# Patient Record
Sex: Female | Born: 1980 | Race: Black or African American | Hispanic: No | Smoking: Never smoker
Health system: Southern US, Community
[De-identification: ages and names within clinical notes are randomized; demographics above are authoritative.]

## PROBLEM LIST (undated history)

## (undated) DIAGNOSIS — I890 Lymphedema, not elsewhere classified: Secondary | ICD-10-CM

## (undated) DIAGNOSIS — I10 Essential (primary) hypertension: Secondary | ICD-10-CM

## (undated) HISTORY — PX: NO PAST SURGERIES: SHX2092

---

## 1998-07-01 ENCOUNTER — Emergency Department (HOSPITAL_COMMUNITY): Admission: EM | Admit: 1998-07-01 | Discharge: 1998-07-01 | Payer: Self-pay | Admitting: Emergency Medicine

## 1998-07-10 ENCOUNTER — Emergency Department (HOSPITAL_COMMUNITY): Admission: EM | Admit: 1998-07-10 | Discharge: 1998-07-10 | Payer: Self-pay | Admitting: Emergency Medicine

## 2001-02-06 ENCOUNTER — Emergency Department (HOSPITAL_COMMUNITY): Admission: EM | Admit: 2001-02-06 | Discharge: 2001-02-06 | Payer: Self-pay | Admitting: Emergency Medicine

## 2001-02-06 ENCOUNTER — Encounter: Payer: Self-pay | Admitting: Emergency Medicine

## 2003-02-24 ENCOUNTER — Encounter: Admission: RE | Admit: 2003-02-24 | Discharge: 2003-02-24 | Payer: Self-pay | Admitting: Internal Medicine

## 2003-02-24 ENCOUNTER — Ambulatory Visit (HOSPITAL_COMMUNITY): Admission: RE | Admit: 2003-02-24 | Discharge: 2003-02-24 | Payer: Self-pay | Admitting: Internal Medicine

## 2003-02-24 ENCOUNTER — Encounter: Payer: Self-pay | Admitting: Internal Medicine

## 2003-07-16 ENCOUNTER — Other Ambulatory Visit: Admission: RE | Admit: 2003-07-16 | Discharge: 2003-07-16 | Payer: Self-pay | Admitting: Obstetrics and Gynecology

## 2003-12-24 ENCOUNTER — Emergency Department (HOSPITAL_COMMUNITY): Admission: EM | Admit: 2003-12-24 | Discharge: 2003-12-24 | Payer: Self-pay | Admitting: Emergency Medicine

## 2004-10-12 ENCOUNTER — Emergency Department (HOSPITAL_COMMUNITY): Admission: EM | Admit: 2004-10-12 | Discharge: 2004-10-12 | Payer: Self-pay | Admitting: Emergency Medicine

## 2004-10-15 ENCOUNTER — Emergency Department (HOSPITAL_COMMUNITY): Admission: EM | Admit: 2004-10-15 | Discharge: 2004-10-15 | Payer: Self-pay | Admitting: Emergency Medicine

## 2004-10-28 ENCOUNTER — Ambulatory Visit: Payer: Self-pay | Admitting: Family Medicine

## 2005-04-21 ENCOUNTER — Emergency Department (HOSPITAL_COMMUNITY): Admission: EM | Admit: 2005-04-21 | Discharge: 2005-04-21 | Payer: Self-pay | Admitting: Emergency Medicine

## 2005-12-30 ENCOUNTER — Emergency Department (HOSPITAL_COMMUNITY): Admission: EM | Admit: 2005-12-30 | Discharge: 2005-12-30 | Payer: Self-pay | Admitting: *Deleted

## 2006-04-13 ENCOUNTER — Emergency Department (HOSPITAL_COMMUNITY): Admission: EM | Admit: 2006-04-13 | Discharge: 2006-04-13 | Payer: Self-pay | Admitting: Emergency Medicine

## 2006-05-31 ENCOUNTER — Inpatient Hospital Stay (HOSPITAL_COMMUNITY): Admission: AD | Admit: 2006-05-31 | Discharge: 2006-05-31 | Payer: Self-pay | Admitting: Obstetrics & Gynecology

## 2008-01-14 ENCOUNTER — Emergency Department (HOSPITAL_COMMUNITY): Admission: EM | Admit: 2008-01-14 | Discharge: 2008-01-14 | Payer: Self-pay | Admitting: Emergency Medicine

## 2008-01-31 ENCOUNTER — Emergency Department (HOSPITAL_COMMUNITY): Admission: EM | Admit: 2008-01-31 | Discharge: 2008-02-01 | Payer: Self-pay | Admitting: Emergency Medicine

## 2008-04-23 ENCOUNTER — Emergency Department (HOSPITAL_COMMUNITY): Admission: EM | Admit: 2008-04-23 | Discharge: 2008-04-23 | Payer: Self-pay | Admitting: Family Medicine

## 2009-02-03 ENCOUNTER — Encounter: Admission: RE | Admit: 2009-02-03 | Discharge: 2009-02-03 | Payer: Self-pay | Admitting: Pulmonary Disease

## 2010-09-01 ENCOUNTER — Emergency Department (HOSPITAL_COMMUNITY)
Admission: EM | Admit: 2010-09-01 | Discharge: 2010-09-01 | Payer: Self-pay | Source: Home / Self Care | Admitting: Emergency Medicine

## 2011-02-01 LAB — URINALYSIS, ROUTINE W REFLEX MICROSCOPIC
Bilirubin Urine: NEGATIVE
Glucose, UA: NEGATIVE mg/dL
Ketones, ur: NEGATIVE mg/dL
Nitrite: NEGATIVE
Protein, ur: NEGATIVE mg/dL
Specific Gravity, Urine: 1.025 (ref 1.005–1.030)
Urobilinogen, UA: 1 mg/dL (ref 0.0–1.0)
pH: 6 (ref 5.0–8.0)

## 2011-02-01 LAB — URINE MICROSCOPIC-ADD ON

## 2011-05-02 ENCOUNTER — Emergency Department (HOSPITAL_COMMUNITY): Payer: Self-pay

## 2011-05-02 ENCOUNTER — Emergency Department (HOSPITAL_COMMUNITY)
Admission: EM | Admit: 2011-05-02 | Discharge: 2011-05-02 | Disposition: A | Discharge status: Home / Self Care | Payer: Self-pay | Attending: Emergency Medicine | Admitting: Emergency Medicine

## 2011-05-02 DIAGNOSIS — M25473 Effusion, unspecified ankle: Secondary | ICD-10-CM | POA: Insufficient documentation

## 2011-05-02 DIAGNOSIS — M25476 Effusion, unspecified foot: Secondary | ICD-10-CM | POA: Insufficient documentation

## 2011-05-02 DIAGNOSIS — M25579 Pain in unspecified ankle and joints of unspecified foot: Secondary | ICD-10-CM | POA: Insufficient documentation

## 2011-05-02 DIAGNOSIS — M79609 Pain in unspecified limb: Secondary | ICD-10-CM | POA: Insufficient documentation

## 2011-05-02 DIAGNOSIS — L988 Other specified disorders of the skin and subcutaneous tissue: Secondary | ICD-10-CM | POA: Insufficient documentation

## 2011-08-14 LAB — RAPID STREP SCREEN (MED CTR MEBANE ONLY): Streptococcus, Group A Screen (Direct): NEGATIVE

## 2012-02-28 ENCOUNTER — Emergency Department (HOSPITAL_COMMUNITY)
Admission: EM | Admit: 2012-02-28 | Discharge: 2012-02-28 | Disposition: A | Discharge status: Home / Self Care | Payer: Self-pay | Attending: Emergency Medicine | Admitting: Emergency Medicine

## 2012-02-28 ENCOUNTER — Encounter (HOSPITAL_COMMUNITY): Payer: Self-pay | Admitting: *Deleted

## 2012-02-28 DIAGNOSIS — I10 Essential (primary) hypertension: Secondary | ICD-10-CM | POA: Insufficient documentation

## 2012-02-28 DIAGNOSIS — R51 Headache: Secondary | ICD-10-CM | POA: Insufficient documentation

## 2012-02-28 MED ORDER — HYDROCHLOROTHIAZIDE 25 MG PO TABS: 25.0000 mg | ORAL_TABLET | Freq: Every day | ORAL | Status: DC

## 2012-02-28 NOTE — ED Notes (Signed)
Unable to get labs. RN to attempt.

## 2012-02-28 NOTE — ED Notes (Signed)
Pt states "high blood pressure runs in my family, I was having like pressure in my forehead and dizziness"; pt denies dizziness @ present.

## 2012-02-28 NOTE — ED Notes (Addendum)
Pt labs shown to F.C. Sanford PA.

## 2012-02-28 NOTE — Discharge Instructions (Signed)
Hypertension Information As your heart beats, it forces blood through your arteries. This force is your blood pressure. If the pressure is too high, it is called hypertension (HTN) or high blood pressure. HTN is dangerous because you may have it and not know it. High blood pressure may mean that your heart has to work harder to pump blood. Your arteries may be narrow or stiff. The extra work puts you at risk for heart disease, stroke, and other problems.  Blood pressure consists of two numbers, a higher number over a lower, 110/72, for example. It is stated as "110 over 72." The ideal is below 120 for the top number (systolic) and under 80 for the bottom (diastolic).  You should pay close attention to your blood pressure if you have certain conditions such as:  Heart failure.   Prior heart attack.   Diabetes   Chronic kidney disease.   Prior stroke.   Multiple risk factors for heart disease.  To see if you have HTN, your blood pressure should be measured while you are seated with your arm held at the level of the heart. It should be measured at least twice. A one-time elevated blood pressure reading (especially in the Emergency Department) does not mean that you need treatment. There may be conditions in which the blood pressure is different between your right and left arms. It is important to see your caregiver soon for a recheck. Most people have essential hypertension which means that there is not a specific cause. This type of high blood pressure may be lowered by changing lifestyle factors such as:  Stress.   Smoking.   Lack of exercise.   Excessive weight.   Drug/tobacco/alcohol use.   Eating less salt.  Most people do not have symptoms from high blood pressure until it has caused damage to the body. Effective treatment can often prevent, delay or reduce that damage. TREATMENT  Treatment for high blood pressure, when a cause has been identified, is directed at the cause. There  are a large number of medications to treat HTN. These fall into several categories, and your caregiver will help you select the medicines that are best for you. Medications may have side effects. You should review side effects with your caregiver. If your blood pressure stays high after you have made lifestyle changes or started on medicines,   Your medication(s) may need to be changed.   Other problems may need to be addressed.   Be certain you understand your prescriptions, and know how and when to take your medicine.   Be sure to follow up with your caregiver within the time frame advised (usually within two weeks) to have your blood pressure rechecked and to review your medications.   If you are taking more than one medicine to lower your blood pressure, make sure you know how and at what times they should be taken. Taking two medicines at the same time can result in blood pressure that is too low.  Document Released: 01/09/2006 Document Revised: 07/19/2011 Document Reviewed: 01/16/2008 ExitCare Patient Information 2012 [employer]. 

## 2012-02-29 LAB — POCT I-STAT, CHEM 8
BUN: 13 mg/dL (ref 6–23)
Calcium, Ion: 1.01 mmol/L — ABNORMAL LOW (ref 1.12–1.32)
Chloride: 106 mEq/L (ref 96–112)
Creatinine, Ser: 0.8 mg/dL (ref 0.50–1.10)
Glucose, Bld: 81 mg/dL (ref 70–99)
HCT: 43 % (ref 36.0–46.0)
Hemoglobin: 14.6 g/dL (ref 12.0–15.0)
Potassium: 7.8 mEq/L (ref 3.5–5.1)
Sodium: 137 mEq/L (ref 135–145)
TCO2: 28 mmol/L (ref 0–100)

## 2012-02-29 NOTE — ED Provider Notes (Signed)
History     CSN: [id number]  Arrival date & time 02/28/12  1654   First MD Initiated Contact with Patient 02/28/12 1956      Chief Complaint  Patient presents with  . Headache    (Consider location/radiation/quality/duration/timing/severity/associated sxs/prior treatment) HPI Comments: Patient with a family history of HTN presents with concerns for elevated blood pressure - states that for the past week or so she has been having occasional episodes of dizziness and headache - none of these are present now - she states that she has an appointment with [facility] at [facility] in the next two weeks but became concerned today because of this - she denies chest pain, shortness of breath, - states no other history besides morbid obesity.  Patient is a 31 y.o. female presenting with headaches. The history is provided by the patient. No language interpreter was used.  Headache  This is a new problem. The current episode started 2 days ago. The problem occurs every few hours. The problem has been resolved. The headache is associated with nothing. The pain is located in the frontal region. The pain is at a severity of 0/10. The patient is experiencing no pain. The pain does not radiate. Pertinent negatives include no anorexia, no fever, no malaise/fatigue, no chest pressure, no near-syncope, no orthopnea, no palpitations, no syncope, no shortness of breath, no nausea and no vomiting. She has tried nothing for the symptoms. The treatment provided no relief.    History reviewed. No pertinent past medical history.  History reviewed. No pertinent past surgical history.  No family history on file.  History  Substance Use Topics  . Smoking status: Never Smoker   . Smokeless tobacco: Not on file  . Alcohol Use: No    OB History    Grav Para Term Preterm Abortions TAB SAB Ect Mult Living                  Review of Systems  Constitutional: Negative for fever and malaise/fatigue.  Respiratory: Negative  for shortness of breath.   Cardiovascular: Negative for palpitations, orthopnea, syncope and near-syncope.  Gastrointestinal: Negative for nausea, vomiting and anorexia.  Neurological: Positive for headaches.  All other systems reviewed and are negative.    Allergies  Review of patient's allergies indicates no known allergies.  Home Medications   Current Outpatient Rx  Name Route Sig Dispense Refill  . IBUPROFEN 200 MG PO TABS Oral Take 600 mg by mouth every 8 (eight) hours as needed. For pain.    Marland Kitchen HYDROCHLOROTHIAZIDE 25 MG PO TABS Oral Take 1 tablet (25 mg total) by mouth daily. 30 tablet 0    BP 147/99  Pulse 85  Temp(Src) 98.3 F (36.8 C) (Oral)  Resp 16  Wt 486 lb (220.448 kg)  SpO2 96%  LMP 02/02/2012  Physical Exam  Nursing note and vitals reviewed. Constitutional: She is oriented to person, place, and time. She appears well-developed and well-nourished. No distress.  HENT:  Head: Normocephalic and atraumatic.  Right Ear: External ear normal.  Left Ear: External ear normal.  Nose: Nose normal.  Mouth/Throat: Oropharynx is clear and moist. No oropharyngeal exudate.  Eyes: Conjunctivae are normal. Pupils are equal, round, and reactive to light. No scleral icterus.  Neck: Normal range of motion. Neck supple.  Cardiovascular: Normal rate, regular rhythm and normal heart sounds.  Exam reveals no gallop and no friction rub.   No murmur heard. Pulmonary/Chest: Effort normal and breath sounds normal. No respiratory distress. She  has no wheezes. She has no rales. She exhibits no tenderness.  Abdominal: Soft. Bowel sounds are normal. She exhibits no distension. There is no tenderness.  Musculoskeletal: Normal range of motion. She exhibits no edema and no tenderness.  Lymphadenopathy:    She has no cervical adenopathy.  Neurological: She is alert and oriented to person, place, and time. No cranial nerve deficit.  Skin: Skin is warm and dry. No rash noted. No erythema. No  pallor.  Psychiatric: She has a normal mood and affect. Her behavior is normal. Judgment and thought content normal.    ED Course  Procedures (including critical care time)  Labs Reviewed  POCT I-STAT, CHEM 8 - Abnormal; Notable for the following:    Potassium 7.8 (*)    Calcium, Ion 1.01 (*)    All other components within normal limits  POTASSIUM   No results found. Results for orders placed during the hospital encounter of 02/28/12  POCT I-STAT, CHEM 8      Component Value Range   Sodium 137  135 - 145 (mEq/L)   Potassium 7.8 (*) 3.5 - 5.1 (mEq/L)   Chloride 106  96 - 112 (mEq/L)   BUN 13  6 - 23 (mg/dL)   Creatinine, Ser 5.78  0.50 - 1.10 (mg/dL)   Glucose, Bld 81  70 - 99 (mg/dL)   Calcium, Ion 4.69 (*) 1.12 - 1.32 (mmol/L)   TCO2 28  0 - 100 (mmol/L)   Hemoglobin 14.6  12.0 - 15.0 (g/dL)   HCT 62.9  52.8 - 41.3 (%)   Comment NOTIFIED PHYSICIAN    POTASSIUM      Component Value Range   Potassium 4.0  3.5 - 5.1 (mEq/L)   No results found.    1. Hypertension       MDM  Patient with hypertension here after having headaches and dizziness.  I have drawn a baseline BMP - the first one drawn showed a K of 7.8 but this was likely hemolyzed so we repeated this with a normal number.  As she has follow up with the [facility] in several weeks, I have started her on HCTZ and they can adjust this if needed.  Blood glucose here was normal though the patient is morbidly obese.  She is strongly encouraged to keep her appointment with them for regular health mainenance.        Izola Price [place], [place] 02/29/12 [phone]

## 2012-02-29 NOTE — ED Provider Notes (Signed)
Medical screening examination/treatment/procedure(s) were performed by non-physician practitioner and as supervising physician I was immediately available for consultation/collaboration.   Reilley Latorre, MD 02/29/12 1654 

## 2012-09-20 ENCOUNTER — Emergency Department (HOSPITAL_COMMUNITY)
Admission: EM | Admit: 2012-09-20 | Discharge: 2012-09-20 | Discharge status: Against Medical Advice | Payer: Self-pay | Attending: Emergency Medicine | Admitting: Emergency Medicine

## 2012-09-20 DIAGNOSIS — R109 Unspecified abdominal pain: Secondary | ICD-10-CM | POA: Insufficient documentation

## 2012-09-20 DIAGNOSIS — R1110 Vomiting, unspecified: Secondary | ICD-10-CM | POA: Insufficient documentation

## 2013-01-15 ENCOUNTER — Inpatient Hospital Stay (HOSPITAL_COMMUNITY)
Admission: AD | Admit: 2013-01-15 | Discharge: 2013-01-15 | Disposition: A | Discharge status: Home / Self Care | Payer: Self-pay | Source: Ambulatory Visit | Attending: Obstetrics & Gynecology | Admitting: Obstetrics & Gynecology

## 2013-01-15 ENCOUNTER — Encounter (HOSPITAL_COMMUNITY): Payer: Self-pay

## 2013-01-15 DIAGNOSIS — N390 Urinary tract infection, site not specified: Secondary | ICD-10-CM | POA: Insufficient documentation

## 2013-01-15 DIAGNOSIS — N949 Unspecified condition associated with female genital organs and menstrual cycle: Secondary | ICD-10-CM | POA: Insufficient documentation

## 2013-01-15 DIAGNOSIS — N760 Acute vaginitis: Secondary | ICD-10-CM

## 2013-01-15 DIAGNOSIS — B9689 Other specified bacterial agents as the cause of diseases classified elsewhere: Secondary | ICD-10-CM

## 2013-01-15 DIAGNOSIS — A499 Bacterial infection, unspecified: Secondary | ICD-10-CM | POA: Insufficient documentation

## 2013-01-15 DIAGNOSIS — M545 Low back pain, unspecified: Secondary | ICD-10-CM | POA: Insufficient documentation

## 2013-01-15 HISTORY — DX: Essential (primary) hypertension: I10

## 2013-01-15 LAB — URINALYSIS, ROUTINE W REFLEX MICROSCOPIC
Bilirubin Urine: NEGATIVE
Glucose, UA: NEGATIVE mg/dL
Ketones, ur: NEGATIVE mg/dL
Leukocytes, UA: NEGATIVE
Nitrite: NEGATIVE
Protein, ur: NEGATIVE mg/dL
Specific Gravity, Urine: 1.01 (ref 1.005–1.030)
Urobilinogen, UA: 0.2 mg/dL (ref 0.0–1.0)
pH: 6 (ref 5.0–8.0)

## 2013-01-15 LAB — URINE MICROSCOPIC-ADD ON

## 2013-01-15 LAB — WET PREP, GENITAL
Trich, Wet Prep: NONE SEEN
Yeast Wet Prep HPF POC: NONE SEEN

## 2013-01-15 LAB — POCT PREGNANCY, URINE: Preg Test, Ur: NEGATIVE

## 2013-01-15 MED ORDER — METRONIDAZOLE 500 MG PO TABS: 500.0000 mg | ORAL_TABLET | Freq: Two times a day (BID) | ORAL | Status: DC

## 2013-01-15 MED ORDER — CIPROFLOXACIN HCL 500 MG PO TABS: 500.0000 mg | ORAL_TABLET | Freq: Two times a day (BID) | ORAL | Status: DC

## 2013-01-15 NOTE — MAU Note (Signed)
Pt reports that since 5pm today she has had pressure in her vaginal area. States her urine "smells bad", now also reports lower back pain.

## 2013-01-15 NOTE — MAU Provider Note (Signed)
Chief Complaint: Back Pain and Vaginal Pain   First Provider Initiated Contact with Patient 01/15/13 2228      SUBJECTIVE HPI: [name] is a 32 y.o. G0P0 non-pregnant female who presents with pulling sensation in her clitoris since 1700 today, foul smelling urine and right low back soreness and tightness. Last IC December 2014. Denies any injury or contact w/ clitoris.   Past Medical History  Diagnosis Date  . Hypertension        OB History   Grav Para Term Preterm Abortions TAB SAB Ect Mult Living   0              History reviewed. No pertinent past surgical history. History   Social History  . Marital Status: Single    Spouse Name: N/A    Number of Children: N/A  . Years of Education: N/A   Occupational History  . Not on file.   Social History Main Topics  . Smoking status: Never Smoker   . Smokeless tobacco: Not on file  . Alcohol Use: No  . Drug Use: No  . Sexually Active: Yes    Birth Control/ Protection: None   Other Topics Concern  . Not on file   Social History Narrative  . No narrative on file   No current facility-administered medications on file prior to encounter.   Current Outpatient Prescriptions on File Prior to Encounter  Medication Sig Dispense Refill  . ibuprofen (ADVIL,MOTRIN) 200 MG tablet Take 600 mg by mouth every 8 (eight) hours as needed. For pain.      . hydrochlorothiazide (HYDRODIURIL) 25 MG tablet Take 1 tablet (25 mg total) by mouth daily.  30 tablet  0   No Known Allergies  ROS: Pos for frequency of urination, neg for urgency, flank pain, fever, chills, vaginal discharge, abd pain.   OBJECTIVE Blood pressure 124/86, pulse 95, temperature 98.4 F (36.9 C), temperature source Oral, resp. rate 20, height 6\' 1"  (1.854 m), weight 231.334 kg (510 lb), last menstrual period 12/04/2012, SpO2 97.00%. GENERAL: Well-developed, morbidly obese female in no acute distress.  HEENT: Normocephalic HEART: normal rate RESP: normal  effort ABDOMEN: Soft, non-tender BACK: Mild right low back tenderness. No CVAT.  EXTREMITIES: Nontender, 2+ LE edema and thickened skin around lower calves, ankles.  NEURO: Alert and oriented SPECULUM EXAM: Exam limited by large panis and mons pubis. Clitoris NT, normal color, no swelling, erythema, drainage. Scant amount of blood noted at vaginal opening. No lesions on external genitalia. Scant amount of pink vaginal bleeding coming from os. Small amount of malodorous discharge, cervix clean, but incompletely visualised. BIMANUAL: Unable to reach cervix or palpate uterus due to body habitus. No adnexal tenderness or masses.  LAB RESULTS Results for orders placed during the hospital encounter of 01/15/13 (from the past 24 hour(s))  URINALYSIS, ROUTINE W REFLEX MICROSCOPIC     Status: Abnormal   Collection Time    01/15/13  8:09 PM      Result Value Range   Color, Urine YELLOW  YELLOW   APPearance CLEAR  CLEAR   Specific Gravity, Urine 1.010  1.005 - 1.030   pH 6.0  5.0 - 8.0   Glucose, UA NEGATIVE  NEGATIVE mg/dL   Hgb urine dipstick LARGE (*) NEGATIVE   Bilirubin Urine NEGATIVE  NEGATIVE   Ketones, ur NEGATIVE  NEGATIVE mg/dL   Protein, ur NEGATIVE  NEGATIVE mg/dL   Urobilinogen, UA 0.2  0.0 - 1.0 mg/dL   Nitrite NEGATIVE  NEGATIVE   Leukocytes, UA NEGATIVE  NEGATIVE  URINE MICROSCOPIC-ADD ON     Status: Abnormal   Collection Time    01/15/13  8:09 PM      Result Value Range   Squamous Epithelial / LPF FEW (*) RARE   WBC, UA 0-2  <3 WBC/hpf   RBC / HPF 3-6  <3 RBC/hpf   Bacteria, UA FEW (*) RARE  POCT PREGNANCY, URINE     Status: None   Collection Time    01/15/13  8:24 PM      Result Value Range   Preg Test, Ur NEGATIVE  NEGATIVE  WET PREP, GENITAL     Status: Abnormal   Collection Time    01/15/13 10:46 PM      Result Value Range   Yeast Wet Prep HPF POC NONE SEEN  NONE SEEN   Trich, Wet Prep NONE SEEN  NONE SEEN   Clue Cells Wet Prep HPF POC FEW (*) NONE SEEN   WBC,  Wet Prep HPF POC FEW (*) NONE SEEN    IMAGING No results found.  MAU COURSE  ASSESSMENT 1. BV (bacterial vaginosis)   2. UTI (urinary tract infection)    PLAN Discharge home Increase water intake.  Take probiotics while on ABX.  Does not have PCP or Gyn. Lists of providers given.  Follow-up Information   Follow up with [facility] [place] [facility]. (As needed if symptoms worsen)    Contact information:   [address] [id number] [place] [address] [phone]      Follow up with Gynecologist. (for routine care)         Medication List    TAKE these medications       ciprofloxacin 500 MG tablet  Commonly known as:  CIPRO  Take 1 tablet (500 mg total) by mouth 2 (two) times daily.     hydrochlorothiazide 25 MG tablet  Commonly known as:  HYDRODIURIL  Take 1 tablet (25 mg total) by mouth daily.     ibuprofen 200 MG tablet  Commonly known as:  ADVIL,MOTRIN  Take 600 mg by mouth every 8 (eight) hours as needed. For pain.     metroNIDAZOLE 500 MG tablet  Commonly known as:  FLAGYL  Take 1 tablet (500 mg total) by mouth 2 (two) times daily.       [place], [place] 01/15/2013  11:23 PM

## 2013-01-16 LAB — GC/CHLAMYDIA PROBE AMP
CT Probe RNA: NEGATIVE
GC Probe RNA: NEGATIVE

## 2013-01-17 LAB — URINE CULTURE
Colony Count: 80000
Special Requests: NORMAL

## 2013-03-26 ENCOUNTER — Encounter (HOSPITAL_COMMUNITY): Payer: Self-pay | Admitting: Emergency Medicine

## 2013-03-26 ENCOUNTER — Emergency Department (HOSPITAL_COMMUNITY)
Admission: EM | Admit: 2013-03-26 | Discharge: 2013-03-26 | Disposition: A | Discharge status: Home / Self Care | Payer: Self-pay | Attending: Emergency Medicine | Admitting: Emergency Medicine

## 2013-03-26 DIAGNOSIS — M5380 Other specified dorsopathies, site unspecified: Secondary | ICD-10-CM | POA: Insufficient documentation

## 2013-03-26 DIAGNOSIS — Z79899 Other long term (current) drug therapy: Secondary | ICD-10-CM | POA: Insufficient documentation

## 2013-03-26 DIAGNOSIS — I10 Essential (primary) hypertension: Secondary | ICD-10-CM | POA: Insufficient documentation

## 2013-03-26 DIAGNOSIS — M62830 Muscle spasm of back: Secondary | ICD-10-CM

## 2013-03-26 HISTORY — DX: Morbid (severe) obesity due to excess calories: E66.01

## 2013-03-26 MED ORDER — METHOCARBAMOL 500 MG PO TABS: 500.0000 mg | ORAL_TABLET | Freq: Two times a day (BID) | ORAL | Status: DC | PRN

## 2013-03-26 NOTE — ED Provider Notes (Signed)
History     CSN: [id number]  Arrival date & time 03/26/13  1152   First MD Initiated Contact with Patient 03/26/13 1244      Chief Complaint  Patient presents with  . Back Pain    6 hr hx of low back pain    (Consider location/radiation/quality/duration/timing/severity/associated sxs/prior treatment) HPI Comments: Patient presents to the ED for muscle spasms of low back.  States there is pain and tightness in her low back that have been present since approximately 5 AM this morning.  Denies any recent injury or trauma.  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  Denies any numbness or paresthesias of lower extremities. Denies any loss of bowel or bladder function. States she gets muscle spasms occasionally, last incident approximately 2 years ago. Patient does not have a primary care physician at this time.  Has not tried taking any medications for her sx.  The history is provided by the patient.    Past Medical History  Diagnosis Date  . Hypertension   . Morbid obesity     History reviewed. No pertinent past surgical history.  Family History  Problem Relation Age of Onset  . Renal Disease Mother     History  Substance Use Topics  . Smoking status: Never Smoker   . Smokeless tobacco: Not on file  . Alcohol Use: No    OB History   Grav Para Term Preterm Abortions TAB SAB Ect Mult Living   0               Review of Systems  Musculoskeletal: Positive for back pain.  All other systems reviewed and are negative.    Allergies  Review of patient's allergies indicates no known allergies.  Home Medications   Current Outpatient Rx  Name  Route  Sig  Dispense  Refill  . ciprofloxacin (CIPRO) 500 MG tablet   Oral   Take 1 tablet (500 mg total) by mouth 2 (two) times daily.   6 tablet   0   . EXPIRED: hydrochlorothiazide (HYDRODIURIL) 25 MG tablet   Oral   Take 1 tablet (25 mg total) by mouth daily.   30 tablet   0   . ibuprofen  (ADVIL,MOTRIN) 200 MG tablet   Oral   Take 600 mg by mouth every 8 (eight) hours as needed. For pain.         . metroNIDAZOLE (FLAGYL) 500 MG tablet   Oral   Take 1 tablet (500 mg total) by mouth 2 (two) times daily.   14 tablet   0     BP 128/79  Pulse 101  Temp(Src) 98.1 F (36.7 C) (Oral)  Resp 20  Wt 520 lb (235.87 kg)  BMI 68.62 kg/m2  SpO2 97%  LMP 11/20/2012  Physical Exam  Nursing note and vitals reviewed. Constitutional: She is oriented to person, place, and time.  Morbidly obese  HENT:  Head: Normocephalic and atraumatic.  Mouth/Throat: Oropharynx is clear and moist.  Eyes: Conjunctivae and EOM are normal.  Neck: Normal range of motion. Neck supple.  Cardiovascular: Normal rate, regular rhythm and normal heart sounds.   Pulmonary/Chest: Effort normal and breath sounds normal. No respiratory distress.  Musculoskeletal: Normal range of motion.       Lumbar back: She exhibits tenderness, pain and spasm. She exhibits normal range of motion, no bony tenderness, no swelling, no edema, no deformity, no laceration and normal pulse.  Lumbar spine with spasms  bilaterally, distal sensation intact, normal gait  Neurological: She is alert and oriented to person, place, and time.  Skin: Skin is warm and dry.  Psychiatric: She has a normal mood and affect.    ED Course  Procedures (including critical care time)  Labs Reviewed - No data to display No results found.   1. Muscle spasm of back       MDM   Muscle spasms of low back present bilaterally. No recent injury or trauma-imaging deferred at this time. No radiation into lower extremities, numbness, paresthesias or loss of bowel/bladder function. Patient does not have a primary care physician at this time, resource guide given to establish care and schedule FU. Rx Robaxin.  Discussed plan with patient, she agreed. Return precautions advised.        Garlon Hatchet, PA-C 03/26/13 1610  Medical screening  examination/treatment/procedure(s) were performed by non-physician practitioner and as supervising physician I was immediately available for consultation/collaboration.  Derwood Kaplan, MD 03/26/13 [phone]

## 2013-03-26 NOTE — ED Notes (Signed)
Pt reports low back spasms. Denies numbness or tingling in legs. Denies trauma

## 2014-11-25 ENCOUNTER — Encounter (HOSPITAL_COMMUNITY): Payer: Self-pay | Admitting: Emergency Medicine

## 2014-11-25 ENCOUNTER — Emergency Department (HOSPITAL_COMMUNITY)
Admission: EM | Admit: 2014-11-25 | Discharge: 2014-11-25 | Disposition: A | Discharge status: Home / Self Care | Payer: Self-pay | Attending: Emergency Medicine | Admitting: Emergency Medicine

## 2014-11-25 DIAGNOSIS — I10 Essential (primary) hypertension: Secondary | ICD-10-CM | POA: Insufficient documentation

## 2014-11-25 DIAGNOSIS — R609 Edema, unspecified: Secondary | ICD-10-CM

## 2014-11-25 DIAGNOSIS — R000 Tachycardia, unspecified: Secondary | ICD-10-CM | POA: Insufficient documentation

## 2014-11-25 DIAGNOSIS — Z792 Long term (current) use of antibiotics: Secondary | ICD-10-CM | POA: Insufficient documentation

## 2014-11-25 DIAGNOSIS — I739 Peripheral vascular disease, unspecified: Secondary | ICD-10-CM | POA: Insufficient documentation

## 2014-11-25 DIAGNOSIS — L03116 Cellulitis of left lower limb: Secondary | ICD-10-CM | POA: Insufficient documentation

## 2014-11-25 DIAGNOSIS — L03119 Cellulitis of unspecified part of limb: Secondary | ICD-10-CM

## 2014-11-25 DIAGNOSIS — Z79899 Other long term (current) drug therapy: Secondary | ICD-10-CM | POA: Insufficient documentation

## 2014-11-25 LAB — CBC WITH DIFFERENTIAL/PLATELET
Basophils Absolute: 0 10*3/uL (ref 0.0–0.1)
Basophils Relative: 0 % (ref 0–1)
Eosinophils Absolute: 0.1 10*3/uL (ref 0.0–0.7)
Eosinophils Relative: 1 % (ref 0–5)
HCT: 44.6 % (ref 36.0–46.0)
Hemoglobin: 14.1 g/dL (ref 12.0–15.0)
Lymphocytes Relative: 24 % (ref 12–46)
Lymphs Abs: 2.3 10*3/uL (ref 0.7–4.0)
MCH: 31.7 pg (ref 26.0–34.0)
MCHC: 31.6 g/dL (ref 30.0–36.0)
MCV: 100.2 fL — ABNORMAL HIGH (ref 78.0–100.0)
Monocytes Absolute: 0.4 10*3/uL (ref 0.1–1.0)
Monocytes Relative: 5 % (ref 3–12)
Neutro Abs: 6.8 10*3/uL (ref 1.7–7.7)
Neutrophils Relative %: 70 % (ref 43–77)
Platelets: 272 10*3/uL (ref 150–400)
RBC: 4.45 MIL/uL (ref 3.87–5.11)
RDW: 13 % (ref 11.5–15.5)
WBC: 9.7 10*3/uL (ref 4.0–10.5)

## 2014-11-25 LAB — BASIC METABOLIC PANEL
Anion gap: 6 (ref 5–15)
BUN: 12 mg/dL (ref 6–23)
CO2: 30 mmol/L (ref 19–32)
Calcium: 9.3 mg/dL (ref 8.4–10.5)
Chloride: 102 mEq/L (ref 96–112)
Creatinine, Ser: 0.66 mg/dL (ref 0.50–1.10)
GFR calc Af Amer: >90 mL/min (ref >90)
GFR calc non Af Amer: >90 mL/min (ref >90)
Glucose, Bld: 120 mg/dL — ABNORMAL HIGH (ref 70–99)
Potassium: 4.1 mmol/L (ref 3.5–5.1)
Sodium: 138 mmol/L (ref 135–145)

## 2014-11-25 MED ORDER — CEPHALEXIN 500 MG PO CAPS: 500.0000 mg | ORAL_CAPSULE | Freq: Four times a day (QID) | ORAL | Status: DC

## 2014-11-25 NOTE — ED Notes (Addendum)
Pt reports "burning" in RLE x 2 weeks and increased swelling and dryness x "a while."  Sts she has never been seen for either complaint.  Follicular bumps noted to RLE.

## 2014-11-25 NOTE — ED Notes (Signed)
Pt escorted to discharge window. Verbalized understanding discharge instructions. In no acute distress.   

## 2014-11-25 NOTE — ED Notes (Signed)
US at bedside

## 2014-11-25 NOTE — Progress Notes (Signed)
  CARE MANAGEMENT ED NOTE 11/25/2014  Patient:  Marlaine HindLEATH,Baya R   Account Number:  [id number]  Date Initiated:  11/25/2014  Documentation initiated by:  Edd ArbourGIBBS,Godson Pollan  Subjective/Objective Assessment:   34 yr [place] (previously with [employer] in 2015 but has not received an updated 2016 [employer] card at this time)     Subjective/Objective Assessment Detail:   c/o right leg burning at times and states last night a bump burst. Pus from bump was yellow in color per pt.         no pcp per pt           Action/Plan:   see notes below   Action/Plan Detail:   Anticipated DC Date:       Status Recommendation to Physician:   Result of Recommendation:    Other ED Services  Consult Working Plan    DC [employer]  Other  Outpatient Services - Pt will follow up  PCP issues  [facility] / [facility] (established/new)    Choice offered to / List presented to:            Status of service:  Completed, signed off  ED Comments:   ED Comments Detail:  CM spoke with pt who [place]. CM discussed and provided written information for self pay pcps, importance of pcp for f/u care, www.needymeds.org, www.goodrx.com, discounted pharmacies and other [place] resources such as [facility], [facility], affordable care act,  financial assistance, DSS and  health department  Reviewed resources for [place] self pay pcps like [facility], family medicine at [address], [facility], general medical clinics, [facility] urgent care plus others, medication resources, [facility] out patient pharmacies and housing Pt voiced understanding and appreciation of resources provided  Provided [facility] contact information Pt agreed to a referral and [facility] was [facility]  [facility] ED CM spoke with pt on how to obtain an in network pcp with insurance coverage via the customer service number or web site Cm reviewed ED level of care for crisis/emergent services and  community pcp level of care to manage continuous or chronic medical concerns.  The pt voiced understanding CM encouraged pt and discussed pt's responsibility to verify with pt's insurance carrier that any recommended medical provider offered by any emergency room or a hospital provider is within the carrier's network. The pt voiced understanding

## 2014-11-25 NOTE — Progress Notes (Signed)
VASCULAR LAB PRELIMINARY  PRELIMINARY  PRELIMINARY  PRELIMINARY  Right lower extremity venous duplex completed.    Preliminary report:  Right:  No obvious evidence of DVT, superficial thrombosis, or Baker's cyst.   Andris Brothers, RVT 11/25/2014, 11:35 AM

## 2014-11-25 NOTE — Discharge Instructions (Signed)

## 2014-11-25 NOTE — ED Notes (Signed)
Pt c/o right leg burning at times and states last night a bump burst. Pus from bump was yellow in color per pt.

## 2014-11-25 NOTE — ED Provider Notes (Signed)
CSN: [id number]     Arrival date & time 11/25/14  1035 History   First MD Initiated Contact with Patient 11/25/14 1043     Chief Complaint  Patient presents with  . Leg Problem    right     (Consider location/radiation/quality/duration/timing/severity/associated sxs/prior Treatment) HPI Comments: Patient with past medical history of hypertension and morbid obesity presents to the emergency department with chief complaint of right lower extremity swelling, pain, and a mild amount of discharge. Patient states that she noticed increased swelling several weeks ago, but is mostly concerned about a "bump that popped last night" and drained yellow discharge. Patient denies any fevers, chills, chest pain, shortness of breath, nausea, or vomiting. She states that she did not know where to go to have her legs looked at, so she came to the emergency department.  The history is provided by the patient. No language interpreter was used.    Past Medical History  Diagnosis Date  . Hypertension   . Morbid obesity    History reviewed. No pertinent past surgical history. Family History  Problem Relation Age of Onset  . Renal Disease Mother    History  Substance Use Topics  . Smoking status: Never Smoker   . Smokeless tobacco: Not on file  . Alcohol Use: No   OB History    Gravida Para Term Preterm AB TAB SAB Ectopic Multiple Living   0              Review of Systems  Constitutional: Negative for fever and chills.  Respiratory: Negative for shortness of breath.   Cardiovascular: Negative for chest pain.  Gastrointestinal: Negative for nausea, vomiting, diarrhea and constipation.  Genitourinary: Negative for dysuria.  Skin: Positive for color change.  All other systems reviewed and are negative.     Allergies  Review of patient's allergies indicates no known allergies.  Home Medications   Prior to Admission medications   Medication Sig Start Date End Date Taking? Authorizing  Provider  ciprofloxacin (CIPRO) 500 MG tablet Take 1 tablet (500 mg total) by mouth 2 (two) times daily. 01/15/13   Dorathy KinsmanVirginia Smith, CNM  hydrochlorothiazide (HYDRODIURIL) 25 MG tablet Take 1 tablet (25 mg total) by mouth daily. 02/28/12 02/27/13  Izola PriceFrances C. Sanford, PA-C  ibuprofen (ADVIL,MOTRIN) 200 MG tablet Take 600 mg by mouth every 8 (eight) hours as needed. For pain.    Historical Provider, MD  methocarbamol (ROBAXIN) 500 MG tablet Take 1 tablet (500 mg total) by mouth 2 (two) times daily as needed. 03/26/13   Garlon HatchetLisa M Sanders, PA-C  metroNIDAZOLE (FLAGYL) 500 MG tablet Take 1 tablet (500 mg total) by mouth 2 (two) times daily. 01/15/13   Koleen NimrodVirginia Smith, CNM   BP 147/97 mmHg  Pulse 118  Temp(Src) 98.3 F (36.8 C) (Oral)  SpO2 95%  LMP 11/23/2014 (Exact Date) Physical Exam  Constitutional: She is oriented to person, place, and time. She appears well-developed and well-nourished.  HENT:  Head: Normocephalic and atraumatic.  Eyes: Conjunctivae and EOM are normal. Pupils are equal, round, and reactive to light.  Neck: Normal range of motion. Neck supple.  Cardiovascular: Regular rhythm.  Exam reveals no gallop and no friction rub.   No murmur heard. Tachycardic  Pulmonary/Chest: Effort normal and breath sounds normal. No respiratory distress. She has no wheezes. She has no rales. She exhibits no tenderness.  Abdominal: Soft. Bowel sounds are normal. She exhibits no distension and no mass. There is no tenderness. There is no rebound and  no guarding.  Musculoskeletal: Normal range of motion. She exhibits no edema or tenderness.  Very large lower extremities, the right is significantly larger than the left in circumference, moderate tenderness to palpation over the posterior ankle  Neurological: She is alert and oriented to person, place, and time.  Skin: Skin is warm and dry.  Mild erythema and skin changes to the right lower extremity, 1 x 1 cm open wound with some purulent discharge    Psychiatric: She has a normal mood and affect. Her behavior is normal. Judgment and thought content normal.  Nursing note and vitals reviewed.   ED Course  Procedures (including critical care time)      Results for orders placed or performed during the hospital encounter of 11/25/14  CBC with Differential  Result Value Ref Range   WBC 9.7 4.0 - 10.5 K/uL   RBC 4.45 3.87 - 5.11 MIL/uL   Hemoglobin 14.1 12.0 - 15.0 g/dL   HCT 40.9 81.1 - 91.4 %   MCV 100.2 (H) 78.0 - 100.0 fL   MCH 31.7 26.0 - 34.0 pg   MCHC 31.6 30.0 - 36.0 g/dL   RDW 78.2 95.6 - 21.3 %   Platelets 272 150 - 400 K/uL   Neutrophils Relative % 70 43 - 77 %   Neutro Abs 6.8 1.7 - 7.7 K/uL   Lymphocytes Relative 24 12 - 46 %   Lymphs Abs 2.3 0.7 - 4.0 K/uL   Monocytes Relative 5 3 - 12 %   Monocytes Absolute 0.4 0.1 - 1.0 K/uL   Eosinophils Relative 1 0 - 5 %   Eosinophils Absolute 0.1 0.0 - 0.7 K/uL   Basophils Relative 0 0 - 1 %   Basophils Absolute 0.0 0.0 - 0.1 K/uL  Basic metabolic panel  Result Value Ref Range   Sodium 138 135 - 145 mmol/L   Potassium 4.1 3.5 - 5.1 mmol/L   Chloride 102 96 - 112 mEq/L   CO2 30 19 - 32 mmol/L   Glucose, Bld 120 (H) 70 - 99 mg/dL   BUN 12 6 - 23 mg/dL   Creatinine, Ser 0.86 0.50 - 1.10 mg/dL   Calcium 9.3 8.4 - 57.8 mg/dL   GFR calc non Af Amer >90 >90 mL/min   GFR calc Af Amer >90 >90 mL/min   Anion gap 6 5 - 15   No results found.   Imaging Review No results found.   EKG Interpretation None      MDM   Final diagnoses:  PVD (peripheral vascular disease)  Cellulitis of leg without foot    Patient with chronic leg swelling, likely related to peripheral vascular disease. Patient also has a small erythematous area on the back of her right calf, approximately the size of a quarter where there was a prior abscess which drained spontaneously, there is no discharge that is able to be expressed. I will start her on some Keflex for this. Recommend follow-up  with [facility] community health and wellness. Return precautions given. Patient understands and agrees with the plan. She is not in any apparent distress. She is nontoxic in appearance. She is stable and ready for discharge.     Roxy Horseman, PA-C 11/25/14 1253  Raeford Razor, MD 11/25/14 [phone]

## 2014-12-29 ENCOUNTER — Encounter (HOSPITAL_BASED_OUTPATIENT_CLINIC_OR_DEPARTMENT_OTHER): Payer: Self-pay | Attending: General Surgery

## 2014-12-29 DIAGNOSIS — I87331 Chronic venous hypertension (idiopathic) with ulcer and inflammation of right lower extremity: Secondary | ICD-10-CM | POA: Insufficient documentation

## 2014-12-29 DIAGNOSIS — I890 Lymphedema, not elsewhere classified: Secondary | ICD-10-CM | POA: Insufficient documentation

## 2014-12-29 DIAGNOSIS — L97919 Non-pressure chronic ulcer of unspecified part of right lower leg with unspecified severity: Secondary | ICD-10-CM | POA: Insufficient documentation

## 2014-12-30 NOTE — H&P (Signed)
NAMDimple Casey:  Trew, Yovanna                 ACCOUNT NO.:  [id number]  MEDICAL RECORD NO.:  [id number]  LOCATION:  FOOT                         FACILITY:  [facility]  PHYSICIAN:  Joanne Gaveloy Arna Luis, M.D.        DATE OF BIRTH:  Mar 30, 1981  DATE OF ADMISSION:  12/29/2014 DATE OF DISCHARGE:                             HISTORY & PHYSICAL   CHIEF COMPLAINT:  Wound, right leg.  HISTORY OF PRESENT ILLNESS:  This patient developed a draining area in the right posterior leg approximately 1 month ago.  Several months ago, she developed swelling of both legs and changes in the skin which resemble lymphedema.  PAST MEDICAL HISTORY:  Essentially negative.  PAST SURGICAL HISTORY:  Negative.  Cigarettes none.  Alcohol none.  MEDICATIONS:  None.  REVIEW OF SYSTEMS:  She has morbid obesity, and hypertension.  PHYSICAL EXAMINATION:  VITAL SIGNS:  Temperature 97.9, pulse 94, respirations 20, blood pressure 162/16. GENERAL APPEARANCE:  Very obese in no distress. CHEST:  Clear. HEART:  Regular rhythm. EXTREMITIES:  Examination of the extremities reveals lymphedema bilaterally.  Feet are swollen and peripheral pulses are not palpable. ABI is not obtainable.  On the right posterior leg, there is a 1.0 x 1.5 superficial wound which is somewhat moist.  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  PLAN OF TREATMENT:  Silver alginate and Unna boot, and we will order support stockings later.     Joanne Gaveloy Arlyn Bumpus, M.D.    RA/MEDQ  D:  12/29/2014  T:  12/30/2014  Job:  [id number]

## 2015-01-20 ENCOUNTER — Encounter (HOSPITAL_BASED_OUTPATIENT_CLINIC_OR_DEPARTMENT_OTHER): Payer: Self-pay | Attending: Surgery

## 2015-01-20 DIAGNOSIS — I890 Lymphedema, not elsewhere classified: Secondary | ICD-10-CM | POA: Insufficient documentation

## 2015-01-20 DIAGNOSIS — Z6841 Body Mass Index (BMI) 40.0 and over, adult: Secondary | ICD-10-CM | POA: Insufficient documentation

## 2015-01-20 DIAGNOSIS — L97911 Non-pressure chronic ulcer of unspecified part of right lower leg limited to breakdown of skin: Secondary | ICD-10-CM | POA: Insufficient documentation

## 2015-01-20 DIAGNOSIS — I87331 Chronic venous hypertension (idiopathic) with ulcer and inflammation of right lower extremity: Secondary | ICD-10-CM | POA: Insufficient documentation

## 2015-02-24 ENCOUNTER — Encounter (HOSPITAL_BASED_OUTPATIENT_CLINIC_OR_DEPARTMENT_OTHER): Payer: Self-pay | Attending: Surgery

## 2015-02-24 DIAGNOSIS — L97811 Non-pressure chronic ulcer of other part of right lower leg limited to breakdown of skin: Secondary | ICD-10-CM | POA: Insufficient documentation

## 2015-03-24 ENCOUNTER — Encounter (HOSPITAL_BASED_OUTPATIENT_CLINIC_OR_DEPARTMENT_OTHER): Payer: Self-pay | Attending: Surgery

## 2015-03-24 DIAGNOSIS — I872 Venous insufficiency (chronic) (peripheral): Secondary | ICD-10-CM | POA: Insufficient documentation

## 2015-03-24 DIAGNOSIS — L97411 Non-pressure chronic ulcer of right heel and midfoot limited to breakdown of skin: Secondary | ICD-10-CM | POA: Insufficient documentation

## 2015-04-25 ENCOUNTER — Emergency Department (HOSPITAL_COMMUNITY)
Admission: EM | Admit: 2015-04-25 | Discharge: 2015-04-25 | Disposition: A | Discharge status: Home / Self Care | Payer: Self-pay | Attending: Emergency Medicine | Admitting: Emergency Medicine

## 2015-04-25 ENCOUNTER — Encounter (HOSPITAL_COMMUNITY): Payer: Self-pay | Admitting: *Deleted

## 2015-04-25 DIAGNOSIS — Z792 Long term (current) use of antibiotics: Secondary | ICD-10-CM | POA: Insufficient documentation

## 2015-04-25 DIAGNOSIS — Z79899 Other long term (current) drug therapy: Secondary | ICD-10-CM | POA: Insufficient documentation

## 2015-04-25 DIAGNOSIS — L03115 Cellulitis of right lower limb: Secondary | ICD-10-CM | POA: Insufficient documentation

## 2015-04-25 DIAGNOSIS — I10 Essential (primary) hypertension: Secondary | ICD-10-CM | POA: Insufficient documentation

## 2015-04-25 HISTORY — DX: Lymphedema, not elsewhere classified: I89.0

## 2015-04-25 LAB — BASIC METABOLIC PANEL
Anion gap: 9 (ref 5–15)
BUN: 14 mg/dL (ref 6–20)
CO2: 28 mmol/L (ref 22–32)
Calcium: 9.3 mg/dL (ref 8.9–10.3)
Chloride: 101 mmol/L (ref 101–111)
Creatinine, Ser: 0.59 mg/dL (ref 0.44–1.00)
GFR calc Af Amer: >60 mL/min (ref >60)
GFR calc non Af Amer: >60 mL/min (ref >60)
Glucose, Bld: 101 mg/dL — ABNORMAL HIGH (ref 65–99)
Potassium: 5 mmol/L (ref 3.5–5.1)
Sodium: 138 mmol/L (ref 135–145)

## 2015-04-25 LAB — CBC WITH DIFFERENTIAL/PLATELET
Basophils Absolute: 0 10*3/uL (ref 0.0–0.1)
Basophils Relative: 0 % (ref 0–1)
Eosinophils Absolute: 0.1 10*3/uL (ref 0.0–0.7)
Eosinophils Relative: 1 % (ref 0–5)
HCT: 44 % (ref 36.0–46.0)
Hemoglobin: 14.1 g/dL (ref 12.0–15.0)
Lymphocytes Relative: 21 % (ref 12–46)
Lymphs Abs: 1.9 10*3/uL (ref 0.7–4.0)
MCH: 32 pg (ref 26.0–34.0)
MCHC: 32 g/dL (ref 30.0–36.0)
MCV: 99.8 fL (ref 78.0–100.0)
Monocytes Absolute: 0.4 10*3/uL (ref 0.1–1.0)
Monocytes Relative: 4 % (ref 3–12)
Neutro Abs: 6.6 10*3/uL (ref 1.7–7.7)
Neutrophils Relative %: 74 % (ref 43–77)
Platelets: 243 10*3/uL (ref 150–400)
RBC: 4.41 MIL/uL (ref 3.87–5.11)
RDW: 13.3 % (ref 11.5–15.5)
WBC: 9 10*3/uL (ref 4.0–10.5)

## 2015-04-25 MED ORDER — DOXYCYCLINE HYCLATE 100 MG PO TABS
100.0000 mg | ORAL_TABLET | Freq: Once | ORAL | Status: AC
  Administered 2015-04-25: 100 mg via ORAL
  Filled 2015-04-25: qty 1

## 2015-04-25 MED ORDER — DOXYCYCLINE HYCLATE 100 MG PO TABS: 100.0000 mg | ORAL_TABLET | Freq: Two times a day (BID) | ORAL | Status: DC

## 2015-04-25 NOTE — ED Notes (Addendum)
Pt reported having redness, pain and small nodules to rt lower leg. Pt reported noting clear drainage from areas. Rt leg swollen with hx of lymphoedema. No drainage at this time.  Continues to have small scattered nodules. BLE with discoloration.

## 2015-04-25 NOTE — ED Notes (Signed)
Awake. Verbally responsive. A/O x4. Resp even and unlabored. No audible adventitious breath sounds noted. ABC's intact.  

## 2015-04-25 NOTE — Discharge Instructions (Signed)
°Emergency Department Resource Guide °1) Find a Doctor and Pay Out of Pocket °Although you won't have to find out who is covered by your insurance plan, it is a good idea to ask around and get recommendations. You will then need to call the office and see if the doctor you have chosen will accept you as a new patient and what types of options they offer for patients who are self-pay. Some doctors offer discounts or will set up payment plans for their patients who do not have insurance, but you will need to ask so you aren't surprised when you get to your appointment. ° °2) Contact Your Local Health Department °Not all health departments have doctors that can see patients for sick visits, but many do, so it is worth a call to see if yours does. If you don't know where your local health department is, you can check in your phone book. The CDC also has a tool to help you locate your state's health department, and many state websites also have listings of all of their local health departments. ° °3) Find a Walk-in Clinic °If your illness is not likely to be very severe or complicated, you may want to try a walk in clinic. These are popping up all over the country in pharmacies, drugstores, and shopping centers. They're usually staffed by nurse practitioners or physician assistants that have been trained to treat common illnesses and complaints. They're usually fairly quick and inexpensive. However, if you have serious medical issues or chronic medical problems, these are probably not your best option. ° °No Primary Care Doctor: °- Call [facility] Connect at  832-8000 - they can help you locate a primary care doctor that  accepts your insurance, provides certain services, etc. °- Physician Referral Service- 1-800-533-3463 ° °Chronic Pain Problems: °Organization         Address  Phone   Notes  °[facility]  ([phone]) [phone] Patients need to be referred by their primary care doctor.  ° °Medication  Assistance: °Organization         Address  Phone   Notes  °[place] [facility] [address] °[place], [place] [address] (336) 641-8030 --Must be a resident of [place] °-- Must have NO insurance coverage whatsoever (no Medicaid/ Medicare, etc.) °-- The pt. MUST have a primary care doctor that directs their care regularly and follows them in the community °  °[facility]  (866) 331-1348   °[employer]  (888) 892-1162   ° °Agencies that provide inexpensive medical care: °Organization         Address  Phone   Notes  °[facility]  (336) 832-8035   °[facility]    (336) 832-7272   °[facility] [address] °[place], [place] [address] (336) 832-4777   °[facility] [address], °[place] (336) 271-4999   °[facility]    (336) 373-0678   °[facility]    (336) 272-1050   °[facility] ° [address], [place] Phone:  (336) 832-4444, Fax:  (336) 832-4440 Hours of Operation:  9 am - 6 pm, M-F.  Also accepts Medicaid/Medicare and self-pay.  °[facility] ° [address], [place] Phone: (336) [phone], Fax: (336) [phone]. Hours of Operation:  8:30 am - 5:30 pm, M-F.  Also accepts Medicaid and self-pay.  °[facility] [place] [address], [place] Phone: (336) 878-6027   °[facility] [address], [place], [place] (336)723-1848, Ext. 123 Mondays & Thursdays: 7-9 AM.  First 15 patients are seen on a first come, first serve basis. °  ° °Medicaid-accepting [place] Providers: ° °Organization         Address  Phone   Notes  °[facility] [address], [place] (336) [phone] Also accepts self-pay patients.  °[facility] [address], [place] ° (336) 856-9996   °[facility] [address], [place]  (336) 288-8857   °[facility] [address] [place] [address], [place] (336) 299-7000   °Veita Bland [address], [place]  ° (336) 373-1557 Only accepts [place] Access Medicaid patients after they have their name applied to their card.  ° °Self-Pay (no insurance) in [place]: ° °Organization         Address  Phone   Notes  °Sickle Cell Patients, [facility] [address], [place] (336) 832-1970   °[facility] [address], [place] (336) 832-4400   °[facility] ° [address], [place] (336) 992-4800   °[facility]/Dr. Osei-Bonsu ° [address] [place] [address], [place] or [address], [place] (336) [phone] Phone number for both [place] and [place] locations is the same.  °[facility] [address], [place] (336) 299-0000   °[facility] [address] [place] [address], [place] or [address] (336) 852-7530 °(336) 878-2260   °[facility] [address], [place] (336) 350-1642, phone; (336) 294-5005, fax Sees patients 1st and 3rd Saturday of every month.  Must not qualify for public or private insurance (i.e. Medicaid, Medicare, [place] Health Choice, Veterans' Benefits) • Household income should be no more than 200% of the poverty level •The clinic cannot treat you if you are pregnant or think you are pregnant • Sexually transmitted diseases are not treated at the clinic.  ° ° °Dental Care: °Organization         Address  Phone  Notes  °[place] [facility] [address], [place] (336) 641-6152 Accepts children up to age 21 who are enrolled in Medicaid or [place] Health Choice; pregnant women with a Medicaid card; and children who have applied for Medicaid or [place] Health Choice, but were declined, whose parents can pay a reduced fee at time of service.  °[place]  [facility] [place]  [address], [place] (336) 641-7733 Accepts children up to age 21 who are enrolled in Medicaid or [place] Health Choice; pregnant women with a Medicaid card; and children who have applied for Medicaid or [place] Health Choice, but were declined, whose parents can pay a reduced fee at time of service.  °[facility] ° [address], [place] (336) 641-4533 Patients are seen by appointment only. Walk-ins are not accepted. [facility] will see patients 18 years of age and older. °Monday - Tuesday (8am-5pm) °Most Wednesdays (8:30-5pm) °$30 per visit, cash only  °[facility] ° [address], [place] (336) 641-4533 Patients are seen by appointment only. Walk-ins are not accepted. [facility] will see patients 18 years of age and older. °One   Wednesday Evening (Monthly: Volunteer Based).  $30 per visit, cash only  °[facility]  (919) [phone] for adults; Children under age 4, call Graduate Pediatric Dentistry at ([phone]) [phone]. Children aged 4-14, please call ([phone]) [phone] to request a pediatric application. ° Dental services are provided in all areas of dental care including fillings, crowns and bridges, complete and partial dentures, implants, gum treatment, root canals, and extractions. Preventive care is also provided. Treatment is provided to both adults and children. °Patients are selected via a lottery and there is often a waiting list. °  °[facility] [address], °[place] ° (336) 763-8833 [email or web address] °  °[facility] [address], [place], [place] (336)723-1848, Ext. 123 Second and Fourth Thursday of each month, opens at 6:30 AM; Clinic ends at 9 AM.  Patients are seen on a first-come first-served basis, and a limited number are seen during each clinic.  ° °[facility] ° [address], [place], [place] (336) 723-7904    Eligibility Requirements °You must have lived in [place], [place], or [place] counties for at least the last three months. °  You cannot be eligible for state or federal sponsored healthcare insurance, including Veterans Administration, Medicaid, or Medicare. °  You generally cannot be eligible for healthcare insurance through your employer.  °  How to apply: °Eligibility screenings are held every Tuesday and Wednesday afternoon from 1:00 pm until 4:00 pm. You do not need an appointment for the interview!  °[facility] [address], [place], [place] 336-631-2330   °[place] [facility]  336-342-8273   °[place]  336-703-3100   °[facility]  336-570-6415   ° °Behavioral Health Resources in the Community: °Intensive Outpatient Programs °Organization         Address  Phone  Notes  °[facility] [address], [place], [place] 336-878-6098   °[facility] [address], [place], [place] 336-832-9800   °[facility] [address], [place], [place] ° 336-882-2125   °[facility] [address],  °[place], [place] 1-800-853-5163 or 336-641-4981   °Substance Abuse Resources °Organization         Address  Phone  Notes  °[facility]  336-882-2125   °[facility]  336-784-9470   °[facility]  336-285-9073   °[facility]  336-845-3988   °Residential & Outpatient Substance Abuse Program  1-800-659-3381   °Psychological Services °Organization         Address  Phone  Notes  °[facility]  336- 832-9600   °[facility]  336- 378-7881   °[facility] [address], [place] 1-800-853-5163 or 336-641-4981   ° °Mobile Crisis Teams °Organization         Address  Phone  Notes  °[facility], Mobile Crisis Care Unit  1-877-626-1772   °Assertive °[facility] ° [address]  [place], [place] 336-834-9664   °Sharon DeEsch [address] °[place] 336-554-5454   ° °Self-Help/Support Groups °Organization         Address  Phone             Notes  °[facility]  - variety of support groups  336- 373-1402 Call for more information  °Narcotics Anonymous (NA), [facility] [address], °[place]  2 meetings at this location  ° °  Residential Treatment Programs Organization         Address  Phone  Notes  [facility] [address],    [place]  [phone]   [facility]  [address], [place] [phone], [place], [place] [phone]   [facility] [address], [place] [phone]) [phone] Admissions: 8am-3pm M-F  [facility] [address]    [place], [place] [phone]   [facility] [address], [place], [place] [phone]   [facility] [address]  [place], [place] [phone]   [facility] Intensive Outpatient [address], [place], [place] [phone]   [facility] [address]  [place], [place] [phone] or [phone]   [facility] [address] [place], [place] [phone] Accepts Medicaid  [facility] [address]  [place] [phone] Substance Abuse/Addiction Treatment   [facility]         Address  Phone  Notes  [facility]  325-497-7332(888) [phone]   Angie FavaJulie Brannon, PhD [address], [address] [place], [place]   [phone](336) ([phone] or [phone](336) [phone]   [facility]   [address] [place], [place] [phone](336) [phone]   [facility] [address], [place], [place] [phone](336) [phone] Insurance/Medicaid/sponsorship through [facility] [address]                                    [place], [place] [phone](336) [phone] Therapy/tele-psych/case    [facility] [address]   [place], [place] [phone](336) [phone]    Dr. Lolly MustacheArfeen  [phone](336) [phone]   [facility]  [employer] [facility] 1) [address], [place] 2) [address], [place] 3)  [address], [place] [phone](336) [phone](336) [phone]  [phone](336) [phone]   [facility] Child Abuse Hotline [phone](336) ([phone])[phone] or [phone](336) 6805768967 (After Hours)      Take the prescription as directed.  Call the [facility]ound Care Center tomorrow to confirm your previously scheduled appointment in 3 days (Wednesday).  Return to the Emergency Department immediately sooner if worsening.

## 2015-04-25 NOTE — ED Provider Notes (Signed)
CSN: [id number]     Arrival date & time 04/25/15  4098 History   First MD Initiated Contact with Patient 04/25/15 0830     Chief Complaint  Patient presents with  . Leg Pain     HPI  Pt was seen at 0830.  Per pt, c/o gradual onset and persistence of constant right leg "rash" for the past several days. Has been associated with several scattered areas of "oozing clear fluid."  Pt states she has hx of chronic bilat LE's lymphedema and [facility] (has appt there in 3 days). Pt states she has hx of recurrent cellulitis and is concerned regarding same today. Denies fevers, no other areas of rash, no leg pain, no abd pain, no back pain, no focal motor weakness, no tingling/numbness in extremities.   Past Medical History  Diagnosis Date  . Hypertension   . Morbid obesity   . Lymphedema of both lower extremities    History reviewed. No pertinent past surgical history.   Family History  Problem Relation Age of Onset  . Renal Disease Mother    History  Substance Use Topics  . Smoking status: Never Smoker   . Smokeless tobacco: Not on file  . Alcohol Use: No    Review of Systems ROS: Statement: All systems negative except as marked or noted in the HPI; Constitutional: Negative for fever and chills. ; ; Eyes: Negative for eye pain, redness and discharge. ; ; ENMT: Negative for ear pain, hoarseness, nasal congestion, sinus pressure and sore throat. ; ; Cardiovascular: Negative for chest pain, palpitations, diaphoresis, dyspnea and peripheral edema. ; ; Respiratory: Negative for cough, wheezing and stridor. ; ; Gastrointestinal: Negative for nausea, vomiting, diarrhea, abdominal pain, blood in stool, hematemesis, jaundice and rectal bleeding. . ; ; Genitourinary: Negative for dysuria, flank pain and hematuria. ; ; Musculoskeletal: Negative for back pain and neck pain. Negative for swelling and trauma.; ; Skin: +rash. Negative for pruritus, abrasions,  blisters, bruising and skin lesion.; ; Neuro: Negative for headache, lightheadedness and neck stiffness. Negative for weakness, altered level of consciousness , altered mental status, extremity weakness, paresthesias, involuntary movement, seizure and syncope.      Allergies  Review of patient's allergies indicates no known allergies.  Home Medications   Prior to Admission medications   Medication Sig Start Date End Date Taking? Authorizing Provider  bag balm OINT ointment Apply 1 application topically as needed for dry skin or irritation.   Yes Historical Provider, MD  cephALEXin (KEFLEX) 500 MG capsule Take 1 capsule (500 mg total) by mouth 4 (four) times daily. Patient not taking: Reported on 04/25/2015 11/25/14   Roxy Horseman, PA-C  ciprofloxacin (CIPRO) 500 MG tablet Take 1 tablet (500 mg total) by mouth 2 (two) times daily. Patient not taking: Reported on 11/25/2014 01/15/13   Dorathy Kinsman, CNM  hydrochlorothiazide (HYDRODIURIL) 25 MG tablet Take 1 tablet (25 mg total) by mouth daily. Patient not taking: Reported on 04/25/2015 02/28/12 02/27/13  Cherrie Distance, PA-C  methocarbamol (ROBAXIN) 500 MG tablet Take 1 tablet (500 mg total) by mouth 2 (two) times daily as needed. Patient not taking: Reported on 11/25/2014 03/26/13   Garlon Hatchet, PA-C  metroNIDAZOLE (FLAGYL) 500 MG tablet Take 1 tablet (500 mg total) by mouth 2 (two) times daily. Patient not taking: Reported on 11/25/2014 01/15/13   Dorathy Kinsman, CNM   BP 145/79 mmHg  Pulse 128  Temp(Src) 97.5 F (36.4 C) (  Oral)  Resp 20  SpO2 93%  LMP 04/25/2015 Physical Exam  0835: Physical examination:  Nursing notes reviewed; Vital signs and O2 SAT reviewed;  Constitutional: Well developed, Well nourished, Well hydrated, In no acute distress; Head:  Normocephalic, atraumatic; Eyes: EOMI, PERRL, No scleral icterus; ENMT: Mouth and pharynx normal, Mucous membranes moist; Neck: Supple, Full range of motion, No lymphadenopathy; Cardiovascular:  Regular rate and rhythm, No gallop; Respiratory: Breath sounds clear & equal bilaterally, No wheezes.  Speaking full sentences with ease, Normal respiratory effort/excursion; Chest: Nontender, Movement normal; Abdomen: Soft, Nontender, Nondistended, Normal bowel sounds; Genitourinary: No CVA tenderness; Extremities: Pulses normal, No tenderness, +bilat LE's chronic lymphedema with chronic stasis skin changes. No calf asymmetry. +anterior proximal right lower leg with small patchy area of erythema and warmth. No fluctuance, no purulent drainage, no open wounds.; Neuro: AA&Ox3, Major CN grossly intact.  Speech clear. No gross focal motor or sensory deficits in extremities. Climbs on and off stretcher easily by herself. Gait steady.; Skin: Color normal, Warm, Dry.   ED Course  Procedures     EKG Interpretation None      MDM  MDM Reviewed: previous chart, nursing note and vitals Reviewed previous: labs Interpretation: labs      Results for orders placed or performed during the hospital encounter of 04/25/15  Basic metabolic panel  Result Value Ref Range   Sodium 138 135 - 145 mmol/L   Potassium 5.0 3.5 - 5.1 mmol/L   Chloride 101 101 - 111 mmol/L   CO2 28 22 - 32 mmol/L   Glucose, Bld 101 (H) 65 - 99 mg/dL   BUN 14 6 - 20 mg/dL   Creatinine, Ser 1.610.59 0.44 - 1.00 mg/dL   Calcium 9.3 8.9 - 09.610.3 mg/dL   GFR calc non Af Amer >60 >60 mL/min   GFR calc Af Amer >60 >60 mL/min   Anion gap 9 5 - 15  CBC with Differential/Platelet  Result Value Ref Range   WBC 9.0 4.0 - 10.5 K/uL   RBC 4.41 3.87 - 5.11 MIL/uL   Hemoglobin 14.1 12.0 - 15.0 g/dL   HCT 04.544.0 40.936.0 - 81.146.0 %   MCV 99.8 78.0 - 100.0 fL   MCH 32.0 26.0 - 34.0 pg   MCHC 32.0 30.0 - 36.0 g/dL   RDW 91.413.3 78.211.5 - 95.615.5 %   Platelets 243 150 - 400 K/uL   Neutrophils Relative % 74 43 - 77 %   Neutro Abs 6.6 1.7 - 7.7 K/uL   Lymphocytes Relative 21 12 - 46 %   Lymphs Abs 1.9 0.7 - 4.0 K/uL   Monocytes Relative 4 3 - 12 %    Monocytes Absolute 0.4 0.1 - 1.0 K/uL   Eosinophils Relative 1 0 - 5 %   Eosinophils Absolute 0.1 0.0 - 0.7 K/uL   Basophils Relative 0 0 - 1 %   Basophils Absolute 0.0 0.0 - 0.1 K/uL    1110:  Pt remains afebrile, and WBC count normal. Will tx for cellulitis. Pt states she already has an appt [facility] scheduled in 3 days. Pt encouraged to keep same. Pt wants to go home now. Dx and testing d/w pt.  Questions answered.  Verb understanding, agreeable to d/c home with outpt f/u.     Samuel JesterKathleen Evangelos Paulino, DO 04/29/15 1421

## 2015-04-25 NOTE — ED Notes (Addendum)
Pt c/o right lower leg pain, pt goes to wound center for lymphedema in her R leg, reports since last night she developed burning pain and oozing from her leg.

## 2015-04-25 NOTE — ED Notes (Signed)
Awake. Verbally responsive. A/O x4. Resp even and unlabored. No audible adventitious breath sounds noted. ABC's intact. Noted pustulent drainage from nodules on rt lower leg. Pt ambulated to BR with steady gait.

## 2015-07-16 ENCOUNTER — Encounter (HOSPITAL_COMMUNITY): Payer: Self-pay | Admitting: Emergency Medicine

## 2015-07-16 ENCOUNTER — Emergency Department (HOSPITAL_COMMUNITY)
Admission: EM | Admit: 2015-07-16 | Discharge: 2015-07-17 | Disposition: A | Discharge status: Home / Self Care | Payer: Self-pay | Attending: Emergency Medicine | Admitting: Emergency Medicine

## 2015-07-16 DIAGNOSIS — T798XXA Other early complications of trauma, initial encounter: Secondary | ICD-10-CM

## 2015-07-16 DIAGNOSIS — I10 Essential (primary) hypertension: Secondary | ICD-10-CM | POA: Insufficient documentation

## 2015-07-16 DIAGNOSIS — L089 Local infection of the skin and subcutaneous tissue, unspecified: Secondary | ICD-10-CM | POA: Insufficient documentation

## 2015-07-16 LAB — CBC
HCT: 41.1 % (ref 36.0–46.0)
Hemoglobin: 13.4 g/dL (ref 12.0–15.0)
MCH: 32.1 pg (ref 26.0–34.0)
MCHC: 32.6 g/dL (ref 30.0–36.0)
MCV: 98.6 fL (ref 78.0–100.0)
Platelets: 273 10*3/uL (ref 150–400)
RBC: 4.17 MIL/uL (ref 3.87–5.11)
RDW: 13.2 % (ref 11.5–15.5)
WBC: 10.6 10*3/uL — ABNORMAL HIGH (ref 4.0–10.5)

## 2015-07-16 LAB — I-STAT CHEM 8, ED
BUN: 10 mg/dL (ref 6–20)
Calcium, Ion: 1.18 mmol/L (ref 1.12–1.23)
Chloride: 101 mmol/L (ref 101–111)
Creatinine, Ser: 0.7 mg/dL (ref 0.44–1.00)
Glucose, Bld: 93 mg/dL (ref 65–99)
HCT: 44 % (ref 36.0–46.0)
Hemoglobin: 15 g/dL (ref 12.0–15.0)
Potassium: 3.6 mmol/L (ref 3.5–5.1)
Sodium: 143 mmol/L (ref 135–145)
TCO2: 29 mmol/L (ref 0–100)

## 2015-07-16 LAB — COMPREHENSIVE METABOLIC PANEL
ALT: 20 U/L (ref 14–54)
AST: 26 U/L (ref 15–41)
Albumin: 4.2 g/dL (ref 3.5–5.0)
Alkaline Phosphatase: 57 U/L (ref 38–126)
Anion gap: 6 (ref 5–15)
BUN: 10 mg/dL (ref 6–20)
CO2: 31 mmol/L (ref 22–32)
Calcium: 9.4 mg/dL (ref 8.9–10.3)
Chloride: 103 mmol/L (ref 101–111)
Creatinine, Ser: 0.67 mg/dL (ref 0.44–1.00)
GFR calc Af Amer: >60 mL/min (ref >60)
GFR calc non Af Amer: >60 mL/min (ref >60)
Glucose, Bld: 94 mg/dL (ref 65–99)
Potassium: 3.6 mmol/L (ref 3.5–5.1)
Sodium: 140 mmol/L (ref 135–145)
Total Bilirubin: 0.9 mg/dL (ref 0.3–1.2)
Total Protein: 8.1 g/dL (ref 6.5–8.1)

## 2015-07-16 MED ORDER — CEPHALEXIN 500 MG PO CAPS
500.0000 mg | ORAL_CAPSULE | Freq: Once | ORAL | Status: AC
  Administered 2015-07-16: 500 mg via ORAL
  Filled 2015-07-16: qty 1

## 2015-07-16 MED ORDER — SULFAMETHOXAZOLE-TRIMETHOPRIM 800-160 MG PO TABS: 1.0000 | ORAL_TABLET | Freq: Two times a day (BID) | ORAL | Status: AC

## 2015-07-16 MED ORDER — SULFAMETHOXAZOLE-TRIMETHOPRIM 800-160 MG PO TABS
1.0000 | ORAL_TABLET | Freq: Once | ORAL | Status: AC
  Administered 2015-07-16: 1 via ORAL
  Filled 2015-07-16: qty 1

## 2015-07-16 MED ORDER — CEPHALEXIN 500 MG PO CAPS: 500.0000 mg | ORAL_CAPSULE | Freq: Four times a day (QID) | ORAL | Status: DC

## 2015-07-16 NOTE — ED Notes (Signed)
Bed: WA17 Expected date:  Expected time:  Means of arrival:  Comments: t2

## 2015-07-16 NOTE — ED Notes (Signed)
Patient has a wound on the back of right leg with drainage (yellowish green).

## 2015-07-16 NOTE — Discharge Instructions (Signed)

## 2015-07-16 NOTE — ED Provider Notes (Signed)
CSN: [id number]     Arrival date & time 07/16/15  2154 History   First MD Initiated Contact with Patient 07/16/15 2214     Chief Complaint  Patient presents with  . Wound Infection     (Consider location/radiation/quality/duration/timing/severity/associated sxs/prior Treatment) HPI Comments: The patient is a 34 year old female, she has a history of lymphedema of the lower extremities. She reports that over the last week she has developed some drainage from her RLE - this has been seen by PMD 2 months ago and she was told to do dressings to the leg - this has not helped.    She denies fevers chills nausea or vomiting and denies any increased swelling of her legs.  The history is provided by the patient.    Past Medical History  Diagnosis Date  . Hypertension   . Morbid obesity   . Lymphedema of both lower extremities    History reviewed. No pertinent past surgical history. Family History  Problem Relation Age of Onset  . Renal Disease Mother    Social History  Substance Use Topics  . Smoking status: Never Smoker   . Smokeless tobacco: None  . Alcohol Use: No   OB History    Gravida Para Term Preterm AB TAB SAB Ectopic Multiple Living   0              Review of Systems  All other systems reviewed and are negative.     Allergies  Review of patient's allergies indicates no known allergies.  Home Medications   Prior to Admission medications   Medication Sig Start Date End Date Taking? Authorizing Provider  ibuprofen (ADVIL,MOTRIN) 200 MG tablet Take 600 mg by mouth every 6 (six) hours as needed for moderate pain.   Yes Historical Provider, MD  bag balm OINT ointment Apply 1 application topically as needed for dry skin or irritation.    Historical Provider, MD  cephALEXin (KEFLEX) 500 MG capsule Take 1 capsule (500 mg total) by mouth 4 (four) times daily. 07/16/15   Eber Hong, MD  hydrochlorothiazide (HYDRODIURIL) 25 MG tablet Take 1 tablet (25 mg total) by mouth  daily. Patient not taking: Reported on 04/25/2015 02/28/12 02/27/13  Cherrie Distance, PA-C  sulfamethoxazole-trimethoprim (BACTRIM DS,SEPTRA DS) 800-160 MG per tablet Take 1 tablet by mouth 2 (two) times daily. 07/16/15 07/23/15  Eber Hong, MD   BP 158/91 mmHg  Pulse 102  Resp 18  Ht  (1.88 m)  Wt 527 lb (239.046 kg)  BMI 67.63 kg/m2  SpO2 98% Physical Exam  Constitutional: She appears well-developed and well-nourished.  HENT:  Head: Normocephalic and atraumatic.  Eyes: Conjunctivae are normal. Right eye exhibits no discharge. Left eye exhibits no discharge.  Pulmonary/Chest: Effort normal. No respiratory distress.  Musculoskeletal: She exhibits edema and tenderness.  Right lower extremity with severe lymphedema, symmetrical with the left lower extremity however there is mild redness, multiple pedunculated raised lesions laterally distal with a small amount of honey crusted discharge  Neurological: She is alert. Coordination normal.  Skin: Skin is warm and dry. No rash noted. She is not diaphoretic. No erythema.  Psychiatric: She has a normal mood and affect.  Nursing note and vitals reviewed.   ED Course  Procedures (including critical care time) Labs Review Labs Reviewed  CBC - Abnormal; Notable for the following:    WBC 10.6 (*)    All other components within normal limits  CULTURE, BLOOD (ROUTINE X 2)  WOUND CULTURE  COMPREHENSIVE METABOLIC  PANEL  I-STAT CHEM 8, ED    Imaging Review No results found. I have personally reviewed and evaluated these images and lab results as part of my medical decision-making.   EKG Interpretation None      MDM   Final diagnoses:  Wound infection, initial encounter    Findings on physical exam concerning for staphylococcal superinfection of the skin. Given her lymphedema labs were drawn, there is no fever, no tachycardia, no significant leukocytosis, she appears to have infection which can be treated with oral antibiotics and  close outpatient follow-up. Recommended 48-hour follow-up, the patient agrees.  Meds given in ED:  Medications  sulfamethoxazole-trimethoprim (BACTRIM DS,SEPTRA DS) 800-160 MG per tablet 1 tablet (1 tablet Oral Given 07/16/15 2325)  cephALEXin (KEFLEX) capsule 500 mg (500 mg Oral Given 07/16/15 2325)    New Prescriptions   CEPHALEXIN (KEFLEX) 500 MG CAPSULE    Take 1 capsule (500 mg total) by mouth 4 (four) times daily.   SULFAMETHOXAZOLE-TRIMETHOPRIM (BACTRIM DS,SEPTRA DS) 800-160 MG PER TABLET    Take 1 tablet by mouth 2 (two) times daily.        Eber Hong, MD 07/16/15 [phone]

## 2015-07-17 MED ORDER — IBUPROFEN 800 MG PO TABS: 800.0000 mg | ORAL_TABLET | Freq: Once | ORAL | Status: DC

## 2015-07-21 LAB — CULTURE, BLOOD (ROUTINE X 2): Culture: NO GROWTH

## 2015-08-19 ENCOUNTER — Emergency Department (HOSPITAL_COMMUNITY)
Admission: EM | Admit: 2015-08-19 | Discharge: 2015-08-19 | Disposition: A | Discharge status: Home / Self Care | Payer: Self-pay | Attending: Emergency Medicine | Admitting: Emergency Medicine

## 2015-08-19 ENCOUNTER — Encounter (HOSPITAL_COMMUNITY): Payer: Self-pay | Admitting: *Deleted

## 2015-08-19 DIAGNOSIS — L03115 Cellulitis of right lower limb: Secondary | ICD-10-CM | POA: Insufficient documentation

## 2015-08-19 DIAGNOSIS — I10 Essential (primary) hypertension: Secondary | ICD-10-CM | POA: Insufficient documentation

## 2015-08-19 DIAGNOSIS — Z792 Long term (current) use of antibiotics: Secondary | ICD-10-CM | POA: Insufficient documentation

## 2015-08-19 LAB — CBC WITH DIFFERENTIAL/PLATELET
Basophils Absolute: 0 10*3/uL (ref 0.0–0.1)
Basophils Relative: 0 %
Eosinophils Absolute: 0.1 10*3/uL (ref 0.0–0.7)
Eosinophils Relative: 1 %
HCT: 41 % (ref 36.0–46.0)
Hemoglobin: 13.5 g/dL (ref 12.0–15.0)
Lymphocytes Relative: 26 %
Lymphs Abs: 2.5 10*3/uL (ref 0.7–4.0)
MCH: 31.8 pg (ref 26.0–34.0)
MCHC: 32.9 g/dL (ref 30.0–36.0)
MCV: 96.5 fL (ref 78.0–100.0)
Monocytes Absolute: 0.6 10*3/uL (ref 0.1–1.0)
Monocytes Relative: 6 %
Neutro Abs: 6.3 10*3/uL (ref 1.7–7.7)
Neutrophils Relative %: 67 %
Platelets: 266 10*3/uL (ref 150–400)
RBC: 4.25 MIL/uL (ref 3.87–5.11)
RDW: 13 % (ref 11.5–15.5)
WBC: 9.5 10*3/uL (ref 4.0–10.5)

## 2015-08-19 LAB — BASIC METABOLIC PANEL
Anion gap: 10 (ref 5–15)
BUN: 8 mg/dL (ref 6–20)
CO2: 24 mmol/L (ref 22–32)
Calcium: 9.1 mg/dL (ref 8.9–10.3)
Chloride: 105 mmol/L (ref 101–111)
Creatinine, Ser: 0.66 mg/dL (ref 0.44–1.00)
GFR calc Af Amer: >60 mL/min (ref >60)
GFR calc non Af Amer: >60 mL/min (ref >60)
Glucose, Bld: 109 mg/dL — ABNORMAL HIGH (ref 65–99)
Potassium: 4.1 mmol/L (ref 3.5–5.1)
Sodium: 139 mmol/L (ref 135–145)

## 2015-08-19 MED ORDER — DOXYCYCLINE HYCLATE 100 MG PO TABS: 100.0000 mg | ORAL_TABLET | Freq: Two times a day (BID) | ORAL | Status: DC

## 2015-08-19 MED ORDER — OXYCODONE-ACETAMINOPHEN 5-325 MG PO TABS: 1.0000 | ORAL_TABLET | Freq: Four times a day (QID) | ORAL | Status: DC | PRN

## 2015-08-19 MED ORDER — OXYCODONE-ACETAMINOPHEN 5-325 MG PO TABS
1.0000 | ORAL_TABLET | Freq: Once | ORAL | Status: AC
  Administered 2015-08-19: 1 via ORAL
  Filled 2015-08-19: qty 1

## 2015-08-19 MED ORDER — DOXYCYCLINE HYCLATE 100 MG PO TABS
100.0000 mg | ORAL_TABLET | Freq: Once | ORAL | Status: AC
  Administered 2015-08-19: 100 mg via ORAL
  Filled 2015-08-19: qty 1

## 2015-08-19 NOTE — Discharge Instructions (Signed)
You were seen today and appear to have a early infection of your right leg. Lab work is unremarkable. To be given antibiotics and pain medication. Follow-up with dermatology as already scheduled. You also need to see wound care. If you develop fevers, worsening redness of the leg, or any new or worsening symptoms she should be reevaluated immediately.  Cellulitis Cellulitis is an infection of the skin and the tissue beneath it. The infected area is usually red and tender. Cellulitis occurs most often in the arms and lower legs.  CAUSES  Cellulitis is caused by bacteria that enter the skin through cracks or cuts in the skin. The most common types of bacteria that cause cellulitis are staphylococci and streptococci. SIGNS AND SYMPTOMS   Redness and warmth.  Swelling.  Tenderness or pain.  Fever. DIAGNOSIS  Your health care provider can usually determine what is wrong based on a physical exam. Blood tests may also be done. TREATMENT  Treatment usually involves taking an antibiotic medicine. HOME CARE INSTRUCTIONS   Take your antibiotic medicine as directed by your health care provider. Finish the antibiotic even if you start to feel better.  Keep the infected arm or leg elevated to reduce swelling.  Apply a warm cloth to the affected area up to 4 times per day to relieve pain.  Take medicines only as directed by your health care provider.  Keep all follow-up visits as directed by your health care provider. SEEK MEDICAL CARE IF:   You notice red streaks coming from the infected area.  Your red area gets larger or turns dark in color.  Your bone or joint underneath the infected area becomes painful after the skin has healed.  Your infection returns in the same area or another area.  You notice a swollen bump in the infected area.  You develop new symptoms.  You have a fever. SEEK IMMEDIATE MEDICAL CARE IF:   You feel very sleepy.  You develop vomiting or diarrhea.  You  have a general ill feeling (malaise) with muscle aches and pains. MAKE SURE YOU:   Understand these instructions.  Will watch your condition.  Will get help right away if you are not doing well or get worse. Document Released: 08/16/2005 Document Revised: 03/23/2014 Document Reviewed: 01/22/2012 [facility] Patient Information 2015 [place], [place]. This information is not intended to replace advice given to you by your health care provider. Make sure you discuss any questions you have with your health care provider.

## 2015-08-19 NOTE — ED Notes (Signed)
Painful rt leg since January worse for the past few days.  Blisters all over the lower extremity from the knee down and the lesions are weeping.  lmp  now

## 2015-08-19 NOTE — ED Provider Notes (Signed)
CSN: [id number]     Arrival date & time 08/19/15  0249 History  By signing my name below, I, [name], attest that this documentation has been prepared under the direction and in the presence of Jessica Baton, MD. Electronically Signed: Doreatha [name], ED Scribe. 08/19/2015. 3:30 AM.     Chief Complaint  Patient presents with  . Leg Pain   The history is provided by the patient. No language interpreter was used.   HPI Comments: [name] is a 34 y.o. female with hx of HTN, morbid obesity, BLE lymphedema who presents to the Emergency Department complaining of multiple, recurrent sores to the posterior aspect of the right calf onset 3 days ago with associated yellow/green malodorous discharge, moderate 10/10 burning pain onset tonight, increased erythema tonight. She states that the odor and increased weeping from baseline began today. Pt notes that the blistering look is normal for her baseline. Pt notes that pain is worsened with palpation and with application of water. She notes that prior to today, she was able to wash the wounds without pain. She has taken 5 ibuprofen with little to no relief. Pt was evaluated in the ED for the initial onset of the ulcerations 8 months ago, was seen by wound care and found relief with antibiotics. Pt has an appointment with dermatology on 08/26/15 but is not currently followed by wound care. No hx of PE/DVT. Pt is not currently followed by a PCP. She denies fevers, chills, night sweats, streaking.     Past Medical History  Diagnosis Date  . Hypertension   . Morbid obesity   . Lymphedema of both lower extremities    History reviewed. No pertinent past surgical history. Family History  Problem Relation Age of Onset  . Renal Disease Mother    Social History  Substance Use Topics  . Smoking status: Never Smoker   . Smokeless tobacco: None  . Alcohol Use: No   OB History    Gravida Para Term Preterm AB TAB SAB Ectopic Multiple Living   0               Review of Systems  Constitutional: Negative for fever and chills.  Skin: Positive for color change and wound.       +green/yellow malodorous discharge      -streaking  All other systems reviewed and are negative.  Allergies  Review of patient's allergies indicates no known allergies.  Home Medications   Prior to Admission medications   Medication Sig Start Date End Date Taking? Authorizing Provider  bag balm OINT ointment Apply 1 application topically as needed for dry skin or irritation.    Historical Provider, MD  cephALEXin (KEFLEX) 500 MG capsule Take 1 capsule (500 mg total) by mouth 4 (four) times daily. 07/16/15   Eber Hong, MD  doxycycline (VIBRA-TABS) 100 MG tablet Take 1 tablet (100 mg total) by mouth 2 (two) times daily. 08/19/15   Jessica Baton, MD  hydrochlorothiazide (HYDRODIURIL) 25 MG tablet Take 1 tablet (25 mg total) by mouth daily. Patient not taking: Reported on 04/25/2015 02/28/12 02/27/13  Cherrie Distance, PA-C  ibuprofen (ADVIL,MOTRIN) 200 MG tablet Take 600 mg by mouth every 6 (six) hours as needed for moderate pain.    Historical Provider, MD  oxyCODONE-acetaminophen (PERCOCET/ROXICET) 5-325 MG tablet Take 1 tablet by mouth every 6 (six) hours as needed for severe pain. 08/19/15   Jessica Baton, MD   BP 122/69 mmHg  Pulse 74  Temp(Src)  97.6 F (36.4 C)  Resp 22  Ht  (1.88 m)  Wt 514 lb 2 oz (233.206 kg)  BMI 65.98 kg/m2  SpO2 93%  LMP 08/19/2015 Physical Exam  Constitutional: She is oriented to person, place, and time. No distress.  Morbidly obese  HENT:  Head: Normocephalic and atraumatic.  Cardiovascular: Normal rate and regular rhythm.   Pulmonary/Chest: Effort normal. No respiratory distress.  Musculoskeletal: She exhibits edema.  Extensive bilateral lower extremity edema, lymphedema  Neurological: She is alert and oriented to person, place, and time.  Skin: Skin is warm and dry.  Darkening and discoloration of the bilateral  lower extremities, venous stasis change, circular thickening of the dermis posterior right calf, mild erythema, thin/scant yellow drainage noted  Psychiatric: She has a normal mood and affect.  Nursing note and vitals reviewed.   ED Course  Procedures (including critical care time) DIAGNOSTIC STUDIES: Oxygen Saturation is 95% on RA, adequate by my interpretation.    COORDINATION OF CARE: 3:24 AM Discussed treatment plan with pt at bedside and pt agreed to plan.   Labs Review Labs Reviewed  BASIC METABOLIC PANEL - Abnormal; Notable for the following:    Glucose, Bld 109 (*)    All other components within normal limits  CBC WITH DIFFERENTIAL/PLATELET    Imaging Review No results found. I have personally reviewed and evaluated these images and lab results as part of my medical decision-making.   EKG Interpretation None      MDM   Final diagnoses:  Cellulitis of right lower extremity   Patient presents with worsening pain concerns for infection over the right lower extremity. History of lymphedema. Previous similar symptoms have responded to antibiotics. Patient is no longer seeing wound care but does have an appointment with dermatology. She has mild erythema and some drainage noted. Denies systemic symptoms. She is afebrile. Lab work is reassuring. No leukocytosis. She is previously had negative DVT studies. Will discharge home with doxycycline and follow-up with dermatology. I do feel patient would benefit from continued wound care. Should follow-up with her wound care clinic. She was given strict return precautions.  After history, exam, and medical workup I feel the patient has been appropriately medically screened and is safe for discharge home. Pertinent diagnoses were discussed with the patient. Patient was given return precautions.  I personally performed the services described in this documentation, which was scribed in my presence. The recorded information has been  reviewed and is accurate.   Jessica Baton, MD 08/19/15 [phone]

## 2015-09-01 ENCOUNTER — Encounter (HOSPITAL_COMMUNITY): Payer: Self-pay

## 2015-09-01 ENCOUNTER — Inpatient Hospital Stay (HOSPITAL_COMMUNITY)
Admission: EM | Admit: 2015-09-01 | Discharge: 2015-09-07 | DRG: 872 | Disposition: A | Discharge status: Home / Self Care | Payer: Self-pay | Attending: Internal Medicine | Admitting: Internal Medicine

## 2015-09-01 ENCOUNTER — Emergency Department (HOSPITAL_COMMUNITY): Payer: Self-pay

## 2015-09-01 ENCOUNTER — Inpatient Hospital Stay (HOSPITAL_COMMUNITY): Payer: Self-pay

## 2015-09-01 DIAGNOSIS — R000 Tachycardia, unspecified: Secondary | ICD-10-CM | POA: Diagnosis present

## 2015-09-01 DIAGNOSIS — R609 Edema, unspecified: Secondary | ICD-10-CM

## 2015-09-01 DIAGNOSIS — A419 Sepsis, unspecified organism: Principal | ICD-10-CM | POA: Diagnosis present

## 2015-09-01 DIAGNOSIS — R0789 Other chest pain: Secondary | ICD-10-CM | POA: Diagnosis present

## 2015-09-01 DIAGNOSIS — I890 Lymphedema, not elsewhere classified: Secondary | ICD-10-CM

## 2015-09-01 DIAGNOSIS — R079 Chest pain, unspecified: Secondary | ICD-10-CM

## 2015-09-01 DIAGNOSIS — L080 Pyoderma: Secondary | ICD-10-CM

## 2015-09-01 DIAGNOSIS — L03115 Cellulitis of right lower limb: Secondary | ICD-10-CM | POA: Diagnosis present

## 2015-09-01 DIAGNOSIS — Z79899 Other long term (current) drug therapy: Secondary | ICD-10-CM

## 2015-09-01 DIAGNOSIS — Z6841 Body Mass Index (BMI) 40.0 and over, adult: Secondary | ICD-10-CM

## 2015-09-01 DIAGNOSIS — I10 Essential (primary) hypertension: Secondary | ICD-10-CM | POA: Diagnosis present

## 2015-09-01 LAB — CBC WITH DIFFERENTIAL/PLATELET
Basophils Absolute: 0 10*3/uL (ref 0.0–0.1)
Basophils Relative: 0 %
Eosinophils Absolute: 0 10*3/uL (ref 0.0–0.7)
Eosinophils Relative: 0 %
HCT: 40.8 % (ref 36.0–46.0)
Hemoglobin: 13.3 g/dL (ref 12.0–15.0)
Lymphocytes Relative: 4 %
Lymphs Abs: 0.8 10*3/uL (ref 0.7–4.0)
MCH: 32 pg (ref 26.0–34.0)
MCHC: 32.6 g/dL (ref 30.0–36.0)
MCV: 98.3 fL (ref 78.0–100.0)
Monocytes Absolute: 0.8 10*3/uL (ref 0.1–1.0)
Monocytes Relative: 4 %
Neutro Abs: 20.4 10*3/uL — ABNORMAL HIGH (ref 1.7–7.7)
Neutrophils Relative %: 92 %
Platelets: 268 10*3/uL (ref 150–400)
RBC: 4.15 MIL/uL (ref 3.87–5.11)
RDW: 12.9 % (ref 11.5–15.5)
WBC: 22 10*3/uL — ABNORMAL HIGH (ref 4.0–10.5)

## 2015-09-01 LAB — COMPREHENSIVE METABOLIC PANEL
ALT: 15 U/L (ref 14–54)
AST: 21 U/L (ref 15–41)
Albumin: 4 g/dL (ref 3.5–5.0)
Alkaline Phosphatase: 59 U/L (ref 38–126)
Anion gap: 6 (ref 5–15)
BUN: 9 mg/dL (ref 6–20)
CO2: 27 mmol/L (ref 22–32)
Calcium: 9 mg/dL (ref 8.9–10.3)
Chloride: 105 mmol/L (ref 101–111)
Creatinine, Ser: 0.64 mg/dL (ref 0.44–1.00)
GFR calc Af Amer: >60 mL/min (ref >60)
GFR calc non Af Amer: >60 mL/min (ref >60)
Glucose, Bld: 108 mg/dL — ABNORMAL HIGH (ref 65–99)
Potassium: 4 mmol/L (ref 3.5–5.1)
Sodium: 138 mmol/L (ref 135–145)
Total Bilirubin: 1.1 mg/dL (ref 0.3–1.2)
Total Protein: 8.2 g/dL — ABNORMAL HIGH (ref 6.5–8.1)

## 2015-09-01 LAB — URINE MICROSCOPIC-ADD ON

## 2015-09-01 LAB — URINALYSIS, ROUTINE W REFLEX MICROSCOPIC
Bilirubin Urine: NEGATIVE
Glucose, UA: NEGATIVE mg/dL
Ketones, ur: NEGATIVE mg/dL
Leukocytes, UA: NEGATIVE
Nitrite: NEGATIVE
Protein, ur: NEGATIVE mg/dL
Specific Gravity, Urine: 1.005 (ref 1.005–1.030)
Urobilinogen, UA: 0.2 mg/dL (ref 0.0–1.0)
pH: 6 (ref 5.0–8.0)

## 2015-09-01 LAB — I-STAT CG4 LACTIC ACID, ED
Lactic Acid, Venous: 2.94 mmol/L (ref 0.5–2.0)
Lactic Acid, Venous: 2.14 mmol/L (ref 0.5–2.0)

## 2015-09-01 LAB — D-DIMER, QUANTITATIVE (NOT AT ARMC): D-Dimer, Quant: 0.49 ug/mL-FEU — ABNORMAL HIGH (ref 0.00–0.48)

## 2015-09-01 LAB — APTT: aPTT: 30 seconds (ref 24–37)

## 2015-09-01 LAB — MRSA PCR SCREENING: MRSA by PCR: NEGATIVE

## 2015-09-01 LAB — I-STAT BETA HCG BLOOD, ED (MC, WL, AP ONLY): I-stat hCG, quantitative: <5 m[IU]/mL (ref <5)

## 2015-09-01 LAB — PROCALCITONIN: Procalcitonin: <0.1 ng/mL

## 2015-09-01 LAB — PROTIME-INR
INR: 1.08 (ref 0.00–1.49)
Prothrombin Time: 14.2 seconds (ref 11.6–15.2)

## 2015-09-01 LAB — TROPONIN I: Troponin I: <0.03 ng/mL (ref <0.031)

## 2015-09-01 MED ORDER — ONDANSETRON HCL 4 MG/2ML IJ SOLN: 4.0000 mg | Freq: Four times a day (QID) | INTRAMUSCULAR | Status: DC | PRN

## 2015-09-01 MED ORDER — SODIUM CHLORIDE 0.9 % IV SOLN
1000.0000 mL | Freq: Once | INTRAVENOUS | Status: AC
  Administered 2015-09-01: 1000 mL via INTRAVENOUS

## 2015-09-01 MED ORDER — VANCOMYCIN HCL IN DEXTROSE 1-5 GM/200ML-% IV SOLN
1000.0000 mg | Freq: Once | INTRAVENOUS | Status: AC
  Administered 2015-09-01: 1000 mg via INTRAVENOUS
  Filled 2015-09-01: qty 200

## 2015-09-01 MED ORDER — ACETAMINOPHEN 325 MG PO TABS: 650.0000 mg | ORAL_TABLET | Freq: Four times a day (QID) | ORAL | Status: DC | PRN

## 2015-09-01 MED ORDER — PIPERACILLIN-TAZOBACTAM 3.375 G IVPB 30 MIN
3.3750 g | Freq: Once | INTRAVENOUS | Status: AC
  Administered 2015-09-01: 3.375 g via INTRAVENOUS
  Filled 2015-09-01: qty 50

## 2015-09-01 MED ORDER — WHITE PETROLATUM GEL
Status: DC | PRN
  Administered 2015-09-01: 0.2 via TOPICAL
  Filled 2015-09-01 (×6): qty 28.35

## 2015-09-01 MED ORDER — HYDROCODONE-ACETAMINOPHEN 5-325 MG PO TABS
1.0000 | ORAL_TABLET | ORAL | Status: DC | PRN
  Administered 2015-09-01 – 2015-09-04 (×8): 2 via ORAL
  Administered 2015-09-05 – 2015-09-06 (×5): 1 via ORAL
  Administered 2015-09-07 (×2): 2 via ORAL
  Filled 2015-09-01: qty 2
  Filled 2015-09-01 (×2): qty 1
  Filled 2015-09-01 (×3): qty 2
  Filled 2015-09-01: qty 1
  Filled 2015-09-01 (×5): qty 2
  Filled 2015-09-01: qty 1
  Filled 2015-09-01 (×2): qty 2

## 2015-09-01 MED ORDER — PIPERACILLIN-TAZOBACTAM 3.375 G IVPB 30 MIN: 3.3750 g | Freq: Once | INTRAVENOUS | Status: DC

## 2015-09-01 MED ORDER — ENOXAPARIN SODIUM 100 MG/ML ~~LOC~~ SOLN
100.0000 mg | SUBCUTANEOUS | Status: DC
  Administered 2015-09-01 – 2015-09-06 (×6): 100 mg via SUBCUTANEOUS
  Filled 2015-09-01 (×7): qty 1

## 2015-09-01 MED ORDER — ACETAMINOPHEN 650 MG RE SUPP: 650.0000 mg | Freq: Four times a day (QID) | RECTAL | Status: DC | PRN

## 2015-09-01 MED ORDER — MORPHINE SULFATE (PF) 2 MG/ML IV SOLN: 1.0000 mg | INTRAVENOUS | Status: DC | PRN

## 2015-09-01 MED ORDER — ACETAMINOPHEN 325 MG PO TABS
650.0000 mg | ORAL_TABLET | Freq: Once | ORAL | Status: AC
  Administered 2015-09-01: 650 mg via ORAL
  Filled 2015-09-01: qty 2

## 2015-09-01 MED ORDER — SODIUM CHLORIDE 0.9 % IV SOLN
1000.0000 mL | INTRAVENOUS | Status: DC
  Administered 2015-09-01 – 2015-09-02 (×3): 1000 mL via INTRAVENOUS

## 2015-09-01 MED ORDER — VANCOMYCIN HCL 10 G IV SOLR
1500.0000 mg | Freq: Three times a day (TID) | INTRAVENOUS | Status: DC
  Administered 2015-09-01 – 2015-09-04 (×9): 1500 mg via INTRAVENOUS
  Filled 2015-09-01 (×10): qty 1500

## 2015-09-01 MED ORDER — WHITE PETROLATUM GEL
1.0000 "application " | Status: DC | PRN
  Filled 2015-09-01: qty 28.35

## 2015-09-01 MED ORDER — ONDANSETRON HCL 4 MG PO TABS: 4.0000 mg | ORAL_TABLET | Freq: Four times a day (QID) | ORAL | Status: DC | PRN

## 2015-09-01 MED ORDER — PIPERACILLIN-TAZOBACTAM 3.375 G IVPB
3.3750 g | Freq: Three times a day (TID) | INTRAVENOUS | Status: DC
  Administered 2015-09-01 – 2015-09-04 (×9): 3.375 g via INTRAVENOUS
  Filled 2015-09-01 (×10): qty 50

## 2015-09-01 MED ORDER — SODIUM CHLORIDE 0.9 % IV SOLN
INTRAVENOUS | Status: AC
  Administered 2015-09-01: 14:00:00 via INTRAVENOUS

## 2015-09-01 NOTE — Progress Notes (Signed)
ANTIBIOTIC CONSULT NOTE - INITIAL  Pharmacy Consult for Vancomycin and Zosyn Indication: rule out sepsis  No Known Allergies  Patient Measurements: Weight: (!) 517 lb (234.51 kg) Adjusted Body Weight:  Vital Signs: Temp: 101.1 F (38.4 C) (10/12 1114) Temp Source: Oral (10/12 1114) BP: 129/81 mmHg (10/12 1100) Pulse Rate: 124 (10/12 1100) Intake/Output from previous day:   Intake/Output from this shift: Total I/O In: 2000 [I.V.:2000] Out: -   Labs:  Recent Labs  09/01/15 0925  WBC 22.0*  HGB 13.3  PLT 268  CREATININE 0.64   Estimated Creatinine Clearance: 215.5 mL/min (by C-G formula based on Cr of 0.64). No results for input(s): VANCOTROUGH, VANCOPEAK, VANCORANDOM, GENTTROUGH, GENTPEAK, GENTRANDOM, TOBRATROUGH, TOBRAPEAK, TOBRARND, AMIKACINPEAK, AMIKACINTROU, AMIKACIN in the last 72 hours.   Microbiology: No results found for this or any previous visit (from the past 720 hour(s)).  Medical History: Past Medical History  Diagnosis Date  . Hypertension   . Morbid obesity (HCC)   . Lymphedema of both lower extremities     Medications:  Anti-infectives    Start     Dose/Rate Route Frequency Ordered Stop   09/01/15 1130  piperacillin-tazobactam (ZOSYN) IVPB 3.375 g     3.375 g 100 mL/hr over 30 Minutes Intravenous  Once 09/01/15 1127     09/01/15 1130  vancomycin (VANCOCIN) IVPB 1000 mg/200 mL premix     1,000 mg 200 mL/hr over 60 Minutes Intravenous  Once 09/01/15 1127     09/01/15 0930  piperacillin-tazobactam (ZOSYN) IVPB 3.375 g     3.375 g 100 mL/hr over 30 Minutes Intravenous  Once 09/01/15 0924 09/01/15 1107   09/01/15 0930  vancomycin (VANCOCIN) IVPB 1000 mg/200 mL premix     1,000 mg 200 mL/hr over 60 Minutes Intravenous  Once 09/01/15 0924 09/01/15 1130     Assessment: 34yo morbidly obese female w/ increased pain to LLE draining wound, fever, and chest pain improved after ASA 324mg . Lactic acid is elevated. Pharmacy is asked to dose Vanc and  Zosyn for sepsis. SCr is wnl, CrCl >100.   Goal of Therapy:  Vancomycin trough level 15-20 mcg/ml  Appropriate antibiotic dosing for renal function; eradication of infection  Plan:  Vancomycin 2g total loading dose given in the ED then start 1.5g IV q8h. Zosyn 3.375g IV Q8H infused over 4hrs. Measure Vanc trough at steady state. Follow up renal fxn, culture results, and clinical course.  Charolotte Ekeom Lorcan Shelp, PharmD, pager [phone]. 09/01/2015,11:45 AM.

## 2015-09-01 NOTE — ED Notes (Signed)
Patient transported to X-ray 

## 2015-09-01 NOTE — ED Provider Notes (Signed)
CSN: [id number]     Arrival date & time 09/01/15  [id number] History   First MD Initiated Contact with Patient 09/01/15 [phone]     Chief Complaint  Patient presents with  . Fever  . Wound Infection     (Consider location/radiation/quality/duration/timing/severity/associated sxs/prior Treatment) HPI Patient has had chronic lower extremity lymphedema. She has had problems with chronic ulcerative wounds. The patient was seen in the emergency department September 29 and was treated with doxycycline. She had a scheduled follow-up with dermatology on October 6, at that time they continued her antibiotics with cephalexin. She reports the tested the wound to make sure that it wasn't cancer. Last night the patient developed chills and fever. She reports that she had some central chest pain this morning without associated shortness of breath. She describes it as sharp in nature without associated cough. She reports that has resolved now. She reports her with some radiation into her back. She denies she's had any abdominal pain. Past Medical History  Diagnosis Date  . Hypertension   . Morbid obesity (HCC)   . Lymphedema of both lower extremities    History reviewed. No pertinent past surgical history. Family History  Problem Relation Age of Onset  . Renal Disease Mother    Social History  Substance Use Topics  . Smoking status: Never Smoker   . Smokeless tobacco: None  . Alcohol Use: No   OB History    Gravida Para Term Preterm AB TAB SAB Ectopic Multiple Living   0              Review of Systems 10 Systems reviewed and are negative for acute change except as noted in the HPI.    Allergies  Review of patient's allergies indicates no known allergies.  Home Medications   Prior to Admission medications   Medication Sig Start Date End Date Taking? Authorizing Provider  bacitracin-polymyxin b (POLYSPORIN) ointment Apply 1 application topically 2 (two) times daily as needed (for skin rash).    Yes Historical Provider, MD  cephALEXin (KEFLEX) 500 MG capsule Take 500 mg by mouth 2 (two) times daily. 08/26/15  Yes Historical Provider, MD  ibuprofen (ADVIL,MOTRIN) 200 MG tablet Take 800 mg by mouth every 6 (six) hours as needed for fever, headache, mild pain, moderate pain or cramping.   Yes Historical Provider, MD  white petrolatum (VASELINE) GEL Apply 1 application topically as needed for dry skin.   Yes Historical Provider, MD  cephALEXin (KEFLEX) 500 MG capsule Take 1 capsule (500 mg total) by mouth 4 (four) times daily. Patient not taking: Reported on 09/01/2015 07/16/15   Eber HongBrian Miller, MD  doxycycline (VIBRA-TABS) 100 MG tablet Take 1 tablet (100 mg total) by mouth 2 (two) times daily. Patient not taking: Reported on 09/01/2015 08/19/15   Shon Batonourtney F Horton, MD  hydrochlorothiazide (HYDRODIURIL) 25 MG tablet Take 1 tablet (25 mg total) by mouth daily. Patient not taking: Reported on 04/25/2015 02/28/12 02/27/13  Cherrie DistanceFrances Sanford, PA-C  oxyCODONE-acetaminophen (PERCOCET/ROXICET) 5-325 MG tablet Take 1 tablet by mouth every 6 (six) hours as needed for severe pain. Patient not taking: Reported on 09/01/2015 08/19/15   Shon Batonourtney F Horton, MD   BP 124/65 mmHg  Pulse 122  Temp(Src) 102.6 F (39.2 C) (Rectal)  Resp 18  Wt 517 lb (234.51 kg)  SpO2 99%  LMP 08/19/2015 Physical Exam  Constitutional: She is oriented to person, place, and time.  Patient is morbidly obese. She is alert and nontoxic. She does not have respiratory  distress. Speech is clear.  HENT:  Head: Normocephalic and atraumatic.  Mouth/Throat: Oropharynx is clear and moist.  Eyes: EOM are normal. Pupils are equal, round, and reactive to light. No scleral icterus.  Neck: Neck supple.  Cardiovascular: Regular rhythm, normal heart sounds and intact distal pulses.   Tachycardia. Monitor sinus rhythm 134  Pulmonary/Chest: Effort normal and breath sounds normal. No respiratory distress. She has no wheezes. She has no rales.    Abdominal: Soft. Bowel sounds are normal. She exhibits no distension. There is no tenderness.  Abdomen is morbidly obese. Patient does not endorse tenderness to palpation. There is no rash or maceration within her skin folds.  Musculoskeletal: Normal range of motion. She exhibits edema.  Severe lymphedema and ulcerative wounds. See attached images.  Neurological: She is alert and oriented to person, place, and time. She has normal strength. No cranial nerve deficit. She exhibits normal muscle tone. Coordination normal. GCS eye subscore is 4. GCS verbal subscore is 5. GCS motor subscore is 6.  Skin: Skin is warm, dry and intact.  Psychiatric: She has a normal mood and affect.        ED Course  Procedures (including critical care time) Labs Review Labs Reviewed  COMPREHENSIVE METABOLIC PANEL - Abnormal; Notable for the following:    Glucose, Bld 108 (*)    Total Protein 8.2 (*)    All other components within normal limits  CBC WITH DIFFERENTIAL/PLATELET - Abnormal; Notable for the following:    WBC 22.0 (*)    Neutro Abs 20.4 (*)    All other components within normal limits  URINALYSIS, ROUTINE W REFLEX MICROSCOPIC (NOT AT [facility]) - Abnormal; Notable for the following:    Hgb urine dipstick SMALL (*)    All other components within normal limits  I-STAT CG4 LACTIC ACID, ED - Abnormal; Notable for the following:    Lactic Acid, Venous 2.94 (*)    All other components within normal limits  CULTURE, BLOOD (ROUTINE X 2)  CULTURE, BLOOD (ROUTINE X 2)  URINE CULTURE  URINE MICROSCOPIC-ADD ON  I-STAT BETA HCG BLOOD, ED ([facility], [facility], [facility] ONLY)    Imaging Review No results found. I have personally reviewed and evaluated these images and lab results as part of my medical decision-making.   EKG Interpretation   Date/Time:  Wednesday September 01 2015 09:08:31 EDT Ventricular Rate:  134 PR Interval:  124 QRS Duration: 83 QT Interval:  294 QTC Calculation: 439 R Axis:   -16 Text  Interpretation:  Sinus tachycardia Borderline left axis deviation Low  voltage, precordial leads Abnormal R-wave progression, early transition  Abnormal T, consider ischemia, lateral leads agree. rate related changes.  no sig change from old Confirmed by Donnald Garre, MD, Lebron Conners [phone]) on  09/01/2015 9:12:16 AM     CRITICAL CARE Performed by: Arby Barrette   Total critical care time: 45  Critical care time was exclusive of separately billable procedures and treating other patients.  Critical care was necessary to treat or prevent imminent or life-threatening deterioration.  Critical care was time spent personally by me on the following activities: development of treatment plan with patient and/or surrogate as well as nursing, discussions with consultants, evaluation of patient's response to treatment, examination of patient, obtaining history from patient or surrogate, ordering and performing treatments and interventions, ordering and review of laboratory studies, ordering and review of radiographic studies, pulse oximetry and re-evaluation of patient's condition. MDM   Final diagnoses:  Cellulitis of right lower extremity  Sepsis,  due to unspecified organism (HCC)  Ecthyma  Lymphedema   Patient has chronic lymphedema with ulcerations. At this point there appears to be acute on chronic infection. There is erythema surrounding the areas of ulceration. She has had fever as well as leukocytosis. Patient's mental status is clear and respiratory status is stable. Sepsis protocol was initiated for cellulitis\skin origin. She has been treated as an outpatient both with doxycycline and Keflex with failure of outpatient antibiotics.    Arby Barrette, MD 09/01/15 1027

## 2015-09-01 NOTE — ED Notes (Signed)
Bed: WA17 Expected date:  Expected time:  Means of arrival:  Comments: EMS- 34yo F, chest pain/Tachy

## 2015-09-01 NOTE — ED Notes (Signed)
Per [facility]- Pt seen at wound center for draining wound to LLE since January. Currently taking antibiotics. C/o of increased pain tjo LLE starting last evening. Chills and fever 0500. Chest pain without shortness of breath this AM however EMS arrival decreased CP with 324mg  PO of ASA. EKG ST HR 133. Per [facility]- LLE with foul odor, redness, warm to touch. Pt is ambulatory.

## 2015-09-01 NOTE — Progress Notes (Signed)
*  PRELIMINARY RESULTS* Vascular Ultrasound Lower extremity venous duplexd has been completed.  Preliminary findings: Technically limited study due to patient body habitus. No obvious DVT noted in visualized veins.   Farrel DemarkJill Eunice, RDMS, RVT  09/01/2015, 3:20 PM

## 2015-09-01 NOTE — ED Notes (Signed)
MD at bedside. ADMITTING MD PRESENT 

## 2015-09-01 NOTE — H&P (Addendum)
[facility] History and Physical  [name] WUJ:[id number] DOB: 28-May-1981 DOA: 09/01/2015  Referring physician: ED PCP: No primary care provider on file.   Chief Complaint: worsening LE   HPI: [name] is a 34 y.o. female with PMH significant for chronic lymphedema, chronic wound, The patient was seen in the emergency department September 29 and was treated with doxycycline.  She had a scheduled follow-up with dermatology on October 6, at that time they continued her antibiotics with cephalexin. She presents today with chills, and fevers. She report central chest pain today with associated cough, which has resolved.    Review of Systems:  Negative, except as per HPI  Past Medical History  Diagnosis Date  . Hypertension   . Morbid obesity (HCC)   . Lymphedema of both lower extremities    History reviewed. No pertinent past surgical history. Social History:  reports that she has never smoked. She does not have any smokeless tobacco history on file. She reports that she does not drink alcohol or use illicit drugs.  No Known Allergies  Family History  Problem Relation Age of Onset  . Renal Disease Mother     Prior to Admission medications   Medication Sig Start Date End Date Taking? Authorizing Provider  bacitracin-polymyxin b (POLYSPORIN) ointment Apply 1 application topically 2 (two) times daily as needed (for skin rash).   Yes Historical Provider, MD  cephALEXin (KEFLEX) 500 MG capsule Take 500 mg by mouth 2 (two) times daily. 08/26/15  Yes Historical Provider, MD  ibuprofen (ADVIL,MOTRIN) 200 MG tablet Take 800 mg by mouth every 6 (six) hours as needed for fever, headache, mild pain, moderate pain or cramping.   Yes Historical Provider, MD  white petrolatum (VASELINE) GEL Apply 1 application topically as needed for dry skin.   Yes Historical Provider, MD  cephALEXin (KEFLEX) 500 MG capsule Take 1 capsule (500 mg total) by mouth 4 (four) times daily. Patient not  taking: Reported on 09/01/2015 07/16/15   Eber Hong, MD  doxycycline (VIBRA-TABS) 100 MG tablet Take 1 tablet (100 mg total) by mouth 2 (two) times daily. Patient not taking: Reported on 09/01/2015 08/19/15   Shon Baton, MD  hydrochlorothiazide (HYDRODIURIL) 25 MG tablet Take 1 tablet (25 mg total) by mouth daily. Patient not taking: Reported on 04/25/2015 02/28/12 02/27/13  Cherrie Distance, PA-C  oxyCODONE-acetaminophen (PERCOCET/ROXICET) 5-325 MG tablet Take 1 tablet by mouth every 6 (six) hours as needed for severe pain. Patient not taking: Reported on 09/01/2015 08/19/15   Shon Baton, MD   Physical Exam: Filed Vitals:   09/01/15 1500 09/01/15 1600 09/01/15 1700 09/01/15 1800  BP: 118/69 112/54 136/51 106/48  Pulse: 124 123 123 123  Temp:  101 F (38.3 C)    TempSrc:  Oral    Resp: Height:      Weight:      SpO2: 95% 96% 99% 98%    Wt Readings from Last 3 Encounters:  09/01/15 234.51 kg (517 lb)  08/19/15 233.206 kg (514 lb 2 oz)  07/16/15 239.046 kg (527 lb)    General:  Appears calm and comfortable, morbid obese Eyes: PERRL, normal lids, irises & conjunctiva ENT: grossly normal hearing, lips & tongue Neck: no LAD, masses or thyromegaly Cardiovascular: RRR, no m/r/g. No LE edema. Telemetry: SR, no arrhythmias  Respiratory: CTA bilaterally, no w/r/r. Normal respiratory effort. Abdomen: soft, ntnd Skin: bilateral LE lymphedema with edema, redness more pronounced on right LE, ulcerations.  Musculoskeletal: grossly normal tone BUE/BLE Psychiatric: grossly normal mood and affect, speech fluent and appropriate Neurologic: grossly non-focal.          Labs on Admission:  Basic Metabolic Panel:  Recent Labs Lab 09/01/15 0925  NA 138  K 4.0  CL 105  CO2 27  GLUCOSE 108*  BUN 9  CREATININE 0.64  CALCIUM 9.0   Liver Function Tests:  Recent Labs Lab 09/01/15 0925  AST 21  ALT 15  ALKPHOS 59  BILITOT 1.1  PROT 8.2*  ALBUMIN 4.0   No  results for input(s): LIPASE, AMYLASE in the last 168 hours. No results for input(s): AMMONIA in the last 168 hours. CBC:  Recent Labs Lab 09/01/15 0925  WBC 22.0*  NEUTROABS 20.4*  HGB 13.3  HCT 40.8  MCV 98.3  PLT 268   Cardiac Enzymes: No results for input(s): CKTOTAL, CKMB, CKMBINDEX, TROPONINI in the last 168 hours.  BNP (last 3 results) No results for input(s): BNP in the last 8760 hours.  ProBNP (last 3 results) No results for input(s): PROBNP in the last 8760 hours.  CBG: No results for input(s): GLUCAP in the last 168 hours.  Radiological Exams on Admission: Dg Chest 2 View  09/01/2015  CLINICAL DATA:  Chills and fever.  Chest pain.  Shortness of breath. EXAM: CHEST  2 VIEW COMPARISON:  02/03/2009 FINDINGS: Body habitus reduces diagnostic sensitivity and specificity. Upper normal heart size for technique. The patient is rotated to the left on today's radiograph, reducing diagnostic sensitivity and specificity. The lungs appear clear.  No pleural effusion. IMPRESSION: 1.  No significant abnormality identified. Electronically Signed   By: Gaylyn RongWalter  Liebkemann M.D.   On: 09/01/2015 10:25    EKG: Independently reviewed. Sinus tachycardia  Assessment/Plan Active Problems:   Sepsis (HCC)   Chest pain   Lymphedema   Cellulitis of right lower extremity   1-Sepsis;  Presents with fever, leukocytosis, source LE cellulitis.  IV fluids, IV antibiotics.  Trend lactic acid.   2-Chest pain:  This has resolved. D dimer only mildly elevated at 0.49.  Will check ECHO and cycle cardiac enzymes.  Unable to do CT chest here do to weight.  Doppler LE ordered.   3-Cellulitis of LE on super impose lymphedema:  IV antibiotics. Wound care consulted.   Code Status: presume full code.  DVT Prophylaxis:lovenox Family Communication:care discussed with patient.  Disposition Plan:expect 3 to 4 days inpatient.   Time spent: 75 minutes.   Hartley Barefootegalado, Elwyn Klosinski A [facility] Pager [phone]

## 2015-09-01 NOTE — ED Notes (Signed)
I gave I stat CG4  Critical result to MD Phieffer

## 2015-09-01 NOTE — ED Notes (Signed)
Family at bedside. 

## 2015-09-02 ENCOUNTER — Inpatient Hospital Stay (HOSPITAL_COMMUNITY): Payer: Self-pay

## 2015-09-02 DIAGNOSIS — R079 Chest pain, unspecified: Secondary | ICD-10-CM

## 2015-09-02 DIAGNOSIS — R072 Precordial pain: Secondary | ICD-10-CM

## 2015-09-02 DIAGNOSIS — A419 Sepsis, unspecified organism: Principal | ICD-10-CM

## 2015-09-02 LAB — BASIC METABOLIC PANEL
Anion gap: 5 (ref 5–15)
BUN: 10 mg/dL (ref 6–20)
CO2: 27 mmol/L (ref 22–32)
Calcium: 8 mg/dL — ABNORMAL LOW (ref 8.9–10.3)
Chloride: 102 mmol/L (ref 101–111)
Creatinine, Ser: 0.64 mg/dL (ref 0.44–1.00)
GFR calc Af Amer: >60 mL/min (ref >60)
GFR calc non Af Amer: >60 mL/min (ref >60)
Glucose, Bld: 116 mg/dL — ABNORMAL HIGH (ref 65–99)
Potassium: 3.7 mmol/L (ref 3.5–5.1)
Sodium: 134 mmol/L — ABNORMAL LOW (ref 135–145)

## 2015-09-02 LAB — CBC
HCT: 34.9 % — ABNORMAL LOW (ref 36.0–46.0)
Hemoglobin: 11.1 g/dL — ABNORMAL LOW (ref 12.0–15.0)
MCH: 31.5 pg (ref 26.0–34.0)
MCHC: 31.8 g/dL (ref 30.0–36.0)
MCV: 99.1 fL (ref 78.0–100.0)
Platelets: 211 10*3/uL (ref 150–400)
RBC: 3.52 MIL/uL — ABNORMAL LOW (ref 3.87–5.11)
RDW: 13.4 % (ref 11.5–15.5)
WBC: 13 10*3/uL — ABNORMAL HIGH (ref 4.0–10.5)

## 2015-09-02 LAB — URINE CULTURE: Culture: 3000

## 2015-09-02 LAB — TROPONIN I
Troponin I: <0.03 ng/mL (ref <0.031)
Troponin I: <0.03 ng/mL (ref <0.031)

## 2015-09-02 MED ORDER — PERFLUTREN LIPID MICROSPHERE
1.0000 mL | INTRAVENOUS | Status: AC | PRN
  Administered 2015-09-02: 3 mL via INTRAVENOUS
  Filled 2015-09-02: qty 10

## 2015-09-02 MED ORDER — PERFLUTREN LIPID MICROSPHERE
INTRAVENOUS | Status: AC
  Filled 2015-09-02: qty 10

## 2015-09-02 MED ORDER — SODIUM CHLORIDE 0.9 % IV SOLN: INTRAVENOUS | Status: DC

## 2015-09-02 NOTE — Consult Note (Signed)
WOC wound consult note Reason for Consult: Bilateral lymphedema with ulcerations on the right lateral LE.  Patient [facility] and has an appointment to return there for consultation on the wounds on the RLE on October 19th. Wound type: Lumphedema with full thickness tissue loss on the right lateral LE Pressure Ulcer POA: No Measurement: 13cm x 16cm area with 14 lesions (full thickness) embedded within. Intact skin bridges between lesions. Wound bed: Red, moist. No necrotic tissue Drainage (amount, consistency, odor) large amounts of light yellow exudate, no odor Periwound:macerated, edematous.  No warmth or induration. Dressing procedure/placement/frequency: I will employ conservative therapy in light of the fact that patient is to see outpatient wound care Md who has followed her in the past on 10/19. We will manage the exudate with xeroform both for the antimicrobial properties and for the astringent properties and top with ABDs for absorbency. We will ask Nursing to perform twice daily to prevent maceration to the surrounding skin. Pressure redistribution boots are provided bilaterally for pressure ulcer prevention while patient is in bed. WOC nursing team will not follow, but will remain available to this patient, the nursing and medical team.  Please re-consult if needed. Thanks, Ladona MowLaurie Dorothye Berni, MSN, RN, GNP, [place]OCN, CWON-AP [phone])

## 2015-09-02 NOTE — Progress Notes (Signed)
[facility] PROGRESS NOTE  Marlaine Hindrica R Neels UYQ:[id number] DOB: 1981/04/20 DOA: 09/01/2015 PCP: No primary care provider on file.  Assessment/Plan:  1-Lower extremity cellulitis.  Sepsis; Bacteremia  Presents with fever, leukocytosis, Tachycardia,source LE cellulitis.  IV fluids, IV antibiotics.  lactic acid trending down 2.1  1 blood culture gram positive cocci in cluster.   2-Chest pain: Resolved, suspect GI vs MSK  This has resolved. D dimer only mildly elevated at 0.49.  ECHO pending  and cycle cardiac enzymes negative.  Doppler LE limited, not LE DVT  3-Cellulitis of LE on super impose lymphedema:  IV antibiotics. Wound care consulted  Code Status: Full Code.  Family Communication: Care discussed with patient  Disposition Plan: Transfer to telemetry    Consultants:  none  Procedures: Doppler; Technically difficult and limited study. - No evidence of deep vein thrombosis involving the visualized   veins of the right lower extremity and left lower extremity.  Antibiotics:  Vancomycin 10-12  Zosyn 10-12  HPI/Subjective: Feeling better, denies chest pain, no dyspnea.   Objective: Filed Vitals:   09/02/15 0800  BP: 106/65  Pulse: 82  Temp: 97.7 F (36.5 C)  Resp: 17    Intake/Output Summary (Last 24 hours) at 09/02/15 0842 Last data filed at 09/02/15 0600  Gross per 24 hour  Intake 7155.83 ml  Output   1350 ml  Net 5805.83 ml   Filed Weights   09/01/15 0909 09/01/15 1300  Weight: 234.51 kg (517 lb) 234.51 kg (517 lb)    Exam:   General:  NAD  Cardiovascular: S 1, S 2 RRR  Respiratory: CTA  Abdomen: BS present, soft,  Musculoskeletal: Bilateral LE with lymphedema, edema redness, and ulceration  Data Reviewed: Basic Metabolic Panel:  Recent Labs Lab 09/01/15 0925 09/02/15 0557  NA 138 134*  K 4.0 3.7  CL 105 102  CO2 27 27  GLUCOSE 108* 116*  BUN 9 10  CREATININE 0.64 0.64  CALCIUM 9.0 8.0*   Liver Function  Tests:  Recent Labs Lab 09/01/15 0925  AST 21  ALT 15  ALKPHOS 59  BILITOT 1.1  PROT 8.2*  ALBUMIN 4.0   No results for input(s): LIPASE, AMYLASE in the last 168 hours. No results for input(s): AMMONIA in the last 168 hours. CBC:  Recent Labs Lab 09/01/15 0925 09/02/15 0557  WBC 22.0* 13.0*  NEUTROABS 20.4*  --   HGB 13.3 11.1*  HCT 40.8 34.9*  MCV 98.3 99.1  PLT 268 211   Cardiac Enzymes:  Recent Labs Lab 09/01/15 1810 09/01/15 2345 09/02/15 0557  TROPONINI <0.03 <0.03 <0.03   BNP (last 3 results) No results for input(s): BNP in the last 8760 hours.  ProBNP (last 3 results) No results for input(s): PROBNP in the last 8760 hours.  CBG: No results for input(s): GLUCAP in the last 168 hours.  Recent Results (from the past 240 hour(s))  Culture, blood (routine x 2)     Status: None (Preliminary result)   Collection Time: 09/01/15  9:25 AM  Result Value Ref Range Status   Specimen Description BLOOD LEFT ANTECUBITAL  Final   Special Requests BOTTLES DRAWN AEROBIC AND ANAEROBIC 5ML  Final   Culture  Setup Time   Final    GRAM POSITIVE COCCI IN CLUSTERS IN BOTH AEROBIC AND ANAEROBIC BOTTLES CRITICAL RESULT CALLED TO, READ BACK BY AND VERIFIED WITH: C. CAGLE,RN AT [id number] ON [id number] BY Lucienne CapersS. YARBROUGH Performed at [facility]    Culture PENDING  Incomplete  Report Status PENDING  Incomplete  MRSA PCR Screening     Status: None   Collection Time: 09/01/15  1:03 PM  Result Value Ref Range Status   MRSA by PCR NEGATIVE NEGATIVE Final    Comment:        The GeneXpert MRSA Assay (FDA approved for NASAL specimens only), is one component of a comprehensive MRSA colonization surveillance program. It is not intended to diagnose MRSA infection nor to guide or monitor treatment for MRSA infections.      Studies: Dg Chest 2 View  09/01/2015  CLINICAL DATA:  Chills and fever.  Chest pain.  Shortness of breath. EXAM: CHEST  2 VIEW COMPARISON:  02/03/2009  FINDINGS: Body habitus reduces diagnostic sensitivity and specificity. Upper normal heart size for technique. The patient is rotated to the left on today's radiograph, reducing diagnostic sensitivity and specificity. The lungs appear clear.  No pleural effusion. IMPRESSION: 1.  No significant abnormality identified. Electronically Signed   By: Gaylyn Rong M.D.   On: 09/01/2015 10:25    Scheduled Meds: . enoxaparin (LOVENOX) injection  100 mg Subcutaneous Q24H  . piperacillin-tazobactam (ZOSYN)  IV  3.375 g Intravenous 3 times per day  . vancomycin  1,500 mg Intravenous Q8H   Continuous Infusions: . sodium chloride 1,000 mL (09/02/15 0502)    Active Problems:   Sepsis (HCC)   Chest pain   Lymphedema   Cellulitis of right lower extremity    Time spent: 35 minutes.     Hartley Barefoot A  [facility] Pager [phone]. If 7PM-7AM, please contact night-coverage at [email or web address], password [facility] 09/02/2015, 8:42 AM  LOS: 1 day

## 2015-09-02 NOTE — Progress Notes (Signed)
Delay in transfer due to 2 D echo being performed, WOC RN consulting, and IV site infiltration.

## 2015-09-02 NOTE — Progress Notes (Signed)
Echocardiogram 2D Echocardiogram has been performed.  Dorothey BasemanReel, Marlin Jarrard M 09/02/2015, 3:42 PM

## 2015-09-02 NOTE — Care Management Note (Signed)
Case Management Note  Patient Details  Name: Marlaine Hindrica R Baquera MRN: [id number] Date of Birth: 10/15/1981  Subjective/Objective:          Cellulitis with sepsis failed oupt treatment           Action/Plan:Date:  Oct. 13, 2016 U.R. performed for needs and level of care. Will continue to follow for Case Management needs.  Marcelle Smilinghonda Davis, RN, BSN, [place]   [phone]   Expected Discharge Date:   (unknown)               Expected Discharge Plan:  Home/Self Care  In-House Referral:  NA  Discharge planning Services  CM Consult  Post Acute Care Choice:  NA Choice offered to:  NA  DME Arranged:  N/A DME Agency:  NA  HH Arranged:  NA HH Agency:  NA  Status of Service:  In process, will continue to follow  Medicare Important Message Given:    Date Medicare IM Given:    Medicare IM give by:    Date Additional Medicare IM Given:    Additional Medicare Important Message give by:     If discussed at Long Length of Stay Meetings, dates discussed:    Additional Comments:  Golda AcreDavis, Rhonda Lynn, RN 09/02/2015, 10:21 AM

## 2015-09-02 NOTE — Progress Notes (Signed)
Patient transferred to 5 East room 5E16.  Patient A&Ox 4 .  No pain or concerns at this time.  Family at bedside.  Oriented patient and family to unit and staff.  IV team consulted for loss of IV access.  Patient assessed and documented in Flowsheets.

## 2015-09-03 LAB — BASIC METABOLIC PANEL
Anion gap: 5 (ref 5–15)
BUN: 8 mg/dL (ref 6–20)
CO2: 28 mmol/L (ref 22–32)
Calcium: 8.7 mg/dL — ABNORMAL LOW (ref 8.9–10.3)
Chloride: 105 mmol/L (ref 101–111)
Creatinine, Ser: 0.52 mg/dL (ref 0.44–1.00)
GFR calc Af Amer: >60 mL/min (ref >60)
GFR calc non Af Amer: >60 mL/min (ref >60)
Glucose, Bld: 92 mg/dL (ref 65–99)
Potassium: 4 mmol/L (ref 3.5–5.1)
Sodium: 138 mmol/L (ref 135–145)

## 2015-09-03 LAB — CBC
HCT: 36.7 % (ref 36.0–46.0)
Hemoglobin: 12 g/dL (ref 12.0–15.0)
MCH: 32.1 pg (ref 26.0–34.0)
MCHC: 32.7 g/dL (ref 30.0–36.0)
MCV: 98.1 fL (ref 78.0–100.0)
Platelets: 223 10*3/uL (ref 150–400)
RBC: 3.74 MIL/uL — ABNORMAL LOW (ref 3.87–5.11)
RDW: 13 % (ref 11.5–15.5)
WBC: 8.2 10*3/uL (ref 4.0–10.5)

## 2015-09-03 LAB — VANCOMYCIN, TROUGH: Vancomycin Tr: 15 ug/mL (ref 10.0–20.0)

## 2015-09-03 NOTE — Progress Notes (Signed)
Date: September 03, 2015 No discharge needs present at time of release.  Rhonda Davis, RN, BSN, CCM   [phone] 

## 2015-09-03 NOTE — Progress Notes (Signed)
[facility] PROGRESS NOTE  [name]:[id number] DOB: 1981/01/30 DOA: 09/01/2015 PCP: No primary care provider on file.  Assessment/Plan:  1-Lower extremity cellulitis.  Sepsis; Bacteremia  Presents with fever, leukocytosis, Tachycardia,source LE cellulitis.  IV fluids, IV antibiotics. IV vancomycin and Zosyn day 2. lactic acid trending down 2.1  1 blood culture gram positive cocci in cluster.  Repeat Blood cultures.   2-Chest pain: Resolved, suspect GI vs MSK  This has resolved. D dimer only mildly elevated at 0.49.  ECHO normal Ef. and cycle cardiac enzymes negative.  Doppler LE limited, not LE DVT  3-Cellulitis of LE on super impose lymphedema:  IV antibiotics. Wound care consulted, local care.   Code Status: Full Code.  Family Communication: Care discussed with patient  Disposition Plan: Awaiting blood culture results.    Consultants:  none  Procedures: Doppler; Technically difficult and limited study. - No evidence of deep vein thrombosis involving the visualized   veins of the right lower extremity and left lower extremity.                                          ECHO; normal Ef. Diastolic dysfunction.  Antibiotics:  Vancomycin 10-12  Zosyn 10-12  HPI/Subjective: Feeling better, denies chest pain, no dyspnea.  edness of right lower extremity has decreased.   Objective: Filed Vitals:   09/03/15 0542  BP: 132/98  Pulse: 94  Temp: 97.6 F (36.4 C)  Resp: 20    Intake/Output Summary (Last 24 hours) at 09/03/15 1011 Last data filed at 09/02/15 1400  Gross per 24 hour  Intake    625 ml  Output    400 ml  Net    225 ml   Filed Weights   09/01/15 0909 09/01/15 1300  Weight: 234.51 kg (517 lb) 234.51 kg (517 lb)    Exam:   General:  NAD  Cardiovascular: S 1, S 2 RRR  Respiratory: CTA  Abdomen: BS present, soft,  Musculoskeletal: Bilateral LE with lymphedema, edema redness, and ulceration more pronounced right  Data  Reviewed: Basic Metabolic Panel:  Recent Labs Lab 09/01/15 0925 09/02/15 0557 09/03/15 0553  NA 138 134* 138  K 4.0 3.7 4.0  CL 105 102 105  CO2 GLUCOSE 108* 116* 92  BUN CREATININE 0.64 0.64 0.52  CALCIUM 9.0 8.0* 8.7*   Liver Function Tests:  Recent Labs Lab 09/01/15 0925  AST 21  ALT 15  ALKPHOS 59  BILITOT 1.1  PROT 8.2*  ALBUMIN 4.0   No results for input(s): LIPASE, AMYLASE in the last 168 hours. No results for input(s): AMMONIA in the last 168 hours. CBC:  Recent Labs Lab 09/01/15 0925 09/02/15 0557 09/03/15 0553  WBC 22.0* 13.0* 8.2  NEUTROABS 20.4*  --   --   HGB 13.3 11.1* 12.0  HCT 40.8 34.9* 36.7  MCV 98.3 99.1 98.1  PLT 268 211 223   Cardiac Enzymes:  Recent Labs Lab 09/01/15 1810 09/01/15 2345 09/02/15 0557  TROPONINI <0.03 <0.03 <0.03   BNP (last 3 results) No results for input(s): BNP in the last 8760 hours.  ProBNP (last 3 results) No results for input(s): PROBNP in the last 8760 hours.  CBG: No results for input(s): GLUCAP in the last 168 hours.  Recent Results (from the past 240 hour(s))  Urine culture  Status: None   Collection Time: 09/01/15  9:13 AM  Result Value Ref Range Status   Specimen Description URINE, CLEAN CATCH  Final   Special Requests NONE  Final   Culture   Final    3,000 COLONIES/mL INSIGNIFICANT GROWTH Performed at [facility]    Report Status 09/02/2015 FINAL  Final  Culture, blood (routine x 2)     Status: None (Preliminary result)   Collection Time: 09/01/15  9:15 AM  Result Value Ref Range Status   Specimen Description BLOOD LEFT FOREARM  Final   Special Requests IN PEDIATRIC BOTTLE 2ML  Final   Culture   Final    NO GROWTH 1 DAY Performed at [facility]    Report Status PENDING  Incomplete  Culture, blood (routine x 2)     Status: None (Preliminary result)   Collection Time: 09/01/15  9:25 AM  Result Value Ref Range Status   Specimen Description BLOOD  LEFT ANTECUBITAL  Final   Special Requests BOTTLES DRAWN AEROBIC AND ANAEROBIC 5ML  Final   Culture  Setup Time   Final    GRAM POSITIVE COCCI IN CLUSTERS IN BOTH AEROBIC AND ANAEROBIC BOTTLES CRITICAL RESULT CALLED TO, READ BACK BY AND VERIFIED WITH: C. CAGLE,RN AT [id number] ON [id number] BY Lucienne CapersS. YARBROUGH    Culture   Final    NO GROWTH 1 DAY Performed at [facility]     Report Status PENDING  Incomplete  MRSA PCR Screening     Status: None   Collection Time: 09/01/15  1:03 PM  Result Value Ref Range Status   MRSA by PCR NEGATIVE NEGATIVE Final    Comment:        The GeneXpert MRSA Assay (FDA approved for NASAL specimens only), is one component of a comprehensive MRSA colonization surveillance program. It is not intended to diagnose MRSA infection nor to guide or monitor treatment for MRSA infections.      Studies: No results found.  Scheduled Meds: . enoxaparin (LOVENOX) injection  100 mg Subcutaneous Q24H  . piperacillin-tazobactam (ZOSYN)  IV  3.375 g Intravenous 3 times per day  . vancomycin  1,500 mg Intravenous Q8H   Continuous Infusions: . sodium chloride 75 mL/hr at 09/02/15 1400    Active Problems:   Sepsis (HCC)   Chest pain   Lymphedema   Cellulitis of right lower extremity    Time spent: 35 minutes.     Hartley Barefootegalado, Belkys A  [facility] Pager [phone]. If 7PM-7AM, please contact night-coverage at [email or web address], password [email or web address] 09/03/2015, 10:11 AM  LOS: 2 days

## 2015-09-03 NOTE — Progress Notes (Addendum)
ANTIBIOTIC CONSULT NOTE - Follow-Up  Pharmacy Consult for Vancomycin and Zosyn Indication: Sepsis 2/2 LE cellulitis  No Known Allergies  Patient Measurements: Height:  (188 cm) Weight: (!) 517 lb (234.51 kg) IBW/kg (Calculated) : 77.7  Vital Signs: Temp: 97.6 F (36.4 C) (10/14 0542) Temp Source: Oral (10/14 0542) BP: 132/98 mmHg (10/14 0542) Pulse Rate: 94 (10/14 0542) Intake/Output from previous day: 10/13 0701 - 10/14 0700 In: 875 [I.V.:325; IV Piggyback:550] Out: 900 [Urine:900] Intake/Output from this shift:    Labs:  Recent Labs  09/01/15 0925 09/02/15 0557 09/03/15 0553  WBC 22.0* 13.0* 8.2  HGB 13.3 11.1* 12.0  PLT 268 211 223  CREATININE 0.64 0.64 0.52   Estimated Creatinine Clearance: 215.5 mL/min (by C-G formula based on Cr of 0.52). No results for input(s): VANCOTROUGH, VANCOPEAK, VANCORANDOM, GENTTROUGH, GENTPEAK, GENTRANDOM, TOBRATROUGH, TOBRAPEAK, TOBRARND, AMIKACINPEAK, AMIKACINTROU, AMIKACIN in the last 72 hours.   Microbiology: Recent Results (from the past 720 hour(s))  Urine culture     Status: None   Collection Time: 09/01/15  9:13 AM  Result Value Ref Range Status   Specimen Description URINE, CLEAN CATCH  Final   Special Requests NONE  Final   Culture   Final    3,000 COLONIES/mL INSIGNIFICANT GROWTH Performed at [facility]    Report Status 09/02/2015 FINAL  Final  Culture, blood (routine x 2)     Status: None (Preliminary result)   Collection Time: 09/01/15  9:15 AM  Result Value Ref Range Status   Specimen Description BLOOD LEFT FOREARM  Final   Special Requests IN PEDIATRIC BOTTLE  Final   Culture   Final    NO GROWTH 1 DAY Performed at [facility]    Report Status PENDING  Incomplete  Culture, blood (routine x 2)     Status: None (Preliminary result)   Collection Time: 09/01/15  9:25 AM  Result Value Ref Range Status   Specimen Description BLOOD LEFT ANTECUBITAL  Final   Special Requests BOTTLES  DRAWN AEROBIC AND ANAEROBIC  Final   Culture  Setup Time   Final    GRAM POSITIVE COCCI IN CLUSTERS IN BOTH AEROBIC AND ANAEROBIC BOTTLES CRITICAL RESULT CALLED TO, READ BACK BY AND VERIFIED WITH: C. CAGLE,RN AT 1610 ON [id number] BY Lucienne Capers    Culture   Final    NO GROWTH 1 DAY Performed at [facility]    Report Status PENDING  Incomplete  MRSA PCR Screening     Status: None   Collection Time: 09/01/15  1:03 PM  Result Value Ref Range Status   MRSA by PCR NEGATIVE NEGATIVE Final    Comment:        The GeneXpert MRSA Assay (FDA approved for NASAL specimens only), is one component of a comprehensive MRSA colonization surveillance program. It is not intended to diagnose MRSA infection nor to guide or monitor treatment for MRSA infections.     Medical History: Past Medical History  Diagnosis Date  . Hypertension   . Morbid obesity (HCC)   . Lymphedema of both lower extremities     Assessment: 34 y/o morbidly obese female with chronic lymphedema who presented to [facility] ED on 10/12 with increased pain to LLE draining wound, fever, and chest pain. Pharmacy consulted to dose Vancomycin and Zosyn for sepsis 2/2 lower extremity cellulitis.   PTA Keflex >> 10/11 10/12 >> Vanc >> 10/12 >> Zosyn >>   10/12 blood (L antecubital): CoNS (in both aerobic  and anaerobic bottles) 10/12 blood (L forearm): NGTD 10/12 urine: 3k colonies, insig growth 10/14 blood x 2: sent  Goal of Therapy:  Vancomycin trough level 15-20 mcg/ml  Appropriate antibiotic dosing for renal function; eradication of infection  Plan:   Continue Vancomycin 1.5g IV q8h.  Check Vancomycin trough level prior to next dose.  Continue Zosyn 3.375g IV q8h infused over 4hrs.  Follow up renal fxn, culture results, de-escalation of therapy, clinical course.   Greer PickerelJigna Ahmari Garton, PharmD, BCPS Pager: [phone] 09/03/2015 11:56 AM   Addendum:  Assessment:  Vancomycin trough level returned at 15 mcg/mL-at  goal.  Plan: Continue present dosing of Vancomycin.   Greer PickerelJigna Mahamadou Weltz, PharmD, BCPS Pager: [phone] 09/03/2015 2:50 PM

## 2015-09-04 DIAGNOSIS — E669 Obesity, unspecified: Secondary | ICD-10-CM

## 2015-09-04 DIAGNOSIS — B9689 Other specified bacterial agents as the cause of diseases classified elsewhere: Secondary | ICD-10-CM

## 2015-09-04 DIAGNOSIS — I890 Lymphedema, not elsewhere classified: Secondary | ICD-10-CM

## 2015-09-04 DIAGNOSIS — L03115 Cellulitis of right lower limb: Secondary | ICD-10-CM

## 2015-09-04 LAB — BASIC METABOLIC PANEL
Anion gap: 7 (ref 5–15)
BUN: 10 mg/dL (ref 6–20)
CO2: 30 mmol/L (ref 22–32)
Calcium: 8.8 mg/dL — ABNORMAL LOW (ref 8.9–10.3)
Chloride: 104 mmol/L (ref 101–111)
Creatinine, Ser: 0.78 mg/dL (ref 0.44–1.00)
GFR calc Af Amer: >60 mL/min (ref >60)
GFR calc non Af Amer: >60 mL/min (ref >60)
Glucose, Bld: 101 mg/dL — ABNORMAL HIGH (ref 65–99)
Potassium: 3.9 mmol/L (ref 3.5–5.1)
Sodium: 141 mmol/L (ref 135–145)

## 2015-09-04 LAB — CULTURE, BLOOD (ROUTINE X 2)

## 2015-09-04 LAB — CBC
HCT: 36 % (ref 36.0–46.0)
Hemoglobin: 11.8 g/dL — ABNORMAL LOW (ref 12.0–15.0)
MCH: 31.8 pg (ref 26.0–34.0)
MCHC: 32.8 g/dL (ref 30.0–36.0)
MCV: 97 fL (ref 78.0–100.0)
Platelets: 229 10*3/uL (ref 150–400)
RBC: 3.71 MIL/uL — ABNORMAL LOW (ref 3.87–5.11)
RDW: 12.9 % (ref 11.5–15.5)
WBC: 8 10*3/uL (ref 4.0–10.5)

## 2015-09-04 MED ORDER — CEFAZOLIN SODIUM-DEXTROSE 2-3 GM-% IV SOLR
2.0000 g | Freq: Three times a day (TID) | INTRAVENOUS | Status: DC
  Administered 2015-09-04 – 2015-09-07 (×8): 2 g via INTRAVENOUS
  Filled 2015-09-04 (×9): qty 50

## 2015-09-04 NOTE — Consult Note (Signed)
[place] for Infectious Disease  Total days of antibiotics 4        Day 4 vanco/piptazo               Reason for Consult: cellulitis    Referring Physician: regalado  Active Problems:   Sepsis ([place])   Chest pain   Lymphedema   Cellulitis of right lower extremity    HPI: [name] is a 34 y.o. female chronic lymphedema was recently treated for cellulitis that did not respond to oral abtx. Initially placed on a course of doxy on 9/29, seen by dermatology on 10/6 by ED notes, and changed abtx to cephalexin 539m BID on 10/6 but continued to worsen. She reports feeling feverish/chills worsening erythema to right lower extremity. Labs on admit show leukocytosis of 22K with 92N. She was started empirically on abtx with vanco and piptazo for which she has improved. Now wbc is normalized. Infectious work up showed blood cx in 1 of 2 sets from admit showing CoNS. Patient has remained afebrile during this admission. She denies having fevers prior to admit but did feel like she has chills at work prior on day of admission. She also follows up at wound care  ID consulted regarding possible bacteremia and management of cellulitis  Past Medical History  Diagnosis Date  . Hypertension   . [place]   . Lymphedema of both lower extremities     Allergies: No Known Allergies  MEDICATIONS: . enoxaparin (LOVENOX) injection  100 mg Subcutaneous Q24H  . piperacillin-tazobactam (ZOSYN)  IV  3.375 g Intravenous 3 times per day  . vancomycin  1,500 mg Intravenous Q8H    Social History  Substance Use Topics  . Smoking status: Never Smoker   . Smokeless tobacco: None  . Alcohol Use: No    Family History  Problem Relation Age of Onset  . Renal Disease Mother      Review of Systems  Constitutional: Negative for fever, chills, diaphoresis, activity change, appetite change, fatigue and unexpected weight change.  HENT: Negative for congestion, sore throat, rhinorrhea,  sneezing, trouble swallowing and sinus pressure.  Eyes: Negative for photophobia and visual disturbance.  Respiratory: Negative for cough, chest tightness, shortness of breath, wheezing and stridor.  Cardiovascular: Negative for chest pain, palpitations and leg swelling.  Gastrointestinal: Negative for nausea, vomiting, abdominal pain, diarrhea, constipation, blood in stool, abdominal distention and anal bleeding.  Genitourinary: Negative for dysuria, hematuria, flank pain and difficulty urinating.  Musculoskeletal: + leg pain from cellulitis Negative for myalgias, back pain, joint swelling, arthralgias and gait problem.  Skin: + right leg wound.Negative for color change, pallor, rash and wound.  Neurological: Negative for dizziness, tremors, weakness and light-headedness.  Hematological: Negative for adenopathy. Does not bruise/bleed easily.  Psychiatric/Behavioral: Negative for behavioral problems, confusion, sleep disturbance, dysphoric mood, decreased concentration and agitation.     OBJECTIVE: Temp:  [97.6 F (36.4 C)-98.1 F (36.7 C)] 97.6 F (36.4 C) (10/15 0628) Pulse Rate:  [94-95] 95 (10/15 0628) Resp:  [18] 18 (10/15 0628) BP: (142-145)/(92-93) 145/93 mmHg (10/15 0628) SpO2:  [95 %-98 %] 95 % (10/15 02035 Physical Exam  Constitutional:  oriented to person, place, and time. appears well-developed and well-nourished. No distress. Obese female laying in bed HENT: [place]/AT, PERRLA, no scleral icterus Mouth/Throat: Oropharynx is clear and moist. No oropharyngeal exudate.  Cardiovascular: Normal rate, regular rhythm and normal heart sounds. Exam reveals no gallop and no friction rub.  No murmur heard.  Pulmonary/Chest: Effort normal and breath sounds normal. No respiratory distress.  has no wheezes.  Neck = supple, no nuchal rigidity Abdominal: Soft. Bowel sounds are normal.  exhibits no distension. There is no tenderness.  Lymphadenopathy: no cervical adenopathy. No axillary  adenopathy. Marked bilateral lower extremity lymphedema Neurological: alert and oriented to person, place, and time.  Skin: Skin c/w lymphedema, has a dozen shallow open lesions to lateral right calf and one on inner calf. Good granulation bed. Still has blanching erythema up to proximal calf, not above knee. Psychiatric: a normal mood and affect.  behavior is normal.    LABS: Results for orders placed or performed during the hospital encounter of 09/01/15 (from the past 48 hour(s))  CBC     Status: Abnormal   Collection Time: 09/03/15  5:53 AM  Result Value Ref Range   WBC 8.2 4.0 - 10.5 K/uL   RBC 3.74 (L) 3.87 - 5.11 MIL/uL   Hemoglobin 12.0 12.0 - 15.0 g/dL   HCT 36.7 36.0 - 46.0 %   MCV 98.1 78.0 - 100.0 fL   MCH 32.1 26.0 - 34.0 pg   MCHC 32.7 30.0 - 36.0 g/dL   RDW 13.0 11.5 - 15.5 %   Platelets 223 150 - 400 K/uL  Basic metabolic panel     Status: Abnormal   Collection Time: 09/03/15  5:53 AM  Result Value Ref Range   Sodium 138 135 - 145 mmol/L   Potassium 4.0 3.5 - 5.1 mmol/L   Chloride 105 101 - 111 mmol/L   CO2 28 22 - 32 mmol/L   Glucose, Bld 92 65 - 99 mg/dL   BUN 8 6 - 20 mg/dL   Creatinine, Ser 0.52 0.44 - 1.00 mg/dL   Calcium 8.7 (L) 8.9 - 10.3 mg/dL   GFR calc non Af Amer >60 >60 mL/min   GFR calc Af Amer >60 >60 mL/min    Comment: (NOTE) The eGFR has been calculated using the CKD EPI equation. This calculation has not been validated in all clinical situations. eGFR's persistently <60 mL/min signify possible Chronic Kidney Disease.    Anion gap 5 5 - 15  Culture, blood (routine x 2)     Status: None (Preliminary result)   Collection Time: 09/03/15 10:50 AM  Result Value Ref Range   Specimen Description BLOOD LEFT ARM    Special Requests BOTTLES DRAWN AEROBIC AND ANAEROBIC 10CC    Culture      NO GROWTH 1 DAY Performed at [facility]    Report Status PENDING   Culture, blood (routine x 2)     Status: None (Preliminary result)   Collection  Time: 09/03/15 10:56 AM  Result Value Ref Range   Specimen Description BLOOD LEFT HAND    Special Requests BOTTLES DRAWN AEROBIC ONLY 5CC    Culture      NO GROWTH 1 DAY Performed at [facility]    Report Status PENDING   Vancomycin, trough     Status: None   Collection Time: 09/03/15  1:32 PM  Result Value Ref Range   Vancomycin Tr 15 10.0 - 20.0 ug/mL  CBC     Status: Abnormal   Collection Time: 09/04/15  6:05 AM  Result Value Ref Range   WBC 8.0 4.0 - 10.5 K/uL   RBC 3.71 (L) 3.87 - 5.11 MIL/uL   Hemoglobin 11.8 (L) 12.0 - 15.0 g/dL   HCT 36.0 36.0 - 46.0 %   MCV 97.0 78.0 - 100.0  fL   MCH 31.8 26.0 - 34.0 pg   MCHC 32.8 30.0 - 36.0 g/dL   RDW 12.9 11.5 - 15.5 %   Platelets 229 150 - 400 K/uL  Basic metabolic panel     Status: Abnormal   Collection Time: 09/04/15  6:05 AM  Result Value Ref Range   Sodium 141 135 - 145 mmol/L   Potassium 3.9 3.5 - 5.1 mmol/L   Chloride 104 101 - 111 mmol/L   CO2 30 22 - 32 mmol/L   Glucose, Bld 101 (H) 65 - 99 mg/dL   BUN 10 6 - 20 mg/dL   Creatinine, Ser 0.78 0.44 - 1.00 mg/dL   Calcium 8.8 (L) 8.9 - 10.3 mg/dL   GFR calc non Af Amer >60 >60 mL/min   GFR calc Af Amer >60 >60 mL/min    Comment: (NOTE) The eGFR has been calculated using the CKD EPI equation. This calculation has not been validated in all clinical situations. eGFR's persistently <60 mL/min signify possible Chronic Kidney Disease.    Anion gap 7 5 - 15    MICRO: 10/12 blood cx 1 of 2 CoNS 10/15 blood cx PENDING  IMAGING: U/S ruled out dvt  Assessment/Plan:  34yo F with chronic lymphedema who failed oral therapy, but possibly placed on subtherapeutic dose of cephalexin. Admitted for SIRS/sepsis due to cellulitis  - change iv antibiotics to cefazolin 2gm iv q 8hr . Will d/c vanco and d/c piptazo - if she is still slow to respond to IV, may consider giving oritavancin (long acting infusion for cellulitis, superficial abscess)   Bacteremia = likely  contaminant, await follow up on repeat blood culture  Health maintenance = will check for hiv ab  Erlinda Solinger B. [place] for Infectious Diseases [phone]

## 2015-09-04 NOTE — Progress Notes (Signed)
[facility] PROGRESS NOTE  Marlaine Hindrica R Broy HYQ:[id number]:[id number] DOB: Dec 22, 1980 DOA: 09/01/2015 PCP: No primary care provider on file.  Assessment/Plan:  1-Lower extremity cellulitis.  Sepsis; Bacteremia  Presents with fever, leukocytosis, Tachycardia,source LE cellulitis.   IV antibiotics. IV vancomycin and Zosyn day 3. lactic acid trending down 2.1  1 blood culture of 2 staph coagulase negative Repeat Blood cultures pending ID consulted.    2-Chest pain: Resolved, suspect GI vs MSK  This has resolved. D dimer only mildly elevated at 0.49.  ECHO normal Ef. and cycle cardiac enzymes negative.  Doppler LE limited, not LE DVT  3-Cellulitis of LE on super impose lymphedema:  IV antibiotics. Wound care consulted, local care.   Code Status: Full Code.  Family Communication: Care discussed with patient  Disposition Plan: Awaiting blood culture results.    Consultants:  none  Procedures: Doppler; Technically difficult and limited study. - No evidence of deep vein thrombosis involving the visualized   veins of the right lower extremity and left lower extremity.                                          ECHO; normal Ef. Diastolic dysfunction.  Antibiotics:  Vancomycin 10-12  Zosyn 10-12  HPI/Subjective: Feeling better, denies chest pain, no dyspnea.    Objective: Filed Vitals:   09/04/15 0628  BP: 145/93  Pulse: 95  Temp: 97.6 F (36.4 C)  Resp: 18    Intake/Output Summary (Last 24 hours) at 09/04/15 1222 Last data filed at 09/03/15 1428  Gross per 24 hour  Intake    240 ml  Output      0 ml  Net    240 ml   Filed Weights   09/01/15 0909 09/01/15 1300  Weight: 234.51 kg (517 lb) 234.51 kg (517 lb)    Exam:   General:  NAD  Cardiovascular: S 1, S 2 RRR  Respiratory: CTA  Abdomen: BS present, soft,  Musculoskeletal: Bilateral LE with lymphedema, edema redness, and ulceration more pronounced right  Data Reviewed: Basic Metabolic  Panel:  Recent Labs Lab 09/01/15 0925 09/02/15 0557 09/03/15 0553 09/04/15 0605  NA 138 134* 138 141  K 4.0 3.7 4.0 3.9  CL 105 102 105 104  CO2 27 27 28 30   GLUCOSE 108* 116* 92 101*  BUN 9 10 8 10   CREATININE 0.64 0.64 0.52 0.78  CALCIUM 9.0 8.0* 8.7* 8.8*   Liver Function Tests:  Recent Labs Lab 09/01/15 0925  AST 21  ALT 15  ALKPHOS 59  BILITOT 1.1  PROT 8.2*  ALBUMIN 4.0   No results for input(s): LIPASE, AMYLASE in the last 168 hours. No results for input(s): AMMONIA in the last 168 hours. CBC:  Recent Labs Lab 09/01/15 0925 09/02/15 0557 09/03/15 0553 09/04/15 0605  WBC 22.0* 13.0* 8.2 8.0  NEUTROABS 20.4*  --   --   --   HGB 13.3 11.1* 12.0 11.8*  HCT 40.8 34.9* 36.7 36.0  MCV 98.3 99.1 98.1 97.0  PLT 268 211 223 229   Cardiac Enzymes:  Recent Labs Lab 09/01/15 1810 09/01/15 2345 09/02/15 0557  TROPONINI <0.03 <0.03 <0.03   BNP (last 3 results) No results for input(s): BNP in the last 8760 hours.  ProBNP (last 3 results) No results for input(s): PROBNP in the last 8760 hours.  CBG: No results for input(s): GLUCAP in the last  168 hours.  Recent Results (from the past 240 hour(s))  Urine culture     Status: None   Collection Time: 09/01/15  9:13 AM  Result Value Ref Range Status   Specimen Description URINE, CLEAN CATCH  Final   Special Requests NONE  Final   Culture   Final    3,000 COLONIES/mL INSIGNIFICANT GROWTH Performed at [facility]    Report Status 09/02/2015 FINAL  Final  Culture, blood (routine x 2)     Status: None (Preliminary result)   Collection Time: 09/01/15  9:15 AM  Result Value Ref Range Status   Specimen Description BLOOD LEFT FOREARM  Final   Special Requests IN PEDIATRIC BOTTLE  Final   Culture   Final    NO GROWTH 2 DAYS Performed at [facility]    Report Status PENDING  Incomplete  Culture, blood (routine x 2)     Status: None   Collection Time: 09/01/15  9:25 AM  Result Value Ref  Range Status   Specimen Description BLOOD LEFT ANTECUBITAL  Final   Special Requests BOTTLES DRAWN AEROBIC AND ANAEROBIC  Final   Culture  Setup Time   Final    GRAM POSITIVE COCCI IN CLUSTERS IN BOTH AEROBIC AND ANAEROBIC BOTTLES CRITICAL RESULT CALLED TO, READ BACK BY AND VERIFIED WITH: C. CAGLE,RN AT 0865 ON [id number] BY Lucienne Capers    Culture   Final    STAPHYLOCOCCUS SPECIES (COAGULASE NEGATIVE) THE SIGNIFICANCE OF ISOLATING THIS ORGANISM FROM A SINGLE SET OF BLOOD CULTURES WHEN MULTIPLE SETS ARE DRAWN IS UNCERTAIN. PLEASE NOTIFY THE MICROBIOLOGY DEPARTMENT WITHIN ONE WEEK IF SPECIATION AND SENSITIVITIES ARE REQUIRED. Performed at [facility]    Report Status 09/04/2015 FINAL  Final  MRSA PCR Screening     Status: None   Collection Time: 09/01/15  1:03 PM  Result Value Ref Range Status   MRSA by PCR NEGATIVE NEGATIVE Final    Comment:        The GeneXpert MRSA Assay (FDA approved for NASAL specimens only), is one component of a comprehensive MRSA colonization surveillance program. It is not intended to diagnose MRSA infection nor to guide or monitor treatment for MRSA infections.      Studies: No results found.  Scheduled Meds: . enoxaparin (LOVENOX) injection  100 mg Subcutaneous Q24H  . piperacillin-tazobactam (ZOSYN)  IV  3.375 g Intravenous 3 times per day  . vancomycin  1,500 mg Intravenous Q8H   Continuous Infusions:    Active Problems:   Sepsis (HCC)   Chest pain   Lymphedema   Cellulitis of right lower extremity    Time spent: 35 minutes.     Hartley Barefoot A  [facility] Pager [phone]. If 7PM-7AM, please contact night-coverage at [email or web address], password [facility] 09/04/2015, 12:22 PM  LOS: 3 days

## 2015-09-04 NOTE — Progress Notes (Signed)
PT Cancellation Note  Patient Details Name: [name] MRN: [id number] DOB: [birth year]   Cancelled Treatment:    Reason Eval/Treat Not Completed: PT screened, no needs identified, will sign off (pt mobilizing I'ly, RN confirmed)   Drucilla ChaletWILLIAMS,Erandy Mceachern 09/04/2015, 11:59 AM

## 2015-09-05 LAB — CBC
HCT: 37.3 % (ref 36.0–46.0)
Hemoglobin: 12 g/dL (ref 12.0–15.0)
MCH: 31.8 pg (ref 26.0–34.0)
MCHC: 32.2 g/dL (ref 30.0–36.0)
MCV: 98.9 fL (ref 78.0–100.0)
Platelets: 280 10*3/uL (ref 150–400)
RBC: 3.77 MIL/uL — ABNORMAL LOW (ref 3.87–5.11)
RDW: 13.1 % (ref 11.5–15.5)
WBC: 8.5 10*3/uL (ref 4.0–10.5)

## 2015-09-05 LAB — HIV ANTIBODY (ROUTINE TESTING W REFLEX): HIV Screen 4th Generation wRfx: NONREACTIVE

## 2015-09-05 NOTE — Progress Notes (Signed)
[facility] PROGRESS NOTE  [name]:[id number] DOB: 1981/02/14 DOA: 09/01/2015 PCP: No primary care provider on file.  Assessment/Plan:  1-Lower extremity cellulitis.  Sepsis; Bacteremia  Presents with fever, leukocytosis, Tachycardia,source LE cellulitis.  received IV vancomycin and Zosyn day 3. Started on Ancef 10-15, day 2.  lactic acid trending down 2.1  1 blood culture of 2 staph coagulase negative, probably contaminant.  Repeat Blood cultures pending ID consulted.  Appreciate Dr Drue Second help/   2-Chest pain: Resolved, suspect GI vs MSK  This has resolved. D dimer only mildly elevated at 0.49.  ECHO normal Ef. and cycle cardiac enzymes negative.  Doppler LE limited, not LE DVT  3-Cellulitis of LE on super impose lymphedema:  IV antibiotics. Wound care consulted, local care.   Code Status: Full Code.  Family Communication: Care discussed with patient  Disposition Plan: Awaiting blood culture results.    Consultants:  none  Procedures: Doppler; Technically difficult and limited study. - No evidence of deep vein thrombosis involving the visualized   veins of the right lower extremity and left lower extremity.                                          ECHO; normal Ef. Diastolic dysfunction.  Antibiotics:  Vancomycin 10-12  Zosyn 10-12  HPI/Subjective: Feeling better, denies chest pain, no dyspnea.    Objective: Filed Vitals:   09/05/15 0544  BP: 135/85  Pulse: 92  Temp: 98.2 F (36.8 C)  Resp: 19    Intake/Output Summary (Last 24 hours) at 09/05/15 1456 Last data filed at 09/05/15 0412  Gross per 24 hour  Intake    480 ml  Output      0 ml  Net    480 ml   Filed Weights   09/01/15 0909 09/01/15 1300  Weight: 234.51 kg (517 lb) 234.51 kg (517 lb)    Exam:   General:  NAD  Cardiovascular: S 1, S 2 RRR  Respiratory: CTA  Abdomen: BS present, soft,  Musculoskeletal: Bilateral LE with lymphedema, edema redness, and  ulceration more pronounced right  Data Reviewed: Basic Metabolic Panel:  Recent Labs Lab 09/01/15 0925 09/02/15 0557 09/03/15 0553 09/04/15 0605  NA 138 134* 138 141  K 4.0 3.7 4.0 3.9  CL 105 102 105 104  CO2 GLUCOSE 108* 116* 92 101*  BUN CREATININE 0.64 0.64 0.52 0.78  CALCIUM 9.0 8.0* 8.7* 8.8*   Liver Function Tests:  Recent Labs Lab 09/01/15 0925  AST 21  ALT 15  ALKPHOS 59  BILITOT 1.1  PROT 8.2*  ALBUMIN 4.0   No results for input(s): LIPASE, AMYLASE in the last 168 hours. No results for input(s): AMMONIA in the last 168 hours. CBC:  Recent Labs Lab 09/01/15 0925 09/02/15 0557 09/03/15 0553 09/04/15 0605 09/05/15 0538  WBC 22.0* 13.0* 8.2 8.0 8.5  NEUTROABS 20.4*  --   --   --   --   HGB 13.3 11.1* 12.0 11.8* 12.0  HCT 40.8 34.9* 36.7 36.0 37.3  MCV 98.3 99.1 98.1 97.0 98.9  PLT 268 211 223 229 280   Cardiac Enzymes:  Recent Labs Lab 09/01/15 1810 09/01/15 2345 09/02/15 0557  TROPONINI <0.03 <0.03 <0.03   BNP (last 3 results) No results for input(s): BNP in the last 8760 hours.  ProBNP (last  3 results) No results for input(s): PROBNP in the last 8760 hours.  CBG: No results for input(s): GLUCAP in the last 168 hours.  Recent Results (from the past 240 hour(s))  Urine culture     Status: None   Collection Time: 09/01/15  9:13 AM  Result Value Ref Range Status   Specimen Description URINE, CLEAN CATCH  Final   Special Requests NONE  Final   Culture   Final    3,000 COLONIES/mL INSIGNIFICANT GROWTH Performed at [facility]    Report Status 09/02/2015 FINAL  Final  Culture, blood (routine x 2)     Status: None (Preliminary result)   Collection Time: 09/01/15  9:15 AM  Result Value Ref Range Status   Specimen Description BLOOD LEFT FOREARM  Final   Special Requests IN PEDIATRIC BOTTLE  Final   Culture   Final    NO GROWTH 4 DAYS Performed at [facility]    Report Status PENDING   Incomplete  Culture, blood (routine x 2)     Status: None   Collection Time: 09/01/15  9:25 AM  Result Value Ref Range Status   Specimen Description BLOOD LEFT ANTECUBITAL  Final   Special Requests BOTTLES DRAWN AEROBIC AND ANAEROBIC  Final   Culture  Setup Time   Final    GRAM POSITIVE COCCI IN CLUSTERS IN BOTH AEROBIC AND ANAEROBIC BOTTLES CRITICAL RESULT CALLED TO, READ BACK BY AND VERIFIED WITH: C. CAGLE,RN AT 1610 ON [id number] BY Lucienne Capers    Culture   Final    STAPHYLOCOCCUS SPECIES (COAGULASE NEGATIVE) THE SIGNIFICANCE OF ISOLATING THIS ORGANISM FROM A SINGLE SET OF BLOOD CULTURES WHEN MULTIPLE SETS ARE DRAWN IS UNCERTAIN. PLEASE NOTIFY THE MICROBIOLOGY DEPARTMENT WITHIN ONE WEEK IF SPECIATION AND SENSITIVITIES ARE REQUIRED. Performed at [facility]    Report Status 09/04/2015 FINAL  Final  MRSA PCR Screening     Status: None   Collection Time: 09/01/15  1:03 PM  Result Value Ref Range Status   MRSA by PCR NEGATIVE NEGATIVE Final    Comment:        The GeneXpert MRSA Assay (FDA approved for NASAL specimens only), is one component of a comprehensive MRSA colonization surveillance program. It is not intended to diagnose MRSA infection nor to guide or monitor treatment for MRSA infections.   Culture, blood (routine x 2)     Status: None (Preliminary result)   Collection Time: 09/03/15 10:50 AM  Result Value Ref Range Status   Specimen Description BLOOD LEFT ARM  Final   Special Requests BOTTLES DRAWN AEROBIC AND ANAEROBIC 10CC  Final   Culture   Final    NO GROWTH 2 DAYS Performed at [facility]    Report Status PENDING  Incomplete  Culture, blood (routine x 2)     Status: None (Preliminary result)   Collection Time: 09/03/15 10:56 AM  Result Value Ref Range Status   Specimen Description BLOOD LEFT HAND  Final   Special Requests BOTTLES DRAWN AEROBIC ONLY 5CC  Final   Culture   Final    NO GROWTH 2 DAYS Performed at [facility]     Report Status PENDING  Incomplete     Studies: No results found.  Scheduled Meds: .  ceFAZolin (ANCEF) IV  2 g Intravenous 3 times per day  . enoxaparin (LOVENOX) injection  100 mg Subcutaneous Q24H   Continuous Infusions:    Active Problems:   Sepsis (HCC)  Chest pain   Lymphedema   Cellulitis of right lower extremity    Time spent: 35 minutes.     Hartley [name], Jessica Beasley A  [facility] Pager [phone]. If 7PM-7AM, please contact night-coverage at [email or web address], password [email or web address] 09/05/2015, 2:56 PM  LOS: 4 days

## 2015-09-06 DIAGNOSIS — B957 Other staphylococcus as the cause of diseases classified elsewhere: Secondary | ICD-10-CM

## 2015-09-06 LAB — BASIC METABOLIC PANEL
Anion gap: 8 (ref 5–15)
BUN: 7 mg/dL (ref 6–20)
CO2: 30 mmol/L (ref 22–32)
Calcium: 9 mg/dL (ref 8.9–10.3)
Chloride: 103 mmol/L (ref 101–111)
Creatinine, Ser: 0.68 mg/dL (ref 0.44–1.00)
GFR calc Af Amer: >60 mL/min (ref >60)
GFR calc non Af Amer: >60 mL/min (ref >60)
Glucose, Bld: 98 mg/dL (ref 65–99)
Potassium: 3.9 mmol/L (ref 3.5–5.1)
Sodium: 141 mmol/L (ref 135–145)

## 2015-09-06 LAB — CBC
HCT: 36.6 % (ref 36.0–46.0)
Hemoglobin: 11.7 g/dL — ABNORMAL LOW (ref 12.0–15.0)
MCH: 31.1 pg (ref 26.0–34.0)
MCHC: 32 g/dL (ref 30.0–36.0)
MCV: 97.3 fL (ref 78.0–100.0)
Platelets: 253 10*3/uL (ref 150–400)
RBC: 3.76 MIL/uL — ABNORMAL LOW (ref 3.87–5.11)
RDW: 12.8 % (ref 11.5–15.5)
WBC: 9.6 10*3/uL (ref 4.0–10.5)

## 2015-09-06 LAB — CULTURE, BLOOD (ROUTINE X 2): Culture: NO GROWTH

## 2015-09-06 NOTE — Progress Notes (Signed)
[facility]   Date of Admission:  09/01/2015  Antibiotics: cefazolin  Subjective: Feels better  Objective: Temp:  [97.2 F (36.2 C)-98.3 F (36.8 C)] 97.7 F (36.5 C) (10/17 0544) Pulse Rate:  [90-94] 90 (10/17 0544) Resp:  [19-20] 19 (10/17 0544) BP: (141-145)/(78-97) 144/94 mmHg (10/17 0544) SpO2:  [93 %-96 %] 93 % (10/17 0544)  General: awake, nad Skin: no rashes Lungs: CTA  Right lower leg with less erythema, no warmth  A comprehensive review of systems was negative except for: Musculoskeletal: positive for leg swelling, decreased  Lab Results Lab Results  Component Value Date   WBC 9.6 09/06/2015   HGB 11.7* 09/06/2015   HCT 36.6 09/06/2015   MCV 97.3 09/06/2015   PLT 253 09/06/2015    Lab Results  Component Value Date   CREATININE 0.68 09/06/2015   BUN 7 09/06/2015   NA 141 09/06/2015   K 3.9 09/06/2015   CL 103 09/06/2015   CO2 30 09/06/2015    Lab Results  Component Value Date   ALT 15 09/01/2015   AST 21 09/01/2015   ALKPHOS 59 09/01/2015   BILITOT 1.1 09/01/2015      Microbiology: Recent Results (from the past 240 hour(s))  Urine culture     Status: None   Collection Time: 09/01/15  9:13 AM  Result Value Ref Range Status   Specimen Description URINE, CLEAN CATCH  Final   Special Requests NONE  Final   Culture   Final    3,000 COLONIES/mL INSIGNIFICANT GROWTH Performed at [facility]    Report Status 09/02/2015 FINAL  Final  Culture, blood (routine x 2)     Status: None (Preliminary result)   Collection Time: 09/01/15  9:15 AM  Result Value Ref Range Status   Specimen Description BLOOD LEFT FOREARM  Final   Special Requests IN PEDIATRIC BOTTLE 2ML  Final   Culture   Final    NO GROWTH 4 DAYS [facility]    Report Status PENDING  Incomplete  Culture, blood (routine x 2)     Status: None   Collection Time: 09/01/15  9:25 AM  Result Value Ref Range Status   Specimen  Description BLOOD LEFT ANTECUBITAL  Final   Special Requests BOTTLES DRAWN AEROBIC AND ANAEROBIC 5ML  Final   Culture  Setup Time   Final    GRAM POSITIVE COCCI IN CLUSTERS IN BOTH AEROBIC AND ANAEROBIC BOTTLES CRITICAL RESULT CALLED TO, READ BACK BY AND VERIFIED WITH: C. CAGLE,RN AT [id number] ON [id number] BY S. YARBROUGH    Culture   Final    STAPHYLOCOCCUS SPECIES (COAGULASE NEGATIVE) THE SIGNIFICANCE OF ISOLATING THIS ORGANISM FROM A SINGLE SET OF BLOOD CULTURES WHEN MULTIPLE SETS ARE DRAWN IS UNCERTAIN. PLEASE NOTIFY THE MICROBIOLOGY DEPARTMENT WITHIN ONE WEEK IF SPECIATION AND SENSITIVITIES ARE REQUIRED. [facility]formed [facility]    Report Status 09/04/2015 FINAL  Final  MRSA PCR Screening     Status: None   Collection Time: 09/01/15  1:03 PM  Result Value Ref Range Status   MRSA by PCR NEGATIVE NEGATIVE Final    Comment:        The GeneXpert MRSA Assay (FDA approved for NASAL specimens only), is one component of a comprehensive MRSA colonization surveillance program. It is not intended to diagnose MRSA infection nor to guide or monitor treatment for MRSA infections.   Culture, blood (routine x 2)     Status: None (Preliminary  result)   Collection Time: 09/03/15 10:50 AM  Result Value Ref Range Status   Specimen Description BLOOD LEFT ARM  Final   Special Requests BOTTLES DRAWN AEROBIC AND ANAEROBIC 10CC  Final   Culture   Final    NO GROWTH 2 DAYS Performed at [facility]    Report Status PENDING  Incomplete  Culture, blood (routine x 2)     Status: None (Preliminary result)   Collection Time: 09/03/15 10:56 AM  Result Value Ref Range Status   Specimen Description BLOOD LEFT HAND  Final   Special Requests BOTTLES DRAWN AEROBIC ONLY 5CC  Final   Culture   Final    NO GROWTH 2 DAYS Performed at [facility]    Report Status PENDING  Incomplete    Studies/Results: No results found.  Assessment/Plan:  1) cellulitis - improving on cefazolin.  Can  continue with Keflex 500 mg qid for another 10 days when ready for discharge.  I will sign off, thanks for consult  Staci Righter, MD [facility] [email or web address] [phone] pager   [phone] cell 09/06/2015, 12:34 PM

## 2015-09-06 NOTE — Progress Notes (Signed)
[facility] PROGRESS NOTE  Marlaine Hindrica R Magar WUJ:[id number]:[id number] DOB: [birth year] DOA: 09/01/2015 PCP: No primary care provider on file.  Assessment/Plan:  1-Lower extremity cellulitis.  Sepsis; Bacteremia  Presents with fever, leukocytosis, Tachycardia,source LE cellulitis.  received IV vancomycin and Zosyn day 3. Started on Ancef 10-15, day 3.  lactic acid trending down 2.1  1 blood culture of 2 staph coagulase negative, probably contaminant.  Repeat Blood cultures pending ID consulted.  Appreciate Dr Drue SecondSnider help/  Plan to discharge in 24 hours on keflex.   2-Chest pain: Resolved, suspect GI vs MSK  This has resolved. D dimer only mildly elevated at 0.49.  ECHO normal Ef. and cycle cardiac enzymes negative.  Doppler LE limited, not LE DVT  3-Cellulitis of LE on super impose lymphedema:  IV antibiotics. Wound care consulted, local care.   Code Status: Full Code.  Family Communication: Care discussed with patient  Disposition Plan: home in 24 hours.    Consultants:  none  Procedures: Doppler; Technically difficult and limited study. - No evidence of deep vein thrombosis involving the visualized   veins of the right lower extremity and left lower extremity.                                          ECHO; normal Ef. Diastolic dysfunction.  Antibiotics:  Vancomycin 10-12  Zosyn 10-12  HPI/Subjective: Feeling better, denies chest pain, no dyspnea.  Swelling decreased, redness decreased.   Objective: Filed Vitals:   09/06/15 1439  BP: 147/83  Pulse: 86  Temp: 98 F (36.7 C)  Resp: 20    Intake/Output Summary (Last 24 hours) at 09/06/15 1510 Last data filed at 09/05/15 2114  Gross per 24 hour  Intake    480 ml  Output      0 ml  Net    480 ml   Filed Weights   09/01/15 0909 09/01/15 1300  Weight: 234.51 kg (517 lb) 234.51 kg (517 lb)    Exam:   General:  NAD  Cardiovascular: S 1, S 2 RRR  Respiratory: CTA  Abdomen: BS present,  soft,  Musculoskeletal: Bilateral LE with lymphedema, edema redness, and ulceration more pronounced right  Data Reviewed: Basic Metabolic Panel:  Recent Labs Lab 09/01/15 0925 09/02/15 0557 09/03/15 0553 09/04/15 0605 09/06/15 0553  NA 138 134* 138 141 141  K 4.0 3.7 4.0 3.9 3.9  CL 105 102 105 104 103  CO2 27 27 28 30 30   GLUCOSE 108* 116* 92 101* 98  BUN 9 10 8 10 7   CREATININE 0.64 0.64 0.52 0.78 0.68  CALCIUM 9.0 8.0* 8.7* 8.8* 9.0   Liver Function Tests:  Recent Labs Lab 09/01/15 0925  AST 21  ALT 15  ALKPHOS 59  BILITOT 1.1  PROT 8.2*  ALBUMIN 4.0   No results for input(s): LIPASE, AMYLASE in the last 168 hours. No results for input(s): AMMONIA in the last 168 hours. CBC:  Recent Labs Lab 09/01/15 0925 09/02/15 0557 09/03/15 0553 09/04/15 0605 09/05/15 0538 09/06/15 0553  WBC 22.0* 13.0* 8.2 8.0 8.5 9.6  NEUTROABS 20.4*  --   --   --   --   --   HGB 13.3 11.1* 12.0 11.8* 12.0 11.7*  HCT 40.8 34.9* 36.7 36.0 37.3 36.6  MCV 98.3 99.1 98.1 97.0 98.9 97.3  PLT 268 211 223 229 280 253   Cardiac Enzymes:  Recent Labs Lab 09/01/15 1810 09/01/15 2345 09/02/15 0557  TROPONINI <0.03 <0.03 <0.03   BNP (last 3 results) No results for input(s): BNP in the last 8760 hours.  ProBNP (last 3 results) No results for input(s): PROBNP in the last 8760 hours.  CBG: No results for input(s): GLUCAP in the last 168 hours.  Recent Results (from the past 240 hour(s))  Urine culture     Status: None   Collection Time: 09/01/15  9:13 AM  Result Value Ref Range Status   Specimen Description URINE, CLEAN CATCH  Final   Special Requests NONE  Final   Culture   Final    3,000 COLONIES/mL INSIGNIFICANT GROWTH Performed at [facility]    Report Status 09/02/2015 FINAL  Final  Culture, blood (routine x 2)     Status: None   Collection Time: 09/01/15  9:15 AM  Result Value Ref Range Status   Specimen Description BLOOD LEFT FOREARM  Final   Special  Requests IN PEDIATRIC BOTTLE  Final   Culture   Final    NO GROWTH 5 DAYS Performed at [facility]    Report Status 09/06/2015 FINAL  Final  Culture, blood (routine x 2)     Status: None   Collection Time: 09/01/15  9:25 AM  Result Value Ref Range Status   Specimen Description BLOOD LEFT ANTECUBITAL  Final   Special Requests BOTTLES DRAWN AEROBIC AND ANAEROBIC  Final   Culture  Setup Time   Final    GRAM POSITIVE COCCI IN CLUSTERS IN BOTH AEROBIC AND ANAEROBIC BOTTLES CRITICAL RESULT CALLED TO, READ BACK BY AND VERIFIED WITH: C. CAGLE,RN AT 1610 ON [id number] BY Lucienne Capers    Culture   Final    STAPHYLOCOCCUS SPECIES (COAGULASE NEGATIVE) THE SIGNIFICANCE OF ISOLATING THIS ORGANISM FROM A SINGLE SET OF BLOOD CULTURES WHEN MULTIPLE SETS ARE DRAWN IS UNCERTAIN. PLEASE NOTIFY THE MICROBIOLOGY DEPARTMENT WITHIN ONE WEEK IF SPECIATION AND SENSITIVITIES ARE REQUIRED. Performed at [facility]    Report Status 09/04/2015 FINAL  Final  MRSA PCR Screening     Status: None   Collection Time: 09/01/15  1:03 PM  Result Value Ref Range Status   MRSA by PCR NEGATIVE NEGATIVE Final    Comment:        The GeneXpert MRSA Assay (FDA approved for NASAL specimens only), is one component of a comprehensive MRSA colonization surveillance program. It is not intended to diagnose MRSA infection nor to guide or monitor treatment for MRSA infections.   Culture, blood (routine x 2)     Status: None (Preliminary result)   Collection Time: 09/03/15 10:50 AM  Result Value Ref Range Status   Specimen Description BLOOD LEFT ARM  Final   Special Requests BOTTLES DRAWN AEROBIC AND ANAEROBIC 10CC  Final   Culture   Final    NO GROWTH 3 DAYS Performed at [facility]    Report Status PENDING  Incomplete  Culture, blood (routine x 2)     Status: None (Preliminary result)   Collection Time: 09/03/15 10:56 AM  Result Value Ref Range Status   Specimen Description BLOOD LEFT HAND   Final   Special Requests BOTTLES DRAWN AEROBIC ONLY 5CC  Final   Culture   Final    NO GROWTH 3 DAYS Performed at [facility]    Report Status PENDING  Incomplete     Studies: No results found.  Scheduled Meds: .  ceFAZolin (ANCEF) IV  2 g Intravenous 3 times per day  . enoxaparin (LOVENOX) injection  100 mg Subcutaneous Q24H   Continuous Infusions:    Active Problems:   Sepsis (HCC)   Chest pain   Lymphedema   Cellulitis of right lower extremity    Time spent: 35 minutes.     Hartley Barefoot A  [facility] Pager [phone]. If 7PM-7AM, please contact night-coverage at [email or web address], password [facility] 09/06/2015, 3:10 PM  LOS: 5 days

## 2015-09-07 MED ORDER — CEPHALEXIN 500 MG PO CAPS
500.0000 mg | ORAL_CAPSULE | Freq: Four times a day (QID) | ORAL | Status: DC
  Administered 2015-09-07: 500 mg via ORAL
  Filled 2015-09-07 (×4): qty 1

## 2015-09-07 MED ORDER — CEPHALEXIN 500 MG PO CAPS: 500.0000 mg | ORAL_CAPSULE | Freq: Four times a day (QID) | ORAL | Status: DC

## 2015-09-07 MED ORDER — HYDROCODONE-ACETAMINOPHEN 5-325 MG PO TABS: 1.0000 | ORAL_TABLET | ORAL | Status: DC | PRN

## 2015-09-07 NOTE — Progress Notes (Signed)
Pt is being discharged home. Discharge instructions were given to patient and family 

## 2015-09-07 NOTE — Discharge Summary (Signed)
Physician Discharge Summary  [name] ZOX:[id number] DOB: 02-Mar-1981 DOA: 09/01/2015  PCP: No primary care provider on file.  Admit date: 09/01/2015 Discharge date: 09/07/2015  Time spent: 35 minutes  Recommendations for Outpatient Follow-up:  Needs to follow up with wound care  Discharge Diagnoses:    Sepsis [facility])   Chest pain   Lymphedema   Cellulitis of right lower extremity   Discharge Condition: Stable  Diet recommendation: Heart Healthy  Filed Weights   09/01/15 0909 09/01/15 1300  Weight: 234.51 kg (517 lb) 234.51 kg (517 lb)    History of present illness:  HPI: [name] is a 34 y.o. female with PMH significant for chronic lymphedema, chronic wound, The patient was seen in the emergency department September 29 and was treated with doxycycline. She had a scheduled follow-up with dermatology on October 6, at that time they continued her antibiotics with cephalexin. She presents today with chills, and fevers. She report central chest pain today with associated cough, which has resolved.    Hospital Course:  1-Lower extremity cellulitis.  Sepsis; Bacteremia  Presents with fever, leukocytosis, Tachycardia,source LE cellulitis.  received IV vancomycin and Zosyn day 3. Started on Ancef 10-15, day 3.  lactic acid trending down 2.1  1 blood culture of 2 staph coagulase negative, probably contaminant.  Repeat Blood cultures pending ID consulted. Appreciate Dr Drue Second help/  Discharge on Keflex for 10 more days  2-Chest pain: Resolved, suspect GI vs MSK  This has resolved. D dimer only mildly elevated at 0.49.  ECHO normal Ef. and cycle cardiac enzymes negative.  Doppler LE limited, not LE DVT  3-Cellulitis of LE on super impose lymphedema:  IV antibiotics. Wound care consulted, local care.   Procedures:  none  Consultations:  ID  Discharge Exam: Filed Vitals:   09/07/15 0515  BP: 162/88  Pulse: 96  Temp: 98.2 F (36.8 C)  Resp: 20     General: NAD Cardiovascular: S 1, S 2 RRR Respiratory: CTA  Discharge Instructions   Discharge Instructions    Diet - low sodium heart healthy    Complete by:  As directed      Increase activity slowly    Complete by:  As directed           Current Discharge Medication List    START taking these medications   Details  HYDROcodone-acetaminophen (NORCO/VICODIN) 5-325 MG tablet Take 1-2 tablets by mouth every 4 (four) hours as needed for moderate pain. Qty: 30 tablet, Refills: 0      CONTINUE these medications which have CHANGED   Details  cephALEXin (KEFLEX) 500 MG capsule Take 1 capsule (500 mg total) by mouth every 6 (six) hours. Qty: 40 capsule, Refills: 0      CONTINUE these medications which have NOT CHANGED   Details  bacitracin-polymyxin b (POLYSPORIN) ointment Apply 1 application topically 2 (two) times daily as needed (for skin rash).    white petrolatum (VASELINE) GEL Apply 1 application topically as needed for dry skin.    hydrochlorothiazide (HYDRODIURIL) 25 MG tablet Take 1 tablet (25 mg total) by mouth daily. Qty: 30 tablet, Refills: 0      STOP taking these medications     ibuprofen (ADVIL,MOTRIN) 200 MG tablet      doxycycline (VIBRA-TABS) 100 MG tablet      oxyCODONE-acetaminophen (PERCOCET/ROXICET) 5-325 MG tablet        No Known Allergies    The results of significant diagnostics from this hospitalization (including imaging,  microbiology, ancillary and laboratory) are listed below for reference.    Significant Diagnostic Studies: Dg Chest 2 View  09/01/2015  CLINICAL DATA:  Chills and fever.  Chest pain.  Shortness of breath. EXAM: CHEST  2 VIEW COMPARISON:  02/03/2009 FINDINGS: Body habitus reduces diagnostic sensitivity and specificity. Upper normal heart size for technique. The patient is rotated to the left on today's radiograph, reducing diagnostic sensitivity and specificity. The lungs appear clear.  No pleural effusion.  IMPRESSION: 1.  No significant abnormality identified. Electronically Signed   By: Gaylyn RongWalter  Liebkemann M.D.   On: 09/01/2015 10:25    Microbiology: Recent Results (from the past 240 hour(s))  Urine culture     Status: None   Collection Time: 09/01/15  9:13 AM  Result Value Ref Range Status   Specimen Description URINE, CLEAN CATCH  Final   Special Requests NONE  Final   Culture   Final    3,000 COLONIES/mL INSIGNIFICANT GROWTH Performed at [facility]    Report Status 09/02/2015 FINAL  Final  Culture, blood (routine x 2)     Status: None   Collection Time: 09/01/15  9:15 AM  Result Value Ref Range Status   Specimen Description BLOOD LEFT FOREARM  Final   Special Requests IN PEDIATRIC BOTTLE 2ML  Final   Culture   Final    NO GROWTH 5 DAYS Performed at [facility]    Report Status 09/06/2015 FINAL  Final  Culture, blood (routine x 2)     Status: None   Collection Time: 09/01/15  9:25 AM  Result Value Ref Range Status   Specimen Description BLOOD LEFT ANTECUBITAL  Final   Special Requests BOTTLES DRAWN AEROBIC AND ANAEROBIC 5ML  Final   Culture  Setup Time   Final    GRAM POSITIVE COCCI IN CLUSTERS IN BOTH AEROBIC AND ANAEROBIC BOTTLES CRITICAL RESULT CALLED TO, READ BACK BY AND VERIFIED WITH: C. CAGLE,RN AT [id number] ON [id number] BY Lucienne CapersS. YARBROUGH    Culture   Final    STAPHYLOCOCCUS SPECIES (COAGULASE NEGATIVE) THE SIGNIFICANCE OF ISOLATING THIS ORGANISM FROM A SINGLE SET OF BLOOD CULTURES WHEN MULTIPLE SETS ARE DRAWN IS UNCERTAIN. PLEASE NOTIFY THE MICROBIOLOGY DEPARTMENT WITHIN ONE WEEK IF SPECIATION AND SENSITIVITIES ARE REQUIRED. Performed at [facility]    Report Status 09/04/2015 FINAL  Final  MRSA PCR Screening     Status: None   Collection Time: 09/01/15  1:03 PM  Result Value Ref Range Status   MRSA by PCR NEGATIVE NEGATIVE Final    Comment:        The GeneXpert MRSA Assay (FDA approved for NASAL specimens only), is one component of  a comprehensive MRSA colonization surveillance program. It is not intended to diagnose MRSA infection nor to guide or monitor treatment for MRSA infections.   Culture, blood (routine x 2)     Status: None (Preliminary result)   Collection Time: 09/03/15 10:50 AM  Result Value Ref Range Status   Specimen Description BLOOD LEFT ARM  Final   Special Requests BOTTLES DRAWN AEROBIC AND ANAEROBIC 10CC  Final   Culture   Final    NO GROWTH 3 DAYS Performed at [facility]    Report Status PENDING  Incomplete  Culture, blood (routine x 2)     Status: None (Preliminary result)   Collection Time: 09/03/15 10:56 AM  Result Value Ref Range Status   Specimen Description BLOOD LEFT HAND  Final   Special Requests BOTTLES DRAWN  AEROBIC ONLY 5CC  Final   Culture   Final    NO GROWTH 3 DAYS Performed at [facility]    Report Status PENDING  Incomplete     Labs: Basic Metabolic Panel:  Recent Labs Lab 09/01/15 0925 09/02/15 0557 09/03/15 0553 09/04/15 0605 09/06/15 0553  NA 138 134* 138 141 141  K 4.0 3.7 4.0 3.9 3.9  CL 105 102 105 104 103  CO2 GLUCOSE 108* 116* 92 101* 98  BUN CREATININE 0.64 0.64 0.52 0.78 0.68  CALCIUM 9.0 8.0* 8.7* 8.8* 9.0   Liver Function Tests:  Recent Labs Lab 09/01/15 0925  AST 21  ALT 15  ALKPHOS 59  BILITOT 1.1  PROT 8.2*  ALBUMIN 4.0   No results for input(s): LIPASE, AMYLASE in the last 168 hours. No results for input(s): AMMONIA in the last 168 hours. CBC:  Recent Labs Lab 09/01/15 0925 09/02/15 0557 09/03/15 0553 09/04/15 0605 09/05/15 0538 09/06/15 0553  WBC 22.0* 13.0* 8.2 8.0 8.5 9.6  NEUTROABS 20.4*  --   --   --   --   --   HGB 13.3 11.1* 12.0 11.8* 12.0 11.7*  HCT 40.8 34.9* 36.7 36.0 37.3 36.6  MCV 98.3 99.1 98.1 97.0 98.9 97.3  PLT 268 211 223 229 280 253   Cardiac Enzymes:  Recent Labs Lab 09/01/15 1810 09/01/15 2345 09/02/15 0557  TROPONINI <0.03 <0.03 <0.03    BNP: BNP (last 3 results) No results for input(s): BNP in the last 8760 hours.  ProBNP (last 3 results) No results for input(s): PROBNP in the last 8760 hours.  CBG: No results for input(s): GLUCAP in the last 168 hours.     Signed:  Hartley Barefoot A  [facility] 09/07/2015, 10:57 AM

## 2015-09-08 ENCOUNTER — Encounter (HOSPITAL_BASED_OUTPATIENT_CLINIC_OR_DEPARTMENT_OTHER): Payer: Self-pay | Attending: Surgery

## 2015-09-08 DIAGNOSIS — I890 Lymphedema, not elsewhere classified: Secondary | ICD-10-CM | POA: Insufficient documentation

## 2015-09-08 DIAGNOSIS — L97211 Non-pressure chronic ulcer of right calf limited to breakdown of skin: Secondary | ICD-10-CM | POA: Insufficient documentation

## 2015-09-08 DIAGNOSIS — Z6841 Body Mass Index (BMI) 40.0 and over, adult: Secondary | ICD-10-CM | POA: Insufficient documentation

## 2015-09-08 LAB — CULTURE, BLOOD (ROUTINE X 2)
Culture: NO GROWTH
Culture: NO GROWTH

## 2015-09-22 ENCOUNTER — Encounter (HOSPITAL_BASED_OUTPATIENT_CLINIC_OR_DEPARTMENT_OTHER): Payer: Self-pay | Attending: Surgery

## 2015-09-22 DIAGNOSIS — L97211 Non-pressure chronic ulcer of right calf limited to breakdown of skin: Secondary | ICD-10-CM | POA: Insufficient documentation

## 2015-09-22 DIAGNOSIS — Z6841 Body Mass Index (BMI) 40.0 and over, adult: Secondary | ICD-10-CM | POA: Insufficient documentation

## 2015-09-22 DIAGNOSIS — I87331 Chronic venous hypertension (idiopathic) with ulcer and inflammation of right lower extremity: Secondary | ICD-10-CM | POA: Insufficient documentation

## 2015-09-22 DIAGNOSIS — I890 Lymphedema, not elsewhere classified: Secondary | ICD-10-CM | POA: Insufficient documentation

## 2015-09-22 DIAGNOSIS — L97811 Non-pressure chronic ulcer of other part of right lower leg limited to breakdown of skin: Secondary | ICD-10-CM | POA: Insufficient documentation

## 2015-10-27 ENCOUNTER — Encounter (HOSPITAL_BASED_OUTPATIENT_CLINIC_OR_DEPARTMENT_OTHER): Payer: Self-pay | Attending: Surgery

## 2015-10-27 DIAGNOSIS — I87331 Chronic venous hypertension (idiopathic) with ulcer and inflammation of right lower extremity: Secondary | ICD-10-CM | POA: Insufficient documentation

## 2015-10-27 DIAGNOSIS — Z6841 Body Mass Index (BMI) 40.0 and over, adult: Secondary | ICD-10-CM | POA: Insufficient documentation

## 2015-10-27 DIAGNOSIS — L97811 Non-pressure chronic ulcer of other part of right lower leg limited to breakdown of skin: Secondary | ICD-10-CM | POA: Insufficient documentation

## 2015-10-27 DIAGNOSIS — I890 Lymphedema, not elsewhere classified: Secondary | ICD-10-CM | POA: Insufficient documentation

## 2015-11-24 ENCOUNTER — Encounter (HOSPITAL_BASED_OUTPATIENT_CLINIC_OR_DEPARTMENT_OTHER): Payer: Self-pay | Attending: Surgery

## 2015-11-24 DIAGNOSIS — L97811 Non-pressure chronic ulcer of other part of right lower leg limited to breakdown of skin: Secondary | ICD-10-CM | POA: Insufficient documentation

## 2015-11-24 DIAGNOSIS — Z6841 Body Mass Index (BMI) 40.0 and over, adult: Secondary | ICD-10-CM | POA: Insufficient documentation

## 2015-11-24 DIAGNOSIS — I890 Lymphedema, not elsewhere classified: Secondary | ICD-10-CM | POA: Insufficient documentation

## 2015-11-24 DIAGNOSIS — I87331 Chronic venous hypertension (idiopathic) with ulcer and inflammation of right lower extremity: Secondary | ICD-10-CM | POA: Insufficient documentation

## 2015-12-13 ENCOUNTER — Emergency Department (HOSPITAL_COMMUNITY)
Admission: EM | Admit: 2015-12-13 | Discharge: 2015-12-13 | Disposition: A | Discharge status: Home / Self Care | Payer: Self-pay | Attending: Emergency Medicine | Admitting: Emergency Medicine

## 2015-12-13 ENCOUNTER — Ambulatory Visit: Payer: BC Managed Care – PPO | Admitting: Podiatry

## 2015-12-13 ENCOUNTER — Encounter (HOSPITAL_COMMUNITY): Payer: Self-pay | Admitting: Oncology

## 2015-12-13 DIAGNOSIS — K002 Abnormalities of size and form of teeth: Secondary | ICD-10-CM | POA: Insufficient documentation

## 2015-12-13 DIAGNOSIS — I10 Essential (primary) hypertension: Secondary | ICD-10-CM | POA: Insufficient documentation

## 2015-12-13 DIAGNOSIS — K047 Periapical abscess without sinus: Secondary | ICD-10-CM | POA: Insufficient documentation

## 2015-12-13 DIAGNOSIS — K029 Dental caries, unspecified: Secondary | ICD-10-CM | POA: Insufficient documentation

## 2015-12-13 DIAGNOSIS — Z792 Long term (current) use of antibiotics: Secondary | ICD-10-CM | POA: Insufficient documentation

## 2015-12-13 MED ORDER — BUPIVACAINE-EPINEPHRINE (PF) 0.5% -1:200000 IJ SOLN
1.8000 mL | Freq: Once | INTRAMUSCULAR | Status: AC
  Administered 2015-12-13: 1.8 mL
  Filled 2015-12-13 (×2): qty 1.8

## 2015-12-13 MED ORDER — CLINDAMYCIN HCL 150 MG PO CAPS: 450.0000 mg | ORAL_CAPSULE | Freq: Three times a day (TID) | ORAL | Status: AC

## 2015-12-13 MED ORDER — CLINDAMYCIN HCL 300 MG PO CAPS
450.0000 mg | ORAL_CAPSULE | Freq: Once | ORAL | Status: AC
  Administered 2015-12-13: 450 mg via ORAL
  Filled 2015-12-13: qty 1

## 2015-12-13 MED ORDER — HYDROCODONE-ACETAMINOPHEN 5-325 MG PO TABS: 2.0000 | ORAL_TABLET | ORAL | Status: DC | PRN

## 2015-12-13 MED ORDER — HYDROCODONE-ACETAMINOPHEN 5-325 MG PO TABS
2.0000 | ORAL_TABLET | Freq: Once | ORAL | Status: AC
  Administered 2015-12-13: 2 via ORAL
  Filled 2015-12-13: qty 2

## 2015-12-13 NOTE — ED Notes (Signed)
Pt arrives to ED with c/o of dental pain and facial swelling. Pt states that she woke up Sunday morning with upper jaw pain and swelling. Pt's states she has been taking ibuprofen  and an antibiotic, for an unrelated issue, with no relief. Pt states she has a had a fever, however, no fever evident in triage. Pt does not have a regular dentist.  Swelling noted to left side of pt's face. Pt in NAD

## 2015-12-13 NOTE — ED Provider Notes (Signed)
CSN: [id number]     Arrival date & time 12/13/15  0355 History   First MD Initiated Contact with Patient 12/13/15 0422     Chief Complaint  Patient presents with  . Dental Pain     (Consider location/radiation/quality/duration/timing/severity/associated sxs/prior Treatment) HPI   Patient is a 35 year old female with history of hypertension, morbid obesity, lymphedema and currently being treated for right lower extremity wound with cellulitis, she presents to the emergency room with one day of dental pain with associated swelling and fever.  She has multiple broken teeth and has been unable to see a dentist. She woke up yesterday morning with dental pain and swelling of her upper lip and cheek. She states she took ibuprofen and had partial relief throughout the day and the swelling improved during the daytime. She woke tonight with worsening swelling and increased pain, with fever of 101.0. She again took ibuprofen and then came to the ER for evaluation.  She rates her pain 10 out of 10, described as throbbing, aching and shooting.  Patient denies sweats, chills, nausea, vomiting, shortness of breath, chest pain, abdominal pain, difficulty breathing, headaches. She is able to tolerate her secretions.    Past Medical History  Diagnosis Date  . Hypertension   . Morbid obesity (HCC)   . Lymphedema of both lower extremities    No past surgical history on file. Family History  Problem Relation Age of Onset  . Renal Disease Mother    Social History  Substance Use Topics  . Smoking status: Never Smoker   . Smokeless tobacco: None  . Alcohol Use: No   OB History    Gravida Para Term Preterm AB TAB SAB Ectopic Multiple Living   0              Review of Systems  All other systems reviewed and are negative.     Allergies  Review of patient's allergies indicates no known allergies.  Home Medications   Prior to Admission medications   Medication Sig Start Date End Date Taking?  Authorizing Provider  ibuprofen (ADVIL,MOTRIN) 200 MG tablet Take 800 mg by mouth every 6 (six) hours as needed for moderate pain.   Yes Historical Provider, MD  cephALEXin (KEFLEX) 500 MG capsule Take 1 capsule (500 mg total) by mouth every 6 (six) hours. 09/07/15   Belkys A Regalado, MD  clindamycin (CLEOCIN) 150 MG capsule Take 3 capsules (450 mg total) by mouth 3 (three) times daily. 12/13/15 12/20/15  Danelle Berry, PA-C  hydrochlorothiazide (HYDRODIURIL) 25 MG tablet Take 1 tablet (25 mg total) by mouth daily. Patient not taking: Reported on 04/25/2015 02/28/12 02/27/13  Cherrie Distance, PA-C  HYDROcodone-acetaminophen (NORCO/VICODIN) 5-325 MG tablet Take 2 tablets by mouth every 4 (four) hours as needed. 12/13/15   Danelle Berry, PA-C   BP 148/89 mmHg  Pulse 99  Temp(Src) 98.7 F (37.1 C) (Oral)  Resp 17  SpO2 100%  LMP 12/07/2015 Physical Exam  Constitutional: She is oriented to person, place, and time. She appears well-developed and well-nourished. No distress.  Morbidly obese female, does not appear distressed  HENT:  Head: Normocephalic and atraumatic.  Right Ear: External ear normal.  Left Ear: External ear normal.  Nose: Sinus tenderness present. No mucosal edema, rhinorrhea or nasal deformity.    Mouth/Throat: Uvula is midline, oropharynx is clear and moist and mucous membranes are normal. Mucous membranes are not pale, not dry and not cyanotic. No trismus in the jaw. Abnormal dentition. Dental abscesses and dental  caries present. No uvula swelling. No oropharyngeal exudate, posterior oropharyngeal edema, posterior oropharyngeal erythema or tonsillar abscesses.    Bilateral nares patent  Eyes: Conjunctivae, EOM and lids are normal. Pupils are equal, round, and reactive to light. Right eye exhibits no discharge. Left eye exhibits no discharge. No scleral icterus.  Neck: Trachea normal, normal range of motion, full passive range of motion without pain and phonation normal. Neck supple.  No JVD present. No spinous process tenderness and no muscular tenderness present. No rigidity. No tracheal deviation, no edema, no erythema and normal range of motion present.  Cardiovascular: Normal rate and regular rhythm.   Pulmonary/Chest: Effort normal and breath sounds normal. No stridor. No respiratory distress.  Musculoskeletal: Normal range of motion. She exhibits no edema.  Lymphadenopathy:    She has no cervical adenopathy.  Neurological: She is alert and oriented to person, place, and time. She exhibits normal muscle tone. Coordination normal.  Skin: Skin is warm and dry. No rash noted. She is not diaphoretic. No erythema. No pallor.  Psychiatric: She has a normal mood and affect. Her behavior is normal. Judgment and thought content normal.  Nursing note and vitals reviewed.   ED Course  Procedures (including critical care time) Labs Review Labs Reviewed - No data to display  INCISION AND DRAINAGE Performed by: Danelle Berry Consent: Verbal consent obtained. Risks and benefits: risks, benefits and alternatives were discussed Time out performed prior to procedure Type: dental abscess (periapical) Body area: left upper jaw superior to tooth #10 Anesthesia: local infiltration Incision was made with a scalpel. Local anesthetic:bupivacaine-epinephrine (MARCAINE W/ EPI) 0.5% -1:200000 injection 1.8 mL Anesthetic total: 1.8 mL Complexity: complex Blunt dissection to break up loculations Drainage: purulent, bloody, malodorous  Drainage amount: copious Packing material: n/a Patient tolerance: Patient tolerated the procedure well with no immediate complications.   Imaging Review No results found. I have personally reviewed and evaluated these images and lab results as part of my medical decision-making.   EKG Interpretation None      MDM   Pt presents with dental pain with swelling, was found to have a periapical abscess over tooth #10 Abscess was drained without  complications Pt reports mild fever prior to presentation and ER, afebrile at triage, though mild tachycardia and hypertension, likely secondary to pain. Patient was nontoxic in appearance, able to eat and drink without difficulty.   She took her first dose of clindamycin in the ER. She was given prescription for clindamycin and a GoodRx coupon as well.   There is no on-call dental provider or oral surgeon. She was provided with resource guide and strongly encouraged to find follow-up this week.  Return precautions reviewed. If patient has worsening pain, swelling, new erythema, or worsening constitutional symptoms such as continued fever with additional nausea, vomiting ( after 48 hours of abx tx), she is encouraged to return to the ER for reevaluation.  Discharged with pain improved, safe to D/C home, VSS Filed Vitals:   12/13/15 0417 12/13/15 0558  BP: 151/99 148/89  Pulse: 109 99  Temp: 98.7 F (37.1 C)   TempSrc: Oral   Resp: 18 17  SpO2: 91% 100%    Final diagnoses:  Periapical abscess     Danelle Berry, PA-C 12/13/15 9604  Azalia Bilis, MD 12/13/15 [phone]

## 2015-12-13 NOTE — Discharge Instructions (Signed)
Dental Abscess °A dental abscess is a collection of pus in or around a tooth. °CAUSES °This condition is caused by a bacterial infection around the root of the tooth that involves the inner part of the tooth (pulp). It may result from: °· Severe tooth decay. °· Trauma to the tooth that allows bacteria to enter into the pulp, such as a broken or chipped tooth. °· Severe gum disease around a tooth. °SYMPTOMS °Symptoms of this condition include: °· Severe pain in and around the infected tooth. °· Swelling and redness around the infected tooth, in the mouth, or in the face. °· Tenderness. °· Pus drainage. °· Bad breath. °· Bitter taste in the mouth. °· Difficulty swallowing. °· Difficulty opening the mouth. °· Nausea. °· Vomiting. °· Chills. °· Swollen neck glands. °· Fever. °DIAGNOSIS °This condition is diagnosed with examination of the infected tooth. During the exam, your dentist may tap on the infected tooth. Your dentist will also ask about your medical and dental history and may order X-rays. °TREATMENT °This condition is treated by eliminating the infection. This may be done with: °· Antibiotic medicine. °· A root canal. This may be performed to save the tooth. °· Pulling (extracting) the tooth. This may also involve draining the abscess. This is done if the tooth cannot be saved. °HOME CARE INSTRUCTIONS °· Take medicines only as directed by your dentist. °· If you were prescribed antibiotic medicine, finish all of it even if you start to feel better. °· Rinse your mouth (gargle) often with salt water to relieve pain or swelling. °· Do not drive or operate heavy machinery while taking pain medicine. °· Do not apply heat to the outside of your mouth. °· Keep all follow-up visits as directed by your dentist. This is important. °SEEK MEDICAL CARE IF: °· Your pain is worse and is not helped by medicine. °SEEK IMMEDIATE MEDICAL CARE IF: °· You have a fever or chills. °· Your symptoms suddenly get worse. °· You have a  very bad headache. °· You have problems breathing or swallowing. °· You have trouble opening your mouth. °· You have swelling in your neck or around your eye. °  °This information is not intended to replace advice given to you by your health care provider. Make sure you discuss any questions you have with your health care provider. °  °Document Released: 11/06/2005 Document Revised: 03/23/2015 Document Reviewed: 11/03/2014 °Elsevier Interactive Patient Education ©2016 Elsevier Inc. ° °Emergency Department Resource Guide °1) Find a Doctor and Pay Out of Pocket °Although you won't have to find out who is covered by your insurance plan, it is a good idea to ask around and get recommendations. You will then need to call the office and see if the doctor you have chosen will accept you as a new patient and what types of options they offer for patients who are self-pay. Some doctors offer discounts or will set up payment plans for their patients who do not have insurance, but you will need to ask so you aren't surprised when you get to your appointment. ° °2) Contact Your Local Health Department °Not all health departments have doctors that can see patients for sick visits, but many do, so it is worth a call to see if yours does. If you don't know where your local health department is, you can check in your phone book. The CDC also has a tool to help you locate your state's health department, and many state websites also have listings   of all of their local health departments. ° °3) Find a Walk-in Clinic °If your illness is not likely to be very severe or complicated, you may want to try a walk in clinic. These are popping up all over the country in pharmacies, drugstores, and shopping centers. They're usually staffed by nurse practitioners or physician assistants that have been trained to treat common illnesses and complaints. They're usually fairly quick and inexpensive. However, if you have serious medical issues or  chronic medical problems, these are probably not your best option. ° °No Primary Care Doctor: °- Call [facility] at  832-8000 - they can help you locate a primary care doctor that  accepts your insurance, provides certain services, etc. °- Physician Referral Service- 1-800-533-3463 ° °Chronic Pain Problems: °Organization         Address  Phone   Notes  °[facility]  (336) 297-2271 Patients need to be referred by their primary care doctor.  ° °Medication Assistance: °Organization         Address  Phone   Notes  °[place] [facility] [address] °[place], [place] [address] (336) 641-8030 --Must be a resident of [place] °-- Must have NO insurance coverage whatsoever (no Medicaid/ Medicare, etc.) °-- The pt. MUST have a primary care doctor that directs their care regularly and follows them in the community °  °[facility]  (866) 331-1348   °[employer]  (888) 892-1162   ° °Agencies that provide inexpensive medical care: °Organization         Address  Phone   Notes  °[facility]  (336) 832-8035   °[facility]    (336) 832-7272   °[facility] [address] °[place], [place] [address] (336) 832-4777   °[facility] [address], °[place] (336) 271-4999   °[facility]    (336) 373-0678   °[facility]    (336) 272-1050   °[facility] ° [address], [place] Phone:  (336) 832-4444, Fax:  (336) 832-4440 Hours of Operation:  9 am - 6 pm, M-F.  Also accepts Medicaid/Medicare and self-pay.  °[facility] ° [address], [place] Phone: (336) 832-3150, Fax: (336) 832-3151. Hours of Operation:  8:30 am - 5:30 pm, M-F.  Also accepts Medicaid and self-pay.  °[facility] [place] [address], [place] Phone: (336) 878-6027   °[facility] [address], [place], [place]  (336)723-1848, Ext. 123 Mondays & Thursdays: 7-9 AM.  First 15 patients are seen on a first come, first serve basis. °  ° °Medicaid-accepting [place] Providers: ° °Organization         Address  Phone   Notes  °[facility] [address], [place] (336) 641-2100 Also accepts self-pay patients.  °[facility] [address], [place] ° (336) 856-9996   °[facility] [address], [place] (336) 288-8857   °[facility] [address] [place] [address], [place] (336) 299-7000   °Veita Bland [address], [place]  ° (336) 373-1557 Only accepts [place] Medicaid patients after they have their name applied to their card.  ° °Self-Pay (no insurance) in [place]: ° °Organization         Address  Phone   Notes  °  Sickle Cell Patients, [facility] [address], [place] (336) 832-1970   °[facility] [address], [place] (336) 832-4400   °[facility] ° [address], [place] (336) 992-4800   °[facility]/Dr. Osei-Bonsu ° [address] [place] [address], [place] or [address], [place] (336) 841-8500 Phone number for both [place] and [place] locations is the same.  °[facility] [address], [place] (336) 299-0000   °[facility] [address] [place] [address], [place] or [address] (336) 852-7530 °(336) 878-2260   °[facility] [address], [place] (336) 350-1642, phone; (336) 294-5005, fax Sees patients 1st and 3rd Saturday of every month.  Must not qualify for public or private insurance (i.e. Medicaid, Medicare, [place] Health Choice, Veterans' Benefits) • Household income should be no more than 200% of the poverty level •The clinic cannot treat you if you are pregnant or think you are pregnant • Sexually transmitted  diseases are not treated at the clinic.  ° ° °Dental Care: °Organization         Address  Phone  Notes  °[facility] [address], [place] (336) 641-6152 Accepts children up to age 21 who are enrolled in Medicaid or [place] Health Choice; pregnant women with a Medicaid card; and children who have applied for Medicaid or [place] Health Choice, but were declined, whose parents can pay a reduced fee at time of service.  °[facility] [place]  [address], [place] (336) 641-7733 Accepts children up to age 21 who are enrolled in Medicaid or [place] Health Choice; pregnant women with a Medicaid card; and children who have applied for Medicaid or [place] Health Choice, but were declined, whose parents can pay a reduced fee at time of service.  °[facility] ° [address], [place] (336) 641-4533 Patients are seen by appointment only. Walk-ins are not accepted. [facility] will see patients 18 years of age and older. °Monday - Tuesday (8am-5pm) °Most Wednesdays (8:30-5pm) °$30 per visit, cash only  °[facility] ° [address], [place] (336) 641-4533 Patients are seen by appointment only. Walk-ins are not accepted. [facility] will see patients 18 years of age and older. °One Wednesday Evening (Monthly: Volunteer Based).  $30 per visit, cash only  °[facility]  (919) 537-3737 for adults; Children under age 4, call Graduate Pediatric Dentistry at (919) [phone]. Children aged 4-14, please call (919) [phone] to request a pediatric application. ° Dental services are provided in all areas of dental care including fillings, crowns and bridges, complete and partial dentures, implants, gum treatment, root canals, and extractions. Preventive care is also provided. Treatment is provided to both adults and children. °Patients are selected via a  lottery and there is often a waiting list. °  °[facility] [address], °[place] ° (336) 763-8833 [email or web address] °  °[facility] [address], [place], [place] (336)723-1848, Ext. 123 Second and Fourth Thursday of each month, opens at 6:30 AM; Clinic ends at 9 AM.  Patients are seen on a first-come first-served basis, and a limited number are seen during each clinic.  ° °[facility] ° [address], [place],  (336) 723-7904   Eligibility   Requirements °You must have lived in [place], [place], or [place] counties for at least the last three months. °  You cannot be eligible for state or federal sponsored healthcare insurance, including Veterans Administration, Medicaid, or Medicare. °  You generally cannot be eligible for healthcare insurance through your employer.  °  How to apply: °Eligibility screenings are held every Tuesday and Wednesday afternoon from 1:00 pm until 4:00 pm. You do not need an appointment for the interview!  °[facility] [address], [place], [place] 336-631-2330   °[facility]  336-342-8273   °[facility]  336-703-3100   °[facility]  336-570-6415   ° °Behavioral Health Resources in the Community: °Intensive Outpatient Programs °Organization         Address  Phone  Notes  °[facility] [address], [place], [place] 336-878-6098   °[facility] [address], [place], [place] 336-832-9800   °[facility] [address], [place], [place] ° 336-882-2125   °[facility] [address],  °[place], [place] 1-800-853-5163 or 336-641-4981   °Substance Abuse Resources °Organization         Address  Phone  Notes  °[facility]  336-882-2125   °[facility]  336-784-9470   °[facility]  336-285-9073   °[facility]  336-845-3988   °Residential &  Outpatient Substance Abuse Program  1-800-659-3381   °Psychological Services °Organization         Address  Phone  Notes  °[facility]  336- 832-9600   °[facility]  336- 378-7881   °[facility] [address], [place] 1-800-853-5163 or 336-641-4981   ° °Mobile Crisis Teams °Organization         Address  Phone  Notes  °[facility], Mobile Crisis Care Unit  1-877-626-1772   °Assertive °[facility] ° [address] [place], [place] 336-834-9664   °Sharon DeEsch [address] °[place] 336-554-5454   ° °Self-Help/Support Groups °Organization         Address  Phone             Notes  °[facility] - variety of support groups  336- 373-1402 Call for more information  °Narcotics Anonymous (NA), [facility] [address], °[place]  2 meetings at this location  ° °Residential Treatment Programs °Organization         Address  Phone  Notes  °[facility] [address],    °[place]  1-866-801-8205   °[facility] ° [address], [place], [place] 704-293-8524   °[facility] [address], [place] 336-845-3988 Admissions: 8am-3pm M-F  °[facility] [address]    °[place], [place] 336-841-1104   °[facility] [address], [place], [place] 336-379-7146   °[facility] [address]  °[place], [place] 336-285-9073   °[facility] - Intensive Outpatient [address], , [place] 336-852-3033   °[facility] [address]  °[place], [place] 1-877-615-2722 or 336-784-9470   °[facility] [address] [place], [place] 336-227-7417 Accepts Medicaid  °[facility] [address]  ° [place] 1-800-659-3381 Substance Abuse/Addiction Treatment  ° °[place] Behavioral Health Resources °Organization            Address  Phone  Notes  °[facility]  (888) 581-9988   °Julie Brannon, PhD [address] [place], [place]   (336) 349-5553 or (336) 951-0000   °[facility]   [address] °[place], [place] (336) 349-4454   °[facility] [address], [place], [place] (336) 342-8316 Insurance/Medicaid/sponsorship through [facility]  °[facility] [address]                                    [place], [place] (336) 342-8316 Therapy/tele-psych/case  °[facility] [address]  ° [place], [place] (336) 349-2233    °Dr. Arfeen  (336) 349-4544   °[facility] [place]  [employer] [place] [facility] 1) [address], [place] °2) [address], [place] °3)  [address], [place] (336) 349-3220 °(336) 342-7768 ° °(336) 342-8140   °[place] [facility] (336) 342-1394 or (336) 342-3537 (After Hours)    ° ° °

## 2015-12-28 ENCOUNTER — Encounter (HOSPITAL_BASED_OUTPATIENT_CLINIC_OR_DEPARTMENT_OTHER): Payer: Self-pay | Attending: Surgery

## 2016-02-04 ENCOUNTER — Encounter (HOSPITAL_COMMUNITY): Payer: Self-pay | Admitting: *Deleted

## 2016-02-04 ENCOUNTER — Emergency Department (HOSPITAL_COMMUNITY)
Admission: EM | Admit: 2016-02-04 | Discharge: 2016-02-04 | Disposition: A | Discharge status: Home / Self Care | Payer: BLUE CROSS/BLUE SHIELD | Attending: Emergency Medicine | Admitting: Emergency Medicine

## 2016-02-04 DIAGNOSIS — I10 Essential (primary) hypertension: Secondary | ICD-10-CM | POA: Diagnosis not present

## 2016-02-04 DIAGNOSIS — Z792 Long term (current) use of antibiotics: Secondary | ICD-10-CM | POA: Insufficient documentation

## 2016-02-04 DIAGNOSIS — L739 Follicular disorder, unspecified: Secondary | ICD-10-CM

## 2016-02-04 DIAGNOSIS — L03115 Cellulitis of right lower limb: Secondary | ICD-10-CM | POA: Diagnosis not present

## 2016-02-04 DIAGNOSIS — R2241 Localized swelling, mass and lump, right lower limb: Secondary | ICD-10-CM | POA: Diagnosis present

## 2016-02-04 MED ORDER — SULFAMETHOXAZOLE-TRIMETHOPRIM 800-160 MG PO TABS
1.0000 | ORAL_TABLET | Freq: Once | ORAL | Status: AC
  Administered 2016-02-04: 1 via ORAL
  Filled 2016-02-04: qty 1

## 2016-02-04 MED ORDER — SULFAMETHOXAZOLE-TRIMETHOPRIM 800-160 MG PO TABS: 1.0000 | ORAL_TABLET | Freq: Two times a day (BID) | ORAL | Status: DC

## 2016-02-04 NOTE — Consult Note (Signed)
WOC consulted for LE wounds secondary to lymphedema, patient pending DC to home.  WOC nurse will not be available on this campus until much later in the day. Contacted bedside nurse to discuss current wounds.  Inpatient WOC  Team does not treat lymphadema.  We discussed topical care for the open areas, the bedside nurse reports they are draining but not significantly.  Started on PO antibiotic per MD.  Recommended xeroform, nonadherent with antibacterial properties, cover with dry dressing kerlix and ACE to hold in place, or Coban, but not too tight as to not compress centrally on the leg.   Patient to follow up with wound care center, she has been followed in wound care center in the past.  If patient's follow up appointment not within next week or so would recommend HHRN to be set up to assist with continued assessment of LE wounds and assistance with dressing changes as needed.  Patient reports family changes dressings for her in the home.    Orders updated in the computer. Discussed POC  bedside nurse.  Re consult if needed, will not follow at this time. Thanks  Lynsay Fesperman [employer] RN, CWOCN [phone])

## 2016-02-04 NOTE — ED Notes (Signed)
Verbalized understanding discharge instructions and follow-up. In no acute distress.  Pt given education and additional wound care supplies.

## 2016-02-04 NOTE — ED Notes (Signed)
Pt with extreme amt of swelling in bil lags, weeping on post Rt leg in multiple places.

## 2016-02-04 NOTE — ED Provider Notes (Signed)
CSN: [id number]     Arrival date & time 02/04/16  0732 History   First MD Initiated Contact with Patient 02/04/16 0745     Chief Complaint  Patient presents with  . Leg Swelling    HPI  Patient presents with concern of new lesions in the right leg. Patient acknowledges a history of chronic bilateral lower extremity lymphedema, with frequent care provided by the wound care center. However, she has not seen those practitioners in about one month. Over the past day she has noticed eruption of multiple erythematous, purulent lesions in the right lateral leg. There is associated generalized pain in the area, but no new systemic pain, no dyspnea, no fever, nausea, vomiting, chills. Minimal relief with Tylenol.   Past Medical History  Diagnosis Date  . Hypertension   . Morbid obesity (HCC)   . Lymphedema of both lower extremities    History reviewed. No pertinent past surgical history. Family History  Problem Relation Age of Onset  . Renal Disease Mother    Social History  Substance Use Topics  . Smoking status: Never Smoker   . Smokeless tobacco: None  . Alcohol Use: No   OB History    Gravida Para Term Preterm AB TAB SAB Ectopic Multiple Living   0              Review of Systems  Constitutional:       Per HPI, otherwise negative  HENT:       Per HPI, otherwise negative  Respiratory:       Per HPI, otherwise negative  Cardiovascular:       Per HPI, otherwise negative  Gastrointestinal: Negative for vomiting.  Endocrine:       Negative aside from HPI  Genitourinary:       Neg aside from HPI   Musculoskeletal:       Per HPI, otherwise negative  Skin: Positive for color change and wound.  Allergic/Immunologic: Negative for immunocompromised state.       Lymphedema  Neurological: Negative for syncope and weakness.      Allergies  Review of patient's allergies indicates no known allergies.  Home Medications   Prior to Admission medications   Medication Sig  Start Date End Date Taking? Authorizing Provider  cephALEXin (KEFLEX) 500 MG capsule Take 1 capsule (500 mg total) by mouth every 6 (six) hours. 09/07/15   Belkys A Regalado, MD  hydrochlorothiazide (HYDRODIURIL) 25 MG tablet Take 1 tablet (25 mg total) by mouth daily. Patient not taking: Reported on 04/25/2015 02/28/12 02/27/13  Cherrie Distance, PA-C  HYDROcodone-acetaminophen (NORCO/VICODIN) 5-325 MG tablet Take 2 tablets by mouth every 4 (four) hours as needed. 12/13/15   Danelle Berry, PA-C  ibuprofen (ADVIL,MOTRIN) 200 MG tablet Take 800 mg by mouth every 6 (six) hours as needed for moderate pain.    Historical Provider, MD   BP 137/98 mmHg  Pulse 87  Temp(Src) 97.6 F (36.4 C) (Oral)  Resp 18  SpO2 91% Physical Exam  Constitutional: She is oriented to person, place, and time. She appears well-developed and well-nourished. No distress.  Morbidly obese young F, awake, alert, appropriately interactive.  HENT:  Head: Normocephalic and atraumatic.  Eyes: Conjunctivae and EOM are normal.  Cardiovascular: Normal rate and regular rhythm.   Pulmonary/Chest: Effort normal. No stridor. No respiratory distress.  Abdominal: She exhibits no distension.  Musculoskeletal: She exhibits no edema.  Profound lower extremity lymphedema. Left leg unchanged according to patient. Right lower leg slightly edematous, with  multiple excoriated lesions, in a pattern consistent with folliculitis, mild confluent erythema on the lateral aspect.  Neurological: She is alert and oriented to person, place, and time. No cranial nerve deficit.  Patient has intact distal NV status in both LE  Skin: Skin is warm.  Wounds as above - w sclerosis throughout both LE.  Psychiatric: She has a normal mood and affect.  Nursing note and vitals reviewed.   ED Course  Procedures (including critical care time)  After my initial evaluation we discussed her condition with nursing and wound care for appropriate cleaning and  dressing.   MDM  Young female with morbid obesity, lymphedema presents with new lesions on her right lower leg. Patient is awake, alert, with no evidence for bacteremia, sepsis, systemic illness. Patient does have evidence for folliculitis, with mild surrounding cellulitis, no circumferential findings. Patient was started on antibiotics, had wound care provided here, was discharged with wound check instructions, return precautions.  Gerhard Munchobert Batsheva Stevick, MD 02/04/16 [phone]

## 2016-02-04 NOTE — Discharge Instructions (Signed)
As discussed, it is important that you monitor your condition carefully, take all medication as directed, and be sure to have your wound evaluated in 3 days.  If you develop new, or concerning changes in your condition, please be sure to return here immediately for additional evaluation.   Folliculitis Folliculitis is redness, soreness, and swelling (inflammation) of the hair follicles. This condition can occur anywhere on the body. People with weakened immune systems, diabetes, or obesity have a greater risk of getting folliculitis. CAUSES  Bacterial infection. This is the most common cause.  Fungal infection.  Viral infection.  Contact with certain chemicals, especially oils and tars. Long-term folliculitis can result from bacteria that live in the nostrils. The bacteria may trigger multiple outbreaks of folliculitis over time. SYMPTOMS Folliculitis most commonly occurs on the scalp, thighs, legs, back, buttocks, and areas where hair is shaved frequently. An early sign of folliculitis is a small, white or yellow, pus-filled, itchy lesion (pustule). These lesions appear on a red, inflamed follicle. They are usually less than 0.2 inches (5 mm) wide. When there is an infection of the follicle that goes deeper, it becomes a boil or furuncle. A group of closely packed boils creates a larger lesion (carbuncle). Carbuncles tend to occur in hairy, sweaty areas of the body. DIAGNOSIS  Your caregiver can usually tell what is wrong by doing a physical exam. A sample may be taken from one of the lesions and tested in a lab. This can help determine what is causing your folliculitis. TREATMENT  Treatment may include:  Applying warm compresses to the affected areas.  Taking antibiotic medicines orally or applying them to the skin.  Draining the lesions if they contain a large amount of pus or fluid.  Laser hair removal for cases of long-lasting folliculitis. This helps to prevent regrowth of the  hair. HOME CARE INSTRUCTIONS  Apply warm compresses to the affected areas as directed by your caregiver.  If antibiotics are prescribed, take them as directed. Finish them even if you start to feel better.  You may take over-the-counter medicines to relieve itching.  Do not shave irritated skin.  Follow up with your caregiver as directed. SEEK IMMEDIATE MEDICAL CARE IF:   You have increasing redness, swelling, or pain in the affected area.  You have a fever. MAKE SURE YOU:  Understand these instructions.  Will watch your condition.  Will get help right away if you are not doing well or get worse.   This information is not intended to replace advice given to you by your health care provider. Make sure you discuss any questions you have with your health care provider.   Document Released: 01/15/2002 Document Revised: 11/27/2014 Document Reviewed: 02/06/2012 Elsevier Interactive Patient Education [employer]2016 Elsevier Inc.

## 2016-02-07 ENCOUNTER — Encounter (HOSPITAL_COMMUNITY): Payer: Self-pay | Admitting: Emergency Medicine

## 2016-02-07 ENCOUNTER — Emergency Department (HOSPITAL_COMMUNITY)
Admission: EM | Admit: 2016-02-07 | Discharge: 2016-02-07 | Disposition: A | Discharge status: Home / Self Care | Payer: BLUE CROSS/BLUE SHIELD | Attending: Emergency Medicine | Admitting: Emergency Medicine

## 2016-02-07 DIAGNOSIS — Z792 Long term (current) use of antibiotics: Secondary | ICD-10-CM | POA: Diagnosis not present

## 2016-02-07 DIAGNOSIS — R000 Tachycardia, unspecified: Secondary | ICD-10-CM | POA: Diagnosis not present

## 2016-02-07 DIAGNOSIS — R600 Localized edema: Secondary | ICD-10-CM | POA: Insufficient documentation

## 2016-02-07 DIAGNOSIS — Z4800 Encounter for change or removal of nonsurgical wound dressing: Secondary | ICD-10-CM | POA: Diagnosis not present

## 2016-02-07 DIAGNOSIS — I10 Essential (primary) hypertension: Secondary | ICD-10-CM | POA: Diagnosis not present

## 2016-02-07 DIAGNOSIS — I891 Lymphangitis: Secondary | ICD-10-CM | POA: Insufficient documentation

## 2016-02-07 DIAGNOSIS — I890 Lymphedema, not elsewhere classified: Secondary | ICD-10-CM | POA: Insufficient documentation

## 2016-02-07 DIAGNOSIS — Z5189 Encounter for other specified aftercare: Secondary | ICD-10-CM

## 2016-02-07 MED ORDER — HYDROCODONE-ACETAMINOPHEN 5-325 MG PO TABS: ORAL_TABLET | ORAL | Status: DC

## 2016-02-07 NOTE — ED Provider Notes (Signed)
CSN: [id number]     Arrival date & time 02/07/16  0908 History   First MD Initiated Contact with Patient 02/07/16 [phone]     Chief Complaint  Patient presents with  . Wound Check     (Consider location/radiation/quality/duration/timing/severity/associated sxs/prior Treatment) HPI   Blood pressure 133/102, pulse 103, temperature 98.1 F (36.7 C), temperature source Oral, resp. rate 16, SpO2 93 %.  Marlaine Hindrica R Bielinski is a 35 y.o. female past medical history significant for morbid obesity, bilateral lower extremity lymphedema and hypertension presenting for wound check. Patient states that she had wound is to right leg onset approximately 1 week ago. She was seen 3 days ago started on Bactrim. She has an appointment at the wound care center on April 2. Patient has not changed the dressing. She denies fevers, chills, nausea, vomiting, increasing pain from site. She rates her pain at 6 out of 10, requesting pain medication to go home with.  Past Medical History  Diagnosis Date  . Hypertension   . Morbid obesity (HCC)   . Lymphedema of both lower extremities    History reviewed. No pertinent past surgical history. Family History  Problem Relation Age of Onset  . Renal Disease Mother    Social History  Substance Use Topics  . Smoking status: Never Smoker   . Smokeless tobacco: None  . Alcohol Use: No   OB History    Gravida Para Term Preterm AB TAB SAB Ectopic Multiple Living   0              Review of Systems  10 systems reviewed and found to be negative, except as noted in the HPI.   Allergies  Review of patient's allergies indicates no known allergies.  Home Medications   Prior to Admission medications   Medication Sig Start Date End Date Taking? Authorizing Provider  cephALEXin (KEFLEX) 500 MG capsule Take 1 capsule (500 mg total) by mouth every 6 (six) hours. 09/07/15   Belkys A Regalado, MD  hydrochlorothiazide (HYDRODIURIL) 25 MG tablet Take 1 tablet (25 mg total) by  mouth daily. Patient not taking: Reported on 04/25/2015 02/28/12 02/27/13  Cherrie DistanceFrances Sanford, PA-C  HYDROcodone-acetaminophen (NORCO/VICODIN) 5-325 MG tablet Take 2 tablets by mouth every 4 (four) hours as needed. 12/13/15   Danelle BerryLeisa Tapia, PA-C  ibuprofen (ADVIL,MOTRIN) 200 MG tablet Take 800 mg by mouth every 6 (six) hours as needed for moderate pain.    Historical Provider, MD  sulfamethoxazole-trimethoprim (BACTRIM DS,SEPTRA DS) 800-160 MG tablet Take 1 tablet by mouth 2 (two) times daily. 02/04/16   Gerhard Munchobert Lockwood, MD   BP 133/102 mmHg  Pulse 103  Temp(Src) 98.1 F (36.7 C) (Oral)  Resp 16  SpO2 93% Physical Exam  Constitutional: She is oriented to person, place, and time. She appears well-developed and well-nourished. No distress.  Obese  HENT:  Head: Normocephalic.  Eyes: Conjunctivae and EOM are normal.  Cardiovascular:  Mild tachycardia  Pulmonary/Chest: Effort normal. No stridor.  Musculoskeletal: Normal range of motion. She exhibits edema and tenderness.  Patient with multiple ulcerations to lateral right lower extremity as pictured, no significant surrounding cellulitis.  Bilateral lower extremity lymphedema.  Neurological: She is alert and oriented to person, place, and time.  Psychiatric: She has a normal mood and affect.  Nursing note and vitals reviewed.     ED Course  Procedures (including critical care time) Labs Review Labs Reviewed - No data to display  Imaging Review No results found. I have personally reviewed and evaluated  these images and lab results as part of my medical decision-making.   EKG Interpretation None      MDM   Final diagnoses:  Visit for wound check    Filed Vitals:   02/07/16 0917  BP: 133/102  Pulse: 103  Temp: 98.1 F (36.7 C)  TempSrc: Oral  Resp: 16  SpO2: 93%    LASHAUN POCH is 35 y.o. female presenting For wound check to right lower extremity ulcerations. Patient was treated for folliculitis with Bactrim. Wound is  dressed in Xeroform and gauze. As per patient wound is improving. Wound will be cleaned and redressed. Patient is given Xeroform, gauze and [place] answered she can change the dressing at home. Patient with no signs of systemic infection, will follow with wound care in early April, extensive discussion of return precautions and patient verbalized her understanding.  Evaluation does not show pathology that would require ongoing emergent intervention or inpatient treatment. Pt is hemodynamically stable and mentating appropriately. Discussed findings and plan with patient/guardian, who agrees with care plan. All questions answered. Return precautions discussed and outpatient follow up given.   New Prescriptions   HYDROCODONE-ACETAMINOPHEN (NORCO/VICODIN) 5-325 MG TABLET    Take 1-2 tablets by mouth every 6 hours as needed for pain and/or cough.         Wynetta Emery, PA-C 02/07/16 1914  Benjiman Core, MD 02/07/16 [phone]

## 2016-02-07 NOTE — Discharge Instructions (Signed)
Wash the affected area with soap and water and apply a thin layer of topical antibiotic ointment   Take your antibiotics as directed and to completion. You should never have any leftover antibiotics! Push fluids and stay well hydrated.   Any antibiotic use can reduce the efficacy of hormonal birth control. Please use back up method of contraception.   If you see signs of worsening infection (warmth, redness, tenderness, pus, sharp increase in pain, fever, red streaking) immediately return to the emergency department.

## 2016-02-07 NOTE — ED Notes (Signed)
Pt was seen on Friday for a wound on her leg. Pt was told to come back in 3 days if she couldn't get to the wound care center in order to redress and assess her wound. Pt has appointment for wound care center on April 2nd. A&Ox4 and ambulatory. Pt denies any complications with wound except continued pain. Pt also c/o N/V with antibiotic.

## 2016-02-21 ENCOUNTER — Encounter (HOSPITAL_BASED_OUTPATIENT_CLINIC_OR_DEPARTMENT_OTHER): Payer: BLUE CROSS/BLUE SHIELD | Attending: Internal Medicine

## 2016-02-21 DIAGNOSIS — Z6841 Body Mass Index (BMI) 40.0 and over, adult: Secondary | ICD-10-CM | POA: Insufficient documentation

## 2016-02-21 DIAGNOSIS — L97811 Non-pressure chronic ulcer of other part of right lower leg limited to breakdown of skin: Secondary | ICD-10-CM | POA: Diagnosis not present

## 2016-02-21 DIAGNOSIS — L97211 Non-pressure chronic ulcer of right calf limited to breakdown of skin: Secondary | ICD-10-CM | POA: Insufficient documentation

## 2016-02-21 DIAGNOSIS — I890 Lymphedema, not elsewhere classified: Secondary | ICD-10-CM | POA: Insufficient documentation

## 2016-02-21 DIAGNOSIS — I87331 Chronic venous hypertension (idiopathic) with ulcer and inflammation of right lower extremity: Secondary | ICD-10-CM | POA: Diagnosis present

## 2016-02-28 DIAGNOSIS — I87331 Chronic venous hypertension (idiopathic) with ulcer and inflammation of right lower extremity: Secondary | ICD-10-CM | POA: Diagnosis not present

## 2016-03-06 DIAGNOSIS — I87331 Chronic venous hypertension (idiopathic) with ulcer and inflammation of right lower extremity: Secondary | ICD-10-CM | POA: Diagnosis not present

## 2016-03-13 DIAGNOSIS — I87331 Chronic venous hypertension (idiopathic) with ulcer and inflammation of right lower extremity: Secondary | ICD-10-CM | POA: Diagnosis not present

## 2016-03-20 ENCOUNTER — Encounter (HOSPITAL_BASED_OUTPATIENT_CLINIC_OR_DEPARTMENT_OTHER): Payer: BLUE CROSS/BLUE SHIELD | Attending: Internal Medicine

## 2016-03-20 DIAGNOSIS — L97811 Non-pressure chronic ulcer of other part of right lower leg limited to breakdown of skin: Secondary | ICD-10-CM | POA: Insufficient documentation

## 2016-03-20 DIAGNOSIS — I890 Lymphedema, not elsewhere classified: Secondary | ICD-10-CM | POA: Insufficient documentation

## 2016-03-20 DIAGNOSIS — I87331 Chronic venous hypertension (idiopathic) with ulcer and inflammation of right lower extremity: Secondary | ICD-10-CM | POA: Insufficient documentation

## 2016-03-20 DIAGNOSIS — L97211 Non-pressure chronic ulcer of right calf limited to breakdown of skin: Secondary | ICD-10-CM | POA: Insufficient documentation

## 2016-03-27 DIAGNOSIS — I87331 Chronic venous hypertension (idiopathic) with ulcer and inflammation of right lower extremity: Secondary | ICD-10-CM | POA: Diagnosis not present

## 2016-04-03 DIAGNOSIS — I87331 Chronic venous hypertension (idiopathic) with ulcer and inflammation of right lower extremity: Secondary | ICD-10-CM | POA: Diagnosis not present

## 2016-04-10 DIAGNOSIS — I87331 Chronic venous hypertension (idiopathic) with ulcer and inflammation of right lower extremity: Secondary | ICD-10-CM | POA: Diagnosis not present

## 2016-04-20 ENCOUNTER — Encounter (HOSPITAL_BASED_OUTPATIENT_CLINIC_OR_DEPARTMENT_OTHER): Payer: BLUE CROSS/BLUE SHIELD | Attending: Internal Medicine

## 2016-04-20 DIAGNOSIS — L97821 Non-pressure chronic ulcer of other part of left lower leg limited to breakdown of skin: Secondary | ICD-10-CM | POA: Diagnosis not present

## 2016-04-20 DIAGNOSIS — I890 Lymphedema, not elsewhere classified: Secondary | ICD-10-CM | POA: Insufficient documentation

## 2016-04-20 DIAGNOSIS — Z6841 Body Mass Index (BMI) 40.0 and over, adult: Secondary | ICD-10-CM | POA: Insufficient documentation

## 2016-04-20 DIAGNOSIS — I87331 Chronic venous hypertension (idiopathic) with ulcer and inflammation of right lower extremity: Secondary | ICD-10-CM | POA: Insufficient documentation

## 2016-04-20 DIAGNOSIS — L97811 Non-pressure chronic ulcer of other part of right lower leg limited to breakdown of skin: Secondary | ICD-10-CM | POA: Diagnosis not present

## 2016-04-27 DIAGNOSIS — I87331 Chronic venous hypertension (idiopathic) with ulcer and inflammation of right lower extremity: Secondary | ICD-10-CM | POA: Diagnosis not present

## 2016-05-04 DIAGNOSIS — I87331 Chronic venous hypertension (idiopathic) with ulcer and inflammation of right lower extremity: Secondary | ICD-10-CM | POA: Diagnosis not present

## 2016-05-11 DIAGNOSIS — I87331 Chronic venous hypertension (idiopathic) with ulcer and inflammation of right lower extremity: Secondary | ICD-10-CM | POA: Diagnosis not present

## 2016-05-18 DIAGNOSIS — I87331 Chronic venous hypertension (idiopathic) with ulcer and inflammation of right lower extremity: Secondary | ICD-10-CM | POA: Diagnosis not present

## 2016-05-25 ENCOUNTER — Encounter (HOSPITAL_BASED_OUTPATIENT_CLINIC_OR_DEPARTMENT_OTHER): Payer: BLUE CROSS/BLUE SHIELD | Attending: Internal Medicine

## 2016-05-25 DIAGNOSIS — L97811 Non-pressure chronic ulcer of other part of right lower leg limited to breakdown of skin: Secondary | ICD-10-CM | POA: Diagnosis not present

## 2016-05-25 DIAGNOSIS — Z6841 Body Mass Index (BMI) 40.0 and over, adult: Secondary | ICD-10-CM | POA: Insufficient documentation

## 2016-05-25 DIAGNOSIS — I87331 Chronic venous hypertension (idiopathic) with ulcer and inflammation of right lower extremity: Secondary | ICD-10-CM | POA: Insufficient documentation

## 2016-07-17 ENCOUNTER — Encounter (HOSPITAL_COMMUNITY): Payer: Self-pay | Admitting: Emergency Medicine

## 2016-07-17 ENCOUNTER — Emergency Department (HOSPITAL_COMMUNITY)
Admission: EM | Admit: 2016-07-17 | Discharge: 2016-07-17 | Disposition: A | Discharge status: Home / Self Care | Payer: BLUE CROSS/BLUE SHIELD | Attending: Emergency Medicine | Admitting: Emergency Medicine

## 2016-07-17 DIAGNOSIS — Z792 Long term (current) use of antibiotics: Secondary | ICD-10-CM | POA: Diagnosis not present

## 2016-07-17 DIAGNOSIS — L97519 Non-pressure chronic ulcer of other part of right foot with unspecified severity: Secondary | ICD-10-CM | POA: Insufficient documentation

## 2016-07-17 DIAGNOSIS — L97529 Non-pressure chronic ulcer of other part of left foot with unspecified severity: Secondary | ICD-10-CM | POA: Insufficient documentation

## 2016-07-17 DIAGNOSIS — M79604 Pain in right leg: Secondary | ICD-10-CM | POA: Diagnosis present

## 2016-07-17 DIAGNOSIS — I10 Essential (primary) hypertension: Secondary | ICD-10-CM | POA: Diagnosis not present

## 2016-07-17 DIAGNOSIS — Z79899 Other long term (current) drug therapy: Secondary | ICD-10-CM | POA: Diagnosis not present

## 2016-07-17 DIAGNOSIS — I890 Lymphedema, not elsewhere classified: Secondary | ICD-10-CM

## 2016-07-17 NOTE — ED Triage Notes (Signed)
Pt reports a hx of lymphedema. Pt reports that two days ago her legs started leaking fluids and she is concerned about infection. Pt states that both legs are painful, but the right leg is more painful and worse in leakage. Pt reports that the left leg has only two sores and that the right leg has multiple sores all over. Pt stated that this has happened before but has never been this bad. Pt stated that she is due to go to the wound clinic on the 8th of September.

## 2016-07-17 NOTE — ED Provider Notes (Signed)
[facility]-EMERGENCY DEPT Provider Note   CSN: [id number] Arrival date & time: 07/17/16  1357  By signing my name below, I, [name], attest that this documentation has been prepared under the direction and in the presence of Jessica RazorStephen Edison Nicholson, MD. Electronically Signed: Placido SouLogan [name], ED Scribe. 07/17/16. 5:58 PM.   History   Chief Complaint Chief Complaint  Patient presents with  . Open Wound    from lymphedema, drainage on both lower legs, pt concerned about infection  . Leg Pain    HPI HPI Comments: [name] is a 35 y.o. female with a history of lymphedema, morbid obesity and cellulitis who presents to the Emergency Department requesting evaluation of multiple wounds to her bilateral lower legs which began worsening 2 days ago. She states she is followed at the wound center and her next appointment is on 07/28/2016. She reports associated, worsening, moderate, burning pain across the wounds that is worse at night after cleaning the sites as well as mild discharge. Pt states she is cleaning the affected regions and changing the dressings regularly. She has been applying Vaseline to the region and took 800 mg of ibuprofen this morning which provides short term relief. She denies fever, chills or other associated symptoms at this time.   The history is provided by the patient. No language interpreter was used.    Past Medical History:  Diagnosis Date  . Hypertension   . Lymphedema of both lower extremities   . Morbid obesity [facility](HCC)    Patient Active Problem List   Diagnosis Date Noted  . Sepsis (HCC) 09/01/2015  . Chest pain 09/01/2015  . Lymphedema 09/01/2015  . Cellulitis of right lower extremity 09/01/2015    History reviewed. No pertinent surgical history.  OB History    Gravida Para Term Preterm AB Living   0             SAB TAB Ectopic Multiple Live Births                   Home Medications    Prior to Admission medications   Medication Sig Start Date End  Date Taking? Authorizing Provider  cephALEXin (KEFLEX) 500 MG capsule Take 1 capsule (500 mg total) by mouth every 6 (six) hours. 09/07/15   Belkys A Regalado, MD  hydrochlorothiazide (HYDRODIURIL) 25 MG tablet Take 1 tablet (25 mg total) by mouth daily. Patient not taking: Reported on 04/25/2015 02/28/12 02/27/13  Cherrie DistanceFrances Sanford, PA-C  HYDROcodone-acetaminophen (NORCO/VICODIN) 5-325 MG tablet Take 1-2 tablets by mouth every 6 hours as needed for pain and/or cough. 02/07/16   Nicole Pisciotta, PA-C  ibuprofen (ADVIL,MOTRIN) 200 MG tablet Take 800 mg by mouth every 6 (six) hours as needed for moderate pain.    Historical Provider, MD  sulfamethoxazole-trimethoprim (BACTRIM DS,SEPTRA DS) 800-160 MG tablet Take 1 tablet by mouth 2 (two) times daily. 02/04/16   Gerhard Munchobert Lockwood, MD    Family History Family History  Problem Relation Age of Onset  . Renal Disease Mother     Social History Social History  Substance Use Topics  . Smoking status: Never Smoker  . Smokeless tobacco: Never Used  . Alcohol use No     Allergies   Review of patient's allergies indicates no known allergies.   Review of Systems Review of Systems  Constitutional: Negative for chills and fever.  Cardiovascular: Positive for leg swelling.  Musculoskeletal: Positive for myalgias.  Skin: Positive for color change and wound.  All other systems reviewed and  are negative.  Physical Exam Updated Vital Signs BP 139/88 (BP Location: Left Arm)   Pulse 93   Temp 98.4 F (36.9 C) (Oral)   Resp 16   Ht 6\' 1"  (1.854 m)   Wt (!) 518 lb (235 kg)   LMP 07/17/2016 (Exact Date)   SpO2 95%   BMI 68.34 kg/m   Physical Exam  Constitutional: She is oriented to person, place, and time. She appears well-developed.  HENT:  Head: Normocephalic.  Eyes: EOM are normal.  Neck: Normal range of motion.  Pulmonary/Chest: Effort normal.  Abdominal: She exhibits no distension.  Musculoskeletal: Normal range of motion. She exhibits  edema.  Severe lymphedema. Chronic stasis changes. Some superficial ulcerations/excoriations to the lateral and posterior aspect of the lower legs with right being worse than left.   Neurological: She is alert and oriented to person, place, and time.  Psychiatric: She has a normal mood and affect.  Nursing note and vitals reviewed.  ED Treatments / Results  Labs (all labs ordered are listed, but only abnormal results are displayed) Labs Reviewed - No data to display  EKG  EKG Interpretation None       Radiology No results found.  Procedures Procedures  DIAGNOSTIC STUDIES: Oxygen Saturation is 95% on RA, normal by my interpretation.    COORDINATION OF CARE: 5:53 PM Discussed next steps with pt. Pt verbalized understanding and is agreeable with the plan.   Medications Ordered in ED Medications - No data to display   Initial Impression / Assessment and Plan / ED Course  I have reviewed the triage vital signs and the nursing notes.  Pertinent labs & imaging results that were available during my care of the patient were reviewed by me and considered in my medical decision making (see chart for details).  Clinical Course    35yF with severe lymphedema of LE. Some superficial ulcerations but they do not appear to be acutely infected. Additional bandage supplies provided and return precautions discussed.   I personally preformed the services scribed in my presence. The recorded information has been reviewed is accurate. Jessica Razor, MD.  Final Clinical Impressions(s) / ED Diagnoses   Final diagnoses:  Lymphedema of both lower extremities    New Prescriptions New Prescriptions   No medications on file     Jessica Razor, MD 07/29/16 [phone]

## 2016-07-28 ENCOUNTER — Encounter (HOSPITAL_BASED_OUTPATIENT_CLINIC_OR_DEPARTMENT_OTHER): Payer: BLUE CROSS/BLUE SHIELD | Attending: Internal Medicine

## 2016-07-28 DIAGNOSIS — L97221 Non-pressure chronic ulcer of left calf limited to breakdown of skin: Secondary | ICD-10-CM | POA: Insufficient documentation

## 2016-07-28 DIAGNOSIS — L97821 Non-pressure chronic ulcer of other part of left lower leg limited to breakdown of skin: Secondary | ICD-10-CM | POA: Diagnosis not present

## 2016-07-28 DIAGNOSIS — Z6841 Body Mass Index (BMI) 40.0 and over, adult: Secondary | ICD-10-CM | POA: Diagnosis not present

## 2016-07-28 DIAGNOSIS — I890 Lymphedema, not elsewhere classified: Secondary | ICD-10-CM | POA: Insufficient documentation

## 2016-07-28 DIAGNOSIS — I87333 Chronic venous hypertension (idiopathic) with ulcer and inflammation of bilateral lower extremity: Secondary | ICD-10-CM | POA: Insufficient documentation

## 2016-07-28 DIAGNOSIS — L97211 Non-pressure chronic ulcer of right calf limited to breakdown of skin: Secondary | ICD-10-CM | POA: Insufficient documentation

## 2016-07-28 DIAGNOSIS — L97811 Non-pressure chronic ulcer of other part of right lower leg limited to breakdown of skin: Secondary | ICD-10-CM | POA: Diagnosis not present

## 2016-08-04 DIAGNOSIS — I87333 Chronic venous hypertension (idiopathic) with ulcer and inflammation of bilateral lower extremity: Secondary | ICD-10-CM | POA: Diagnosis not present

## 2016-08-11 DIAGNOSIS — I87333 Chronic venous hypertension (idiopathic) with ulcer and inflammation of bilateral lower extremity: Secondary | ICD-10-CM | POA: Diagnosis not present

## 2016-08-18 DIAGNOSIS — I87333 Chronic venous hypertension (idiopathic) with ulcer and inflammation of bilateral lower extremity: Secondary | ICD-10-CM | POA: Diagnosis not present

## 2016-08-25 ENCOUNTER — Encounter (HOSPITAL_BASED_OUTPATIENT_CLINIC_OR_DEPARTMENT_OTHER): Payer: BLUE CROSS/BLUE SHIELD | Attending: Internal Medicine

## 2016-08-25 DIAGNOSIS — I890 Lymphedema, not elsewhere classified: Secondary | ICD-10-CM | POA: Insufficient documentation

## 2016-08-25 DIAGNOSIS — L97211 Non-pressure chronic ulcer of right calf limited to breakdown of skin: Secondary | ICD-10-CM | POA: Insufficient documentation

## 2016-08-25 DIAGNOSIS — Z6841 Body Mass Index (BMI) 40.0 and over, adult: Secondary | ICD-10-CM | POA: Insufficient documentation

## 2016-08-25 DIAGNOSIS — I87323 Chronic venous hypertension (idiopathic) with inflammation of bilateral lower extremity: Secondary | ICD-10-CM | POA: Insufficient documentation

## 2016-08-25 DIAGNOSIS — L97221 Non-pressure chronic ulcer of left calf limited to breakdown of skin: Secondary | ICD-10-CM | POA: Insufficient documentation

## 2016-08-25 DIAGNOSIS — L97511 Non-pressure chronic ulcer of other part of right foot limited to breakdown of skin: Secondary | ICD-10-CM | POA: Insufficient documentation

## 2016-08-25 DIAGNOSIS — L97811 Non-pressure chronic ulcer of other part of right lower leg limited to breakdown of skin: Secondary | ICD-10-CM | POA: Insufficient documentation

## 2016-09-21 ENCOUNTER — Encounter (HOSPITAL_BASED_OUTPATIENT_CLINIC_OR_DEPARTMENT_OTHER): Payer: BLUE CROSS/BLUE SHIELD | Attending: Internal Medicine

## 2016-09-21 DIAGNOSIS — S91311A Laceration without foreign body, right foot, initial encounter: Secondary | ICD-10-CM | POA: Insufficient documentation

## 2016-09-21 DIAGNOSIS — L97811 Non-pressure chronic ulcer of other part of right lower leg limited to breakdown of skin: Secondary | ICD-10-CM | POA: Insufficient documentation

## 2016-09-21 DIAGNOSIS — L97221 Non-pressure chronic ulcer of left calf limited to breakdown of skin: Secondary | ICD-10-CM | POA: Insufficient documentation

## 2016-09-21 DIAGNOSIS — I890 Lymphedema, not elsewhere classified: Secondary | ICD-10-CM | POA: Insufficient documentation

## 2016-09-21 DIAGNOSIS — X58XXXA Exposure to other specified factors, initial encounter: Secondary | ICD-10-CM | POA: Insufficient documentation

## 2016-09-21 DIAGNOSIS — I87323 Chronic venous hypertension (idiopathic) with inflammation of bilateral lower extremity: Secondary | ICD-10-CM | POA: Insufficient documentation

## 2016-09-21 DIAGNOSIS — L97211 Non-pressure chronic ulcer of right calf limited to breakdown of skin: Secondary | ICD-10-CM | POA: Insufficient documentation

## 2016-09-21 DIAGNOSIS — Z6841 Body Mass Index (BMI) 40.0 and over, adult: Secondary | ICD-10-CM | POA: Insufficient documentation

## 2016-09-28 ENCOUNTER — Other Ambulatory Visit: Payer: Self-pay | Admitting: Internal Medicine

## 2016-09-28 DIAGNOSIS — S81801A Unspecified open wound, right lower leg, initial encounter: Secondary | ICD-10-CM

## 2016-10-06 ENCOUNTER — Ambulatory Visit (HOSPITAL_COMMUNITY)
Admission: RE | Admit: 2016-10-06 | Discharge: 2016-10-06 | Disposition: A | Discharge status: Home / Self Care | Payer: BLUE CROSS/BLUE SHIELD | Source: Ambulatory Visit | Attending: Cardiovascular Disease | Admitting: Cardiovascular Disease

## 2016-10-06 DIAGNOSIS — S81801A Unspecified open wound, right lower leg, initial encounter: Secondary | ICD-10-CM | POA: Diagnosis not present

## 2016-10-06 DIAGNOSIS — X58XXXA Exposure to other specified factors, initial encounter: Secondary | ICD-10-CM | POA: Insufficient documentation

## 2016-10-20 ENCOUNTER — Encounter (HOSPITAL_BASED_OUTPATIENT_CLINIC_OR_DEPARTMENT_OTHER): Payer: BLUE CROSS/BLUE SHIELD | Attending: Internal Medicine

## 2016-10-20 DIAGNOSIS — I890 Lymphedema, not elsewhere classified: Secondary | ICD-10-CM | POA: Insufficient documentation

## 2016-10-20 DIAGNOSIS — L97811 Non-pressure chronic ulcer of other part of right lower leg limited to breakdown of skin: Secondary | ICD-10-CM | POA: Insufficient documentation

## 2016-10-20 DIAGNOSIS — Z6841 Body Mass Index (BMI) 40.0 and over, adult: Secondary | ICD-10-CM | POA: Insufficient documentation

## 2016-10-20 DIAGNOSIS — L97812 Non-pressure chronic ulcer of other part of right lower leg with fat layer exposed: Secondary | ICD-10-CM | POA: Insufficient documentation

## 2016-10-20 DIAGNOSIS — I87323 Chronic venous hypertension (idiopathic) with inflammation of bilateral lower extremity: Secondary | ICD-10-CM | POA: Insufficient documentation

## 2016-11-22 ENCOUNTER — Encounter (HOSPITAL_BASED_OUTPATIENT_CLINIC_OR_DEPARTMENT_OTHER): Payer: Self-pay | Attending: Internal Medicine

## 2016-11-22 DIAGNOSIS — I890 Lymphedema, not elsewhere classified: Secondary | ICD-10-CM | POA: Insufficient documentation

## 2016-11-22 DIAGNOSIS — Z6841 Body Mass Index (BMI) 40.0 and over, adult: Secondary | ICD-10-CM | POA: Insufficient documentation

## 2016-11-22 DIAGNOSIS — L97812 Non-pressure chronic ulcer of other part of right lower leg with fat layer exposed: Secondary | ICD-10-CM | POA: Insufficient documentation

## 2016-11-22 DIAGNOSIS — L97221 Non-pressure chronic ulcer of left calf limited to breakdown of skin: Secondary | ICD-10-CM | POA: Insufficient documentation

## 2016-11-22 DIAGNOSIS — L97211 Non-pressure chronic ulcer of right calf limited to breakdown of skin: Secondary | ICD-10-CM | POA: Insufficient documentation

## 2016-11-22 DIAGNOSIS — I87323 Chronic venous hypertension (idiopathic) with inflammation of bilateral lower extremity: Secondary | ICD-10-CM | POA: Insufficient documentation

## 2016-12-21 ENCOUNTER — Encounter (HOSPITAL_BASED_OUTPATIENT_CLINIC_OR_DEPARTMENT_OTHER): Payer: Self-pay | Attending: Internal Medicine

## 2016-12-21 DIAGNOSIS — L97812 Non-pressure chronic ulcer of other part of right lower leg with fat layer exposed: Secondary | ICD-10-CM | POA: Insufficient documentation

## 2016-12-21 DIAGNOSIS — Z6841 Body Mass Index (BMI) 40.0 and over, adult: Secondary | ICD-10-CM | POA: Insufficient documentation

## 2016-12-21 DIAGNOSIS — I87321 Chronic venous hypertension (idiopathic) with inflammation of right lower extremity: Secondary | ICD-10-CM | POA: Insufficient documentation

## 2016-12-21 DIAGNOSIS — I890 Lymphedema, not elsewhere classified: Secondary | ICD-10-CM | POA: Insufficient documentation

## 2017-06-28 ENCOUNTER — Emergency Department (HOSPITAL_COMMUNITY): Payer: No Typology Code available for payment source

## 2017-06-28 ENCOUNTER — Emergency Department (HOSPITAL_COMMUNITY)
Admission: EM | Admit: 2017-06-28 | Discharge: 2017-06-28 | Disposition: A | Discharge status: Home / Self Care | Payer: No Typology Code available for payment source | Attending: Emergency Medicine | Admitting: Emergency Medicine

## 2017-06-28 ENCOUNTER — Encounter (HOSPITAL_COMMUNITY): Payer: Self-pay | Admitting: Emergency Medicine

## 2017-06-28 DIAGNOSIS — S0990XA Unspecified injury of head, initial encounter: Secondary | ICD-10-CM | POA: Diagnosis not present

## 2017-06-28 DIAGNOSIS — Y929 Unspecified place or not applicable: Secondary | ICD-10-CM | POA: Diagnosis not present

## 2017-06-28 DIAGNOSIS — Y999 Unspecified external cause status: Secondary | ICD-10-CM | POA: Insufficient documentation

## 2017-06-28 DIAGNOSIS — E669 Obesity, unspecified: Secondary | ICD-10-CM | POA: Diagnosis not present

## 2017-06-28 DIAGNOSIS — S39012A Strain of muscle, fascia and tendon of lower back, initial encounter: Secondary | ICD-10-CM | POA: Diagnosis not present

## 2017-06-28 DIAGNOSIS — R51 Headache: Secondary | ICD-10-CM | POA: Diagnosis not present

## 2017-06-28 DIAGNOSIS — Z6841 Body Mass Index (BMI) 40.0 and over, adult: Secondary | ICD-10-CM | POA: Diagnosis not present

## 2017-06-28 DIAGNOSIS — Y939 Activity, unspecified: Secondary | ICD-10-CM | POA: Diagnosis not present

## 2017-06-28 LAB — POC URINE PREG, ED: Preg Test, Ur: NEGATIVE

## 2017-06-28 MED ORDER — CYCLOBENZAPRINE HCL 10 MG PO TABS: 10.0000 mg | ORAL_TABLET | Freq: Three times a day (TID) | ORAL | 0 refills | Status: DC | PRN
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
## 2017-06-28 NOTE — Discharge Instructions (Signed)
Ibuprofen 600 mg every 6 hours as needed for pain. Flexeril as needed for pain not relieved with ibuprofen. ° °Follow-up with your primary Dr. if not improving in the next week. °

## 2017-08-30 ENCOUNTER — Encounter (HOSPITAL_COMMUNITY): Payer: Self-pay | Admitting: *Deleted

## 2017-08-30 ENCOUNTER — Inpatient Hospital Stay (HOSPITAL_COMMUNITY)
Admission: EM | Admit: 2017-08-30 | Discharge: 2017-09-02 | DRG: 872 | Disposition: A | Discharge status: Home / Self Care | Payer: Self-pay | Attending: Oncology | Admitting: Oncology

## 2017-08-30 ENCOUNTER — Emergency Department (HOSPITAL_COMMUNITY): Payer: Self-pay

## 2017-08-30 DIAGNOSIS — A419 Sepsis, unspecified organism: Principal | ICD-10-CM | POA: Diagnosis present

## 2017-08-30 DIAGNOSIS — S81802A Unspecified open wound, left lower leg, initial encounter: Secondary | ICD-10-CM

## 2017-08-30 DIAGNOSIS — R0902 Hypoxemia: Secondary | ICD-10-CM | POA: Diagnosis present

## 2017-08-30 DIAGNOSIS — I10 Essential (primary) hypertension: Secondary | ICD-10-CM | POA: Diagnosis present

## 2017-08-30 DIAGNOSIS — I890 Lymphedema, not elsewhere classified: Secondary | ICD-10-CM

## 2017-08-30 DIAGNOSIS — I248 Other forms of acute ischemic heart disease: Secondary | ICD-10-CM | POA: Diagnosis present

## 2017-08-30 DIAGNOSIS — I872 Venous insufficiency (chronic) (peripheral): Secondary | ICD-10-CM | POA: Diagnosis present

## 2017-08-30 DIAGNOSIS — L259 Unspecified contact dermatitis, unspecified cause: Secondary | ICD-10-CM | POA: Diagnosis present

## 2017-08-30 DIAGNOSIS — B029 Zoster without complications: Secondary | ICD-10-CM | POA: Diagnosis present

## 2017-08-30 DIAGNOSIS — R21 Rash and other nonspecific skin eruption: Secondary | ICD-10-CM

## 2017-08-30 DIAGNOSIS — B351 Tinea unguium: Secondary | ICD-10-CM | POA: Diagnosis present

## 2017-08-30 DIAGNOSIS — R0789 Other chest pain: Secondary | ICD-10-CM

## 2017-08-30 DIAGNOSIS — R6520 Severe sepsis without septic shock: Secondary | ICD-10-CM | POA: Diagnosis present

## 2017-08-30 DIAGNOSIS — L97929 Non-pressure chronic ulcer of unspecified part of left lower leg with unspecified severity: Secondary | ICD-10-CM | POA: Diagnosis present

## 2017-08-30 DIAGNOSIS — L03116 Cellulitis of left lower limb: Secondary | ICD-10-CM | POA: Diagnosis present

## 2017-08-30 DIAGNOSIS — E872 Acidosis: Secondary | ICD-10-CM | POA: Diagnosis present

## 2017-08-30 DIAGNOSIS — Z6841 Body Mass Index (BMI) 40.0 and over, adult: Secondary | ICD-10-CM

## 2017-08-30 LAB — LACTIC ACID, PLASMA
Lactic Acid, Venous: 1.7 mmol/L (ref 0.5–1.9)
Lactic Acid, Venous: 1.8 mmol/L (ref 0.5–1.9)

## 2017-08-30 LAB — I-STAT CG4 LACTIC ACID, ED
Lactic Acid, Venous: 4.01 mmol/L (ref 0.5–1.9)
Lactic Acid, Venous: 2.17 mmol/L (ref 0.5–1.9)

## 2017-08-30 LAB — CBC WITH DIFFERENTIAL/PLATELET
Basophils Absolute: 0 10*3/uL (ref 0.0–0.1)
Basophils Relative: 0 %
Eosinophils Absolute: 0 10*3/uL (ref 0.0–0.7)
Eosinophils Relative: 0 %
HCT: 45 % (ref 36.0–46.0)
Hemoglobin: 14.5 g/dL (ref 12.0–15.0)
Lymphocytes Relative: 3 %
Lymphs Abs: 0.8 10*3/uL (ref 0.7–4.0)
MCH: 31.7 pg (ref 26.0–34.0)
MCHC: 32.2 g/dL (ref 30.0–36.0)
MCV: 98.3 fL (ref 78.0–100.0)
Monocytes Absolute: 0.6 10*3/uL (ref 0.1–1.0)
Monocytes Relative: 3 %
Neutro Abs: 21 10*3/uL — ABNORMAL HIGH (ref 1.7–7.7)
Neutrophils Relative %: 94 %
Platelets: 267 10*3/uL (ref 150–400)
RBC: 4.58 MIL/uL (ref 3.87–5.11)
RDW: 13.3 % (ref 11.5–15.5)
WBC: 22.5 10*3/uL — ABNORMAL HIGH (ref 4.0–10.5)

## 2017-08-30 LAB — COMPREHENSIVE METABOLIC PANEL
ALT: 17 U/L (ref 14–54)
AST: 25 U/L (ref 15–41)
Albumin: 4.1 g/dL (ref 3.5–5.0)
Alkaline Phosphatase: 63 U/L (ref 38–126)
Anion gap: 10 (ref 5–15)
BUN: 8 mg/dL (ref 6–20)
CO2: 27 mmol/L (ref 22–32)
Calcium: 9.5 mg/dL (ref 8.9–10.3)
Chloride: 103 mmol/L (ref 101–111)
Creatinine, Ser: 0.71 mg/dL (ref 0.44–1.00)
GFR calc Af Amer: >60 mL/min (ref >60)
GFR calc non Af Amer: >60 mL/min (ref >60)
Glucose, Bld: 87 mg/dL (ref 65–99)
Potassium: 4.1 mmol/L (ref 3.5–5.1)
Sodium: 140 mmol/L (ref 135–145)
Total Bilirubin: 1.2 mg/dL (ref 0.3–1.2)
Total Protein: 8.5 g/dL — ABNORMAL HIGH (ref 6.5–8.1)

## 2017-08-30 LAB — I-STAT TROPONIN, ED: Troponin i, poc: 0 ng/mL (ref 0.00–0.08)

## 2017-08-30 LAB — TROPONIN I
Troponin I: <0.03 ng/mL (ref <0.03)
Troponin I: 0.07 ng/mL (ref <0.03)

## 2017-08-30 LAB — I-STAT BETA HCG BLOOD, ED (MC, WL, AP ONLY): I-stat hCG, quantitative: <5 m[IU]/mL (ref <5)

## 2017-08-30 LAB — MRSA PCR SCREENING: MRSA by PCR: NEGATIVE

## 2017-08-30 MED ORDER — VANCOMYCIN HCL 10 G IV SOLR
1500.0000 mg | Freq: Three times a day (TID) | INTRAVENOUS | Status: DC
  Administered 2017-08-30 – 2017-09-01 (×6): 1500 mg via INTRAVENOUS
  Filled 2017-08-30 (×7): qty 1500

## 2017-08-30 MED ORDER — ACETAMINOPHEN 325 MG PO TABS
650.0000 mg | ORAL_TABLET | Freq: Four times a day (QID) | ORAL | Status: DC | PRN
  Administered 2017-08-31 – 2017-09-01 (×2): 650 mg via ORAL
  Filled 2017-08-30 (×2): qty 2

## 2017-08-30 MED ORDER — PIPERACILLIN-TAZOBACTAM 3.375 G IVPB 30 MIN
3.3750 g | Freq: Once | INTRAVENOUS | Status: AC
  Administered 2017-08-30: 3.375 g via INTRAVENOUS
  Filled 2017-08-30: qty 50

## 2017-08-30 MED ORDER — LACTATED RINGERS IV BOLUS (SEPSIS)
1000.0000 mL | Freq: Once | INTRAVENOUS | Status: AC
  Administered 2017-08-30: 1000 mL via INTRAVENOUS

## 2017-08-30 MED ORDER — ENSURE ENLIVE PO LIQD
237.0000 mL | Freq: Two times a day (BID) | ORAL | Status: DC
  Administered 2017-08-31: 237 mL via ORAL

## 2017-08-30 MED ORDER — IBUPROFEN 400 MG PO TABS
600.0000 mg | ORAL_TABLET | Freq: Once | ORAL | Status: AC
  Administered 2017-08-30: 600 mg via ORAL
  Filled 2017-08-30: qty 1

## 2017-08-30 MED ORDER — ACETAMINOPHEN 650 MG RE SUPP: 650.0000 mg | Freq: Four times a day (QID) | RECTAL | Status: DC | PRN

## 2017-08-30 MED ORDER — SODIUM CHLORIDE 0.9 % IV BOLUS (SEPSIS)
1000.0000 mL | Freq: Once | INTRAVENOUS | Status: AC
  Administered 2017-08-30: 1000 mL via INTRAVENOUS

## 2017-08-30 MED ORDER — ONDANSETRON HCL 4 MG/2ML IJ SOLN
4.0000 mg | Freq: Once | INTRAMUSCULAR | Status: AC
  Administered 2017-08-30: 4 mg via INTRAVENOUS
  Filled 2017-08-30: qty 2

## 2017-08-30 MED ORDER — ENOXAPARIN SODIUM 40 MG/0.4ML ~~LOC~~ SOLN
40.0000 mg | SUBCUTANEOUS | Status: DC
  Administered 2017-08-30: 40 mg via SUBCUTANEOUS
  Filled 2017-08-30: qty 0.4

## 2017-08-30 MED ORDER — VANCOMYCIN HCL 10 G IV SOLR
2500.0000 mg | Freq: Once | INTRAVENOUS | Status: AC
  Administered 2017-08-30: 2500 mg via INTRAVENOUS
  Filled 2017-08-30: qty 2500

## 2017-08-30 MED ORDER — DIPHENHYDRAMINE HCL 25 MG PO CAPS
25.0000 mg | ORAL_CAPSULE | Freq: Four times a day (QID) | ORAL | Status: DC | PRN
  Administered 2017-08-30 – 2017-08-31 (×2): 25 mg via ORAL
  Filled 2017-08-30 (×2): qty 1

## 2017-08-30 MED ORDER — SODIUM CHLORIDE 0.9 % IV BOLUS (SEPSIS)
1000.0000 mL | Freq: Once | INTRAVENOUS | Status: AC
Start: 2017-08-30 — End: 2017-08-30
  Administered 2017-08-30: 1000 mL via INTRAVENOUS

## 2017-08-30 MED ORDER — PIPERACILLIN-TAZOBACTAM 3.375 G IVPB
3.3750 g | Freq: Three times a day (TID) | INTRAVENOUS | Status: DC
  Administered 2017-08-31 – 2017-09-01 (×5): 3.375 g via INTRAVENOUS
  Filled 2017-08-30 (×6): qty 50

## 2017-08-30 MED ORDER — ACETAMINOPHEN 325 MG PO TABS
650.0000 mg | ORAL_TABLET | Freq: Once | ORAL | Status: AC
  Administered 2017-08-30: 650 mg via ORAL
  Filled 2017-08-30: qty 2

## 2017-08-30 MED ORDER — VANCOMYCIN HCL IN DEXTROSE 1-5 GM/200ML-% IV SOLN: 1000.0000 mg | Freq: Once | INTRAVENOUS | Status: DC

## 2017-08-30 MED ORDER — SODIUM CHLORIDE 0.9 % IV SOLN
INTRAVENOUS | Status: AC
  Administered 2017-08-30: 19:00:00 via INTRAVENOUS

## 2017-08-30 NOTE — ED Triage Notes (Signed)
Pt states woke up at 0630 with chest hurt and chills and left 2 fingers numb.  Pt states the last time she had these symptoms she had a leg infection.  She was being treated for leg wounds.  Pt also works in a daycare. HR 140s at triage.  States intermittent sob

## 2017-08-30 NOTE — ED Notes (Signed)
Pt transported to xray 

## 2017-08-30 NOTE — ED Notes (Signed)
EKG WAS DONE AT 750 INSTEAD OF 850 IN TRIAGE.

## 2017-08-30 NOTE — ED Notes (Signed)
Gave pt water, per Dr. Erma Heritage.

## 2017-08-30 NOTE — ED Provider Notes (Signed)
[facility]-EMERGENCY DEPT Provider Note   CSN: [id number] Arrival date & time: 08/30/17  0741     History   Chief Complaint Chief Complaint  Patient presents with  . Chest Pain  . Shortness of Breath  . Chills    HPI [name] is a 36 y.o. female.  HPI   36 year old female with past medical history of morbid obesity and lymphedema here with generalized body aches and chills. The patient states she woke up today with transient numbness in her right arm. She then began to experience severe chills, fatigue, and shortness of breath. She notes that her right leg became more painful yesterday. She has known chronic wounds but is no longer seeing the wound care center. She is stressing at home. She does note that it is been increasingly painful. Over the last several hours, she's had persistent body aches, chills, and diffuse pain. Her numbness comes and goes then resolves. Denies any associated headache. She's had no known fevers but has not checked her temperature. 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 Denies any cough or sputum production. Denies any abdominal pain, nausea, or vomiting. Denies any recent trauma to her lower extremities.  Past Medical History:  Diagnosis Date  . Hypertension   . Lymphedema of both lower extremities   . Morbid obesity [facility])     Patient Active Problem List   Diagnosis Date Noted  . Sepsis (HCC) 09/01/2015  . Chest pain 09/01/2015  . Lymphedema 09/01/2015  . Cellulitis of right lower extremity 09/01/2015    History reviewed. No pertinent surgical history.  OB History    Gravida Para Term Preterm AB Living   0             SAB TAB Ectopic Multiple Live Births                   Home Medications    Prior to Admission medications   Medication Sig Start Date End Date Taking? Authorizing Provider  aspirin EC 81 MG tablet Take 325 mg by mouth once.   Yes [provider]  ibuprofen (ADVIL,MOTRIN) 200 MG tablet Take 800  mg by mouth every 6 (six) hours as needed for moderate pain.   Yes [provider]  cephALEXin (KEFLEX) 500 MG capsule Take 1 capsule (500 mg total) by mouth every 6 (six) hours. 09/07/15   Regalado, Belkys A, MD  cyclobenzaprine (FLEXERIL) 10 MG tablet Take 1 tablet (10 mg total) by mouth 3 (three) times daily as needed for muscle spasms. 06/28/17   Geoffery Lyons, MD  hydrochlorothiazide (HYDRODIURIL) 25 MG tablet Take 1 tablet (25 mg total) by mouth daily. Patient not taking: Reported on 04/25/2015 02/28/12 02/27/13  Cherrie Distance, PA-C  HYDROcodone-acetaminophen (NORCO/VICODIN) 5-325 MG tablet Take 1-2 tablets by mouth every 6 hours as needed for pain and/or cough. 02/07/16   Pisciotta, Joni Reining, PA-C  sulfamethoxazole-trimethoprim (BACTRIM DS,SEPTRA DS) 800-160 MG tablet Take 1 tablet by mouth 2 (two) times daily. 02/04/16   Gerhard Munch, MD    Family History Family History  Problem Relation Age of Onset  . Renal Disease Mother     Social History Social History  Substance Use Topics  . Smoking status: Never Smoker  . Smokeless tobacco: Never Used  . Alcohol use No     Allergies   Patient has no known allergies.   Review of Systems Review of Systems  Constitutional: Positive for fatigue.  Respiratory: Positive for shortness of  breath.   Cardiovascular: Positive for leg swelling.  Skin: Positive for wound.  All other systems reviewed and are negative.    Physical Exam Updated Vital Signs BP 126/60   Pulse (!) 121   Temp (!) 101.3 F (38.5 C) (Rectal)   Resp (!) 23   SpO2 100%   Physical Exam  Constitutional: She is oriented to person, place, and time. She appears well-developed and well-nourished. No distress.  HENT:  Head: Normocephalic and atraumatic.  Eyes: Conjunctivae are normal.  Neck: Neck supple.  Cardiovascular: Regular rhythm and normal heart sounds.  Tachycardia present.  Exam reveals no friction rub.   No murmur heard. Pulmonary/Chest: Effort  normal and breath sounds normal. Tachypnea noted. No respiratory distress. She has no wheezes. She has no rales.  Abdominal: She exhibits no distension.  Musculoskeletal: She exhibits no edema.  Neurological: She is alert and oriented to person, place, and time. She exhibits normal muscle tone.  Skin: Skin is warm. Capillary refill takes less than 2 seconds.  Psychiatric: She has a normal mood and affect.  Nursing note and vitals reviewed.   LOWER EXTREMITY EXAM: Bilateral  INSPECTION & PALPATION: Bilateral, markedly lymphedema with chronic skin changes. There is significant warmth, induration, and tenderness throughout the right lower extremity distal to the knee. There are superficial, large crater ulcerations on the lateral aspect of the shin with foul-smelling drainage. No crepitance.  SENSORY: sensation is intact to light touch in:  Superficial peroneal nerve distribution (over dorsum of foot) Deep peroneal nerve distribution (over first dorsal web space) Sural nerve distribution (over lateral aspect 5th metatarsal) Saphenous nerve distribution (over medial instep)  MOTOR:  + Motor EHL (great toe dorsiflexion) + FHL (great toe plantar flexion)  + TA (ankle dorsiflexion)  + GSC (ankle plantar flexion)  VASCULAR: 2+ dorsalis pedis and posterior tibialis pulses Capillary refill < 2 sec, toes warm and well-perfused  COMPARTMENTS: Soft, warm, well-perfused No pain with passive extension No parethesias    ED Treatments / Results  Labs (all labs ordered are listed, but only abnormal results are displayed) Labs Reviewed  COMPREHENSIVE METABOLIC PANEL - Abnormal; Notable for the following:       Result Value   Total Protein 8.5 (*)    All other components within normal limits  CBC WITH DIFFERENTIAL/PLATELET - Abnormal; Notable for the following:    WBC 22.5 (*)    Neutro Abs 21.0 (*)    All other components within normal limits  I-STAT CG4 LACTIC ACID, ED - Abnormal;  Notable for the following:    Lactic Acid, Venous 4.01 (*)    All other components within normal limits  I-STAT CG4 LACTIC ACID, ED - Abnormal; Notable for the following:    Lactic Acid, Venous 2.17 (*)    All other components within normal limits  CULTURE, BLOOD (ROUTINE X 2)  CULTURE, BLOOD (ROUTINE X 2)  URINALYSIS, ROUTINE W REFLEX MICROSCOPIC  I-STAT TROPONIN, ED  I-STAT BETA HCG BLOOD, ED ([facility], [facility], [facility] ONLY)    EKG  EKG Interpretation  Date/Time:  Thursday August 30 2017 07:50:50 EDT Ventricular Rate:  137 PR Interval:  122 QRS Duration: 76 QT Interval:  296 QTC Calculation: 446 R Axis:   -11 Text Interpretation:  Sinus tachycardia Inferior infarct , age undetermined Cannot rule out Anterior infarct , age undetermined T wave abnormality, consider lateral ischemia Abnormal ECG No significant change since last tracing Confirmed by Shaune Pollack [phone]) on 08/30/2017 3:23:41 PM  Radiology Dg Chest 2 View  Result Date: 08/30/2017 CLINICAL DATA:  Onset of shortness of breath earlier today. There is also peripheral edema. EXAM: CHEST  2 VIEW COMPARISON:  Chest x-ray of September 01, 2015 FINDINGS: The lungs are well-expanded. There is no focal infiltrate. The cardiac silhouette is top-normal in size. The central pulmonary vascularity is prominent. There is no pleural effusion. IMPRESSION: Top-normal cardiac size. No significant pulmonary vascular congestion, pulmonary edema, nor other acute cardiopulmonary abnormality. Electronically Signed   By: David  [place] M.D.   On: 08/30/2017 08:56   Dg Tibia/fibula Right  Result Date: 08/30/2017 CLINICAL DATA:  Swelling distal lat rt tibfib has an open wound For 3 weeks, r/o osteo EXAM: RIGHT TIBIA AND FIBULA - 2 VIEW COMPARISON:  None. FINDINGS: Soft tissue defect overlying the distal right fibula, compatible with the given history of open wound. No soft tissue gas within the deeper aspects or more superior aspects of the right lower  extremity. Osseous structures appear intact and normal in mineralization. No destructive change or focal demineralization to suggest osteomyelitis. IMPRESSION: 1. No evidence of osteomyelitis. 2. Soft tissue defect within the superficial soft tissues overlying the fibula, compatible with the given history of open wound. No evidence of extension to the deeper soft tissues. No soft tissue gas within the deeper or more proximal soft tissues. Electronically Signed   By: Bary Richard M.D.   On: 08/30/2017 09:59    Procedures .Critical Care Performed by: Shaune Pollack Authorized by: Shaune Pollack   Critical care provider statement:    Critical care time (minutes):  35   Critical care time was exclusive of:  Separately billable procedures and treating other patients and teaching time   Critical care was necessary to treat or prevent imminent or life-threatening deterioration of the following conditions:  Sepsis, circulatory failure and dehydration   Critical care was time spent personally by me on the following activities:  Development of treatment plan with patient or surrogate, discussions with consultants, evaluation of patient's response to treatment, examination of patient, obtaining history from patient or surrogate, ordering and performing treatments and interventions, ordering and review of laboratory studies, ordering and review of radiographic studies, pulse oximetry, re-evaluation of patient's condition and review of old charts   I assumed direction of critical care for this patient from another provider in my specialty: no     (including critical care time)  Medications Ordered in ED Medications  ibuprofen (ADVIL,MOTRIN) tablet 600 mg (not administered)  vancomycin (VANCOCIN) 1,500 mg in sodium chloride 0.9 % 500 mL IVPB (not administered)  piperacillin-tazobactam (ZOSYN) IVPB 3.375 g (not administered)  sodium chloride 0.9 % bolus 1,000 mL (0 mLs Intravenous Stopped 08/30/17 1154)    sodium chloride 0.9 % bolus 1,000 mL (0 mLs Intravenous Stopped 08/30/17 1309)  piperacillin-tazobactam (ZOSYN) IVPB 3.375 g (0 g Intravenous Stopped 08/30/17 1105)  acetaminophen (TYLENOL) tablet 650 mg (650 mg Oral Given 08/30/17 0953)  vancomycin (VANCOCIN) 2,500 mg in sodium chloride 0.9 % 500 mL IVPB (0 mg Intravenous Stopped 08/30/17 1400)  ondansetron (ZOFRAN) injection 4 mg (4 mg Intravenous Given 08/30/17 1014)  lactated ringers bolus 1,000 mL (0 mLs Intravenous Stopped 08/30/17 1401)     Initial Impression / Assessment and Plan / ED Course  I have reviewed the triage vital signs and the nursing notes.  Pertinent labs & imaging results that were available during my care of the patient were reviewed by me and considered in my medical decision making (see chart  for details).     36 year old female with history of chronic lymphedema and morbid obesity here with fever, tachycardia, and hypotension setting of likely severe sepsis secondary to right leg infection. No free air, no hyponatremia to suggest necrotizing infection at this time, but will need to be monitored closely. Patient is otherwise hemodynamically improved with fluids and pain control. She's been given broad-spectrum antibiotics. Will likely need admission to stepdown unit. Lactic acid 4, will repeat after fluids.  LA improving. She remains tachycardic, however. Mental status remains stable, peripheral perfusion improving and she is making urine. Continue fluids, admission. Of note, 30 cc/kg not given at once due to weight of 233 kg. Will continue appropriate fluids and assess via UOP/lab markers/signs of perfusion.  Final Clinical Impressions(s) / ED Diagnoses   Final diagnoses:  Severe sepsis (HCC)  Left leg cellulitis    New Prescriptions New Prescriptions   No medications on file     Shaune Pollack, MD 08/30/17 1524

## 2017-08-30 NOTE — ED Notes (Signed)
Purewick (external female catheter) placed on pt.

## 2017-08-30 NOTE — ED Notes (Signed)
Got patient undress on the monitor patient is restin with call bell in reach

## 2017-08-30 NOTE — ED Notes (Signed)
[place] RN notified @ [phone] that Code Sepsis was paged out.

## 2017-08-30 NOTE — ED Notes (Signed)
Patient transported to X-ray 

## 2017-08-30 NOTE — H&P (Signed)
Date: 08/30/2017               Patient Name:  [name] MRN: [id number]  DOB: January 23, 1981 Age / Sex: 36 y.o., female   PCP: Patient, No Pcp Per         Medical Service: Internal Medicine Teaching Service         Attending Physician: Dr. Levert Feinstein, MD    First Contact: Dr. Minda Meo Pager: [phone]  Second Contact: Dr. Mikey Bussing Pager: [phone]       After Hours (After 5p/  First Contact Pager: [phone]  weekends / holidays): Second Contact Pager: [phone]   Chief Complaint: chest pain and chills   History of Present Illness: Patient is a 36 year old female with a past medical history of chronic lymphedema of bilateral lower extremities, hypertension, and morbid obesity presenting to the hospital with a chief complaint of chest pain and chills. Patient states she experienced chest pain and chills while getting ready for work around 6:30 AM this morning. She describes the chest pain as left-sided, 9 out of 10 in intensity, and pressure like. Chest pain radiated to the back. It was associated with shortness of breath. Symptoms resolved in 3.5 hrs. Denies having any associated nausea or diaphoresis. Denies any prior episodes of chest pain. States she was feeling well until this morning. Also reports waking up with numbness in her fourth and fifth digits which she attributes to sleeping the wrong way. The numbness resolved in 2 hours. Denies having any weakness in her extremities, dysarthria, or facial droop. Patient reports having bilateral lower extremity lymphedema with intermittent open wounds for the past 3 years. She does not have a primary care physician and stopped going to wound care back in January. She is currently managing the wounds on her own by cleaning and doing dressing changes 3 times a day at home. States the wounds on her right leg were burning more than usual yesterday and she noticed some yellow drainage from them. Patient has no other complaints. She denies having  any dysuria, frequency, or urgency. No other respiratory complaints.  Patient presented febrile (temperature 101.3), tachycardic (heart rate in 120s), and tachypnic (respiratory rate in 20s). Lowest blood pressure in the ED 90/58 per ED physician. O2 sats from 95-98% on room air. Lactate 4.0. White count 22.5. Chest x-ray negative. X-ray of right tibia/fibula without evidence of osteomyelitis or deep soft tissue infection. X-ray showing defect in the superficial soft tissues overlying the fibula. Initial troponin negative. EKG showing sinus tachycardia but not suggestive of any acute ischemic changes.  Meds:  Current Meds  Medication Sig  . aspirin EC 81 MG tablet Take 325 mg by mouth once.  Marland Kitchen ibuprofen (ADVIL,MOTRIN) 200 MG tablet Take 800 mg by mouth every 6 (six) hours as needed for moderate pain.     Allergies: Allergies as of 08/30/2017  . (No Known Allergies)   Past Medical History:  Diagnosis Date  . Hypertension   . Lymphedema of both lower extremities   . Morbid obesity (HCC)     Family History: Mother has ESRD on HD and diabetes.    Social History: No smoking, ethanol, or illicit drug use. Works at a daycare.   Review of Systems: A complete ROS was negative except as per HPI.  Physical Exam: Blood pressure (!) 119/44, pulse (!) 120, temperature (!) 101.3 F (38.5 C), temperature source Rectal, resp. rate (!) 22, SpO2 95 %. Physical Exam  Constitutional: She is  oriented to person, place, and time. She appears well-developed and well-nourished. No distress.  Morbidly obese  Lying comfortably in hospital stretcher  HENT:  Head: Normocephalic and atraumatic.  Mouth/Throat: Oropharynx is clear and moist.  Eyes: Pupils are equal, round, and reactive to light. EOM are normal. Right eye exhibits no discharge. Left eye exhibits no discharge.  Neck: Neck supple. No tracheal deviation present.  Cardiovascular: Normal rate, regular rhythm and intact distal pulses.  Exam  reveals no gallop and no friction rub.   No murmur heard. Pulmonary/Chest: Effort normal and breath sounds normal. She has no wheezes. She has no rales.  Anterior lung fields clear to auscultation  Abdominal: Soft. Bowel sounds are normal. She exhibits no distension. There is no tenderness.  Musculoskeletal: She exhibits edema.  Bilateral lower extremity lymphedema with lichenification and hyperpigmentation of skin Dorsalis pedis pulse +1 bilaterally Lower extremities are warm to touch. Onychomycosis of toenails Open wounds noted on bilateral lower extremities. Purulent drainage noted from the wounds on the right lower extremity. Please see images below.  Neurological: She is alert and oriented to person, place, and time.  Sensation to light touch and strength grossly intact throughout.  Skin: Skin is warm and dry.  Excess facial hair       Left lower extremity image below      EKG: personally reviewed my interpretation is sinus tachycardia (heart rate 137) and T-wave inversions in lead 1 and aVL. T-wave inversions are similar to prior EKG from October 2016.  CXR:  IMPRESSION: Top-normal cardiac size. No significant pulmonary vascular congestion, pulmonary edema, nor other acute cardiopulmonary abnormality. Electronically Signed   By: David  [place] M.D.   On: 08/30/2017 08:56  Assessment & Plan by Problem: Active Problems:   Sepsis [facility])  36 year old female with a past medical history of chronic lymphedema of bilateral lower extremities, hypertension, and morbid obesity presenting to the hospital with a chief complaint of chest pain and chills.  Sepsis secondary to infected wounds of right lower extremity: Patient has a history of chronic lymphedema and has open wounds on her bilateral lower extremities. Purulent drainage noted on the wounds on the right lower extremity. She presented febrile, tachycardic, tachypnic, and hypotensive. She had lactic acidosis (lacate 4.0) and  leukocytosis (WBC 22.5). X-ray of RLE showing superficial soft tissue infection. CXR negative. She received 3 L IV fluid boluses in the ED - blood pressure now improved to 110s/60s, HR in 110s. Started on vancomycin and Zosyn. Lactate mproved to 2.1 post fluid resuscitation. Initially her O2 sats were from 95-98% on room air. Later O2 sats noted to be 88-92% on room air. Now on 3 L oxygen via nasal cannula and satting 100% on room air. Her hypoxemia is likely related to sepsis. PE less likely - Wells score 1.5 (based on tachycardia). Will hold off checking a d-dimer at this time.  -Admit to stepdown -Supplemental O2 -Continue broad-spectrum antibiotics for now -NS@200  cc/hr  -Blood culture pending -UA pending -Continue trend lactate -Consult wound care -Strict intake and output -Repeat CBC in a.m. -Tylenol prn   Chest pain: Initial troponin negative. EKG not suggestive of any acute ischemic changes. Chest pain likely in the setting of demand ischemia from sepsis. Now chest pain free.  -Continue to trend troponin  Hx of hypertension: Patient does not take any medications at home. -Hold giving antihypertensives in the setting of sepsis  Morbid obesity: Her random blood glucose is within the normal range. She does not endorse any  symptoms of hyperglycemia such as polyuria, polydipsia, or polyphagia. -No indication to check A1c at this time   DVT ppx: Lovenox Diet: HH Code: FULL (confirmed per patient)   Dispo: Admit patient to Observation with expected length of stay less than 2 midnights.  Signed: John Giovanni, MD 08/30/2017, 12:53 PM  Pager: [phone]

## 2017-08-30 NOTE — ED Notes (Signed)
Report called  

## 2017-08-30 NOTE — Progress Notes (Signed)
Pharmacy Antibiotic Note  [name] is a 36 y.o. female admitted on 08/30/2017 with sepsis.  Pharmacy has been consulted for Zosyn and vancomycin dosing. Patient is morbidly obese. SCr 0.71. WBC 22.5.   Plan: -Zosyn 3.375 gm IV Q 8 hours (EI infusion) -Vancomycin 2.5 gm IV once, then 1.5 gm IV Q 8 hours.  -Monitor CBC, renal fx, cultures and clinical progress -VT at SS     Temp (24hrs), Avg:99 F (37.2 C), Min:99 F (37.2 C), Max:99 F (37.2 C)  No results for input(s): WBC, CREATININE, LATICACIDVEN, VANCOTROUGH, VANCOPEAK, VANCORANDOM, GENTTROUGH, GENTPEAK, GENTRANDOM, TOBRATROUGH, TOBRAPEAK, TOBRARND, AMIKACINPEAK, AMIKACINTROU, AMIKACIN in the last 168 hours.  CrCl cannot be calculated (Patient's most recent lab result is older than the maximum 21 days allowed.).    No Known Allergies  Antimicrobials this admission: Vanc 10/11 >>  Zosyn 10/11 >>   Dose adjustments this admission: None  Microbiology results: 10/11 BCx:   Thank you for allowing pharmacy to be a part of this patient's care.  Vinnie Level, PharmD., BCPS Clinical Pharmacist Pager [phone]

## 2017-08-30 NOTE — ED Notes (Signed)
Pt placed on 4liters N/c due to sats in the 70's sats came up to 100%

## 2017-08-31 LAB — BASIC METABOLIC PANEL
Anion gap: 11 (ref 5–15)
BUN: 14 mg/dL (ref 6–20)
CO2: 20 mmol/L — ABNORMAL LOW (ref 22–32)
Calcium: 8.6 mg/dL — ABNORMAL LOW (ref 8.9–10.3)
Chloride: 106 mmol/L (ref 101–111)
Creatinine, Ser: 0.88 mg/dL (ref 0.44–1.00)
GFR calc Af Amer: >60 mL/min (ref >60)
GFR calc non Af Amer: >60 mL/min (ref >60)
Glucose, Bld: 93 mg/dL (ref 65–99)
Potassium: 4.3 mmol/L (ref 3.5–5.1)
Sodium: 137 mmol/L (ref 135–145)

## 2017-08-31 LAB — URINALYSIS, ROUTINE W REFLEX MICROSCOPIC
Bilirubin Urine: NEGATIVE
Glucose, UA: NEGATIVE mg/dL
Ketones, ur: NEGATIVE mg/dL
Nitrite: NEGATIVE
Protein, ur: 100 mg/dL — AB
Specific Gravity, Urine: 1.041 — ABNORMAL HIGH (ref 1.005–1.030)
pH: 5 (ref 5.0–8.0)

## 2017-08-31 LAB — CBC
HCT: 35.4 % — ABNORMAL LOW (ref 36.0–46.0)
Hemoglobin: 11.6 g/dL — ABNORMAL LOW (ref 12.0–15.0)
MCH: 32 pg (ref 26.0–34.0)
MCHC: 32.8 g/dL (ref 30.0–36.0)
MCV: 97.8 fL (ref 78.0–100.0)
Platelets: 188 10*3/uL (ref 150–400)
RBC: 3.62 MIL/uL — ABNORMAL LOW (ref 3.87–5.11)
RDW: 13.6 % (ref 11.5–15.5)
WBC: 14.1 10*3/uL — ABNORMAL HIGH (ref 4.0–10.5)

## 2017-08-31 LAB — TROPONIN I: Troponin I: <0.03 ng/mL (ref <0.03)

## 2017-08-31 LAB — HIV ANTIBODY (ROUTINE TESTING W REFLEX): HIV Screen 4th Generation wRfx: NONREACTIVE

## 2017-08-31 MED ORDER — KETOROLAC TROMETHAMINE 60 MG/2ML IM SOLN
30.0000 mg | Freq: Once | INTRAMUSCULAR | Status: DC
  Filled 2017-08-31: qty 2

## 2017-08-31 MED ORDER — CAPSAICIN 0.025 % EX CREA
TOPICAL_CREAM | Freq: Two times a day (BID) | CUTANEOUS | Status: DC
  Administered 2017-08-31: 18:00:00 via TOPICAL
  Filled 2017-08-31: qty 60

## 2017-08-31 MED ORDER — VALACYCLOVIR HCL 500 MG PO TABS
1000.0000 mg | ORAL_TABLET | Freq: Three times a day (TID) | ORAL | Status: DC
  Administered 2017-08-31 – 2017-09-01 (×4): 1000 mg via ORAL
  Filled 2017-08-31 (×5): qty 2

## 2017-08-31 MED ORDER — ENOXAPARIN SODIUM 100 MG/ML ~~LOC~~ SOLN
100.0000 mg | SUBCUTANEOUS | Status: DC
  Administered 2017-08-31 – 2017-09-01 (×2): 100 mg via SUBCUTANEOUS
  Filled 2017-08-31 (×2): qty 1

## 2017-08-31 MED ORDER — KETOROLAC TROMETHAMINE 30 MG/ML IJ SOLN
30.0000 mg | Freq: Four times a day (QID) | INTRAMUSCULAR | Status: DC | PRN
  Administered 2017-08-31 – 2017-09-02 (×4): 30 mg via INTRAVENOUS
  Filled 2017-08-31 (×4): qty 1

## 2017-08-31 NOTE — Progress Notes (Signed)
Patient did not tolerate capsaicin cream to rash on back. Pt c/o intense burning once applied and asked for the cream to be wiped off.

## 2017-08-31 NOTE — Progress Notes (Signed)
Nutrition Brief Note  Patient identified on the Malnutrition Screening Tool (MST) Report for weight loss. Patient with 3% weight loss within the past 2 months which is not significant for the time frame. PO intake and appetite has been good.  Wt Readings from Last 15 Encounters:  08/31/17 (!) 498 lb 14.4 oz (226.3 kg)  06/28/17 (!) 514 lb (233.1 kg)  07/17/16 (!) 518 lb (235 kg)  09/01/15 (!) 517 lb (234.5 kg)  08/19/15 (!) 514 lb 2 oz (233.2 kg)  07/16/15 (!) 527 lb (239 kg)  03/26/13 (!) 520 lb (235.9 kg)  01/15/13 (!) 510 lb (231.3 kg)  02/28/12 (!) 486 lb (220.4 kg)    Body mass index is 64.05 kg/m. Patient meets criteria for class 3, extreme/morbid obesity based on current BMI.   Current diet order is heart healthy, patient is consuming approximately 100% of meals at this time. Labs and medications reviewed.   Nutrition focused physical exam completed.  No subcutaneous fat depletion noticed. Difficult to assess muscle mass due to a large amount of adipose tissue, but no muscle depletion noticed.  Patient with venous stasis ulcers to legs. Current intake is adequate to meet nutrition needs.  No nutrition interventions warranted at this time. If nutrition issues arise, please consult RD.   Joaquin Courts, RD, LDN, CNSC Pager [phone] After Hours Pager [phone]

## 2017-08-31 NOTE — Consult Note (Signed)
WOC Nurse wound consult note Reason for Consult: Consult requested for right leg wounds.  Pt has chronic lymphemia to BLE and has been referred to a lymphadenia clinic in the past.  She states she does not go there any more but is unable to provide a reason.  She is very groggy and does not answer questions. Wound type: Several full thickness wounds to right posterior leg. Measurement:2X2X.2cm, 80% red, 20& yellow, mod amt yellow drainage, no odor 5X5X.3cm, 85% red, 15% yellow, mod amt yellow drainage, no odor 5.5X5.5X.3cm, 50% yellow, 50% red, mod amt yellow drainage, no odor Periwound: Dark scaly skin to BLE, appearance consistent with chronic lymphedema. Dressing procedure/placement/frequency: Aquacel to absorb drainage and provide antimicrobial benefits.  ABD to absorb drainage, ace wrap for light compression.  Pt could benefit from followup at an outpatient wound clinic, if she is willing to attend the appointments. Please order if desired. Please re-consult if further assistance is needed.  Thank-you,  Cammie Mcgee MSN, RN, CWOCN, [place], CNS [phone]

## 2017-08-31 NOTE — Progress Notes (Signed)
Unable to obtain UA at this time. Patient on menstrual cycle.

## 2017-08-31 NOTE — Progress Notes (Signed)
   Subjective: Patient seen and examined. The patient was crying when entering the room. She was complaining of severe upper back pain that started early this morning. She said that her skin was irritated last night in that area after the hospital bed was cleaned, but the pain has now gotten significantly worse. She denies chest pain or shortness of breath. Her leg wounds are not causing her any discomfort at this time. Denies N/V/D, fevers or chills.   Objective:  Vital signs in last 24 hours: Vitals:   08/31/17 0300 08/31/17 0651 08/31/17 0746 08/31/17 1150  BP: 119/81  115/67 110/87  Pulse: (!) 121  (!) 102 (!) 101  Resp: (!) Temp:  97.8 F (36.6 C) 98.6 F (37 C) 98.1 F (36.7 C)  TempSrc:  Axillary Oral Oral  SpO2: 91%  97% 99%  Weight: (!) 498 lb 14.4 oz (226.3 kg)     Height:       Physical Exam  Constitutional: She appears well-developed and well-nourished. She appears distressed.  HENT:  Head: Normocephalic and atraumatic.  +facial hair in area of chin  Eyes: Pupils are equal, round, and reactive to light. EOM are normal.  Neck: Normal range of motion. Neck supple.  Cardiovascular: Regular rhythm, normal heart sounds and intact distal pulses.  Tachycardia present.   Pulmonary/Chest: Effort normal and breath sounds normal.  Skin: Rash noted. Rash is vesicular.     Bilateral lower extremity lymphedema with lichenification and hyperpigmentation of skin Dorsalis pedis pulse +1 bilaterally Lower extremities are warm to touch. Open wounds noted on bilateral lower extremities: 3 Deep, purulent wounds on right LE; 1 single ulceration on left lower extremity, non-draining and superficial. No TTP.  Purulent drainage noted from the wounds on the right lower extremity. Onychomycosis of toenails   Assessment/Plan:  Active Problems:   Sepsis (HCC)  Sepsis secondary to infected wounds of right lower extremity Patient has a history of chronic lymphedema and has open  wounds on her bilateral lower extremities initially presented  presented febrile, tachycardic, tachypnic, and hypotensive. Patient remains tachycardic but is now afebrile, leukocytosis trending down 22.5>>14.1 today. Lactic acid also trended down to 1.8 this AM. BP stable at 110s/80s. Wound care evaluated and treated the patient's LE wounds.  -Continue Zosyn and Vanc -Getting wound cultures -NS@200  cc/hr  -Blood culture pending -UA pending  Rash New onset vesicular/bullous like rash. May be a severe contact dermatitis as the patient said her back was bothering her after the hospital bed was cleaned. Bullous/vesicular appearance is concerning for HZV but it is not in a typical dermatomal distribution.  -Empirically treating for HZV  -Benadryl q6 PRN -Toradol PRN for pain  -Contact precautions -Will continue to reassess   Chest pain Resolved. Troponin trend: 0.03, 0.07, 0.03. EKG not suggestive of any acute ischemic changes. Chest pain likely in the setting of demand ischemia from sepsis. Now chest pain free.   Morbid obesity Her random blood glucose is within the normal range. She does not endorse any symptoms of hyperglycemia such as polyuria, polydipsia, or polyphagia. No hx of diabetes.  -No indication to check A1c at this time   DVT ppx: Lovenox Diet: HH Code: FULL (confirmed per patient)    Dispo: Anticipated discharge in approximately 2-3 day(s).   Toney Rakes, MD 08/31/2017, 12:48 PM Pager: [phone]

## 2017-09-01 DIAGNOSIS — A419 Sepsis, unspecified organism: Principal | ICD-10-CM

## 2017-09-01 DIAGNOSIS — R21 Rash and other nonspecific skin eruption: Secondary | ICD-10-CM

## 2017-09-01 DIAGNOSIS — S81802A Unspecified open wound, left lower leg, initial encounter: Secondary | ICD-10-CM

## 2017-09-01 DIAGNOSIS — L03116 Cellulitis of left lower limb: Secondary | ICD-10-CM

## 2017-09-01 LAB — BASIC METABOLIC PANEL
Anion gap: 5 (ref 5–15)
BUN: 14 mg/dL (ref 6–20)
CO2: 28 mmol/L (ref 22–32)
Calcium: 8.6 mg/dL — ABNORMAL LOW (ref 8.9–10.3)
Chloride: 104 mmol/L (ref 101–111)
Creatinine, Ser: 0.89 mg/dL (ref 0.44–1.00)
GFR calc Af Amer: >60 mL/min (ref >60)
GFR calc non Af Amer: >60 mL/min (ref >60)
Glucose, Bld: 96 mg/dL (ref 65–99)
Potassium: 4 mmol/L (ref 3.5–5.1)
Sodium: 137 mmol/L (ref 135–145)

## 2017-09-01 LAB — CBC
HCT: 35 % — ABNORMAL LOW (ref 36.0–46.0)
Hemoglobin: 11.2 g/dL — ABNORMAL LOW (ref 12.0–15.0)
MCH: 31.6 pg (ref 26.0–34.0)
MCHC: 32 g/dL (ref 30.0–36.0)
MCV: 98.9 fL (ref 78.0–100.0)
Platelets: 219 10*3/uL (ref 150–400)
RBC: 3.54 MIL/uL — ABNORMAL LOW (ref 3.87–5.11)
RDW: 13.6 % (ref 11.5–15.5)
WBC: 11 10*3/uL — ABNORMAL HIGH (ref 4.0–10.5)

## 2017-09-01 MED ORDER — SODIUM CHLORIDE 0.9 % IV SOLN
3.0000 g | Freq: Four times a day (QID) | INTRAVENOUS | Status: DC
  Administered 2017-09-01 – 2017-09-02 (×4): 3 g via INTRAVENOUS
  Filled 2017-09-01 (×7): qty 3

## 2017-09-01 NOTE — Progress Notes (Signed)
   Subjective:  Patient states she feels better today. She denies further pain or discomfort from her back rash. She states the pain in her legs is improved and is now mainly in her left foot due to swelling. She denies fevers or chills, chest pain, shortness of breath, nausea, vomiting.    Objective:  Vital signs in last 24 hours: Vitals:   09/01/17 0407 09/01/17 0623 09/01/17 0910 09/01/17 1153  BP: 134/71  (!) 143/71 116/81  Pulse: 100   82  Resp: 19  19 (!) 21  Temp: 98.6 F (37 C)  97.9 F (36.6 C) 97.8 F (36.6 C)  TempSrc: Oral  Oral Oral  SpO2: 96%  98% 98%  Weight:  (!) 505 lb 1.2 oz (229.1 kg)    Height:       Physical Exam  Constitutional: She is oriented to person, place, and time. She appears well-developed and well-nourished.  Sitting up in chair comfortably  HENT:  +facial hair in area of chin  Eyes: EOM are normal.  Cardiovascular: Regular rhythm, normal heart sounds and intact distal pulses.  Tachycardia present.  Exam reveals no gallop and no friction rub.   No murmur heard. Pulmonary/Chest: Effort normal and breath sounds normal. No respiratory distress. She has no rales.  Abdominal: Soft. There is no tenderness.  Neurological: She is alert and oriented to person, place, and time.  Skin: Rash noted. Rash is vesicular.     Bilateral lower extremity lymphedema with lichenification and hyperpigmentation of skin. Excoriations present bilaterally that do not appear infected; wounds in question bandaged by wound care. Onychomycosis of toenails Rash on her back is vesicular with surrounding discoloration of skin on both L&R sides of upper back in distribution of a contact dermatitis; no drainage or tenderness today   Assessment/Plan:  Active Problems:   Sepsis (HCC)   Lymphedema   Multiple open wounds of left lower extremity   Papular rash, localized  Sepsis secondary to infected wounds of right lower extremity Patient has a history of chronic lymphedema  and has open wounds on her bilateral lower extremities initially presented  presented febrile, tachycardic, tachypnic, and hypotensive. No other source of infection has been found. Patient is clinically improved. WBC continues to trend down and she has remained afebrile. -Stop vanc/zosyn and narrow to Unasyn; if spikes fever or WBC uptrending, would broaden coverage for MRSA -f/u wound cultures - gram stain show G-rods and G+ cocci in pairs and clusters -f/u blood cultures - NGTD x1 day  Rash New onset vesicular/papular rash on upper back. May be a severe contact dermatitis as the patient said her back was bothering her after the hospital bed was cleaned. Does not appear like HZV.   -stop valtrex  -Benadryl q6 PRN -Toradol PRN for pain  -Contact precautions -Will continue to reassess   Dispo: Anticipated discharge in approximately 2-3 day(s).   Nyra Market, MD 09/01/2017, 4:09 PM

## 2017-09-02 LAB — CBC
HCT: 35.6 % — ABNORMAL LOW (ref 36.0–46.0)
Hemoglobin: 11.5 g/dL — ABNORMAL LOW (ref 12.0–15.0)
MCH: 31.4 pg (ref 26.0–34.0)
MCHC: 32.3 g/dL (ref 30.0–36.0)
MCV: 97.3 fL (ref 78.0–100.0)
Platelets: 264 10*3/uL (ref 150–400)
RBC: 3.66 MIL/uL — ABNORMAL LOW (ref 3.87–5.11)
RDW: 13.3 % (ref 11.5–15.5)
WBC: 10.4 10*3/uL (ref 4.0–10.5)

## 2017-09-02 MED ORDER — AMOXICILLIN-POT CLAVULANATE 875-125 MG PO TABS: 1.0000 | ORAL_TABLET | Freq: Two times a day (BID) | ORAL | 0 refills | Status: AC

## 2017-09-02 MED ORDER — POLYETHYLENE GLYCOL 3350 17 G PO PACK
17.0000 g | PACK | Freq: Once | ORAL | Status: AC
  Administered 2017-09-02: 17 g via ORAL
  Filled 2017-09-02: qty 1

## 2017-09-02 NOTE — Discharge Summary (Signed)
Name: [name] MRN: [id number] DOB: December 22, 1980 36 y.o. PCP: Patient, No Pcp Per  Date of Admission: 08/30/2017  8:20 AM Date of Discharge: 09/02/2017 Attending Physician: Levert Feinstein, MD  Discharge Diagnosis: 1. Sepsis 2. LE wounds Active Problems:   Sepsis (HCC)   Lymphedema   Multiple open wounds of left lower extremity   Papular rash, localized   Discharge Medications: Allergies as of 09/02/2017   No Known Allergies     Medication List    STOP taking these medications   aspirin EC 81 MG tablet   cephALEXin 500 MG capsule Commonly known as:  KEFLEX   cyclobenzaprine 10 MG tablet Commonly known as:  FLEXERIL   hydrochlorothiazide 25 MG tablet Commonly known as:  HYDRODIURIL   HYDROcodone-acetaminophen 5-325 MG tablet Commonly known as:  NORCO/VICODIN   sulfamethoxazole-trimethoprim 800-160 MG tablet Commonly known as:  BACTRIM DS,SEPTRA DS     TAKE these medications   amoxicillin-clavulanate 875-125 MG tablet Commonly known as:  AUGMENTIN Take 1 tablet by mouth 2 (two) times daily.   ibuprofen 200 MG tablet Commonly known as:  ADVIL,MOTRIN Take 800 mg by mouth every 6 (six) hours as needed for moderate pain.       Disposition and follow-up:   JessicaTika R Holohan was discharged from [facility] in Stable condition.  At the hospital follow up visit please address:  1.   LE wounds: --patient to follow up with wound care [facility]) --she was provided with appropriate supplies until she is able to follow up with them --Augmentin 875-125mg  BID w/ end date of 10/25 to complete 14 day course --please evaluate wounds for appropriate healing and any signs of further infection  2.  Labs / imaging needed at time of follow-up: none  3.  Pending labs/ test needing follow-up:  Blood cultures 08/30/17 - NGTD x 3 days Wound culture 08/31/17 - multiple species, none predominant, pending 5 day growth  Follow-up  Appointments: Follow-up Information    [facility]. Schedule an appointment as soon as possible for a visit.   Why:  To be seen in 1 to 2 weeks Contact information: [address] [place] [address] [phone]       [facility]. Schedule an appointment as soon as possible for a visit.   Why:  To be seen in 1 to 2 weeks Contact information: [address] [place] [address] [phone]         Hospital Course by problem list: Active Problems:   Sepsis (HCC)   Lymphedema   Multiple open wounds of left lower extremity   Papular rash, localized   Sepsis LE wounds Lymphedema: [name] is a 36yo female who presented to [facility] with shortness of breath and chest pain, initially and was found to be borderline hypotensive, febrile, with lactic acidosis and infected LE wounds consistent with sepsis (pictures in media tab). Her BP, LA and chest pain resolved with fluid resuscitation. EKG was unremarkable and troponins trended negative (neg, 0.07, neg); chest pain likely from demand in setting of sepsis. Patient stated that she has actually had worsening LE pain, chills, and had worsening of her chronic wounds over the last couple of weeks. She has a history of previous wounds which were followed by wound care but apparently had healed well. When they recurred, patient attempted self management with left-over supplies. She has significant LE lymphedema confirmed by  biopsy. She reports significant pruritus from the lymphedema and has excoriations on bil LE which is likely how the wounds appeared. Patient was empirically started on Vanc and Zosyn for her LE wounds and responded well. She was changed to Unasyn for one day prior to discharge with continued resolution of leukocytosis and fever, and her BCx had been negative x3 days. It was decided to discharge the patient on Augmentin 875-125mg  BID to complete  up to a 14 day course with close follow up. Patient was given info on [facility] and [facility] as options for follow up and establishment of care. At follow up, please assess status of healing LE wounds, need for further antibiotic treatment, and that she has been able to be set up with a wound care center.  Papular rash: On hospital day 1 of admission, patient developed sudden onset rash on her upper back soon after affected and exposed area after contact with hospital bed which had just been wiped down by nursing. Initially there was concern for HZV and patient empirically started on valtrex, however, this was discontinued the following day due to low likelihood of multidermatomal though localized HZV infection. It is more likely that it is severe contact dermatitis.   Discharge Vitals:   BP 113/77 (BP Location: Right Arm)   Pulse 97   Temp 97.8 F (36.6 C) (Oral)   Resp 19   Ht  (1.88 m)   Wt (!) 505 lb 9.6 oz (229.3 kg)   SpO2 97%   BMI 64.92 kg/m   Pertinent Labs, Studies, and Procedures:  CBC Latest Ref Rng & Units 09/02/2017 09/01/2017 08/31/2017  WBC 4.0 - 10.5 K/uL 10.4 11.0(H) 14.1(H)  Hemoglobin 12.0 - 15.0 g/dL 11.5(L) 11.2(L) 11.6(L)  Hematocrit 36.0 - 46.0 % 35.6(L) 35.0(L) 35.4(L)  Platelets 150 - 400 K/uL 264 219 188   BMP Latest Ref Rng & Units 09/01/2017 08/31/2017 08/30/2017  Glucose 65 - 99 mg/dL 96 93 87  BUN 6 - 20 mg/dL Creatinine 0.44 - 1.00 mg/dL 2.13 0.86 5.78  Sodium 135 - 145 mmol/L 137 137 140  Potassium 3.5 - 5.1 mmol/L 4.0 4.3 4.1  Chloride 101 - 111 mmol/L 104 106 103  CO2 22 - 32 mmol/L 28 20(L) 27  Calcium 8.9 - 10.3 mg/dL 4.6(N) 6.2(X) 9.5   HIV screen 08/31/17 non-reactive Blood culture 08/30/17: NGTD x4days Wound culture 08/31/17: multiple organisms present, none predominant; no anaerobes isolated; culture in progress for 5 days  Discharge Instructions: Discharge Instructions    Call MD for:  difficulty breathing, headache or visual  disturbances    Complete by:  As directed    Call MD for:  extreme fatigue    Complete by:  As directed    Call MD for:  hives    Complete by:  As directed    Call MD for:  persistant dizziness or light-headedness    Complete by:  As directed    Call MD for:  persistant nausea and vomiting    Complete by:  As directed    Call MD for:  redness, tenderness, or signs of infection (pain, swelling, redness, odor or green/yellow discharge around incision site)    Complete by:  As directed    Call MD for:  severe uncontrolled pain    Complete by:  As directed    Call MD for:  temperature >100.4    Complete by:  As directed    Diet - low sodium heart healthy  Complete by:  As directed    Discharge instructions    Complete by:  As directed    Please take Augmentin one pill twice daily until until you finish the bottle of antibiotics.  Please call the the wound center tomorrow to set up appointment to be seen as soon as possible. Please call either the [facility] or the [facility] tomorrow to schedule a hospital follow up appointment to be seen in 1 to 2 weeks.   Increase activity slowly    Complete by:  As directed      Signed: Nyra Market, MD 09/02/2017, 5:59 PM

## 2017-09-02 NOTE — Care Management Note (Signed)
Case Management Note  Patient Details  Name: [name] MRN: [id number] Date of Birth: 1981/03/07  Subjective/Objective:  CM received consult from MD for Wound Care supplies. Pt does not need HHRN and will be referred to Wound Care Clinic. Pt will be able to ask nurses for supplies prior tp disch                  Action/Plan: CM will sign off for now but will be available should additional discharge needs arise or disposition change.   Expected Discharge Date:                  Expected Discharge Plan:     In-House Referral:     Discharge planning Services  CM Consult, Other - See comment (MD requesting wound care supplies for home)  Post Acute Care Choice:    Choice offered to:     DME Arranged:    DME Agency:     HH Arranged:    HH Agency:     Status of Service:  In process, will continue to follow  If discussed at Long Length of Stay Meetings, dates discussed:    Additional Comments:  Yvone Neu, RN 09/02/2017, 4:14 PM

## 2017-09-02 NOTE — Progress Notes (Signed)
   Subjective:  Patient states she feels well today. She denies fevers, chills, nausea, vomiting, shortness of breath or chest pain. She states that she had some mild leg and upper back pain earlier that was relieved with toradol.   Objective:  Vital signs in last 24 hours: Vitals:   09/02/17 0500 09/02/17 0800 09/02/17 0817 09/02/17 1235  BP: 122/73  (!) 137/96 113/77  Pulse: (!) 104  85 97  Resp: (!) Temp: 98.5 F (36.9 C)  97.8 F (36.6 C) 97.8 F (36.6 C)  TempSrc: Oral  Oral Oral  SpO2: 97%     Weight: (!) 505 lb 9.6 oz (229.3 kg)     Height:       Physical Exam  Constitutional: She is oriented to person, place, and time. She appears well-developed and well-nourished.  Sitting up in chair comfortably  HENT:  +facial hair in area of chin  Eyes: EOM are normal.  Cardiovascular: Regular rhythm, normal heart sounds and intact distal pulses.  Tachycardia present.  Exam reveals no gallop and no friction rub.   No murmur heard. Pulmonary/Chest: Effort normal and breath sounds normal. No respiratory distress. She has no rales.  Abdominal: Soft. There is no tenderness.  Neurological: She is alert and oriented to person, place, and time.  Skin: Rash noted. Rash is vesicular.     Bilateral lower extremity lymphedema with lichenification and hyperpigmentation of skin. Excoriations present bilaterally that do not appear infected; wounds clean with good granulation tissue and no drainage. Onychomycosis of toenails Rash on her back is vesicular with surrounding discoloration of skin on both L&R sides of upper back with surrounding erythema and induration; no drainage or tenderness today   Assessment/Plan:  Active Problems:   Sepsis (HCC)   Lymphedema   Multiple open wounds of left lower extremity   Papular rash, localized  Sepsis secondary to infected wounds of right lower extremity Sepsis has resolved. Patient has a history of chronic lymphedema and has open wounds  on her bilateral lower extremities. Patient is clinically improved. WBC is wnl. She has continued to do well with the switch to Unasyn yesterday. Wound cultures show no predominant bacterium though will still grow out to 5 days; BCx have been negative x2d. -will reassess in PM and possibly discharge home with PO antibiotics to complete 10 day course. -wound care outpatient -will provide patient with contact info with our clinic and [facility] for follow up and establishing care.  Rash New onset vesicular/papular rash on upper back. May be a severe contact dermatitis as the patient said her back was bothering her after the hospital bed was cleaned. Does not appear like HZV.   -Benadryl q6 PRN -Toradol PRN for pain  -Contact precautions  Dispo: Anticipated discharge in approximately 0-1 day(s).   Nyra Market, MD 09/02/2017, 1:50 PM

## 2017-09-04 LAB — CULTURE, BLOOD (ROUTINE X 2)
Culture: NO GROWTH
Culture: NO GROWTH
Special Requests: ADEQUATE
Special Requests: ADEQUATE

## 2017-09-05 LAB — AEROBIC/ANAEROBIC CULTURE W GRAM STAIN (SURGICAL/DEEP WOUND)

## 2017-09-05 LAB — AEROBIC/ANAEROBIC CULTURE (SURGICAL/DEEP WOUND)

## 2017-09-10 ENCOUNTER — Encounter: Payer: Self-pay | Attending: Surgery | Admitting: Surgery

## 2017-09-10 DIAGNOSIS — I87311 Chronic venous hypertension (idiopathic) with ulcer of right lower extremity: Secondary | ICD-10-CM | POA: Insufficient documentation

## 2017-09-10 DIAGNOSIS — L97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d: Secondary | ICD-10-CM | POA: Insufficient documentation

## 2017-09-10 DIAGNOSIS — I890 Lymphedema, not elsewhere classified: Secondary | ICD-10-CM | POA: Insufficient documentation

## 2017-09-10 DIAGNOSIS — I10 Essential (primary) hypertension: Secondary | ICD-10-CM | POA: Insufficient documentation

## 2017-09-10 DIAGNOSIS — Z6841 Body Mass Index (BMI) 40.0 and over, adult: Secondary | ICD-10-CM | POA: Insufficient documentation

## 2017-09-11 NOTE — Progress Notes (Signed)
[name], [name] ([id number]) Visit Report for 09/10/2017 Abuse/Suicide Risk Screen Details Patient Name: [name], [name] Date of Service: 09/10/2017 8:00 AM Medical Record Number: [id number] Patient Account Number: [id number] Date of Birth/Sex: Jan 07, 1981 (36 y.o. Female) Treating RN: Curtis Sitesorthy, Joanna Primary Care Pharrell Ledford: PATIENT, NO Other Clinician: Referring Braxen Dobek: Referral, Self Treating Selma Mink/Extender: Rudene ReBritto, Errol Weeks in Treatment: 0 Abuse/Suicide Risk Screen Items Answer ABUSE/SUICIDE RISK SCREEN: Has anyone close to you tried to hurt or harm you recentlyo No Do you feel uncomfortable with anyone in your familyo No Has anyone forced you do things that you didnot want to doo No Do you have any thoughts of harming yourselfo No Patient displays signs or symptoms of abuse and/or neglect. No Electronic Signature(s) Signed: 09/10/2017 4:16:02 PM By: Curtis Sitesorthy, Joanna Entered By: Curtis Sitesorthy, Joanna on 09/10/2017 08:21:27 [name], [name] ([id number]) -------------------------------------------------------------------------------- Activities of Daily Living Details Patient Name: [name], [name] Date of Service: 09/10/2017 8:00 AM Medical Record Number: [id number] Patient Account Number: [id number] Date of Birth/Sex: Jan 07, 1981 (36 y.o. Female) Treating RN: Curtis Sitesorthy, Joanna Primary Care Haven Foss: PATIENT, NO Other Clinician: Referring Keren Alverio: Referral, Self Treating Webster Patrone/Extender: Rudene ReBritto, Errol Weeks in Treatment: 0 Activities of Daily Living Items Answer Activities of Daily Living (Please select one for each item) Drive Automobile Completely Able Take Medications Completely Able Use Telephone Completely Able Care for Appearance Completely Able Use Toilet Completely Able Bath / Shower Completely Able Dress Self Completely Able Feed Self Completely Able Walk Completely Able Get In / Out Bed Completely Able Housework Completely Able Prepare Meals Completely Able Handle  Money Completely Able Shop for Self Completely Able Electronic Signature(s) Signed: 09/10/2017 4:16:02 PM By: Curtis Sitesorthy, Joanna Entered By: Curtis Sitesorthy, Joanna on 09/10/2017 08:21:50 Killian, Cristal DeerERICA R. ([id number]) -------------------------------------------------------------------------------- Education Assessment Details Patient Name: [name], [name] Date of Service: 09/10/2017 8:00 AM Medical Record Number: [id number] Patient Account Number: [id number] Date of Birth/Sex: Jan 07, 1981 (36 y.o. Female) Treating RN: Curtis Sitesorthy, Joanna Primary Care Noriko Macari: PATIENT, NO Other Clinician: Referring Mosie Angus: Referral, Self Treating Wayman Hoard/Extender: Rudene ReBritto, Errol Weeks in Treatment: 0 Primary Learner Assessed: Patient Learning Preferences/Education Level/Primary Language Learning Preference: Explanation, Demonstration Highest Education Level: High School Preferred Language: English Cognitive Barrier Assessment/Beliefs Language Barrier: No Translator Needed: No Memory Deficit: No Emotional Barrier: No Cultural/Religious Beliefs Affecting Medical Care: No Physical Barrier Assessment Impaired Vision: No Impaired Hearing: No Decreased Hand dexterity: No Knowledge/Comprehension Assessment Knowledge Level: Medium Comprehension Level: Medium Ability to understand written Medium instructions: Ability to understand verbal Medium instructions: Motivation Assessment Anxiety Level: Calm Cooperation: Cooperative Education Importance: Acknowledges Need Interest in Health Problems: Asks Questions Perception: Coherent Willingness to Engage in Self- Medium Management Activities: Readiness to Engage in Self- Medium Management Activities: Electronic Signature(s) Signed: 09/10/2017 4:16:02 PM By: Curtis Sitesorthy, Joanna Entered By: Curtis Sitesorthy, Joanna on 09/10/2017 08:22:11 Mullett, Cristal DeerERICA R. ([id number]) -------------------------------------------------------------------------------- Fall Risk Assessment  Details Patient Name: [name], [name] Date of Service: 09/10/2017 8:00 AM Medical Record Number: [id number] Patient Account Number: [id number] Date of Birth/Sex: Jan 07, 1981 (36 y.o. Female) Treating RN: Curtis Sitesorthy, Joanna Primary Care Katalea Ucci: PATIENT, NO Other Clinician: Referring Louine Tenpenny: Referral, Self Treating Bayden Gil/Extender: Rudene ReBritto, Errol Weeks in Treatment: 0 Fall Risk Assessment Items Have you had 2 or more falls in the last 12 monthso 0 No Have you had any fall that resulted in injury in the last 12 monthso 0 No FALL RISK ASSESSMENT: History of falling - immediate or within 3 months 0 No Secondary diagnosis 0 No Ambulatory aid None/bed rest/wheelchair/nurse 0 Yes Crutches/cane/walker 0 No Furniture 0 No IV Access/Saline Lock  0 No Gait/Training Normal/bed rest/immobile 0 Yes Weak 0 No Impaired 0 No Mental Status Oriented to own ability 0 Yes Electronic Signature(s) Signed: 09/10/2017 4:16:02 PM By: Curtis Sites Entered By: Curtis Sites on 09/10/2017 08:22:28 Rossel, Cristal Deer ([id number]) -------------------------------------------------------------------------------- Nutrition Risk Assessment Details Patient Name: Jessica Casey R. Date of Service: 09/10/2017 8:00 AM Medical Record Number: [id number] Patient Account Number: [id number] Date of Birth/Sex: 11/27/1980 (36 y.o. Female) Treating RN: Curtis Sites Primary Care Kandy Towery: PATIENT, NO Other Clinician: Referring Vitaliy Eisenhour: Referral, Self Treating Caniyah Murley/Extender: Rudene Re in Treatment: 0 Height (in): Weight (lbs): Body Mass Index (BMI): Nutrition Risk Assessment Items NUTRITION RISK SCREEN: I have an illness or condition that made me change the kind and/or amount of 0 No food I eat I eat fewer than two meals per day 0 No I eat few fruits and vegetables, or milk products 0 No I have three or more drinks of beer, liquor or wine almost every day 0 No I have tooth or mouth problems that make it  hard for me to eat 0 No I don't always have enough money to buy the food I need 0 No I eat alone most of the time 0 No I take three or more different prescribed or over-the-counter drugs a day 0 No Without wanting to, I have lost or gained 10 pounds in the last six months 0 No I am not always physically able to shop, cook and/or feed myself 0 No Nutrition Protocols Good Risk Protocol 0 No interventions needed Moderate Risk Protocol Electronic Signature(s) Signed: 09/10/2017 4:16:02 PM By: Curtis Sites Entered By: Curtis Sites on 09/10/2017 08:22:37

## 2017-09-11 NOTE — Progress Notes (Signed)
KIM, OKI ([id number]) Visit Report for 09/10/2017 Chief Complaint Document Details Patient Name: [name], [name] Date of Service: 09/10/2017 8:00 AM Medical Record Number: [id number] Patient Account Number: [id number] Date of Birth/Sex: 11-25-1980 (36 y.o. Female) Treating RN: Curtis Sites Primary Care Provider: PATIENT, NO Other Clinician: Referring Provider: Referral, Self Treating Provider/Extender: Rudene Re in Treatment: 0 Information Obtained from: Patient Chief Complaint Patient presents for treatment of an open ulcer due to venous insufficiency to the right lower extremity which she's had for about a month Electronic Signature(s) Signed: 09/10/2017 9:18:26 AM By: Evlyn Kanner MD, FACS Entered By: Evlyn Kanner on 09/10/2017 09:18:26 Bralley, Sutton R. ([id number]) -------------------------------------------------------------------------------- HPI Details Patient Name: Jessica Casey R. Date of Service: 09/10/2017 8:00 AM Medical Record Number: [id number] Patient Account Number: [id number] Date of Birth/Sex: Jul 18, 1981 (36 y.o. Female) Treating RN: Curtis Sites Primary Care Provider: PATIENT, NO Other Clinician: Referring Provider: Referral, Self Treating Provider/Extender: Rudene Re in Treatment: 0 History of Present Illness Location: right lower extremity lateral calf several large areas Quality: Patient reports experiencing a sharp pain to affected area(s). Severity: Patient states wound are getting worse. Duration: Patient has had the wound for < 5 weeks prior to presenting for treatment Timing: Pain in wound is Intermittent (comes and goes Context: The wound would happen gradually Modifying Factors: Other treatment(s) tried include:admission to the hospital for sepsis and a full workup with treatment with IV antibiotics and discharged on oral antibiotics Associated Signs and Symptoms: Patient reports having increase discharge. HPI  Description: 36 year old patient well known to our [facility] wound care clinic where she has been seen since 2016 for bilateral lower extremity venous insufficiency disease with lymphedema and multiple ulcerations associated with morbid obesity. she had custom-made compression stockings and lymphedema pumps which were used in the past. most recently she was admitted to the hospital between October 11 and 09/02/2017 with sepsis, lower extremity wounds and lymphedema.she was initially treated in the outpatient with Keflex and Bactrim. she was initially treated in the hospital with vancomycin and Zosyn and changed over to Unasyn until her white count improved and her blood cultures were negative for 3 days. After her inpatient management she was discharged home on Augmentin to end on 09/13/2017 with a 14 day course. she has had outpatient vascular duplex scans completed in November 2017 and her right ABI was 1.1 and the left ABI is 1.3. she had normal toe brachial indices bilaterally.she had three-vessel runoff in the right lower extremity and two-vessel runoff in the left lower extremity. on questioning the patient she does have custom made compression stockings and also has a lymphedema pump but has not been using it appropriately and has not been taking good care of herself Electronic Signature(s) Signed: 09/10/2017 9:19:39 AM By: Evlyn Kanner MD, FACS Previous Signature: 09/10/2017 8:57:57 AM Version By: Evlyn Kanner MD, FACS Previous Signature: 09/10/2017 8:52:26 AM Version By: Evlyn Kanner MD, FACS Previous Signature: 09/10/2017 8:49:00 AM Version By: Evlyn Kanner MD, FACS Previous Signature: 09/10/2017 8:47:38 AM Version By: Evlyn Kanner MD, FACS Previous Signature: 09/10/2017 8:44:01 AM Version By: Evlyn Kanner MD, FACS Entered By: Evlyn Kanner on 09/10/2017 09:19:39 Mccuistion, Cristal Deer  ([id number]) -------------------------------------------------------------------------------- Physical Exam Details Patient Name: Jessica Casey R. Date of Service: 09/10/2017 8:00 AM Medical Record Number: [id number] Patient Account Number: [id number] Date of Birth/Sex: [birth year] (36 y.o. Female) Treating RN: Curtis Sites Primary Care Provider: PATIENT, NO Other Clinician: Referring Provider: Referral, Self Treating Provider/Extender: Rudene Re in Treatment: 0 Constitutional .  Pulse regular. Respirations normal and unlabored. Afebrile. . Eyes Nonicteric. Reactive to light. Ears, Nose, Mouth, and Throat Lips, teeth, and gums WNL.Marland Kitchen. Moist mucosa without lesions. Neck supple and nontender. No palpable supraclavicular or cervical adenopathy. Normal sized without goiter. Respiratory WNL. No retractions.. Cardiovascular Pedal Pulses WNL. ABI could not be measured. No clubbing, cyanosis or edema. Chest Breasts symmetical and no nipple discharge.. Breast tissue WNL, no masses, lumps, or tenderness.. Gastrointestinal (GI) Abdomen without masses or tenderness.. No liver or spleen enlargement or tenderness.. Lymphatic No adneopathy. No adenopathy. No adenopathy. Musculoskeletal Adexa without tenderness or enlargement.. Digits and nails w/o clubbing, cyanosis, infection, petechiae, ischemia, or inflammatory conditions.. Integumentary (Hair, Skin) No suspicious lesions. No crepitus or fluctuance. No peri-wound warmth or erythema. No masses.Marland Kitchen. Psychiatric Judgement and insight Intact.. No evidence of depression, anxiety, or agitation.. Notes she has massive stage III lymphedema bilaterally with large ulcerations on the right lateral calf with some slough and no sharp debridement was done today because of significant tenderness. Electronic Signature(s) Signed: 09/10/2017 9:20:23 AM By: Evlyn KannerBritto, Zyan Coby MD, FACS Entered By: Evlyn KannerBritto, Amorina Doerr on 09/10/2017 09:20:22 Jessica HindLEATH, Cale R.  ([id number]) -------------------------------------------------------------------------------- Physician Orders Details Patient Name: Jessica CaseyLEATH, Camay R. Date of Service: 09/10/2017 8:00 AM Medical Record Number: [id number] Patient Account Number: [id number] Date of Birth/Sex: 1981/06/13 (36 y.o. Female) Treating RN: Curtis Sitesorthy, Joanna Primary Care Provider: PATIENT, NO Other Clinician: Referring Provider: Referral, Self Treating Provider/Extender: Rudene ReBritto, Delorus Langwell Weeks in Treatment: 0 Verbal / Phone Orders: No Diagnosis Coding Wound Cleansing Wound #1 Right,Proximal,Lateral Lower Leg o Clean wound with Normal Saline. o May shower with protection. Wound #2 Right,Distal,Lateral Lower Leg o Clean wound with Normal Saline. o May shower with protection. Anesthetic Wound #1 Right,Proximal,Lateral Lower Leg o Topical Lidocaine 4% cream applied to wound bed prior to debridement Wound #2 Right,Distal,Lateral Lower Leg o Topical Lidocaine 4% cream applied to wound bed prior to debridement Primary Wound Dressing Wound #1 Right,Proximal,Lateral Lower Leg o Silvercel Non-Adherent Wound #2 Right,Distal,Lateral Lower Leg o Silvercel Non-Adherent Secondary Dressing Wound #1 Right,Proximal,Lateral Lower Leg o ABD pad o XtraSorb Wound #2 Right,Distal,Lateral Lower Leg o ABD pad o XtraSorb Dressing Change Frequency Wound #1 Right,Proximal,Lateral Lower Leg o Change dressing every week Wound #2 Right,Distal,Lateral Lower Leg o Change dressing every week Follow-up Appointments Wound #1 Right,Proximal,Lateral Lower Leg o Return Appointment in 1 week. [name], [name] ([id number]) Wound #2 Right,Distal,Lateral Lower Leg o Return Appointment in 1 week. Edema Control Wound #1 Right,Proximal,Lateral Lower Leg o 3 Layer Compression System - Right Lower Extremity Wound #2 Right,Distal,Lateral Lower Leg o 3 Layer Compression System - Right Lower Extremity Electronic  Signature(s) Signed: 09/10/2017 12:49:35 PM By: Evlyn KannerBritto, Roselin Wiemann MD, FACS Signed: 09/10/2017 4:16:02 PM By: Curtis Sitesorthy, Joanna Entered By: Curtis Sitesorthy, Joanna on 09/10/2017 09:16:13 [name], [name] ([id number]) -------------------------------------------------------------------------------- Problem List Details Patient Name: Jessica CaseyLEATH, Zissel R. Date of Service: 09/10/2017 8:00 AM Medical Record Number: [id number] Patient Account Number: [id number] Date of Birth/Sex: 1981/06/13 (36 y.o. Female) Treating RN: Curtis Sitesorthy, Joanna Primary Care Provider: PATIENT, NO Other Clinician: Referring Provider: Referral, Self Treating Provider/Extender: Rudene ReBritto, Ashland Wiseman Weeks in Treatment: 0 Active Problems ICD-10 Encounter Code Description Active Date Diagnosis L97.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d 09/10/2017 Yes I89.0 Lymphedema, not elsewhere classified 09/10/2017 Yes I87.311 Chronic venous hypertension (idiopathic) with ulcer of right lower 09/10/2017 Yes extremity E66.01 Morbid (severe) obesity due to excess calories 09/10/2017 Yes Inactive Problems Resolved Problems Electronic Signature(s) Signed: 09/10/2017 9:17:58 AM By: Evlyn KannerBritto, Serayah Yazdani MD, FACS Entered By: Evlyn KannerBritto, Rhiley Solem  on 09/10/2017 09:17:58 [name], [name] ([id number]) -------------------------------------------------------------------------------- Progress Note Details Patient Name: TONNA, PALAZZI. Date of Service: 09/10/2017 8:00 AM Medical Record Number: [id number] Patient Account Number: [id number] Date of Birth/Sex: 10-24-1981 (36 y.o. Female) Treating RN: Curtis Sites Primary Care Provider: PATIENT, NO Other Clinician: Referring Provider: Referral, Self Treating Provider/Extender: Rudene Re in Treatment: 0 Subjective Chief Complaint Information obtained from Patient Patient presents for treatment of an open ulcer due to venous insufficiency to the right lower extremity which she's had for about a  month History of Present Illness (HPI) The following HPI elements were documented for the patient's wound: Location: right lower extremity lateral calf several large areas Quality: Patient reports experiencing a sharp pain to affected area(s). Severity: Patient states wound are getting worse. Duration: Patient has had the wound for < 5 weeks prior to presenting for treatment Timing: Pain in wound is Intermittent (comes and goes Context: The wound would happen gradually Modifying Factors: Other treatment(s) tried include:admission to the hospital for sepsis and a full workup with treatment with IV antibiotics and discharged on oral antibiotics Associated Signs and Symptoms: Patient reports having increase discharge. 36 year old patient well known to our [facility] wound care clinic where she has been seen since 2016 for bilateral lower extremity venous insufficiency disease with lymphedema and multiple ulcerations associated with morbid obesity. she had custom-made compression stockings and lymphedema pumps which were used in the past. most recently she was admitted to the hospital between October 11 and 09/02/2017 with sepsis, lower extremity wounds and lymphedema.she was initially treated in the outpatient with Keflex and Bactrim. she was initially treated in the hospital with vancomycin and Zosyn and changed over to Unasyn until her white count improved and her blood cultures were negative for 3 days. After her inpatient management she was discharged home on Augmentin to end on 09/13/2017 with a 14 day course. she has had outpatient vascular duplex scans completed in November 2017 and her right ABI was 1.1 and the left ABI is 1.3. she had normal toe brachial indices bilaterally.she had three-vessel runoff in the right lower extremity and two-vessel runoff in the left lower extremity. on questioning the patient she does have custom made compression stockings and also has a lymphedema pump but  has not been using it appropriately and has not been taking good care of herself Wound History Patient presents with 1 open wound that has been present for approximately 1 month. Patient has been treating wound in the following manner: aquacel ag. Laboratory tests have not been performed in the last month. Patient reportedly has not tested positive for an antibiotic resistant organism. Patient reportedly has not tested positive for osteomyelitis. Patient reportedly has had testing performed to evaluate circulation in the legs. Patient History Information obtained from Patient. Allergies No Known Allergies Likes, Reata R. ([id number]) Family History Kidney Disease - Mother, No family history of Cancer, Diabetes, Heart Disease, Hereditary Spherocytosis, Hypertension, Lung Disease, Seizures, Stroke, Thyroid Problems, Tuberculosis. Social History Never smoker, Marital Status - Married, Alcohol Use - Never, Drug Use - No History, Caffeine Use - Daily. Medical History Hematologic/Lymphatic Patient has history of Lymphedema Respiratory Denies history of Aspiration, Asthma, Chronic Obstructive Pulmonary Disease (COPD), Pneumothorax, Sleep Apnea, Tuberculosis Cardiovascular Patient has history of Hypertension Denies history of Angina, Arrhythmia, Congestive Heart Failure, Coronary Artery Disease, Deep Vein Thrombosis, Hypotension, Myocardial Infarction, Peripheral Arterial Disease, Peripheral Venous Disease, Phlebitis, Vasculitis Review of Systems (ROS) Constitutional Symptoms (General Health) The patient has no complaints or symptoms. Eyes  The patient has no complaints or symptoms. Ear/Nose/Mouth/Throat The patient has no complaints or symptoms. Hematologic/Lymphatic The patient has no complaints or symptoms. Respiratory The patient has no complaints or symptoms. Cardiovascular The patient has no complaints or symptoms. Gastrointestinal The patient has no complaints or  symptoms. Endocrine The patient has no complaints or symptoms. Genitourinary The patient has no complaints or symptoms. Immunological The patient has no complaints or symptoms. Integumentary (Skin) The patient has no complaints or symptoms. Musculoskeletal The patient has no complaints or symptoms. Neurologic The patient has no complaints or symptoms. Oncologic The patient has no complaints or symptoms. Psychiatric The patient has no complaints or symptoms. Medications Augmentin oral 1 1 tablet oral two times daily until all are taken [name], [name] ([id number]) Objective Constitutional Pulse regular. Respirations normal and unlabored. Afebrile. Vitals Time Taken: 8:24 AM, Height: 74 in, Source: Measured, Weight: 505 lbs, Source: Measured, BMI: 64.8, Temperature: 98.3 F, Pulse: 89 bpm, Respiratory Rate: 18 breaths/min, Blood Pressure: 138/98 mmHg. Eyes Nonicteric. Reactive to light. Ears, Nose, Mouth, and Throat Lips, teeth, and gums WNL.Marland Kitchen Moist mucosa without lesions. Neck supple and nontender. No palpable supraclavicular or cervical adenopathy. Normal sized without goiter. Respiratory WNL. No retractions.. Cardiovascular Pedal Pulses WNL. ABI could not be measured. No clubbing, cyanosis or edema. Chest Breasts symmetical and no nipple discharge.. Breast tissue WNL, no masses, lumps, or tenderness.. Gastrointestinal (GI) Abdomen without masses or tenderness.. No liver or spleen enlargement or tenderness.. Lymphatic No adneopathy. No adenopathy. No adenopathy. Musculoskeletal Adexa without tenderness or enlargement.. Digits and nails w/o clubbing, cyanosis, infection, petechiae, ischemia, or inflammatory conditions.Marland Kitchen Psychiatric Judgement and insight Intact.. No evidence of depression, anxiety, or agitation.. General Notes: she has massive stage III lymphedema bilaterally with large ulcerations on the right lateral calf with some slough and no sharp debridement was  done today because of significant tenderness. Integumentary (Hair, Skin) No suspicious lesions. No crepitus or fluctuance. No peri-wound warmth or erythema. No masses.. Wound #1 status is Open. Original cause of wound was Gradually Appeared. The wound is located on the Right,Proximal,Lateral Lower Leg. The wound measures 1.2cm length x 1.6cm width x 0.2cm depth; 1.508cm^2 area and 0.302cm^3 volume. There is Fat Layer (Subcutaneous Tissue) Exposed exposed. There is no tunneling or undermining noted. There is a large amount of serous drainage noted. The wound margin is flat and intact. There is large (67-100%) red granulation within the wound bed. There is a small (1-33%) amount of necrotic tissue within the wound bed including Adherent Slough. The periwound skin appearance did not exhibit: Callus, Crepitus, Excoriation, Induration, Rash, Scarring, Dry/Scaly, Maceration, Atrophie Blanche, Cyanosis, Ecchymosis, Hemosiderin Staining, Mottled, Pallor, Rubor, Erythema. Periwound temperature was noted as No Abnormality. [name], [name] ([id number]) Wound #2 status is Open. Original cause of wound was Gradually Appeared. The wound is located on the Right,Distal,Lateral Lower Leg. The wound measures 3.9cm length x 7cm width x 0.3cm depth; 21.441cm^2 area and 6.432cm^3 volume. There is Fat Layer (Subcutaneous Tissue) Exposed exposed. There is no tunneling or undermining noted. There is a large amount of serous drainage noted. The wound margin is flat and intact. There is large (67-100%) red granulation within the wound bed. There is a small (1-33%) amount of necrotic tissue within the wound bed including Adherent Slough. The periwound skin appearance did not exhibit: Callus, Crepitus, Excoriation, Induration, Rash, Scarring, Dry/Scaly, Maceration, Atrophie Blanche, Cyanosis, Ecchymosis, Hemosiderin Staining, Mottled, Pallor, Rubor, Erythema. Periwound temperature was noted as No  Abnormality. Assessment Active Problems ICD-10 L97.212 -  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d I89.0 - Lymphedema, not elsewhere classified I87.311 - Chronic venous hypertension (idiopathic) with ulcer of right lower extremity E66.01 - Morbid (severe) obesity due to excess calories morbidly obese 36 year old female who is known to have chronic lymphedema with appropriate management in the past at our [facility] wound center. She has had significant sepsis with inflammation and was recently admitted to the hospital and treated appropriately. The patient has not been following her compression routine and lymphedema pumps as advised. After review today I have recommended: 1. Silver alginate with a 3 layer compression wrap to the right lower extremity 2. Use her custom built compression stockings of the left lower extremity 3. Lymphedema pumps to be utilized twice a day for an hour each 4. Elevation and exercises been discussed with her in great detail 5. Adequate protein, vitamin A, vitamin C and zinc 6. Regular visits to the wound center Procedures Wound #1 Pre-procedure diagnosis of Wound #1 is a Lymphedema located on the Right,Proximal,Lateral Lower Leg . There was a Three Layer Compression Therapy Procedure by Curtis Sites, RN. Post procedure Diagnosis Wound #1: Same as Pre-Procedure Notes: Patient has been seen by [facility] and the [facility] and has tolerated wraps in the past per Dr Meyer Russel it is alright to wrap her for her lymphedema. Wound #2 Pre-procedure diagnosis of Wound #2 is a Lymphedema located on the Right,Distal,Lateral Lower Leg . There was a Three Layer Compression Therapy Procedure by Curtis Sites, RN. Post procedure Diagnosis Wound #2: Same as Pre-Procedure Notes: Patient has been seen by [facility] and the [facility] and has tolerated wraps in the past per Dr Meyer Russel it is alright to wrap her for her lymphedema. [name], [name]  ([id number]) Plan Wound Cleansing: Wound #1 Right,Proximal,Lateral Lower Leg: Clean wound with Normal Saline. May shower with protection. Wound #2 Right,Distal,Lateral Lower Leg: Clean wound with Normal Saline. May shower with protection. Anesthetic: Wound #1 Right,Proximal,Lateral Lower Leg: Topical Lidocaine 4% cream applied to wound bed prior to debridement Wound #2 Right,Distal,Lateral Lower Leg: Topical Lidocaine 4% cream applied to wound bed prior to debridement Primary Wound Dressing: Wound #1 Right,Proximal,Lateral Lower Leg: Silvercel Non-Adherent Wound #2 Right,Distal,Lateral Lower Leg: Silvercel Non-Adherent Secondary Dressing: Wound #1 Right,Proximal,Lateral Lower Leg: ABD pad XtraSorb Wound #2 Right,Distal,Lateral Lower Leg: ABD pad XtraSorb Dressing Change Frequency: Wound #1 Right,Proximal,Lateral Lower Leg: Change dressing every week Wound #2 Right,Distal,Lateral Lower Leg: Change dressing every week Follow-up Appointments: Wound #1 Right,Proximal,Lateral Lower Leg: Return Appointment in 1 week. Wound #2 Right,Distal,Lateral Lower Leg: Return Appointment in 1 week. Edema Control: Wound #1 Right,Proximal,Lateral Lower Leg: 3 Layer Compression System - Right Lower Extremity Wound #2 Right,Distal,Lateral Lower Leg: 3 Layer Compression System - Right Lower Extremity morbidly obese 36 year old female who is known to have chronic lymphedema with appropriate management in the past at our [facility] wound center. She has had significant sepsis with inflammation and was recently admitted to the hospital and treated appropriately. The patient has not been following her compression routine and lymphedema pumps as advised. After review today I have recommended: 1. Silver alginate with a 3 layer compression wrap to the right lower extremity 2. Use her custom built compression stockings of the left lower extremity [name], [name] ([id number]) 3. Lymphedema pumps to be  utilized twice a day for an hour each 4. Elevation and exercises been discussed with her in great detail 5. Adequate protein, vitamin A, vitamin C and zinc 6. Regular visits to the  wound center Electronic Signature(s) Signed: 09/10/2017 12:48:18 PM By: Evlyn Kanner MD, FACS Previous Signature: 09/10/2017 9:22:38 AM Version By: Evlyn Kanner MD, FACS Entered By: Evlyn Kanner on 09/10/2017 12:48:18 [name] ([id number]) -------------------------------------------------------------------------------- ROS/PFSH Details Patient Name: Jessica Casey R. Date of Service: 09/10/2017 8:00 AM Medical Record Number: [id number] Patient Account Number: [id number] Date of Birth/Sex: 12-Nov-1981 (36 y.o. Female) Treating RN: Curtis Sites Primary Care Provider: PATIENT, NO Other Clinician: Referring Provider: Referral, Self Treating Provider/Extender: Rudene Re in Treatment: 0 Information Obtained From Patient Wound History Do you currently have one or more open woundso Yes How many open wounds do you currently haveo 1 Approximately how long have you had your woundso 1 month How have you been treating your wound(s) until nowo aquacel ag Has your wound(s) ever healed and then re-openedo No Have you had any lab work done in the past montho No Have you tested positive for an antibiotic resistant organism (MRSA, VRE)o No Have you tested positive for osteomyelitis (bone infection)o No Have you had any tests for circulation on your legso Yes Who ordered the testo [facility] Where was the test doneo [facility] Constitutional Symptoms (General Health) Complaints and Symptoms: No Complaints or Symptoms Eyes Complaints and Symptoms: No Complaints or Symptoms Ear/Nose/Mouth/Throat Complaints and Symptoms: No Complaints or Symptoms Hematologic/Lymphatic Complaints and Symptoms: No Complaints or Symptoms Medical History: Positive for: Lymphedema Respiratory Complaints and Symptoms: No  Complaints or Symptoms Medical History: Negative for: Aspiration; Asthma; Chronic Obstructive Pulmonary Disease (COPD); Pneumothorax; Sleep Apnea; Tuberculosis Cardiovascular [name], [name] ([id number]) Complaints and Symptoms: No Complaints or Symptoms Medical History: Positive for: Hypertension Negative for: Angina; Arrhythmia; Congestive Heart Failure; Coronary Artery Disease; Deep Vein Thrombosis; Hypotension; Myocardial Infarction; Peripheral Arterial Disease; Peripheral Venous Disease; Phlebitis; Vasculitis Gastrointestinal Complaints and Symptoms: No Complaints or Symptoms Endocrine Complaints and Symptoms: No Complaints or Symptoms Genitourinary Complaints and Symptoms: No Complaints or Symptoms Immunological Complaints and Symptoms: No Complaints or Symptoms Integumentary (Skin) Complaints and Symptoms: No Complaints or Symptoms Musculoskeletal Complaints and Symptoms: No Complaints or Symptoms Neurologic Complaints and Symptoms: No Complaints or Symptoms Oncologic Complaints and Symptoms: No Complaints or Symptoms Psychiatric Complaints and Symptoms: No Complaints or Symptoms Immunizations Pneumococcal Vaccine: Received Pneumococcal Vaccination: No Immunization Notes: up to date [name], [name] ([id number]) Implantable Devices Family and Social History Cancer: No; Diabetes: No; Heart Disease: No; Hereditary Spherocytosis: No; Hypertension: No; Kidney Disease: Yes - Mother; Lung Disease: No; Seizures: No; Stroke: No; Thyroid Problems: No; Tuberculosis: No; Never smoker; Marital Status - Married; Alcohol Use: Never; Drug Use: No History; Caffeine Use: Daily; Financial Concerns: No; Food, Clothing or Shelter Needs: No; Support System Lacking: No; Transportation Concerns: No; Advanced Directives: No; Patient does not want information on Advanced Directives Physician Affirmation I have reviewed and agree with the above information. Electronic  Signature(s) Signed: 09/10/2017 12:49:35 PM By: Evlyn Kanner MD, FACS Signed: 09/10/2017 4:16:02 PM By: Curtis Sites Entered By: Evlyn Kanner on 09/10/2017 09:00:32 [name] ([id number]) -------------------------------------------------------------------------------- SuperBill Details Patient Name: Jessica Casey R. Date of Service: 09/10/2017 Medical Record Number: [id number] Patient Account Number: [id number] Date of Birth/Sex: [birth year] (36 y.o. Female) Treating RN: Curtis Sites Primary Care Provider: PATIENT, NO Other Clinician: Referring Provider: Referral, Self Treating Provider/Extender: Rudene Re in Treatment: 0 Diagnosis Coding ICD-10 Codes Code Description [phone] 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d I89.0 Lymphedema, not elsewhere classified I87.311 Chronic venous hypertension (idiopathic) with ulcer of right lower extremity E66.01 Morbid (severe) obesity due to  excess calories Facility Procedures CPT4 Code Description: [id number] 99213 - WOUND CARE VISIT-LEV 3 EST PT Modifier: Quantity: 1 CPT4 Code Description: [id number] (Facility Use Only) [phone] - APPLY MULTLAY COMPRS LWR RT LEG Modifier: Quantity: 1 Physician Procedures CPT4 Code: [id number] Description: 99214 - WC PHYS LEVEL 4 - EST PT ICD-10 Diagnosis Description L97.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 I89.0 Lymphedema, not elsewhere classified I87.311 Chronic venous hypertension (idiopathic) with ulcer of  right l E66.01 Morbid (severe) obesity due to excess calories Modifier: d ower extremity Quantity: 1 Electronic Signature(s) Signed: 09/10/2017 4:15:14 PM By: Curtis Sites Previous Signature: 09/10/2017 4:14:43 PM Version By: Curtis Sites Previous Signature: 09/10/2017 11:42:17 AM Version By: Curtis Sites Previous Signature: 09/10/2017 12:49:35 PM Version By: Evlyn Kanner MD, FACS Previous Signature: 09/10/2017 9:22:56 AM  Version By: Evlyn Kanner MD, FACS Entered By: Curtis Sites on 09/10/2017 16:15:14

## 2017-09-12 ENCOUNTER — Inpatient Hospital Stay: Payer: Self-pay

## 2017-09-13 NOTE — Progress Notes (Signed)
MARGA, GRAMAJO ([id number]) Visit Report for 09/10/2017 Allergy List Details Patient Name: [name], [name] Date of Service: 09/10/2017 8:00 AM Medical Record Number: [id number] Patient Account Number: [id number] Date of Birth/Sex: 09/18/1981 (36 y.o. Female) Treating RN: Curtis Sites Primary Care Katye Valek: PATIENT, NO Other Clinician: Referring Krystopher Kuenzel: Referral, Self Treating Damico Partin/Extender: Rudene Re in Treatment: 0 Allergies Active Allergies No Known Allergies Allergy Notes Electronic Signature(s) Signed: 09/10/2017 4:16:02 PM By: Curtis Sites Entered By: Curtis Sites on 09/10/2017 08:21:19 Leverich, Cristal Deer ([id number]) -------------------------------------------------------------------------------- Arrival Information Details Patient Name: Jessica Casey R. Date of Service: 09/10/2017 8:00 AM Medical Record Number: [id number] Patient Account Number: [id number] Date of Birth/Sex: 1981-10-13 (36 y.o. Female) Treating RN: Curtis Sites Primary Care Anuj Summons: PATIENT, NO Other Clinician: Referring Lashawnna Lambrecht: Referral, Self Treating Latice Waitman/Extender: Rudene Re in Treatment: 0 Visit Information Patient Arrived: Ambulatory Arrival Time: 08:18 Accompanied By: friend Transfer Assistance: None Patient Identification Verified: Yes Secondary Verification Process Completed: Yes Electronic Signature(s) Signed: 09/10/2017 4:16:02 PM By: Curtis Sites Entered By: Curtis Sites on 09/10/2017 08:20:21 Heitz, Cristal Deer ([id number]) -------------------------------------------------------------------------------- Clinic Level of Care Assessment Details Patient Name: Jessica Hind. Date of Service: 09/10/2017 8:00 AM Medical Record Number: [id number] Patient Account Number: [id number] Date of Birth/Sex: 09/27/1981 (36 y.o. Female) Treating RN: Curtis Sites Primary Care Lorayne Getchell: PATIENT, NO Other Clinician: Referring Takiesha Mcdevitt: Referral, Self Treating  Laiden Milles/Extender: Rudene Re in Treatment: 0 Clinic Level of Care Assessment Items TOOL 1 Quantity Score []  - Use when EandM and Procedure is performed on INITIAL visit 0 ASSESSMENTS - Nursing Assessment / Reassessment X - General Physical Exam (combine w/ comprehensive assessment (listed just below) when 1 20 performed on new pt. evals) X- 1 25 Comprehensive Assessment (HX, ROS, Risk Assessments, Wounds Hx, etc.) ASSESSMENTS - Wound and Skin Assessment / Reassessment []  - Dermatologic / Skin Assessment (not related to wound area) 0 ASSESSMENTS - Ostomy and/or Continence Assessment and Care []  - Incontinence Assessment and Management 0 []  - 0 Ostomy Care Assessment and Management (repouching, etc.) PROCESS - Coordination of Care X - Simple Patient / Family Education for ongoing care 1 15 []  - 0 Complex (extensive) Patient / Family Education for ongoing care X- 1 10 Staff obtains Chiropractor, Records, Test Results / Process Orders []  - 0 Staff telephones HHA, Nursing Homes / Clarify orders / etc []  - 0 Routine Transfer to another Facility (non-emergent condition) []  - 0 Routine Hospital Admission (non-emergent condition) X- 1 15 New Admissions / Manufacturing engineer / Ordering NPWT, Apligraf, etc. []  - 0 Emergency Hospital Admission (emergent condition) PROCESS - Special Needs []  - Pediatric / Minor Patient Management 0 []  - 0 Isolation Patient Management []  - 0 Hearing / Language / Visual special needs []  - 0 Assessment of Community assistance (transportation, D/C planning, etc.) []  - 0 Additional assistance / Altered mentation []  - 0 Support Surface(s) Assessment (bed, cushion, seat, etc.) [name], [name] ([id number]) INTERVENTIONS - Miscellaneous []  - External ear exam 0 []  - 0 Patient Transfer (multiple staff / Nurse, adult / Similar devices) []  - 0 Simple Staple / Suture removal (25 or less) []  - 0 Complex Staple / Suture removal (26 or more) []  -  0 Hypo/Hyperglycemic Management (do not check if billed separately) []  - 0 Ankle / Brachial Index (ABI) - do not check if billed separately Has the patient been seen at the hospital within the last three years: Yes Total Score: 85 Level Of Care: New/Established - Level 3 Electronic Signature(s) Signed: 09/10/2017  4:16:02 PM By: Curtis Sites Entered By: Curtis Sites on 09/10/2017 11:42:07 Goettel, Cristal Deer ([id number]) -------------------------------------------------------------------------------- Compression Therapy Details Patient Name: Jessica Casey R. Date of Service: 09/10/2017 8:00 AM Medical Record Number: [id number] Patient Account Number: [id number] Date of Birth/Sex: 1981/03/06 (36 y.o. Female) Treating RN: Curtis Sites Primary Care Marrian Bells: PATIENT, NO Other Clinician: Referring Lorn Butcher: Referral, Self Treating Anjannette Gauger/Extender: Rudene Re in Treatment: 0 Compression Therapy Performed for Wound Assessment: Wound #2 Right,Distal,Lateral Lower Leg Performed By: Clinician Curtis Sites, RN Compression Type: Three Layer Post Procedure Diagnosis Same as Pre-procedure Notes Patient has been seen by [facility] and the [facility] and has tolerated wraps in the past per Dr Meyer Russel it is alright to wrap her for her lymphedema Electronic Signature(s) Signed: 09/10/2017 11:41:42 AM By: Curtis Sites Entered By: Curtis Sites on 09/10/2017 11:41:42 Rinkenberger, Khrista R. ([id number]) -------------------------------------------------------------------------------- Compression Therapy Details Patient Name: Jessica Casey R. Date of Service: 09/10/2017 8:00 AM Medical Record Number: [id number] Patient Account Number: [id number] Date of Birth/Sex: [birth year] (36 y.o. Female) Treating RN: Curtis Sites Primary Care Dana Debo: PATIENT, NO Other Clinician: Referring Huber Mathers: Referral, Self Treating Reva Pinkley/Extender: Rudene Re in Treatment: 0 Compression Therapy  Performed for Wound Assessment: Wound #1 Right,Proximal,Lateral Lower Leg Performed By: Clinician Curtis Sites, RN Compression Type: Three Layer Post Procedure Diagnosis Same as Pre-procedure Notes Patient has been seen by [facility] and the [facility] and has tolerated wraps in the past per Dr Meyer Russel it is alright to wrap her for her lymphedema Electronic Signature(s) Signed: 09/10/2017 11:41:42 AM By: Curtis Sites Entered By: Curtis Sites on 09/10/2017 11:41:42 Crossley, Alvera R. ([id number]) -------------------------------------------------------------------------------- Encounter Discharge Information Details Patient Name: Jessica Casey R. Date of Service: 09/10/2017 8:00 AM Medical Record Number: [id number] Patient Account Number: [id number] Date of Birth/Sex: [birth year] (36 y.o. Female) Treating RN: Curtis Sites Primary Care Deryck Hippler: PATIENT, NO Other Clinician: Referring Artavius Stearns: Referral, Self Treating Doyne Ellinger/Extender: Rudene Re in Treatment: 0 Encounter Discharge Information Items Discharge Pain Level: 0 Discharge Condition: Stable Ambulatory Status: Ambulatory Discharge Destination: Home Transportation: Private Auto Accompanied By: friend Schedule Follow-up Appointment: Yes Medication Reconciliation completed and No provided to Patient/Care Ranier Coach: Patient Clinical Summary of Care: Declined Electronic Signature(s) Signed: 09/11/2017 12:59:36 PM By: Gwenlyn Perking Entered By: Gwenlyn Perking on 09/10/2017 09:16:28 Jessica Hind ([id number]) -------------------------------------------------------------------------------- Lower Extremity Assessment Details Patient Name: Jessica Casey R. Date of Service: 09/10/2017 8:00 AM Medical Record Number: [id number] Patient Account Number: [id number] Date of Birth/Sex: [birth year] (36 y.o. Female) Treating RN: Curtis Sites Primary Care Ceilidh Torregrossa: PATIENT, NO Other Clinician: Referring Alicya Bena: Referral,  Self Treating Kasha Howeth/Extender: Rudene Re in Treatment: 0 Edema Assessment Assessed: [Left: No] [Right: No] Edema: [Left: Yes] [Right: Yes] Calf Left: Right: Point of Measurement: 35 cm From Medial Instep cm 68.3 cm Ankle Left: Right: Point of Measurement: 9 cm From Medial Instep cm 40.3 cm Vascular Assessment Pulses: Dorsalis Pedis Palpable: [Right:Yes] Doppler Audible: [Right:Yes] Posterior Tibial Palpable: [Right:No] Doppler Audible: [Right:Yes] Extremity colors, hair growth, and conditions: Extremity Color: [Right:Hyperpigmented] Hair Growth on Extremity: [Right:No] Temperature of Extremity: [Right:Warm] Capillary Refill: [Right:< 3 seconds] Toe Nail Assessment Left: Right: Thick: Yes Discolored: Yes Deformed: Yes Improper Length and Hygiene: Yes Electronic Signature(s) Signed: 09/10/2017 4:16:02 PM By: Curtis Sites Entered By: Curtis Sites on 09/10/2017 08:44:45 Towers, Lezette R. ([id number]) -------------------------------------------------------------------------------- Multi Wound Chart Details Patient Name: Jessica Casey R. Date of Service: 09/10/2017 8:00 AM Medical Record Number: [id number] Patient Account Number: [id number] Date of Birth/Sex: February 07, 1981 (36 y.o. Female) Treating RN: Curtis Sites Primary  Care Josephine Rudnick: PATIENT, NO Other Clinician: Referring Srihari Shellhammer: Referral, Self Treating Jacion Dismore/Extender: Rudene Re in Treatment: 0 Vital Signs Height(in): 74 Pulse(bpm): 89 Weight(lbs): 505 Blood Pressure(mmHg): 138/98 Body Mass Index(BMI): 65 Temperature(F): 98.3 Respiratory Rate 18 (breaths/min): Photos: [1:No Photos] [2:No Photos] [N/A:N/A] Wound Location: [1:Right Lower Leg - Lateral, Proximal] [2:Right Lower Leg - Lateral, Distal] [N/A:N/A] Wounding Event: [1:Gradually Appeared] [2:Gradually Appeared] [N/A:N/A] Primary Etiology: [1:Lymphedema] [2:Lymphedema] [N/A:N/A] Comorbid History: [1:Lymphedema, Hypertension]  [2:Lymphedema, Hypertension] [N/A:N/A] Date Acquired: [1:08/08/2017] [2:08/08/2017] [N/A:N/A] Weeks of Treatment: [1:0] [2:0] [N/A:N/A] Wound Status: [1:Open] [2:Open] [N/A:N/A] Measurements L x W x D [1:1.2x1.6x0.2] [2:3.9x7x0.3] [N/A:N/A] (cm) Area (cm) : [1:1.508] [2:21.441] [N/A:N/A] Volume (cm) : [1:0.302] [2:6.432] [N/A:N/A] Classification: [1:Full Thickness With Exposed Support Structures] [2:Full Thickness With Exposed Support Structures] [N/A:N/A] Exudate Amount: [1:Large] [2:Large] [N/A:N/A] Exudate Type: [1:Serous] [2:Serous] [N/A:N/A] Exudate Color: [1:amber] [2:amber] [N/A:N/A] Wound Margin: [1:Flat and Intact] [2:Flat and Intact] [N/A:N/A] Granulation Amount: [1:Large (67-100%)] [2:Large (67-100%)] [N/A:N/A] Granulation Quality: [1:Red] [2:Red] [N/A:N/A] Necrotic Amount: [1:Small (1-33%)] [2:Small (1-33%)] [N/A:N/A] Exposed Structures: [1:Fat Layer (Subcutaneous Tissue) Exposed: Yes Fascia: No Tendon: No Muscle: No Joint: No Bone: No] [2:Fat Layer (Subcutaneous Tissue) Exposed: Yes Fascia: No Tendon: No Muscle: No Joint: No Bone: No] [N/A:N/A] Epithelialization: [1:None] [2:None] [N/A:N/A] Periwound Skin Texture: [1:Excoriation: No Induration: No Callus: No Crepitus: No Rash: No Scarring: No] [2:Excoriation: No Induration: No Callus: No Crepitus: No Rash: No Scarring: No] [N/A:N/A] Periwound Skin Moisture: [N/A:N/A] Maceration: No Maceration: No Dry/Scaly: No Dry/Scaly: No Periwound Skin Color: Atrophie Blanche: No Atrophie Blanche: No N/A Cyanosis: No Cyanosis: No Ecchymosis: No Ecchymosis: No Erythema: No Erythema: No Hemosiderin Staining: No Hemosiderin Staining: No Mottled: No Mottled: No Pallor: No Pallor: No Rubor: No Rubor: No Temperature: No Abnormality No Abnormality N/A Tenderness on Palpation: No No N/A Wound Preparation: Ulcer Cleansing: Ulcer Cleansing: N/A Rinsed/Irrigated with Saline Rinsed/Irrigated with Saline Topical Anesthetic  Applied: Topical Anesthetic Applied: Other: lidocaine 4% Other: lidocaine 4% Treatment Notes Electronic Signature(s) Signed: 09/10/2017 9:18:04 AM By: Evlyn Kanner MD, FACS Entered By: Evlyn Kanner on 09/10/2017 09:18:03 Jessica Hind ([id number]) -------------------------------------------------------------------------------- Multi-Disciplinary Care Plan Details Patient Name: Jessica Casey R. Date of Service: 09/10/2017 8:00 AM Medical Record Number: [id number] Patient Account Number: [id number] Date of Birth/Sex: 1980-12-18 (36 y.o. Female) Treating RN: Curtis Sites Primary Care Armoni Kludt: PATIENT, NO Other Clinician: Referring Shamar Kracke: Referral, Self Treating Miryah Ralls/Extender: Rudene Re in Treatment: 0 Active Inactive ` Orientation to the Wound Care Program Nursing Diagnoses: Knowledge deficit related to the wound healing center program Goals: Patient/caregiver will verbalize understanding of the Wound Healing Center Program Date Initiated: 09/10/2017 Target Resolution Date: 11/23/2017 Goal Status: Active Interventions: Provide education on orientation to the wound center Notes: ` Venous Leg Ulcer Nursing Diagnoses: Knowledge deficit related to disease process and management Goals: Patient/caregiver will verbalize understanding of disease process and disease management Date Initiated: 09/10/2017 Target Resolution Date: 11/23/2017 Goal Status: Active Interventions: Assess peripheral edema status every visit. Notes: ` Wound/Skin Impairment Nursing Diagnoses: Impaired tissue integrity Goals: Ulcer/skin breakdown will heal within 14 weeks Date Initiated: 09/10/2017 Target Resolution Date: 11/24/2017 Goal Status: Active Interventions: EMMERY, SEILER ([id number]) Assess patient/caregiver ability to obtain necessary supplies Assess patient/caregiver ability to perform ulcer/skin care regimen upon admission and as needed Assess ulceration(s) every  visit Notes: Electronic Signature(s) Signed: 09/10/2017 4:16:02 PM By: Curtis Sites Entered By: Curtis Sites on 09/10/2017 08:54:51 Horen, Catrice R. ([id number]) -------------------------------------------------------------------------------- Pain Assessment Details Patient Name: Jessica Casey R. Date of Service: 09/10/2017 8:00 AM Medical Record Number: [id number]  Patient Account Number: [id number] Date of Birth/Sex: 1981-02-19 (36 y.o. Female) Treating RN: Curtis Sites Primary Care Jemiah Ellenburg: PATIENT, NO Other Clinician: Referring Sadarius Norman: Referral, Self Treating Lulia Schriner/Extender: Rudene Re in Treatment: 0 Active Problems Location of Pain Severity and Description of Pain Patient Has Paino No Site Locations Pain Management and Medication Current Pain Management: Notes Topical or injectable lidocaine is offered to patient for acute pain when surgical debridement is performed. If needed, Patient is instructed to use over the counter pain medication for the following 24-48 hours after debridement. Wound care MDs do not prescribed pain medications. Patient has chronic pain or uncontrolled pain. Patient has been instructed to make an appointment with their Primary Care Physician for pain management. Electronic Signature(s) Signed: 09/10/2017 4:16:02 PM By: Curtis Sites Entered By: Curtis Sites on 09/10/2017 08:20:43 Jessica Hind ([id number]) -------------------------------------------------------------------------------- Patient/Caregiver Education Details Patient Name: Jessica Casey R. Date of Service: 09/10/2017 8:00 AM Medical Record Number: [id number] Patient Account Number: [id number] Date of Birth/Gender: 10-Aug-1981 (36 y.o. Female) Treating RN: Curtis Sites Primary Care Physician: PATIENT, NO Other Clinician: Referring Physician: Referral, Self Treating Physician/Extender: Rudene Re in Treatment: 0 Education Assessment Education Provided  To: Patient Education Topics Provided Venous: Handouts: Other: must wear compression daily and use lymph pumps Methods: Explain/Verbal Responses: State content correctly Electronic Signature(s) Signed: 09/10/2017 4:16:02 PM By: Curtis Sites Entered By: Curtis Sites on 09/10/2017 08:56:07 Cala, Desteny R. ([id number]) -------------------------------------------------------------------------------- Wound Assessment Details Patient Name: Jessica Casey R. Date of Service: 09/10/2017 8:00 AM Medical Record Number: [id number] Patient Account Number: [id number] Date of Birth/Sex: [birth year] (36 y.o. Female) Treating RN: Curtis Sites Primary Care Marcile Fuquay: PATIENT, NO Other Clinician: Referring Annabella Elford: Referral, Self Treating Wayland Baik/Extender: Rudene Re in Treatment: 0 Wound Status Wound Number: 1 Primary Etiology: Lymphedema Wound Location: Right Lower Leg - Lateral, Proximal Wound Status: Open Wounding Event: Gradually Appeared Comorbid History: Lymphedema, Hypertension Date Acquired: 08/08/2017 Weeks Of Treatment: 0 Clustered Wound: No Photos Photo Uploaded By: Curtis Sites on 09/10/2017 14:37:35 Wound Measurements Length: (cm) 1.2 Width: (cm) 1.6 Depth: (cm) 0.2 Area: (cm) 1.508 Volume: (cm) 0.302 % Reduction in Area: % Reduction in Volume: Epithelialization: None Tunneling: No Undermining: No Wound Description Full Thickness With Exposed Support Classification: Structures Wound Margin: Flat and Intact Exudate Large Amount: Exudate Type: Serous Exudate Color: amber Foul Odor After Cleansing: No Slough/Fibrino Yes Wound Bed Granulation Amount: Large (67-100%) Exposed Structure Granulation Quality: Red Fascia Exposed: No Necrotic Amount: Small (1-33%) Fat Layer (Subcutaneous Tissue) Exposed: Yes Necrotic Quality: Adherent Slough Tendon Exposed: No Muscle Exposed: No Joint Exposed: No Bone Exposed: No Cuellar, Cahterine R.  ([id number]) Periwound Skin Texture Texture Color No Abnormalities Noted: No No Abnormalities Noted: No Callus: No Atrophie Blanche: No Crepitus: No Cyanosis: No Excoriation: No Ecchymosis: No Induration: No Erythema: No Rash: No Hemosiderin Staining: No Scarring: No Mottled: No Pallor: No Moisture Rubor: No No Abnormalities Noted: No Dry / Scaly: No Temperature / Pain Maceration: No Temperature: No Abnormality Wound Preparation Ulcer Cleansing: Rinsed/Irrigated with Saline Topical Anesthetic Applied: Other: lidocaine 4%, Treatment Notes Wound #1 (Right, Proximal, Lateral Lower Leg) 1. Cleansed with: Clean wound with Normal Saline 2. Anesthetic Topical Lidocaine 4% cream to wound bed prior to debridement 3. Peri-wound Care: Moisturizing lotion 4. Dressing Applied: Other dressing (specify in notes) 5. Secondary Dressing Applied ABD Pad 7. Secured with 3 Layer Compression System - Right Lower Extremity Notes silvercell, xtrasorb Electronic Signature(s) Signed: 09/10/2017 4:16:02 PM By: Curtis Sites Entered By: Curtis Sites on 09/10/2017 08:41:43  Jessica CaseyLEATH, Blaize Marland Kitchen. ([id number]) -------------------------------------------------------------------------------- Wound Assessment Details Patient Name: Jessica HindLEATH, Karna R. Date of Service: 09/10/2017 8:00 AM Medical Record Number: [id number] Patient Account Number: [id number] Date of Birth/Sex: Feb 19, 1981 (36 y.o. Female) Treating RN: Curtis Sitesorthy, Joanna Primary Care Vern Guerette: PATIENT, NO Other Clinician: Referring Rayah Fines: Referral, Self Treating Tammie Yanda/Extender: Rudene ReBritto, Errol Weeks in Treatment: 0 Wound Status Wound Number: 2 Primary Etiology: Lymphedema Wound Location: Right Lower Leg - Lateral, Distal Wound Status: Open Wounding Event: Gradually Appeared Comorbid History: Lymphedema, Hypertension Date Acquired: 08/08/2017 Weeks Of Treatment: 0 Clustered Wound: No Photos Photo Uploaded By: Curtis Sitesorthy, Joanna on  09/10/2017 14:37:54 Wound Measurements Length: (cm) 3.9 Width: (cm) 7 Depth: (cm) 0.3 Area: (cm) 21.441 Volume: (cm) 6.432 % Reduction in Area: % Reduction in Volume: Epithelialization: None Tunneling: No Undermining: No Wound Description Full Thickness With Exposed Support Classification: Structures Wound Margin: Flat and Intact Exudate Large Amount: Exudate Type: Serous Exudate Color: amber Foul Odor After Cleansing: No Slough/Fibrino Yes Wound Bed Granulation Amount: Large (67-100%) Exposed Structure Granulation Quality: Red Fascia Exposed: No Necrotic Amount: Small (1-33%) Fat Layer (Subcutaneous Tissue) Exposed: Yes Necrotic Quality: Adherent Slough Tendon Exposed: No Muscle Exposed: No Joint Exposed: No Bone Exposed: No Dockstader, Lunna R. ([id number]) Periwound Skin Texture Texture Color No Abnormalities Noted: No No Abnormalities Noted: No Callus: No Atrophie Blanche: No Crepitus: No Cyanosis: No Excoriation: No Ecchymosis: No Induration: No Erythema: No Rash: No Hemosiderin Staining: No Scarring: No Mottled: No Pallor: No Moisture Rubor: No No Abnormalities Noted: No Dry / Scaly: No Temperature / Pain Maceration: No Temperature: No Abnormality Wound Preparation Ulcer Cleansing: Rinsed/Irrigated with Saline Topical Anesthetic Applied: Other: lidocaine 4%, Treatment Notes Wound #2 (Right, Distal, Lateral Lower Leg) 1. Cleansed with: Clean wound with Normal Saline 2. Anesthetic Topical Lidocaine 4% cream to wound bed prior to debridement 3. Peri-wound Care: Moisturizing lotion 4. Dressing Applied: Other dressing (specify in notes) 5. Secondary Dressing Applied ABD Pad 7. Secured with 3 Layer Compression System - Right Lower Extremity Notes silvercell, xtrasorb Electronic Signature(s) Signed: 09/10/2017 4:16:02 PM By: Curtis Sitesorthy, Joanna Entered By: Curtis Sitesorthy, Joanna on 09/10/2017 08:42:50 Creswell, Kayslee R.  ([id number]) -------------------------------------------------------------------------------- Vitals Details Patient Name: Jessica CaseyLEATH, Ethyle R. Date of Service: 09/10/2017 8:00 AM Medical Record Number: [id number] Patient Account Number: [id number] Date of Birth/Sex: Feb 19, 1981 (36 y.o. Female) Treating RN: Curtis Sitesorthy, Joanna Primary Care Cheron Coryell: PATIENT, NO Other Clinician: Referring Dashana Guizar: Referral, Self Treating Benelli Winther/Extender: Rudene ReBritto, Errol Weeks in Treatment: 0 Vital Signs Time Taken: 08:24 Temperature (F): 98.3 Height (in): 74 Pulse (bpm): 89 Source: Measured Respiratory Rate (breaths/min): 18 Weight (lbs): 505 Blood Pressure (mmHg): 138/98 Source: Measured Reference Range: 80 - 120 mg / dl Body Mass Index (BMI): 64.8 Electronic Signature(s) Signed: 09/10/2017 4:16:02 PM By: Curtis Sitesorthy, Joanna Entered By: Curtis Sitesorthy, Joanna on 09/10/2017 08:28:00

## 2017-09-17 ENCOUNTER — Encounter: Payer: Self-pay | Admitting: Surgery

## 2017-09-18 NOTE — Progress Notes (Signed)
[name], [name] ([id number]) Visit Report for 09/17/2017 Arrival Information Details Patient Name: [name], [name] Date of Service: 09/17/2017 10:15 AM Medical Record Number: [id number] Patient Account Number: [id number] Date of Birth/Sex: [birth year] (36 y.o. Female) Treating RN: Curtis Sitesorthy, Joanna Primary Care Kamesha Herne: PATIENT, NO Other Clinician: Referring Annah Jasko: Referral, Self Treating Lonzie Simmer/Extender: Rudene ReBritto, Errol Weeks in Treatment: 1 Visit Information History Since Last Visit Added or deleted any medications: No Patient Arrived: Ambulatory Any new allergies or adverse reactions: No Arrival Time: 10:29 Had a fall or experienced change in No Accompanied By: self activities of daily living that may affect Transfer Assistance: None risk of falls: Patient Identification Verified: Yes Signs or symptoms of abuse/neglect since last visito No Secondary Verification Process Completed: Yes Hospitalized since last visit: No Has Dressing in Place as Prescribed: Yes Has Compression in Place as Prescribed: Yes Pain Present Now: No Electronic Signature(s) Signed: 09/17/2017 6:04:08 PM By: Curtis Sitesorthy, Joanna Entered By: Curtis Sitesorthy, Joanna on 09/17/2017 10:29:52 Gilder, Marci R. ([id number]) -------------------------------------------------------------------------------- Compression Therapy Details Patient Name: [name], [name] Date of Service: 09/17/2017 10:15 AM Medical Record Number: [id number] Patient Account Number: [id number] Date of Birth/Sex: [birth year] (36 y.o. Female) Treating RN: Curtis Sitesorthy, Joanna Primary Care Noe Pittsley: PATIENT, NO Other Clinician: Referring Galilee Pierron: Referral, Self Treating Korra Christine/Extender: Rudene ReBritto, Errol Weeks in Treatment: 1 Compression Therapy Performed for Wound Assessment: Wound #1 Right,Proximal,Lateral Lower Leg Performed By: Clinician Curtis Sitesorthy, Joanna, RN Compression Type: Three Layer Post Procedure Diagnosis Same as Pre-procedure Electronic  Signature(s) Signed: 09/17/2017 12:39:02 PM By: Curtis Sitesorthy, Joanna Entered By: Curtis Sitesorthy, Joanna on 09/17/2017 12:39:02 [name], Lilinoe R. ([id number]) -------------------------------------------------------------------------------- Compression Therapy Details Patient Name: [name], [name] Date of Service: 09/17/2017 10:15 AM Medical Record Number: [id number] Patient Account Number: [id number] Date of Birth/Sex: [birth year] (36 y.o. Female) Treating RN: Curtis Sitesorthy, Joanna Primary Care Quintyn Dombek: PATIENT, NO Other Clinician: Referring Bintou Lafata: Referral, Self Treating Mckenlee Mangham/Extender: Rudene ReBritto, Errol Weeks in Treatment: 1 Compression Therapy Performed for Wound Assessment: Wound #2 Right,Distal,Lateral Lower Leg Performed By: Clinician Curtis Sitesorthy, Joanna, RN Compression Type: Three Layer Post Procedure Diagnosis Same as Pre-procedure Electronic Signature(s) Signed: 09/17/2017 12:39:02 PM By: Curtis Sitesorthy, Joanna Entered By: Curtis Sitesorthy, Joanna on 09/17/2017 12:39:02 [name], [name] ([id number]) -------------------------------------------------------------------------------- Encounter Discharge Information Details Patient Name: [name], [name] Date of Service: 09/17/2017 10:15 AM Medical Record Number: [id number] Patient Account Number: [id number] Date of Birth/Sex: [birth year] (36 y.o. Female) Treating RN: Curtis Sitesorthy, Joanna Primary Care Anjolaoluwa Siguenza: PATIENT, NO Other Clinician: Referring Rowyn Mustapha: Referral, Self Treating Lindell Renfrew/Extender: Rudene ReBritto, Errol Weeks in Treatment: 1 Encounter Discharge Information Items Discharge Pain Level: 0 Discharge Condition: Stable Ambulatory Status: Ambulatory Discharge Destination: Home Transportation: Private Auto Accompanied By: self Schedule Follow-up Appointment: Yes Medication Reconciliation completed and No provided to Patient/Care Madinah Quarry: Provided on Clinical Summary of Care: 09/17/2017 Form Type Recipient Paper Patient EL Electronic Signature(s) Signed: 09/17/2017  12:40:08 PM By: Curtis Sitesorthy, Joanna Entered By: Curtis Sitesorthy, Joanna on 09/17/2017 12:40:08 [name], [name] ([id number]) -------------------------------------------------------------------------------- Lower Extremity Assessment Details Patient Name: [name], [name] Date of Service: 09/17/2017 10:15 AM Medical Record Number: [id number] Patient Account Number: [id number] Date of Birth/Sex: [birth year] (36 y.o. Female) Treating RN: Curtis Sitesorthy, Joanna Primary Care Kylea Berrong: PATIENT, NO Other Clinician: Referring Maricella Filyaw: Referral, Self Treating Audrea Bolte/Extender: Rudene ReBritto, Errol Weeks in Treatment: 1 Edema Assessment Assessed: [Left: No] [Right: No] Edema: [Left: Ye] [Right: s] Calf Left: Right: Point of Measurement: 35 cm From Medial Instep cm cm Ankle Left: Right: Point of Measurement: 9 cm From Medial Instep cm cm Vascular Assessment Pulses: Dorsalis Pedis Palpable: [Right:Yes] Posterior Tibial Extremity colors, hair growth,  and conditions: Extremity Color: [Right:Hyperpigmented] Hair Growth on Extremity: [Right:Yes] Temperature of Extremity: [Right:Warm] Capillary Refill: [Right:< 3 seconds] Toe Nail Assessment Left: Right: Thick: Yes Discolored: Yes Deformed: Yes Improper Length and Hygiene: Yes Electronic Signature(s) Signed: 09/17/2017 6:04:08 PM By: Curtis Sites Entered By: Curtis Sites on 09/17/2017 10:43:42 Blando, [name] ([id number]) -------------------------------------------------------------------------------- Multi Wound Chart Details Patient Name: [name] R. Date of Service: 09/17/2017 10:15 AM Medical Record Number: [id number] Patient Account Number: [id number] Date of Birth/Sex: 04/24/1981 (36 y.o. Female) Treating RN: Curtis Sites Primary Care Ave Scharnhorst: PATIENT, NO Other Clinician: Referring Carvell Hoeffner: Referral, Self Treating Guenevere Roorda/Extender: Rudene Re in Treatment: 1 Vital Signs Height(in): 74 Pulse(bpm): 116 Weight(lbs): 505 Blood  Pressure(mmHg): 143/98 Body Mass Index(BMI): 65 Temperature(F): 97.8 Respiratory Rate 18 (breaths/min): Photos: [1:No Photos] [2:No Photos] [N/A:N/A] Wound Location: [1:Right Lower Leg - Lateral, Proximal] [2:Right Lower Leg - Lateral, Distal] [N/A:N/A] Wounding Event: [1:Gradually Appeared] [2:Gradually Appeared] [N/A:N/A] Primary Etiology: [1:Lymphedema] [2:Lymphedema] [N/A:N/A] Comorbid History: [1:Lymphedema, Hypertension] [2:Lymphedema, Hypertension] [N/A:N/A] Date Acquired: [1:08/08/2017] [2:08/08/2017] [N/A:N/A] Weeks of Treatment: [1:1] [2:1] [N/A:N/A] Wound Status: [1:Open] [2:Open] [N/A:N/A] Measurements L x W x D [1:1.2x1.6x0.1] [2:4.3x8x0.2] [N/A:N/A] (cm) Area (cm) : [1:1.508] [2:27.018] [N/A:N/A] Volume (cm) : [1:0.151] [2:5.404] [N/A:N/A] % Reduction in Area: [1:0.00%] [2:-26.00%] [N/A:N/A] % Reduction in Volume: [1:50.00%] [2:16.00%] [N/A:N/A] Classification: [1:Full Thickness With Exposed Support Structures] [2:Full Thickness With Exposed Support Structures] [N/A:N/A] Exudate Amount: [1:Large] [2:Large] [N/A:N/A] Exudate Type: [1:Serous] [2:Serous] [N/A:N/A] Exudate Color: [1:amber] [2:amber] [N/A:N/A] Foul Odor After Cleansing: [1:Yes] [2:Yes] [N/A:N/A] Odor Anticipated Due to [1:No] [2:No] [N/A:N/A] Product Use: Wound Margin: [1:Flat and Intact] [2:Flat and Intact] [N/A:N/A] Granulation Amount: [1:Large (67-100%)] [2:Large (67-100%)] [N/A:N/A] Granulation Quality: [1:Red] [2:Red] [N/A:N/A] Necrotic Amount: [1:Small (1-33%)] [2:Small (1-33%)] [N/A:N/A] Exposed Structures: [1:Fat Layer (Subcutaneous Tissue) Exposed: Yes Fascia: No Tendon: No Muscle: No Joint: No Bone: No] [2:Fat Layer (Subcutaneous Tissue) Exposed: Yes Fascia: No Tendon: No Muscle: No Joint: No Bone: No] [N/A:N/A] Epithelialization: [1:None] [2:None] [N/A:N/A] Periwound Skin Texture: [1:Excoriation: No Induration: No] [2:Excoriation: No Induration: No] [N/A:N/A] Callus: No Callus:  No Crepitus: No Crepitus: No Rash: No Rash: No Scarring: No Scarring: No Periwound Skin Moisture: Maceration: No Maceration: No N/A Dry/Scaly: No Dry/Scaly: No Periwound Skin Color: Atrophie Blanche: No Atrophie Blanche: No N/A Cyanosis: No Cyanosis: No Ecchymosis: No Ecchymosis: No Erythema: No Erythema: No Hemosiderin Staining: No Hemosiderin Staining: No Mottled: No Mottled: No Pallor: No Pallor: No Rubor: No Rubor: No Temperature: No Abnormality No Abnormality N/A Tenderness on Palpation: No No N/A Wound Preparation: Ulcer Cleansing: Ulcer Cleansing: N/A Rinsed/Irrigated with Saline, Rinsed/Irrigated with Saline, Other: soap and water Other: soap and water Topical Anesthetic Applied: Topical Anesthetic Applied: Other: lidocaine 4% Other: lidocaine 4% Treatment Notes Electronic Signature(s) Signed: 09/17/2017 10:54:07 AM By: Evlyn Kanner MD, FACS Entered By: Evlyn Kanner on 09/17/2017 10:54:07 [name] ([id number]) -------------------------------------------------------------------------------- Multi-Disciplinary Care Plan Details Patient Name: [name] R. Date of Service: 09/17/2017 10:15 AM Medical Record Number: [id number] Patient Account Number: [id number] Date of Birth/Sex: April 21, 1981 (36 y.o. Female) Treating RN: Curtis Sites Primary Care Zoanne Newill: PATIENT, NO Other Clinician: Referring Pericles Carmicheal: Referral, Self Treating Hardie Veltre/Extender: Rudene Re in Treatment: 1 Active Inactive ` Orientation to the Wound Care Program Nursing Diagnoses: Knowledge deficit related to the wound healing center program Goals: Patient/caregiver will verbalize understanding of the Wound Healing Center Program Date Initiated: 09/10/2017 Target Resolution Date: 11/23/2017 Goal Status: Active Interventions: Provide education on orientation to the wound center Notes: ` Venous Leg Ulcer Nursing Diagnoses: Knowledge deficit related to  disease  process and management Goals: Patient/caregiver will verbalize understanding of disease process and disease management Date Initiated: 09/10/2017 Target Resolution Date: 11/23/2017 Goal Status: Active Interventions: Assess peripheral edema status every visit. Notes: ` Wound/Skin Impairment Nursing Diagnoses: Impaired tissue integrity Goals: Ulcer/skin breakdown will heal within 14 weeks Date Initiated: 09/10/2017 Target Resolution Date: 11/24/2017 Goal Status: Active Interventions: ABBEE, CREMEENS ([id number]) Assess patient/caregiver ability to obtain necessary supplies Assess patient/caregiver ability to perform ulcer/skin care regimen upon admission and as needed Assess ulceration(s) every visit Notes: Electronic Signature(s) Signed: 09/17/2017 6:04:08 PM By: Curtis Sites Entered By: Curtis Sites on 09/17/2017 10:47:03 [name], [name] ([id number]) -------------------------------------------------------------------------------- Pain Assessment Details Patient Name: [name] R. Date of Service: 09/17/2017 10:15 AM Medical Record Number: [id number] Patient Account Number: [id number] Date of Birth/Sex: Mar 22, 1981 (36 y.o. Female) Treating RN: Curtis Sites Primary Care Brigitta Pricer: PATIENT, NO Other Clinician: Referring Ahsan Esterline: Referral, Self Treating Shylo Dillenbeck/Extender: Rudene Re in Treatment: 1 Active Problems Location of Pain Severity and Description of Pain Patient Has Paino Yes Site Locations Pain Location: Pain in Ulcers With Dressing Change: Yes Duration of the Pain. Constant / Intermittento Intermittent Pain Management and Medication Current Pain Management: Notes Topical or injectable lidocaine is offered to patient for acute pain when surgical debridement is performed. If needed, Patient is instructed to use over the counter pain medication for the following 24-48 hours after debridement. Wound care MDs do not prescribed pain  medications. Patient has chronic pain or uncontrolled pain. Patient has been instructed to make an appointment with their Primary Care Physician for pain management. Electronic Signature(s) Signed: 09/17/2017 6:04:08 PM By: Curtis Sites Entered By: Curtis Sites on 09/17/2017 10:30:07 [name] ([id number]) -------------------------------------------------------------------------------- Patient/Caregiver Education Details Patient Name: [name] R. Date of Service: 09/17/2017 10:15 AM Medical Record Number: [id number] Patient Account Number: [id number] Date of Birth/Gender: [birth year] (36 y.o. Female) Treating RN: Curtis Sites Primary Care Physician: PATIENT, NO Other Clinician: Referring Physician: Referral, Self Treating Physician/Extender: Rudene Re in Treatment: 1 Education Assessment Education Provided To: Patient Education Topics Provided Venous: Handouts: Other: leg elevation Methods: Explain/Verbal Responses: State content correctly Electronic Signature(s) Signed: 09/17/2017 6:04:08 PM By: Curtis Sites Entered By: Curtis Sites on 09/17/2017 12:40:33 Galan, Yancy R. ([id number]) -------------------------------------------------------------------------------- Wound Assessment Details Patient Name: [name] R. Date of Service: 09/17/2017 10:15 AM Medical Record Number: [id number] Patient Account Number: [id number] Date of Birth/Sex: Nov 25, 1980 (36 y.o. Female) Treating RN: Curtis Sites Primary Care Knolan Simien: PATIENT, NO Other Clinician: Referring Marrah Vanevery: Referral, Self Treating Traci Gafford/Extender: Rudene Re in Treatment: 1 Wound Status Wound Number: 1 Primary Etiology: Lymphedema Wound Location: Right Lower Leg - Lateral, Proximal Wound Status: Open Wounding Event: Gradually Appeared Comorbid History: Lymphedema, Hypertension Date Acquired: 08/08/2017 Weeks Of Treatment: 1 Clustered Wound: No Photos Photo Uploaded By:  Curtis Sites on 09/17/2017 12:32:21 Wound Measurements Length: (cm) 1.2 Width: (cm) 1.6 Depth: (cm) 0.1 Area: (cm) 1.508 Volume: (cm) 0.151 % Reduction in Area: 0% % Reduction in Volume: 50% Epithelialization: None Tunneling: No Undermining: No Wound Description Full Thickness With Exposed Support Classification: Structures Wound Margin: Flat and Intact Exudate Large Amount: Exudate Type: Serous Exudate Color: amber Foul Odor After Cleansing: Yes Due to Product Use: No Slough/Fibrino Yes Wound Bed Granulation Amount: Large (67-100%) Exposed Structure Granulation Quality: Red Fascia Exposed: No Necrotic Amount: Small (1-33%) Fat Layer (Subcutaneous Tissue) Exposed: Yes Necrotic Quality: Adherent Slough Tendon Exposed: No Muscle Exposed: No Joint Exposed: No Bone Exposed: No Mis, Madyson R. ([id number]) Periwound Skin Texture Texture  Color No Abnormalities Noted: No No Abnormalities Noted: No Callus: No Atrophie Blanche: No Crepitus: No Cyanosis: No Excoriation: No Ecchymosis: No Induration: No Erythema: No Rash: No Hemosiderin Staining: No Scarring: No Mottled: No Pallor: No Moisture Rubor: No No Abnormalities Noted: No Dry / Scaly: No Temperature / Pain Maceration: No Temperature: No Abnormality Wound Preparation Ulcer Cleansing: Rinsed/Irrigated with Saline, Other: soap and water, Topical Anesthetic Applied: Other: lidocaine 4%, Treatment Notes Wound #1 (Right, Proximal, Lateral Lower Leg) 1. Cleansed with: Clean wound with Normal Saline Cleanse wound with antibacterial soap and water 2. Anesthetic Topical Lidocaine 4% cream to wound bed prior to debridement 3. Peri-wound Care: Moisturizing lotion 4. Dressing Applied: Other dressing (specify in notes) 5. Secondary Dressing Applied ABD Pad 7. Secured with 3 Layer Compression System - Right Lower Extremity Notes silvercell, xtrasorb, carboflex Electronic Signature(s) Signed:  09/17/2017 6:04:08 PM By: Curtis Sites Entered By: Curtis Sites on 09/17/2017 10:42:58 Paolella, Loyola R. ([id number]) -------------------------------------------------------------------------------- Wound Assessment Details Patient Name: [name] R. Date of Service: 09/17/2017 10:15 AM Medical Record Number: [id number] Patient Account Number: [id number] Date of Birth/Sex: Sep 13, 1981 (36 y.o. Female) Treating RN: Curtis Sites Primary Care Molleigh Huot: PATIENT, NO Other Clinician: Referring Adele Milson: Referral, Self Treating Alivia Cimino/Extender: Rudene Re in Treatment: 1 Wound Status Wound Number: 2 Primary Etiology: Lymphedema Wound Location: Right Lower Leg - Lateral, Distal Wound Status: Open Wounding Event: Gradually Appeared Comorbid History: Lymphedema, Hypertension Date Acquired: 08/08/2017 Weeks Of Treatment: 1 Clustered Wound: No Photos Photo Uploaded By: Curtis Sites on 09/17/2017 12:32:42 Wound Measurements Length: (cm) 4.3 Width: (cm) 8 Depth: (cm) 0.2 Area: (cm) 27.018 Volume: (cm) 5.404 % Reduction in Area: -26% % Reduction in Volume: 16% Epithelialization: None Tunneling: No Undermining: No Wound Description Full Thickness With Exposed Support Classification: Structures Wound Margin: Flat and Intact Exudate Large Amount: Exudate Type: Serous Exudate Color: amber Foul Odor After Cleansing: Yes Due to Product Use: No Slough/Fibrino Yes Wound Bed Granulation Amount: Large (67-100%) Exposed Structure Granulation Quality: Red Fascia Exposed: No Necrotic Amount: Small (1-33%) Fat Layer (Subcutaneous Tissue) Exposed: Yes Necrotic Quality: Adherent Slough Tendon Exposed: No Muscle Exposed: No Joint Exposed: No Bone Exposed: No Philipson, Casidy R. ([id number]) Periwound Skin Texture Texture Color No Abnormalities Noted: No No Abnormalities Noted: No Callus: No Atrophie Blanche: No Crepitus: No Cyanosis: No Excoriation: No Ecchymosis:  No Induration: No Erythema: No Rash: No Hemosiderin Staining: No Scarring: No Mottled: No Pallor: No Moisture Rubor: No No Abnormalities Noted: No Dry / Scaly: No Temperature / Pain Maceration: No Temperature: No Abnormality Wound Preparation Ulcer Cleansing: Rinsed/Irrigated with Saline, Other: soap and water, Topical Anesthetic Applied: Other: lidocaine 4%, Treatment Notes Wound #2 (Right, Distal, Lateral Lower Leg) 1. Cleansed with: Clean wound with Normal Saline Cleanse wound with antibacterial soap and water 2. Anesthetic Topical Lidocaine 4% cream to wound bed prior to debridement 3. Peri-wound Care: Moisturizing lotion 4. Dressing Applied: Other dressing (specify in notes) 5. Secondary Dressing Applied ABD Pad 7. Secured with 3 Layer Compression System - Right Lower Extremity Notes silvercell, xtrasorb, carboflex Electronic Signature(s) Signed: 09/17/2017 6:04:08 PM By: Curtis Sites Entered By: Curtis Sites on 09/17/2017 10:43:14 Shostak, Patrizia R. ([id number]) -------------------------------------------------------------------------------- Vitals Details Patient Name: [name] R. Date of Service: 09/17/2017 10:15 AM Medical Record Number: [id number] Patient Account Number: [id number] Date of Birth/Sex: June 08, 1981 (36 y.o. Female) Treating RN: Curtis Sites Primary Care Jewell Haught: PATIENT, NO Other Clinician: Referring Silvino Selman: Referral, Self Treating Marayah Higdon/Extender: Rudene Re in Treatment: 1 Vital Signs Time Taken: 10:30  Temperature (F): 97.8 Height (in): 74 Pulse (bpm): 116 Source: Measured Respiratory Rate (breaths/min): 18 Weight (lbs): 505 Blood Pressure (mmHg): 143/98 Source: Measured Reference Range: 80 - 120 mg / dl Body Mass Index (BMI): 64.8 Electronic Signature(s) Signed: 09/17/2017 6:04:08 PM By: Curtis Sites Entered By: Curtis Sites on 09/17/2017 10:32:13

## 2017-09-18 NOTE — Progress Notes (Signed)
MAGDALEN, CABANA ([id number]) Visit Report for 09/17/2017 Chief Complaint Document Details Patient Name: [name], [name] Date of Service: 09/17/2017 10:15 AM Medical Record Number: [id number] Patient Account Number: [id number] Date of Birth/Sex: [birth year] (36 y.o. Female) Treating RN: Curtis Sites Primary Care Provider: PATIENT, NO Other Clinician: Referring Provider: Referral, Self Treating Provider/Extender: Rudene Re in Treatment: 1 Information Obtained from: Patient Chief Complaint Patient presents for treatment of an open ulcer due to venous insufficiency to the right lower extremity which she's had for about a month Electronic Signature(s) Signed: 09/17/2017 10:54:14 AM By: Evlyn Kanner MD, FACS Entered By: Evlyn Kanner on 09/17/2017 10:54:14 [name], [name] ([id number]) -------------------------------------------------------------------------------- HPI Details Patient Name: Jessica Casey R. Date of Service: 09/17/2017 10:15 AM Medical Record Number: [id number] Patient Account Number: [id number] Date of Birth/Sex: 10-10-1981 (36 y.o. Female) Treating RN: Curtis Sites Primary Care Provider: PATIENT, NO Other Clinician: Referring Provider: Referral, Self Treating Provider/Extender: Rudene Re in Treatment: 1 History of Present Illness Location: right lower extremity lateral calf several large areas Quality: Patient reports experiencing a sharp pain to affected area(s). Severity: Patient states wound are getting worse. Duration: Patient has had the wound for < 5 weeks prior to presenting for treatment Timing: Pain in wound is Intermittent (comes and goes Context: The wound would happen gradually Modifying Factors: Other treatment(s) tried include:admission to the hospital for sepsis and a full workup with treatment with IV antibiotics and discharged on oral antibiotics Associated Signs and Symptoms: Patient reports having increase discharge. HPI  Description: 36 year old patient well known to our [facility] wound care clinic where she has been seen since 2016 for bilateral lower extremity venous insufficiency disease with lymphedema and multiple ulcerations associated with morbid obesity. she had custom-made compression stockings and lymphedema pumps which were used in the past. most recently she was admitted to the hospital between October 11 and 09/02/2017 with sepsis, lower extremity wounds and lymphedema.she was initially treated in the outpatient with Keflex and Bactrim. she was initially treated in the hospital with vancomycin and Zosyn and changed over to Unasyn until her white count improved and her blood cultures were negative for 3 days. After her inpatient management she was discharged home on Augmentin to end on 09/13/2017 with a 14 day course. she has had outpatient vascular duplex scans completed in November 2017 and her right ABI was 1.1 and the left ABI is 1.3. she had normal toe brachial indices bilaterally.she had three-vessel runoff in the right lower extremity and two-vessel runoff in the left lower extremity. On questioning the patient she does have custom made compression stockings and also has a lymphedema pump but has not been using it appropriately and has not been taking good care of herself. 09/17/2017 -- she returns today with compression stockings on the left side and the right side has had significant amount of drainage and has a very strong odor Electronic Signature(s) Signed: 09/17/2017 10:54:46 AM By: Evlyn Kanner MD, FACS Entered By: Evlyn Kanner on 09/17/2017 10:54:45 [name] ([id number]) -------------------------------------------------------------------------------- Physical Exam Details Patient Name: Jessica Casey R. Date of Service: 09/17/2017 10:15 AM Medical Record Number: [id number] Patient Account Number: [id number] Date of Birth/Sex: Mar 22, 1981 (36 y.o. Female) Treating RN: Curtis Sites Primary Care Provider: PATIENT, NO Other Clinician: Referring Provider: Referral, Self Treating Provider/Extender: Rudene Re in Treatment: 1 Constitutional . Pulse regular. Respirations normal and unlabored. Afebrile. . Eyes Nonicteric. Reactive to light. Ears, Nose, Mouth, and Throat Lips, teeth, and gums WNL.Marland Kitchen Moist mucosa without lesions.  Neck supple and nontender. No palpable supraclavicular or cervical adenopathy. Normal sized without goiter. Respiratory WNL. No retractions.. Cardiovascular Pedal Pulses WNL. No clubbing, cyanosis or edema. Lymphatic No adneopathy. No adenopathy. No adenopathy. Musculoskeletal Adexa without tenderness or enlargement.. Digits and nails w/o clubbing, cyanosis, infection, petechiae, ischemia, or inflammatory conditions.. Integumentary (Hair, Skin) No suspicious lesions. No crepitus or fluctuance. No peri-wound warmth or erythema. No masses.Marland Kitchen Psychiatric Judgement and insight Intact.. No evidence of depression, anxiety, or agitation.. Notes the patient continues to improve as far as the lymphedema is concerned but the ulceration is still the same and the slough was washed out with moist saline gauze and no sharp debridement was done today. Electronic Signature(s) Signed: 09/17/2017 10:55:37 AM By: Evlyn Kanner MD, FACS Entered By: Evlyn Kanner on 09/17/2017 10:55:36 [name] ([id number]) -------------------------------------------------------------------------------- Physician Orders Details Patient Name: Jessica Casey R. Date of Service: 09/17/2017 10:15 AM Medical Record Number: [id number] Patient Account Number: [id number] Date of Birth/Sex: Mar 31, 1981 (36 y.o. Female) Treating RN: Curtis Sites Primary Care Provider: PATIENT, NO Other Clinician: Referring Provider: Referral, Self Treating Provider/Extender: Rudene Re in Treatment: 1 Verbal / Phone Orders: No Diagnosis Coding Wound Cleansing Wound #1  Right,Proximal,Lateral Lower Leg o Clean wound with Normal Saline. o May shower with protection. Wound #2 Right,Distal,Lateral Lower Leg o Clean wound with Normal Saline. o May shower with protection. Anesthetic Wound #1 Right,Proximal,Lateral Lower Leg o Topical Lidocaine 4% cream applied to wound bed prior to debridement Wound #2 Right,Distal,Lateral Lower Leg o Topical Lidocaine 4% cream applied to wound bed prior to debridement Primary Wound Dressing Wound #1 Right,Proximal,Lateral Lower Leg o Silvercel Non-Adherent Wound #2 Right,Distal,Lateral Lower Leg o Silvercel Non-Adherent Secondary Dressing Wound #1 Right,Proximal,Lateral Lower Leg o ABD pad o XtraSorb o Other - carboflex Wound #2 Right,Distal,Lateral Lower Leg o ABD pad o XtraSorb o Other - carboflex Dressing Change Frequency Wound #1 Right,Proximal,Lateral Lower Leg o Change dressing every week Wound #2 Right,Distal,Lateral Lower Leg o Change dressing every week Follow-up Appointments Wound #1 Right,Proximal,Lateral Lower Leg [name], [name] ([id number]) o Return Appointment in 1 week. o Nurse Visit as needed Wound #2 Right,Distal,Lateral Lower Leg o Return Appointment in 1 week. o Nurse Visit as needed Edema Control Wound #1 Right,Proximal,Lateral Lower Leg o 3 Layer Compression System - Right Lower Extremity Wound #2 Right,Distal,Lateral Lower Leg o 3 Layer Compression System - Right Lower Extremity Electronic Signature(s) Signed: 09/17/2017 12:32:19 PM By: Evlyn Kanner MD, FACS Signed: 09/17/2017 6:04:08 PM By: Curtis Sites Entered By: Curtis Sites on 09/17/2017 10:48:42 Hylton, Oaklynn R. ([id number]) -------------------------------------------------------------------------------- Problem List Details Patient Name: Jessica Casey R. Date of Service: 09/17/2017 10:15 AM Medical Record Number: [id number] Patient Account Number: [id number] Date of Birth/Sex:  02/21/1981 (37 y.o. Female) Treating RN: Curtis Sites Primary Care Provider: PATIENT, NO Other Clinician: Referring Provider: Referral, Self Treating Provider/Extender: Rudene Re in Treatment: 1 Active Problems ICD-10 Encounter Code Description Active Date Diagnosis L97.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d 09/10/2017 Yes I89.0 Lymphedema, not elsewhere classified 09/10/2017 Yes I87.311 Chronic venous hypertension (idiopathic) with ulcer of right lower 09/10/2017 Yes extremity E66.01 Morbid (severe) obesity due to excess calories 09/10/2017 Yes Inactive Problems Resolved Problems Electronic Signature(s) Signed: 09/17/2017 10:53:59 AM By: Evlyn Kanner MD, FACS Entered By: Evlyn Kanner on 09/17/2017 10:53:59 Becker, Azalynn R. ([id number]) -------------------------------------------------------------------------------- Progress Note Details Patient Name: Jessica Casey R. Date of Service: 09/17/2017 10:15 AM Medical Record Number: [id number] Patient Account Number: [id number] Date of Birth/Sex: 09-03-1981 (36 y.o.  Female) Treating RN: Curtis Sitesorthy, Joanna Primary Care Provider: PATIENT, NO Other Clinician: Referring Provider: Referral, Self Treating Provider/Extender: Rudene ReBritto, Katelynn Heidler Weeks in Treatment: 1 Subjective Chief Complaint Information obtained from Patient Patient presents for treatment of an open ulcer due to venous insufficiency to the right lower extremity which she's had for about a month History of Present Illness (HPI) The following HPI elements were documented for the patient's wound: Location: right lower extremity lateral calf several large areas Quality: Patient reports experiencing a sharp pain to affected area(s). Severity: Patient states wound are getting worse. Duration: Patient has had the wound for < 5 weeks prior to presenting for treatment Timing: Pain in wound is Intermittent (comes and goes Context: The wound would  happen gradually Modifying Factors: Other treatment(s) tried include:admission to the hospital for sepsis and a full workup with treatment with IV antibiotics and discharged on oral antibiotics Associated Signs and Symptoms: Patient reports having increase discharge. 36 year old patient [facility] where she has been seen since 2016 for bilateral lower extremity venous insufficiency disease with lymphedema and multiple ulcerations associated with morbid obesity. she had custom-made compression stockings and lymphedema pumps which were used in the past. most recently she was admitted to the hospital between October 11 and 09/02/2017 with sepsis, lower extremity wounds and lymphedema.she was initially treated in the outpatient with Keflex and Bactrim. she was initially treated in the hospital with vancomycin and Zosyn and changed over to Unasyn until her white count improved and her blood cultures were negative for 3 days. After her inpatient management she was discharged home on Augmentin to end on 09/13/2017 with a 14 day course. she has had outpatient vascular duplex scans completed in November 2017 and her right ABI was 1.1 and the left ABI is 1.3. she had normal toe brachial indices bilaterally.she had three-vessel runoff in the right lower extremity and two-vessel runoff in the left lower extremity. On questioning the patient she does have custom made compression stockings and also has a lymphedema pump but has not been using it appropriately and has not been taking good care of herself. 09/17/2017 -- she returns today with compression stockings on the left side and the right side has had significant amount of drainage and has a very strong odor Patient History Information obtained from Patient. Family History Kidney Disease - Mother, No family history of Cancer, Diabetes, Heart Disease, Hereditary Spherocytosis, Hypertension, Lung Disease,  Seizures, Stroke, Thyroid Problems, Tuberculosis. Social History Never smoker, Marital Status - Married, Alcohol Use - Never, Drug Use - No History, Caffeine Use - Daily. [name], [name] ([id number]) Objective Constitutional Pulse regular. Respirations normal and unlabored. Afebrile. Vitals Time Taken: 10:30 AM, Height: 74 in, Source: Measured, Weight: 505 lbs, Source: Measured, BMI: 64.8, Temperature: 97.8 F, Pulse: 116 bpm, Respiratory Rate: 18 breaths/min, Blood Pressure: 143/98 mmHg. Eyes Nonicteric. Reactive to light. Ears, Nose, Mouth, and Throat Lips, teeth, and gums WNL.Marland Kitchen. Moist mucosa without lesions. Neck supple and nontender. No palpable supraclavicular or cervical adenopathy. Normal sized without goiter. Respiratory WNL. No retractions.. Cardiovascular Pedal Pulses WNL. No clubbing, cyanosis or edema. Lymphatic No adneopathy. No adenopathy. No adenopathy. Musculoskeletal Adexa without tenderness or enlargement.. Digits and nails w/o clubbing, cyanosis, infection, petechiae, ischemia, or inflammatory conditions.Marland Kitchen. Psychiatric Judgement and insight Intact.. No evidence of depression, anxiety, or agitation.. General Notes: the patient continues to improve as far as the lymphedema is concerned but the ulceration is still the same and the slough was washed out  with moist saline gauze and no sharp debridement was done today. Integumentary (Hair, Skin) No suspicious lesions. No crepitus or fluctuance. No peri-wound warmth or erythema. No masses.. Wound #1 status is Open. Original cause of wound was Gradually Appeared. The wound is located on the Right,Proximal,Lateral Lower Leg. The wound measures 1.2cm length x 1.6cm width x 0.1cm depth; 1.508cm^2 area and 0.151cm^3 volume. There is Fat Layer (Subcutaneous Tissue) Exposed exposed. There is no tunneling or undermining noted. There is a large amount of serous drainage noted. Foul odor after cleansing was noted. The wound margin  is flat and intact. There is large (67-100%) red granulation within the wound bed. There is a small (1-33%) amount of necrotic tissue within the wound bed including Adherent Slough. The periwound skin appearance did not exhibit: Callus, Crepitus, Excoriation, Induration, Rash, Scarring, Dry/Scaly, Maceration, Atrophie Blanche, Cyanosis, Ecchymosis, Hemosiderin Staining, Mottled, Pallor, Rubor, Erythema. Periwound temperature was noted as No Abnormality. [name], [name] ([id number]) Wound #2 status is Open. Original cause of wound was Gradually Appeared. The wound is located on the Right,Distal,Lateral Lower Leg. The wound measures 4.3cm length x 8cm width x 0.2cm depth; 27.018cm^2 area and 5.404cm^3 volume. There is Fat Layer (Subcutaneous Tissue) Exposed exposed. There is no tunneling or undermining noted. There is a large amount of serous drainage noted. Foul odor after cleansing was noted. The wound margin is flat and intact. There is large (67-100%) red granulation within the wound bed. There is a small (1-33%) amount of necrotic tissue within the wound bed including Adherent Slough. The periwound skin appearance did not exhibit: Callus, Crepitus, Excoriation, Induration, Rash, Scarring, Dry/Scaly, Maceration, Atrophie Blanche, Cyanosis, Ecchymosis, Hemosiderin Staining, Mottled, Pallor, Rubor, Erythema. Periwound temperature was noted as No Abnormality. Assessment Active Problems ICD-10 L97.212 - 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d I89.0 - Lymphedema, not elsewhere classified I87.311 - Chronic venous hypertension (idiopathic) with ulcer of right lower extremity E66.01 - Morbid (severe) obesity due to excess calories Procedures Wound #1 Pre-procedure diagnosis of Wound #1 is a Lymphedema located on the Right,Proximal,Lateral Lower Leg . There was a Three Layer Compression Therapy Procedure by Curtis Sites, RN. Post procedure Diagnosis Wound #1: Same as  Pre-Procedure Wound #2 Pre-procedure diagnosis of Wound #2 is a Lymphedema located on the Right,Distal,Lateral Lower Leg . There was a Three Layer Compression Therapy Procedure by Curtis Sites, RN. Post procedure Diagnosis Wound #2: Same as Pre-Procedure Plan Wound Cleansing: Wound #1 Right,Proximal,Lateral Lower Leg: Clean wound with Normal Saline. May shower with protection. Wound #2 Right,Distal,Lateral Lower Leg: Clean wound with Normal Saline. May shower with protection. Anesthetic: Wound #1 Right,Proximal,Lateral Lower Leg: Topical Lidocaine 4% cream applied to wound bed prior to debridement [name], [name] ([id number]) Wound #2 Right,Distal,Lateral Lower Leg: Topical Lidocaine 4% cream applied to wound bed prior to debridement Primary Wound Dressing: Wound #1 Right,Proximal,Lateral Lower Leg: Silvercel Non-Adherent Wound #2 Right,Distal,Lateral Lower Leg: Silvercel Non-Adherent Secondary Dressing: Wound #1 Right,Proximal,Lateral Lower Leg: ABD pad XtraSorb Other - carboflex Wound #2 Right,Distal,Lateral Lower Leg: ABD pad XtraSorb Other - carboflex Dressing Change Frequency: Wound #1 Right,Proximal,Lateral Lower Leg: Change dressing every week Wound #2 Right,Distal,Lateral Lower Leg: Change dressing every week Follow-up Appointments: Wound #1 Right,Proximal,Lateral Lower Leg: Return Appointment in 1 week. Nurse Visit as needed Wound #2 Right,Distal,Lateral Lower Leg: Return Appointment in 1 week. Nurse Visit as needed Edema Control: Wound #1 Right,Proximal,Lateral Lower Leg: 3 Layer Compression System - Right Lower Extremity Wound #2 Right,Distal,Lateral Lower Leg: 3 Layer Compression System -  Right Lower Extremity After review today I have recommended: 1. Silver alginate with a 3 layer compression wrap to the right lower extremity - we will also use some extrasorb and some carboflex 2. Use her custom built compression stockings of the left lower  extremity 3. Lymphedema pumps to be utilized twice a day for an hour each 4. Elevation and exercises been discussed with her in great detail 5. Adequate protein, vitamin A, vitamin C and zinc 6. Regular visits to the wound center Electronic Signature(s) Signed: 09/17/2017 2:50:06 PM By: Evlyn Kanner MD, FACS Previous Signature: 09/17/2017 10:56:38 AM Version By: Evlyn Kanner MD, FACS Entered By: Evlyn Kanner on 09/17/2017 14:50:05 [name] ([id number]) -------------------------------------------------------------------------------- ROS/PFSH Details Patient Name: Jessica Casey R. Date of Service: 09/17/2017 10:15 AM Medical Record Number: [id number] Patient Account Number: [id number] Date of Birth/Sex: 04-09-1981 (36 y.o. Female) Treating RN: Curtis Sites Primary Care Provider: PATIENT, NO Other Clinician: Referring Provider: Referral, Self Treating Provider/Extender: Rudene Re in Treatment: 1 Information Obtained From Patient Wound History Do you currently have one or more open woundso Yes How many open wounds do you currently haveo 1 Approximately how long have you had your woundso 1 month How have you been treating your wound(s) until nowo aquacel ag Has your wound(s) ever healed and then re-openedo No Have you had any lab work done in the past montho No Have you tested positive for an antibiotic resistant organism (MRSA, VRE)o No Have you tested positive for osteomyelitis (bone infection)o No Have you had any tests for circulation on your legso Yes Who ordered the testo [facility] Where was the test doneo [facility] Hematologic/Lymphatic Medical History: Positive for: Lymphedema Respiratory Medical History: Negative for: Aspiration; Asthma; Chronic Obstructive Pulmonary Disease (COPD); Pneumothorax; Sleep Apnea; Tuberculosis Cardiovascular Medical History: Positive for: Hypertension Negative for: Angina; Arrhythmia; Congestive Heart Failure; Coronary Artery  Disease; Deep Vein Thrombosis; Hypotension; Myocardial Infarction; Peripheral Arterial Disease; Peripheral Venous Disease; Phlebitis; Vasculitis Immunizations Pneumococcal Vaccine: Received Pneumococcal Vaccination: No Immunization Notes: up to date Implantable Devices Family and Social History Cancer: No; Diabetes: No; Heart Disease: No; Hereditary Spherocytosis: No; Hypertension: No; Kidney Disease: Yes - Mother; Lung Disease: No; Seizures: No; Stroke: No; Thyroid Problems: No; Tuberculosis: No; Never smoker; Marital Status - Married; Alcohol Use: Never; Drug Use: No History; Caffeine Use: Daily; Financial Concerns: No; Food, Clothing or Shelter Needs: No; Support System Lacking: No; Transportation Concerns: No; Advanced Directives: No; Patient does not want information on Advanced Directives [name], [name] ([id number]) Physician Affirmation I have reviewed and agree with the above information. Electronic Signature(s) Signed: 09/17/2017 12:32:19 PM By: Evlyn Kanner MD, FACS Signed: 09/17/2017 6:04:08 PM By: Curtis Sites Entered By: Evlyn Kanner on 09/17/2017 10:54:59 [name], Jessica RMarland Kitchen ([id number]) -------------------------------------------------------------------------------- SuperBill Details Patient Name: Jessica Casey R. Date of Service: 09/17/2017 Medical Record Number: [id number] Patient Account Number: [id number] Date of Birth/Sex: 1981/04/23 (36 y.o. Female) Treating RN: Curtis Sites Primary Care Provider: PATIENT, NO Other Clinician: Referring Provider: Referral, Self Treating Provider/Extender: Rudene Re in Treatment: 1 Diagnosis Coding ICD-10 Codes Code Description [phone] 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d I89.0 Lymphedema, not elsewhere classified I87.311 Chronic venous hypertension (idiopathic) with ulcer of right lower extremity E66.01 Morbid (severe) obesity due to excess calories Facility Procedures CPT4 Code:  [id number] Description: (Facility Use Only) [phone] - APPLY MULTLAY COMPRS LWR LT LEG Modifier: Quantity: 1 Physician Procedures CPT4 Code: 5956387 Description: 99213 - WC PHYS LEVEL 3 - EST PT ICD-10 Diagnosis Description L97.212  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 I89.0 Lymphedema, not elsewhere classified I87.311 Chronic venous hypertension (idiopathic) with ulcer of  right l E66.01 Morbid (severe) obesity due to excess calories Modifier: d ower extremity Quantity: 1 Electronic Signature(s) Signed: 09/17/2017 12:39:20 PM By: Curtis Sites Signed: 09/17/2017 4:25:36 PM By: Evlyn Kanner MD, FACS Previous Signature: 09/17/2017 10:56:52 AM Version By: Evlyn Kanner MD, FACS Entered By: Curtis Sites on 09/17/2017 12:39:20

## 2017-09-24 ENCOUNTER — Encounter: Payer: Self-pay | Attending: Surgery | Admitting: Surgery

## 2017-09-24 DIAGNOSIS — I890 Lymphedema, not elsewhere classified: Secondary | ICD-10-CM | POA: Insufficient documentation

## 2017-09-24 DIAGNOSIS — L97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d: Secondary | ICD-10-CM | POA: Insufficient documentation

## 2017-09-24 DIAGNOSIS — I87311 Chronic venous hypertension (idiopathic) with ulcer of right lower extremity: Secondary | ICD-10-CM | POA: Insufficient documentation

## 2017-09-24 DIAGNOSIS — Z6841 Body Mass Index (BMI) 40.0 and over, adult: Secondary | ICD-10-CM | POA: Insufficient documentation

## 2017-09-24 DIAGNOSIS — I10 Essential (primary) hypertension: Secondary | ICD-10-CM | POA: Insufficient documentation

## 2017-09-24 NOTE — Progress Notes (Addendum)
[name], [name] ([id number]) Visit Report for 09/24/2017 Chief Complaint Document Details Patient Name: [name], [name] Date of Service: 09/24/2017 9:15 AM Medical Record Number: [id number] Patient Account Number: [id number] Date of Birth/Sex: 07/28/1981 (36 y.o. Female) Treating RN: Curtis Sites Primary Care Provider: PATIENT, NO Other Clinician: Referring Provider: Referral, Self Treating Provider/Extender: Rudene Re in Treatment: 2 Information Obtained from: Patient Chief Complaint Patient presents for treatment of an open ulcer due to venous insufficiency to the right lower extremity which she's had for about a month Electronic Signature(s) Signed: 09/24/2017 9:55:54 AM By: Evlyn Kanner MD, FACS Entered By: Evlyn Kanner on 09/24/2017 09:55:53 Stiverson, Kamaile R. ([id number]) -------------------------------------------------------------------------------- HPI Details Patient Name: Jessica Casey R. Date of Service: 09/24/2017 9:15 AM Medical Record Number: [id number] Patient Account Number: [id number] Date of Birth/Sex: 22-Mar-1981 (36 y.o. Female) Treating RN: Curtis Sites Primary Care Provider: PATIENT, NO Other Clinician: Referring Provider: Referral, Self Treating Provider/Extender: Rudene Re in Treatment: 2 History of Present Illness Location: right lower extremity lateral calf several large areas Quality: Patient reports experiencing a sharp pain to affected area(s). Severity: Patient states wound are getting worse. Duration: Patient has had the wound for < 5 weeks prior to presenting for treatment Timing: Pain in wound is Intermittent (comes and goes Context: The wound would happen gradually Modifying Factors: Other treatment(s) tried include:admission to the hospital for sepsis and a full workup with treatment with IV antibiotics and discharged on oral antibiotics Associated Signs and Symptoms: Patient reports having increase discharge. HPI Description:  36 year old patient well known to our [facility] wound care clinic where she has been seen since 2016 for bilateral lower extremity venous insufficiency disease with lymphedema and multiple ulcerations associated with morbid obesity. she had custom-made compression stockings and lymphedema pumps which were used in the past. most recently she was admitted to the hospital between October 11 and 09/02/2017 with sepsis, lower extremity wounds and lymphedema.she was initially treated in the outpatient with Keflex and Bactrim. she was initially treated in the hospital with vancomycin and Zosyn and changed over to Unasyn until her white count improved and her blood cultures were negative for 3 days. After her inpatient management she was discharged home on Augmentin to end on 09/13/2017 with a 14 day course. she has had outpatient vascular duplex scans completed in November 2017 and her right ABI was 1.1 and the left ABI is 1.3. she had normal toe brachial indices bilaterally.she had three-vessel runoff in the right lower extremity and two-vessel runoff in the left lower extremity. On questioning the patient she does have custom made compression stockings and also has a lymphedema pump but has not been using it appropriately and has not been taking good care of herself. 09/17/2017 -- she returns today with compression stockings on the left side and the right side has had significant amount of drainage and has a very strong odor 09/24/2017 -- the drainage is increased significantly and she has more lymphedema and a very strong odor to her wound. Though she does not have systemic symptoms, or overt infection I believe she will benefit from some doxycycline given empirically Electronic Signature(s) Signed: 09/24/2017 9:56:40 AM By: Evlyn Kanner MD, FACS Entered By: Evlyn Kanner on 09/24/2017 09:56:39 Schickling, Cristal Deer  ([id number]) -------------------------------------------------------------------------------- Physical Exam Details Patient Name: Jessica Casey R. Date of Service: 09/24/2017 9:15 AM Medical Record Number: [id number] Patient Account Number: [id number] Date of Birth/Sex: 23-Sep-1981 (36 y.o. Female) Treating RN: Curtis Sites Primary Care Provider: PATIENT, NO Other Clinician: Referring  Provider: Referral, Self Treating Provider/Extender: Rudene Re in Treatment: 2 Constitutional . Pulse regular. Respirations normal and unlabored. Afebrile. . Eyes Nonicteric. Reactive to light. Ears, Nose, Mouth, and Throat Lips, teeth, and gums WNL.Marland Kitchen Moist mucosa without lesions. Neck supple and nontender. No palpable supraclavicular or cervical adenopathy. Normal sized without goiter. Respiratory WNL. No retractions.. Cardiovascular Pedal Pulses WNL. No clubbing, cyanosis or edema. Lymphatic No adneopathy. No adenopathy. No adenopathy. Musculoskeletal Adexa without tenderness or enlargement.. Digits and nails w/o clubbing, cyanosis, infection, petechiae, ischemia, or inflammatory conditions.. Integumentary (Hair, Skin) No suspicious lesions. No crepitus or fluctuance. No peri-wound warmth or erythema. No masses.Marland Kitchen Psychiatric Judgement and insight Intact.. No evidence of depression, anxiety, or agitation.. Notes the lymphedema is worse today and she has got a very strong odor from her wound and though no sharp debridement was required overall I believe her general condition is such she will benefit from oral antibiotics empirically Electronic Signature(s) Signed: 09/24/2017 9:58:43 AM By: Evlyn Kanner MD, FACS Entered By: Evlyn Kanner on 09/24/2017 09:58:42 [name] ([id number]) -------------------------------------------------------------------------------- Physician Orders Details Patient Name: Jessica Casey R. Date of Service: 09/24/2017 9:15 AM Medical Record Number:  [id number] Patient Account Number: [id number] Date of Birth/Sex: 12-06-1980 (36 y.o. Female) Treating RN: Curtis Sites Primary Care Provider: PATIENT, NO Other Clinician: Referring Provider: Referral, Self Treating Provider/Extender: Rudene Re in Treatment: 2 Verbal / Phone Orders: No Diagnosis Coding Wound Cleansing Wound #1 Right,Proximal,Lateral Lower Leg o Cleanse wound with mild soap and water Wound #2 Right,Distal,Lateral Lower Leg o Cleanse wound with mild soap and water Anesthetic Wound #1 Right,Proximal,Lateral Lower Leg o Topical Lidocaine 4% cream applied to wound bed prior to debridement Wound #2 Right,Distal,Lateral Lower Leg o Topical Lidocaine 4% cream applied to wound bed prior to debridement Skin Barriers/Peri-Wound Care Wound #1 Right,Proximal,Lateral Lower Leg o Barrier cream Wound #2 Right,Distal,Lateral Lower Leg o Barrier cream Primary Wound Dressing Wound #1 Right,Proximal,Lateral Lower Leg o Silvercel Non-Adherent Wound #2 Right,Distal,Lateral Lower Leg o Silvercel Non-Adherent Secondary Dressing Wound #1 Right,Proximal,Lateral Lower Leg o ABD pad o XtraSorb o Other - carboflex Wound #2 Right,Distal,Lateral Lower Leg o ABD pad o XtraSorb o Other - carboflex Dressing Change Frequency Wound #1 Right,Proximal,Lateral Lower Leg o Dressing is to be changed Monday and Thursday. AEON, KOORS R. ([id number]) Wound #2 Right,Distal,Lateral Lower Leg o Dressing is to be changed Monday and Thursday. Follow-up Appointments Wound #1 Right,Proximal,Lateral Lower Leg o Return Appointment in 1 week. o Nurse Visit as needed - Thursday Wound #2 Right,Distal,Lateral Lower Leg o Return Appointment in 1 week. o Nurse Visit as needed - Thursday Edema Control Wound #1 Right,Proximal,Lateral Lower Leg o 3 Layer Compression System - Right Lower Extremity Wound #2 Right,Distal,Lateral Lower Leg o 3 Layer  Compression System - Right Lower Extremity Additional Orders / Instructions Wound #1 Right,Proximal,Lateral Lower Leg o Increase protein intake. o Other: - Please watch your salt (sodium) intake Wound #2 Right,Distal,Lateral Lower Leg o Increase protein intake. o Other: - Please watch your salt (sodium) intake Medications-please add to medication list. Wound #1 Right,Proximal,Lateral Lower Leg o P.O. Antibiotics Wound #2 Right,Distal,Lateral Lower Leg o P.O. Antibiotics Patient Medications Allergies: No Known Allergies Notifications Medication Indication Start End doxycycline hyclate 09/24/2017 DOSE oral 100 mg capsule - capsule oral bid Electronic Signature(s) Signed: 09/24/2017 12:44:08 PM By: Evlyn Kanner MD, FACS Signed: 09/24/2017 5:07:14 PM By: Curtis Sites Previous Signature: 09/24/2017 9:55:23 AM Version By: Evlyn Kanner MD, FACS Previous Signature: 09/24/2017 9:55:04 AM Version By: Evlyn Kanner  MD, FACS Entered By: Curtis Sites on 09/24/2017 09:58:01 Lindenberger, Cristal Deer ([id number]) -------------------------------------------------------------------------------- Problem List Details Patient Name: Jessica Casey R. Date of Service: 09/24/2017 9:15 AM Medical Record Number: [id number] Patient Account Number: [id number] Date of Birth/Sex: 1981-02-26 (36 y.o. Female) Treating RN: Curtis Sites Primary Care Provider: PATIENT, NO Other Clinician: Referring Provider: Referral, Self Treating Provider/Extender: Rudene Re in Treatment: 2 Active Problems ICD-10 Encounter Code Description Active Date Diagnosis L97.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d 09/10/2017 Yes I89.0 Lymphedema, not elsewhere classified 09/10/2017 Yes I87.311 Chronic venous hypertension (idiopathic) with ulcer of right lower 09/10/2017 Yes extremity E66.01 Morbid (severe) obesity due to excess calories 09/10/2017 Yes Inactive Problems Resolved  Problems Electronic Signature(s) Signed: 09/24/2017 9:55:36 AM By: Evlyn Kanner MD, FACS Entered By: Evlyn Kanner on 09/24/2017 09:55:36 Rieves, Olubunmi R. ([id number]) -------------------------------------------------------------------------------- Progress Note Details Patient Name: Jessica Casey R. Date of Service: 09/24/2017 9:15 AM Medical Record Number: [id number] Patient Account Number: [id number] Date of Birth/Sex: 02-11-1981 (36 y.o. Female) Treating RN: Curtis Sites Primary Care Provider: PATIENT, NO Other Clinician: Referring Provider: Referral, Self Treating Provider/Extender: Rudene Re in Treatment: 2 Subjective Chief Complaint Information obtained from Patient Patient presents for treatment of an open ulcer due to venous insufficiency to the right lower extremity which she's had for about a month History of Present Illness (HPI) The following HPI elements were documented for the patient's wound: Location: right lower extremity lateral calf several large areas Quality: Patient reports experiencing a sharp pain to affected area(s). Severity: Patient states wound are getting worse. Duration: Patient has had the wound for < 5 weeks prior to presenting for treatment Timing: Pain in wound is Intermittent (comes and goes Context: The wound would happen gradually Modifying Factors: Other treatment(s) tried include:admission to the hospital for sepsis and a full workup with treatment with IV antibiotics and discharged on oral antibiotics Associated Signs and Symptoms: Patient reports having increase discharge. 36 year old patient well known to our [facility] wound [facility] clinic where she has been seen since 2016 for bilateral lower extremity venous insufficiency disease with lymphedema and multiple ulcerations associated with morbid obesity. she had custom-made compression stockings and lymphedema pumps which were used in the past. most recently she was admitted to the  hospital between October 11 and 09/02/2017 with sepsis, lower extremity wounds and lymphedema.she was initially treated in the outpatient with Keflex and Bactrim. she was initially treated in the hospital with vancomycin and Zosyn and changed over to Unasyn until her white count improved and her blood cultures were negative for 3 days. After her inpatient management she was discharged home on Augmentin to end on 09/13/2017 with a 14 day course. she has had outpatient vascular duplex scans completed in November 2017 and her right ABI was 1.1 and the left ABI is 1.3. she had normal toe brachial indices bilaterally.she had three-vessel runoff in the right lower extremity and two-vessel runoff in the left lower extremity. On questioning the patient she does have custom made compression stockings and also has a lymphedema pump but has not been using it appropriately and has not been taking good care of herself. 09/17/2017 -- she returns today with compression stockings on the left side and the right side has had significant amount of drainage and has a very strong odor 09/24/2017 -- the drainage is increased significantly and she has more lymphedema and a very strong odor to her wound. Though she does not have systemic symptoms, or overt infection I believe  she will benefit from some doxycycline given empirically Patient History Information obtained from Patient. Family History Kidney Disease - Mother, No family history of Cancer, Diabetes, Heart Disease, Hereditary Spherocytosis, Hypertension, Lung Disease, Seizures, Niswander, Forrest R. ([id number]) Stroke, Thyroid Problems, Tuberculosis. Social History Never smoker, Marital Status - Married, Alcohol Use - Never, Drug Use - No History, Caffeine Use - Daily. Objective Constitutional Pulse regular. Respirations normal and unlabored. Afebrile. Vitals Time Taken: 9:36 AM, Height: 74 in, Weight: 505 lbs, BMI: 64.8, Temperature: 98.3 F, Pulse: 95 bpm,  Respiratory Rate: 20 breaths/min, Blood Pressure: 148/94 mmHg. Eyes Nonicteric. Reactive to light. Ears, Nose, Mouth, and Throat Lips, teeth, and gums WNL.Marland Kitchen Moist mucosa without lesions. Neck supple and nontender. No palpable supraclavicular or cervical adenopathy. Normal sized without goiter. Respiratory WNL. No retractions.. Cardiovascular Pedal Pulses WNL. No clubbing, cyanosis or edema. Lymphatic No adneopathy. No adenopathy. No adenopathy. Musculoskeletal Adexa without tenderness or enlargement.. Digits and nails w/o clubbing, cyanosis, infection, petechiae, ischemia, or inflammatory conditions.Marland Kitchen Psychiatric Judgement and insight Intact.. No evidence of depression, anxiety, or agitation.. General Notes: the lymphedema is worse today and she has got a very strong odor from her wound and though no sharp debridement was required overall I believe her general condition is such she will benefit from oral antibiotics empirically Integumentary (Hair, Skin) No suspicious lesions. No crepitus or fluctuance. No peri-wound warmth or erythema. No masses.. Wound #1 status is Open. Original cause of wound was Gradually Appeared. The wound is located on the Right,Proximal,Lateral Lower Leg. The wound measures 1.1cm length x 1.5cm width x 0.1cm depth; 1.296cm^2 area and 0.13cm^3 volume. There is Fat Layer (Subcutaneous Tissue) Exposed exposed. There is no tunneling or undermining noted. Meyerhoff, Ashlei R. ([id number]) There is a large amount of serous drainage noted. Foul odor after cleansing was noted. The wound margin is flat and intact. There is large (67-100%) red granulation within the wound bed. There is a small (1-33%) amount of necrotic tissue within the wound bed including Adherent Slough. The periwound skin appearance did not exhibit: Callus, Crepitus, Excoriation, Induration, Rash, Scarring, Dry/Scaly, Maceration, Atrophie Blanche, Cyanosis, Ecchymosis, Hemosiderin Staining,  Mottled, Pallor, Rubor, Erythema. Periwound temperature was noted as No Abnormality. Wound #2 status is Open. Original cause of wound was Gradually Appeared. The wound is located on the Right,Distal,Lateral Lower Leg. The wound measures 4.5cm length x 8.1cm width x 0.2cm depth; 28.628cm^2 area and 5.726cm^3 volume. There is Fat Layer (Subcutaneous Tissue) Exposed exposed. There is no tunneling or undermining noted. There is a large amount of serous drainage noted. Foul odor after cleansing was noted. The wound margin is flat and intact. There is large (67-100%) red granulation within the wound bed. There is a small (1-33%) amount of necrotic tissue within the wound bed including Adherent Slough. The periwound skin appearance did not exhibit: Callus, Crepitus, Excoriation, Induration, Rash, Scarring, Dry/Scaly, Maceration, Atrophie Blanche, Cyanosis, Ecchymosis, Hemosiderin Staining, Mottled, Pallor, Rubor, Erythema. Periwound temperature was noted as No Abnormality. Assessment Active Problems ICD-10 L97.212 - 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d I89.0 - Lymphedema, not elsewhere classified I87.311 - Chronic venous hypertension (idiopathic) with ulcer of right lower extremity E66.01 - Morbid (severe) obesity due to excess calories Plan Wound Cleansing: Wound #1 Right,Proximal,Lateral Lower Leg: Cleanse wound with mild soap and water Wound #2 Right,Distal,Lateral Lower Leg: Cleanse wound with mild soap and water Anesthetic: Wound #1 Right,Proximal,Lateral Lower Leg: Topical Lidocaine 4% cream applied to wound bed prior to debridement Wound #2 Right,Distal,Lateral Lower  Leg: Topical Lidocaine 4% cream applied to wound bed prior to debridement Skin Barriers/Peri-Wound Care: Wound #1 Right,Proximal,Lateral Lower Leg: Barrier cream Wound #2 Right,Distal,Lateral Lower Leg: Barrier cream Primary Wound Dressing: Wound #1 Right,Proximal,Lateral Lower Leg: Silvercel  Non-Adherent Wound #2 Right,Distal,Lateral Lower Leg: Silvercel Non-Adherent Secondary Dressing: Wound #1 Right,Proximal,Lateral Lower Leg: ABD pad Vizzini, Savonna R. ([id number]) XtraSorb Other - carboflex Wound #2 Right,Distal,Lateral Lower Leg: ABD pad XtraSorb Other - carboflex Dressing Change Frequency: Wound #1 Right,Proximal,Lateral Lower Leg: Dressing is to be changed Monday and Thursday. Wound #2 Right,Distal,Lateral Lower Leg: Dressing is to be changed Monday and Thursday. Follow-up Appointments: Wound #1 Right,Proximal,Lateral Lower Leg: Return Appointment in 1 week. Nurse Visit as needed - Thursday Wound #2 Right,Distal,Lateral Lower Leg: Return Appointment in 1 week. Nurse Visit as needed - Thursday Edema Control: Wound #1 Right,Proximal,Lateral Lower Leg: 3 Layer Compression System - Right Lower Extremity Wound #2 Right,Distal,Lateral Lower Leg: 3 Layer Compression System - Right Lower Extremity Additional Orders / Instructions: Wound #1 Right,Proximal,Lateral Lower Leg: Increase protein intake. Other: - Please watch your salt (sodium) intake Wound #2 Right,Distal,Lateral Lower Leg: Increase protein intake. Other: - Please watch your salt (sodium) intake Medications-please add to medication list.: Wound #1 Right,Proximal,Lateral Lower Leg: P.O. Antibiotics Wound #2 Right,Distal,Lateral Lower Leg: P.O. Antibiotics The following medication(s) was prescribed: doxycycline hyclate oral 100 mg capsule capsule oral bid starting 09/24/2017 after clinical examination and discussion with the patient I have recommended: 1. Silver alginate with a 3 layer compression wrap to the right lower extremity - we will also use some extrasorb and some carboflex. She will need a nurse visit during the week. 2. doxycycline 100 mg twice a day for 14 days 3. Use her custom built compression stockings of the left lower extremity 4. Lymphedema pumps to be utilized twice a day for an hour  each 5. Elevation and exercises been discussed with her in great detail 6. Adequate protein, vitamin A, vitamin C and zinc 7. I have discussed her diet and the need to watch her salt and fluid intake and she says she will be careful 8. Regular visits to the wound center Electronic Signature(s) Signed: 09/24/2017 10:01:47 AM By: Evlyn Kanner MD, FACS Russman, Mckaila R. ([id number]) Entered By: Evlyn Kanner on 09/24/2017 10:01:47 [name] ([id number]) -------------------------------------------------------------------------------- ROS/PFSH Details Patient Name: Jessica Casey R. Date of Service: 09/24/2017 9:15 AM Medical Record Number: [id number] Patient Account Number: [id number] Date of Birth/Sex: October 09, 1981 (36 y.o. Female) Treating RN: Curtis Sites Primary Care Provider: PATIENT, NO Other Clinician: Referring Provider: Referral, Self Treating Provider/Extender: Rudene Re in Treatment: 2 Information Obtained From Patient Wound History Do you currently have one or more open woundso Yes How many open wounds do you currently haveo 1 Approximately how long have you had your woundso 1 month How have you been treating your wound(s) until nowo aquacel ag Has your wound(s) ever healed and then re-openedo No Have you had any lab work done in the past montho No Have you tested positive for an antibiotic resistant organism (MRSA, VRE)o No Have you tested positive for osteomyelitis (bone infection)o No Have you had any tests for circulation on your legso Yes Who ordered the testo [facility] Where was the test doneo [facility] Hematologic/Lymphatic Medical History: Positive for: Lymphedema Respiratory Medical History: Negative for: Aspiration; Asthma; Chronic Obstructive Pulmonary Disease (COPD); Pneumothorax; Sleep Apnea; Tuberculosis Cardiovascular Medical History: Positive for: Hypertension Negative for: Angina; Arrhythmia; Congestive Heart Failure; Coronary Artery Disease; Deep  Vein Thrombosis; Hypotension; Myocardial  Infarction; Peripheral Arterial Disease; Peripheral Venous Disease; Phlebitis; Vasculitis Immunizations Pneumococcal Vaccine: Received Pneumococcal Vaccination: No Immunization Notes: up to date Implantable Devices Family and Social History Cancer: No; Diabetes: No; Heart Disease: No; Hereditary Spherocytosis: No; Hypertension: No; Kidney Disease: Yes - Mother; Lung Disease: No; Seizures: No; Stroke: No; Thyroid Problems: No; Tuberculosis: No; Never smoker; Marital Status - Married; Alcohol Use: Never; Drug Use: No History; Caffeine Use: Daily; Financial Concerns: No; Food, Clothing or Shelter Needs: No; Support System Lacking: No; Transportation Concerns: No; Advanced Directives: No; Patient does not want information on Advanced Directives Marlaine HindLEATH, Tashana R. ([id number]) Physician Affirmation I have reviewed and agree with the above information. Electronic Signature(s) Signed: 09/24/2017 12:44:08 PM By: Evlyn KannerBritto, Aviyah Swetz MD, FACS Signed: 09/24/2017 5:07:14 PM By: Curtis Sitesorthy, Joanna Entered By: Evlyn KannerBritto, Brevon Dewald on 09/24/2017 09:56:51 Rud, Jeanenne Marland Kitchen. ([id number]) -------------------------------------------------------------------------------- SuperBill Details Patient Name: Jessica CaseyLEATH, Keyleigh R. Date of Service: 09/24/2017 Medical Record Number: [id number] Patient Account Number: [id number] Date of Birth/Sex: [birth year] (36 y.o. Female) Treating RN: Curtis Sitesorthy, Joanna Primary Care Provider: PATIENT, NO Other Clinician: Referring Provider: Referral, Self Treating Provider/Extender: Rudene ReBritto, Mckaila Duffus Weeks in Treatment: 2 Diagnosis Coding ICD-10 Codes Code Description 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d I89.0 Lymphedema, not elsewhere classified I87.311 Chronic venous hypertension (idiopathic) with ulcer of right lower extremity E66.01 Morbid (severe) obesity due to excess calories Facility Procedures CPT4 Code Description: [id number]  (Facility Use Only) [id number] - APPLY MULTLAY COMPRS LWR RT LEG Modifier: Quantity: 1 Physician Procedures CPT4 Code: [id number] Description: 99213 - WC PHYS LEVEL 3 - EST PT ICD-10 Diagnosis Description L97.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 I89.0 Lymphedema, not elsewhere classified I87.311 Chronic venous hypertension (idiopathic) with ulcer of  right l E66.01 Morbid (severe) obesity due to excess calories Modifier: d ower extremity Quantity: 1 Electronic Signature(s) Signed: 09/24/2017 12:37:33 PM By: Curtis Sitesorthy, Joanna Signed: 09/24/2017 12:44:08 PM By: Evlyn KannerBritto, Lonya Johannesen MD, FACS Previous Signature: 09/24/2017 10:02:07 AM Version By: Evlyn KannerBritto, Kahari Critzer MD, FACS Entered By: Curtis Sitesorthy, Joanna on 09/24/2017 12:37:32

## 2017-09-25 NOTE — Progress Notes (Signed)
[name], Jaliya R. ([id number]) Visit Report for 09/24/2017 Arrival Information Details Patient Name: [name], [name] Date of Service: 09/24/2017 9:15 AM Medical Record Number: [id number] Patient Account Number: [id number] Date of Birth/Sex: 09/27/1981 (36 y.o. Female) Treating RN: Curtis Sitesorthy, Joanna Primary Care Shalev Helminiak: PATIENT, NO Other Clinician: Referring Zakeria Kulzer: Referral, Self Treating Taela Charbonneau/Extender: Rudene ReBritto, Errol Weeks in Treatment: 2 Visit Information History Since Last Visit Added or deleted any medications: No Patient Arrived: Ambulatory Any new allergies or adverse reactions: No Arrival Time: 09:32 Had a fall or experienced change in No Accompanied By: self activities of daily living that may affect Transfer Assistance: None risk of falls: Patient Identification Verified: Yes Signs or symptoms of abuse/neglect since last visito No Secondary Verification Process Completed: Yes Hospitalized since last visit: No Has Dressing in Place as Prescribed: Yes Has Compression in Place as Prescribed: Yes Pain Present Now: No Electronic Signature(s) Signed: 09/24/2017 5:07:14 PM By: Curtis Sitesorthy, Joanna Entered By: Curtis Sitesorthy, Joanna on 09/24/2017 09:34:49 Calderwood, Emmaleigh R. ([id number]) -------------------------------------------------------------------------------- Encounter Discharge Information Details Patient Name: [name], [name] Date of Service: 09/24/2017 9:15 AM Medical Record Number: [id number] Patient Account Number: [id number] Date of Birth/Sex: 09/27/1981 (36 y.o. Female) Treating RN: Curtis Sitesorthy, Joanna Primary Care Tysheka Fanguy: PATIENT, NO Other Clinician: Referring Alby Schwabe: Referral, Self Treating Avanti Jetter/Extender: Rudene ReBritto, Errol Weeks in Treatment: 2 Encounter Discharge Information Items Discharge Pain Level: 0 Discharge Condition: Stable Ambulatory Status: Ambulatory Discharge Destination: Home Transportation: Private Auto Accompanied By: self Schedule Follow-up Appointment:  Yes Medication Reconciliation completed and No provided to Patient/Care Rayner Erman: Provided on Clinical Summary of Care: 09/24/2017 Form Type Recipient Paper Patient EL Electronic Signature(s) Signed: 09/24/2017 12:38:21 PM By: Curtis Sitesorthy, Joanna Entered By: Curtis Sitesorthy, Joanna on 09/24/2017 12:38:21 Rumer, Cristal DeerERICA R. ([id number]) -------------------------------------------------------------------------------- Lower Extremity Assessment Details Patient Name: [name], [name] Date of Service: 09/24/2017 9:15 AM Medical Record Number: [id number] Patient Account Number: [id number] Date of Birth/Sex: 09/27/1981 (36 y.o. Female) Treating RN: Curtis Sitesorthy, Joanna Primary Care Abree Romick: PATIENT, NO Other Clinician: Referring Tarik Teixeira: Referral, Self Treating Jamae Tison/Extender: Rudene ReBritto, Errol Weeks in Treatment: 2 Edema Assessment Assessed: [Left: No] [Right: No] Edema: [Left: Ye] [Right: s] Calf Left: Right: Point of Measurement: 35 cm From Medial Instep cm cm Ankle Left: Right: Point of Measurement: 9 cm From Medial Instep cm cm Vascular Assessment Pulses: Dorsalis Pedis Palpable: [Right:Yes] Posterior Tibial Palpable: [Right:Yes] Extremity colors, hair growth, and conditions: Extremity Color: [Right:Hyperpigmented] Hair Growth on Extremity: [Right:Yes] Temperature of Extremity: [Right:Warm] Capillary Refill: [Right:< 3 seconds] Electronic Signature(s) Signed: 09/24/2017 5:07:14 PM By: Curtis Sitesorthy, Joanna Entered By: Curtis Sitesorthy, Joanna on 09/24/2017 09:51:22 Huizinga, Senya R. ([id number]) -------------------------------------------------------------------------------- Multi Wound Chart Details Patient Name: [name], [name] Date of Service: 09/24/2017 9:15 AM Medical Record Number: [id number] Patient Account Number: [id number] Date of Birth/Sex: 09/27/1981 (36 y.o. Female) Treating RN: Curtis Sitesorthy, Joanna Primary Care Niomi Valent: PATIENT, NO Other Clinician: Referring Ursula Dermody: Referral, Self Treating  Ica Daye/Extender: Rudene ReBritto, Errol Weeks in Treatment: 2 Vital Signs Height(in): 74 Pulse(bpm): 95 Weight(lbs): 505 Blood Pressure(mmHg): 148/94 Body Mass Index(BMI): 65 Temperature(F): 98.3 Respiratory Rate 20 (breaths/min): Photos: [1:No Photos] [2:No Photos] [N/A:N/A] Wound Location: [1:Right Lower Leg - Lateral, Proximal] [2:Right Lower Leg - Lateral, Distal] [N/A:N/A] Wounding Event: [1:Gradually Appeared] [2:Gradually Appeared] [N/A:N/A] Primary Etiology: [1:Lymphedema] [2:Lymphedema] [N/A:N/A] Comorbid History: [1:Lymphedema, Hypertension] [2:Lymphedema, Hypertension] [N/A:N/A] Date Acquired: [1:08/08/2017] [2:08/08/2017] [N/A:N/A] Weeks of Treatment: [1:2] [2:2] [N/A:N/A] Wound Status: [1:Open] [2:Open] [N/A:N/A] Measurements L x W x D [1:1.1x1.5x0.1] [2:4.5x8.1x0.2] [N/A:N/A] (cm) Area (cm) : [1:1.296] [2:28.628] [N/A:N/A] Volume (cm) : [1:0.13] [2:5.726] [N/A:N/A] % Reduction in Area: [1:14.10%] [2:-33.50%] [  N/A:N/A] % Reduction in Volume: [1:57.00%] [2:11.00%] [N/A:N/A] Classification: [1:Full Thickness With Exposed Support Structures] [2:Full Thickness With Exposed Support Structures] [N/A:N/A] Exudate Amount: [1:Large] [2:Large] [N/A:N/A] Exudate Type: [1:Serous] [2:Serous] [N/A:N/A] Exudate Color: [1:amber] [2:amber] [N/A:N/A] Foul Odor After Cleansing: [1:Yes] [2:Yes] [N/A:N/A] Odor Anticipated Due to [1:No] [2:No] [N/A:N/A] Product Use: Wound Margin: [1:Flat and Intact] [2:Flat and Intact] [N/A:N/A] Granulation Amount: [1:Large (67-100%)] [2:Large (67-100%)] [N/A:N/A] Granulation Quality: [1:Red] [2:Red] [N/A:N/A] Necrotic Amount: [1:Small (1-33%)] [2:Small (1-33%)] [N/A:N/A] Exposed Structures: [1:Fat Layer (Subcutaneous Tissue) Exposed: Yes Fascia: No Tendon: No Muscle: No Joint: No Bone: No] [2:Fat Layer (Subcutaneous Tissue) Exposed: Yes Fascia: No Tendon: No Muscle: No Joint: No Bone: No] [N/A:N/A] Epithelialization: [1:None] [2:None] [N/A:N/A] Periwound  Skin Texture: [1:Excoriation: No Induration: No] [2:Excoriation: No Induration: No] [N/A:N/A] Callus: No Callus: No Crepitus: No Crepitus: No Rash: No Rash: No Scarring: No Scarring: No Periwound Skin Moisture: Maceration: No Maceration: No N/A Dry/Scaly: No Dry/Scaly: No Periwound Skin Color: Atrophie Blanche: No Atrophie Blanche: No N/A Cyanosis: No Cyanosis: No Ecchymosis: No Ecchymosis: No Erythema: No Erythema: No Hemosiderin Staining: No Hemosiderin Staining: No Mottled: No Mottled: No Pallor: No Pallor: No Rubor: No Rubor: No Temperature: No Abnormality No Abnormality N/A Tenderness on Palpation: No No N/A Wound Preparation: Ulcer Cleansing: Ulcer Cleansing: N/A Rinsed/Irrigated with Saline, Rinsed/Irrigated with Saline, Other: soap and water Other: soap and water Topical Anesthetic Applied: Topical Anesthetic Applied: Other: lidocaine 4% Other: lidocaine 4% Treatment Notes Electronic Signature(s) Signed: 09/24/2017 9:55:44 AM By: Evlyn Kanner MD, FACS Entered By: Evlyn Kanner on 09/24/2017 09:55:43 Marcy, Cristal Deer ([id number]) -------------------------------------------------------------------------------- Multi-Disciplinary Care Plan Details Patient Name: Jessica Casey R. Date of Service: 09/24/2017 9:15 AM Medical Record Number: [id number] Patient Account Number: [id number] Date of Birth/Sex: 02-Sep-1981 (36 y.o. Female) Treating RN: Curtis Sites Primary Care Jayten Gabbard: PATIENT, NO Other Clinician: Referring Bodee Lafoe: Referral, Self Treating Mande Auvil/Extender: Rudene Re in Treatment: 2 Active Inactive ` Orientation to the Wound Care Program Nursing Diagnoses: Knowledge deficit related to the wound healing center program Goals: Patient/caregiver will verbalize understanding of the Wound Healing Center Program Date Initiated: 09/10/2017 Target Resolution Date: 11/23/2017 Goal Status: Active Interventions: Provide education on  orientation to the wound center Notes: ` Venous Leg Ulcer Nursing Diagnoses: Knowledge deficit related to disease process and management Goals: Patient/caregiver will verbalize understanding of disease process and disease management Date Initiated: 09/10/2017 Target Resolution Date: 11/23/2017 Goal Status: Active Interventions: Assess peripheral edema status every visit. Notes: ` Wound/Skin Impairment Nursing Diagnoses: Impaired tissue integrity Goals: Ulcer/skin breakdown will heal within 14 weeks Date Initiated: 09/10/2017 Target Resolution Date: 11/24/2017 Goal Status: Active Interventions: PAULINA, MUCHMORE ([id number]) Assess patient/caregiver ability to obtain necessary supplies Assess patient/caregiver ability to perform ulcer/skin care regimen upon admission and as needed Assess ulceration(s) every visit Notes: Electronic Signature(s) Signed: 09/24/2017 5:07:14 PM By: Curtis Sites Entered By: Curtis Sites on 09/24/2017 09:51:29 Cavendish, Yesenia R. ([id number]) -------------------------------------------------------------------------------- Pain Assessment Details Patient Name: Jessica Casey R. Date of Service: 09/24/2017 9:15 AM Medical Record Number: [id number] Patient Account Number: [id number] Date of Birth/Sex: 10/22/1981 (36 y.o. Female) Treating RN: Curtis Sites Primary Care Jaiyana Canale: PATIENT, NO Other Clinician: Referring Myrth Dahan: Referral, Self Treating Solaris Kram/Extender: Rudene Re in Treatment: 2 Active Problems Location of Pain Severity and Description of Pain Patient Has Paino Yes Site Locations Pain Location: Pain in Ulcers With Dressing Change: Yes Duration of the Pain. Constant / Intermittento Intermittent Pain Management and Medication Current Pain Management: Notes Topical or injectable lidocaine is offered to patient for acute pain  when surgical debridement is performed. If needed, Patient is instructed to use over the counter pain  medication for the following 24-48 hours after debridement. Wound care MDs do not prescribed pain medications. Patient has chronic pain or uncontrolled pain. Patient has been instructed to make an appointment with their Primary Care Physician for pain management. Electronic Signature(s) Signed: 09/24/2017 5:07:14 PM By: Curtis Sitesorthy, Joanna Entered By: Curtis Sitesorthy, Joanna on 09/24/2017 09:35:04 [name], Jozalyn R. ([id number]) -------------------------------------------------------------------------------- Patient/Caregiver Education Details Patient Name: [name], Emberlie R. Date of Service: 09/24/2017 9:15 AM Medical Record Number: [id number] Patient Account Number: [id number] Date of Birth/Gender: 06/28/1981 ([age] y.o. Female) Treating RN: Curtis Sitesorthy, Joanna Primary Care Physician: PATIENT, NO Other Clinician: Referring Physician: Referral, Self Treating Physician/Extender: Rudene ReBritto, Errol Weeks in Treatment: 2 Education Assessment Education Provided To: Patient Education Topics Provided Venous: Handouts: Other: please use compression pumps Methods: Explain/Verbal Responses: State content correctly Electronic Signature(s) Signed: 09/24/2017 5:07:14 PM By: Curtis Sitesorthy, Joanna Entered By: Curtis Sitesorthy, Joanna on 09/24/2017 12:39:05 Donahoe, Adianna R. ([id number]) -------------------------------------------------------------------------------- Wound Assessment Details Patient Name: [name], Tenea R. Date of Service: 09/24/2017 9:15 AM Medical Record Number: [id number] Patient Account Number: [id number] Date of Birth/Sex: 06/28/1981 (36 y.o. Female) Treating RN: Curtis Sitesorthy, Joanna Primary Care Carlia Bomkamp: PATIENT, NO Other Clinician: Referring Alli Jasmer: Referral, Self Treating Josedaniel Haye/Extender: Rudene ReBritto, Errol Weeks in Treatment: 2 Wound Status Wound Number: 1 Primary Etiology: Lymphedema Wound Location: Right Lower Leg - Lateral, Proximal Wound Status: Open Wounding Event: Gradually Appeared Comorbid History: Lymphedema,  Hypertension Date Acquired: 08/08/2017 Weeks Of Treatment: 2 Clustered Wound: No Wound Measurements Length: (cm) 1.1 Width: (cm) 1.5 Depth: (cm) 0.1 Area: (cm) 1.296 Volume: (cm) 0.13 % Reduction in Area: 14.1% % Reduction in Volume: 57% Epithelialization: None Tunneling: No Undermining: No Wound Description Full Thickness With Exposed Support Classification: Structures Wound Margin: Flat and Intact Exudate Large Amount: Exudate Type: Serous Exudate Color: amber Foul Odor After Cleansing: Yes Due to Product Use: No Slough/Fibrino Yes Wound Bed Granulation Amount: Large (67-100%) Exposed Structure Granulation Quality: Red Fascia Exposed: No Necrotic Amount: Small (1-33%) Fat Layer (Subcutaneous Tissue) Exposed: Yes Necrotic Quality: Adherent Slough Tendon Exposed: No Muscle Exposed: No Joint Exposed: No Bone Exposed: No Periwound Skin Texture Texture Color No Abnormalities Noted: No No Abnormalities Noted: No Callus: No Atrophie Blanche: No Crepitus: No Cyanosis: No Excoriation: No Ecchymosis: No Induration: No Erythema: No Rash: No Hemosiderin Staining: No Scarring: No Mottled: No Pallor: No Moisture Rubor: No No Abnormalities Noted: No Dry / Scaly: No Temperature / Pain Bourne, Carline R. ([id number]) Maceration: No Temperature: No Abnormality Wound Preparation Ulcer Cleansing: Rinsed/Irrigated with Saline, Other: soap and water, Topical Anesthetic Applied: Other: lidocaine 4%, Treatment Notes Wound #1 (Right, Proximal, Lateral Lower Leg) 1. Cleansed with: Clean wound with Normal Saline Cleanse wound with antibacterial soap and water 2. Anesthetic Topical Lidocaine 4% cream to wound bed prior to debridement 3. Peri-wound Care: Barrier cream Moisturizing lotion 4. Dressing Applied: Other dressing (specify in notes) 5. Secondary Dressing Applied ABD Pad 7. Secured with 3 Layer Compression System - Right Lower  Extremity Notes silvercell, xtrasorb, carboflex Electronic Signature(s) Signed: 09/24/2017 5:07:14 PM By: Curtis Sitesorthy, Joanna Entered By: Curtis Sitesorthy, Joanna on 09/24/2017 09:55:15 Sleeth, Magali R. ([id number]) -------------------------------------------------------------------------------- Wound Assessment Details Patient Name: [name], Miley R. Date of Service: 09/24/2017 9:15 AM Medical Record Number: [id number] Patient Account Number: [id number] Date of Birth/Sex: 06/28/1981 (36 y.o. Female) Treating RN: Curtis Sitesorthy, Joanna Primary Care Dane Kopke: PATIENT, NO Other Clinician: Referring Benjimin Hadden: Referral, Self Treating Staley Lunz/Extender: Rudene ReBritto, Errol Weeks in Treatment: 2 Wound Status Wound  Number: 2 Primary Etiology: Lymphedema Wound Location: Right Lower Leg - Lateral, Distal Wound Status: Open Wounding Event: Gradually Appeared Comorbid History: Lymphedema, Hypertension Date Acquired: 08/08/2017 Weeks Of Treatment: 2 Clustered Wound: No Wound Measurements Length: (cm) 4.5 Width: (cm) 8.1 Depth: (cm) 0.2 Area: (cm) 28.628 Volume: (cm) 5.726 % Reduction in Area: -33.5% % Reduction in Volume: 11% Epithelialization: None Tunneling: No Undermining: No Wound Description Full Thickness With Exposed Support Classification: Structures Wound Margin: Flat and Intact Exudate Large Amount: Exudate Type: Serous Exudate Color: amber Foul Odor After Cleansing: Yes Due to Product Use: No Slough/Fibrino Yes Wound Bed Granulation Amount: Large (67-100%) Exposed Structure Granulation Quality: Red Fascia Exposed: No Necrotic Amount: Small (1-33%) Fat Layer (Subcutaneous Tissue) Exposed: Yes Necrotic Quality: Adherent Slough Tendon Exposed: No Muscle Exposed: No Joint Exposed: No Bone Exposed: No Periwound Skin Texture Texture Color No Abnormalities Noted: No No Abnormalities Noted: No Callus: No Atrophie Blanche: No Crepitus: No Cyanosis: No Excoriation: No Ecchymosis:  No Induration: No Erythema: No Rash: No Hemosiderin Staining: No Scarring: No Mottled: No Pallor: No Moisture Rubor: No No Abnormalities Noted: No Dry / Scaly: No Temperature / Pain Foucher, Elizah R. ([id number]) Maceration: No Temperature: No Abnormality Wound Preparation Ulcer Cleansing: Rinsed/Irrigated with Saline, Other: soap and water, Topical Anesthetic Applied: Other: lidocaine 4%, Treatment Notes Wound #2 (Right, Distal, Lateral Lower Leg) 1. Cleansed with: Clean wound with Normal Saline Cleanse wound with antibacterial soap and water 2. Anesthetic Topical Lidocaine 4% cream to wound bed prior to debridement 3. Peri-wound Care: Barrier cream Moisturizing lotion 4. Dressing Applied: Other dressing (specify in notes) 5. Secondary Dressing Applied ABD Pad 7. Secured with 3 Layer Compression System - Right Lower Extremity Notes silvercell, xtrasorb, carboflex Electronic Signature(s) Signed: 09/24/2017 5:07:14 PM By: Curtis Sites Entered By: Curtis Sites on 09/24/2017 09:55:33 Brzozowski, Terica R. ([id number]) -------------------------------------------------------------------------------- Vitals Details Patient Name: Jessica Casey R. Date of Service: 09/24/2017 9:15 AM Medical Record Number: [id number] Patient Account Number: [id number] Date of Birth/Sex: [birth year] (36 y.o. Female) Treating RN: Curtis Sites Primary Care Rosaisela Jamroz: PATIENT, NO Other Clinician: Referring Hilario Robarts: Referral, Self Treating Amberlin Utke/Extender: Rudene Re in Treatment: 2 Vital Signs Time Taken: 09:36 Temperature (F): 98.3 Height (in): 74 Pulse (bpm): 95 Weight (lbs): 505 Respiratory Rate (breaths/min): 20 Body Mass Index (BMI): 64.8 Blood Pressure (mmHg): 148/94 Reference Range: 80 - 120 mg / dl Electronic Signature(s) Signed: 09/24/2017 5:07:14 PM By: Curtis Sites Entered By: Curtis Sites on 09/24/2017 09:36:29

## 2017-09-30 NOTE — Progress Notes (Signed)
[name], Takiah R. ([id number]) Visit Report for 09/27/2017 Arrival Information Details Patient Name: [name], [name] Date of Service: 09/27/2017 8:00 AM Medical Record Number: [id number] Patient Account Number: [id number] Date of Birth/Sex: [birth year] (36 y.o. Female) Treating RN: Ashok CordiaPinkerton, Debi Primary Care Amana Bouska: PATIENT, NO Other Clinician: Referring Keeanna Villafranca: Referral, Self Treating Anvika Gashi/Extender: Rudene ReBritto, Errol Weeks in Treatment: 2 Visit Information History Since Last Visit All ordered tests and consults were completed: No Patient Arrived: Ambulatory Added or deleted any medications: No Arrival Time: 08:02 Any new allergies or adverse reactions: No Accompanied By: self Had a fall or experienced change in No Transfer Assistance: None activities of daily living that may affect Patient Identification Verified: Yes risk of falls: Secondary Verification Process Completed: Yes Signs or symptoms of abuse/neglect since last visito No Patient Requires Transmission-Based No Hospitalized since last visit: No Precautions: Has Dressing in Place as Prescribed: Yes Patient Has Alerts: No Has Compression in Place as Prescribed: Yes Pain Present Now: Yes Electronic Signature(s) Signed: 09/28/2017 4:36:47 PM By: Alejandro MullingPinkerton, Debra Entered By: Alejandro MullingPinkerton, Debra on 09/27/2017 08:04:07 [name], Tomica R. ([id number]) -------------------------------------------------------------------------------- Encounter Discharge Information Details Patient Name: [name], [name] Date of Service: 09/27/2017 8:00 AM Medical Record Number: [id number] Patient Account Number: [id number] Date of Birth/Sex: [birth year] (36 y.o. Female) Treating RN: Ashok CordiaPinkerton, Debi Primary Care Shyam Dawson: PATIENT, NO Other Clinician: Referring Rushawn Capshaw: Referral, Self Treating Mazey Mantell/Extender: Rudene ReBritto, Errol Weeks in Treatment: 2 Encounter Discharge Information Items Discharge Pain Level: 0 Discharge Condition: Stable Ambulatory  Status: Ambulatory Discharge Destination: Home Private Transportation: Auto Accompanied By: self Schedule Follow-up Appointment: Yes Medication Reconciliation completed and No provided to Patient/Care Raunel Dimartino: Clinical Summary of Care: Electronic Signature(s) Signed: 09/28/2017 4:36:47 PM By: Alejandro MullingPinkerton, Debra Entered By: Alejandro MullingPinkerton, Debra on 09/27/2017 08:29:00 [name], Naseem R. ([id number]) -------------------------------------------------------------------------------- Patient/Caregiver Education Details Patient Name: [name], [name] Date of Service: 09/27/2017 8:00 AM Medical Record Number: [id number] Patient Account Number: [id number] Date of Birth/Gender: [birth year] (36 y.o. Female) Treating RN: Ashok CordiaPinkerton, Debi Primary Care Physician: PATIENT, NO Other Clinician: Referring Physician: Referral, Self Treating Physician/Extender: Rudene ReBritto, Errol Weeks in Treatment: 2 Education Assessment Education Provided To: Patient Education Topics Provided Wound/Skin Impairment: Handouts: Other: do not get wrap wet Methods: Demonstration, Explain/Verbal Responses: State content correctly Electronic Signature(s) Signed: 09/28/2017 4:36:47 PM By: Alejandro MullingPinkerton, Debra Entered By: Alejandro MullingPinkerton, Debra on 09/27/2017 08:28:47 Wiginton, Shanah R. ([id number]) -------------------------------------------------------------------------------- Wound Assessment Details Patient Name: [name], [name] Date of Service: 09/27/2017 8:00 AM Medical Record Number: [id number] Patient Account Number: [id number] Date of Birth/Sex: [birth year] (36 y.o. Female) Treating RN: Ashok CordiaPinkerton, Debi Primary Care Billiejean Schimek: PATIENT, NO Other Clinician: Referring Danahi Reddish: Referral, Self Treating Travontae Freiberger/Extender: Rudene ReBritto, Errol Weeks in Treatment: 2 Wound Status Wound Number: 1 Primary Etiology: Lymphedema Wound Location: Right Lower Leg - Lateral, Proximal Wound Status: Open Wounding Event: Gradually Appeared Comorbid History:  Lymphedema, Hypertension Date Acquired: 08/08/2017 Weeks Of Treatment: 2 Clustered Wound: No Photos Photo Uploaded By: Alejandro MullingPinkerton, Debra on 09/27/2017 09:19:43 Wound Measurements Length: (cm) 1.2 Width: (cm) 1.5 Depth: (cm) 0.1 Area: (cm) 1.414 Volume: (cm) 0.141 % Reduction in Area: 6.2% % Reduction in Volume: 53.3% Epithelialization: None Tunneling: No Undermining: No Wound Description Full Thickness With Exposed Support Classification: Structures Wound Margin: Flat and Intact Exudate Large Amount: Exudate Type: Serosanguineous Exudate Color: red, brown Foul Odor After Cleansing: Yes Due to Product Use: No Slough/Fibrino Yes Wound Bed Granulation Amount: Large (67-100%) Exposed Structure Granulation Quality: Red Fascia Exposed: No Necrotic Amount: Small (1-33%) Fat Layer (Subcutaneous Tissue) Exposed: Yes Necrotic Quality: Adherent Slough Tendon Exposed: No  Muscle Exposed: No Joint Exposed: No Bone Exposed: No Kerner, [name] ([id number]) Periwound Skin Texture Texture Color No Abnormalities Noted: No No Abnormalities Noted: No Callus: No Atrophie Blanche: No Crepitus: No Cyanosis: No Excoriation: No Ecchymosis: No Induration: No Erythema: No Rash: No Hemosiderin Staining: No Scarring: No Mottled: No Pallor: No Moisture Rubor: No No Abnormalities Noted: No Dry / Scaly: No Temperature / Pain Maceration: Yes Temperature: No Abnormality Wound Preparation Ulcer Cleansing: Rinsed/Irrigated with Saline, Other: soap and water, Treatment Notes Wound #1 (Right, Proximal, Lateral Lower Leg) 1. Cleansed with: Clean wound with Normal Saline Cleanse wound with antibacterial soap and water 3. Peri-wound Care: Barrier cream 4. Dressing Applied: Other dressing (specify in notes) 5. Secondary Dressing Applied ABD Pad 7. Secured with Tape 3 Layer Compression System - Right Lower Extremity Notes silvercel, charcoal, xtrasorb Electronic  Signature(s) Signed: 09/28/2017 4:36:47 PM By: Alejandro MullingPinkerton, Debra Entered By: Alejandro MullingPinkerton, Debra on 09/27/2017 08:15:08 Sample, Deolinda R. ([id number]) -------------------------------------------------------------------------------- Wound Assessment Details Patient Name: [name], Tiari R. Date of Service: 09/27/2017 8:00 AM Medical Record Number: [id number] Patient Account Number: [id number] Date of Birth/Sex: January 22, 1981 (36 y.o. Female) Treating RN: Ashok CordiaPinkerton, Debi Primary Care Claudio Mondry: PATIENT, NO Other Clinician: Referring Niclas Markell: Referral, Self Treating Jonas Goh/Extender: Rudene ReBritto, Errol Weeks in Treatment: 2 Wound Status Wound Number: 2 Primary Etiology: Lymphedema Wound Location: Right Lower Leg - Lateral, Distal Wound Status: Open Wounding Event: Gradually Appeared Comorbid History: Lymphedema, Hypertension Date Acquired: 08/08/2017 Weeks Of Treatment: 2 Clustered Wound: No Photos Photo Uploaded By: Alejandro MullingPinkerton, Debra on 09/27/2017 09:19:44 Wound Measurements Length: (cm) 4.7 Width: (cm) 8.5 Depth: (cm) 0.3 Area: (cm) 31.377 Volume: (cm) 9.413 % Reduction in Area: -46.3% % Reduction in Volume: -46.3% Epithelialization: None Tunneling: No Undermining: No Wound Description Full Thickness With Exposed Support Classification: Structures Wound Margin: Flat and Intact Exudate Large Amount: Exudate Type: Serosanguineous Exudate Color: red, brown Foul Odor After Cleansing: Yes Due to Product Use: No Slough/Fibrino Yes Wound Bed Granulation Amount: Small (1-33%) Exposed Structure Granulation Quality: Red Fascia Exposed: No Necrotic Amount: Large (67-100%) Fat Layer (Subcutaneous Tissue) Exposed: Yes Necrotic Quality: Adherent Slough Tendon Exposed: No Muscle Exposed: No Joint Exposed: No Bone Exposed: No Buege, Linet R. ([id number]) Periwound Skin Texture Texture Color No Abnormalities Noted: No No Abnormalities Noted: No Callus: No Atrophie Blanche: No Crepitus:  No Cyanosis: No Excoriation: No Ecchymosis: No Induration: No Erythema: No Rash: No Hemosiderin Staining: No Scarring: No Mottled: No Pallor: No Moisture Rubor: No No Abnormalities Noted: No Dry / Scaly: No Temperature / Pain Maceration: Yes Temperature: No Abnormality Wound Preparation Ulcer Cleansing: Rinsed/Irrigated with Saline, Other: soap and water, Treatment Notes Wound #2 (Right, Distal, Lateral Lower Leg) 1. Cleansed with: Clean wound with Normal Saline Cleanse wound with antibacterial soap and water 3. Peri-wound Care: Barrier cream 4. Dressing Applied: Other dressing (specify in notes) 5. Secondary Dressing Applied ABD Pad 7. Secured with Tape 3 Layer Compression System - Right Lower Extremity Notes silvercel, charcoal, xtrasorb Electronic Signature(s) Signed: 09/28/2017 4:36:47 PM By: Alejandro MullingPinkerton, Debra Entered By: Alejandro MullingPinkerton, Debra on 09/27/2017 08:16:31

## 2017-10-01 ENCOUNTER — Emergency Department (HOSPITAL_COMMUNITY)
Admission: EM | Admit: 2017-10-01 | Discharge: 2017-10-01 | Disposition: A | Discharge status: Home / Self Care | Payer: No Typology Code available for payment source | Attending: Emergency Medicine | Admitting: Emergency Medicine

## 2017-10-01 ENCOUNTER — Encounter (HOSPITAL_COMMUNITY): Payer: Self-pay | Admitting: Emergency Medicine

## 2017-10-01 ENCOUNTER — Emergency Department (HOSPITAL_COMMUNITY): Payer: No Typology Code available for payment source

## 2017-10-01 ENCOUNTER — Other Ambulatory Visit: Payer: Self-pay

## 2017-10-01 ENCOUNTER — Encounter: Payer: Self-pay | Admitting: Surgery

## 2017-10-01 DIAGNOSIS — Y929 Unspecified place or not applicable: Secondary | ICD-10-CM | POA: Diagnosis not present

## 2017-10-01 DIAGNOSIS — Y999 Unspecified external cause status: Secondary | ICD-10-CM | POA: Diagnosis not present

## 2017-10-01 DIAGNOSIS — Y9389 Activity, other specified: Secondary | ICD-10-CM | POA: Diagnosis not present

## 2017-10-01 DIAGNOSIS — S39012A Strain of muscle, fascia and tendon of lower back, initial encounter: Secondary | ICD-10-CM | POA: Diagnosis present

## 2017-10-01 DIAGNOSIS — I10 Essential (primary) hypertension: Secondary | ICD-10-CM | POA: Insufficient documentation
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
## 2017-10-01 NOTE — Discharge Instructions (Signed)
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
## 2017-10-02 NOTE — Progress Notes (Signed)
Jessica, [name] ([id number]) Visit Report for 10/01/2017 Chief Complaint Document Details Patient Name: [name], [name] Date of Service: 10/01/2017 2:45 PM Medical Record Number: [id number] Patient Account Number: [id number] Date of Birth/Sex: 06-07-1981 (36 y.o. Female) Treating RN: Primary Care Provider: PATIENT, NO Other Clinician: Referring Provider: Referral, Self Treating Provider/Extender: Rudene Re in Treatment: 3 Information Obtained from: Patient Chief Complaint Patient presents for treatment of an open ulcer due to venous insufficiency to the right lower extremity which she's had for about a month Electronic Signature(s) Signed: 10/01/2017 3:35:35 PM By: Evlyn Kanner MD, FACS Entered By: Evlyn Kanner on 10/01/2017 15:35:35 Wannamaker, Haidee R. ([id number]) -------------------------------------------------------------------------------- HPI Details Patient Name: Jessica Casey R. Date of Service: 10/01/2017 2:45 PM Medical Record Number: [id number] Patient Account Number: [id number] Date of Birth/Sex: [birth year] (36 y.o. Female) Treating RN: Primary Care Provider: PATIENT, NO Other Clinician: Referring Provider: Referral, Self Treating Provider/Extender: Rudene Re in Treatment: 3 History of Present Illness Location: right lower extremity lateral calf several large areas Quality: Patient reports experiencing a sharp pain to affected area(s). Severity: Patient states wound are getting worse. Duration: Patient has had the wound for < 5 weeks prior to presenting for treatment Timing: Pain in wound is Intermittent (comes and goes Context: The wound would happen gradually Modifying Factors: Other treatment(s) tried include:admission to the hospital for sepsis and a full workup with treatment with IV antibiotics and discharged on oral antibiotics Associated Signs and Symptoms: Patient reports having increase discharge. HPI Description: 36 year old patient well known  to our [facility] wound care clinic where she has been seen since 2016 for bilateral lower extremity venous insufficiency disease with lymphedema and multiple ulcerations associated with morbid obesity. she had custom-made compression stockings and lymphedema pumps which were used in the past. most recently she was admitted to the hospital between October 11 and 09/02/2017 with sepsis, lower extremity wounds and lymphedema.she was initially treated in the outpatient with Keflex and Bactrim. she was initially treated in the hospital with vancomycin and Zosyn and changed over to Unasyn until her white count improved and her blood cultures were negative for 3 days. After her inpatient management she was discharged home on Augmentin to end on 09/13/2017 with a 14 day course. she has had outpatient vascular duplex scans completed in November 2017 and her right ABI was 1.1 and the left ABI is 1.3. she had normal toe brachial indices bilaterally.she had three-vessel runoff in the right lower extremity and two-vessel runoff in the left lower extremity. On questioning the patient she does have custom made compression stockings and also has a lymphedema pump but has not been using it appropriately and has not been taking good care of herself. 09/17/2017 -- she returns today with compression stockings on the left side and the right side has had significant amount of drainage and has a very strong odor 09/24/2017 -- the drainage is increased significantly and she has more lymphedema and a very strong odor to her wound. Though she does not have systemic symptoms, or overt infection I believe she will benefit from some doxycycline given empirically. 10/01/2017 -- after starting the doxycycline and changing the dressing twice a week her symptoms and signs have definitely improved overall. Electronic Signature(s) Signed: 10/01/2017 3:36:07 PM By: Evlyn Kanner MD, FACS Entered By: Evlyn Kanner on 10/01/2017  15:36:07 [name] ([id number]) -------------------------------------------------------------------------------- Physical Exam Details Patient Name: Jessica Casey R. Date of Service: 10/01/2017 2:45 PM Medical Record Number: [id number] Patient Account Number: [id number] Date of  Birth/Sex: 10-18-1981 (36 y.o. Female) Treating RN: Primary Care Provider: PATIENT, NO Other Clinician: Referring Provider: Referral, Self Treating Provider/Extender: Rudene Re in Treatment: 3 Constitutional . Pulse regular. Respirations normal and unlabored. Afebrile. . Eyes Nonicteric. Reactive to light. Ears, Nose, Mouth, and Throat Lips, teeth, and gums WNL.Marland Kitchen Moist mucosa without lesions. Neck supple and nontender. No palpable supraclavicular or cervical adenopathy. Normal sized without goiter. Respiratory WNL. No retractions.. Cardiovascular Pedal Pulses WNL. No clubbing, cyanosis or edema. Lymphatic No adneopathy. No adenopathy. No adenopathy. Musculoskeletal Adexa without tenderness or enlargement.. Digits and nails w/o clubbing, cyanosis, infection, petechiae, ischemia, or inflammatory conditions.. Integumentary (Hair, Skin) No suspicious lesions. No crepitus or fluctuance. No peri-wound warmth or erythema. No masses.Marland Kitchen Psychiatric Judgement and insight Intact.. No evidence of depression, anxiety, or agitation.. Notes the lymphedema is looking much better today and the wound is cleaner with no evidence of superinfection and overall there has been improvement since last week. No sharp debridement was required but I did wash out the wounds profusely with moist saline gauze Electronic Signature(s) Signed: 10/01/2017 3:37:01 PM By: Evlyn Kanner MD, FACS Entered By: Evlyn Kanner on 10/01/2017 15:37:00 [name] ([id number]) -------------------------------------------------------------------------------- Physician Orders Details Patient Name: Jessica Casey R. Date of Service:  10/01/2017 2:45 PM Medical Record Number: [id number] Patient Account Number: [id number] Date of Birth/Sex: [birth year] (36 y.o. Female) Treating RN: Ashok Cordia, Debi Primary Care Provider: PATIENT, NO Other Clinician: Referring Provider: Referral, Self Treating Provider/Extender: Rudene Re in Treatment: 3 Verbal / Phone Orders: Yes Clinician: Pinkerton, Debi Read Back and Verified: Yes Diagnosis Coding Wound Cleansing Wound #1 Right,Proximal,Lateral Lower Leg o Cleanse wound with mild soap and water Wound #2 Right,Distal,Lateral Lower Leg o Cleanse wound with mild soap and water Anesthetic Wound #1 Right,Proximal,Lateral Lower Leg o Topical Lidocaine 4% cream applied to wound bed prior to debridement Wound #2 Right,Distal,Lateral Lower Leg o Topical Lidocaine 4% cream applied to wound bed prior to debridement Skin Barriers/Peri-Wound Care Wound #1 Right,Proximal,Lateral Lower Leg o Barrier cream o Moisturizing lotion Wound #2 Right,Distal,Lateral Lower Leg o Barrier cream o Moisturizing lotion Primary Wound Dressing Wound #1 Right,Proximal,Lateral Lower Leg o Silvercel Non-Adherent Wound #2 Right,Distal,Lateral Lower Leg o Silvercel Non-Adherent Secondary Dressing Wound #1 Right,Proximal,Lateral Lower Leg o ABD pad o XtraSorb o Other - carboflex Wound #2 Right,Distal,Lateral Lower Leg o ABD pad o XtraSorb o Other - carboflex Dressing Change Frequency Wound #1 Right,Proximal,Lateral Lower Leg Meder, Stephaniemarie R. ([id number]) o Dressing is to be changed Monday and Thursday. Wound #2 Right,Distal,Lateral Lower Leg o Dressing is to be changed Monday and Thursday. Follow-up Appointments Wound #1 Right,Proximal,Lateral Lower Leg o Return Appointment in 1 week. o Nurse Visit as needed - Thursday Wound #2 Right,Distal,Lateral Lower Leg o Return Appointment in 1 week. o Nurse Visit as needed - Thursday Edema Control Wound #1  Right,Proximal,Lateral Lower Leg o 3 Layer Compression System - Right Lower Extremity - unna to anchor Wound #2 Right,Distal,Lateral Lower Leg o 3 Layer Compression System - Right Lower Extremity - unna to anchor Additional Orders / Instructions Wound #1 Right,Proximal,Lateral Lower Leg o Increase protein intake. o Other: - Please watch your salt (sodium) intake Wound #2 Right,Distal,Lateral Lower Leg o Increase protein intake. o Other: - Please watch your salt (sodium) intake Medications-please add to medication list. Wound #1 Right,Proximal,Lateral Lower Leg o P.O. Antibiotics Wound #2 Right,Distal,Lateral Lower Leg o P.O. Antibiotics Electronic Signature(s) Signed: 10/01/2017 4:30:10 PM By: Evlyn Kanner MD, FACS Signed: 10/01/2017 5:35:20 PM By: [name] Entered  By: Alejandro MullingPinkerton, Debra on 10/01/2017 15:25:04 Sedore, Cristal DeerERICA R. ([id number]) -------------------------------------------------------------------------------- Problem List Details Patient Name: Jessica CaseyLEATH, Esta R. Date of Service: 10/01/2017 2:45 PM Medical Record Number: [id number] Patient Account Number: [id number] Date of Birth/Sex: December 16, 1980 (36 y.o. Female) Treating RN: Primary Care Provider: PATIENT, NO Other Clinician: Referring Provider: Referral, Self Treating Provider/Extender: Rudene ReBritto, Aadya Kindler Weeks in Treatment: 3 Active Problems ICD-10 Encounter Code Description Active Date Diagnosis L97.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d 09/10/2017 Yes I89.0 Lymphedema, not elsewhere classified 09/10/2017 Yes I87.311 Chronic venous hypertension (idiopathic) with ulcer of right lower 09/10/2017 Yes extremity E66.01 Morbid (severe) obesity due to excess calories 09/10/2017 Yes Inactive Problems Resolved Problems Electronic Signature(s) Signed: 10/01/2017 3:35:18 PM By: Evlyn KannerBritto, Castor Gittleman MD, FACS Entered By: Evlyn KannerBritto, Josyah Achor on 10/01/2017 15:35:18 Viloria, Tiffancy R.  ([id number]) -------------------------------------------------------------------------------- Progress Note Details Patient Name: Jessica CaseyLEATH, Miho R. Date of Service: 10/01/2017 2:45 PM Medical Record Number: [id number] Patient Account Number: [id number] Date of Birth/Sex: December 16, 1980 (36 y.o. Female) Treating RN: Primary Care Provider: PATIENT, NO Other Clinician: Referring Provider: Referral, Self Treating Provider/Extender: Rudene ReBritto, Brownie Gockel Weeks in Treatment: 3 Subjective Chief Complaint Information obtained from Patient Patient presents for treatment of an open ulcer due to venous insufficiency to the right lower extremity which she's had for about a month History of Present Illness (HPI) The following HPI elements were documented for the patient's wound: Location: right lower extremity lateral calf several large areas Quality: Patient reports experiencing a sharp pain to affected area(s). Severity: Patient states wound are getting worse. Duration: Patient has had the wound for < 5 weeks prior to presenting for treatment Timing: Pain in wound is Intermittent (comes and goes Context: The wound would happen gradually Modifying Factors: Other treatment(s) tried include:admission to the hospital for sepsis and a full workup with treatment with IV antibiotics and discharged on oral antibiotics Associated Signs and Symptoms: Patient reports having increase discharge. 36 [facility] she has been seen since 2016 for bilateral lower extremity venous insufficiency disease with lymphedema and multiple ulcerations associated with morbid obesity. she had custom-made compression stockings and lymphedema pumps which were used in the past. most recently she was admitted to the hospital between October 11 and 09/02/2017 with sepsis, lower extremity wounds and lymphedema.she was initially treated in the outpatient with Keflex and Bactrim. she was  initially treated in the hospital with vancomycin and Zosyn and changed over to Unasyn until her white count improved and her blood cultures were negative for 3 days. After her inpatient management she was discharged home on Augmentin to end on 09/13/2017 with a 14 day course. she has had outpatient vascular duplex scans completed in November 2017 and her right ABI was 1.1 and the left ABI is 1.3. she had normal toe brachial indices bilaterally.she had three-vessel runoff in the right lower extremity and two-vessel runoff in the left lower extremity. On questioning the patient she does have custom made compression stockings and also has a lymphedema pump but has not been using it appropriately and has not been taking good care of herself. 09/17/2017 -- she returns today with compression stockings on the left side and the right side has had significant amount of drainage and has a very strong odor 09/24/2017 -- the drainage is increased significantly and she has more lymphedema and a very strong odor to her wound. Though she does not have systemic symptoms, or overt infection I believe she will benefit from some doxycycline given  empirically. 10/01/2017 -- after starting the doxycycline and changing the dressing twice a week her symptoms and signs have definitely improved overall. Patient History Information obtained from Patient. DARA, BEIDLEMAN ([id number]) Family History Kidney Disease - Mother, No family history of Cancer, Diabetes, Heart Disease, Hereditary Spherocytosis, Hypertension, Lung Disease, Seizures, Stroke, Thyroid Problems, Tuberculosis. Social History Never smoker, Marital Status - Married, Alcohol Use - Never, Drug Use - No History, Caffeine Use - Daily. Objective Constitutional Pulse regular. Respirations normal and unlabored. Afebrile. Vitals Time Taken: 3:11 PM, Height: 74 in, Weight: 505 lbs, BMI: 64.8, Temperature: 98.3 F, Pulse: 97 bpm, Respiratory Rate: 20  breaths/min, Blood Pressure: 138/82 mmHg. Eyes Nonicteric. Reactive to light. Ears, Nose, Mouth, and Throat Lips, teeth, and gums WNL.Marland Kitchen Moist mucosa without lesions. Neck supple and nontender. No palpable supraclavicular or cervical adenopathy. Normal sized without goiter. Respiratory WNL. No retractions.. Cardiovascular Pedal Pulses WNL. No clubbing, cyanosis or edema. Lymphatic No adneopathy. No adenopathy. No adenopathy. Musculoskeletal Adexa without tenderness or enlargement.. Digits and nails w/o clubbing, cyanosis, infection, petechiae, ischemia, or inflammatory conditions.Marland Kitchen Psychiatric Judgement and insight Intact.. No evidence of depression, anxiety, or agitation.. General Notes: the lymphedema is looking much better today and the wound is cleaner with no evidence of superinfection and overall there has been improvement since last week. No sharp debridement was required but I did wash out the wounds profusely with moist saline gauze Integumentary (Hair, Skin) No suspicious lesions. No crepitus or fluctuance. No peri-wound warmth or erythema. No masses.Marland Kitchen Lindon, Claretha R. ([id number]) Wound #1 status is Open. Original cause of wound was Gradually Appeared. The wound is located on the Right,Proximal,Lateral Lower Leg. The wound measures 1.4cm length x 1.4cm width x 0.1cm depth; 1.539cm^2 area and 0.154cm^3 volume. There is Fat Layer (Subcutaneous Tissue) Exposed exposed. There is no tunneling or undermining noted. There is a large amount of serosanguineous drainage noted. Foul odor after cleansing was noted. The wound margin is flat and intact. There is large (67-100%) red granulation within the wound bed. There is a small (1-33%) amount of necrotic tissue within the wound bed including Adherent Slough. The periwound skin appearance exhibited: Maceration. The periwound skin appearance did not exhibit: Callus, Crepitus, Excoriation, Induration, Rash, Scarring, Dry/Scaly,  Atrophie Blanche, Cyanosis, Ecchymosis, Hemosiderin Staining, Mottled, Pallor, Rubor, Erythema. Periwound temperature was noted as No Abnormality. Wound #2 status is Open. Original cause of wound was Gradually Appeared. The wound is located on the Right,Distal,Lateral Lower Leg. The wound measures 4.4cm length x 8.4cm width x 0.3cm depth; 29.028cm^2 area and 8.708cm^3 volume. There is Fat Layer (Subcutaneous Tissue) Exposed exposed. There is no tunneling or undermining noted. There is a large amount of serosanguineous drainage noted. Foul odor after cleansing was noted. The wound margin is flat and intact. There is no granulation within the wound bed. There is a large (67-100%) amount of necrotic tissue within the wound bed including Adherent Slough. The periwound skin appearance exhibited: Maceration. The periwound skin appearance did not exhibit: Callus, Crepitus, Excoriation, Induration, Rash, Scarring, Dry/Scaly, Atrophie Blanche, Cyanosis, Ecchymosis, Hemosiderin Staining, Mottled, Pallor, Rubor, Erythema. Periwound temperature was noted as No Abnormality. Assessment Active Problems ICD-10 L97.212 - 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d I89.0 - Lymphedema, not elsewhere classified I87.311 - Chronic venous hypertension (idiopathic) with ulcer of right lower extremity E66.01 - Morbid (severe) obesity due to excess calories Plan Wound Cleansing: Wound #1 Right,Proximal,Lateral Lower Leg: Cleanse wound with mild soap and water Wound #2 Right,Distal,Lateral Lower Leg: Cleanse  wound with mild soap and water Anesthetic: Wound #1 Right,Proximal,Lateral Lower Leg: Topical Lidocaine 4% cream applied to wound bed prior to debridement Wound #2 Right,Distal,Lateral Lower Leg: Topical Lidocaine 4% cream applied to wound bed prior to debridement Skin Barriers/Peri-Wound Care: Wound #1 Right,Proximal,Lateral Lower Leg: Barrier cream Moisturizing lotion Wound #2  Right,Distal,Lateral Lower Leg: Barrier cream Moisturizing lotion Primary Wound Dressing: KELLSEY, SANSONE R. ([id number]) Wound #1 Right,Proximal,Lateral Lower Leg: Silvercel Non-Adherent Wound #2 Right,Distal,Lateral Lower Leg: Silvercel Non-Adherent Secondary Dressing: Wound #1 Right,Proximal,Lateral Lower Leg: ABD pad XtraSorb Other - carboflex Wound #2 Right,Distal,Lateral Lower Leg: ABD pad XtraSorb Other - carboflex Dressing Change Frequency: Wound #1 Right,Proximal,Lateral Lower Leg: Dressing is to be changed Monday and Thursday. Wound #2 Right,Distal,Lateral Lower Leg: Dressing is to be changed Monday and Thursday. Follow-up Appointments: Wound #1 Right,Proximal,Lateral Lower Leg: Return Appointment in 1 week. Nurse Visit as needed - Thursday Wound #2 Right,Distal,Lateral Lower Leg: Return Appointment in 1 week. Nurse Visit as needed - Thursday Edema Control: Wound #1 Right,Proximal,Lateral Lower Leg: 3 Layer Compression System - Right Lower Extremity - unna to anchor Wound #2 Right,Distal,Lateral Lower Leg: 3 Layer Compression System - Right Lower Extremity - unna to anchor Additional Orders / Instructions: Wound #1 Right,Proximal,Lateral Lower Leg: Increase protein intake. Other: - Please watch your salt (sodium) intake Wound #2 Right,Distal,Lateral Lower Leg: Increase protein intake. Other: - Please watch your salt (sodium) intake Medications-please add to medication list.: Wound #1 Right,Proximal,Lateral Lower Leg: P.O. Antibiotics Wound #2 Right,Distal,Lateral Lower Leg: P.O. Antibiotics that has been much improvement since the last week and after clinical examination and discussion with the patient I have recommended: 1. Silver alginate with a 3 layer compression wrap to the right lower extremity - we will also use some extrasorb and some carboflex. She will need a nurse visit during the week. 2. doxycycline 100 mg twice a day for 14 days 3. Use her  custom built compression stockings of the left lower extremity 4. Lymphedema pumps to be utilized twice a day for an hour each 5. Elevation and exercises been discussed with her in great detail 6. Adequate protein, vitamin A, vitamin C and zinc 7. I have discussed her diet and the need to watch her salt and fluid intake and she says she will be careful Chevez, Anelise R. ([id number]) 8. Regular visits to the wound center Electronic Signature(s) Signed: 10/01/2017 3:37:56 PM By: Evlyn Kanner MD, FACS Entered By: Evlyn Kanner on 10/01/2017 15:37:55 [name] ([id number]) -------------------------------------------------------------------------------- ROS/PFSH Details Patient Name: Jessica Casey R. Date of Service: 10/01/2017 2:45 PM Medical Record Number: [id number] Patient Account Number: [id number] Date of Birth/Sex: August 20, 1981 (36 y.o. Female) Treating RN: Primary Care Provider: PATIENT, NO Other Clinician: Referring Provider: Referral, Self Treating Provider/Extender: Rudene Re in Treatment: 3 Information Obtained From Patient Wound History Do you currently have one or more open woundso Yes How many open wounds do you currently haveo 1 Approximately how long have you had your woundso 1 month How have you been treating your wound(s) until nowo aquacel ag Has your wound(s) ever healed and then re-openedo No Have you had any lab work done in the past montho No Have you tested positive for an antibiotic resistant organism (MRSA, VRE)o No Have you tested positive for osteomyelitis (bone infection)o No Have you had any tests for circulation on your legso Yes Who ordered the testo [facility] Where was the test doneo [facility] Hematologic/Lymphatic Medical History: Positive for: Lymphedema Respiratory Medical History: Negative for: Aspiration; Asthma;  Chronic Obstructive Pulmonary Disease (COPD); Pneumothorax; Sleep Apnea; Tuberculosis Cardiovascular Medical History: Positive  for: Hypertension Negative for: Angina; Arrhythmia; Congestive Heart Failure; Coronary Artery Disease; Deep Vein Thrombosis; Hypotension; Myocardial Infarction; Peripheral Arterial Disease; Peripheral Venous Disease; Phlebitis; Vasculitis Immunizations Pneumococcal Vaccine: Received Pneumococcal Vaccination: No Immunization Notes: up to date Implantable Devices Family and Social History Cancer: No; Diabetes: No; Heart Disease: No; Hereditary Spherocytosis: No; Hypertension: No; Kidney Disease: Yes - Mother; Lung Disease: No; Seizures: No; Stroke: No; Thyroid Problems: No; Tuberculosis: No; Never smoker; Marital Status - Married; Alcohol Use: Never; Drug Use: No History; Caffeine Use: Daily; Financial Concerns: No; Food, Clothing or Shelter Needs: No; Support System Lacking: No; Transportation Concerns: No; Advanced Directives: No; Patient does not want information on Advanced Directives Marlaine HindLEATH, Floriene R. ([id number]) Physician Affirmation I have reviewed and agree with the above information. Electronic Signature(s) Signed: 10/01/2017 4:30:10 PM By: Evlyn KannerBritto, Etheline Geppert MD, FACS Entered By: Evlyn KannerBritto, Toney Difatta on 10/01/2017 15:36:18 Marlaine HindLEATH, Sheniya R. ([id number]) -------------------------------------------------------------------------------- SuperBill Details Patient Name: Jessica CaseyLEATH, Kandra R. Date of Service: 10/01/2017 Medical Record Number: [id number] Patient Account Number: [id number] Date of Birth/Sex: 07/16/1981 (36 y.o. Female) Treating RN: Primary Care Provider: PATIENT, NO Other Clinician: Referring Provider: Referral, Self Treating Provider/Extender: Rudene ReBritto, Meghna Hagmann Weeks in Treatment: 3 Diagnosis Coding ICD-10 Codes Code Description 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d I89.0 Lymphedema, not elsewhere classified I87.311 Chronic venous hypertension (idiopathic) with ulcer of right lower extremity E66.01 Morbid (severe) obesity due to excess calories Facility  Procedures CPT4 Code Description: [id number] (Facility Use Only) [id number] - APPLY MULTLAY COMPRS LWR RT LEG Modifier: Quantity: 1 Physician Procedures CPT4 Code: [id number] Description: 99213 - WC PHYS LEVEL 3 - EST PT ICD-10 Diagnosis Description L97.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 I89.0 Lymphedema, not elsewhere classified I87.311 Chronic venous hypertension (idiopathic) with ulcer of  right l E66.01 Morbid (severe) obesity due to excess calories Modifier: d ower extremity Quantity: 1 Electronic Signature(s) Signed: 10/01/2017 5:13:32 PM By: Alejandro MullingPinkerton, Debra Previous Signature: 10/01/2017 3:38:29 PM Version By: Evlyn KannerBritto, Culley Hedeen MD, FACS Entered By: Alejandro MullingPinkerton, Debra on 10/01/2017 17:13:32

## 2017-10-04 ENCOUNTER — Ambulatory Visit: Payer: Self-pay

## 2017-10-04 NOTE — Progress Notes (Signed)
Jessica, [name] ([id number]) Visit Report for 10/01/2017 Arrival Information Details Patient Name: [name], [name] Date of Service: 10/01/2017 2:45 PM Medical Record Number: [id number] Patient Account Number: [id number] Date of Birth/Sex: 31-Dec-1980 (36 y.o. Female) Treating RN: Ashok Cordia, Debi Primary Care Wallace Gappa: PATIENT, NO Other Clinician: Referring Konstantine Gervasi: Referral, Self Treating Ronnika Collett/Extender: Rudene Re in Treatment: 3 Visit Information History Since Last Visit All ordered tests and consults were completed: No Patient Arrived: Ambulatory Added or deleted any medications: No Arrival Time: 15:01 Any new allergies or adverse reactions: No Accompanied By: self Had a fall or experienced change in No Transfer Assistance: None activities of daily living that may affect Patient Identification Verified: Yes risk of falls: Secondary Verification Process Completed: Yes Signs or symptoms of abuse/neglect since last visito No Patient Requires Transmission-Based No Hospitalized since last visit: No Precautions: Has Dressing in Place as Prescribed: Yes Patient Has Alerts: No Has Compression in Place as Prescribed: Yes Pain Present Now: No Electronic Signature(s) Signed: 10/01/2017 5:35:20 PM By: [name] Entered By: [name] on 10/01/2017 15:03:55 Rede, Aleksis R. ([id number]) -------------------------------------------------------------------------------- Encounter Discharge Information Details Patient Name: Jessica Casey R. Date of Service: 10/01/2017 2:45 PM Medical Record Number: [id number] Patient Account Number: [id number] Date of Birth/Sex: 09/02/1981 (36 y.o. Female) Treating RN: Ashok Cordia, Debi Primary Care Kalyn Dimattia: PATIENT, NO Other Clinician: Referring Arnitra Sokoloski: Referral, Self Treating Taunja Brickner/Extender: Rudene Re in Treatment: 3 Encounter Discharge Information Items Discharge Pain Level: 0 Discharge Condition:  Stable Ambulatory Status: Ambulatory Discharge Destination: Home Transportation: Private Auto Accompanied By: self Schedule Follow-up Appointment: Yes Medication Reconciliation completed and No provided to Patient/Care Sybol Morre: Provided on Clinical Summary of Care: 10/01/2017 Form Type Recipient Paper Patient EL Electronic Signature(s) Signed: 10/03/2017 11:35:39 AM By: Gwenlyn Perking Entered By: Gwenlyn Perking on 10/01/2017 15:42:47 Erlandson, Ramani R. ([id number]) -------------------------------------------------------------------------------- Lower Extremity Assessment Details Patient Name: Jessica Casey R. Date of Service: 10/01/2017 2:45 PM Medical Record Number: [id number] Patient Account Number: [id number] Date of Birth/Sex: 1981-07-10 (36 y.o. Female) Treating RN: Ashok Cordia, Debi Primary Care Bergen Magner: PATIENT, NO Other Clinician: Referring Abe Schools: Referral, Self Treating Kylii Ennis/Extender: Rudene Re in Treatment: 3 Edema Assessment Assessed: [Left: No] [Right: No] [Left: Edema] [Right: :] Calf Left: Right: Point of Measurement: 35 cm From Medial Instep cm 65.7 cm Ankle Left: Right: Point of Measurement: 9 cm From Medial Instep cm 42 cm Vascular Assessment Pulses: Dorsalis Pedis Palpable: [Right:Yes] Doppler Audible: [Right:Yes] Posterior Tibial Extremity colors, hair growth, and conditions: Extremity Color: [Right:Hyperpigmented] Temperature of Extremity: [Right:Warm] Capillary Refill: [Right:< 3 seconds] Toe Nail Assessment Left: Right: Thick: Yes Discolored: Yes Deformed: Yes Improper Length and Hygiene: Yes Electronic Signature(s) Signed: 10/01/2017 5:35:20 PM By: [name] Entered By: [name] on 10/01/2017 15:16:46 Maddix, Ardella R. ([id number]) -------------------------------------------------------------------------------- Multi Wound Chart Details Patient Name: Jessica Casey R. Date of Service: 10/01/2017 2:45 PM Medical  Record Number: [id number] Patient Account Number: [id number] Date of Birth/Sex: 08/02/1981 (36 y.o. Female) Treating RN: Ashok Cordia, Debi Primary Care Murriel Eidem: PATIENT, NO Other Clinician: Referring Celestine Prim: Referral, Self Treating Kaydn Kumpf/Extender: Rudene Re in Treatment: 3 Vital Signs Height(in): 74 Pulse(bpm): 97 Weight(lbs): 505 Blood Pressure(mmHg): 138/82 Body Mass Index(BMI): 65 Temperature(F): 98.3 Respiratory Rate 20 (breaths/min): Photos: [1:No Photos] [2:No Photos] [N/A:N/A] Wound Location: [1:Right Lower Leg - Lateral, Proximal] [2:Right Lower Leg - Lateral, Distal] [N/A:N/A] Wounding Event: [1:Gradually Appeared] [2:Gradually Appeared] [N/A:N/A] Primary Etiology: [1:Lymphedema] [2:Lymphedema] [N/A:N/A] Comorbid History: [1:Lymphedema, Hypertension] [2:Lymphedema, Hypertension] [N/A:N/A] Date Acquired: [1:08/08/2017] [2:08/08/2017] [N/A:N/A] Weeks of Treatment: [1:3] [2:3] [N/A:N/A] Wound Status: [1:Open] [  2:Open] [N/A:N/A] Measurements L x W x D [1:1.4x1.4x0.1] [2:4.4x8.4x0.3] [N/A:N/A] (cm) Area (cm) : [1:1.539] [2:29.028] [N/A:N/A] Volume (cm) : [1:0.154] [2:8.708] [N/A:N/A] % Reduction in Area: [1:-2.10%] [2:-35.40%] [N/A:N/A] % Reduction in Volume: [1:49.00%] [2:-35.40%] [N/A:N/A] Classification: [1:Full Thickness With Exposed Support Structures] [2:Full Thickness With Exposed Support Structures] [N/A:N/A] Exudate Amount: [1:Large] [2:Large] [N/A:N/A] Exudate Type: [1:Serosanguineous] [2:Serosanguineous] [N/A:N/A] Exudate Color: [1:red, brown] [2:red, brown] [N/A:N/A] Foul Odor After Cleansing: [1:Yes] [2:Yes] [N/A:N/A] Odor Anticipated Due to [1:No] [2:No] [N/A:N/A] Product Use: Wound Margin: [1:Flat and Intact] [2:Flat and Intact] [N/A:N/A] Granulation Amount: [1:Large (67-100%)] [2:None Present (0%)] [N/A:N/A] Granulation Quality: [1:Red] [2:N/A] [N/A:N/A] Necrotic Amount: [1:Small (1-33%)] [2:Large (67-100%)] [N/A:N/A] Exposed  Structures: [1:Fat Layer (Subcutaneous Tissue) Exposed: Yes Fascia: No Tendon: No Muscle: No Joint: No Bone: No] [2:Fat Layer (Subcutaneous Tissue) Exposed: Yes Fascia: No Tendon: No Muscle: No Joint: No Bone: No] [N/A:N/A] Epithelialization: [1:None] [2:None] [N/A:N/A] Periwound Skin Texture: [1:Excoriation: No Induration: No] [2:Excoriation: No Induration: No] [N/A:N/A] Callus: No Callus: No Crepitus: No Crepitus: No Rash: No Rash: No Scarring: No Scarring: No Periwound Skin Moisture: Maceration: Yes Maceration: Yes N/A Dry/Scaly: No Dry/Scaly: No Periwound Skin Color: Atrophie Blanche: No Atrophie Blanche: No N/A Cyanosis: No Cyanosis: No Ecchymosis: No Ecchymosis: No Erythema: No Erythema: No Hemosiderin Staining: No Hemosiderin Staining: No Mottled: No Mottled: No Pallor: No Pallor: No Rubor: No Rubor: No Temperature: No Abnormality No Abnormality N/A Tenderness on Palpation: No No N/A Wound Preparation: Ulcer Cleansing: Ulcer Cleansing: N/A Rinsed/Irrigated with Saline, Rinsed/Irrigated with Saline, Other: soap and water Other: soap and water Topical Anesthetic Applied: Topical Anesthetic Applied: Other: lidocaine 4% Other: lidocaine 4% Treatment Notes Electronic Signature(s) Signed: 10/01/2017 3:35:29 PM By: Evlyn KannerBritto, Errol MD, FACS Entered By: Evlyn KannerBritto, Errol on 10/01/2017 15:35:29 Marlaine HindLEATH, Gelsey R. ([id number]) -------------------------------------------------------------------------------- Multi-Disciplinary Care Plan Details Patient Name: Jessica CaseyLEATH, Eloina R. Date of Service: 10/01/2017 2:45 PM Medical Record Number: [id number] Patient Account Number: [id number] Date of Birth/Sex: 07/08/1981 (36 y.o. Female) Treating RN: Ashok CordiaPinkerton, Debi Primary Care Jahmiyah Dullea: PATIENT, NO Other Clinician: Referring Hayzel Ruberg: Referral, Self Treating Haruna Rohlfs/Extender: Rudene ReBritto, Errol Weeks in Treatment: 3 Active Inactive ` Orientation to the Wound Care Program Nursing  Diagnoses: Knowledge deficit related to the wound healing center program Goals: Patient/caregiver will verbalize understanding of the Wound Healing Center Program Date Initiated: 09/10/2017 Target Resolution Date: 11/23/2017 Goal Status: Active Interventions: Provide education on orientation to the wound center Notes: ` Venous Leg Ulcer Nursing Diagnoses: Knowledge deficit related to disease process and management Goals: Patient/caregiver will verbalize understanding of disease process and disease management Date Initiated: 09/10/2017 Target Resolution Date: 11/23/2017 Goal Status: Active Interventions: Assess peripheral edema status every visit. Notes: ` Wound/Skin Impairment Nursing Diagnoses: Impaired tissue integrity Goals: Ulcer/skin breakdown will heal within 14 weeks Date Initiated: 09/10/2017 Target Resolution Date: 11/24/2017 Goal Status: Active Interventions: Marlaine HindLEATH, Merlene R. ([id number]) Assess patient/caregiver ability to obtain necessary supplies Assess patient/caregiver ability to perform ulcer/skin care regimen upon admission and as needed Assess ulceration(s) every visit Notes: Electronic Signature(s) Signed: 10/01/2017 5:35:20 PM By: Alejandro MullingPinkerton, Debra Entered By: Alejandro MullingPinkerton, Debra on 10/01/2017 15:17:00 Baller, Dalesha R. ([id number]) -------------------------------------------------------------------------------- Pain Assessment Details Patient Name: Jessica CaseyLEATH, Layla R. Date of Service: 10/01/2017 2:45 PM Medical Record Number: [id number] Patient Account Number: [id number] Date of Birth/Sex: 07/08/1981 (36 y.o. Female) Treating RN: Ashok CordiaPinkerton, Debi Primary Care Damara Klunder: PATIENT, NO Other Clinician: Referring Zaylei Mullane: Referral, Self Treating Kathryne Ramella/Extender: Rudene ReBritto, Errol Weeks in Treatment: 3 Active Problems Location of Pain Severity and Description of Pain Patient Has Paino No Site Locations Pain Management and  Medication Current Pain Management: Electronic  Signature(s) Signed: 10/01/2017 5:35:20 PM By: Alejandro MullingPinkerton, Debra Entered By: Alejandro MullingPinkerton, Debra on 10/01/2017 15:04:08 Marlaine HindLEATH, Calisha R. ([id number]) -------------------------------------------------------------------------------- Patient/Caregiver Education Details Patient Name: Jessica CaseyLEATH, Maridel R. Date of Service: 10/01/2017 2:45 PM Medical Record Number: [id number] Patient Account Number: [id number] Date of Birth/Gender: [birth year] (36 y.o. Female) Treating RN: Ashok CordiaPinkerton, Debi Primary Care Physician: PATIENT, NO Other Clinician: Referring Physician: Referral, Self Treating Physician/Extender: Rudene ReBritto, Errol Weeks in Treatment: 3 Education Assessment Education Provided To: Patient Education Topics Provided Wound/Skin Impairment: Handouts: Other: do not get wrap wet Methods: Demonstration, Explain/Verbal Responses: State content correctly Electronic Signature(s) Signed: 10/01/2017 5:35:20 PM By: Alejandro MullingPinkerton, Debra Entered By: Alejandro MullingPinkerton, Debra on 10/01/2017 15:23:24 Shroff, Alethia R. ([id number]) -------------------------------------------------------------------------------- Wound Assessment Details Patient Name: Jessica CaseyLEATH, Quanasia R. Date of Service: 10/01/2017 2:45 PM Medical Record Number: [id number] Patient Account Number: [id number] Date of Birth/Sex: [birth year] (36 y.o. Female) Treating RN: Ashok CordiaPinkerton, Debi Primary Care Eleora Sutherland: PATIENT, NO Other Clinician: Referring Nataliah Hatlestad: Referral, Self Treating Joseluis Alessio/Extender: Rudene ReBritto, Errol Weeks in Treatment: 3 Wound Status Wound Number: 1 Primary Etiology: Lymphedema Wound Location: Right Lower Leg - Lateral, Proximal Wound Status: Open Wounding Event: Gradually Appeared Comorbid History: Lymphedema, Hypertension Date Acquired: 08/08/2017 Weeks Of Treatment: 3 Clustered Wound: No Photos Photo Uploaded By: Alejandro MullingPinkerton, Debra on 10/02/2017 08:08:30 Wound Measurements Length: (cm) 1.4 Width: (cm) 1.4 Depth: (cm) 0.1 Area: (cm)  1.539 Volume: (cm) 0.154 % Reduction in Area: -2.1% % Reduction in Volume: 49% Epithelialization: None Tunneling: No Undermining: No Wound Description Full Thickness With Exposed Support Classification: Structures Wound Margin: Flat and Intact Exudate Large Amount: Exudate Type: Serosanguineous Exudate Color: red, brown Foul Odor After Cleansing: Yes Due to Product Use: No Slough/Fibrino Yes Wound Bed Granulation Amount: Large (67-100%) Exposed Structure Granulation Quality: Red Fascia Exposed: No Necrotic Amount: Small (1-33%) Fat Layer (Subcutaneous Tissue) Exposed: Yes Necrotic Quality: Adherent Slough Tendon Exposed: No Muscle Exposed: No Joint Exposed: No Bone Exposed: No Desanto, Careen R. ([id number]) Periwound Skin Texture Texture Color No Abnormalities Noted: No No Abnormalities Noted: No Callus: No Atrophie Blanche: No Crepitus: No Cyanosis: No Excoriation: No Ecchymosis: No Induration: No Erythema: No Rash: No Hemosiderin Staining: No Scarring: No Mottled: No Pallor: No Moisture Rubor: No No Abnormalities Noted: No Dry / Scaly: No Temperature / Pain Maceration: Yes Temperature: No Abnormality Wound Preparation Ulcer Cleansing: Rinsed/Irrigated with Saline, Other: soap and water, Topical Anesthetic Applied: Other: lidocaine 4%, Treatment Notes Wound #1 (Right, Proximal, Lateral Lower Leg) 1. Cleansed with: Clean wound with Normal Saline Cleanse wound with antibacterial soap and water 2. Anesthetic Topical Lidocaine 4% cream to wound bed prior to debridement 3. Peri-wound Care: Barrier cream 4. Dressing Applied: Other dressing (specify in notes) 5. Secondary Dressing Applied ABD Pad Dry Gauze 7. Secured with Tape 3 Layer Compression System - Right Lower Extremity Notes silvercel, xtrasorb, unna to Ecologistanchor Electronic Signature(s) Signed: 10/01/2017 5:35:20 PM By: Alejandro MullingPinkerton, Debra Entered By: Alejandro MullingPinkerton, Debra on 10/01/2017  15:11:51 Panozzo, Merissa R. ([id number]) -------------------------------------------------------------------------------- Wound Assessment Details Patient Name: Jessica CaseyLEATH, Haddy R. Date of Service: 10/01/2017 2:45 PM Medical Record Number: [id number] Patient Account Number: [id number] Date of Birth/Sex: [birth year] (36 y.o. Female) Treating RN: Ashok CordiaPinkerton, Debi Primary Care Roby Donaway: PATIENT, NO Other Clinician: Referring Hiya Point: Referral, Self Treating Zorion Nims/Extender: Rudene ReBritto, Errol Weeks in Treatment: 3 Wound Status Wound Number: 2 Primary Etiology: Lymphedema Wound Location: Right Lower Leg - Lateral, Distal Wound Status: Open Wounding Event: Gradually Appeared Comorbid History: Lymphedema, Hypertension Date Acquired: 08/08/2017 Weeks Of Treatment: 3 Clustered Wound: No Photos  Photo Uploaded By: [name] on 10/02/2017 08:08:49 Wound Measurements Length: (cm) 4.4 Width: (cm) 8.4 Depth: (cm) 0.3 Area: (cm) 29.028 Volume: (cm) 8.708 % Reduction in Area: -35.4% % Reduction in Volume: -35.4% Epithelialization: None Tunneling: No Undermining: No Wound Description Full Thickness With Exposed Support Classification: Structures Wound Margin: Flat and Intact Exudate Large Amount: Exudate Type: Serosanguineous Exudate Color: red, brown Foul Odor After Cleansing: Yes Due to Product Use: No Slough/Fibrino Yes Wound Bed Granulation Amount: None Present (0%) Exposed Structure Necrotic Amount: Large (67-100%) Fascia Exposed: No Necrotic Quality: Adherent Slough Fat Layer (Subcutaneous Tissue) Exposed: Yes Tendon Exposed: No Muscle Exposed: No Joint Exposed: No Bone Exposed: No Shvartsman, Ellison R. ([id number]) Periwound Skin Texture Texture Color No Abnormalities Noted: No No Abnormalities Noted: No Callus: No Atrophie Blanche: No Crepitus: No Cyanosis: No Excoriation: No Ecchymosis: No Induration: No Erythema: No Rash: No Hemosiderin Staining: No Scarring:  No Mottled: No Pallor: No Moisture Rubor: No No Abnormalities Noted: No Dry / Scaly: No Temperature / Pain Maceration: Yes Temperature: No Abnormality Wound Preparation Ulcer Cleansing: Rinsed/Irrigated with Saline, Other: soap and water, Topical Anesthetic Applied: Other: lidocaine 4%, Treatment Notes Wound #2 (Right, Distal, Lateral Lower Leg) 1. Cleansed with: Clean wound with Normal Saline Cleanse wound with antibacterial soap and water 2. Anesthetic Topical Lidocaine 4% cream to wound bed prior to debridement 3. Peri-wound Care: Barrier cream 4. Dressing Applied: Other dressing (specify in notes) 5. Secondary Dressing Applied ABD Pad Dry Gauze 7. Secured with Tape 3 Layer Compression System - Right Lower Extremity Notes silvercel, xtrasorb, unna to Ecologist) Signed: 10/01/2017 5:35:20 PM By: [name] Entered By: [name] on 10/01/2017 15:14:54 Norton, Chalon R. ([id number]) -------------------------------------------------------------------------------- Vitals Details Patient Name: Jessica Casey R. Date of Service: 10/01/2017 2:45 PM Medical Record Number: [id number] Patient Account Number: [id number] Date of Birth/Sex: 1981/06/30 (36 y.o. Female) Treating RN: Ashok Cordia, Debi Primary Care Nasri Boakye: PATIENT, NO Other Clinician: Referring Darreon Lutes: Referral, Self Treating Netha Dafoe/Extender: Rudene Re in Treatment: 3 Vital Signs Time Taken: 15:11 Temperature (F): 98.3 Height (in): 74 Pulse (bpm): 97 Weight (lbs): 505 Respiratory Rate (breaths/min): 20 Body Mass Index (BMI): 64.8 Blood Pressure (mmHg): 138/82 Reference Range: 80 - 120 mg / dl Electronic Signature(s) Signed: 10/01/2017 5:35:20 PM By: [name] Entered By: [name] on 10/01/2017 15:11:40

## 2017-10-08 ENCOUNTER — Encounter: Payer: Self-pay | Admitting: Surgery

## 2017-10-09 NOTE — Progress Notes (Signed)
Jessica, SCHECTER ([id number]) Visit Report for 10/08/2017 Arrival Information Details Patient Name: [name], [name] Date of Service: 10/08/2017 8:15 AM Medical Record Number: [id number] Patient Account Number: [id number] Date of Birth/Sex: [birth year] (36 y.o. Female) Treating RN: Renne Crigler Primary Care Sam Wunschel: PATIENT, NO Other Clinician: Referring Lilibeth Opie: Referral, Self Treating Jarom Govan/Extender: Rudene Re in Treatment: 4 Visit Information History Since Last Visit All ordered tests and consults were completed: No Patient Arrived: Ambulatory Added or deleted any medications: No Arrival Time: 08:17 Any new allergies or adverse reactions: No Accompanied By: self Had a fall or experienced change in No Transfer Assistance: None activities of daily living that may affect Patient Identification Verified: Yes risk of falls: Secondary Verification Process Completed: Yes Signs or symptoms of abuse/neglect since last visito No Patient Requires Transmission-Based No Hospitalized since last visit: No Precautions: Pain Present Now: No Patient Has Alerts: No Electronic Signature(s) Signed: 10/08/2017 4:01:21 PM By: Renne Crigler Entered By: Renne Crigler on 10/08/2017 08:20:47 Danner, Cristal Deer ([id number]) -------------------------------------------------------------------------------- Encounter Discharge Information Details Patient Name: Jessica Casey R. Date of Service: 10/08/2017 8:15 AM Medical Record Number: [id number] Patient Account Number: [id number] Date of Birth/Sex: [birth year] (35 y.o. Female) Treating RN: Renne Crigler Primary Care Jackee Glasner: PATIENT, NO Other Clinician: Referring Ahilyn Nell: Referral, Self Treating Jaxxon Naeem/Extender: Rudene Re in Treatment: 4 Encounter Discharge Information Items Discharge Pain Level: 0 Discharge Condition: Stable Ambulatory Status: Ambulatory Discharge Destination: Home Transportation: Private  Auto Accompanied By: self Schedule Follow-up Appointment: Yes Medication Reconciliation completed and No provided to Patient/Care Kendrik Mcshan: Provided on Clinical Summary of Care: 10/08/2017 Form Type Recipient Paper Patient EL Electronic Signature(s) Signed: 10/09/2017 8:35:27 AM By: Gwenlyn Perking Entered By: Gwenlyn Perking on 10/08/2017 09:06:06 Sandora, Belvie R. ([id number]) -------------------------------------------------------------------------------- Lower Extremity Assessment Details Patient Name: Jessica Casey R. Date of Service: 10/08/2017 8:15 AM Medical Record Number: [id number] Patient Account Number: [id number] Date of Birth/Sex: 05-08-1981 (36 y.o. Female) Treating RN: Renne Crigler Primary Care Ilyas Lipsitz: PATIENT, NO Other Clinician: Referring Nomie Buchberger: Referral, Self Treating Mackinzee Roszak/Extender: Rudene Re in Treatment: 4 Edema Assessment Assessed: [Left: No] [Right: No] Edema: [Left: Ye] [Right: s] Vascular Assessment Claudication: Claudication Assessment [Right:None] Pulses: Posterior Tibial Extremity colors, hair growth, and conditions: Extremity Color: [Right:Normal] Hair Growth on Extremity: [Right:No] Temperature of Extremity: [Right:Warm] Capillary Refill: [Right:< 3 seconds] Toe Nail Assessment Left: Right: Thick: Yes Discolored: Yes Deformed: Yes Improper Length and Hygiene: Yes Electronic Signature(s) Signed: 10/08/2017 4:01:21 PM By: Renne Crigler Entered By: Renne Crigler on 10/08/2017 08:41:48 Pha, Ellorie R. ([id number]) -------------------------------------------------------------------------------- Multi Wound Chart Details Patient Name: Jessica Casey R. Date of Service: 10/08/2017 8:15 AM Medical Record Number: [id number] Patient Account Number: [id number] Date of Birth/Sex: [birth year] (36 y.o. Female) Treating RN: Renne Crigler Primary Care Kimla Furth: PATIENT, NO Other Clinician: Referring Mavis Fichera: Referral,  Self Treating Enisa Runyan/Extender: Rudene Re in Treatment: 4 Vital Signs Height(in): 74 Pulse(bpm): 109 Weight(lbs): 505 Blood Pressure(mmHg): 159/100 Body Mass Index(BMI): 65 Temperature(F): 98.1 Respiratory Rate 20 (breaths/min): Photos: [1:No Photos] [2:No Photos] [N/A:N/A] Wound Location: [1:Right Lower Leg - Lateral, Proximal] [2:Right Lower Leg - Lateral, Distal] [N/A:N/A] Wounding Event: [1:Gradually Appeared] [2:Gradually Appeared] [N/A:N/A] Primary Etiology: [1:Lymphedema] [2:Lymphedema] [N/A:N/A] Comorbid History: [1:Lymphedema, Hypertension] [2:Lymphedema, Hypertension] [N/A:N/A] Date Acquired: [1:08/08/2017] [2:08/08/2017] [N/A:N/A] Weeks of Treatment: [1:4] [2:4] [N/A:N/A] Wound Status: [1:Open] [2:Open] [N/A:N/A] Measurements L x W x D [1:1x1.4x0.1] [2:4.4x8.2x0.3] [N/A:N/A] (cm) Area (cm) : [1:1.1] [2:28.337] [N/A:N/A] Volume (cm) : [1:0.11] [2:8.501] [N/A:N/A] % Reduction in Area: [1:27.10%] [2:-32.20%] [N/A:N/A] % Reduction in Volume: [1:63.60%] [2:-32.20%] [N/A:N/A] Classification: [  1:Full Thickness With Exposed Support Structures] [2:Full Thickness With Exposed Support Structures] [N/A:N/A] Exudate Amount: [1:Large] [2:Large] [N/A:N/A] Exudate Type: [1:Serosanguineous] [2:Serosanguineous] [N/A:N/A] Exudate Color: [1:red, brown] [2:red, brown] [N/A:N/A] Foul Odor After Cleansing: [1:Yes] [2:Yes] [N/A:N/A] Odor Anticipated Due to [1:No] [2:No] [N/A:N/A] Product Use: Wound Margin: [1:Flat and Intact] [2:Flat and Intact] [N/A:N/A] Granulation Amount: [1:Large (67-100%)] [2:None Present (0%)] [N/A:N/A] Granulation Quality: [1:Red] [2:N/A] [N/A:N/A] Necrotic Amount: [1:Small (1-33%)] [2:Large (67-100%)] [N/A:N/A] Necrotic Tissue: [1:Adherent Slough] [2:Eschar, Adherent Slough] [N/A:N/A] Exposed Structures: [1:Fat Layer (Subcutaneous Tissue) Exposed: Yes Fascia: No Tendon: No Muscle: No Joint: No Bone: No] [2:Fat Layer (Subcutaneous Tissue) Exposed: Yes  Fascia: No Tendon: No Muscle: No Joint: No Bone: No] [N/A:N/A] Epithelialization: [1:None] [2:None] [N/A:N/A] Debridement: [1:N/A] [2:Debridement (11042-11047)] [N/A:N/A] Pre-procedure N/A 08:45 N/A Verification/Time Out Taken: Pain Control: N/A Other N/A Tissue Debrided: N/A Necrotic/Eschar, N/A Fibrin/Slough, Subcutaneous Level: N/A Skin/Subcutaneous Tissue N/A Debridement Area (sq cm): N/A 36.08 N/A Instrument: N/A Curette N/A Bleeding: N/A Minimum N/A Hemostasis Achieved: N/A Pressure N/A Procedural Pain: N/A 0 N/A Post Procedural Pain: N/A 0 N/A Debridement Treatment N/A Procedure was tolerated well N/A Response: Post Debridement N/A 4.4x8.2x0.3 N/A Measurements L x W x D (cm) Post Debridement Volume: N/A 8.501 N/A (cm) Periwound Skin Texture: Excoriation: No Excoriation: No N/A Induration: No Induration: No Callus: No Callus: No Crepitus: No Crepitus: No Rash: No Rash: No Scarring: No Scarring: No Periwound Skin Moisture: Maceration: Yes Maceration: Yes N/A Dry/Scaly: No Dry/Scaly: No Periwound Skin Color: Atrophie Blanche: No Atrophie Blanche: No N/A Cyanosis: No Cyanosis: No Ecchymosis: No Ecchymosis: No Erythema: No Erythema: No Hemosiderin Staining: No Hemosiderin Staining: No Mottled: No Mottled: No Pallor: No Pallor: No Rubor: No Rubor: No Temperature: No Abnormality No Abnormality N/A Tenderness on Palpation: No No N/A Wound Preparation: Ulcer Cleansing: Ulcer Cleansing: N/A Rinsed/Irrigated with Saline, Rinsed/Irrigated with Saline, Other: soap and water Other: soap and water Topical Anesthetic Applied: Topical Anesthetic Applied: Other: lidocaine 4% Other: lidocaine 4% Procedures Performed: N/A Debridement N/A Treatment Notes Wound #1 (Right, Proximal, Lateral Lower Leg) 1. Cleansed with: Clean wound with Normal Saline 2. Anesthetic Topical Lidocaine 4% cream to wound bed prior to debridement 4. Dressing Applied: Other  dressing (specify in notes) 5. Secondary Dressing Applied ABD Pad 7. Secured with Marcin, Mystery R. ([id number]) 3 Layer Compression System - Right Lower Extremity Notes silvercell and xtrasorb Wound #2 (Right, Distal, Lateral Lower Leg) 1. Cleansed with: Clean wound with Normal Saline 2. Anesthetic Topical Lidocaine 4% cream to wound bed prior to debridement 4. Dressing Applied: Other dressing (specify in notes) 5. Secondary Dressing Applied ABD Pad 7. Secured with 3 Layer Compression System - Right Lower Extremity Notes silvercell and xtrasorb Electronic Signature(s) Signed: 10/08/2017 9:10:29 AM By: Evlyn KannerBritto, Errol MD, FACS Entered By: Evlyn KannerBritto, Errol on 10/08/2017 09:10:29 Marlaine HindLEATH, Fatimah R. ([id number]) -------------------------------------------------------------------------------- Multi-Disciplinary Care Plan Details Patient Name: Jessica CaseyLEATH, Bless R. Date of Service: 10/08/2017 8:15 AM Medical Record Number: [id number] Patient Account Number: [id number] Date of Birth/Sex: 1980-11-24 (36 y.o. Female) Treating RN: Renne CriglerFlinchum, Cheryl Primary Care Etienne Mowers: PATIENT, NO Other Clinician: Referring Florean Hoobler: Referral, Self Treating Brodey Bonn/Extender: Rudene ReBritto, Errol Weeks in Treatment: 4 Active Inactive ` Orientation to the Wound Care Program Nursing Diagnoses: Knowledge deficit related to the wound healing center program Goals: Patient/caregiver will verbalize understanding of the Wound Healing Center Program Date Initiated: 09/10/2017 Target Resolution Date: 11/23/2017 Goal Status: Active Interventions: Provide education on orientation to the wound center Notes: ` Venous Leg Ulcer Nursing Diagnoses: Knowledge deficit related to disease process and  management Goals: Patient/caregiver will verbalize understanding of disease process and disease management Date Initiated: 09/10/2017 Target Resolution Date: 11/23/2017 Goal Status: Active Interventions: Assess peripheral edema status  every visit. Notes: ` Wound/Skin Impairment Nursing Diagnoses: Impaired tissue integrity Goals: Ulcer/skin breakdown will heal within 14 weeks Date Initiated: 09/10/2017 Target Resolution Date: 11/24/2017 Goal Status: Active Interventions: JLEE, HARKLESS ([id number]) Assess patient/caregiver ability to obtain necessary supplies Assess patient/caregiver ability to perform ulcer/skin care regimen upon admission and as needed Assess ulceration(s) every visit Notes: Electronic Signature(s) Signed: 10/08/2017 4:01:21 PM By: Renne Crigler Entered By: Renne Crigler on 10/08/2017 08:43:32 Firman, Katharin R. ([id number]) -------------------------------------------------------------------------------- Pain Assessment Details Patient Name: Jessica Casey R. Date of Service: 10/08/2017 8:15 AM Medical Record Number: [id number] Patient Account Number: [id number] Date of Birth/Sex: 04/25/1981 (36 y.o. Female) Treating RN: Renne Crigler Primary Care Desmon Hitchner: PATIENT, NO Other Clinician: Referring Porcia Morganti: Referral, Self Treating Lamica Mccart/Extender: Rudene Re in Treatment: 4 Active Problems Location of Pain Severity and Description of Pain Patient Has Paino No Site Locations Pain Management and Medication Current Pain Management: Electronic Signature(s) Signed: 10/08/2017 4:01:21 PM By: Renne Crigler Entered By: Renne Crigler on 10/08/2017 08:20:55 Polgar, Cristal Deer ([id number]) -------------------------------------------------------------------------------- Patient/Caregiver Education Details Patient Name: Jessica Casey R. Date of Service: 10/08/2017 8:15 AM Medical Record Number: [id number] Patient Account Number: [id number] Date of Birth/Gender: 11-01-1981 (36 y.o. Female) Treating RN: Renne Crigler Primary Care Physician: PATIENT, NO Other Clinician: Referring Physician: Referral, Self Treating Physician/Extender: Rudene Re in Treatment: 4 Education  Assessment Education Provided To: Patient Education Topics Provided Wound Debridement: Handouts: Wound Debridement Responses: State content correctly Wound/Skin Impairment: Handouts: Caring for Your Ulcer Methods: Explain/Verbal Responses: State content correctly Electronic Signature(s) Signed: 10/08/2017 4:01:21 PM By: Renne Crigler Entered By: Renne Crigler on 10/08/2017 09:04:39 Ruddell, Kaaren R. ([id number]) -------------------------------------------------------------------------------- Wound Assessment Details Patient Name: Jessica Casey R. Date of Service: 10/08/2017 8:15 AM Medical Record Number: [id number] Patient Account Number: [id number] Date of Birth/Sex: 01/27/1981 (36 y.o. Female) Treating RN: Renne Crigler Primary Care Arav Bannister: PATIENT, NO Other Clinician: Referring Draylon Mercadel: Referral, Self Treating Deborrah Mabin/Extender: Rudene Re in Treatment: 4 Wound Status Wound Number: 1 Primary Etiology: Lymphedema Wound Location: Right Lower Leg - Lateral, Proximal Wound Status: Open Wounding Event: Gradually Appeared Comorbid History: Lymphedema, Hypertension Date Acquired: 08/08/2017 Weeks Of Treatment: 4 Clustered Wound: No Photos Wound Measurements Length: (cm) 1 Width: (cm) 1.4 Depth: (cm) 0.1 Area: (cm) 1.1 Volume: (cm) 0.11 % Reduction in Area: 27.1% % Reduction in Volume: 63.6% Epithelialization: None Tunneling: No Undermining: No Wound Description Full Thickness With Exposed Support Classification: Structures Wound Margin: Flat and Intact Exudate Large Amount: Exudate Type: Serosanguineous Exudate Color: red, brown Foul Odor After Cleansing: Yes Due to Product Use: No Slough/Fibrino Yes Wound Bed Granulation Amount: Large (67-100%) Exposed Structure Granulation Quality: Red Fascia Exposed: No Necrotic Amount: Small (1-33%) Fat Layer (Subcutaneous Tissue) Exposed: Yes Necrotic Quality: Adherent Slough Tendon Exposed:  No Muscle Exposed: No Joint Exposed: No Bone Exposed: No Periwound Skin Texture Woodell, Cataleyah R. ([id number]) Texture Color No Abnormalities Noted: No No Abnormalities Noted: No Callus: No Atrophie Blanche: No Crepitus: No Cyanosis: No Excoriation: No Ecchymosis: No Induration: No Erythema: No Rash: No Hemosiderin Staining: No Scarring: No Mottled: No Pallor: No Moisture Rubor: No No Abnormalities Noted: No Dry / Scaly: No Temperature / Pain Maceration: Yes Temperature: No Abnormality Wound Preparation Ulcer Cleansing: Rinsed/Irrigated with Saline, Other: soap and water, Topical Anesthetic Applied: Other: lidocaine 4%, Treatment Notes Wound #1 (Right, Proximal, Lateral Lower Leg) 1.  Cleansed with: Clean wound with Normal Saline 2. Anesthetic Topical Lidocaine 4% cream to wound bed prior to debridement 4. Dressing Applied: Other dressing (specify in notes) 5. Secondary Dressing Applied ABD Pad 7. Secured with 3 Layer Compression System - Right Lower Extremity Notes silvercell and xtrasorb Electronic Signature(s) Signed: 10/08/2017 4:01:21 PM By: Renne CriglerFlinchum, Cheryl Entered By: Renne CriglerFlinchum, Cheryl on 10/08/2017 09:13:23 Naugle, Khristin R. ([id number]) -------------------------------------------------------------------------------- Wound Assessment Details Patient Name: Jessica CaseyLEATH, Miriam R. Date of Service: 10/08/2017 8:15 AM Medical Record Number: [id number] Patient Account Number: [id number] Date of Birth/Sex: 08-20-1981 (36 y.o. Female) Treating RN: Renne CriglerFlinchum, Cheryl Primary Care Laconda Basich: PATIENT, NO Other Clinician: Referring Schylar Wuebker: Referral, Self Treating Ollis Daudelin/Extender: Rudene ReBritto, Errol Weeks in Treatment: 4 Wound Status Wound Number: 2 Primary Etiology: Lymphedema Wound Location: Right Lower Leg - Lateral, Distal Wound Status: Open Wounding Event: Gradually Appeared Comorbid History: Lymphedema, Hypertension Date Acquired: 08/08/2017 Weeks Of Treatment:  4 Clustered Wound: No Photos Wound Measurements Length: (cm) 4.4 Width: (cm) 8.2 Depth: (cm) 0.3 Area: (cm) 28.337 Volume: (cm) 8.501 % Reduction in Area: -32.2% % Reduction in Volume: -32.2% Epithelialization: None Tunneling: No Undermining: No Wound Description Full Thickness With Exposed Support Foul Odo Classification: Structures Due to P Wound Margin: Flat and Intact Slough/F Exudate Large Amount: Exudate Type: Serosanguineous Exudate Color: red, brown r After Cleansing: Yes roduct Use: No ibrino Yes Wound Bed Granulation Amount: None Present (0%) Exposed Structure Necrotic Amount: Large (67-100%) Fascia Exposed: No Necrotic Quality: Eschar, Adherent Slough Fat Layer (Subcutaneous Tissue) Exposed: Yes Tendon Exposed: No Muscle Exposed: No Joint Exposed: No Bone Exposed: No Periwound Skin Texture Gutt, Morene R. ([id number]) Texture Color No Abnormalities Noted: No No Abnormalities Noted: No Callus: No Atrophie Blanche: No Crepitus: No Cyanosis: No Excoriation: No Ecchymosis: No Induration: No Erythema: No Rash: No Hemosiderin Staining: No Scarring: No Mottled: No Pallor: No Moisture Rubor: No No Abnormalities Noted: No Dry / Scaly: No Temperature / Pain Maceration: Yes Temperature: No Abnormality Wound Preparation Ulcer Cleansing: Rinsed/Irrigated with Saline, Other: soap and water, Topical Anesthetic Applied: Other: lidocaine 4%, Treatment Notes Wound #2 (Right, Distal, Lateral Lower Leg) 1. Cleansed with: Clean wound with Normal Saline 2. Anesthetic Topical Lidocaine 4% cream to wound bed prior to debridement 4. Dressing Applied: Other dressing (specify in notes) 5. Secondary Dressing Applied ABD Pad 7. Secured with 3 Layer Compression System - Right Lower Extremity Notes silvercell and xtrasorb Electronic Signature(s) Signed: 10/08/2017 4:01:21 PM By: Renne CriglerFlinchum, Cheryl Entered By: Renne CriglerFlinchum, Cheryl on 10/08/2017  09:13:45 Purtle, Sashia R. ([id number]) -------------------------------------------------------------------------------- Vitals Details Patient Name: Jessica CaseyLEATH, Gwendalyn R. Date of Service: 10/08/2017 8:15 AM Medical Record Number: [id number] Patient Account Number: [id number] Date of Birth/Sex: 08-20-1981 (36 y.o. Female) Treating RN: Renne CriglerFlinchum, Cheryl Primary Care Keiaira Donlan: PATIENT, NO Other Clinician: Referring Pariss Hommes: Referral, Self Treating Braelon Sprung/Extender: Rudene ReBritto, Errol Weeks in Treatment: 4 Vital Signs Time Taken: 08:20 Temperature (F): 98.1 Height (in): 74 Pulse (bpm): 109 Weight (lbs): 505 Respiratory Rate (breaths/min): 20 Body Mass Index (BMI): 64.8 Blood Pressure (mmHg): 159/100 Reference Range: 80 - 120 mg / dl Electronic Signature(s) Signed: 10/08/2017 4:01:21 PM By: Renne CriglerFlinchum, Cheryl Entered By: Renne CriglerFlinchum, Cheryl on 10/08/2017 53:66:4408:21:23

## 2017-10-09 NOTE — Progress Notes (Signed)
[name], [place] R. ([id number]) Visit Report for 10/08/2017 Chief Complaint Document Details Patient Name: [name], [name] Date of Service: 10/08/2017 8:15 AM Medical Record Number: [id number] Patient Account Number: [id number] Date of Birth/Sex: 1981-04-22 (36 y.o. Female) Treating RN: Huel CoventryWoody, Kim Primary Care Provider: PATIENT, NO Other Clinician: Referring Provider: Referral, Self Treating Provider/Extender: Rudene ReBritto, Jalisia Puchalski Weeks in Treatment: 4 Information Obtained from: Patient Chief Complaint Patient presents for treatment of an open ulcer due to venous insufficiency to the right lower extremity which she's had for about a month Electronic Signature(s) Signed: 10/08/2017 9:10:48 AM By: Evlyn KannerBritto, Karcyn Menn MD, FACS Entered By: Evlyn KannerBritto, Shamond Skelton on 10/08/2017 09:10:47 Schlick, Desarai R. ([id number]) -------------------------------------------------------------------------------- Debridement Details Patient Name: [name], [name] Date of Service: 10/08/2017 8:15 AM Medical Record Number: [id number] Patient Account Number: [id number] Date of Birth/Sex: 1981-04-22 (36 y.o. Female) Treating RN: Huel CoventryWoody, Kim Primary Care Provider: PATIENT, NO Other Clinician: Referring Provider: Referral, Self Treating Provider/Extender: Rudene ReBritto, Jarnell Cordaro Weeks in Treatment: 4 Debridement Performed for Wound #2 Right,Distal,Lateral Lower Leg Assessment: Performed By: Physician Evlyn KannerBritto, Lynsey Ange, MD Debridement: Debridement Pre-procedure Verification/Time Yes - 08:45 Out Taken: Start Time: 08:46 Pain Control: Other : lidocaine 4% Level: Skin/Subcutaneous Tissue Total Area Debrided (L x W): 4.4 (cm) x 8.2 (cm) = 36.08 (cm) Tissue and other material Viable, Non-Viable, Eschar, Fibrin/Slough, Subcutaneous debrided: Instrument: Curette Bleeding: Minimum Hemostasis Achieved: Pressure End Time: 08:47 Procedural Pain: 0 Post Procedural Pain: 0 Response to Treatment: Procedure was tolerated well Post Debridement  Measurements of Total Wound Length: (cm) 4.4 Width: (cm) 8.2 Depth: (cm) 0.3 Volume: (cm) 8.501 Character of Wound/Ulcer Post Debridement: Stable Post Procedure Diagnosis Same as Pre-procedure Electronic Signature(s) Signed: 10/08/2017 9:10:41 AM By: Evlyn KannerBritto, Kandice Schmelter MD, FACS Signed: 10/08/2017 1:42:06 PM By: Elliot GurneyWoody, BSN, RN, CWS, Kim RN, BSN Entered By: Evlyn KannerBritto, Faron Tudisco on 10/08/2017 09:10:41 Avedisian, Arlyss R. ([id number]) -------------------------------------------------------------------------------- HPI Details Patient Name: [name], [name] Date of Service: 10/08/2017 8:15 AM Medical Record Number: [id number] Patient Account Number: [id number] Date of Birth/Sex: 1981-04-22 (36 y.o. Female) Treating RN: Huel CoventryWoody, Kim Primary Care Provider: PATIENT, NO Other Clinician: Referring Provider: Referral, Self Treating Provider/Extender: Rudene ReBritto, Anette Barra Weeks in Treatment: 4 History of Present Illness Location: right lower extremity lateral calf several large areas Quality: Patient reports experiencing a sharp pain to affected area(s). Severity: Patient states wound are getting worse. Duration: Patient has had the wound for < 5 weeks prior to presenting for treatment Timing: Pain in wound is Intermittent (comes and goes Context: The wound would happen gradually Modifying Factors: Other treatment(s) tried include:admission to the hospital for sepsis and a full workup with treatment with IV antibiotics and discharged on oral antibiotics Associated Signs and Symptoms: Patient reports having increase discharge. HPI Description: 36 year [facility] she has been seen since 2016 for bilateral lower extremity venous insufficiency disease with lymphedema and multiple ulcerations associated with morbid obesity. she had custom-made compression stockings and lymphedema pumps which were used in the past. most recently she was admitted to the hospital between  October 11 and 09/02/2017 with sepsis, lower extremity wounds and lymphedema.she was initially treated in the outpatient with Keflex and Bactrim. she was initially treated in the hospital with vancomycin and Zosyn and changed over to Unasyn until her white count improved and her blood cultures were negative for 3 days. After her inpatient management she was discharged home on Augmentin to end on 09/13/2017 with a 14 day course. she has had outpatient vascular duplex scans completed in November 2017  and her right ABI was 1.1 and the left ABI is 1.3. she had normal toe brachial indices bilaterally.she had three-vessel runoff in the right lower extremity and two-vessel runoff in the left lower extremity. On questioning the patient she does have custom made compression stockings and also has a lymphedema pump but has not been using it appropriately and has not been taking good care of herself. 09/17/2017 -- she returns today with compression stockings on the left side and the right side has had significant amount of drainage and has a very strong odor 09/24/2017 -- the drainage is increased significantly and she has more lymphedema and a very strong odor to her wound. Though she does not have systemic symptoms, or overt infection I believe she will benefit from some doxycycline given empirically. 10/01/2017 -- after starting the doxycycline and changing the dressing twice a week her symptoms and signs have definitely improved overall. 10/08/2017 -- she has completed her course of doxycycline but continues to have a lot of drainage and needs twice a week dressing changes. Electronic Signature(s) Signed: 10/08/2017 9:11:16 AM By: Evlyn KannerBritto, Shiann Kam MD, FACS Entered By: Evlyn KannerBritto, Samoria Fedorko on 10/08/2017 09:11:16 [name], [name] ([id number]) -------------------------------------------------------------------------------- Physical Exam Details Patient Name: [name], [name] Date of Service: 10/08/2017 8:15  AM Medical Record Number: [id number] Patient Account Number: [id number] Date of Birth/Sex: 1981/06/12 (36 y.o. Female) Treating RN: Huel CoventryWoody, Kim Primary Care Provider: PATIENT, NO Other Clinician: Referring Provider: Referral, Self Treating Provider/Extender: Rudene ReBritto, Electra Paladino Weeks in Treatment: 4 Constitutional . Pulse regular. Respirations normal and unlabored. Afebrile. . Eyes Nonicteric. Reactive to light. Ears, Nose, Mouth, and Throat Lips, teeth, and gums WNL.Marland Kitchen. Moist mucosa without lesions. Neck supple and nontender. No palpable supraclavicular or cervical adenopathy. Normal sized without goiter. Respiratory WNL. No retractions.. Cardiovascular Pedal Pulses WNL. No clubbing, cyanosis or edema. Lymphatic No adneopathy. No adenopathy. No adenopathy. Musculoskeletal Adexa without tenderness or enlargement.. Digits and nails w/o clubbing, cyanosis, infection, petechiae, ischemia, or inflammatory conditions.. Integumentary (Hair, Skin) No suspicious lesions. No crepitus or fluctuance. No peri-wound warmth or erythema. No masses.Marland Kitchen. Psychiatric Judgement and insight Intact.. No evidence of depression, anxiety, or agitation.. Notes the wound needed sharp debridement and both the large wounds had a lot of subcutaneous Deniz debris which was sharply removed with a #3 curet and minimal bleeding controlled with pressure Electronic Signature(s) Signed: 10/08/2017 9:12:00 AM By: Evlyn KannerBritto, Keith Felten MD, FACS Entered By: Evlyn KannerBritto, Mecca Guitron on 10/08/2017 09:12:00 [name], [name] ([id number]) -------------------------------------------------------------------------------- Physician Orders Details Patient Name: [name], [name] Date of Service: 10/08/2017 8:15 AM Medical Record Number: [id number] Patient Account Number: [id number] Date of Birth/Sex: 1981/06/12 (36 y.o. Female) Treating RN: Renne CriglerFlinchum, Cheryl Primary Care Provider: PATIENT, NO Other Clinician: Referring Provider: Referral, Self Treating  Provider/Extender: Rudene ReBritto, Digna Countess Weeks in Treatment: 4 Verbal / Phone Orders: No Diagnosis Coding Wound Cleansing Wound #1 Right,Proximal,Lateral Lower Leg o Cleanse wound with mild soap and water Wound #2 Right,Distal,Lateral Lower Leg o Cleanse wound with mild soap and water Anesthetic Wound #1 Right,Proximal,Lateral Lower Leg o Topical Lidocaine 4% cream applied to wound bed prior to debridement Wound #2 Right,Distal,Lateral Lower Leg o Topical Lidocaine 4% cream applied to wound bed prior to debridement Skin Barriers/Peri-Wound Care Wound #1 Right,Proximal,Lateral Lower Leg o Barrier cream o Moisturizing lotion Wound #2 Right,Distal,Lateral Lower Leg o Barrier cream o Moisturizing lotion Primary Wound Dressing Wound #1 Right,Proximal,Lateral Lower Leg o Silvercel Non-Adherent Wound #2 Right,Distal,Lateral Lower Leg o Silvercel Non-Adherent Secondary Dressing Wound #1 Right,Proximal,Lateral Lower Leg o ABD pad o  XtraSorb o Other - carboflex Wound #2 Right,Distal,Lateral Lower Leg o ABD pad o XtraSorb o Other - carboflex Dressing Change Frequency Wound #1 Right,Proximal,Lateral Lower Leg Skog, Karter R. ([id number]) o Dressing is to be changed Monday and Thursday. Wound #2 Right,Distal,Lateral Lower Leg o Dressing is to be changed Monday and Thursday. Follow-up Appointments Wound #1 Right,Proximal,Lateral Lower Leg o Return Appointment in 1 week. o Nurse Visit as needed - Thursday Wound #2 Right,Distal,Lateral Lower Leg o Return Appointment in 1 week. - make appt this wednesday for nurse wrap and next monday evening for nurse wrap Edema Control Wound #1 Right,Proximal,Lateral Lower Leg o 3 Layer Compression System - Right Lower Extremity - unna to anchor Wound #2 Right,Distal,Lateral Lower Leg o 3 Layer Compression System - Right Lower Extremity - unna to anchor Additional Orders / Instructions Wound #1  Right,Proximal,Lateral Lower Leg o Increase protein intake. o Other: - Please watch your salt (sodium) intake Wound #2 Right,Distal,Lateral Lower Leg o Increase protein intake. o Other: - Please watch your salt (sodium) intake Medications-please add to medication list. Wound #1 Right,Proximal,Lateral Lower Leg o P.O. Antibiotics Wound #2 Right,Distal,Lateral Lower Leg o P.O. Antibiotics Patient Medications Allergies: No Known Allergies Notifications Medication Indication Start End doxycycline hyclate 10/08/2017 DOSE oral 100 mg capsule - capsule oral bid Electronic Signature(s) Signed: 10/08/2017 9:10:08 AM By: Evlyn Kanner MD, FACS Entered By: Evlyn Kanner on 10/08/2017 09:10:07 [name] ([id number]) -------------------------------------------------------------------------------- Problem List Details Patient Name: Jessica Casey R. Date of Service: 10/08/2017 8:15 AM Medical Record Number: [id number] Patient Account Number: [id number] Date of Birth/Sex: 30-Oct-1981 (36 y.o. Female) Treating RN: Huel Coventry Primary Care Provider: PATIENT, NO Other Clinician: Referring Provider: Referral, Self Treating Provider/Extender: Rudene Re in Treatment: 4 Active Problems ICD-10 Encounter Code Description Active Date Diagnosis L97.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d 09/10/2017 Yes I89.0 Lymphedema, not elsewhere classified 09/10/2017 Yes I87.311 Chronic venous hypertension (idiopathic) with ulcer of right lower 09/10/2017 Yes extremity E66.01 Morbid (severe) obesity due to excess calories 09/10/2017 Yes Inactive Problems Resolved Problems Electronic Signature(s) Signed: 10/08/2017 9:10:23 AM By: Evlyn Kanner MD, FACS Entered By: Evlyn Kanner on 10/08/2017 09:10:23 Schwebach, Cristal Deer ([id number]) -------------------------------------------------------------------------------- Progress Note Details Patient Name: Jessica Casey  R. Date of Service: 10/08/2017 8:15 AM Medical Record Number: [id number] Patient Account Number: [id number] Date of Birth/Sex: 1981/03/19 (36 y.o. Female) Treating RN: Huel Coventry Primary Care Provider: PATIENT, NO Other Clinician: Referring Provider: Referral, Self Treating Provider/Extender: Rudene Re in Treatment: 4 Subjective Chief Complaint Information obtained from Patient Patient presents for treatment of an open ulcer due to venous insufficiency to the right lower extremity which she's had for about a month History of Present Illness (HPI) The following HPI elements were documented for the patient's wound: Location: right lower extremity lateral calf several large areas Quality: Patient reports experiencing a sharp pain to affected area(s). Severity: Patient states wound are getting worse. Duration: Patient has had the wound for < 5 weeks prior to presenting for treatment Timing: Pain in wound is Intermittent (comes and goes Context: The wound would happen gradually Modifying Factors: Other treatment(s) tried include:admission to the hospital for sepsis and a full workup with treatment with IV antibiotics and discharged on oral antibiotics Associated Signs and Symptoms: Patient reports having increase discharge. 36 year old patient well known to our [facility] wound care clinic where she has been seen since 2016 for bilateral lower extremity venous insufficiency disease with lymphedema and multiple ulcerations associated with morbid obesity. she  had custom-made compression stockings and lymphedema pumps which were used in the past. most recently she was admitted to the hospital between October 11 and 09/02/2017 with sepsis, lower extremity wounds and lymphedema.she was initially treated in the outpatient with Keflex and Bactrim. she was initially treated in the hospital with vancomycin and Zosyn and changed over to Unasyn until her white count improved and her blood  cultures were negative for 3 days. After her inpatient management she was discharged home on Augmentin to end on 09/13/2017 with a 14 day course. she has had outpatient vascular duplex scans completed in November 2017 and her right ABI was 1.1 and the left ABI is 1.3. she had normal toe brachial indices bilaterally.she had three-vessel runoff in the right lower extremity and two-vessel runoff in the left lower extremity. On questioning the patient she does have custom made compression stockings and also has a lymphedema pump but has not been using it appropriately and has not been taking good care of herself. 09/17/2017 -- she returns today with compression stockings on the left side and the right side has had significant amount of drainage and has a very strong odor 09/24/2017 -- the drainage is increased significantly and she has more lymphedema and a very strong odor to her wound. Though she does not have systemic symptoms, or overt infection I believe she will benefit from some doxycycline given empirically. 10/01/2017 -- after starting the doxycycline and changing the dressing twice a week her symptoms and signs have definitely improved overall. 10/08/2017 -- she has completed her course of doxycycline but continues to have a lot of drainage and needs twice a week dressing changes. [name], [name] ([id number]) Patient History Information obtained from Patient. Family History Kidney Disease - Mother, No family history of Cancer, Diabetes, Heart Disease, Hereditary Spherocytosis, Hypertension, Lung Disease, Seizures, Stroke, Thyroid Problems, Tuberculosis. Social History Never smoker, Marital Status - Married, Alcohol Use - Never, Drug Use - No History, Caffeine Use - Daily. Objective Constitutional Pulse regular. Respirations normal and unlabored. Afebrile. Vitals Time Taken: 8:20 AM, Height: 74 in, Weight: 505 lbs, BMI: 64.8, Temperature: 98.1 F, Pulse: 109 bpm, Respiratory Rate:  20 breaths/min, Blood Pressure: 159/100 mmHg. Eyes Nonicteric. Reactive to light. Ears, Nose, Mouth, and Throat Lips, teeth, and gums WNL.Marland Kitchen Moist mucosa without lesions. Neck supple and nontender. No palpable supraclavicular or cervical adenopathy. Normal sized without goiter. Respiratory WNL. No retractions.. Cardiovascular Pedal Pulses WNL. No clubbing, cyanosis or edema. Lymphatic No adneopathy. No adenopathy. No adenopathy. Musculoskeletal Adexa without tenderness or enlargement.. Digits and nails w/o clubbing, cyanosis, infection, petechiae, ischemia, or inflammatory conditions.Marland Kitchen Psychiatric Judgement and insight Intact.. No evidence of depression, anxiety, or agitation.. General Notes: the wound needed sharp debridement and both the large wounds had a lot of subcutaneous Deniz debris which was sharply removed with a #3 curet and minimal bleeding controlled with pressure Integumentary (Hair, Skin) Barrales, Mersadez R. ([id number]) No suspicious lesions. No crepitus or fluctuance. No peri-wound warmth or erythema. No masses.. Wound #1 status is Open. Original cause of wound was Gradually Appeared. The wound is located on the Right,Proximal,Lateral Lower Leg. The wound measures 1cm length x 1.4cm width x 0.1cm depth; 1.1cm^2 area and 0.11cm^3 volume. There is Fat Layer (Subcutaneous Tissue) Exposed exposed. There is no tunneling or undermining noted. There is a large amount of serosanguineous drainage noted. Foul odor after cleansing was noted. The wound margin is flat and intact. There is large (67-100%) red granulation within the wound bed. There is  a small (1-33%) amount of necrotic tissue within the wound bed including Adherent Slough. The periwound skin appearance exhibited: Maceration. The periwound skin appearance did not exhibit: Callus, Crepitus, Excoriation, Induration, Rash, Scarring, Dry/Scaly, Atrophie Blanche, Cyanosis, Ecchymosis, Hemosiderin Staining, Mottled, Pallor,  Rubor, Erythema. Periwound temperature was noted as No Abnormality. Wound #2 status is Open. Original cause of wound was Gradually Appeared. The wound is located on the Right,Distal,Lateral Lower Leg. The wound measures 4.4cm length x 8.2cm width x 0.3cm depth; 28.337cm^2 area and 8.501cm^3 volume. There is Fat Layer (Subcutaneous Tissue) Exposed exposed. There is no tunneling or undermining noted. There is a large amount of serosanguineous drainage noted. Foul odor after cleansing was noted. The wound margin is flat and intact. There is no granulation within the wound bed. There is a large (67-100%) amount of necrotic tissue within the wound bed including Eschar and Adherent Slough. The periwound skin appearance exhibited: Maceration. The periwound skin appearance did not exhibit: Callus, Crepitus, Excoriation, Induration, Rash, Scarring, Dry/Scaly, Atrophie Blanche, Cyanosis, Ecchymosis, Hemosiderin Staining, Mottled, Pallor, Rubor, Erythema. Periwound temperature was noted as No Abnormality. Assessment Active Problems ICD-10 L97.212 - 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d I89.0 - Lymphedema, not elsewhere classified I87.311 - Chronic venous hypertension (idiopathic) with ulcer of right lower extremity E66.01 - Morbid (severe) obesity due to excess calories Procedures Wound #2 Pre-procedure diagnosis of Wound #2 is a Lymphedema located on the Right,Distal,Lateral Lower Leg . There was a Skin/Subcutaneous Tissue Debridement ([id number]) debridement with total area of 36.08 sq cm performed by Evlyn Kanner, MD. with the following instrument(s): Curette to remove Viable and Non-Viable tissue/material including Fibrin/Slough, Eschar, and Subcutaneous after achieving pain control using Other (lidocaine 4%). A time out was conducted at 08:45, prior to the start of the procedure. A Minimum amount of bleeding was controlled with Pressure. The procedure was tolerated  well with a pain level of 0 throughout and a pain level of 0 following the procedure. Post Debridement Measurements: 4.4cm length x 8.2cm width x 0.3cm depth; 8.501cm^3 volume. Character of Wound/Ulcer Post Debridement is stable. Post procedure Diagnosis Wound #2: Same as Pre-Procedure Plan [name], [name] ([id number]) Wound Cleansing: Wound #1 Right,Proximal,Lateral Lower Leg: Cleanse wound with mild soap and water Wound #2 Right,Distal,Lateral Lower Leg: Cleanse wound with mild soap and water Anesthetic: Wound #1 Right,Proximal,Lateral Lower Leg: Topical Lidocaine 4% cream applied to wound bed prior to debridement Wound #2 Right,Distal,Lateral Lower Leg: Topical Lidocaine 4% cream applied to wound bed prior to debridement Skin Barriers/Peri-Wound Care: Wound #1 Right,Proximal,Lateral Lower Leg: Barrier cream Moisturizing lotion Wound #2 Right,Distal,Lateral Lower Leg: Barrier cream Moisturizing lotion Primary Wound Dressing: Wound #1 Right,Proximal,Lateral Lower Leg: Silvercel Non-Adherent Wound #2 Right,Distal,Lateral Lower Leg: Silvercel Non-Adherent Secondary Dressing: Wound #1 Right,Proximal,Lateral Lower Leg: ABD pad XtraSorb Other - carboflex Wound #2 Right,Distal,Lateral Lower Leg: ABD pad XtraSorb Other - carboflex Dressing Change Frequency: Wound #1 Right,Proximal,Lateral Lower Leg: Dressing is to be changed Monday and Thursday. Wound #2 Right,Distal,Lateral Lower Leg: Dressing is to be changed Monday and Thursday. Follow-up Appointments: Wound #1 Right,Proximal,Lateral Lower Leg: Return Appointment in 1 week. Nurse Visit as needed - Thursday Wound #2 Right,Distal,Lateral Lower Leg: Return Appointment in 1 week. - make appt this wednesday for nurse wrap and next monday evening for nurse wrap Edema Control: Wound #1 Right,Proximal,Lateral Lower Leg: 3 Layer Compression System - Right Lower Extremity - unna to anchor Wound #2 Right,Distal,Lateral Lower  Leg: 3 Layer Compression System - Right Lower Extremity - unna  to anchor Additional Orders / Instructions: Wound #1 Right,Proximal,Lateral Lower Leg: Increase protein intake. Other: - Please watch your salt (sodium) intake Wound #2 Right,Distal,Lateral Lower Leg: Increase protein intake. Other: - Please watch your salt (sodium) intake Medications-please add to medication list.: Wound #1 Right,Proximal,Lateral Lower Leg: P.O. Antibiotics Wound #2 Right,Distal,Lateral Lower Leg: P.O. Antibiotics Weyandt, Rolanda R. ([id number]) The following medication(s) was prescribed: doxycycline hyclate oral 100 mg capsule capsule oral bid starting 10/08/2017 she was unable to come for a dressing change last week and hence there was a lot of odor and drainage on her dressing. After clinical examination and discussion with the patient I have recommended: 1. Silver alginate with a 4 layer compression wrap to the right lower extremity - we will also use some extrasorb and some carboflex. She will need a nurse visit during the week. 2. doxycycline 100 mg twice a day for 14 days -- Refill given today 3. Use her custom built compression stockings of the left lower extremity 4. Lymphedema pumps to be utilized twice a day for an hour each 5. Elevation and exercises been discussed with her in great detail 6. Adequate protein, vitamin A, vitamin C and zinc 7. I have discussed her diet and the need to watch her salt and fluid intake and she says she will be careful 8. Regular visits to the wound center Electronic Signature(s) Signed: 10/08/2017 12:42:41 PM By: Evlyn Kanner MD, FACS Previous Signature: 10/08/2017 9:13:24 AM Version By: Evlyn Kanner MD, FACS Entered By: Evlyn Kanner on 10/08/2017 12:42:41 [name] ([id number]) -------------------------------------------------------------------------------- ROS/PFSH Details Patient Name: Jessica Casey R. Date of Service: 10/08/2017 8:15 AM Medical Record  Number: [id number] Patient Account Number: [id number] Date of Birth/Sex: Jun 11, 1981 (36 y.o. Female) Treating RN: Huel Coventry Primary Care Provider: PATIENT, NO Other Clinician: Referring Provider: Referral, Self Treating Provider/Extender: Rudene Re in Treatment: 4 Information Obtained From Patient Wound History Do you currently have one or more open woundso Yes How many open wounds do you currently haveo 1 Approximately how long have you had your woundso 1 month How have you been treating your wound(s) until nowo aquacel ag Has your wound(s) ever healed and then re-openedo No Have you had any lab work done in the past montho No Have you tested positive for an antibiotic resistant organism (MRSA, VRE)o No Have you tested positive for osteomyelitis (bone infection)o No Have you had any tests for circulation on your legso Yes Who ordered the testo [facility] Where was the test doneo [facility] Hematologic/Lymphatic Medical History: Positive for: Lymphedema Respiratory Medical History: Negative for: Aspiration; Asthma; Chronic Obstructive Pulmonary Disease (COPD); Pneumothorax; Sleep Apnea; Tuberculosis Cardiovascular Medical History: Positive for: Hypertension Negative for: Angina; Arrhythmia; Congestive Heart Failure; Coronary Artery Disease; Deep Vein Thrombosis; Hypotension; Myocardial Infarction; Peripheral Arterial Disease; Peripheral Venous Disease; Phlebitis; Vasculitis Immunizations Pneumococcal Vaccine: Received Pneumococcal Vaccination: No Immunization Notes: up to date Implantable Devices Family and Social History Cancer: No; Diabetes: No; Heart Disease: No; Hereditary Spherocytosis: No; Hypertension: No; Kidney Disease: Yes - Mother; Lung Disease: No; Seizures: No; Stroke: No; Thyroid Problems: No; Tuberculosis: No; Never smoker; Marital Status - Married; Alcohol Use: Never; Drug Use: No History; Caffeine Use: Daily; Financial Concerns: No; Food, Clothing or  Shelter Needs: No; Support System Lacking: No; Transportation Concerns: No; Advanced Directives: No; Patient does not want information on Advanced Directives ENDORA, TERESI ([id number]) Physician Affirmation I have reviewed and agree with the above information. Electronic Signature(s) Signed: 10/08/2017 1:42:06 PM By: Elliot Gurney, BSN, RN, CWS,  Selena Batten RN, BSN Signed: 10/08/2017 4:20:07 PM By: Evlyn Kanner MD, FACS Entered By: Evlyn Kanner on 10/08/2017 09:11:27 [name] ([id number]) -------------------------------------------------------------------------------- SuperBill Details Patient Name: Jessica Casey R. Date of Service: 10/08/2017 Medical Record Number: [id number] Patient Account Number: [id number] Date of Birth/Sex: 07/11/1981 (36 y.o. Female) Treating RN: Huel Coventry Primary Care Provider: PATIENT, NO Other Clinician: Referring Provider: Referral, Self Treating Provider/Extender: Rudene Re in Treatment: 4 Diagnosis Coding ICD-10 Codes Code Description [phone] 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d I89.0 Lymphedema, not elsewhere classified I87.311 Chronic venous hypertension (idiopathic) with ulcer of right lower extremity E66.01 Morbid (severe) obesity due to excess calories Facility Procedures CPT4 Code: 21308657 Description: 11042 - DEB SUBQ TISSUE 20 SQ CM/< ICD-10 Diagnosis Description L97.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 I89.0 Lymphedema, not elsewhere classified I87.311 Chronic venous hypertension (idiopathic) with ulcer of  right l Modifier: d ower extremity Quantity: 1 CPT4 Code: 84696295 Description: 11045 - DEB SUBQ TISS EA ADDL 20CM ICD-10 Diagnosis Description L97.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 I89.0 Lymphedema, not elsewhere classified I87.311 Chronic venous hypertension (idiopathic) with ulcer of  right l Modifier: d ower extremity Quantity: 1 Physician  Procedures CPT4 Code: 2841324 Description: 11042 - WC PHYS SUBQ TISS 20 SQ CM ICD-10 Diagnosis Description L97.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 I89.0 Lymphedema, not elsewhere classified I87.311 Chronic venous hypertension (idiopathic) with ulcer of  right l Modifier: d ower extremity Quantity: 1 CPT4 Code: 4010272 Description: 11045 - WC PHYS SUBQ TISS EA ADDL 20 CM ICD-10 Diagnosis Description L97.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 ([id number]) Entered By: Evlyn Kanner on 10/08/2017 12:42:50

## 2017-10-11 NOTE — Progress Notes (Signed)
[name], [name] ([id number]) Visit Report for 10/10/2017 Arrival Information Details Patient Name: [name], Nini R. Date of Service: 10/10/2017 9:15 AM Medical Record Number: [id number] Patient Account Number: [id number] Date of Birth/Sex: 11/21/1980 (36 y.o. Female) Treating RN: Renne CriglerFlinchum, Cheryl Primary Care Rune Mendez: PATIENT, NO Other Clinician: Referring Charie Pinkus: Referral, Self Treating Hilmer Aliberti/Extender: STONE III, HOYT Weeks in Treatment: 4 Visit Information History Since Last Visit All ordered tests and consults were completed: No Patient Arrived: Ambulatory Added or deleted any medications: No Arrival Time: 09:34 Any new allergies or adverse reactions: No Accompanied By: self Had a fall or experienced change in No Transfer Assistance: None activities of daily living that may affect Patient Requires Transmission-Based No risk of falls: Precautions: Hospitalized since last visit: No Patient Has Alerts: No Pain Present Now: No Electronic Signature(s) Signed: 10/10/2017 3:20:43 PM By: Renne CriglerFlinchum, Cheryl Entered By: Renne CriglerFlinchum, Cheryl on 10/10/2017 09:34:25 Goodell, Jeananne R. ([id number]) -------------------------------------------------------------------------------- Encounter Discharge Information Details Patient Name: [name], [name] Date of Service: 10/10/2017 9:15 AM Medical Record Number: [id number] Patient Account Number: [id number] Date of Birth/Sex: 11/21/1980 (36 y.o. Female) Treating RN: Renne CriglerFlinchum, Cheryl Primary Care Shylin Keizer: PATIENT, NO Other Clinician: Referring Matheu Ploeger: Referral, Self Treating Jewelz Kobus/Extender: STONE III, HOYT Weeks in Treatment: 4 Encounter Discharge Information Items Discharge Pain Level: 0 Discharge Condition: Stable Ambulatory Status: Ambulatory Discharge Destination: Home Private Transportation: Auto Accompanied By: self Schedule Follow-up Appointment: Yes Medication Reconciliation completed and No provided to Patient/Care  Dystany Duffy: Clinical Summary of Care: Electronic Signature(s) Signed: 10/10/2017 3:20:43 PM By: Renne CriglerFlinchum, Cheryl Entered By: Renne CriglerFlinchum, Cheryl on 10/10/2017 09:46:31 [name], [name] ([id number]) -------------------------------------------------------------------------------- Patient/Caregiver Education Details Patient Name: [name], Zitlali R. Date of Service: 10/10/2017 9:15 AM Medical Record Number: [id number] Patient Account Number: [id number] Date of Birth/Gender: 11/21/1980 (36 y.o. Female) Treating RN: Renne CriglerFlinchum, Cheryl Primary Care Physician: PATIENT, NO Other Clinician: Referring Physician: Referral, Self Treating Physician/Extender: Linwood DibblesSTONE III, HOYT Weeks in Treatment: 4 Education Assessment Education Provided To: Patient Education Topics Provided Wound/Skin Impairment: Handouts: Other: do not get wrap wet Methods: Demonstration, Explain/Verbal Responses: State content correctly Electronic Signature(s) Signed: 10/10/2017 3:20:43 PM By: Renne CriglerFlinchum, Cheryl Entered By: Renne CriglerFlinchum, Cheryl on 10/10/2017 09:46:18 [name], [name] ([id number]) -------------------------------------------------------------------------------- Wound Assessment Details Patient Name: [name], Abi R. Date of Service: 10/10/2017 9:15 AM Medical Record Number: [id number] Patient Account Number: [id number] Date of Birth/Sex: 11/21/1980 (36 y.o. Female) Treating RN: Renne CriglerFlinchum, Cheryl Primary Care Trenton Verne: PATIENT, NO Other Clinician: Referring Catarina Huntley: Referral, Self Treating Ciera Beckum/Extender: STONE III, HOYT Weeks in Treatment: 4 Wound Status Wound Number: 1 Primary Etiology: Lymphedema Wound Location: Right Lower Leg - Lateral, Proximal Wound Status: Open Wounding Event: Gradually Appeared Comorbid History: Lymphedema, Hypertension Date Acquired: 08/08/2017 Weeks Of Treatment: 4 Clustered Wound: No Photos Wound Measurements Length: (cm) 0.9 Width: (cm) 1.2 Depth: (cm) 0.1 Area: (cm) 0.848 Volume:  (cm) 0.085 % Reduction in Area: 43.8% % Reduction in Volume: 71.9% Epithelialization: None Tunneling: No Undermining: No Wound Description Full Thickness With Exposed Support Classification: Structures Wound Margin: Flat and Intact Exudate Large Amount: Exudate Type: Serosanguineous Exudate Color: red, brown Foul Odor After Cleansing: Yes Due to Product Use: No Slough/Fibrino Yes Wound Bed Granulation Amount: Large (67-100%) Exposed Structure Granulation Quality: Red Fascia Exposed: No Necrotic Amount: Small (1-33%) Fat Layer (Subcutaneous Tissue) Exposed: Yes Necrotic Quality: Adherent Slough Tendon Exposed: No Muscle Exposed: No Joint Exposed: No Bone Exposed: No Periwound Skin Texture Texture Color No Abnormalities Noted: No No Abnormalities Noted: No [name], [name] ([id number]) Callus: No Atrophie Blanche: No Crepitus: No Cyanosis: No Excoriation:  No Ecchymosis: No Induration: No Erythema: No Rash: No Hemosiderin Staining: No Scarring: No Mottled: No Pallor: No Moisture Rubor: No No Abnormalities Noted: No Dry / Scaly: No Temperature / Pain Maceration: Yes Temperature: No Abnormality Wound Preparation Ulcer Cleansing: Rinsed/Irrigated with Saline, Other: soap and water, Topical Anesthetic Applied: None Treatment Notes Wound #1 (Right, Proximal, Lateral Lower Leg) 1. Cleansed with: Clean wound with Normal Saline Cleanse wound with antibacterial soap and water 3. Peri-wound Care: Barrier cream Moisturizing lotion 4. Dressing Applied: Other dressing (specify in notes) 5. Secondary Dressing Applied ABD Pad 7. Secured with Tape 4-Layer Compression System - Right Lower Extremity Notes silvercel, xtrasorb, unna to anchor, charcoal Electronic Signature(s) Signed: 10/10/2017 3:20:43 PM By: Renne CriglerFlinchum, Cheryl Entered By: Renne CriglerFlinchum, Cheryl on 10/10/2017 10:05:00 [name], [name]  ([id number]) -------------------------------------------------------------------------------- Wound Assessment Details Patient Name: [name], [name] Date of Service: 10/10/2017 9:15 AM Medical Record Number: [id number] Patient Account Number: [id number] Date of Birth/Sex: December 20, 1980 (36 y.o. Female) Treating RN: Renne CriglerFlinchum, Cheryl Primary Care Tryphena Perkovich: PATIENT, NO Other Clinician: Referring Dossie Swor: Referral, Self Treating Courtland Reas/Extender: STONE III, HOYT Weeks in Treatment: 4 Wound Status Wound Number: 2 Primary Etiology: Lymphedema Wound Location: Right Lower Leg - Lateral, Distal Wound Status: Open Wounding Event: Gradually Appeared Comorbid History: Lymphedema, Hypertension Date Acquired: 08/08/2017 Weeks Of Treatment: 4 Clustered Wound: No Photos Wound Measurements Length: (cm) 4.5 Width: (cm) 7.5 Depth: (cm) 0.3 Area: (cm) 26.507 Volume: (cm) 7.952 % Reduction in Area: -23.6% % Reduction in Volume: -23.6% Epithelialization: None Tunneling: No Undermining: No Wound Description Full Thickness With Exposed Support Classification: Structures Wound Margin: Flat and Intact Exudate Large Amount: Exudate Type: Serosanguineous Exudate Color: red, brown Foul Odor After Cleansing: Yes Due to Product Use: No Slough/Fibrino Yes Wound Bed Granulation Amount: Medium (34-66%) Exposed Structure Granulation Quality: Red Fascia Exposed: No Necrotic Amount: Medium (34-66%) Fat Layer (Subcutaneous Tissue) Exposed: Yes Necrotic Quality: Adherent Slough Tendon Exposed: No Muscle Exposed: No Joint Exposed: No Bone Exposed: No Periwound Skin Texture Texture Color No Abnormalities Noted: No No Abnormalities Noted: No [name], [name] ([id number]) Callus: No Atrophie Blanche: No Crepitus: No Cyanosis: No Excoriation: No Ecchymosis: No Induration: No Erythema: No Rash: No Hemosiderin Staining: No Scarring: No Mottled: No Pallor: No Moisture Rubor: No No  Abnormalities Noted: No Dry / Scaly: No Temperature / Pain Maceration: Yes Temperature: No Abnormality Wound Preparation Ulcer Cleansing: Rinsed/Irrigated with Saline, Other: soap and water, Topical Anesthetic Applied: None Treatment Notes Wound #2 (Right, Distal, Lateral Lower Leg) 1. Cleansed with: Clean wound with Normal Saline Cleanse wound with antibacterial soap and water 3. Peri-wound Care: Barrier cream Moisturizing lotion 4. Dressing Applied: Other dressing (specify in notes) 5. Secondary Dressing Applied ABD Pad 7. Secured with Tape 4-Layer Compression System - Right Lower Extremity Notes silvercel, xtrasorb, unna to anchor, charcoal Electronic Signature(s) Signed: 10/10/2017 3:20:43 PM By: Renne CriglerFlinchum, Cheryl Entered By: Renne CriglerFlinchum, Cheryl on 10/10/2017 10:05:31

## 2017-10-15 NOTE — Progress Notes (Signed)
[name], [name] ([id number]) Visit Report for 10/15/2017 Arrival Information Details Patient Name: [name], [name] Date of Service: 10/15/2017 12:00 PM Medical Record Number: [id number] Patient Account Number: [id number] Date of Birth/Sex: 1981/07/18 (36 y.o. Female) Treating RN: Ashok CordiaPinkerton, Debi Primary Care Ninetta Adelstein: PATIENT, NO Other Clinician: Referring Saif Peter: Referral, Self Treating Khamya Topp/Extender: Rudene ReBritto, Errol Weeks in Treatment: 5 Visit Information History Since Last Visit All ordered tests and consults were completed: No Patient Arrived: Ambulatory Added or deleted any medications: No Arrival Time: 12:20 Any new allergies or adverse reactions: No Accompanied By: self Had a fall or experienced change in No Transfer Assistance: None activities of daily living that may affect Patient Identification Verified: Yes risk of falls: Secondary Verification Process Completed: Yes Signs or symptoms of abuse/neglect since last visito No Patient Requires Transmission-Based No Hospitalized since last visit: No Precautions: Has Dressing in Place as Prescribed: Yes Patient Has Alerts: No Has Compression in Place as Prescribed: Yes Pain Present Now: No Electronic Signature(s) Signed: 10/15/2017 1:23:42 PM By: Alejandro MullingPinkerton, Debra Entered By: Alejandro MullingPinkerton, Debra on 10/15/2017 12:20:38 [name], Kehlani R. ([id number]) -------------------------------------------------------------------------------- Encounter Discharge Information Details Patient Name: [name], [name] Date of Service: 10/15/2017 12:00 PM Medical Record Number: [id number] Patient Account Number: [id number] Date of Birth/Sex: 1981/07/18 (36 y.o. Female) Treating RN: Ashok CordiaPinkerton, Debi Primary Care Carleigh Buccieri: PATIENT, NO Other Clinician: Referring Trecia Maring: Referral, Self Treating Trinadee Verhagen/Extender: Rudene ReBritto, Errol Weeks in Treatment: 5 Encounter Discharge Information Items Discharge Pain Level: 0 Discharge Condition:  Stable Ambulatory Status: Ambulatory Discharge Destination: Home Private Transportation: Auto Accompanied By: self Schedule Follow-up Appointment: Yes Medication Reconciliation completed and No provided to Patient/Care Rooney Gladwin: Clinical Summary of Care: Electronic Signature(s) Signed: 10/15/2017 1:23:42 PM By: Alejandro MullingPinkerton, Debra Entered By: Alejandro MullingPinkerton, Debra on 10/15/2017 12:29:32 [name], [name] ([id number]) -------------------------------------------------------------------------------- Patient/Caregiver Education Details Patient Name: [name], [name] Date of Service: 10/15/2017 12:00 PM Medical Record Number: [id number] Patient Account Number: [id number] Date of Birth/Gender: 1981/07/18 (36 y.o. Female) Treating RN: Ashok CordiaPinkerton, Debi Primary Care Physician: PATIENT, NO Other Clinician: Referring Physician: Referral, Self Treating Physician/Extender: Rudene ReBritto, Errol Weeks in Treatment: 5 Education Assessment Education Provided To: Patient Education Topics Provided Wound/Skin Impairment: Handouts: Caring for Your Ulcer, Other: keep wrap dry Methods: Demonstration, Explain/Verbal Responses: State content correctly Electronic Signature(s) Signed: 10/15/2017 1:23:42 PM By: Alejandro MullingPinkerton, Debra Entered By: Alejandro MullingPinkerton, Debra on 10/15/2017 12:29:21 Slawinski, Narmeen R. ([id number]) -------------------------------------------------------------------------------- Wound Assessment Details Patient Name: [name], [name] Date of Service: 10/15/2017 12:00 PM Medical Record Number: [id number] Patient Account Number: [id number] Date of Birth/Sex: 1981/07/18 (36 y.o. Female) Treating RN: Ashok CordiaPinkerton, Debi Primary Care [name]: PATIENT, NO Other Clinician: Referring [name]: Referral, Self Treating [name]/Extender: Rudene ReBritto, Errol Weeks in Treatment: 5 Wound Status Wound Number: 1 Primary Etiology: Lymphedema Wound Location: Right Lower Leg - Lateral, Proximal Wound Status: Open Wounding Event:  Gradually Appeared Comorbid History: Lymphedema, Hypertension Date Acquired: 08/08/2017 Weeks Of Treatment: 5 Clustered Wound: No Wound Measurements Length: (cm) 0.8 Width: (cm) 0.8 Depth: (cm) 0.1 Area: (cm) 0.503 Volume: (cm) 0.05 % Reduction in Area: 66.6% % Reduction in Volume: 83.4% Epithelialization: None Tunneling: No Undermining: No Wound Description Full Thickness With Exposed Support Classification: Structures Wound Margin: Flat and Intact Exudate Large Amount: Exudate Type: Serosanguineous Exudate Color: red, brown Foul Odor After Cleansing: Yes Due to Product Use: No Slough/Fibrino Yes Wound Bed Granulation Amount: Medium (34-66%) Exposed Structure Granulation Quality: Red Fascia Exposed: No Necrotic Amount: Medium (34-66%) Fat Layer (Subcutaneous Tissue) Exposed: Yes Necrotic Quality: Adherent Slough Tendon Exposed: No Muscle Exposed: No Joint Exposed: No Bone  Exposed: No Periwound Skin Texture Texture Color No Abnormalities Noted: No No Abnormalities Noted: No Callus: No Atrophie Blanche: No Crepitus: No Cyanosis: No Excoriation: No Ecchymosis: No Induration: No Erythema: No Rash: No Hemosiderin Staining: No Scarring: No Mottled: No Pallor: No Moisture Rubor: No No Abnormalities Noted: No Dry / Scaly: No Temperature / Pain Yeomans, [name] ([id number]) Maceration: Yes Temperature: No Abnormality Wound Preparation Ulcer Cleansing: Rinsed/Irrigated with Saline, Other: soap and water, Topical Anesthetic Applied: None Treatment Notes Wound #1 (Right, Proximal, Lateral Lower Leg) 1. Cleansed with: Clean wound with Normal Saline Cleanse wound with antibacterial soap and water 4. Dressing Applied: Other dressing (specify in notes) 5. Secondary Dressing Applied ABD Pad 7. Secured with Tape 4-Layer Compression System - Right Lower Extremity Notes unna to anchor, silvercel, xtrasorb Electronic Signature(s) Signed: 10/15/2017  1:23:42 PM By: Alejandro MullingPinkerton, Debra Entered By: Alejandro MullingPinkerton, Debra on 10/15/2017 12:27:29 Salo, Cristal DeerERICA R. ([id number]) -------------------------------------------------------------------------------- Wound Assessment Details Patient Name: [name], Quintara R. Date of Service: 10/15/2017 12:00 PM Medical Record Number: [id number] Patient Account Number: [id number] Date of Birth/Sex: 1981/05/07 (36 y.o. Female) Treating RN: Ashok CordiaPinkerton, Debi Primary Care Jp Eastham: PATIENT, NO Other Clinician: Referring Harrison Paulson: Referral, Self Treating Jamilynn Whitacre/Extender: Rudene ReBritto, Errol Weeks in Treatment: 5 Wound Status Wound Number: 2 Primary Etiology: Lymphedema Wound Location: Right Lower Leg - Lateral, Distal Wound Status: Open Wounding Event: Gradually Appeared Comorbid History: Lymphedema, Hypertension Date Acquired: 08/08/2017 Weeks Of Treatment: 5 Clustered Wound: No Wound Measurements Length: (cm) 4.1 Width: (cm) 7.3 Depth: (cm) 0.3 Area: (cm) 23.507 Volume: (cm) 7.052 % Reduction in Area: -9.6% % Reduction in Volume: -9.6% Epithelialization: None Tunneling: No Undermining: No Wound Description Full Thickness With Exposed Support Classification: Structures Wound Margin: Flat and Intact Exudate Large Amount: Exudate Type: Serosanguineous Exudate Color: red, brown Foul Odor After Cleansing: Yes Due to Product Use: No Slough/Fibrino Yes Wound Bed Granulation Amount: Medium (34-66%) Exposed Structure Granulation Quality: Red Fascia Exposed: No Necrotic Amount: Medium (34-66%) Fat Layer (Subcutaneous Tissue) Exposed: Yes Necrotic Quality: Adherent Slough Tendon Exposed: No Muscle Exposed: No Joint Exposed: No Bone Exposed: No Periwound Skin Texture Texture Color No Abnormalities Noted: No No Abnormalities Noted: No Callus: No Atrophie Blanche: No Crepitus: No Cyanosis: No Excoriation: No Ecchymosis: No Induration: No Erythema: No Rash: No Hemosiderin Staining: No Scarring:  No Mottled: No Pallor: No Moisture Rubor: No No Abnormalities Noted: No Dry / Scaly: No Temperature / Pain Radu, Emilly R. ([id number]) Maceration: Yes Temperature: No Abnormality Wound Preparation Ulcer Cleansing: Rinsed/Irrigated with Saline, Other: soap and water, Topical Anesthetic Applied: None Treatment Notes Wound #2 (Right, Distal, Lateral Lower Leg) 1. Cleansed with: Clean wound with Normal Saline Cleanse wound with antibacterial soap and water 4. Dressing Applied: Other dressing (specify in notes) 5. Secondary Dressing Applied ABD Pad 7. Secured with Tape 4-Layer Compression System - Right Lower Extremity Notes unna to anchor, silvercel, xtrasorb Electronic Signature(s) Signed: 10/15/2017 1:23:42 PM By: Alejandro MullingPinkerton, Debra Entered By: Alejandro MullingPinkerton, Debra on 10/15/2017 12:28:20

## 2017-10-18 ENCOUNTER — Encounter: Payer: Self-pay | Admitting: Surgery

## 2017-10-19 NOTE — Progress Notes (Addendum)
[name], [name] ([id number]) Visit Report for 10/18/2017 Chief Complaint Document Details Patient Name: [name], [name] Date of Service: 10/18/2017 10:15 AM Medical Record Number: [id number] Patient Account Number: [id number] Date of Birth/Sex: [birth year] (36 y.o. Female) Treating RN: Ashok Cordia, Debi Primary Care Provider: PATIENT, NO Other Clinician: Referring Provider: Referral, Self Treating Provider/Extender: Rudene Re in Treatment: 5 Information Obtained from: Patient Chief Complaint Patient presents for treatment of an open ulcer due to venous insufficiency to the right lower extremity which she's had for about a month Electronic Signature(s) Signed: 10/18/2017 11:15:46 AM By: Evlyn Kanner MD, FACS Entered By: Evlyn Kanner on 10/18/2017 11:15:46 Woodburn, [name] ([id number]) -------------------------------------------------------------------------------- HPI Details Patient Name: [name] R. Date of Service: 10/18/2017 10:15 AM Medical Record Number: [id number] Patient Account Number: [id number] Date of Birth/Sex: 01/22/1981 (36 y.o. Female) Treating RN: Ashok Cordia, Debi Primary Care Provider: PATIENT, NO Other Clinician: Referring Provider: Referral, Self Treating Provider/Extender: Rudene Re in Treatment: 5 History of Present Illness Location: right lower extremity lateral calf several large areas Quality: Patient reports experiencing a sharp pain to affected area(s). Severity: Patient states wound are getting worse. Duration: Patient has had the wound for < 5 weeks prior to presenting for treatment Timing: Pain in wound is Intermittent (comes and goes Context: The wound would happen gradually Modifying Factors: Other treatment(s) tried include:admission to the hospital for sepsis and a full workup with treatment with IV antibiotics and discharged on oral antibiotics Associated Signs and Symptoms: Patient reports having increase discharge. HPI  Description: 36 year old patient well known to our [facility] wound care clinic where she has been seen since 2016 for bilateral lower extremity venous insufficiency disease with lymphedema and multiple ulcerations associated with morbid obesity. she had custom-made compression stockings and lymphedema pumps which were used in the past. most recently she was admitted to the hospital between October 11 and 09/02/2017 with sepsis, lower extremity wounds and lymphedema.she was initially treated in the outpatient with Keflex and Bactrim. she was initially treated in the hospital with vancomycin and Zosyn and changed over to Unasyn until her white count improved and her blood cultures were negative for 3 days. After her inpatient management she was discharged home on Augmentin to end on 09/13/2017 with a 14 day course. she has had outpatient vascular duplex scans completed in November 2017 and her right ABI was 1.1 and the left ABI is 1.3. she had normal toe brachial indices bilaterally.she had three-vessel runoff in the right lower extremity and two-vessel runoff in the left lower extremity. On questioning the patient she does have custom made compression stockings and also has a lymphedema pump but has not been using it appropriately and has not been taking good care of herself. 09/17/2017 -- she returns today with compression stockings on the left side and the right side has had significant amount of drainage and has a very strong odor 09/24/2017 -- the drainage is increased significantly and she has more lymphedema and a very strong odor to her wound. Though she does not have systemic symptoms, or overt infection I believe she will benefit from some doxycycline given empirically. 10/01/2017 -- after starting the doxycycline and changing the dressing twice a week her symptoms and signs have definitely improved overall. 10/08/2017 -- she has completed her course of doxycycline but continues to have  a lot of drainage and needs twice a week dressing changes. Electronic Signature(s) Signed: 10/18/2017 11:15:52 AM By: Evlyn Kanner MD, FACS Entered By: Evlyn Kanner on 10/18/2017 11:15:52 [name],  [name] ([id number]) -------------------------------------------------------------------------------- Physical Exam Details Patient Name: [name], [name] Date of Service: 10/18/2017 10:15 AM Medical Record Number: [id number] Patient Account Number: [id number] Date of Birth/Sex: 1981-08-22 (36 y.o. Female) Treating RN: Ashok CordiaPinkerton, Debi Primary Care Provider: PATIENT, NO Other Clinician: Referring Provider: Referral, Self Treating Provider/Extender: Rudene ReBritto, Cynithia Hakimi Weeks in Treatment: 5 Constitutional . Pulse regular. Respirations normal and unlabored. Afebrile. . Eyes Nonicteric. Reactive to light. Ears, Nose, Mouth, and Throat Lips, teeth, and gums WNL.Marland Kitchen. Moist mucosa without lesions. Neck supple and nontender. No palpable supraclavicular or cervical adenopathy. Normal sized without goiter. Respiratory WNL. No retractions.. Cardiovascular Pedal Pulses WNL. No clubbing, cyanosis or edema. Lymphatic No adneopathy. No adenopathy. No adenopathy. Musculoskeletal Adexa without tenderness or enlargement.. Digits and nails w/o clubbing, cyanosis, infection, petechiae, ischemia, or inflammatory conditions.. Integumentary (Hair, Skin) No suspicious lesions. No crepitus or fluctuance. No peri-wound warmth or erythema. No masses.Marland Kitchen. Psychiatric Judgement and insight Intact.. No evidence of depression, anxiety, or agitation.. Notes the wound is looking much better today and the limb for your and the odor has gone down significantly. No sharp debridement was required today. Electronic Signature(s) Signed: 10/18/2017 11:16:35 AM By: Evlyn KannerBritto, Shade Rivenbark MD, FACS Entered By: Evlyn KannerBritto, Kaidyn Hernandes on 10/18/2017 11:16:35 Jessica HindLEATH, [name]  ([id number]) -------------------------------------------------------------------------------- Physician Orders Details Patient Name: [name], [name] Date of Service: 10/18/2017 10:15 AM Medical Record Number: [id number] Patient Account Number: [id number] Date of Birth/Sex: 1981-08-22 (36 y.o. 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 I89.0 Lymphedema, not elsewhere classified I87.311 Chronic venous hypertension (idiopathic) with ulcer of right lower extremity E66.01 Morbid (severe) obesity due to excess calories Wound Cleansing Wound #1 Right,Proximal,Lateral Lower Leg o Cleanse wound with mild soap and water Wound #2 Right,Distal,Lateral Lower Leg o Cleanse wound with mild soap and water Anesthetic Wound #1 Right,Proximal,Lateral Lower Leg o Topical Lidocaine 4% cream applied to wound bed prior to debridement Wound #2 Right,Distal,Lateral Lower Leg o Topical Lidocaine 4% cream applied to wound bed prior to debridement Skin Barriers/Peri-Wound Care Wound #1 Right,Proximal,Lateral Lower Leg o Barrier cream o Moisturizing lotion Wound #2 Right,Distal,Lateral Lower Leg o Barrier cream o Moisturizing lotion Primary Wound Dressing Wound #1 Right,Proximal,Lateral Lower Leg o Silvercel Non-Adherent Wound #2 Right,Distal,Lateral Lower Leg o Silvercel Non-Adherent Secondary Dressing Wound #1 Right,Proximal,Lateral Lower Leg o ABD pad o XtraSorb [name], [name] ([id number]) Wound #2 Right,Distal,Lateral Lower Leg o ABD pad o XtraSorb Dressing Change Frequency Wound #1 Right,Proximal,Lateral Lower Leg o Dressing is to be changed Monday and Thursday. Wound #2  Right,Distal,Lateral Lower Leg o Dressing is to be changed Monday and Thursday. Follow-up Appointments Wound #1 Right,Proximal,Lateral Lower Leg o Return Appointment in 1 week. o Nurse Visit as needed - Thursday Wound #2 Right,Distal,Lateral Lower Leg o Return Appointment in 1 week. Edema Control Wound #1 Right,Proximal,Lateral Lower Leg o 3 Layer Compression System - Right Lower Extremity - unna to anchor Wound #2 Right,Distal,Lateral Lower Leg o 3 Layer Compression System - Right Lower Extremity - unna to anchor Additional Orders / Instructions Wound #1 Right,Proximal,Lateral Lower Leg o Increase protein intake. o Other: - Please watch your salt (sodium) intake Wound #2 Right,Distal,Lateral Lower Leg o Increase protein intake. o Other: - Please watch your salt (sodium) intake Medications-please add to medication list. Wound #1 Right,Proximal,Lateral Lower Leg o P.O.  Antibiotics Wound #2 Right,Distal,Lateral Lower Leg o P.O. Antibiotics Electronic Signature(s) Signed: 10/18/2017 4:11:09 PM By: Renne Crigler Signed: 10/18/2017 4:36:05 PM By: Evlyn Kanner MD, FACS Entered By: Renne Crigler on 10/18/2017 11:33:18 [name], Jessica Deer ([id number]) -------------------------------------------------------------------------------- Problem List Details Patient Name: [name] R. Date of Service: 10/18/2017 10:15 AM Medical Record Number: [id number] Patient Account Number: [id number] Date of Birth/Sex: 1981-11-13 (36 y.o. Female) Treating RN: Ashok Cordia, Debi Primary Care Provider: PATIENT, NO Other Clinician: Referring Provider: Referral, Self Treating Provider/Extender: Rudene Re in Treatment: 5 Active Problems ICD-10 Encounter Code Description Active Date Diagnosis L97.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d 09/10/2017 Yes I89.0 Lymphedema, not elsewhere classified 09/10/2017 Yes I87.311 Chronic venous  hypertension (idiopathic) with ulcer of right lower 09/10/2017 Yes extremity E66.01 Morbid (severe) obesity due to excess calories 09/10/2017 Yes Inactive Problems Resolved Problems Electronic Signature(s) Signed: 10/18/2017 11:15:31 AM By: Evlyn Kanner MD, FACS Entered By: Evlyn Kanner on 10/18/2017 11:15:31 [name], [name] ([id number]) -------------------------------------------------------------------------------- Progress Note Details Patient Name: [name] R. Date of Service: 10/18/2017 10:15 AM Medical Record Number: [id number] Patient Account Number: [id number] Date of Birth/Sex: 21-Aug-1981 (36 y.o. Female) Treating RN: Ashok Cordia, Debi Primary Care Provider: PATIENT, NO Other Clinician: Referring Provider: Referral, Self Treating Provider/Extender: Rudene Re in Treatment: 5 Subjective Chief Complaint Information obtained from Patient Patient presents for treatment of an open ulcer due to venous insufficiency to the right lower extremity which she's had for about a month History of Present Illness (HPI) The following HPI elements were documented for the patient's wound: Location: right lower extremity lateral calf several large areas Quality: Patient reports experiencing a sharp pain to affected area(s). Severity: Patient states wound are getting worse. Duration: Patient has had the wound for < 5 weeks prior to presenting for treatment Timing: Pain in wound is Intermittent (comes and goes Context: The wound would happen gradually Modifying Factors: Other treatment(s) tried include:admission to the hospital for sepsis and a full workup with treatment with IV antibiotics and discharged on oral antibiotics Associated Signs and Symptoms: Patient reports having increase discharge. 36 year old patient well known to our [facility] wound care clinic where she has been seen since 2016 for bilateral lower extremity venous insufficiency disease with lymphedema and  multiple ulcerations associated with morbid obesity. she had custom-made compression stockings and lymphedema pumps which were used in the past. most recently she was admitted to the hospital between October 11 and 09/02/2017 with sepsis, lower extremity wounds and lymphedema.she was initially treated in the outpatient with Keflex and Bactrim. she was initially treated in the hospital with vancomycin and Zosyn and changed over to Unasyn until her white count improved and her blood cultures were negative for 3 days. After her inpatient management she was discharged home on Augmentin to end on 09/13/2017 with a 14 day course. she has had outpatient vascular duplex scans completed in November 2017 and her right ABI was 1.1 and the left ABI is 1.3. she had normal toe brachial indices bilaterally.she had three-vessel runoff in the right lower extremity and two-vessel runoff in the left lower extremity. On questioning the patient she does have custom made compression stockings and also has a lymphedema pump but has not been using it appropriately and has not been taking good care of herself. 09/17/2017 -- she returns today with compression stockings on the left side and the right side has had significant amount of drainage and has a very strong odor 09/24/2017 -- the drainage is increased significantly  and she has more lymphedema and a very strong odor to her wound. Though she does not have systemic symptoms, or overt infection I believe she will benefit from some doxycycline given empirically. 10/01/2017 -- after starting the doxycycline and changing the dressing twice a week her symptoms and signs have definitely improved overall. 10/08/2017 -- she has completed her course of doxycycline but continues to have a lot of drainage and needs twice a week dressing changes. [name], [name] ([id number]) Patient History Information obtained from Patient. Family History Kidney Disease - Mother, No family  history of Cancer, Diabetes, Heart Disease, Hereditary Spherocytosis, Hypertension, Lung Disease, Seizures, Stroke, Thyroid Problems, Tuberculosis. Social History Never smoker, Marital Status - Married, Alcohol Use - Never, Drug Use - No History, Caffeine Use - Daily. Objective Constitutional Pulse regular. Respirations normal and unlabored. Afebrile. Vitals Time Taken: 10:34 AM, Height: 74 in, Weight: 505 lbs, BMI: 64.8, Temperature: 98.2 F, Pulse: 109 bpm, Respiratory Rate: 20 breaths/min, Blood Pressure: 152/92 mmHg. Eyes Nonicteric. Reactive to light. Ears, Nose, Mouth, and Throat Lips, teeth, and gums WNL.Marland Kitchen Moist mucosa without lesions. Neck supple and nontender. No palpable supraclavicular or cervical adenopathy. Normal sized without goiter. Respiratory WNL. No retractions.. Cardiovascular Pedal Pulses WNL. No clubbing, cyanosis or edema. Lymphatic No adneopathy. No adenopathy. No adenopathy. Musculoskeletal Adexa without tenderness or enlargement.. Digits and nails w/o clubbing, cyanosis, infection, petechiae, ischemia, or inflammatory conditions.Marland Kitchen Psychiatric Judgement and insight Intact.. No evidence of depression, anxiety, or agitation.. General Notes: the wound is looking much better today and the limb for your and the odor has gone down significantly. No sharp debridement was required today. Integumentary (Hair, Skin) [name], [name] ([id number]) No suspicious lesions. No crepitus or fluctuance. No peri-wound warmth or erythema. No masses.. Wound #1 status is Open. Original cause of wound was Gradually Appeared. The wound is located on the Right,Proximal,Lateral Lower Leg. The wound measures 0.8cm length x 0.6cm width x 0.1cm depth; 0.377cm^2 area and 0.038cm^3 volume. There is Fat Layer (Subcutaneous Tissue) Exposed exposed. There is a large amount of serosanguineous drainage noted. Foul odor after cleansing was noted. The wound margin is flat and intact. There is  medium (34-66%) red granulation within the wound bed. There is a medium (34-66%) amount of necrotic tissue within the wound bed including Adherent Slough. The periwound skin appearance exhibited: Maceration. The periwound skin appearance did not exhibit: Callus, Crepitus, Excoriation, Induration, Rash, Scarring, Dry/Scaly, Atrophie Blanche, Cyanosis, Ecchymosis, Hemosiderin Staining, Mottled, Pallor, Rubor, Erythema. Periwound temperature was noted as No Abnormality. Wound #2 status is Open. Original cause of wound was Gradually Appeared. The wound is located on the Right,Distal,Lateral Lower Leg. The wound measures 3.2cm length x 7cm width x 0.3cm depth; 17.593cm^2 area and 5.278cm^3 volume. There is Fat Layer (Subcutaneous Tissue) Exposed exposed. There is a large amount of serosanguineous drainage noted. Foul odor after cleansing was noted. The wound margin is flat and intact. There is medium (34-66%) red granulation within the wound bed. There is a medium (34-66%) amount of necrotic tissue within the wound bed including Adherent Slough. The periwound skin appearance exhibited: Maceration. The periwound skin appearance did not exhibit: Callus, Crepitus, Excoriation, Induration, Rash, Scarring, Dry/Scaly, Atrophie Blanche, Cyanosis, Ecchymosis, Hemosiderin Staining, Mottled, Pallor, Rubor, Erythema. Periwound temperature was noted as No Abnormality. Assessment Active Problems ICD-10 L97.212 - 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d I89.0 - Lymphedema, not elsewhere classified I87.311 - Chronic venous hypertension (idiopathic) with ulcer of right lower extremity E66.01 - Morbid (severe)  obesity due to excess calories Plan Wound Cleansing: Wound #1 Right,Proximal,Lateral Lower Leg: Cleanse wound with mild soap and water Wound #2 Right,Distal,Lateral Lower Leg: Cleanse wound with mild soap and water Anesthetic: Wound #1 Right,Proximal,Lateral Lower Leg: Topical  Lidocaine 4% cream applied to wound bed prior to debridement Wound #2 Right,Distal,Lateral Lower Leg: Topical Lidocaine 4% cream applied to wound bed prior to debridement Skin Barriers/Peri-Wound Care: Wound #1 Right,Proximal,Lateral Lower Leg: Barrier cream Moisturizing lotion Wound #2 Right,Distal,Lateral Lower Leg: Barrier cream Moisturizing lotion Primary Wound Dressing: [name], Soley R. ([id number]) Wound #1 Right,Proximal,Lateral Lower Leg: Silvercel Non-Adherent Wound #2 Right,Distal,Lateral Lower Leg: Silvercel Non-Adherent Secondary Dressing: Wound #1 Right,Proximal,Lateral Lower Leg: ABD pad XtraSorb Wound #2 Right,Distal,Lateral Lower Leg: ABD pad XtraSorb Dressing Change Frequency: Wound #1 Right,Proximal,Lateral Lower Leg: Dressing is to be changed Monday and Thursday. Wound #2 Right,Distal,Lateral Lower Leg: Dressing is to be changed Monday and Thursday. Follow-up Appointments: Wound #1 Right,Proximal,Lateral Lower Leg: Return Appointment in 1 week. Nurse Visit as needed - Thursday Wound #2 Right,Distal,Lateral Lower Leg: Return Appointment in 1 week. Edema Control: Wound #1 Right,Proximal,Lateral Lower Leg: 3 Layer Compression System - Right Lower Extremity - unna to anchor Wound #2 Right,Distal,Lateral Lower Leg: 3 Layer Compression System - Right Lower Extremity - unna to anchor Additional Orders / Instructions: Wound #1 Right,Proximal,Lateral Lower Leg: Increase protein intake. Other: - Please watch your salt (sodium) intake Wound #2 Right,Distal,Lateral Lower Leg: Increase protein intake. Other: - Please watch your salt (sodium) intake Medications-please add to medication list.: Wound #1 Right,Proximal,Lateral Lower Leg: P.O. Antibiotics Wound #2 Right,Distal,Lateral Lower Leg: P.O. Antibiotics I have recommended: 1. Silver alginate with a 4 layer compression wrap to the right lower extremity - we will also use some extrasorb and some carboflex.  She will need a nurse visit during the week -- on Monday. 2. doxycycline 100 mg twice a day to continue 3. Use her custom built compression stockings of the left lower extremity 4. Lymphedema pumps to be utilized twice a day for an hour each 5. Elevation and exercises been discussed with her in great detail 6. Adequate protein, vitamin A, vitamin C and zinc 7. I have discussed her diet and the need to watch her salt and fluid intake and she says she will be careful 8. Regular visits to the wound center Electronic Signature(s) Jessica HindLEATH, Legaci R. ([id number]) Signed: 10/18/2017 4:32:10 PM By: Evlyn KannerBritto, Cortne Amara MD, FACS Previous Signature: 10/18/2017 11:17:28 AM Version By: Evlyn KannerBritto, Marca Gadsby MD, FACS Entered By: Evlyn KannerBritto, Xoie Kreuser on 10/18/2017 16:32:10 Jessica HindLEATH, Saige R. ([id number]) -------------------------------------------------------------------------------- ROS/PFSH Details Patient Name: [name], [name] Date of Service: 10/18/2017 10:15 AM Medical Record Number: [id number] Patient Account Number: [id number] Date of Birth/Sex: 1981-02-19 (36 y.o. Female) Treating RN: Ashok CordiaPinkerton, Debi Primary Care Provider: PATIENT, NO Other Clinician: Referring Provider: Referral, Self Treating Provider/Extender: Rudene ReBritto, Kaytelyn Glore Weeks in Treatment: 5 Information Obtained From Patient Wound History Do you currently have one or more open woundso Yes How many open wounds do you currently haveo 1 Approximately how long have you had your woundso 1 month How have you been treating your wound(s) until nowo aquacel ag Has your wound(s) ever healed and then re-openedo No Have you had any lab work done in the past montho No Have you tested positive for an antibiotic resistant organism (MRSA, VRE)o No Have you tested positive for osteomyelitis (bone infection)o No Have you had any tests for circulation on your legso Yes [facility] the [facility] test done[facility] Hematologic/Lymphatic Medical  History: Positive  for: Lymphedema Respiratory Medical History: Negative for: Aspiration; Asthma; Chronic Obstructive Pulmonary Disease (COPD); Pneumothorax; Sleep Apnea; Tuberculosis Cardiovascular Medical History: Positive for: Hypertension Negative for: Angina; Arrhythmia; Congestive Heart Failure; Coronary Artery Disease; Deep Vein Thrombosis; Hypotension; Myocardial Infarction; Peripheral Arterial Disease; Peripheral Venous Disease; Phlebitis; Vasculitis Immunizations Pneumococcal Vaccine: Received Pneumococcal Vaccination: No Immunization Notes: up to date Implantable Devices Family and Social History Cancer: No; Diabetes: No; Heart Disease: No; Hereditary Spherocytosis: No; Hypertension: No; Kidney Disease: Yes - Mother; Lung Disease: No; Seizures: No; Stroke: No; Thyroid Problems: No; Tuberculosis: No; Never smoker; Marital Status - Married; Alcohol Use: Never; Drug Use: No History; Caffeine Use: Daily; Financial Concerns: No; Food, Clothing or Shelter Needs: No; Support System Lacking: No; Transportation Concerns: No; Advanced Directives: No; Patient does not want information on Advanced Directives [name], LAWTHER ([id number]) Physician Affirmation I have reviewed and agree with the above information. Electronic Signature(s) Signed: 10/18/2017 2:54:02 PM By: [name] Signed: 10/18/2017 4:36:05 PM By: Evlyn Kanner MD, FACS Entered By: Evlyn Kanner on 10/18/2017 11:16:01 Jessica Hind ([id number]) -------------------------------------------------------------------------------- SuperBill Details Patient Name: [name] R. Date of Service: 10/18/2017 Medical Record Number: [id number] Patient Account Number: [id number] Date of Birth/Sex: 03-Jul-1981 (36 y.o. Female) Treating RN: Ashok Cordia, Debi Primary Care Provider: PATIENT, NO Other Clinician: Referring Provider: Referral, Self Treating Provider/Extender: Rudene Re in Treatment: 5 Diagnosis Coding ICD-10 Codes Code  Description [phone] 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d I89.0 Lymphedema, not elsewhere classified I87.311 Chronic venous hypertension (idiopathic) with ulcer of right lower extremity E66.01 Morbid (severe) obesity due to excess calories Facility Procedures CPT4 Code: 41660630 Description: 99213 - WOUND CARE VISIT-LEV 3 EST PT Modifier: Quantity: 1 Physician Procedures CPT4 Code: [id number] Description: 99213 - WC PHYS LEVEL 3 - EST PT ICD-10 Diagnosis Description L97.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 I89.0 Lymphedema, not elsewhere classified I87.311 Chronic venous hypertension (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

## 2017-10-19 NOTE — Progress Notes (Signed)
TAJI, SATHER ([id number]) Visit Report for 10/18/2017 Arrival Information Details Patient Name: [name], [name] Date of Service: 10/18/2017 10:15 AM Medical Record Number: [id number] Patient Account Number: [id number] Date of Birth/Sex: [birth year] (36 y.o. Female) Treating RN: Renne Crigler Primary Care Michael Ventresca: PATIENT, NO Other Clinician: Referring Jeryn Cerney: Referral, Self Treating Lylah Lantis/Extender: Rudene Re in Treatment: 5 Visit Information History Since Last Visit All ordered tests and consults were completed: No Patient Arrived: Ambulatory Added or deleted any medications: No Arrival Time: 10:33 Any new allergies or adverse reactions: No Accompanied By: self Had a fall or experienced change in No Transfer Assistance: None activities of daily living that may affect Patient Identification Verified: Yes risk of falls: Secondary Verification Process Completed: Yes Signs or symptoms of abuse/neglect since last visito No Patient Requires Transmission-Based No Hospitalized since last visit: No Precautions: Pain Present Now: Yes Patient Has Alerts: No Electronic Signature(s) Signed: 10/18/2017 4:11:09 PM By: Renne Crigler Entered By: Renne Crigler on 10/18/2017 10:34:10 Parr, Klynn R. ([id number]) -------------------------------------------------------------------------------- Clinic Level of Care Assessment Details Patient Name: Jessica Casey R. Date of Service: 10/18/2017 10:15 AM Medical Record Number: [id number] Patient Account Number: [id number] Date of Birth/Sex: 1981/05/26 (36 y.o. Female) Treating RN: Renne Crigler Primary Care Porscha Axley: PATIENT, NO Other Clinician: Referring Shaneta Cervenka: Referral, Self Treating Jalayne Ganesh/Extender: Rudene Re in Treatment: 5 Clinic Level of Care Assessment Items TOOL 4 Quantity Score []  - Use when only an EandM is performed on FOLLOW-UP visit 0 ASSESSMENTS - Nursing Assessment / Reassessment []  -  Reassessment of Co-morbidities (includes updates in patient status) 0 X- 1 5 Reassessment of Adherence to Treatment Plan ASSESSMENTS - Wound and Skin Assessment / Reassessment []  - Simple Wound Assessment / Reassessment - one wound 0 []  - 0 Complex Wound Assessment / Reassessment - multiple wounds []  - 0 Dermatologic / Skin Assessment (not related to wound area) ASSESSMENTS - Focused Assessment []  - Circumferential Edema Measurements - multi extremities 0 []  - 0 Nutritional Assessment / Counseling / Intervention []  - 0 Lower Extremity Assessment (monofilament, tuning fork, pulses) []  - 0 Peripheral Arterial Disease Assessment (using hand held doppler) ASSESSMENTS - Ostomy and/or Continence Assessment and Care []  - Incontinence Assessment and Management 0 []  - 0 Ostomy Care Assessment and Management (repouching, etc.) PROCESS - Coordination of Care []  - Simple Patient / Family Education for ongoing care 0 X- 1 20 Complex (extensive) Patient / Family Education for ongoing care []  - 0 Staff obtains Chiropractor, Records, Test Results / Process Orders []  - 0 Staff telephones HHA, Nursing Homes / Clarify orders / etc []  - 0 Routine Transfer to another Facility (non-emergent condition) []  - 0 Routine Hospital Admission (non-emergent condition) []  - 0 New Admissions / Manufacturing engineer / Ordering NPWT, Apligraf, etc. []  - 0 Emergency Hospital Admission (emergent condition) []  - 0 Simple Discharge Coordination Carrera, Addalyne R. ([id number]) X- 1 15 Complex (extensive) Discharge Coordination PROCESS - Special Needs []  - Pediatric / Minor Patient Management 0 []  - 0 Isolation Patient Management []  - 0 Hearing / Language / Visual special needs []  - 0 Assessment of Community assistance (transportation, D/C planning, etc.) []  - 0 Additional assistance / Altered mentation []  - 0 Support Surface(s) Assessment (bed, cushion, seat, etc.) INTERVENTIONS - Wound Cleansing /  Measurement []  - Simple Wound Cleansing - one wound 0 X- 2 5 Complex Wound Cleansing - multiple wounds X- 1 5 Wound Imaging (photographs - any number of wounds) []  - 0 Wound Tracing (instead of photographs) []  -  0 Simple Wound Measurement - one wound X- 2 5 Complex Wound Measurement - multiple wounds INTERVENTIONS - Wound Dressings []  - Small Wound Dressing one or multiple wounds 0 X- 2 15 Medium Wound Dressing one or multiple wounds []  - 0 Large Wound Dressing one or multiple wounds []  - 0 Application of Medications - topical []  - 0 Application of Medications - injection INTERVENTIONS - Miscellaneous []  - External ear exam 0 []  - 0 Specimen Collection (cultures, biopsies, blood, body fluids, etc.) []  - 0 Specimen(s) / Culture(s) sent or taken to Lab for analysis []  - 0 Patient Transfer (multiple staff / Nurse, adult / Similar devices) []  - 0 Simple Staple / Suture removal (25 or less) []  - 0 Complex Staple / Suture removal (26 or more) []  - 0 Hypo / Hyperglycemic Management (close monitor of Blood Glucose) []  - 0 Ankle / Brachial Index (ABI) - do not check if billed separately X- 1 5 Vital Signs Lowden, Dellene R. ([id number]) Has the patient been seen at the hospital within the last three years: Yes Total Score: 100 Level Of Care: New/Established - Level 3 Electronic Signature(s) Signed: 10/18/2017 4:11:09 PM By: Renne Crigler Entered By: Renne Crigler on 10/18/2017 11:34:17 Damiani, Jordynn R. ([id number]) -------------------------------------------------------------------------------- Encounter Discharge Information Details Patient Name: Jessica Casey R. Date of Service: 10/18/2017 10:15 AM Medical Record Number: [id number] Patient Account Number: [id number] Date of Birth/Sex: 1981-11-10 (36 y.o. Female) Treating RN: Renne Crigler Primary Care Jaking Thayer: PATIENT, NO Other Clinician: Referring Ahmia Colford: Referral, Self Treating Avielle Imbert/Extender: Rudene Re in Treatment: 5 Encounter Discharge Information Items Discharge Pain Level: 0 Discharge Condition: Stable Ambulatory Status: Ambulatory Discharge Destination: Home Private Transportation: Auto Accompanied By: self Schedule Follow-up Appointment: Yes Medication Reconciliation completed and provided No to Patient/Care Luverna Degenhart: Clinical Summary of Care: Electronic Signature(s) Signed: 10/18/2017 4:11:09 PM By: Renne Crigler Entered By: Renne Crigler on 10/18/2017 11:36:05 Stevick, Gertie R. ([id number]) -------------------------------------------------------------------------------- Lower Extremity Assessment Details Patient Name: Jessica Casey R. Date of Service: 10/18/2017 10:15 AM Medical Record Number: [id number] Patient Account Number: [id number] Date of Birth/Sex: 1981-02-22 (36 y.o. Female) Treating RN: Renne Crigler Primary Care Zachari Alberta: PATIENT, NO Other Clinician: Referring Brayah Urquilla: Referral, Self Treating Yuval Rubens/Extender: Rudene Re in Treatment: 5 Edema Assessment Assessed: [Left: No] [Right: No] Edema: [Left: Ye] [Right: s] Vascular Assessment Claudication: Claudication Assessment [Right:None] Pulses: Dorsalis Pedis Palpable: [Right:Yes] Posterior Tibial Extremity colors, hair growth, and conditions: Extremity Color: [Right:Hyperpigmented] Hair Growth on Extremity: [Right:Yes] Temperature of Extremity: [Right:Warm] Capillary Refill: [Right:< 3 seconds] Toe Nail Assessment Left: Right: Thick: Yes Discolored: Yes Deformed: Yes Improper Length and Hygiene: Yes Electronic Signature(s) Signed: 10/18/2017 4:11:09 PM By: Renne Crigler Entered By: Renne Crigler on 10/18/2017 10:48:20 Hilton, Moksha R. ([id number]) -------------------------------------------------------------------------------- Multi Wound Chart Details Patient Name: Jessica Casey R. Date of Service: 10/18/2017 10:15 AM Medical Record Number:  [id number] Patient Account Number: [id number] Date of Birth/Sex: 10-Jan-1981 (36 y.o. Female) Treating RN: Renne Crigler Primary Care Maxtyn Nuzum: PATIENT, NO Other Clinician: Referring Saiya Crist: Referral, Self Treating Enslie Sahota/Extender: Rudene Re in Treatment: 5 Vital Signs Height(in): 74 Pulse(bpm): 109 Weight(lbs): 505 Blood Pressure(mmHg): 152/92 Body Mass Index(BMI): 65 Temperature(F): 98.2 Respiratory Rate 20 (breaths/min): Photos: [1:No Photos] [2:No Photos] [N/A:N/A] Wound Location: [1:Right, Proximal, Lateral Lower Leg] [2:Right, Distal, Lateral Lower Leg] [N/A:N/A] Wounding Event: [1:Gradually Appeared] [2:Gradually Appeared] [N/A:N/A] Primary Etiology: [1:Lymphedema] [2:Lymphedema] [N/A:N/A] Date Acquired: [1:08/08/2017] [2:08/08/2017] [N/A:N/A] Weeks of Treatment: [1:5] [2:5] [N/A:N/A] Wound Status: [1:Open] [2:Open] [N/A:N/A] Measurements L x W x D [1:0.8x0.6x0.1] [2:3.2x7x0.3] [N/A:N/A] (cm)  Area (cm) : [1:0.377] [2:17.593] [N/A:N/A] Volume (cm) : [1:0.038] [2:5.278] [N/A:N/A] % Reduction in Area: [1:75.00%] [2:17.90%] [N/A:N/A] % Reduction in Volume: [1:87.40%] [2:17.90%] [N/A:N/A] Classification: [1:Full Thickness With Exposed Support Structures] [2:Full Thickness With Exposed Support Structures] [N/A:N/A] Periwound Skin Texture: [1:No Abnormalities Noted] [2:No Abnormalities Noted] [N/A:N/A] Periwound Skin Moisture: [1:No Abnormalities Noted] [2:No Abnormalities Noted] [N/A:N/A] Periwound Skin Color: [1:No Abnormalities Noted No] [2:No Abnormalities Noted No] [N/A:N/A N/A] Treatment Notes Electronic Signature(s) Signed: 10/18/2017 11:15:36 AM By: Evlyn KannerBritto, Errol MD, FACS Entered By: Evlyn KannerBritto, Errol on 10/18/2017 11:15:36 Marlaine HindLEATH, Carmelita R. ([id number]) -------------------------------------------------------------------------------- Multi-Disciplinary Care Plan Details Patient Name: Jessica CaseyLEATH, Abryanna R. Date of Service: 10/18/2017 10:15 AM Medical Record  Number: [id number] Patient Account Number: [id number] Date of Birth/Sex: December 22, 1980 (36 y.o. Female) Treating RN: Renne CriglerFlinchum, Cheryl Primary Care Sariyah Corcino: PATIENT, NO Other Clinician: Referring Dorwin Fitzhenry: Referral, Self Treating Rayder Sullenger/Extender: Rudene ReBritto, Errol Weeks in Treatment: 5 Active Inactive ` Orientation to the Wound Care Program Nursing Diagnoses: Knowledge deficit related to the wound healing center program Goals: Patient/caregiver will verbalize understanding of the Wound Healing Center Program Date Initiated: 09/10/2017 Target Resolution Date: 11/23/2017 Goal Status: Active Interventions: Provide education on orientation to the wound center Notes: ` Venous Leg Ulcer Nursing Diagnoses: Knowledge deficit related to disease process and management Goals: Patient/caregiver will verbalize understanding of disease process and disease management Date Initiated: 09/10/2017 Target Resolution Date: 11/23/2017 Goal Status: Active Interventions: Assess peripheral edema status every visit. Notes: ` Wound/Skin Impairment Nursing Diagnoses: Impaired tissue integrity Goals: Ulcer/skin breakdown will heal within 14 weeks Date Initiated: 09/10/2017 Target Resolution Date: 11/24/2017 Goal Status: Active Interventions: Marlaine HindLEATH, Nicosha R. ([id number]) Assess patient/caregiver ability to obtain necessary supplies Assess patient/caregiver ability to perform ulcer/skin care regimen upon admission and as needed Assess ulceration(s) every visit Notes: Electronic Signature(s) Signed: 10/18/2017 4:11:09 PM By: Renne CriglerFlinchum, Cheryl Entered By: Renne CriglerFlinchum, Cheryl on 10/18/2017 10:48:33 Shanks, Felita R. ([id number]) -------------------------------------------------------------------------------- Pain Assessment Details Patient Name: Jessica CaseyLEATH, Cailan R. Date of Service: 10/18/2017 10:15 AM Medical Record Number: [id number] Patient Account Number: [id number] Date of Birth/Sex: December 22, 1980 (36 y.o.  Female) Treating RN: Renne CriglerFlinchum, Cheryl Primary Care Mechelle Pates: PATIENT, NO Other Clinician: Referring Haunani Dickard: Referral, Self Treating Malaquias Lenker/Extender: Rudene ReBritto, Errol Weeks in Treatment: 5 Active Problems Location of Pain Severity and Description of Pain Patient Has Paino Yes Site Locations Pain Location: Pain in Ulcers Duration of the Pain. Constant / Intermittento Intermittent Rate the pain. Current Pain Level: 7 Character of Pain Describe the Pain: Burning Pain Management and Medication Current Pain Management: Electronic Signature(s) Signed: 10/18/2017 4:11:09 PM By: Renne CriglerFlinchum, Cheryl Entered By: Renne CriglerFlinchum, Cheryl on 10/18/2017 10:34:34 Carnevale, Shamica R. ([id number]) -------------------------------------------------------------------------------- Patient/Caregiver Education Details Patient Name: Jessica CaseyLEATH, Esteen R. Date of Service: 10/18/2017 10:15 AM Medical Record Number: [id number] Patient Account Number: [id number] Date of Birth/Gender: December 22, 1980 (36 y.o. Female) Treating RN: Renne CriglerFlinchum, Cheryl Primary Care Physician: PATIENT, NO Other Clinician: Referring Physician: Referral, Self Treating Physician/Extender: Rudene ReBritto, Errol Weeks in Treatment: 5 Education Assessment Education Provided To: Patient Education Topics Provided Wound/Skin Impairment: Handouts: Caring for Your Ulcer Methods: Explain/Verbal Responses: State content correctly Electronic Signature(s) Signed: 10/18/2017 4:11:09 PM By: Renne CriglerFlinchum, Cheryl Entered By: Renne CriglerFlinchum, Cheryl on 10/18/2017 11:36:17 Danh, Maleia R. ([id number]) -------------------------------------------------------------------------------- Wound Assessment Details Patient Name: Jessica CaseyLEATH, Ligaya R. Date of Service: 10/18/2017 10:15 AM Medical Record Number: [id number] Patient Account Number: [id number] Date of Birth/Sex: December 22, 1980 (36 y.o. Female) Treating RN: Renne CriglerFlinchum, Cheryl Primary Care Janyth Riera: PATIENT, NO Other Clinician: Referring  Arad Burston: Referral, Self Treating Dontario Evetts/Extender: Rudene ReBritto, Errol Weeks in Treatment: 5 Wound Status Wound Number: 1  Primary Etiology: Lymphedema Wound Location: Right Lower Leg - Lateral, Proximal Wound Status: Open Wounding Event: Gradually Appeared Comorbid History: Lymphedema, Hypertension Date Acquired: 08/08/2017 Weeks Of Treatment: 5 Clustered Wound: No Photos Wound Measurements Length: (cm) 0.8 Width: (cm) 0.6 Depth: (cm) 0.1 Area: (cm) 0.377 Volume: (cm) 0.038 % Reduction in Area: 75% % Reduction in Volume: 87.4% Epithelialization: None Wound Description Full Thickness With Exposed Support Classification: Structures Wound Margin: Flat and Intact Exudate Large Amount: Exudate Type: Serosanguineous Exudate Color: red, brown Foul Odor After Cleansing: Yes Due to Product Use: No Slough/Fibrino Yes Wound Bed Granulation Amount: Medium (34-66%) Exposed Structure Granulation Quality: Red Fascia Exposed: No Necrotic Amount: Medium (34-66%) Fat Layer (Subcutaneous Tissue) Exposed: Yes Necrotic Quality: Adherent Slough Tendon Exposed: No Muscle Exposed: No Joint Exposed: No Bone Exposed: No Periwound Skin Texture Westra, Lirio R. ([id number]) Texture Color No Abnormalities Noted: No No Abnormalities Noted: No Callus: No Atrophie Blanche: No Crepitus: No Cyanosis: No Excoriation: No Ecchymosis: No Induration: No Erythema: No Rash: No Hemosiderin Staining: No Scarring: No Mottled: No Pallor: No Moisture Rubor: No No Abnormalities Noted: No Dry / Scaly: No Temperature / Pain Maceration: Yes Temperature: No Abnormality Wound Preparation Ulcer Cleansing: Rinsed/Irrigated with Saline, Other: soap and water, Topical Anesthetic Applied: None Treatment Notes Wound #1 (Right, Proximal, Lateral Lower Leg) 1. Cleansed with: Clean wound with Normal Saline 2. Anesthetic Topical Lidocaine 4% cream to wound bed prior to debridement 3. Peri-wound  Care: Barrier cream Moisturizing lotion 4. Dressing Applied: Other dressing (specify in notes) 5. Secondary Dressing Applied ABD Pad 7. Secured with 4-Layer Compression System - Right Lower Extremity Notes silvercell, xtrasorb Electronic Signature(s) Signed: 10/18/2017 4:11:09 PM By: Renne Crigler Entered By: Renne Crigler on 10/18/2017 11:30:58 Watts, Tresa R. ([id number]) -------------------------------------------------------------------------------- Wound Assessment Details Patient Name: Jessica Casey R. Date of Service: 10/18/2017 10:15 AM Medical Record Number: [id number] Patient Account Number: [id number] Date of Birth/Sex: 09-Feb-1981 (36 y.o. Female) Treating RN: Renne Crigler Primary Care Ismaeel Arvelo: PATIENT, NO Other Clinician: Referring Carole Doner: Referral, Self Treating Brees Hounshell/Extender: Rudene Re in Treatment: 5 Wound Status Wound Number: 2 Primary Etiology: Lymphedema Wound Location: Right Lower Leg - Lateral, Distal Wound Status: Open Wounding Event: Gradually Appeared Comorbid History: Lymphedema, Hypertension Date Acquired: 08/08/2017 Weeks Of Treatment: 5 Clustered Wound: No Photos Wound Measurements Length: (cm) 3.2 Width: (cm) 7 Depth: (cm) 0.3 Area: (cm) 17.593 Volume: (cm) 5.278 % Reduction in Area: 17.9% % Reduction in Volume: 17.9% Epithelialization: None Wound Description Full Thickness With Exposed Support Foul Odo Classification: Structures Due to P Wound Margin: Flat and Intact Slough/F Exudate Large Amount: Exudate Type: Serosanguineous Exudate Color: red, brown r After Cleansing: Yes roduct Use: No ibrino Yes Wound Bed Granulation Amount: Medium (34-66%) Exposed Structure Granulation Quality: Red Fascia Exposed: No Necrotic Amount: Medium (34-66%) Fat Layer (Subcutaneous Tissue) Exposed: Yes Necrotic Quality: Adherent Slough Tendon Exposed: No Muscle Exposed: No Joint Exposed: No Bone Exposed:  No Periwound Skin Texture Morten, Talaysia R. ([id number]) Texture Color No Abnormalities Noted: No No Abnormalities Noted: No Callus: No Atrophie Blanche: No Crepitus: No Cyanosis: No Excoriation: No Ecchymosis: No Induration: No Erythema: No Rash: No Hemosiderin Staining: No Scarring: No Mottled: No Pallor: No Moisture Rubor: No No Abnormalities Noted: No Dry / Scaly: No Temperature / Pain Maceration: Yes Temperature: No Abnormality Wound Preparation Ulcer Cleansing: Rinsed/Irrigated with Saline, Other: soap and water, Topical Anesthetic Applied: None Treatment Notes Wound #2 (Right, Distal, Lateral Lower Leg) 1. Cleansed with: Clean wound with Normal Saline 2. Anesthetic Topical  Lidocaine 4% cream to wound bed prior to debridement 3. Peri-wound Care: Barrier cream Moisturizing lotion 4. Dressing Applied: Other dressing (specify in notes) 5. Secondary Dressing Applied ABD Pad 7. Secured with 4-Layer Compression System - Right Lower Extremity Notes silvercell, xtrasorb Electronic Signature(s) Signed: 10/18/2017 4:11:09 PM By: Renne CriglerFlinchum, Cheryl Entered By: Renne CriglerFlinchum, Cheryl on 10/18/2017 11:31:19 Salameh, Arion R. ([id number]) -------------------------------------------------------------------------------- Vitals Details Patient Name: Jessica CaseyLEATH, Marisabel R. Date of Service: 10/18/2017 10:15 AM Medical Record Number: [id number] Patient Account Number: [id number] Date of Birth/Sex: 1981-09-19 (36 y.o. Female) Treating RN: Renne CriglerFlinchum, Cheryl Primary Care Purnell Daigle: PATIENT, NO Other Clinician: Referring Cheyne Boulden: Referral, Self Treating Leah Skora/Extender: Rudene ReBritto, Errol Weeks in Treatment: 5 Vital Signs Time Taken: 10:34 Temperature (F): 98.2 Height (in): 74 Pulse (bpm): 109 Weight (lbs): 505 Respiratory Rate (breaths/min): 20 Body Mass Index (BMI): 64.8 Blood Pressure (mmHg): 152/92 Reference Range: 80 - 120 mg / dl Electronic Signature(s) Signed: 10/18/2017 4:11:09  PM By: Renne CriglerFlinchum, Cheryl Entered By: Renne CriglerFlinchum, Cheryl on 10/18/2017 10:34:58

## 2017-10-22 ENCOUNTER — Encounter: Payer: Self-pay | Attending: Physician Assistant

## 2017-10-22 DIAGNOSIS — I10 Essential (primary) hypertension: Secondary | ICD-10-CM | POA: Insufficient documentation

## 2017-10-22 DIAGNOSIS — L97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d: Secondary | ICD-10-CM | POA: Insufficient documentation

## 2017-10-22 DIAGNOSIS — Z6841 Body Mass Index (BMI) 40.0 and over, adult: Secondary | ICD-10-CM | POA: Insufficient documentation

## 2017-10-22 DIAGNOSIS — I890 Lymphedema, not elsewhere classified: Secondary | ICD-10-CM | POA: Insufficient documentation

## 2017-10-22 DIAGNOSIS — I87311 Chronic venous hypertension (idiopathic) with ulcer of right lower extremity: Secondary | ICD-10-CM | POA: Insufficient documentation

## 2017-10-22 NOTE — Progress Notes (Signed)
[name], Maleeyah R. ([id number]) Visit Report for 10/22/2017 Arrival Information Details Patient Name: [name], [name] Date of Service: 10/22/2017 8:45 AM Medical Record Number: [id number] Patient Account Number: [id number] Date of Birth/Sex: February 22, 1981 (36 y.o. Female) Treating RN: Ashok CordiaPinkerton, Debi Primary Care Romie Tay: PATIENT, NO Other Clinician: Referring Etana Beets: Referral, Self Treating Darryn Kydd/Extender: STONE III, HOYT Weeks in Treatment: 6 Visit Information History Since Last Visit All ordered tests and consults were completed: No Patient Arrived: Ambulatory Added or deleted any medications: No Arrival Time: 08:55 Any new allergies or adverse reactions: No Accompanied By: self Had a fall or experienced change in No Transfer Assistance: None activities of daily living that may affect Patient Identification Verified: Yes risk of falls: Secondary Verification Process Completed: Yes Signs or symptoms of abuse/neglect since last visito No Patient Requires Transmission-Based No Hospitalized since last visit: No Precautions: Has Dressing in Place as Prescribed: Yes Patient Has Alerts: No Has Compression in Place as Prescribed: Yes Pain Present Now: Yes Electronic Signature(s) Signed: 10/22/2017 4:03:37 PM By: Alejandro MullingPinkerton, Debra Entered By: Alejandro MullingPinkerton, Debra on 10/22/2017 08:55:27 Levitt, Rilee R. ([id number]) -------------------------------------------------------------------------------- Encounter Discharge Information Details Patient Name: [name], [name] Date of Service: 10/22/2017 8:45 AM Medical Record Number: [id number] Patient Account Number: [id number] Date of Birth/Sex: February 22, 1981 (36 y.o. Female) Treating RN: Ashok CordiaPinkerton, Debi Primary Care Camary Sosa: PATIENT, NO Other Clinician: Referring Tranise Forrest: Referral, Self Treating Arial Galligan/Extender: STONE III, HOYT Weeks in Treatment: 6 Encounter Discharge Information Items Discharge Pain Level: 2 Discharge Condition:  Stable Ambulatory Status: Ambulatory Discharge Destination: Home Private Transportation: Auto Accompanied By: self Schedule Follow-up Appointment: Yes Medication Reconciliation completed and No provided to Patient/Care Mathayus Stanbery: Clinical Summary of Care: Electronic Signature(s) Signed: 10/22/2017 4:03:37 PM By: Alejandro MullingPinkerton, Debra Entered By: Alejandro MullingPinkerton, Debra on 10/22/2017 09:20:53 [name], [name] ([id number]) -------------------------------------------------------------------------------- Patient/Caregiver Education Details Patient Name: [name], [name] Date of Service: 10/22/2017 8:45 AM Medical Record Number: [id number] Patient Account Number: [id number] Date of Birth/Gender: February 22, 1981 (36 y.o. Female) Treating RN: Ashok CordiaPinkerton, Debi Primary Care Physician: PATIENT, NO Other Clinician: Referring Physician: Referral, Self Treating Physician/Extender: Linwood DibblesSTONE III, HOYT Weeks in Treatment: 6 Education Assessment Education Provided To: Patient Education Topics Provided Wound/Skin Impairment: Handouts: Other: do not get wrap wet Methods: Demonstration, Explain/Verbal Responses: State content correctly Electronic Signature(s) Signed: 10/22/2017 4:03:37 PM By: Alejandro MullingPinkerton, Debra Entered By: Alejandro MullingPinkerton, Debra on 10/22/2017 09:20:36 Leh, Airis R. ([id number]) -------------------------------------------------------------------------------- Wound Assessment Details Patient Name: [name], [name] Date of Service: 10/22/2017 8:45 AM Medical Record Number: [id number] Patient Account Number: [id number] Date of Birth/Sex: February 22, 1981 (36 y.o. Female) Treating RN: Ashok CordiaPinkerton, Debi Primary Care Jerzy Roepke: PATIENT, NO Other Clinician: Referring Meelah Tallo: Referral, Self Treating Michalla Ringer/Extender: STONE III, HOYT Weeks in Treatment: 6 Wound Status Wound Number: 1 Primary Etiology: Lymphedema Wound Location: Right Lower Leg - Lateral, Proximal Wound Status: Open Wounding Event: Gradually  Appeared Comorbid History: Lymphedema, Hypertension Date Acquired: 08/08/2017 Weeks Of Treatment: 6 Clustered Wound: No Photos Photo Uploaded By: Alejandro MullingPinkerton, Debra on 10/22/2017 16:01:58 Wound Measurements Length: (cm) 0.7 Width: (cm) 0.6 Depth: (cm) 0.1 Area: (cm) 0.33 Volume: (cm) 0.033 % Reduction in Area: 78.1% % Reduction in Volume: 89.1% Epithelialization: None Tunneling: No Undermining: No Wound Description Full Thickness With Exposed Support Classification: Structures Wound Margin: Flat and Intact Exudate Large Amount: Exudate Type: Serosanguineous Exudate Color: red, brown Foul Odor After Cleansing: Yes Due to Product Use: No Slough/Fibrino Yes Wound Bed Granulation Amount: Medium (34-66%) Exposed Structure Granulation Quality: Red Fascia Exposed: No Necrotic Amount: Medium (34-66%) Fat Layer (Subcutaneous Tissue) Exposed: Yes Necrotic Quality: Adherent  Slough Tendon Exposed: No Muscle Exposed: No Joint Exposed: No Bone Exposed: No Agan, [name] ([id number]) Periwound Skin Texture Texture Color No Abnormalities Noted: No No Abnormalities Noted: No Callus: No Atrophie Blanche: No Crepitus: No Cyanosis: No Excoriation: No Ecchymosis: No Induration: No Erythema: No Rash: No Hemosiderin Staining: No Scarring: No Mottled: No Pallor: No Moisture Rubor: No No Abnormalities Noted: No Dry / Scaly: No Temperature / Pain Maceration: Yes Temperature: No Abnormality Wound Preparation Ulcer Cleansing: Rinsed/Irrigated with Saline, Other: soap and water, Topical Anesthetic Applied: None Treatment Notes Wound #1 (Right, Proximal, Lateral Lower Leg) 1. Cleansed with: Clean wound with Normal Saline Cleanse wound with antibacterial soap and water 3. Peri-wound Care: Barrier cream Moisturizing lotion 5. Secondary Dressing Applied ABD Pad 7. Secured with Tape 4-Layer Compression System - Right Lower Extremity Notes xtrasorb, unna to anchor,  silvercel Electronic Signature(s) Signed: 10/22/2017 4:03:37 PM By: Alejandro MullingPinkerton, Debra Entered By: Alejandro MullingPinkerton, Debra on 10/22/2017 09:00:22 Hamrick, Cristal DeerERICA R. ([id number]) -------------------------------------------------------------------------------- Wound Assessment Details Patient Name: [name], Hanan R. Date of Service: 10/22/2017 8:45 AM Medical Record Number: [id number] Patient Account Number: [id number] Date of Birth/Sex: February 14, 1981 (36 y.o. Female) Treating RN: Ashok CordiaPinkerton, Debi Primary Care Eivin Mascio: PATIENT, NO Other Clinician: Referring Kenzy Campoverde: Referral, Self Treating Editha Bridgeforth/Extender: STONE III, HOYT Weeks in Treatment: 6 Wound Status Wound Number: 2 Primary Etiology: Lymphedema Wound Location: Right Lower Leg - Lateral, Distal Wound Status: Open Wounding Event: Gradually Appeared Comorbid History: Lymphedema, Hypertension Date Acquired: 08/08/2017 Weeks Of Treatment: 6 Clustered Wound: No Photos Photo Uploaded By: Alejandro MullingPinkerton, Debra on 10/22/2017 16:02:18 Wound Measurements Length: (cm) 4.3 Width: (cm) 6.8 Depth: (cm) 0.3 Area: (cm) 22.965 Volume: (cm) 6.89 % Reduction in Area: -7.1% % Reduction in Volume: -7.1% Epithelialization: None Tunneling: No Undermining: No Wound Description Full Thickness With Exposed Support Classification: Structures Wound Margin: Flat and Intact Exudate Large Amount: Exudate Type: Serosanguineous Exudate Color: red, brown Foul Odor After Cleansing: Yes Due to Product Use: No Slough/Fibrino Yes Wound Bed Granulation Amount: Medium (34-66%) Exposed Structure Granulation Quality: Red Fascia Exposed: No Necrotic Amount: Medium (34-66%) Fat Layer (Subcutaneous Tissue) Exposed: Yes Necrotic Quality: Adherent Slough Tendon Exposed: No Muscle Exposed: No Joint Exposed: No Bone Exposed: No Luckett, Siddhi R. ([id number]) Periwound Skin Texture Texture Color No Abnormalities Noted: No No Abnormalities Noted: No Callus: No Atrophie  Blanche: No Crepitus: No Cyanosis: No Excoriation: No Ecchymosis: No Induration: No Erythema: No Rash: No Hemosiderin Staining: No Scarring: No Mottled: No Pallor: No Moisture Rubor: No No Abnormalities Noted: No Dry / Scaly: No Temperature / Pain Maceration: Yes Temperature: No Abnormality Wound Preparation Ulcer Cleansing: Rinsed/Irrigated with Saline, Other: soap and water, Topical Anesthetic Applied: None Treatment Notes Wound #2 (Right, Distal, Lateral Lower Leg) 1. Cleansed with: Clean wound with Normal Saline Cleanse wound with antibacterial soap and water 3. Peri-wound Care: Barrier cream Moisturizing lotion 5. Secondary Dressing Applied ABD Pad 7. Secured with Tape 4-Layer Compression System - Right Lower Extremity Notes xtrasorb, unna to anchor, silvercel Electronic Signature(s) Signed: 10/22/2017 4:03:37 PM By: Alejandro MullingPinkerton, Debra Entered By: Alejandro MullingPinkerton, Debra on 10/22/2017 09:01:06

## 2017-10-25 ENCOUNTER — Ambulatory Visit: Payer: Self-pay | Admitting: Surgery

## 2017-10-29 ENCOUNTER — Ambulatory Visit: Payer: Self-pay | Admitting: Surgery

## 2017-11-01 ENCOUNTER — Encounter: Payer: Self-pay | Admitting: Surgery

## 2017-11-03 NOTE — Progress Notes (Signed)
[name], [name] ([id number]) Visit Report for 11/01/2017 Chief Complaint Document Details Patient Name: [name], [name] Date of Service: 11/01/2017 2:15 PM Medical Record Number: [id number] Patient Account Number: [id number] Date of Birth/Sex: 03/01/1981 (36 y.o. Female) Treating RN: Ashok Cordia, Debi Primary Care Provider: PATIENT, NO Other Clinician: Referring Provider: Referral, Self Treating Provider/Extender: Rudene Re in Treatment: 7 Information Obtained from: Patient Chief Complaint Patient presents for treatment of an open ulcer due to venous insufficiency to the right lower extremity which she's had for about a month Electronic Signature(s) Signed: 11/01/2017 3:07:55 PM By: Evlyn Kanner MD, FACS Entered By: Evlyn Kanner on 11/01/2017 15:07:55 [name], [name] ([id number]) -------------------------------------------------------------------------------- HPI Details Patient Name: [name] R. Date of Service: 11/01/2017 2:15 PM Medical Record Number: [id number] Patient Account Number: [id number] Date of Birth/Sex: 1980/12/07 (36 y.o. Female) Treating RN: Ashok Cordia, Debi Primary Care Provider: PATIENT, NO Other Clinician: Referring Provider: Referral, Self Treating Provider/Extender: Rudene Re in Treatment: 7 History of Present Illness Location: right lower extremity lateral calf several large areas Quality: Patient reports experiencing a sharp pain to affected area(s). Severity: Patient states wound are getting worse. Duration: Patient has had the wound for < 5 weeks prior to presenting for treatment Timing: Pain in wound is Intermittent (comes and goes Context: The wound would happen gradually Modifying Factors: Other treatment(s) tried include:admission to the hospital for sepsis and a full workup with treatment with IV antibiotics and discharged on oral antibiotics Associated Signs and Symptoms: Patient reports having increase discharge. HPI  Description: 36 year old patient well known to our [facility] wound care clinic where she has been seen since 2016 for bilateral lower extremity venous insufficiency disease with lymphedema and multiple ulcerations associated with morbid obesity. she had custom-made compression stockings and lymphedema pumps which were used in the past. most recently she was admitted to the hospital between October 11 and 09/02/2017 with sepsis, lower extremity wounds and lymphedema.she was initially treated in the outpatient with Keflex and Bactrim. she was initially treated in the hospital with vancomycin and Zosyn and changed over to Unasyn until her white count improved and her blood cultures were negative for 3 days. After her inpatient management she was discharged home on Augmentin to end on 09/13/2017 with a 14 day course. she has had outpatient vascular duplex scans completed in November 2017 and her right ABI was 1.1 and the left ABI is 1.3. she had normal toe brachial indices bilaterally.she had three-vessel runoff in the right lower extremity and two-vessel runoff in the left lower extremity. On questioning the patient she does have custom made compression stockings and also has a lymphedema pump but has not been using it appropriately and has not been taking good care of herself. 09/17/2017 -- she returns today with compression stockings on the left side and the right side has had significant amount of drainage and has a very strong odor 09/24/2017 -- the drainage is increased significantly and she has more lymphedema and a very strong odor to her wound. Though she does not have systemic symptoms, or overt infection I believe she will benefit from some doxycycline given empirically. 10/01/2017 -- after starting the doxycycline and changing the dressing twice a week her symptoms and signs have definitely improved overall. 10/08/2017 -- she has completed her course of doxycycline but continues to have  a lot of drainage and needs twice a week dressing changes. Electronic Signature(s) Signed: 11/01/2017 3:08:05 PM By: Evlyn Kanner MD, FACS Entered By: Evlyn Kanner on 11/01/2017 15:08:05 [name],  [name] ([id number]) -------------------------------------------------------------------------------- Physical Exam Details Patient Name: [name], [name] Date of Service: 11/01/2017 2:15 PM Medical Record Number: [id number] Patient Account Number: [id number] Date of Birth/Sex: 1981/10/23 (36 y.o. Female) Treating RN: Ashok Cordia, Debi Primary Care Provider: PATIENT, NO Other Clinician: Referring Provider: Referral, Self Treating Provider/Extender: Rudene Re in Treatment: 7 Constitutional . Pulse regular. Respirations normal and unlabored. Afebrile. . Eyes Nonicteric. Reactive to light. Ears, Nose, Mouth, and Throat Lips, teeth, and gums WNL.Marland Kitchen Moist mucosa without lesions. Neck supple and nontender. No palpable supraclavicular or cervical adenopathy. Normal sized without goiter. Respiratory WNL. No retractions.. Cardiovascular Pedal Pulses WNL. No clubbing, cyanosis or edema. Lymphatic No adneopathy. No adenopathy. No adenopathy. Musculoskeletal Adexa without tenderness or enlargement.. Digits and nails w/o clubbing, cyanosis, infection, petechiae, ischemia, or inflammatory conditions.. Integumentary (Hair, Skin) No suspicious lesions. No crepitus or fluctuance. No peri-wound warmth or erythema. No masses.Marland Kitchen Psychiatric Judgement and insight Intact.. No evidence of depression, anxiety, or agitation.. Notes the lymphedema is better controlled and the wounds are looking cleaner and there is no odor. No sharp debridement was required today. Electronic Signature(s) Signed: 11/01/2017 3:08:46 PM By: Evlyn Kanner MD, FACS Entered By: Evlyn Kanner on 11/01/2017 15:08:45 [name]  ([id number]) -------------------------------------------------------------------------------- Physician Orders Details Patient Name: [name] R. Date of Service: 11/01/2017 2:15 PM Medical Record Number: [id number] Patient Account Number: [id number] Date of Birth/Sex: 1980-12-07 (36 y.o. Female) Treating RN: Ashok Cordia, Debi Primary Care Provider: PATIENT, NO Other Clinician: Referring Provider: Referral, Self Treating Provider/Extender: Rudene Re in Treatment: 7 Verbal / Phone Orders: Yes Clinician: Ashok Cordia, Debi Read Back and Verified: Yes Diagnosis Coding Wound Cleansing Wound #1 Right,Proximal,Lateral Lower Leg o Clean wound with Normal Saline. o Cleanse wound with mild soap and water Wound #2 Right,Distal,Lateral Lower Leg o Clean wound with Normal Saline. o Cleanse wound with mild soap and water Anesthetic (add to Medication List) Wound #1 Right,Proximal,Lateral Lower Leg o Topical Lidocaine 4% cream applied to wound bed prior to debridement (In Clinic Only). Wound #2 Right,Distal,Lateral Lower Leg o Topical Lidocaine 4% cream applied to wound bed prior to debridement (In Clinic Only). Skin Barriers/Peri-Wound Care Wound #1 Right,Proximal,Lateral Lower Leg o Barrier cream o Moisturizing lotion Wound #2 Right,Distal,Lateral Lower Leg o Barrier cream o Moisturizing lotion Primary Wound Dressing Wound #1 Right,Proximal,Lateral Lower Leg o Silvercel Non-Adherent Wound #2 Right,Distal,Lateral Lower Leg o Silvercel Non-Adherent Secondary Dressing Wound #1 Right,Proximal,Lateral Lower Leg o ABD pad o XtraSorb Wound #2 Right,Distal,Lateral Lower Leg o ABD pad o XtraSorb Dressing Change Frequency Wound #1 Right,Proximal,Lateral Lower Leg [name], [name] ([id number]) o Dressing is to be changed Monday and Thursday. Wound #2 Right,Distal,Lateral Lower Leg o Dressing is to be changed Monday and Thursday. Follow-up  Appointments o Return Appointment in 2 weeks. o Nurse Visit as needed Edema Control Wound #1 Right,Proximal,Lateral Lower Leg o 4-Layer Compression System - Right Lower Extremity - unna to anchor o Elevate legs to the level of the heart and pump ankles as often as possible Wound #2 Right,Distal,Lateral Lower Leg o 4-Layer Compression System - Right Lower Extremity - unna to anchor o Elevate legs to the level of the heart and pump ankles as often as possible Additional Orders / Instructions Wound #1 Right,Proximal,Lateral Lower Leg o Vitamin A; Vitamin C, Zinc o Increase protein intake. o Other: - Please watch your salt (sodium) intake Wound #2 Right,Distal,Lateral Lower Leg o Vitamin A; Vitamin C, Zinc o Increase protein intake. o Other: - Please watch your salt (sodium) intake  Electronic Signature(s) Signed: 11/01/2017 3:15:43 PM By: Evlyn KannerBritto, Lorelai Huyser MD, FACS Signed: 11/01/2017 4:48:36 PM By: Alejandro MullingPinkerton, Debra Entered By: Alejandro MullingPinkerton, Debra on 11/01/2017 15:02:56 [name], [name] ([id number]) -------------------------------------------------------------------------------- Problem List Details Patient Name: Jessica CaseyLEATH, Yomara R. Date of Service: 11/01/2017 2:15 PM Medical Record Number: [id number] Patient Account Number: [id number] Date of Birth/Sex: 07-25-1981 (36 y.o. Female) Treating RN: Ashok CordiaPinkerton, Debi Primary Care Provider: PATIENT, NO Other Clinician: Referring Provider: Referral, Self Treating Provider/Extender: Rudene ReBritto, Berlie Hatchel Weeks in Treatment: 7 Active Problems ICD-10 Encounter Code Description Active Date Diagnosis L97.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d 09/10/2017 Yes I89.0 Lymphedema, not elsewhere classified 09/10/2017 Yes I87.311 Chronic venous hypertension (idiopathic) with ulcer of right lower 09/10/2017 Yes extremity E66.01 Morbid (severe) obesity due to excess calories 09/10/2017 Yes Inactive Problems Resolved  Problems Electronic Signature(s) Signed: 11/01/2017 3:07:37 PM By: Evlyn KannerBritto, Waymon Laser MD, FACS Entered By: Evlyn KannerBritto, Chantrell Apsey on 11/01/2017 15:07:37 [name], [name] ([id number]) -------------------------------------------------------------------------------- Progress Note Details Patient Name: Jessica CaseyLEATH, [name] Date of Service: 11/01/2017 2:15 PM Medical Record Number: [id number] Patient Account Number: [id number] Date of Birth/Sex: 07-25-1981 (36 y.o. Female) Treating RN: Ashok CordiaPinkerton, Debi Primary Care Provider: PATIENT, NO Other Clinician: Referring Provider: Referral, Self Treating Provider/Extender: Rudene ReBritto, Serrita Lueth Weeks in Treatment: 7 Subjective Chief Complaint Information obtained from Patient Patient presents for treatment of an open ulcer due to venous insufficiency to the right lower extremity which she's had for about a month History of Present Illness (HPI) The following HPI elements were documented for the patient's wound: Location: right lower extremity lateral calf several large areas Quality: Patient reports experiencing a sharp pain to affected area(s). Severity: Patient states wound are getting worse. Duration: Patient has had the wound for < 5 weeks prior to presenting for treatment Timing: Pain in wound is Intermittent (comes and goes Context: The wound would happen gradually Modifying Factors: Other treatment(s) tried include:admission to the hospital for sepsis and a full workup with treatment with IV antibiotics and discharged on oral antibiotics Associated Signs and Symptoms: Patient reports having increase discharge. 36 year old patient well [facility] where she has been seen since 2016 for bilateral lower extremity venous insufficiency disease with lymphedema and multiple ulcerations associated with morbid obesity. she had custom-made compression stockings and lymphedema pumps which were used in the past. most recently she was admitted to the  hospital between October 11 and 09/02/2017 with sepsis, lower extremity wounds and lymphedema.she was initially treated in the outpatient with Keflex and Bactrim. she was initially treated in the hospital with vancomycin and Zosyn and changed over to Unasyn until her white count improved and her blood cultures were negative for 3 days. After her inpatient management she was discharged home on Augmentin to end on 09/13/2017 with a 14 day course. she has had outpatient vascular duplex scans completed in November 2017 and her right ABI was 1.1 and the left ABI is 1.3. she had normal toe brachial indices bilaterally.she had three-vessel runoff in the right lower extremity and two-vessel runoff in the left lower extremity. On questioning the patient she does have custom made compression stockings and also has a lymphedema pump but has not been using it appropriately and has not been taking good care of herself. 09/17/2017 -- she returns today with compression stockings on the left side and the right side has had significant amount of drainage and has a very strong odor 09/24/2017 -- the drainage is increased significantly and she has more lymphedema and a very strong  odor to her wound. Though she does not have systemic symptoms, or overt infection I believe she will benefit from some doxycycline given empirically. 10/01/2017 -- after starting the doxycycline and changing the dressing twice a week her symptoms and signs have definitely improved overall. 10/08/2017 -- she has completed her course of doxycycline but continues to have a lot of drainage and needs twice a week dressing changes. [name], GIAMBRA ([id number]) Patient History Information obtained from Patient. Family History Kidney Disease - Mother, No family history of Cancer, Diabetes, Heart Disease, Hereditary Spherocytosis, Hypertension, Lung Disease, Seizures, Stroke, Thyroid Problems, Tuberculosis. Social History Never smoker,  Marital Status - Married, Alcohol Use - Never, Drug Use - No History, Caffeine Use - Daily. Objective Constitutional Pulse regular. Respirations normal and unlabored. Afebrile. Vitals Time Taken: 2:31 PM, Height: 74 in, Weight: 505 lbs, BMI: 64.8, Temperature: 98.3 F, Pulse: 91 bpm, Respiratory Rate: 20 breaths/min, Blood Pressure: 132/73 mmHg. Eyes Nonicteric. Reactive to light. Ears, Nose, Mouth, and Throat Lips, teeth, and gums WNL.Marland Kitchen Moist mucosa without lesions. Neck supple and nontender. No palpable supraclavicular or cervical adenopathy. Normal sized without goiter. Respiratory WNL. No retractions.. Cardiovascular Pedal Pulses WNL. No clubbing, cyanosis or edema. Lymphatic No adneopathy. No adenopathy. No adenopathy. Musculoskeletal Adexa without tenderness or enlargement.. Digits and nails w/o clubbing, cyanosis, infection, petechiae, ischemia, or inflammatory conditions.Marland Kitchen Psychiatric Judgement and insight Intact.. No evidence of depression, anxiety, or agitation.. General Notes: the lymphedema is better controlled and the wounds are looking cleaner and there is no odor. No sharp debridement was required today. [name], [name] ([id number]) Integumentary (Hair, Skin) No suspicious lesions. No crepitus or fluctuance. No peri-wound warmth or erythema. No masses.. Wound #1 status is Open. Original cause of wound was Gradually Appeared. The wound is located on the Right,Proximal,Lateral Lower Leg. The wound measures 0.5cm length x 0.6cm width x 0.1cm depth; 0.236cm^2 area and 0.024cm^3 volume. There is Fat Layer (Subcutaneous Tissue) Exposed exposed. There is no tunneling or undermining noted. There is a large amount of serosanguineous drainage noted. Foul odor after cleansing was noted. The wound margin is flat and intact. There is large (67-100%) red granulation within the wound bed. There is a small (1-33%) amount of necrotic tissue within the wound bed including Adherent  Slough. The periwound skin appearance did not exhibit: Callus, Crepitus, Excoriation, Induration, Rash, Scarring, Dry/Scaly, Maceration, Atrophie Blanche, Cyanosis, Ecchymosis, Hemosiderin Staining, Mottled, Pallor, Rubor, Erythema. Periwound temperature was noted as No Abnormality. The periwound has tenderness on palpation. Wound #2 status is Open. Original cause of wound was Gradually Appeared. The wound is located on the Right,Distal,Lateral Lower Leg. The wound measures 4cm length x 6.5cm width x 0.3cm depth; 20.42cm^2 area and 6.126cm^3 volume. There is Fat Layer (Subcutaneous Tissue) Exposed exposed. There is no tunneling or undermining noted. There is a large amount of serosanguineous drainage noted. Foul odor after cleansing was noted. The wound margin is flat and intact. There is medium (34-66%) red granulation within the wound bed. There is a medium (34-66%) amount of necrotic tissue within the wound bed including Adherent Slough. The periwound skin appearance exhibited: Maceration. The periwound skin appearance did not exhibit: Callus, Crepitus, Excoriation, Induration, Rash, Scarring, Dry/Scaly, Atrophie Blanche, Cyanosis, Ecchymosis, Hemosiderin Staining, Mottled, Pallor, Rubor, Erythema. Periwound temperature was noted as No Abnormality. The periwound has tenderness on palpation. Assessment Active Problems ICD-10 L97.212 - 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d I89.0 - Lymphedema, not elsewhere classified I87.311 - Chronic venous hypertension (idiopathic) with  ulcer of right lower extremity E66.01 - Morbid (severe) obesity due to excess calories Plan Wound Cleansing: Wound #1 Right,Proximal,Lateral Lower Leg: Clean wound with Normal Saline. Cleanse wound with mild soap and water Wound #2 Right,Distal,Lateral Lower Leg: Clean wound with Normal Saline. Cleanse wound with mild soap and water Anesthetic (add to Medication List): Wound #1  Right,Proximal,Lateral Lower Leg: Topical Lidocaine 4% cream applied to wound bed prior to debridement (In Clinic Only). Wound #2 Right,Distal,Lateral Lower Leg: Topical Lidocaine 4% cream applied to wound bed prior to debridement (In Clinic Only). Skin Barriers/Peri-Wound Care: Wound #1 Right,Proximal,Lateral Lower Leg: Barrier cream [name], [name] ([id number]) Moisturizing lotion Wound #2 Right,Distal,Lateral Lower Leg: Barrier cream Moisturizing lotion Primary Wound Dressing: Wound #1 Right,Proximal,Lateral Lower Leg: Silvercel Non-Adherent Wound #2 Right,Distal,Lateral Lower Leg: Silvercel Non-Adherent Secondary Dressing: Wound #1 Right,Proximal,Lateral Lower Leg: ABD pad XtraSorb Wound #2 Right,Distal,Lateral Lower Leg: ABD pad XtraSorb Dressing Change Frequency: Wound #1 Right,Proximal,Lateral Lower Leg: Dressing is to be changed Monday and Thursday. Wound #2 Right,Distal,Lateral Lower Leg: Dressing is to be changed Monday and Thursday. Follow-up Appointments: Return Appointment in 2 weeks. Nurse Visit as needed Edema Control: Wound #1 Right,Proximal,Lateral Lower Leg: 4-Layer Compression System - Right Lower Extremity - unna to anchor Elevate legs to the level of the heart and pump ankles as often as possible Wound #2 Right,Distal,Lateral Lower Leg: 4-Layer Compression System - Right Lower Extremity - unna to anchor Elevate legs to the level of the heart and pump ankles as often as possible Additional Orders / Instructions: Wound #1 Right,Proximal,Lateral Lower Leg: Vitamin A; Vitamin C, Zinc Increase protein intake. Other: - Please watch your salt (sodium) intake Wound #2 Right,Distal,Lateral Lower Leg: Vitamin A; Vitamin C, Zinc Increase protein intake. Other: - Please watch your salt (sodium) intake there has been good improvement over the last week and I have recommended: 1. Silver alginate with a 4 layer compression wrap to the right lower extremity - we  will also use some extrasorb and some carboflex. She will need a nurse visit during the week -- on Monday. 2. Use her custom built compression stockings of the left lower extremity 3. Lymphedema pumps to be utilized twice a day for an hour each 4. Elevation and exercises been discussed with her in great detail 5. Adequate protein, vitamin A, vitamin C and zinc 6. I have discussed her diet and the need to watch her salt and fluid intake and she says she will be careful 7. Regular visits to the wound center Electronic Signature(s) Jessica HindLEATH, Ayliana R. ([id number]) Signed: 11/01/2017 3:09:46 PM By: Evlyn KannerBritto, Dakhari Zuver MD, FACS Entered By: Evlyn KannerBritto, Guila Owensby on 11/01/2017 15:09:45 Jessica HindLEATH, [name] ([id number]) -------------------------------------------------------------------------------- ROS/PFSH Details Patient Name: Jessica CaseyLEATH, Delailah R. Date of Service: 11/01/2017 2:15 PM Medical Record Number: [id number] Patient Account Number: [id number] Date of Birth/Sex: Aug 18, 1981 (36 y.o. Female) Treating RN: Ashok CordiaPinkerton, Debi Primary Care Provider: PATIENT, NO Other Clinician: Referring Provider: Referral, Self Treating Provider/Extender: Rudene ReBritto, Hiram Mciver Weeks in Treatment: 7 Information Obtained From Patient Wound History Do you currently have one or more open woundso Yes How many open wounds do you currently haveo 1 Approximately how long have you had your woundso 1 month How have you been treating your wound(s) until nowo aquacel ag Has your wound(s) ever healed and then re-openedo No Have you had any lab work done in the past montho No Have you tested positive for an antibiotic resistant organism (MRSA, VRE)o No Have you tested positive for osteomyelitis (bone infection)o No Have you had any  tests for circulation on your legso Yes Who ordered the testo G [facility] Where was the test doneo [facility] Hematologic/Lymphatic Medical History: Positive for: Lymphedema Respiratory Medical History: Negative for: Aspiration;  Asthma; Chronic Obstructive Pulmonary Disease (COPD); Pneumothorax; Sleep Apnea; Tuberculosis Cardiovascular Medical History: Positive for: Hypertension Negative for: Angina; Arrhythmia; Congestive Heart Failure; Coronary Artery Disease; Deep Vein Thrombosis; Hypotension; Myocardial Infarction; Peripheral Arterial Disease; Peripheral Venous Disease; Phlebitis; Vasculitis Immunizations Pneumococcal Vaccine: Received Pneumococcal Vaccination: No Immunization Notes: up to date Implantable Devices Family and Social History Cancer: No; Diabetes: No; Heart Disease: No; Hereditary Spherocytosis: No; Hypertension: No; Kidney Disease: Yes - Mother; Lung Disease: No; Seizures: No; Stroke: No; Thyroid Problems: No; Tuberculosis: No; Never smoker; Marital Status - Married; Alcohol Use: Never; Drug Use: No History; Caffeine Use: Daily; Financial Concerns: No; Food, Clothing or Shelter Needs: No; Support System Lacking: No; Transportation Concerns: No; Advanced Directives: No; Patient does not want information on Advanced Directives NATHALIE, CAVENDISH ([id number]) Physician Affirmation I have reviewed and agree with the above information. Electronic Signature(s) Signed: 11/01/2017 3:15:43 PM By: Evlyn Kanner MD, FACS Signed: 11/01/2017 4:48:36 PM By: [name] Entered By: Evlyn Kanner on 11/01/2017 15:08:13 Sequeira, Mallori R. ([id number]) -------------------------------------------------------------------------------- SuperBill Details Patient Name: [name] R. Date of Service: 11/01/2017 Medical Record Number: [id number] Patient Account Number: [id number] Date of Birth/Sex: 15-Apr-1981 (36 y.o. Female) Treating RN: Ashok Cordia, Debi Primary Care Provider: PATIENT, NO Other Clinician: Referring Provider: Referral, Self Treating Provider/Extender: Rudene Re in Treatment: 7 Diagnosis Coding ICD-10 Codes Code Description [phone] Non-pressure chronic ulcer of right calf with fat  layer exposed I89.0 Lymphedema, not elsewhere classified I87.311 Chronic venous hypertension (idiopathic) with ulcer of right lower extremity E66.01 Morbid (severe) obesity due to excess calories Facility Procedures CPT4 Code Description: [id number] (Facility Use Only) [phone] - APPLY MULTLAY COMPRS LWR RT LEG Modifier: Quantity: 1 Physician Procedures CPT4 Code: [id number] Description: 99213 - WC PHYS LEVEL 3 - EST PT ICD-10 Diagnosis Description L97.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 I89.0 Lymphedema, not elsewhere classified I87.311 Chronic venous hypertension (idiopathic) with ulcer of  right l E66.01 Morbid (severe) obesity due to excess calories Modifier: d ower extremity Quantity: 1 Electronic Signature(s) Signed: 11/01/2017 4:38:14 PM By: [name] Signed: 11/02/2017 12:55:29 PM By: Evlyn Kanner MD, FACS Previous Signature: 11/01/2017 3:10:05 PM Version By: Evlyn Kanner MD, FACS Entered By: [name] on 11/01/2017 16:38:14

## 2017-11-06 NOTE — Progress Notes (Signed)
[name], Tonye R. ([id number]) Visit Report for 11/05/2017 Arrival Information Details Patient Name: [name], [name] Date of Service: 11/05/2017 8:45 AM Medical Record Number: [id number] Patient Account Number: [id number] Date of Birth/Sex: 1981/02/05 (36 y.o. Female) Treating RN: Ashok CordiaPinkerton, Debi Primary Care Rubby Barbary: PATIENT, NO Other Clinician: Referring Joeanne Robicheaux: Referral, Self Treating Jaxen Samples/Extender: Rudene ReBritto, Errol Weeks in Treatment: 8 Visit Information History Since Last Visit All ordered tests and consults were completed: No Patient Arrived: Ambulatory Added or deleted any medications: No Arrival Time: 09:00 Any new allergies or adverse reactions: No Accompanied By: self Had a fall or experienced change in No Transfer Assistance: None activities of daily living that may affect Patient Identification Verified: Yes risk of falls: Secondary Verification Process Completed: Yes Signs or symptoms of abuse/neglect since last visito No Patient Requires Transmission-Based No Hospitalized since last visit: No Precautions: Has Dressing in Place as Prescribed: Yes Patient Has Alerts: No Has Compression in Place as Prescribed: Yes Pain Present Now: Yes Electronic Signature(s) Signed: 11/05/2017 5:07:23 PM By: Alejandro MullingPinkerton, Debra Entered By: Alejandro MullingPinkerton, Debra on 11/05/2017 09:00:53 [name], Marice R. ([id number]) -------------------------------------------------------------------------------- Encounter Discharge Information Details Patient Name: [name], [name] Date of Service: 11/05/2017 8:45 AM Medical Record Number: [id number] Patient Account Number: [id number] Date of Birth/Sex: 1981/02/05 (36 y.o. Female) Treating RN: Ashok CordiaPinkerton, Debi Primary Care Frankey Botting: PATIENT, NO Other Clinician: Referring Kursten Kruk: Referral, Self Treating Loyda Costin/Extender: Rudene ReBritto, Errol Weeks in Treatment: 8 Encounter Discharge Information Items Discharge Pain Level: 3 Discharge Condition:  Stable Ambulatory Status: Ambulatory Discharge Destination: Home Private Transportation: Auto Accompanied By: self Schedule Follow-up Appointment: Yes Medication Reconciliation completed and No provided to Patient/Care Zeda Gangwer: Clinical Summary of Care: Electronic Signature(s) Signed: 11/05/2017 5:07:23 PM By: Alejandro MullingPinkerton, Debra Entered By: Alejandro MullingPinkerton, Debra on 11/05/2017 09:26:06 [name], Iceis R. ([id number]) -------------------------------------------------------------------------------- Patient/Caregiver Education Details Patient Name: [name], [name] Date of Service: 11/05/2017 8:45 AM Medical Record Number: [id number] Patient Account Number: [id number] Date of Birth/Gender: 1981/02/05 (36 y.o. Female) Treating RN: Ashok CordiaPinkerton, Debi Primary Care Physician: PATIENT, NO Other Clinician: Referring Physician: Referral, Self Treating Physician/Extender: Rudene ReBritto, Errol Weeks in Treatment: 8 Education Assessment Education Provided To: Patient Education Topics Provided Wound/Skin Impairment: Handouts: Other: do not get wrap wet Methods: Demonstration, Explain/Verbal Responses: State content correctly Electronic Signature(s) Signed: 11/05/2017 5:07:23 PM By: Alejandro MullingPinkerton, Debra Entered By: Alejandro MullingPinkerton, Debra on 11/05/2017 09:25:53 Karapetyan, Dahna R. ([id number]) -------------------------------------------------------------------------------- Wound Assessment Details Patient Name: [name], [name] Date of Service: 11/05/2017 8:45 AM Medical Record Number: [id number] Patient Account Number: [id number] Date of Birth/Sex: 1981/02/05 (36 y.o. Female) Treating RN: Ashok CordiaPinkerton, Debi Primary Care Almedia Cordell: PATIENT, NO Other Clinician: Referring [name]: Referral, Self Treating Destani Wamser/Extender: Rudene ReBritto, Errol Weeks in Treatment: 8 Wound Status Wound Number: 1 Primary Etiology: Lymphedema Wound Location: Right Lower Leg - Lateral, Proximal Wound Status: Open Wounding Event: Gradually  Appeared Comorbid History: Lymphedema, Hypertension Date Acquired: 08/08/2017 Weeks Of Treatment: 8 Clustered Wound: No Photos Photo Uploaded By: Alejandro MullingPinkerton, Debra on 11/05/2017 16:05:17 Wound Measurements Length: (cm) 0.5 Width: (cm) 0.5 Depth: (cm) 0.1 Area: (cm) 0.196 Volume: (cm) 0.02 % Reduction in Area: 87% % Reduction in Volume: 93.4% Epithelialization: None Tunneling: No Undermining: No Wound Description Full Thickness With Exposed Support Classification: Structures Wound Margin: Flat and Intact Exudate Large Amount: Exudate Type: Serosanguineous Exudate Color: red, brown Foul Odor After Cleansing: Yes Due to Product Use: No Slough/Fibrino Yes Wound Bed Granulation Amount: Large (67-100%) Exposed Structure Granulation Quality: Red Fascia Exposed: No Necrotic Amount: Small (1-33%) Fat Layer (Subcutaneous Tissue) Exposed: Yes Necrotic Quality: Adherent Slough Tendon Exposed: No  Muscle Exposed: No Joint Exposed: No Bone Exposed: No [name], [name] ([id number]) Periwound Skin Texture Texture Color No Abnormalities Noted: No No Abnormalities Noted: No Callus: No Atrophie Blanche: No Crepitus: No Cyanosis: No Excoriation: No Ecchymosis: No Induration: No Erythema: No Rash: No Hemosiderin Staining: No Scarring: No Mottled: No Pallor: No Moisture Rubor: No No Abnormalities Noted: No Dry / Scaly: No Temperature / Pain Maceration: No Temperature: No Abnormality Tenderness on Palpation: Yes Wound Preparation Ulcer Cleansing: Rinsed/Irrigated with Saline, Other: soap and water, Topical Anesthetic Applied: None Treatment Notes Wound #1 (Right, Proximal, Lateral Lower Leg) 1. Cleansed with: Clean wound with Normal Saline Cleanse wound with antibacterial soap and water 3. Peri-wound Care: Barrier cream Moisturizing lotion 4. Dressing Applied: Other dressing (specify in notes) 5. Secondary Dressing Applied ABD Pad Dry Gauze 7. Secured  with Tape 4-Layer Compression System - Right Lower Extremity Notes unna to anchor, silvercel, charcoal, xtrasorb Electronic Signature(s) Signed: 11/05/2017 5:07:23 PM By: Alejandro MullingPinkerton, Debra Entered By: Alejandro MullingPinkerton, Debra on 11/05/2017 09:01:31 Cieslak, Briony R. ([id number]) -------------------------------------------------------------------------------- Wound Assessment Details Patient Name: [name], Guilianna R. Date of Service: 11/05/2017 8:45 AM Medical Record Number: [id number] Patient Account Number: [id number] Date of Birth/Sex: Dec 08, 1980 (36 y.o. Female) Treating RN: Ashok CordiaPinkerton, Debi Primary Care Jisell Majer: PATIENT, NO Other Clinician: Referring Marelin Tat: Referral, Self Treating Corde Antonini/Extender: Rudene ReBritto, Errol Weeks in Treatment: 8 Wound Status Wound Number: 2 Primary Etiology: Lymphedema Wound Location: Right Lower Leg - Lateral, Distal Wound Status: Open Wounding Event: Gradually Appeared Comorbid History: Lymphedema, Hypertension Date Acquired: 08/08/2017 Weeks Of Treatment: 8 Clustered Wound: No Photos Photo Uploaded By: Alejandro MullingPinkerton, Debra on 11/05/2017 16:05:17 Wound Measurements Length: (cm) 4 Width: (cm) 6.5 Depth: (cm) 0.3 Area: (cm) 20.42 Volume: (cm) 6.126 % Reduction in Area: 4.8% % Reduction in Volume: 4.8% Epithelialization: None Tunneling: No Undermining: No Wound Description Full Thickness With Exposed Support Classification: Structures Wound Margin: Flat and Intact Exudate Large Amount: Exudate Type: Serosanguineous Exudate Color: red, brown Foul Odor After Cleansing: Yes Due to Product Use: No Slough/Fibrino Yes Wound Bed Granulation Amount: Medium (34-66%) Exposed Structure Granulation Quality: Red Fascia Exposed: No Necrotic Amount: Medium (34-66%) Fat Layer (Subcutaneous Tissue) Exposed: Yes Necrotic Quality: Adherent Slough Tendon Exposed: No Muscle Exposed: No Joint Exposed: No Bone Exposed: No Schomer, Lynett R. ([id number]) Periwound  Skin Texture Texture Color No Abnormalities Noted: No No Abnormalities Noted: No Callus: No Atrophie Blanche: No Crepitus: No Cyanosis: No Excoriation: No Ecchymosis: No Induration: No Erythema: No Rash: No Hemosiderin Staining: No Scarring: No Mottled: No Pallor: No Moisture Rubor: No No Abnormalities Noted: No Dry / Scaly: No Temperature / Pain Maceration: Yes Temperature: No Abnormality Tenderness on Palpation: Yes Wound Preparation Ulcer Cleansing: Rinsed/Irrigated with Saline, Other: soap and water, Topical Anesthetic Applied: None Treatment Notes Wound #2 (Right, Distal, Lateral Lower Leg) 1. Cleansed with: Clean wound with Normal Saline Cleanse wound with antibacterial soap and water 3. Peri-wound Care: Barrier cream Moisturizing lotion 4. Dressing Applied: Other dressing (specify in notes) 5. Secondary Dressing Applied ABD Pad Dry Gauze 7. Secured with Tape 4-Layer Compression System - Right Lower Extremity Notes unna to anchor, silvercel, charcoal, xtrasorb Electronic Signature(s) Signed: 11/05/2017 5:07:23 PM By: Alejandro MullingPinkerton, Debra Entered By: Alejandro MullingPinkerton, Debra on 11/05/2017 09:01:41

## 2017-11-06 NOTE — Progress Notes (Signed)
ROLENE, ANDRADES ([id number]) Visit Report for 11/01/2017 Arrival Information Details Patient Name: [name], [name] Date of Service: 11/01/2017 2:15 PM Medical Record Number: [id number] Patient Account Number: [id number] Date of Birth/Sex: 1981-02-02 (36 y.o. Female) Treating RN: Ashok Cordia, Debi Primary Care Delcie Ruppert: PATIENT, NO Other Clinician: Referring Montserrath Madding: Referral, Self Treating Rukaya Kleinschmidt/Extender: Rudene Re in Treatment: 7 Visit Information History Since Last Visit All ordered tests and consults were completed: No Patient Arrived: Ambulatory Added or deleted any medications: No Arrival Time: 14:30 Any new allergies or adverse reactions: No Accompanied By: self Had a fall or experienced change in No Transfer Assistance: None activities of daily living that may affect Patient Identification Verified: Yes risk of falls: Secondary Verification Process Completed: Yes Signs or symptoms of abuse/neglect since last visito No Patient Requires Transmission-Based No Hospitalized since last visit: No Precautions: Has Dressing in Place as Prescribed: Yes Patient Has Alerts: No Has Compression in Place as Prescribed: Yes Pain Present Now: No Electronic Signature(s) Signed: 11/01/2017 4:48:36 PM By: [name] Entered By: [name] on 11/01/2017 14:30:59 [name], [name] ([id number]) -------------------------------------------------------------------------------- Encounter Discharge Information Details Patient Name: Jessica Casey R. Date of Service: 11/01/2017 2:15 PM Medical Record Number: [id number] Patient Account Number: [id number] Date of Birth/Sex: [birth year] (36 y.o. Female) Treating RN: Ashok Cordia, Debi Primary Care Shantoya Geurts: PATIENT, NO Other Clinician: Referring Niza Soderholm: Referral, Self Treating Rawleigh Rode/Extender: Rudene Re in Treatment: 7 Encounter Discharge Information Items Discharge Pain Level: 0 Discharge Condition:  Stable Ambulatory Status: Ambulatory Discharge Destination: Home Transportation: Private Auto Accompanied By: self Schedule Follow-up Appointment: Yes Medication Reconciliation completed and No provided to Patient/Care Jessabelle Markiewicz: Provided on Clinical Summary of Care: 11/01/2017 Form Type Recipient Paper Patient EL Electronic Signature(s) Signed: 11/06/2017 11:15:46 AM By: Gwenlyn Perking Entered By: Gwenlyn Perking on 11/01/2017 15:21:18 Grondahl, Viki R. ([id number]) -------------------------------------------------------------------------------- Lower Extremity Assessment Details Patient Name: Jessica Casey R. Date of Service: 11/01/2017 2:15 PM Medical Record Number: [id number] Patient Account Number: [id number] Date of Birth/Sex: 1980/12/04 (36 y.o. Female) Treating RN: Ashok Cordia, Debi Primary Care Tyauna Lacaze: PATIENT, NO Other Clinician: Referring Kalli Greenfield: Referral, Self Treating Abrial Arrighi/Extender: Rudene Re in Treatment: 7 Edema Assessment Assessed: [Left: No] [Right: No] [Left: Edema] [Right: :] Calf Left: Right: Point of Measurement: 35 cm From Medial Instep cm 58.8 cm Ankle Left: Right: Point of Measurement: 9 cm From Medial Instep cm 37 cm Vascular Assessment Pulses: Dorsalis Pedis Palpable: [Right:Yes] Posterior Tibial Extremity colors, hair growth, and conditions: Extremity Color: [Right:Hyperpigmented] Temperature of Extremity: [Right:Warm] Capillary Refill: [Right:< 3 seconds] Toe Nail Assessment Left: Right: Thick: Yes Discolored: Yes Deformed: Yes Improper Length and Hygiene: Yes Electronic Signature(s) Signed: 11/01/2017 4:48:36 PM By: [name] Entered By: [name] on 11/01/2017 14:45:04 [name], [name] ([id number]) -------------------------------------------------------------------------------- Multi Wound Chart Details Patient Name: Jessica Casey R. Date of Service: 11/01/2017 2:15 PM Medical Record Number: [id number] Patient  Account Number: [id number] Date of Birth/Sex: January 27, 1981 (36 y.o. Female) Treating RN: Ashok Cordia, Debi Primary Care Gayatri Teasdale: PATIENT, NO Other Clinician: Referring Jakyrah Holladay: Referral, Self Treating Dillyn Menna/Extender: Rudene Re in Treatment: 7 Vital Signs Height(in): 74 Pulse(bpm): 91 Weight(lbs): 505 Blood Pressure(mmHg): 132/73 Body Mass Index(BMI): 65 Temperature(F): 98.3 Respiratory Rate 20 (breaths/min): Photos: [1:No Photos] [2:No Photos] [N/A:N/A] Wound Location: [1:Right Lower Leg - Lateral, Proximal] [2:Right Lower Leg - Lateral, Distal] [N/A:N/A] Wounding Event: [1:Gradually Appeared] [2:Gradually Appeared] [N/A:N/A] Primary Etiology: [1:Lymphedema] [2:Lymphedema] [N/A:N/A] Comorbid History: [1:Lymphedema, Hypertension] [2:Lymphedema, Hypertension] [N/A:N/A] Date Acquired: [1:08/08/2017] [2:08/08/2017] [N/A:N/A] Weeks of Treatment: [1:7] [2:7] [N/A:N/A] Wound Status: [1:Open] [2:Open] [N/A:N/A] Measurements  L x W x D [1:0.5x0.6x0.1] [2:4x6.5x0.3] [N/A:N/A] (cm) Area (cm) : [1:0.236] [2:20.42] [N/A:N/A] Volume (cm) : [1:0.024] [2:6.126] [N/A:N/A] % Reduction in Area: [1:84.40%] [2:4.80%] [N/A:N/A] % Reduction in Volume: [1:92.10%] [2:4.80%] [N/A:N/A] Classification: [1:Full Thickness With Exposed Support Structures] [2:Full Thickness With Exposed Support Structures] [N/A:N/A] Exudate Amount: [1:Large] [2:Large] [N/A:N/A] Exudate Type: [1:Serosanguineous] [2:Serosanguineous] [N/A:N/A] Exudate Color: [1:red, brown] [2:red, brown] [N/A:N/A] Foul Odor After Cleansing: [1:Yes] [2:Yes] [N/A:N/A] Odor Anticipated Due to [1:No] [2:No] [N/A:N/A] Product Use: Wound Margin: [1:Flat and Intact] [2:Flat and Intact] [N/A:N/A] Granulation Amount: [1:Large (67-100%)] [2:Medium (34-66%)] [N/A:N/A] Granulation Quality: [1:Red] [2:Red] [N/A:N/A] Necrotic Amount: [1:Small (1-33%)] [2:Medium (34-66%)] [N/A:N/A] Exposed Structures: [1:Fat Layer (Subcutaneous Tissue) Exposed:  Yes Fascia: No Tendon: No Muscle: No Joint: No Bone: No] [2:Fat Layer (Subcutaneous Tissue) Exposed: Yes Fascia: No Tendon: No Muscle: No Joint: No Bone: No] [N/A:N/A] Epithelialization: [1:None] [2:None] [N/A:N/A] Periwound Skin Texture: [1:Excoriation: No Induration: No] [2:Excoriation: No Induration: No] [N/A:N/A] Callus: No Callus: No Crepitus: No Crepitus: No Rash: No Rash: No Scarring: No Scarring: No Periwound Skin Moisture: Maceration: No Maceration: Yes N/A Dry/Scaly: No Dry/Scaly: No Periwound Skin Color: Atrophie Blanche: No Atrophie Blanche: No N/A Cyanosis: No Cyanosis: No Ecchymosis: No Ecchymosis: No Erythema: No Erythema: No Hemosiderin Staining: No Hemosiderin Staining: No Mottled: No Mottled: No Pallor: No Pallor: No Rubor: No Rubor: No Temperature: No Abnormality No Abnormality N/A Tenderness on Palpation: Yes Yes N/A Wound Preparation: Ulcer Cleansing: Ulcer Cleansing: N/A Rinsed/Irrigated with Saline, Rinsed/Irrigated with Saline, Other: soap and water Other: soap and water Topical Anesthetic Applied: Topical Anesthetic Applied: Other: lidocaine 4% Other: lidocaine 4% Treatment Notes Electronic Signature(s) Signed: 11/01/2017 3:07:42 PM By: Evlyn KannerBritto, Errol MD, FACS Entered By: Evlyn KannerBritto, Errol on 11/01/2017 15:07:42 [name], [name] ([id number]) -------------------------------------------------------------------------------- Multi-Disciplinary Care Plan Details Patient Name: Jessica CaseyLEATH, Liviah R. Date of Service: 11/01/2017 2:15 PM Medical Record Number: [id number] Patient Account Number: [id number] Date of Birth/Sex: 11-01-1981 (36 y.o. Female) Treating RN: Ashok CordiaPinkerton, Debi Primary Care Wiletta Bermingham: PATIENT, NO Other Clinician: Referring Ambri Miltner: Referral, Self Treating Trigg Delarocha/Extender: Rudene ReBritto, Errol Weeks in Treatment: 7 Active Inactive ` Orientation to the Wound Care Program Nursing Diagnoses: Knowledge deficit related to the wound healing  center program Goals: Patient/caregiver will verbalize understanding of the Wound Healing Center Program Date Initiated: 09/10/2017 Target Resolution Date: 11/23/2017 Goal Status: Active Interventions: Provide education on orientation to the wound center Notes: ` Venous Leg Ulcer Nursing Diagnoses: Knowledge deficit related to disease process and management Goals: Patient/caregiver will verbalize understanding of disease process and disease management Date Initiated: 09/10/2017 Target Resolution Date: 11/23/2017 Goal Status: Active Interventions: Assess peripheral edema status every visit. Notes: ` Wound/Skin Impairment Nursing Diagnoses: Impaired tissue integrity Goals: Ulcer/skin breakdown will heal within 14 weeks Date Initiated: 09/10/2017 Target Resolution Date: 11/24/2017 Goal Status: Active Interventions: [name], [name] ([id number]) Assess patient/caregiver ability to obtain necessary supplies Assess patient/caregiver ability to perform ulcer/skin care regimen upon admission and as needed Assess ulceration(s) every visit Notes: Electronic Signature(s) Signed: 11/01/2017 4:48:36 PM By: Alejandro MullingPinkerton, Debra Entered By: Alejandro MullingPinkerton, Debra on 11/01/2017 14:58:56 [name], [name] ([id number]) -------------------------------------------------------------------------------- Pain Assessment Details Patient Name: Jessica CaseyLEATH, Aarika R. Date of Service: 11/01/2017 2:15 PM Medical Record Number: [id number] Patient Account Number: [id number] Date of Birth/Sex: 11-01-1981 (36 y.o. Female) Treating RN: Ashok CordiaPinkerton, Debi Primary Care Doreen Garretson: PATIENT, NO Other Clinician: Referring Wayne Brunker: Referral, Self Treating Anderson Middlebrooks/Extender: Rudene ReBritto, Errol Weeks in Treatment: 7 Active Problems Location of Pain Severity and Description of Pain Patient Has Paino No Site Locations Pain Management and Medication Current Pain Management:  Electronic Signature(s) Signed: 11/01/2017 4:48:36 PM By: Alejandro MullingPinkerton,  Debra Entered By: Alejandro MullingPinkerton, Debra on 11/01/2017 14:31:05 [name], [name] ([id number]) -------------------------------------------------------------------------------- Patient/Caregiver Education Details Patient Name: Jessica CaseyLEATH, Meiling R. Date of Service: 11/01/2017 2:15 PM Medical Record Number: [id number] Patient Account Number: [id number] Date of Birth/Gender: 1981/01/30 (36 y.o. Female) Treating RN: Ashok CordiaPinkerton, Debi Primary Care Physician: PATIENT, NO Other Clinician: Referring Physician: Referral, Self Treating Physician/Extender: Rudene ReBritto, Errol Weeks in Treatment: 7 Education Assessment Education Provided To: Patient Education Topics Provided Wound/Skin Impairment: Handouts: Other: change dressing as ordered Methods: Demonstration, Explain/Verbal Responses: State content correctly Electronic Signature(s) Signed: 11/01/2017 4:48:36 PM By: Alejandro MullingPinkerton, Debra Entered By: Alejandro MullingPinkerton, Debra on 11/01/2017 15:00:11 [name], [name] ([id number]) -------------------------------------------------------------------------------- Wound Assessment Details Patient Name: Jessica CaseyLEATH, [name] Date of Service: 11/01/2017 2:15 PM Medical Record Number: [id number] Patient Account Number: [id number] Date of Birth/Sex: 1981/01/30 (36 y.o. Female) Treating RN: Ashok CordiaPinkerton, Debi Primary Care Shelaine Frie: PATIENT, NO Other Clinician: Referring Waino Mounsey: Referral, Self Treating Panfilo Ketchum/Extender: Rudene ReBritto, Errol Weeks in Treatment: 7 Wound Status Wound Number: 1 Primary Etiology: Lymphedema Wound Location: Right Lower Leg - Lateral, Proximal Wound Status: Open Wounding Event: Gradually Appeared Comorbid History: Lymphedema, Hypertension Date Acquired: 08/08/2017 Weeks Of Treatment: 7 Clustered Wound: No Photos Photo Uploaded By: Alejandro MullingPinkerton, Debra on 11/01/2017 16:54:18 Wound Measurements Length: (cm) 0.5 Width: (cm) 0.6 Depth: (cm) 0.1 Area: (cm) 0.236 Volume: (cm) 0.024 % Reduction in Area: 84.4% %  Reduction in Volume: 92.1% Epithelialization: None Tunneling: No Undermining: No Wound Description Full Thickness With Exposed Support Classification: Structures Wound Margin: Flat and Intact Exudate Large Amount: Exudate Type: Serosanguineous Exudate Color: red, brown Foul Odor After Cleansing: Yes Due to Product Use: No Slough/Fibrino Yes Wound Bed Granulation Amount: Large (67-100%) Exposed Structure Granulation Quality: Red Fascia Exposed: No Necrotic Amount: Small (1-33%) Fat Layer (Subcutaneous Tissue) Exposed: Yes Necrotic Quality: Adherent Slough Tendon Exposed: No Muscle Exposed: No Joint Exposed: No Bone Exposed: No [name], [name] ([id number]) Periwound Skin Texture Texture Color No Abnormalities Noted: No No Abnormalities Noted: No Callus: No Atrophie Blanche: No Crepitus: No Cyanosis: No Excoriation: No Ecchymosis: No Induration: No Erythema: No Rash: No Hemosiderin Staining: No Scarring: No Mottled: No Pallor: No Moisture Rubor: No No Abnormalities Noted: No Dry / Scaly: No Temperature / Pain Maceration: No Temperature: No Abnormality Tenderness on Palpation: Yes Wound Preparation Ulcer Cleansing: Rinsed/Irrigated with Saline, Other: soap and water, Topical Anesthetic Applied: Other: lidocaine 4%, Electronic Signature(s) Signed: 11/01/2017 4:48:36 PM By: Alejandro MullingPinkerton, Debra Entered By: Alejandro MullingPinkerton, Debra on 11/01/2017 14:42:44 [name], Alphonsine R. ([id number]) -------------------------------------------------------------------------------- Wound Assessment Details Patient Name: Jessica CaseyLEATH, Leticia R. Date of Service: 11/01/2017 2:15 PM Medical Record Number: [id number] Patient Account Number: [id number] Date of Birth/Sex: 1981/01/30 (36 y.o. Female) Treating RN: Ashok CordiaPinkerton, Debi Primary Care Zyanya Glaza: PATIENT, NO Other Clinician: Referring Haddy Mullinax: Referral, Self Treating Yolonda Purtle/Extender: Rudene ReBritto, Errol Weeks in Treatment: 7 Wound Status Wound  Number: 2 Primary Etiology: Lymphedema Wound Location: Right Lower Leg - Lateral, Distal Wound Status: Open Wounding Event: Gradually Appeared Comorbid History: Lymphedema, Hypertension Date Acquired: 08/08/2017 Weeks Of Treatment: 7 Clustered Wound: No Photos Photo Uploaded By: Alejandro MullingPinkerton, Debra on 11/01/2017 16:54:41 Wound Measurements Length: (cm) 4 Width: (cm) 6.5 Depth: (cm) 0.3 Area: (cm) 20.42 Volume: (cm) 6.126 % Reduction in Area: 4.8% % Reduction in Volume: 4.8% Epithelialization: None Tunneling: No Undermining: No Wound Description Full Thickness With Exposed Support Classification: Structures Wound Margin: Flat and Intact Exudate Large Amount: Exudate Type: Serosanguineous Exudate Color: red, brown Foul Odor After Cleansing: Yes Due to Product Use: No Slough/Fibrino  Yes Wound Bed Granulation Amount: Medium (34-66%) Exposed Structure Granulation Quality: Red Fascia Exposed: No Necrotic Amount: Medium (34-66%) Fat Layer (Subcutaneous Tissue) Exposed: Yes Necrotic Quality: Adherent Slough Tendon Exposed: No Muscle Exposed: No Joint Exposed: No Bone Exposed: No Nyman, Yajayra R. ([id number]) Periwound Skin Texture Texture Color No Abnormalities Noted: No No Abnormalities Noted: No Callus: No Atrophie Blanche: No Crepitus: No Cyanosis: No Excoriation: No Ecchymosis: No Induration: No Erythema: No Rash: No Hemosiderin Staining: No Scarring: No Mottled: No Pallor: No Moisture Rubor: No No Abnormalities Noted: No Dry / Scaly: No Temperature / Pain Maceration: Yes Temperature: No Abnormality Tenderness on Palpation: Yes Wound Preparation Ulcer Cleansing: Rinsed/Irrigated with Saline, Other: soap and water, Topical Anesthetic Applied: Other: lidocaine 4%, Electronic Signature(s) Signed: 11/01/2017 4:48:36 PM By: [name] Entered By: [name] on 11/01/2017 14:43:27 Raden, Keeley R.  ([id number]) -------------------------------------------------------------------------------- Vitals Details Patient Name: Jessica Casey R. Date of Service: 11/01/2017 2:15 PM Medical Record Number: [id number] Patient Account Number: [id number] Date of Birth/Sex: May 22, 1981 (36 y.o. Female) Treating RN: Ashok Cordia, Debi Primary Care Geron Mulford: PATIENT, NO Other Clinician: Referring Sentoria Brent: Referral, Self Treating Maida Widger/Extender: Rudene Re in Treatment: 7 Vital Signs Time Taken: 14:31 Temperature (F): 98.3 Height (in): 74 Pulse (bpm): 91 Weight (lbs): 505 Respiratory Rate (breaths/min): 20 Body Mass Index (BMI): 64.8 Blood Pressure (mmHg): 132/73 Reference Range: 80 - 120 mg / dl Electronic Signature(s) Signed: 11/01/2017 4:48:36 PM By: [name] Entered By: [name] on 11/01/2017 14:33:58

## 2017-11-08 ENCOUNTER — Encounter: Payer: Self-pay | Admitting: Nurse Practitioner

## 2017-11-10 NOTE — Progress Notes (Signed)
[name] ([id number]) Visit Report for 11/08/2017 Arrival Information Details Patient Name: [name], [name] Date of Service: 11/08/2017 8:45 AM Medical Record Number: [id number] Patient Account Number: [id number] Date of Birth/Sex: [birth year] (36 y.o. Female) Treating RN: Ashok Cordia, Debi Primary Care Joua Bake: PATIENT, NO Other Clinician: Referring Ellinore Merced: Referral, Self Treating Cane Dubray/Extender: Kathreen Cosier in Treatment: 8 Visit Information History Since Last Visit All ordered tests and consults were completed: No Patient Arrived: Ambulatory Added or deleted any medications: No Arrival Time: 09:06 Any new allergies or adverse reactions: No Accompanied By: self Had a fall or experienced change in No Transfer Assistance: None activities of daily living that may affect Patient Identification Verified: Yes risk of falls: Secondary Verification Process Completed: Yes Signs or symptoms of abuse/neglect since last visito No Patient Requires Transmission-Based No Hospitalized since last visit: No Precautions: Has Dressing in Place as Prescribed: Yes Patient Has Alerts: No Has Compression in Place as Prescribed: Yes Pain Present Now: No Electronic Signature(s) Signed: 11/08/2017 4:26:05 PM By: [name] Entered By: [name] on 11/08/2017 09:10:11 Newhouse, Cristal Deer ([id number]) -------------------------------------------------------------------------------- Encounter Discharge Information Details Patient Name: Jessica Casey R. Date of Service: 11/08/2017 8:45 AM Medical Record Number: [id number] Patient Account Number: [id number] Date of Birth/Sex: 1981/09/23 (36 y.o. Female) Treating RN: Ashok Cordia, Debi Primary Care Eisley Barber: PATIENT, NO Other Clinician: Referring Shevelle Smither: Referral, Self Treating Romualdo Prosise/Extender: Kathreen Cosier in Treatment: 8 Encounter Discharge Information Items Discharge Pain Level: 0 Discharge Condition:  Stable Ambulatory Status: Ambulatory Discharge Destination: Home Transportation: Private Auto Accompanied By: self Schedule Follow-up Appointment: Yes Medication Reconciliation completed and No provided to Patient/Care Trevin Gartrell: Provided on Clinical Summary of Care: 11/08/2017 Form Type Recipient Paper Patient EL Electronic Signature(s) Signed: 11/08/2017 9:59:06 AM By: [name] Entered By: [name] on 11/08/2017 09:59:05 Heffernan, Tanee R. ([id number]) -------------------------------------------------------------------------------- Lower Extremity Assessment Details Patient Name: Jessica Casey R. Date of Service: 11/08/2017 8:45 AM Medical Record Number: [id number] Patient Account Number: [id number] Date of Birth/Sex: 07/28/1981 (36 y.o. Female) Treating RN: Ashok Cordia, Debi Primary Care Zyeir Dymek: PATIENT, NO Other Clinician: Referring Kathey Simer: Referral, Self Treating Kash Mothershead/Extender: Kathreen Cosier in Treatment: 8 Edema Assessment Assessed: [Left: No] [Right: No] [Left: Edema] [Right: :] Calf Left: Right: Point of Measurement: 35 cm From Medial Instep cm 56.8 cm Ankle Left: Right: Point of Measurement: 9 cm From Medial Instep cm 37.2 cm Vascular Assessment Pulses: Dorsalis Pedis Palpable: [Right:Yes] Posterior Tibial Extremity colors, hair growth, and conditions: Extremity Color: [Right:Hyperpigmented] Temperature of Extremity: [Right:Warm] Capillary Refill: [Right:< 3 seconds] Toe Nail Assessment Left: Right: Thick: Yes Discolored: Yes Deformed: Yes Improper Length and Hygiene: Yes Electronic Signature(s) Signed: 11/08/2017 4:26:05 PM By: [name] Entered By: [name] on 11/08/2017 09:21:18 Chipley, Malaka R. ([id number]) -------------------------------------------------------------------------------- Multi Wound Chart Details Patient Name: Jessica Casey R. Date of Service: 11/08/2017 8:45 AM Medical Record Number:  [id number] Patient Account Number: [id number] Date of Birth/Sex: [birth year] (36 y.o. Female) Treating RN: Ashok Cordia, Debi Primary Care Arnetha Silverthorne: PATIENT, NO Other Clinician: Referring Tresa Jolley: Referral, Self Treating Rondell Pardon/Extender: Kathreen Cosier in Treatment: 8 Vital Signs Height(in): 74 Pulse(bpm): 90 Weight(lbs): 505 Blood Pressure(mmHg): 130/85 Body Mass Index(BMI): 65 Temperature(F): 97.7 Respiratory Rate 20 (breaths/min): Photos: [1:No Photos] [2:No Photos] [N/A:N/A] Wound Location: [1:Right Lower Leg - Lateral, Proximal] [2:Right Lower Leg - Lateral, Distal] [N/A:N/A] Wounding Event: [1:Gradually Appeared] [2:Gradually Appeared] [N/A:N/A] Primary Etiology: [1:Lymphedema] [2:Lymphedema] [N/A:N/A] Comorbid History: [1:Lymphedema, Hypertension] [2:Lymphedema, Hypertension] [N/A:N/A] Date Acquired: [1:08/08/2017] [2:08/08/2017] [N/A:N/A] Weeks of Treatment: [1:8] [2:8] [N/A:N/A] Wound Status: [1:Open] [2:Open] [N/A:N/A] Measurements  L x W x D [1:0.3x0.3x0.1] [2:3.6x6.1x0.3] [N/A:N/A] (cm) Area (cm) : [1:0.071] [2:17.247] [N/A:N/A] Volume (cm) : [1:0.007] [2:5.174] [N/A:N/A] % Reduction in Area: [1:95.30%] [2:19.60%] [N/A:N/A] % Reduction in Volume: [1:97.70%] [2:19.60%] [N/A:N/A] Classification: [1:Full Thickness With Exposed Support Structures] [2:Full Thickness With Exposed Support Structures] [N/A:N/A] Exudate Amount: [1:Large] [2:Large] [N/A:N/A] Exudate Type: [1:Serosanguineous] [2:Serosanguineous] [N/A:N/A] Exudate Color: [1:red, brown] [2:red, brown] [N/A:N/A] Foul Odor After Cleansing: [1:Yes] [2:Yes] [N/A:N/A] Odor Anticipated Due to [1:No] [2:No] [N/A:N/A] Product Use: Wound Margin: [1:Flat and Intact] [2:Flat and Intact] [N/A:N/A] Granulation Amount: [1:Large (67-100%)] [2:Medium (34-66%)] [N/A:N/A] Granulation Quality: [1:Red] [2:Red] [N/A:N/A] Necrotic Amount: [1:Small (1-33%)] [2:Medium (34-66%)] [N/A:N/A] Exposed Structures: [1:Fat Layer  (Subcutaneous Tissue) Exposed: Yes Fascia: No Tendon: No Muscle: No Joint: No Bone: No] [2:Fat Layer (Subcutaneous Tissue) Exposed: Yes Fascia: No Tendon: No Muscle: No Joint: No Bone: No] [N/A:N/A] Epithelialization: [1:None] [2:None] [N/A:N/A] Debridement: [1:N/A N/A] [2:Debridement ([id number]) 09:26] [N/A:N/A N/A] Pre-procedure Verification/Time Out Taken: Pain Control: N/A Lidocaine 4% Topical Solution N/A Tissue Debrided: N/A Fibrin/Slough, Exudates, N/A Subcutaneous Level: N/A Skin/Subcutaneous Tissue N/A Debridement Area (sq cm): N/A 21.96 N/A Instrument: N/A Curette N/A Bleeding: N/A Minimum N/A Hemostasis Achieved: N/A Pressure N/A Procedural Pain: N/A 0 N/A Post Procedural Pain: N/A 0 N/A Debridement Treatment N/A Procedure was tolerated well N/A Response: Post Debridement N/A 3.6x6.1x0.4 N/A Measurements L x W x D (cm) Post Debridement Volume: N/A 6.899 N/A (cm) Periwound Skin Texture: Excoriation: No Excoriation: No N/A Induration: No Induration: No Callus: No Callus: No Crepitus: No Crepitus: No Rash: No Rash: No Scarring: No Scarring: No Periwound Skin Moisture: Maceration: No Maceration: Yes N/A Dry/Scaly: No Dry/Scaly: No Periwound Skin Color: Atrophie Blanche: No Atrophie Blanche: No N/A Cyanosis: No Cyanosis: No Ecchymosis: No Ecchymosis: No Erythema: No Erythema: No Hemosiderin Staining: No Hemosiderin Staining: No Mottled: No Mottled: No Pallor: No Pallor: No Rubor: No Rubor: No Temperature: No Abnormality No Abnormality N/A Tenderness on Palpation: Yes Yes N/A Wound Preparation: Ulcer Cleansing: Ulcer Cleansing: N/A Rinsed/Irrigated with Saline, Rinsed/Irrigated with Saline, Other: soap and water Other: soap and water Topical Anesthetic Applied: Topical Anesthetic Applied: Other: lidocaine 4% Other: lidocaine 4% Procedures Performed: N/A Debridement N/A Treatment Notes Electronic Signature(s) Signed: 11/08/2017 4:55:16 PM  By: Bonnell Public Entered By: Bonnell Public on 11/08/2017 09:36:55 Flanery, Loletta R. ([id number]) -------------------------------------------------------------------------------- Multi-Disciplinary Care Plan Details Patient Name: Jessica Casey R. Date of Service: 11/08/2017 8:45 AM Medical Record Number: [id number] Patient Account Number: [id number] Date of Birth/Sex: 1981/02/24 (36 y.o. Female) Treating RN: Ashok Cordia, Debi Primary Care Gustave Lindeman: PATIENT, NO Other Clinician: Referring Joaquin Knebel: Referral, Self Treating Irwin Toran/Extender: Kathreen Cosier in Treatment: 8 Active Inactive ` Orientation to the Wound Care Program Nursing Diagnoses: Knowledge deficit related to the wound healing center program Goals: Patient/caregiver will verbalize understanding of the Wound Healing Center Program Date Initiated: 09/10/2017 Target Resolution Date: 11/23/2017 Goal Status: Active Interventions: Provide education on orientation to the wound center Notes: ` Venous Leg Ulcer Nursing Diagnoses: Knowledge deficit related to disease process and management Goals: Patient/caregiver will verbalize understanding of disease process and disease management Date Initiated: 09/10/2017 Target Resolution Date: 11/23/2017 Goal Status: Active Interventions: Assess peripheral edema status every visit. Notes: ` Wound/Skin Impairment Nursing Diagnoses: Impaired tissue integrity Goals: Ulcer/skin breakdown will heal within 14 weeks Date Initiated: 09/10/2017 Target Resolution Date: 11/24/2017 Goal Status: Active Interventions: TANAIYA, KOLARIK ([id number]) Assess patient/caregiver ability to obtain necessary supplies Assess patient/caregiver ability to perform ulcer/skin care regimen upon admission and as needed Assess ulceration(s) every visit Notes: Electronic Signature(s)  Signed: 11/08/2017 4:26:05 PM By: Alejandro MullingPinkerton, Debra Previous Signature: 11/08/2017 9:26:01 AM Version By: Alejandro MullingPinkerton,  Debra Entered By: Alejandro MullingPinkerton, Debra on 11/08/2017 09:26:33 Hockman, Cristal DeerERICA R. ([id number]) -------------------------------------------------------------------------------- Pain Assessment Details Patient Name: Jessica CaseyLEATH, Jeliyah R. Date of Service: 11/08/2017 8:45 AM Medical Record Number: [id number] Patient Account Number: [id number] Date of Birth/Sex: [birth year] (36 y.o. Female) Treating RN: Ashok CordiaPinkerton, Debi Primary Care Sherron Mummert: PATIENT, NO Other Clinician: Referring Yona Kosek: Referral, Self Treating Krystin Keeven/Extender: Kathreen Cosieroulter, Leah Weeks in Treatment: 8 Active Problems Location of Pain Severity and Description of Pain Patient Has Paino No Site Locations Pain Management and Medication Current Pain Management: Electronic Signature(s) Signed: 11/08/2017 4:26:05 PM By: Alejandro MullingPinkerton, Debra Entered By: Alejandro MullingPinkerton, Debra on 11/08/2017 09:10:16 Mignogna, Cristal DeerERICA R. ([id number]) -------------------------------------------------------------------------------- Patient/Caregiver Education Details Patient Name: Jessica CaseyLEATH, Laurna R. Date of Service: 11/08/2017 8:45 AM Medical Record Number: [id number] Patient Account Number: [id number] Date of Birth/Gender: [birth year] (36 y.o. Female) Treating RN: Ashok CordiaPinkerton, Debi Primary Care Physician: PATIENT, NO Other Clinician: Referring Physician: Referral, Self Treating Physician/Extender: Kathreen Cosieroulter, Leah Weeks in Treatment: 8 Education Assessment Education Provided To: Patient Education Topics Provided Wound/Skin Impairment: Handouts: Caring for Your Ulcer, Other: change dressing as ordered Methods: Demonstration, Explain/Verbal Responses: State content correctly Electronic Signature(s) Signed: 11/08/2017 4:26:05 PM By: Alejandro MullingPinkerton, Debra Entered By: Alejandro MullingPinkerton, Debra on 11/08/2017 09:59:21 Bruyere, Taleisha R. ([id number]) -------------------------------------------------------------------------------- Wound Assessment Details Patient Name: Jessica CaseyLEATH, Amere R. Date of Service:  11/08/2017 8:45 AM Medical Record Number: [id number] Patient Account Number: [id number] Date of Birth/Sex: [birth year] (36 y.o. Female) Treating RN: Ashok CordiaPinkerton, Debi Primary Care Vahe Pienta: PATIENT, NO Other Clinician: Referring Rudine Rieger: Referral, Self Treating Eternity Dexter/Extender: Kathreen Cosieroulter, Leah Weeks in Treatment: 8 Wound Status Wound Number: 1 Primary Etiology: Lymphedema Wound Location: Right Lower Leg - Lateral, Proximal Wound Status: Open Wounding Event: Gradually Appeared Comorbid History: Lymphedema, Hypertension Date Acquired: 08/08/2017 Weeks Of Treatment: 8 Clustered Wound: No Photos Photo Uploaded By: Alejandro MullingPinkerton, Debra on 11/08/2017 16:22:04 Wound Measurements Length: (cm) 0.3 Width: (cm) 0.3 Depth: (cm) 0.1 Area: (cm) 0.071 Volume: (cm) 0.007 % Reduction in Area: 95.3% % Reduction in Volume: 97.7% Epithelialization: None Tunneling: No Undermining: No Wound Description Full Thickness With Exposed Support Classification: Structures Wound Margin: Flat and Intact Exudate Large Amount: Exudate Type: Serosanguineous Exudate Color: red, brown Foul Odor After Cleansing: Yes Due to Product Use: No Slough/Fibrino Yes Wound Bed Granulation Amount: Large (67-100%) Exposed Structure Granulation Quality: Red Fascia Exposed: No Necrotic Amount: Small (1-33%) Fat Layer (Subcutaneous Tissue) Exposed: Yes Necrotic Quality: Adherent Slough Tendon Exposed: No Muscle Exposed: No Joint Exposed: No Bone Exposed: No Friesz, Tracy R. ([id number]) Periwound Skin Texture Texture Color No Abnormalities Noted: No No Abnormalities Noted: No Callus: No Atrophie Blanche: No Crepitus: No Cyanosis: No Excoriation: No Ecchymosis: No Induration: No Erythema: No Rash: No Hemosiderin Staining: No Scarring: No Mottled: No Pallor: No Moisture Rubor: No No Abnormalities Noted: No Dry / Scaly: No Temperature / Pain Maceration: No Temperature: No Abnormality Tenderness on  Palpation: Yes Wound Preparation Ulcer Cleansing: Rinsed/Irrigated with Saline, Other: soap and water, Topical Anesthetic Applied: Other: lidocaine 4%, Treatment Notes Wound #1 (Right, Proximal, Lateral Lower Leg) 1. Cleansed with: Clean wound with Normal Saline Cleanse wound with antibacterial soap and water 2. Anesthetic Topical Lidocaine 4% cream to wound bed prior to debridement 3. Peri-wound Care: Barrier cream Moisturizing lotion 4. Dressing Applied: Prisma Ag 5. Secondary Dressing Applied ABD Pad Dry Gauze 7. Secured with Tape 4-Layer Compression System - Right Lower Extremity Notes xtrasorb, charcoal, unna to anchor Electronic Signature(s) Signed: 11/08/2017 4:26:05 PM By:  Alejandro MullingPinkerton, Debra Entered By: Alejandro MullingPinkerton, Debra on 11/08/2017 09:20:10 Marlaine HindLEATH, Aretha R. ([id number]) -------------------------------------------------------------------------------- Wound Assessment Details Patient Name: Jessica CaseyLEATH, Ilyanna R. Date of Service: 11/08/2017 8:45 AM Medical Record Number: [id number] Patient Account Number: [id number] Date of Birth/Sex: 10-14-1981 (36 y.o. Female) Treating RN: Ashok CordiaPinkerton, Debi Primary Care Roldan Laforest: PATIENT, NO Other Clinician: Referring Cloteal Isaacson: Referral, Self Treating Kunio Cummiskey/Extender: Kathreen Cosieroulter, Leah Weeks in Treatment: 8 Wound Status Wound Number: 2 Primary Etiology: Lymphedema Wound Location: Right Lower Leg - Lateral, Distal Wound Status: Open Wounding Event: Gradually Appeared Comorbid History: Lymphedema, Hypertension Date Acquired: 08/08/2017 Weeks Of Treatment: 8 Clustered Wound: No Photos Photo Uploaded By: Alejandro MullingPinkerton, Debra on 11/08/2017 16:22:22 Wound Measurements Length: (cm) 3.6 Width: (cm) 6.1 Depth: (cm) 0.3 Area: (cm) 17.247 Volume: (cm) 5.174 % Reduction in Area: 19.6% % Reduction in Volume: 19.6% Epithelialization: None Tunneling: No Undermining: No Wound Description Full Thickness With Exposed  Support Classification: Structures Wound Margin: Flat and Intact Exudate Large Amount: Exudate Type: Serosanguineous Exudate Color: red, brown Foul Odor After Cleansing: Yes Due to Product Use: No Slough/Fibrino Yes Wound Bed Granulation Amount: Medium (34-66%) Exposed Structure Granulation Quality: Red Fascia Exposed: No Necrotic Amount: Medium (34-66%) Fat Layer (Subcutaneous Tissue) Exposed: Yes Necrotic Quality: Adherent Slough Tendon Exposed: No Muscle Exposed: No Joint Exposed: No Bone Exposed: No Minehart, Miki R. ([id number]) Periwound Skin Texture Texture Color No Abnormalities Noted: No No Abnormalities Noted: No Callus: No Atrophie Blanche: No Crepitus: No Cyanosis: No Excoriation: No Ecchymosis: No Induration: No Erythema: No Rash: No Hemosiderin Staining: No Scarring: No Mottled: No Pallor: No Moisture Rubor: No No Abnormalities Noted: No Dry / Scaly: No Temperature / Pain Maceration: Yes Temperature: No Abnormality Tenderness on Palpation: Yes Wound Preparation Ulcer Cleansing: Rinsed/Irrigated with Saline, Other: soap and water, Topical Anesthetic Applied: Other: lidocaine 4%, Treatment Notes Wound #2 (Right, Distal, Lateral Lower Leg) 1. Cleansed with: Clean wound with Normal Saline Cleanse wound with antibacterial soap and water 2. Anesthetic Topical Lidocaine 4% cream to wound bed prior to debridement 3. Peri-wound Care: Barrier cream Moisturizing lotion 4. Dressing Applied: Prisma Ag 5. Secondary Dressing Applied ABD Pad Dry Gauze 7. Secured with Tape 4-Layer Compression System - Right Lower Extremity Notes xtrasorb, charcoal, unna to anchor Electronic Signature(s) Signed: 11/08/2017 4:26:05 PM By: Alejandro MullingPinkerton, Debra Entered By: Alejandro MullingPinkerton, Debra on 11/08/2017 09:19:58 Pinkus, Kelda R. ([id number]) -------------------------------------------------------------------------------- Vitals Details Patient Name: Jessica CaseyLEATH, Zuleica R. Date  of Service: 11/08/2017 8:45 AM Medical Record Number: [id number] Patient Account Number: [id number] Date of Birth/Sex: 10-14-1981 (36 y.o. Female) Treating RN: Ashok CordiaPinkerton, Debi Primary Care Lissandro Dilorenzo: PATIENT, NO Other Clinician: Referring Collen Hostler: Referral, Self Treating Nikyah Lackman/Extender: Kathreen Cosieroulter, Leah Weeks in Treatment: 8 Vital Signs Time Taken: 09:10 Temperature (F): 97.7 Height (in): 74 Pulse (bpm): 90 Weight (lbs): 505 Respiratory Rate (breaths/min): 20 Body Mass Index (BMI): 64.8 Blood Pressure (mmHg): 130/85 Reference Range: 80 - 120 mg / dl Electronic Signature(s) Signed: 11/08/2017 4:26:05 PM By: Alejandro MullingPinkerton, Debra Entered By: Alejandro MullingPinkerton, Debra on 11/08/2017 09:12:29

## 2017-11-10 NOTE — Progress Notes (Signed)
Jessica, [name] ([id number]) Visit Report for 11/08/2017 Chief Complaint Document Details Patient Name: [name] Date of Service: 11/08/2017 8:45 AM Medical Record Number: [id number] Patient Account Number: [id number] Date of Birth/Sex: July 21, 1981 (36 y.o. Female) Treating RN: Ashok Cordia, Debi Primary Care Provider: PATIENT, NO Other Clinician: Referring Provider: Referral, Self Treating Provider/Extender: Kathreen Cosier in Treatment: 8 Information Obtained from: Patient Chief Complaint She is here in follow up evaluation of right lower extremity ulcers Electronic Signature(s) Signed: 11/08/2017 4:55:16 PM By: Jessica Public Entered By: Jessica Public on 11/08/2017 09:37:50 Lata, Tesa R. ([id number]) -------------------------------------------------------------------------------- Debridement Details Patient Name: Jessica Casey R. Date of Service: 11/08/2017 8:45 AM Medical Record Number: [id number] Patient Account Number: [id number] Date of Birth/Sex: 1981/06/17 (36 y.o. Female) Treating RN: Ashok Cordia, Debi Primary Care Provider: PATIENT, NO Other Clinician: Referring Provider: Referral, Self Treating Provider/Extender: Kathreen Cosier in Treatment: 8 Debridement Performed for Wound #2 Right,Distal,Lateral Lower Leg Assessment: Performed By: Physician Jessica Public, NP Debridement: Debridement Pre-procedure Verification/Time Yes - 09:26 Out Taken: Start Time: 09:27 Pain Control: Lidocaine 4% Topical Solution Level: Skin/Subcutaneous Tissue Total Area Debrided (L x W): 3.6 (cm) x 6.1 (cm) = 21.96 (cm) Tissue and other material Viable, Non-Viable, Exudate, Fibrin/Slough, Subcutaneous debrided: Instrument: Curette Bleeding: Minimum Hemostasis Achieved: Pressure End Time: 09:30 Procedural Pain: 0 Post Procedural Pain: 0 Response to Treatment: Procedure was tolerated well Post Debridement Measurements of Total Wound Length: (cm) 3.6 Width: (cm) 6.1 Depth:  (cm) 0.4 Volume: (cm) 6.899 Character of Wound/Ulcer Post Debridement: Requires Further Debridement Post Procedure Diagnosis Same as Pre-procedure Electronic Signature(s) Signed: 11/08/2017 4:26:05 PM By: [name] Signed: 11/08/2017 4:55:16 PM By: Jessica Public Entered By: Jessica Public on 11/08/2017 09:37:20 [name], [name] ([id number]) -------------------------------------------------------------------------------- HPI Details Patient Name: Jessica Casey R. Date of Service: 11/08/2017 8:45 AM Medical Record Number: [id number] Patient Account Number: [id number] Date of Birth/Sex: January 13, 1981 (36 y.o. Female) Treating RN: Ashok Cordia, Debi Primary Care Provider: PATIENT, NO Other Clinician: Referring Provider: Referral, Self Treating Provider/Extender: Kathreen Cosier in Treatment: 8 History of Present Illness Location: right lower extremity lateral calf several large areas Quality: Patient reports experiencing a sharp pain to affected area(s). Severity: Patient states wound are getting worse. Duration: Patient has had the wound for < 5 weeks prior to presenting for treatment Timing: Pain in wound is Intermittent (comes and goes Context: The wound would happen gradually Modifying Factors: Other treatment(s) tried include:admission to the hospital for sepsis and a full workup with treatment with IV antibiotics and discharged on oral antibiotics Associated Signs and Symptoms: Patient reports having increase discharge. HPI Description: 36 year old patient well known to our [facility] wound care clinic where she has been seen since 2016 for bilateral lower extremity venous insufficiency disease with lymphedema and multiple ulcerations associated with morbid obesity. she had custom-made compression stockings and lymphedema pumps which were used in the past. most recently she was admitted to the hospital between October 11 and 09/02/2017 with sepsis, lower extremity wounds  and lymphedema.she was initially treated in the outpatient with Keflex and Bactrim. she was initially treated in the hospital with vancomycin and Zosyn and changed over to Unasyn until her white count improved and her blood cultures were negative for 3 days. After her inpatient management she was discharged home on Augmentin to end on 09/13/2017 with a 14 day course. she has had outpatient vascular duplex scans completed in November 2017 and her right ABI was 1.1 and the left ABI is 1.3. she had normal toe brachial indices bilaterally.she  had three-vessel runoff in the right lower extremity and two-vessel runoff in the left lower extremity. On questioning the patient she does have custom made compression stockings and also has a lymphedema pump but has not been using it appropriately and has not been taking good care of herself. 09/17/2017 -- she returns today with compression stockings on the left side and the right side has had significant amount of drainage and has a very strong odor 09/24/2017 -- the drainage is increased significantly and she has more lymphedema and a very strong odor to her wound. Though she does not have systemic symptoms, or overt infection I believe she will benefit from some doxycycline given empirically. 10/01/2017 -- after starting the doxycycline and changing the dressing twice a week her symptoms and signs have definitely improved overall. 10/08/2017 -- she has completed her course of doxycycline but continues to have a lot of drainage and needs twice a week dressing changes. 11/08/17-she is here in follow-up evaluation for right lower extremity ulcers. She admits to using her lymphedema pumps twice daily, one hour per session. she is voicing no complaints or concerns, no signs of infection will change to [employer] Electronic Signature(s) Signed: 11/08/2017 4:55:16 PM By: [name] Entered By: Jessica Publicoulter, Fany Cavanaugh on 11/08/2017 09:40:15 [name], [name]  ([id number]) -------------------------------------------------------------------------------- Physical Exam Details Patient Name: Jessica CaseyLEATH, Iyonna R. Date of Service: 11/08/2017 8:45 AM Medical Record Number: [id number] Patient Account Number: [id number] Date of Birth/Sex: November 11, 1981 (36 y.o. Female) Treating RN: Phillis HaggisPinkerton, Debi Primary Care Provider: PATIENT, NO Other Clinician: Referring Provider: Referral, Self Treating Provider/Extender: Kathreen Cosieroulter, Lacresha Fusilier Weeks in Treatment: 8 Electronic Signature(s) Signed: 11/08/2017 4:55:16 PM By: Jessica Publicoulter, Briane Birden Entered By: Jessica Publicoulter, Yonis Carreon on 11/08/2017 09:40:45 Vandervelden, Cristal DeerERICA R. ([id number]) -------------------------------------------------------------------------------- Physician Orders Details Patient Name: Jessica CaseyLEATH, Fate R. Date of Service: 11/08/2017 8:45 AM Medical Record Number: [id number] Patient Account Number: [id number] Date of Birth/Sex: November 11, 1981 (36 y.o. 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 o Cleanse wound with mild soap and water Wound #2 Right,Distal,Lateral Lower Leg o Clean wound with Normal Saline. o Cleanse wound with mild soap and water Anesthetic (add to Medication List) Wound #1 Right,Proximal,Lateral Lower Leg o Topical Lidocaine 4% cream applied to wound bed prior to debridement (In Clinic  Only). Wound #2 Right,Distal,Lateral Lower Leg o Topical Lidocaine 4% cream applied to wound bed prior to debridement (In Clinic Only). Skin Barriers/Peri-Wound Care Wound #1 Right,Proximal,Lateral Lower Leg o Barrier cream o Moisturizing lotion Wound #2 Right,Distal,Lateral Lower Leg o Barrier cream o Moisturizing lotion Primary Wound Dressing Wound #1 Right,Proximal,Lateral Lower Leg o Prisma Ag Wound #2 Right,Distal,Lateral Lower Leg o Prisma Ag Secondary Dressing Wound #1 Right,Proximal,Lateral Lower Leg o ABD pad Furr, Daleisa R. ([id number]) Wenda Lowo XtraSorb o Other - charcoal Wound #2 Right,Distal,Lateral Lower Leg o ABD pad o XtraSorb o Other - charcoal Dressing Change Frequency Wound #1 Right,Proximal,Lateral Lower Leg o Dressing is to be changed Monday and Thursday. Wound #2 Right,Distal,Lateral Lower Leg o Dressing is to be changed Monday and Thursday. Follow-up Appointments o  Return Appointment in 1 week. o Nurse Visit as needed Edema Control Wound #1 Right,Proximal,Lateral Lower Leg o 4-Layer Compression System - Right Lower Extremity - unna to anchor o Elevate legs to the level of the heart and pump ankles as often as possible Wound #2 Right,Distal,Lateral Lower Leg o 4-Layer Compression System - Right Lower Extremity - unna to anchor o Elevate legs to the level of the heart and pump ankles as often as possible Additional Orders / Instructions Wound #1 Right,Proximal,Lateral Lower Leg o Vitamin A; Vitamin C, Zinc o Increase protein intake. o Other: - Please watch your salt (sodium) intake Wound #2 Right,Distal,Lateral Lower Leg o Vitamin A; Vitamin C, Zinc o Increase protein intake. o Other: - Please watch your salt (sodium) intake Patient Medications Allergies: No Known Allergies Notifications Medication Indication Start End lidocaine DOSE 1 - topical 4 % cream - 1 cream topical Electronic  Signature(s) Signed: 11/08/2017 4:26:05 PM By: [name] Signed: 11/08/2017 4:55:16 PM By: Jessica Public Entered By: [name] on 11/08/2017 09:52:49 Frankl, Olena R. ([id number]) -------------------------------------------------------------------------------- Prescription 11/08/2017 Patient Name: Jessica Casey R. Provider: Bonnell Public NP Date of Birth: 20-Aug-1981 NPI#: [id number] Sex: F DEA#: [id number] Phone #: [phone] License #: Patient Address: [facility] [address] [facility] [place], [place] [address] [address] [place], [place] [address] [phone] Allergies No Known Allergies Medication Medication: Route: Strength: Form: lidocaine topical 4% cream Class: TOPICAL LOCAL ANESTHETICS Dose: Frequency / Time: Indication: 1 1 cream topical Number of Refills: Number of Units: 0 Generic Substitution: Start Date: End Date: Administered at Substitution Permitted Facility: No Note to Pharmacy: Signature(s): Date(s): Electronic Signature(s) Signed: 11/08/2017 4:26:05 PM By: [name] Signed: 11/08/2017 4:55:16 PM By: Jessica Public Entered By: [name] on 11/08/2017 09:52:51 Droege, Jereline R. ([id number]) --------------------------------------------------------------------------------  Problem List Details Patient Name: Jessica Casey R. Date of Service: 11/08/2017 8:45 AM Medical Record Number: [id number] Patient Account Number: [id number] Date of Birth/Sex: 05/12/1981 (36 y.o. Female) Treating RN: Ashok Cordia, Debi Primary Care Provider: PATIENT, NO Other Clinician: Referring Provider: Referral, Self Treating Provider/Extender: Kathreen Cosier in Treatment: 8 Active Problems ICD-10 Encounter Code Description Active Date Diagnosis L97.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d 09/10/2017 Yes I89.0 Lymphedema, not elsewhere classified  09/10/2017 Yes I87.311 Chronic venous hypertension (idiopathic) with ulcer of right lower 09/10/2017 Yes extremity E66.01 Morbid (severe) obesity due to excess calories 09/10/2017 Yes Inactive Problems Resolved Problems Electronic Signature(s) Signed: 11/08/2017 4:55:16 PM By: Jessica Public Entered By: Jessica Public on 11/08/2017 09:36:45 Mullarkey, Leverne R. ([id number]) -------------------------------------------------------------------------------- Progress Note Details Patient Name: Jessica Casey R. Date of Service: 11/08/2017 8:45 AM Medical Record Number: [id number] Patient Account Number: [id number] Date of Birth/Sex: 05/13/1981 (36 y.o. Female) Treating RN: Ashok Cordia, Debi Primary Care Provider: PATIENT, NO Other Clinician: Referring Provider: Referral, Self Treating Provider/Extender: Kathreen Cosier in Treatment: 8 Subjective Chief Complaint Information obtained from Patient She is here in follow up evaluation of right lower extremity ulcers History of Present Illness (HPI) The following HPI elements were documented for the patient's wound: Location: right lower extremity lateral calf several large areas Quality: Patient reports experiencing a sharp pain to affected area(s). Severity: Patient states wound are getting worse. Duration: Patient has had the wound for < 5 weeks prior to presenting for treatment Timing: Pain in wound is Intermittent (comes and goes Context: The wound would happen gradually Modifying Factors: Other treatment(s) tried include:admission to the hospital for sepsis and a  full workup with treatment with IV antibiotics and discharged on oral antibiotics Associated Signs and Symptoms: Patient reports having increase discharge. 36 year old patient well known to our [facility] wound care clinic where she has been seen since 2016 for bilateral lower extremity venous insufficiency disease with lymphedema and multiple ulcerations associated with morbid  obesity. she had custom-made compression stockings and lymphedema pumps which were used in the past. most recently she was admitted to the hospital between October 11 and 09/02/2017 with sepsis, lower extremity wounds and lymphedema.she was initially treated in the outpatient with Keflex and Bactrim. she was initially treated in the hospital with vancomycin and Zosyn and changed over to Unasyn until her white count improved and her blood cultures were negative for 3 days. After her inpatient management she was discharged home on Augmentin to end on 09/13/2017 with a 14 day course. she has had outpatient vascular duplex scans completed in November 2017 and her right ABI was 1.1 and the left ABI is 1.3. she had normal toe brachial indices bilaterally.she had three-vessel runoff in the right lower extremity and two-vessel runoff in the left lower extremity. On questioning the patient she does have custom made compression stockings and also has a lymphedema pump but has not been using it appropriately and has not been taking good care of herself. 09/17/2017 -- she returns today with compression stockings on the left side and the right side has had significant amount of drainage and has a very strong odor 09/24/2017 -- the drainage is increased significantly and she has more lymphedema and a very strong odor to her wound. Though she does not have systemic symptoms, or overt infection I believe she will benefit from some doxycycline given empirically. 10/01/2017 -- after starting the doxycycline and changing the dressing twice a week her symptoms and signs have definitely improved overall. 10/08/2017 -- she has completed her course of doxycycline but continues to have a lot of drainage and needs twice a week dressing changes. 11/08/17-she is here in follow-up evaluation for right lower extremity ulcers. She admits to using her lymphedema pumps twice daily, one hour per session. she is voicing no  complaints or concerns, no signs of infection will change to Consuelo Suthers, Isadore R. ([id number]) Patient History Information obtained from Patient. Family History Kidney Disease - Mother, No family history of Cancer, Diabetes, Heart Disease, Hereditary Spherocytosis, Hypertension, Lung Disease, Seizures, Stroke, Thyroid Problems, Tuberculosis. Social History Never smoker, Marital Status - Married, Alcohol Use - Never, Drug Use - No History, Caffeine Use - Daily. Objective Constitutional Vitals Time Taken: 9:10 AM, Height: 74 in, Weight: 505 lbs, BMI: 64.8, Temperature: 97.7 F, Pulse: 90 bpm, Respiratory Rate: 20 breaths/min, Blood Pressure: 130/85 mmHg. Integumentary (Hair, Skin) Wound #1 status is Open. Original cause of wound was Gradually Appeared. The wound is located on the Right,Proximal,Lateral Lower Leg. The wound measures 0.3cm length x 0.3cm width x 0.1cm depth; 0.071cm^2 area and 0.007cm^3 volume. There is Fat Layer (Subcutaneous Tissue) Exposed exposed. There is no tunneling or undermining noted. There is a large amount of serosanguineous drainage noted. Foul odor after cleansing was noted. The wound margin is flat and intact. There is large (67-100%) red granulation within the wound bed. There is a small (1-33%) amount of necrotic tissue within the wound bed including Adherent Slough. The periwound skin appearance did not exhibit: Callus, Crepitus, Excoriation, Induration, Rash, Scarring, Dry/Scaly, Maceration, Atrophie Blanche, Cyanosis, Ecchymosis, Hemosiderin Staining, Mottled, Pallor, Rubor, Erythema. Periwound temperature was noted as No Abnormality. The  periwound has tenderness on palpation. Wound #2 status is Open. Original cause of wound was Gradually Appeared. The wound is located on the Right,Distal,Lateral Lower Leg. The wound measures 3.6cm length x 6.1cm width x 0.3cm depth; 17.247cm^2 area and 5.174cm^3 volume. There is Fat Layer (Subcutaneous Tissue) Exposed  exposed. There is no tunneling or undermining noted. There is a large amount of serosanguineous drainage noted. Foul odor after cleansing was noted. The wound margin is flat and intact. There is medium (34-66%) red granulation within the wound bed. There is a medium (34-66%) amount of necrotic tissue within the wound bed including Adherent Slough. The periwound skin appearance exhibited: Maceration. The periwound skin appearance did not exhibit: Callus, Crepitus, Excoriation, Induration, Rash, Scarring, Dry/Scaly, Atrophie Blanche, Cyanosis, Ecchymosis, Hemosiderin Staining, Mottled, Pallor, Rubor, Erythema. Periwound temperature was noted as No Abnormality. The periwound has tenderness on palpation. Assessment Active Problems ICD-10 L97.212 - 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d [place], Shyanne R. ([id number]) I89.0 - Lymphedema, not elsewhere classified I87.311 - Chronic venous hypertension (idiopathic) with ulcer of right lower extremity E66.01 - Morbid (severe) obesity due to excess calories Procedures Wound #2 Pre-procedure diagnosis of Wound #2 is a Lymphedema located on the Right,Distal,Lateral Lower Leg . There was a Skin/Subcutaneous Tissue Debridement ([id number]) debridement with total area of 21.96 sq cm performed by Jessica Public, NP. with the following instrument(s): Curette to remove Viable and Non-Viable tissue/material including Exudate, Fibrin/Slough, and Subcutaneous after achieving pain control using Lidocaine 4% Topical Solution. A time out was conducted at 09:26, prior to the start of the procedure. A Minimum amount of bleeding was controlled with Pressure. The procedure was tolerated well with a pain level of 0 throughout and a pain level of 0 following the procedure. Post Debridement Measurements: 3.6cm length x 6.1cm width x 0.4cm depth; 6.899cm^3 volume. Character of Wound/Ulcer Post Debridement requires further debridement. Post procedure  Diagnosis Wound #2: Same as Pre-Procedure Plan Wound Cleansing: Wound #1 Right,Proximal,Lateral Lower Leg: Clean wound with Normal Saline. Cleanse wound with mild soap and water Wound #2 Right,Distal,Lateral Lower Leg: Clean wound with Normal Saline. Cleanse wound with mild soap and water Anesthetic (add to Medication List): Wound #1 Right,Proximal,Lateral Lower Leg: Topical Lidocaine 4% cream applied to wound bed prior to debridement (In Clinic Only). Wound #2 Right,Distal,Lateral Lower Leg: Topical Lidocaine 4% cream applied to wound bed prior to debridement (In Clinic Only). Skin Barriers/Peri-Wound Care: Wound #1 Right,Proximal,Lateral Lower Leg: Barrier cream Moisturizing lotion Wound #2 Right,Distal,Lateral Lower Leg: Barrier cream Moisturizing lotion Primary Wound Dressing: Wound #1 Right,Proximal,Lateral Lower Leg: Prisma Ag Wound #2 Right,Distal,Lateral Lower Leg: Prisma Ag Secondary Dressing: Wound #1 Right,Proximal,Lateral Lower Leg: ABD pad XtraSorb Other - charcoal Mceachin, Martika R. ([id number]) Wound #2 Right,Distal,Lateral Lower Leg: ABD pad XtraSorb Other - charcoal Dressing Change Frequency: Wound #1 Right,Proximal,Lateral Lower Leg: Dressing is to be changed Monday and Thursday. Wound #2 Right,Distal,Lateral Lower Leg: Dressing is to be changed Monday and Thursday. Follow-up Appointments: Return Appointment in 1 week. Nurse Visit as needed Edema Control: Wound #1 Right,Proximal,Lateral Lower Leg: 4-Layer Compression System - Right Lower Extremity - unna to anchor Elevate legs to the level of the heart and pump ankles as often as possible Wound #2 Right,Distal,Lateral Lower Leg: 4-Layer Compression System - Right Lower Extremity - unna to anchor Elevate legs to the level of the heart and pump ankles as often as possible Additional Orders / Instructions: Wound #1 Right,Proximal,Lateral Lower Leg: Vitamin A; Vitamin C, Zinc Increase protein  intake. Other: - Please watch your salt (sodium) intake Wound #2 Right,Distal,Lateral Lower Leg: Vitamin A; Vitamin C, Zinc Increase protein intake. Other: - Please watch your salt (sodium) intake The following medication(s) was prescribed: lidocaine topical 4 % cream 1 1 cream topical 1. switch to prisma 2. contnue with 4 layer compression, lymphedema pumps twice daily 3. follow up next week Electronic Signature(s) Signed: 11/08/2017 4:55:16 PM By: Jessica Public Entered By: Jessica Public on 11/08/2017 12:48:01 Susman, Isabela R. ([id number]) -------------------------------------------------------------------------------- ROS/PFSH Details Patient Name: Jessica Casey R. Date of Service: 11/08/2017 8:45 AM Medical Record Number: [id number] Patient Account Number: [id number] Date of Birth/Sex: [birth year] (36 y.o. Female) Treating RN: Ashok Cordia, Debi Primary Care Provider: PATIENT, NO Other Clinician: Referring Provider: Referral, Self Treating Provider/Extender: Kathreen Cosier in Treatment: 8 Information Obtained From Patient Wound History Do you currently have one or more open woundso Yes How many open wounds do you currently haveo 1 Approximately how long have you had your woundso 1 month How have you been treating your wound(s) until nowo aquacel ag Has your wound(s) ever healed and then re-openedo No Have you had any lab work done in the past montho No Have you tested positive for an antibiotic resistant organism (MRSA, VRE)o No Have you tested positive for osteomyelitis (bone infection)o No Have you had any tests for circulation on your legso Yes Who ordered the testo [facility] Where was the test doneo [facility] Hematologic/Lymphatic Medical History: Positive for: Lymphedema Respiratory Medical History: Negative for: Aspiration; Asthma; Chronic Obstructive Pulmonary Disease (COPD); Pneumothorax; Sleep Apnea; Tuberculosis Cardiovascular Medical History: Positive for:  Hypertension Negative for: Angina; Arrhythmia; Congestive Heart Failure; Coronary Artery Disease; Deep Vein Thrombosis; Hypotension; Myocardial Infarction; Peripheral Arterial Disease; Peripheral Venous Disease; Phlebitis; Vasculitis Immunizations Pneumococcal Vaccine: Received Pneumococcal Vaccination: No Immunization Notes: up to date Implantable Devices Family and Social History Cancer: No; Diabetes: No; Heart Disease: No; Hereditary Spherocytosis: No; Hypertension: No; Kidney Disease: Yes - Mother; Lung Disease: No; Seizures: No; Stroke: No; Thyroid Problems: No; Tuberculosis: No; Never smoker; Marital Status - Married; Alcohol Use: Never; Drug Use: No History; Caffeine Use: Daily; Financial Concerns: No; Food, Clothing or Shelter Needs: No; Support System Lacking: No; Transportation Concerns: No; Advanced Directives: No; Patient does not want information on Advanced Directives MURREL, FREET ([id number]) Physician Affirmation I have reviewed and agree with the above information. Electronic Signature(s) Signed: 11/08/2017 4:26:05 PM By: [name] Signed: 11/08/2017 4:55:16 PM By: Jessica Public Entered By: Jessica Public on 11/08/2017 09:40:36 Martino, Annaka R. ([id number]) -------------------------------------------------------------------------------- SuperBill Details Patient Name: Jessica Casey R. Date of Service: 11/08/2017 Medical Record Number: [id number] Patient Account Number: [id number] Date of Birth/Sex: 05-03-1981 (36 y.o. 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) with ulcer of right lower extremity E66.01  Morbid (severe) obesity due to excess calories Facility Procedures CPT4 Code: [id number] Description: 11042 - DEB SUBQ TISSUE 20 SQ CM/< ICD-10 Diagnosis Description L97.212 Non-pressure chronic ulcer of right calf with fat layer expo Modifier: sed Quantity: 1 CPT4 Code: [id number] Description: 11045 - DEB SUBQ TISS EA ADDL 20CM ICD-10 Diagnosis Description L97.212 Non-pressure chronic ulcer of right calf with fat layer expo Modifier: sed Quantity: 1 Physician Procedures CPT4 Code: [id number] Description: 11042 - WC PHYS SUBQ TISS 20 SQ CM  ICD-10 Diagnosis Description L97.212 Non-pressure chronic ulcer of right calf with fat layer expo Modifier: sed Quantity: 1 CPT4 Code: [id number] Description: 11045 - WC PHYS SUBQ TISS EA ADDL 20 CM ICD-10 Diagnosis Description L97.212 Non-pressure chronic ulcer of right calf with fat layer expo Modifier: sed Quantity: 1 Electronic Signature(s) Signed: 11/08/2017 4:55:16 PM By: Jessica Publicoulter, Rosaisela Jamroz Entered By: Jessica Publicoulter, Donnald Tabar on 11/08/2017 09:41:42

## 2017-11-15 ENCOUNTER — Encounter: Payer: Self-pay | Admitting: Surgery

## 2017-11-17 NOTE — Progress Notes (Signed)
[name], [name] ([id number]) Visit Report for 11/15/2017 Chief Complaint Document Details Patient Name: [name], [name] Date of Service: 11/15/2017 8:15 AM Medical Record Number: [id number] Patient Account Number: [id number] Date of Birth/Sex: 02/21/1981 (36 y.o. Female) Treating RN: [name], [name] Primary Care Provider: PATIENT, NO Other Clinician: Referring Provider: Referral, Self Treating Provider/Extender: Jessica ReBritto, [name]: 9 Information Obtained from: Patient Chief Complaint She is here in follow up evaluation of right lower extremity ulcers Electronic Signature(s) Signed: 11/15/2017 9:20:08 AM By: [name] Entered By: Jessica KannerBritto, [name] on 11/15/2017 09:20:08 [name], [name] ([id number]) -------------------------------------------------------------------------------- HPI Details Patient Name: [name], [name] Date of Service: 11/15/2017 8:15 AM Medical Record Number: [id number] Patient Account Number: [id number] Date of Birth/Sex: 02/21/1981 (36 y.o. Female) Treating RN: [name], [name] Primary Care Provider: PATIENT, NO Other Clinician: Referring Provider: Referral, Self Treating Provider/Extender: Jessica ReBritto, [name] Weeks in [name]: 9 History of Present Illness Location: right lower extremity lateral calf several large areas Quality: Patient reports experiencing a sharp pain to affected area(s). Severity: Patient states wound are getting worse. Duration: Patient has had the wound for < 5 weeks prior to presenting for [name] Timing: Pain in wound is Intermittent (comes and goes Context: The wound would happen gradually Modifying Factors: Other [name](s) tried include:admission to the hospital for sepsis and a full workup with [name] with IV antibiotics and discharged on oral antibiotics Associated Signs and Symptoms: Patient reports having increase discharge. HPI Description: 36 year old [facility]  where she has been seen since 2016 for bilateral lower extremity venous insufficiency disease with lymphedema and multiple ulcerations associated with morbid obesity. she had custom-made compression stockings and lymphedema pumps which were used in the past. most recently she was admitted to the hospital between October 11 and 09/02/2017 with sepsis, lower extremity wounds and lymphedema.she was initially treated in the outpatient with Keflex and Bactrim. she was initially treated in the hospital with vancomycin and Zosyn and changed over to Unasyn until her white count improved and her blood cultures were negative for 3 days. After her inpatient management she was discharged home on Augmentin to end on 09/13/2017 with a 14 day course. she has had outpatient vascular duplex scans completed in November 2017 and her right ABI was 1.1 and the left ABI is 1.3. she had normal toe brachial indices bilaterally.she had three-vessel runoff in the right lower extremity and two-vessel runoff in the left lower extremity. On questioning the patient she does have custom made compression stockings and also has a lymphedema pump but has not been using it appropriately and has not been taking good care of herself. 09/17/2017 -- she returns today with compression stockings on the left side and the right side has had significant amount of drainage and has a very strong odor 09/24/2017 -- the drainage is increased significantly and she has more lymphedema and a very strong odor to her wound. Though she does not have systemic symptoms, or overt infection I believe she will benefit from some doxycycline given empirically. 10/01/2017 -- after starting the doxycycline and changing the dressing twice a week her symptoms and signs have definitely improved overall. 10/08/2017 -- she has completed her course of doxycycline but continues to have a lot of drainage and needs twice a week dressing changes. 11/08/17-she is  here in follow-up evaluation for right lower extremity ulcers. She admits to using her lymphedema pumps twice daily, one hour per session. she is voicing no complaints or concerns,  no signs of infection will change to [employer]) Signed: 11/15/2017 9:20:14 AM By: Jessica Kanner MD, [name] Entered By: Jessica Kanner on 11/15/2017 09:20:14 [name] ([id number]) -------------------------------------------------------------------------------- Physical Exam Details Patient Name: Jessica Casey R. Date of Service: 11/15/2017 8:15 AM Medical Record Number: [id number] Patient Account Number: [id number] Date of Birth/Sex: August 20, 1981 (36 y.o. Female) Treating RN: Jessica Crigler Primary Care Provider: PATIENT, NO Other Clinician: Referring Provider: Referral, Self Treating Provider/Extender: [name] in [name]: 9 Constitutional . Pulse regular. Respirations normal and unlabored. Afebrile. . Eyes Nonicteric. Reactive to light. Ears, Nose, Mouth, and Throat Lips, teeth, and gums WNL.Marland Kitchen Moist mucosa without lesions. Neck supple and nontender. No palpable supraclavicular or cervical adenopathy. Normal sized without goiter. Respiratory WNL. No retractions.. Cardiovascular Pedal Pulses WNL. No clubbing, cyanosis or edema. Lymphatic No adneopathy. No adenopathy. No adenopathy. Musculoskeletal Adexa without tenderness or enlargement.. Digits and nails w/o clubbing, cyanosis, infection, petechiae, ischemia, or inflammatory conditions.. Integumentary (Hair, Skin) No suspicious lesions. No crepitus or fluctuance. No peri-wound warmth or erythema. No masses.Marland Kitchen Psychiatric Judgement and insight Intact.. No evidence of depression, anxiety, or agitation.. Notes the wound looks very good and has a bit of hyper granulation tissue and no evidence of any cellulitis in the lymphedema is well controlled Electronic Signature(s) Signed: 11/15/2017 9:20:45 AM By: Jessica Kanner  MD, [name] Entered By: Jessica Kanner on 11/15/2017 09:20:45 [name] ([id number]) -------------------------------------------------------------------------------- Physician Orders Details Patient Name: Jessica Casey R. Date of Service: 11/15/2017 8:15 AM Medical Record Number: [id number] Patient Account Number: [id number] Date of Birth/Sex: [birth year] (36 y.o. Female) Treating RN: Jessica Crigler Primary Care Provider: PATIENT, NO Other Clinician: Referring Provider: Referral, Self Treating Provider/Extender: [name] in [name]: 9 Verbal / Phone Orders: No Diagnosis Coding Wound Cleansing Wound #1 Right,Proximal,Lateral Lower Leg o Clean wound with Normal Saline. o Cleanse wound with mild soap and water Wound #2 Right,Distal,Lateral Lower Leg o Clean wound with Normal Saline. o Cleanse wound with mild soap and water Anesthetic (add to Medication List) Wound #1 Right,Proximal,Lateral Lower Leg o Topical Lidocaine 4% cream applied to wound bed prior to debridement (In Clinic Only). Wound #2 Right,Distal,Lateral Lower Leg o Topical Lidocaine 4% cream applied to wound bed prior to debridement (In Clinic Only). Skin Barriers/Peri-Wound Care Wound #1 Right,Proximal,Lateral Lower Leg o Barrier cream o Moisturizing lotion Wound #2 Right,Distal,Lateral Lower Leg o Barrier cream o Moisturizing lotion Primary Wound Dressing Wound #1 Right,Proximal,Lateral Lower Leg o Hydrafera Blue Wound #2 Right,Distal,Lateral Lower Leg o Hydrafera Blue Secondary Dressing Wound #1 Right,Proximal,Lateral Lower Leg o ABD pad o XtraSorb o Other - charcoal Wound #2 Right,Distal,Lateral Lower Leg o ABD pad o XtraSorb o Other - charcoal Key, Copelyn R. ([id number]) Dressing Change Frequency Wound #1 Right,Proximal,Lateral Lower Leg o Dressing is to be changed Monday and Thursday. Wound #2 Right,Distal,Lateral Lower Leg o Dressing is to be  changed Monday and Thursday. Follow-up Appointments o Return Appointment in 1 week. o Nurse Visit as needed Edema Control Wound #1 Right,Proximal,Lateral Lower Leg o 4-Layer Compression System - Right Lower Extremity - unna to anchor o Elevate legs to the level of the heart and pump ankles as often as possible Wound #2 Right,Distal,Lateral Lower Leg o 4-Layer Compression System - Right Lower Extremity - unna to anchor o Elevate legs to the level of the heart and pump ankles as often as possible Additional Orders / Instructions Wound #1 Right,Proximal,Lateral Lower Leg o Vitamin A; Vitamin C, Zinc o Increase protein intake. o Other: -  Please watch your salt (sodium) intake Wound #2 Right,Distal,Lateral Lower Leg o Vitamin A; Vitamin C, Zinc o Increase protein intake. o Other: - Please watch your salt (sodium) intake Electronic Signature(s) Signed: 11/15/2017 5:09:21 PM By: Jessica Kanner MD, [name] Signed: 11/16/2017 2:36:38 PM By: Jessica Crigler Entered By: Jessica Crigler on 11/15/2017 09:13:18 Patalano, Kobi R. ([id number]) -------------------------------------------------------------------------------- Problem List Details Patient Name: Jessica Casey R. Date of Service: 11/15/2017 8:15 AM Medical Record Number: [id number] Patient Account Number: [id number] Date of Birth/Sex: 10-15-1981 (36 y.o. Female) Treating RN: Jessica Crigler Primary Care Provider: PATIENT, NO Other Clinician: Referring Provider: Referral, Self Treating Provider/Extender: [name] in [name]: 9 Active Problems ICD-10 Encounter Code Description Active Date Diagnosis L97.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d 09/10/2017 Yes I89.0 Lymphedema, not elsewhere classified 09/10/2017 Yes I87.311 Chronic venous hypertension (idiopathic) with ulcer of right lower 09/10/2017 Yes extremity E66.01 Morbid (severe) obesity due to excess calories  09/10/2017 Yes Inactive Problems Resolved Problems Electronic Signature(s) Signed: 11/15/2017 9:19:53 AM By: Jessica Kanner MD, [name] Entered By: Jessica Kanner on 11/15/2017 09:19:53 [name], [name] ([id number]) -------------------------------------------------------------------------------- Progress Note Details Patient Name: Jessica Casey R. Date of Service: 11/15/2017 8:15 AM Medical Record Number: [id number] Patient Account Number: [id number] Date of Birth/Sex: 13-Nov-1981 (36 y.o. Female) Treating RN: Jessica Crigler Primary Care Provider: PATIENT, NO Other Clinician: Referring Provider: Referral, Self Treating Provider/Extender: [name] in [name]: 9 Subjective Chief Complaint Information obtained from Patient She is here in follow up evaluation of right lower extremity ulcers History of Present Illness (HPI) The following HPI elements were documented for the patient's wound: Location: right lower extremity lateral calf several large areas Quality: Patient reports experiencing a sharp pain to affected area(s). Severity: Patient states wound are getting worse. Duration: Patient has had the wound for < 5 weeks prior to presenting for [name] Timing: Pain in wound is Intermittent (comes and goes Context: The wound would happen gradually Modifying Factors: Other [name](s) tried include:admission to the hospital for sepsis and a full workup with [name] with IV antibiotics and discharged on oral antibiotics Associated Signs and Symptoms: Patient reports having increase discharge. 36 year old patient well known to our [facility] wound care clinic where she has been seen since 2016 for bilateral lower extremity venous insufficiency disease with lymphedema and multiple ulcerations associated with morbid obesity. she had custom-made compression stockings and lymphedema pumps which were used in the past. most recently she was admitted to the hospital between October 11  and 09/02/2017 with sepsis, lower extremity wounds and lymphedema.she was initially treated in the outpatient with Keflex and Bactrim. she was initially treated in the hospital with vancomycin and Zosyn and changed over to Unasyn until her white count improved and her blood cultures were negative for 3 days. After her inpatient management she was discharged home on Augmentin to end on 09/13/2017 with a 14 day course. she has had outpatient vascular duplex scans completed in November 2017 and her right ABI was 1.1 and the left ABI is 1.3. she had normal toe brachial indices bilaterally.she had three-vessel runoff in the right lower extremity and two-vessel runoff in the left lower extremity. On questioning the patient she does have custom made compression stockings and also has a lymphedema pump but has not been using it appropriately and has not been taking good care of herself. 09/17/2017 -- she returns today with compression stockings on the left side and the right side has had significant amount of drainage and has a very  strong odor 09/24/2017 -- the drainage is increased significantly and she has more lymphedema and a very strong odor to her wound. Though she does not have systemic symptoms, or overt infection I believe she will benefit from some doxycycline given empirically. 10/01/2017 -- after starting the doxycycline and changing the dressing twice a week her symptoms and signs have definitely improved overall. 10/08/2017 -- she has completed her course of doxycycline but continues to have a lot of drainage and needs twice a week dressing changes. 11/08/17-she is here in follow-up evaluation for right lower extremity ulcers. She admits to using her lymphedema pumps twice daily, one hour per session. she is voicing no complaints or concerns, no signs of infection will change to [name], [name] ([id number]) Patient History Information obtained from Patient. Family History Kidney  Disease - Mother, No family history of Cancer, Diabetes, Heart Disease, Hereditary Spherocytosis, Hypertension, Lung Disease, Seizures, Stroke, Thyroid Problems, Tuberculosis. Social History Never smoker, Marital Status - Married, Alcohol Use - Never, Drug Use - No History, Caffeine Use - Daily. Objective Constitutional Pulse regular. Respirations normal and unlabored. Afebrile. Vitals Time Taken: 8:26 AM, Height: 74 in, Weight: 505 lbs, BMI: 64.8, Temperature: 98.1 F, Pulse: 90 bpm, Respiratory Rate: 20 breaths/min, Blood Pressure: 133/80 mmHg. Eyes Nonicteric. Reactive to light. Ears, Nose, Mouth, and Throat Lips, teeth, and gums WNL.Marland Kitchen Moist mucosa without lesions. Neck supple and nontender. No palpable supraclavicular or cervical adenopathy. Normal sized without goiter. Respiratory WNL. No retractions.. Cardiovascular Pedal Pulses WNL. No clubbing, cyanosis or edema. Lymphatic No adneopathy. No adenopathy. No adenopathy. Musculoskeletal Adexa without tenderness or enlargement.. Digits and nails w/o clubbing, cyanosis, infection, petechiae, ischemia, or inflammatory conditions.Marland Kitchen Psychiatric Judgement and insight Intact.. No evidence of depression, anxiety, or agitation.. General Notes: the wound looks very good and has a bit of hyper granulation tissue and no evidence of any cellulitis in the lymphedema is well controlled [name], [name] R. ([id number]) Integumentary (Hair, Skin) No suspicious lesions. No crepitus or fluctuance. No peri-wound warmth or erythema. No masses.. Wound #1 status is Open. Original cause of wound was Gradually Appeared. The wound is located on the Right,Proximal,Lateral Lower Leg. The wound measures 0.3cm length x 0.3cm width x 0.1cm depth; 0.071cm^2 area and 0.007cm^3 volume. There is Fat Layer (Subcutaneous Tissue) Exposed exposed. There is no tunneling or undermining noted. There is a large amount of serosanguineous drainage noted. Foul odor after  cleansing was noted. The wound margin is flat and intact. There is large (67-100%) red granulation within the wound bed. There is a small (1-33%) amount of necrotic tissue within the wound bed including Adherent Slough. The periwound skin appearance did not exhibit: Callus, Crepitus, Excoriation, Induration, Rash, Scarring, Dry/Scaly, Maceration, Atrophie Blanche, Cyanosis, Ecchymosis, Hemosiderin Staining, Mottled, Pallor, Rubor, Erythema. Periwound temperature was noted as No Abnormality. The periwound has tenderness on palpation. Wound #2 status is Open. Original cause of wound was Gradually Appeared. The wound is located on the Right,Distal,Lateral Lower Leg. The wound measures 3.1cm length x 6.2cm width x 0.2cm depth; 15.095cm^2 area and 3.019cm^3 volume. There is Fat Layer (Subcutaneous Tissue) Exposed exposed. There is no tunneling or undermining noted. There is a small amount of serosanguineous drainage noted. Foul odor after cleansing was noted. The wound margin is flat and intact. There is medium (34-66%) red granulation within the wound bed. There is a medium (34-66%) amount of necrotic tissue within the wound bed including Adherent Slough. The periwound skin appearance did not exhibit: Callus, Crepitus, Excoriation, Induration, Rash,  Scarring, Dry/Scaly, Maceration, Atrophie Blanche, Cyanosis, Ecchymosis, Hemosiderin Staining, Mottled, Pallor, Rubor, Erythema. Periwound temperature was noted as No Abnormality. The periwound has tenderness on palpation. Assessment Active Problems ICD-10 L97.212 - 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d I89.0 - Lymphedema, not elsewhere classified I87.311 - Chronic venous hypertension (idiopathic) with ulcer of right lower extremity E66.01 - Morbid (severe) obesity due to excess calories Plan Wound Cleansing: Wound #1 Right,Proximal,Lateral Lower Leg: Clean wound with Normal Saline. Cleanse wound with mild soap and  water Wound #2 Right,Distal,Lateral Lower Leg: Clean wound with Normal Saline. Cleanse wound with mild soap and water Anesthetic (add to Medication List): Wound #1 Right,Proximal,Lateral Lower Leg: Topical Lidocaine 4% cream applied to wound bed prior to debridement (In Clinic Only). Wound #2 Right,Distal,Lateral Lower Leg: Topical Lidocaine 4% cream applied to wound bed prior to debridement (In Clinic Only). Skin Barriers/Peri-Wound Care: Wound #1 Right,Proximal,Lateral Lower Leg: Barrier cream Dimock, Stellar R. ([id number]) Moisturizing lotion Wound #2 Right,Distal,Lateral Lower Leg: Barrier cream Moisturizing lotion Primary Wound Dressing: Wound #1 Right,Proximal,Lateral Lower Leg: Hydrafera Blue Wound #2 Right,Distal,Lateral Lower Leg: Hydrafera Blue Secondary Dressing: Wound #1 Right,Proximal,Lateral Lower Leg: ABD pad XtraSorb Other - charcoal Wound #2 Right,Distal,Lateral Lower Leg: ABD pad XtraSorb Other - charcoal Dressing Change Frequency: Wound #1 Right,Proximal,Lateral Lower Leg: Dressing is to be changed Monday and Thursday. Wound #2 Right,Distal,Lateral Lower Leg: Dressing is to be changed Monday and Thursday. Follow-up Appointments: Return Appointment in 1 week. Nurse Visit as needed Edema Control: Wound #1 Right,Proximal,Lateral Lower Leg: 4-Layer Compression System - Right Lower Extremity - unna to anchor Elevate legs to the level of the heart and pump ankles as often as possible Wound #2 Right,Distal,Lateral Lower Leg: 4-Layer Compression System - Right Lower Extremity - unna to anchor Elevate legs to the level of the heart and pump ankles as often as possible Additional Orders / Instructions: Wound #1 Right,Proximal,Lateral Lower Leg: Vitamin A; Vitamin C, Zinc Increase protein intake. Other: - Please watch your salt (sodium) intake Wound #2 Right,Distal,Lateral Lower Leg: Vitamin A; Vitamin C, Zinc Increase protein intake. Other: - Please watch  your salt (sodium) intake she continues to make progress and after review today I have recommended: 1. Hydrofera Blue with a 4 layer compression wrap to the right lower extremity - we will also use some extrasorb and some carboflex. She will need a nurse visit during the week -- on Monday. 2. Use her custom built compression stockings of the left lower extremity 3. Lymphedema pumps to be utilized twice a day for an hour each 4. Elevation and exercises been discussed with her in great detail 5. Adequate protein, vitamin A, vitamin C and zinc 6. I have discussed her diet and the need to watch her salt and fluid intake and she says she will be careful 7. Regular visits to the wound center [name], [name] ([id number]) Electronic Signature(s) Signed: 11/15/2017 9:21:57 AM By: Jessica KannerBritto, Mckenna Gamm MD, [name] Entered By: Jessica KannerBritto, Zorianna Taliaferro on 11/15/2017 09:21:56 [name], [name] ([id number]) -------------------------------------------------------------------------------- ROS/PFSH Details Patient Name: [name], [name] Date of Service: 11/15/2017 8:15 AM Medical Record Number: [id number] Patient Account Number: [id number] Date of Birth/Sex: [birth year] (36 y.o. Female) Treating RN: [name], [name] Primary Care Provider: PATIENT, NO Other Clinician: Referring Provider: Referral, Self Treating Provider/Extender: Jessica ReBritto, Abram Sax Weeks in [name]: 9 Information Obtained From Patient Wound History Do you currently have one or more open woundso Yes How many open wounds do you currently haveo 1 Approximately how long have you had your  woundso 1 month How have you been treating your wound(s) until nowo aquacel ag Has your wound(s) ever healed and then [name]-openedo No Have you had any lab work done in the past montho No Have you tested positive for an antibiotic resistant organism (MRSA, VRE)o No Have you tested positive for osteomyelitis (bone infection)o No Have you had any tests for circulation on your legso  Yes Who ordered the testo [facility] Where was the test doneo [facility] Hematologic/Lymphatic Medical History: Positive for: Lymphedema Respiratory Medical History: Negative for: Aspiration; Asthma; Chronic Obstructive Pulmonary Disease (COPD); Pneumothorax; Sleep Apnea; Tuberculosis Cardiovascular Medical History: Positive for: Hypertension Negative for: Angina; Arrhythmia; Congestive Heart Failure; Coronary Artery Disease; Deep Vein Thrombosis; Hypotension; Myocardial Infarction; Peripheral Arterial Disease; Peripheral Venous Disease; Phlebitis; Vasculitis Immunizations Pneumococcal Vaccine: Received Pneumococcal Vaccination: No Immunization Notes: up to date Implantable Devices Family and Social History Cancer: No; Diabetes: No; Heart Disease: No; Hereditary Spherocytosis: No; Hypertension: No; Kidney Disease: Yes - Mother; Lung Disease: No; Seizures: No; Stroke: No; Thyroid Problems: No; Tuberculosis: No; Never smoker; Marital Status - Married; Alcohol Use: Never; Drug Use: No History; Caffeine Use: Daily; Financial Concerns: No; Food, Clothing or Shelter Needs: No; Support System Lacking: No; Transportation Concerns: No; Advanced Directives: No; Patient does not want information on Advanced Directives ANAELLE, DUNTON ([id number]) Electronic Signature(s) Signed: 11/15/2017 5:09:21 PM By: Jessica Kanner MD, [name] Signed: 11/16/2017 2:36:38 PM By: Jessica Crigler Entered By: Jessica Kanner on 11/15/2017 09:20:19 Mika, Cristal Deer ([id number]) -------------------------------------------------------------------------------- SuperBill Details Patient Name: Jessica Casey R. Date of Service: 11/15/2017 Medical Record Number: [id number] Patient Account Number: [id number] Date of Birth/Sex: 09-03-1981 (36 y.o. Female) Treating RN: Jessica Crigler Primary Care Provider: PATIENT, NO Other Clinician: Referring Provider: Referral, Self Treating Provider/Extender: [name] in [name]:  9 Diagnosis Coding ICD-10 Codes Code Description [phone] 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d I89.0 Lymphedema, not elsewhere classified I87.311 Chronic venous hypertension (idiopathic) with ulcer of right lower extremity E66.01 Morbid (severe) obesity due to excess calories Facility Procedures CPT4 Code: [id number] Description: 99213 - WOUND CARE VISIT-LEV 3 EST PT Modifier: Quantity: 1 Physician Procedures CPT4 Code: [id number] Description: 99213 - WC PHYS LEVEL 3 - EST PT ICD-10 Diagnosis Description L97.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 I89.0 Lymphedema, not elsewhere classified I87.311 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

## 2017-11-17 NOTE — Progress Notes (Signed)
[name], Vonette R. ([id number]) Visit Report for 11/15/2017 Arrival Information Details Patient Name: [name], [name] Date of Service: 11/15/2017 8:15 AM Medical Record Number: [id number] Patient Account Number: [id number] Date of Birth/Sex: 02/03/1981 (36 y.o. Female) Treating RN: Renne CriglerFlinchum, Cheryl Primary Care Tyray Proch: PATIENT, NO Other Clinician: Referring Micala Saltsman: Referral, Self Treating Dayanna Pryce/Extender: Rudene ReBritto, Errol Weeks in Treatment: 9 Visit Information History Since Last Visit All ordered tests and consults were completed: No Patient Arrived: Ambulatory Added or deleted any medications: No Arrival Time: 08:25 Any new allergies or adverse reactions: No Accompanied By: self Had a fall or experienced change in No Transfer Assistance: None activities of daily living that may affect Patient Identification Verified: Yes risk of falls: Secondary Verification Process Completed: Yes Signs or symptoms of abuse/neglect since last visito No Patient Requires Transmission-Based No Hospitalized since last visit: No Precautions: Pain Present Now: No Patient Has Alerts: No Electronic Signature(s) Signed: 11/16/2017 2:36:38 PM By: Renne CriglerFlinchum, Cheryl Entered By: Renne CriglerFlinchum, Cheryl on 11/15/2017 08:25:25 Harroun, Julene R. ([id number]) -------------------------------------------------------------------------------- Clinic Level of Care Assessment Details Patient Name: Jessica CaseyLEATH, [name] Date of Service: 11/15/2017 8:15 AM Medical Record Number: [id number] Patient Account Number: [id number] Date of Birth/Sex: 02/03/1981 (36 y.o. Female) Treating RN: Renne CriglerFlinchum, Cheryl Primary Care Tavi Gaughran: PATIENT, NO Other Clinician: Referring Kennith Morss: Referral, Self Treating Maxson Oddo/Extender: Rudene ReBritto, Errol Weeks in Treatment: 9 Clinic Level of Care Assessment Items TOOL 4 Quantity Score []  - Use when only an EandM is performed on FOLLOW-UP visit 0 ASSESSMENTS - Nursing Assessment / Reassessment []  -  Reassessment of Co-morbidities (includes updates in patient status) 0 []  - 0 Reassessment of Adherence to Treatment Plan ASSESSMENTS - Wound and Skin Assessment / Reassessment X - Simple Wound Assessment / Reassessment - one wound 1 5 []  - 0 Complex Wound Assessment / Reassessment - multiple wounds []  - 0 Dermatologic / Skin Assessment (not related to wound area) ASSESSMENTS - Focused Assessment []  - Circumferential Edema Measurements - multi extremities 0 []  - 0 Nutritional Assessment / Counseling / Intervention []  - 0 Lower Extremity Assessment (monofilament, tuning fork, pulses) []  - 0 Peripheral Arterial Disease Assessment (using hand held doppler) ASSESSMENTS - Ostomy and/or Continence Assessment and Care []  - Incontinence Assessment and Management 0 []  - 0 Ostomy Care Assessment and Management (repouching, etc.) PROCESS - Coordination of Care X - Simple Patient / Family Education for ongoing care 1 15 []  - 0 Complex (extensive) Patient / Family Education for ongoing care []  - 0 Staff obtains ChiropractorConsents, Records, Test Results / Process Orders []  - 0 Staff telephones HHA, Nursing Homes / Clarify orders / etc []  - 0 Routine Transfer to another Facility (non-emergent condition) []  - 0 Routine Hospital Admission (non-emergent condition) []  - 0 New Admissions / Manufacturing engineernsurance Authorizations / Ordering NPWT, Apligraf, etc. []  - 0 Emergency Hospital Admission (emergent condition) X- 1 10 Simple Discharge Coordination Tsao, Tamantha R. ([id number]) []  - 0 Complex (extensive) Discharge Coordination PROCESS - Special Needs []  - Pediatric / Minor Patient Management 0 []  - 0 Isolation Patient Management []  - 0 Hearing / Language / Visual special needs []  - 0 Assessment of Community assistance (transportation, D/C planning, etc.) []  - 0 Additional assistance / Altered mentation []  - 0 Support Surface(s) Assessment (bed, cushion, seat, etc.) INTERVENTIONS - Wound Cleansing /  Measurement X - Simple Wound Cleansing - one wound 1 5 []  - 0 Complex Wound Cleansing - multiple wounds X- 1 5 Wound Imaging (photographs - any number of wounds) []  - 0 Wound Tracing (instead  of photographs) X- 1 5 Simple Wound Measurement - one wound []  - 0 Complex Wound Measurement - multiple wounds INTERVENTIONS - Wound Dressings []  - Small Wound Dressing one or multiple wounds 0 X- 2 15 Medium Wound Dressing one or multiple wounds []  - 0 Large Wound Dressing one or multiple wounds []  - 0 Application of Medications - topical []  - 0 Application of Medications - injection INTERVENTIONS - Miscellaneous []  - External ear exam 0 []  - 0 Specimen Collection (cultures, biopsies, blood, body fluids, etc.) []  - 0 Specimen(s) / Culture(s) sent or taken to Lab for analysis []  - 0 Patient Transfer (multiple staff / Nurse, adult / Similar devices) []  - 0 Simple Staple / Suture removal (25 or less) []  - 0 Complex Staple / Suture removal (26 or more) []  - 0 Hypo / Hyperglycemic Management (close monitor of Blood Glucose) []  - 0 Ankle / Brachial Index (ABI) - do not check if billed separately X- 1 5 Vital Signs Cheaney, Wenda R. ([id number]) Has the patient been seen at the hospital within the last three years: Yes Total Score: 80 Level Of Care: New/Established - Level 3 Electronic Signature(s) Signed: 11/16/2017 2:36:38 PM By: Renne Crigler Entered By: Renne Crigler on 11/15/2017 09:14:07 Punt, Bernadene R. ([id number]) -------------------------------------------------------------------------------- Encounter Discharge Information Details Patient Name: Jessica Casey R. Date of Service: 11/15/2017 8:15 AM Medical Record Number: [id number] Patient Account Number: [id number] Date of Birth/Sex: 04/05/1981 (37 y.o. Female) Treating RN: Renne Crigler Primary Care Denton Derks: PATIENT, NO Other Clinician: Referring Kauan Kloosterman: Referral, Self Treating Tanaiya Kolarik/Extender: Rudene Re in Treatment: 9 Encounter Discharge Information Items Schedule Follow-up Appointment: No Medication Reconciliation completed and No provided to Patient/Care Derrall Hicks: Provided on Clinical Summary of Care: 11/15/2017 Form Type Recipient Paper Patient EL Electronic Signature(s) Signed: 11/16/2017 2:37:55 PM By: Gwenlyn Perking Entered By: Gwenlyn Perking on 11/15/2017 09:14:38 Cotugno, Rada R. ([id number]) -------------------------------------------------------------------------------- Lower Extremity Assessment Details Patient Name: Jessica Casey R. Date of Service: 11/15/2017 8:15 AM Medical Record Number: [id number] Patient Account Number: [id number] Date of Birth/Sex: [birth year] (36 y.o. Female) Treating RN: Renne Crigler Primary Care Venus Gilles: PATIENT, NO Other Clinician: Referring Berry Godsey: Referral, Self Treating Jerald Hennington/Extender: Rudene Re in Treatment: 9 Edema Assessment Assessed: [Left: No] [Right: No] Edema: [Left: Ye] [Right: s] Calf Left: Right: Point of Measurement: 35 cm From Medial Instep cm 56.5 cm Ankle Left: Right: Point of Measurement: 9 cm From Medial Instep cm 36.5 cm Vascular Assessment Claudication: Claudication Assessment [Right:None] Pulses: Dorsalis Pedis Palpable: [Right:Yes] Posterior Tibial Extremity colors, hair growth, and conditions: Extremity Color: [Right:Hyperpigmented] Hair Growth on Extremity: [Right:Yes] Temperature of Extremity: [Right:Cool] Capillary Refill: [Right:> 3 seconds] Toe Nail Assessment Left: Right: Thick: Yes Discolored: Yes Deformed: Yes Improper Length and Hygiene: Yes Electronic Signature(s) Signed: 11/16/2017 2:36:38 PM By: Renne Crigler Entered By: Renne Crigler on 11/15/2017 08:46:07 Schnapp, Jahna R. ([id number]) -------------------------------------------------------------------------------- Multi Wound Chart Details Patient Name: Jessica Casey R. Date of Service: 11/15/2017 8:15  AM Medical Record Number: [id number] Patient Account Number: [id number] Date of Birth/Sex: 1981/02/25 (36 y.o. Female) Treating RN: Renne Crigler Primary Care Jesicca Dipierro: PATIENT, NO Other Clinician: Referring Heidee Audi: Referral, Self Treating Karol Skarzynski/Extender: Rudene Re in Treatment: 9 Vital Signs Height(in): 74 Pulse(bpm): 90 Weight(lbs): 505 Blood Pressure(mmHg): 133/80 Body Mass Index(BMI): 65 Temperature(F): 98.1 Respiratory Rate 20 (breaths/min): Photos: [1:No Photos] [2:No Photos] [N/A:N/A] Wound Location: [1:Right Lower Leg - Lateral, Proximal] [2:Right Lower Leg - Lateral, Distal] [N/A:N/A] Wounding Event: [1:Gradually Appeared] [2:Gradually Appeared] [N/A:N/A] Primary Etiology: [1:Lymphedema] [2:Lymphedema] [N/A:N/A] Comorbid History: [  1:Lymphedema, Hypertension] [2:Lymphedema, Hypertension] [N/A:N/A] Date Acquired: [1:08/08/2017] [2:08/08/2017] [N/A:N/A] Weeks of Treatment: [1:9] [2:9] [N/A:N/A] Wound Status: [1:Open] [2:Open] [N/A:N/A] Measurements L x W x D [1:0.3x0.3x0.1] [2:3.1x6.2x0.2] [N/A:N/A] (cm) Area (cm) : [1:0.071] [2:15.095] [N/A:N/A] Volume (cm) : [1:0.007] [2:3.019] [N/A:N/A] % Reduction in Area: [1:95.30%] [2:29.60%] [N/A:N/A] % Reduction in Volume: [1:97.70%] [2:53.10%] [N/A:N/A] Classification: [1:Full Thickness With Exposed Support Structures] [2:Full Thickness With Exposed Support Structures] [N/A:N/A] Exudate Amount: [1:Large] [2:Small] [N/A:N/A] Exudate Type: [1:Serosanguineous] [2:Serosanguineous] [N/A:N/A] Exudate Color: [1:red, brown] [2:red, brown] [N/A:N/A] Foul Odor After Cleansing: [1:Yes] [2:Yes] [N/A:N/A] Odor Anticipated Due to [1:No] [2:No] [N/A:N/A] Product Use: Wound Margin: [1:Flat and Intact] [2:Flat and Intact] [N/A:N/A] Granulation Amount: [1:Large (67-100%)] [2:Medium (34-66%)] [N/A:N/A] Granulation Quality: [1:Red] [2:Red] [N/A:N/A] Necrotic Amount: [1:Small (1-33%)] [2:Medium (34-66%)] [N/A:N/A] Exposed  Structures: [1:Fat Layer (Subcutaneous Tissue) Exposed: Yes Fascia: No Tendon: No Muscle: No Joint: No Bone: No] [2:Fat Layer (Subcutaneous Tissue) Exposed: Yes Fascia: No Tendon: No Muscle: No Joint: No Bone: No] [N/A:N/A] Epithelialization: [1:None] [2:None] [N/A:N/A] Periwound Skin Texture: [1:Excoriation: No Induration: No] [2:Excoriation: No Induration: No] [N/A:N/A] Callus: No Callus: No Crepitus: No Crepitus: No Rash: No Rash: No Scarring: No Scarring: No Periwound Skin Moisture: Maceration: No Maceration: No N/A Dry/Scaly: No Dry/Scaly: No Periwound Skin Color: Atrophie Blanche: No Atrophie Blanche: No N/A Cyanosis: No Cyanosis: No Ecchymosis: No Ecchymosis: No Erythema: No Erythema: No Hemosiderin Staining: No Hemosiderin Staining: No Mottled: No Mottled: No Pallor: No Pallor: No Rubor: No Rubor: No Temperature: No Abnormality No Abnormality N/A Tenderness on Palpation: Yes Yes N/A Wound Preparation: Ulcer Cleansing: Ulcer Cleansing: N/A Rinsed/Irrigated with Saline, Rinsed/Irrigated with Saline, Other: soap and water Other: soap and water Topical Anesthetic Applied: Topical Anesthetic Applied: Other: lidocaine 4% Other: lidocaine 4% Treatment Notes Wound #1 (Right, Proximal, Lateral Lower Leg) 1. Cleansed with: Clean wound with Normal Saline 2. Anesthetic Topical Lidocaine 4% cream to wound bed prior to debridement 3. Peri-wound Care: Moisturizing lotion 4. Dressing Applied: Hydrafera Blue 5. Secondary Dressing Applied ABD Pad 7. Secured with 4-Layer Compression System - Right Lower Extremity Notes xtrasorb Wound #2 (Right, Distal, Lateral Lower Leg) 1. Cleansed with: Clean wound with Normal Saline 2. Anesthetic Topical Lidocaine 4% cream to wound bed prior to debridement 3. Peri-wound Care: Moisturizing lotion 4. Dressing Applied: Hydrafera Blue 5. Secondary Dressing Applied ABD Pad 7. Secured with 4-Layer Compression System - Right  Lower Extremity Notes [name], Nivedita R. ([id number]) xtrasorb Electronic Signature(s) Signed: 11/15/2017 9:20:01 AM By: Evlyn KannerBritto, Errol MD, FACS Entered By: Evlyn KannerBritto, Errol on 11/15/2017 09:20:00 [name], Lanay R. ([id number]) -------------------------------------------------------------------------------- Multi-Disciplinary Care Plan Details Patient Name: Jessica CaseyLEATH, Ileigh R. Date of Service: 11/15/2017 8:15 AM Medical Record Number: [id number] Patient Account Number: [id number] Date of Birth/Sex: November 16, 1981 (36 y.o. Female) Treating RN: Renne CriglerFlinchum, Cheryl Primary Care Linken Mcglothen: PATIENT, NO Other Clinician: Referring Yarnell Kozloski: Referral, Self Treating Samadhi Mahurin/Extender: Rudene ReBritto, Errol Weeks in Treatment: 9 Active Inactive ` Orientation to the Wound Care Program Nursing Diagnoses: Knowledge deficit related to the wound healing center program Goals: Patient/caregiver will verbalize understanding of the Wound Healing Center Program Date Initiated: 09/10/2017 Target Resolution Date: 11/23/2017 Goal Status: Active Interventions: Provide education on orientation to the wound center Notes: ` Venous Leg Ulcer Nursing Diagnoses: Knowledge deficit related to disease process and management Goals: Patient/caregiver will verbalize understanding of disease process and disease management Date Initiated: 09/10/2017 Target Resolution Date: 11/23/2017 Goal Status: Active Interventions: Assess peripheral edema status every visit. Notes: ` Wound/Skin Impairment Nursing Diagnoses: Impaired tissue integrity Goals: Ulcer/skin breakdown will heal within 14 weeks  Date Initiated: 09/10/2017 Target Resolution Date: 11/24/2017 Goal Status: Active Interventions: MARABETH, MELLAND ([id number]) Assess patient/caregiver ability to obtain necessary supplies Assess patient/caregiver ability to perform ulcer/skin care regimen upon admission and as needed Assess ulceration(s) every visit Notes: Electronic  Signature(s) Signed: 11/16/2017 2:36:38 PM By: Renne Crigler Entered By: Renne Crigler on 11/15/2017 08:53:13 Heisler, Jamesha R. ([id number]) -------------------------------------------------------------------------------- Pain Assessment Details Patient Name: Jessica Casey R. Date of Service: 11/15/2017 8:15 AM Medical Record Number: [id number] Patient Account Number: [id number] Date of Birth/Sex: 15-Sep-1981 (36 y.o. Female) Treating RN: Renne Crigler Primary Care Thaniel Coluccio: PATIENT, NO Other Clinician: Referring Arwyn Besaw: Referral, Self Treating Morganne Haile/Extender: Rudene Re in Treatment: 9 Active Problems Location of Pain Severity and Description of Pain Patient Has Paino No Site Locations Pain Management and Medication Current Pain Management: Electronic Signature(s) Signed: 11/16/2017 2:36:38 PM By: Renne Crigler Entered By: Renne Crigler on 11/15/2017 08:25:31 Koegel, Cristal Deer ([id number]) -------------------------------------------------------------------------------- Patient/Caregiver Education Details Patient Name: Jessica Casey R. Date of Service: 11/15/2017 8:15 AM Medical Record Number: [id number] Patient Account Number: [id number] Date of Birth/Gender: 1981/04/30 (36 y.o. Female) Treating RN: Renne Crigler Primary Care Physician: PATIENT, NO Other Clinician: Referring Physician: Referral, Self Treating Physician/Extender: Rudene Re in Treatment: 9 Education Assessment Education Provided To: Patient Education Topics Provided Wound/Skin Impairment: Handouts: Caring for Your Ulcer Methods: Explain/Verbal Responses: State content correctly Electronic Signature(s) Signed: 11/16/2017 2:36:38 PM By: Renne Crigler Entered By: Renne Crigler on 11/15/2017 09:15:29 Simic, Korra R. ([id number]) -------------------------------------------------------------------------------- Wound Assessment Details Patient Name: Jessica Casey R. Date of  Service: 11/15/2017 8:15 AM Medical Record Number: [id number] Patient Account Number: [id number] Date of Birth/Sex: 1981/03/10 (36 y.o. Female) Treating RN: Renne Crigler Primary Care Saman Giddens: PATIENT, NO Other Clinician: Referring Aamya Orellana: Referral, Self Treating Samantha Olivera/Extender: Rudene Re in Treatment: 9 Wound Status Wound Number: 1 Primary Etiology: Lymphedema Wound Location: Right Lower Leg - Lateral, Proximal Wound Status: Open Wounding Event: Gradually Appeared Comorbid History: Lymphedema, Hypertension Date Acquired: 08/08/2017 Weeks Of Treatment: 9 Clustered Wound: No Photos Photo Uploaded By: Renne Crigler on 11/15/2017 11:54:03 Wound Measurements Length: (cm) 0.3 Width: (cm) 0.3 Depth: (cm) 0.1 Area: (cm) 0.071 Volume: (cm) 0.007 % Reduction in Area: 95.3% % Reduction in Volume: 97.7% Epithelialization: None Tunneling: No Undermining: No Wound Description Full Thickness With Exposed Support Classification: Structures Wound Margin: Flat and Intact Exudate Large Amount: Exudate Type: Serosanguineous Exudate Color: red, brown Foul Odor After Cleansing: Yes Due to Product Use: No Slough/Fibrino Yes Wound Bed Granulation Amount: Large (67-100%) Exposed Structure Granulation Quality: Red Fascia Exposed: No Necrotic Amount: Small (1-33%) Fat Layer (Subcutaneous Tissue) Exposed: Yes Necrotic Quality: Adherent Slough Tendon Exposed: No Muscle Exposed: No Joint Exposed: No Bone Exposed: No Otter, Meeah R. ([id number]) Periwound Skin Texture Texture Color No Abnormalities Noted: No No Abnormalities Noted: No Callus: No Atrophie Blanche: No Crepitus: No Cyanosis: No Excoriation: No Ecchymosis: No Induration: No Erythema: No Rash: No Hemosiderin Staining: No Scarring: No Mottled: No Pallor: No Moisture Rubor: No No Abnormalities Noted: No Dry / Scaly: No Temperature / Pain Maceration: No Temperature: No  Abnormality Tenderness on Palpation: Yes Wound Preparation Ulcer Cleansing: Rinsed/Irrigated with Saline, Other: soap and water, Topical Anesthetic Applied: Other: lidocaine 4%, Treatment Notes Wound #1 (Right, Proximal, Lateral Lower Leg) 1. Cleansed with: Clean wound with Normal Saline 2. Anesthetic Topical Lidocaine 4% cream to wound bed prior to debridement 3. Peri-wound Care: Moisturizing lotion 4. Dressing Applied: Hydrafera Blue 5. Secondary Dressing Applied ABD Pad 7. Secured with 4-Layer Compression System - Right  Lower Extremity Notes xtrasorb Electronic Signature(s) Signed: 11/16/2017 2:36:38 PM By: Renne Crigler Entered By: Renne Crigler on 11/15/2017 08:41:05 Bondar, Tanae R. ([id number]) -------------------------------------------------------------------------------- Wound Assessment Details Patient Name: Jessica Casey R. Date of Service: 11/15/2017 8:15 AM Medical Record Number: [id number] Patient Account Number: [id number] Date of Birth/Sex: 09-28-1981 (36 y.o. Female) Treating RN: Renne Crigler Primary Care Ashleymarie Granderson: PATIENT, NO Other Clinician: Referring Karisma Meiser: Referral, Self Treating Avanni Turnbaugh/Extender: Rudene Re in Treatment: 9 Wound Status Wound Number: 2 Primary Etiology: Lymphedema Wound Location: Right Lower Leg - Lateral, Distal Wound Status: Open Wounding Event: Gradually Appeared Comorbid History: Lymphedema, Hypertension Date Acquired: 08/08/2017 Weeks Of Treatment: 9 Clustered Wound: No Photos Photo Uploaded By: Renne Crigler on 11/15/2017 11:54:03 Wound Measurements Length: (cm) 3.1 Width: (cm) 6.2 Depth: (cm) 0.2 Area: (cm) 15.095 Volume: (cm) 3.019 % Reduction in Area: 29.6% % Reduction in Volume: 53.1% Epithelialization: None Tunneling: No Undermining: No Wound Description Full Thickness With Exposed Support Classification: Structures Wound Margin: Flat and Intact Exudate Small Amount: Exudate  Type: Serosanguineous Exudate Color: red, brown Foul Odor After Cleansing: Yes Due to Product Use: No Slough/Fibrino Yes Wound Bed Granulation Amount: Medium (34-66%) Exposed Structure Granulation Quality: Red Fascia Exposed: No Necrotic Amount: Medium (34-66%) Fat Layer (Subcutaneous Tissue) Exposed: Yes Necrotic Quality: Adherent Slough Tendon Exposed: No Muscle Exposed: No Joint Exposed: No Bone Exposed: No Matheney, Kallyn R. ([id number]) Periwound Skin Texture Texture Color No Abnormalities Noted: No No Abnormalities Noted: No Callus: No Atrophie Blanche: No Crepitus: No Cyanosis: No Excoriation: No Ecchymosis: No Induration: No Erythema: No Rash: No Hemosiderin Staining: No Scarring: No Mottled: No Pallor: No Moisture Rubor: No No Abnormalities Noted: No Dry / Scaly: No Temperature / Pain Maceration: No Temperature: No Abnormality Tenderness on Palpation: Yes Wound Preparation Ulcer Cleansing: Rinsed/Irrigated with Saline, Other: soap and water, Topical Anesthetic Applied: Other: lidocaine 4%, Treatment Notes Wound #2 (Right, Distal, Lateral Lower Leg) 1. Cleansed with: Clean wound with Normal Saline 2. Anesthetic Topical Lidocaine 4% cream to wound bed prior to debridement 3. Peri-wound Care: Moisturizing lotion 4. Dressing Applied: Hydrafera Blue 5. Secondary Dressing Applied ABD Pad 7. Secured with 4-Layer Compression System - Right Lower Extremity Notes xtrasorb Electronic Signature(s) Signed: 11/16/2017 2:36:38 PM By: Renne Crigler Entered By: Renne Crigler on 11/15/2017 08:41:46 Noblett, Sanika R. ([id number]) -------------------------------------------------------------------------------- Vitals Details Patient Name: Jessica Casey R. Date of Service: 11/15/2017 8:15 AM Medical Record Number: [id number] Patient Account Number: [id number] Date of Birth/Sex: August 06, 1981 (36 y.o. Female) Treating RN: Renne Crigler Primary Care Havanna Groner:  PATIENT, NO Other Clinician: Referring Dao Mearns: Referral, Self Treating Kaydn Kumpf/Extender: Rudene Re in Treatment: 9 Vital Signs Time Taken: 08:26 Temperature (F): 98.1 Height (in): 74 Pulse (bpm): 90 Weight (lbs): 505 Respiratory Rate (breaths/min): 20 Body Mass Index (BMI): 64.8 Blood Pressure (mmHg): 133/80 Reference Range: 80 - 120 mg / dl Electronic Signature(s) Signed: 11/16/2017 2:36:38 PM By: Renne Crigler Entered By: Renne Crigler on 11/15/2017 08:28:16

## 2017-11-22 ENCOUNTER — Encounter: Payer: Self-pay | Attending: Physician Assistant | Admitting: Physician Assistant

## 2017-11-22 DIAGNOSIS — I10 Essential (primary) hypertension: Secondary | ICD-10-CM | POA: Insufficient documentation

## 2017-11-22 DIAGNOSIS — Z6841 Body Mass Index (BMI) 40.0 and over, adult: Secondary | ICD-10-CM | POA: Insufficient documentation

## 2017-11-22 DIAGNOSIS — L97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d: Secondary | ICD-10-CM | POA: Insufficient documentation

## 2017-11-22 DIAGNOSIS — I890 Lymphedema, not elsewhere classified: Secondary | ICD-10-CM | POA: Insufficient documentation

## 2017-11-22 DIAGNOSIS — I87311 Chronic venous hypertension (idiopathic) with ulcer of right lower extremity: Secondary | ICD-10-CM | POA: Insufficient documentation

## 2017-11-22 NOTE — Progress Notes (Addendum)
ROSELIND, KLUS ([id number]) Visit Report for 11/22/2017 Arrival Information Details Patient Name: [name], [name] Date of Service: 11/22/2017 8:15 AM Medical Record Number: [id number] Patient Account Number: [id number] Date of Birth/Sex: 06-Oct-1981 (37 y.o. Female) Treating RN: Renne Crigler Primary Care Bobbi Yount: PATIENT, NO Other Clinician: Referring Jacolyn Joaquin: Referral, Self Treating Genora Arp/Extender: STONE III, HOYT Weeks in Treatment: 10 Visit Information History Since Last Visit All ordered tests and consults were completed: No Patient Arrived: Ambulatory Added or deleted any medications: No Arrival Time: 08:16 Any new allergies or adverse reactions: No Accompanied By: self Had a fall or experienced change in No Transfer Assistance: None activities of daily living that may affect Patient Identification Verified: Yes risk of falls: Secondary Verification Process Completed: Yes Signs or symptoms of abuse/neglect since last visito No Patient Requires Transmission-Based No Hospitalized since last visit: No Precautions: Pain Present Now: Yes Patient Has Alerts: No Electronic Signature(s) Signed: 11/22/2017 8:52:32 AM By: Renne Crigler Entered By: Renne Crigler on 11/22/2017 08:17:07 Reddick, Felicitas R. ([id number]) -------------------------------------------------------------------------------- Clinic Level of Care Assessment Details Patient Name: Jessica Casey R. Date of Service: 11/22/2017 8:15 AM Medical Record Number: [id number] Patient Account Number: [id number] Date of Birth/Sex: 10/31/1981 (37 y.o. Female) Treating RN: Renne Crigler Primary Care Jamilet Ambroise: PATIENT, NO Other Clinician: Referring Marvia Troost: Referral, Self Treating Kennedi Lizardo/Extender: STONE III, HOYT Weeks in Treatment: 10 Clinic Level of Care Assessment Items TOOL 4 Quantity Score []  - Use when only an EandM is performed on FOLLOW-UP visit 0 ASSESSMENTS - Nursing Assessment / Reassessment []  -  Reassessment of Co-morbidities (includes updates in patient status) 0 X- 1 5 Reassessment of Adherence to Treatment Plan ASSESSMENTS - Wound and Skin Assessment / Reassessment X - Simple Wound Assessment / Reassessment - one wound 1 5 []  - 0 Complex Wound Assessment / Reassessment - multiple wounds []  - 0 Dermatologic / Skin Assessment (not related to wound area) ASSESSMENTS - Focused Assessment []  - Circumferential Edema Measurements - multi extremities 0 []  - 0 Nutritional Assessment / Counseling / Intervention []  - 0 Lower Extremity Assessment (monofilament, tuning fork, pulses) []  - 0 Peripheral Arterial Disease Assessment (using hand held doppler) ASSESSMENTS - Ostomy and/or Continence Assessment and Care []  - Incontinence Assessment and Management 0 []  - 0 Ostomy Care Assessment and Management (repouching, etc.) PROCESS - Coordination of Care X - Simple Patient / Family Education for ongoing care 1 15 []  - 0 Complex (extensive) Patient / Family Education for ongoing care []  - 0 Staff obtains Chiropractor, Records, Test Results / Process Orders []  - 0 Staff telephones HHA, Nursing Homes / Clarify orders / etc []  - 0 Routine Transfer to another Facility (non-emergent condition) []  - 0 Routine Hospital Admission (non-emergent condition) []  - 0 New Admissions / Manufacturing engineer / Ordering NPWT, Apligraf, etc. []  - 0 Emergency Hospital Admission (emergent condition) X- 1 10 Simple Discharge Coordination Booze, Paige R. ([id number]) []  - 0 Complex (extensive) Discharge Coordination PROCESS - Special Needs []  - Pediatric / Minor Patient Management 0 []  - 0 Isolation Patient Management []  - 0 Hearing / Language / Visual special needs []  - 0 Assessment of Community assistance (transportation, D/C planning, etc.) []  - 0 Additional assistance / Altered mentation []  - 0 Support Surface(s) Assessment (bed, cushion, seat, etc.) INTERVENTIONS - Wound Cleansing /  Measurement X - Simple Wound Cleansing - one wound 1 5 []  - 0 Complex Wound Cleansing - multiple wounds []  - 0 Wound Imaging (photographs - any number of wounds) []  - 0 Wound  Tracing (instead of photographs) X- 1 5 Simple Wound Measurement - one wound []  - 0 Complex Wound Measurement - multiple wounds INTERVENTIONS - Wound Dressings []  - Small Wound Dressing one or multiple wounds 0 X- 1 15 Medium Wound Dressing one or multiple wounds []  - 0 Large Wound Dressing one or multiple wounds []  - 0 Application of Medications - topical []  - 0 Application of Medications - injection INTERVENTIONS - Miscellaneous []  - External ear exam 0 []  - 0 Specimen Collection (cultures, biopsies, blood, body fluids, etc.) []  - 0 Specimen(s) / Culture(s) sent or taken to Lab for analysis []  - 0 Patient Transfer (multiple staff / Nurse, adult / Similar devices) []  - 0 Simple Staple / Suture removal (25 or less) []  - 0 Complex Staple / Suture removal (26 or more) []  - 0 Hypo / Hyperglycemic Management (close monitor of Blood Glucose) []  - 0 Ankle / Brachial Index (ABI) - do not check if billed separately X- 1 5 Vital Signs Hanratty, Dashawn R. ([id number]) Has the patient been seen at the hospital within the last three years: Yes Total Score: 65 Level Of Care: New/Established - Level 2 Electronic Signature(s) Signed: 11/22/2017 5:09:14 PM By: Renne Crigler Entered By: Renne Crigler on 11/22/2017 09:23:39 Mclure, Cristal Deer ([id number]) -------------------------------------------------------------------------------- Encounter Discharge Information Details Patient Name: Jessica Casey R. Date of Service: 11/22/2017 8:15 AM Medical Record Number: [id number] Patient Account Number: [id number] Date of Birth/Sex: [birth year] (37 y.o. Female) Treating RN: Renne Crigler Primary Care Xochitl Egle: PATIENT, NO Other Clinician: Referring Shaylen Nephew: Referral, Self Treating Sakina Briones/Extender: STONE III, HOYT Weeks  in Treatment: 10 Encounter Discharge Information Items Discharge Pain Level: 0 Discharge Condition: Stable Ambulatory Status: Ambulatory Discharge Destination: Home Transportation: Private Auto Accompanied By: self Schedule Follow-up Appointment: Yes Medication Reconciliation completed and No provided to Patient/Care Cloyce Blankenhorn: Provided on Clinical Summary of Care: 11/22/2017 Form Type Recipient Paper Patient EL Electronic Signature(s) Signed: 11/22/2017 5:09:14 PM By: Renne Crigler Entered By: Renne Crigler on 11/22/2017 09:25:13 Rolison, Isolde R. ([id number]) -------------------------------------------------------------------------------- Lower Extremity Assessment Details Patient Name: Jessica Casey R. Date of Service: 11/22/2017 8:15 AM Medical Record Number: [id number] Patient Account Number: [id number] Date of Birth/Sex: [birth year] (37 y.o. Female) Treating RN: Renne Crigler Primary Care Kooper Chriswell: PATIENT, NO Other Clinician: Referring Patty Leitzke: Referral, Self Treating Solyana Nonaka/Extender: STONE III, HOYT Weeks in Treatment: 10 Edema Assessment Assessed: [Left: No] [Right: No] [Left: Edema] [Right: :] Calf Left: Right: Point of Measurement: 35 cm From Medial Instep cm 61.2 cm Ankle Left: Right: Point of Measurement: 9 cm From Medial Instep cm 39 cm Vascular Assessment Claudication: Claudication Assessment [Right:None] Pulses: Dorsalis Pedis Palpable: [Right:Yes] Posterior Tibial Extremity colors, hair growth, and conditions: Extremity Color: [Right:Normal] Hair Growth on Extremity: [Right:Yes] Temperature of Extremity: [Right:Cold] Capillary Refill: [Right:< 3 seconds] Toe Nail Assessment Left: Right: Thick: Yes Discolored: Yes Deformed: Yes Improper Length and Hygiene: Yes Electronic Signature(s) Signed: 11/22/2017 8:52:32 AM By: Renne Crigler Entered By: Renne Crigler on 11/22/2017 08:32:53 Reamy, Lei R.  ([id number]) -------------------------------------------------------------------------------- Multi Wound Chart Details Patient Name: Jessica Casey R. Date of Service: 11/22/2017 8:15 AM Medical Record Number: [id number] Patient Account Number: [id number] Date of Birth/Sex: 05-20-1981 (37 y.o. Female) Treating RN: Renne Crigler Primary Care Ediberto Sens: PATIENT, NO Other Clinician: Referring Chloeanne Poteet: Referral, Self Treating Camdin Hegner/Extender: STONE III, HOYT Weeks in Treatment: 10 Vital Signs Height(in): 74 Pulse(bpm): 92 Weight(lbs): 505 Blood Pressure(mmHg): 146/93 Body Mass Index(BMI): 65 Temperature(F): 98.4 Respiratory Rate 18 (breaths/min): Photos: [2:No Photos] [N/A:N/A] Wound Location: [2:Right Lower Leg - Lateral, Distal] [  N/A:N/A] Wounding Event: [2:Gradually Appeared] [N/A:N/A] Primary Etiology: [2:Lymphedema] [N/A:N/A] Comorbid History: [2:Lymphedema, Hypertension] [N/A:N/A] Date Acquired: [2:08/08/2017] [N/A:N/A] Weeks of Treatment: [2:10] [N/A:N/A] Wound Status: [2:Open] [N/A:N/A] Measurements L x W x D [2:3.5x4.5x0.2] [N/A:N/A] (cm) Area (cm) : [2:12.37] [N/A:N/A] Volume (cm) : [2:2.474] [N/A:N/A] % Reduction in Area: [2:42.30%] [N/A:N/A] % Reduction in Volume: [2:61.50%] [N/A:N/A] Classification: [2:Full Thickness With Exposed Support Structures] [N/A:N/A] Exudate Amount: [2:Large] [N/A:N/A] Exudate Type: [2:Serosanguineous] [N/A:N/A] Exudate Color: [2:red, brown] [N/A:N/A] Wound Margin: [2:Flat and Intact] [N/A:N/A] Granulation Amount: [2:Medium (34-66%)] [N/A:N/A] Granulation Quality: [2:Red] [N/A:N/A] Necrotic Amount: [2:Medium (34-66%)] [N/A:N/A] Exposed Structures: [2:Fat Layer (Subcutaneous Tissue) Exposed: Yes Fascia: No Tendon: No Muscle: No Joint: No Bone: No] [N/A:N/A] Epithelialization: [2:None] [N/A:N/A] Periwound Skin Texture: [2:Excoriation: No Induration: No Callus: No Crepitus: No] [N/A:N/A] Rash: No Scarring: No Periwound Skin  Moisture: Maceration: No N/A N/A Dry/Scaly: No Periwound Skin Color: Atrophie Blanche: No N/A N/A Cyanosis: No Ecchymosis: No Erythema: No Hemosiderin Staining: No Mottled: No Pallor: No Rubor: No Temperature: No Abnormality N/A N/A Tenderness on Palpation: Yes N/A N/A Wound Preparation: Ulcer Cleansing: N/A N/A Rinsed/Irrigated with Saline, Other: soap and water Topical Anesthetic Applied: Other: lidocaine 4% Treatment Notes Electronic Signature(s) Signed: 11/22/2017 8:52:32 AM By: Renne CriglerFlinchum, Cheryl Entered By: Renne CriglerFlinchum, Cheryl on 11/22/2017 08:33:22 Marlaine HindLEATH, Verba R. ([id number]) -------------------------------------------------------------------------------- Multi-Disciplinary Care Plan Details Patient Name: Jessica CaseyLEATH, Nadiyah R. Date of Service: 11/22/2017 8:15 AM Medical Record Number: [id number] Patient Account Number: [id number] Date of Birth/Sex: 09/15/1981 (37 y.o. Female) Treating RN: Renne CriglerFlinchum, Cheryl Primary Care Horace Lukas: PATIENT, NO Other Clinician: Referring Rashidah [place]: Referral, Self Treating Armandina Iman/Extender: STONE III, HOYT Weeks in Treatment: 10 Active Inactive ` Orientation to the Wound Care Program Nursing Diagnoses: Knowledge deficit related to the wound healing center program Goals: Patient/caregiver will verbalize understanding of the Wound Healing Center Program Date Initiated: 09/10/2017 Target Resolution Date: 11/23/2017 Goal Status: Active Interventions: Provide education on orientation to the wound center Notes: ` Venous Leg Ulcer Nursing Diagnoses: Knowledge deficit related to disease process and management Goals: Patient/caregiver will verbalize understanding of disease process and disease management Date Initiated: 09/10/2017 Target Resolution Date: 11/23/2017 Goal Status: Active Interventions: Assess peripheral edema status every visit. Notes: ` Wound/Skin Impairment Nursing Diagnoses: Impaired tissue integrity Goals: Ulcer/skin breakdown  will heal within 14 weeks Date Initiated: 09/10/2017 Target Resolution Date: 11/24/2017 Goal Status: Active Interventions: Marlaine HindLEATH, Gladyce R. ([id number]) Assess patient/caregiver ability to obtain necessary supplies Assess patient/caregiver ability to perform ulcer/skin care regimen upon admission and as needed Assess ulceration(s) every visit Notes: Electronic Signature(s) Signed: 11/22/2017 8:52:32 AM By: Renne CriglerFlinchum, Cheryl Entered By: Renne CriglerFlinchum, Cheryl on 11/22/2017 08:33:11 Counihan, Imanie R. ([id number]) -------------------------------------------------------------------------------- Pain Assessment Details Patient Name: Jessica CaseyLEATH, Oluwadarasimi R. Date of Service: 11/22/2017 8:15 AM Medical Record Number: [id number] Patient Account Number: [id number] Date of Birth/Sex: 09/15/1981 (37 y.o. Female) Treating RN: Renne CriglerFlinchum, Cheryl Primary Care Kayline Sheer: PATIENT, NO Other Clinician: Referring Sylvi Rybolt: Referral, Self Treating Esterlene Atiyeh/Extender: STONE III, HOYT Weeks in Treatment: 10 Active Problems Location of Pain Severity and Description of Pain Patient Has Paino Yes Site Locations Pain Location: Pain in Ulcers Duration of the Pain. Constant / Intermittento Intermittent Rate the pain. Current Pain Level: 3 Character of Pain Describe the Pain: Burning Pain Management and Medication Current Pain Management: Electronic Signature(s) Signed: 11/22/2017 8:52:32 AM By: Renne CriglerFlinchum, Cheryl Entered By: Renne CriglerFlinchum, Cheryl on 11/22/2017 08:17:32 Gidley, Cristal DeerERICA R. ([id number]) -------------------------------------------------------------------------------- Patient/Caregiver Education Details Patient Name: Jessica CaseyLEATH, Undine R. Date of Service: 11/22/2017 8:15 AM Medical Record Number: [id number] Patient Account Number: [id number] Date of Birth/Gender: 09/15/1981 (  37 y.o. Female) Treating RN: Renne Crigler Primary Care Physician: PATIENT, NO Other Clinician: Referring Physician: Referral, Self Treating Physician/Extender:  Linwood Dibbles, HOYT Weeks in Treatment: 10 Education Assessment Education Provided To: Patient Education Topics Provided Wound/Skin Impairment: Handouts: Caring for Your Ulcer Methods: Explain/Verbal Responses: State content correctly Electronic Signature(s) Signed: 11/22/2017 5:09:14 PM By: Renne Crigler Entered By: Renne Crigler on 11/22/2017 09:25:22 Gong, Azara R. ([id number]) -------------------------------------------------------------------------------- Wound Assessment Details Patient Name: Jessica Casey R. Date of Service: 11/22/2017 8:15 AM Medical Record Number: [id number] Patient Account Number: [id number] Date of Birth/Sex: 1981-10-03 (37 y.o. Female) Treating RN: Renne Crigler Primary Care Leslie Langille: PATIENT, NO Other Clinician: Referring Rayleigh Gillyard: Referral, Self Treating Kipp Shank/Extender: STONE III, HOYT Weeks in Treatment: 10 Wound Status Wound Number: 2 Primary Etiology: Lymphedema Wound Location: Right Lower Leg - Lateral, Distal Wound Status: Open Wounding Event: Gradually Appeared Comorbid History: Lymphedema, Hypertension Date Acquired: 08/08/2017 Weeks Of Treatment: 10 Clustered Wound: No Photos Photo Uploaded By: Renne Crigler on 11/22/2017 14:31:04 Wound Measurements Length: (cm) 3.5 Width: (cm) 4.5 Depth: (cm) 0.2 Area: (cm) 12.37 Volume: (cm) 2.474 % Reduction in Area: 42.3% % Reduction in Volume: 61.5% Epithelialization: None Tunneling: No Undermining: No Wound Description Full Thickness With Exposed Support Classification: Structures Wound Margin: Flat and Intact Exudate Large Amount: Exudate Type: Serosanguineous Exudate Color: red, brown Foul Odor After Cleansing: No Slough/Fibrino Yes Wound Bed Granulation Amount: Medium (34-66%) Exposed Structure Granulation Quality: Red Fascia Exposed: No Necrotic Amount: Medium (34-66%) Fat Layer (Subcutaneous Tissue) Exposed: Yes Necrotic Quality: Adherent Slough Tendon Exposed:  No Muscle Exposed: No Joint Exposed: No Bone Exposed: No [place], Lakeena R. ([id number]) Periwound Skin Texture Texture Color No Abnormalities Noted: No No Abnormalities Noted: No Callus: No Atrophie Blanche: No Crepitus: No Cyanosis: No Excoriation: No Ecchymosis: No Induration: No Erythema: No Rash: No Hemosiderin Staining: No Scarring: No Mottled: No Pallor: No Moisture Rubor: No No Abnormalities Noted: No Dry / Scaly: No Temperature / Pain Maceration: No Temperature: No Abnormality Tenderness on Palpation: Yes Wound Preparation Ulcer Cleansing: Rinsed/Irrigated with Saline, Other: soap and water, Topical Anesthetic Applied: Other: lidocaine 4%, Treatment Notes Wound #2 (Right, Distal, Lateral Lower Leg) 1. Cleansed with: Clean wound with Normal Saline 2. Anesthetic Hurricaine Topical Anesthetic Spray 3. Peri-wound Care: Barrier cream 4. Dressing Applied: Hydrafera Blue 5. Secondary Dressing Applied ABD Pad 7. Secured with 4-Layer Compression System - Right Lower Extremity Unna Boot to Right Lower Extremity Notes xtrasorb, charcoal Electronic Signature(s) Signed: 11/22/2017 8:52:32 AM By: Renne Crigler Entered By: Renne Crigler on 11/22/2017 08:30:42 Weissmann, Dennice RMarland Kitchen ([id number]) -------------------------------------------------------------------------------- Vitals Details Patient Name: Jessica Casey R. Date of Service: 11/22/2017 8:15 AM Medical Record Number: [id number] Patient Account Number: [id number] Date of Birth/Sex: 05-06-1981 (37 y.o. Female) Treating RN: Renne Crigler Primary Care Loye Vento: PATIENT, NO Other Clinician: Referring Joakim Huesman: Referral, Self Treating Kennede Lusk/Extender: STONE III, HOYT Weeks in Treatment: 10 Vital Signs Time Taken: 08:18 Temperature (F): 98.4 Height (in): 74 Pulse (bpm): 92 Weight (lbs): 505 Respiratory Rate (breaths/min): 18 Body Mass Index (BMI): 64.8 Blood Pressure (mmHg): 146/93 Reference Range: 80  - 120 mg / dl Electronic Signature(s) Signed: 11/22/2017 8:52:32 AM By: Renne Crigler Entered By: Renne Crigler on 11/22/2017 08:19:19

## 2017-11-22 NOTE — Progress Notes (Signed)
[name], [name] ([id number]) Visit Report for 11/19/2017 Arrival Information Details Patient Name: [name], [name] Date of Service: 11/19/2017 2:15 PM Medical Record Number: [id number] Patient Account Number: [id number] Date of Birth/Sex: 17-Apr-1981 (37 y.o. Female) Treating RN: Ashok CordiaPinkerton, Debi Primary Care Rebbecca Osuna: PATIENT, NO Other Clinician: Referring Dyan Creelman: Referral, Self Treating Shalonda Sachse/Extender: STONE III, HOYT Weeks in Treatment: 10 Visit Information History Since Last Visit All ordered tests and consults were completed: No Patient Arrived: Ambulatory Added or deleted any medications: No Arrival Time: 14:25 Any new allergies or adverse reactions: No Accompanied By: self Had a fall or experienced change in No Transfer Assistance: None activities of daily living that may affect Patient Identification Verified: Yes risk of falls: Secondary Verification Process Completed: Yes Signs or symptoms of abuse/neglect since last visito No Patient Requires Transmission-Based No Hospitalized since last visit: No Precautions: Has Dressing in Place as Prescribed: Yes Patient Has Alerts: No Has Compression in Place as Prescribed: Yes Pain Present Now: No Electronic Signature(s) Signed: 11/21/2017 5:16:05 PM By: Alejandro MullingPinkerton, Debra Entered By: Alejandro MullingPinkerton, Debra on 11/19/2017 14:25:55 Newmark, [name] ([id number]) -------------------------------------------------------------------------------- Encounter Discharge Information Details Patient Name: [name], [name] Date of Service: 11/19/2017 2:15 PM Medical Record Number: [id number] Patient Account Number: [id number] Date of Birth/Sex: 17-Apr-1981 (37 y.o. Female) Treating RN: Ashok CordiaPinkerton, Debi Primary Care Braxston Quinter: PATIENT, NO Other Clinician: Referring Ilena Dieckman: Referral, Self Treating Ariba Lehnen/Extender: STONE III, HOYT Weeks in Treatment: 10 Encounter Discharge Information Items Discharge Pain Level: 0 Discharge Condition:  Stable Ambulatory Status: Ambulatory Discharge Destination: Home Private Transportation: Auto Accompanied By: self Schedule Follow-up Appointment: Yes Medication Reconciliation completed and No provided to Patient/Care Kayd Launer: Clinical Summary of Care: Electronic Signature(s) Signed: 11/21/2017 5:16:05 PM By: Alejandro MullingPinkerton, Debra Entered By: Alejandro MullingPinkerton, Debra on 11/19/2017 14:27:34 [name], [name] ([id number]) -------------------------------------------------------------------------------- Patient/Caregiver Education Details Patient Name: [name], [name] Date of Service: 11/19/2017 2:15 PM Medical Record Number: [id number] Patient Account Number: [id number] Date of Birth/Gender: 17-Apr-1981 (37 y.o. Female) Treating RN: Ashok CordiaPinkerton, Debi Primary Care Physician: PATIENT, NO Other Clinician: Referring Physician: Referral, Self Treating Physician/Extender: Linwood DibblesSTONE III, HOYT Weeks in Treatment: 10 Education Assessment Education Provided To: Patient Education Topics Provided Wound/Skin Impairment: Handouts: Caring for Your Ulcer, Other: do not get wrap wet Methods: Demonstration, Explain/Verbal Responses: State content correctly Electronic Signature(s) Signed: 11/21/2017 5:16:05 PM By: Alejandro MullingPinkerton, Debra Entered By: Alejandro MullingPinkerton, Debra on 11/19/2017 14:27:24 [name], [name] ([id number]) -------------------------------------------------------------------------------- Wound Assessment Details Patient Name: [name], [name] Date of Service: 11/19/2017 2:15 PM Medical Record Number: [id number] Patient Account Number: [id number] Date of Birth/Sex: 17-Apr-1981 (37 y.o. Female) Treating RN: Ashok CordiaPinkerton, Debi Primary Care Lavalle Skoda: PATIENT, NO Other Clinician: Referring Aviah Sorci: Referral, Self Treating Keslie Gritz/Extender: STONE III, HOYT Weeks in Treatment: 10 Wound Status Wound Number: 1 Primary Etiology: Lymphedema Wound Location: Right Lower Leg - Lateral, Proximal Wound Status: Healed -  Epithelialized Wounding Event: Gradually Appeared Comorbid History: Lymphedema, Hypertension Date Acquired: 08/08/2017 Weeks Of Treatment: 10 Clustered Wound: No Photos Photo Uploaded By: Alejandro MullingPinkerton, Debra on 11/19/2017 15:53:27 Wound Measurements Length: (cm) 0 % Redu Width: (cm) 0 % Redu Depth: (cm) 0 Epithe Area: (cm) 0 Tunne Volume: (cm) 0 Under ction in Area: 100% ction in Volume: 100% lialization: Large (67-100%) ling: No mining: No Wound Description Full Thickness With Exposed Support Classification: Structures Wound Margin: Flat and Intact Exudate None Present Amount: Foul Odor After Cleansing: No Slough/Fibrino No Wound Bed Granulation Amount: None Present (0%) Exposed Structure Necrotic Amount: None Present (0%) Fascia Exposed: No Fat Layer (Subcutaneous Tissue) Exposed: No Tendon Exposed: No  Muscle Exposed: No Joint Exposed: No Bone Exposed: No Periwound Skin Texture Texture Color [name], [name] ([id number]) No Abnormalities Noted: No No Abnormalities Noted: No Callus: No Atrophie Blanche: No Crepitus: No Cyanosis: No Excoriation: No Ecchymosis: No Induration: No Erythema: No Rash: No Hemosiderin Staining: No Scarring: No Mottled: No Pallor: No Moisture Rubor: No No Abnormalities Noted: No Dry / Scaly: No Temperature / Pain Maceration: No Temperature: No Abnormality Tenderness on Palpation: Yes Wound Preparation Ulcer Cleansing: Rinsed/Irrigated with Saline, Other: soap and water, Topical Anesthetic Applied: None Treatment Notes Wound #1 (Right, Proximal, Lateral Lower Leg) 1. Cleansed with: Clean wound with Normal Saline Cleanse wound with antibacterial soap and water 3. Peri-wound Care: Barrier cream Moisturizing lotion 4. Dressing Applied: Hydrafera Blue 5. Secondary Dressing Applied ABD Pad 7. Secured with Tape 4-Layer Compression System - Right Lower Extremity Notes xtrasorb, charcoal Electronic  Signature(s) Signed: 11/21/2017 5:16:05 PM By: [name] Entered By: [name] on 11/19/2017 14:32:56 [name], [name] ([id number]) -------------------------------------------------------------------------------- Wound Assessment Details Patient Name: Jessica Casey R. Date of Service: 11/19/2017 2:15 PM Medical Record Number: [id number] Patient Account Number: [id number] Date of Birth/Sex: Dec 23, 1980 (37 y.o. Female) Treating RN: Ashok Cordia, Debi Primary Care Rannie Craney: PATIENT, NO Other Clinician: Referring Shaquaya Wuellner: Referral, Self Treating Jaedan Huttner/Extender: STONE III, HOYT Weeks in Treatment: 10 Wound Status Wound Number: 2 Primary Etiology: Lymphedema Wound Location: Right, Distal, Lateral Lower Leg Wound Status: Open Wounding Event: Gradually Appeared Comorbid History: Lymphedema, Hypertension Date Acquired: 08/08/2017 Weeks Of Treatment: 10 Clustered Wound: No Photos Photo Uploaded By: [name] on 11/19/2017 15:53:39 Wound Measurements Length: (cm) 3.1 Width: (cm) 6.5 Depth: (cm) 0.2 Area: (cm) 15.826 Volume: (cm) 3.165 % Reduction in Area: 26.2% % Reduction in Volume: 50.8% Epithelialization: None Tunneling: No Undermining: No Wound Description Full Thickness With Exposed Support Classification: Structures Wound Margin: Flat and Intact Exudate Large Amount: Exudate Type: Serosanguineous Exudate Color: red, brown Foul Odor After Cleansing: Yes Due to Product Use: No Slough/Fibrino Yes Wound Bed Granulation Amount: Medium (34-66%) Exposed Structure Granulation Quality: Red Fascia Exposed: No Necrotic Amount: Medium (34-66%) Fat Layer (Subcutaneous Tissue) Exposed: Yes Necrotic Quality: Adherent Slough Tendon Exposed: No Muscle Exposed: No Joint Exposed: No Bone Exposed: No [name], [name] ([id number]) Periwound Skin Texture Texture Color No Abnormalities Noted: No No Abnormalities Noted: No Callus: No Atrophie Blanche:  No Crepitus: No Cyanosis: No Excoriation: No Ecchymosis: No Induration: No Erythema: No Rash: No Hemosiderin Staining: No Scarring: No Mottled: No Pallor: No Moisture Rubor: No No Abnormalities Noted: No Dry / Scaly: No Temperature / Pain Maceration: No Temperature: No Abnormality Tenderness on Palpation: Yes Wound Preparation Ulcer Cleansing: Rinsed/Irrigated with Saline, Other: soap and water, Topical Anesthetic Applied: None Treatment Notes Wound #2 (Right, Distal, Lateral Lower Leg) 1. Cleansed with: Clean wound with Normal Saline Cleanse wound with antibacterial soap and water 3. Peri-wound Care: Barrier cream Moisturizing lotion 4. Dressing Applied: Hydrafera Blue 5. Secondary Dressing Applied ABD Pad 7. Secured with Tape 4-Layer Compression System - Right Lower Extremity Notes xtrasorb, charcoal Electronic Signature(s) Signed: 11/21/2017 5:16:05 PM By: [name] Entered By: [name] on 11/19/2017 14:34:13

## 2017-11-23 NOTE — Progress Notes (Signed)
Jessica, [name] ([id number]) Visit Report for 11/22/2017 Chief Complaint Document Details Patient Name: [name], [name] Date of Service: 11/22/2017 8:15 AM Medical Record Number: [id number] Patient Account Number: [id number] Date of Birth/Sex: 10/27/1981 (37 y.o. Female) Treating RN: Renne Crigler Primary Care Provider: PATIENT, NO Other Clinician: Referring Provider: Referral, Self Treating Provider/Extender: STONE III, Yareth Kearse Weeks in Treatment: 10 Information Obtained from: Patient Chief Complaint She is here in follow up evaluation of right lower extremity ulcers Electronic Signature(s) Signed: 11/22/2017 6:11:24 PM By: Lenda Kelp PA-C Entered By: Lenda Kelp on 11/22/2017 08:54:57 Niemeier, Kestrel R. ([id number]) -------------------------------------------------------------------------------- HPI Details Patient Name: [name] Date of Service: 11/22/2017 8:15 AM Medical Record Number: [id number] Patient Account Number: [id number] Date of Birth/Sex: 1981-10-20 (37 y.o. Female) Treating RN: Renne Crigler Primary Care Provider: PATIENT, NO Other Clinician: Referring Provider: Referral, Self Treating Provider/Extender: STONE III, Elmus Mathes Weeks in Treatment: 10 History of Present Illness Location: right lower extremity lateral calf several large areas Quality: Patient reports experiencing a sharp pain to affected area(s). Severity: Patient states wound are getting worse. Duration: Patient has had the wound for < 5 weeks prior to presenting for treatment Timing: Pain in wound is Intermittent (comes and goes Context: The wound would happen gradually Modifying Factors: Other treatment(s) tried include:admission to the hospital for sepsis and a full workup with treatment with IV antibiotics and discharged on oral antibiotics Associated Signs and Symptoms: Patient reports having increase discharge. HPI Description: 37 year old patient well known to our [facility] wound care clinic  where she has been seen since 2016 for bilateral lower extremity venous insufficiency disease with lymphedema and multiple ulcerations associated with morbid obesity. she had custom-made compression stockings and lymphedema pumps which were used in the past. most recently she was admitted to the hospital between October 11 and 09/02/2017 with sepsis, lower extremity wounds and lymphedema.she was initially treated in the outpatient with Keflex and Bactrim. she was initially treated in the hospital with vancomycin and Zosyn and changed over to Unasyn until her white count improved and her blood cultures were negative for 3 days. After her inpatient management she was discharged home on Augmentin to end on 09/13/2017 with a 14 day course. she has had outpatient vascular duplex scans completed in November 2017 and her right ABI was 1.1 and the left ABI is 1.3. she had normal toe brachial indices bilaterally.she had three-vessel runoff in the right lower extremity and two-vessel runoff in the left lower extremity. On questioning the patient she does have custom made compression stockings and also has a lymphedema pump but has not been using it appropriately and has not been taking good care of herself. 09/17/2017 -- she returns today with compression stockings on the left side and the right side has had significant amount of drainage and has a very strong odor 09/24/2017 -- the drainage is increased significantly and she has more lymphedema and a very strong odor to her wound. Though she does not have systemic symptoms, or overt infection I believe she will benefit from some doxycycline given empirically. 10/01/2017 -- after starting the doxycycline and changing the dressing twice a week her symptoms and signs have definitely improved overall. 10/08/2017 -- she has completed her course of doxycycline but continues to have a lot of drainage and needs twice a week dressing changes. 11/08/17-she is  here in follow-up evaluation for right lower extremity ulcers. She admits to using her lymphedema pumps twice daily, one hour per session. she is voicing no  complaints or concerns, no signs of infection [facility] on evaluation today patient presents for reevaluation concerning her wound over the right lateral lower extremity secondary to lymphedema. She has been tolerating the dressing changes which is good news. Fortunately she continues to use her lymphedema pumps as well which I think is beneficial for her. We did switch to Prisma several weeks ago which I think has been a good change for her. Patient is not having any significant pain at this point. Electronic Signature(s) Signed: 11/22/2017 6:11:24 PM By: Josephina ShihStone III, Abdo Denault PA-C Sickles, Jaeden R. ([id number]) Entered By: Lenda KelpStone III, Lorel Lembo on 11/22/2017 09:36:14 Jessica HindLEATH, Mayre R. ([id number]) -------------------------------------------------------------------------------- Physical Exam Details Patient Name: Jessica CaseyLEATH, Jorja R. Date of Service: 11/22/2017 8:15 AM Medical Record Number: [id number] Patient Account Number: [id number] Date of Birth/Sex: [birth year] (37 y.o. Female) Treating RN: Renne CriglerFlinchum, Cheryl Primary Care Provider: PATIENT, NO Other Clinician: Referring Provider: Referral, Self Treating Provider/Extender: STONE III, Cato Liburd Weeks in Treatment: 10 Constitutional Obese and well-hydrated in no acute distress. Respiratory normal breathing without difficulty. clear to auscultation bilaterally. Cardiovascular 1+ pitting edema of the bilateral lower extremities. Psychiatric this patient is able to make decisions and demonstrates good insight into disease process. Alert and Oriented x 3. pleasant and cooperative. Notes Patient has no slough covering her wound bed at this point. She does have granulation although this granulation does appear to be hyper granular in nature. Electronic Signature(s) Signed: 11/22/2017 6:11:24 PM By:  Lenda KelpStone III, Jewelz Kobus PA-C Entered By: Lenda KelpStone III, Gloria Lambertson on 11/22/2017 09:37:11 Burgoon, Cristal DeerERICA R. ([id number]) -------------------------------------------------------------------------------- Physician Orders Details Patient Name: Jessica CaseyLEATH, Brighid R. Date of Service: 11/22/2017 8:15 AM Medical Record Number: [id number] Patient Account Number: [id number] Date of Birth/Sex: [birth year] (37 y.o. 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 o Cleanse wound with mild soap and water Anesthetic (add to Medication List) Wound #2 Right,Distal,Lateral Lower Leg o Topical Lidocaine 4% cream applied to wound bed prior to debridement (In Clinic Only). Skin Barriers/Peri-Wound Care Wound #2 Right,Distal,Lateral Lower Leg o Barrier cream o Moisturizing lotion Primary Wound Dressing Wound #2 Right,Distal,Lateral Lower Leg o Hydrafera Blue Secondary Dressing Wound #2 Right,Distal,Lateral Lower Leg o ABD pad o XtraSorb o Other - charcoal Dressing Change Frequency Wound #2 Right,Distal,Lateral Lower Leg o Dressing is to be changed Monday and Thursday. Follow-up Appointments o Return Appointment in 1 week. o Nurse Visit as needed Edema Control Wound #2 Right,Distal,Lateral Lower Leg Gardella, Eara R. ([id number]) o 4-Layer Compression System - Right Lower Extremity - unna to anchor o Elevate legs to the level of the heart and  pump ankles as often as possible Additional Orders / Instructions Wound #2 Right,Distal,Lateral Lower Leg o Vitamin A; Vitamin C, Zinc o Increase protein intake. o Other: - Please watch your salt (sodium) intake Electronic Signature(s) Signed: 11/22/2017 5:09:14 PM By: Renne CriglerFlinchum, Cheryl Signed: 11/22/2017 6:11:24 PM By: Lenda KelpStone III, Sherill Wegener PA-C Entered By: Renne CriglerFlinchum, Cheryl on 11/22/2017 09:22:48 Jessica HindLEATH, Shyasia R. ([id number]) -------------------------------------------------------------------------------- Problem List Details Patient Name: Jessica CaseyLEATH, Freda R. Date of Service: 11/22/2017 8:15 AM Medical Record Number:  [id number] Patient Account Number: [id number] Date of Birth/Sex: [birth year] (36 y.o. Female) Treating RN: Renne Crigler Primary Care Provider: PATIENT, NO Other Clinician: Referring Provider: Referral, Self Treating Provider/Extender: STONE III, Merideth Bosque Weeks in Treatment: 10 Active Problems ICD-10 Encounter Code Description Active Date Diagnosis L97.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d 09/10/2017 Yes I89.0 Lymphedema, not elsewhere classified 09/10/2017 Yes I87.311 Chronic venous hypertension (idiopathic) with ulcer of right lower 09/10/2017 Yes extremity E66.01 Morbid (severe) obesity due to excess calories 09/10/2017 Yes Inactive Problems Resolved Problems Electronic Signature(s) Signed: 11/22/2017 6:11:24 PM By: Lenda Kelp PA-C Entered By: Lenda Kelp on 11/22/2017 08:54:49 Wimberley, Merissa R. ([id number]) -------------------------------------------------------------------------------- Progress Note Details Patient Name: [name] R. Date of Service: 11/22/2017 8:15 AM Medical Record Number: [id number] Patient Account Number: [id number] Date of Birth/Sex: 10-Feb-1981 (37 y.o. Female) Treating RN: Renne Crigler Primary Care Provider: PATIENT, NO Other Clinician: Referring Provider: Referral, Self Treating Provider/Extender: STONE III,  Caeleb Batalla Weeks in Treatment: 10 Subjective Chief Complaint Information obtained from Patient She is here in follow up evaluation of right lower extremity ulcers History of Present Illness (HPI) The following HPI elements were documented for the patient's wound: Location: right lower extremity lateral calf several large areas Quality: Patient reports experiencing a sharp pain to affected area(s). Severity: Patient states wound are getting worse. Duration: Patient has had the wound for < 5 weeks prior to presenting for treatment Timing: Pain in wound is Intermittent (comes and goes Context: The wound would happen gradually Modifying Factors: Other treatment(s) tried include:admission to the hospital for sepsis and a full workup with treatment with IV antibiotics and discharged on oral antibiotics Associated Signs and Symptoms: Patient reports having increase discharge. 37 year old patient well known to our [facility] wound care clinic where she has been seen since 2016 for bilateral lower extremity venous insufficiency disease with lymphedema and multiple ulcerations associated with morbid obesity. she had custom-made compression stockings and lymphedema pumps which were used in the past. most recently she was admitted to the hospital between October 11 and 09/02/2017 with sepsis, lower extremity wounds and lymphedema.she was initially treated in the outpatient with Keflex and Bactrim. she was initially treated in the hospital with vancomycin and Zosyn and changed over to Unasyn until her white count improved and her blood cultures were negative for 3 days. After her inpatient management she was discharged home on Augmentin to end on 09/13/2017 with a 14 day course. she has had outpatient vascular duplex scans completed in November 2017 and her right ABI was 1.1 and the left ABI is 1.3. she had normal toe brachial indices bilaterally.she had three-vessel runoff in the right lower extremity and  two-vessel runoff in the left lower extremity. On questioning the patient she does have custom made compression stockings and also has a lymphedema pump but has not been using it appropriately and has not been taking good care of herself. 09/17/2017 -- she returns today with compression stockings on the left side and the right side has had significant amount of drainage and has a very strong odor 09/24/2017 -- the drainage is increased significantly and she has more lymphedema and a very strong odor to her wound. Though she does not have systemic symptoms, or overt infection I believe she will benefit from some doxycycline given empirically. 10/01/2017 -- after starting the doxycycline and changing the dressing twice a week her symptoms and signs have definitely improved overall. 10/08/2017 -- she has completed her course of doxycycline but continues  to have a lot of drainage and needs twice a week dressing changes. 11/08/17-she is here in follow-up evaluation for right lower extremity ulcers. She admits to using her lymphedema pumps twice daily, one hour per session. she is voicing no complaints or concerns, no signs of infection will change to [facility], Cooper, Brandin R. ([id number]) 11/22/17 on evaluation today patient presents for reevaluation concerning her wound over the right lateral lower extremity secondary to lymphedema. She has been tolerating the dressing changes which is good news. Fortunately she continues to use her lymphedema pumps as well which I think is beneficial for her. We did switch to Prisma several weeks ago which I think has been a good change for her. Patient is not having any significant pain at this point. Patient History Information obtained from Patient. Family History Kidney Disease - Mother, No family history of Cancer, Diabetes, Heart Disease, Hereditary Spherocytosis, Hypertension, Lung Disease, Seizures, Stroke, Thyroid Problems, Tuberculosis. Social  History Never smoker, Marital Status - Married, Alcohol Use - Never, Drug Use - No History, Caffeine Use - Daily. Review of Systems (ROS) Constitutional Symptoms (General Health) Denies complaints or symptoms of Fever, Chills. Respiratory The patient has no complaints or symptoms. Cardiovascular Complains or has symptoms of LE edema. Psychiatric The patient has no complaints or symptoms. Objective Constitutional Obese and well-hydrated in no acute distress. Vitals Time Taken: 8:18 AM, Height: 74 in, Weight: 505 lbs, BMI: 64.8, Temperature: 98.4 F, Pulse: 92 bpm, Respiratory Rate: 18 breaths/min, Blood Pressure: 146/93 mmHg. Respiratory normal breathing without difficulty. clear to auscultation bilaterally. Cardiovascular 1+ pitting edema of the bilateral lower extremities. Psychiatric this patient is able to make decisions and demonstrates good insight into disease process. Alert and Oriented x 3. pleasant and cooperative. General Notes: Patient has no slough covering her wound bed at this point. She does have granulation although this granulation does appear to be hyper granular in nature. Integumentary (Hair, Skin) Tricarico, Kellyanne R. ([id number]) Wound #2 status is Open. Original cause of wound was Gradually Appeared. The wound is located on the Right,Distal,Lateral Lower Leg. The wound measures 3.5cm length x 4.5cm width x 0.2cm depth; 12.37cm^2 area and 2.474cm^3 volume. There is Fat Layer (Subcutaneous Tissue) Exposed exposed. There is no tunneling or undermining noted. There is a large amount of serosanguineous drainage noted. The wound margin is flat and intact. There is medium (34-66%) red granulation within the wound bed. There is a medium (34-66%) amount of necrotic tissue within the wound bed including Adherent Slough. The periwound skin appearance did not exhibit: Callus, Crepitus, Excoriation, Induration, Rash, Scarring, Dry/Scaly, Maceration, Atrophie Blanche, Cyanosis,  Ecchymosis, Hemosiderin Staining, Mottled, Pallor, Rubor, Erythema. Periwound temperature was noted as No Abnormality. The periwound has tenderness on palpation. Assessment Active Problems ICD-10 L97.212 - 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d I89.0 - Lymphedema, not elsewhere classified I87.311 - Chronic venous hypertension (idiopathic) with ulcer of right lower extremity E66.01 - Morbid (severe) obesity due to excess calories Plan Wound Cleansing: Wound #2 Right,Distal,Lateral Lower Leg: Clean wound with Normal Saline. Cleanse wound with mild soap and water Anesthetic (add to Medication List): Wound #2 Right,Distal,Lateral Lower Leg: Topical Lidocaine 4% cream applied to wound bed prior to debridement (In Clinic Only). Skin Barriers/Peri-Wound Care: Wound #2 Right,Distal,Lateral Lower Leg: Barrier cream Moisturizing lotion Primary Wound Dressing: Wound #2 Right,Distal,Lateral Lower Leg: Hydrafera Blue Secondary Dressing: Wound #2 Right,Distal,Lateral Lower Leg: ABD pad XtraSorb Other - charcoal Dressing Change Frequency: Wound #2 Right,Distal,Lateral Lower Leg: Dressing is  to be changed Monday and Thursday. Follow-up Appointments: Return Appointment in 1 week. Nurse Visit as needed Edema Control: Wound #2 Right,Distal,Lateral Lower Leg: 4-Layer Compression System - Right Lower Extremity - unna to anchor Elevate legs to the level of the heart and pump ankles as often as possible Duvall, Lachina R. ([id number]) Additional Orders / Instructions: Wound #2 Right,Distal,Lateral Lower Leg: Vitamin A; Vitamin C, Zinc Increase protein intake. Other: - Please watch your salt (sodium) intake I'm going to recommend that we continue with the four layer compression wraps along with the [facility] Dressing of the time being. I think this is definitely improving her overall status. We will see where things stand in one weeks time. Please see above for  specific wound care orders. We will see patient for re-evaluation in 1 week(s) here in the clinic. If anything worsens or changes patient will contact our office for additional recommendations. Electronic Signature(s) Signed: 11/22/2017 6:11:24 PM By: Lenda Kelp PA-C Entered By: Lenda Kelp on 11/22/2017 09:38:13 Petitti, Loretto RMarland Kitchen ([id number]) -------------------------------------------------------------------------------- ROS/PFSH Details Patient Name: [name] R. Date of Service: 11/22/2017 8:15 AM Medical Record Number: [id number] Patient Account Number: [id number] Date of Birth/Sex: 06/26/1981 (37 y.o. Female) Treating RN: Renne Crigler Primary Care Provider: PATIENT, NO Other Clinician: Referring Provider: Referral, Self Treating Provider/Extender: STONE III, Brenson Hartman Weeks in Treatment: 10 Information Obtained From Patient Wound History Do you currently have one or more open woundso Yes How many open wounds do you currently haveo 1 Approximately how long have you had your woundso 1 month How have you been treating your wound(s) until nowo aquacel ag Has your wound(s) ever healed and then re-openedo No Have you had any lab work done in the past montho No Have you tested positive for an antibiotic resistant organism (MRSA, VRE)o No Have you tested positive for osteomyelitis (bone infection)o No Have you had any tests for circulation on your legso Yes Who ordered the testo [facility] Where was the test doneo [facility] Constitutional Symptoms (General Health) Complaints and Symptoms: Negative for: Fever; Chills Cardiovascular Complaints and Symptoms: Positive for: LE edema Medical History: Positive for: Hypertension Negative for: Angina; Arrhythmia; Congestive Heart Failure; Coronary Artery Disease; Deep Vein Thrombosis; Hypotension; Myocardial Infarction; Peripheral Arterial Disease; Peripheral Venous Disease; Phlebitis; Vasculitis Hematologic/Lymphatic Medical  History: Positive for: Lymphedema Respiratory Complaints and Symptoms: No Complaints or Symptoms Medical History: Negative for: Aspiration; Asthma; Chronic Obstructive Pulmonary Disease (COPD); Pneumothorax; Sleep Apnea; Tuberculosis Psychiatric Complaints and Symptoms: No Complaints or Symptoms [place], Rafaella R. ([id number]) Immunizations Pneumococcal Vaccine: Received Pneumococcal Vaccination: No Immunization Notes: up to date Implantable Devices Family and Social History Cancer: No; Diabetes: No; Heart Disease: No; Hereditary Spherocytosis: No; Hypertension: No; Kidney Disease: Yes - Mother; Lung Disease: No; Seizures: No; Stroke: No; Thyroid Problems: No; Tuberculosis: No; Never smoker; Marital Status - Married; Alcohol Use: Never; Drug Use: No History; Caffeine Use: Daily; Financial Concerns: No; Food, Clothing or Shelter Needs: No; Support System Lacking: No; Transportation Concerns: No; Advanced Directives: No; Patient does not want information on Advanced Directives Physician Affirmation I have reviewed and agree with the above information. Electronic Signature(s) Signed: 11/22/2017 5:09:14 PM By: Renne Crigler Signed: 11/22/2017 6:11:24 PM By: Lenda Kelp PA-C Entered By: Lenda Kelp on 11/22/2017 09:36:44 Fakhouri, Emmaline R. ([id number]) -------------------------------------------------------------------------------- SuperBill Details Patient Name: [name] R. Date of Service: 11/22/2017 Medical Record Number: [id number] Patient Account Number: [id number] Date of Birth/Sex: 10/15/1981 (37 y.o. Female) Treating RN: Renne Crigler Primary Care Provider: PATIENT, NO  Other Clinician: Referring Provider: Referral, Self Treating Provider/Extender: STONE III, Tzion Wedel Weeks in Treatment: 10 Diagnosis Coding ICD-10 Codes Code Description [phone] 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d I89.0 Lymphedema, not elsewhere classified I87.311 Chronic  venous hypertension (idiopathic) with ulcer of right lower extremity E66.01 Morbid (severe) obesity due to excess calories Facility Procedures CPT4 Code: [id number] Description: [phone] - WOUND CARE VISIT-LEV 2 EST PT Modifier: Quantity: 1 Physician Procedures CPT4 Code: [id number] Description: 99213 - WC PHYS LEVEL 3 - EST PT ICD-10 Diagnosis Description L97.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 I89.0 Lymphedema, not elsewhere classified I87.311 Chronic venous hypertension (idiopathic) with ulcer of  right l E66.01 Morbid (severe) obesity due to excess calories Modifier: d ower extremity Quantity: 1 Electronic Signature(s) Signed: 11/22/2017 6:11:24 PM By: Lenda Kelp PA-C Entered By: Lenda Kelp on 11/22/2017 09:38:36

## 2017-11-26 NOTE — Progress Notes (Signed)
Jessica, [name] ([id number]) Visit Report for 11/26/2017 Arrival Information Details Patient Name: [name], [name] Date of Service: 11/26/2017 8:45 AM Medical Record Number: [id number] Patient Account Number: [id number] Date of Birth/Sex: 05/04/1981 (37 y.o. Female) Treating RN: Huel Coventry Primary Care Tatumn Corbridge: PATIENT, NO Other Clinician: Referring Blayne Frankie: Referral, Self Treating Jaclin Finks/Extender: STONE III, HOYT Weeks in Treatment: 11 Visit Information History Since Last Visit Added or deleted any medications: No Patient Arrived: Ambulatory Any new allergies or adverse reactions: No Arrival Time: 09:02 Had a fall or experienced change in No Accompanied By: self activities of daily living that may affect Transfer Assistance: None risk of falls: Patient Identification Verified: Yes Signs or symptoms of abuse/neglect since last visito No Secondary Verification Process Completed: Yes Hospitalized since last visit: No Patient Requires Transmission-Based No Has Compression in Place as Prescribed: Yes Precautions: Pain Present Now: No Patient Has Alerts: No Electronic Signature(s) Signed: 11/26/2017 11:51:51 AM By: Elliot Gurney, BSN, RN, CWS, Kim RN, BSN Entered By: Elliot Gurney, BSN, RN, CWS, Kim on 11/26/2017 09:03:02 [name] ([id number]) -------------------------------------------------------------------------------- Compression Therapy Details Patient Name: Jessica Casey R. Date of Service: 11/26/2017 8:45 AM Medical Record Number: [id number] Patient Account Number: [id number] Date of Birth/Sex: Nov 02, 1981 (37 y.o. Female) Treating RN: Huel Coventry Primary Care Ladainian Therien: PATIENT, NO Other Clinician: Referring Maley Venezia: Referral, Self Treating Yeilyn Gent/Extender: STONE III, HOYT Weeks in Treatment: 11 Compression Therapy Performed for Wound Assessment: Wound #2 Right,Distal,Lateral Lower Leg Performed By: Clinician Huel Coventry, RN Compression Type: Four Layer Electronic  Signature(s) Signed: 11/26/2017 11:51:51 AM By: Elliot Gurney, BSN, RN, CWS, Kim RN, BSN Entered By: Elliot Gurney, BSN, RN, CWS, Kim on 11/26/2017 09:27:18 [name] ([id number]) -------------------------------------------------------------------------------- Encounter Discharge Information Details Patient Name: Jessica Casey R. Date of Service: 11/26/2017 8:45 AM Medical Record Number: [id number] Patient Account Number: [id number] Date of Birth/Sex: [birth year] (37 y.o. Female) Treating RN: Huel Coventry Primary Care Alma Mohiuddin: PATIENT, NO Other Clinician: Referring Martin Belling: Referral, Self Treating Jeania Nater/Extender: STONE III, HOYT Weeks in Treatment: 11 Encounter Discharge Information Items Discharge Pain Level: 0 Discharge Condition: Stable Ambulatory Status: Ambulatory Discharge Destination: Home Private Transportation: Auto Accompanied By: self Schedule Follow-up Appointment: Yes Medication Reconciliation completed and Yes provided to Patient/Care Takeshia Wenk: Clinical Summary of Care: Electronic Signature(s) Signed: 11/26/2017 11:51:51 AM By: Elliot Gurney, BSN, RN, CWS, Kim RN, BSN Entered By: Elliot Gurney, BSN, RN, CWS, Kim on 11/26/2017 09:28:43 [name] ([id number]) -------------------------------------------------------------------------------- Patient/Caregiver Education Details Patient Name: [name] Date of Service: 11/26/2017 8:45 AM Medical Record Number: [id number] Patient Account Number: [id number] Date of Birth/Gender: 1981/07/08 (37 y.o. Female) Treating RN: Huel Coventry Primary Care Physician: PATIENT, NO Other Clinician: Referring Physician: Referral, Self Treating Physician/Extender: Linwood Dibbles, HOYT Weeks in Treatment: 11 Education Assessment Education Provided To: Patient Education Topics Provided Venous: Handouts: Controlling Swelling with Multilayered Compression Wraps Methods: Demonstration, Explain/Verbal Responses: State content correctly Wound/Skin  Impairment: Handouts: Caring for Your Ulcer Methods: Demonstration Responses: State content correctly Electronic Signature(s) Signed: 11/26/2017 11:51:51 AM By: Elliot Gurney, BSN, RN, CWS, Kim RN, BSN Entered By: Elliot Gurney, BSN, RN, CWS, Kim on 11/26/2017 75:64:33 [name] ([id number]) -------------------------------------------------------------------------------- Wound Assessment Details Patient Name: Jessica Casey R. Date of Service: 11/26/2017 8:45 AM Medical Record Number: [id number] Patient Account Number: [id number] Date of Birth/Sex: November 15, 1981 (37 y.o. Female) Treating RN: Huel Coventry Primary Care Jillisa Harris: PATIENT, NO Other Clinician: Referring Noboru Bidinger: Referral, Self Treating Aarush Stukey/Extender: STONE III, HOYT Weeks in Treatment: 11 Wound Status Wound Number: 2 Primary Etiology: Lymphedema Wound Location: Right, Distal, Lateral Lower Leg Wound Status: Open  Wounding Event: Gradually Appeared Date Acquired: 08/08/2017 Weeks Of Treatment: 11 Clustered Wound: No Photos Photo Uploaded By: Elliot GurneyWoody, BSN, RN, CWS, Kim on 11/26/2017 13:17:10 Wound Measurements Length: (cm) 3.5 Width: (cm) 4.5 Depth: (cm) 0.2 Area: (cm) 12.37 Volume: (cm) 2.474 % Reduction in Area: 42.3% % Reduction in Volume: 61.5% Wound Description Full Thickness With Exposed Support Classification: Structures Periwound Skin Texture Texture Color No Abnormalities Noted: No No Abnormalities Noted: No Moisture No Abnormalities Noted: No Treatment Notes Wound #2 (Right, Distal, Lateral Lower Leg) 1. Cleansed with: Clean wound with Normal Saline 3. Peri-wound Care: Barrier cream Moisturizing lotion 4. Dressing Applied: Hydrafera Blue Tristan, Jylian R. ([id number]) 5. Secondary Dressing Applied ABD Pad 7. Secured with 4-Layer Compression System - Right Lower Extremity Notes xtrasorb, charcoal; pink unna paste Electronic Signature(s) Signed: 11/26/2017 11:51:51 AM By: Elliot GurneyWoody, BSN, RN, CWS, Kim RN,  BSN Entered By: Elliot GurneyWoody, BSN, RN, CWS, Kim on 11/26/2017 09:09:51

## 2017-11-29 ENCOUNTER — Encounter: Payer: Self-pay | Admitting: Physician Assistant

## 2017-12-01 NOTE — Progress Notes (Signed)
CHARLEN, BAKULA ([id number]) Visit Report for 11/29/2017 Arrival Information Details Patient Name: [name], [name] Date of Service: 11/29/2017 3:15 PM Medical Record Number: [id number] Patient Account Number: [id number] Date of Birth/Sex: 1980-11-22 (37 y.o. Female) Treating RN: Ashok Cordia, Debi Primary Care Najee Manninen: PATIENT, NO Other Clinician: Referring Timiyah Romito: Referral, Self Treating Athalee Esterline/Extender: STONE III, HOYT Weeks in Treatment: 11 Visit Information History Since Last Visit All ordered tests and consults were completed: No Patient Arrived: Ambulatory Added or deleted any medications: No Arrival Time: 15:29 Any new allergies or adverse reactions: No Accompanied By: self Had a fall or experienced change in No Transfer Assistance: None activities of daily living that may affect Patient Identification Verified: Yes risk of falls: Secondary Verification Process Completed: Yes Signs or symptoms of abuse/neglect since last visito No Patient Requires Transmission-Based No Hospitalized since last visit: No Precautions: Has Dressing in Place as Prescribed: Yes Patient Has Alerts: No Has Compression in Place as Prescribed: Yes Pain Present Now: Yes Electronic Signature(s) Signed: 11/29/2017 4:57:34 PM By: [name] Entered By: [name] on 11/29/2017 15:29:27 [name] ([id number]) -------------------------------------------------------------------------------- Encounter Discharge Information Details Patient Name: Jessica Casey R. Date of Service: 11/29/2017 3:15 PM Medical Record Number: [id number] Patient Account Number: [id number] Date of Birth/Sex: 04-21-1981 (37 y.o. Female) Treating RN: Ashok Cordia, Debi Primary Care Lalanya Rufener: PATIENT, NO Other Clinician: Referring Briyana Badman: Referral, Self Treating Rayce Brahmbhatt/Extender: STONE III, HOYT Weeks in Treatment: 11 Encounter Discharge Information Items Discharge Pain Level: 0 Discharge Condition:  Stable Ambulatory Status: Ambulatory Discharge Destination: Home Transportation: Private Auto Accompanied By: self Schedule Follow-up Appointment: Yes Medication Reconciliation completed and No provided to Patient/Care Akira Adelsberger: Provided on Clinical Summary of Care: 11/29/2017 Form Type Recipient Paper Patient EL Electronic Signature(s) Signed: 11/30/2017 10:03:49 AM By: Gwenlyn Perking Previous Signature: 11/29/2017 3:52:09 PM Version By: [name] Entered By: Gwenlyn Perking on 11/29/2017 16:28:54 Avetisyan, Cristal Deer ([id number]) -------------------------------------------------------------------------------- Lower Extremity Assessment Details Patient Name: Jessica Casey R. Date of Service: 11/29/2017 3:15 PM Medical Record Number: [id number] Patient Account Number: [id number] Date of Birth/Sex: [birth year] (37 y.o. Female) Treating RN: Ashok Cordia, Debi Primary Care Maurie Olesen: PATIENT, NO Other Clinician: Referring Toddrick Sanna: Referral, Self Treating Khristopher Kapaun/Extender: STONE III, HOYT Weeks in Treatment: 11 Edema Assessment Assessed: [Left: No] [Right: No] [Left: Edema] [Right: :] Calf Left: Right: Point of Measurement: 35 cm From Medial Instep cm 59.4 cm Ankle Left: Right: Point of Measurement: 9 cm From Medial Instep cm 36.8 cm Vascular Assessment Pulses: Dorsalis Pedis Palpable: [Right:Yes] Posterior Tibial Extremity colors, hair growth, and conditions: Extremity Color: [Right:Normal] Temperature of Extremity: [Right:Warm] Capillary Refill: [Right:< 3 seconds] Toe Nail Assessment Left: Right: Thick: Yes Discolored: Yes Deformed: Yes Improper Length and Hygiene: Yes Electronic Signature(s) Signed: 11/29/2017 4:57:34 PM By: [name] Entered By: [name] on 11/29/2017 15:42:22 Stratmann, Susana R. ([id number]) -------------------------------------------------------------------------------- Multi Wound Chart Details Patient Name: Jessica Casey R. Date of  Service: 11/29/2017 3:15 PM Medical Record Number: [id number] Patient Account Number: [id number] Date of Birth/Sex: Jan 12, 1981 (37 y.o. Female) Treating RN: Ashok Cordia, Debi Primary Care Sulma Ruffino: PATIENT, NO Other Clinician: Referring Bayard More: Referral, Self Treating Milagros Middendorf/Extender: STONE III, HOYT Weeks in Treatment: 11 Vital Signs Height(in): 74 Pulse(bpm): 106 Weight(lbs): 505 Blood Pressure(mmHg): 137/83 Body Mass Index(BMI): 65 Temperature(F): 97.9 Respiratory Rate 18 (breaths/min): Photos: [2:No Photos] [N/A:N/A] Wound Location: [2:Right Lower Leg - Lateral, Distal] [N/A:N/A] Wounding Event: [2:Gradually Appeared] [N/A:N/A] Primary Etiology: [2:Lymphedema] [N/A:N/A] Comorbid History: [2:Lymphedema, Hypertension] [N/A:N/A] Date Acquired: [2:08/08/2017] [N/A:N/A] Weeks of Treatment: [2:11] [N/A:N/A] Wound Status: [2:Open] [N/A:N/A] Measurements L x W  x D [2:2.8x4.4x0.1] [N/A:N/A] (cm) Area (cm) : [2:9.676] [N/A:N/A] Volume (cm) : [2:0.968] [N/A:N/A] % Reduction in Area: [2:54.90%] [N/A:N/A] % Reduction in Volume: [2:85.00%] [N/A:N/A] Classification: [2:Full Thickness With Exposed Support Structures] [N/A:N/A] Granulation Amount: [2:Medium (34-66%)] [N/A:N/A] Granulation Quality: [2:Red] [N/A:N/A] Necrotic Amount: [2:Medium (34-66%)] [N/A:N/A] Exposed Structures: [2:Fascia: No Fat Layer (Subcutaneous Tissue) Exposed: No Tendon: No Muscle: No Joint: No Bone: No] [N/A:N/A] Epithelialization: [2:None] [N/A:N/A] Periwound Skin Texture: [2:No Abnormalities Noted] [N/A:N/A] Periwound Skin Moisture: [2:No Abnormalities Noted] [N/A:N/A] Periwound Skin Color: [2:No Abnormalities Noted] [N/A:N/A] Temperature: [2:No Abnormality] [N/A:N/A] Tenderness on Palpation: [2:Yes] [N/A:N/A] Wound Preparation: [2:Ulcer Cleansing: Rinsed/Irrigated with Saline] [N/A:N/A] Topical Anesthetic Applied: Other: lidocaine 4% Treatment Notes Electronic Signature(s) Signed: 11/29/2017  3:51:53 PM By: [name] Entered By: [name] on 11/29/2017 15:51:52 Claude, Cristal Deer ([id number]) -------------------------------------------------------------------------------- Multi-Disciplinary Care Plan Details Patient Name: Jessica Casey R. Date of Service: 11/29/2017 3:15 PM Medical Record Number: [id number] Patient Account Number: [id number] Date of Birth/Sex: 06-08-1981 (37 y.o. Female) Treating RN: Ashok Cordia, Debi Primary Care Tajai Ihde: PATIENT, NO Other Clinician: Referring Shaleigh Laubscher: Referral, Self Treating Lativia Velie/Extender: STONE III, HOYT Weeks in Treatment: 11 Active Inactive ` Orientation to the Wound Care Program Nursing Diagnoses: Knowledge deficit related to the wound healing center program Goals: Patient/caregiver will verbalize understanding of the Wound Healing Center Program Date Initiated: 09/10/2017 Target Resolution Date: 11/23/2017 Goal Status: Active Interventions: Provide education on orientation to the wound center Notes: ` Venous Leg Ulcer Nursing Diagnoses: Knowledge deficit related to disease process and management Goals: Patient/caregiver will verbalize understanding of disease process and disease management Date Initiated: 09/10/2017 Target Resolution Date: 11/23/2017 Goal Status: Active Interventions: Assess peripheral edema status every visit. Notes: ` Wound/Skin Impairment Nursing Diagnoses: Impaired tissue integrity Goals: Ulcer/skin breakdown will heal within 14 weeks Date Initiated: 09/10/2017 Target Resolution Date: 11/24/2017 Goal Status: Active Interventions: TOWANDA, HORNSTEIN ([id number]) Assess patient/caregiver ability to obtain necessary supplies Assess patient/caregiver ability to perform ulcer/skin care regimen upon admission and as needed Assess ulceration(s) every visit Notes: Electronic Signature(s) Signed: 11/29/2017 3:51:43 PM By: [name] Entered By: [name] on 11/29/2017 15:51:42 Samad,  Letesha R. ([id number]) -------------------------------------------------------------------------------- Pain Assessment Details Patient Name: Jessica Casey R. Date of Service: 11/29/2017 3:15 PM Medical Record Number: [id number] Patient Account Number: [id number] Date of Birth/Sex: 1981-04-05 (37 y.o. Female) Treating RN: Ashok Cordia, Debi Primary Care Lileigh Fahringer: PATIENT, NO Other Clinician: Referring Dorsie Burich: Referral, Self Treating Ahriana Gunkel/Extender: STONE III, HOYT Weeks in Treatment: 11 Active Problems Location of Pain Severity and Description of Pain Patient Has Paino Yes Site Locations Pain Location: Pain in Ulcers Rate the pain. Current Pain Level: 4 Character of Pain Describe the Pain: Burning Pain Management and Medication Current Pain Management: Electronic Signature(s) Signed: 11/29/2017 4:57:34 PM By: [name] Entered By: [name] on 11/29/2017 15:29:46 Feger, Promyse R. ([id number]) -------------------------------------------------------------------------------- Patient/Caregiver Education Details Patient Name: Jessica Casey R. Date of Service: 11/29/2017 3:15 PM Medical Record Number: [id number] Patient Account Number: [id number] Date of Birth/Gender: Jan 27, 1981 (37 y.o. Female) Treating RN: Ashok Cordia, Debi Primary Care Physician: PATIENT, NO Other Clinician: Referring Physician: Referral, Self Treating Physician/Extender: Linwood Dibbles, HOYT Weeks in Treatment: 11 Education Assessment Education Provided To: Patient Education Topics Provided Wound/Skin Impairment: Handouts: Caring for Your Ulcer, Other: change dressing as ordered Methods: Demonstration, Explain/Verbal Responses: State content correctly Electronic Signature(s) Signed: 11/29/2017 4:57:34 PM By: [name] Entered By: [name] on 11/29/2017 15:52:36 Kloosterman, Bralee R. ([id number]) -------------------------------------------------------------------------------- Wound Assessment  Details Patient Name: Jessica Casey R. Date of Service: 11/29/2017 3:15 PM Medical Record  Number: [id number] Patient Account Number: [id number] Date of Birth/Sex: 03-01-1981 (36 y.o. Female) Treating RN: Ashok CordiaPinkerton, Debi Primary Care Shyheem Whitham: PATIENT, NO Other Clinician: Referring Maximiliano Cromartie: Referral, Self Treating Ravenne Wayment/Extender: STONE III, HOYT Weeks in Treatment: 11 Wound Status Wound Number: 2 Primary Etiology: Lymphedema Wound Location: Right Lower Leg - Lateral, Distal Wound Status: Open Wounding Event: Gradually Appeared Comorbid History: Lymphedema, Hypertension Date Acquired: 08/08/2017 Weeks Of Treatment: 11 Clustered Wound: No Photos Photo Uploaded By: Alejandro MullingPinkerton, Debra on 11/29/2017 16:55:06 Wound Measurements Length: (cm) 2.8 Width: (cm) 4.4 Depth: (cm) 0.1 Area: (cm) 9.676 Volume: (cm) 0.968 % Reduction in Area: 54.9% % Reduction in Volume: 85% Epithelialization: None Tunneling: No Undermining: No Wound Description Full Thickness With Exposed Support Classification: Structures Foul Odor After Cleansing: No Slough/Fibrino Yes Wound Bed Granulation Amount: Medium (34-66%) Exposed Structure Granulation Quality: Red Fascia Exposed: No Necrotic Amount: Medium (34-66%) Fat Layer (Subcutaneous Tissue) Exposed: No Necrotic Quality: Adherent Slough Tendon Exposed: No Muscle Exposed: No Joint Exposed: No Bone Exposed: No Periwound Skin Texture Texture Color No Abnormalities Noted: No No Abnormalities Noted: Yes Hauss, Karlissa R. ([id number]) Moisture Temperature / Pain No Abnormalities Noted: No Temperature: No Abnormality Tenderness on Palpation: Yes Wound Preparation Ulcer Cleansing: Rinsed/Irrigated with Saline Topical Anesthetic Applied: Other: lidocaine 4%, Treatment Notes Wound #2 (Right, Distal, Lateral Lower Leg) 1. Cleansed with: Clean wound with Normal Saline 2. Anesthetic Topical Lidocaine 4% cream to wound bed prior to debridement 3.  Peri-wound Care: Barrier cream Moisturizing lotion 4. Dressing Applied: Hydrafera Blue 5. Secondary Dressing Applied ABD Pad Dry Gauze 7. Secured with Tape 4-Layer Compression System - Right Lower Extremity Notes xtrasorb, charcoal, unna to anchor Electronic Signature(s) Signed: 11/29/2017 4:57:34 PM By: Alejandro MullingPinkerton, Debra Entered By: Alejandro MullingPinkerton, Debra on 11/29/2017 15:41:02 Greif, Mikylah R. ([id number]) -------------------------------------------------------------------------------- Vitals Details Patient Name: Jessica CaseyLEATH, Gaynelle R. Date of Service: 11/29/2017 3:15 PM Medical Record Number: [id number] Patient Account Number: [id number] Date of Birth/Sex: 03-01-1981 (37 y.o. Female) Treating RN: Ashok CordiaPinkerton, Debi Primary Care Esteven Overfelt: PATIENT, NO Other Clinician: Referring Ernesteen Mihalic: Referral, Self Treating Farzana Koci/Extender: STONE III, HOYT Weeks in Treatment: 11 Vital Signs Time Taken: 15:29 Temperature (F): 97.9 Height (in): 74 Pulse (bpm): 106 Weight (lbs): 505 Respiratory Rate (breaths/min): 18 Body Mass Index (BMI): 64.8 Blood Pressure (mmHg): 137/83 Reference Range: 80 - 120 mg / dl Electronic Signature(s) Signed: 11/29/2017 4:57:34 PM By: Alejandro MullingPinkerton, Debra Entered By: Alejandro MullingPinkerton, Debra on 11/29/2017 15:31:30

## 2017-12-02 NOTE — Progress Notes (Signed)
ABBRIELLE, [name] ([id number]) Visit Report for 11/29/2017 Chief Complaint Document Details Patient Name: Jessica, [name]. Date of Service: 11/29/2017 3:15 PM Medical Record Number: [id number] Patient Account Number: [id number] Date of Birth/Sex: 07-05-1981 (37 y.o. Female) Treating RN: Ashok Cordia, Debi Primary Care Provider: PATIENT, NO Other Clinician: Referring Provider: Referral, Self Treating Provider/Extender: STONE III, HOYT Weeks in Treatment: 11 Information Obtained from: Patient Chief Complaint She is here in follow up evaluation of right lower extremity ulcers Electronic Signature(s) Signed: 11/30/2017 8:05:24 AM By: Lenda Kelp PA-C Entered By: Lenda Kelp on 11/29/2017 16:14:28 [name] ([id number]) -------------------------------------------------------------------------------- Debridement Details Patient Name: [name] Date of Service: 11/29/2017 3:15 PM Medical Record Number: [id number] Patient Account Number: [id number] Date of Birth/Sex: June 04, 1981 (37 y.o. Female) Treating RN: Ashok Cordia, Debi Primary Care Provider: PATIENT, NO Other Clinician: Referring Provider: Referral, Self Treating Provider/Extender: STONE III, HOYT Weeks in Treatment: 11 Debridement Performed for Wound #2 Right,Distal,Lateral Lower Leg Assessment: Performed By: Physician STONE III, HOYT E., PA-C Debridement: Debridement Pre-procedure Verification/Time Yes - 16:08 Out Taken: Start Time: 16:09 Pain Control: Lidocaine 4% Topical Solution Level: Skin/Subcutaneous Tissue Total Area Debrided (L x W): 2.8 (cm) x 4.4 (cm) = 12.32 (cm) Tissue and other material Viable, Non-Viable, Exudate, Fibrin/Slough, Subcutaneous debrided: Instrument: Curette Bleeding: Minimum Hemostasis Achieved: Pressure End Time: 16:12 Procedural Pain: 0 Post Procedural Pain: 0 Response to Treatment: Procedure was tolerated well Post Debridement Measurements of Total Wound Length: (cm) 2.8 Width:  (cm) 4.4 Depth: (cm) 0.2 Volume: (cm) 1.935 Character of Wound/Ulcer Post Debridement: Requires Further Debridement Post Procedure Diagnosis Same as Pre-procedure Electronic Signature(s) Signed: 11/29/2017 4:57:34 PM By: [name] Signed: 11/30/2017 8:05:24 AM By: Lenda Kelp PA-C Entered By: [name] on 11/29/2017 16:13:08 [name] ([id number]) -------------------------------------------------------------------------------- HPI Details Patient Name: [name] Date of Service: 11/29/2017 3:15 PM Medical Record Number: [id number] Patient Account Number: [id number] Date of Birth/Sex: [birth year] (37 y.o. Female) Treating RN: Ashok Cordia, Debi Primary Care Provider: PATIENT, NO Other Clinician: Referring Provider: Referral, Self Treating Provider/Extender: STONE III, HOYT Weeks in Treatment: 11 History of Present Illness Location: right lower extremity lateral calf several large areas Quality: Patient reports experiencing a sharp pain to affected area(s). Severity: Patient states wound are getting worse. Duration: Patient has had the wound for < 5 weeks prior to presenting for treatment Timing: Pain in wound is Intermittent (comes and goes Context: The wound would happen gradually Modifying Factors: Other treatment(s) tried include:admission to the hospital for sepsis and a full workup with treatment with IV antibiotics and discharged on oral antibiotics Associated Signs and Symptoms: Patient reports having increase discharge. HPI Description: 37 year old patient well known to our [facility] wound care clinic where she has been seen since 2016 for bilateral lower extremity venous insufficiency disease with lymphedema and multiple ulcerations associated with morbid obesity. she had custom-made compression stockings and lymphedema pumps which were used in the past. most recently she was admitted to the hospital between October 11 and 09/02/2017 with sepsis,  lower extremity wounds and lymphedema.she was initially treated in the outpatient with Keflex and Bactrim. she was initially treated in the hospital with vancomycin and Zosyn and changed over to Unasyn until her white count improved and her blood cultures were negative for 3 days. After her inpatient management she was discharged home on Augmentin to end on 09/13/2017 with a 14 day course. she has had outpatient vascular duplex scans completed in November 2017 and her right ABI was 1.1 and the left  ABI is 1.3. she had normal toe brachial indices bilaterally.she had three-vessel runoff in the right lower extremity and two-vessel runoff in the left lower extremity. On questioning the patient she does have custom made compression stockings and also has a lymphedema pump but has not been using it appropriately and has not been taking good care of herself. 09/17/2017 -- she returns today with compression stockings on the left side and the right side has had significant amount of drainage and has a very strong odor 09/24/2017 -- the drainage is increased significantly and she has more lymphedema and a very strong odor to her wound. Though she does not have systemic symptoms, or overt infection I believe she will benefit from some doxycycline given empirically. 10/01/2017 -- after starting the doxycycline and changing the dressing twice a week her symptoms and signs have definitely improved overall. 10/08/2017 -- she has completed her course of doxycycline but continues to have a lot of drainage and needs twice a week dressing changes. 11/08/17-she is here in follow-up evaluation for right lower extremity ulcers. She admits to using her lymphedema pumps twice daily, one hour per session. she is voicing no complaints or concerns, no signs of infection [facility] 11[facility]/17 on evaluation today patient appears to be doing excellent in regard to her right lateral lower extremity wound. There  is some Slough noted on the surface of the wound today but other than that the wound appears to be clear there's no evidence of infection. No fevers, chills, nausea, or vomiting noted at this time. She is having no pain. Electronic Signature(s) Signed: 11/30/2017 8:05:24 AM By: Lenda KelpStone III, Hoyt PA-C Entered By: Lenda KelpStone III, Hoyt on 11/29/2017 17:00:59 [name] ([id number]) Karilyn CotaLEATH, Cristal DeerERICA [name]. ([id number]) -------------------------------------------------------------------------------- Physical Exam Details Patient Name: Jessica CaseyLEATH, [name] Date of Service: 11/29/2017 3:15 PM Medical Record Number: [id number] Patient Account Number: [id number] Date of Birth/Sex: 1981-05-11 (37 y.o. Female) Treating RN: Ashok CordiaPinkerton, Debi Primary Care Provider: PATIENT, NO Other Clinician: Referring Provider: Referral, Self Treating Provider/Extender: STONE III, HOYT Weeks in Treatment: 11 Constitutional Well-nourished and well-hydrated in no acute distress. Respiratory normal breathing without difficulty. Psychiatric this patient is able to make decisions and demonstrates good insight into disease process. Alert and Oriented x 3. pleasant and cooperative. Notes Patient did have slough noted on the wound bed which was sharply debrided utilizing a curette today and she tolerated this without complication. No excessive bleeding noted. Electronic Signature(s) Signed: 11/30/2017 8:05:24 AM By: Lenda KelpStone III, Hoyt PA-C Entered By: Lenda KelpStone III, Hoyt on 11/29/2017 17:01:36 Jessica HindLEATH, [name] ([id number]) -------------------------------------------------------------------------------- Physician Orders Details Patient Name: Jessica CaseyLEATH, [name] Date of Service: 11/29/2017 3:15 PM Medical Record Number: [id number] Patient Account Number: [id number] Date of Birth/Sex: 1981-05-11 (37 y.o. Female) Treating RN: Ashok CordiaPinkerton, Debi Primary Care Provider: PATIENT, NO Other Clinician: Referring Provider: Referral, Self Treating  Provider/Extender: STONE III, HOYT Weeks in Treatment: 11 Verbal / Phone Orders: Yes Clinician: Pinkerton, Debi Read Back and Verified: Yes Diagnosis Coding Wound Cleansing Wound #2 Right,Distal,Lateral Lower Leg o Clean wound with Normal Saline. o Cleanse wound with mild soap and water Anesthetic (add to Medication List) Wound #2 Right,Distal,Lateral Lower Leg o Topical Lidocaine 4% cream applied to wound bed prior to debridement (In Clinic Only). Skin Barriers/Peri-Wound Care Wound #2 Right,Distal,Lateral Lower Leg o Barrier cream o Moisturizing lotion Primary Wound Dressing Wound #2 Right,Distal,Lateral Lower Leg o Hydrafera Blue Secondary Dressing Wound #2 Right,Distal,Lateral Lower Leg o ABD pad o XtraSorb o Other - charcoal Dressing Change  Frequency Wound #2 Right,Distal,Lateral Lower Leg o Dressing is to be changed Monday and Thursday. Follow-up Appointments o Return Appointment in 1 week. o Nurse Visit as needed Edema Control Wound #2 Right,Distal,Lateral Lower Leg o 4-Layer Compression System - Right Lower Extremity - unna to anchor o Elevate legs to the level of the heart and pump ankles as often as possible Additional Orders / Instructions Wound #2 Right,Distal,Lateral Lower Leg o Vitamin A; Vitamin C, Zinc o Increase protein intake. o Other: - Please watch your salt (sodium) intake [name] ([id number]) Patient Medications Allergies: No Known Allergies Notifications Medication Indication Start End lidocaine DOSE 1 - topical 4 % cream - 1 cream topical Electronic Signature(s) Signed: 11/29/2017 4:57:34 PM By: [name] Signed: 11/30/2017 8:05:24 AM By: Lenda Kelp PA-C Entered By: [name] on 11/29/2017 16:08:36 [name] ([id number]) -------------------------------------------------------------------------------- Prescription 11/29/2017 Patient Name: [name] Provider: Lenda Kelp  PA-C Date of Birth: May 20, 1981 NPI#: [id number] Sex: F DEA#: [id number] Phone #: [phone] License #: Patient Address: [facility] [address] [facility] [place], [place] [address] [address], [address] [place], [place] [address] [phone] Allergies No Known Allergies Medication Medication: Route: Strength: Form: lidocaine topical 4% cream Class: TOPICAL LOCAL ANESTHETICS Dose: Frequency / Time: Indication: 1 1 cream topical Number of Refills: Number of Units: 0 Generic Substitution: Start Date: End Date: Administered at Substitution Permitted Facility: Yes Time Administered: Time Discontinued: Note to Pharmacy: Signature(s): Date(s): Electronic Signature(s) Signed: 11/29/2017 4:57:34 PM By: [name] Signed: 11/30/2017 8:05:24 AM By: Lenda Kelp PA-C Entered By: [name] on 11/29/2017 16:08:37 [name] ([id number]) [name] ([id number]) --------------------------------------------------------------------------------  Problem List Details Patient Name: [name] Date of Service: 11/29/2017 3:15 PM Medical Record Number: [id number] Patient Account Number: [id number] Date of Birth/Sex: [birth year] (37 y.o. Female) Treating RN: Ashok Cordia, Debi Primary Care Provider: PATIENT, NO Other Clinician: Referring Provider: Referral, Self Treating Provider/Extender: STONE III, HOYT Weeks in Treatment: 11 Active Problems ICD-10 Encounter Code Description Active Date Diagnosis L97.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d 09/10/2017 Yes I89.0 Lymphedema, not elsewhere classified 09/10/2017 Yes I87.311 Chronic venous hypertension (idiopathic) with ulcer of right lower 09/10/2017 Yes extremity E66.01 Morbid (severe) obesity due to excess calories 09/10/2017 Yes Inactive Problems Resolved Problems Electronic Signature(s) Signed: 11/30/2017 8:05:24 AM  By: Lenda Kelp PA-C Entered By: Lenda Kelp on 11/29/2017 16:14:23 Chovanec, Lamoyne [name]. ([id number]) -------------------------------------------------------------------------------- Progress Note Details Patient Name: [name] Date of Service: 11/29/2017 3:15 PM Medical Record Number: [id number] Patient Account Number: [id number] Date of Birth/Sex: 1981/03/10 (37 y.o. Female) Treating RN: Ashok Cordia, Debi Primary Care Provider: PATIENT, NO Other Clinician: Referring Provider: Referral, Self Treating Provider/Extender: STONE III, HOYT Weeks in Treatment: 11 Subjective Chief Complaint Information obtained from Patient She is here in follow up evaluation of right lower extremity ulcers History of Present Illness (HPI) The following HPI elements were documented for the patient's wound: Location: right lower extremity lateral calf several large areas Quality: Patient reports experiencing a sharp pain to affected area(s). Severity: Patient states wound are getting worse. Duration: Patient has had the wound for < 5 weeks prior to presenting for treatment Timing: Pain in wound is Intermittent (comes and goes Context: The wound would happen gradually Modifying Factors: Other treatment(s) tried include:admission to the hospital for sepsis and a full workup with treatment with IV antibiotics and discharged on oral antibiotics Associated Signs and Symptoms: Patient  reports having increase discharge. 37 year old patient well known to our [facility] wound care clinic where she has been seen since 2016 for bilateral lower extremity venous insufficiency disease with lymphedema and multiple ulcerations associated with morbid obesity. she had custom-made compression stockings and lymphedema pumps which were used in the past. most recently she was admitted to the hospital between October 11 and 09/02/2017 with sepsis, lower extremity wounds and lymphedema.she was initially treated in the  outpatient with Keflex and Bactrim. she was initially treated in the hospital with vancomycin and Zosyn and changed over to Unasyn until her white count improved and her blood cultures were negative for 3 days. After her inpatient management she was discharged home on Augmentin to end on 09/13/2017 with a 14 day course. she has had outpatient vascular duplex scans completed in November 2017 and her right ABI was 1.1 and the left ABI is 1.3. she had normal toe brachial indices bilaterally.she had three-vessel runoff in the right lower extremity and two-vessel runoff in the left lower extremity. On questioning the patient she does have custom made compression stockings and also has a lymphedema pump but has not been using it appropriately and has not been taking good care of herself. 09/17/2017 -- she returns today with compression stockings on the left side and the right side has had significant amount of drainage and has a very strong odor 09/24/2017 -- the drainage is increased significantly and she has more lymphedema and a very strong odor to her wound. Though she does not have systemic symptoms, or overt infection I believe she will benefit from some doxycycline given empirically. 10/01/2017 -- after starting the doxycycline and changing the dressing twice a week her symptoms and signs have definitely improved overall. 10/08/2017 -- she has completed her course of doxycycline but continues to have a lot of drainage and needs twice a week dressing changes. 11/08/17-she is here in follow-up evaluation for right lower extremity ulcers. She admits to using her lymphedema pumps twice daily, one hour per session. she is voicing no complaints or concerns, no signs of infection will change to [facility] [place] [name]. ([id number]) 11/29/17 on evaluation today patient appears to be doing excellent in regard to her right lateral lower extremity wound. There is some Slough noted on the surface of the wound  today but other than that the wound appears to be clear there's no evidence of infection. No fevers, chills, nausea, or vomiting noted at this time. She is having no pain. Patient History Information obtained from Patient. Family History Kidney Disease - Mother, No family history of Cancer, Diabetes, Heart Disease, Hereditary Spherocytosis, Hypertension, Lung Disease, Seizures, Stroke, Thyroid Problems, Tuberculosis. Social History Never smoker, Marital Status - Married, Alcohol Use - Never, Drug Use - No History, Caffeine Use - Daily. Review of Systems (ROS) Constitutional Symptoms (General Health) Denies complaints or symptoms of Fever, Chills. Respiratory The patient has no complaints or symptoms. Cardiovascular Complains or has symptoms of LE edema. Psychiatric The patient has no complaints or symptoms. Objective Constitutional Well-nourished and well-hydrated in no acute distress. Vitals Time Taken: 3:29 PM, Height: 74 in, Weight: 505 lbs, BMI: 64.8, Temperature: 97.9 F, Pulse: 106 bpm, Respiratory Rate: 18 breaths/min, Blood Pressure: 137/83 mmHg. Respiratory normal breathing without difficulty. Psychiatric this patient is able to make decisions and demonstrates good insight into disease process. Alert and Oriented x 3. pleasant and cooperative. General Notes: Patient did have slough noted on the wound bed which was sharply debrided utilizing a curette  today and she tolerated this without complication. No excessive bleeding noted. Integumentary (Hair, Skin) Wound #2 status is Open. Original cause of wound was Gradually Appeared. The wound is located on the Right,Distal,Lateral Lower Leg. The wound measures 2.8cm length x 4.4cm width x 0.1cm depth; 9.676cm^2 area and 0.968cm^3 volume. There is no tunneling or undermining noted. There is medium (34-66%) red granulation within the wound bed. There is a medium (34-66%) amount of necrotic tissue within the wound bed including  Adherent Slough. The periwound skin appearance had no Donnellan, [name] ([id number]) abnormalities noted for color. Periwound temperature was noted as No Abnormality. The periwound has tenderness on palpation. Assessment Active Problems ICD-10 L97.212 - 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d I89.0 - Lymphedema, not elsewhere classified I87.311 - Chronic venous hypertension (idiopathic) with ulcer of right lower extremity E66.01 - Morbid (severe) obesity due to excess calories Procedures Wound #2 Pre-procedure diagnosis of Wound #2 is a Lymphedema located on the Right,Distal,Lateral Lower Leg . There was a Skin/Subcutaneous Tissue Debridement ([id number]) debridement with total area of 12.32 sq cm performed by STONE III, HOYT E., PA-C. with the following instrument(s): Curette to remove Viable and Non-Viable tissue/material including Exudate, Fibrin/Slough, and Subcutaneous after achieving pain control using Lidocaine 4% Topical Solution. A time out was conducted at 16:08, prior to the start of the procedure. A Minimum amount of bleeding was controlled with Pressure. The procedure was tolerated well with a pain level of 0 throughout and a pain level of 0 following the procedure. Post Debridement Measurements: 2.8cm length x 4.4cm width x 0.2cm depth; 1.935cm^3 volume. Character of Wound/Ulcer Post Debridement requires further debridement. Post procedure Diagnosis Wound #2: Same as Pre-Procedure Plan Wound Cleansing: Wound #2 Right,Distal,Lateral Lower Leg: Clean wound with Normal Saline. Cleanse wound with mild soap and water Anesthetic (add to Medication List): Wound #2 Right,Distal,Lateral Lower Leg: Topical Lidocaine 4% cream applied to wound bed prior to debridement (In Clinic Only). Skin Barriers/Peri-Wound Care: Wound #2 Right,Distal,Lateral Lower Leg: Barrier cream Moisturizing lotion Primary Wound Dressing: Wound #2 Right,Distal,Lateral Lower  Leg: Hydrafera Blue Secondary Dressing: Wound #2 Right,Distal,Lateral Lower Leg: ABD pad [place], Arra [name]. ([id number]) XtraSorb Other - charcoal Dressing Change Frequency: Wound #2 Right,Distal,Lateral Lower Leg: Dressing is to be changed Monday and Thursday. Follow-up Appointments: Return Appointment in 1 week. Nurse Visit as needed Edema Control: Wound #2 Right,Distal,Lateral Lower Leg: 4-Layer Compression System - Right Lower Extremity - unna to anchor Elevate legs to the level of the heart and pump ankles as often as possible Additional Orders / Instructions: Wound #2 Right,Distal,Lateral Lower Leg: Vitamin A; Vitamin C, Zinc Increase protein intake. Other: - Please watch your salt (sodium) intake The following medication(s) was prescribed: lidocaine topical 4 % cream 1 1 cream topical was prescribed at facility I'm going to recommend that we continue with the Current wound care measures for the next week. Patient is in agreement with this plan. Hopefully she will continue to do well and show signs of continued improvement. Please see above for specific wound care orders. We will see patient for re-evaluation in 1 week(s) here in the clinic. If anything worsens or changes patient will contact our office for additional recommendations. Electronic Signature(s) Signed: 11/30/2017 8:05:24 AM By: Lenda Kelp PA-C Entered By: Lenda Kelp on 11/29/2017 17:02:05 Jessica Hind ([id number]) -------------------------------------------------------------------------------- ROS/PFSH Details Patient Name: [name] Date of Service: 11/29/2017 3:15 PM Medical Record Number: [id number] Patient Account Number: [id number] Date of Birth/Sex: Feb 13, 1981 (  36 y.o. Female) Treating RN: Ashok Cordia, Debi Primary Care Provider: PATIENT, NO Other Clinician: Referring Provider: Referral, Self Treating Provider/Extender: STONE III, HOYT Weeks in Treatment: 11 Information Obtained  From Patient Wound History Do you currently have one or more open woundso Yes How many open wounds do you currently haveo 1 Approximately how long have you had your woundso 1 month How have you been treating your wound(s) until nowo aquacel ag Has your wound(s) ever healed and then re-openedo No Have you had any lab work done in the past montho No Have you tested positive for an antibiotic resistant organism (MRSA, VRE)o No Have you tested positive for osteomyelitis (bone infection)o No Have you had any tests for circulation on your legso Yes Who ordered the testo [facility] Where was the test doneo [facility] Constitutional Symptoms (General Health) Complaints and Symptoms: Negative for: Fever; Chills Cardiovascular Complaints and Symptoms: Positive for: LE edema Medical History: Positive for: Hypertension Negative for: Angina; Arrhythmia; Congestive Heart Failure; Coronary Artery Disease; Deep Vein Thrombosis; Hypotension; Myocardial Infarction; Peripheral Arterial Disease; Peripheral Venous Disease; Phlebitis; Vasculitis Hematologic/Lymphatic Medical History: Positive for: Lymphedema Respiratory Complaints and Symptoms: No Complaints or Symptoms Medical History: Negative for: Aspiration; Asthma; Chronic Obstructive Pulmonary Disease (COPD); Pneumothorax; Sleep Apnea; Tuberculosis Psychiatric Complaints and Symptoms: No Complaints or Symptoms Lavigne, Tayona [name]. ([id number]) Immunizations Pneumococcal Vaccine: Received Pneumococcal Vaccination: No Immunization Notes: up to date Implantable Devices Family and Social History Cancer: No; Diabetes: No; Heart Disease: No; Hereditary Spherocytosis: No; Hypertension: No; Kidney Disease: Yes - Mother; Lung Disease: No; Seizures: No; Stroke: No; Thyroid Problems: No; Tuberculosis: No; Never smoker; Marital Status - Married; Alcohol Use: Never; Drug Use: No History; Caffeine Use: Daily; Financial Concerns: No; Food, Clothing or Shelter Needs:  No; Support System Lacking: No; Transportation Concerns: No; Advanced Directives: No; Patient does not want information on Advanced Directives Physician Affirmation I have reviewed and agree with the above information. Electronic Signature(s) Signed: 11/30/2017 8:05:24 AM By: Lenda Kelp PA-C Signed: 11/30/2017 4:08:09 PM By: [name] Entered By: Lenda Kelp on 11/29/2017 17:01:23 Boudreau, Tanina [name]. ([id number]) -------------------------------------------------------------------------------- SuperBill Details Patient Name: [name] Date of Service: 11/29/2017 Medical Record Number: [id number] Patient Account Number: [id number] Date of Birth/Sex: 05-Mar-1981 (37 y.o. Female) Treating RN: Ashok Cordia, Debi Primary Care Provider: PATIENT, NO Other Clinician: Referring Provider: Referral, Self Treating Provider/Extender: STONE III, HOYT Weeks in Treatment: 11 Diagnosis Coding ICD-10 Codes Code Description [phone] 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d I89.0 Lymphedema, not elsewhere classified I87.311 Chronic venous hypertension (idiopathic) with ulcer of right lower extremity E66.01 Morbid (severe) obesity due to excess calories Facility Procedures CPT4 Code: [id number] Description: 11042 - DEB SUBQ TISSUE 20 SQ CM/< ICD-10 Diagnosis Description L97.212 Non-pressure chronic ulcer of right calf with fat layer expo Modifier: sed Quantity: 1 Physician Procedures CPT4 Code: [id number] Description: 11042 - WC PHYS SUBQ TISS 20 SQ CM ICD-10 Diagnosis Description L97.212 Non-pressure chronic ulcer of right calf with fat layer expo Modifier: sed Quantity: 1 Electronic Signature(s) Signed: 11/30/2017 8:05:24 AM By: Lenda Kelp PA-C Entered By: Lenda Kelp on 11/29/2017 17:02:15

## 2017-12-03 ENCOUNTER — Ambulatory Visit: Payer: Self-pay

## 2017-12-07 ENCOUNTER — Encounter: Payer: Self-pay | Admitting: Physician Assistant

## 2017-12-10 NOTE — Progress Notes (Signed)
ALPHA, CHOUINARD ([id number]) Visit Report for 12/07/2017 Chief Complaint Document Details Patient Name: [name], [name] Date of Service: 12/07/2017 9:45 AM Medical Record Number: [id number] Patient Account Number: [id number] Date of Birth/Sex: 20-Aug-1981 (37 y.o. Female) Treating RN: Ashok Cordia, Debi Primary Care Provider: PATIENT, NO Other Clinician: Referring Provider: Referral, Self Treating Provider/Extender: STONE III, Sahalie Beth Weeks in Treatment: 12 Information Obtained from: Patient Chief Complaint She is here in follow up evaluation of right lower extremity ulcers Electronic Signature(s) Signed: 12/10/2017 11:19:49 AM By: Lenda Kelp PA-C Entered By: Lenda Kelp on 12/07/2017 10:13:36 Eberlein, Falecia R. ([id number]) -------------------------------------------------------------------------------- Debridement Details Patient Name: Jessica Casey R. Date of Service: 12/07/2017 9:45 AM Medical Record Number: [id number] Patient Account Number: [id number] Date of Birth/Sex: 11/22/1980 (37 y.o. Female) Treating RN: Ashok Cordia, Debi Primary Care Provider: PATIENT, NO Other Clinician: Referring Provider: Referral, Self Treating Provider/Extender: STONE III, Elaysia Devargas Weeks in Treatment: 12 Debridement Performed for Wound #2 Right,Distal,Lateral Lower Leg Assessment: Performed By: Physician STONE III, Antario Yasuda E., PA-C Debridement: Debridement Pre-procedure Verification/Time Yes - 10:15 Out Taken: Start Time: 10:16 Pain Control: Lidocaine 4% Topical Solution Level: Skin/Subcutaneous Tissue Total Area Debrided (L x W): 2.8 (cm) x 4.4 (cm) = 12.32 (cm) Tissue and other material Viable, Non-Viable, Exudate, Fibrin/Slough, Subcutaneous debrided: Instrument: Curette Bleeding: Minimum Hemostasis Achieved: Pressure End Time: 10:20 Procedural Pain: 0 Post Procedural Pain: 0 Response to Treatment: Procedure was tolerated well Post Debridement Measurements of Total Wound Length: (cm) 2.8 Width:  (cm) 4.4 Depth: (cm) 0.2 Volume: (cm) 1.935 Character of Wound/Ulcer Post Debridement: Requires Further Debridement Post Procedure Diagnosis Same as Pre-procedure Electronic Signature(s) Signed: 12/07/2017 5:30:48 PM By: [name] Signed: 12/10/2017 11:19:49 AM By: Lenda Kelp PA-C Entered By: [name] on 12/07/2017 10:20:50 Eckerson, Batya R. ([id number]) -------------------------------------------------------------------------------- HPI Details Patient Name: Jessica Casey R. Date of Service: 12/07/2017 9:45 AM Medical Record Number: [id number] Patient Account Number: [id number] Date of Birth/Sex: 04-05-1981 (37 y.o. Female) Treating RN: Ashok Cordia, Debi Primary Care Provider: PATIENT, NO Other Clinician: Referring Provider: Referral, Self Treating Provider/Extender: STONE III, Adelise Buswell Weeks in Treatment: 12 History of Present Illness HPI Description: 37 year old patient well known to our [facility] wound care clinic where she has been seen since 2016 for bilateral lower extremity venous insufficiency disease with lymphedema and multiple ulcerations associated with morbid obesity. she had custom-made compression stockings and lymphedema pumps which were used in the past. most recently she was admitted to the hospital between October 11 and 09/02/2017 with sepsis, lower extremity wounds and lymphedema.she was initially treated in the outpatient with Keflex and Bactrim. she was initially treated in the hospital with vancomycin and Zosyn and changed over to Unasyn until her white count improved and her blood cultures were negative for 3 days. After her inpatient management she was discharged home on Augmentin to end on 09/13/2017 with a 14 day course. she has had outpatient vascular duplex scans completed in November 2017 and her right ABI was 1.1 and the left ABI is 1.3. she had normal toe brachial indices bilaterally.she had three-vessel runoff in the right lower extremity and  two-vessel runoff in the left lower extremity. On questioning the patient she does have custom made compression stockings and also has a lymphedema pump but has not been using it appropriately and has not been taking good care of herself. 09/17/2017 -- she returns today with compression stockings on the left side and the right side has had significant amount of drainage and has a very strong odor 09/24/2017 -- the  drainage is increased significantly and she has more lymphedema and a very strong odor to her wound. Though she does not have systemic symptoms, or overt infection I believe she will benefit from some doxycycline given empirically. 10/01/2017 -- after starting the doxycycline and changing the dressing twice a week her symptoms and signs have definitely improved overall. 10/08/2017 -- she has completed her course of doxycycline but continues to have a lot of drainage and needs twice a week dressing changes. 11/08/17-she is here in follow-up evaluation for right lower extremity ulcers. She admits to using her lymphedema pumps twice daily, one hour per session. she is voicing no complaints or concerns, no signs of infection [facility] on evaluation today patient appears to be doing well in regard to her right lower extremity ulcers. She has been tolerating the dressing changes without complication. In fact the wraps are doing very well for her. Overall the wounds are showing excellent improvement and I'm pleased with the progress that she has made. She has [facility] the [facility] excellent Electronic Signature(s) Signed: 12/10/2017 11:19:49 AM By: Lenda KelpStone III, Mckayla Mulcahey PA-C Entered By: Lenda KelpStone III, Brittnee Gaetano on 12/07/2017 10:24:57 Sirico, Cristal DeerERICA R. ([id number]) -------------------------------------------------------------------------------- Physical Exam Details Patient Name: Jessica CaseyLEATH, [name] Date of Service: 12/07/2017 9:45 AM Medical Record Number: [id number] Patient  Account Number: [id number] Date of Birth/Sex: [birth year] (37 y.o. Female) Treating RN: Ashok CordiaPinkerton, Debi Primary Care Provider: PATIENT, NO Other Clinician: Referring Provider: Referral, Self Treating Provider/Extender: STONE III, Lekisha Mcghee Weeks in Treatment: 12 Constitutional Well-nourished and well-hydrated in no acute distress. Respiratory normal breathing without difficulty. Psychiatric this patient is able to make decisions and demonstrates good insight into disease process. Alert and Oriented x 3. pleasant and cooperative. Notes Patient's wound bed does show some Slough on the surface and this will did require sharp debridement today which he tolerated fairly well with only minimal discomfort. Post debridement both wounds appear to be doing better especially the upper portion of the wound. There's no evidence of infection at this time. Debridement was performed utilizing a curette. Electronic Signature(s) Signed: 12/10/2017 11:19:49 AM By: Lenda KelpStone III, Toye Rouillard PA-C Entered By: Lenda KelpStone III, Carlee Tesfaye on 12/07/2017 10:25:52 Marlaine HindLEATH, Lauriana R. ([id number]) -------------------------------------------------------------------------------- Physician Orders Details Patient Name: Jessica CaseyLEATH, Alohilani R. Date of Service: 12/07/2017 9:45 AM Medical Record Number: [id number] Patient Account Number: [id number] Date of Birth/Sex: [birth year] (37 y.o. Female) Treating RN: Ashok CordiaPinkerton, Debi Primary Care Provider: PATIENT, NO Other Clinician: Referring Provider: Referral, Self Treating Provider/Extender: STONE III, Pierre Cumpton Weeks in Treatment: 12 Verbal / Phone Orders: Yes Clinician: Ashok CordiaPinkerton, Debi Read Back and Verified: Yes Diagnosis Coding ICD-10 Coding Code Description 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d I89.0 Lymphedema, not elsewhere classified I87.311 Chronic venous hypertension (idiopathic) with ulcer of right lower extremity E66.01 Morbid (severe) obesity due to excess  calories Wound Cleansing Wound #2 Right,Distal,Lateral Lower Leg o Clean wound with Normal Saline. o Cleanse wound with mild soap and water Anesthetic (add to Medication List) Wound #2 Right,Distal,Lateral Lower Leg o Topical Lidocaine 4% cream applied to wound bed prior to debridement (In Clinic Only). Skin Barriers/Peri-Wound Care Wound #2 Right,Distal,Lateral Lower Leg o Barrier cream o Moisturizing lotion Primary Wound Dressing Wound #2 Right,Distal,Lateral Lower Leg o Hydrafera Blue Secondary Dressing Wound #2 Right,Distal,Lateral Lower Leg o ABD pad o XtraSorb o Other - charcoal Dressing Change Frequency Wound #2 Right,Distal,Lateral Lower Leg o Dressing is to be changed Monday and Thursday. Follow-up Appointments o Return Appointment in 1 week. o  Nurse Visit as needed Edema Control Wound #2 Right,Distal,Lateral Lower Leg Serpas, Tamia R. ([id number]) o 4-Layer Compression System - Right Lower Extremity - unna to anchor o Elevate legs to the level of the heart and pump ankles as often as possible Additional Orders / Instructions Wound #2 Right,Distal,Lateral Lower Leg o Vitamin A; Vitamin C, Zinc o Increase protein intake. o Other: - Please watch your salt (sodium) intake Patient Medications Allergies: No Known Allergies Notifications Medication Indication Start End lidocaine DOSE 1 - topical 4 % cream - 1 cream topical Electronic Signature(s) Signed: 12/07/2017 5:30:48 PM By: [name] Signed: 12/10/2017 11:19:49 AM By: Lenda Kelp PA-C Entered By: [name] on 12/07/2017 10:16:10 [name] ([id number]) -------------------------------------------------------------------------------- Prescription 12/07/2017 Patient Name: Jessica Casey R. Provider: Lenda Kelp PA-C Date of Birth: [birth year] NPI#: [id number] Sex: F DEA#: [id number] Phone #: [phone] License #: Patient Address: [facility] [address] [facility] [place], [place] [address] [address], [address] [place], [place] [address] [phone] Allergies No Known Allergies Medication Medication: Route: Strength: Form: lidocaine topical 4% cream Class: TOPICAL LOCAL ANESTHETICS Dose: Frequency / Time: Indication: 1 1 cream topical Number of Refills: Number of Units: 0 Generic Substitution: Start Date: End Date: Administered at Substitution Permitted Facility: Yes Time Administered: Time Discontinued: Note to Pharmacy: Signature(s): Date(s): Electronic Signature(s) Signed: 12/07/2017 5:30:48 PM By: [name] Signed: 12/10/2017 11:19:49 AM By: Lenda Kelp PA-C Entered By: [name] on 12/07/2017 10:16:11 Sibal, Bryleigh R. ([id number]) Donegan, Hermione R. ([id number]) --------------------------------------------------------------------------------  Problem List Details Patient Name: Jessica Casey R. Date of Service: 12/07/2017 9:45 AM Medical Record Number: [id number] Patient Account Number: [id number] Date of Birth/Sex: Oct 21, 1981 (37 y.o. Female) Treating RN: Ashok Cordia, Debi Primary Care Provider: PATIENT, NO Other Clinician: Referring Provider: Referral, Self Treating Provider/Extender: STONE III, Sumer Moorehouse Weeks in Treatment: 12 Active Problems ICD-10 Encounter Code Description Active Date Diagnosis L97.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d 09/10/2017 Yes I89.0 Lymphedema, not elsewhere classified 09/10/2017 Yes I87.311 Chronic venous hypertension (idiopathic) with ulcer of right lower 09/10/2017 Yes extremity E66.01 Morbid (severe) obesity due to excess calories 09/10/2017 Yes Inactive Problems Resolved Problems Electronic Signature(s) Signed: 12/10/2017 11:19:49 AM By: Lenda Kelp PA-C Entered By: Lenda Kelp on 12/07/2017 10:13:25 Slaydon, Lekeisha R.  ([id number]) -------------------------------------------------------------------------------- Progress Note Details Patient Name: Jessica Casey R. Date of Service: 12/07/2017 9:45 AM Medical Record Number: [id number] Patient Account Number: [id number] Date of Birth/Sex: Apr 20, 1981 (37 y.o. Female) Treating RN: Ashok Cordia, Debi Primary Care Provider: PATIENT, NO Other Clinician: Referring Provider: Referral, Self Treating Provider/Extender: STONE III, Laniah Grimm Weeks in Treatment: 12 Subjective Chief Complaint Information obtained from Patient She is here in follow up evaluation of right lower extremity ulcers History of Present Illness (HPI) 37 year old patient well known to our [facility] wound care clinic where she has been seen since 2016 for bilateral lower extremity venous insufficiency disease with lymphedema and multiple ulcerations associated with morbid obesity. she had custom-made compression stockings and lymphedema pumps which were used in the past. most recently she was admitted to the hospital between October 11 and 09/02/2017 with sepsis, lower extremity wounds and lymphedema.she was initially treated in the outpatient with Keflex and Bactrim. she was initially treated in the hospital with vancomycin and Zosyn and changed over to Unasyn until her white count improved and her blood cultures were negative for 3 days. After her inpatient management she was discharged home on Augmentin to end on  09/13/2017 with a 14 day course. she has had outpatient vascular duplex scans completed in November 2017 and her right ABI was 1.1 and the left ABI is 1.3. she had normal toe brachial indices bilaterally.she had three-vessel runoff in the right lower extremity and two-vessel runoff in the left lower extremity. On questioning the patient she does have custom made compression stockings and also has a lymphedema pump but has not been using it appropriately and has not been taking good care of  herself. 09/17/2017 -- she returns today with compression stockings on the left side and the right side has had significant amount of drainage and has a very strong odor 09/24/2017 -- the drainage is increased significantly and she has more lymphedema and a very strong odor to her wound. Though she does not have systemic symptoms, or overt infection I believe she will benefit from some doxycycline given empirically. 10/01/2017 -- after starting the doxycycline and changing the dressing twice a week her symptoms and signs have definitely improved overall. 10/08/2017 -- she has completed her course of doxycycline but continues to have a lot of drainage and needs twice a week dressing changes. 11/08/17-she is here in follow-up evaluation for right lower extremity ulcers. She admits to using her lymphedema pumps twice daily, one hour per session. she is voicing no complaints or concerns, no signs of infection will change to [facility] 12/07/17 on evaluation today patient appears to be doing well in regard to her right lower extremity ulcers. She has been tolerating the dressing changes without complication. In fact the wraps are doing very well for her. Overall the wounds are showing excellent improvement and I'm pleased with the progress that she has made. She has tolerated the Hydrofera Blue Dressing excellent Patient History Information obtained from Patient. Family History [name] Disease - Mother, LATANA, COLIN ([id number]) No family history of Cancer, Diabetes, Heart Disease, Hereditary Spherocytosis, Hypertension, Lung Disease, Seizures, Stroke, Thyroid Problems, Tuberculosis. Social History Never smoker, Marital Status - Married, Alcohol Use - Never, Drug Use - No History, Caffeine Use - Daily. Review of Systems (ROS) Constitutional Symptoms (General Health) Denies complaints or symptoms of Fever, Chills. Respiratory The patient has no complaints or symptoms. Cardiovascular Complains or  has symptoms of LE edema. Objective Constitutional Well-nourished and well-hydrated in no acute distress. Vitals Time Taken: 10:02 AM, Height: 74 in, Weight: 505 lbs, BMI: 64.8, Temperature: 97.9 F, Pulse: 82 bpm, Respiratory Rate: 18 breaths/min, Blood Pressure: 121/96 mmHg. Respiratory normal breathing without difficulty. Psychiatric this patient is able to make decisions and demonstrates good insight into disease process. Alert and Oriented x 3. pleasant and cooperative. General Notes: Patient's wound bed does show some Slough on the surface and this will did require sharp debridement today which he tolerated fairly well with only minimal discomfort. Post debridement both wounds appear to be doing better especially the upper portion of the wound. There's no evidence of infection at this time. Debridement was performed utilizing a curette. Integumentary (Hair, Skin) Wound #2 status is Open. Original cause of wound was Gradually Appeared. The wound is located on the Right,Distal,Lateral Lower Leg. The wound measures 2.8cm length x 4.4cm width x 0.1cm depth; 9.676cm^2 area and 0.968cm^3 volume. There is no tunneling or undermining noted. There is a large amount of serous drainage noted. The wound margin is flat and intact. There is large (67-100%) red granulation within the wound bed. There is a small (1-33%) amount of necrotic tissue within the wound bed including Adherent Slough. The  periwound skin appearance had no abnormalities noted for color. Periwound temperature was noted as No Abnormality. The periwound has tenderness on palpation. Assessment Ciancio, Nailah R. ([id number]) Active Problems ICD-10 [phone] - 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d I89.0 - Lymphedema, not elsewhere classified I87.311 - Chronic venous hypertension (idiopathic) with ulcer of right lower extremity E66.01 - Morbid (severe) obesity due to excess calories Procedures Wound  #2 Pre-procedure diagnosis of Wound #2 is a Lymphedema located on the Right,Distal,Lateral Lower Leg . There was a Skin/Subcutaneous Tissue Debridement ([id number]) debridement with total area of 12.32 sq cm performed by STONE III, Kately Graffam E., PA-C. with the following instrument(s): Curette to remove Viable and Non-Viable tissue/material including Exudate, Fibrin/Slough, and Subcutaneous after achieving pain control using Lidocaine 4% Topical Solution. A time out was conducted at 10:15, prior to the start of the procedure. A Minimum amount of bleeding was controlled with Pressure. The procedure was tolerated well with a pain level of 0 throughout and a pain level of 0 following the procedure. Post Debridement Measurements: 2.8cm length x 4.4cm width x 0.2cm depth; 1.935cm^3 volume. Character of Wound/Ulcer Post Debridement requires further debridement. Post procedure Diagnosis Wound #2: Same as Pre-Procedure Plan Wound Cleansing: Wound #2 Right,Distal,Lateral Lower Leg: Clean wound with Normal Saline. Cleanse wound with mild soap and water Anesthetic (add to Medication List): Wound #2 Right,Distal,Lateral Lower Leg: Topical Lidocaine 4% cream applied to wound bed prior to debridement (In Clinic Only). Skin Barriers/Peri-Wound Care: Wound #2 Right,Distal,Lateral Lower Leg: Barrier cream Moisturizing lotion Primary Wound Dressing: Wound #2 Right,Distal,Lateral Lower Leg: Hydrafera Blue Secondary Dressing: Wound #2 Right,Distal,Lateral Lower Leg: ABD pad XtraSorb Other - charcoal Dressing Change Frequency: Wound #2 Right,Distal,Lateral Lower Leg: Dressing is to be changed Monday and Thursday. Follow-up Appointments: Return Appointment in 1 week. Nurse Visit as needed [place], JOLIN ([id number]) Edema Control: Wound #2 Right,Distal,Lateral Lower Leg: 4-Layer Compression System - Right Lower Extremity - unna to anchor Elevate legs to the level of the heart and pump ankles as often as  possible Additional Orders / Instructions: Wound #2 Right,Distal,Lateral Lower Leg: Vitamin A; Vitamin C, Zinc Increase protein intake. Other: - Please watch your salt (sodium) intake The following medication(s) was prescribed: lidocaine topical 4 % cream 1 1 cream topical was prescribed at facility I am going to recommend that we continue with the Current wound care measures as she seems to be making excellent progress at this point patient is in agreement with the plan. We will see were things stand in one weeks time. Please see above for specific wound care orders. We will see patient for re-evaluation in 1 week(s) here in the clinic. If anything worsens or changes patient will contact our office for additional recommendations. Electronic Signature(s) Signed: 12/10/2017 11:19:49 AM By: Lenda Kelp PA-C Entered By: Lenda Kelp on 12/07/2017 10:28:51 [name] ([id number]) -------------------------------------------------------------------------------- ROS/PFSH Details Patient Name: Jessica Casey R. Date of Service: 12/07/2017 9:45 AM Medical Record Number: [id number] Patient Account Number: [id number] Date of Birth/Sex: 03-Oct-1981 (38 y.o. Female) Treating RN: Ashok Cordia, Debi Primary Care Provider: PATIENT, NO Other Clinician: Referring Provider: Referral, Self Treating Provider/Extender: STONE III, Karletta Millay Weeks in Treatment: 12 Information Obtained From Patient Wound History Do you currently have one or more open woundso Yes How many open wounds do you currently haveo 1 Approximately how long have you had your woundso 1 month How have you been treating your wound(s) until nowo aquacel ag Has your wound(s) ever healed and then  re-openedo No Have you had any lab work done in the past montho No Have you tested positive for an antibiotic resistant organism (MRSA, VRE)o No Have you tested positive for osteomyelitis (bone infection)o No Have you had any tests for circulation  on your legso Yes Who ordered the testo [facility] Where was the test doneo [facility] Constitutional Symptoms (General Health) Complaints and Symptoms: Negative for: Fever; Chills Cardiovascular Complaints and Symptoms: Positive for: LE edema Medical History: Positive for: Hypertension Negative for: Angina; Arrhythmia; Congestive Heart Failure; Coronary Artery Disease; Deep Vein Thrombosis; Hypotension; Myocardial Infarction; Peripheral Arterial Disease; Peripheral Venous Disease; Phlebitis; Vasculitis Hematologic/Lymphatic Medical History: Positive for: Lymphedema Respiratory Complaints and Symptoms: No Complaints or Symptoms Medical History: Negative for: Aspiration; Asthma; Chronic Obstructive Pulmonary Disease (COPD); Pneumothorax; Sleep Apnea; Tuberculosis Immunizations Pneumococcal Vaccine: Received Pneumococcal Vaccination: No Immunization NotesSKYLYNN, BURKLEY ([id number]) up to date Implantable Devices Family and Social History Cancer: No; Diabetes: No; Heart Disease: No; Hereditary Spherocytosis: No; Hypertension: No; [name] Disease: Yes - Mother; Lung Disease: No; Seizures: No; Stroke: No; Thyroid Problems: No; Tuberculosis: No; Never smoker; Marital Status - Married; Alcohol Use: Never; Drug Use: No History; Caffeine Use: Daily; Financial Concerns: No; Food, Clothing or Shelter Needs: No; Support System Lacking: No; Transportation Concerns: No; Advanced Directives: No; Patient does not want information on Advanced Directives Physician Affirmation I have reviewed and agree with the above information. Electronic Signature(s) Signed: 12/07/2017 5:30:48 PM By: [name] Signed: 12/10/2017 11:19:49 AM By: Lenda Kelp PA-C Entered By: Lenda Kelp on 12/07/2017 10:25:20 Mato, Cristal Deer ([id number]) -------------------------------------------------------------------------------- SuperBill Details Patient Name: Jessica Casey R. Date of Service: 12/07/2017 Medical  Record Number: [id number] Patient Account Number: [id number] Date of Birth/Sex: 1981-09-20 (37 y.o. Female) Treating RN: Ashok Cordia, Debi Primary Care Provider: PATIENT, NO Other Clinician: Referring Provider: Referral, Self Treating Provider/Extender: STONE III, Arye Weyenberg Weeks in Treatment: 12 Diagnosis Coding ICD-10 Codes Code Description [phone] 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d I89.0 Lymphedema, not elsewhere classified I87.311 Chronic venous hypertension (idiopathic) with ulcer of right lower extremity E66.01 Morbid (severe) obesity due to excess calories Facility Procedures CPT4 Code: 46962952 Description: 11042 - DEB SUBQ TISSUE 20 SQ CM/< ICD-10 Diagnosis Description L97.212 Non-pressure chronic ulcer of right calf with fat layer expo Modifier: sed Quantity: 1 Physician Procedures CPT4 Code: [id number] Description: 11042 - WC PHYS SUBQ TISS 20 SQ CM ICD-10 Diagnosis Description 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

## 2017-12-10 NOTE — Progress Notes (Signed)
MONA, AYARS ([id number]) Visit Report for 12/07/2017 Arrival Information Details Patient Name: [name] Date of Service: 12/07/2017 9:45 AM Medical Record Number: [id number] Patient Account Number: [id number] Date of Birth/Sex: 09/05/1981 (37 y.o. Female) Treating RN: Ashok Cordia, Debi Primary Care Kana Reimann: PATIENT, NO Other Clinician: Referring Keiva Dina: Referral, Self Treating Haidynn Almendarez/Extender: STONE III, HOYT Weeks in Treatment: 12 Visit Information History Since Last Visit All ordered tests and consults were completed: No Patient Arrived: Ambulatory Added or deleted any medications: No Arrival Time: 10:01 Any new allergies or adverse reactions: No Accompanied By: self Had a fall or experienced change in No Transfer Assistance: None activities of daily living that may affect Patient Identification Verified: Yes risk of falls: Secondary Verification Process Completed: Yes Signs or symptoms of abuse/neglect since last visito No Patient Requires Transmission-Based No Hospitalized since last visit: No Precautions: Has Dressing in Place as Prescribed: Yes Patient Has Alerts: No Has Compression in Place as Prescribed: Yes Pain Present Now: No Electronic Signature(s) Signed: 12/07/2017 5:30:48 PM By: [name] Entered By: [name] on 12/07/2017 10:01:53 [name] ([id number]) -------------------------------------------------------------------------------- Encounter Discharge Information Details Patient Name: [name] R. Date of Service: 12/07/2017 9:45 AM Medical Record Number: [id number] Patient Account Number: [id number] Date of Birth/Sex: [birth year] (37 y.o. Female) Treating RN: Ashok Cordia, Debi Primary Care Arthurine Oleary: PATIENT, NO Other Clinician: Referring Josefine Fuhr: Referral, Self Treating Vanissa Strength/Extender: STONE III, HOYT Weeks in Treatment: 12 Encounter Discharge Information Items Discharge Pain Level: 0 Discharge Condition:  Stable Ambulatory Status: Ambulatory Discharge Destination: Home Transportation: Private Auto Accompanied By: self Schedule Follow-up Appointment: Yes Medication Reconciliation completed and No provided to Patient/Care Chasyn Cinque: Provided on Clinical Summary of Care: 12/07/2017 Form Type Recipient Paper Patient EL Electronic Signature(s) Signed: 12/10/2017 10:40:19 AM By: Gwenlyn Perking Entered By: Gwenlyn Perking on 12/07/2017 10:35:31 [name], [name] ([id number]) -------------------------------------------------------------------------------- Lower Extremity Assessment Details Patient Name: [name] R. Date of Service: 12/07/2017 9:45 AM Medical Record Number: [id number] Patient Account Number: [id number] Date of Birth/Sex: 1981-10-13 (37 y.o. Female) Treating RN: Ashok Cordia, Debi Primary Care Damauri Minion: PATIENT, NO Other Clinician: Referring Lochlin Eppinger: Referral, Self Treating Shaquan Puerta/Extender: STONE III, HOYT Weeks in Treatment: 12 Edema Assessment Assessed: [Left: No] [Right: No] [Left: Edema] [Right: :] Calf Left: Right: Point of Measurement: 35 cm From Medial Instep cm 58.6 cm Ankle Left: Right: Point of Measurement: 9 cm From Medial Instep cm 36.2 cm Vascular Assessment Pulses: Dorsalis Pedis Palpable: [Right:Yes] Posterior Tibial Extremity colors, hair growth, and conditions: Extremity Color: [Right:Hyperpigmented] Hair Growth on Extremity: [Right:Yes] Temperature of Extremity: [Right:Warm] Capillary Refill: [Right:< 3 seconds] Electronic Signature(s) Signed: 12/07/2017 5:30:48 PM By: [name] Entered By: [name] on 12/07/2017 10:09:30 [name], [name] ([id number]) -------------------------------------------------------------------------------- Multi Wound Chart Details Patient Name: [name] R. Date of Service: 12/07/2017 9:45 AM Medical Record Number: [id number] Patient Account Number: [id number] Date of Birth/Sex: 09/04/1981 (37 y.o.  Female) Treating RN: Ashok Cordia, Debi Primary Care Markelle Najarian: PATIENT, NO Other Clinician: Referring Jatorian Renault: Referral, Self Treating Bhavik Cabiness/Extender: STONE III, HOYT Weeks in Treatment: 12 Vital Signs Height(in): 74 Pulse(bpm): 82 Weight(lbs): 505 Blood Pressure(mmHg): 121/96 Body Mass Index(BMI): 65 Temperature(F): 97.9 Respiratory Rate 18 (breaths/min): Photos: [2:No Photos] [N/A:N/A] Wound Location: [2:Right Lower Leg - Lateral, Distal] [N/A:N/A] Wounding Event: [2:Gradually Appeared] [N/A:N/A] Primary Etiology: [2:Lymphedema] [N/A:N/A] Comorbid History: [2:Lymphedema, Hypertension] [N/A:N/A] Date Acquired: [2:08/08/2017] [N/A:N/A] Weeks of Treatment: [2:12] [N/A:N/A] Wound Status: [2:Open] [N/A:N/A] Measurements L x W x D [2:2.8x4.4x0.1] [N/A:N/A] (cm) Area (cm) : [2:9.676] [N/A:N/A] Volume (cm) : [2:0.968] [N/A:N/A] % Reduction in Area: [2:54.90%] [  N/A:N/A] % Reduction in Volume: [2:85.00%] [N/A:N/A] Classification: [2:Full Thickness With Exposed Support Structures] [N/A:N/A] Exudate Amount: [2:Large] [N/A:N/A] Exudate Type: [2:Serous] [N/A:N/A] Exudate Color: [2:amber] [N/A:N/A] Wound Margin: [2:Flat and Intact] [N/A:N/A] Granulation Amount: [2:Large (67-100%)] [N/A:N/A] Granulation Quality: [2:Red] [N/A:N/A] Necrotic Amount: [2:Small (1-33%)] [N/A:N/A] Exposed Structures: [2:Fascia: No Fat Layer (Subcutaneous Tissue) Exposed: No Tendon: No Muscle: No Joint: No Bone: No] [N/A:N/A] Epithelialization: [2:Small (1-33%)] [N/A:N/A] Periwound Skin Texture: [2:No Abnormalities Noted] [N/A:N/A] Periwound Skin Moisture: [2:No Abnormalities Noted] [N/A:N/A] Periwound Skin Color: [2:No Abnormalities Noted] [N/A:N/A] Temperature: [2:No Abnormality] [N/A:N/A] Tenderness on Palpation: [2:Yes] [N/A:N/A] Wound Preparation: Ulcer Cleansing: N/A N/A Rinsed/Irrigated with Saline, Other: soap and water Topical Anesthetic Applied: Other: lidocaine 4% Treatment  Notes Electronic Signature(s) Signed: 12/07/2017 5:30:48 PM By: Alejandro MullingPinkerton, Debra Entered By: Alejandro MullingPinkerton, Debra on 12/07/2017 10:10:41 Jessica HindLEATH, Aleathia R. ([id number]) -------------------------------------------------------------------------------- Multi-Disciplinary Care Plan Details Patient Name: [name], [name] Date of Service: 12/07/2017 9:45 AM Medical Record Number: [id number] Patient Account Number: [id number] Date of Birth/Sex: 1981-07-17 (37 y.o. Female) Treating RN: Ashok CordiaPinkerton, Debi Primary Care Francine Hannan: PATIENT, NO Other Clinician: Referring Prisma Decarlo: Referral, Self Treating Gatlin Kittell/Extender: STONE III, HOYT Weeks in Treatment: 12 Active Inactive ` Orientation to the Wound Care Program Nursing Diagnoses: Knowledge deficit related to the wound healing center program Goals: Patient/caregiver will verbalize understanding of the Wound Healing Center Program Date Initiated: 09/10/2017 Target Resolution Date: 11/23/2017 Goal Status: Active Interventions: Provide education on orientation to the wound center Notes: ` Venous Leg Ulcer Nursing Diagnoses: Knowledge deficit related to disease process and management Goals: Patient/caregiver will verbalize understanding of disease process and disease management Date Initiated: 09/10/2017 Target Resolution Date: 11/23/2017 Goal Status: Active Interventions: Assess peripheral edema status every visit. Notes: ` Wound/Skin Impairment Nursing Diagnoses: Impaired tissue integrity Goals: Ulcer/skin breakdown will heal within 14 weeks Date Initiated: 09/10/2017 Target Resolution Date: 11/24/2017 Goal Status: Active Interventions: Jessica HindLEATH, [name] ([id number]) Assess patient/caregiver ability to obtain necessary supplies Assess patient/caregiver ability to perform ulcer/skin care regimen upon admission and as needed Assess ulceration(s) every visit Notes: Electronic Signature(s) Signed: 12/07/2017 5:30:48 PM By: Alejandro MullingPinkerton, Debra Entered  By: Alejandro MullingPinkerton, Debra on 12/07/2017 10:10:33 Saefong, Carter R. ([id number]) -------------------------------------------------------------------------------- Pain Assessment Details Patient Name: [name], [name] Date of Service: 12/07/2017 9:45 AM Medical Record Number: [id number] Patient Account Number: [id number] Date of Birth/Sex: 1981-07-17 (37 y.o. Female) Treating RN: Ashok CordiaPinkerton, Debi Primary Care Tiersa Dayley: PATIENT, NO Other Clinician: Referring Jolonda Gomm: Referral, Self Treating Vernica Wachtel/Extender: STONE III, HOYT Weeks in Treatment: 12 Active Problems Location of Pain Severity and Description of Pain Patient Has Paino No Site Locations Pain Management and Medication Current Pain Management: Electronic Signature(s) Signed: 12/07/2017 5:30:48 PM By: Alejandro MullingPinkerton, Debra Entered By: Alejandro MullingPinkerton, Debra on 12/07/2017 10:01:59 Jessica HindLEATH, [name] ([id number]) -------------------------------------------------------------------------------- Patient/Caregiver Education Details Patient Name: [name], [name] Date of Service: 12/07/2017 9:45 AM Medical Record Number: [id number] Patient Account Number: [id number] Date of Birth/Gender: 1981-07-17 (37 y.o. Female) Treating RN: Ashok CordiaPinkerton, Debi Primary Care Physician: PATIENT, NO Other Clinician: Referring Physician: Referral, Self Treating Physician/Extender: Linwood DibblesSTONE III, HOYT Weeks in Treatment: 12 Education Assessment Education Provided To: Patient Education Topics Provided Wound/Skin Impairment: Handouts: Caring for Your Ulcer, Other: change dressing as ordered Methods: Demonstration, Explain/Verbal Responses: State content correctly Electronic Signature(s) Signed: 12/07/2017 5:30:48 PM By: Alejandro MullingPinkerton, Debra Entered By: Alejandro MullingPinkerton, Debra on 12/07/2017 10:21:57 [name], [name] ([id number]) -------------------------------------------------------------------------------- Wound Assessment Details Patient Name: [name], [name] Date of Service: 12/07/2017 9:45  AM Medical Record Number: [id number] Patient Account Number: [id number] Date of Birth/Sex: 1981-07-17 (37 y.o. Female) Treating RN:  Ashok Cordia, Debi Primary Care Babette Stum: PATIENT, NO Other Clinician: Referring Sevastian Witczak: Referral, Self Treating Koralyn Prestage/Extender: STONE III, HOYT Weeks in Treatment: 12 Wound Status Wound Number: 2 Primary Etiology: Lymphedema Wound Location: Right Lower Leg - Lateral, Distal Wound Status: Open Wounding Event: Gradually Appeared Comorbid History: Lymphedema, Hypertension Date Acquired: 08/08/2017 Weeks Of Treatment: 12 Clustered Wound: No Photos Photo Uploaded By: [name] on 12/07/2017 17:42:55 Wound Measurements Length: (cm) 2.8 Width: (cm) 4.4 Depth: (cm) 0.1 Area: (cm) 9.676 Volume: (cm) 0.968 % Reduction in Area: 54.9% % Reduction in Volume: 85% Epithelialization: Small (1-33%) Tunneling: No Undermining: No Wound Description Full Thickness With Exposed Support Classification: Structures Wound Margin: Flat and Intact Exudate Large Amount: Exudate Type: Serous Exudate Color: amber Foul Odor After Cleansing: No Slough/Fibrino Yes Wound Bed Granulation Amount: Large (67-100%) Exposed Structure Granulation Quality: Red Fascia Exposed: No Necrotic Amount: Small (1-33%) Fat Layer (Subcutaneous Tissue) Exposed: No Necrotic Quality: Adherent Slough Tendon Exposed: No Muscle Exposed: No Joint Exposed: No Bone Exposed: No [name], [name] ([id number]) Periwound Skin Texture Texture Color No Abnormalities Noted: No No Abnormalities Noted: Yes Moisture Temperature / Pain No Abnormalities Noted: No Temperature: No Abnormality Tenderness on Palpation: Yes Wound Preparation Ulcer Cleansing: Rinsed/Irrigated with Saline, Other: soap and water, Topical Anesthetic Applied: Other: lidocaine 4%, Treatment Notes Wound #2 (Right, Distal, Lateral Lower Leg) 1. Cleansed with: Clean wound with Normal Saline Cleanse wound with  antibacterial soap and water 2. Anesthetic Topical Lidocaine 4% cream to wound bed prior to debridement 3. Peri-wound Care: Barrier cream Moisturizing lotion 4. Dressing Applied: Hydrafera Blue 5. Secondary Dressing Applied ABD Pad Dry Gauze 7. Secured with 4-Layer Compression System - Right Lower Extremity Notes xtrasorb, charcoal, unna to anchor Electronic Signature(s) Signed: 12/07/2017 5:30:48 PM By: [name] Entered By: [name] on 12/07/2017 10:10:25 [name] ([id number]) -------------------------------------------------------------------------------- Vitals Details Patient Name: [name] R. Date of Service: 12/07/2017 9:45 AM Medical Record Number: [id number] Patient Account Number: [id number] Date of Birth/Sex: [birth year] (37 y.o. Female) Treating RN: Ashok Cordia, Debi Primary Care Cashmere Dingley: PATIENT, NO Other Clinician: Referring Javeah Loeza: Referral, Self Treating Prudence Heiny/Extender: STONE III, HOYT Weeks in Treatment: 12 Vital Signs Time Taken: 10:02 Temperature (F): 97.9 Height (in): 74 Pulse (bpm): 82 Weight (lbs): 505 Respiratory Rate (breaths/min): 18 Body Mass Index (BMI): 64.8 Blood Pressure (mmHg): 121/96 Reference Range: 80 - 120 mg / dl Electronic Signature(s) Signed: 12/07/2017 5:30:48 PM By: [name] Entered By: [name] on 12/07/2017 10:03:38

## 2017-12-11 NOTE — Progress Notes (Signed)
[name], [name] ([id number]) Visit Report for 12/10/2017 Arrival Information Details Patient Name: [name], [name] Date of Service: 12/10/2017 3:15 PM Medical Record Number: [id number] Patient Account Number: [id number] Date of Birth/Sex: [birth year] (37 y.o. Female) Treating RN: Ashok CordiaPinkerton, Debi Primary Care Lorenda Grecco: PATIENT, NO Other Clinician: Referring Bereket Gernert: Referral, Self Treating Ladislaus Repsher/Extender: STONE III, HOYT Weeks in Treatment: 13 Visit Information History Since Last Visit All ordered tests and consults were completed: No Patient Arrived: Ambulatory Added or deleted any medications: No Arrival Time: 15:40 Any new allergies or adverse reactions: No Accompanied By: self Had a fall or experienced change in No Transfer Assistance: None activities of daily living that may affect Patient Identification Verified: Yes risk of falls: Secondary Verification Process Completed: Yes Signs or symptoms of abuse/neglect since last visito No Patient Requires Transmission-Based No Hospitalized since last visit: No Precautions: Has Dressing in Place as Prescribed: Yes Patient Has Alerts: No Has Compression in Place as Prescribed: Yes Pain Present Now: No Electronic Signature(s) Signed: 12/11/2017 9:12:55 AM By: Alejandro MullingPinkerton, Debra Entered By: Alejandro MullingPinkerton, Debra on 12/10/2017 15:50:51 Gossen, Annmargaret R. ([id number]) -------------------------------------------------------------------------------- Encounter Discharge Information Details Patient Name: [name], [name] Date of Service: 12/10/2017 3:15 PM Medical Record Number: [id number] Patient Account Number: [id number] Date of Birth/Sex: [birth year] (37 y.o. Female) Treating RN: Ashok CordiaPinkerton, Debi Primary Care Von Quintanar: PATIENT, NO Other Clinician: Referring Kiara Keep: Referral, Self Treating Johntae Broxterman/Extender: STONE III, HOYT Weeks in Treatment: 13 Encounter Discharge Information Items Discharge Pain Level: 0 Discharge Condition:  Stable Ambulatory Status: Ambulatory Discharge Destination: Home Private Transportation: Auto Accompanied By: self Schedule Follow-up Appointment: Yes Medication Reconciliation completed and No provided to Patient/Care Mariene Dickerman: Clinical Summary of Care: Electronic Signature(s) Signed: 12/10/2017 4:11:46 PM By: Alejandro MullingPinkerton, Debra Entered By: Alejandro MullingPinkerton, Debra on 12/10/2017 16:11:46 Kujawa, Adamarys R. ([id number]) -------------------------------------------------------------------------------- Patient/Caregiver Education Details Patient Name: [name], [name] Date of Service: 12/10/2017 3:15 PM Medical Record Number: [id number] Patient Account Number: [id number] Date of Birth/Gender: [birth year] (37 y.o. Female) Treating RN: Ashok CordiaPinkerton, Debi Primary Care Physician: PATIENT, NO Other Clinician: Referring Physician: Referral, Self Treating Physician/Extender: Linwood DibblesSTONE III, HOYT Weeks in Treatment: 13 Education Assessment Education Provided To: Patient Education Topics Provided Wound/Skin Impairment: Handouts: Caring for Your Ulcer, Other: do not get wrap wet Methods: Demonstration, Explain/Verbal Responses: State content correctly Electronic Signature(s) Signed: 12/11/2017 9:12:55 AM By: Alejandro MullingPinkerton, Debra Entered By: Alejandro MullingPinkerton, Debra on 12/10/2017 16:11:33 Halbert, Mikisha R. ([id number]) -------------------------------------------------------------------------------- Wound Assessment Details Patient Name: [name], [name] Date of Service: 12/10/2017 3:15 PM Medical Record Number: [id number] Patient Account Number: [id number] Date of Birth/Sex: [birth year] (37 y.o. Female) Treating RN: Ashok CordiaPinkerton, Debi Primary Care Madelaine Whipple: PATIENT, NO Other Clinician: Referring Godfrey Tritschler: Referral, Self Treating Shalia Bartko/Extender: STONE III, HOYT Weeks in Treatment: 13 Wound Status Wound Number: 2 Primary Etiology: Lymphedema Wound Location: Right Lower Leg - Lateral, Distal Wound Status: Open Wounding Event:  Gradually Appeared Comorbid History: Lymphedema, Hypertension Date Acquired: 08/08/2017 Weeks Of Treatment: 13 Clustered Wound: No Photos Photo Uploaded By: Alejandro MullingPinkerton, Debra on 12/10/2017 16:30:52 Wound Measurements Length: (cm) 2.4 Width: (cm) 4.4 Depth: (cm) 0.2 Area: (cm) 8.294 Volume: (cm) 1.659 % Reduction in Area: 61.3% % Reduction in Volume: 74.2% Epithelialization: Small (1-33%) Tunneling: No Undermining: No Wound Description Full Thickness With Exposed Support Classification: Structures Wound Margin: Flat and Intact Exudate Large Amount: Exudate Type: Serosanguineous Exudate Color: red, brown Foul Odor After Cleansing: No Slough/Fibrino Yes Wound Bed Granulation Amount: Large (67-100%) Exposed Structure Granulation Quality: Red Fascia Exposed: No Necrotic Amount: Small (1-33%) Fat Layer (Subcutaneous Tissue) Exposed: No Necrotic Quality: Adherent  Slough Tendon Exposed: No Muscle Exposed: No Joint Exposed: No Bone Exposed: No Olesen, [name] ([id number]) Periwound Skin Texture Texture Color No Abnormalities Noted: No No Abnormalities Noted: Yes Moisture Temperature / Pain No Abnormalities Noted: No Temperature: No Abnormality Tenderness on Palpation: Yes Wound Preparation Ulcer Cleansing: Rinsed/Irrigated with Saline, Other: soap and water, Topical Anesthetic Applied: None Treatment Notes Wound #2 (Right, Distal, Lateral Lower Leg) 1. Cleansed with: Clean wound with Normal Saline Cleanse wound with antibacterial soap and water 3. Peri-wound Care: Barrier cream Moisturizing lotion 4. Dressing Applied: Hydrafera Blue 5. Secondary Dressing Applied ABD Pad Dry Gauze 7. Secured with Tape 4-Layer Compression System - Right Lower Extremity Notes xtrasorb, charcoal, unna to anchor Electronic Signature(s) Signed: 12/11/2017 9:12:55 AM By: [name] Entered By: [name] on 12/10/2017 15:50:15

## 2017-12-14 ENCOUNTER — Encounter: Payer: Self-pay | Admitting: Physician Assistant

## 2017-12-16 NOTE — Progress Notes (Signed)
[name], [name] ([id number]) Visit Report for 12/14/2017 Arrival Information Details Patient Name: [name], [name] Date of Service: 12/14/2017 10:15 AM Medical Record Number: [id number] Patient Account Number: [id number] Date of Birth/Sex: [birth year] (37 y.o. Female) Treating RN: Renne CriglerFlinchum, Cheryl Primary Care Kali Ambler: PATIENT, NO Other Clinician: Referring Rick Warnick: Referral, Self Treating Dariyon Urquilla/Extender: STONE III, HOYT Weeks in Treatment: 13 Visit Information History Since Last Visit All ordered tests and consults were completed: No Patient Arrived: Ambulatory Added or deleted any medications: No Arrival Time: 10:26 Any new allergies or adverse reactions: No Accompanied By: self Had a fall or experienced change in No Transfer Assistance: None activities of daily living that may affect Patient Identification Verified: Yes risk of falls: Secondary Verification Process Completed: Yes Signs or symptoms of abuse/neglect since last visito No Patient Requires Transmission-Based No Hospitalized since last visit: No Precautions: Pain Present Now: No Patient Has Alerts: No Electronic Signature(s) Signed: 12/14/2017 4:49:30 PM By: Renne CriglerFlinchum, Cheryl Entered By: Renne CriglerFlinchum, Cheryl on 12/14/2017 10:26:32 [name], Masiyah R. ([id number]) -------------------------------------------------------------------------------- Encounter Discharge Information Details Patient Name: [name], [name] Date of Service: 12/14/2017 10:15 AM Medical Record Number: [id number] Patient Account Number: [id number] Date of Birth/Sex: [birth year] (37 y.o. Female) Treating RN: Renne CriglerFlinchum, Cheryl Primary Care Khristopher Kapaun: PATIENT, NO Other Clinician: Referring Salimatou Simone: Referral, Self Treating Aamina Skiff/Extender: STONE III, HOYT Weeks in Treatment: 13 Encounter Discharge Information Items Discharge Pain Level: 0 Discharge Condition: Stable Ambulatory Status: Ambulatory Discharge Destination:  Home Private Transportation: Auto Accompanied By: self Schedule Follow-up Appointment: Yes Medication Reconciliation completed and provided No to Patient/Care Jais Demir: Clinical Summary of Care: Electronic Signature(s) Signed: 12/14/2017 4:49:30 PM By: Renne CriglerFlinchum, Cheryl Entered By: Renne CriglerFlinchum, Cheryl on 12/14/2017 11:36:35 Pasko, [name] ([id number]) -------------------------------------------------------------------------------- Lower Extremity Assessment Details Patient Name: [name], [name] Date of Service: 12/14/2017 10:15 AM Medical Record Number: [id number] Patient Account Number: [id number] Date of Birth/Sex: [birth year] (37 y.o. Female) Treating RN: Renne CriglerFlinchum, Cheryl Primary Care Hyatt Capobianco: PATIENT, NO Other Clinician: Referring Steen Bisig: Referral, Self Treating Sergio Hobart/Extender: STONE III, HOYT Weeks in Treatment: 13 Edema Assessment Assessed: [Left: No] [Right: No] Edema: [Left: Ye] [Right: s] Calf Left: Right: Point of Measurement: 35 cm From Medial Instep cm 57 cm Ankle Left: Right: Point of Measurement: 9 cm From Medial Instep cm 36.1 cm Vascular Assessment Claudication: Claudication Assessment [Right:None] Pulses: Dorsalis Pedis Palpable: [Right:Yes] Posterior Tibial Extremity colors, hair growth, and conditions: Extremity Color: [Right:Hyperpigmented] Hair Growth on Extremity: [Right:Yes] Temperature of Extremity: [Right:Warm] Capillary Refill: [Right:< 3 seconds] Toe Nail Assessment Left: Right: Thick: Yes Discolored: Yes Deformed: Yes Improper Length and Hygiene: Yes Electronic Signature(s) Signed: 12/14/2017 4:49:30 PM By: Renne CriglerFlinchum, Cheryl Entered By: Renne CriglerFlinchum, Cheryl on 12/14/2017 10:40:50 Aubuchon, [name] ([id number]) -------------------------------------------------------------------------------- Multi Wound Chart Details Patient Name: [name], [name] Date of Service: 12/14/2017 10:15 AM Medical Record Number: [id number] Patient Account Number:  [id number] Date of Birth/Sex: [birth year] (37 y.o. Female) Treating RN: Renne CriglerFlinchum, Cheryl Primary Care Quinten Allerton: PATIENT, NO Other Clinician: Referring Vonne Mcdanel: Referral, Self Treating Calem Cocozza/Extender: STONE III, HOYT Weeks in Treatment: 13 Vital Signs Height(in): 74 Pulse(bpm): 96 Weight(lbs): 505 Blood Pressure(mmHg): 95/77 Body Mass Index(BMI): 65 Temperature(F): 97.9 Respiratory Rate 18 (breaths/min): Photos: [2:No Photos] [N/A:N/A] Wound Location: [2:Right Lower Leg - Lateral, Distal] [N/A:N/A] Wounding Event: [2:Gradually Appeared] [N/A:N/A] Primary Etiology: [2:Lymphedema] [N/A:N/A] Comorbid History: [2:Lymphedema, Hypertension] [N/A:N/A] Date Acquired: [2:08/08/2017] [N/A:N/A] Weeks of Treatment: [2:13] [N/A:N/A] Wound Status: [2:Open] [N/A:N/A] Measurements L x W x D [2:1.8x1.1x0.1] [N/A:N/A] (cm) Area (cm) : [2:1.555] [N/A:N/A] Volume (cm) : [2:0.156] [N/A:N/A] % Reduction in Area: [2:92.70%] [N/A:N/A] %  Reduction in Volume: [2:97.60%] [N/A:N/A] Classification: [2:Full Thickness With Exposed Support Structures] [N/A:N/A] Exudate Amount: [2:Medium] [N/A:N/A] Exudate Type: [2:Serosanguineous] [N/A:N/A] Exudate Color: [2:red, brown] [N/A:N/A] Wound Margin: [2:Flat and Intact] [N/A:N/A] Granulation Amount: [2:Large (67-100%)] [N/A:N/A] Granulation Quality: [2:Red] [N/A:N/A] Necrotic Amount: [2:Small (1-33%)] [N/A:N/A] Exposed Structures: [2:Fat Layer (Subcutaneous Tissue) Exposed: Yes Fascia: No Tendon: No Muscle: No Joint: No Bone: No] [N/A:N/A] Epithelialization: [2:Small (1-33%)] [N/A:N/A] Periwound Skin Texture: [2:Scarring: Yes Excoriation: No Induration: No Callus: No] [N/A:N/A] Crepitus: No Rash: No Periwound Skin Moisture: Maceration: No N/A N/A Dry/Scaly: No Periwound Skin Color: Atrophie Blanche: No N/A N/A Cyanosis: No Ecchymosis: No Erythema: No Hemosiderin Staining: No Mottled: No Pallor: No Rubor: No Temperature: No Abnormality N/A  N/A Tenderness on Palpation: Yes N/A N/A Wound Preparation: Ulcer Cleansing: N/A N/A Rinsed/Irrigated with Saline, Other: soap and water Topical Anesthetic Applied: None, Other: Lidocaine 4% Treatment Notes Electronic Signature(s) Signed: 12/14/2017 4:49:30 PM By: Renne Crigler Entered By: Renne Crigler on 12/14/2017 11:10:18 Kerlin, [name] ([id number]) -------------------------------------------------------------------------------- Multi-Disciplinary Care Plan Details Patient Name: Jessica Casey R. Date of Service: 12/14/2017 10:15 AM Medical Record Number: [id number] Patient Account Number: [id number] Date of Birth/Sex: [name], [name] (37 y.o. Female) Treating RN: Renne Crigler Primary Care Cristhian Vanhook: PATIENT, NO Other Clinician: Referring Levone Otten: Referral, Self Treating Taleya Whitcher/Extender: STONE III, HOYT Weeks in Treatment: 13 Active Inactive ` Orientation to the Wound Care Program Nursing Diagnoses: Knowledge deficit related to the wound healing center program Goals: Patient/caregiver will verbalize understanding of the Wound Healing Center Program Date Initiated: 09/10/2017 Target Resolution Date: 11/23/2017 Goal Status: Active Interventions: Provide education on orientation to the wound center Notes: ` Venous Leg Ulcer Nursing Diagnoses: Knowledge deficit related to disease process and management Goals: Patient/caregiver will verbalize understanding of disease process and disease management Date Initiated: 09/10/2017 Target Resolution Date: 11/23/2017 Goal Status: Active Interventions: Assess peripheral edema status every visit. Notes: ` Wound/Skin Impairment Nursing Diagnoses: Impaired tissue integrity Goals: Ulcer/skin breakdown will heal within [name] weeks Date Initiated: 09/10/2017 Target Resolution Date: 11/24/2017 Goal Status: Active Interventions: KASSIA, DEMARINIS ([id number]) Assess patient/caregiver ability to obtain necessary supplies Assess  patient/caregiver ability to perform ulcer/skin care regimen upon admission and as needed Assess ulceration(s) every visit Notes: Electronic Signature(s) Signed: 12/14/2017 4:49:30 PM By: Renne Crigler Entered By: Renne Crigler on 12/14/2017 11:10:07 Agresti, Armine R. ([id number]) -------------------------------------------------------------------------------- Pain Assessment Details Patient Name: Jessica Casey R. Date of Service: 12/14/2017 10:15 AM Medical Record Number: [id number] Patient Account Number: [id number] Date of Birth/Sex: [birth year][name] (37 y.o. Female) Treating RN: Renne Crigler Primary Care Emeric Novinger: PATIENT, NO Other Clinician: Referring Derika Eckles: Referral, Self Treating Johnmichael Melhorn/Extender: STONE III, HOYT Weeks in Treatment: 13 Active Problems Location of Pain Severity and Description of Pain Patient Has Paino No Site Locations Pain Management and Medication Current Pain Management: Electronic Signature(s) Signed: 12/14/2017 4:49:30 PM By: Renne Crigler Entered By: Renne Crigler on 12/14/2017 10:26:39 Jessica Hind ([id number]) -------------------------------------------------------------------------------- Patient/Caregiver Education Details Patient Name: Jessica Casey R. Date of Service: 12/14/2017 10:15 AM Medical Record Number: [id number] Patient Account Number: [id number] Date of Birth/Gender: [birth year][name] (37 y.o. Female) Treating RN: Renne Crigler Primary Care Physician: PATIENT, NO Other Clinician: Referring Physician: Referral, Self Treating Physician/Extender: Linwood Dibbles, HOYT Weeks in Treatment: 13 Education Assessment Education Provided To: Patient Education Topics Provided Wound Debridement: Handouts: Wound Debridement Methods: Explain/Verbal Responses: State content correctly Wound/Skin Impairment: Handouts: Caring for Your Ulcer Methods: Explain/Verbal Responses: State content correctly Electronic Signature(s) Signed: 12/14/2017  4:49:30 PM By: Renne Crigler Entered By: Renne Crigler on 12/14/2017 11:36:52 [name], Jessica  R. ([id number]) -------------------------------------------------------------------------------- Wound Assessment Details Patient Name: [name], MCWETHY. Date of Service: 12/14/2017 10:15 AM Medical Record Number: [id number] Patient Account Number: [id number] Date of Birth/Sex: [birth year][name] (37 y.o. Female) Treating RN: Renne Crigler Primary Care Orvan Papadakis: PATIENT, NO Other Clinician: Referring Wilmont Olund: Referral, Self Treating Advay Volante/Extender: STONE III, HOYT Weeks in Treatment: 13 Wound Status Wound Number: 2 Primary Etiology: Lymphedema Wound Location: Right, Distal, Lateral Lower Leg Wound Status: Open Wounding Event: Gradually Appeared Comorbid History: Lymphedema, Hypertension Date Acquired: 08/08/2017 Weeks Of Treatment: 13 Clustered Wound: No Photos Photo Uploaded By: Renne Crigler on 12/14/2017 16:55:43 Wound Measurements Length: (cm) 4.5 Width: (cm) 4.6 Depth: (cm) 0.1 Area: (cm) 16.258 Volume: (cm) 1.626 % Reduction in Area: 24.2% % Reduction in Volume: 74.7% Epithelialization: Small (1-33%) Tunneling: No Undermining: No Wound Description Full Thickness With Exposed Support Classification: Structures Wound Margin: Flat and Intact Exudate Medium Amount: Exudate Type: Serosanguineous Exudate Color: red, brown Foul Odor After Cleansing: No Slough/Fibrino Yes Wound Bed Granulation Amount: Large (67-100%) Exposed Structure Granulation Quality: Red Fascia Exposed: No Necrotic Amount: Small (1-33%) Fat Layer (Subcutaneous Tissue) Exposed: Yes Necrotic Quality: Adherent Slough Tendon Exposed: No Muscle Exposed: No Joint Exposed: No Bone Exposed: No Stodghill, [name] ([id number]) Periwound Skin Texture Texture Color No Abnormalities Noted: No No Abnormalities Noted: Yes Callus: No Temperature / Pain Crepitus: No Temperature: No Abnormality Excoriation:  No Tenderness on Palpation: Yes Induration: No Rash: No Scarring: Yes Moisture No Abnormalities Noted: No Dry / Scaly: No Maceration: No Wound Preparation Ulcer Cleansing: Rinsed/Irrigated with Saline, Other: soap and water, Topical Anesthetic Applied: None, Other: Lidocaine 4%, Treatment Notes Wound #2 (Right, Distal, Lateral Lower Leg) 1. Cleansed with: Clean wound with Normal Saline 2. Anesthetic Topical Lidocaine 4% cream to wound bed prior to debridement 3. Peri-wound Care: Barrier cream Moisturizing lotion 4. Dressing Applied: Prisma Ag 7. Secured with 4-Layer Compression System - Right Lower Extremity Notes xtrasorb, unna to Ecologist) Signed: 12/14/2017 4:49:30 PM By: Renne Crigler Entered By: Renne Crigler on 12/14/2017 11:12:10 [name], [name] ([id number]) -------------------------------------------------------------------------------- Vitals Details Patient Name: Jessica Casey R. Date of Service: 12/14/2017 10:15 AM Medical Record Number: [id number] Patient Account Number: [id number] Date of Birth/Sex: August [name], [name] (37 y.o. Female) Treating RN: Renne Crigler Primary Care Simpson Paulos: PATIENT, NO Other Clinician: Referring Gunnard Dorrance: Referral, Self Treating Yumna Ebers/Extender: STONE III, HOYT Weeks in Treatment: 13 Vital Signs Time Taken: 10:26 Temperature (F): 97.9 Height (in): 74 Pulse (bpm): 96 Weight (lbs): 505 Respiratory Rate (breaths/min): 18 Body Mass Index (BMI): 64.8 Blood Pressure (mmHg): 95/77 Reference Range: 80 - 120 mg / dl Electronic Signature(s) Signed: 12/14/2017 4:49:30 PM By: Renne Crigler Entered By: Renne Crigler on 12/14/2017 10:27:00

## 2017-12-17 NOTE — Progress Notes (Signed)
LANIJAH, WARZECHA ([id number]) Visit Report for 12/14/2017 Chief Complaint Document Details Patient Name: [name], [name] Date of Service: 12/14/2017 10:15 AM Medical Record Number: [id number] Patient Account Number: [id number] Date of Birth/Sex: 1981/03/04 (37 y.o. Female) Treating RN: Renne Crigler Primary Care Provider: PATIENT, NO Other Clinician: Referring Provider: Referral, Self Treating Provider/Extender: STONE III, HOYT Weeks in Treatment: 13 Information Obtained from: Patient Chief Complaint She is here in follow up evaluation of right lower extremity ulcers Electronic Signature(s) Signed: 12/15/2017 12:23:43 AM By: Lenda Kelp PA-C Entered By: Lenda Kelp on 12/14/2017 10:41:42 Mcquown, Kinslea R. ([id number]) -------------------------------------------------------------------------------- Debridement Details Patient Name: Jessica Casey R. Date of Service: 12/14/2017 10:15 AM Medical Record Number: [id number] Patient Account Number: [id number] Date of Birth/Sex: December 31, 1980 (37 y.o. Female) Treating RN: Renne Crigler Primary Care Provider: PATIENT, NO Other Clinician: Referring Provider: Referral, Self Treating Provider/Extender: STONE III, HOYT Weeks in Treatment: 13 Debridement Performed for Wound #2 Right,Distal,Lateral Lower Leg Assessment: Performed By: Physician STONE III, HOYT E., PA-C Debridement: Debridement Pre-procedure Verification/Time Yes - 11:12 Out Taken: Start Time: 11:12 Pain Control: Other : lidocaine 4% Level: Skin/Subcutaneous Tissue Total Area Debrided (L x W): 4.5 (cm) x 2.8 (cm) = 12.6 (cm) Tissue and other material Viable, Non-Viable, Fibrin/Slough, Skin, Subcutaneous debrided: Instrument: Curette Bleeding: Minimum Hemostasis Achieved: Pressure End Time: 11:13 Procedural Pain: 0 Post Procedural Pain: 0 Response to Treatment: Procedure was tolerated well Post Debridement Measurements of Total Wound Length: (cm) 4.5 Width: (cm)  4.6 Depth: (cm) 0.2 Volume: (cm) 3.252 Character of Wound/Ulcer Post Debridement: Stable Post Procedure Diagnosis Same as Pre-procedure Electronic Signature(s) Signed: 12/14/2017 4:49:30 PM By: Renne Crigler Signed: 12/15/2017 12:23:43 AM By: Lenda Kelp PA-C Entered By: Renne Crigler on 12/14/2017 11:13:55 Bacot, Logan R. ([id number]) -------------------------------------------------------------------------------- HPI Details Patient Name: Jessica Casey R. Date of Service: 12/14/2017 10:15 AM Medical Record Number: [id number] Patient Account Number: [id number] Date of Birth/Sex: June 15, 1981 (37 y.o. Female) Treating RN: Renne Crigler Primary Care Provider: PATIENT, NO Other Clinician: Referring Provider: Referral, Self Treating Provider/Extender: STONE III, HOYT Weeks in Treatment: 13 History of Present Illness HPI Description: 37 year old patient well known to our [facility] wound care clinic where she has been seen since 2016 for bilateral lower extremity venous insufficiency disease with lymphedema and multiple ulcerations associated with morbid obesity. she had custom-made compression stockings and lymphedema pumps which were used in the past. most recently she was admitted to the hospital between October 11 and 09/02/2017 with sepsis, lower extremity wounds and lymphedema.she was initially treated in the outpatient with Keflex and Bactrim. she was initially treated in the hospital with vancomycin and Zosyn and changed over to Unasyn until her white count improved and her blood cultures were negative for 3 days. After her inpatient management she was discharged home on Augmentin to end on 09/13/2017 with a 14 day course. she has had outpatient vascular duplex scans completed in November 2017 and her right ABI was 1.1 and the left ABI is 1.3. she had normal toe brachial indices bilaterally.she had three-vessel runoff in the right lower extremity and two-vessel runoff  in the left lower extremity. On questioning the patient she does have custom made compression stockings and also has a lymphedema pump but has not been using it appropriately and has not been taking good care of herself. 09/17/2017 -- she returns today with compression stockings on the left side and the right side has had significant amount of drainage and has a very strong odor 09/24/2017 -- the drainage is  increased significantly and she has more lymphedema and a very strong odor to her wound. Though she does not have systemic symptoms, or overt infection I believe she will benefit from some doxycycline given empirically. 10/01/2017 -- after starting the doxycycline and changing the dressing twice a week her symptoms and signs have definitely improved overall. 10/08/2017 -- she has completed her course of doxycycline but continues to have a lot of drainage and needs twice a week dressing changes. 11/08/17-she is here in follow-up evaluation for right lower extremity ulcers. She admits to using her lymphedema pumps twice daily, one hour per session. she is voicing no complaints or concerns, no signs of infection will change to [facility] 12/14/17 on evaluation today patient appears to be doing very well in regard to her wounds. She has been tolerating the dressing changes she continues to develop some portly the adherent granular tissue on the surface of the wound with some Slough. Obviously we are trying to get too much better wound bed. With that being said the hyper granulation the Hydrofera Blue Dressing to have helped with which is excellent news. However I think it may be time to try something a little bit different at this point. Electronic Signature(s) Signed: 12/15/2017 12:23:43 AM By: Lenda Kelp PA-C Entered By: Lenda Kelp on 12/14/2017 17:20:01 Jessica Hind ([id number]) -------------------------------------------------------------------------------- Physical Exam  Details Patient Name: Jessica Casey R. Date of Service: 12/14/2017 10:15 AM Medical Record Number: [id number] Patient Account Number: [id number] Date of Birth/Sex: 1981-02-08 (37 y.o. Female) Treating RN: Renne Crigler Primary Care Provider: PATIENT, NO Other Clinician: Referring Provider: Referral, Self Treating Provider/Extender: STONE III, HOYT Weeks in Treatment: 13 Constitutional Obese and well-hydrated in no acute distress. Respiratory normal breathing without difficulty. Cardiovascular 1+ pitting edema of the bilateral lower extremities. Psychiatric this patient is able to make decisions and demonstrates good insight into disease process. Alert and Oriented x 3. pleasant and cooperative. Notes Patient's wound bed did show poor granulation tissue along with slough noted on the surface this was sharply debride it today with good result patient tolerated debridement with only minimal discomfort. Her swelling in general appears to be doing much better. Electronic Signature(s) Signed: 12/15/2017 12:23:43 AM By: Lenda Kelp PA-C Entered By: Lenda Kelp on 12/14/2017 17:20:48 Jessica Hind ([id number]) -------------------------------------------------------------------------------- Physician Orders Details Patient Name: Jessica Casey R. Date of Service: 12/14/2017 10:15 AM Medical Record Number: [id number] Patient Account Number: [id number] Date of Birth/Sex: [birth year] (37 y.o. Female) Treating RN: Renne Crigler Primary Care Provider: PATIENT, NO Other Clinician: Referring Provider: Referral, Self Treating Provider/Extender: STONE III, HOYT Weeks in Treatment: 13 Verbal / Phone Orders: No Diagnosis Coding ICD-10 Coding Code Description [phone] 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d I89.0 Lymphedema, not elsewhere classified I87.311 Chronic venous hypertension (idiopathic) with ulcer of right lower extremity E66.01 Morbid (severe) obesity  due to excess calories Wound Cleansing Wound #2 Right,Distal,Lateral Lower Leg o Clean wound with Normal Saline. o Cleanse wound with mild soap and water Anesthetic (add to Medication List) Wound #2 Right,Distal,Lateral Lower Leg o Topical Lidocaine 4% cream applied to wound bed prior to debridement (In Clinic Only). Skin Barriers/Peri-Wound Care Wound #2 Right,Distal,Lateral Lower Leg o Barrier cream o Moisturizing lotion Primary Wound Dressing Wound #2 Right,Distal,Lateral Lower Leg o Prisma Ag Secondary Dressing Wound #2 Right,Distal,Lateral Lower Leg o ABD pad o XtraSorb o Other - charcoal Dressing Change Frequency Wound #2 Right,Distal,Lateral Lower Leg o Dressing is to be changed Monday and  Thursday. Follow-up Appointments o Return Appointment in 1 week. o Nurse Visit as needed Edema Control Wound #2 Right,Distal,Lateral Lower Leg Ploeger, Brittanee R. ([id number]) o 4-Layer Compression System - Right Lower Extremity - unna to anchor o Elevate legs to the level of the heart and pump ankles as often as possible Additional Orders / Instructions Wound #2 Right,Distal,Lateral Lower Leg o Vitamin A; Vitamin C, Zinc o Increase protein intake. o Other: - Please watch your salt (sodium) intake Electronic Signature(s) Signed: 12/14/2017 4:49:30 PM By: Renne Crigler Signed: 12/15/2017 12:23:43 AM By: Lenda Kelp PA-C Entered By: Renne Crigler on 12/14/2017 11:15:24 Fewell, Ridley R. ([id number]) -------------------------------------------------------------------------------- Problem List Details Patient Name: Jessica Casey R. Date of Service: 12/14/2017 10:15 AM Medical Record Number: [id number] Patient Account Number: [id number] Date of Birth/Sex: 1980-12-24 (37 y.o. Female) Treating RN: Renne Crigler Primary Care Provider: PATIENT, NO Other Clinician: Referring Provider: Referral, Self Treating Provider/Extender: Linwood Dibbles, HOYT Weeks in  Treatment: 13 Active Problems ICD-10 Encounter Code Description Active Date Diagnosis L97.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d 09/10/2017 Yes I89.0 Lymphedema, not elsewhere classified 09/10/2017 Yes I87.311 Chronic venous hypertension (idiopathic) with ulcer of right lower 09/10/2017 Yes extremity E66.01 Morbid (severe) obesity due to excess calories 09/10/2017 Yes Inactive Problems Resolved Problems Electronic Signature(s) Signed: 12/15/2017 12:23:43 AM By: Lenda Kelp PA-C Entered By: Lenda Kelp on 12/14/2017 10:41:37 Cheadle, Tauriel R. ([id number]) -------------------------------------------------------------------------------- Progress Note Details Patient Name: Jessica Casey R. Date of Service: 12/14/2017 10:15 AM Medical Record Number: [id number] Patient Account Number: [id number] Date of Birth/Sex: [birth year] (37 y.o. Female) Treating RN: Renne Crigler Primary Care Provider: PATIENT, NO Other Clinician: Referring Provider: Referral, Self Treating Provider/Extender: STONE III, HOYT Weeks in Treatment: 13 Subjective Chief Complaint Information obtained from Patient She is here in follow up evaluation of right lower extremity ulcers History of Present Illness (HPI) 37 year old patient well known to our [facility] wound care clinic where she has been seen since 2016 for bilateral lower extremity venous insufficiency disease with lymphedema and multiple ulcerations associated with morbid obesity. she had custom-made compression stockings and lymphedema pumps which were used in the past. most recently she was admitted to the hospital between October 11 and 09/02/2017 with sepsis, lower extremity wounds and lymphedema.she was initially treated in the outpatient with Keflex and Bactrim. she was initially treated in the hospital with vancomycin and Zosyn and changed over to Unasyn until her white count improved and her blood cultures were  negative for 3 days. After her inpatient management she was discharged home on Augmentin to end on 09/13/2017 with a 14 day course. she has had outpatient vascular duplex scans completed in November 2017 and her right ABI was 1.1 and the left ABI is 1.3. she had normal toe brachial indices bilaterally.she had three-vessel runoff in the right lower extremity and two-vessel runoff in the left lower extremity. On questioning the patient she does have custom made compression stockings and also has a lymphedema pump but has not been using it appropriately and has not been taking good care of herself. 09/17/2017 -- she returns today with compression stockings on the left side and the right side has had significant amount of drainage and has a very strong odor 09/24/2017 -- the drainage is increased significantly and she has more lymphedema and a very strong odor to her wound. Though she does not have systemic symptoms, or overt infection I believe she will benefit from some doxycycline given empirically. 10/01/2017 -- after starting  the doxycycline and changing the dressing twice a week her symptoms and signs have definitely improved overall. 10/08/2017 -- she has completed her course of doxycycline but continues to have a lot of drainage and needs twice a week dressing changes. 11/08/17-she is here in follow-up evaluation for right lower extremity ulcers. She admits to using her lymphedema pumps twice daily, one hour per session. she is voicing no complaints or concerns, no signs of infection [facility] on evaluation today patient appears to be doing very well in regard to her wounds. She has been tolerating the dressing changes she continues to develop some portly the adherent granular tissue on the surface of the wound with some Slough. Obviously we are trying to get too much better wound bed. With that being said the hyper granulation the Hydrofera Blue Dressing to have helped  with which is excellent news. However I think it may be time to try something a little bit different at this point. Patient History Information obtained from Patient. Family History [name], [name] ([id number]) Kidney Disease - Mother, No family history of Cancer, Diabetes, Heart Disease, Hereditary Spherocytosis, Hypertension, Lung Disease, Seizures, Stroke, Thyroid Problems, Tuberculosis. Social History Never smoker, Marital Status - Married, Alcohol Use - Never, Drug Use - No History, Caffeine Use - Daily. Review of Systems (ROS) Constitutional Symptoms (General Health) Denies complaints or symptoms of Fever, Chills. Respiratory The patient has no complaints or symptoms. Cardiovascular Complains or has symptoms of LE edema. Psychiatric The patient has no complaints or symptoms. Objective Constitutional Obese and well-hydrated in no acute distress. Vitals Time Taken: 10:26 AM, Height: 74 in, Weight: 505 lbs, BMI: 64.8, Temperature: 97.9 F, Pulse: 96 bpm, Respiratory Rate: 18 breaths/min, Blood Pressure: 95/77 mmHg. Respiratory normal breathing without difficulty. Cardiovascular 1+ pitting edema of the bilateral lower extremities. Psychiatric this patient is able to make decisions and demonstrates good insight into disease process. Alert and Oriented x 3. pleasant and cooperative. General Notes: Patient's wound bed did show poor granulation tissue along with slough noted on the surface this was sharply debride it today with good result patient tolerated debridement with only minimal discomfort. Her swelling in general appears to be doing much better. Integumentary (Hair, Skin) Wound #2 status is Open. Original cause of wound was Gradually Appeared. The wound is located on the Right,Distal,Lateral Lower Leg. The wound measures 4.5cm length x 4.6cm width x 0.1cm depth; 16.258cm^2 area and 1.626cm^3 volume. There is Fat Layer (Subcutaneous Tissue) Exposed exposed. There is no  tunneling or undermining noted. There is a medium amount of serosanguineous drainage noted. The wound margin is flat and intact. There is large (67-100%) red granulation within the wound bed. There is a small (1-33%) amount of necrotic tissue within the wound bed including Adherent Slough. The periwound skin appearance had no abnormalities noted for color. The periwound skin appearance exhibited: Scarring. The periwound skin appearance did not exhibit: Callus, Crepitus, Excoriation, Induration, Rash, Dry/Scaly, Maceration. Periwound temperature was noted as No Abnormality. The periwound has tenderness on palpation. [name], Damari R. ([id number]) Assessment Active Problems ICD-10 [phone] - 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d I89.0 - Lymphedema, not elsewhere classified I87.311 - Chronic venous hypertension (idiopathic) with ulcer of right lower extremity E66.01 - Morbid (severe) obesity due to excess calories Procedures Wound #2 Pre-procedure diagnosis of Wound #2 is a Lymphedema located on the Right,Distal,Lateral Lower Leg . There was a Skin/Subcutaneous Tissue Debridement ([id number]) debridement with total area of 12.6 sq  cm performed by STONE III, HOYT E., PA-C. with the following instrument(s): Curette to remove Viable and Non-Viable tissue/material including Fibrin/Slough, Skin, and Subcutaneous after achieving pain control using Other (lidocaine 4%). A time out was conducted at 11:12, prior to the start of the procedure. A Minimum amount of bleeding was controlled with Pressure. The procedure was tolerated well with a pain level of 0 throughout and a pain level of 0 following the procedure. Post Debridement Measurements: 4.5cm length x 4.6cm width x 0.2cm depth; 3.252cm^3 volume. Character of Wound/Ulcer Post Debridement is stable. Post procedure Diagnosis Wound #2: Same as Pre-Procedure Plan Wound Cleansing: Wound #2 Right,Distal,Lateral Lower Leg: Clean  wound with Normal Saline. Cleanse wound with mild soap and water Anesthetic (add to Medication List): Wound #2 Right,Distal,Lateral Lower Leg: Topical Lidocaine 4% cream applied to wound bed prior to debridement (In Clinic Only). Skin Barriers/Peri-Wound Care: Wound #2 Right,Distal,Lateral Lower Leg: Barrier cream Moisturizing lotion Primary Wound Dressing: Wound #2 Right,Distal,Lateral Lower Leg: Prisma Ag Secondary Dressing: Wound #2 Right,Distal,Lateral Lower Leg: ABD pad XtraSorb Other - charcoal Dressing Change Frequency: Wound #2 Right,Distal,Lateral Lower Leg: Tait, Catherine R. ([id number]) Dressing is to be changed Monday and Thursday. Follow-up Appointments: Return Appointment in 1 week. Nurse Visit as needed Edema Control: Wound #2 Right,Distal,Lateral Lower Leg: 4-Layer Compression System - Right Lower Extremity - unna to anchor Elevate legs to the level of the heart and pump ankles as often as possible Additional Orders / Instructions: Wound #2 Right,Distal,Lateral Lower Leg: Vitamin A; Vitamin C, Zinc Increase protein intake. Other: - Please watch your salt (sodium) intake I'm going to recommend at this point that we switch to a silver collagen dressing for the next week to see how this will do. She is in agreement with this plan. We will hopefully see better granulation filling in however. Please see above for specific wound care orders. We will see patient for re-evaluation in 1 week(s) here in the clinic. If anything worsens or changes patient will contact our office for additional recommendations. Electronic Signature(s) Signed: 12/15/2017 12:23:43 AM By: Lenda Kelp PA-C Entered By: Lenda Kelp on 12/14/2017 17:21:35 Jessica Hind ([id number]) -------------------------------------------------------------------------------- ROS/PFSH Details Patient Name: Jessica Casey R. Date of Service: 12/14/2017 10:15 AM Medical Record Number: [id number] Patient  Account Number: [id number] Date of Birth/Sex: 02-06-1981 (37 y.o. Female) Treating RN: Renne Crigler Primary Care Provider: PATIENT, NO Other Clinician: Referring Provider: Referral, Self Treating Provider/Extender: STONE III, HOYT Weeks in Treatment: 13 Information Obtained From Patient Wound History Do you currently have one or more open woundso Yes How many open wounds do you currently haveo 1 Approximately how long have you had your woundso 1 month How have you been treating your wound(s) until nowo aquacel ag Has your wound(s) ever healed and then re-openedo No Have you had any lab work done in the past montho No Have you tested positive for an antibiotic resistant organism (MRSA, VRE)o No Have you tested positive for osteomyelitis (bone infection)o No Have you had any tests for circulation on your legso Yes Who ordered the testo [facility] Where was the test doneo [facility] Constitutional Symptoms (General Health) Complaints and Symptoms: Negative for: Fever; Chills Cardiovascular Complaints and Symptoms: Positive for: LE edema Medical History: Positive for: Hypertension Negative for: Angina; Arrhythmia; Congestive Heart Failure; Coronary Artery Disease; Deep Vein Thrombosis; Hypotension; Myocardial Infarction; Peripheral Arterial Disease; Peripheral Venous Disease; Phlebitis; Vasculitis Hematologic/Lymphatic Medical History: Positive for: Lymphedema Respiratory Complaints and Symptoms: No Complaints or Symptoms Medical History:  Negative for: Aspiration; Asthma; Chronic Obstructive Pulmonary Disease (COPD); Pneumothorax; Sleep Apnea; Tuberculosis Psychiatric Complaints and Symptoms: No Complaints or Symptoms Debois, Larose R. ([id number]) Immunizations Pneumococcal Vaccine: Received Pneumococcal Vaccination: No Immunization Notes: up to date Implantable Devices Family and Social History Cancer: No; Diabetes: No; Heart Disease: No; Hereditary Spherocytosis: No;  Hypertension: No; Kidney Disease: Yes - Mother; Lung Disease: No; Seizures: No; Stroke: No; Thyroid Problems: No; Tuberculosis: No; Never smoker; Marital Status - Married; Alcohol Use: Never; Drug Use: No History; Caffeine Use: Daily; Financial Concerns: No; Food, Clothing or Shelter Needs: No; Support System Lacking: No; Transportation Concerns: No; Advanced Directives: No; Patient does not want information on Advanced Directives Physician Affirmation I have reviewed and agree with the above information. Electronic Signature(s) Signed: 12/15/2017 12:23:43 AM By: Lenda Kelp PA-C Signed: 12/17/2017 12:34:10 PM By: Renne Crigler Entered By: Lenda Kelp on 12/14/2017 17:20:21 Weyand, Amahia RMarland Kitchen ([id number]) -------------------------------------------------------------------------------- SuperBill Details Patient Name: Jessica Casey R. Date of Service: 12/14/2017 Medical Record Number: [id number] Patient Account Number: [id number] Date of Birth/Sex: 30-Jun-1981 (37 y.o. Female) Treating RN: Renne Crigler Primary Care Provider: PATIENT, NO Other Clinician: Referring Provider: Referral, Self Treating Provider/Extender: STONE III, HOYT Weeks in Treatment: 13 Diagnosis Coding ICD-10 Codes Code Description [phone] 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d I89.0 Lymphedema, not elsewhere classified I87.311 Chronic venous hypertension (idiopathic) with ulcer of right lower extremity E66.01 Morbid (severe) obesity due to excess calories Facility Procedures CPT4 Code: [id number] Description: 11042 - DEB SUBQ TISSUE 20 SQ CM/< ICD-10 Diagnosis Description L97.212 Non-pressure chronic ulcer of right calf with fat layer expo Modifier: sed Quantity: 1 Physician Procedures CPT4 Code: [id number] Description: 11042 - WC PHYS SUBQ TISS 20 SQ CM ICD-10 Diagnosis Description L97.212 Non-pressure chronic ulcer of right calf with fat layer expo Modifier: sed Quantity: 1 Electronic  Signature(s) Signed: 12/15/2017 12:23:43 AM By: Lenda Kelp PA-C Entered By: Lenda Kelp on 12/14/2017 17:21:43

## 2017-12-19 NOTE — Progress Notes (Signed)
PARIS, HOHN ([id number]) Visit Report for 12/18/2017 Arrival Information Details Patient Name: [name], [name] Date of Service: 12/18/2017 8:45 AM Medical Record Number: [id number] Patient Account Number: [id number] Date of Birth/Sex: 1981/03/27 (37 y.o. Female) Treating RN: Renne Crigler Primary Care Sulma Ruffino: PATIENT, NO Other Clinician: Referring Yannely Kintzel: Referral, Self Treating Dequandre Cordova/Extender: Kathreen Cosier in Treatment: 14 Visit Information History Since Last Visit Pain Present Now: No Patient Arrived: Ambulatory Arrival Time: 09:32 Accompanied By: self Transfer Assistance: None Patient Identification Verified: Yes Secondary Verification Process Completed: Yes Patient Requires Transmission-Based No Precautions: Patient Has Alerts: No Electronic Signature(s) Signed: 12/18/2017 2:06:29 PM By: Renne Crigler Entered By: Renne Crigler on 12/18/2017 09:32:56 Murph, Margret R. ([id number]) -------------------------------------------------------------------------------- Clinic Level of Care Assessment Details Patient Name: Jessica Casey R. Date of Service: 12/18/2017 8:45 AM Medical Record Number: [id number] Patient Account Number: [id number] Date of Birth/Sex: Jun 29, 1981 (37 y.o. Female) Treating RN: Renne Crigler Primary Care Kyrielle Urbanski: PATIENT, NO Other Clinician: Referring Delberta Folts: Referral, Self Treating Avani Sensabaugh/Extender: Kathreen Cosier in Treatment: 14 Clinic Level of Care Assessment Items TOOL 4 Quantity Score []  - Use when only an EandM is performed on FOLLOW-UP visit 0 ASSESSMENTS - Nursing Assessment / Reassessment []  - Reassessment of Co-morbidities (includes updates in patient status) 0 X- 1 5 Reassessment of Adherence to Treatment Plan ASSESSMENTS - Wound and Skin Assessment / Reassessment X - Simple Wound Assessment / Reassessment - one wound 1 5 []  - 0 Complex Wound Assessment / Reassessment - multiple wounds []  - 0 Dermatologic / Skin  Assessment (not related to wound area) ASSESSMENTS - Focused Assessment []  - Circumferential Edema Measurements - multi extremities 0 []  - 0 Nutritional Assessment / Counseling / Intervention []  - 0 Lower Extremity Assessment (monofilament, tuning fork, pulses) []  - 0 Peripheral Arterial Disease Assessment (using hand held doppler) ASSESSMENTS - Ostomy and/or Continence Assessment and Care []  - Incontinence Assessment and Management 0 []  - 0 Ostomy Care Assessment and Management (repouching, etc.) PROCESS - Coordination of Care X - Simple Patient / Family Education for ongoing care 1 15 []  - 0 Complex (extensive) Patient / Family Education for ongoing care []  - 0 Staff obtains Chiropractor, Records, Test Results / Process Orders []  - 0 Staff telephones HHA, Nursing Homes / Clarify orders / etc []  - 0 Routine Transfer to another Facility (non-emergent condition) []  - 0 Routine Hospital Admission (non-emergent condition) []  - 0 New Admissions / Manufacturing engineer / Ordering NPWT, Apligraf, etc. []  - 0 Emergency Hospital Admission (emergent condition) X- 1 10 Simple Discharge Coordination Coghlan, Sruthi R. ([id number]) []  - 0 Complex (extensive) Discharge Coordination PROCESS - Special Needs []  - Pediatric / Minor Patient Management 0 []  - 0 Isolation Patient Management []  - 0 Hearing / Language / Visual special needs []  - 0 Assessment of Community assistance (transportation, D/C planning, etc.) []  - 0 Additional assistance / Altered mentation []  - 0 Support Surface(s) Assessment (bed, cushion, seat, etc.) INTERVENTIONS - Wound Cleansing / Measurement X - Simple Wound Cleansing - one wound 1 5 []  - 0 Complex Wound Cleansing - multiple wounds X- 1 5 Wound Imaging (photographs - any number of wounds) []  - 0 Wound Tracing (instead of photographs) X- 1 5 Simple Wound Measurement - one wound []  - 0 Complex Wound Measurement - multiple wounds INTERVENTIONS - Wound  Dressings X - Small Wound Dressing one or multiple wounds 1 10 []  - 0 Medium Wound Dressing one or multiple wounds []  - 0 Large Wound Dressing one or  multiple wounds []  - 0 Application of Medications - topical []  - 0 Application of Medications - injection INTERVENTIONS - Miscellaneous []  - External ear exam 0 []  - 0 Specimen Collection (cultures, biopsies, blood, body fluids, etc.) []  - 0 Specimen(s) / Culture(s) sent or taken to Lab for analysis []  - 0 Patient Transfer (multiple staff / Michiel SitesHoyer Lift / Similar devices) []  - 0 Simple Staple / Suture removal (25 or less) []  - 0 Complex Staple / Suture removal (26 or more) []  - 0 Hypo / Hyperglycemic Management (close monitor of Blood Glucose) []  - 0 Ankle / Brachial Index (ABI) - do not check if billed separately X- 1 5 Vital Signs Kading, Samanta R. ([id number]) Has the patient been seen at the hospital within the last three years: Yes Total Score: 65 Level Of Care: New/Established - Level 2 Electronic Signature(s) Signed: 12/18/2017 2:06:29 PM By: Renne CriglerFlinchum, Cheryl Entered By: Renne CriglerFlinchum, Cheryl on 12/18/2017 09:35:53 Carrozza, Hiilei R. ([id number]) -------------------------------------------------------------------------------- Compression Therapy Details Patient Name: Jessica CaseyLEATH, [name] Date of Service: 12/18/2017 8:45 AM Medical Record Number: [id number] Patient Account Number: [id number] Date of Birth/Sex: 1981-01-26 (37 y.o. Female) Treating RN: Renne CriglerFlinchum, Cheryl Primary Care Shaunette Gassner: PATIENT, NO Other Clinician: Referring Chaeli Judy: Referral, Self Treating Shaima Sardinas/Extender: Kathreen Cosieroulter, Leah Weeks in Treatment: 14 Compression Therapy Performed for Wound Assessment: Wound #2 Right,Distal,Lateral Lower Leg Performed By: Clinician Renne CriglerFlinchum, Cheryl, RN Compression Type: Four Layer Electronic Signature(s) Signed: 12/18/2017 2:06:29 PM By: Renne CriglerFlinchum, Cheryl Entered By: Renne CriglerFlinchum, Cheryl on 12/18/2017 09:34:02 Kam, Cristal DeerERICA R.  ([id number]) -------------------------------------------------------------------------------- Encounter Discharge Information Details Patient Name: Jessica CaseyLEATH, [place] R. Date of Service: 12/18/2017 8:45 AM Medical Record Number: [id number] Patient Account Number: [id number] Date of Birth/Sex: 1981-01-26 (37 y.o. Female) Treating RN: Renne CriglerFlinchum, Cheryl Primary Care Maysun Meditz: PATIENT, NO Other Clinician: Referring Jakyria Bleau: Referral, Self Treating Delontae Lamm/Extender: Kathreen Cosieroulter, Leah Weeks in Treatment: 14 Encounter Discharge Information Items Discharge Pain Level: 0 Discharge Condition: Stable Ambulatory Status: Ambulatory Discharge Destination: Home Private Transportation: Auto Accompanied By: self Schedule Follow-up Appointment: Yes Medication Reconciliation completed and No provided to Patient/Care Candice Tobey: Clinical Summary of Care: Electronic Signature(s) Signed: 12/18/2017 2:06:29 PM By: Renne CriglerFlinchum, Cheryl Entered By: Renne CriglerFlinchum, Cheryl on 12/18/2017 09:35:29 Zaffino, Cristal DeerERICA R. ([id number]) -------------------------------------------------------------------------------- Patient/Caregiver Education Details Patient Name: Marlaine HindLEATH, Jacquline R. Date of Service: 12/18/2017 8:45 AM Medical Record Number: [id number] Patient Account Number: [id number] Date of Birth/Gender: 1981-01-26 (37 y.o. Female) Treating RN: Renne CriglerFlinchum, Cheryl Primary Care Physician: PATIENT, NO Other Clinician: Referring Physician: Referral, Self Treating Physician/Extender: Kathreen Cosieroulter, Leah Weeks in Treatment: 14 Education Assessment Education Provided To: Patient Education Topics Provided Wound/Skin Impairment: Handouts: Caring for Your Ulcer Methods: Explain/Verbal Responses: State content correctly Electronic Signature(s) Signed: 12/18/2017 2:06:29 PM By: Renne CriglerFlinchum, Cheryl Entered By: Renne CriglerFlinchum, Cheryl on 12/18/2017 09:35:12 Madewell, Shawndra R.  ([id number]) -------------------------------------------------------------------------------- Wound Assessment Details Patient Name: Jessica CaseyLEATH, Anshika R. Date of Service: 12/18/2017 8:45 AM Medical Record Number: [id number] Patient Account Number: [id number] Date of Birth/Sex: 1981-01-26 (37 y.o. Female) Treating RN: Renne CriglerFlinchum, Cheryl Primary Care Saniyah Mondesir: PATIENT, NO Other Clinician: Referring Kelliann Pendergraph: Referral, Self Treating Kamaile Zachow/Extender: Kathreen Cosieroulter, Leah Weeks in Treatment: 14 Wound Status Wound Number: 2 Primary Etiology: Lymphedema Wound Location: Right Lower Leg - Lateral, Distal Wound Status: Open Wounding Event: Gradually Appeared Comorbid History: Lymphedema, Hypertension Date Acquired: 08/08/2017 Weeks Of Treatment: 14 Clustered Wound: No Wound Measurements Length: (cm) 4.5 Width: (cm) 4.6 Depth: (cm) 0.1 Area: (cm) 16.258 Volume: (cm) 1.626 % Reduction in Area: 24.2% % Reduction in Volume: 74.7% Epithelialization: Small (1-33%) Tunneling: No Undermining: No Wound Description Full Thickness With  Exposed Support Classification: Structures Wound Margin: Flat and Intact Exudate Medium Amount: Exudate Type: Serosanguineous Exudate Color: red, brown Foul Odor After Cleansing: No Slough/Fibrino Yes Wound Bed Granulation Amount: Large (67-100%) Exposed Structure Granulation Quality: Red Fascia Exposed: No Necrotic Amount: Small (1-33%) Fat Layer (Subcutaneous Tissue) Exposed: Yes Necrotic Quality: Adherent Slough Tendon Exposed: No Muscle Exposed: No Joint Exposed: No Bone Exposed: No Periwound Skin Texture Texture Color No Abnormalities Noted: No No Abnormalities Noted: Yes Callus: No Temperature / Pain Crepitus: No Temperature: No Abnormality Excoriation: No Tenderness on Palpation: Yes Induration: No Rash: No Scarring: Yes Moisture No Abnormalities Noted: No Dry / Scaly: No Renegar, Ninel R. ([id number]) Maceration: No Wound Preparation Ulcer  Cleansing: Rinsed/Irrigated with Saline, Other: soap and water, Topical Anesthetic Applied: None Treatment Notes Wound #2 (Right, Distal, Lateral Lower Leg) 1. Cleansed with: Cleanse wound with antibacterial soap and water 4. Dressing Applied: Prisma Ag 5. Secondary Dressing Applied Non-Adherent pad 7. Secured with 4-Layer Compression System - Right Lower Extremity Notes xtrasorb, unna to anchor Electronic Signature(s) Signed: 12/18/2017 2:06:29 PM By: Renne Crigler Entered By: Renne Crigler on 12/18/2017 09:33:30

## 2017-12-21 ENCOUNTER — Encounter: Payer: Self-pay | Attending: Physician Assistant | Admitting: Physician Assistant

## 2017-12-21 DIAGNOSIS — I10 Essential (primary) hypertension: Secondary | ICD-10-CM | POA: Insufficient documentation

## 2017-12-21 DIAGNOSIS — I890 Lymphedema, not elsewhere classified: Secondary | ICD-10-CM | POA: Insufficient documentation

## 2017-12-21 DIAGNOSIS — L97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d: Secondary | ICD-10-CM | POA: Insufficient documentation

## 2017-12-21 DIAGNOSIS — I87311 Chronic venous hypertension (idiopathic) with ulcer of right lower extremity: Secondary | ICD-10-CM | POA: Insufficient documentation

## 2017-12-23 NOTE — Progress Notes (Addendum)
[name] ([id number]) Visit Report for 12/21/2017 Chief Complaint Document Details Patient Name: [name], [name] Date of Service: 12/21/2017 8:45 AM Medical Record Number: [id number] Patient Account Number: [id number] Date of Birth/Sex: Dec 22, 1980 (37 y.o. Female) Treating RN: Ashok Cordia, Debi Primary Care Provider: PATIENT, NO Other Clinician: Referring Provider: Referral, Self Treating Provider/Extender: STONE III, Markiyah Gahm Weeks in Treatment: 14 Information Obtained from: Patient Chief Complaint She is here in follow up evaluation of right lower extremity ulcers Electronic Signature(s) Signed: 12/21/2017 12:52:48 PM By: Lenda Kelp PA-C Entered By: Lenda Kelp on 12/21/2017 09:13:31 Ballinger, [name] ([id number]) -------------------------------------------------------------------------------- Debridement Details Patient Name: [name] R. Date of Service: 12/21/2017 8:45 AM Medical Record Number: [id number] Patient Account Number: [id number] Date of Birth/Sex: 24-Jun-1981 (37 y.o. Female) Treating RN: Ashok Cordia, Debi Primary Care Provider: PATIENT, NO Other Clinician: Referring Provider: Referral, Self Treating Provider/Extender: STONE III, Shalea Tomczak Weeks in Treatment: 14 Debridement Performed for Wound #2 Right,Distal,Lateral Lower Leg Assessment: Performed By: Physician STONE III, Willet Schleifer E., PA-C Debridement: Debridement Pre-procedure Verification/Time Yes - 09:16 Out Taken: Start Time: 09:17 Pain Control: Lidocaine 4% Topical Solution Level: Skin/Subcutaneous Tissue Total Area Debrided (L x W): 2.5 (cm) x 3.7 (cm) = 9.25 (cm) Tissue and other material Viable, Non-Viable, Exudate, Fibrin/Slough, Subcutaneous debrided: Instrument: Curette Bleeding: Minimum Hemostasis Achieved: Pressure End Time: 09:20 Procedural Pain: 0 Post Procedural Pain: 0 Response to Treatment: Procedure was tolerated well Post Debridement Measurements of Total Wound Length: (cm) 2.5 Width: (cm)  3.7 Depth: (cm) 0.2 Volume: (cm) 1.453 Character of Wound/Ulcer Post Debridement: Requires Further Debridement Post Procedure Diagnosis Same as Pre-procedure Electronic Signature(s) Signed: 12/21/2017 12:52:48 PM By: Lenda Kelp PA-C Signed: 12/21/2017 4:17:47 PM By: [name] Entered By: [name] on 12/21/2017 09:20:06 Buckner, [name] ([id number]) -------------------------------------------------------------------------------- HPI Details Patient Name: [name] R. Date of Service: 12/21/2017 8:45 AM Medical Record Number: [id number] Patient Account Number: [id number] Date of Birth/Sex: 1981-08-26 (37 y.o. Female) Treating RN: Ashok Cordia, Debi Primary Care Provider: PATIENT, NO Other Clinician: Referring Provider: Referral, Self Treating Provider/Extender: STONE III, Chrisie Jankovich Weeks in Treatment: 14 History of Present Illness HPI Description: 37 year old patient well known to our [facility] wound care clinic where she has been seen since 2016 for bilateral lower extremity venous insufficiency disease with lymphedema and multiple ulcerations associated with morbid obesity. she had custom-made compression stockings and lymphedema pumps which were used in the past. most recently she was admitted to the hospital between October 11 and 09/02/2017 with sepsis, lower extremity wounds and lymphedema.she was initially treated in the outpatient with Keflex and Bactrim. she was initially treated in the hospital with vancomycin and Zosyn and changed over to Unasyn until her white count improved and her blood cultures were negative for 3 days. After her inpatient management she was discharged home on Augmentin to end on 09/13/2017 with a 14 day course. she has had outpatient vascular duplex scans completed in November 2017 and her right ABI was 1.1 and the left ABI is 1.3. she had normal toe brachial indices bilaterally.she had three-vessel runoff in the right lower extremity and  two-vessel runoff in the left lower extremity. On questioning the patient she does have custom made compression stockings and also has a lymphedema pump but has not been using it appropriately and has not been taking good care of herself. 09/17/2017 -- she returns today with compression stockings on the left side and the right side has had significant amount of drainage and has a very strong odor 09/24/2017 -- the  drainage is increased significantly and she has more lymphedema and a very strong odor to her wound. Though she does not have systemic symptoms, or overt infection I believe she will benefit from some doxycycline given empirically. 10/01/2017 -- after starting the doxycycline and changing the dressing twice a week her symptoms and signs have definitely improved overall. 10/08/2017 -- she has completed her course of doxycycline but continues to have a lot of drainage and needs twice a week dressing changes. 11/08/17-she is here in follow-up evaluation for right lower extremity ulcers. She admits to using her lymphedema pumps twice daily, one hour per session. she is voicing no complaints or concerns, no signs of infection [facility]17 on evaluation today patient appears to be doing very well in regard to her wounds. She has been tolerating the dressing changes she continues to develop some portly the adherent granular tissue on the surface of the wound with some Slough. Obviously we are trying to get too much better wound bed. With that being said the hyper granulation the Hydrofera Blue Dressing to have helped with which is excellent news. However I think it may be time to try something a little bit different at this point. 12/21/17 on evaluation today patient appears to be doing much better in my opinion with the Prisma. She has been tolerating the dressing changes and overall the granulation appears to be more firmly adhered and is doing very well. I do think that  the switch to the collagen from the [facility], Theydrofera Blue Dressing was of great benefit for her over the last week. She is having no discomfort and states this does feel much better on top of it looking better this is obviously excellent news. Electronic Signature(s) Signed: 12/21/2017 12:52:48 PM By: Lenda KelpStone III, Edras Wilford PA-C Entered By: Lenda KelpStone III, Bobbette Eakes on 12/21/2017 09:22:25 [name], [name] ([id number]) -------------------------------------------------------------------------------- Physical Exam Details Patient Name: [name], [name] Date of Service: 12/21/2017 8:45 AM Medical Record Number: [id number] Patient Account Number: [id number] Date of Birth/Sex: 05/21/1981 (37 y.o. Female) Treating RN: Ashok CordiaPinkerton, Debi Primary Care Provider: PATIENT, NO Other Clinician: Referring Provider: Referral, Self Treating Provider/Extender: STONE III, Kayla Deshaies Weeks in Treatment: 14 Constitutional Obese and well-hydrated in no acute distress. Respiratory normal breathing without difficulty. clear to auscultation bilaterally. Cardiovascular regular rate and rhythm with normal S1, S2. Psychiatric this patient is able to make decisions and demonstrates good insight into disease process. Alert and Oriented x 3. pleasant and cooperative. Notes Patient's wound bed did have some Slough on the surface but fortunately this appeared to be doing very well in general. I did perform sharp debridement today utilizing a curette which patient tolerated with no pain and post debridement the granulation bed appear to be much better. Even then in previous weeks. Electronic Signature(s) Signed: 12/21/2017 12:52:48 PM By: Lenda KelpStone III, Trena Dunavan PA-C Entered By: Lenda KelpStone III, Anthonee Gelin on 12/21/2017 09:23:23 [name], [name] ([id number]) -------------------------------------------------------------------------------- Physician Orders Details Patient Name: [name], [name] Date of Service: 12/21/2017 8:45 AM Medical Record Number: [id number] Patient Account  Number: [id number] Date of Birth/Sex: 05/21/1981 (37 y.o. Female) Treating RN: Ashok CordiaPinkerton, Debi Primary Care Provider: PATIENT, NO Other Clinician: Referring Provider: Referral, Self Treating Provider/Extender: STONE III, Quanesha Klimaszewski Weeks in Treatment: 14 Verbal / Phone Orders: Yes Clinician: Pinkerton, Debi Read Back and Verified: Yes Diagnosis Coding ICD-10 Coding Code Description 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d I89.0 Lymphedema, not elsewhere classified I87.311 Chronic venous hypertension (idiopathic) with ulcer of right lower extremity  E66.01 Morbid (severe) obesity due to excess calories Wound Cleansing Wound #2 Right,Distal,Lateral Lower Leg o Clean wound with Normal Saline. o Cleanse wound with mild soap and water Anesthetic (add to Medication List) Wound #2 Right,Distal,Lateral Lower Leg o Topical Lidocaine 4% cream applied to wound bed prior to debridement (In Clinic Only). Skin Barriers/Peri-Wound Care Wound #2 Right,Distal,Lateral Lower Leg o Barrier cream o Moisturizing lotion Primary Wound Dressing Wound #2 Right,Distal,Lateral Lower Leg o Prisma Ag Secondary Dressing Wound #2 Right,Distal,Lateral Lower Leg o ABD pad o Non-adherent pad o XtraSorb o Other - charcoal Dressing Change Frequency Wound #2 Right,Distal,Lateral Lower Leg o Change dressing every week Follow-up Appointments o Return Appointment in 1 week. o Nurse Visit as needed Edema Control Slomski, Franceen R. ([id number]) Wound #2 Right,Distal,Lateral Lower Leg o 4-Layer Compression System - Right Lower Extremity - unna to anchor o Elevate legs to the level of the heart and pump ankles as often as possible Additional Orders / Instructions Wound #2 Right,Distal,Lateral Lower Leg o Vitamin A; Vitamin C, Zinc o Increase protein intake. o Other: - Please watch your salt (sodium) intake Electronic Signature(s) Signed: 12/21/2017  12:52:48 PM By: Lenda Kelp PA-C Signed: 12/21/2017 4:17:47 PM By: [name] Entered By: [name] on 12/21/2017 10:53:35 Degen, Syrianna R. ([id number]) -------------------------------------------------------------------------------- Problem List Details Patient Name: [name] R. Date of Service: 12/21/2017 8:45 AM Medical Record Number: [id number] Patient Account Number: [id number] Date of Birth/Sex: 09-Aug-1981 (38 y.o. Female) Treating RN: Ashok Cordia, Debi Primary Care Provider: PATIENT, NO Other Clinician: Referring Provider: Referral, Self Treating Provider/Extender: STONE III, Sander Remedios Weeks in Treatment: 14 Active Problems ICD-10 Encounter Code Description Active Date Diagnosis L97.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d 09/10/2017 Yes I89.0 Lymphedema, not elsewhere classified 09/10/2017 Yes I87.311 Chronic venous hypertension (idiopathic) with ulcer of right lower 09/10/2017 Yes extremity E66.01 Morbid (severe) obesity due to excess calories 09/10/2017 Yes Inactive Problems Resolved Problems Electronic Signature(s) Signed: 12/21/2017 12:52:48 PM By: Lenda Kelp PA-C Entered By: Lenda Kelp on 12/21/2017 09:13:14 Briles, Karmel R. ([id number]) -------------------------------------------------------------------------------- Progress Note Details Patient Name: [name] R. Date of Service: 12/21/2017 8:45 AM Medical Record Number: [id number] Patient Account Number: [id number] Date of Birth/Sex: 03/26/1981 (37 y.o. Female) Treating RN: Ashok Cordia, Debi Primary Care Provider: PATIENT, NO Other Clinician: Referring Provider: Referral, Self Treating Provider/Extender: STONE III, Jacion Dismore Weeks in Treatment: 14 Subjective Chief Complaint Information obtained from Patient She is here in follow up evaluation of right lower extremity ulcers History of Present Illness (HPI) 37 year old patient well known to our [facility] wound care clinic  where she has been seen since 2016 for bilateral lower extremity venous insufficiency disease with lymphedema and multiple ulcerations associated with morbid obesity. she had custom-made compression stockings and lymphedema pumps which were used in the past. most recently she was admitted to the hospital between October 11 and 09/02/2017 with sepsis, lower extremity wounds and lymphedema.she was initially treated in the outpatient with Keflex and Bactrim. she was initially treated in the hospital with vancomycin and Zosyn and changed over to Unasyn until her white count improved and her blood cultures were negative for 3 days. After her inpatient management she was discharged home on Augmentin to end on 09/13/2017 with a 14 day course. she has had outpatient vascular duplex scans completed in November 2017 and her right ABI was 1.1 and the left ABI is 1.3. she had normal toe brachial indices bilaterally.she had three-vessel runoff in the right lower extremity and two-vessel runoff  in the left lower extremity. On questioning the patient she does have custom made compression stockings and also has a lymphedema pump but has not been using it appropriately and has not been taking good care of herself. 09/17/2017 -- she returns today with compression stockings on the left side and the right side has had significant amount of drainage and has a very strong odor 09/24/2017 -- the drainage is increased significantly and she has more lymphedema and a very strong odor to her wound. Though she does not have systemic symptoms, or overt infection I believe she will benefit from some doxycycline given empirically. 10/01/2017 -- after starting the doxycycline and changing the dressing twice a week her symptoms and signs have definitely improved overall. 10/08/2017 -- she has completed her course of doxycycline but continues to have a lot of drainage and needs twice a week dressing changes. 11/08/17-she is  here in follow-up evaluation for right lower extremity ulcers. She admits to using her lymphedema pumps twice daily, one hour per session. she is voicing no complaints or concerns, no signs of infection will change to [facility] 12/14/17 on evaluation today patient appears to be doing very well in regard to her wounds. She has been tolerating the dressing changes she continues to develop some portly the adherent granular tissue on the surface of the wound with some Slough. Obviously we are trying to get too much better wound bed. With that being said the hyper granulation the Hydrofera Blue Dressing to have helped with which is excellent news. However I think it may be time to try something a little bit different at this point. 12/21/17 on evaluation today patient appears to be doing much better in my opinion with the Prisma. She has been tolerating the dressing changes and overall the granulation appears to be more firmly adhered and is doing very well. I do think that the switch to the collagen from the [facility] Dressing was of great benefit for her over the last week. She is having no discomfort and states this does feel much better on top of it looking better this is obviously excellent news. [name], [name] ([id number]) Patient History Information obtained from Patient. Family History Kidney Disease - Mother, No family history of Cancer, Diabetes, Heart Disease, Hereditary Spherocytosis, Hypertension, Lung Disease, Seizures, Stroke, Thyroid Problems, Tuberculosis. Social History Never smoker, Marital Status - Married, Alcohol Use - Never, Drug Use - No History, Caffeine Use - Daily. Review of Systems (ROS) Constitutional Symptoms (General Health) Denies complaints or symptoms of Fever, Chills. Respiratory The patient has no complaints or symptoms. Cardiovascular Complains or has symptoms of LE edema. Psychiatric The patient has no complaints or  symptoms. Objective Constitutional Obese and well-hydrated in no acute distress. Vitals Time Taken: 9:06 AM, Height: 74 in, Weight: 505 lbs, BMI: 64.8, Temperature: 97.8 F, Pulse: 89 bpm, Respiratory Rate: 18 breaths/min, Blood Pressure: 140/74 mmHg. Respiratory normal breathing without difficulty. clear to auscultation bilaterally. Cardiovascular regular rate and rhythm with normal S1, S2. Psychiatric this patient is able to make decisions and demonstrates good insight into disease process. Alert and Oriented x 3. pleasant and cooperative. General Notes: Patient's wound bed did have some Slough on the surface but fortunately this appeared to be doing very well in general. I did perform sharp debridement today utilizing a curette which patient tolerated with no pain and post debridement the granulation bed appear to be much better. Even then in previous weeks. Integumentary (Hair, Skin) Wound #2 status is Open.  Original cause of wound was Gradually Appeared. The wound is located on the Right,Distal,Lateral Lower Leg. The wound measures 2.5cm length x 3.7cm width x 0.1cm depth; 7.265cm^2 area and 0.726cm^3 volume. There is Fat Layer (Subcutaneous Tissue) Exposed exposed. There is no tunneling or undermining noted. There is a medium amount of serosanguineous drainage noted. The wound margin is flat and intact. There is large (67-100%) red granulation within the wound bed. There is a small (1-33%) amount of necrotic tissue within the wound bed including Adherent Slough. [name], [name] ([id number]) The periwound skin appearance had no abnormalities noted for color. The periwound skin appearance exhibited: Scarring. The periwound skin appearance did not exhibit: Callus, Crepitus, Excoriation, Induration, Rash, Dry/Scaly, Maceration. Periwound temperature was noted as No Abnormality. The periwound has tenderness on palpation. Assessment Active Problems ICD-10 L97.212 - Non-pressure chronic  ulcer of right calf with fat layer exposed I89.0 - Lymphedema, not elsewhere classified I87.311 - Chronic venous hypertension (idiopathic) with ulcer of right lower extremity E66.01 - Morbid (severe) obesity due to excess calories Procedures Wound #2 Pre-procedure diagnosis of Wound #2 is a Lymphedema located on the Right,Distal,Lateral Lower Leg . There was a Skin/Subcutaneous Tissue Debridement ([id number]) debridement with total area of 9.25 sq cm performed by STONE III, Darrel Baroni E., PA-C. with the following instrument(s): Curette to remove Viable and Non-Viable tissue/material including Exudate, Fibrin/Slough, and Subcutaneous after achieving pain control using Lidocaine 4% Topical Solution. A time out was conducted at 09:16, prior to the start of the procedure. A Minimum amount of bleeding was controlled with Pressure. The procedure was tolerated well with a pain level of 0 throughout and a pain level of 0 following the procedure. Post Debridement Measurements: 2.5cm length x 3.7cm width x 0.2cm depth; 1.453cm^3 volume. Character of Wound/Ulcer Post Debridement requires further debridement. Post procedure Diagnosis Wound #2: Same as Pre-Procedure Plan Wound Cleansing: Wound #2 Right,Distal,Lateral Lower Leg: Clean wound with Normal Saline. Cleanse wound with mild soap and water Anesthetic (add to Medication List): Wound #2 Right,Distal,Lateral Lower Leg: Topical Lidocaine 4% cream applied to wound bed prior to debridement (In Clinic Only). Skin Barriers/Peri-Wound Care: Wound #2 Right,Distal,Lateral Lower Leg: Barrier cream Moisturizing lotion Primary Wound Dressing: Wound #2 Right,Distal,Lateral Lower Leg: Prisma Ag Secondary Dressing: Wound #2 Right,Distal,Lateral Lower Leg: [name], [name] ([id number]) ABD pad Non-adherent pad XtraSorb Other - charcoal Dressing Change Frequency: Wound #2 Right,Distal,Lateral Lower Leg: Change dressing every week Follow-up  Appointments: Return Appointment in 1 week. Nurse Visit as needed Edema Control: Wound #2 Right,Distal,Lateral Lower Leg: 4-Layer Compression System - Right Lower Extremity - unna to anchor Elevate legs to the level of the heart and pump ankles as often as possible Additional Orders / Instructions: Wound #2 Right,Distal,Lateral Lower Leg: Vitamin A; Vitamin C, Zinc Increase protein intake. Other: - Please watch your salt (sodium) intake I am going to recommend that we continue with the Current wound care measures with the Prisma for the next week. I'm pleased with how things have progressed and hopefully this will continue to do well. Please see above for specific wound care orders. We will see patient for re-evaluation in 1 week(s) here in the clinic. If anything worsens or changes patient will contact our office for additional recommendations. Electronic Signature(s) Signed: 01/08/2018 10:42:09 AM By: Lenda Kelp PA-C Previous Signature: 12/21/2017 12:52:48 PM Version By: Lenda Kelp PA-C Entered By: Lenda Kelp on 01/08/2018 08:14:36 [name], [name] ([id number]) -------------------------------------------------------------------------------- ROS/PFSH Details Patient Name: [name] R. Date of Service:  12/21/2017 8:45 AM Medical Record Number: [id number] Patient Account Number: [id number] Date of Birth/Sex: 1981-05-29 (36 y.o. Female) Treating RN: Ashok Cordia, Debi Primary Care Provider: PATIENT, NO Other Clinician: Referring Provider: Referral, Self Treating Provider/Extender: STONE III, Tanai Bouler Weeks in Treatment: 14 Information Obtained From Patient Wound History Do you currently have one or more open woundso Yes How many open wounds do you currently haveo 1 Approximately how long have you had your woundso 1 month How have you been treating your wound(s) until nowo aquacel ag Has your wound(s) ever healed and then re-openedo No Have you had any lab work done in the past  montho No Have you tested positive for an antibiotic resistant organism (MRSA, VRE)o No Have you tested positive for osteomyelitis (bone infection)o No Have you had any tests for circulation on your legso Yes Who ordered the testo [facility] Where was the test doneo [facility] Constitutional Symptoms (General Health) Complaints and Symptoms: Negative for: Fever; Chills Cardiovascular Complaints and Symptoms: Positive for: LE edema Medical History: Positive for: Hypertension Negative for: Angina; Arrhythmia; Congestive Heart Failure; Coronary Artery Disease; Deep Vein Thrombosis; Hypotension; Myocardial Infarction; Peripheral Arterial Disease; Peripheral Venous Disease; Phlebitis; Vasculitis Hematologic/Lymphatic Medical History: Positive for: Lymphedema Respiratory Complaints and Symptoms: No Complaints or Symptoms Medical History: Negative for: Aspiration; Asthma; Chronic Obstructive Pulmonary Disease (COPD); Pneumothorax; Sleep Apnea; Tuberculosis Psychiatric Complaints and Symptoms: No Complaints or Symptoms [name], [name] ([id number]) Immunizations Pneumococcal Vaccine: Received Pneumococcal Vaccination: No Immunization Notes: up to date Implantable Devices Family and Social History Cancer: No; Diabetes: No; Heart Disease: No; Hereditary Spherocytosis: No; Hypertension: No; Kidney Disease: Yes - Mother; Lung Disease: No; Seizures: No; Stroke: No; Thyroid Problems: No; Tuberculosis: No; Never smoker; Marital Status - Married; Alcohol Use: Never; Drug Use: No History; Caffeine Use: Daily; Financial Concerns: No; Food, Clothing or Shelter Needs: No; Support System Lacking: No; Transportation Concerns: No; Advanced Directives: No; Patient does not want information on Advanced Directives Physician Affirmation I have reviewed and agree with the above information. Electronic Signature(s) Signed: 12/21/2017 12:52:48 PM By: Lenda Kelp PA-C Signed: 12/21/2017 4:17:47 PM By: [name] Entered By: Lenda Kelp on 12/21/2017 09:22:46 [name], Davis RMarland Kitchen ([id number]) -------------------------------------------------------------------------------- SuperBill Details Patient Name: [name] R. Date of Service: 12/21/2017 Medical Record Number: [id number] Patient Account Number: [id number] Date of Birth/Sex: [birth year] (37 y.o. 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

## 2017-12-23 NOTE — Progress Notes (Signed)
Jessica, [name] ([id number]) Visit Report for 12/21/2017 Arrival Information Details Patient Name: [name], [name] Date of Service: 12/21/2017 8:45 AM Medical Record Number: [id number] Patient Account Number: [id number] Date of Birth/Sex: October 25, 1981 (37 y.o. Female) Treating RN: Ashok Cordia, Debi Primary Care Makhayla Mcmurry: PATIENT, NO Other Clinician: Referring Sariya Trickey: Referral, Self Treating Klyde Banka/Extender: STONE III, HOYT Weeks in Treatment: 14 Visit Information History Since Last Visit All ordered tests and consults were completed: No Patient Arrived: Ambulatory Added or deleted any medications: No Arrival Time: 09:03 Any new allergies or adverse reactions: No Accompanied By: self Had a fall or experienced change in No Transfer Assistance: None activities of daily living that may affect Patient Identification Verified: Yes risk of falls: Secondary Verification Process Completed: Yes Signs or symptoms of abuse/neglect since last visito No Patient Requires Transmission-Based No Hospitalized since last visit: No Precautions: Has Dressing in Place as Prescribed: Yes Patient Has Alerts: No Has Compression in Place as Prescribed: Yes Pain Present Now: No Electronic Signature(s) Signed: 12/21/2017 4:17:47 PM By: [name] Entered By: [name] on 12/21/2017 09:03:51 Doshier, Cristal Deer ([id number]) -------------------------------------------------------------------------------- Encounter Discharge Information Details Patient Name: Jessica Casey R. Date of Service: 12/21/2017 8:45 AM Medical Record Number: [id number] Patient Account Number: [id number] Date of Birth/Sex: 22-Sep-1981 (37 y.o. Female) Treating RN: Ashok Cordia, Debi Primary Care Mccade Sullenberger: PATIENT, NO Other Clinician: Referring Toniette Devera: Referral, Self Treating Akosua Constantine/Extender: STONE III, HOYT Weeks in Treatment: 14 Encounter Discharge Information Items Discharge Pain Level: 0 Discharge Condition: Stable Ambulatory  Status: Ambulatory Discharge Destination: Home Transportation: Private Auto Accompanied By: self Schedule Follow-up Appointment: Yes Medication Reconciliation completed and No provided to Patient/Care Alven Alverio: Provided on Clinical Summary of Care: 01/18/2018 Form Type Recipient Paper Patient EL Electronic Signature(s) Signed: 12/21/2017 4:27:15 PM By: Gwenlyn Perking Entered By: Gwenlyn Perking on 12/21/2017 09:35:48 Hauk, Ranelle R. ([id number]) -------------------------------------------------------------------------------- Lower Extremity Assessment Details Patient Name: Jessica Casey R. Date of Service: 12/21/2017 8:45 AM Medical Record Number: [id number] Patient Account Number: [id number] Date of Birth/Sex: 04-05-1981 (37 y.o. Female) Treating RN: Ashok Cordia, Debi Primary Care Moe Brier: PATIENT, NO Other Clinician: Referring Noor Vidales: Referral, Self Treating Ciera Beckum/Extender: STONE III, HOYT Weeks in Treatment: 14 Edema Assessment Assessed: [Left: No] [Right: No] [Left: Edema] [Right: :] Calf Left: Right: Point of Measurement: 35 cm From Medial Instep cm 57 cm Ankle Left: Right: Point of Measurement: 9 cm From Medial Instep cm 36.1 cm Vascular Assessment Pulses: Dorsalis Pedis Palpable: [Right:Yes] Posterior Tibial Extremity colors, hair growth, and conditions: Extremity Color: [Right:Hyperpigmented] Temperature of Extremity: [Right:Warm] Capillary Refill: [Right:< 3 seconds] Toe Nail Assessment Left: Right: Thick: Yes Discolored: Yes Deformed: Yes Improper Length and Hygiene: Yes Electronic Signature(s) Signed: 12/21/2017 4:17:47 PM By: [name] Entered By: [name] on 12/21/2017 09:14:12 Elsbernd, Stefannie R. ([id number]) -------------------------------------------------------------------------------- Multi Wound Chart Details Patient Name: Jessica Casey R. Date of Service: 12/21/2017 8:45 AM Medical Record Number: [id number] Patient Account Number:  [id number] Date of Birth/Sex: 24-Feb-1981 (37 y.o. Female) Treating RN: Ashok Cordia, Debi Primary Care Viviane Semidey: PATIENT, NO Other Clinician: Referring Elisa Kutner: Referral, Self Treating Lekeshia Kram/Extender: STONE III, HOYT Weeks in Treatment: 14 Vital Signs Height(in): 74 Pulse(bpm): 89 Weight(lbs): 505 Blood Pressure(mmHg): 140/74 Body Mass Index(BMI): 65 Temperature(F): 97.8 Respiratory Rate 18 (breaths/min): Photos: [2:No Photos] [N/A:N/A] Wound Location: [2:Right Lower Leg - Lateral, Distal] [N/A:N/A] Wounding Event: [2:Gradually Appeared] [N/A:N/A] Primary Etiology: [2:Lymphedema] [N/A:N/A] Comorbid History: [2:Lymphedema, Hypertension] [N/A:N/A] Date Acquired: [2:08/08/2017] [N/A:N/A] Weeks of Treatment: [2:14] [N/A:N/A] Wound Status: [2:Open] [N/A:N/A] Measurements L x W x D [2:2.5x3.7x0.1] [N/A:N/A] (cm) Area (cm) : [2:7.265] [  N/A:N/A] Volume (cm) : [2:0.726] [N/A:N/A] % Reduction in Area: [2:66.10%] [N/A:N/A] % Reduction in Volume: [2:88.70%] [N/A:N/A] Classification: [2:Full Thickness With Exposed Support Structures] [N/A:N/A] Exudate Amount: [2:Medium] [N/A:N/A] Exudate Type: [2:Serosanguineous] [N/A:N/A] Exudate Color: [2:red, brown] [N/A:N/A] Wound Margin: [2:Flat and Intact] [N/A:N/A] Granulation Amount: [2:Large (67-100%)] [N/A:N/A] Granulation Quality: [2:Red] [N/A:N/A] Necrotic Amount: [2:Small (1-33%)] [N/A:N/A] Exposed Structures: [2:Fat Layer (Subcutaneous Tissue) Exposed: Yes Fascia: No Tendon: No Muscle: No Joint: No Bone: No] [N/A:N/A] Epithelialization: [2:Small (1-33%)] [N/A:N/A] Periwound Skin Texture: [2:Scarring: Yes Excoriation: No Induration: No Callus: No] [N/A:N/A] Crepitus: No Rash: No Periwound Skin Moisture: Maceration: No N/A N/A Dry/Scaly: No Periwound Skin Color: Atrophie Blanche: No N/A N/A Cyanosis: No Ecchymosis: No Erythema: No Hemosiderin Staining: No Mottled: No Pallor: No Rubor: No Temperature: No Abnormality N/A  N/A Tenderness on Palpation: Yes N/A N/A Wound Preparation: Ulcer Cleansing: N/A N/A Rinsed/Irrigated with Saline, Other: soap and water Topical Anesthetic Applied: Other: lidocaine 4% Treatment Notes Electronic Signature(s) Signed: 12/21/2017 4:17:47 PM By: Alejandro MullingPinkerton, Debra Entered By: Alejandro MullingPinkerton, Debra on 12/21/2017 09:16:01 Marlaine HindLEATH, Kitara R. ([id number]) -------------------------------------------------------------------------------- Multi-Disciplinary Care Plan Details Patient Name: Jessica CaseyLEATH, Mckinsley R. Date of Service: 12/21/2017 8:45 AM Medical Record Number: [id number] Patient Account Number: [id number] Date of Birth/Sex: 1981/04/30 (37 y.o. Female) Treating RN: Ashok CordiaPinkerton, Debi Primary Care Galen Russman: PATIENT, NO Other Clinician: Referring Chalise Pe: Referral, Self Treating Charnae Lill/Extender: STONE III, HOYT Weeks in Treatment: 14 Active Inactive ` Orientation to the Wound Care Program Nursing Diagnoses: Knowledge deficit related to the wound healing center program Goals: Patient/caregiver will verbalize understanding of the Wound Healing Center Program Date Initiated: 09/10/2017 Target Resolution Date: 11/23/2017 Goal Status: Active Interventions: Provide education on orientation to the wound center Notes: ` Venous Leg Ulcer Nursing Diagnoses: Knowledge deficit related to disease process and management Goals: Patient/caregiver will verbalize understanding of disease process and disease management Date Initiated: 09/10/2017 Target Resolution Date: 11/23/2017 Goal Status: Active Interventions: Assess peripheral edema status every visit. Notes: ` Wound/Skin Impairment Nursing Diagnoses: Impaired tissue integrity Goals: Ulcer/skin breakdown will heal within 14 weeks Date Initiated: 09/10/2017 Target Resolution Date: 11/24/2017 Goal Status: Active Interventions: Marlaine HindLEATH, Sharise R. ([id number]) Assess patient/caregiver ability to obtain necessary supplies Assess patient/caregiver  ability to perform ulcer/skin care regimen upon admission and as needed Assess ulceration(s) every visit Notes: Electronic Signature(s) Signed: 12/21/2017 4:17:47 PM By: Alejandro MullingPinkerton, Debra Entered By: Alejandro MullingPinkerton, Debra on 12/21/2017 09:15:52 Brow, Aithana R. ([id number]) -------------------------------------------------------------------------------- Pain Assessment Details Patient Name: Jessica CaseyLEATH, Amilliana R. Date of Service: 12/21/2017 8:45 AM Medical Record Number: [id number] Patient Account Number: [id number] Date of Birth/Sex: 1981/04/30 (37 y.o. Female) Treating RN: Ashok CordiaPinkerton, Debi Primary Care Naja Apperson: PATIENT, NO Other Clinician: Referring Jamita Mckelvin: Referral, Self Treating Tamirah George/Extender: STONE III, HOYT Weeks in Treatment: 14 Active Problems Location of Pain Severity and Description of Pain Patient Has Paino No Site Locations Pain Management and Medication Current Pain Management: Electronic Signature(s) Signed: 12/21/2017 4:17:47 PM By: Alejandro MullingPinkerton, Debra Entered By: Alejandro MullingPinkerton, Debra on 12/21/2017 09:04:09 Marlaine HindLEATH, Danayah R. ([id number]) -------------------------------------------------------------------------------- Patient/Caregiver Education Details Patient Name: Jessica CaseyLEATH, Cenia R. Date of Service: 12/21/2017 8:45 AM Medical Record Number: [id number] Patient Account Number: [id number] Date of Birth/Gender: 1981/04/30 (37 y.o. Female) Treating RN: Ashok CordiaPinkerton, Debi Primary Care Physician: PATIENT, NO Other Clinician: Referring Physician: Referral, Self Treating Physician/Extender: Linwood DibblesSTONE III, HOYT Weeks in Treatment: 14 Education Assessment Education Provided To: Patient Education Topics Provided Wound/Skin Impairment: Handouts: Caring for Your Ulcer, Other: change dressing as ordered Methods: Demonstration, Explain/Verbal Responses: State content correctly Electronic Signature(s) Signed: 12/21/2017 4:17:47 PM By: Alejandro MullingPinkerton, Debra Entered By: Ashok CordiaPinkerton,  Debra on 12/21/2017  09:17:07 CAMORA, TREMAIN RMarland Kitchen ([id number]) -------------------------------------------------------------------------------- Wound Assessment Details Patient Name: CHERYLL, KEISLER. Date of Service: 12/21/2017 8:45 AM Medical Record Number: [id number] Patient Account Number: [id number] Date of Birth/Sex: 22-Oct-1981 (37 y.o. Female) Treating RN: Ashok Cordia, Debi Primary Care Shaneil Yazdi: PATIENT, NO Other Clinician: Referring Ion Gonnella: Referral, Self Treating Liboria Putnam/Extender: STONE III, HOYT Weeks in Treatment: 14 Wound Status Wound Number: 2 Primary Etiology: Lymphedema Wound Location: Right Lower Leg - Lateral, Distal Wound Status: Open Wounding Event: Gradually Appeared Comorbid History: Lymphedema, Hypertension Date Acquired: 08/08/2017 Weeks Of Treatment: 14 Clustered Wound: No Photos Photo Uploaded By: [name] on 12/21/2017 13:45:50 Wound Measurements Length: (cm) 2.5 Width: (cm) 3.7 Depth: (cm) 0.1 Area: (cm) 7.265 Volume: (cm) 0.726 % Reduction in Area: 66.1% % Reduction in Volume: 88.7% Epithelialization: Small (1-33%) Tunneling: No Undermining: No Wound Description Full Thickness With Exposed Support Classification: Structures Wound Margin: Flat and Intact Exudate Medium Amount: Exudate Type: Serosanguineous Exudate Color: red, brown Foul Odor After Cleansing: No Slough/Fibrino Yes Wound Bed Granulation Amount: Large (67-100%) Exposed Structure Granulation Quality: Red Fascia Exposed: No Necrotic Amount: Small (1-33%) Fat Layer (Subcutaneous Tissue) Exposed: Yes Necrotic Quality: Adherent Slough Tendon Exposed: No Muscle Exposed: No Joint Exposed: No Bone Exposed: No Adel, Montez R. ([id number]) Periwound Skin Texture Texture Color No Abnormalities Noted: No No Abnormalities Noted: Yes Callus: No Temperature / Pain Crepitus: No Temperature: No Abnormality Excoriation: No Tenderness on Palpation: Yes Induration: No Rash: No Scarring:  Yes Moisture No Abnormalities Noted: No Dry / Scaly: No Maceration: No Wound Preparation Ulcer Cleansing: Rinsed/Irrigated with Saline, Other: soap and water, Topical Anesthetic Applied: Other: lidocaine 4%, Treatment Notes Wound #2 (Right, Distal, Lateral Lower Leg) 1. Cleansed with: Clean wound with Normal Saline Cleanse wound with antibacterial soap and water 2. Anesthetic Topical Lidocaine 4% cream to wound bed prior to debridement 3. Peri-wound Care: Barrier cream Moisturizing lotion 4. Dressing Applied: Prisma Ag 5. Secondary Dressing Applied ABD Pad Non-Adherent pad 7. Secured with Tape 4-Layer Compression System - Left Lower Extremity Notes xtrasorb, charcoal, unna to anchor Electronic Signature(s) Signed: 12/21/2017 4:17:47 PM By: [name] Entered By: [name] on 12/21/2017 09:13:47 Quigley, Disney R. ([id number]) -------------------------------------------------------------------------------- Vitals Details Patient Name: Jessica Casey R. Date of Service: 12/21/2017 8:45 AM Medical Record Number: [id number] Patient Account Number: [id number] Date of Birth/Sex: 15-May-1981 (37 y.o. Female) Treating RN: Ashok Cordia, Debi Primary Care Keyry Iracheta: PATIENT, NO Other Clinician: Referring Bassem Bernasconi: Referral, Self Treating Mehdi Gironda/Extender: STONE III, HOYT Weeks in Treatment: 14 Vital Signs Time Taken: 09:06 Temperature (F): 97.8 Height (in): 74 Pulse (bpm): 89 Weight (lbs): 505 Respiratory Rate (breaths/min): 18 Body Mass Index (BMI): 64.8 Blood Pressure (mmHg): 140/74 Reference Range: 80 - 120 mg / dl Electronic Signature(s) Signed: 12/21/2017 4:17:47 PM By: [name] Entered By: [name] on 12/21/2017 09:07:22

## 2017-12-28 ENCOUNTER — Encounter: Payer: Self-pay | Admitting: Physician Assistant

## 2017-12-31 NOTE — Progress Notes (Signed)
ARLYNE, BRANDES ([id number]) Visit Report for 12/28/2017 Chief Complaint Document Details Patient Name: [name], [name] Date of Service: 12/28/2017 8:45 AM Medical Record Number: [id number] Patient Account Number: [id number] Date of Birth/Sex: 05-Oct-1981 (37 y.o. Female) Treating RN: Ashok Cordia, Debi Primary Care Provider: PATIENT, NO Other Clinician: Referring Provider: Referral, Self Treating Provider/Extender: STONE III, Neco Kling Weeks in Treatment: 15 Information Obtained from: Patient Chief Complaint She is here in follow up evaluation of right lower extremity ulcers Electronic Signature(s) Signed: 12/31/2017 9:06:28 AM By: Lenda Kelp PA-C Entered By: Lenda Kelp on 12/28/2017 09:13:49 [name], [name] ([id number]) -------------------------------------------------------------------------------- Debridement Details Patient Name: Jessica Casey R. Date of Service: 12/28/2017 8:45 AM Medical Record Number: [id number] Patient Account Number: [id number] Date of Birth/Sex: [birth year] (37 y.o. Female) Treating RN: Ashok Cordia, Debi Primary Care Provider: PATIENT, NO Other Clinician: Referring Provider: Referral, Self Treating Provider/Extender: STONE III, Kathlyn Leachman Weeks in Treatment: 15 Debridement Performed for Wound #2 Right,Distal,Lateral Lower Leg Assessment: Performed By: Physician STONE III, Jet Traynham E., PA-C Debridement: Debridement Pre-procedure Verification/Time Yes - 09:28 Out Taken: Start Time: 09:29 Pain Control: Lidocaine 4% Topical Solution Level: Skin/Subcutaneous Tissue Total Area Debrided (L x W): 1.6 (cm) x 1.2 (cm) = 1.92 (cm) Tissue and other material Viable, Non-Viable, Exudate, Fibrin/Slough, Subcutaneous debrided: Instrument: Curette Bleeding: Minimum Hemostasis Achieved: Pressure End Time: 09:31 Procedural Pain: 0 Post Procedural Pain: 0 Response to Treatment: Procedure was tolerated well Post Debridement Measurements of Total Wound Length: (cm) 1.6 Width: (cm)  1.2 Depth: (cm) 0.2 Volume: (cm) 0.302 Character of Wound/Ulcer Post Debridement: Requires Further Debridement Post Procedure Diagnosis Same as Pre-procedure Electronic Signature(s) Signed: 12/28/2017 4:52:03 PM By: [name] Signed: 12/31/2017 9:06:28 AM By: Lenda Kelp PA-C Entered By: [name] on 12/28/2017 09:31:50 [name], [name] ([id number]) -------------------------------------------------------------------------------- HPI Details Patient Name: Jessica Casey R. Date of Service: 12/28/2017 8:45 AM Medical Record Number: [id number] Patient Account Number: [id number] Date of Birth/Sex: 07-09-1981 (37 y.o. Female) Treating RN: Ashok Cordia, Debi Primary Care Provider: PATIENT, NO Other Clinician: Referring Provider: Referral, Self Treating Provider/Extender: STONE III, Mikeyla Music Weeks in Treatment: 15 History of Present Illness HPI Description: 37 year old patient well known to our [facility] wound care clinic where she has been seen since 2016 for bilateral lower extremity venous insufficiency disease with lymphedema and multiple ulcerations associated with morbid obesity. she had custom-made compression stockings and lymphedema pumps which were used in the past. most recently she was admitted to the hospital between October 11 and 09/02/2017 with sepsis, lower extremity wounds and lymphedema.she was initially treated in the outpatient with Keflex and Bactrim. she was initially treated in the hospital with vancomycin and Zosyn and changed over to Unasyn until her white count improved and her blood cultures were negative for 3 days. After her inpatient management she was discharged home on Augmentin to end on 09/13/2017 with a 14 day course. she has had outpatient vascular duplex scans completed in November 2017 and her right ABI was 1.1 and the left ABI is 1.3. she had normal toe brachial indices bilaterally.she had three-vessel runoff in the right lower extremity and  two-vessel runoff in the left lower extremity. On questioning the patient she does have custom made compression stockings and also has a lymphedema pump but has not been using it appropriately and has not been taking good care of herself. 09/17/2017 -- she returns today with compression stockings on the left side and the right side has had significant amount of drainage and has a very strong odor 09/24/2017 -- the  drainage is increased significantly and she has more lymphedema and a very strong odor to her wound. Though she does not have systemic symptoms, or overt infection I believe she will benefit from some doxycycline given empirically. 10/01/2017 -- after starting the doxycycline and changing the dressing twice a week her symptoms and signs have definitely improved overall. 10/08/2017 -- she has completed her course of doxycycline but continues to have a lot of drainage and needs twice a week dressing changes. 11/08/17-she is here in follow-up evaluation for right lower extremity ulcers. She admits to using her lymphedema pumps twice daily, one hour per session. she is voicing no complaints or concerns, no signs of infection will change to [facility] 12/14/17 on evaluation today patient appears to be doing very well in regard to her wounds. She has been tolerating the dressing changes she continues to develop some portly the adherent granular tissue on the surface of the wound with some Slough. Obviously we are trying to get too much better wound bed. With that being said the hyper granulation the Hydrofera Blue Dressing to have helped with which is excellent news. However I think it may be time to try something a little bit different at this point. 12/28/17 on evaluation today patient appears to be doing better in regard to her right lateral lower extremity ulcer. She has been tolerating the dressing changes without complication. Overall I'm pleased with the progress that she has made especially  after switching to the Prisma. I'm going to recommend that we continue with this most likely. With the slough noted on the surface she does require debridement today. Electronic Signature(s) Signed: 12/31/2017 9:06:28 AM By: Lenda Kelp PA-C Entered By: Lenda Kelp on 12/28/2017 09:38:22 [name] ([id number]) -------------------------------------------------------------------------------- Physical Exam Details Patient Name: Jessica Casey R. Date of Service: 12/28/2017 8:45 AM Medical Record Number: [id number] Patient Account Number: [id number] Date of Birth/Sex: December 29, 1980 (37 y.o. Female) Treating RN: Ashok Cordia, Debi Primary Care Provider: PATIENT, NO Other Clinician: Referring Provider: Referral, Self Treating Provider/Extender: STONE III, Deseri Loss Weeks in Treatment: 15 Constitutional Obese and well-hydrated in no acute distress. Respiratory normal breathing without difficulty. Psychiatric this patient is able to make decisions and demonstrates good insight into disease process. Alert and Oriented x 3. pleasant and cooperative. Notes There is no evidence of infection at this point. Patient is having no discomfort. She has been tolerating the dressing changes along with debridement without any difficulty whatsoever. Likewise she tolerated this today and post debridement the wound appear to be much better. Electronic Signature(s) Signed: 12/31/2017 9:06:28 AM By: Lenda Kelp PA-C Entered By: Lenda Kelp on 12/28/2017 09:40:00 [name] ([id number]) -------------------------------------------------------------------------------- Physician Orders Details Patient Name: Jessica Casey R. Date of Service: 12/28/2017 8:45 AM Medical Record Number: [id number] Patient Account Number: [id number] Date of Birth/Sex: Nov 11, 1981 (37 y.o. Female) Treating RN: Ashok Cordia, Debi Primary Care Provider: PATIENT, NO Other Clinician: Referring Provider: Referral, Self Treating  Provider/Extender: STONE III, Amarah Brossman Weeks in Treatment: 15 Verbal / Phone Orders: No Diagnosis Coding ICD-10 Coding Code Description [phone] 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d I89.0 Lymphedema, not elsewhere classified I87.311 Chronic venous hypertension (idiopathic) with ulcer of right lower extremity E66.01 Morbid (severe) obesity due to excess calories Wound Cleansing Wound #2 Right,Distal,Lateral Lower Leg o Clean wound with Normal Saline. o Cleanse wound with mild soap and water Anesthetic (add to Medication List) Wound #2 Right,Distal,Lateral Lower Leg o Topical Lidocaine 4% cream applied to wound bed prior  to debridement (In Clinic Only). Skin Barriers/Peri-Wound Care Wound #2 Right,Distal,Lateral Lower Leg o Barrier cream o Moisturizing lotion Primary Wound Dressing Wound #2 Right,Distal,Lateral Lower Leg o Prisma Ag Secondary Dressing Wound #2 Right,Distal,Lateral Lower Leg o ABD pad o Non-adherent pad o XtraSorb o Other - charcoal Dressing Change Frequency Wound #2 Right,Distal,Lateral Lower Leg o Change dressing every week Follow-up Appointments o Return Appointment in 1 week. o Nurse Visit as needed Edema Control [name], [name] ([id number]) Wound #2 Right,Distal,Lateral Lower Leg o 4-Layer Compression System - Right Lower Extremity - unna to anchor o Elevate legs to the level of the heart and pump ankles as often as possible Additional Orders / Instructions Wound #2 Right,Distal,Lateral Lower Leg o Vitamin A; Vitamin C, Zinc o Increase protein intake. o Other: - Please watch your salt (sodium) intake Patient Medications Allergies: No Known Allergies Notifications Medication Indication Start End lidocaine DOSE 1 - topical 4 % cream - 1 cream topical Electronic Signature(s) Signed: 12/28/2017 4:52:03 PM By: [name] Signed: 12/31/2017 9:06:28 AM By: Lenda Kelp PA-C Entered By:  [name] on 12/28/2017 09:29:05 [name] ([id number]) -------------------------------------------------------------------------------- Prescription 12/28/2017 Patient Name: Jessica Casey R. Provider: Lenda Kelp PA-C Date of Birth: [birth year] NPI#: [id number] Sex: F DEA#: [id number] Phone #: [phone] License #: Patient Address: [facility] [address] [facility] [place], [place] [address] [address] [place], [place] [address] [phone] Allergies No Known Allergies Medication Medication: Route: Strength: Form: lidocaine topical 4% cream Class: TOPICAL LOCAL ANESTHETICS Dose: Frequency / Time: Indication: 1 1 cream topical Number of Refills: Number of Units: 0 Generic Substitution: Start Date: End Date: Administered at Substitution Permitted Facility: Yes Time Administered: Time Discontinued: Note to Pharmacy: Signature(s): Date(s): Electronic Signature(s) Signed: 12/28/2017 4:52:03 PM By: [name] Signed: 12/31/2017 9:06:28 AM By: Lenda Kelp PA-C Entered By: [name] on 12/28/2017 09:29:06 Teasdale, Savannaha R. ([id number]) Lineman, Destyn R. ([id number]) --------------------------------------------------------------------------------  Problem List Details Patient Name: Jessica Casey R. Date of Service: 12/28/2017 8:45 AM Medical Record Number: [id number] Patient Account Number: [id number] Date of Birth/Sex: [birth year] (37 y.o. Female) Treating RN: Ashok Cordia, Debi Primary Care Provider: PATIENT, NO Other Clinician: Referring Provider: Referral, Self Treating Provider/Extender: STONE III, Carrington Olazabal Weeks in Treatment: 15 Active Problems ICD-10 Encounter Code Description Active Date Diagnosis L97.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d 09/10/2017 Yes I89.0 Lymphedema, not elsewhere classified 09/10/2017 Yes I87.311 Chronic venous  hypertension (idiopathic) with ulcer of right lower 09/10/2017 Yes extremity E66.01 Morbid (severe) obesity due to excess calories 09/10/2017 Yes Inactive Problems Resolved Problems Electronic Signature(s) Signed: 12/31/2017 9:06:28 AM By: Lenda Kelp PA-C Entered By: Lenda Kelp on 12/28/2017 09:13:28 Gamero, Vlada R. ([id number]) -------------------------------------------------------------------------------- Progress Note Details Patient Name: Jessica Casey R. Date of Service: 12/28/2017 8:45 AM Medical Record Number: [id number] Patient Account Number: [id number] Date of Birth/Sex: 02-02-1981 (37 y.o. Female) Treating RN: Ashok Cordia, Debi Primary Care Provider: PATIENT, NO Other Clinician: Referring Provider: Referral, Self Treating Provider/Extender: STONE III, Natahsa Marian Weeks in Treatment: 15 Subjective Chief Complaint Information obtained from Patient She is here in follow up evaluation of right lower extremity ulcers History of Present Illness (HPI) 37 year old patient well known to our [facility] wound care clinic where she has been seen since 2016 for bilateral lower extremity venous insufficiency disease with lymphedema and multiple ulcerations associated with morbid obesity. she had custom-made compression stockings and lymphedema pumps which were used in the past. most recently  she was admitted to the hospital between October 11 and 09/02/2017 with sepsis, lower extremity wounds and lymphedema.she was initially treated in the outpatient with Keflex and Bactrim. she was initially treated in the hospital with vancomycin and Zosyn and changed over to Unasyn until her white count improved and her blood cultures were negative for 3 days. After her inpatient management she was discharged home on Augmentin to end on 09/13/2017 with a 14 day course. she has had outpatient vascular duplex scans completed in November 2017 and her right ABI was 1.1 and the left ABI is 1.3. she had normal  toe brachial indices bilaterally.she had three-vessel runoff in the right lower extremity and two-vessel runoff in the left lower extremity. On questioning the patient she does have custom made compression stockings and also has a lymphedema pump but has not been using it appropriately and has not been taking good care of herself. 09/17/2017 -- she returns today with compression stockings on the left side and the right side has had significant amount of drainage and has a very strong odor 09/24/2017 -- the drainage is increased significantly and she has more lymphedema and a very strong odor to her wound. Though she does not have systemic symptoms, or overt infection I believe she will benefit from some doxycycline given empirically. 10/01/2017 -- after starting the doxycycline and changing the dressing twice a week her symptoms and signs have definitely improved overall. 10/08/2017 -- she has completed her course of doxycycline but continues to have a lot of drainage and needs twice a week dressing changes. 11/08/17-she is here in follow-up evaluation for right lower extremity ulcers. She admits to using her lymphedema pumps twice daily, one hour per session. she is voicing no complaints or concerns, no signs of infection will change to [facility] 12/14/17 on evaluation today patient appears to be doing very well in regard to her wounds. She has been tolerating the dressing changes she continues to develop some portly the adherent granular tissue on the surface of the wound with some Slough. Obviously we are trying to get too much better wound bed. With that being said the hyper granulation the Hydrofera Blue Dressing to have helped with which is excellent news. However I think it may be time to try something a little bit different at this point. 12/28/17 on evaluation today patient appears to be doing better in regard to her right lateral lower extremity ulcer. She has been tolerating the dressing  changes without complication. Overall I'm pleased with the progress that she has made especially after switching to the Prisma. I'm going to recommend that we continue with this most likely. With the slough noted on the surface she does require debridement today. [name], [name] ([id number]) Patient History Information obtained from Patient. Family History Kidney Disease - Mother, No family history of Cancer, Diabetes, Heart Disease, Hereditary Spherocytosis, Hypertension, Lung Disease, Seizures, Stroke, Thyroid Problems, Tuberculosis. Social History Never smoker, Marital Status - Married, Alcohol Use - Never, Drug Use - No History, Caffeine Use - Daily. Review of Systems (ROS) Constitutional Symptoms (General Health) Denies complaints or symptoms of Fever, Chills. Respiratory The patient has no complaints or symptoms. Cardiovascular The patient has no complaints or symptoms. Psychiatric The patient has no complaints or symptoms. Objective Constitutional Obese and well-hydrated in no acute distress. Vitals Time Taken: 9:04 AM, Height: 74 in, Weight: 505 lbs, BMI: 64.8, Temperature: 98.2 F, Pulse: 82 bpm, Respiratory Rate: 18 breaths/min, Blood Pressure: 125/84 mmHg. Respiratory normal breathing without  difficulty. Psychiatric this patient is able to make decisions and demonstrates good insight into disease process. Alert and Oriented x 3. pleasant and cooperative. General Notes: There is no evidence of infection at this point. Patient is having no discomfort. She has been tolerating the dressing changes along with debridement without any difficulty whatsoever. Likewise she tolerated this today and post debridement the wound appear to be much better. Integumentary (Hair, Skin) Wound #2 status is Open. Original cause of wound was Gradually Appeared. The wound is located on the Right,Distal,Lateral Lower Leg. The wound measures 1.6cm length x 1.2cm width x 0.1cm depth; 1.508cm^2  area and 0.151cm^3 volume. There is Fat Layer (Subcutaneous Tissue) Exposed exposed. There is no tunneling or undermining noted. There is a large amount of serosanguineous drainage noted. The wound margin is flat and intact. There is large (67-100%) red granulation within the wound bed. There is a small (1-33%) amount of necrotic tissue within the wound bed including Adherent Slough. The periwound skin appearance had no abnormalities noted for color. The periwound skin appearance exhibited: Scarring. The periwound skin appearance did not exhibit: Callus, Crepitus, Excoriation, Induration, Rash, Dry/Scaly, Maceration. Periwound [name], [name] ([id number]) temperature was noted as No Abnormality. The periwound has tenderness on palpation. Assessment Active Problems ICD-10 L97.212 - 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d I89.0 - Lymphedema, not elsewhere classified I87.311 - Chronic venous hypertension (idiopathic) with ulcer of right lower extremity E66.01 - Morbid (severe) obesity due to excess calories Procedures Wound #2 Pre-procedure diagnosis of Wound #2 is a Lymphedema located on the Right,Distal,Lateral Lower Leg . There was a Skin/Subcutaneous Tissue Debridement ([id number]) debridement with total area of 1.92 sq cm performed by STONE III, Ahnna Dungan E., PA-C. with the following instrument(s): Curette to remove Viable and Non-Viable tissue/material including Exudate, Fibrin/Slough, and Subcutaneous after achieving pain control using Lidocaine 4% Topical Solution. A time out was conducted at 09:28, prior to the start of the procedure. A Minimum amount of bleeding was controlled with Pressure. The procedure was tolerated well with a pain level of 0 throughout and a pain level of 0 following the procedure. Post Debridement Measurements: 1.6cm length x 1.2cm width x 0.2cm depth; 0.302cm^3 volume. Character of Wound/Ulcer Post Debridement requires further  debridement. Post procedure Diagnosis Wound #2: Same as Pre-Procedure Plan Wound Cleansing: Wound #2 Right,Distal,Lateral Lower Leg: Clean wound with Normal Saline. Cleanse wound with mild soap and water Anesthetic (add to Medication List): Wound #2 Right,Distal,Lateral Lower Leg: Topical Lidocaine 4% cream applied to wound bed prior to debridement (In Clinic Only). Skin Barriers/Peri-Wound Care: Wound #2 Right,Distal,Lateral Lower Leg: Barrier cream Moisturizing lotion Primary Wound Dressing: Wound #2 Right,Distal,Lateral Lower Leg: Prisma Ag Secondary Dressing: Wound #2 Right,Distal,Lateral Lower Leg: ABD pad Non-adherent pad [name], [name] ([id number]) XtraSorb Other - charcoal Dressing Change Frequency: Wound #2 Right,Distal,Lateral Lower Leg: Change dressing every week Follow-up Appointments: Return Appointment in 1 week. Nurse Visit as needed Edema Control: Wound #2 Right,Distal,Lateral Lower Leg: 4-Layer Compression System - Right Lower Extremity - unna to anchor Elevate legs to the level of the heart and pump ankles as often as possible Additional Orders / Instructions: Wound #2 Right,Distal,Lateral Lower Leg: Vitamin A; Vitamin C, Zinc Increase protein intake. Other: - Please watch your salt (sodium) intake The following medication(s) was prescribed: lidocaine topical 4 % cream 1 1 cream topical was prescribed at facility I am going to recommend that we continue with the Current wound care measures for the next week. She's in  agreement with the plan. We will see were things stand following. Please see above for specific wound care orders. We will see patient for re-evaluation in 1 week(s) here in the clinic. If anything worsens or changes patient will contact our office for additional recommendations. Electronic Signature(s) Signed: 12/31/2017 9:06:28 AM By: Lenda KelpStone III, Baraka Klatt PA-C Entered By: Lenda KelpStone III, Catia Todorov on 12/28/2017 09:40:39 [name], [name]  ([id number]) -------------------------------------------------------------------------------- ROS/PFSH Details Patient Name: Jessica CaseyLEATH, Rhian R. Date of Service: 12/28/2017 8:45 AM Medical Record Number: [id number] Patient Account Number: [id number] Date of Birth/Sex: 07/08/1981 (37 y.o. Female) Treating RN: Ashok CordiaPinkerton, Debi Primary Care Provider: PATIENT, NO Other Clinician: Referring Provider: Referral, Self Treating Provider/Extender: STONE III, Milena Liggett Weeks in Treatment: 15 Information Obtained From Patient Wound History Do you currently have one or more open woundso Yes How many open wounds do you currently haveo 1 Approximately how long have you had your woundso 1 month How have you been treating your wound(s) until nowo aquacel ag Has your wound(s) ever healed and then re-openedo No Have you had any lab work done in the past montho No Have you tested positive for an antibiotic resistant organism (MRSA, VRE)o No Have you tested positive for osteomyelitis (bone infection)o No Have you had any tests for circulation on your legso Yes Who ordered the testo [facility] Where was the test doneo [facility] Constitutional Symptoms (General Health) Complaints and Symptoms: Negative for: Fever; Chills Hematologic/Lymphatic Medical History: Positive for: Lymphedema Respiratory Complaints and Symptoms: No Complaints or Symptoms Medical History: Negative for: Aspiration; Asthma; Chronic Obstructive Pulmonary Disease (COPD); Pneumothorax; Sleep Apnea; Tuberculosis Cardiovascular Complaints and Symptoms: No Complaints or Symptoms Medical History: Positive for: Hypertension Negative for: Angina; Arrhythmia; Congestive Heart Failure; Coronary Artery Disease; Deep Vein Thrombosis; Hypotension; Myocardial Infarction; Peripheral Arterial Disease; Peripheral Venous Disease; Phlebitis; Vasculitis Psychiatric Complaints and Symptoms: No Complaints or Symptoms Babilonia, Shareta R.  ([id number]) Immunizations Pneumococcal Vaccine: Received Pneumococcal Vaccination: No Immunization Notes: up to date Implantable Devices Family and Social History Cancer: No; Diabetes: No; Heart Disease: No; Hereditary Spherocytosis: No; Hypertension: No; Kidney Disease: Yes - Mother; Lung Disease: No; Seizures: No; Stroke: No; Thyroid Problems: No; Tuberculosis: No; Never smoker; Marital Status - Married; Alcohol Use: Never; Drug Use: No History; Caffeine Use: Daily; Financial Concerns: No; Food, Clothing or Shelter Needs: No; Support System Lacking: No; Transportation Concerns: No; Advanced Directives: No; Patient does not want information on Advanced Directives Physician Affirmation I have reviewed and agree with the above information. Electronic Signature(s) Signed: 12/28/2017 4:52:03 PM By: Alejandro MullingPinkerton, Debra Signed: 12/31/2017 9:06:28 AM By: Lenda KelpStone III, Makana Feigel PA-C Entered By: Lenda KelpStone III, Florence Yeung on 12/28/2017 09:39:21 Zeleznik, Brittnee Marland [name]. ([id number]) -------------------------------------------------------------------------------- SuperBill Details Patient Name: Jessica CaseyLEATH, Brie R. Date of Service: 12/28/2017 Medical Record Number: [id number] Patient Account Number: [id number] Date of Birth/Sex: 07/08/1981 (37 y.o. 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 I89.0 Lymphedema, not elsewhere classified I87.311 Chronic venous hypertension (idiopathic) with ulcer of right lower extremity E66.01 Morbid (severe) obesity due to excess calories Facility Procedures CPT4 Code: [id number] Description: 11042 - DEB SUBQ TISSUE 20 SQ CM/< ICD-10 Diagnosis Description L97.212 Non-pressure chronic ulcer of right calf with fat layer expo Modifier:  sed Quantity: 1 Physician Procedures CPT4 Code: [id number] Description: 11042 - WC PHYS SUBQ TISS 20 SQ CM ICD-10 Diagnosis Description L97.212 Non-pressure chronic  ulcer of right calf with fat layer expo Modifier: sed Quantity: 1 Electronic Signature(s) Signed: 12/31/2017 9:06:28 AM By: Lenda Kelp PA-C Entered By: Lenda Kelp on 12/28/2017 09:40:50

## 2017-12-31 NOTE — Progress Notes (Signed)
SHANDREA, LUSK ([id number]) Visit Report for 12/28/2017 Arrival Information Details Patient Name: [name], [name] Date of Service: 12/28/2017 8:45 AM Medical Record Number: [id number] Patient Account Number: [id number] Date of Birth/Sex: [birth year] (37 y.o. Female) Treating RN: Ashok Cordia, Debi Primary Care Perley Arthurs: PATIENT, NO Other Clinician: Referring Ayame Rena: Referral, Self Treating Itzamara Casas/Extender: STONE III, HOYT Weeks in Treatment: 15 Visit Information History Since Last Visit All ordered tests and consults were completed: No Patient Arrived: Ambulatory Added or deleted any medications: No Arrival Time: 09:03 Any new allergies or adverse reactions: No Accompanied By: self Had a fall or experienced change in No Transfer Assistance: None activities of daily living that may affect Patient Identification Verified: Yes risk of falls: Secondary Verification Process Completed: Yes Signs or symptoms of abuse/neglect since last visito No Patient Requires Transmission-Based No Hospitalized since last visit: No Precautions: Has Dressing in Place as Prescribed: Yes Patient Has Alerts: No Has Compression in Place as Prescribed: Yes Pain Present Now: No Electronic Signature(s) Signed: 12/28/2017 4:52:03 PM By: [name] Entered By: [name] on 12/28/2017 09:04:02 [name] ([id number]) -------------------------------------------------------------------------------- Encounter Discharge Information Details Patient Name: Jessica Casey R. Date of Service: 12/28/2017 8:45 AM Medical Record Number: [id number] Patient Account Number: [id number] Date of Birth/Sex: Apr 23, 1981 (37 y.o. Female) Treating RN: Ashok Cordia, Debi Primary Care Donivan Thammavong: PATIENT, NO Other Clinician: Referring Teegan Guinther: Referral, Self Treating Krissi Willaims/Extender: STONE III, HOYT Weeks in Treatment: 15 Encounter Discharge Information Items Discharge Pain Level: 0 Discharge Condition: Stable Ambulatory  Status: Ambulatory Discharge Destination: Home Private Transportation: Auto Accompanied By: self Schedule Follow-up Appointment: Yes Medication Reconciliation completed and provided No to Patient/Care Kassandra Meriweather: Clinical Summary of Care: Provided Form Type Recipient Paper Patient EL Electronic Signature(s) Signed: 12/28/2017 9:49:37 AM By: Dayton Martes RCP, RRT, CHT Entered By: Dayton Martes on 12/28/2017 09:49:37 Hinely, [name] ([id number]) -------------------------------------------------------------------------------- Lower Extremity Assessment Details Patient Name: Jessica Casey R. Date of Service: 12/28/2017 8:45 AM Medical Record Number: [id number] Patient Account Number: [id number] Date of Birth/Sex: [birth year] (37 y.o. Female) Treating RN: Ashok Cordia, Debi Primary Care Zonnie Landen: PATIENT, NO Other Clinician: Referring Haeli Gerlich: Referral, Self Treating Delcia Spitzley/Extender: STONE III, HOYT Weeks in Treatment: 15 Edema Assessment Assessed: [Left: No] [Right: No] [Left: Edema] [Right: :] Calf Left: Right: Point of Measurement: 35 cm From Medial Instep cm 57 cm Ankle Left: Right: Point of Measurement: 9 cm From Medial Instep cm 36.1 cm Vascular Assessment Pulses: Dorsalis Pedis Palpable: [Right:Yes] Posterior Tibial Extremity colors, hair growth, and conditions: Extremity Color: [Right:Hyperpigmented] Temperature of Extremity: [Right:Warm] Capillary Refill: [Right:< 3 seconds] Toe Nail Assessment Left: Right: Thick: Yes Discolored: Yes Deformed: Yes Improper Length and Hygiene: Yes Electronic Signature(s) Signed: 12/28/2017 4:52:03 PM By: [name] Entered By: [name] on 12/28/2017 09:16:19 Delosreyes, [name] ([id number]) -------------------------------------------------------------------------------- Multi Wound Chart Details Patient Name: Jessica Casey R. Date of Service: 12/28/2017 8:45 AM Medical Record Number:  [id number] Patient Account Number: [id number] Date of Birth/Sex: 1981/03/31 (37 y.o. Female) Treating RN: Ashok Cordia, Debi Primary Care Chantz Montefusco: PATIENT, NO Other Clinician: Referring Marcelle Bebout: Referral, Self Treating Jesika Men/Extender: STONE III, HOYT Weeks in Treatment: 15 Vital Signs Height(in): 74 Pulse(bpm): 82 Weight(lbs): 505 Blood Pressure(mmHg): 125/84 Body Mass Index(BMI): 65 Temperature(F): 98.2 Respiratory Rate 18 (breaths/min): Photos: [2:No Photos] [N/A:N/A] Wound Location: [2:Right Lower Leg - Lateral, Distal] [N/A:N/A] Wounding Event: [2:Gradually Appeared] [N/A:N/A] Primary Etiology: [2:Lymphedema] [N/A:N/A] Comorbid History: [2:Lymphedema, Hypertension] [N/A:N/A] Date Acquired: [2:08/08/2017] [N/A:N/A] Weeks of Treatment: [2:15] [N/A:N/A] Wound Status: [2:Open] [N/A:N/A] Clustered Wound: [2:Yes] [N/A:N/A] Clustered Quantity: [2:2] [N/A:N/A] Measurements L  x W x D [2:1.6x1.2x0.1] [N/A:N/A] (cm) Area (cm) : [2:1.508] [N/A:N/A] Volume (cm) : [2:0.151] [N/A:N/A] % Reduction in Area: [2:93.00%] [N/A:N/A] % Reduction in Volume: [2:97.70%] [N/A:N/A] Classification: [2:Full Thickness With Exposed Support Structures] [N/A:N/A] Exudate Amount: [2:Large] [N/A:N/A] Exudate Type: [2:Serosanguineous] [N/A:N/A] Exudate Color: [2:red, brown] [N/A:N/A] Wound Margin: [2:Flat and Intact] [N/A:N/A] Granulation Amount: [2:Large (67-100%)] [N/A:N/A] Granulation Quality: [2:Red] [N/A:N/A] Necrotic Amount: [2:Small (1-33%)] [N/A:N/A] Exposed Structures: [2:Fat Layer (Subcutaneous Tissue) Exposed: Yes Fascia: No Tendon: No Muscle: No Joint: No Bone: No] [N/A:N/A] Epithelialization: [2:Small (1-33%)] [N/A:N/A] Periwound Skin Texture: [2:Scarring: Yes Excoriation: No Induration: No] [N/A:N/A] Callus: No Crepitus: No Rash: No Periwound Skin Moisture: Maceration: No N/A N/A Dry/Scaly: No Periwound Skin Color: Atrophie Blanche: No N/A N/A Cyanosis: No Ecchymosis:  No Erythema: No Hemosiderin Staining: No Mottled: No Pallor: No Rubor: No Temperature: No Abnormality N/A N/A Tenderness on Palpation: Yes N/A N/A Wound Preparation: Ulcer Cleansing: N/A N/A Rinsed/Irrigated with Saline, Other: soap and water Topical Anesthetic Applied: Other: lidocaine 4% Treatment Notes Electronic Signature(s) Signed: 12/28/2017 4:52:03 PM By: Alejandro MullingPinkerton, Debra Entered By: Alejandro MullingPinkerton, Debra on 12/28/2017 16:10:9609:28:25 Marlaine HindLEATH, [name] ([id number]) -------------------------------------------------------------------------------- Multi-Disciplinary Care Plan Details Patient Name: Jessica CaseyLEATH, [name] Date of Service: 12/28/2017 8:45 AM Medical Record Number: [id number] Patient Account Number: [id number] Date of Birth/Sex: 06-17-1981 ([age] y.o. Female) Treating RN: Ashok CordiaPinkerton, Debi Primary Care Kenry Daubert: PATIENT, NO Other Clinician: Referring Addaleigh Nicholls: Referral, Self Treating Marria Mathison/Extender: STONE III, HOYT Weeks in Treatment: 15 Active Inactive ` Orientation to the Wound Care Program Nursing Diagnoses: Knowledge deficit related to the wound healing center program Goals: Patient/caregiver will verbalize understanding of the Wound Healing Center Program Date Initiated: 09/10/2017 Target Resolution Date: 11/23/2017 Goal Status: Active Interventions: Provide education on orientation to the wound center Notes: ` Venous Leg Ulcer Nursing Diagnoses: Knowledge deficit related to disease process and management Goals: Patient/caregiver will verbalize understanding of disease process and disease management Date Initiated: 09/10/2017 Target Resolution Date: 11/23/2017 Goal Status: Active Interventions: Assess peripheral edema status every visit. Notes: ` Wound/Skin Impairment Nursing Diagnoses: Impaired tissue integrity Goals: Ulcer/skin breakdown will heal within 14 weeks Date Initiated: 09/10/2017 Target Resolution Date: 11/24/2017 Goal Status:  Active Interventions: Marlaine HindLEATH, Jacquelina R. ([id number]) Assess patient/caregiver ability to obtain necessary supplies Assess patient/caregiver ability to perform ulcer/skin care regimen upon admission and as needed Assess ulceration(s) every visit Notes: Electronic Signature(s) Signed: 12/28/2017 4:52:03 PM By: Alejandro MullingPinkerton, Debra Entered By: Alejandro MullingPinkerton, Debra on 12/28/2017 09:28:18 Clinch, Tifanny R. ([id number]) -------------------------------------------------------------------------------- Pain Assessment Details Patient Name: Jessica CaseyLEATH, [name] Date of Service: 12/28/2017 8:45 AM Medical Record Number: [id number] Patient Account Number: [id number] Date of Birth/Sex: 06-17-1981 (37 y.o. Female) Treating RN: Ashok CordiaPinkerton, Debi Primary Care Mahin Guardia: PATIENT, NO Other Clinician: Referring Nadezhda Pollitt: Referral, Self Treating Ary Rudnick/Extender: STONE III, HOYT Weeks in Treatment: 15 Active Problems Location of Pain Severity and Description of Pain Patient Has Paino No Site Locations Pain Management and Medication Current Pain Management: Electronic Signature(s) Signed: 12/28/2017 4:52:03 PM By: Alejandro MullingPinkerton, Debra Entered By: Alejandro MullingPinkerton, Debra on 12/28/2017 09:04:07 Marlaine HindLEATH, [name] ([id number]) -------------------------------------------------------------------------------- Patient/Caregiver Education Details Patient Name: Jessica CaseyLEATH, [name] Date of Service: 12/28/2017 8:45 AM Medical Record Number: [id number] Patient Account Number: [id number] Date of Birth/Gender: 06-17-1981 (37 y.o. Female) Treating RN: Ashok CordiaPinkerton, Debi Primary Care Physician: PATIENT, NO Other Clinician: Referring Physician: Referral, Self Treating Physician/Extender: STONE III, HOYT Weeks in Treatment: 15 Education Assessment Education Provided To: Patient Education Topics Provided Wound/Skin Impairment: Handouts: Other: change dressing as ordered Methods: Demonstration, Explain/Verbal Responses: State content correctly Electronic  Signature(s) Signed: 12/28/2017  4:52:03 PM By: [name] Entered By: [name] on 12/28/2017 09:45:42 Theys, Jessica RMarland Kitchen ([id number]) -------------------------------------------------------------------------------- Wound Assessment Details Patient Name: Jessica Casey R. Date of Service: 12/28/2017 8:45 AM Medical Record Number: [id number] Patient Account Number: [id number] Date of Birth/Sex: 12/19/1980 (37 y.o. Female) Treating RN: Ashok Cordia, Debi Primary Care Adesuwa Osgood: PATIENT, NO Other Clinician: Referring Ingrid Shifrin: Referral, Self Treating Irven Ingalsbe/Extender: STONE III, HOYT Weeks in Treatment: 15 Wound Status Wound Number: 2 Primary Etiology: Lymphedema Wound Location: Right Lower Leg - Lateral, Distal Wound Status: Open Wounding Event: Gradually Appeared Comorbid History: Lymphedema, Hypertension Date Acquired: 08/08/2017 Weeks Of Treatment: 15 Clustered Wound: Yes Photos Photo Uploaded By: [name] on 12/28/2017 16:51:18 Wound Measurements Length: (cm) 1.6 Width: (cm) 1.2 Depth: (cm) 0.1 Clustered Quantity: 2 Area: (cm) 1.508 Volume: (cm) 0.151 % Reduction in Area: 93% % Reduction in Volume: 97.7% Epithelialization: Small (1-33%) Tunneling: No Undermining: No Wound Description Full Thickness With Exposed Support Classification: Structures Wound Margin: Flat and Intact Exudate Large Amount: Exudate Type: Serosanguineous Exudate Color: red, brown Foul Odor After Cleansing: No Slough/Fibrino Yes Wound Bed Granulation Amount: Large (67-100%) Exposed Structure Granulation Quality: Red Fascia Exposed: No Necrotic Amount: Small (1-33%) Fat Layer (Subcutaneous Tissue) Exposed: Yes Necrotic Quality: Adherent Slough Tendon Exposed: No Muscle Exposed: No Joint Exposed: No Rittenhouse, [name] ([id number]) Bone Exposed: No Periwound Skin Texture Texture Color No Abnormalities Noted: No No Abnormalities Noted: Yes Callus: No Temperature /  Pain Crepitus: No Temperature: No Abnormality Excoriation: No Tenderness on Palpation: Yes Induration: No Rash: No Scarring: Yes Moisture No Abnormalities Noted: No Dry / Scaly: No Maceration: No Wound Preparation Ulcer Cleansing: Rinsed/Irrigated with Saline, Other: soap and water, Topical Anesthetic Applied: Other: lidocaine 4%, Treatment Notes Wound #2 (Right, Distal, Lateral Lower Leg) 1. Cleansed with: Clean wound with Normal Saline Cleanse wound with antibacterial soap and water 2. Anesthetic Topical Lidocaine 4% cream to wound bed prior to debridement 3. Peri-wound Care: Barrier cream Moisturizing lotion 4. Dressing Applied: Prisma Ag 5. Secondary Dressing Applied ABD Pad Dry Gauze 7. Secured with Tape 4-Layer Compression System - Right Lower Extremity Notes xtrasorb, charcoal, unna to anchor Electronic Signature(s) Signed: 12/28/2017 4:52:03 PM By: [name] Entered By: [name] on 12/28/2017 09:15:24 [name] ([id number]) -------------------------------------------------------------------------------- Vitals Details Patient Name: Jessica Casey R. Date of Service: 12/28/2017 8:45 AM Medical Record Number: [id number] Patient Account Number: [id number] Date of Birth/Sex: 1981/10/15 (37 y.o. Female) Treating RN: Ashok Cordia, Debi Primary Care Taeja Debellis: PATIENT, NO Other Clinician: Referring Nezar Buckles: Referral, Self Treating Damyia Strider/Extender: STONE III, HOYT Weeks in Treatment: 15 Vital Signs Time Taken: 09:04 Temperature (F): 98.2 Height (in): 74 Pulse (bpm): 82 Weight (lbs): 505 Respiratory Rate (breaths/min): 18 Body Mass Index (BMI): 64.8 Blood Pressure (mmHg): 125/84 Reference Range: 80 - 120 mg / dl Electronic Signature(s) Signed: 12/28/2017 4:52:03 PM By: [name] Entered By: [name] on 12/28/2017 09:05:51

## 2018-01-04 ENCOUNTER — Encounter: Payer: Self-pay | Admitting: Physician Assistant

## 2018-01-07 NOTE — Progress Notes (Signed)
JEHIELI, BRASSELL ([id number]) Visit Report for 01/04/2018 Chief Complaint Document Details Patient Name: [name], [name] Date of Service: 01/04/2018 8:45 AM Medical Record Number: [id number] Patient Account Number: [id number] Date of Birth/Sex: 07/13/1981 (37 y.o. Female) Treating RN: Ashok Cordia, Debi Primary Care Provider: PATIENT, NO Other Clinician: Referring Provider: Referral, Self Treating Provider/Extender: STONE III,  Weeks in Treatment: 16 Information Obtained from: Patient Chief Complaint She is here in follow up evaluation of right lower extremity ulcers Electronic Signature(s) Signed: 01/07/2018 8:04:39 AM By: Lenda Kelp PA-C Entered By: Lenda Kelp on 01/04/2018 09:21:56 Zuidema, Laelynn R. ([id number]) -------------------------------------------------------------------------------- Debridement Details Patient Name: Jessica Casey R. Date of Service: 01/04/2018 8:45 AM Medical Record Number: [id number] Patient Account Number: [id number] Date of Birth/Sex: 06/29/1981 (38 y.o. Female) Treating RN: Ashok Cordia, Debi Primary Care Provider: PATIENT, NO Other Clinician: Referring Provider: Referral, Self Treating Provider/Extender: STONE III,  Weeks in Treatment: 16 Debridement Performed for Wound #2 Right,Distal,Lateral Lower Leg Assessment: Performed By: Physician STONE III,  E., PA-C Debridement: Debridement Pre-procedure Verification/Time Yes - 09:22 Out Taken: Start Time: 09:23 Pain Control: Lidocaine 4% Topical Solution Level: Skin/Subcutaneous Tissue Total Area Debrided (L x W): 1.3 (cm) x 1 (cm) = 1.3 (cm) Tissue and other material Viable, Exudate, Fibrin/Slough, Subcutaneous debrided: Instrument: Curette Bleeding: Minimum Hemostasis Achieved: Pressure End Time: 09:25 Procedural Pain: 0 Post Procedural Pain: 0 Response to Treatment: Procedure was tolerated well Post Debridement Measurements of Total Wound Length: (cm) 1.3 Width: (cm) 1 Depth:  (cm) 0.2 Volume: (cm) 0.204 Character of Wound/Ulcer Post Debridement: Requires Further Debridement Post Procedure Diagnosis Same as Pre-procedure Electronic Signature(s) Signed: 01/04/2018 4:55:35 PM By: [name] Signed: 01/07/2018 8:04:39 AM By: Lenda Kelp PA-C Entered By: [name] on 01/04/2018 09:26:03 Deery, Sidney R. ([id number]) -------------------------------------------------------------------------------- HPI Details Patient Name: Jessica Casey R. Date of Service: 01/04/2018 8:45 AM Medical Record Number: [id number] Patient Account Number: [id number] Date of Birth/Sex: 1981/08/03 (37 y.o. Female) Treating RN: Ashok Cordia, Debi Primary Care Provider: PATIENT, NO Other Clinician: Referring Provider: Referral, Self Treating Provider/Extender: STONE III,  Weeks in Treatment: 16 History of Present Illness HPI Description: 37 year old patient well known to our [facility] wound care clinic where she has been seen since 2016 for bilateral lower extremity venous insufficiency disease with lymphedema and multiple ulcerations associated with morbid obesity. she had custom-made compression stockings and lymphedema pumps which were used in the past. most recently she was admitted to the hospital between October 11 and 09/02/2017 with sepsis, lower extremity wounds and lymphedema.she was initially treated in the outpatient with Keflex and Bactrim. she was initially treated in the hospital with vancomycin and Zosyn and changed over to Unasyn until her white count improved and her blood cultures were negative for 3 days. After her inpatient management she was discharged home on Augmentin to end on 09/13/2017 with a 14 day course. she has had outpatient vascular duplex scans completed in November 2017 and her right ABI was 1.1 and the left ABI is 1.3. she had normal toe brachial indices bilaterally.she had three-vessel runoff in the right lower extremity and two-vessel runoff  in the left lower extremity. On questioning the patient she does have custom made compression stockings and also has a lymphedema pump but has not been using it appropriately and has not been taking good care of herself. 09/17/2017 -- she returns today with compression stockings on the left side and the right side has had significant amount of drainage and has a very strong odor 09/24/2017 -- the drainage  is increased significantly and she has more lymphedema and a very strong odor to her wound. Though she does not have systemic symptoms, or overt infection I believe she will benefit from some doxycycline given empirically. 10/01/2017 -- after starting the doxycycline and changing the dressing twice a week her symptoms and signs have definitely improved overall. 10/08/2017 -- she has completed her course of doxycycline but continues to have a lot of drainage and needs twice a week dressing changes. 11/08/17-she is here in follow-up evaluation for right lower extremity ulcers. She admits to using her lymphedema pumps twice daily, one hour per session. she is voicing no complaints or concerns, no signs of infection will change to [facility] 12/14/17 on evaluation today patient appears to be doing very well in regard to her wounds. She has been tolerating the dressing changes she continues to develop some portly the adherent granular tissue on the surface of the wound with some Slough. Obviously we are trying to get too much better wound bed. With that being said the hyper granulation the Hydrofera Blue Dressing to have helped with which is excellent news. However I think it may be time to try something a little bit different at this point. 01/04/18 on evaluation today patient appears to be doing fairly well in regard to her right lateral ulcers. Both appear to be smaller compared to last week's evaluation. She has been tolerating the wraps without complication she does have a little bit more swelling  today but again she has been using her lymphedema pumps she states she's been up on her legs a little bit more just been busy with life in general. She is having no pain. Electronic Signature(s) Signed: 01/07/2018 8:04:39 AM By: Lenda Kelp PA-C Entered By: Lenda Kelp on 01/04/2018 09:34:57 Lokey, Kevonna RMarland Kitchen ([id number]) -------------------------------------------------------------------------------- Physical Exam Details Patient Name: Jessica Casey R. Date of Service: 01/04/2018 8:45 AM Medical Record Number: [id number] Patient Account Number: [id number] Date of Birth/Sex: Jan 22, 1981 (37 y.o. Female) Treating RN: Ashok Cordia, Debi Primary Care Provider: PATIENT, NO Other Clinician: Referring Provider: Referral, Self Treating Provider/Extender: STONE III,  Weeks in Treatment: 16 Constitutional Obese and well-hydrated in no acute distress. Respiratory normal breathing without difficulty. Psychiatric this patient is able to make decisions and demonstrates good insight into disease process. Alert and Oriented x 3. pleasant and cooperative. Notes Patient's wound was slough covered on evaluation today did require sharp debridement which she tolerated without complication. Post debridement the wound appeared to be doing much better which is excellent news. Electronic Signature(s) Signed: 01/07/2018 8:04:39 AM By: Lenda Kelp PA-C Entered By: Lenda Kelp on 01/04/2018 09:36:28 [name] ([id number]) -------------------------------------------------------------------------------- Physician Orders Details Patient Name: Jessica Casey R. Date of Service: 01/04/2018 8:45 AM Medical Record Number: [id number] Patient Account Number: [id number] Date of Birth/Sex: [birth year] (37 y.o. Female) Treating RN: Ashok Cordia, Debi Primary Care Provider: PATIENT, NO Other Clinician: Referring Provider: Referral, Self Treating Provider/Extender: STONE III,  Weeks in Treatment: 16 Verbal /  Phone Orders: Yes Clinician: Pinkerton, Debi Read Back and Verified: Yes Diagnosis Coding ICD-10 Coding Code Description [phone] 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d I89.0 Lymphedema, not elsewhere classified I87.311 Chronic venous hypertension (idiopathic) with ulcer of right lower extremity E66.01 Morbid (severe) obesity due to excess calories Wound Cleansing Wound #2 Right,Distal,Lateral Lower Leg o Clean wound with Normal Saline. o Cleanse wound with mild soap and water Anesthetic (add to Medication List) Wound #2 Right,Distal,Lateral Lower Leg o Topical Lidocaine 4%  cream applied to wound bed prior to debridement (In Clinic Only). Skin Barriers/Peri-Wound Care Wound #2 Right,Distal,Lateral Lower Leg o Barrier cream o Moisturizing lotion Primary Wound Dressing Wound #2 Right,Distal,Lateral Lower Leg o Prisma Ag Secondary Dressing Wound #2 Right,Distal,Lateral Lower Leg o ABD pad o Non-adherent pad o XtraSorb o Other - charcoal Dressing Change Frequency Wound #2 Right,Distal,Lateral Lower Leg o Change dressing every week Follow-up Appointments o Return Appointment in 1 week. o Nurse Visit as needed Edema Control Karr, Berneice R. ([id number]) Wound #2 Right,Distal,Lateral Lower Leg o 4-Layer Compression System - Right Lower Extremity - unna to anchor o Elevate legs to the level of the heart and pump ankles as often as possible Additional Orders / Instructions Wound #2 Right,Distal,Lateral Lower Leg o Vitamin A; Vitamin C, Zinc o Increase protein intake. o Other: - Please watch your salt (sodium) intake Patient Medications Allergies: No Known Allergies Notifications Medication Indication Start End lidocaine DOSE 1 - topical 4 % cream - 1 cream topical Electronic Signature(s) Signed: 01/04/2018 4:55:35 PM By: [name] Signed: 01/07/2018 8:04:39 AM By: Lenda Kelp PA-C Entered By: [name] on 01/04/2018 09:26:59 Igou, Cristal Deer ([id number]) -------------------------------------------------------------------------------- Prescription 01/04/2018 Patient Name: Jessica Casey R. Provider: Lenda Kelp PA-C Date of Birth: [birth year] NPI#: [id number] Sex: F DEA#: [id number] Phone #: [phone] License #: Patient Address: [facility] [address] [facility] [place], [place] [address] [address] [place], [place] [address] [phone] Allergies No Known Allergies Medication Medication: Route: Strength: Form: lidocaine topical 4% cream Class: TOPICAL LOCAL ANESTHETICS Dose: Frequency / Time: Indication: 1 1 cream topical Number of Refills: Number of Units: 0 Generic Substitution: Start Date: End Date: Administered at Substitution Permitted Facility: Yes Time Administered: Time Discontinued: Note to Pharmacy: Signature(s): Date(s): Electronic Signature(s) Signed: 01/04/2018 4:55:35 PM By: [name] Signed: 01/07/2018 8:04:39 AM By: Lenda Kelp PA-C Entered By: [name] on 01/04/2018 09:26:59 Fait, Marcelle R. ([id number]) Karilyn Cota, Ame R. ([id number]) --------------------------------------------------------------------------------  Problem List Details Patient Name: Jessica Casey R. Date of Service: 01/04/2018 8:45 AM Medical Record Number: [id number] Patient Account Number: [id number] Date of Birth/Sex: 1981/06/26 (37 y.o. Female) Treating RN: Ashok Cordia, Debi Primary Care Provider: PATIENT, NO Other Clinician: Referring Provider: Referral, Self Treating Provider/Extender: STONE III,  Weeks in Treatment: 16 Active Problems ICD-10 Encounter Code Description Active Date Diagnosis L97.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d 09/10/2017 Yes I89.0 Lymphedema, not elsewhere classified 09/10/2017 Yes I87.311 Chronic venous hypertension  (idiopathic) with ulcer of right lower 09/10/2017 Yes extremity E66.01 Morbid (severe) obesity due to excess calories 09/10/2017 Yes Inactive Problems Resolved Problems Electronic Signature(s) Signed: 01/07/2018 8:04:39 AM By: Lenda Kelp PA-C Entered By: Lenda Kelp on 01/04/2018 09:21:48 Carnell, Paulett R. ([id number]) -------------------------------------------------------------------------------- Progress Note Details Patient Name: Jessica Casey R. Date of Service: 01/04/2018 8:45 AM Medical Record Number: [id number] Patient Account Number: [id number] Date of Birth/Sex: [birth year] (37 y.o. Female) Treating RN: Ashok Cordia, Debi Primary Care Provider: PATIENT, NO Other Clinician: Referring Provider: Referral, Self Treating Provider/Extender: STONE III,  Weeks in Treatment: 16 Subjective Chief Complaint Information obtained from Patient She is here in follow up evaluation of right lower extremity ulcers History of Present Illness (HPI) 37 year old patient well known to our [facility] wound care clinic where she has been seen since 2016 for bilateral lower extremity venous insufficiency disease with lymphedema and multiple ulcerations associated with morbid obesity. she had custom-made compression stockings and lymphedema pumps which were  used in the past. most recently she was admitted to the hospital between October 11 and 09/02/2017 with sepsis, lower extremity wounds and lymphedema.she was initially treated in the outpatient with Keflex and Bactrim. she was initially treated in the hospital with vancomycin and Zosyn and changed over to Unasyn until her white count improved and her blood cultures were negative for 3 days. After her inpatient management she was discharged home on Augmentin to end on 09/13/2017 with a 14 day course. she has had outpatient vascular duplex scans completed in November 2017 and her right ABI was 1.1 and the left ABI is 1.3. she had normal toe  brachial indices bilaterally.she had three-vessel runoff in the right lower extremity and two-vessel runoff in the left lower extremity. On questioning the patient she does have custom made compression stockings and also has a lymphedema pump but has not been using it appropriately and has not been taking good care of herself. 09/17/2017 -- she returns today with compression stockings on the left side and the right side has had significant amount of drainage and has a very strong odor 09/24/2017 -- the drainage is increased significantly and she has more lymphedema and a very strong odor to her wound. Though she does not have systemic symptoms, or overt infection I believe she will benefit from some doxycycline given empirically. 10/01/2017 -- after starting the doxycycline and changing the dressing twice a week her symptoms and signs have definitely improved overall. 10/08/2017 -- she has completed her course of doxycycline but continues to have a lot of drainage and needs twice a week dressing changes. 11/08/17-she is here in follow-up evaluation for right lower extremity ulcers. She admits to using her lymphedema pumps twice daily, one hour per session. she is voicing no complaints or concerns, no signs of infection [facility]17 on evaluation today patient appears to be doing very well in regard to her wounds. She has been tolerating the dressing changes she continues to develop some portly the adherent granular tissue on the surface of the wound with some Slough. Obviously we are trying to get too much better wound bed. With that being said the hyper granulation the Hydrofera Blue Dressing to have helped with which is excellent news. However I think it may be time to try something a little bit different at this point. 01/04/18 on evaluation today patient appears to be doing fairly well in regard to her right lateral ulcers. Both appear to be smaller compared to last week's  evaluation. She has been tolerating the wraps without complication she does have a little bit more swelling today but again she has been using her lymphedema pumps she states she's been up on her legs a little bit more just been busy with life in general. She is having no pain. Marlaine HindLEATH, Zaley R. ([id number]) Patient History Information obtained from Patient. Family History Kidney Disease - Mother, No family history of Cancer, Diabetes, Heart Disease, Hereditary Spherocytosis, Hypertension, Lung Disease, Seizures, Stroke, Thyroid Problems, Tuberculosis. Social History Never smoker, Marital Status - Married, Alcohol Use - Never, Drug Use - No History, Caffeine Use - Daily. Review of Systems (ROS) Constitutional Symptoms (General Health) Denies complaints or symptoms of Fever, Chills. Respiratory The patient has no complaints or symptoms. Cardiovascular Complains or has symptoms of LE edema. Psychiatric The patient has no complaints or symptoms. Objective Constitutional Obese and well-hydrated in no acute distress. Vitals Time Taken: 9:10 AM, Height: 74 in, Weight: 505 lbs, BMI: 64.8, Temperature:  98.4 F, Pulse: 97 bpm, Respiratory Rate: 18 breaths/min, Blood Pressure: 134/62 mmHg. Respiratory normal breathing without difficulty. Psychiatric this patient is able to make decisions and demonstrates good insight into disease process. Alert and Oriented x 3. pleasant and cooperative. General Notes: Patient's wound was slough covered on evaluation today did require sharp debridement which she tolerated without complication. Post debridement the wound appeared to be doing much better which is excellent news. Integumentary (Hair, Skin) Wound #2 status is Open. Original cause of wound was Gradually Appeared. The wound is located on the Right,Distal,Lateral Lower Leg. The wound measures 1.3cm length x 1cm width x 0.1cm depth; 1.021cm^2 area and 0.102cm^3 volume. There is Fat Layer  (Subcutaneous Tissue) Exposed exposed. There is no tunneling or undermining noted. There is a large amount of serosanguineous drainage noted. The wound margin is flat and intact. There is large (67-100%) red granulation within the wound bed. There is a small (1-33%) amount of necrotic tissue within the wound bed including Adherent Slough. The periwound skin appearance had no abnormalities noted for color. The periwound skin appearance exhibited: Scarring. The periwound skin appearance did not exhibit: Callus, Crepitus, Excoriation, Induration, Rash, Dry/Scaly, Maceration. Periwound Bourcier, Sarahi R. ([id number]) temperature was noted as No Abnormality. The periwound has tenderness on palpation. Assessment Active Problems ICD-10 L97.212 - 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d I89.0 - Lymphedema, not elsewhere classified I87.311 - Chronic venous hypertension (idiopathic) with ulcer of right lower extremity E66.01 - Morbid (severe) obesity due to excess calories Procedures Wound #2 Pre-procedure diagnosis of Wound #2 is a Lymphedema located on the Right,Distal,Lateral Lower Leg . There was a Skin/Subcutaneous Tissue Debridement ([id number]) debridement with total area of 1.3 sq cm performed by STONE III,  E., PA-C. with the following instrument(s): Curette to remove Viable tissue/material including Exudate, Fibrin/Slough, and Subcutaneous after achieving pain control using Lidocaine 4% Topical Solution. A time out was conducted at 09:22, prior to the start of the procedure. A Minimum amount of bleeding was controlled with Pressure. The procedure was tolerated well with a pain level of 0 throughout and a pain level of 0 following the procedure. Post Debridement Measurements: 1.3cm length x 1cm width x 0.2cm depth; 0.204cm^3 volume. Character of Wound/Ulcer Post Debridement requires further debridement. Post procedure Diagnosis Wound #2: Same as  Pre-Procedure Plan Wound Cleansing: Wound #2 Right,Distal,Lateral Lower Leg: Clean wound with Normal Saline. Cleanse wound with mild soap and water Anesthetic (add to Medication List): Wound #2 Right,Distal,Lateral Lower Leg: Topical Lidocaine 4% cream applied to wound bed prior to debridement (In Clinic Only). Skin Barriers/Peri-Wound Care: Wound #2 Right,Distal,Lateral Lower Leg: Barrier cream Moisturizing lotion Primary Wound Dressing: Wound #2 Right,Distal,Lateral Lower Leg: Prisma Ag Secondary Dressing: Wound #2 Right,Distal,Lateral Lower Leg: ABD pad Non-adherent pad Bulman, Anahli R. ([id number]) XtraSorb Other - charcoal Dressing Change Frequency: Wound #2 Right,Distal,Lateral Lower Leg: Change dressing every week Follow-up Appointments: Return Appointment in 1 week. Nurse Visit as needed Edema Control: Wound #2 Right,Distal,Lateral Lower Leg: 4-Layer Compression System - Right Lower Extremity - unna to anchor Elevate legs to the level of the heart and pump ankles as often as possible Additional Orders / Instructions: Wound #2 Right,Distal,Lateral Lower Leg: Vitamin A; Vitamin C, Zinc Increase protein intake. Other: - Please watch your salt (sodium) intake The following medication(s) was prescribed: lidocaine topical 4 % cream 1 1 cream topical was prescribed at facility I am going to recommend that we continue with the current wound care for the next week.  Patient is in agreement with plan. We will see were things stand following. Please see above for specific wound care orders. We will see patient for re-evaluation in 1 week(s) here in the clinic. If anything worsens or changes patient will contact our office for additional recommendations. Electronic Signature(s) Signed: 01/07/2018 8:04:39 AM By: Lenda Kelp PA-C Entered By: Lenda Kelp on 01/04/2018 09:38:47 Ulrey, Cristal Deer  ([id number]) -------------------------------------------------------------------------------- ROS/PFSH Details Patient Name: Jessica Casey R. Date of Service: 01/04/2018 8:45 AM Medical Record Number: [id number] Patient Account Number: [id number] Date of Birth/Sex: [birth year] (37 y.o. Female) Treating RN: Ashok Cordia, Debi Primary Care Provider: PATIENT, NO Other Clinician: Referring Provider: Referral, Self Treating Provider/Extender: STONE III,  Weeks in Treatment: 16 Information Obtained From Patient Wound History Do you currently have one or more open woundso Yes How many open wounds do you currently haveo 1 Approximately how long have you had your woundso 1 month How have you been treating your wound(s) until nowo aquacel ag Has your wound(s) ever healed and then re-openedo No Have you had any lab work done in the past montho No Have you tested positive for an antibiotic resistant organism (MRSA, VRE)o No Have you tested positive for osteomyelitis (bone infection)o No Have you had any tests for circulation on your legso Yes Who ordered the testo [facility] Where was the test doneo [facility] Constitutional Symptoms (General Health) Complaints and Symptoms: Negative for: Fever; Chills Cardiovascular Complaints and Symptoms: Positive for: LE edema Medical History: Positive for: Hypertension Negative for: Angina; Arrhythmia; Congestive Heart Failure; Coronary Artery Disease; Deep Vein Thrombosis; Hypotension; Myocardial Infarction; Peripheral Arterial Disease; Peripheral Venous Disease; Phlebitis; Vasculitis Hematologic/Lymphatic Medical History: Positive for: Lymphedema Respiratory Complaints and Symptoms: No Complaints or Symptoms Medical History: Negative for: Aspiration; Asthma; Chronic Obstructive Pulmonary Disease (COPD); Pneumothorax; Sleep Apnea; Tuberculosis Psychiatric Complaints and Symptoms: No Complaints or Symptoms Ransome, Shonia R.  ([id number]) Immunizations Pneumococcal Vaccine: Received Pneumococcal Vaccination: No Immunization Notes: up to date Implantable Devices Family and Social History Cancer: No; Diabetes: No; Heart Disease: No; Hereditary Spherocytosis: No; Hypertension: No; Kidney Disease: Yes - Mother; Lung Disease: No; Seizures: No; Stroke: No; Thyroid Problems: No; Tuberculosis: No; Never smoker; Marital Status - Married; Alcohol Use: Never; Drug Use: No History; Caffeine Use: Daily; Financial Concerns: No; Food, Clothing or Shelter Needs: No; Support System Lacking: No; Transportation Concerns: No; Advanced Directives: No; Patient does not want information on Advanced Directives Physician Affirmation I have reviewed and agree with the above information. Electronic Signature(s) Signed: 01/04/2018 4:55:35 PM By: [name] Signed: 01/07/2018 8:04:39 AM By: Lenda Kelp PA-C Entered By: Lenda Kelp on 01/04/2018 09:35:16 Allensworth, Oona R. ([id number]) -------------------------------------------------------------------------------- SuperBill Details Patient Name: Jessica Casey R. Date of Service: 01/04/2018 Medical Record Number: [id number] Patient Account Number: [id number] Date of Birth/Sex: [birth year] (37 y.o. 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 hypertension (idiopathic) with ulcer of right lower extremity E66.01 Morbid (severe) obesity due to excess calories Facility Procedures CPT4 Code: [id number] Description: 11042 - DEB SUBQ TISSUE 20 SQ CM/< ICD-10 Diagnosis Description L97.212 Non-pressure chronic ulcer of right calf with fat layer expo Modifier:  sed Quantity: 1 Physician Procedures CPT4 Code: [id number] Description: 11042 - WC PHYS SUBQ TISS 20 SQ CM ICD-10 Diagnosis Description L97.212  Non-pressure chronic ulcer of right calf with fat layer expo Modifier: sed Quantity: 1 Electronic Signature(s) Signed: 01/07/2018 8:04:39 AM By: Lenda Kelp PA-C Entered By: Lenda Kelp on 01/04/2018 09:41:05

## 2018-01-08 NOTE — Progress Notes (Signed)
[name], Verenice R. ([id number]) Visit Report for 01/04/2018 Arrival Information Details Patient Name: [name], [name] Date of Service: 01/04/2018 8:45 AM Medical Record Number: [id number] Patient Account Number: [id number] Date of Birth/Sex: 1981/03/14 (37 y.o. Female) Treating RN: Ashok CordiaPinkerton, Debi Primary Care Lilyian Quayle: PATIENT, NO Other Clinician: Referring Jahnya Trindade: Referral, Self Treating Marvel Mcphillips/Extender: STONE III, HOYT Weeks in Treatment: 16 Visit Information History Since Last Visit All ordered tests and consults were completed: No Patient Arrived: Ambulatory Added or deleted any medications: No Arrival Time: 09:10 Any new allergies or adverse reactions: No Accompanied By: self Had a fall or experienced change in No Transfer Assistance: None activities of daily living that may affect Patient Identification Verified: Yes risk of falls: Secondary Verification Process Completed: Yes Signs or symptoms of abuse/neglect since last visito No Patient Requires Transmission-Based No Hospitalized since last visit: No Precautions: Has Dressing in Place as Prescribed: Yes Patient Has Alerts: No Has Compression in Place as Prescribed: Yes Pain Present Now: No Electronic Signature(s) Signed: 01/04/2018 4:55:35 PM By: Alejandro MullingPinkerton, Debra Entered By: Alejandro MullingPinkerton, Debra on 01/04/2018 09:10:19 [name], [name] ([id number]) -------------------------------------------------------------------------------- Encounter Discharge Information Details Patient Name: [name], [name] Date of Service: 01/04/2018 8:45 AM Medical Record Number: [id number] Patient Account Number: [id number] Date of Birth/Sex: 1981/03/14 (37 y.o. Female) Treating RN: Ashok CordiaPinkerton, Debi Primary Care Micki Cassel: PATIENT, NO Other Clinician: Referring Bush Murdoch: Referral, Self Treating Jaidence Geisler/Extender: STONE III, HOYT Weeks in Treatment: 16 Encounter Discharge Information Items Discharge Pain Level: 0 Discharge Condition:  Stable Ambulatory Status: Ambulatory Discharge Destination: Home Transportation: Private Auto Accompanied By: self Schedule Follow-up Appointment: Yes Medication Reconciliation completed and No provided to Patient/Care Bartosz Luginbill: Provided on Clinical Summary of Care: 01/04/2018 Form Type Recipient Paper Patient EL Electronic Signature(s) Signed: 01/07/2018 4:58:43 PM By: Gwenlyn PerkingMoore, Shelia Entered By: Gwenlyn PerkingMoore, Shelia on 01/04/2018 09:44:31 [name], [name] ([id number]) -------------------------------------------------------------------------------- Lower Extremity Assessment Details Patient Name: [name], [name] Date of Service: 01/04/2018 8:45 AM Medical Record Number: [id number] Patient Account Number: [id number] Date of Birth/Sex: 1981/03/14 (37 y.o. Female) Treating RN: Ashok CordiaPinkerton, Debi Primary Care Alpa Salvo: PATIENT, NO Other Clinician: Referring Brandilee Pies: Referral, Self Treating Mahdi Frye/Extender: STONE III, HOYT Weeks in Treatment: 16 Edema Assessment Assessed: [Left: No] [Right: No] [Left: Edema] [Right: :] Calf Left: Right: Point of Measurement: 35 cm From Medial Instep cm 58.4 cm Ankle Left: Right: Point of Measurement: 9 cm From Medial Instep cm 36.5 cm Vascular Assessment Pulses: Dorsalis Pedis Palpable: [Right:Yes] Posterior Tibial Extremity colors, hair growth, and conditions: Extremity Color: [Right:Hyperpigmented] Temperature of Extremity: [Right:Warm] Capillary Refill: [Right:< 3 seconds] Toe Nail Assessment Left: Right: Thick: Yes Discolored: Yes Deformed: Yes Improper Length and Hygiene: Yes Electronic Signature(s) Signed: 01/04/2018 4:55:35 PM By: Alejandro MullingPinkerton, Debra Entered By: Alejandro MullingPinkerton, Debra on 01/04/2018 09:20:11 [name], [name] ([id number]) -------------------------------------------------------------------------------- Multi Wound Chart Details Patient Name: [name], [name] Date of Service: 01/04/2018 8:45 AM Medical Record Number: [id number] Patient  Account Number: [id number] Date of Birth/Sex: 1981/03/14 (37 y.o. Female) Treating RN: Ashok CordiaPinkerton, Debi Primary Care Janathan Bribiesca: PATIENT, NO Other Clinician: Referring Candice Tobey: Referral, Self Treating Amarian Botero/Extender: STONE III, HOYT Weeks in Treatment: 16 Vital Signs Height(in): 74 Pulse(bpm): 97 Weight(lbs): 505 Blood Pressure(mmHg): 134/62 Body Mass Index(BMI): 65 Temperature(F): 98.4 Respiratory Rate 18 (breaths/min): Photos: [2:No Photos] [N/A:N/A] Wound Location: [2:Right Lower Leg - Lateral, Distal] [N/A:N/A] Wounding Event: [2:Gradually Appeared] [N/A:N/A] Primary Etiology: [2:Lymphedema] [N/A:N/A] Comorbid History: [2:Lymphedema, Hypertension] [N/A:N/A] Date Acquired: [2:08/08/2017] [N/A:N/A] Weeks of Treatment: [2:16] [N/A:N/A] Wound Status: [2:Open] [N/A:N/A] Clustered Wound: [2:Yes] [N/A:N/A] Clustered Quantity: [2:2] [N/A:N/A] Measurements L x W x  D [2:1.3x1x0.1] [N/A:N/A] (cm) Area (cm) : [2:1.021] [N/A:N/A] Volume (cm) : [2:0.102] [N/A:N/A] % Reduction in Area: [2:95.20%] [N/A:N/A] % Reduction in Volume: [2:98.40%] [N/A:N/A] Classification: [2:Full Thickness With Exposed Support Structures] [N/A:N/A] Exudate Amount: [2:Large] [N/A:N/A] Exudate Type: [2:Serosanguineous] [N/A:N/A] Exudate Color: [2:red, brown] [N/A:N/A] Wound Margin: [2:Flat and Intact] [N/A:N/A] Granulation Amount: [2:Large (67-100%)] [N/A:N/A] Granulation Quality: [2:Red] [N/A:N/A] Necrotic Amount: [2:Small (1-33%)] [N/A:N/A] Exposed Structures: [2:Fat Layer (Subcutaneous Tissue) Exposed: Yes Fascia: No Tendon: No Muscle: No Joint: No Bone: No] [N/A:N/A] Epithelialization: [2:Small (1-33%)] [N/A:N/A] Periwound Skin Texture: [2:Scarring: Yes Excoriation: No Induration: No] [N/A:N/A] Callus: No Crepitus: No Rash: No Periwound Skin Moisture: Maceration: No N/A N/A Dry/Scaly: No Periwound Skin Color: Atrophie Blanche: No N/A N/A Cyanosis: No Ecchymosis: No Erythema:  No Hemosiderin Staining: No Mottled: No Pallor: No Rubor: No Temperature: No Abnormality N/A N/A Tenderness on Palpation: Yes N/A N/A Wound Preparation: Ulcer Cleansing: N/A N/A Rinsed/Irrigated with Saline, Other: soap and water Topical Anesthetic Applied: Other: lidocaine 4% Treatment Notes Electronic Signature(s) Signed: 01/04/2018 4:55:35 PM By: [name] Entered By: [name] on 01/04/2018 09:21:05 [name] ([id number]) -------------------------------------------------------------------------------- Multi-Disciplinary Care Plan Details Patient Name: Jessica Casey R. Date of Service: 01/04/2018 8:45 AM Medical Record Number: [id number] Patient Account Number: [id number] Date of Birth/Sex: 05-14-1981 (37 y.o. Female) Treating RN: Ashok Cordia, Debi Primary Care Kallan Merrick: PATIENT, NO Other Clinician: Referring Keron Neenan: Referral, Self Treating Chanell Nadeau/Extender: STONE III, HOYT Weeks in Treatment: 16 Active Inactive ` Orientation to the Wound Care Program Nursing Diagnoses: Knowledge deficit related to the wound healing center program Goals: Patient/caregiver will verbalize understanding of the Wound Healing Center Program Date Initiated: 09/10/2017 Target Resolution Date: 11/23/2017 Goal Status: Active Interventions: Provide education on orientation to the wound center Notes: ` Venous Leg Ulcer Nursing Diagnoses: Knowledge deficit related to disease process and management Goals: Patient/caregiver will verbalize understanding of disease process and disease management Date Initiated: 09/10/2017 Target Resolution Date: 11/23/2017 Goal Status: Active Interventions: Assess peripheral edema status every visit. Notes: ` Wound/Skin Impairment Nursing Diagnoses: Impaired tissue integrity Goals: Ulcer/skin breakdown will heal within 14 weeks Date Initiated: 09/10/2017 Target Resolution Date: 11/24/2017 Goal Status: Active Interventions: Jessica, [name]  ([id number]) Assess patient/caregiver ability to obtain necessary supplies Assess patient/caregiver ability to perform ulcer/skin care regimen upon admission and as needed Assess ulceration(s) every visit Notes: Electronic Signature(s) Signed: 01/04/2018 4:55:35 PM By: [name] Entered By: [name] on 01/04/2018 09:20:59 Brisk, Khaleelah R. ([id number]) -------------------------------------------------------------------------------- Pain Assessment Details Patient Name: Jessica Casey R. Date of Service: 01/04/2018 8:45 AM Medical Record Number: [id number] Patient Account Number: [id number] Date of Birth/Sex: [birth year] (37 y.o. Female) Treating RN: Ashok Cordia, Debi Primary Care Gillie Crisci: PATIENT, NO Other Clinician: Referring Micole Delehanty: Referral, Self Treating Jakiera Ehler/Extender: STONE III, HOYT Weeks in Treatment: 16 Active Problems Location of Pain Severity and Description of Pain Patient Has Paino No Site Locations Pain Management and Medication Current Pain Management: Electronic Signature(s) Signed: 01/04/2018 4:55:35 PM By: [name] Entered By: [name] on 01/04/2018 09:10:24 [name] ([id number]) -------------------------------------------------------------------------------- Patient/Caregiver Education Details Patient Name: Jessica Casey R. Date of Service: 01/04/2018 8:45 AM Medical Record Number: [id number] Patient Account Number: [id number] Date of Birth/Gender: 01/02/1981 (37 y.o. Female) Treating RN: Ashok Cordia, Debi Primary Care Physician: PATIENT, NO Other Clinician: Referring Physician: Referral, Self Treating Physician/Extender: STONE III, HOYT Weeks in Treatment: 16 Education Assessment Education Provided To: Patient Education Topics Provided Wound/Skin Impairment: Handouts: Caring for Your Ulcer, Other: do not get wraps wet, continue to use lymph pumps as prescribed Methods: Demonstration, Explain/Verbal  Responses: State content  correctly Electronic Signature(s) Signed: 01/04/2018 4:55:35 PM By: [name] Entered By: [name] on 01/04/2018 09:23:33 Barraco, Cristal Deer ([id number]) -------------------------------------------------------------------------------- Wound Assessment Details Patient Name: Jessica Casey R. Date of Service: 01/04/2018 8:45 AM Medical Record Number: [id number] Patient Account Number: [id number] Date of Birth/Sex: [birth year] (37 y.o. Female) Treating RN: Ashok Cordia, Debi Primary Care Danthony Kendrix: PATIENT, NO Other Clinician: Referring Shayley Medlin: Referral, Self Treating Shainna Faux/Extender: STONE III, HOYT Weeks in Treatment: 16 Wound Status Wound Number: 2 Primary Etiology: Lymphedema Wound Location: Right Lower Leg - Lateral, Distal Wound Status: Open Wounding Event: Gradually Appeared Comorbid History: Lymphedema, Hypertension Date Acquired: 08/08/2017 Weeks Of Treatment: 16 Clustered Wound: Yes Photos Photo Uploaded By: [name] on 01/04/2018 16:21:37 Wound Measurements Length: (cm) 1.3 Width: (cm) 1 Depth: (cm) 0.1 Clustered Quantity: 2 Area: (cm) 1.021 Volume: (cm) 0.102 % Reduction in Area: 95.2% % Reduction in Volume: 98.4% Epithelialization: Small (1-33%) Tunneling: No Undermining: No Wound Description Full Thickness With Exposed Support Classification: Structures Wound Margin: Flat and Intact Exudate Large Amount: Exudate Type: Serosanguineous Exudate Color: red, brown Foul Odor After Cleansing: No Slough/Fibrino Yes Wound Bed Granulation Amount: Large (67-100%) Exposed Structure Granulation Quality: Red Fascia Exposed: No Necrotic Amount: Small (1-33%) Fat Layer (Subcutaneous Tissue) Exposed: Yes Necrotic Quality: Adherent Slough Tendon Exposed: No Muscle Exposed: No Joint Exposed: No Townsend, Krishawna R. ([id number]) Bone Exposed: No Periwound Skin Texture Texture Color No Abnormalities Noted: No No Abnormalities Noted: Yes Callus: No  Temperature / Pain Crepitus: No Temperature: No Abnormality Excoriation: No Tenderness on Palpation: Yes Induration: No Rash: No Scarring: Yes Moisture No Abnormalities Noted: No Dry / Scaly: No Maceration: No Wound Preparation Ulcer Cleansing: Rinsed/Irrigated with Saline, Other: soap and water, Topical Anesthetic Applied: Other: lidocaine 4%, Treatment Notes Wound #2 (Right, Distal, Lateral Lower Leg) 1. Cleansed with: Clean wound with Normal Saline Cleanse wound with antibacterial soap and water 2. Anesthetic Topical Lidocaine 4% cream to wound bed prior to debridement 3. Peri-wound Care: Moisturizing lotion 4. Dressing Applied: Prisma Ag 5. Secondary Dressing Applied ABD Pad Dry Gauze 7. Secured with 4-Layer Compression System - Right Lower Extremity Notes xtrasorb, charcoal, unna to anchor Electronic Signature(s) Signed: 01/04/2018 4:55:35 PM By: [name] Entered By: [name] on 01/04/2018 09:18:45 Broad, Anani R. ([id number]) -------------------------------------------------------------------------------- Vitals Details Patient Name: Jessica Casey R. Date of Service: 01/04/2018 8:45 AM Medical Record Number: [id number] Patient Account Number: [id number] Date of Birth/Sex: 1981-05-18 (37 y.o. Female) Treating RN: Ashok Cordia, Debi Primary Care Chidubem Chaires: PATIENT, NO Other Clinician: Referring Aryahi Denzler: Referral, Self Treating Welcome Fults/Extender: STONE III, HOYT Weeks in Treatment: 16 Vital Signs Time Taken: 09:10 Temperature (F): 98.4 Height (in): 74 Pulse (bpm): 97 Weight (lbs): 505 Respiratory Rate (breaths/min): 18 Body Mass Index (BMI): 64.8 Blood Pressure (mmHg): 134/62 Reference Range: 80 - 120 mg / dl Electronic Signature(s) Signed: 01/04/2018 4:55:35 PM By: [name] Entered By: [name] on 01/04/2018 09:14:36

## 2018-01-11 ENCOUNTER — Encounter: Payer: Self-pay | Admitting: Physician Assistant

## 2018-01-15 NOTE — Progress Notes (Signed)
ADREA, SHERPA ([id number]) Visit Report for 01/11/2018 Chief Complaint Document Details Patient Name: [name], [name] Date of Service: 01/11/2018 9:00 AM Medical Record Number: [id number] Patient Account Number: [id number] Date of Birth/Sex: 1981-07-15 (37 y.o. Female) Treating RN: Curtis Sites Primary Care Provider: PATIENT, NO Other Clinician: Referring Provider: Referral, Self Treating Provider/Extender: STONE III, HOYT Weeks in Treatment: 17 Information Obtained from: Patient Chief Complaint She is here in follow up evaluation of right lower extremity ulcers Electronic Signature(s) Signed: 01/12/2018 12:37:14 PM By: Lenda Kelp PA-C Entered By: Lenda Kelp on 01/11/2018 09:19:18 Bellucci, Avari R. ([id number]) -------------------------------------------------------------------------------- Debridement Details Patient Name: Jessica Casey R. Date of Service: 01/11/2018 9:00 AM Medical Record Number: [id number] Patient Account Number: [id number] Date of Birth/Sex: 11-01-1981 (37 y.o. Female) Treating RN: Curtis Sites Primary Care Provider: PATIENT, NO Other Clinician: Referring Provider: Referral, Self Treating Provider/Extender: STONE III, HOYT Weeks in Treatment: 17 Debridement Performed for Wound #2 Right,Distal,Lateral Lower Leg Assessment: Performed By: Physician STONE III, HOYT E., PA-C Debridement: Debridement Pre-procedure Verification/Time Yes - 10:03 Out Taken: Start Time: 10:03 Pain Control: Lidocaine 4% Topical Solution Level: Skin/Subcutaneous Tissue Total Area Debrided (L x W): 3.1 (cm) x 3.8 (cm) = 11.78 (cm) Tissue and other material Viable, Non-Viable, Fibrin/Slough, Subcutaneous debrided: Instrument: Curette Bleeding: Minimum Hemostasis Achieved: Pressure End Time: 10:06 Procedural Pain: 0 Post Procedural Pain: 0 Response to Treatment: Procedure was tolerated well Post Debridement Measurements of Total Wound Length: (cm) 3.1 Width: (cm)  3.8 Depth: (cm) 0.2 Volume: (cm) 1.85 Character of Wound/Ulcer Post Debridement: Improved Post Procedure Diagnosis Same as Pre-procedure Electronic Signature(s) Signed: 01/11/2018 5:21:02 PM By: Curtis Sites Signed: 01/12/2018 12:37:14 PM By: Lenda Kelp PA-C Entered By: Curtis Sites on 01/11/2018 10:06:54 Bagg, Tajanae R. ([id number]) -------------------------------------------------------------------------------- HPI Details Patient Name: Jessica Casey R. Date of Service: 01/11/2018 9:00 AM Medical Record Number: [id number] Patient Account Number: [id number] Date of Birth/Sex: 04-Oct-1981 (37 y.o. Female) Treating RN: Curtis Sites Primary Care Provider: PATIENT, NO Other Clinician: Referring Provider: Referral, Self Treating Provider/Extender: STONE III, HOYT Weeks in Treatment: 17 History of Present Illness HPI Description: 37 year old patient well known to our [facility] wound care clinic where she has been seen since 2016 for bilateral lower extremity venous insufficiency disease with lymphedema and multiple ulcerations associated with morbid obesity. she had custom-made compression stockings and lymphedema pumps which were used in the past. most recently she was admitted to the hospital between October 11 and 09/02/2017 with sepsis, lower extremity wounds and lymphedema.she was initially treated in the outpatient with Keflex and Bactrim. she was initially treated in the hospital with vancomycin and Zosyn and changed over to Unasyn until her white count improved and her blood cultures were negative for 3 days. After her inpatient management she was discharged home on Augmentin to end on 09/13/2017 with a 14 day course. she has had outpatient vascular duplex scans completed in November 2017 and her right ABI was 1.1 and the left ABI is 1.3. she had normal toe brachial indices bilaterally.she had three-vessel runoff in the right lower extremity and two-vessel runoff in the left  lower extremity. On questioning the patient she does have custom made compression stockings and also has a lymphedema pump but has not been using it appropriately and has not been taking good care of herself. 09/17/2017 -- she returns today with compression stockings on the left side and the right side has had significant amount of drainage and has a very strong odor 09/24/2017 -- the drainage is increased  significantly and she has more lymphedema and a very strong odor to her wound. Though she does not have systemic symptoms, or overt infection I believe she will benefit from some doxycycline given empirically. 10/01/2017 -- after starting the doxycycline and changing the dressing twice a week her symptoms and signs have definitely improved overall. 10/08/2017 -- she has completed her course of doxycycline but continues to have a lot of drainage and needs twice a week dressing changes. 11/08/17-she is here in follow-up evaluation for right lower extremity ulcers. She admits to using her lymphedema pumps twice daily, one hour per session. she is voicing no complaints or concerns, no signs of infection will change to [facility] 12/14/17 on evaluation today patient appears to be doing very well in regard to her wounds. She has been tolerating the dressing changes she continues to develop some portly the adherent granular tissue on the surface of the wound with some Slough. Obviously we are trying to get too much better wound bed. With that being said the hyper granulation the Hydrofera Blue Dressing to have helped with which is excellent news. However I think it may be time to try something a little bit different at this point. 01/11/18 on evaluation today patient appears to be doing fairly well in regard to her right lateral lower extremity ulcers. This shows excellent signs of filling in which is great news. There does not appear to be any evidence of infection which is also good news. She does  continue to work as well is good school. She is having no pain. Electronic Signature(s) Signed: 01/12/2018 12:37:14 PM By: Lenda Kelp PA-C Entered By: Lenda Kelp on 01/12/2018 11:45:38 Joy, Domnique RMarland Kitchen ([id number]) -------------------------------------------------------------------------------- Physical Exam Details Patient Name: Jessica Casey R. Date of Service: 01/11/2018 9:00 AM Medical Record Number: [id number] Patient Account Number: [id number] Date of Birth/Sex: [birth year] (37 y.o. Female) Treating RN: Curtis Sites Primary Care Provider: PATIENT, NO Other Clinician: Referring Provider: Referral, Self Treating Provider/Extender: STONE III, HOYT Weeks in Treatment: 17 Constitutional Well-nourished and well-hydrated in no acute distress. Respiratory normal breathing without difficulty. Psychiatric this patient is able to make decisions and demonstrates good insight into disease process. Alert and Oriented x 3. pleasant and cooperative. Notes At this point patient's wound did show evidence of slough covering the granulation bed. This was sharply debrided today without complication no pain. Post debridement the wound was doing much better. Electronic Signature(s) Signed: 01/12/2018 12:37:14 PM By: Lenda Kelp PA-C Entered By: Lenda Kelp on 01/12/2018 11:46:12 Heroux, Ratasha RMarland Kitchen ([id number]) -------------------------------------------------------------------------------- Physician Orders Details Patient Name: Jessica Casey R. Date of Service: 01/11/2018 9:00 AM Medical Record Number: [id number] Patient Account Number: [id number] Date of Birth/Sex: 10-Oct-1981 (37 y.o. Female) Treating RN: Curtis Sites Primary Care Provider: PATIENT, NO Other Clinician: Referring Provider: Referral, Self Treating Provider/Extender: STONE III, HOYT Weeks in Treatment: 17 Verbal / Phone Orders: No Diagnosis Coding ICD-10 Coding Code Description [phone] Non-pressure chronic ulcer of  right calf with fat layer exposed I89.0 Lymphedema, not elsewhere classified I87.311 Chronic venous hypertension (idiopathic) with ulcer of right lower extremity E66.01 Morbid (severe) obesity due to excess calories Wound Cleansing Wound #2 Right,Distal,Lateral Lower Leg o Clean wound with Normal Saline. o Cleanse wound with mild soap and water Anesthetic (add to Medication List) Wound #2 Right,Distal,Lateral Lower Leg o Topical Lidocaine 4% cream applied to wound bed prior to debridement (In Clinic Only). Skin Barriers/Peri-Wound Care Wound #2 Right,Distal,Lateral Lower Leg o Barrier cream o Moisturizing lotion Primary Wound  Dressing Wound #2 Right,Distal,Lateral Lower Leg o Prisma Ag Secondary Dressing Wound #2 Right,Distal,Lateral Lower Leg o ABD pad o Non-adherent pad o XtraSorb o Other - charcoal Dressing Change Frequency Wound #2 Right,Distal,Lateral Lower Leg o Change dressing every week Follow-up Appointments o Return Appointment in 1 week. o Nurse Visit as needed Edema Control Gowan, Lauralie R. ([id number]) Wound #2 Right,Distal,Lateral Lower Leg o 4-Layer Compression System - Right Lower Extremity - unna to anchor o Elevate legs to the level of the heart and pump ankles as often as possible Additional Orders / Instructions Wound #2 Right,Distal,Lateral Lower Leg o Vitamin A; Vitamin C, Zinc o Increase protein intake. o Other: - Please watch your salt (sodium) intake Electronic Signature(s) Signed: 01/11/2018 5:21:02 PM By: Curtis Sitesorthy, Joanna Signed: 01/12/2018 12:37:14 PM By: Lenda KelpStone III, Hoyt PA-C Entered By: Curtis Sitesorthy, Joanna on 01/11/2018 10:07:14 Stiver, Katiria R. ([id number]) -------------------------------------------------------------------------------- Problem List Details Patient Name: Jessica CaseyLEATH, Kaizlee R. Date of Service: 01/11/2018 9:00 AM Medical Record Number: [id number] Patient Account Number: [id number] Date of Birth/Sex:  [birth year] (37 y.o. Female) Treating RN: Curtis Sitesorthy, Joanna Primary Care Provider: PATIENT, NO Other Clinician: Referring Provider: Referral, Self Treating Provider/Extender: Linwood DibblesSTONE III, HOYT Weeks in Treatment: 17 Active Problems ICD-10 Encounter Code Description Active Date Diagnosis L97.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d 09/10/2017 Yes I89.0 Lymphedema, not elsewhere classified 09/10/2017 Yes I87.311 Chronic venous hypertension (idiopathic) with ulcer of right lower 09/10/2017 Yes extremity E66.01 Morbid (severe) obesity due to excess calories 09/10/2017 Yes Inactive Problems Resolved Problems Electronic Signature(s) Signed: 01/12/2018 12:37:14 PM By: Lenda KelpStone III, Hoyt PA-C Entered By: Lenda KelpStone III, Hoyt on 01/11/2018 09:19:11 Cummings, Josey R. ([id number]) -------------------------------------------------------------------------------- Progress Note Details Patient Name: Jessica CaseyLEATH, Gwyndolyn R. Date of Service: 01/11/2018 9:00 AM Medical Record Number: [id number] Patient Account Number: [id number] Date of Birth/Sex: [birth year] (37 y.o. Female) Treating RN: Curtis Sitesorthy, Joanna Primary Care Provider: PATIENT, NO Other Clinician: Referring Provider: Referral, Self Treating Provider/Extender: STONE III, HOYT Weeks in Treatment: 17 Subjective Chief Complaint Information obtained from Patient She is here in follow up evaluation of right lower extremity ulcers History of Present Illness (HPI) 37 year old patient [facility] she has been seen since 2016 for bilateral lower extremity venous insufficiency disease with lymphedema and multiple ulcerations associated with morbid obesity. she had custom-made compression stockings and lymphedema pumps which were used in the past. most recently she was admitted to the hospital between October 11 and 09/02/2017 with sepsis, lower extremity wounds and lymphedema.she was initially treated in the  outpatient with Keflex and Bactrim. she was initially treated in the hospital with vancomycin and Zosyn and changed over to Unasyn until her white count improved and her blood cultures were negative for 3 days. After her inpatient management she was discharged home on Augmentin to end on 09/13/2017 with a 14 day course. she has had outpatient vascular duplex scans completed in November 2017 and her right ABI was 1.1 and the left ABI is 1.3. she had normal toe brachial indices bilaterally.she had three-vessel runoff in the right lower extremity and two-vessel runoff in the left lower extremity. On questioning the patient she does have custom made compression stockings and also has a lymphedema pump but has not been using it appropriately and has not been taking good care of herself. 09/17/2017 -- she returns today with compression stockings on the left side and the right side has had significant amount of drainage and has a very strong odor 09/24/2017 -- the drainage  is increased significantly and she has more lymphedema and a very strong odor to her wound. Though she does not have systemic symptoms, or overt infection I believe she will benefit from some doxycycline given empirically. 10/01/2017 -- after starting the doxycycline and changing the dressing twice a week her symptoms and signs have definitely improved overall. 10/08/2017 -- she has completed her course of doxycycline but continues to have a lot of drainage and needs twice a week dressing changes. 11/08/17-she is here in follow-up evaluation for right lower extremity ulcers. She admits to using her lymphedema pumps twice daily, one hour per session. she is voicing no complaints or concerns, no signs of infection will change to [facility] 12/14/17 on evaluation today patient appears to be doing very well in regard to her wounds. She has been tolerating the dressing changes she continues to develop some portly the adherent granular tissue  on the surface of the wound with some Slough. Obviously we are trying to get too much better wound bed. With that being said the hyper granulation the Hydrofera Blue Dressing to have helped with which is excellent news. However I think it may be time to try something a little bit different at this point. 01/11/18 on evaluation today patient appears to be doing fairly well in regard to her right lateral lower extremity ulcers. This shows excellent signs of filling in which is great news. There does not appear to be any evidence of infection which is also good news. She does continue to work as well is good school. She is having no pain. CHAITRA, MAST ([id number]) Patient History Information obtained from Patient. Family History Kidney Disease - Mother, No family history of Cancer, Diabetes, Heart Disease, Hereditary Spherocytosis, Hypertension, Lung Disease, Seizures, Stroke, Thyroid Problems, Tuberculosis. Social History Never smoker, Marital Status - Married, Alcohol Use - Never, Drug Use - No History, Caffeine Use - Daily. Review of Systems (ROS) Constitutional Symptoms (General Health) Denies complaints or symptoms of Fever, Chills. Respiratory The patient has no complaints or symptoms. Cardiovascular Complains or has symptoms of LE edema. Objective Constitutional Well-nourished and well-hydrated in no acute distress. Vitals Time Taken: 9:33 AM, Height: 74 in, Weight: 505 lbs, BMI: 64.8, Temperature: 98.0 F, Pulse: 88 bpm, Respiratory Rate: 18 breaths/min, Blood Pressure: 128/75 mmHg. Respiratory normal breathing without difficulty. Psychiatric this patient is able to make decisions and demonstrates good insight into disease process. Alert and Oriented x 3. pleasant and cooperative. General Notes: At this point patient's wound did show evidence of slough covering the granulation bed. This was sharply debrided today without complication no pain. Post debridement the wound was  doing much better. Integumentary (Hair, Skin) Wound #2 status is Open. Original cause of wound was Gradually Appeared. The wound is located on the Right,Distal,Lateral Lower Leg. The wound measures 3.1cm length x 3.8cm width x 0.2cm depth; 9.252cm^2 area and 1.85cm^3 volume. There is Fat Layer (Subcutaneous Tissue) Exposed exposed. There is no tunneling or undermining noted. There is a large amount of serosanguineous drainage noted. The wound margin is flat and intact. There is large (67-100%) red granulation within the wound bed. There is a small (1-33%) amount of necrotic tissue within the wound bed including Adherent Slough. The periwound skin appearance had no abnormalities noted for color. The periwound skin appearance exhibited: Scarring. The periwound skin appearance did not exhibit: Callus, Crepitus, Excoriation, Induration, Rash, Dry/Scaly, Maceration. Periwound temperature was noted as No Abnormality. The periwound has tenderness on palpation. TAWNI, MELKONIAN R. ([id number]) Assessment  Active Problems ICD-10 L97.212 - 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d I89.0 - Lymphedema, not elsewhere classified I87.311 - Chronic venous hypertension (idiopathic) with ulcer of right lower extremity E66.01 - Morbid (severe) obesity due to excess calories Procedures Wound #2 Pre-procedure diagnosis of Wound #2 is a Lymphedema located on the Right,Distal,Lateral Lower Leg . There was a Skin/Subcutaneous Tissue Debridement ([id number]) debridement with total area of 11.78 sq cm performed by STONE III, HOYT E., PA-C. with the following instrument(s): Curette to remove Viable and Non-Viable tissue/material including Fibrin/Slough and Subcutaneous after achieving pain control using Lidocaine 4% Topical Solution. A time out was conducted at 10:03, prior to the start of the procedure. A Minimum amount of bleeding was controlled with Pressure. The procedure was tolerated well with a  pain level of 0 throughout and a pain level of 0 following the procedure. Post Debridement Measurements: 3.1cm length x 3.8cm width x 0.2cm depth; 1.85cm^3 volume. Character of Wound/Ulcer Post Debridement is improved. Post procedure Diagnosis Wound #2: Same as Pre-Procedure Plan Wound Cleansing: Wound #2 Right,Distal,Lateral Lower Leg: Clean wound with Normal Saline. Cleanse wound with mild soap and water Anesthetic (add to Medication List): Wound #2 Right,Distal,Lateral Lower Leg: Topical Lidocaine 4% cream applied to wound bed prior to debridement (In Clinic Only). Skin Barriers/Peri-Wound Care: Wound #2 Right,Distal,Lateral Lower Leg: Barrier cream Moisturizing lotion Primary Wound Dressing: Wound #2 Right,Distal,Lateral Lower Leg: Prisma Ag Secondary Dressing: Wound #2 Right,Distal,Lateral Lower Leg: ABD pad Non-adherent pad XtraSorb Other - charcoal Dressing Change Frequency: Finfrock, Vivion R. ([id number]) Wound #2 Right,Distal,Lateral Lower Leg: Change dressing every week Follow-up Appointments: Return Appointment in 1 week. Nurse Visit as needed Edema Control: Wound #2 Right,Distal,Lateral Lower Leg: 4-Layer Compression System - Right Lower Extremity - unna to anchor Elevate legs to the level of the heart and pump ankles as often as possible Additional Orders / Instructions: Wound #2 Right,Distal,Lateral Lower Leg: Vitamin A; Vitamin C, Zinc Increase protein intake. Other: - Please watch your salt (sodium) intake I am going to recommend that we continue with the current wound care measures for the next week and see were things stand. Patient is in agreement with the plan. If anything changes or worsens she will contact the office to let us know. Please see above for specific wound care orders. We will see patient for re-evaluation in 1 week(s) here in the clinic. If anything worsens or changes patient will contact our office for additional recommendations. Electronic  Signature(s) Signed: 01/12/2018 12:37:14 PM By: Lenda Kelp PA-C Entered By: Lenda Kelp on 01/12/2018 11:46:28 Ripoll, Cristal Deer ([id number]) -------------------------------------------------------------------------------- ROS/PFSH Details Patient Name: Jessica Casey R. Date of Service: 01/11/2018 9:00 AM Medical Record Number: [id number] Patient Account Number: [id number] Date of Birth/Sex: 07/19/1981 (37 y.o. Female) Treating RN: Curtis Sites Primary Care Provider: PATIENT, NO Other Clinician: Referring Provider: Referral, Self Treating Provider/Extender: STONE III, HOYT Weeks in Treatment: 17 Information Obtained From Patient Wound History Do you currently have one or more open woundso Yes How many open wounds do you currently haveo 1 Approximately how long have you had your woundso 1 month How have you been treating your wound(s) until nowo aquacel ag Has your wound(s) ever healed and then re-openedo No Have you had any lab work done in the past montho No Have you tested positive for an antibiotic resistant organism (MRSA, VRE)o No Have you tested positive for osteomyelitis (bone infection)o No Have you had any tests for circulation on your legso Yes Who  ordered the testo [facility] Where was the test doneo [facility] Constitutional Symptoms (General Health) Complaints and Symptoms: Negative for: Fever; Chills Cardiovascular Complaints and Symptoms: Positive for: LE edema Medical History: Positive for: Hypertension Negative for: Angina; Arrhythmia; Congestive Heart Failure; Coronary Artery Disease; Deep Vein Thrombosis; Hypotension; Myocardial Infarction; Peripheral Arterial Disease; Peripheral Venous Disease; Phlebitis; Vasculitis Hematologic/Lymphatic Medical History: Positive for: Lymphedema Respiratory Complaints and Symptoms: No Complaints or Symptoms Medical History: Negative for: Aspiration; Asthma; Chronic Obstructive Pulmonary Disease (COPD); Pneumothorax; Sleep  Apnea; Tuberculosis Immunizations Pneumococcal Vaccine: Received Pneumococcal Vaccination: No Immunization NotesHAILEI, BESSER ([id number]) up to date Implantable Devices Family and Social History Cancer: No; Diabetes: No; Heart Disease: No; Hereditary Spherocytosis: No; Hypertension: No; Kidney Disease: Yes - Mother; Lung Disease: No; Seizures: No; Stroke: No; Thyroid Problems: No; Tuberculosis: No; Never smoker; Marital Status - Married; Alcohol Use: Never; Drug Use: No History; Caffeine Use: Daily; Financial Concerns: No; Food, Clothing or Shelter Needs: No; Support System Lacking: No; Transportation Concerns: No; Advanced Directives: No; Patient does not want information on Advanced Directives Physician Affirmation I have reviewed and agree with the above information. Electronic Signature(s) Signed: 01/12/2018 12:37:14 PM By: Lenda Kelp PA-C Signed: 01/14/2018 4:35:46 PM By: Curtis Sites Entered By: Lenda Kelp on 01/12/2018 11:45:56 Betts, Massa R. ([id number]) -------------------------------------------------------------------------------- SuperBill Details Patient Name: Jessica Casey R. Date of Service: 01/11/2018 Medical Record Number: [id number] Patient Account Number: [id number] Date of Birth/Sex: Jan 23, 1981 (37 y.o. Female) Treating RN: Curtis Sites Primary Care Provider: PATIENT, NO Other Clinician: Referring Provider: Referral, Self Treating Provider/Extender: STONE III, HOYT Weeks in Treatment: 17 Diagnosis Coding ICD-10 Codes Code Description [phone] 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d I89.0 Lymphedema, not elsewhere classified I87.311 Chronic venous hypertension (idiopathic) with ulcer of right lower extremity E66.01 Morbid (severe) obesity due to excess calories Facility Procedures CPT4 Code: [id number] Description: 11042 - DEB SUBQ TISSUE 20 SQ CM/< ICD-10 Diagnosis Description L97.212 Non-pressure chronic ulcer of right  calf with fat layer expo Modifier: sed Quantity: 1 Physician Procedures CPT4 Code: [id number] Description: 11042 - WC PHYS SUBQ TISS 20 SQ CM ICD-10 Diagnosis Description L97.212 Non-pressure chronic ulcer of right calf with fat layer expo Modifier: sed Quantity: 1 Electronic Signature(s) Signed: 01/12/2018 12:37:14 PM By: Lenda Kelp PA-C Entered By: Lenda Kelp on 01/12/2018 11:46:43

## 2018-01-17 NOTE — Progress Notes (Signed)
TYLIAH, SCHLERETH ([id number]) Visit Report for 01/11/2018 Arrival Information Details Patient Name: [name], [name] Date of Service: 01/11/2018 9:00 AM Medical Record Number: [id number] Patient Account Number: [id number] Date of Birth/Sex: [birth year] (37 y.o. Female) Treating RN: Renne Crigler Primary Care Daymeon Fischman: PATIENT, NO Other Clinician: Referring Mackenze Grandison: Referral, Self Treating Valor [place]ber[place]/Extender: STONE III, HOYT Weeks in Treatment: 17 Visit Information History Since Last Visit All ordered tests and consults were completed: No Patient Arrived: Ambulatory Added or deleted any medications: No Arrival Time: 09:31 Any new allergies or adverse reactions: No Accompanied By: self Had a fall or experienced change in No Transfer Assistance: None activities of daily living that may affect Patient Identification Verified: Yes risk of falls: Secondary Verification Process Completed: Yes Signs or symptoms of abuse/neglect since last visito No Patient Requires Transmission-Based No Hospitalized since last visit: No Precautions: Pain Present Now: No Patient Has Alerts: No Electronic Signature(s) Signed: 01/11/2018 4:58:29 PM By: Renne Crigler Entered By: Renne Crigler on 01/11/2018 09:31:55 [name], [name] ([id number]) -------------------------------------------------------------------------------- Encounter Discharge Information Details Patient Name: Jessica Casey R. Date of Service: 01/11/2018 9:00 AM Medical Record Number: [id number] Patient Account Number: [id number] Date of Birth/Sex: [birth year] (37 y.o. Female) Treating RN: Ashok Cordia, Debi Primary Care Iszabella Hebenstreit: PATIENT, NO Other Clinician: Referring Devon Kingdon: Referral, Self Treating Tijuana Scheidegger/Extender: STONE III, HOYT Weeks in Treatment: 17 Encounter Discharge Information Items Discharge Pain Level: 0 Discharge Condition: Stable Ambulatory Status: Ambulatory Discharge Destination: Home Transportation: Private  Auto Accompanied By: self Schedule Follow-up Appointment: Yes Medication Reconciliation completed and No provided to Patient/Care Jermiah Soderman: Provided on Clinical Summary of Care: 01/11/2018 Form Type Recipient Paper Patient EL Electronic Signature(s) Signed: 01/15/2018 8:07:32 AM By: Gwenlyn Perking Entered By: Gwenlyn Perking on 01/11/2018 10:32:58 [name], [name] ([id number]) -------------------------------------------------------------------------------- Lower Extremity Assessment Details Patient Name: Jessica Casey R. Date of Service: 01/11/2018 9:00 AM Medical Record Number: [id number] Patient Account Number: [id number] Date of Birth/Sex: 1981-07-04 (37 y.o. Female) Treating RN: Renne Crigler Primary Care Landree Fernholz: PATIENT, NO Other Clinician: Referring Margan Elias: Referral, Self Treating Jayra Choyce/Extender: STONE III, HOYT Weeks in Treatment: 17 Edema Assessment Assessed: [Left: No] [Right: No] Edema: [Left: Ye] [Right: s] Calf Left: Right: Point of Measurement: cm From Medial Instep cm 60.5 cm Ankle Left: Right: Point of Measurement: cm From Medial Instep cm 36.5 cm Vascular Assessment Claudication: Claudication Assessment [Right:None] Pulses: Dorsalis Pedis Palpable: [Right:Yes] Posterior Tibial Extremity colors, hair growth, and conditions: Extremity Color: [Right:Hyperpigmented] Hair Growth on Extremity: [Right:Yes] Temperature of Extremity: [Right:Warm] Capillary Refill: [Right:< 3 seconds] Toe Nail Assessment Left: Right: Thick: Yes Discolored: Yes Deformed: Yes Improper Length and Hygiene: Yes Electronic Signature(s) Signed: 01/11/2018 4:58:29 PM By: Renne Crigler Entered By: Renne Crigler on 01/11/2018 09:46:52 [name], [name] ([id number]) -------------------------------------------------------------------------------- Multi Wound Chart Details Patient Name: Jessica Casey R. Date of Service: 01/11/2018 9:00 AM Medical Record Number: [id number] Patient  Account Number: [id number] Date of Birth/Sex: February 04, 1981 (37 y.o. Female) Treating RN: Curtis Sites Primary Care Azalynn Maxim: PATIENT, NO Other Clinician: Referring Lavine Hargrove: Referral, Self Treating Gennie Dib/Extender: STONE III, HOYT Weeks in Treatment: 17 Vital Signs Height(in): 74 Pulse(bpm): 88 Weight(lbs): 505 Blood Pressure(mmHg): 128/75 Body Mass Index(BMI): 65 Temperature(F): 98.0 Respiratory Rate 18 (breaths/min): Photos: [2:No Photos] [N/A:N/A] Wound Location: [2:Right Lower Leg - Lateral, Distal] [N/A:N/A] Wounding Event: [2:Gradually Appeared] [N/A:N/A] Primary Etiology: [2:Lymphedema] [N/A:N/A] Comorbid History: [2:Lymphedema, Hypertension] [N/A:N/A] Date Acquired: [2:08/08/2017] [N/A:N/A] Weeks of Treatment: [2:17] [N/A:N/A] Wound Status: [2:Open] [N/A:N/A] Clustered Wound: [2:Yes] [N/A:N/A] Clustered Quantity: [2:2] [N/A:N/A] Measurements L x W x D [2:3.1x3.8x0.2] [N/A:N/A] (cm) Area (cm) : [  2:9.252] [N/A:N/A] Volume (cm) : [2:1.85] [N/A:N/A] % Reduction in Area: [2:56.80%] [N/A:N/A] % Reduction in Volume: [2:71.20%] [N/A:N/A] Classification: [2:Full Thickness With Exposed Support Structures] [N/A:N/A] Exudate Amount: [2:Large] [N/A:N/A] Exudate Type: [2:Serosanguineous] [N/A:N/A] Exudate Color: [2:red, brown] [N/A:N/A] Wound Margin: [2:Flat and Intact] [N/A:N/A] Granulation Amount: [2:Large (67-100%)] [N/A:N/A] Granulation Quality: [2:Red] [N/A:N/A] Necrotic Amount: [2:Small (1-33%)] [N/A:N/A] Exposed Structures: [2:Fat Layer (Subcutaneous Tissue) Exposed: Yes Fascia: No Tendon: No Muscle: No Joint: No Bone: No] [N/A:N/A] Epithelialization: [2:Small (1-33%)] [N/A:N/A] Periwound Skin Texture: [2:Scarring: Yes Excoriation: No Induration: No] [N/A:N/A] Callus: No Crepitus: No Rash: No Periwound Skin Moisture: Maceration: No N/A N/A Dry/Scaly: No Periwound Skin Color: Atrophie Blanche: No N/A N/A Cyanosis: No Ecchymosis: No Erythema:  No Hemosiderin Staining: No Mottled: No Pallor: No Rubor: No Temperature: No Abnormality N/A N/A Tenderness on Palpation: Yes N/A N/A Wound Preparation: Ulcer Cleansing: N/A N/A Rinsed/Irrigated with Saline, Other: soap and water Topical Anesthetic Applied: Other: lidocaine 4% Treatment Notes Electronic Signature(s) Signed: 01/11/2018 5:21:02 PM By: Curtis Sitesorthy, Joanna Entered By: Curtis Sitesorthy, Joanna on 01/11/2018 10:03:15 [name], [name] ([id number]) -------------------------------------------------------------------------------- Multi-Disciplinary Care Plan Details Patient Name: Jessica CaseyLEATH, Jilda R. Date of Service: 01/11/2018 9:00 AM Medical Record Number: [id number] Patient Account Number: [id number] Date of Birth/Sex: 03-23-1981 (37 y.o. Female) Treating RN: Renne CriglerFlinchum, Cheryl Primary Care Naftali Carchi: PATIENT, NO Other Clinician: Referring Desmond Tufano: Referral, Self Treating Caymen Dubray/Extender: STONE III, HOYT Weeks in Treatment: 17 Active Inactive ` Orientation to the Wound Care Program Nursing Diagnoses: Knowledge deficit related to the wound healing center program Goals: Patient/caregiver will verbalize understanding of the Wound Healing Center Program Date Initiated: 09/10/2017 Target Resolution Date: 11/23/2017 Goal Status: Active Interventions: Provide education on orientation to the wound center Notes: ` Venous Leg Ulcer Nursing Diagnoses: Knowledge deficit related to disease process and management Goals: Patient/caregiver will verbalize understanding of disease process and disease management Date Initiated: 09/10/2017 Target Resolution Date: 11/23/2017 Goal Status: Active Interventions: Assess peripheral edema status every visit. Notes: ` Wound/Skin Impairment Nursing Diagnoses: Impaired tissue integrity Goals: Ulcer/skin breakdown will heal within 14 weeks Date Initiated: 09/10/2017 Target Resolution Date: 11/24/2017 Goal Status: Active Interventions: [name], [name]  ([id number]) Assess patient/caregiver ability to obtain necessary supplies Assess patient/caregiver ability to perform ulcer/skin care regimen upon admission and as needed Assess ulceration(s) every visit Notes: Electronic Signature(s) Signed: 01/11/2018 4:58:29 PM By: Renne CriglerFlinchum, Cheryl Signed: 01/11/2018 5:21:02 PM By: Curtis Sitesorthy, Joanna Entered By: Curtis Sitesorthy, Joanna on 01/11/2018 10:02:55 [name], [name] ([id number]) -------------------------------------------------------------------------------- Pain Assessment Details Patient Name: Jessica CaseyLEATH, Libby R. Date of Service: 01/11/2018 9:00 AM Medical Record Number: [id number] Patient Account Number: [id number] Date of Birth/Sex: 03-23-1981 (37 y.o. Female) Treating RN: Renne CriglerFlinchum, Cheryl Primary Care Yovan Leeman: PATIENT, NO Other Clinician: Referring Shifa Brisbon: Referral, Self Treating Jaquel Glassburn/Extender: STONE III, HOYT Weeks in Treatment: 17 Active Problems Location of Pain Severity and Description of Pain Patient Has Paino No Site Locations Pain Management and Medication Current Pain Management: Electronic Signature(s) Signed: 01/11/2018 4:58:29 PM By: Renne CriglerFlinchum, Cheryl Entered By: Renne CriglerFlinchum, Cheryl on 01/11/2018 09:32:02 [name], [name] ([id number]) -------------------------------------------------------------------------------- Patient/Caregiver Education Details Patient Name: Jessica CaseyLEATH, Graziella R. Date of Service: 01/11/2018 9:00 AM Medical Record Number: [id number] Patient Account Number: [id number] Date of Birth/Gender: 03-23-1981 (37 y.o. Female) Treating RN: Ashok CordiaPinkerton, Debi Primary Care Physician: PATIENT, NO Other Clinician: Referring Physician: Referral, Self Treating Physician/Extender: Linwood DibblesSTONE III, HOYT Weeks in Treatment: 17 Education Assessment Education Provided To: Patient Education Topics Provided Wound/Skin Impairment: Handouts: Caring for Your Ulcer, Other: change dressing as ordered Methods: Demonstration, Explain/Verbal Responses: State  content correctly Electronic Signature(s) Signed:  01/16/2018 4:30:35 PM By: [name] Entered By: [name] on 01/11/2018 10:16:50 [name], [name] ([id number]) -------------------------------------------------------------------------------- Wound Assessment Details Patient Name: Jessica Casey R. Date of Service: 01/11/2018 9:00 AM Medical Record Number: [id number] Patient Account Number: [id number] Date of Birth/Sex: June 07, 1981 (37 y.o. Female) Treating RN: Renne Crigler Primary Care Aijalon Demuro: PATIENT, NO Other Clinician: Referring Nathyn Luiz: Referral, Self Treating Jaime Dome/Extender: STONE III, HOYT Weeks in Treatment: 17 Wound Status Wound Number: 2 Primary Etiology: Lymphedema Wound Location: Right Lower Leg - Lateral, Distal Wound Status: Open Wounding Event: Gradually Appeared Comorbid History: Lymphedema, Hypertension Date Acquired: 08/08/2017 Weeks Of Treatment: 17 Clustered Wound: Yes Photos Photo Uploaded By: Renne Crigler on 01/11/2018 16:29:48 Wound Measurements Length: (cm) 3.1 Width: (cm) 3.8 Depth: (cm) 0.2 Clustered Quantity: 2 Area: (cm) 9.252 Volume: (cm) 1.85 % Reduction in Area: 56.8% % Reduction in Volume: 71.2% Epithelialization: Small (1-33%) Tunneling: No Undermining: No Wound Description Full Thickness With Exposed Support Classification: Structures Wound Margin: Flat and Intact Exudate Large Amount: Exudate Type: Serosanguineous Exudate Color: red, brown Foul Odor After Cleansing: No Slough/Fibrino Yes Wound Bed Granulation Amount: Large (67-100%) Exposed Structure Granulation Quality: Red Fascia Exposed: No Necrotic Amount: Small (1-33%) Fat Layer (Subcutaneous Tissue) Exposed: Yes Necrotic Quality: Adherent Slough Tendon Exposed: No Muscle Exposed: No Joint Exposed: No [name], [name] ([id number]) Bone Exposed: No Periwound Skin Texture Texture Color No Abnormalities Noted: No No Abnormalities Noted:  Yes Callus: No Temperature / Pain Crepitus: No Temperature: No Abnormality Excoriation: No Tenderness on Palpation: Yes Induration: No Rash: No Scarring: Yes Moisture No Abnormalities Noted: No Dry / Scaly: No Maceration: No Wound Preparation Ulcer Cleansing: Rinsed/Irrigated with Saline, Other: soap and water, Topical Anesthetic Applied: Other: lidocaine 4%, Treatment Notes Wound #2 (Right, Distal, Lateral Lower Leg) 1. Cleansed with: Clean wound with Normal Saline Cleanse wound with antibacterial soap and water 2. Anesthetic Topical Lidocaine 4% cream to wound bed prior to debridement 3. Peri-wound Care: Barrier cream Moisturizing lotion 4. Dressing Applied: Prisma Ag 5. Secondary Dressing Applied ABD Pad 7. Secured with 4-Layer Compression System - Right Lower Extremity Notes xtrasorb, charcoal, unna to anchor Electronic Signature(s) Signed: 01/11/2018 4:58:29 PM By: Renne Crigler Entered By: Renne Crigler on 01/11/2018 09:44:41 [name], [name] ([id number]) -------------------------------------------------------------------------------- Vitals Details Patient Name: Jessica Casey R. Date of Service: 01/11/2018 9:00 AM Medical Record Number: [id number] Patient Account Number: [id number] Date of Birth/Sex: 1981/07/14 (37 y.o. Female) Treating RN: Renne Crigler Primary Care Asia Dusenbury: PATIENT, NO Other Clinician: Referring Saulo Anthis: Referral, Self Treating Sayward Horvath/Extender: STONE III, HOYT Weeks in Treatment: 17 Vital Signs Time Taken: 09:33 Temperature (F): 98.0 Height (in): 74 Pulse (bpm): 88 Weight (lbs): 505 Respiratory Rate (breaths/min): 18 Body Mass Index (BMI): 64.8 Blood Pressure (mmHg): 128/75 Reference Range: 80 - 120 mg / dl Electronic Signature(s) Signed: 01/11/2018 4:58:29 PM By: Renne Crigler Entered By: Renne Crigler on 01/11/2018 09:33:54

## 2018-01-18 ENCOUNTER — Ambulatory Visit: Payer: Self-pay | Admitting: Physician Assistant

## 2018-01-22 ENCOUNTER — Encounter: Payer: Self-pay | Attending: Physician Assistant | Admitting: Physician Assistant

## 2018-01-22 DIAGNOSIS — L97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d: Secondary | ICD-10-CM | POA: Insufficient documentation

## 2018-01-22 DIAGNOSIS — I10 Essential (primary) hypertension: Secondary | ICD-10-CM | POA: Insufficient documentation

## 2018-01-22 DIAGNOSIS — Z6841 Body Mass Index (BMI) 40.0 and over, adult: Secondary | ICD-10-CM | POA: Insufficient documentation

## 2018-01-22 DIAGNOSIS — I872 Venous insufficiency (chronic) (peripheral): Secondary | ICD-10-CM | POA: Insufficient documentation

## 2018-01-22 DIAGNOSIS — I890 Lymphedema, not elsewhere classified: Secondary | ICD-10-CM | POA: Insufficient documentation

## 2018-01-24 NOTE — Progress Notes (Signed)
[name], [name] ([id number]) Visit Report for 01/22/2018 Chief Complaint Document Details Patient Name: ECHO, PROPP. Date of Service: 01/22/2018 2:00 PM Medical Record Number: [id number] Patient Account Number: [id number] Date of Birth/Sex: 07/20/1981 (37 y.o. Female) Treating RN: Ashok Cordia, Debi Primary Care Provider: PATIENT, NO Other Clinician: Referring Provider: Referral, Self Treating Provider/Extender: STONE III, HOYT Weeks in Treatment: 19 Information Obtained from: Patient Chief Complaint She is here in follow up evaluation of right lower extremity ulcers Electronic Signature(s) Signed: 01/22/2018 7:21:18 PM By: Lenda Kelp PA-C Entered By: Lenda Kelp on 01/22/2018 14:30:06 [name], [name] ([id number]) -------------------------------------------------------------------------------- Debridement Details Patient Name: Jessica Casey R. Date of Service: 01/22/2018 2:00 PM Medical Record Number: [id number] Patient Account Number: [id number] Date of Birth/Sex: 09-28-1981 (37 y.o. Female) Treating RN: Ashok Cordia, Debi Primary Care Provider: PATIENT, NO Other Clinician: Referring Provider: Referral, Self Treating Provider/Extender: STONE III, HOYT Weeks in Treatment: 19 Debridement Performed for Wound #2 Right,Distal,Lateral Lower Leg Assessment: Performed By: Physician STONE III, HOYT E., PA-C Debridement: Debridement Pre-procedure Verification/Time Yes - 14:31 Out Taken: Start Time: 14:32 Pain Control: Lidocaine 4% Topical Solution Level: Skin/Subcutaneous Tissue Total Area Debrided (L x W): 2.1 (cm) x 3.8 (cm) = 7.98 (cm) Tissue and other material Viable, Non-Viable, Exudate, Fibrin/Slough, Subcutaneous debrided: Instrument: Curette Bleeding: Minimum Hemostasis Achieved: Pressure End Time: 14:37 Procedural Pain: 0 Post Procedural Pain: 0 Response to Treatment: Procedure was tolerated well Post Debridement Measurements of Total Wound Length: (cm) 2.1 Width: (cm)  3.8 Depth: (cm) 0.2 Volume: (cm) 1.253 Character of Wound/Ulcer Post Debridement: Requires Further Debridement Post Procedure Diagnosis Same as Pre-procedure Electronic Signature(s) Signed: 01/22/2018 7:21:18 PM By: Lenda Kelp PA-C Signed: 01/23/2018 4:15:20 PM By: [name] Entered By: [name] on 01/22/2018 14:37:32 [name], [name] ([id number]) -------------------------------------------------------------------------------- HPI Details Patient Name: Jessica Casey R. Date of Service: 01/22/2018 2:00 PM Medical Record Number: [id number] Patient Account Number: [id number] Date of Birth/Sex: Jun 21, 1981 (37 y.o. Female) Treating RN: Ashok Cordia, Debi Primary Care Provider: PATIENT, NO Other Clinician: Referring Provider: Referral, Self Treating Provider/Extender: STONE III, HOYT Weeks in Treatment: 19 History of Present Illness HPI Description: 37 year old patient well known to our [facility] wound care clinic where she has been seen since 2016 for bilateral lower extremity venous insufficiency disease with lymphedema and multiple ulcerations associated with morbid obesity. she had custom-made compression stockings and lymphedema pumps which were used in the past. most recently she was admitted to the hospital between October 11 and 09/02/2017 with sepsis, lower extremity wounds and lymphedema.she was initially treated in the outpatient with Keflex and Bactrim. she was initially treated in the hospital with vancomycin and Zosyn and changed over to Unasyn until her white count improved and her blood cultures were negative for 3 days. After her inpatient management she was discharged home on Augmentin to end on 09/13/2017 with a 14 day course. she has had outpatient vascular duplex scans completed in November 2017 and her right ABI was 1.1 and the left ABI is 1.3. she had normal toe brachial indices bilaterally.she had three-vessel runoff in the right lower extremity and  two-vessel runoff in the left lower extremity. On questioning the patient she does have custom made compression stockings and also has a lymphedema pump but has not been using it appropriately and has not been taking good care of herself. 09/17/2017 -- she returns today with compression stockings on the left side and the right side has had significant amount of drainage and has a very strong odor 09/24/2017 -- the  drainage is increased significantly and she has more lymphedema and a very strong odor to her wound. Though she does not have systemic symptoms, or overt infection I believe she will benefit from some doxycycline given empirically. 10/01/2017 -- after starting the doxycycline and changing the dressing twice a week her symptoms and signs have definitely improved overall. 10/08/2017 -- she has completed her course of doxycycline but continues to have a lot of drainage and needs twice a week dressing changes. 11/08/17-she is here in follow-up evaluation for right lower extremity ulcers. She admits to using her lymphedema pumps twice daily, one hour per session. she is voicing no complaints or concerns, no signs of infection will change to [facility] 12/14/17 on evaluation today patient appears to be doing very well in regard to her wounds. She has been tolerating the dressing changes she continues to develop some portly the adherent granular tissue on the surface of the wound with some Slough. Obviously we are trying to get too much better wound bed. With that being said the hyper granulation the Hydrofera Blue Dressing to have helped with which is excellent news. However I think it may be time to try something a little bit different at this point. 01/11/18 on evaluation today patient appears to be doing fairly well in regard to her right lateral lower extremity ulcers. This shows excellent signs of filling in which is great news. There does not appear to be any evidence of infection which is  also good news. She does continue to work as well is good school. She is having no pain. 01/22/18 on evaluation today patient appears to be doing a little bit worse in my opinion in regard to the overall quality of that granulation on her right lower extremity. She was not here last week due to being sick with a stomach virus this may have something to do with the fact that her wound appears to be a little bit worse. With that being said I'm also thinking that after switching from the [facility] Dressing to the silver collagen would really has not looked that's good in my opinion. We may want to switch back. NAKEYIA, MENDEN ([id number]) Electronic Signature(s) Signed: 01/22/2018 7:21:18 PM By: Lenda Kelp PA-C Entered By: Lenda Kelp on 01/22/2018 19:18:55 [name], [name] Kitchen ([id number]) -------------------------------------------------------------------------------- Physical Exam Details Patient Name: Jessica Casey R. Date of Service: 01/22/2018 2:00 PM Medical Record Number: [id number] Patient Account Number: [id number] Date of Birth/Sex: 12-23-1980 (37 y.o. Female) Treating RN: Ashok Cordia, Debi Primary Care Provider: PATIENT, NO Other Clinician: Referring Provider: Referral, Self Treating Provider/Extender: STONE III, HOYT Weeks in Treatment: 19 Constitutional Obese and well-hydrated in no acute distress. Respiratory normal breathing without difficulty. Psychiatric this patient is able to make decisions and demonstrates good insight into disease process. Alert and Oriented x 3. pleasant and cooperative. Notes Upon inspection today patient's wound did require sharp debridement which was performed without complication she did have some discomfort but this was minimal to out of 10 and very brief. The majority of the debridement was tolerated well. The wound did appear to be a bit deeper post debridement than prior. Nonetheless it did appear to be of better quality. Electronic  Signature(s) Signed: 01/22/2018 7:21:18 PM By: Lenda Kelp PA-C Entered By: Lenda Kelp on 01/22/2018 19:19:44 [name], [name] ([id number]) -------------------------------------------------------------------------------- Physician Orders Details Patient Name: Jessica Casey R. Date of Service: 01/22/2018 2:00 PM Medical Record Number: [id number] Patient Account Number: [id number] Date of Birth/Sex: [birth year] (36  y.o. Female) Treating RN: Ashok Cordia, Debi Primary Care Provider: PATIENT, NO Other Clinician: Referring Provider: Referral, Self Treating Provider/Extender: STONE III, HOYT Weeks in Treatment: 19 Verbal / Phone Orders: Yes Clinician: Pinkerton, Debi Read Back and Verified: Yes Diagnosis Coding ICD-10 Coding Code Description L97.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d I89.0 Lymphedema, not elsewhere classified I87.311 Chronic venous hypertension (idiopathic) with ulcer of right lower extremity E66.01 Morbid (severe) obesity due to excess calories Wound Cleansing Wound #2 Right,Distal,Lateral Lower Leg o Clean wound with Normal Saline. o Cleanse wound with mild soap and water Anesthetic (add to Medication List) Wound #2 Right,Distal,Lateral Lower Leg o Topical Lidocaine 4% cream applied to wound bed prior to debridement (In Clinic Only). Skin Barriers/Peri-Wound Care Wound #2 Right,Distal,Lateral Lower Leg o Barrier cream o Moisturizing lotion Primary Wound Dressing Wound #2 Right,Distal,Lateral Lower Leg o Hydrafera Blue Secondary Dressing Wound #2 Right,Distal,Lateral Lower Leg o ABD pad o XtraSorb o Other - charcoal Dressing Change Frequency Wound #2 Right,Distal,Lateral Lower Leg o Change dressing every week Follow-up Appointments o Return Appointment in 1 week. o Nurse Visit as needed Edema Control Wound #2 Right,Distal,Lateral Lower Leg [name], [name] ([id number]) o 4-Layer Compression System -  Right Lower Extremity - unna to anchor o Elevate legs to the level of the heart and pump ankles as often as possible Additional Orders / Instructions Wound #2 Right,Distal,Lateral Lower Leg o Vitamin A; Vitamin C, Zinc o Increase protein intake. o Other: - Please watch your salt (sodium) intake Patient Medications Allergies: No Known Allergies Notifications Medication Indication Start End lidocaine DOSE 1 - topical 4 % cream - 1 cream topical Electronic Signature(s) Signed: 01/22/2018 7:21:18 PM By: Lenda Kelp PA-C Signed: 01/23/2018 4:15:20 PM By: [name] Entered By: [name] on 01/22/2018 14:38:44 [name], [name] ([id number]) -------------------------------------------------------------------------------- Prescription 01/22/2018 Patient Name: Jessica Casey R. Provider: Lenda Kelp PA-C Date of Birth: 1981/04/03 NPI#: [id number] Sex: F DEA#: [id number] Phone #: [phone] License #: Patient Address: [facility] [address] [facility] [place], [place] [address] [address] [place], [place] [address] [phone] Allergies No Known Allergies Medication Medication: Route: Strength: Form: lidocaine topical 4% cream Class: TOPICAL LOCAL ANESTHETICS Dose: Frequency / Time: Indication: 1 1 cream topical Number of Refills: Number of Units: 0 Generic Substitution: Start Date: End Date: Administered at Substitution Permitted Facility: Yes Time Administered: Time Discontinued: Note to Pharmacy: Signature(s): Date(s): Electronic Signature(s) Signed: 01/22/2018 7:21:18 PM By: Lenda Kelp PA-C Signed: 01/23/2018 4:15:20 PM By: [name] Entered By: [name] on 01/22/2018 14:38:46 [name], [name] ([id number]) [name], [name] ([id number]) --------------------------------------------------------------------------------  Problem List Details Patient Name: Jessica Casey R. Date of Service: 01/22/2018 2:00 PM Medical Record Number: [id number] Patient Account Number: [id number] Date of Birth/Sex: Aug 25, 1981 (37 y.o. Female) Treating RN: Ashok Cordia, Debi Primary Care Provider: PATIENT, NO Other Clinician: Referring Provider: Referral, Self Treating Provider/Extender: STONE III, HOYT Weeks in Treatment: 19 Active Problems ICD-10 Encounter Code Description Active Date Diagnosis L97.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d 09/10/2017 Yes I89.0 Lymphedema, not elsewhere classified 09/10/2017 Yes I87.311 Chronic venous hypertension (idiopathic) with ulcer of right lower 09/10/2017 Yes extremity E66.01 Morbid (severe) obesity due to excess calories 09/10/2017 Yes Inactive Problems Resolved Problems Electronic Signature(s) Signed: 01/22/2018 7:21:18 PM By: Lenda Kelp PA-C Entered By: Lenda Kelp on 01/22/2018 14:29:19 [name], [name] ([id number]) -------------------------------------------------------------------------------- Progress Note Details Patient Name: Jessica Casey R.  Date of Service: 01/22/2018 2:00 PM Medical Record Number: [id number] Patient Account Number: [id number] Date of Birth/Sex: [birth year] (37 y.o. Female) Treating RN: Ashok Cordia, Debi Primary Care Provider: PATIENT, NO Other Clinician: Referring Provider: Referral, Self Treating Provider/Extender: STONE III, HOYT Weeks in Treatment: 19 Subjective Chief Complaint Information obtained from Patient She is here in follow up evaluation of right lower extremity ulcers History of Present Illness (HPI) 38 year old patient well known to our [facility] wound care clinic where she has been seen since 2016 for bilateral lower extremity venous insufficiency disease with lymphedema and multiple ulcerations associated with morbid obesity. she had custom-made compression stockings and lymphedema pumps which were used in the past. most recently she was admitted to  the hospital between October 11 and 09/02/2017 with sepsis, lower extremity wounds and lymphedema.she was initially treated in the outpatient with Keflex and Bactrim. she was initially treated in the hospital with vancomycin and Zosyn and changed over to Unasyn until her white count improved and her blood cultures were negative for 3 days. After her inpatient management she was discharged home on Augmentin to end on 09/13/2017 with a 14 day course. she has had outpatient vascular duplex scans completed in November 2017 and her right ABI was 1.1 and the left ABI is 1.3. she had normal toe brachial indices bilaterally.she had three-vessel runoff in the right lower extremity and two-vessel runoff in the left lower extremity. On questioning the patient she does have custom made compression stockings and also has a lymphedema pump but has not been using it appropriately and has not been taking good care of herself. 09/17/2017 -- she returns today with compression stockings on the left side and the right side has had significant amount of drainage and has a very strong odor 09/24/2017 -- the drainage is increased significantly and she has more lymphedema and a very strong odor to her wound. Though she does not have systemic symptoms, or overt infection I believe she will benefit from some doxycycline given empirically. 10/01/2017 -- after starting the doxycycline and changing the dressing twice a week her symptoms and signs have definitely improved overall. 10/08/2017 -- she has completed her course of doxycycline but continues to have a lot of drainage and needs twice a week dressing changes. 11/08/17-she is here in follow-up evaluation for right lower extremity ulcers. She admits to using her lymphedema pumps twice daily, one hour per session. she is voicing no complaints or concerns, no signs of infection will change to [facility] 12/14/17 on evaluation today patient appears to be doing very well in  regard to her wounds. She has been tolerating the dressing changes she continues to develop some portly the adherent granular tissue on the surface of the wound with some Slough. Obviously we are trying to get too much better wound bed. With that being said the hyper granulation the Hydrofera Blue Dressing to have helped with which is excellent news. However I think it may be time to try something a little bit different at this point. 01/11/18 on evaluation today patient appears to be doing fairly well in regard to her right lateral lower extremity ulcers. This shows excellent signs of filling in which is great news. There does not appear to be any evidence of infection which is also good news. She does continue to work as well is good school. She is having no pain. 01/22/18 on evaluation today patient appears to be doing a little bit worse in my opinion in regard to the overall quality  of that [name], Jesyka R. ([id number]) granulation on her right lower extremity. She was not here last week due to being sick with a stomach virus this may have something to do with the fact that her wound appears to be a little bit worse. With that being said I'm also thinking that [facility] the silver collagen would really has not looked that's good in my opinion. We may want to switch back. Patient History Information obtained from Patient. Family History Kidney Disease - Mother, No family history of Cancer, Diabetes, Heart Disease, Hereditary Spherocytosis, Hypertension, Lung Disease, Seizures, Stroke, Thyroid Problems, Tuberculosis. Social History Never smoker, Marital Status - Married, Alcohol Use - Never, Drug Use - No History, Caffeine Use - Daily. Review of Systems (ROS) Constitutional Symptoms (General Health) Denies complaints or symptoms of Fever, Chills. Respiratory The patient has no complaints or symptoms. Cardiovascular Complains or has symptoms of LE  edema. Psychiatric The patient has no complaints or symptoms. Objective Constitutional Obese and well-hydrated in no acute distress. Vitals Time Taken: 2:07 PM, Height: 74 in, Weight: 505 lbs, BMI: 64.8, Temperature: 98.3 F, Pulse: 97 bpm, Respiratory Rate: 18 breaths/min, Blood Pressure: 134/83 mmHg. Respiratory normal breathing without difficulty. Psychiatric this patient is able to make decisions and demonstrates good insight into disease process. Alert and Oriented x 3. pleasant and cooperative. General Notes: Upon inspection today patient's wound did require sharp debridement which was performed without complication she did have some discomfort but this was minimal to out of 10 and very brief. The majority of the debridement was tolerated well. The wound did appear to be a bit deeper post debridement than prior. Nonetheless it did appear to be of better quality. Integumentary (Hair, Skin) Wound #2 status is Open. Original cause of wound was Gradually Appeared. The wound is located on the Right,Distal,Lateral Aumiller, Rosa R. ([id number]) Lower Leg. The wound measures 2.1cm length x 3.8cm width x 0.1cm depth; 6.267cm^2 area and 0.627cm^3 volume. There is Fat Layer (Subcutaneous Tissue) Exposed exposed. There is no tunneling or undermining noted. There is a large amount of serosanguineous drainage noted. The wound margin is flat and intact. There is large (67-100%) red granulation within the wound bed. There is a small (1-33%) amount of necrotic tissue within the wound bed including Adherent Slough. The periwound skin appearance had no abnormalities noted for color. The periwound skin appearance exhibited: Scarring. The periwound skin appearance did not exhibit: Callus, Crepitus, Excoriation, Induration, Rash, Dry/Scaly, Maceration. Periwound temperature was noted as No Abnormality. The periwound has tenderness on palpation. Assessment Active Problems ICD-10 L97.212 - Non-pressure  chronic ulcer of right calf with fat layer exposed I89.0 - Lymphedema, not elsewhere classified I87.311 - Chronic venous hypertension (idiopathic) with ulcer of right lower extremity E66.01 - Morbid (severe) obesity due to excess calories Procedures Wound #2 Pre-procedure diagnosis of Wound #2 is a Lymphedema located on the Right,Distal,Lateral Lower Leg . There was a Skin/Subcutaneous Tissue Debridement ([id number]) debridement with total area of 7.98 sq cm performed by STONE III, HOYT E., PA-C. with the following instrument(s): Curette to remove Viable and Non-Viable tissue/material including Exudate, Fibrin/Slough, and Subcutaneous after achieving pain control using Lidocaine 4% Topical Solution. A time out was conducted at 14:31, prior to the start of the procedure. A Minimum amount of bleeding was controlled with Pressure. The procedure was tolerated well with a pain level of 0 throughout and a pain level of 0 following the procedure. Post Debridement Measurements: 2.1cm length x  3.8cm width x 0.2cm depth; 1.253cm^3 volume. Character of Wound/Ulcer Post Debridement requires further debridement. Post procedure Diagnosis Wound #2: Same as Pre-Procedure Plan Wound Cleansing: Wound #2 Right,Distal,Lateral Lower Leg: Clean wound with Normal Saline. Cleanse wound with mild soap and water Anesthetic (add to Medication List): Wound #2 Right,Distal,Lateral Lower Leg: Topical Lidocaine 4% cream applied to wound bed prior to debridement (In Clinic Only). Skin Barriers/Peri-Wound Care: Wound #2 Right,Distal,Lateral Lower Leg: Barrier cream Moisturizing lotion Primary Wound Dressing: Poli, Carnelia R. ([id number]) Wound #2 Right,Distal,Lateral Lower Leg: Hydrafera Blue Secondary Dressing: Wound #2 Right,Distal,Lateral Lower Leg: ABD pad XtraSorb Other - charcoal Dressing Change Frequency: Wound #2 Right,Distal,Lateral Lower Leg: Change dressing every week Follow-up Appointments: Return  Appointment in 1 week. Nurse Visit as needed Edema Control: Wound #2 Right,Distal,Lateral Lower Leg: 4-Layer Compression System - Right Lower Extremity - unna to anchor Elevate legs to the level of the heart and pump ankles as often as possible Additional Orders / Instructions: Wound #2 Right,Distal,Lateral Lower Leg: Vitamin A; Vitamin C, Zinc Increase protein intake. Other: - Please watch your salt (sodium) intake The following medication(s) was prescribed: lidocaine topical 4 % cream 1 1 cream topical was prescribed at facility I am going to suggest at this point that we switch back to the [facility] Dressing as I feel like this did better for the patient in the past. She's in agreement with plan. We will see were things stand following. Continue with the four layer compression wrap. Please see above for specific wound care orders. We will see patient for re-evaluation in 1 week(s) here in the clinic. If anything worsens or changes patient will contact our office for additional recommendations. Electronic Signature(s) Signed: 01/22/2018 7:21:18 PM By: Lenda Kelp PA-C Entered By: Lenda Kelp on 01/22/2018 19:20:25 [name] ([id number]) -------------------------------------------------------------------------------- ROS/PFSH Details Patient Name: Jessica Casey R. Date of Service: 01/22/2018 2:00 PM Medical Record Number: [id number] Patient Account Number: [id number] Date of Birth/Sex: 02/08/1981 (37 y.o. Female) Treating RN: Ashok Cordia, Debi Primary Care Provider: PATIENT, NO Other Clinician: Referring Provider: Referral, Self Treating Provider/Extender: STONE III, HOYT Weeks in Treatment: 19 Information Obtained From Patient Wound History Do you currently have one or more open woundso Yes How many open wounds do you currently haveo 1 Approximately how long have you had your woundso 1 month How have you been treating your wound(s) until nowo aquacel ag Has your  wound(s) ever healed and then re-openedo No Have you had any lab work done in the past montho No Have you tested positive for an antibiotic resistant organism (MRSA, VRE)o No Have you tested positive for osteomyelitis (bone infection)o No Have you had any tests for circulation on your legso Yes Who ordered the testo [facility] Where was the test doneo [facility] Constitutional Symptoms (General Health) Complaints and Symptoms: Negative for: Fever; Chills Cardiovascular Complaints and Symptoms: Positive for: LE edema Medical History: Positive for: Hypertension Negative for: Angina; Arrhythmia; Congestive Heart Failure; Coronary Artery Disease; Deep Vein Thrombosis; Hypotension; Myocardial Infarction; Peripheral Arterial Disease; Peripheral Venous Disease; Phlebitis; Vasculitis Hematologic/Lymphatic Medical History: Positive for: Lymphedema Respiratory Complaints and Symptoms: No Complaints or Symptoms Medical History: Negative for: Aspiration; Asthma; Chronic Obstructive Pulmonary Disease (COPD); Pneumothorax; Sleep Apnea; Tuberculosis Psychiatric Complaints and Symptoms: No Complaints or Symptoms Depaulo, Daesha R. ([id number]) Immunizations Pneumococcal Vaccine: Received Pneumococcal Vaccination: No Immunization Notes: up to date Implantable Devices Family and Social History Cancer: No; Diabetes: No; Heart Disease: No; Hereditary Spherocytosis: No; Hypertension: No; Kidney Disease: Yes -  Mother; Lung Disease: No; Seizures: No; Stroke: No; Thyroid Problems: No; Tuberculosis: No; Never smoker; Marital Status - Married; Alcohol Use: Never; Drug Use: No History; Caffeine Use: Daily; Financial Concerns: No; Food, Clothing or Shelter Needs: No; Support System Lacking: No; Transportation Concerns: No; Advanced Directives: No; Patient does not want information on Advanced Directives Physician Affirmation I have reviewed and agree with the above information. Electronic Signature(s) Signed:  01/22/2018 7:21:18 PM By: Lenda Kelp PA-C Signed: 01/23/2018 4:15:20 PM By: [name] Entered By: Lenda Kelp on 01/22/2018 19:19:17 Osterberg, Nickolette R. ([id number]) -------------------------------------------------------------------------------- SuperBill Details Patient Name: Jessica Casey R. Date of Service: 01/22/2018 Medical Record Number: [id number] Patient Account Number: [id number] Date of Birth/Sex: [birth year] (37 y.o. 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 hypertension (idiopathic) with ulcer of right lower extremity E66.01 Morbid (severe) obesity due to excess calories Facility Procedures CPT4 Code: 78295621 Description: 11042 - DEB SUBQ TISSUE 20 SQ CM/< ICD-10 Diagnosis Description L97.212 Non-pressure chronic ulcer of right calf with fat layer expo Modifier: sed Quantity: 1 Physician Procedures CPT4 Code: [id number] Description: 11042 - WC PHYS SUBQ TISS 20 SQ CM ICD-10 Diagnosis Description L97.212 Non-pressure chronic ulcer of right calf with fat layer expo Modifier: sed Quantity: 1 Electronic Signature(s) Signed: 01/22/2018 7:21:18 PM By: Lenda Kelp PA-C Entered By: Lenda Kelp on 01/22/2018 19:20:38

## 2018-01-24 NOTE — Progress Notes (Signed)
KAILEN, NAME ([id number]) Visit Report for 01/22/2018 Arrival Information Details Patient Name: Jessica, [name]. Date of Service: 01/22/2018 2:00 PM Medical Record Number: [id number] Patient Account Number: [id number] Date of Birth/Sex: 08/16/1981 (37 y.o. Female) Treating RN: Curtis Sites Primary Care Camielle Sizer: PATIENT, NO Other Clinician: Referring Zaria Taha: Referral, Self Treating Eames Dibiasio/Extender: STONE III, HOYT Weeks in Treatment: 19 Visit Information History Since Last Visit Added or deleted any medications: No Patient Arrived: Ambulatory Any new allergies or adverse reactions: No Arrival Time: 14:06 Had a fall or experienced change in No Accompanied By: self activities of daily living that may affect Transfer Assistance: None risk of falls: Patient Identification Verified: Yes Signs or symptoms of abuse/neglect since last visito No Secondary Verification Process Completed: Yes Hospitalized since last visit: No Patient Requires Transmission-Based No Has Dressing in Place as Prescribed: Yes Precautions: Has Compression in Place as Prescribed: Yes Patient Has Alerts: No Pain Present Now: No Electronic Signature(s) Signed: 01/23/2018 4:38:42 PM By: Curtis Sites Entered By: Curtis Sites on 01/22/2018 14:06:57 [name], [name] ([id number]) -------------------------------------------------------------------------------- Encounter Discharge Information Details Patient Name: Jessica Casey R. Date of Service: 01/22/2018 2:00 PM Medical Record Number: [id number] Patient Account Number: [id number] Date of Birth/Sex: 09-28-1981 (37 y.o. Female) Treating RN: Renne Crigler Primary Care Jamina Macbeth: PATIENT, NO Other Clinician: Referring Annalea Alguire: Referral, Self Treating Baylor Cortez/Extender: STONE III, HOYT Weeks in Treatment: 19 Encounter Discharge Information Items Discharge Pain Level: 0 Discharge Condition: Stable Ambulatory Status: Ambulatory Discharge Destination:  Home Private Transportation: Auto Accompanied By: self Schedule Follow-up Appointment: Yes Medication Reconciliation completed and provided No to Patient/Care Doninique Lwin: Clinical Summary of Care: Electronic Signature(s) Signed: 01/23/2018 4:15:18 PM By: Renne Crigler Entered By: Renne Crigler on 01/22/2018 15:01:47 [name], [name] ([id number]) -------------------------------------------------------------------------------- Lower Extremity Assessment Details Patient Name: Jessica Casey R. Date of Service: 01/22/2018 2:00 PM Medical Record Number: [id number] Patient Account Number: [id number] Date of Birth/Sex: 10-May-1981 (37 y.o. Female) Treating RN: Curtis Sites Primary Care Mariaisabel Bodiford: PATIENT, NO Other Clinician: Referring Melady Chow: Referral, Self Treating Kylo Gavin/Extender: STONE III, HOYT Weeks in Treatment: 19 Edema Assessment Assessed: [Left: No] [Right: No] [Left: Edema] [Right: :] Calf Left: Right: Point of Measurement: 35 cm From Medial Instep cm 58.5 cm Ankle Left: Right: Point of Measurement: 9 cm From Medial Instep cm 37 cm Vascular Assessment Pulses: Dorsalis Pedis Palpable: [Right:Yes] Posterior Tibial Extremity colors, hair growth, and conditions: Extremity Color: [Right:Hyperpigmented] Hair Growth on Extremity: [Right:Yes] Temperature of Extremity: [Right:Warm] Capillary Refill: [Right:< 3 seconds] Toe Nail Assessment Left: Right: Thick: Yes Discolored: Yes Deformed: Yes Improper Length and Hygiene: Yes Electronic Signature(s) Signed: 01/23/2018 4:38:42 PM By: Curtis Sites Entered By: Curtis Sites on 01/22/2018 14:13:40 [name], [name] ([id number]) -------------------------------------------------------------------------------- Multi Wound Chart Details Patient Name: Jessica Casey R. Date of Service: 01/22/2018 2:00 PM Medical Record Number: [id number] Patient Account Number: [id number] Date of Birth/Sex: 1980-12-19 (37 y.o. Female) Treating RN:  Ashok Cordia, Debi Primary Care Lyncoln Maskell: PATIENT, NO Other Clinician: Referring Dariel Pellecchia: Referral, Self Treating Mahogany Torrance/Extender: STONE III, HOYT Weeks in Treatment: 19 Vital Signs Height(in): 74 Pulse(bpm): 97 Weight(lbs): 505 Blood Pressure(mmHg): 134/83 Body Mass Index(BMI): 65 Temperature(F): 98.3 Respiratory Rate 18 (breaths/min): Photos: [2:No Photos] [N/A:N/A] Wound Location: [2:Right Lower Leg - Lateral, Distal] [N/A:N/A] Wounding Event: [2:Gradually Appeared] [N/A:N/A] Primary Etiology: [2:Lymphedema] [N/A:N/A] Comorbid History: [2:Lymphedema, Hypertension] [N/A:N/A] Date Acquired: [2:08/08/2017] [N/A:N/A] Weeks of Treatment: [2:19] [N/A:N/A] Wound Status: [2:Open] [N/A:N/A] Clustered Wound: [2:Yes] [N/A:N/A] Clustered Quantity: [2:2] [N/A:N/A] Measurements L x W x D [2:2.1x3.8x0.1] [N/A:N/A] (cm) Area (cm) : [2:6.267] [N/A:N/A] Volume (cm) : [  2:0.627] [N/A:N/A] % Reduction in Area: [2:70.80%] [N/A:N/A] % Reduction in Volume: [2:90.30%] [N/A:N/A] Classification: [2:Full Thickness With Exposed Support Structures] [N/A:N/A] Exudate Amount: [2:Large] [N/A:N/A] Exudate Type: [2:Serosanguineous] [N/A:N/A] Exudate Color: [2:red, brown] [N/A:N/A] Wound Margin: [2:Flat and Intact] [N/A:N/A] Granulation Amount: [2:Large (67-100%)] [N/A:N/A] Granulation Quality: [2:Red] [N/A:N/A] Necrotic Amount: [2:Small (1-33%)] [N/A:N/A] Exposed Structures: [2:Fat Layer (Subcutaneous Tissue) Exposed: Yes Fascia: No Tendon: No Muscle: No Joint: No Bone: No] [N/A:N/A] Epithelialization: [2:Small (1-33%)] [N/A:N/A] Periwound Skin Texture: [2:Scarring: Yes Excoriation: No Induration: No] [N/A:N/A] Callus: No Crepitus: No Rash: No Periwound Skin Moisture: Maceration: No N/A N/A Dry/Scaly: No Periwound Skin Color: Atrophie Blanche: No N/A N/A Cyanosis: No Ecchymosis: No Erythema: No Hemosiderin Staining: No Mottled: No Pallor: No Rubor: No Temperature: No Abnormality N/A  N/A Tenderness on Palpation: Yes N/A N/A Wound Preparation: Ulcer Cleansing: N/A N/A Rinsed/Irrigated with Saline, Other: soap and water Topical Anesthetic Applied: Other: lidocaine 4% Treatment Notes Electronic Signature(s) Signed: 01/23/2018 4:15:20 PM By: [name] Entered By: [name] on 01/22/2018 14:32:18 [name] ([id number]) -------------------------------------------------------------------------------- Multi-Disciplinary Care Plan Details Patient Name: Jessica Casey R. Date of Service: 01/22/2018 2:00 PM Medical Record Number: [id number] Patient Account Number: [id number] Date of Birth/Sex: [birth year] (37 y.o. Female) Treating RN: Ashok Cordia, Debi Primary Care Annaleia Pence: PATIENT, NO Other Clinician: Referring Petar Mucci: Referral, Self Treating Rashiya Lofland/Extender: STONE III, HOYT Weeks in Treatment: 19 Active Inactive ` Orientation to the Wound Care Program Nursing Diagnoses: Knowledge deficit related to the wound healing center program Goals: Patient/caregiver will verbalize understanding of the Wound Healing Center Program Date Initiated: 09/10/2017 Target Resolution Date: 11/23/2017 Goal Status: Active Interventions: Provide education on orientation to the wound center Notes: ` Venous Leg Ulcer Nursing Diagnoses: Knowledge deficit related to disease process and management Goals: Patient/caregiver will verbalize understanding of disease process and disease management Date Initiated: 09/10/2017 Target Resolution Date: 11/23/2017 Goal Status: Active Interventions: Assess peripheral edema status every visit. Notes: ` Wound/Skin Impairment Nursing Diagnoses: Impaired tissue integrity Goals: Ulcer/skin breakdown will heal within 14 weeks Date Initiated: 09/10/2017 Target Resolution Date: 11/24/2017 Goal Status: Active Interventions: YARELIN, REICHARDT ([id number]) Assess patient/caregiver ability to obtain necessary supplies Assess patient/caregiver  ability to perform ulcer/skin care regimen upon admission and as needed Assess ulceration(s) every visit Notes: Electronic Signature(s) Signed: 01/23/2018 4:15:20 PM By: [name] Entered By: [name] on 01/22/2018 14:32:04 [name], [name] ([id number]) -------------------------------------------------------------------------------- Pain Assessment Details Patient Name: Jessica Casey R. Date of Service: 01/22/2018 2:00 PM Medical Record Number: [id number] Patient Account Number: [id number] Date of Birth/Sex: Sep 08, 1981 (37 y.o. Female) Treating RN: Curtis Sites Primary Care Abrar Bilton: PATIENT, NO Other Clinician: Referring Zissel Biederman: Referral, Self Treating Madiha Bambrick/Extender: STONE III, HOYT Weeks in Treatment: 19 Active Problems Location of Pain Severity and Description of Pain Patient Has Paino No Site Locations Pain Management and Medication Current Pain Management: Notes Topical or injectable lidocaine is offered to patient for acute pain when surgical debridement is performed. If needed, Patient is instructed to use over the counter pain medication for the following 24-48 hours after debridement. Wound care MDs do not prescribed pain medications. Patient has chronic pain or uncontrolled pain. Patient has been instructed to make an appointment with their Primary Care Physician for pain management. Electronic Signature(s) Signed: 01/23/2018 4:38:42 PM By: Curtis Sites Entered By: Curtis Sites on 01/22/2018 14:07:05 [name] ([id number]) -------------------------------------------------------------------------------- Patient/Caregiver Education Details Patient Name: Jessica Casey R. Date of Service: 01/22/2018 2:00 PM Medical Record Number: [id number] Patient Account Number: [id number] Date of Birth/Gender: 06-10-1981 (37 y.o. Female) Treating  RN: Renne CriglerFlinchum, Cheryl Primary Care Physician: PATIENT, NO Other Clinician: Referring Physician: Referral, Self Treating  Physician/Extender: Linwood DibblesSTONE III, HOYT Weeks in Treatment: 4819 Education Assessment Education Provided To: Patient Education Topics Provided Wound Debridement: Handouts: Wound Debridement Methods: Explain/Verbal Responses: State content correctly Wound/Skin Impairment: Methods: Explain/Verbal Responses: State content correctly Electronic Signature(s) Signed: 01/23/2018 4:15:18 PM By: Renne CriglerFlinchum, Cheryl Entered By: Renne CriglerFlinchum, Cheryl on 01/22/2018 15:02:11 [name], [name] ([id number]) -------------------------------------------------------------------------------- Wound Assessment Details Patient Name: [name], [name] Date of Service: 01/22/2018 2:00 PM Medical Record Number: [id number] Patient Account Number: [id number] Date of Birth/Sex: December 23, 1980 (37 y.o. Female) Treating RN: Curtis Sitesorthy, Joanna Primary Care Emrey Thornley: PATIENT, NO Other Clinician: Referring Amairani Shuey: Referral, Self Treating Karry Barrilleaux/Extender: STONE III, HOYT Weeks in Treatment: 19 Wound Status Wound Number: 2 Primary Etiology: Lymphedema Wound Location: Right Lower Leg - Lateral, Distal Wound Status: Open Wounding Event: Gradually Appeared Comorbid History: Lymphedema, Hypertension Date Acquired: 08/08/2017 Weeks Of Treatment: 19 Clustered Wound: Yes Photos Photo Uploaded By: Curtis Sitesorthy, Joanna on 01/22/2018 14:33:38 Wound Measurements Length: (cm) 2.1 % Redu Width: (cm) 3.8 % Redu Depth: (cm) 0.1 Epithe Clustered Quantity: 2 Tunnel Area: (cm) 6.267 Under Volume: (cm) 0.627 ction in Area: 70.8% ction in Volume: 90.3% lialization: Small (1-33%) ing: No mining: No Wound Description Full Thickness With Exposed Support Classification: Structures Wound Margin: Flat and Intact Exudate Large Amount: Exudate Type: Serosanguineous Exudate Color: red, brown Foul Odor After Cleansing: No Slough/Fibrino Yes Wound Bed Granulation Amount: Large (67-100%) Exposed Structure Granulation Quality: Red Fascia Exposed:  No Necrotic Amount: Small (1-33%) Fat Layer (Subcutaneous Tissue) Exposed: Yes Necrotic Quality: Adherent Slough Tendon Exposed: No Muscle Exposed: No Joint Exposed: No [name], [name] ([id number]) Bone Exposed: No Periwound Skin Texture Texture Color No Abnormalities Noted: No No Abnormalities Noted: Yes Callus: No Temperature / Pain Crepitus: No Temperature: No Abnormality Excoriation: No Tenderness on Palpation: Yes Induration: No Rash: No Scarring: Yes Moisture No Abnormalities Noted: No Dry / Scaly: No Maceration: No Wound Preparation Ulcer Cleansing: Rinsed/Irrigated with Saline, Other: soap and water, Topical Anesthetic Applied: Other: lidocaine 4%, Treatment Notes Wound #2 (Right, Distal, Lateral Lower Leg) 1. Cleansed with: Clean wound with Normal Saline 2. Anesthetic Topical Lidocaine 4% cream to wound bed prior to debridement 3. Peri-wound Care: Barrier cream 4. Dressing Applied: Hydrafera Blue 5. Secondary Dressing Applied ABD Pad 7. Secured with 3 Layer Compression System - Right Lower Extremity Notes xtrasorb, charcoal, unna to Ecologist[employer] Electronic Signature(s) Signed: 01/23/2018 4:38:42 PM By: Curtis Sitesorthy, Joanna Entered By: Curtis Sitesorthy, Joanna on 01/22/2018 14:18:53 [name], [name] ([id number]) -------------------------------------------------------------------------------- Vitals Details Patient Name: [name], [name] R. Date of Service: 01/22/2018 2:00 PM Medical Record Number: [id number] Patient Account Number: [id number] Date of Birth/Sex: December 23, 1980 (37 y.o. Female) Treating RN: Curtis Sitesorthy, Joanna Primary Care Khan Chura: PATIENT, NO Other Clinician: Referring Estha Few: Referral, Self Treating Jessly Lebeck/Extender: STONE III, HOYT Weeks in Treatment: 19 Vital Signs Time Taken: 14:07 Temperature (F): 98.3 Height (in): 74 Pulse (bpm): 97 Weight (lbs): 505 Respiratory Rate (breaths/min): 18 Body Mass Index (BMI): 64.8 Blood Pressure (mmHg): 134/83 Reference  Range: 80 - 120 mg / dl Electronic Signature(s) Signed: 01/23/2018 4:38:42 PM By: Curtis Sitesorthy, Joanna Entered By: Curtis Sitesorthy, Joanna on 01/22/2018 14:08:13

## 2018-01-28 ENCOUNTER — Encounter: Payer: Self-pay | Admitting: Physician Assistant

## 2018-01-31 NOTE — Progress Notes (Signed)
DAWNELLE, WARMAN ([id number]) Visit Report for 01/28/2018 Chief Complaint Document Details Patient Name: [name], [name] Date of Service: 01/28/2018 9:15 AM Medical Record Number: [id number] Patient Account Number: [id number] Date of Birth/Sex: 12-16-1980 (37 y.o. Female) Treating RN: Renne Crigler Primary Care Provider: PATIENT, NO Other Clinician: Referring Provider: Referral, Self Treating Provider/Extender: STONE III, HOYT Weeks in Treatment: 20 Information Obtained from: Patient Chief Complaint She is here in follow up evaluation of right lower extremity ulcers Electronic Signature(s) Signed: 01/29/2018 8:16:06 AM By: Lenda Kelp PA-C Entered By: Lenda Kelp on 01/28/2018 09:55:20 [name], [name] ([id number]) -------------------------------------------------------------------------------- Debridement Details Patient Name: Jessica Casey R. Date of Service: 01/28/2018 9:15 AM Medical Record Number: [id number] Patient Account Number: [id number] Date of Birth/Sex: Jan 28, 1981 (37 y.o. Female) Treating RN: Renne Crigler Primary Care Provider: PATIENT, NO Other Clinician: Referring Provider: Referral, Self Treating Provider/Extender: STONE III, HOYT Weeks in Treatment: 20 Debridement Performed for Wound #2 Right,Distal,Lateral Lower Leg Assessment: Performed By: Physician STONE III, HOYT E., PA-C Debridement: Debridement Pre-procedure Verification/Time Yes - 09:59 Out Taken: Start Time: 09:59 Pain Control: Other : lidocaine 4% Level: Skin/Subcutaneous Tissue Total Area Debrided (L x W): 2.1 (cm) x 4.2 (cm) = 8.82 (cm) Tissue and other material Viable, Non-Viable, Fibrin/Slough, Subcutaneous debrided: Instrument: Curette Bleeding: Minimum Hemostasis Achieved: Pressure End Time: 10:00 Procedural Pain: 0 Post Procedural Pain: 0 Response to Treatment: Procedure was tolerated well Post Debridement Measurements of Total Wound Length: (cm) 2.1 Width: (cm) 4.2 Depth:  (cm) 0.1 Volume: (cm) 0.693 Character of Wound/Ulcer Post Debridement: Stable Post Procedure Diagnosis Same as Pre-procedure Electronic Signature(s) Signed: 01/29/2018 8:16:06 AM By: Lenda Kelp PA-C Signed: 01/30/2018 8:28:46 AM By: Renne Crigler Entered By: Renne Crigler on 01/28/2018 10:00:51 [name], [name] ([id number]) -------------------------------------------------------------------------------- HPI Details Patient Name: Jessica Casey R. Date of Service: 01/28/2018 9:15 AM Medical Record Number: [id number] Patient Account Number: [id number] Date of Birth/Sex: 04/02/1981 (37 y.o. Female) Treating RN: Renne Crigler Primary Care Provider: PATIENT, NO Other Clinician: Referring Provider: Referral, Self Treating Provider/Extender: STONE III, HOYT Weeks in Treatment: 20 History of Present Illness HPI Description: 37 year old patient well known to our [facility] wound care clinic where she has been seen since 2016 for bilateral lower extremity venous insufficiency disease with lymphedema and multiple ulcerations associated with morbid obesity. she had custom-made compression stockings and lymphedema pumps which were used in the past. most recently she was admitted to the hospital between October 11 and 09/02/2017 with sepsis, lower extremity wounds and lymphedema.she was initially treated in the outpatient with Keflex and Bactrim. she was initially treated in the hospital with vancomycin and Zosyn and changed over to Unasyn until her white count improved and her blood cultures were negative for 3 days. After her inpatient management she was discharged home on Augmentin to end on 09/13/2017 with a 14 day course. she has had outpatient vascular duplex scans completed in November 2017 and her right ABI was 1.1 and the left ABI is 1.3. she had normal toe brachial indices bilaterally.she had three-vessel runoff in the right lower extremity and two-vessel runoff in the left lower  extremity. On questioning the patient she does have custom made compression stockings and also has a lymphedema pump but has not been using it appropriately and has not been taking good care of herself. 09/17/2017 -- she returns today with compression stockings on the left side and the right side has had significant amount of drainage and has a very strong odor 09/24/2017 -- the drainage is increased  significantly and she has more lymphedema and a very strong odor to her wound. Though she does not have systemic symptoms, or overt infection I believe she will benefit from some doxycycline given empirically. 10/01/2017 -- after starting the doxycycline and changing the dressing twice a week her symptoms and signs have definitely improved overall. 10/08/2017 -- she has completed her course of doxycycline but continues to have a lot of drainage and needs twice a week dressing changes. 11/08/17-she is here in follow-up evaluation for right lower extremity ulcers. She admits to using her lymphedema pumps twice daily, one hour per session. she is voicing no complaints or concerns, no signs of infection will change to [facility] 12/14/17 on evaluation today patient appears to be doing very well in regard to her wounds. She has been tolerating the dressing changes she continues to develop some portly the adherent granular tissue on the surface of the wound with some Slough. Obviously we are trying to get too much better wound bed. With that being said the hyper granulation the Hydrofera Blue Dressing to have helped with which is excellent news. However I think it may be time to try something a little bit different at this point. 01/11/18 on evaluation today patient appears to be doing fairly well in regard to her right lateral lower extremity ulcers. This shows excellent signs of filling in which is great news. There does not appear to be any evidence of infection which is also good news. She does continue to  work as well is good school. She is having no pain. 01/22/18 on evaluation today patient appears to be doing a little bit worse in my opinion in regard to the overall quality of that granulation on her right lower extremity. She was not here last week due to being sick with a stomach virus this may have something to do with the fact that her wound appears to be a little bit worse. With that being said I'm also thinking that after switching from the [facility] Dressing to the silver collagen would really has not looked that's good in my opinion. We may want to switch back. 01/28/18 on evaluation today patient appears to be doing a little bit worse in regard to her swelling but she states that she did Yazdi, Tryphena R. ([id number]) have to remove her wrap on Friday due to it getting tight in regard to the calf region. Subsequently she has been it wrapping this with an ace wrap until she came in today. The good news is her wounds actually appear to be doing better I do think that the overall granulation quality is much improved with the [facility] Dressing peer to with the Prisma. Patient is also not having as much discomfort today which is excellent news overall I think she's doing better. Electronic Signature(s) Signed: 01/29/2018 8:16:06 AM By: Lenda Kelp PA-C Entered By: Lenda Kelp on 01/28/2018 10:05:22 [name] ([id number]) -------------------------------------------------------------------------------- Physical Exam Details Patient Name: Jessica Casey R. Date of Service: 01/28/2018 9:15 AM Medical Record Number: [id number] Patient Account Number: [id number] Date of Birth/Sex: 03-03-1981 (36 y.o. Female) Treating RN: Renne Crigler Primary Care Provider: PATIENT, NO Other Clinician: Referring Provider: Referral, Self Treating Provider/Extender: STONE III, HOYT Weeks in Treatment: 20 Constitutional Well-nourished and well-hydrated in no acute  distress. Respiratory normal breathing without difficulty. Cardiovascular 2+ pitting edema of the bilateral lower extremities. Psychiatric this patient is able to make decisions and demonstrates good insight into disease process. Alert and Oriented x  3. pleasant and cooperative. Notes Patient's wound did have slough covering both wounds today although this did required debridement post debridement the wounds appear to be doing much better and she also does not seem to be having the same if you discover that she previously experienced when we switch her to the [facility] and the wounds did not look as good. Following debridement the wound bed appear to be doing excellent at this time. Electronic Signature(s) Signed: 01/29/2018 8:16:06 AM By: Lenda Kelp PA-C Entered By: Lenda Kelp on 01/28/2018 10:06:07 [name] ([id number]) -------------------------------------------------------------------------------- Physician Orders Details Patient Name: Jessica Casey R. Date of Service: 01/28/2018 9:15 AM Medical Record Number: [id number] Patient Account Number: [id number] Date of Birth/Sex: 06-Aug-1981 (37 y.o. Female) Treating RN: Renne Crigler Primary Care Provider: PATIENT, NO Other Clinician: Referring Provider: Referral, Self Treating Provider/Extender: STONE III, HOYT Weeks in Treatment: 20 Verbal / Phone Orders: No Diagnosis Coding ICD-10 Coding Code Description [phone] 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d I89.0 Lymphedema, not elsewhere classified I87.311 Chronic venous hypertension (idiopathic) with ulcer of right lower extremity E66.01 Morbid (severe) obesity due to excess calories Wound Cleansing Wound #2 Right,Distal,Lateral Lower Leg o Clean wound with Normal Saline. o Cleanse wound with mild soap and water Anesthetic (add to Medication List) Wound #2 Right,Distal,Lateral Lower Leg o Topical Lidocaine 4% cream applied to wound bed  prior to debridement (In Clinic Only). Skin Barriers/Peri-Wound Care Wound #2 Right,Distal,Lateral Lower Leg o Barrier cream o Moisturizing lotion Primary Wound Dressing Wound #2 Right,Distal,Lateral Lower Leg o Hydrafera Blue Secondary Dressing Wound #2 Right,Distal,Lateral Lower Leg o ABD pad o XtraSorb o Other - charcoal Dressing Change Frequency Wound #2 Right,Distal,Lateral Lower Leg o Change dressing every week Follow-up Appointments o Return Appointment in 1 week. o Nurse Visit as needed Edema Control Wound #2 Right,Distal,Lateral Lower Leg Ho, Deshanae R. ([id number]) o 4-Layer Compression System - Right Lower Extremity - unna to anchor o Elevate legs to the level of the heart and pump ankles as often as possible Additional Orders / Instructions Wound #2 Right,Distal,Lateral Lower Leg o Vitamin A; Vitamin C, Zinc o Increase protein intake. o Other: - Please watch your salt (sodium) intake Electronic Signature(s) Signed: 01/29/2018 8:16:06 AM By: Lenda Kelp PA-C Signed: 01/30/2018 8:28:46 AM By: Renne Crigler Entered By: Renne Crigler on 01/28/2018 10:04:50 [name] ([id number]) -------------------------------------------------------------------------------- Problem List Details Patient Name: Jessica Casey R. Date of Service: 01/28/2018 9:15 AM Medical Record Number: [id number] Patient Account Number: [id number] Date of Birth/Sex: 26-Feb-1981 (37 y.o. Female) Treating RN: Renne Crigler Primary Care Provider: PATIENT, NO Other Clinician: Referring Provider: Referral, Self Treating Provider/Extender: Linwood Dibbles, HOYT Weeks in Treatment: 20 Active Problems ICD-10 Encounter Code Description Active Date Diagnosis L97.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d 09/10/2017 Yes I89.0 Lymphedema, not elsewhere classified 09/10/2017 Yes I87.311 Chronic venous hypertension (idiopathic) with ulcer of right  lower 09/10/2017 Yes extremity E66.01 Morbid (severe) obesity due to excess calories 09/10/2017 Yes Inactive Problems Resolved Problems Electronic Signature(s) Signed: 01/29/2018 8:16:06 AM By: Lenda Kelp PA-C Entered By: Lenda Kelp on 01/28/2018 09:55:07 [name], [name] ([id number]) -------------------------------------------------------------------------------- Progress Note Details Patient Name: Jessica Casey R. Date of Service: 01/28/2018 9:15 AM Medical Record Number: [id number] Patient Account Number: [id number] Date of Birth/Sex: [birth year] (37 y.o. Female) Treating RN: Renne Crigler Primary Care Provider: PATIENT, NO Other Clinician: Referring Provider: Referral, Self Treating Provider/Extender: STONE III, HOYT Weeks in Treatment: 20 Subjective Chief Complaint Information  obtained from Patient She is here in follow up evaluation of right lower extremity ulcers History of Present Illness (HPI) 37 year old patient well known to our [facility] wound care clinic where she has been seen since 2016 for bilateral lower extremity venous insufficiency disease with lymphedema and multiple ulcerations associated with morbid obesity. she had custom-made compression stockings and lymphedema pumps which were used in the past. most recently she was admitted to the hospital between October 11 and 09/02/2017 with sepsis, lower extremity wounds and lymphedema.she was initially treated in the outpatient with Keflex and Bactrim. she was initially treated in the hospital with vancomycin and Zosyn and changed over to Unasyn until her white count improved and her blood cultures were negative for 3 days. After her inpatient management she was discharged home on Augmentin to end on 09/13/2017 with a 14 day course. she has had outpatient vascular duplex scans completed in November 2017 and her right ABI was 1.1 and the left ABI is 1.3. she had normal toe brachial indices bilaterally.she had  three-vessel runoff in the right lower extremity and two-vessel runoff in the left lower extremity. On questioning the patient she does have custom made compression stockings and also has a lymphedema pump but has not been using it appropriately and has not been taking good care of herself. 09/17/2017 -- she returns today with compression stockings on the left side and the right side has had significant amount of drainage and has a very strong odor 09/24/2017 -- the drainage is increased significantly and she has more lymphedema and a very strong odor to her wound. Though she does not have systemic symptoms, or overt infection I believe she will benefit from some doxycycline given empirically. 10/01/2017 -- after starting the doxycycline and changing the dressing twice a week her symptoms and signs have definitely improved overall. 10/08/2017 -- she has completed her course of doxycycline but continues to have a lot of drainage and needs twice a week dressing changes. 11/08/17-she is here in follow-up evaluation for right lower extremity ulcers. She admits to using her lymphedema pumps twice daily, one hour per session. she is voicing no complaints or concerns, no signs of infection will change to [facility] 12/14/17 on evaluation today patient appears to be doing very well in regard to her wounds. She has been tolerating the dressing changes she continues to develop some portly the adherent granular tissue on the surface of the wound with some Slough. Obviously we are trying to get too much better wound bed. With that being said the hyper granulation the Hydrofera Blue Dressing to have helped with which is excellent news. However I think it may be time to try something a little bit different at this point. 01/11/18 on evaluation today patient appears to be doing fairly well in regard to her right lateral lower extremity ulcers. This shows excellent signs of filling in which is great news. There does  not appear to be any evidence of infection which is also good news. She does continue to work as well is good school. She is having no pain. 01/22/18 on evaluation today patient appears to be doing a little bit worse in my opinion in regard to the overall quality of that [name], [name] ([id number]) granulation on her right lower extremity. She was not here last week due to being sick with a stomach virus this may have something to do with the fact that her wound appears to be a little bit worse. With that being said  I'm also thinking that after switching from the [facility] Dressing to the silver collagen would really has not looked that's good in my opinion. We may want to switch back. 01/28/18 on evaluation today patient appears to be doing a little bit worse in regard to her swelling but she states that she did have to remove her wrap on Friday due to it getting tight in regard to the calf region. Subsequently she has been it wrapping this with an ace wrap until she came in today. The good news is her wounds actually appear to be doing better I do think that the overall granulation quality is much improved with the [facility] Dressing peer to with the Prisma. Patient is also not having as much discomfort today which is excellent news overall I think she's doing better. Patient History Information obtained from Patient. Family History Kidney Disease - Mother, No family history of Cancer, Diabetes, Heart Disease, Hereditary Spherocytosis, Hypertension, Lung Disease, Seizures, Stroke, Thyroid Problems, Tuberculosis. Social History Never smoker, Marital Status - Married, Alcohol Use - Never, Drug Use - No History, Caffeine Use - Daily. Review of Systems (ROS) Constitutional Symptoms (General Health) Denies complaints or symptoms of Fever, Chills. Respiratory The patient has no complaints or symptoms. Cardiovascular Complains or has symptoms of LE edema. Psychiatric The patient has no  complaints or symptoms. Objective Constitutional Well-nourished and well-hydrated in no acute distress. Vitals Time Taken: 9:37 AM, Height: 74 in, Weight: 505 lbs, BMI: 64.8, Temperature: 98.1 F, Pulse: 82 bpm, Respiratory Rate: 18 breaths/min, Blood Pressure: 139/88 mmHg. Respiratory normal breathing without difficulty. Cardiovascular 2+ pitting edema of the bilateral lower extremities. Psychiatric this patient is able to make decisions and demonstrates good insight into disease process. Alert and Oriented x 3. pleasant and cooperative. ARLETA, OSTRUM ([id number]) General Notes: Patient's wound did have slough covering both wounds today although this did required debridement post debridement the wounds appear to be doing much better and she also does not seem to be having the same if you discover that she previously experienced when we switch her to the [facility] and the wounds did not look as good. Following debridement the wound bed appear to be doing excellent at this time. Integumentary (Hair, Skin) Wound #2 status is Open. Original cause of wound was Gradually Appeared. The wound is located on the Right,Distal,Lateral Lower Leg. The wound measures 2.1cm length x 4.2cm width x 0.1cm depth; 6.927cm^2 area and 0.693cm^3 volume. There is Fat Layer (Subcutaneous Tissue) Exposed exposed. There is no tunneling or undermining noted. There is a large amount of serosanguineous drainage noted. The wound margin is flat and intact. There is large (67-100%) red granulation within the wound bed. There is a small (1-33%) amount of necrotic tissue within the wound bed including Adherent Slough. The periwound skin appearance had no abnormalities noted for color. The periwound skin appearance exhibited: Scarring. The periwound skin appearance did not exhibit: Callus, Crepitus, Excoriation, Induration, Rash, Dry/Scaly, Maceration. Periwound temperature was noted as No Abnormality. The periwound has  tenderness on palpation. Assessment Active Problems ICD-10 L97.212 - 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d I89.0 - Lymphedema, not elsewhere classified I87.311 - Chronic venous hypertension (idiopathic) with ulcer of right lower extremity E66.01 - Morbid (severe) obesity due to excess calories Procedures Wound #2 Pre-procedure diagnosis of Wound #2 is a Lymphedema located on the Right,Distal,Lateral Lower Leg . There was a Skin/Subcutaneous Tissue Debridement ([id number]) debridement with total area of 8.82 sq cm performed by  STONE III, HOYT E., PA-C. with the following instrument(s): Curette to remove Viable and Non-Viable tissue/material including Fibrin/Slough and Subcutaneous after achieving pain control using Other (lidocaine 4%). A time out was conducted at 09:59, prior to the start of the procedure. A Minimum amount of bleeding was controlled with Pressure. The procedure was tolerated well with a pain level of 0 throughout and a pain level of 0 following the procedure. Post Debridement Measurements: 2.1cm length x 4.2cm width x 0.1cm depth; 0.693cm^3 volume. Character of Wound/Ulcer Post Debridement is stable. Post procedure Diagnosis Wound #2: Same as Pre-Procedure Plan Wound Cleansing: Wound #2 Right,Distal,Lateral Lower Leg: Clean wound with Normal Saline. [name], [name] ([id number]) Cleanse wound with mild soap and water Anesthetic (add to Medication List): Wound #2 Right,Distal,Lateral Lower Leg: Topical Lidocaine 4% cream applied to wound bed prior to debridement (In Clinic Only). Skin Barriers/Peri-Wound Care: Wound #2 Right,Distal,Lateral Lower Leg: Barrier cream Moisturizing lotion Primary Wound Dressing: Wound #2 Right,Distal,Lateral Lower Leg: Hydrafera Blue Secondary Dressing: Wound #2 Right,Distal,Lateral Lower Leg: ABD pad XtraSorb Other - charcoal Dressing Change Frequency: Wound #2 Right,Distal,Lateral Lower Leg: Change  dressing every week Follow-up Appointments: Return Appointment in 1 week. Nurse Visit as needed Edema Control: Wound #2 Right,Distal,Lateral Lower Leg: 4-Layer Compression System - Right Lower Extremity - unna to anchor Elevate legs to the level of the heart and pump ankles as often as possible Additional Orders / Instructions: Wound #2 Right,Distal,Lateral Lower Leg: Vitamin A; Vitamin C, Zinc Increase protein intake. Other: - Please watch your salt (sodium) intake I am going to recommend that we continue with the Current wound care measures for the next week hopefully her swelling will start coming down as much as she can elevate the leg I did recommend that she do so. Patient is in agreement with the plan. Please see above for specific wound care orders. We will see patient for re-evaluation in 1 week(s) here in the clinic. If anything worsens or changes patient will contact our office for additional recommendations. Electronic Signature(s) Signed: 01/29/2018 8:16:06 AM By: Lenda Kelp PA-C Entered By: Lenda Kelp on 01/28/2018 10:06:33 [name] ([id number]) -------------------------------------------------------------------------------- ROS/PFSH Details Patient Name: Jessica Casey R. Date of Service: 01/28/2018 9:15 AM Medical Record Number: [id number] Patient Account Number: [id number] Date of Birth/Sex: 11-26-1980 (36 y.o. Female) Treating RN: Renne Crigler Primary Care Provider: PATIENT, NO Other Clinician: Referring Provider: Referral, Self Treating Provider/Extender: STONE III, HOYT Weeks in Treatment: 20 Information Obtained From Patient Wound History Do you currently have one or more open woundso Yes How many open wounds do you currently haveo 1 Approximately how long have you had your woundso 1 month How have you been treating your wound(s) until nowo aquacel ag Has your wound(s) ever healed and then re-openedo No Have you had any lab work done in the  past montho No Have you tested positive for an antibiotic resistant organism (MRSA, VRE)o No Have you tested positive for osteomyelitis (bone infection)o No Have you had any tests for circulation on your legso Yes Who ordered the testo [facility] Where was the test doneo [facility] Constitutional Symptoms (General Health) Complaints and Symptoms: Negative for: Fever; Chills Cardiovascular Complaints and Symptoms: Positive for: LE edema Medical History: Positive for: Hypertension Negative for: Angina; Arrhythmia; Congestive Heart Failure; Coronary Artery Disease; Deep Vein Thrombosis; Hypotension; Myocardial Infarction; Peripheral Arterial Disease; Peripheral Venous Disease; Phlebitis; Vasculitis Hematologic/Lymphatic Medical History: Positive for: Lymphedema Respiratory Complaints and Symptoms: No Complaints or Symptoms Medical History: Negative for:  Aspiration; Asthma; Chronic Obstructive Pulmonary Disease (COPD); Pneumothorax; Sleep Apnea; Tuberculosis Psychiatric Complaints and Symptoms: No Complaints or Symptoms [name], [name] ([id number]) Immunizations Pneumococcal Vaccine: Received Pneumococcal Vaccination: No Immunization Notes: up to date Implantable Devices Family and Social History Cancer: No; Diabetes: No; Heart Disease: No; Hereditary Spherocytosis: No; Hypertension: No; Kidney Disease: Yes - Mother; Lung Disease: No; Seizures: No; Stroke: No; Thyroid Problems: No; Tuberculosis: No; Never smoker; Marital Status - Married; Alcohol Use: Never; Drug Use: No History; Caffeine Use: Daily; Financial Concerns: No; Food, Clothing or Shelter Needs: No; Support System Lacking: No; Transportation Concerns: No; Advanced Directives: No; Patient does not want information on Advanced Directives Physician Affirmation I have reviewed and agree with the above information. Electronic Signature(s) Signed: 01/29/2018 8:16:06 AM By: Lenda KelpStone III, Hoyt PA-C Signed: 01/30/2018 8:28:46 AM By:  Renne CriglerFlinchum, [name] Entered By: Lenda KelpStone III, Hoyt on 01/28/2018 10:05:40 [name], Jessica Marland Kitchen. ([id number]) -------------------------------------------------------------------------------- SuperBill Details Patient Name: Jessica CaseyLEATH, Karene R. Date of Service: 01/28/2018 Medical Record Number: [id number] Patient Account Number: [id number] Date of Birth/Sex: 04-13-1981 (37 y.o. 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 I89.0 Lymphedema, not elsewhere classified I87.311 Chronic venous hypertension (idiopathic) with ulcer of right lower extremity E66.01 Morbid (severe) obesity due to excess calories Facility Procedures CPT4 Code: [id number] Description: 11042 - DEB SUBQ TISSUE 20 SQ CM/< ICD-10 Diagnosis Description L97.212 Non-pressure chronic ulcer of right calf with fat layer expo Modifier: sed Quantity: 1 Physician Procedures CPT4 Code: [id number] Description: 11042 - WC PHYS SUBQ TISS 20 SQ CM ICD-10 Diagnosis Description L97.212 Non-pressure chronic ulcer of right calf with fat layer expo Modifier: sed Quantity: 1 Electronic Signature(s) Signed: 01/29/2018 8:16:06 AM By: Lenda KelpStone III, Hoyt PA-C Entered By: Lenda KelpStone III, Hoyt on 01/28/2018 10:07:07

## 2018-01-31 NOTE — Progress Notes (Signed)
Jessica, [name] ([id number]) Visit Report for 01/28/2018 Arrival Information Details Patient Name: [name], [name] Date of Service: 01/28/2018 9:15 AM Medical Record Number: [id number] Patient Account Number: [id number] Date of Birth/Sex: 21-Sep-1981 (37 y.o. Female) Treating RN: Ashok Cordia, Debi Primary Care Leamon Palau: PATIENT, NO Other Clinician: Referring Almina Schul: Referral, Self Treating Lakoda Mcanany/Extender: STONE III, HOYT Weeks in Treatment: 20 Visit Information History Since Last Visit All ordered tests and consults were completed: No Patient Arrived: Ambulatory Added or deleted any medications: No Arrival Time: 09:36 Any new allergies or adverse reactions: No Accompanied By: self Had a fall or experienced change in No Transfer Assistance: None activities of daily living that may affect Patient Identification Verified: Yes risk of falls: Secondary Verification Process Completed: Yes Signs or symptoms of abuse/neglect since last visito No Patient Requires Transmission-Based No Hospitalized since last visit: No Precautions: Has Dressing in Place as Prescribed: Yes Patient Has Alerts: No Has Compression in Place as Prescribed: No Pain Present Now: No Notes pt didn't have on compression she took it off it got too tight Electronic Signature(s) Signed: 01/30/2018 8:38:15 AM By: [name] Entered By: [name] on 01/28/2018 09:37:18 Durante, Chalee R. ([id number]) -------------------------------------------------------------------------------- Clinic Level of Care Assessment Details Patient Name: Jessica Casey R. Date of Service: 01/28/2018 9:15 AM Medical Record Number: [id number] Patient Account Number: [id number] Date of Birth/Sex: March 15, 1981 (37 y.o. Female) Treating RN: Renne Crigler Primary Care Xaiden Fleig: PATIENT, NO Other Clinician: Referring Keino Placencia: Referral, Self Treating Kortnee Bas/Extender: STONE III, HOYT Weeks in Treatment: 20 Clinic Level of Care  Assessment Items TOOL 1 Quantity Score []  - Use when EandM and Procedure is performed on INITIAL visit 0 ASSESSMENTS - Nursing Assessment / Reassessment []  - General Physical Exam (combine w/ comprehensive assessment (listed just below) when 0 performed on new pt. evals) []  - 0 Comprehensive Assessment (HX, ROS, Risk Assessments, Wounds Hx, etc.) ASSESSMENTS - Wound and Skin Assessment / Reassessment []  - Dermatologic / Skin Assessment (not related to wound area) 0 ASSESSMENTS - Ostomy and/or Continence Assessment and Care []  - Incontinence Assessment and Management 0 []  - 0 Ostomy Care Assessment and Management (repouching, etc.) PROCESS - Coordination of Care []  - Simple Patient / Family Education for ongoing care 0 []  - 0 Complex (extensive) Patient / Family Education for ongoing care []  - 0 Staff obtains Chiropractor, Records, Test Results / Process Orders []  - 0 Staff telephones HHA, Nursing Homes / Clarify orders / etc []  - 0 Routine Transfer to another Facility (non-emergent condition) []  - 0 Routine Hospital Admission (non-emergent condition) []  - 0 New Admissions / Manufacturing engineer / Ordering NPWT, Apligraf, etc. []  - 0 Emergency Hospital Admission (emergent condition) PROCESS - Special Needs []  - Pediatric / Minor Patient Management 0 []  - 0 Isolation Patient Management []  - 0 Hearing / Language / Visual special needs []  - 0 Assessment of Community assistance (transportation, D/C planning, etc.) []  - 0 Additional assistance / Altered mentation []  - 0 Support Surface(s) Assessment (bed, cushion, seat, etc.) Millikan, Blanchie R. ([id number]) INTERVENTIONS - Miscellaneous []  - External ear exam 0 []  - 0 Patient Transfer (multiple staff / Nurse, adult / Similar devices) []  - 0 Simple Staple / Suture removal (25 or less) []  - 0 Complex Staple / Suture removal (26 or more) []  - 0 Hypo/Hyperglycemic Management (do not check if billed separately) []  - 0 Ankle /  Brachial Index (ABI) - do not check if billed separately Has the patient been seen at the hospital within the last  three years: Yes Total Score: 0 Level Of Care: ____ Electronic Signature(s) Signed: 01/30/2018 8:28:46 AM By: Renne CriglerFlinchum, Cheryl Entered By: Renne CriglerFlinchum, Cheryl on 01/28/2018 10:05:05 Marlaine HindLEATH, Korissa R. ([id number]) -------------------------------------------------------------------------------- Encounter Discharge Information Details Patient Name: Jessica CaseyLEATH, [name] Date of Service: 01/28/2018 9:15 AM Medical Record Number: [id number] Patient Account Number: [id number] Date of Birth/Sex: 03-11-1981 (37 y.o. Female) Treating RN: Renne CriglerFlinchum, Cheryl Primary Care Aibhlinn Kalmar: PATIENT, NO Other Clinician: Referring Elfie Costanza: Referral, Self Treating Zanaria Morell/Extender: STONE III, HOYT Weeks in Treatment: 20 Encounter Discharge Information Items Discharge Pain Level: 0 Discharge Condition: Stable Ambulatory Status: Ambulatory Discharge Destination: Home Transportation: Private Auto Accompanied By: self Schedule Follow-up Appointment: Yes Medication Reconciliation completed and No provided to Patient/Care Ludwika Rodd: Provided on Clinical Summary of Care: 01/28/2018 Form Type Recipient Paper Patient EL Electronic Signature(s) Signed: 01/28/2018 12:38:16 PM By: Curtis Sitesorthy, Joanna Entered By: Curtis Sitesorthy, Joanna on 01/28/2018 12:38:15 Covelli, Haeven R. ([id number]) -------------------------------------------------------------------------------- Lower Extremity Assessment Details Patient Name: Jessica CaseyLEATH, [name] Date of Service: 01/28/2018 9:15 AM Medical Record Number: [id number] Patient Account Number: [id number] Date of Birth/Sex: 03-11-1981 (37 y.o. Female) Treating RN: Ashok CordiaPinkerton, Debi Primary Care Nhyla Nappi: PATIENT, NO Other Clinician: Referring Pressley Barsky: Referral, Self Treating Shylyn Younce/Extender: STONE III, HOYT Weeks in Treatment: 20 Edema Assessment Assessed: [Left: No] [Right: No] [Left: Edema]  [Right: :] Calf Left: Right: Point of Measurement: 35 cm From Medial Instep cm 63.7 cm Ankle Left: Right: Point of Measurement: 9 cm From Medial Instep cm 36.7 cm Vascular Assessment Pulses: Dorsalis Pedis Doppler Audible: [Right:Yes] Posterior Tibial Extremity colors, hair growth, and conditions: Extremity Color: [Right:Hyperpigmented] Temperature of Extremity: [Right:Warm] Capillary Refill: [Right:< 3 seconds] Toe Nail Assessment Left: Right: Thick: Yes Discolored: Yes Deformed: Yes Improper Length and Hygiene: Yes Electronic Signature(s) Signed: 01/30/2018 8:38:15 AM By: Alejandro MullingPinkerton, Debra Entered By: Alejandro MullingPinkerton, Debra on 01/28/2018 09:43:25 Ries, Lissandra R. ([id number]) -------------------------------------------------------------------------------- Multi Wound Chart Details Patient Name: Jessica CaseyLEATH, Everlyn R. Date of Service: 01/28/2018 9:15 AM Medical Record Number: [id number] Patient Account Number: [id number] Date of Birth/Sex: 03-11-1981 (37 y.o. Female) Treating RN: Renne CriglerFlinchum, Cheryl Primary Care Omran Keelin: PATIENT, NO Other Clinician: Referring Annastacia Duba: Referral, Self Treating Byrd Terrero/Extender: STONE III, HOYT Weeks in Treatment: 20 Vital Signs Height(in): 74 Pulse(bpm): 82 Weight(lbs): 505 Blood Pressure(mmHg): 139/88 Body Mass Index(BMI): 65 Temperature(F): 98.1 Respiratory Rate 18 (breaths/min): Photos: [2:No Photos] [N/A:N/A] Wound Location: [2:Right Lower Leg - Lateral, Distal] [N/A:N/A] Wounding Event: [2:Gradually Appeared] [N/A:N/A] Primary Etiology: [2:Lymphedema] [N/A:N/A] Comorbid History: [2:Lymphedema, Hypertension] [N/A:N/A] Date Acquired: [2:08/08/2017] [N/A:N/A] Weeks of Treatment: [2:20] [N/A:N/A] Wound Status: [2:Open] [N/A:N/A] Clustered Wound: [2:Yes] [N/A:N/A] Clustered Quantity: [2:2] [N/A:N/A] Measurements L x W x D [2:2.1x4.2x0.1] [N/A:N/A] (cm) Area (cm) : [2:6.927] [N/A:N/A] Volume (cm) : [2:0.693] [N/A:N/A] % Reduction in Area:  [2:67.70%] [N/A:N/A] % Reduction in Volume: [2:89.20%] [N/A:N/A] Classification: [2:Full Thickness With Exposed Support Structures] [N/A:N/A] Exudate Amount: [2:Large] [N/A:N/A] Exudate Type: [2:Serosanguineous] [N/A:N/A] Exudate Color: [2:red, brown] [N/A:N/A] Wound Margin: [2:Flat and Intact] [N/A:N/A] Granulation Amount: [2:Large (67-100%)] [N/A:N/A] Granulation Quality: [2:Red] [N/A:N/A] Necrotic Amount: [2:Small (1-33%)] [N/A:N/A] Exposed Structures: [2:Fat Layer (Subcutaneous Tissue) Exposed: Yes Fascia: No Tendon: No Muscle: No Joint: No Bone: No] [N/A:N/A] Epithelialization: [2:Small (1-33%)] [N/A:N/A] Periwound Skin Texture: [2:Scarring: Yes Excoriation: No Induration: No] [N/A:N/A] Callus: No Crepitus: No Rash: No Periwound Skin Moisture: Maceration: No N/A N/A Dry/Scaly: No Periwound Skin Color: Atrophie Blanche: No N/A N/A Cyanosis: No Ecchymosis: No Erythema: No Hemosiderin Staining: No Mottled: No Pallor: No Rubor: No Temperature: No Abnormality N/A N/A Tenderness on Palpation: Yes N/A N/A Wound Preparation: Ulcer Cleansing: N/A N/A Rinsed/Irrigated with  Saline, Other: soap and water Topical Anesthetic Applied: Other: lidocaine 4% Treatment Notes Electronic Signature(s) Signed: 01/30/2018 8:28:46 AM By: Renne Crigler Entered By: Renne Crigler on 01/28/2018 09:58:51 [place], Cristal Deer ([id number]) -------------------------------------------------------------------------------- Multi-Disciplinary Care Plan Details Patient Name: Jessica Casey R. Date of Service: 01/28/2018 9:15 AM Medical Record Number: [id number] Patient Account Number: [id number] Date of Birth/Sex: 08/19/1981 (37 y.o. Female) Treating RN: Renne Crigler Primary Care Deonne Rooks: PATIENT, NO Other Clinician: Referring Donevan Biller: Referral, Self Treating Libby Goehring/Extender: STONE III, HOYT Weeks in Treatment: 20 Active Inactive ` Orientation to the Wound Care Program Nursing  Diagnoses: Knowledge deficit related to the wound healing center program Goals: Patient/caregiver will verbalize understanding of the Wound Healing Center Program Date Initiated: 09/10/2017 Target Resolution Date: 11/23/2017 Goal Status: Active Interventions: Provide education on orientation to the wound center Notes: ` Venous Leg Ulcer Nursing Diagnoses: Knowledge deficit related to disease process and management Goals: Patient/caregiver will verbalize understanding of disease process and disease management Date Initiated: 09/10/2017 Target Resolution Date: 11/23/2017 Goal Status: Active Interventions: Assess peripheral edema status every visit. Notes: ` Wound/Skin Impairment Nursing Diagnoses: Impaired tissue integrity Goals: Ulcer/skin breakdown will heal within 14 weeks Date Initiated: 09/10/2017 Target Resolution Date: 11/24/2017 Goal Status: Active Interventions: REVER, PICHETTE ([id number]) Assess patient/caregiver ability to obtain necessary supplies Assess patient/caregiver ability to perform ulcer/skin care regimen upon admission and as needed Assess ulceration(s) every visit Notes: Electronic Signature(s) Signed: 01/30/2018 8:28:46 AM By: Renne Crigler Entered By: Renne Crigler on 01/28/2018 09:58:41 Cronic, Zavannah R. ([id number]) -------------------------------------------------------------------------------- Pain Assessment Details Patient Name: Jessica Casey R. Date of Service: 01/28/2018 9:15 AM Medical Record Number: [id number] Patient Account Number: [id number] Date of Birth/Sex: 01-22-1981 (37 y.o. Female) Treating RN: Ashok Cordia, Debi Primary Care Cory Rama: PATIENT, NO Other Clinician: Referring Charmon Thorson: Referral, Self Treating Daliah Chaudoin/Extender: STONE III, HOYT Weeks in Treatment: 20 Active Problems Location of Pain Severity and Description of Pain Patient Has Paino No Site Locations Pain Management and Medication Current Pain  Management: Electronic Signature(s) Signed: 01/30/2018 8:38:15 AM By: [name] Entered By: [name] on 01/28/2018 09:37:25 Cagley, Cristal Deer ([id number]) -------------------------------------------------------------------------------- Patient/Caregiver Education Details Patient Name: Jessica Casey R. Date of Service: 01/28/2018 9:15 AM Medical Record Number: [id number] Patient Account Number: [id number] Date of Birth/Gender: 1981-01-12 (37 y.o. Female) Treating RN: Curtis Sites Primary Care Physician: PATIENT, NO Other Clinician: Referring Physician: Referral, Self Treating Physician/Extender: Linwood Dibbles, HOYT Weeks in Treatment: 20 Education Assessment Education Provided To: Patient Education Topics Provided Venous: Handouts: Other: continue using pumps Methods: Explain/Verbal Responses: State content correctly Electronic Signature(s) Signed: 01/28/2018 4:28:14 PM By: Curtis Sites Entered By: Curtis Sites on 01/28/2018 12:38:35 Mccoy, Kasen R. ([id number]) -------------------------------------------------------------------------------- Wound Assessment Details Patient Name: Jessica Casey R. Date of Service: 01/28/2018 9:15 AM Medical Record Number: [id number] Patient Account Number: [id number] Date of Birth/Sex: Jun 25, 1981 (37 y.o. Female) Treating RN: Ashok Cordia, Debi Primary Care Chaise Passarella: PATIENT, NO Other Clinician: Referring Ramiyah Mcclenahan: Referral, Self Treating Majed Pellegrin/Extender: STONE III, HOYT Weeks in Treatment: 20 Wound Status Wound Number: 2 Primary Etiology: Lymphedema Wound Location: Right Lower Leg - Lateral, Distal Wound Status: Open Wounding Event: Gradually Appeared Comorbid History: Lymphedema, Hypertension Date Acquired: 08/08/2017 Weeks Of Treatment: 20 Clustered Wound: Yes Photos Photo Uploaded By: Curtis Sites on 01/28/2018 13:04:45 Wound Measurements Length: (cm) 2.1 % Redu Width: (cm) 4.2 % Redu Depth: (cm) 0.1 Epithe Clustered  Quantity: 2 Tunnel Area: (cm) 6.927 Under Volume: (cm) 0.693 ction in Area: 67.7% ction in Volume: 89.2% lialization: Small (1-33%) ing: No mining: No Wound Description Full Thickness With  Exposed Support Classification: Structures Wound Margin: Flat and Intact Exudate Large Amount: Exudate Type: Serosanguineous Exudate Color: red, brown Foul Odor After Cleansing: No Slough/Fibrino Yes Wound Bed Granulation Amount: Large (67-100%) Exposed Structure Granulation Quality: Red Fascia Exposed: No Necrotic Amount: Small (1-33%) Fat Layer (Subcutaneous Tissue) Exposed: Yes Necrotic Quality: Adherent Slough Tendon Exposed: No Muscle Exposed: No Joint Exposed: No Wolfman, Lynnzie R. ([id number]) Bone Exposed: No Periwound Skin Texture Texture Color No Abnormalities Noted: No No Abnormalities Noted: Yes Callus: No Temperature / Pain Crepitus: No Temperature: No Abnormality Excoriation: No Tenderness on Palpation: Yes Induration: No Rash: No Scarring: Yes Moisture No Abnormalities Noted: No Dry / Scaly: No Maceration: No Wound Preparation Ulcer Cleansing: Rinsed/Irrigated with Saline, Other: soap and water, Topical Anesthetic Applied: Other: lidocaine 4%, Treatment Notes Wound #2 (Right, Distal, Lateral Lower Leg) 1. Cleansed with: Clean wound with Normal Saline Cleanse wound with antibacterial soap and water 2. Anesthetic Topical Lidocaine 4% cream to wound bed prior to debridement 4. Dressing Applied: Hydrafera Blue Other dressing (specify in notes) 5. Secondary Dressing Applied ABD Pad 7. Secured with 4-Layer Compression System - Right Lower Extremity Notes xtrasorb, unna to Ecologist) Signed: 01/30/2018 8:38:15 AM By: [name] Entered By: [name] on 01/28/2018 09:44:00 Woolen, Edom R. ([id number]) -------------------------------------------------------------------------------- Vitals Details Patient Name: Jessica Casey R. Date of Service: 01/28/2018 9:15 AM Medical Record Number: [id number] Patient Account Number: [id number] Date of Birth/Sex: 05/01/1981 (37 y.o. Female) Treating RN: Ashok Cordia, Debi Primary Care Charo Philipp: PATIENT, NO Other Clinician: Referring Elwin Tsou: Referral, Self Treating Chalmers Iddings/Extender: STONE III, HOYT Weeks in Treatment: 20 Vital Signs Time Taken: 09:37 Temperature (F): 98.1 Height (in): 74 Pulse (bpm): 82 Weight (lbs): 505 Respiratory Rate (breaths/min): 18 Body Mass Index (BMI): 64.8 Blood Pressure (mmHg): 139/88 Reference Range: 80 - 120 mg / dl Electronic Signature(s) Signed: 01/30/2018 8:38:15 AM By: [name] Entered By: [name] on 01/28/2018 09:37:57

## 2018-02-04 ENCOUNTER — Encounter: Payer: Self-pay | Admitting: Physician Assistant

## 2018-02-05 NOTE — Progress Notes (Signed)
SHAMIYA, DEMERITT ([id number]) Visit Report for 02/04/2018 Chief Complaint Document Details Patient Name: [name], [name] Date of Service: 02/04/2018 9:15 AM Medical Record Number: [id number] Patient Account Number: [id number] Date of Birth/Sex: [birth year] (37 y.o. Female) Treating RN: Renne Crigler Primary Care Provider: PATIENT, NO Other Clinician: Referring Provider: Referral, Self Treating Provider/Extender: STONE III, HOYT Weeks in Treatment: 21 Information Obtained from: Patient Chief Complaint She is here in follow up evaluation of right lower extremity ulcers Electronic Signature(s) Signed: 02/05/2018 12:14:27 AM By: Lenda Kelp PA-C Entered By: Lenda Kelp on 02/04/2018 09:52:27 Ask, Verenice R. ([id number]) -------------------------------------------------------------------------------- Debridement Details Patient Name: Jessica Casey R. Date of Service: 02/04/2018 9:15 AM Medical Record Number: [id number] Patient Account Number: [id number] Date of Birth/Sex: 1981-08-20 (37 y.o. Female) Treating RN: Renne Crigler Primary Care Provider: PATIENT, NO Other Clinician: Referring Provider: Referral, Self Treating Provider/Extender: STONE III, HOYT Weeks in Treatment: 21 Debridement Performed for Wound #2 Right,Distal,Lateral Lower Leg Assessment: Performed By: Physician STONE III, HOYT E., PA-C Debridement: Debridement Pre-procedure Verification/Time Yes - 09:56 Out Taken: Start Time: 09:56 Pain Control: Other : lidocaine 4% Level: Skin/Subcutaneous Tissue Total Area Debrided (L x W): 1.5 (cm) x 4.2 (cm) = 6.3 (cm) Tissue and other material Viable, Non-Viable, Fibrin/Slough, Subcutaneous debrided: Instrument: Curette Bleeding: Minimum Hemostasis Achieved: Pressure End Time: 09:57 Procedural Pain: 0 Post Procedural Pain: 0 Response to Treatment: Procedure was tolerated well Post Debridement Measurements of Total Wound Length: (cm) 1.5 Width: (cm) 4.2 Depth:  (cm) 0.2 Volume: (cm) 0.99 Character of Wound/Ulcer Post Debridement: Stable Post Procedure Diagnosis Same as Pre-procedure Electronic Signature(s) Signed: 02/04/2018 4:59:35 PM By: Renne Crigler Signed: 02/05/2018 12:14:27 AM By: Lenda Kelp PA-C Entered By: Renne Crigler on 02/04/2018 09:57:03 [name], [name] ([id number]) -------------------------------------------------------------------------------- HPI Details Patient Name: Jessica Casey R. Date of Service: 02/04/2018 9:15 AM Medical Record Number: [id number] Patient Account Number: [id number] Date of Birth/Sex: 1981-04-01 (37 y.o. Female) Treating RN: Renne Crigler Primary Care Provider: PATIENT, NO Other Clinician: Referring Provider: Referral, Self Treating Provider/Extender: STONE III, HOYT Weeks in Treatment: 21 History of Present Illness HPI Description: 37 year old patient well known to our [facility] wound care clinic where she has been seen since 2016 for bilateral lower extremity venous insufficiency disease with lymphedema and multiple ulcerations associated with morbid obesity. she had custom-made compression stockings and lymphedema pumps which were used in the past. most recently she was admitted to the hospital between October 11 and 09/02/2017 with sepsis, lower extremity wounds and lymphedema.she was initially treated in the outpatient with Keflex and Bactrim. she was initially treated in the hospital with vancomycin and Zosyn and changed over to Unasyn until her white count improved and her blood cultures were negative for 3 days. After her inpatient management she was discharged home on Augmentin to end on 09/13/2017 with a 14 day course. she has had outpatient vascular duplex scans completed in November 2017 and her right ABI was 1.1 and the left ABI is 1.3. she had normal toe brachial indices bilaterally.she had three-vessel runoff in the right lower extremity and two-vessel runoff in the left lower  extremity. On questioning the patient she does have custom made compression stockings and also has a lymphedema pump but has not been using it appropriately and has not been taking good care of herself. 09/17/2017 -- she returns today with compression stockings on the left side and the right side has had significant amount of drainage and has a very strong odor 09/24/2017 -- the drainage is increased  significantly and she has more lymphedema and a very strong odor to her wound. Though she does not have systemic symptoms, or overt infection I believe she will benefit from some doxycycline given empirically. 10/01/2017 -- after starting the doxycycline and changing the dressing twice a week her symptoms and signs have definitely improved overall. 10/08/2017 -- she has completed her course of doxycycline but continues to have a lot of drainage and needs twice a week dressing changes. 11/08/17-she is here in follow-up evaluation for right lower extremity ulcers. She admits to using her lymphedema pumps twice daily, one hour per session. she is voicing no complaints or concerns, no signs of infection will change to [facility] 12/14/17 on evaluation today patient appears to be doing very well in regard to her wounds. She has been tolerating the dressing changes she continues to develop some portly the adherent granular tissue on the surface of the wound with some Slough. Obviously we are trying to get too much better wound bed. With that being said the hyper granulation the Hydrofera Blue Dressing to have helped with which is excellent news. However I think it may be time to try something a little bit different at this point. 01/11/18 on evaluation today patient appears to be doing fairly well in regard to her right lateral lower extremity ulcers. This shows excellent signs of filling in which is great news. There does not appear to be any evidence of infection which is also good news. She does continue to  work as well is good school. She is having no pain. 01/22/18 on evaluation today patient appears to be doing a little bit worse in my opinion in regard to the overall quality of that granulation on her right lower extremity. She was not here last week due to being sick with a stomach virus this may have something to do with the fact that her wound appears to be a little bit worse. With that being said I'm also thinking that after switching from the [facility] Dressing to the silver collagen would really has not looked that's good in my opinion. We may want to switch back. 02/04/18 on evaluation today patient appears to be doing excellent in regard to her right lateral lower from the ulcer. She has [name], [name] ([id number]) been tolerating the dressing changes without complication. With that being said she does continue to note improvement in regard to her wounds and I do feel the [facility] Dressing been excellent for her. She is having no significant discomfort. Electronic Signature(s) Signed: 02/05/2018 12:14:27 AM By: Lenda Kelp PA-C Entered By: Lenda Kelp on 02/04/2018 10:00:07 [name] ([id number]) -------------------------------------------------------------------------------- Physical Exam Details Patient Name: Jessica Casey R. Date of Service: 02/04/2018 9:15 AM Medical Record Number: [id number] Patient Account Number: [id number] Date of Birth/Sex: Aug 14, 1981 (37 y.o. Female) Treating RN: Renne Crigler Primary Care Provider: PATIENT, NO Other Clinician: Referring Provider: Referral, Self Treating Provider/Extender: STONE III, HOYT Weeks in Treatment: 21 Constitutional Obese and well-hydrated in no acute distress. Respiratory normal breathing without difficulty. Psychiatric this patient is able to make decisions and demonstrates good insight into disease process. Alert and Oriented x 3. pleasant and cooperative. Notes Currently patient's wound does show a  good granular surface there is some Slough/biofilm noted on the surface of the wound which was sharply debrided away today and patient tolerated this without complication. Overall I'm very pleased with how things are progressing. Electronic Signature(s) Signed: 02/05/2018 12:14:27 AM By: Lenda Kelp PA-C  Entered By: Lenda Kelp on 02/04/2018 10:00:49 [name] ([id number]) -------------------------------------------------------------------------------- Physician Orders Details Patient Name: Jessica Casey R. Date of Service: 02/04/2018 9:15 AM Medical Record Number: [id number] Patient Account Number: [id number] Date of Birth/Sex: 07-Jan-1981 (37 y.o. Female) Treating RN: Renne Crigler Primary Care Provider: PATIENT, NO Other Clinician: Referring Provider: Referral, Self Treating Provider/Extender: STONE III, HOYT Weeks in Treatment: 21 Verbal / Phone Orders: No Diagnosis Coding ICD-10 Coding Code Description [phone] 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d I89.0 Lymphedema, not elsewhere classified I87.311 Chronic venous hypertension (idiopathic) with ulcer of right lower extremity E66.01 Morbid (severe) obesity due to excess calories Wound Cleansing Wound #2 Right,Distal,Lateral Lower Leg o Clean wound with Normal Saline. o Cleanse wound with mild soap and water Anesthetic (add to Medication List) Wound #2 Right,Distal,Lateral Lower Leg o Topical Lidocaine 4% cream applied to wound bed prior to debridement (In Clinic Only). Skin Barriers/Peri-Wound Care Wound #2 Right,Distal,Lateral Lower Leg o Barrier cream o Moisturizing lotion Primary Wound Dressing Wound #2 Right,Distal,Lateral Lower Leg o Hydrafera Blue Secondary Dressing Wound #2 Right,Distal,Lateral Lower Leg o ABD pad o XtraSorb Dressing Change Frequency Wound #2 Right,Distal,Lateral Lower Leg o Change dressing every week Follow-up Appointments o Return  Appointment in 1 week. o Nurse Visit as needed Edema Control Wound #2 Right,Distal,Lateral Lower Leg o 4-Layer Compression System - Right Lower Extremity - unna to anchor Stryker, Kacie R. ([id number]) o Elevate legs to the level of the heart and pump ankles as often as possible Additional Orders / Instructions Wound #2 Right,Distal,Lateral Lower Leg o Vitamin A; Vitamin C, Zinc o Increase protein intake. o Other: - Please watch your salt (sodium) intake Electronic Signature(s) Signed: 02/04/2018 4:59:35 PM By: Renne Crigler Signed: 02/05/2018 12:14:27 AM By: Lenda Kelp PA-C Entered By: Renne Crigler on 02/04/2018 10:01:32 [name] ([id number]) -------------------------------------------------------------------------------- Problem List Details Patient Name: Jessica Casey R. Date of Service: 02/04/2018 9:15 AM Medical Record Number: [id number] Patient Account Number: [id number] Date of Birth/Sex: Mar 22, 1981 (37 y.o. Female) Treating RN: Renne Crigler Primary Care Provider: PATIENT, NO Other Clinician: Referring Provider: Referral, Self Treating Provider/Extender: Linwood Dibbles, HOYT Weeks in Treatment: 21 Active Problems ICD-10 Encounter Code Description Active Date Diagnosis L97.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d 09/10/2017 Yes I89.0 Lymphedema, not elsewhere classified 09/10/2017 Yes I87.311 Chronic venous hypertension (idiopathic) with ulcer of right lower 09/10/2017 Yes extremity E66.01 Morbid (severe) obesity due to excess calories 09/10/2017 Yes Inactive Problems Resolved Problems Electronic Signature(s) Signed: 02/05/2018 12:14:27 AM By: Lenda Kelp PA-C Entered By: Lenda Kelp on 02/04/2018 09:52:20 [name], [name] ([id number]) -------------------------------------------------------------------------------- Progress Note Details Patient Name: Jessica Casey R. Date of Service: 02/04/2018 9:15 AM Medical  Record Number: [id number] Patient Account Number: [id number] Date of Birth/Sex: 02/28/1981 (37 y.o. Female) Treating RN: Renne Crigler Primary Care Provider: PATIENT, NO Other Clinician: Referring Provider: Referral, Self Treating Provider/Extender: STONE III, HOYT Weeks in Treatment: 21 Subjective Chief Complaint Information obtained from Patient She is here in follow up evaluation of right lower extremity ulcers History of Present Illness (HPI) 37 year old patient well known to our [facility] wound care clinic where she has been seen since 2016 for bilateral lower extremity venous insufficiency disease with lymphedema and multiple ulcerations associated with morbid obesity. she had custom-made compression stockings and lymphedema pumps which were used in the past. most recently she was admitted to the hospital between October 11 and 09/02/2017 with sepsis, lower extremity wounds and lymphedema.she was initially treated  in the outpatient with Keflex and Bactrim. she was initially treated in the hospital with vancomycin and Zosyn and changed over to Unasyn until her white count improved and her blood cultures were negative for 3 days. After her inpatient management she was discharged home on Augmentin to end on 09/13/2017 with a 14 day course. she has had outpatient vascular duplex scans completed in November 2017 and her right ABI was 1.1 and the left ABI is 1.3. she had normal toe brachial indices bilaterally.she had three-vessel runoff in the right lower extremity and two-vessel runoff in the left lower extremity. On questioning the patient she does have custom made compression stockings and also has a lymphedema pump but has not been using it appropriately and has not been taking good care of herself. 09/17/2017 -- she returns today with compression stockings on the left side and the right side has had significant amount of drainage and has a very strong odor 09/24/2017 -- the  drainage is increased significantly and she has more lymphedema and a very strong odor to her wound. Though she does not have systemic symptoms, or overt infection I believe she will benefit from some doxycycline given empirically. 10/01/2017 -- after starting the doxycycline and changing the dressing twice a week her symptoms and signs have definitely improved overall. 10/08/2017 -- she has completed her course of doxycycline but continues to have a lot of drainage and needs twice a week dressing changes. 11/08/17-she is here in follow-up evaluation for right lower extremity ulcers. She admits to using her lymphedema pumps twice daily, one hour per session. she is voicing no complaints or concerns, no signs of infection [facility] 12/14/17 on evaluation today patient appears to be doing very well in regard to her wounds. She has been tolerating the dressing changes she continues to develop some portly the adherent granular tissue on the surface of the wound with some Slough. Obviously we are trying to get too much better wound bed. With that being said the hyper granulation the Hydrofera Blue Dressing to have helped with which is excellent news. However I think it may be time to try something a little bit different at this point. 01/11/18 on evaluation today patient appears to be doing fairly well in regard to her right lateral lower extremity ulcers. This shows excellent signs of filling in which is great news. There does not appear to be any evidence of infection which is also good news. She does continue to work as well is good school. She is having no pain. 01/22/18 on evaluation today patient appears to be doing a little bit worse in my opinion in regard to the overall quality of that [name], [name] ([id number]) granulation on her right lower extremity. She was not here last week due to being sick with a stomach virus this may have something to do with the fact that her wound appears  to be a little bit worse. With that being said I'm also thinking that after [facility] to the silver collagen would really has not looked that's good in my opinion. We may want to switch back. 02/04/18 on evaluation today patient appears to be doing excellent in regard to her right lateral lower from the ulcer. She has been tolerating the dressing changes without complication. With that being said she does continue to note improvement in regard to her wounds and I do feel the [facility] been excellent for her. She is having  no significant discomfort. Patient History Information obtained from Patient. Family History Kidney Disease - Mother, No family history of Cancer, Diabetes, Heart Disease, Hereditary Spherocytosis, Hypertension, Lung Disease, Seizures, Stroke, Thyroid Problems, Tuberculosis. Social History Never smoker, Marital Status - Married, Alcohol Use - Never, Drug Use - No History, Caffeine Use - Daily. Review of Systems (ROS) Constitutional Symptoms (General Health) Denies complaints or symptoms of Fever, Chills. Respiratory The patient has no complaints or symptoms. Cardiovascular Complains or has symptoms of LE edema. Psychiatric The patient has no complaints or symptoms. Objective Constitutional Obese and well-hydrated in no acute distress. Vitals Time Taken: 9:27 AM, Height: 74 in, Weight: 505 lbs, BMI: 64.8, Temperature: 97.6 F, Pulse: 97 bpm, Respiratory Rate: 18 breaths/min, Blood Pressure: 133/82 mmHg. Respiratory normal breathing without difficulty. Psychiatric this patient is able to make decisions and demonstrates good insight into disease process. Alert and Oriented x 3. pleasant and cooperative. General Notes: Currently patient's wound does show a good granular surface there is some Slough/biofilm noted on the surface of the wound which was sharply debrided away today and patient tolerated this without  complication. Overall I'm very pleased with how things are progressing. [name], [name] ([id number]) Integumentary (Hair, Skin) Wound #2 status is Open. Original cause of wound was Gradually Appeared. The wound is located on the Right,Distal,Lateral Lower Leg. The wound measures 1.5cm length x 4.2cm width x 0.2cm depth; 4.948cm^2 area and 0.99cm^3 volume. There is Fat Layer (Subcutaneous Tissue) Exposed exposed. There is no tunneling or undermining noted. There is a large amount of serosanguineous drainage noted. The wound margin is flat and intact. There is large (67-100%) red, hyper - granulation within the wound bed. There is a small (1-33%) amount of necrotic tissue within the wound bed including Adherent Slough. The periwound skin appearance had no abnormalities noted for color. The periwound skin appearance exhibited: Scarring. The periwound skin appearance did not exhibit: Callus, Crepitus, Excoriation, Induration, Rash, Dry/Scaly, Maceration. Periwound temperature was noted as No Abnormality. The periwound has tenderness on palpation. Assessment Active Problems ICD-10 L97.212 - 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d I89.0 - Lymphedema, not elsewhere classified I87.311 - Chronic venous hypertension (idiopathic) with ulcer of right lower extremity E66.01 - Morbid (severe) obesity due to excess calories Procedures Wound #2 Pre-procedure diagnosis of Wound #2 is a Lymphedema located on the Right,Distal,Lateral Lower Leg . There was a Skin/Subcutaneous Tissue Debridement ([id number]) debridement with total area of 6.3 sq cm performed by STONE III, HOYT E., PA-C. with the following instrument(s): Curette to remove Viable and Non-Viable tissue/material including Fibrin/Slough and Subcutaneous after achieving pain control using Other (lidocaine 4%). A time out was conducted at 09:56, prior to the start of the procedure. A Minimum amount of bleeding was controlled  with Pressure. The procedure was tolerated well with a pain level of 0 throughout and a pain level of 0 following the procedure. Post Debridement Measurements: 1.5cm length x 4.2cm width x 0.2cm depth; 0.99cm^3 volume. Character of Wound/Ulcer Post Debridement is stable. Post procedure Diagnosis Wound #2: Same as Pre-Procedure Plan Wound Cleansing: Wound #2 Right,Distal,Lateral Lower Leg: Clean wound with Normal Saline. Cleanse wound with mild soap and water Anesthetic (add to Medication List): Wound #2 Right,Distal,Lateral Lower Leg: Topical Lidocaine 4% cream applied to wound bed prior to debridement (In Clinic Only). Skin Barriers/Peri-Wound Care: Wound #2 Right,Distal,Lateral Lower Leg: [name], [name] ([id number]) Barrier cream Moisturizing lotion Primary Wound Dressing: Wound #2 Right,Distal,Lateral Lower Leg: Hydrafera Blue Secondary Dressing: Wound #2  Right,Distal,Lateral Lower Leg: ABD pad XtraSorb Dressing Change Frequency: Wound #2 Right,Distal,Lateral Lower Leg: Change dressing every week Follow-up Appointments: Return Appointment in 1 week. Nurse Visit as needed Edema Control: Wound #2 Right,Distal,Lateral Lower Leg: 4-Layer Compression System - Right Lower Extremity - unna to anchor Elevate legs to the level of the heart and pump ankles as often as possible Additional Orders / Instructions: Wound #2 Right,Distal,Lateral Lower Leg: Vitamin A; Vitamin C, Zinc Increase protein intake. Other: - Please watch your salt (sodium) intake I am going to recommend at this time that we continue with the hatch affair blue since this seems to be doing so well for her. Patient is in agreement with plan. We will see were things stand in follow-up. Please see above for specific wound care orders. We will see patient for re-evaluation in 1 week(s) here in the clinic. If anything worsens or changes patient will contact our office for additional recommendations. Electronic  Signature(s) Signed: 02/05/2018 12:14:27 AM By: Lenda Kelp PA-C Entered By: Lenda Kelp on 02/04/2018 10:02:41 [name] ([id number]) -------------------------------------------------------------------------------- ROS/PFSH Details Patient Name: Jessica Casey R. Date of Service: 02/04/2018 9:15 AM Medical Record Number: [id number] Patient Account Number: [id number] Date of Birth/Sex: 1980-12-19 (37 y.o. Female) Treating RN: Renne Crigler Primary Care Provider: PATIENT, NO Other Clinician: Referring Provider: Referral, Self Treating Provider/Extender: STONE III, HOYT Weeks in Treatment: 21 Information Obtained From Patient Wound History Do you currently have one or more open woundso Yes How many open wounds do you currently haveo 1 Approximately how long have you had your woundso 1 month How have you been treating your wound(s) until nowo aquacel ag Has your wound(s) ever healed and then re-openedo No Have you had any lab work done in the past montho No Have you tested positive for an antibiotic resistant organism (MRSA, VRE)o No Have you tested positive for osteomyelitis (bone infection)o No Have you had any tests for circulation on your legso Yes Who ordered the testo [facility] Where was the test doneo [facility] Constitutional Symptoms (General Health) Complaints and Symptoms: Negative for: Fever; Chills Cardiovascular Complaints and Symptoms: Positive for: LE edema Medical History: Positive for: Hypertension Negative for: Angina; Arrhythmia; Congestive Heart Failure; Coronary Artery Disease; Deep Vein Thrombosis; Hypotension; Myocardial Infarction; Peripheral Arterial Disease; Peripheral Venous Disease; Phlebitis; Vasculitis Hematologic/Lymphatic Medical History: Positive for: Lymphedema Respiratory Complaints and Symptoms: No Complaints or Symptoms Medical History: Negative for: Aspiration; Asthma; Chronic Obstructive Pulmonary Disease (COPD); Pneumothorax; Sleep  Apnea; Tuberculosis Psychiatric Complaints and Symptoms: No Complaints or Symptoms [name], [name] ([id number]) Immunizations Pneumococcal Vaccine: Received Pneumococcal Vaccination: No Immunization Notes: up to date Implantable Devices Family and Social History Cancer: No; Diabetes: No; Heart Disease: No; Hereditary Spherocytosis: No; Hypertension: No; Kidney Disease: Yes - Mother; Lung Disease: No; Seizures: No; Stroke: No; Thyroid Problems: No; Tuberculosis: No; Never smoker; Marital Status - Married; Alcohol Use: Never; Drug Use: No History; Caffeine Use: Daily; Financial Concerns: No; Food, Clothing or Shelter Needs: No; Support System Lacking: No; Transportation Concerns: No; Advanced Directives: No; Patient does not want information on Advanced Directives Physician Affirmation I have reviewed and agree with the above information. Electronic Signature(s) Signed: 02/04/2018 4:59:35 PM By: Renne Crigler Signed: 02/05/2018 12:14:27 AM By: Lenda Kelp PA-C Entered By: Lenda Kelp on 02/04/2018 10:00:29 [name] ([id number]) -------------------------------------------------------------------------------- SuperBill Details Patient Name: Jessica Casey R. Date of Service: 02/04/2018 Medical Record Number: [id number] Patient Account Number: [id number] Date of Birth/Sex: 01/16/1981 (37 y.o. Female) Treating RN: Flinchum,  [facility] Primary Care Provider: PATIENT, NO Other Clinician: Referring Provider: Referral, Self Treating Provider/Extender: STONE III, HOYT Weeks in Treatment: 21 Diagnosis Coding ICD-10 Codes Code Description [phone] 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d I89.0 Lymphedema, not elsewhere classified I87.311 Chronic venous hypertension (idiopathic) with ulcer of right lower extremity E66.01 Morbid (severe) obesity due to excess calories Facility Procedures CPT4 Code: [id number] Description: 11042 - DEB SUBQ TISSUE 20 SQ CM/<  ICD-10 Diagnosis Description L97.212 Non-pressure chronic ulcer of right calf with fat layer expo Modifier: sed Quantity: 1 Physician Procedures CPT4 Code: [id number] Description: 11042 - WC PHYS SUBQ TISS 20 SQ CM ICD-10 Diagnosis Description L97.212 Non-pressure chronic ulcer of right calf with fat layer expo Modifier: sed Quantity: 1 Electronic Signature(s) Signed: 02/05/2018 12:14:27 AM By: Lenda Kelp PA-C Entered By: Lenda Kelp on 02/04/2018 10:02:04

## 2018-02-05 NOTE — Progress Notes (Signed)
KENSLIE, ABBRUZZESE ([id number]) Visit Report for 02/04/2018 Arrival Information Details Patient Name: [name], [name] Date of Service: 02/04/2018 9:15 AM Medical Record Number: [id number] Patient Account Number: [id number] Date of Birth/Sex: 03/07/1981 (37 y.o. Female) Treating RN: Huel Coventry Primary Care Vala Raffo: PATIENT, NO Other Clinician: Referring Masaye Gatchalian: Referral, Self Treating Ananias Kolander/Extender: STONE III, HOYT Weeks in Treatment: 21 Visit Information History Since Last Visit Added or deleted any medications: No Patient Arrived: Ambulatory Any new allergies or adverse reactions: No Arrival Time: 09:27 Had a fall or experienced change in No Accompanied By: self activities of daily living that may affect Transfer Assistance: None risk of falls: Patient Identification Verified: Yes Signs or symptoms of abuse/neglect since last visito No Secondary Verification Process Completed: Yes Hospitalized since last visit: No Patient Requires Transmission-Based No Has Dressing in Place as Prescribed: Yes Precautions: Has Compression in Place as Prescribed: Yes Patient Has Alerts: No Pain Present Now: No Electronic Signature(s) Signed: 02/04/2018 11:38:11 AM By: Elliot Gurney, BSN, RN, CWS, Kim RN, BSN Entered By: Elliot Gurney, BSN, RN, CWS, Kim on 02/04/2018 09:27:19 [name] ([id number]) -------------------------------------------------------------------------------- Encounter Discharge Information Details Patient Name: Jessica Casey R. Date of Service: 02/04/2018 9:15 AM Medical Record Number: [id number] Patient Account Number: [id number] Date of Birth/Sex: [birth year] (37 y.o. Female) Treating RN: Renne Crigler Primary Care Jarry Manon: PATIENT, NO Other Clinician: Referring Marci Polito: Referral, Self Treating Dayten Juba/Extender: STONE III, HOYT Weeks in Treatment: 21 Encounter Discharge Information Items Discharge Pain Level: 0 Discharge Condition: Stable Ambulatory Status:  Ambulatory Discharge Destination: Home Transportation: Private Auto Accompanied By: self Schedule Follow-up Appointment: Yes Medication Reconciliation completed and No provided to Patient/Care Wileen Duncanson: Patient Clinical Summary of Care: Declined Electronic Signature(s) Signed: 02/04/2018 10:25:15 AM By: Curtis Sites Entered By: Curtis Sites on 02/04/2018 10:25:14 [name], [name] ([id number]) -------------------------------------------------------------------------------- Lower Extremity Assessment Details Patient Name: Jessica Casey R. Date of Service: 02/04/2018 9:15 AM Medical Record Number: [id number] Patient Account Number: [id number] Date of Birth/Sex: May 24, 1981 (37 y.o. Female) Treating RN: Huel Coventry Primary Care Nakenya Theall: PATIENT, NO Other Clinician: Referring Willona Phariss: Referral, Self Treating Abigail Marsiglia/Extender: STONE III, HOYT Weeks in Treatment: 21 Edema Assessment Assessed: [Left: No] [Right: No] [Left: Edema] [Right: :] Calf Left: Right: Point of Measurement: 35 cm From Medial Instep cm 58.4 cm Ankle Left: Right: Point of Measurement: 9 cm From Medial Instep cm 36.4 cm Vascular Assessment Claudication: Claudication Assessment [Right:None] Pulses: Dorsalis Pedis Palpable: [Right:Yes] Posterior Tibial Extremity colors, hair growth, and conditions: Extremity Color: [Right:Hyperpigmented] Hair Growth on Extremity: [Right:No] Temperature of Extremity: [Right:Warm] Capillary Refill: [Right:< 3 seconds] Toe Nail Assessment Left: Right: Thick: Yes Discolored: Yes Deformed: Yes Improper Length and Hygiene: Yes Electronic Signature(s) Signed: 02/04/2018 11:38:11 AM By: Elliot Gurney, BSN, RN, CWS, Kim RN, BSN Entered By: Elliot Gurney, BSN, RN, CWS, Kim on 02/04/2018 09:35:52 [name], [name] ([id number]) -------------------------------------------------------------------------------- Multi Wound Chart Details Patient Name: Jessica Casey R. Date of Service: 02/04/2018 9:15  AM Medical Record Number: [id number] Patient Account Number: [id number] Date of Birth/Sex: 1981/10/22 (37 y.o. Female) Treating RN: Renne Crigler Primary Care Zaynab Chipman: PATIENT, NO Other Clinician: Referring Quetzaly Ebner: Referral, Self Treating Bebe Moncure/Extender: STONE III, HOYT Weeks in Treatment: 21 Vital Signs Height(in): 74 Pulse(bpm): 97 Weight(lbs): 505 Blood Pressure(mmHg): 133/82 Body Mass Index(BMI): 65 Temperature(F): 97.6 Respiratory Rate 18 (breaths/min): Photos: [2:No Photos] [N/A:N/A] Wound Location: [2:Right Lower Leg - Lateral, Distal] [N/A:N/A] Wounding Event: [2:Gradually Appeared] [N/A:N/A] Primary Etiology: [2:Lymphedema] [N/A:N/A] Comorbid History: [2:Lymphedema, Hypertension] [N/A:N/A] Date Acquired: [2:08/08/2017] [N/A:N/A] Weeks of Treatment: [2:21] [N/A:N/A] Wound Status: [2:Open] [N/A:N/A] Clustered Wound: [2:Yes] [  N/A:N/A] Clustered Quantity: [2:2] [N/A:N/A] Measurements L x W x D [2:1.5x4.2x0.2] [N/A:N/A] (cm) Area (cm) : [2:4.948] [N/A:N/A] Volume (cm) : [2:0.99] [N/A:N/A] % Reduction in Area: [2:76.90%] [N/A:N/A] % Reduction in Volume: [2:84.60%] [N/A:N/A] Classification: [2:Full Thickness With Exposed Support Structures] [N/A:N/A] Exudate Amount: [2:Large] [N/A:N/A] Exudate Type: [2:Serosanguineous] [N/A:N/A] Exudate Color: [2:red, brown] [N/A:N/A] Wound Margin: [2:Flat and Intact] [N/A:N/A] Granulation Amount: [2:Large (67-100%)] [N/A:N/A] Granulation Quality: [2:Red, Hyper-granulation] [N/A:N/A] Necrotic Amount: [2:Small (1-33%)] [N/A:N/A] Exposed Structures: [2:Fat Layer (Subcutaneous Tissue) Exposed: Yes Fascia: No Tendon: No Muscle: No Joint: No Bone: No] [N/A:N/A] Epithelialization: [2:Small (1-33%)] [N/A:N/A] Periwound Skin Texture: [2:Scarring: Yes Excoriation: No Induration: No] [N/A:N/A] Callus: No Crepitus: No Rash: No Periwound Skin Moisture: Maceration: No N/A N/A Dry/Scaly: No Periwound Skin Color: Atrophie Blanche:  No N/A N/A Cyanosis: No Ecchymosis: No Erythema: No Hemosiderin Staining: No Mottled: No Pallor: No Rubor: No Temperature: No Abnormality N/A N/A Tenderness on Palpation: Yes N/A N/A Wound Preparation: Ulcer Cleansing: N/A N/A Rinsed/Irrigated with Saline, Other: soap and water Topical Anesthetic Applied: Other: lidocaine 4% Treatment Notes Electronic Signature(s) Signed: 02/04/2018 4:59:35 PM By: Renne Crigler Entered By: Renne Crigler on 02/04/2018 09:55:31 [name], [name] Kitchen ([id number]) -------------------------------------------------------------------------------- Multi-Disciplinary Care Plan Details Patient Name: Jessica Casey R. Date of Service: 02/04/2018 9:15 AM Medical Record Number: [id number] Patient Account Number: [id number] Date of Birth/Sex: 09-May-1981 (37 y.o. Female) Treating RN: Renne Crigler Primary Care Colinda Barth: PATIENT, NO Other Clinician: Referring Satara Virella: Referral, Self Treating Makhari Dovidio/Extender: STONE III, HOYT Weeks in Treatment: 21 Active Inactive ` Orientation to the Wound Care Program Nursing Diagnoses: Knowledge deficit related to the wound healing center program Goals: Patient/caregiver will verbalize understanding of the Wound Healing Center Program Date Initiated: 09/10/2017 Target Resolution Date: 11/23/2017 Goal Status: Active Interventions: Provide education on orientation to the wound center Notes: ` Venous Leg Ulcer Nursing Diagnoses: Knowledge deficit related to disease process and management Goals: Patient/caregiver will verbalize understanding of disease process and disease management Date Initiated: 09/10/2017 Target Resolution Date: 11/23/2017 Goal Status: Active Interventions: Assess peripheral edema status every visit. Notes: ` Wound/Skin Impairment Nursing Diagnoses: Impaired tissue integrity Goals: Ulcer/skin breakdown will heal within 14 weeks Date Initiated: 09/10/2017 Target Resolution Date:  11/24/2017 Goal Status: Active Interventions: [name], [name] ([id number]) Assess patient/caregiver ability to obtain necessary supplies Assess patient/caregiver ability to perform ulcer/skin care regimen upon admission and as needed Assess ulceration(s) every visit Notes: Electronic Signature(s) Signed: 02/04/2018 4:59:35 PM By: Renne Crigler Entered By: Renne Crigler on 02/04/2018 09:55:23 [name], [name] ([id number]) -------------------------------------------------------------------------------- Pain Assessment Details Patient Name: Jessica Casey R. Date of Service: 02/04/2018 9:15 AM Medical Record Number: [id number] Patient Account Number: [id number] Date of Birth/Sex: 06-05-1981 (37 y.o. Female) Treating RN: Huel Coventry Primary Care Bella Brummet: PATIENT, NO Other Clinician: Referring Daielle Melcher: Referral, Self Treating Telesia Ates/Extender: STONE III, HOYT Weeks in Treatment: 21 Active Problems Location of Pain Severity and Description of Pain Patient Has Paino No Site Locations With Dressing Change: No Pain Management and Medication Current Pain Management: Goals for Pain Management Topical or injectable lidocaine is offered to patient for acute pain when surgical debridement is performed. If needed, Patient is instructed to use over the counter pain medication for the following 24-48 hours after debridement. Wound care MDs do not prescribed pain medications. Patient has chronic pain or uncontrolled pain. Patient has been instructed to make an appointment with their Primary Care Physician for pain management. Electronic Signature(s) Signed: 02/04/2018 11:38:11 AM By: Elliot Gurney, BSN, RN, CWS, Kim RN, BSN Entered By: Elliot Gurney, BSN, RN, CWS, Kim  on 02/04/2018 09:27:28 [name], [name] ([id number]) -------------------------------------------------------------------------------- Patient/Caregiver Education Details Patient Name: TERRIONA, HORLACHER R. Date of Service: 02/04/2018 9:15 AM Medical  Record Number: [id number] Patient Account Number: [id number] Date of Birth/Gender: May 21, 1981 (37 y.o. Female) Treating RN: Curtis Sites Primary Care Physician: PATIENT, NO Other Clinician: Referring Physician: Referral, Self Treating Physician/Extender: Linwood Dibbles, HOYT Weeks in Treatment: 21 Education Assessment Education Provided To: Patient Education Topics Provided Venous: Handouts: Other: continue compression on left leg Methods: Explain/Verbal Responses: State content correctly Electronic Signature(s) Signed: 02/04/2018 4:59:43 PM By: Curtis Sites Entered By: Curtis Sites on 02/04/2018 10:25:36 [name], [name] ([id number]) -------------------------------------------------------------------------------- Wound Assessment Details Patient Name: Jessica Casey R. Date of Service: 02/04/2018 9:15 AM Medical Record Number: [id number] Patient Account Number: [id number] Date of Birth/Sex: 1981/03/04 (37 y.o. Female) Treating RN: Huel Coventry Primary Care Halsey Persaud: PATIENT, NO Other Clinician: Referring Hagan Vanauken: Referral, Self Treating Versia Mignogna/Extender: STONE III, HOYT Weeks in Treatment: 21 Wound Status Wound Number: 2 Primary Etiology: Lymphedema Wound Location: Right Lower Leg - Lateral, Distal Wound Status: Open Wounding Event: Gradually Appeared Comorbid History: Lymphedema, Hypertension Date Acquired: 08/08/2017 Weeks Of Treatment: 21 Clustered Wound: Yes Photos Photo Uploaded By: Elliot Gurney, BSN, RN, CWS, Kim on 02/04/2018 12:58:08 Wound Measurements Length: (cm) 1.5 % Reduct Width: (cm) 4.2 % Reduct Depth: (cm) 0.2 Epitheli Clustered Quantity: 2 Tunnelin Area: (cm) 4.948 Undermi Volume: (cm) 0.99 ion in Area: 76.9% ion in Volume: 84.6% alization: Small (1-33%) g: No ning: No Wound Description Full Thickness With Exposed Support Foul Odo Classification: Structures Slough/F Wound Margin: Flat and Intact Exudate Large Amount: Exudate Type:  Serosanguineous Exudate Color: red, brown r After Cleansing: No ibrino Yes Wound Bed Granulation Amount: Large (67-100%) Exposed Structure Granulation Quality: Red, Hyper-granulation Fascia Exposed: No Necrotic Amount: Small (1-33%) Fat Layer (Subcutaneous Tissue) Exposed: Yes Necrotic Quality: Adherent Slough Tendon Exposed: No Muscle Exposed: No Joint Exposed: No Bone Exposed: No Periwound Skin Texture [name], [name] ([id number]) Texture Color No Abnormalities Noted: No No Abnormalities Noted: Yes Callus: No Temperature / Pain Crepitus: No Temperature: No Abnormality Excoriation: No Tenderness on Palpation: Yes Induration: No Rash: No Scarring: Yes Moisture No Abnormalities Noted: No Dry / Scaly: No Maceration: No Wound Preparation Ulcer Cleansing: Rinsed/Irrigated with Saline, Other: soap and water, Topical Anesthetic Applied: Other: lidocaine 4%, Treatment Notes Wound #2 (Right, Distal, Lateral Lower Leg) 1. Cleansed with: Clean wound with Normal Saline Cleanse wound with antibacterial soap and water 2. Anesthetic Topical Lidocaine 4% cream to wound bed prior to debridement 3. Peri-wound Care: Moisturizing lotion 4. Dressing Applied: Hydrafera Blue Other dressing (specify in notes) 5. Secondary Dressing Applied ABD Pad 7. Secured with 4-Layer Compression System - Right Lower Extremity Notes xtrasorb, unna to Ecologist) Signed: 02/04/2018 11:38:11 AM By: Elliot Gurney, BSN, RN, CWS, Kim RN, BSN Entered By: Elliot Gurney, BSN, RN, CWS, Kim on 02/04/2018 09:32:44 [name] ([id number]) -------------------------------------------------------------------------------- Vitals Details Patient Name: Jessica Casey R. Date of Service: 02/04/2018 9:15 AM Medical Record Number: [id number] Patient Account Number: [id number] Date of Birth/Sex: 04-24-1981 (37 y.o. Female) Treating RN: Huel Coventry Primary Care Manvir Prabhu: PATIENT, NO Other Clinician: Referring  Huberta Tompkins: Referral, Self Treating Shraddha Lebron/Extender: STONE III, HOYT Weeks in Treatment: 21 Vital Signs Time Taken: 09:27 Temperature (F): 97.6 Height (in): 74 Pulse (bpm): 97 Weight (lbs): 505 Respiratory Rate (breaths/min): 18 Body Mass Index (BMI): 64.8 Blood Pressure (mmHg): 133/82 Reference Range: 80 - 120 mg / dl Electronic Signature(s) Signed: 02/04/2018 11:38:11 AM By: Elliot Gurney, BSN, RN, CWS, Kim RN, BSN Entered By:  Elliot GurneyWoody, BSN, RN, CWS, Kim on 02/04/2018 09:27:58

## 2018-02-11 ENCOUNTER — Encounter: Payer: Self-pay | Admitting: Physician Assistant

## 2018-02-14 NOTE — Progress Notes (Signed)
Jessica, [name] ([id number]) Visit Report for 02/11/2018 Chief Complaint Document Details Patient Name: [name], [name] Date of Service: 02/11/2018 9:15 AM Medical Record Number: [id number] Patient Account Number: [id number] Date of Birth/Sex: 1981/05/12 (37 y.o. F) Treating RN: Renne Crigler Primary Care Provider: PATIENT, NO Other Clinician: Referring Provider: Referral, Self Treating Provider/Extender: STONE III, [name] in Treatment: 22 Information Obtained from: Patient Chief Complaint She is here in follow up evaluation of right lower extremity ulcers Electronic Signature(s) Signed: 02/12/2018 12:16:15 AM By: Lenda Kelp PA-C Entered By: Lenda Kelp on 02/11/2018 23:06:39 [name], [name] ([id number]) -------------------------------------------------------------------------------- Debridement Details Patient Name: [name] Date of Service: 02/11/2018 9:15 AM Medical Record Number: [id number] Patient Account Number: [id number] Date of Birth/Sex: March 17, 1981 (37 y.o. F) Treating RN: Renne Crigler Primary Care Provider: PATIENT, NO Other Clinician: Referring Provider: Referral, Self Treating Provider/Extender: STONE III, Jahliyah Trice [name] in Treatment: 22 Debridement Performed for Wound #2 Right,Distal,Lateral Lower Leg Assessment: Performed By: Physician STONE III, Pranish Akhavan E., PA-C Debridement Type: Debridement Pre-procedure Verification/Time Yes - 09:48 Out Taken: Start Time: 09:48 Pain Control: Other : lidocaine 4% Total Area Debrided (L x W): 2.3 (cm) x 4 (cm) = 9.2 (cm) Tissue and other material Viable, Non-Viable, Slough, Subcutaneous, Biofilm, Slough debrided: Level: Skin/Subcutaneous Tissue Debridement Description: Excisional Instrument: Curette Bleeding: Minimum Hemostasis Achieved: Pressure End Time: 09:49 Procedural Pain: 0 Post Procedural Pain: 0 Response to Treatment: Procedure was tolerated well Post Debridement Measurements of Total  Wound Length: (cm) 2.3 Width: (cm) 4 Depth: (cm) 0.2 Volume: (cm) 1.445 Character of Wound/Ulcer Post Debridement: Stable Post Procedure Diagnosis Same as Pre-procedure Electronic Signature(s) Signed: 02/11/2018 4:23:05 PM By: Renne Crigler Signed: 02/12/2018 12:16:15 AM By: Lenda Kelp PA-C Entered By: Renne Crigler on 02/11/2018 09:50:44 [name], [name] ([id number]) -------------------------------------------------------------------------------- HPI Details Patient Name: [name] Date of Service: 02/11/2018 9:15 AM Medical Record Number: [id number] Patient Account Number: [id number] Date of Birth/Sex: August 12, 1981 (37 y.o. F) Treating RN: Renne Crigler Primary Care Provider: PATIENT, NO Other Clinician: Referring Provider: Referral, Self Treating Provider/Extender: STONE III, Saidy Ormand [name] in Treatment: 22 History of Present Illness HPI Description: 37 year old patient well known to our [facility] wound care clinic where she has been seen since 2016 for bilateral lower extremity venous insufficiency disease with lymphedema and multiple ulcerations associated with morbid obesity. she had custom-made compression stockings and lymphedema pumps which were used in the past. most recently she was admitted to the hospital between October 11 and 09/02/2017 with sepsis, lower extremity wounds and lymphedema.she was initially treated in the outpatient with Keflex and Bactrim. she was initially treated in the hospital with vancomycin and Zosyn and changed over to Unasyn until her white count improved and her blood cultures were negative for 3 days. After her inpatient management she was discharged home on Augmentin to end on 09/13/2017 with a 14 day course. she has had outpatient vascular duplex scans completed in November 2017 and her right ABI was 1.1 and the left ABI is 1.3. she had normal toe brachial indices bilaterally.she had three-vessel runoff in the right lower extremity  and two-vessel runoff in the left lower extremity. On questioning the patient she does have custom made compression stockings and also has a lymphedema pump but has not been using it appropriately and has not been taking good care of herself. 09/17/2017 -- she returns today with compression stockings on the left side and the right side has had significant amount of drainage and has a very strong odor  09/24/2017 -- the drainage is increased significantly and she has more lymphedema and a very strong odor to her wound. Though she does not have systemic symptoms, or overt infection I believe she will benefit from some doxycycline given empirically. 10/01/2017 -- after starting the doxycycline and changing the dressing twice a week her symptoms and signs have definitely improved overall. 10/08/2017 -- she has completed her course of doxycycline but continues to have a lot of drainage and needs twice a week dressing changes. 11/08/17-she is here in follow-up evaluation for right lower extremity ulcers. She admits to using her lymphedema pumps twice daily, one hour per session. she is voicing no complaints or concerns, no signs of infection will change to [facility] 12/14/17 on evaluation today patient appears to be doing very well in regard to her wounds. She has been tolerating the dressing changes she continues to develop some portly the adherent granular tissue on the surface of the wound with some Slough. Obviously we are trying to get too much better wound bed. With that being said the hyper granulation the Hydrofera Blue Dressing to have helped with which is excellent news. However I think it may be time to try something a little bit different at this point. 01/11/18 on evaluation today patient appears to be doing fairly well in regard to her right lateral lower extremity ulcers. This shows excellent signs of filling in which is great news. There does not appear to be any evidence of infection which  is also good news. She does continue to work as well is good school. She is having no pain. 01/22/18 on evaluation today patient appears to be doing a little bit worse in my opinion in regard to the overall quality of that granulation on her right lower extremity. She was not here last week due to being [name] with a stomach virus this may have something to do with the fact that her wound appears to be a little bit worse. With that being said I'm also thinking that after switching from the [facility] Dressing to the silver collagen would really has not looked that's good in my opinion. We may want to swit 02/11/18 on evaluation today patient's right lower/lateral lower extremity ulcers appear to be doing very well at this point. Gretzinger, [name] ([id number]) Especially the more proximal ulcer has filled in much closer to surface which is good news. Nonetheless both show signs of improvement which is great news. There does not appear to be any evidence of infection which is also good news. In general patient has been doing well tolerating the wraps as well as the [employer]. Electronic Signature(s) Signed: 02/12/2018 12:16:15 AM By: Lenda Kelp PA-C Entered By: Lenda Kelp on 02/11/2018 23:07:06 [name] ([id number]) -------------------------------------------------------------------------------- Physical Exam Details Patient Name: [name] Date of Service: 02/11/2018 9:15 AM Medical Record Number: [id number] Patient Account Number: [id number] Date of Birth/Sex: January 30, 1981 (37 y.o. F) Treating RN: Renne Crigler Primary Care Provider: PATIENT, NO Other Clinician: Referring Provider: Referral, Self Treating Provider/Extender: STONE III, Aracelie Addis [name] in Treatment: 22 Constitutional Obese and well-hydrated in no acute distress. Respiratory normal breathing without difficulty. Cardiovascular 2+ pitting edema of the bilateral lower extremities. Psychiatric this  patient is able to make decisions and demonstrates good insight into disease process. Alert and Oriented x 3. pleasant and cooperative. Notes Patient's wound did show granulation with some Slough and biofilm covering the surface. This was removed today with good result and post debridement  the wound bed appear to be doing much better. At this time I'm gonna suggest that we continue with the current wound care measures and she seems to be doing so well. Electronic Signature(s) Signed: 02/12/2018 12:16:15 AM By: Lenda KelpStone III, Traniece Boffa PA-C Entered By: Lenda KelpStone III, Josia Cueva on 02/11/2018 23:07:57 Jessica HindLEATH, [name] ([id number]) -------------------------------------------------------------------------------- Physician Orders Details Patient Name: [name] CaseyLEATH, [name] Date of Service: 02/11/2018 9:15 AM Medical Record Number: [id number] Patient Account Number: [id number] Date of Birth/Sex: [birth year] (37 y.o. F) Treating RN: Renne CriglerFlinchum, Cheryl Primary Care Provider: PATIENT, NO Other Clinician: Referring Provider: Referral, Self Treating Provider/Extender: STONE III, Eufemio Strahm [name] in Treatment: 22 Verbal / Phone Orders: No Diagnosis Coding Wound Cleansing Wound #2 Right,Distal,Lateral Lower Leg o Clean wound with Normal Saline. o Cleanse wound with mild soap and water Anesthetic (add to Medication List) Wound #2 Right,Distal,Lateral Lower Leg o Topical Lidocaine 4% cream applied to wound bed prior to debridement (In Clinic Only). Skin Barriers/Peri-Wound Care Wound #2 Right,Distal,Lateral Lower Leg o Barrier cream o Moisturizing lotion Secondary Dressing Wound #2 Right,Distal,Lateral Lower Leg o ABD pad o XtraSorb Dressing Change Frequency Wound #2 Right,Distal,Lateral Lower Leg o Change dressing every week Follow-up Appointments o Return Appointment in 1 week. o Nurse Visit as needed Edema Control Wound #2 Right,Distal,Lateral Lower Leg o 4-Layer Compression System - Right Lower  Extremity - unna to anchor o Elevate legs to the level of the heart and pump ankles as often as possible Additional Orders / Instructions Wound #2 Right,Distal,Lateral Lower Leg o Vitamin A; Vitamin C, Zinc o Increase protein intake. o Other: - Please watch your salt (sodium) intake Electronic Signature(s) Signed: 02/11/2018 4:23:05 PM By: Renne CriglerFlinchum, Cheryl Signed: 02/12/2018 12:16:15 AM By: Josephina ShihStone III, Fina Heizer PA-C [name], [name] ([id number]) Entered By: Renne CriglerFlinchum, Cheryl on 02/11/2018 09:57:22 Hang, Perlie Marland Kitchen. ([id number]) -------------------------------------------------------------------------------- Problem List Details Patient Name: [name] CaseyLEATH, Trease R. Date of Service: 02/11/2018 9:15 AM Medical Record Number: [id number] Patient Account Number: [id number] Date of Birth/Sex: [birth year] (37 y.o. F) Treating RN: Renne CriglerFlinchum, Cheryl Primary Care Provider: PATIENT, NO Other Clinician: Referring Provider: Referral, Self Treating Provider/Extender: Linwood DibblesSTONE III, Ariellah Faust [name] in Treatment: 22 Active Problems ICD-10 Impacting Encounter Code Description Active Date Wound Healing Diagnosis L97.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d 09/10/2017 Yes I89.0 Lymphedema, not elsewhere classified 09/10/2017 Yes I87.311 Chronic venous hypertension (idiopathic) with ulcer of right 09/10/2017 Yes lower extremity E66.01 Morbid (severe) obesity due to excess calories 09/10/2017 Yes Inactive Problems Resolved Problems Electronic Signature(s) Signed: 02/12/2018 12:16:15 AM By: Lenda KelpStone III, Ivi Griffith PA-C Entered By: Lenda KelpStone III, Latishia Suitt on 02/11/2018 23:06:32 [name], [name] ([id number]) -------------------------------------------------------------------------------- Progress Note Details Patient Name: [name] CaseyLEATH, Hildagard R. Date of Service: 02/11/2018 9:15 AM Medical Record Number: [id number] Patient Account Number: [id number] Date of Birth/Sex: [birth year] (37 y.o. F) Treating RN: Renne CriglerFlinchum,  Cheryl Primary Care Provider: PATIENT, NO Other Clinician: Referring Provider: Referral, Self Treating Provider/Extender: STONE III, Natalin Bible [name] in Treatment: 22 Subjective Chief Complaint Information obtained from Patient She is here in follow up evaluation of right lower extremity ulcers History of Present Illness (HPI) 37 year old patient [facility] she has been seen since 2016 for bilateral lower extremity venous insufficiency disease with lymphedema and multiple ulcerations associated with morbid obesity. she had custom-made compression stockings and lymphedema pumps which were used in the past. most recently she was admitted to the hospital between October 11 and 09/02/2017 with sepsis, lower extremity wounds and lymphedema.she was initially treated in the outpatient with  Keflex and Bactrim. she was initially treated in the hospital with vancomycin and Zosyn and changed over to Unasyn until her white count improved and her blood cultures were negative for 3 days. After her inpatient management she was discharged home on Augmentin to end on 09/13/2017 with a 14 day course. she has had outpatient vascular duplex scans completed in November 2017 and her right ABI was 1.1 and the left ABI is 1.3. she had normal toe brachial indices bilaterally.she had three-vessel runoff in the right lower extremity and two-vessel runoff in the left lower extremity. On questioning the patient she does have custom made compression stockings and also has a lymphedema pump but has not been using it appropriately and has not been taking good care of herself. 09/17/2017 -- she returns today with compression stockings on the left side and the right side has had significant amount of drainage and has a very strong odor 09/24/2017 -- the drainage is increased significantly and she has more lymphedema and a very strong odor to her wound. Though she does not have systemic  symptoms, or overt infection I believe she will benefit from some doxycycline given empirically. 10/01/2017 -- after starting the doxycycline and changing the dressing twice a week her symptoms and signs have definitely improved overall. 10/08/2017 -- she has completed her course of doxycycline but continues to have a lot of drainage and needs twice a week dressing changes. 11/08/17-she is here in follow-up evaluation for right lower extremity ulcers. She admits to using her lymphedema pumps twice daily, one hour per session. she is voicing no complaints or concerns, no signs of infection will change to [facility] 12/14/17 on evaluation today patient appears to be doing very well in regard to her wounds. She has been tolerating the dressing changes she continues to develop some portly the adherent granular tissue on the surface of the wound with some Slough. Obviously we are trying to get too much better wound bed. With that being said the hyper granulation the Hydrofera Blue Dressing to have helped with which is excellent news. However I think it may be time to try something a little bit different at this point. 01/11/18 on evaluation today patient appears to be doing fairly well in regard to her right lateral lower extremity ulcers. This shows excellent signs of filling in which is great news. There does not appear to be any evidence of infection which is also good news. She does continue to work as well is good school. She is having no pain. 01/22/18 on evaluation today patient appears to be doing a little bit worse in my opinion in regard to the overall quality of that [name], [name] ([id number]) granulation on her right lower extremity. She was not here last week due to being [name] with a stomach virus this may have something to do with the fact that her wound appears to be a little bit worse. With that being said I'm also thinking that after switching from the [facility] Dressing to the silver  collagen would really has not looked that's good in my opinion. We may want to swit 02/11/18 on evaluation today patient's right lower/lateral lower extremity ulcers appear to be doing very well at this point. Especially the more proximal ulcer has filled in much closer to surface which is good news. Nonetheless both show signs of improvement which is great news. There does not appear to be any evidence of infection which is also good news. In general patient has  been doing well tolerating the wraps as well as the [employer]. Patient History Information obtained from Patient. Family History Kidney Disease - Mother, No family history of Cancer, Diabetes, Heart Disease, Hereditary Spherocytosis, Hypertension, Lung Disease, Seizures, Stroke, Thyroid Problems, Tuberculosis. Social History Never smoker, Marital Status - Married, Alcohol Use - Never, Drug Use - No History, Caffeine Use - Daily. Review of Systems (ROS) Constitutional Symptoms (General Health) Denies complaints or symptoms of Fever, Chills. Respiratory The patient has no complaints or symptoms. Cardiovascular Complains or has symptoms of LE edema. Psychiatric The patient has no complaints or symptoms. Objective Constitutional Obese and well-hydrated in no acute distress. Vitals Time Taken: 9:32 AM, Height: 74 in, Weight: 505 lbs, BMI: 64.8, Temperature: 98.1 F, Pulse: 97 bpm, Respiratory Rate: 18 breaths/min, Blood Pressure: 141/79 mmHg. Respiratory normal breathing without difficulty. Cardiovascular 2+ pitting edema of the bilateral lower extremities. Psychiatric this patient is able to make decisions and demonstrates good insight into disease process. Alert and Oriented x 3. pleasant and cooperative. Tuana, Hoheisel Valene R. ([id number]) General Notes: Patient's wound did show granulation with some Slough and biofilm covering the surface. This was removed today with good result and post debridement the wound bed  appear to be doing much better. At this time I'm gonna suggest that we continue with the current wound care measures and she seems to be doing so well. Integumentary (Hair, Skin) Wound #2 status is Open. Original cause of wound was Gradually Appeared. The wound is located on the Right,Distal,Lateral Lower Leg. The wound measures 2.3cm length x 4cm width x 0.2cm depth; 7.226cm^2 area and 1.445cm^3 volume. There is Fat Layer (Subcutaneous Tissue) Exposed exposed. There is no tunneling or undermining noted. There is a large amount of serosanguineous drainage noted. The wound margin is flat and intact. There is large (67-100%) red, hyper - granulation within the wound bed. There is a small (1-33%) amount of necrotic tissue within the wound bed including Adherent Slough. The periwound skin appearance had no abnormalities noted for color. The periwound skin appearance exhibited: Scarring. The periwound skin appearance did not exhibit: Callus, Crepitus, Excoriation, Induration, Rash, Dry/Scaly, Maceration. Periwound temperature was noted as No Abnormality. The periwound has tenderness on palpation. Assessment Active Problems ICD-10 L97.212 - 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d I89.0 - Lymphedema, not elsewhere classified I87.311 - Chronic venous hypertension (idiopathic) with ulcer of right lower extremity E66.01 - Morbid (severe) obesity due to excess calories Procedures Wound #2 Pre-procedure diagnosis of Wound #2 is a Lymphedema located on the Right,Distal,Lateral Lower Leg . There was a Excisional Skin/Subcutaneous Tissue Debridement with a total area of 9.2 sq cm performed by STONE III, Kemal Amores E., PA-C. With the following instrument(s): Curette. to remove Viable and Non-Viable tissue/material Material removed includes Subcutaneous Tissue, and Slough, Biofilm, and Slough after achieving pain control using Other (lidocaine 4%). No specimens were taken. A time out was  conducted at 09:48, prior to the start of the procedure. A Minimum amount of bleeding was controlled with Pressure. The procedure was tolerated well with a pain level of 0 throughout and a pain level of 0 following the procedure. Post Debridement Measurements: 2.3cm length x 4cm width x 0.2cm depth; 1.445cm^3 volume. Character of Wound/Ulcer Post Debridement is stable. Post procedure Diagnosis Wound #2: Same as Pre-Procedure Plan Wound Cleansing: Wound #2 Right,Distal,Lateral Lower Leg: Clean wound with Normal Saline. Cleanse wound with mild soap and water Anesthetic (add to Medication List): [name], [name] ([id number]) Wound #2  Right,Distal,Lateral Lower Leg: Topical Lidocaine 4% cream applied to wound bed prior to debridement (In Clinic Only). Skin Barriers/Peri-Wound Care: Wound #2 Right,Distal,Lateral Lower Leg: Barrier cream Moisturizing lotion Secondary Dressing: Wound #2 Right,Distal,Lateral Lower Leg: ABD pad XtraSorb Dressing Change Frequency: Wound #2 Right,Distal,Lateral Lower Leg: Change dressing every week Follow-up Appointments: Return Appointment in 1 week. Nurse Visit as needed Edema Control: Wound #2 Right,Distal,Lateral Lower Leg: 4-Layer Compression System - Right Lower Extremity - unna to anchor Elevate legs to the level of the heart and pump ankles as often as possible Additional Orders / Instructions: Wound #2 Right,Distal,Lateral Lower Leg: Vitamin A; Vitamin C, Zinc Increase protein intake. Other: - Please watch your salt (sodium) intake Patient is in agreement with continuing with the current measures again she is also pleased that things seem to be doing well and showing signs of improvement. We will see were things stand in one [name] time. Please see above for specific wound care orders. We will see patient for re-evaluation in 1 week(s) here in the clinic. If anything worsens or changes patient will contact our office for additional  recommendations. Electronic Signature(s) Signed: 02/12/2018 12:16:15 AM By: Lenda Kelp PA-C Entered By: Lenda Kelp on 02/11/2018 23:08:19 [name] ([id number]) -------------------------------------------------------------------------------- ROS/PFSH Details Patient Name: [name] Date of Service: 02/11/2018 9:15 AM Medical Record Number: [id number] Patient Account Number: [id number] Date of Birth/Sex: 08/20/1981 (37 y.o. F) Treating RN: Renne Crigler Primary Care Provider: PATIENT, NO Other Clinician: Referring Provider: Referral, Self Treating Provider/Extender: STONE III, Charlsie Fleeger [name] in Treatment: 22 Information Obtained From Patient Wound History Do you currently have one or more open woundso Yes How many open wounds do you currently haveo 1 Approximately how long have you had your woundso 1 month How have you been treating your wound(s) until nowo aquacel ag Has your wound(s) ever healed and then re-openedo No Have you had any lab work done in the past montho No Have you tested positive for an antibiotic resistant organism (MRSA, VRE)o No Have you tested positive for osteomyelitis (bone infection)o No Have you had any tests for circulation on your legso Yes Who ordered the testo [facility] Where was the test doneo [facility] Constitutional Symptoms (General Health) Complaints and Symptoms: Negative for: Fever; Chills Cardiovascular Complaints and Symptoms: Positive for: LE edema Medical History: Positive for: Hypertension Negative for: Angina; Arrhythmia; Congestive Heart Failure; Coronary Artery Disease; Deep Vein Thrombosis; Hypotension; Myocardial Infarction; Peripheral Arterial Disease; Peripheral Venous Disease; Phlebitis; Vasculitis Hematologic/Lymphatic Medical History: Positive for: Lymphedema Respiratory Complaints and Symptoms: No Complaints or Symptoms Medical History: Negative for: Aspiration; Asthma; Chronic Obstructive Pulmonary Disease  (COPD); Pneumothorax; Sleep Apnea; Tuberculosis Psychiatric Complaints and Symptoms: No Complaints or Symptoms [name], [name] ([id number]) Immunizations Pneumococcal Vaccine: Received Pneumococcal Vaccination: No Immunization Notes: up to date Implantable Devices Family and Social History Cancer: No; Diabetes: No; Heart Disease: No; Hereditary Spherocytosis: No; Hypertension: No; Kidney Disease: Yes - Mother; Lung Disease: No; Seizures: No; Stroke: No; Thyroid Problems: No; Tuberculosis: No; Never smoker; Marital Status - Married; Alcohol Use: Never; Drug Use: No History; Caffeine Use: Daily; Financial Concerns: No; Food, Clothing or Shelter Needs: No; Support System Lacking: No; Transportation Concerns: No; Advanced Directives: No; Patient does not want information on Advanced Directives Physician Affirmation I have reviewed and agree with the above information. Electronic Signature(s) Signed: 02/12/2018 12:16:15 AM By: Lenda Kelp PA-C Signed: 02/13/2018 7:46:43 AM By: Renne Crigler Entered By: Lenda Kelp on 02/11/2018 23:07:33 [name], [name] ([id number]) --------------------------------------------------------------------------------  SuperBill Details Patient Name: [name], [name] Date of Service: 02/11/2018 Medical Record Number: [id number] Patient Account Number: [id number] Date of Birth/Sex: 12-Jul-1981 (37 y.o. F) Treating RN: Renne Crigler Primary Care Provider: PATIENT, NO Other Clinician: Referring Provider: Referral, Self Treating Provider/Extender: STONE III, Asher Babilonia [name] in Treatment: 22 Diagnosis Coding ICD-10 Codes Code Description [phone] 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d I89.0 Lymphedema, not elsewhere classified I87.311 Chronic venous hypertension (idiopathic) with ulcer of right lower extremity E66.01 Morbid (severe) obesity due to excess calories Facility Procedures CPT4 Code: [id number] Description: 11042 - DEB SUBQ  TISSUE 20 SQ CM/< ICD-10 Diagnosis Description L97.212 Non-pressure chronic ulcer of right calf with fat layer expo Modifier: sed Quantity: 1 Physician Procedures CPT4 Code: [id number] Description: 11042 - WC PHYS SUBQ TISS 20 SQ CM ICD-10 Diagnosis Description L97.212 Non-pressure chronic ulcer of right calf with fat layer expo Modifier: sed Quantity: 1 Electronic Signature(s) Signed: 02/12/2018 12:16:15 AM By: Lenda Kelp PA-C Entered By: Lenda Kelp on 02/11/2018 23:08:45

## 2018-02-14 NOTE — Progress Notes (Signed)
[name], Arely R. ([id number]) Visit Report for 02/11/2018 Arrival Information Details Patient Name: [name], [name] Date of Service: 02/11/2018 9:15 AM Medical Record Number: [id number] Patient Account Number: [id number] Date of Birth/Sex: 1981/10/04 (37 y.o. F) Treating RN: Phillis HaggisPinkerton, Debi Primary Care Galia Rahm: PATIENT, NO Other Clinician: Referring Lillyanna Glandon: Referral, Self Treating Durwin Davisson/Extender: STONE III, HOYT Weeks in Treatment: 22 Visit Information History Since Last Visit All ordered tests and consults were completed: No Patient Arrived: Ambulatory Added or deleted any medications: No Arrival Time: 09:30 Any new allergies or adverse reactions: No Accompanied By: self Had a fall or experienced change in No Transfer Assistance: None activities of daily living that may affect Patient Identification Verified: Yes risk of falls: Secondary Verification Process Completed: Yes Signs or symptoms of abuse/neglect since last visito No Patient Requires Transmission-Based No Hospitalized since last visit: No Precautions: Implantable device outside of the clinic excluding No Patient Has Alerts: No cellular tissue based products placed in the center since last visit: Has Dressing in Place as Prescribed: Yes Has Compression in Place as Prescribed: Yes Pain Present Now: No Electronic Signature(s) Signed: 02/11/2018 4:28:49 PM By: Alejandro MullingPinkerton, Debra Entered By: Alejandro MullingPinkerton, Debra on 02/11/2018 09:30:36 Breunig, Kassidee R. ([id number]) -------------------------------------------------------------------------------- Encounter Discharge Information Details Patient Name: [name], [name] Date of Service: 02/11/2018 9:15 AM Medical Record Number: [id number] Patient Account Number: [id number] Date of Birth/Sex: 1981/10/04 (37 y.o. F) Treating RN: Curtis Sitesorthy, Joanna Primary Care Laksh Hinners: PATIENT, NO Other Clinician: Referring Jaxten Brosh: Referral, Self Treating Eliceo Gladu/Extender: STONE III, HOYT Weeks in  Treatment: 22 Encounter Discharge Information Items Discharge Pain Level: 0 Discharge Condition: Stable Ambulatory Status: Ambulatory Discharge Destination: Home Transportation: Private Auto Accompanied By: self Schedule Follow-up Appointment: Yes Medication Reconciliation completed and No provided to Patient/Care Zabrina Brotherton: Patient Clinical Summary of Care: Declined Electronic Signature(s) Signed: 02/13/2018 10:05:17 AM By: Gwenlyn PerkingMoore, Shelia Entered By: Gwenlyn PerkingMoore, Shelia on 02/11/2018 10:11:59 Vignola, [name] ([id number]) -------------------------------------------------------------------------------- Lower Extremity Assessment Details Patient Name: [name], [name] Date of Service: 02/11/2018 9:15 AM Medical Record Number: [id number] Patient Account Number: [id number] Date of Birth/Sex: 1981/10/04 (37 y.o. F) Treating RN: Phillis HaggisPinkerton, Debi Primary Care Alizon Schmeling: PATIENT, NO Other Clinician: Referring Jeronica Stlouis: Referral, Self Treating Brittan Mapel/Extender: STONE III, HOYT Weeks in Treatment: 22 Edema Assessment Assessed: [Left: No] [Right: No] [Left: Edema] [Right: :] Calf Left: Right: Point of Measurement: 35 cm From Medial Instep cm 58.5 cm Ankle Left: Right: Point of Measurement: 9 cm From Medial Instep cm 36.5 cm Vascular Assessment Pulses: Dorsalis Pedis Palpable: [Right:Yes] Posterior Tibial Extremity colors, hair growth, and conditions: Extremity Color: [Right:Hyperpigmented] Temperature of Extremity: [Right:Warm] Capillary Refill: [Right:< 3 seconds] Toe Nail Assessment Left: Right: Thick: Yes Discolored: Yes Deformed: Yes Improper Length and Hygiene: Yes Electronic Signature(s) Signed: 02/11/2018 4:28:49 PM By: Alejandro MullingPinkerton, Debra Entered By: Alejandro MullingPinkerton, Debra on 02/11/2018 09:43:08 Elamin, Ariannah R. ([id number]) -------------------------------------------------------------------------------- Multi Wound Chart Details Patient Name: [name], [name] Date of Service: 02/11/2018  9:15 AM Medical Record Number: [id number] Patient Account Number: [id number] Date of Birth/Sex: 1981/10/04 (37 y.o. F) Treating RN: Renne CriglerFlinchum, Cheryl Primary Care Marcell Chavarin: PATIENT, NO Other Clinician: Referring Marquel Pottenger: Referral, Self Treating Bralynn Donado/Extender: STONE III, HOYT Weeks in Treatment: 22 Vital Signs Height(in): 74 Pulse(bpm): 97 Weight(lbs): 505 Blood Pressure(mmHg): 141/79 Body Mass Index(BMI): 65 Temperature(F): 98.1 Respiratory Rate 18 (breaths/min): Photos: [2:No Photos] [N/A:N/A] Wound Location: [2:Right Lower Leg - Lateral, Distal] [N/A:N/A] Wounding Event: [2:Gradually Appeared] [N/A:N/A] Primary Etiology: [2:Lymphedema] [N/A:N/A] Comorbid History: [2:Lymphedema, Hypertension] [N/A:N/A] Date Acquired: [2:08/08/2017] [N/A:N/A] Weeks of Treatment: [2:22] [N/A:N/A] Wound Status: [2:Open] [N/A:N/A] Clustered  Wound: [2:Yes] [N/A:N/A] Clustered Quantity: [2:2] [N/A:N/A] Measurements L x W x D [2:2.3x4x0.2] [N/A:N/A] (cm) Area (cm) : [2:7.226] [N/A:N/A] Volume (cm) : [2:1.445] [N/A:N/A] % Reduction in Area: [2:66.30%] [N/A:N/A] % Reduction in Volume: [2:77.50%] [N/A:N/A] Classification: [2:Full Thickness With Exposed Support Structures] [N/A:N/A] Exudate Amount: [2:Large] [N/A:N/A] Exudate Type: [2:Serosanguineous] [N/A:N/A] Exudate Color: [2:red, brown] [N/A:N/A] Wound Margin: [2:Flat and Intact] [N/A:N/A] Granulation Amount: [2:Large (67-100%)] [N/A:N/A] Granulation Quality: [2:Red, Hyper-granulation] [N/A:N/A] Necrotic Amount: [2:Small (1-33%)] [N/A:N/A] Exposed Structures: [2:Fat Layer (Subcutaneous Tissue) Exposed: Yes Fascia: No Tendon: No Muscle: No Joint: No Bone: No] [N/A:N/A] Epithelialization: [2:Small (1-33%)] [N/A:N/A] Periwound Skin Texture: [2:Scarring: Yes Excoriation: No Induration: No] [N/A:N/A] Callus: No Crepitus: No Rash: No Periwound Skin Moisture: Maceration: No N/A N/A Dry/Scaly: No Periwound Skin Color: Atrophie Blanche:  No N/A N/A Cyanosis: No Ecchymosis: No Erythema: No Hemosiderin Staining: No Mottled: No Pallor: No Rubor: No Temperature: No Abnormality N/A N/A Tenderness on Palpation: Yes N/A N/A Wound Preparation: Ulcer Cleansing: N/A N/A Rinsed/Irrigated with Saline, Other: soap and water Topical Anesthetic Applied: Other: lidocaine 4% Treatment Notes Electronic Signature(s) Signed: 02/11/2018 4:23:05 PM By: Renne Crigler Entered By: Renne Crigler on 02/11/2018 09:48:40 Mclaine, [name] ([id number]) -------------------------------------------------------------------------------- Multi-Disciplinary Care Plan Details Patient Name: Jessica Casey R. Date of Service: 02/11/2018 9:15 AM Medical Record Number: [id number] Patient Account Number: [id number] Date of Birth/Sex: 1981/08/14 (37 y.o. F) Treating RN: Renne Crigler Primary Care Krystan Northrop: PATIENT, NO Other Clinician: Referring Miah Boye: Referral, Self Treating Airen Stiehl/Extender: STONE III, HOYT Weeks in Treatment: 22 Active Inactive ` Orientation to the Wound Care Program Nursing Diagnoses: Knowledge deficit related to the wound healing center program Goals: Patient/caregiver will verbalize understanding of the Wound Healing Center Program Date Initiated: 09/10/2017 Target Resolution Date: 11/23/2017 Goal Status: Active Interventions: Provide education on orientation to the wound center Notes: ` Venous Leg Ulcer Nursing Diagnoses: Knowledge deficit related to disease process and management Goals: Patient/caregiver will verbalize understanding of disease process and disease management Date Initiated: 09/10/2017 Target Resolution Date: 11/23/2017 Goal Status: Active Interventions: Assess peripheral edema status every visit. Notes: ` Wound/Skin Impairment Nursing Diagnoses: Impaired tissue integrity Goals: Ulcer/skin breakdown will heal within 14 weeks Date Initiated: 09/10/2017 Target Resolution Date: 11/24/2017 Goal  Status: Active Interventions: [name], [name] ([id number]) Assess patient/caregiver ability to obtain necessary supplies Assess patient/caregiver ability to perform ulcer/skin care regimen upon admission and as needed Assess ulceration(s) every visit Notes: Electronic Signature(s) Signed: 02/11/2018 4:23:05 PM By: Renne Crigler Entered By: Renne Crigler on 02/11/2018 09:48:31 Seelman, [name] ([id number]) -------------------------------------------------------------------------------- Pain Assessment Details Patient Name: Jessica Casey R. Date of Service: 02/11/2018 9:15 AM Medical Record Number: [id number] Patient Account Number: [id number] Date of Birth/Sex: [birth year] (37 y.o. F) Treating RN: Phillis Haggis Primary Care Shraga Custard: PATIENT, NO Other Clinician: Referring Tanayah Squitieri: Referral, Self Treating Jahmil Macleod/Extender: STONE III, HOYT Weeks in Treatment: 22 Active Problems Location of Pain Severity and Description of Pain Patient Has Paino No Site Locations Pain Management and Medication Current Pain Management: Electronic Signature(s) Signed: 02/11/2018 4:28:49 PM By: [name] Entered By: [name] on 02/11/2018 09:30:41 Becherer, [name] ([id number]) -------------------------------------------------------------------------------- Patient/Caregiver Education Details Patient Name: Jessica Casey R. Date of Service: 02/11/2018 9:15 AM Medical Record Number: [id number] Patient Account Number: [id number] Date of Birth/Gender: Jul 11, 1981 (37 y.o. F) Treating RN: Curtis Sites Primary Care Physician: PATIENT, NO Other Clinician: Referring Physician: Referral, Self Treating Physician/Extender: Linwood Dibbles, HOYT Weeks in Treatment: 22 Education Assessment Education Provided To: Patient Education Topics Provided Venous: Handouts: Controlling Swelling with Multilayered Compression Wraps Methods: Explain/Verbal  Responses: State content correctly Electronic  Signature(s) Signed: 02/11/2018 4:46:08 PM By: Curtis Sites Entered By: Curtis Sites on 02/11/2018 10:11:39 [name] ([id number]) -------------------------------------------------------------------------------- Wound Assessment Details Patient Name: Jessica Casey R. Date of Service: 02/11/2018 9:15 AM Medical Record Number: [id number] Patient Account Number: [id number] Date of Birth/Sex: 05/06/1981 (36 y.o. F) Treating RN: Phillis Haggis Primary Care Ahmiyah Coil: PATIENT, NO Other Clinician: Referring Vaibhav Fogleman: Referral, Self Treating Emonnie Cannady/Extender: STONE III, HOYT Weeks in Treatment: 22 Wound Status Wound Number: 2 Primary Etiology: Lymphedema Wound Location: Right Lower Leg - Lateral, Distal Wound Status: Open Wounding Event: Gradually Appeared Comorbid History: Lymphedema, Hypertension Date Acquired: 08/08/2017 Weeks Of Treatment: 22 Clustered Wound: Yes Photos Photo Uploaded By: [name] on 02/11/2018 15:57:40 Wound Measurements Length: (cm) 2.3 Width: (cm) 4 Depth: (cm) 0.2 Clustered Quantity: 2 Area: (cm) 7.226 Volume: (cm) 1.445 % Reduction in Area: 66.3% % Reduction in Volume: 77.5% Epithelialization: Small (1-33%) Tunneling: No Undermining: No Wound Description Full Thickness With Exposed Support Classification: Structures Wound Margin: Flat and Intact Exudate Large Amount: Exudate Type: Serosanguineous Exudate Color: red, brown Foul Odor After Cleansing: No Slough/Fibrino Yes Wound Bed Granulation Amount: Large (67-100%) Exposed Structure Granulation Quality: Red, Hyper-granulation Fascia Exposed: No Necrotic Amount: Small (1-33%) Fat Layer (Subcutaneous Tissue) Exposed: Yes Necrotic Quality: Adherent Slough Tendon Exposed: No Muscle Exposed: No Joint Exposed: No Bone Exposed: No Elmendorf, Dalylah R. ([id number]) Periwound Skin Texture Texture Color No Abnormalities Noted: No No Abnormalities Noted: Yes Callus: No Temperature /  Pain Crepitus: No Temperature: No Abnormality Excoriation: No Tenderness on Palpation: Yes Induration: No Rash: No Scarring: Yes Moisture No Abnormalities Noted: No Dry / Scaly: No Maceration: No Wound Preparation Ulcer Cleansing: Rinsed/Irrigated with Saline, Other: soap and water, Topical Anesthetic Applied: Other: lidocaine 4%, Treatment Notes Wound #2 (Right, Distal, Lateral Lower Leg) 1. Cleansed with: Cleanse wound with antibacterial soap and water 2. Anesthetic Topical Lidocaine 4% cream to wound bed prior to debridement 3. Peri-wound Care: Moisturizing lotion 4. Dressing Applied: Hydrafera Blue 7. Secured with 4-Layer Compression System - Right Lower Extremity Notes xtrasorb, unna to Ecologist) Signed: 02/11/2018 4:28:49 PM By: [name] Entered By: [name] on 02/11/2018 09:42:04 Fowle, Chrisanna R. ([id number]) -------------------------------------------------------------------------------- Vitals Details Patient Name: Jessica Casey R. Date of Service: 02/11/2018 9:15 AM Medical Record Number: [id number] Patient Account Number: [id number] Date of Birth/Sex: [birth year] (37 y.o. F) Treating RN: Phillis Haggis Primary Care Kesler Wickham: PATIENT, NO Other Clinician: Referring Arvis Zwahlen: Referral, Self Treating Nicki Furlan/Extender: STONE III, HOYT Weeks in Treatment: 22 Vital Signs Time Taken: 09:32 Temperature (F): 98.1 Height (in): 74 Pulse (bpm): 97 Weight (lbs): 505 Respiratory Rate (breaths/min): 18 Body Mass Index (BMI): 64.8 Blood Pressure (mmHg): 141/79 Reference Range: 80 - 120 mg / dl Electronic Signature(s) Signed: 02/11/2018 4:28:49 PM By: [name] Entered By: [name] on 02/11/2018 09:32:22

## 2018-02-18 ENCOUNTER — Other Ambulatory Visit
Admission: RE | Admit: 2018-02-18 | Discharge: 2018-02-18 | Disposition: A | Discharge status: Home / Self Care | Payer: Self-pay | Source: Ambulatory Visit | Attending: Physician Assistant | Admitting: Physician Assistant

## 2018-02-18 ENCOUNTER — Encounter: Payer: Self-pay | Attending: Physician Assistant | Admitting: Physician Assistant

## 2018-02-18 DIAGNOSIS — B999 Unspecified infectious disease: Secondary | ICD-10-CM | POA: Insufficient documentation

## 2018-02-18 DIAGNOSIS — L97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d: Secondary | ICD-10-CM | POA: Insufficient documentation

## 2018-02-18 DIAGNOSIS — I872 Venous insufficiency (chronic) (peripheral): Secondary | ICD-10-CM | POA: Insufficient documentation

## 2018-02-18 DIAGNOSIS — I890 Lymphedema, not elsewhere classified: Secondary | ICD-10-CM | POA: Insufficient documentation

## 2018-02-18 DIAGNOSIS — Z6841 Body Mass Index (BMI) 40.0 and over, adult: Secondary | ICD-10-CM | POA: Insufficient documentation

## 2018-02-19 NOTE — Progress Notes (Addendum)
[name] ([id number]) Visit Report for 02/18/2018 Chief Complaint Document Details Patient Name: Jessica, [name]. Date of Service: 02/18/2018 9:15 AM Medical Record Number: [id number] Patient Account Number: [id number] Date of Birth/Sex: February 26, 1981 (37 y.o. F) Treating RN: Renne Crigler Primary Care Provider: PATIENT, NO Other Clinician: Referring Provider: Referral, Self Treating Provider/Extender: [name] Weeks in Treatment: 23 Information Obtained from: Patient Chief Complaint She is here in follow up evaluation of right lower extremity ulcers Electronic Signature(s) Signed: 02/19/2018 8:40:52 AM By: Lenda Kelp PA-C Entered By: Lenda Kelp on 02/18/2018 09:16:26 [name], [name] ([id number]) -------------------------------------------------------------------------------- Debridement Details Patient Name: Jessica Casey R. Date of Service: 02/18/2018 9:15 AM Medical Record Number: [id number] Patient Account Number: [id number] Date of Birth/Sex: 02/12/1981 (37 y.o. F) Treating RN: Renne Crigler Primary Care Provider: PATIENT, NO Other Clinician: Referring Provider: Referral, Self Treating Provider/Extender: Jessica III, Karene Bracken Weeks in Treatment: 23 Debridement Performed for Wound #2 Right,Distal,Lateral Lower Leg Assessment: Performed By: Physician Jessica III, Yarielis Funaro E., PA-C Debridement Type: Debridement Pre-procedure Verification/Time Yes - 10:04 Out Taken: Start Time: 10:04 Pain Control: Other : lidocaine 4% Total Area Debrided (L x W): 2.5 (cm) x 4.5 (cm) = 11.25 (cm) Tissue and other material Viable, Non-Viable, Slough, Subcutaneous, Biofilm, Slough debrided: Level: Skin/Subcutaneous Tissue Debridement Description: Excisional Instrument: Curette Bleeding: Minimum Hemostasis Achieved: Pressure End Time: 10:06 Procedural Pain: 0 Post Procedural Pain: 0 Response to Treatment: Procedure was tolerated well Post Debridement Measurements of Total  Wound Length: (cm) 2.5 Width: (cm) 4.5 Depth: (cm) 0.3 Volume: (cm) 2.651 Character of Wound/Ulcer Post Debridement: Stable Post Procedure Diagnosis Same as Pre-procedure Electronic Signature(s) Signed: 02/18/2018 5:00:26 PM By: Renne Crigler Signed: 02/19/2018 8:40:52 AM By: Lenda Kelp PA-C Entered By: Renne Crigler on 02/18/2018 10:07:03 Yellin, [name] ([id number]) -------------------------------------------------------------------------------- HPI Details Patient Name: Jessica Casey R. Date of Service: 02/18/2018 9:15 AM Medical Record Number: [id number] Patient Account Number: [id number] Date of Birth/Sex: May 13, 1981 (37 y.o. F) Treating RN: Renne Crigler Primary Care Provider: PATIENT, NO Other Clinician: Referring Provider: Referral, Self Treating Provider/Extender: Jessica III, Navada Osterhout Weeks in Treatment: 23 History of Present Illness HPI Description: 37 year old patient well known to our [facility] wound care clinic where she has been seen since 2016 for bilateral lower extremity venous insufficiency disease with lymphedema and multiple ulcerations associated with morbid obesity. she had custom-made compression stockings and lymphedema pumps which were used in the past. most recently she was admitted to the hospital between October 11 and 09/02/2017 with sepsis, lower extremity wounds and lymphedema.she was initially treated in the outpatient with Keflex and Bactrim. she was initially treated in the hospital with vancomycin and Zosyn and changed over to Unasyn until her white count improved and her blood cultures were negative for 3 days. After her inpatient management she was discharged home on Augmentin to end on 09/13/2017 with a 14 day course. she has had outpatient vascular duplex scans completed in November 2017 and her right ABI was 1.1 and the left ABI is 1.3. she had normal toe brachial indices bilaterally.she had three-vessel runoff in the right lower extremity  and two-vessel runoff in the left lower extremity. On questioning the patient she does have custom made compression stockings and also has a lymphedema pump but has not been using it appropriately and has not been taking good care of herself. 09/17/2017 -- she returns today with compression stockings on the left side and the right side has had significant amount of drainage and has a very strong odor  09/24/2017 -- the drainage is increased significantly and she has more lymphedema and a very strong odor to her wound. Though she does not have systemic symptoms, or overt infection I believe she will benefit from some doxycycline given empirically. 10/01/2017 -- after starting the doxycycline and changing the dressing twice a week her symptoms and signs have definitely improved overall. 10/08/2017 -- she has completed her course of doxycycline but continues to have a lot of drainage and needs twice a week dressing changes. 11/08/17-she is here in follow-up evaluation for right lower extremity ulcers. She admits to using her lymphedema pumps twice daily, one hour per session. she is voicing no complaints or concerns, no signs of infection [facility]/14/17 on evaluation today patient appears to be doing very well in regard to her wounds. She has been tolerating the dressing changes she continues to develop some portly the adherent granular tissue on the surface of the wound with some Slough. Obviously we are trying to get too much better wound bed. With that being said the hyper granulation the Hydrofera Blue Dressing to have helped with which is excellent news. However I think it may be time to try something a little bit different at this point. 01/11/18 on evaluation today patient appears to be doing fairly well in regard to her right lateral lower extremity ulcers. This shows excellent signs of filling in which is great news. There does not appear to be any evidence of infection which  is also good news. She does continue to work as well is good school. She is having no pain. 01/22/18 on evaluation today patient appears to be doing a little bit worse in my opinion in regard to the overall quality of that granulation on her right lower extremity. She was not here last week due to being sick with a stomach virus this may have something to do with the fact that her wound appears to be a little bit worse. With that being said I'm also thinking that after switching from the [facility] to the silver collagen would really has not looked that's good in my opinion. We may want to swit 02/11/18 on evaluation today patient's right lower/lateral lower extremity ulcers appear to be doing very well at this point. Romack, Jalyssa R. ([id number]) Especially the more proximal ulcer has filled in much closer to surface which is good news. Nonetheless both show signs of improvement which is great news. There does not appear to be any evidence of infection which is also good news. In general patient has been doing well tolerating the wraps as well as the [employer] Blue Dressing. 02/18/18 on evaluation today patient appears to be doing a little bit worse in regard to the periwound region the wounds themselves do not look much deteriorated to me. With that being said she has several small blisters/pustules noted in the periwound and there was a significant amount of drainage and maceration compared to previous. There has been a time that we had to bring her back for twice a week dressing changes as far as her wrap was concerned it has been a while since we've done that however. With that being said the patient has been having some burning and in general I'm concerned about the possibility of infection. She has previously taken doxycycline with good result. Fortunately there does not appear to be any evidence of overall worsening in regard to the size of the wound and in fact the upper wound  actually  appears to be showing signs of good epithelialization. Electronic Signature(s) Signed: 02/19/2018 8:40:52 AM By: Lenda Kelp PA-C Entered By: Lenda Kelp on 02/18/2018 10:08:36 [name] ([id number]) -------------------------------------------------------------------------------- Physical Exam Details Patient Name: Jessica Casey R. Date of Service: 02/18/2018 9:15 AM Medical Record Number: [id number] Patient Account Number: [id number] Date of Birth/Sex: [birth year] (37 y.o. F) Treating RN: Renne Crigler Primary Care Provider: PATIENT, NO Other Clinician: Referring Provider: Referral, Self Treating Provider/Extender: Jessica III, Kolbie Clarkston Weeks in Treatment: 23 Constitutional Well-nourished and well-hydrated in no acute distress. Respiratory normal breathing without difficulty. clear to auscultation bilaterally. Cardiovascular regular rate and rhythm with normal S1, S2. Psychiatric this patient is able to make decisions and demonstrates good insight into disease process. Alert and Oriented x 3. pleasant and cooperative. Notes Patient's wound bed did have some Slough noted on the surface of the wound and there was otherwise a little bit of hyper granular tissue the Hydrofera Blue Dressing to managing this fairly well. In general her swelling was not as significant as previously noted which is good news. There does not appear to be any evidence of severe infection although there was some erythema as well a small blisters and pustules noted in the surrounding periwound. These have mostly ruptured and retraining a culture was obtained from the sites. Post debridement the wound beds did both appear to be doing much better especially the upper. Electronic Signature(s) Signed: 02/19/2018 8:40:52 AM By: Lenda Kelp PA-C Entered By: Lenda Kelp on 02/18/2018 10:09:31 [name]  ([id number]) -------------------------------------------------------------------------------- Physician Orders Details Patient Name: Jessica Casey R. Date of Service: 02/18/2018 9:15 AM Medical Record Number: [id number] Patient Account Number: [id number] Date of Birth/Sex: 19-Jan-1981 (37 y.o. F) Treating RN: Renne Crigler Primary Care Provider: PATIENT, NO Other Clinician: Referring Provider: Referral, Self Treating Provider/Extender: Jessica III, Aadyn Buchheit Weeks in Treatment: 79 Verbal / Phone Orders: No Diagnosis Coding ICD-10 Coding Code Description [phone] 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d I89.0 Lymphedema, not elsewhere classified I87.311 Chronic venous hypertension (idiopathic) with ulcer of right lower extremity E66.01 Morbid (severe) obesity due to excess calories Wound Cleansing Wound #2 Right,Distal,Lateral Lower Leg o Clean wound with Normal Saline. o Cleanse wound with mild soap and water Anesthetic (add to Medication List) Wound #2 Right,Distal,Lateral Lower Leg o Topical Lidocaine 4% cream applied to wound bed prior to debridement (In Clinic Only). Skin Barriers/Peri-Wound Care Wound #2 Right,Distal,Lateral Lower Leg o Barrier cream o Moisturizing lotion Primary Wound Dressing Wound #2 Right,Distal,Lateral Lower Leg o Hydrafera Blue Ready Transfer Secondary Dressing Wound #2 Right,Distal,Lateral Lower Leg o ABD pad o XtraSorb Dressing Change Frequency Wound #2 Right,Distal,Lateral Lower Leg o Dressing is to be changed Monday and Thursday. Follow-up Appointments o Other: - Monday and Thursday appointment Edema Control Wound #2 Right,Distal,Lateral Lower Leg o 4-Layer Compression System - Right Lower Extremity - unna to anchor o Elevate legs to the level of the heart and pump ankles as often as possible Christenson, Dayan R. ([id number]) Additional Orders / Instructions Wound #2 Right,Distal,Lateral Lower Leg o  Vitamin A; Vitamin C, Zinc o Increase protein intake. o Other: - Please watch your salt (sodium) intake Laboratory o Bacteria identified in Wound by Culture (MICRO) - right lateral lower leg oooo LOINC Code: 6462-6 oooo Convenience Name: Wound culture routine Patient Medications Allergies: No Known Allergies Notifications Medication Indication Start End doxycycline hyclate 02/18/2018 DOSE 1 - oral 100 mg capsule - 1 capsule oral taken 2 times a day for 10  days Cipro 02/22/2018 DOSE 1 - oral 500 mg tablet - 1 tablet oral taken 2 times a day for 10 days. Patient is to stop taking the Doxycycline Electronic Signature(s) Signed: 02/22/2018 6:05:50 PM By: Lenda Kelp PA-C Previous Signature: 02/19/2018 3:09:28 PM Version By: Renne Crigler Previous Signature: 02/19/2018 5:42:34 PM Version By: Lenda Kelp PA-C Previous Signature: 02/18/2018 10:10:47 AM Version By: Lenda Kelp PA-C Entered By: Lenda Kelp on 02/22/2018 18:05:49 Gongaware, Janaria R. ([id number]) -------------------------------------------------------------------------------- Problem List Details Patient Name: Jessica Casey R. Date of Service: 02/18/2018 9:15 AM Medical Record Number: [id number] Patient Account Number: [id number] Date of Birth/Sex: 06-07-1981 (37 y.o. F) Treating RN: Renne Crigler Primary Care Provider: PATIENT, NO Other Clinician: Referring Provider: Referral, Self Treating Provider/Extender: Linwood Dibbles, Alilah Mcmeans Weeks in Treatment: 23 Active Problems ICD-10 Impacting Encounter Code Description Active Date Wound Healing Diagnosis L97.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d 09/10/2017 Yes I89.0 Lymphedema, not elsewhere classified 09/10/2017 Yes I87.311 Chronic venous hypertension (idiopathic) with ulcer of right 09/10/2017 Yes lower extremity E66.01 Morbid (severe) obesity due to excess calories 09/10/2017 Yes Inactive Problems Resolved Problems Electronic  Signature(s) Signed: 02/19/2018 8:40:52 AM By: Lenda Kelp PA-C Entered By: Lenda Kelp on 02/18/2018 09:16:18 Sova, Aislin R. ([id number]) -------------------------------------------------------------------------------- Progress Note Details Patient Name: Jessica Casey R. Date of Service: 02/18/2018 9:15 AM Medical Record Number: [id number] Patient Account Number: [id number] Date of Birth/Sex: February 11, 1981 (37 y.o. F) Treating RN: Renne Crigler Primary Care Provider: PATIENT, NO Other Clinician: Referring Provider: Referral, Self Treating Provider/Extender: Jessica III, Regina Ganci Weeks in Treatment: 23 Subjective Chief Complaint Information obtained from Patient She is here in follow up evaluation of right lower extremity ulcers History of Present Illness (HPI) 37 year old patient well known to our [facility] wound care clinic where she has been seen since 2016 for bilateral lower extremity venous insufficiency disease with lymphedema and multiple ulcerations associated with morbid obesity. she had custom-made compression stockings and lymphedema pumps which were used in the past. most recently she was admitted to the hospital between October 11 and 09/02/2017 with sepsis, lower extremity wounds and lymphedema.she was initially treated in the outpatient with Keflex and Bactrim. she was initially treated in the hospital with vancomycin and Zosyn and changed over to Unasyn until her white count improved and her blood cultures were negative for 3 days. After her inpatient management she was discharged home on Augmentin to end on 09/13/2017 with a 14 day course. she has had outpatient vascular duplex scans completed in November 2017 and her right ABI was 1.1 and the left ABI is 1.3. she had normal toe brachial indices bilaterally.she had three-vessel runoff in the right lower extremity and two-vessel runoff in the left lower extremity. On questioning the patient she does have custom made  compression stockings and also has a lymphedema pump but has not been using it appropriately and has not been taking good care of herself. 09/17/2017 -- she returns today with compression stockings on the left side and the right side has had significant amount of drainage and has a very strong odor 09/24/2017 -- the drainage is increased significantly and she has more lymphedema and a very strong odor to her wound. Though she does not have systemic symptoms, or overt infection I believe she will benefit from some doxycycline given empirically. 10/01/2017 -- after starting the doxycycline and changing the dressing twice a week her symptoms and signs have definitely improved overall. 10/08/2017 -- she has completed her course  of doxycycline but continues to have a lot of drainage and needs twice a week dressing changes. 11/08/17-she is here in follow-up evaluation for right lower extremity ulcers. She admits to using her lymphedema pumps twice daily, one hour per session. she is voicing no complaints or concerns, no signs of infection will change to [facility] 12/14/17 on evaluation today patient appears to be doing very well in regard to her wounds. She has been tolerating the dressing changes she continues to develop some portly the adherent granular tissue on the surface of the wound with some Slough. Obviously we are trying to get too much better wound bed. With that being said the hyper granulation the Hydrofera Blue Dressing to have helped with which is excellent news. However I think it may be time to try something a little bit different at this point. 01/11/18 on evaluation today patient appears to be doing fairly well in regard to her right lateral lower extremity ulcers. This shows excellent signs of filling in which is great news. There does not appear to be any evidence of infection which is also good news. She does continue to work as well is good school. She is having no pain. 01/22/18 on  evaluation today patient appears to be doing a little bit worse in my opinion in regard to the overall quality of that Ahlquist, Ayline R. ([id number]) granulation on her right lower extremity. She was not here last week due to being sick with a stomach virus this may have something to do with the fact that her wound appears to be a little bit worse. With that being said I'm also thinking that after switching from the [facility] Dressing to the silver collagen would really has not looked that's good in my opinion. We may want to swit 02/11/18 on evaluation today patient's right lower/lateral lower extremity ulcers appear to be doing very well at this point. Especially the more proximal ulcer has filled in much closer to surface which is good news. Nonetheless both show signs of improvement which is great news. There does not appear to be any evidence of infection which is also good news. In general patient has been doing well tolerating the wraps as well as the [employer]. 02/18/18 on evaluation today patient appears to be doing a little bit worse in regard to the periwound region the wounds themselves do not look much deteriorated to me. With that being said she has several small blisters/pustules noted in the periwound and there was a significant amount of drainage and maceration compared to previous. There has been a time that we had to bring her back for twice a week dressing changes as far as her wrap was concerned it has been a while since we've done that however. With that being said the patient has been having some burning and in general I'm concerned about the possibility of infection. She has previously taken doxycycline with good result. Fortunately there does not appear to be any evidence of overall worsening in regard to the size of the wound and in fact the upper wound actually appears to be showing signs of good epithelialization. Patient History Information obtained from  Patient. Family History Kidney Disease - Mother, No family history of Cancer, Diabetes, Heart Disease, Hereditary Spherocytosis, Hypertension, Lung Disease, Seizures, Stroke, Thyroid Problems, Tuberculosis. Social History Never smoker, Marital Status - Married, Alcohol Use - Never, Drug Use - No History, Caffeine Use - Daily. Review of Systems (ROS) Constitutional Symptoms (General Health) Denies  complaints or symptoms of Fever, Chills. Respiratory The patient has no complaints or symptoms. Cardiovascular Complains or has symptoms of LE edema. Psychiatric The patient has no complaints or symptoms. Objective Constitutional Well-nourished and well-hydrated in no acute distress. Vitals Time Taken: 9:42 AM, Height: 74 in, Weight: 505 lbs, BMI: 64.8, Temperature: 98.1 F, Pulse: 85 bpm, Respiratory Rate: 18 breaths/min, Blood Pressure: 157/78 mmHg. Respiratory normal breathing without difficulty. clear to auscultation bilaterally. Jeschke, [name] ([id number]) Cardiovascular regular rate and rhythm with normal S1, S2. Psychiatric this patient is able to make decisions and demonstrates good insight into disease process. Alert and Oriented x 3. pleasant and cooperative. General Notes: Patient's wound bed did have some Slough noted on the surface of the wound and there was otherwise a little bit of hyper granular tissue the Hydrofera Blue Dressing to managing this fairly well. In general her swelling was not as significant as previously noted which is good news. There does not appear to be any evidence of severe infection although there was some erythema as well a small blisters and pustules noted in the surrounding periwound. These have mostly ruptured and retraining a culture was obtained from the sites. Post debridement the wound beds did both appear to be doing much better especially the upper. Integumentary (Hair, Skin) Wound #2 status is Open. Original cause of wound was Gradually  Appeared. The wound is located on the Right,Distal,Lateral Lower Leg. The wound measures 2.5cm length x 4.5cm width x 0.2cm depth; 8.836cm^2 area and 1.767cm^3 volume. There is Fat Layer (Subcutaneous Tissue) Exposed exposed. There is no tunneling or undermining noted. There is a large amount of serosanguineous drainage noted. The wound margin is flat and intact. There is large (67-100%) red, hyper - granulation within the wound bed. There is a small (1-33%) amount of necrotic tissue within the wound bed including Adherent Slough. The periwound skin appearance had no abnormalities noted for color. The periwound skin appearance exhibited: Excoriation, Scarring, Maceration. The periwound skin appearance did not exhibit: Callus, Crepitus, Induration, Rash, Dry/Scaly. Periwound temperature was noted as No Abnormality. The periwound has tenderness on palpation. Assessment Active Problems ICD-10 L97.212 - 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d I89.0 - Lymphedema, not elsewhere classified I87.311 - Chronic venous hypertension (idiopathic) with ulcer of right lower extremity E66.01 - Morbid (severe) obesity due to excess calories Procedures Wound #2 Pre-procedure diagnosis of Wound #2 is a Lymphedema located on the Right,Distal,Lateral Lower Leg . There was a Excisional Skin/Subcutaneous Tissue Debridement with a total area of 11.25 sq cm performed by Jessica III, Delphine Sizemore E., PA-C. With the following instrument(s): Curette. to remove Viable and Non-Viable tissue/material Material removed includes Subcutaneous Tissue, and Slough, Biofilm, and Slough after achieving pain control using Other (lidocaine 4%). No specimens were taken. A time out was conducted at 10:04, prior to the start of the procedure. A Minimum amount of bleeding was controlled with Pressure. The procedure was tolerated well with a pain level of 0 throughout and a pain level of 0 following the procedure. Post  Debridement Measurements: 2.5cm length x 4.5cm width x 0.3cm depth; 2.651cm^3 volume. Character of Wound/Ulcer Post Debridement is stable. Post procedure Diagnosis Wound #2: Same as Pre-Procedure Hollerbach, [name] ([id number]) Plan Wound Cleansing: Wound #2 Right,Distal,Lateral Lower Leg: Clean wound with Normal Saline. Cleanse wound with mild soap and water Anesthetic (add to Medication List): Wound #2 Right,Distal,Lateral Lower Leg: Topical Lidocaine 4% cream applied to wound bed prior to debridement (In Clinic Only). Skin  Barriers/Peri-Wound Care: Wound #2 Right,Distal,Lateral Lower Leg: Barrier cream Moisturizing lotion Primary Wound Dressing: Wound #2 Right,Distal,Lateral Lower Leg: Hydrafera Blue Ready Transfer Secondary Dressing: Wound #2 Right,Distal,Lateral Lower Leg: ABD pad XtraSorb Dressing Change Frequency: Wound #2 Right,Distal,Lateral Lower Leg: Dressing is to be changed Monday and Thursday. Follow-up Appointments: Other: - Monday and Thursday appointment Edema Control: Wound #2 Right,Distal,Lateral Lower Leg: 4-Layer Compression System - Right Lower Extremity - unna to anchor Elevate legs to the level of the heart and pump ankles as often as possible Additional Orders / Instructions: Wound #2 Right,Distal,Lateral Lower Leg: Vitamin A; Vitamin C, Zinc Increase protein intake. Other: - Please watch your salt (sodium) intake Laboratory ordered were: Wound culture routine - right lateral lower leg The following medication(s) was prescribed: doxycycline hyclate oral 100 mg capsule 1 1 capsule oral taken 2 times a day for 10 days starting 02/18/2018 Cipro oral 500 mg tablet 1 1 tablet oral taken 2 times a day for 10 days. Patient is to stop taking the Doxycycline starting 02/22/2018 I'm going to suggest at this point that we initiate treatment with doxycycline due to the fact that the patient does seem to likely have an infection in my opinion. She has done well with  the doxycycline before I also I'm going to be sending a culture for her and this was obtained today. If anything needs to be changed based on the culture results we will do so at that point. Otherwise I'm going to suggest that we continue with the Current wound care measures we will just see her back for reevaluation for a nurse visit on Thursday in order to rewrap her leg at that point due to the increase drainage. Patient is in agreement with this plan. Please see above for specific wound care orders. We will see patient for re-evaluation in 1 week(s) here in the clinic. If anything worsens or changes patient will contact our office for additional recommendations. [name], [name] ([id number]) Electronic Signature(s) Signed: 02/25/2018 8:21:37 AM By: Lenda KelpStone III, Ronell Boldin PA-C Previous Signature: 02/19/2018 8:40:52 AM Version By: Lenda KelpStone III, Desira Alessandrini PA-C Entered By: Lenda KelpStone III, Beaux Wedemeyer on 02/22/2018 18:06:02 Pricer, Uri R. ([id number]) -------------------------------------------------------------------------------- ROS/PFSH Details Patient Name: Jessica CaseyLEATH, Rowen R. Date of Service: 02/18/2018 9:15 AM Medical Record Number: [id number] Patient Account Number: [id number] Date of Birth/Sex: 1981/01/29 (37 y.o. F) Treating RN: Renne CriglerFlinchum, Cheryl Primary Care Provider: PATIENT, NO Other Clinician: Referring Provider: Referral, Self Treating Provider/Extender: Jessica III, Erian Lariviere Weeks in Treatment: 23 Information Obtained From Patient Wound History Do you currently have one or more open woundso Yes How many open wounds do you currently haveo 1 Approximately how long have you had your woundso 1 month How have you been treating your wound(s) until nowo aquacel ag Has your wound(s) ever healed and then re-openedo No Have you had any lab work done in the past montho No Have you tested positive for an antibiotic resistant organism (MRSA, VRE)o No Have you tested positive for osteomyelitis (bone infection)o No Have you had  any tests for circulation on your legso Yes Who ordered the testo [facility] Where was the test doneo [facility] Constitutional Symptoms (General Health) Complaints and Symptoms: Negative for: Fever; Chills Cardiovascular Complaints and Symptoms: Positive for: LE edema Medical History: Positive for: Hypertension Negative for: Angina; Arrhythmia; Congestive Heart Failure; Coronary Artery Disease; Deep Vein Thrombosis; Hypotension; Myocardial Infarction; Peripheral Arterial Disease; Peripheral Venous Disease; Phlebitis; Vasculitis Hematologic/Lymphatic Medical History: Positive for: Lymphedema Respiratory Complaints and Symptoms: No Complaints or Symptoms Medical History:  Negative for: Aspiration; Asthma; Chronic Obstructive Pulmonary Disease (COPD); Pneumothorax; Sleep Apnea; Tuberculosis Psychiatric Complaints and Symptoms: No Complaints or Symptoms Secord, Brinly R. ([id number]) Immunizations Pneumococcal Vaccine: Received Pneumococcal Vaccination: No Immunization Notes: up to date Implantable Devices Family and Social History Cancer: No; Diabetes: No; Heart Disease: No; Hereditary Spherocytosis: No; Hypertension: No; Kidney Disease: Yes - Mother; Lung Disease: No; Seizures: No; Stroke: No; Thyroid Problems: No; Tuberculosis: No; Never smoker; Marital Status - Married; Alcohol Use: Never; Drug Use: No History; Caffeine Use: Daily; Financial Concerns: No; Food, Clothing or Shelter Needs: No; Support System Lacking: No; Transportation Concerns: No; Advanced Directives: No; Patient does not want information on Advanced Directives Physician Affirmation I have reviewed and agree with the above information. Electronic Signature(s) Signed: 02/18/2018 5:00:26 PM By: Renne Crigler Signed: 02/19/2018 8:40:52 AM By: Lenda Kelp PA-C Entered By: Lenda Kelp on 02/18/2018 10:08:54 [name]  ([id number]) -------------------------------------------------------------------------------- SuperBill Details Patient Name: Jessica Casey R. Date of Service: 02/18/2018 Medical Record Number: [id number] Patient Account Number: [id number] Date of Birth/Sex: 10/13/1981 (37 y.o. 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 E66.01 Morbid (severe) obesity due to excess calories Facility Procedures CPT4 Code: 41324401 Description: 11042 - DEB SUBQ TISSUE 20 SQ CM/< ICD-10 Diagnosis Description L97.212 Non-pressure chronic ulcer of right calf with fat layer expo Modifier: sed Quantity: 1 Physician Procedures CPT4 Code: 0272536 Description: 99214 - WC PHYS LEVEL 4 - EST PT ICD-10 Diagnosis Description L97.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 I89.0 Lymphedema, not elsewhere classified I87.311 Chronic venous hypertension (idiopathic) with ulcer of  right l E66.01 Morbid (severe) obesity due to excess calories Modifier: 25 d ower extremity Quantity: 1 CPT4 Code: 6440347 Description: 11042 - WC PHYS SUBQ TISS 20 SQ CM ICD-10 Diagnosis Description L97.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 Modifier: d Quantity: 1 Electronic Signature(s) Signed: 02/19/2018 8:40:52 AM By: Lenda Kelp PA-C Entered By: Lenda Kelp on 02/18/2018 10:11:39

## 2018-02-21 ENCOUNTER — Ambulatory Visit: Payer: Self-pay

## 2018-02-22 LAB — AEROBIC CULTURE W GRAM STAIN (SUPERFICIAL SPECIMEN): Culture: 30000 — AB

## 2018-02-22 LAB — AEROBIC CULTURE  (SUPERFICIAL SPECIMEN)

## 2018-02-22 NOTE — Progress Notes (Signed)
CHARDAE, MULKERN ([id number]) Visit Report for 02/18/2018 Arrival Information Details Patient Name: [name], [name] Date of Service: 02/18/2018 9:15 AM Medical Record Number: [id number] Patient Account Number: [id number] Date of Birth/Sex: February 24, 1981 (37 y.o. F) Treating RN: Phillis Haggis Primary Care Rafan Sanders: PATIENT, NO Other Clinician: Referring Roslin Norwood: Referral, Self Treating Chan Sheahan/Extender: STONE III, HOYT Weeks in Treatment: 23 Visit Information History Since Last Visit All ordered tests and consults were completed: No Patient Arrived: Ambulatory Added or deleted any medications: No Arrival Time: 09:41 Any new allergies or adverse reactions: No Accompanied By: self Had a fall or experienced change in No Transfer Assistance: None activities of daily living that may affect Patient Identification Verified: Yes risk of falls: Secondary Verification Process Completed: Yes Signs or symptoms of abuse/neglect since last visito No Patient Requires Transmission-Based No Hospitalized since last visit: No Precautions: Implantable device outside of the clinic excluding No Patient Has Alerts: No cellular tissue based products placed in the center since last visit: Has Dressing in Place as Prescribed: Yes Has Compression in Place as Prescribed: Yes Pain Present Now: Yes Electronic Signature(s) Signed: 02/21/2018 4:32:30 PM By: [name] Entered By: [name] on 02/18/2018 09:42:14 [name], [name] ([id number]) -------------------------------------------------------------------------------- Encounter Discharge Information Details Patient Name: Jessica Casey R. Date of Service: 02/18/2018 9:15 AM Medical Record Number: [id number] Patient Account Number: [id number] Date of Birth/Sex: 09/17/1981 (37 y.o. F) Treating RN: Curtis Sites Primary Care Shayne Diguglielmo: PATIENT, NO Other Clinician: Referring Latravion Graves: Referral, Self Treating Laurianne Floresca/Extender: STONE III, HOYT Weeks in  Treatment: 23 Encounter Discharge Information Items Discharge Pain Level: 0 Discharge Condition: Stable Ambulatory Status: Ambulatory Discharge Destination: Home Private Transportation: Auto Accompanied By: self Schedule Follow-up Appointment: Yes Medication Reconciliation completed and provided No to Patient/Care Elexa Kivi: Clinical Summary of Care: Electronic Signature(s) Signed: 02/18/2018 10:48:54 AM By: Curtis Sites Entered By: Curtis Sites on 02/18/2018 10:48:54 Schleicher, Cristal Deer ([id number]) -------------------------------------------------------------------------------- Lower Extremity Assessment Details Patient Name: Jessica Casey R. Date of Service: 02/18/2018 9:15 AM Medical Record Number: [id number] Patient Account Number: [id number] Date of Birth/Sex: [birth year] (37 y.o. F) Treating RN: Phillis Haggis Primary Care Colyn Miron: PATIENT, NO Other Clinician: Referring Deloria Brassfield: Referral, Self Treating Issaac Shipper/Extender: STONE III, HOYT Weeks in Treatment: 23 Edema Assessment Assessed: [Left: No] [Right: No] [Left: Edema] [Right: :] Calf Left: Right: Point of Measurement: 35 cm From Medial Instep cm 58 cm Ankle Left: Right: Point of Measurement: 9 cm From Medial Instep cm 36.5 cm Vascular Assessment Pulses: Dorsalis Pedis Palpable: [Right:Yes] Posterior Tibial Extremity colors, hair growth, and conditions: Extremity Color: [Right:Hyperpigmented] Temperature of Extremity: [Right:Warm] Capillary Refill: [Right:< 3 seconds] Toe Nail Assessment Left: Right: Thick: Yes Discolored: Yes Deformed: Yes Improper Length and Hygiene: Yes Electronic Signature(s) Signed: 02/21/2018 4:32:30 PM By: [name] Entered By: [name] on 02/18/2018 09:48:37 [name], [name] ([id number]) -------------------------------------------------------------------------------- Multi Wound Chart Details Patient Name: Jessica Casey R. Date of Service: 02/18/2018 9:15 AM Medical  Record Number: [id number] Patient Account Number: [id number] Date of Birth/Sex: 12/21/1980 (37 y.o. F) Treating RN: Renne Crigler Primary Care Jarrel Knoke: PATIENT, NO Other Clinician: Referring Neilah Fulwider: Referral, Self Treating Anarely Nicholls/Extender: STONE III, HOYT Weeks in Treatment: 23 Vital Signs Height(in): 74 Pulse(bpm): 85 Weight(lbs): 505 Blood Pressure(mmHg): 157/78 Body Mass Index(BMI): 65 Temperature(F): 98.1 Respiratory Rate 18 (breaths/min): Photos: [2:No Photos] [N/A:N/A] Wound Location: [2:Right Lower Leg - Lateral, Distal] [N/A:N/A] Wounding Event: [2:Gradually Appeared] [N/A:N/A] Primary Etiology: [2:Lymphedema] [N/A:N/A] Comorbid History: [2:Lymphedema, Hypertension] [N/A:N/A] Date Acquired: [2:08/08/2017] [N/A:N/A] Weeks of Treatment: [2:23] [N/A:N/A] Wound Status: [2:Open] [N/A:N/A] Clustered Wound: [2:Yes] [  N/A:N/A] Clustered Quantity: [2:2] [N/A:N/A] Measurements L x W x D [2:2.5x4.5x0.2] [N/A:N/A] (cm) Area (cm) : [2:8.836] [N/A:N/A] Volume (cm) : [2:1.767] [N/A:N/A] % Reduction in Area: [2:58.80%] [N/A:N/A] % Reduction in Volume: [2:72.50%] [N/A:N/A] Classification: [2:Full Thickness With Exposed Support Structures] [N/A:N/A] Exudate Amount: [2:Large] [N/A:N/A] Exudate Type: [2:Serosanguineous] [N/A:N/A] Exudate Color: [2:red, brown] [N/A:N/A] Wound Margin: [2:Flat and Intact] [N/A:N/A] Granulation Amount: [2:Large (67-100%)] [N/A:N/A] Granulation Quality: [2:Red, Hyper-granulation] [N/A:N/A] Necrotic Amount: [2:Small (1-33%)] [N/A:N/A] Exposed Structures: [2:Fat Layer (Subcutaneous Tissue) Exposed: Yes Fascia: No Tendon: No Muscle: No Joint: No Bone: No] [N/A:N/A] Epithelialization: [2:Small (1-33%)] [N/A:N/A] Periwound Skin Texture: [2:Excoriation: Yes Scarring: Yes Induration: No] [N/A:N/A] Callus: No Crepitus: No Rash: No Periwound Skin Moisture: Maceration: Yes N/A N/A Dry/Scaly: No Periwound Skin Color: Atrophie Blanche: No N/A  N/A Cyanosis: No Ecchymosis: No Erythema: No Hemosiderin Staining: No Mottled: No Pallor: No Rubor: No Temperature: No Abnormality N/A N/A Tenderness on Palpation: Yes N/A N/A Wound Preparation: Ulcer Cleansing: N/A N/A Rinsed/Irrigated with Saline, Other: soap and water Topical Anesthetic Applied: Other: lidocaine 4% Treatment Notes Electronic Signature(s) Signed: 02/18/2018 5:00:26 PM By: Renne CriglerFlinchum, Cheryl Entered By: Renne CriglerFlinchum, Cheryl on 02/18/2018 09:58:10 Jessica HindLEATH, [name] ([id number]) -------------------------------------------------------------------------------- Multi-Disciplinary Care Plan Details Patient Name: Jessica CaseyLEATH, [name] Date of Service: 02/18/2018 9:15 AM Medical Record Number: [id number] Patient Account Number: [id number] Date of Birth/Sex: August 26, 1981 (37 y.o. F) Treating RN: Renne CriglerFlinchum, Cheryl Primary Care Aviya Jarvie: PATIENT, NO Other Clinician: Referring Robson Trickey: Referral, Self Treating Taydem Cavagnaro/Extender: STONE III, HOYT Weeks in Treatment: 23 Active Inactive ` Orientation to the Wound Care Program Nursing Diagnoses: Knowledge deficit related to the wound healing center program Goals: Patient/caregiver will verbalize understanding of the Wound Healing Center Program Date Initiated: 09/10/2017 Target Resolution Date: 11/23/2017 Goal Status: Active Interventions: Provide education on orientation to the wound center Notes: ` Venous Leg Ulcer Nursing Diagnoses: Knowledge deficit related to disease process and management Goals: Patient/caregiver will verbalize understanding of disease process and disease management Date Initiated: 09/10/2017 Target Resolution Date: 11/23/2017 Goal Status: Active Interventions: Assess peripheral edema status every visit. Notes: ` Wound/Skin Impairment Nursing Diagnoses: Impaired tissue integrity Goals: Ulcer/skin breakdown will heal within 14 weeks Date Initiated: 09/10/2017 Target Resolution Date: 11/24/2017 Goal Status:  Active Interventions: Jessica HindLEATH, [name] ([id number]) Assess patient/caregiver ability to obtain necessary supplies Assess patient/caregiver ability to perform ulcer/skin care regimen upon admission and as needed Assess ulceration(s) every visit Notes: Electronic Signature(s) Signed: 02/18/2018 5:00:26 PM By: Renne CriglerFlinchum, Cheryl Entered By: Renne CriglerFlinchum, Cheryl on 02/18/2018 10:00:21 Jessica HindLEATH, [name] ([id number]) -------------------------------------------------------------------------------- Pain Assessment Details Patient Name: Jessica CaseyLEATH, [name] Date of Service: 02/18/2018 9:15 AM Medical Record Number: [id number] Patient Account Number: [id number] Date of Birth/Sex: August 26, 1981 (37 y.o. F) Treating RN: Phillis HaggisPinkerton, Debi Primary Care Amarachukwu Lakatos: PATIENT, NO Other Clinician: Referring Nichollas Perusse: Referral, Self Treating Manly Nestle/Extender: STONE III, HOYT Weeks in Treatment: 23 Active Problems Location of Pain Severity and Description of Pain Patient Has Paino Yes Site Locations Pain Location: Pain in Ulcers Rate the pain. Current Pain Level: 3 Character of Pain Describe the Pain: Burning Pain Management and Medication Current Pain Management: Notes Topical or injectable lidocaine is offered to patient for acute pain when surgical debridement is performed. If needed, Patient is instructed to use over the counter pain medication for the following 24-48 hours after debridement. Wound care MDs do not prescribed pain medications. Patient has chronic pain or uncontrolled pain. Patient has been instructed to make an appointment with their Primary Care Physician for pain management. Electronic Signature(s) Signed: 02/21/2018 4:32:30 PM By: Alejandro MullingPinkerton, Debra  Entered By: [name] on 02/18/2018 09:42:42 [name] ([id number]) -------------------------------------------------------------------------------- Patient/Caregiver Education Details Patient Name: [name] Date of Service: 02/18/2018  9:15 AM Medical Record Number: [id number] Patient Account Number: [id number] Date of Birth/Gender: [birth year] (37 y.o. F) Treating RN: Curtis Sites Primary Care Physician: PATIENT, NO Other Clinician: Referring Physician: Referral, Self Treating Physician/Extender: Linwood Dibbles, HOYT Weeks in Treatment: 23 Education Assessment Education Provided To: Patient Education Topics Provided Nutrition: Handouts: Other: increased protein Methods: Explain/Verbal Responses: State content correctly Electronic Signature(s) Signed: 02/18/2018 5:08:18 PM By: Curtis Sites Entered By: Curtis Sites on 02/18/2018 10:49:14 [name], [name] ([id number]) -------------------------------------------------------------------------------- Wound Assessment Details Patient Name: Jessica Casey R. Date of Service: 02/18/2018 9:15 AM Medical Record Number: [id number] Patient Account Number: [id number] Date of Birth/Sex: 11-13-1981 (37 y.o. F) Treating RN: Phillis Haggis Primary Care Andersen Mckiver: PATIENT, NO Other Clinician: Referring Feliz Herard: Referral, Self Treating Tanav Orsak/Extender: STONE III, HOYT Weeks in Treatment: 23 Wound Status Wound Number: 2 Primary Etiology: Lymphedema Wound Location: Right Lower Leg - Lateral, Distal Wound Status: Open Wounding Event: Gradually Appeared Comorbid History: Lymphedema, Hypertension Date Acquired: 08/08/2017 Weeks Of Treatment: 23 Clustered Wound: Yes Photos Photo Uploaded By: Renne Crigler on 02/18/2018 16:47:17 Wound Measurements Length: (cm) 2.5 Width: (cm) 4.5 Depth: (cm) 0.2 Clustered Quantity: 2 Area: (cm) 8.836 Volume: (cm) 1.767 % Reduction in Area: 58.8% % Reduction in Volume: 72.5% Epithelialization: Small (1-33%) Tunneling: No Undermining: No Wound Description Full Thickness With Exposed Support Classification: Structures Wound Margin: Flat and Intact Exudate Large Amount: Exudate Type: Serosanguineous Exudate Color: red, brown Foul  Odor After Cleansing: No Slough/Fibrino Yes Wound Bed Granulation Amount: Large (67-100%) Exposed Structure Granulation Quality: Red, Hyper-granulation Fascia Exposed: No Necrotic Amount: Small (1-33%) Fat Layer (Subcutaneous Tissue) Exposed: Yes Necrotic Quality: Adherent Slough Tendon Exposed: No Muscle Exposed: No Joint Exposed: No [name], [name] ([id number]) Bone Exposed: No Periwound Skin Texture Texture Color No Abnormalities Noted: No No Abnormalities Noted: Yes Callus: No Temperature / Pain Crepitus: No Temperature: No Abnormality Excoriation: Yes Tenderness on Palpation: Yes Induration: No Rash: No Scarring: Yes Moisture No Abnormalities Noted: No Dry / Scaly: No Maceration: Yes Wound Preparation Ulcer Cleansing: Rinsed/Irrigated with Saline, Other: soap and water, Topical Anesthetic Applied: Other: lidocaine 4%, Treatment Notes Wound #2 (Right, Distal, Lateral Lower Leg) 1. Cleansed with: Cleanse wound with antibacterial soap and water 2. Anesthetic Topical Lidocaine 4% cream to wound bed prior to debridement 3. Peri-wound Care: Barrier cream Moisturizing lotion 4. Dressing Applied: Hydrafera Blue Other dressing (specify in notes) 7. Secured with 4-Layer Compression System - Right Lower Extremity Notes xtrasorb, unna to Ecologist) Signed: 02/21/2018 4:32:30 PM By: [name] Entered By: [name] on 02/18/2018 09:54:08 [name], [name] ([id number]) -------------------------------------------------------------------------------- Vitals Details Patient Name: Jessica Casey R. Date of Service: 02/18/2018 9:15 AM Medical Record Number: [id number] Patient Account Number: [id number] Date of Birth/Sex: 21-Feb-1981 (37 y.o. F) Treating RN: Phillis Haggis Primary Care Nyna Chilton: PATIENT, NO Other Clinician: Referring Rylan Bernard: Referral, Self Treating Feven Alderfer/Extender: STONE III, HOYT Weeks in Treatment: 23 Vital Signs Time  Taken: 09:42 Temperature (F): 98.1 Height (in): 74 Pulse (bpm): 85 Weight (lbs): 505 Respiratory Rate (breaths/min): 18 Body Mass Index (BMI): 64.8 Blood Pressure (mmHg): 157/78 Reference Range: 80 - 120 mg / dl Electronic Signature(s) Signed: 02/21/2018 4:32:30 PM By: [name] Entered By: [name] on 02/18/2018 09:51:52

## 2018-02-25 ENCOUNTER — Encounter: Payer: Self-pay | Admitting: Physician Assistant

## 2018-02-27 NOTE — Progress Notes (Signed)
PACEY, WILLADSEN ([id number]) Visit Report for 02/25/2018 Arrival Information Details Patient Name: [name], [name] Date of Service: 02/25/2018 8:45 AM Medical Record Number: [id number] Patient Account Number: [id number] Date of Birth/Sex: 04-20-1981 (37 y.o. F) Treating RN: Curtis Sites Primary Care Elody [name]: PATIENT, NO Other Clinician: Referring Nyasia Baxley: Referral, Self Treating Matei Magnone/Extender: STONE III, HOYT Weeks in Treatment: 24 Visit Information History Since Last Visit Added or deleted any medications: No Patient Arrived: Ambulatory Any new allergies or adverse reactions: No Arrival Time: 09:07 Had a fall or experienced change in No Accompanied By: self activities of daily living that may affect Transfer Assistance: None risk of falls: Patient Identification Verified: Yes Signs or symptoms of abuse/neglect since last visito No Secondary Verification Process Completed: Yes Hospitalized since last visit: No Patient Requires Transmission-Based No Implantable device outside of the clinic excluding No Precautions: cellular tissue based products placed in the center Patient Has Alerts: No since last visit: Has Dressing in Place as Prescribed: Yes Has Compression in Place as Prescribed: Yes Pain Present Now: No Electronic Signature(s) Signed: 02/25/2018 4:12:39 PM By: Curtis Sites Entered By: Curtis Sites on 02/25/2018 09:07:24 [name] ([id number]) -------------------------------------------------------------------------------- Encounter Discharge Information Details Patient Name: Jessica Casey R. Date of Service: 02/25/2018 8:45 AM Medical Record Number: [id number] Patient Account Number: [id number] Date of Birth/Sex: 05-19-1981 (37 y.o. F) Treating RN: Phillis Haggis Primary Care Chinedum Vanhouten: PATIENT, NO Other Clinician: Referring Drevin Ortner: Referral, Self Treating Bonifacio Pruden/Extender: STONE III, HOYT Weeks in Treatment: 24 Encounter Discharge Information  Items Discharge Pain Level: 0 Discharge Condition: Stable Ambulatory Status: Ambulatory Discharge Destination: Home Transportation: Private Auto Accompanied By: self Schedule Follow-up Appointment: Yes Medication Reconciliation completed and No provided to Patient/Care Isaul Landi: Patient Clinical Summary of Care: Declined Electronic Signature(s) Signed: 02/26/2018 4:31:13 PM By: Gwenlyn Perking Entered By: Gwenlyn Perking on 02/25/2018 10:07:53 Seeley, Ivionna R. ([id number]) -------------------------------------------------------------------------------- Lower Extremity Assessment Details Patient Name: Jessica Casey R. Date of Service: 02/25/2018 8:45 AM Medical Record Number: [id number] Patient Account Number: [id number] Date of Birth/Sex: 04/25/1981 (37 y.o. F) Treating RN: Curtis Sites Primary Care Damali [name]: PATIENT, NO Other Clinician: Referring Ekaterini [name]: Referral, Self Treating Tovah [name]/Extender: STONE III, HOYT Weeks in Treatment: 24 Edema Assessment Assessed: [Left: No] [Right: No] [Left: Edema] [Right: :] Calf Left: Right: Point of Measurement: 35 cm From Medial Instep cm 57.3 cm Ankle Left: Right: Point of Measurement: 9 cm From Medial Instep cm 36.3 cm Vascular Assessment Pulses: Dorsalis Pedis Palpable: [Right:Yes] Posterior Tibial Extremity colors, hair growth, and conditions: Extremity Color: [Right:Hyperpigmented] Hair Growth on Extremity: [Right:Yes] Temperature of Extremity: [Right:Warm] Capillary Refill: [Right:< 3 seconds] Toe Nail Assessment Left: Right: Thick: Yes Discolored: Yes Deformed: Yes Improper Length and Hygiene: Yes Electronic Signature(s) Signed: 02/25/2018 4:12:39 PM By: Curtis Sites Entered By: Curtis Sites on 02/25/2018 09:13:36 Chill, Stormie R. ([id number]) -------------------------------------------------------------------------------- Multi Wound Chart Details Patient Name: Jessica Casey R. Date of Service: 02/25/2018 8:45 AM Medical  Record Number: [id number] Patient Account Number: [id number] Date of Birth/Sex: 09/23/1981 (37 y.o. F) Treating RN: Curtis Sites Primary Care [place] [name]: PATIENT, NO Other Clinician: Referring Garlon [name]: Referral, Self Treating Corayma [name]/Extender: STONE III, HOYT Weeks in Treatment: 24 Vital Signs Height(in): 74 Pulse(bpm): 96 Weight(lbs): 505 Blood Pressure(mmHg): 146/87 Body Mass Index(BMI): 65 Temperature(F): 97.7 Respiratory Rate 18 (breaths/min): Photos: [N/A:N/A] Wound Location: Right Lower Leg - Lateral, N/A N/A Distal Wounding Event: Gradually Appeared N/A N/A Primary Etiology: Lymphedema N/A N/A Comorbid History: Lymphedema, Hypertension N/A N/A Date Acquired: 08/08/2017 N/A N/A Weeks of Treatment: 24 N/A N/A Wound Status: Open N/A  N/A Clustered Wound: Yes N/A N/A Clustered Quantity: 2 N/A N/A Measurements L x W x D 2.3x3.4x0.2 N/A N/A (cm) Area (cm) : 6.142 N/A N/A Volume (cm) : 1.228 N/A N/A % Reduction in Area: 71.40% N/A N/A % Reduction in Volume: 80.90% N/A N/A Classification: Full Thickness With Exposed N/A N/A Support Structures Exudate Amount: Large N/A N/A Exudate Type: Serosanguineous N/A N/A Exudate Color: red, brown N/A N/A Wound Margin: Flat and Intact N/A N/A Granulation Amount: Large (67-100%) N/A N/A Granulation Quality: Red, Hyper-granulation [name]: Small (1-33%) N/A N/A Exposed Structures: Fat Layer (Subcutaneous N/A N/A Tissue) Exposed: Yes Fascia: No Tendon: No Knick, [name] ([id number]) Muscle: No Joint: No Bone: No Epithelialization: Small (1-33%) N/A N/A Periwound Skin Texture: Excoriation: Yes N/A N/A Scarring: Yes Induration: No Callus: No Crepitus: No Rash: No Periwound Skin Moisture: Maceration: Yes N/A N/A Dry/Scaly: No Periwound Skin Color: Atrophie Blanche: No N/A N/A Cyanosis: No Ecchymosis: No Erythema: No Hemosiderin Staining: No Mottled: No Pallor: No Rubor: No Temperature: No  Abnormality N/A N/A Tenderness on Palpation: Yes N/A N/A Wound Preparation: Ulcer Cleansing: N/A N/A Rinsed/Irrigated with Saline, Other: soap and water Topical Anesthetic Applied: Other: lidocaine 4% Treatment Notes Electronic Signature(s) Signed: 02/25/2018 4:12:39 PM By: Curtis Sites Entered By: Curtis Sites on 02/25/2018 09:49:17 [name] ([id number]) -------------------------------------------------------------------------------- Multi-Disciplinary Care Plan Details Patient Name: Jessica Casey R. Date of Service: 02/25/2018 8:45 AM Medical Record Number: [id number] Patient Account Number: [id number] Date of Birth/Sex: 12-23-1980 (37 y.o. F) Treating RN: Curtis Sites Primary Care Lujean Ebright: PATIENT, NO Other Clinician: Referring Darly Massi: Referral, Self Treating Tamsen Reist/Extender: STONE III, HOYT Weeks in Treatment: 24 Active Inactive ` Orientation to the Wound Care Program Nursing Diagnoses: Knowledge deficit related to the wound healing center program Goals: Patient/caregiver will verbalize understanding of the Wound Healing Center Program Date Initiated: 09/10/2017 Target Resolution Date: 11/23/2017 Goal Status: Active Interventions: Provide education on orientation to the wound center Notes: ` Venous Leg Ulcer Nursing Diagnoses: Knowledge deficit related to disease process and management Goals: Patient/caregiver will verbalize understanding of disease process and disease management Date Initiated: 09/10/2017 Target Resolution Date: 11/23/2017 Goal Status: Active Interventions: Assess peripheral edema status every visit. Notes: ` Wound/Skin Impairment Nursing Diagnoses: Impaired tissue integrity Goals: Ulcer/skin breakdown will heal within 14 weeks Date Initiated: 09/10/2017 Target Resolution Date: 11/24/2017 Goal Status: Active Interventions: LUMEN, BRINLEE ([id number]) Assess patient/caregiver ability to obtain necessary supplies Assess  patient/caregiver ability to perform ulcer/skin care regimen upon admission and as needed Assess ulceration(s) every visit Notes: Electronic Signature(s) Signed: 02/25/2018 4:12:39 PM By: Curtis Sites Entered By: Curtis Sites on 02/25/2018 09:49:02 Yankee, Peityn R. ([id number]) -------------------------------------------------------------------------------- Pain Assessment Details Patient Name: Jessica Casey R. Date of Service: 02/25/2018 8:45 AM Medical Record Number: [id number] Patient Account Number: [id number] Date of Birth/Sex: August 17, 1981 (37 y.o. F) Treating RN: Curtis Sites Primary Care Bronislaus Verdell: PATIENT, NO Other Clinician: Referring Zephaniah Lubrano: Referral, Self Treating Giamarie Bueche/Extender: STONE III, HOYT Weeks in Treatment: 24 Active Problems Location of Pain Severity and Description of Pain Patient Has Paino No Site Locations Pain Management and Medication Current Pain Management: Notes Topical or injectable lidocaine is offered to patient for acute pain when surgical debridement is performed. If needed, Patient is instructed to use over the counter pain medication for the following 24-48 hours after debridement. Wound care MDs do not prescribed pain medications. Patient has chronic pain or uncontrolled pain. Patient has been instructed to make an appointment with their Primary Care Physician for pain management. Electronic Signature(s)  Signed: 02/25/2018 4:12:39 PM By: Curtis Sitesorthy, Joanna Entered By: Curtis Sitesorthy, Joanna on 02/25/2018 09:08:47 Marlaine HindLEATH, Amberlie R. ([id number]) -------------------------------------------------------------------------------- Patient/Caregiver Education Details Patient Name: Jessica CaseyLEATH, Amauri R. Date of Service: 02/25/2018 8:45 AM Medical Record Number: [id number] Patient Account Number: [id number] Date of Birth/Gender: 1981-05-07 (37 y.o. F) Treating RN: Phillis HaggisPinkerton, Debi Primary Care Physician: PATIENT, NO Other Clinician: Referring Physician: Referral, Self Treating  Physician/Extender: Linwood DibblesSTONE III, HOYT Weeks in Treatment: 24 Education Assessment Education Provided To: Patient Education Topics Provided Wound/Skin Impairment: Handouts: Caring for Your Ulcer, Other: do not get wrap wet Methods: Demonstration, Explain/Verbal Responses: State content correctly Electronic Signature(s) Signed: 02/25/2018 4:32:12 PM By: Alejandro MullingPinkerton, Debra Entered By: Alejandro MullingPinkerton, Debra on 02/25/2018 10:05:08 Alkhatib, Chrysta R. ([id number]) -------------------------------------------------------------------------------- Wound Assessment Details Patient Name: Jessica CaseyLEATH, Orah R. Date of Service: 02/25/2018 8:45 AM Medical Record Number: [id number] Patient Account Number: [id number] Date of Birth/Sex: 1981-05-07 (37 y.o. F) Treating RN: Curtis Sitesorthy, Joanna Primary Care Mahsa Hanser: PATIENT, NO Other Clinician: Referring Anyah Swallow: Referral, Self Treating Renelda Kilian/Extender: STONE III, HOYT Weeks in Treatment: 24 Wound Status Wound Number: 2 Primary Etiology: Lymphedema Wound Location: Right Lower Leg - Lateral, Distal Wound Status: Open Wounding Event: Gradually Appeared Comorbid History: Lymphedema, Hypertension Date Acquired: 08/08/2017 Weeks Of Treatment: 24 Clustered Wound: Yes Photos Wound Measurements Length: (cm) 2.3 % Reduct Width: (cm) 3.4 % Reduct Depth: (cm) 0.2 Epitheli Clustered Quantity: 2 Tunnelin Area: (cm) 6.142 Undermi Volume: (cm) 1.228 ion in Area: 71.4% ion in Volume: 80.9% alization: Small (1-33%) g: No ning: No Wound Description Full Thickness With Exposed Support Foul Odo Classification: Structures Slough/F Wound Margin: Flat and Intact Exudate Large Amount: Exudate Type: Serosanguineous Exudate Color: red, brown r After Cleansing: No ibrino Yes Wound Bed Granulation Amount: Large (67-100%) Exposed Structure Granulation Quality: Red, Hyper-granulation Fascia Exposed: No Necrotic Amount: Small (1-33%) Fat Layer (Subcutaneous Tissue) Exposed:  Yes Necrotic Quality: Adherent Slough Tendon Exposed: No Muscle Exposed: No Joint Exposed: No Bone Exposed: No Grewe, Talishia R. ([id number]) Periwound Skin Texture Texture Color No Abnormalities Noted: No No Abnormalities Noted: Yes Callus: No Temperature / Pain Crepitus: No Temperature: No Abnormality Excoriation: Yes Tenderness on Palpation: Yes Induration: No Rash: No Scarring: Yes Moisture No Abnormalities Noted: No Dry / Scaly: No Maceration: Yes Wound Preparation Ulcer Cleansing: Rinsed/Irrigated with Saline, Other: soap and water, Topical Anesthetic Applied: Other: lidocaine 4%, Treatment Notes Wound #2 (Right, Distal, Lateral Lower Leg) 1. Cleansed with: Clean wound with Normal Saline Cleanse wound with antibacterial soap and water 2. Anesthetic Topical Lidocaine 4% cream to wound bed prior to debridement 4. Dressing Applied: Hydrafera Blue 5. Secondary Dressing Applied ABD Pad 7. Secured with Tape 4-Layer Compression System - Right Lower Extremity Notes xtrasorb, unna to Ecologist[employer] Electronic Signature(s) Signed: 02/25/2018 4:12:39 PM By: Curtis Sitesorthy, Joanna Entered By: Curtis Sitesorthy, Joanna on 02/25/2018 09:42:02 Kenedy, Nilam R. ([id number]) -------------------------------------------------------------------------------- Vitals Details Patient Name: Jessica CaseyLEATH, Jennavieve R. Date of Service: 02/25/2018 8:45 AM Medical Record Number: [id number] Patient Account Number: [id number] Date of Birth/Sex: 1981-05-07 (37 y.o. F) Treating RN: Curtis Sitesorthy, Joanna Primary Care Naryah Clenney: PATIENT, NO Other Clinician: Referring Alijah Akram: Referral, Self Treating Cavon Nicolls/Extender: STONE III, HOYT Weeks in Treatment: 24 Vital Signs Time Taken: 09:08 Temperature (F): 97.7 Height (in): 74 Pulse (bpm): 96 Weight (lbs): 505 Respiratory Rate (breaths/min): 18 Body Mass Index (BMI): 64.8 Blood Pressure (mmHg): 146/87 Reference Range: 80 - 120 mg / dl Electronic Signature(s) Signed: 02/25/2018 4:12:39  PM By: Curtis Sitesorthy, Joanna Entered By: Curtis Sitesorthy, Joanna on 02/25/2018 09:09:47

## 2018-02-27 NOTE — Progress Notes (Signed)
[name], Ramani R. ([id number]) Visit Report for 02/25/2018 Chief Complaint Document Details Patient Name: [name], Shanin R. Date of Service: 02/25/2018 8:45 AM Medical Record Number: [id number] Patient Account Number: [id number] Date of Birth/Sex: [birth year] (37 y.o. F) Treating RN: Renne CriglerFlinchum, Cheryl Primary Care Provider: PATIENT, NO Other Clinician: Referring Provider: Referral, Self Treating Provider/Extender: STONE III, HOYT Weeks in Treatment: 24 Information Obtained from: Patient Chief Complaint She is here in follow up evaluation of right lower extremity ulcers Electronic Signature(s) Signed: 02/26/2018 8:17:58 AM By: Lenda KelpStone III, Hoyt PA-C Entered By: Lenda KelpStone III, Hoyt on 02/25/2018 09:06:01 Locklin, Kenedi R. ([id number]) -------------------------------------------------------------------------------- Debridement Details Patient Name: [name], Samreet R. Date of Service: 02/25/2018 8:45 AM Medical Record Number: [id number] Patient Account Number: [id number] Date of Birth/Sex: [birth year] (37 y.o. F) Treating RN: Curtis Sitesorthy, Joanna Primary Care Provider: PATIENT, NO Other Clinician: Referring Provider: Referral, Self Treating Provider/Extender: STONE III, HOYT Weeks in Treatment: 24 Debridement Performed for Wound #2 Right,Distal,Lateral Lower Leg Assessment: Performed By: Physician STONE III, HOYT E., PA-C Debridement Type: Debridement Pre-procedure Verification/Time Yes - 09:50 Out Taken: Start Time: 09:50 Pain Control: Other : lidocaine 4% Total Area Debrided (L x W): 2.3 (cm) x 3.4 (cm) = 7.82 (cm) Tissue and other material Viable, Non-Viable, Slough, Subcutaneous, Biofilm, Slough debrided: Level: Skin/Subcutaneous Tissue Debridement Description: Excisional Instrument: Curette Bleeding: Minimum Hemostasis Achieved: Pressure End Time: 09:50 Procedural Pain: 0 Post Procedural Pain: 0 Response to Treatment: Procedure was tolerated well Post Debridement Measurements of Total  Wound Length: (cm) 2.3 Width: (cm) 3.4 Depth: (cm) 0.3 Volume: (cm) 1.843 Character of Wound/Ulcer Post Debridement: Stable Post Procedure Diagnosis Same as Pre-procedure Electronic Signature(s) Signed: 02/25/2018 4:12:39 PM By: Curtis Sitesorthy, Joanna Signed: 02/26/2018 8:17:58 AM By: Lenda KelpStone III, Hoyt PA-C Entered By: Curtis Sitesorthy, Joanna on 02/25/2018 09:51:00 Tupper, Yailine R. ([id number]) -------------------------------------------------------------------------------- HPI Details Patient Name: [name], [name] Date of Service: 02/25/2018 8:45 AM Medical Record Number: [id number] Patient Account Number: [id number] Date of Birth/Sex: [birth year] (37 y.o. F) Treating RN: Renne CriglerFlinchum, Cheryl Primary Care Provider: PATIENT, NO Other Clinician: Referring Provider: Referral, Self Treating Provider/Extender: STONE III, HOYT Weeks in Treatment: 24 History of Present Illness HPI Description: 37 year old patient [facility] where she has been seen since 2016 for bilateral lower extremity venous insufficiency disease with lymphedema and multiple ulcerations associated with morbid obesity. she had custom-made compression stockings and lymphedema pumps which were used in the past. most recently she was admitted to the hospital between October 11 and 09/02/2017 with sepsis, lower extremity wounds and lymphedema.she was initially treated in the outpatient with Keflex and Bactrim. she was initially treated in the hospital with vancomycin and Zosyn and changed over to Unasyn until her white count improved and her blood cultures were negative for 3 days. After her inpatient management she was discharged home on Augmentin to end on 09/13/2017 with a 14 day course. she has had outpatient vascular duplex scans completed in November 2017 and her right ABI was 1.1 and the left ABI is 1.3. she had normal toe brachial indices bilaterally.she had three-vessel runoff in the right lower extremity and  two-vessel runoff in the left lower extremity. On questioning the patient she does have custom made compression stockings and also has a lymphedema pump but has not been using it appropriately and has not been taking good care of herself. 09/17/2017 -- she returns today with compression stockings on the left side and the right side has had significant amount of drainage and has a very strong odor  09/24/2017 -- the drainage is increased significantly and she has more lymphedema and a very strong odor to her wound. Though she does not have systemic symptoms, or overt infection I believe she will benefit from some doxycycline given empirically. 10/01/2017 -- after starting the doxycycline and changing the dressing twice a week her symptoms and signs have definitely improved overall. 10/08/2017 -- she has completed her course of doxycycline but continues to have a lot of drainage and needs twice a week dressing changes. 11/08/17-she is here in follow-up evaluation for right lower extremity ulcers. She admits to using her lymphedema pumps twice daily, one hour per session. she is voicing no complaints or concerns, no signs of infection will change to [facility] 12/14/17 on evaluation today patient appears to be doing very well in regard to her wounds. She has been tolerating the dressing changes she continues to develop some portly the adherent granular tissue on the surface of the wound with some Slough. Obviously we are trying to get too much better wound bed. With that being said the hyper granulation the Hydrofera Blue Dressing to have helped with which is excellent news. However I think it may be time to try something a little bit different at this point. 01/11/18 on evaluation today patient appears to be doing fairly well in regard to her right lateral lower extremity ulcers. This shows excellent signs of filling in which is great news. There does not appear to be any evidence of infection which is  also good news. She does continue to work as well is good school. She is having no pain. 01/22/18 on evaluation today patient appears to be doing a little bit worse in my opinion in regard to the overall quality of that granulation on her right lower extremity. She was not here last week due to being sick with a stomach virus this may have something to do with the fact that her wound appears to be a little bit worse. With that being said I'm also thinking that after switching from the [facility] Dressing to the silver collagen would really has not looked that's good in my opinion. We may want to swit 02/11/18 on evaluation today patient's right lower/lateral lower extremity ulcers appear to be doing very well at this point. Och, Bethzy R. ([id number]) Especially the more proximal ulcer has filled in much closer to surface which is good news. Nonetheless both show signs of improvement which is great news. There does not appear to be any evidence of infection which is also good news. In general patient has been doing well tolerating the wraps as well as the [employer]. 02/18/18 on evaluation today patient appears to be doing a little bit worse in regard to the periwound region the wounds themselves do not look much deteriorated to me. With that being said she has several small blisters/pustules noted in the periwound and there was a significant amount of drainage and maceration compared to previous. There has been a time that we had to bring her back for twice a week dressing changes as far as her wrap was concerned it has been a while since we've done that however. With that being said the patient has been having some burning and in general I'm concerned about the possibility of infection. She has previously taken doxycycline with good result. Fortunately there does not appear to be any evidence of overall worsening in regard to the size of the wound and in fact the upper wound actually  appears to be showing signs of good epithelialization. 02/25/18 on evaluation today patient actually does appear to be doing a little bit better in regard to swelling compared to last week's evaluation. She has been tolerating the dressing changes without complication which is good news. With that being said wound at this point still appears to be a little erythematous surrounding the wound and this does have me still concerned about the possibility of infection unfortunately number we had for her was incorrect when I was intention to call her of the week end as well is last Friday I was not able to get through to alert her that she had a new prescription at the pharmacy. Therefore patient has not been able to start her new scription yet. Electronic Signature(s) Signed: 02/26/2018 8:17:58 AM By: Lenda Kelp PA-C Entered By: Lenda Kelp on 02/26/2018 07:45:19 [name] ([id number]) -------------------------------------------------------------------------------- Physical Exam Details Patient Name: Jessica Casey R. Date of Service: 02/25/2018 8:45 AM Medical Record Number: [id number] Patient Account Number: [id number] Date of Birth/Sex: [birth year] (37 y.o. F) Treating RN: Renne Crigler Primary Care Provider: PATIENT, NO Other Clinician: Referring Provider: Referral, Self Treating Provider/Extender: STONE III, HOYT Weeks in Treatment: 24 Constitutional Well-nourished and well-hydrated in no acute distress. Respiratory normal breathing without difficulty. clear to auscultation bilaterally. Cardiovascular regular rate and rhythm with normal S1, S2. 1+ pitting edema of the bilateral lower extremities. Psychiatric this patient is able to make decisions and demonstrates good insight into disease process. Alert and Oriented x 3. pleasant and cooperative. Notes At this point patient's wound does appear to be doing better especially the superior wound she still has swelling though not  as significant as last week. Both wounds did require sharp debridement today and post debridement they did appear to be doing significantly better is good news. Electronic Signature(s) Signed: 02/26/2018 8:17:58 AM By: Lenda Kelp PA-C Entered By: Lenda Kelp on 02/26/2018 07:46:15 [name] ([id number]) -------------------------------------------------------------------------------- Physician Orders Details Patient Name: Jessica Casey R. Date of Service: 02/25/2018 8:45 AM Medical Record Number: [id number] Patient Account Number: [id number] Date of Birth/Sex: 1981/08/30 (37 y.o. F) Treating RN: Curtis Sites Primary Care Provider: PATIENT, NO Other Clinician: Referring Provider: Referral, Self Treating Provider/Extender: STONE III, HOYT Weeks in Treatment: 24 Verbal / Phone Orders: No Diagnosis Coding ICD-10 Coding Code Description [phone] 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d I89.0 Lymphedema, not elsewhere classified I87.311 Chronic venous hypertension (idiopathic) with ulcer of right lower extremity E66.01 Morbid (severe) obesity due to excess calories Wound Cleansing Wound #2 Right,Distal,Lateral Lower Leg o Clean wound with Normal Saline. o Cleanse wound with mild soap and water Anesthetic (add to Medication List) Wound #2 Right,Distal,Lateral Lower Leg o Topical Lidocaine 4% cream applied to wound bed prior to debridement (In Clinic Only). Skin Barriers/Peri-Wound Care Wound #2 Right,Distal,Lateral Lower Leg o Barrier cream o Moisturizing lotion Primary Wound Dressing Wound #2 Right,Distal,Lateral Lower Leg o Hydrafera Blue Ready Transfer Secondary Dressing Wound #2 Right,Distal,Lateral Lower Leg o ABD pad o XtraSorb Dressing Change Frequency Wound #2 Right,Distal,Lateral Lower Leg o Dressing is to be changed Monday and Thursday. Follow-up Appointments o Other: - Monday and Thursday appointment Edema  Control Wound #2 Right,Distal,Lateral Lower Leg o 4-Layer Compression System - Right Lower Extremity - unna to anchor o Elevate legs to the level of the heart and pump ankles as often as possible Maye, Arora R. ([id number]) Additional Orders / Instructions Wound #2 Right,Distal,Lateral Lower Leg o Vitamin A; Vitamin  C, Zinc o Increase protein intake. o Other: - Please watch your salt (sodium) intake Electronic Signature(s) Signed: 02/25/2018 4:12:39 PM By: Curtis Sites Signed: 02/26/2018 8:17:58 AM By: Lenda Kelp PA-C Entered By: Curtis Sites on 02/25/2018 09:54:32 Strandberg, Tonita R. ([id number]) -------------------------------------------------------------------------------- Problem List Details Patient Name: Jessica Casey R. Date of Service: 02/25/2018 8:45 AM Medical Record Number: [id number] Patient Account Number: [id number] Date of Birth/Sex: [birth year] (37 y.o. F) Treating RN: Renne Crigler Primary Care Provider: PATIENT, NO Other Clinician: Referring Provider: Referral, Self Treating Provider/Extender: Linwood Dibbles, HOYT Weeks in Treatment: 24 Active Problems ICD-10 Impacting Encounter Code Description Active Date Wound Healing Diagnosis L97.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d 09/10/2017 Yes I89.0 Lymphedema, not elsewhere classified 09/10/2017 Yes I87.311 Chronic venous hypertension (idiopathic) with ulcer of right 09/10/2017 Yes lower extremity E66.01 Morbid (severe) obesity due to excess calories 09/10/2017 Yes Inactive Problems Resolved Problems Electronic Signature(s) Signed: 02/26/2018 8:17:58 AM By: Lenda Kelp PA-C Entered By: Lenda Kelp on 02/25/2018 09:05:55 Cass, Dewey R. ([id number]) -------------------------------------------------------------------------------- Progress Note Details Patient Name: Jessica Casey R. Date of Service: 02/25/2018 8:45 AM Medical Record Number: [id number] Patient Account Number:  [id number] Date of Birth/Sex: 10-05-1981 (37 y.o. F) Treating RN: Renne Crigler Primary Care Provider: PATIENT, NO Other Clinician: Referring Provider: Referral, Self Treating Provider/Extender: STONE III, HOYT Weeks in Treatment: 24 Subjective Chief Complaint Information obtained from Patient She is here in follow up evaluation of right lower extremity ulcers History of Present Illness (HPI) 37 year old patient well known to our [facility] wound care clinic where she has been seen since 2016 for bilateral lower extremity venous insufficiency disease with lymphedema and multiple ulcerations associated with morbid obesity. she had custom-made compression stockings and lymphedema pumps which were used in the past. most recently she was admitted to the hospital between October 11 and 09/02/2017 with sepsis, lower extremity wounds and lymphedema.she was initially treated in the outpatient with Keflex and Bactrim. she was initially treated in the hospital with vancomycin and Zosyn and changed over to Unasyn until her white count improved and her blood cultures were negative for 3 days. After her inpatient management she was discharged home on Augmentin to end on 09/13/2017 with a 14 day course. she has had outpatient vascular duplex scans completed in November 2017 and her right ABI was 1.1 and the left ABI is 1.3. she had normal toe brachial indices bilaterally.she had three-vessel runoff in the right lower extremity and two-vessel runoff in the left lower extremity. On questioning the patient she does have custom made compression stockings and also has a lymphedema pump but has not been using it appropriately and has not been taking good care of herself. 09/17/2017 -- she returns today with compression stockings on the left side and the right side has had significant amount of drainage and has a very strong odor 09/24/2017 -- the drainage is increased significantly and she has more  lymphedema and a very strong odor to her wound. Though she does not have systemic symptoms, or overt infection I believe she will benefit from some doxycycline given empirically. 10/01/2017 -- after starting the doxycycline and changing the dressing twice a week her symptoms and signs have definitely improved overall. 10/08/2017 -- she has completed her course of doxycycline but continues to have a lot of drainage and needs twice a week dressing changes. 11/08/17-she is here in follow-up evaluation for right lower extremity ulcers. She admits to using her lymphedema pumps twice daily, one hour  per session. she is voicing no complaints or concerns, no signs of infection will change to [facility] 12/14/17 on evaluation today patient appears to be doing very well in regard to her wounds. She has been tolerating the dressing changes she continues to develop some portly the adherent granular tissue on the surface of the wound with some Slough. Obviously we are trying to get too much better wound bed. With that being said the hyper granulation the Hydrofera Blue Dressing to have helped with which is excellent news. However I think it may be time to try something a little bit different at this point. 01/11/18 on evaluation today patient appears to be doing fairly well in regard to her right lateral lower extremity ulcers. This shows excellent signs of filling in which is great news. There does not appear to be any evidence of infection which is also good news. She does continue to work as well is good school. She is having no pain. 01/22/18 on evaluation today patient appears to be doing a little bit worse in my opinion in regard to the overall quality of that Farnan, Sonji R. ([id number]) granulation on her right lower extremity. She was not here last week due to being sick with a stomach virus this may have something to do with the fact that her wound appears to be a little bit worse. With that being said I'm  also thinking that after switching from the [facility] Dressing to the silver collagen would really has not looked that's good in my opinion. We may want to swit 02/11/18 on evaluation today patient's right lower/lateral lower extremity ulcers appear to be doing very well at this point. Especially the more proximal ulcer has filled in much closer to surface which is good news. Nonetheless both show signs of improvement which is great news. There does not appear to be any evidence of infection which is also good news. In general patient has been doing well tolerating the wraps as well as the [employer]. 02/18/18 on evaluation today patient appears to be doing a little bit worse in regard to the periwound region the wounds themselves do not look much deteriorated to me. With that being said she has several small blisters/pustules noted in the periwound and there was a significant amount of drainage and maceration compared to previous. There has been a time that we had to bring her back for twice a week dressing changes as far as her wrap was concerned it has been a while since we've done that however. With that being said the patient has been having some burning and in general I'm concerned about the possibility of infection. She has previously taken doxycycline with good result. Fortunately there does not appear to be any evidence of overall worsening in regard to the size of the wound and in fact the upper wound actually appears to be showing signs of good epithelialization. 02/25/18 on evaluation today patient actually does appear to be doing a little bit better in regard to swelling compared to last week's evaluation. She has been tolerating the dressing changes without complication which is good news. With that being said wound at this point still appears to be a little erythematous surrounding the wound and this does have me still concerned about the possibility of infection  unfortunately number we had for her was incorrect when I was intention to call her of the week end as well is last Friday I was not able to get through to  alert her that she had a new prescription at the pharmacy. Therefore patient has not been able to start her new scription yet. Patient History Information obtained from Patient. Family History Kidney Disease - Mother, No family history of Cancer, Diabetes, Heart Disease, Hereditary Spherocytosis, Hypertension, Lung Disease, Seizures, Stroke, Thyroid Problems, Tuberculosis. Social History Never smoker, Marital Status - Married, Alcohol Use - Never, Drug Use - No History, Caffeine Use - Daily. Review of Systems (ROS) Constitutional Symptoms (General Health) Denies complaints or symptoms of Fever, Chills. Respiratory The patient has no complaints or symptoms. Cardiovascular Complains or has symptoms of LE edema. Psychiatric The patient has no complaints or symptoms. Objective Constitutional Cappelletti, Mersadies R. ([id number]) Well-nourished and well-hydrated in no acute distress. Vitals Time Taken: 9:08 AM, Height: 74 in, Weight: 505 lbs, BMI: 64.8, Temperature: 97.7 F, Pulse: 96 bpm, Respiratory Rate: 18 breaths/min, Blood Pressure: 146/87 mmHg. Respiratory normal breathing without difficulty. clear to auscultation bilaterally. Cardiovascular regular rate and rhythm with normal S1, S2. 1+ pitting edema of the bilateral lower extremities. Psychiatric this patient is able to make decisions and demonstrates good insight into disease process. Alert and Oriented x 3. pleasant and cooperative. General Notes: At this point patient's wound does appear to be doing better especially the superior wound she still has swelling though not as significant as last week. Both wounds did require sharp debridement today and post debridement they did appear to be doing significantly better is good news. Integumentary (Hair, Skin) Wound #2 status is Open.  Original cause of wound was Gradually Appeared. The wound is located on the Right,Distal,Lateral Lower Leg. The wound measures 2.3cm length x 3.4cm width x 0.2cm depth; 6.142cm^2 area and 1.228cm^3 volume. There is Fat Layer (Subcutaneous Tissue) Exposed exposed. There is no tunneling or undermining noted. There is a large amount of serosanguineous drainage noted. The wound margin is flat and intact. There is large (67-100%) red, hyper - granulation within the wound bed. There is a small (1-33%) amount of necrotic tissue within the wound bed including Adherent Slough. The periwound skin appearance had no abnormalities noted for color. The periwound skin appearance exhibited: Excoriation, Scarring, Maceration. The periwound skin appearance did not exhibit: Callus, Crepitus, Induration, Rash, Dry/Scaly. Periwound temperature was noted as No Abnormality. The periwound has tenderness on palpation. Assessment Active Problems ICD-10 L97.212 - 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d I89.0 - Lymphedema, not elsewhere classified I87.311 - Chronic venous hypertension (idiopathic) with ulcer of right lower extremity E66.01 - Morbid (severe) obesity due to excess calories Procedures Wound #2 Pre-procedure diagnosis of Wound #2 is a Lymphedema located on the Right,Distal,Lateral Lower Leg . There was a Excisional Skin/Subcutaneous Tissue Debridement with a total area of 7.82 sq cm performed by STONE III, HOYT E., PA-C. With the following instrument(s): Curette. to remove Viable and Non-Viable tissue/material Material removed includes Subcutaneous Tissue, and Slough, Biofilm, and Slough after achieving pain control using Other (lidocaine 4%). No specimens were taken. A time out was conducted at 09:50, prior to the start of the procedure. A Minimum amount of bleeding was controlled with Pressure. The procedure was tolerated well with a pain level of 0 throughout and a pain level of 0  following the Wendel, Aideliz R. ([id number]) procedure. Post Debridement Measurements: 2.3cm length x 3.4cm width x 0.3cm depth; 1.843cm^3 volume. Character of Wound/Ulcer Post Debridement is stable. Post procedure Diagnosis Wound #2: Same as Pre-Procedure Plan Wound Cleansing: Wound #2 Right,Distal,Lateral Lower Leg: Clean wound with Normal Saline.  Cleanse wound with mild soap and water Anesthetic (add to Medication List): Wound #2 Right,Distal,Lateral Lower Leg: Topical Lidocaine 4% cream applied to wound bed prior to debridement (In Clinic Only). Skin Barriers/Peri-Wound Care: Wound #2 Right,Distal,Lateral Lower Leg: Barrier cream Moisturizing lotion Primary Wound Dressing: Wound #2 Right,Distal,Lateral Lower Leg: Hydrafera Blue Ready Transfer Secondary Dressing: Wound #2 Right,Distal,Lateral Lower Leg: ABD pad XtraSorb Dressing Change Frequency: Wound #2 Right,Distal,Lateral Lower Leg: Dressing is to be changed Monday and Thursday. Follow-up Appointments: Other: - Monday and Thursday appointment Edema Control: Wound #2 Right,Distal,Lateral Lower Leg: 4-Layer Compression System - Right Lower Extremity - unna to anchor Elevate legs to the level of the heart and pump ankles as often as possible Additional Orders / Instructions: Wound #2 Right,Distal,Lateral Lower Leg: Vitamin A; Vitamin C, Zinc Increase protein intake. Other: - Please watch your salt (sodium) intake At this time I'm gonna suggest that we continue with the Current wound care measures for the next week. Patient is in agreement with plan. Stand at follow-up. Please see above for specific wound care orders. We will see patient for re-evaluation in 1 week(s) here in the clinic. If anything worsens or changes patient will contact our office for additional recommendations. Electronic Signature(s) Signed: 02/26/2018 8:17:58 AM By: Lenda Kelp PA-C Entered By: Lenda Kelp on 02/26/2018 07:47:24 Stokely, Desaree R.  ([id number]) Karilyn Cota, Cristal Deer ([id number]) -------------------------------------------------------------------------------- ROS/PFSH Details Patient Name: Jessica Casey R. Date of Service: 02/25/2018 8:45 AM Medical Record Number: [id number] Patient Account Number: [id number] Date of Birth/Sex: 11-10-1981 (37 y.o. F) Treating RN: Renne Crigler Primary Care Provider: PATIENT, NO Other Clinician: Referring Provider: Referral, Self Treating Provider/Extender: STONE III, HOYT Weeks in Treatment: 24 Information Obtained From Patient Wound History Do you currently have one or more open woundso Yes How many open wounds do you currently haveo 1 Approximately how long have you had your woundso 1 month How have you been treating your wound(s) until nowo aquacel ag Has your wound(s) ever healed and then re-openedo No Have you had any lab work done in the past montho No Have you tested positive for an antibiotic resistant organism (MRSA, VRE)o No Have you tested positive for osteomyelitis (bone infection)o No Have you had any tests for circulation on your legso Yes Who ordered the testo [facility] Where was the test doneo [facility] Constitutional Symptoms (General Health) Complaints and Symptoms: Negative for: Fever; Chills Cardiovascular Complaints and Symptoms: Positive for: LE edema Medical History: Positive for: Hypertension Negative for: Angina; Arrhythmia; Congestive Heart Failure; Coronary Artery Disease; Deep Vein Thrombosis; Hypotension; Myocardial Infarction; Peripheral Arterial Disease; Peripheral Venous Disease; Phlebitis; Vasculitis Hematologic/Lymphatic Medical History: Positive for: Lymphedema Respiratory Complaints and Symptoms: No Complaints or Symptoms Medical History: Negative for: Aspiration; Asthma; Chronic Obstructive Pulmonary Disease (COPD); Pneumothorax; Sleep Apnea; Tuberculosis Psychiatric Complaints and Symptoms: No Complaints or Symptoms Laday, Jordyne R.  ([id number]) Immunizations Pneumococcal Vaccine: Received Pneumococcal Vaccination: No Immunization Notes: up to date Implantable Devices Family and Social History Cancer: No; Diabetes: No; Heart Disease: No; Hereditary Spherocytosis: No; Hypertension: No; Kidney Disease: Yes - Mother; Lung Disease: No; Seizures: No; Stroke: No; Thyroid Problems: No; Tuberculosis: No; Never smoker; Marital Status - Married; Alcohol Use: Never; Drug Use: No History; Caffeine Use: Daily; Financial Concerns: No; Food, Clothing or Shelter Needs: No; Support System Lacking: No; Transportation Concerns: No; Advanced Directives: No; Patient does not want information on Advanced Directives Physician Affirmation I have reviewed and agree with the above information. Electronic Signature(s) Signed: 02/26/2018 8:17:58 AM By: Larina Bras  Eldridge Scot PA-C Signed: 02/26/2018 4:09:12 PM By: Renne Crigler Entered By: Lenda Kelp on 02/26/2018 07:45:44 Hardcastle, Orine RMarland Kitchen ([id number]) -------------------------------------------------------------------------------- SuperBill Details Patient Name: [name] Date of Service: 02/25/2018 Medical Record Number: [id number] Patient Account Number: [id number] Date of Birth/Sex: 1981/02/02 (37 y.o. 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

## 2018-03-02 NOTE — Progress Notes (Addendum)
[name], [name] ([id number]) Visit Report for 02/28/2018 Arrival Information Details Patient Name: [name], [name] Date of Service: 02/28/2018 1:45 PM Medical Record Number: [id number] Patient Account Number: [id number] Date of Birth/Sex: 04-03-1981 (37 y.o. F) Treating RN: Curtis Sitesorthy, Joanna Primary Care Havard Radigan: PATIENT, NO Other Clinician: Referring Annakate Soulier: Referral, Self Treating Boris Engelmann/Extender: Kathreen Cosieroulter, Leah Weeks in Treatment: 24 Visit Information History Since Last Visit Added or deleted any medications: No Patient Arrived: Ambulatory Any new allergies or adverse reactions: No Arrival Time: 13:52 Had [name] fall or experienced change in No Accompanied By: self activities of daily living that may affect Transfer Assistance: None risk of falls: Patient Identification Verified: Yes Signs or symptoms of abuse/neglect since last visito No Secondary Verification Process Completed: Yes Hospitalized since last visit: No Patient Requires Transmission-Based No Implantable device outside of the clinic excluding No Precautions: cellular tissue based products placed in the center Patient Has Alerts: No since last visit: Has Dressing in Place as Prescribed: Yes Has Compression in Place as Prescribed: Yes Pain Present Now: No Electronic Signature(s) Signed: 02/28/2018 2:49:31 PM By: Curtis Sitesorthy, Joanna Entered By: Curtis Sitesorthy, Joanna on 02/28/2018 14:49:31 [name], [name] ([id number]) -------------------------------------------------------------------------------- Compression Therapy Details Patient Name: [name], [name] Date of Service: 02/28/2018 1:45 PM Medical Record Number: [id number] Patient Account Number: [id number] Date of Birth/Sex: 04-03-1981 (37 y.o. F) Treating RN: Curtis Sitesorthy, Joanna Primary Care Alvester Eads: PATIENT, NO Other Clinician: Referring March Joos: Referral, Self Treating Allie Gerhold/Extender: Kathreen Cosieroulter, Leah Weeks in Treatment: 24 Compression Therapy Performed for Wound Assessment:  Wound #2 Right,Distal,Lateral Lower Leg Performed By: Clinician Curtis Sitesorthy, Joanna, RN Compression Type: Four Layer Electronic Signature(s) Signed: 02/28/2018 2:50:22 PM By: Curtis Sitesorthy, Joanna Entered By: Curtis Sitesorthy, Joanna on 02/28/2018 14:50:22 [name], [name] ([id number]) -------------------------------------------------------------------------------- Encounter Discharge Information Details Patient Name: [name], [name] Date of Service: 02/28/2018 1:45 PM Medical Record Number: [id number] Patient Account Number: [id number] Date of Birth/Sex: 04-03-1981 (37 y.o. F) Treating RN: Curtis Sitesorthy, Joanna Primary Care Jamilah Jean: PATIENT, NO Other Clinician: Referring Genifer Lazenby: Referral, Self Treating Freddi Schrager/Extender: Kathreen Cosieroulter, Leah Weeks in Treatment: 24 Encounter Discharge Information Items Discharge Pain Level: 0 Discharge Condition: Stable Ambulatory Status: Ambulatory Discharge Destination: Home Private Transportation: Auto Accompanied By: self Schedule Follow-up Appointment: Yes Medication Reconciliation completed and No provided to Patient/Care Brian Zeitlin: Clinical Summary of Care: Electronic Signature(s) Signed: 02/28/2018 2:51:38 PM By: Curtis Sitesorthy, Joanna Entered By: Curtis Sitesorthy, Joanna on 02/28/2018 14:51:37 [name], [name] ([id number]) -------------------------------------------------------------------------------- Patient/Caregiver Education Details Patient Name: [name], Neira R. Date of Service: 02/28/2018 1:45 PM Medical Record Number: [id number] Patient Account Number: [id number] Date of Birth/Gender: 04-03-1981 (37 y.o. F) Treating RN: Curtis Sitesorthy, Joanna Primary Care Physician: PATIENT, NO Other Clinician: Referring Physician: Referral, Self Treating Physician/Extender: Kathreen Cosieroulter, Leah Weeks in Treatment: 24 Education Assessment Education Provided To: Patient Education Topics Provided Venous: Handouts: Other: continue using your pumps Methods: Explain/Verbal Responses: State content  correctly Electronic Signature(s) Signed: 02/28/2018 3:01:24 PM By: Curtis Sitesorthy, Joanna Entered By: Curtis Sitesorthy, Joanna on 02/28/2018 14:51:22 [name], [name] ([id number]) -------------------------------------------------------------------------------- Wound Assessment Details Patient Name: [name], [name] Date of Service: 02/28/2018 1:45 PM Medical Record Number: [id number] Patient Account Number: [id number] Date of Birth/Sex: 04-03-1981 (37 y.o. F) Treating RN: Curtis Sitesorthy, Joanna Primary Care Woodward Klem: PATIENT, NO Other Clinician: Referring Ensley Blas: Referral, Self Treating Schwanda Zima/Extender: Kathreen Cosieroulter, Leah Weeks in Treatment: 24 Wound Status Wound Number: 2 Primary Etiology: Lymphedema Wound Location: Right Lower Leg - Lateral, Distal Wound Status: Open Wounding Event: Gradually Appeared Comorbid History: Lymphedema, Hypertension Date Acquired: 08/08/2017 Weeks Of Treatment: 24 Clustered Wound: Yes Photos Photo Uploaded By: Curtis Sitesorthy, Joanna on  02/28/2018 14:59:36 Wound Measurements Length: (cm) 2.3 % Redu Width: (cm) 3.4 % Redu Depth: (cm) 0.2 Epithe Clustered Quantity: 2 Tunnel Area: (cm) 6.142 Under Volume: (cm) 1.228 ction in Area: 71.4% ction in Volume: 80.9% lialization: Small (1-33%) ing: No mining: No Wound Description Full Thickness With Exposed Support Classification: Structures Wound Margin: Flat and Intact Exudate Large Amount: Exudate Type: Serosanguineous Exudate Color: red, brown Foul Odor After Cleansing: No Slough/Fibrino Yes Wound Bed Granulation Amount: Large (67-100%) Exposed Structure Granulation Quality: Red, Hyper-granulation Fascia Exposed: No Necrotic Amount: Small (1-33%) Fat Layer (Subcutaneous Tissue) Exposed: Yes Necrotic Quality: Adherent Slough Tendon Exposed: No Muscle Exposed: No Joint Exposed: No Ailes, Laurence R. ([id number]) Bone Exposed: No Periwound Skin Texture Texture Color No Abnormalities Noted: No No Abnormalities Noted:  Yes Callus: No Temperature / Pain Crepitus: No Temperature: No Abnormality Excoriation: Yes Tenderness on Palpation: Yes Induration: No Rash: No Scarring: Yes Moisture No Abnormalities Noted: No Dry / Scaly: No Maceration: Yes Wound Preparation Ulcer Cleansing: Rinsed/Irrigated with Saline, Other: soap and water, Topical Anesthetic Applied: None Treatment Notes Wound #2 (Right, Distal, Lateral Lower Leg) 1. Cleansed with: Cleanse wound with antibacterial soap and water 3. Peri-wound Care: Moisturizing lotion 4. Dressing Applied: Hydrafera Blue Other dressing (specify in notes) 7. Secured with 4-Layer Compression System - Right Lower Extremity Notes xtrasorb, unna to Ecologist) Signed: 02/28/2018 2:50:02 PM By: Curtis Sites Entered By: Curtis Sites on 02/28/2018 14:50:01

## 2018-03-04 ENCOUNTER — Encounter: Payer: Self-pay | Admitting: Physician Assistant

## 2018-03-08 NOTE — Progress Notes (Signed)
CHARLOT, GOUIN ([id number]) Visit Report for 03/04/2018 Chief Complaint Document Details Patient Name: [name], [name] Date of Service: 03/04/2018 8:45 AM Medical Record Number: [id number] Patient Account Number: [id number] Date of Birth/Sex: January 31, 1981 (37 y.o. F) Treating RN: Renne Crigler Primary Care Provider: PATIENT, NO Other Clinician: Referring Provider: Referral, Self Treating Provider/Extender: STONE III, HOYT Weeks in Treatment: 25 Information Obtained from: Patient Chief Complaint She is here in follow up evaluation of right lower extremity ulcers Electronic Signature(s) Signed: 03/04/2018 3:59:23 PM By: Lenda Kelp PA-C Entered By: Lenda Kelp on 03/04/2018 09:05:59 Seelinger, Haydan [name]. ([id number]) -------------------------------------------------------------------------------- Debridement Details Patient Name: [name] Date of Service: 03/04/2018 8:45 AM Medical Record Number: [id number] Patient Account Number: [id number] Date of Birth/Sex: Mar 31, 1981 (37 y.o. F) Treating RN: Renne Crigler Primary Care Provider: PATIENT, NO Other Clinician: Referring Provider: Referral, Self Treating Provider/Extender: STONE III, HOYT Weeks in Treatment: 25 Debridement Performed for Wound #2 Right,Distal,Lateral Lower Leg Assessment: Performed By: Physician STONE III, HOYT E., PA-C Debridement Type: Debridement Pre-procedure Verification/Time Yes - 09:24 Out Taken: Start Time: 09:24 Pain Control: Other : lidocaine 4% Total Area Debrided (L x W): 1.8 (cm) x 4 (cm) = 7.2 (cm) Tissue and other material Viable, Non-Viable, Slough, Subcutaneous, Biofilm, Slough debrided: Level: Skin/Subcutaneous Tissue Debridement Description: Excisional Instrument: Curette Bleeding: Minimum Hemostasis Achieved: Pressure End Time: 09:24 Procedural Pain: 0 Post Procedural Pain: 0 Response to Treatment: Procedure was tolerated well Post Debridement Measurements of Total  Wound Length: (cm) 1.8 Width: (cm) 4 Depth: (cm) 0.2 Volume: (cm) 1.131 Character of Wound/Ulcer Post Debridement: Stable Post Procedure Diagnosis Same as Pre-procedure Electronic Signature(s) Signed: 03/04/2018 3:59:23 PM By: Lenda Kelp PA-C Signed: 03/04/2018 4:13:06 PM By: Renne Crigler Entered By: Renne Crigler on 03/04/2018 09:25:25 Harmening, Carley [name]. ([id number]) -------------------------------------------------------------------------------- HPI Details Patient Name: [name] Date of Service: 03/04/2018 8:45 AM Medical Record Number: [id number] Patient Account Number: [id number] Date of Birth/Sex: Apr 01, 1981 (37 y.o. F) Treating RN: Renne Crigler Primary Care Provider: PATIENT, NO Other Clinician: Referring Provider: Referral, Self Treating Provider/Extender: STONE III, HOYT Weeks in Treatment: 25 History of Present Illness HPI Description: 37 year old patient well known to our [facility] wound care clinic where she has been seen since 2016 for bilateral lower extremity venous insufficiency disease with lymphedema and multiple ulcerations associated with morbid obesity. she had custom-made compression stockings and lymphedema pumps which were used in the past. most recently she was admitted to the hospital between October 11 and 09/02/2017 with sepsis, lower extremity wounds and lymphedema.she was initially treated in the outpatient with Keflex and Bactrim. she was initially treated in the hospital with vancomycin and Zosyn and changed over to Unasyn until her white count improved and her blood cultures were negative for 3 days. After her inpatient management she was discharged home on Augmentin to end on 09/13/2017 with a 14 day course. she has had outpatient vascular duplex scans completed in November 2017 and her right ABI was 1.1 and the left ABI is 1.3. she had normal toe brachial indices bilaterally.she had three-vessel runoff in the right lower extremity  and two-vessel runoff in the left lower extremity. On questioning the patient she does have custom made compression stockings and also has a lymphedema pump but has not been using it appropriately and has not been taking good care of herself. 09/17/2017 -- she returns today with compression stockings on the left side and the right side has had significant amount of drainage and has a very strong odor  09/24/2017 -- the drainage is increased significantly and she has more lymphedema and a very strong odor to her wound. Though she does not have systemic symptoms, or overt infection I believe she will benefit from some doxycycline given empirically. 10/01/2017 -- after starting the doxycycline and changing the dressing twice a week her symptoms and signs have definitely improved overall. 10/08/2017 -- she has completed her course of doxycycline but continues to have a lot of drainage and needs twice a week dressing changes. 11/08/17-she is here in follow-up evaluation for right lower extremity ulcers. She admits to using her lymphedema pumps twice daily, one hour per session. she is voicing no complaints or concerns, no signs of infection [facility] 12/14/17 on evaluation today patient appears to be doing very well in regard to her wounds. She has been tolerating the dressing changes she continues to develop some portly the adherent granular tissue on the surface of the wound with some Slough. Obviously we are trying to get too much better wound bed. With that being said the hyper granulation the Hydrofera Blue Dressing to have helped with which is excellent news. However I think it may be time to try something a little bit different at this point. 01/11/18 on evaluation today patient appears to be doing fairly well in regard to her right lateral lower extremity ulcers. This shows excellent signs of filling in which is great news. There does not appear to be any evidence of infection which  is also good news. She does continue to work as well is good school. She is having no pain. 01/22/18 on evaluation today patient appears to be doing a little bit worse in my opinion in regard to the overall quality of that granulation on her right lower extremity. She was not here last week due to being sick with a stomach virus this may have something to do with the fact that her wound appears to be a little bit worse. With that being said I'm also thinking that after switching [facility] the [facility] to the silver collagen would really has not looked that's good in my opinion. We may want to swit 02/11/18 on evaluation today patient's right lower/lateral lower extremity ulcers appear to be doing very well at this point. Romack, Jalyssa [name]. ([id number]) Especially the more proximal ulcer has filled in much closer to surface which is good news. Nonetheless both show signs of improvement which is great news. There does not appear to be any evidence of infection which is also good news. In general patient has been doing well tolerating the wraps as well as the [employer] Blue Dressing. 02/18/18 on evaluation today patient appears to be doing a little bit worse in regard to the periwound region the wounds themselves do not look much deteriorated to me. With that being said she has several small blisters/pustules noted in the periwound and there was a significant amount of drainage and maceration compared to previous. There has been a time that we had to bring her back for twice a week dressing changes as far as her wrap was concerned it has been a while since we've done that however. With that being said the patient has been having some burning and in general I'm concerned about the possibility of infection. She has previously taken doxycycline with good result. Fortunately there does not appear to be any evidence of overall worsening in regard to the size of the wound and in fact the upper wound  actually  appears to be showing signs of good epithelialization. 02/25/18 on evaluation today patient actually does appear to be doing a little bit better in regard to swelling compared to last week's evaluation. She has been tolerating the dressing changes without complication which is good news. With that being said wound at this point still appears to be a little erythematous surrounding the wound and this does have me still concerned about the possibility of infection unfortunately number we had for her was incorrect when I was intention to call her of the week end as well is last Friday I was not able to get through to alert her that she had a new prescription at the pharmacy. Therefore patient has not been able to start her new scription yet. 03/04/18 on evaluation today patient appears to be doing very well in regard to her right lateral lower surety ulcer. Infection seems to have completely cleared and in that regard things are going great. She has been tolerating the dressing changes without complication. There does not appear to be any evidence of remaining infection she does have two days worth of antibiotics remaining. Electronic Signature(s) Signed: 03/04/2018 3:59:23 PM By: Lenda Kelp PA-C Entered By: Lenda Kelp on 03/04/2018 09:30:49 Cleere, Cristal Deer ([id number]) -------------------------------------------------------------------------------- Physical Exam Details Patient Name: [name] Date of Service: 03/04/2018 8:45 AM Medical Record Number: [id number] Patient Account Number: [id number] Date of Birth/Sex: August 15, 1981 (37 y.o. F) Treating RN: Renne Crigler Primary Care Provider: PATIENT, NO Other Clinician: Referring Provider: Referral, Self Treating Provider/Extender: STONE III, HOYT Weeks in Treatment: 25 Constitutional Well-nourished and well-hydrated in no acute distress. Respiratory normal breathing without difficulty. Cardiovascular 1+ pitting edema of  the bilateral lower extremities. Psychiatric this patient is able to make decisions and demonstrates good insight into disease process. Alert and Oriented x 3. pleasant and cooperative. Notes Patient's wound beds do still show slough covering the surface of the wound although she has much less drainage than she has had in the past weeks. Overall I am very pleased again with the appearance of the wound post debridement it appeared to be even much better currently. She can probably go back to one time a week dressing changes as we were previously performing. Electronic Signature(s) Signed: 03/04/2018 3:59:23 PM By: Lenda Kelp PA-C Entered By: Lenda Kelp on 03/04/2018 09:31:35 [name] ([id number]) -------------------------------------------------------------------------------- Physician Orders Details Patient Name: [name] Date of Service: 03/04/2018 8:45 AM Medical Record Number: [id number] Patient Account Number: [id number] Date of Birth/Sex: 1980-12-29 (37 y.o. F) Treating RN: Renne Crigler Primary Care Provider: PATIENT, NO Other Clinician: Referring Provider: Referral, Self Treating Provider/Extender: STONE III, HOYT Weeks in Treatment: 25 Verbal / Phone Orders: No Diagnosis Coding ICD-10 Coding Code Description [phone] 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d I89.0 Lymphedema, not elsewhere classified I87.311 Chronic venous hypertension (idiopathic) with ulcer of right lower extremity E66.01 Morbid (severe) obesity due to excess calories Wound Cleansing Wound #2 Right,Distal,Lateral Lower Leg o Clean wound with Normal Saline. o Cleanse wound with mild soap and water Anesthetic (add to Medication List) Wound #2 Right,Distal,Lateral Lower Leg o Topical Lidocaine 4% cream applied to wound bed prior to debridement (In Clinic Only). Skin Barriers/Peri-Wound Care Wound #2 Right,Distal,Lateral Lower Leg o Barrier cream o  Moisturizing lotion Primary Wound Dressing Wound #2 Right,Distal,Lateral Lower Leg o Hydrafera Blue Ready Transfer Secondary Dressing Wound #2 Right,Distal,Lateral Lower Leg o ABD pad o XtraSorb Dressing Change Frequency Wound #2 Right,Distal,Lateral Lower Leg   o Change dressing every week Follow-up Appointments o Other: - Monday and Thursday appointment Edema Control Wound #2 Right,Distal,Lateral Lower Leg o 4-Layer Compression System - Right Lower Extremity - unna to anchor o Elevate legs to the level of the heart and pump ankles as often as possible Larmer, Denora [name]. ([id number]) Additional Orders / Instructions Wound #2 Right,Distal,Lateral Lower Leg o Vitamin A; Vitamin C, Zinc o Increase protein intake. o Other: - Please watch your salt (sodium) intake Electronic Signature(s) Signed: 03/04/2018 3:59:23 PM By: Lenda Kelp PA-C Signed: 03/04/2018 4:13:06 PM By: Renne Crigler Entered By: Renne Crigler on 03/04/2018 56:21:30 [name] ([id number]) -------------------------------------------------------------------------------- Problem List Details Patient Name: [name] Date of Service: 03/04/2018 8:45 AM Medical Record Number: [id number] Patient Account Number: [id number] Date of Birth/Sex: 01-11-1981 (37 y.o. F) Treating RN: Renne Crigler Primary Care Provider: PATIENT, NO Other Clinician: Referring Provider: Referral, Self Treating Provider/Extender: Linwood Dibbles, HOYT Weeks in Treatment: 25 Active Problems ICD-10 Impacting Encounter Code Description Active Date Wound Healing Diagnosis L97.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d 09/10/2017 Yes I89.0 Lymphedema, not elsewhere classified 09/10/2017 Yes I87.311 Chronic venous hypertension (idiopathic) with ulcer of right 09/10/2017 Yes lower extremity E66.01 Morbid (severe) obesity due to excess calories 09/10/2017 Yes Inactive Problems Resolved  Problems Electronic Signature(s) Signed: 03/04/2018 3:59:23 PM By: Lenda Kelp PA-C Entered By: Lenda Kelp on 03/04/2018 09:05:53 Kapuscinski, Chavela [name]. ([id number]) -------------------------------------------------------------------------------- Progress Note Details Patient Name: [name] Date of Service: 03/04/2018 8:45 AM Medical Record Number: [id number] Patient Account Number: [id number] Date of Birth/Sex: 07-May-1981 (37 y.o. F) Treating RN: Renne Crigler Primary Care Provider: PATIENT, NO Other Clinician: Referring Provider: Referral, Self Treating Provider/Extender: STONE III, HOYT Weeks in Treatment: 25 Subjective Chief Complaint Information obtained from Patient She is here in follow up evaluation of right lower extremity ulcers History of Present Illness (HPI) 37 year old patient well known to our [facility] wound care clinic where she has been seen since 2016 for bilateral lower extremity venous insufficiency disease with lymphedema and multiple ulcerations associated with morbid obesity. she had custom-made compression stockings and lymphedema pumps which were used in the past. most recently she was admitted to the hospital between October 11 and 09/02/2017 with sepsis, lower extremity wounds and lymphedema.she was initially treated in the outpatient with Keflex and Bactrim. she was initially treated in the hospital with vancomycin and Zosyn and changed over to Unasyn until her white count improved and her blood cultures were negative for 3 days. After her inpatient management she was discharged home on Augmentin to end on 09/13/2017 with a 14 day course. she has had outpatient vascular duplex scans completed in November 2017 and her right ABI was 1.1 and the left ABI is 1.3. she had normal toe brachial indices bilaterally.she had three-vessel runoff in the right lower extremity and two-vessel runoff in the left lower extremity. On questioning the patient she does  have custom made compression stockings and also has a lymphedema pump but has not been using it appropriately and has not been taking good care of herself. 09/17/2017 -- she returns today with compression stockings on the left side and the right side has had significant amount of drainage and has a very strong odor 09/24/2017 -- the drainage is increased significantly and she has more lymphedema and a very strong odor to her wound. Though she does not have systemic symptoms, or overt infection I believe she will benefit from some doxycycline given empirically. 10/01/2017 --  after starting the doxycycline and changing the dressing twice a week her symptoms and signs have definitely improved overall. 10/08/2017 -- she has completed her course of doxycycline but continues to have a lot of drainage and needs twice a week dressing changes. 11/08/17-she is here in follow-up evaluation for right lower extremity ulcers. She admits to using her lymphedema pumps twice daily, one hour per session. she is voicing no complaints or concerns, no signs of infection will change to [facility] 12/14/17 on evaluation today patient appears to be doing very well in regard to her wounds. She has been tolerating the dressing changes she continues to develop some portly the adherent granular tissue on the surface of the wound with some Slough. Obviously we are trying to get too much better wound bed. With that being said the hyper granulation the Hydrofera Blue Dressing to have helped with which is excellent news. However I think it may be time to try something a little bit different at this point. 01/11/18 on evaluation today patient appears to be doing fairly well in regard to her right lateral lower extremity ulcers. This shows excellent signs of filling in which is great news. There does not appear to be any evidence of infection which is also good news. She does continue to work as well is good school. She is having no  pain. 01/22/18 on evaluation today patient appears to be doing a little bit worse in my opinion in regard to the overall quality of that Mcavoy, Maureen [name]. ([id number]) granulation on her right lower extremity. She was not here last week due to being sick with a stomach virus this may have something to do with the fact that her wound appears to be a little bit worse. With that being said I'm also thinking that after switching from the [facility] Dressing to the silver collagen would really has not looked that's good in my opinion. We may want to swit 02/11/18 on evaluation today patient's right lower/lateral lower extremity ulcers appear to be doing very well at this point. Especially the more proximal ulcer has filled in much closer to surface which is good news. Nonetheless both show signs of improvement which is great news. There does not appear to be any evidence of infection which is also good news. In general patient has been doing well tolerating the wraps as well as the [employer]. 02/18/18 on evaluation today patient appears to be doing a little bit worse in regard to the periwound region the wounds themselves do not look much deteriorated to me. With that being said she has several small blisters/pustules noted in the periwound and there was a significant amount of drainage and maceration compared to previous. There has been a time that we had to bring her back for twice a week dressing changes as far as her wrap was concerned it has been a while since we've done that however. With that being said the patient has been having some burning and in general I'm concerned about the possibility of infection. She has previously taken doxycycline with good result. Fortunately there does not appear to be any evidence of overall worsening in regard to the size of the wound and in fact the upper wound actually appears to be showing signs of good epithelialization. 02/25/18 on evaluation today  patient actually does appear to be doing a little bit better in regard to swelling compared to last week's evaluation. She has been tolerating the dressing changes without complication which  is good news. With that being said wound at this point still appears to be a little erythematous surrounding the wound and this does have me still concerned about the possibility of infection unfortunately number we had for her was incorrect when I was intention to call her of the week end as well is last Friday I was not able to get through to alert her that she had a new prescription at the pharmacy. Therefore patient has not been able to start her new scription yet. 03/04/18 on evaluation today patient appears to be doing very well in regard to her right lateral lower surety ulcer. Infection seems to have completely cleared and in that regard things are going great. She has been tolerating the dressing changes without complication. There does not appear to be any evidence of remaining infection she does have two days worth of antibiotics remaining. Patient History Information obtained from Patient. Family History Kidney Disease - Mother, No family history of Cancer, Diabetes, Heart Disease, Hereditary Spherocytosis, Hypertension, Lung Disease, Seizures, Stroke, Thyroid Problems, Tuberculosis. Social History Never smoker, Marital Status - Married, Alcohol Use - Never, Drug Use - No History, Caffeine Use - Daily. Review of Systems (ROS) Constitutional Symptoms (General Health) Denies complaints or symptoms of Fever, Chills. Respiratory The patient has no complaints or symptoms. Cardiovascular Complains or has symptoms of LE edema. Psychiatric The patient has no complaints or symptoms. Parson, Donnajean [name]. ([id number]) Objective Constitutional Well-nourished and well-hydrated in no acute distress. Vitals Time Taken: 9:02 AM, Height: 74 in, Weight: 505 lbs, BMI: 64.8, Temperature: 98.1 F, Pulse: 93 bpm,  Respiratory Rate: 16 breaths/min, Blood Pressure: 138/84 mmHg. Respiratory normal breathing without difficulty. Cardiovascular 1+ pitting edema of the bilateral lower extremities. Psychiatric this patient is able to make decisions and demonstrates good insight into disease process. Alert and Oriented x 3. pleasant and cooperative. General Notes: Patient's wound beds do still show slough covering the surface of the wound although she has much less drainage than she has had in the past weeks. Overall I am very pleased again with the appearance of the wound post debridement it appeared to be even much better currently. She can probably go back to one time a week dressing changes as we were previously performing. Integumentary (Hair, Skin) Wound #2 status is Open. Original cause of wound was Gradually Appeared. The wound is located on the Right,Distal,Lateral Lower Leg. The wound measures 1.8cm length x 4cm width x 0.2cm depth; 5.655cm^2 area and 1.131cm^3 volume. There is Fat Layer (Subcutaneous Tissue) Exposed exposed. There is no tunneling or undermining noted. There is a large amount of serosanguineous drainage noted. The wound margin is flat and intact. There is medium (34-66%) red, hyper - granulation within the wound bed. There is a medium (34-66%) amount of necrotic tissue within the wound bed including Adherent Slough. The periwound skin appearance did not exhibit: Callus, Crepitus, Excoriation, Induration, Rash, Scarring, Dry/Scaly, Maceration, Atrophie Blanche, Cyanosis, Ecchymosis, Hemosiderin Staining, Mottled, Pallor, Rubor, Erythema. Periwound temperature was noted as No Abnormality. The periwound has tenderness on palpation. Assessment Active Problems ICD-10 L97.212 - 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d I89.0 - Lymphedema, not elsewhere classified I87.311 - Chronic venous hypertension (idiopathic) with ulcer of right lower extremity E66.01 - Morbid  (severe) obesity due to excess calories Procedures Wound #2 Pre-procedure diagnosis of Wound #2 is a Lymphedema located on the Right,Distal,Lateral Lower Leg . There was a Whitehair, Talonda [name]. ([id number]) Excisional Skin/Subcutaneous Tissue Debridement with a total  area of 7.2 sq cm performed by STONE III, HOYT E., PA-C. With the following instrument(s): Curette. to remove Viable and Non-Viable tissue/material Material removed includes Subcutaneous Tissue, and Slough, Biofilm, and Slough after achieving pain control using Other (lidocaine 4%). No specimens were taken. A time out was conducted at 09:24, prior to the start of the procedure. A Minimum amount of bleeding was controlled with Pressure. The procedure was tolerated well with a pain level of 0 throughout and a pain level of 0 following the procedure. Post Debridement Measurements: 1.8cm length x 4cm width x 0.2cm depth; 1.131cm^3 volume. Character of Wound/Ulcer Post Debridement is stable. Post procedure Diagnosis Wound #2: Same as Pre-Procedure Plan Wound Cleansing: Wound #2 Right,Distal,Lateral Lower Leg: Clean wound with Normal Saline. Cleanse wound with mild soap and water Anesthetic (add to Medication List): Wound #2 Right,Distal,Lateral Lower Leg: Topical Lidocaine 4% cream applied to wound bed prior to debridement (In Clinic Only). Skin Barriers/Peri-Wound Care: Wound #2 Right,Distal,Lateral Lower Leg: Barrier cream Moisturizing lotion Primary Wound Dressing: Wound #2 Right,Distal,Lateral Lower Leg: Hydrafera Blue Ready Transfer Secondary Dressing: Wound #2 Right,Distal,Lateral Lower Leg: ABD pad XtraSorb Dressing Change Frequency: Wound #2 Right,Distal,Lateral Lower Leg: Change dressing every week Follow-up Appointments: Other: - Monday and Thursday appointment Edema Control: Wound #2 Right,Distal,Lateral Lower Leg: 4-Layer Compression System - Right Lower Extremity - unna to anchor Elevate legs to the level of the  heart and pump ankles as often as possible Additional Orders / Instructions: Wound #2 Right,Distal,Lateral Lower Leg: Vitamin A; Vitamin C, Zinc Increase protein intake. Other: - Please watch your salt (sodium) intake I'm going to suggest that we continue with the Current wound care measures for the time being otherwise and we will see her for reevaluation in one week. Please see above for specific wound care orders. We will see patient for re-evaluation in 1 week(s) here in the clinic. If anything worsens or changes patient will contact our office for additional recommendations. ZAYNEB, BAUCUM ([id number]) Electronic Signature(s) Signed: 03/04/2018 3:59:23 PM By: Lenda Kelp PA-C Entered By: Lenda Kelp on 03/04/2018 09:31:55 Sciortino, Linzey [name]. ([id number]) -------------------------------------------------------------------------------- ROS/PFSH Details Patient Name: [name] Date of Service: 03/04/2018 8:45 AM Medical Record Number: [id number] Patient Account Number: [id number] Date of Birth/Sex: 23-Nov-1980 (37 y.o. F) Treating RN: Renne Crigler Primary Care Provider: PATIENT, NO Other Clinician: Referring Provider: Referral, Self Treating Provider/Extender: STONE III, HOYT Weeks in Treatment: 25 Information Obtained From Patient Wound History Do you currently have one or more open woundso Yes How many open wounds do you currently haveo 1 Approximately how long have you had your woundso 1 month How have you been treating your wound(s) until nowo aquacel ag Has your wound(s) ever healed and then re-openedo No Have you had any lab work done in the past montho No Have you tested positive for an antibiotic resistant organism (MRSA, VRE)o No Have you tested positive for osteomyelitis (bone infection)o No Have you had any tests for circulation on your legso Yes Who ordered the testo [facility] Where was the test doneo [facility] Constitutional Symptoms (General Health) Complaints  and Symptoms: Negative for: Fever; Chills Cardiovascular Complaints and Symptoms: Positive for: LE edema Medical History: Positive for: Hypertension Negative for: Angina; Arrhythmia; Congestive Heart Failure; Coronary Artery Disease; Deep Vein Thrombosis; Hypotension; Myocardial Infarction; Peripheral Arterial Disease; Peripheral Venous Disease; Phlebitis; Vasculitis Hematologic/Lymphatic Medical History: Positive for: Lymphedema Respiratory Complaints and Symptoms: No Complaints or Symptoms Medical History: Negative for: Aspiration; Asthma; Chronic Obstructive Pulmonary Disease (  COPD); Pneumothorax; Sleep Apnea; Tuberculosis Psychiatric Complaints and Symptoms: No Complaints or Symptoms Noda, Fumi [name]. ([id number]) Immunizations Pneumococcal Vaccine: Received Pneumococcal Vaccination: No Immunization Notes: up to date Implantable Devices Family and Social History Cancer: No; Diabetes: No; Heart Disease: No; Hereditary Spherocytosis: No; Hypertension: No; Kidney Disease: Yes - Mother; Lung Disease: No; Seizures: No; Stroke: No; Thyroid Problems: No; Tuberculosis: No; Never smoker; Marital Status - Married; Alcohol Use: Never; Drug Use: No History; Caffeine Use: Daily; Financial Concerns: No; Food, Clothing or Shelter Needs: No; Support System Lacking: No; Transportation Concerns: No; Advanced Directives: No; Patient does not want information on Advanced Directives Physician Affirmation I have reviewed and agree with the above information. Electronic Signature(s) Signed: 03/04/2018 3:59:23 PM By: Lenda Kelp PA-C Signed: 03/04/2018 4:13:06 PM By: Renne Crigler Entered By: Lenda Kelp on 03/04/2018 09:31:09 Zahm, Emunah [name]. ([id number]) -------------------------------------------------------------------------------- SuperBill Details Patient Name: [name] Date of Service: 03/04/2018 Medical Record Number: [id number] Patient Account Number: [id number] Date of  Birth/Sex: [birth year] (37 y.o. 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

## 2018-03-09 NOTE — Progress Notes (Signed)
[name], Mona R. ([id number]) Visit Report for 03/04/2018 Arrival Information Details Patient Name: [name], [name] Date of Service: 03/04/2018 8:45 AM Medical Record Number: [id number] Patient Account Number: [id number] Date of Birth/Sex: [birth year] (37 y.o. F) Treating RN: Curtis Sitesorthy, Joanna Primary Care Wavie Hashimi: PATIENT, NO Other Clinician: Referring Amarrah Meinhart: Referral, Self Treating Kyann Heydt/Extender: STONE III, HOYT Weeks in Treatment: 25 Visit Information History Since Last Visit Added or deleted any medications: No Patient Arrived: Ambulatory Any new allergies or adverse reactions: No Arrival Time: 09:00 Had a fall or experienced change in No Accompanied By: self activities of daily living that may affect Transfer Assistance: None risk of falls: Patient Identification Verified: Yes Signs or symptoms of abuse/neglect since last visito No Secondary Verification Process Completed: Yes Hospitalized since last visit: No Patient Requires Transmission-Based No Implantable device outside of the clinic excluding No Precautions: cellular tissue based products placed in the center Patient Has Alerts: No since last visit: Has Dressing in Place as Prescribed: Yes Has Compression in Place as Prescribed: Yes Pain Present Now: No Electronic Signature(s) Signed: 03/04/2018 4:05:33 PM By: Curtis Sitesorthy, Joanna Entered By: Curtis Sitesorthy, Joanna on 03/04/2018 09:01:00 [name], [name] ([id number]) -------------------------------------------------------------------------------- Encounter Discharge Information Details Patient Name: [name], [name] Date of Service: 03/04/2018 8:45 AM Medical Record Number: [id number] Patient Account Number: [id number] Date of Birth/Sex: [birth year] (37 y.o. F) Treating RN: Phillis HaggisPinkerton, Debi Primary Care Imogean Ciampa: PATIENT, NO Other Clinician: Referring Ivannah Zody: Referral, Self Treating Jaleeah Slight/Extender: STONE III, HOYT Weeks in Treatment: 25 Encounter Discharge Information  Items Discharge Pain Level: 0 Discharge Condition: Stable Ambulatory Status: Ambulatory Discharge Destination: Home Transportation: Private Auto Accompanied By: self Schedule Follow-up Appointment: Yes Medication Reconciliation completed and No provided to Patient/Care Jaana Brodt: Patient Clinical Summary of Care: Declined Electronic Signature(s) Signed: 03/05/2018 3:03:21 PM By: Gwenlyn PerkingMoore, Shelia Entered By: Gwenlyn PerkingMoore, Shelia on 03/04/2018 09:39:37 Deshpande, Rosalva R. ([id number]) -------------------------------------------------------------------------------- Lower Extremity Assessment Details Patient Name: [name], [name] Date of Service: 03/04/2018 8:45 AM Medical Record Number: [id number] Patient Account Number: [id number] Date of Birth/Sex: [birth year] (37 y.o. F) Treating RN: Curtis Sitesorthy, Joanna Primary Care Eutha Cude: PATIENT, NO Other Clinician: Referring Arsal Tappan: Referral, Self Treating Shasha Buchbinder/Extender: STONE III, HOYT Weeks in Treatment: 25 Edema Assessment Assessed: [Left: No] [Right: No] [Left: Edema] [Right: :] Calf Left: Right: Point of Measurement: 35 cm From Medial Instep cm 57.5 cm Ankle Left: Right: Point of Measurement: 9 cm From Medial Instep cm 35.4 cm Vascular Assessment Pulses: Dorsalis Pedis Palpable: [Right:Yes] Posterior Tibial Palpable: [Right:Yes] Extremity colors, hair growth, and conditions: Extremity Color: [Right:Hyperpigmented] Hair Growth on Extremity: [Right:No] Temperature of Extremity: [Right:Warm] Capillary Refill: [Right:< 3 seconds] Electronic Signature(s) Signed: 03/04/2018 4:05:33 PM By: Curtis Sitesorthy, Joanna Entered By: Curtis Sitesorthy, Joanna on 03/04/2018 09:06:43 Muenchow, Najah R. ([id number]) -------------------------------------------------------------------------------- Multi Wound Chart Details Patient Name: [name], [name] Date of Service: 03/04/2018 8:45 AM Medical Record Number: [id number] Patient Account Number: [id number] Date of Birth/Sex:  [birth year] (37 y.o. F) Treating RN: Renne CriglerFlinchum, Cheryl Primary Care Damira Kem: PATIENT, NO Other Clinician: Referring Leiyah Maultsby: Referral, Self Treating Ardys Hataway/Extender: STONE III, HOYT Weeks in Treatment: 25 Vital Signs Height(in): 74 Pulse(bpm): 93 Weight(lbs): 505 Blood Pressure(mmHg): 138/84 Body Mass Index(BMI): 65 Temperature(F): 98.1 Respiratory Rate 16 (breaths/min): Photos: [2:No Photos] [N/A:N/A] Wound Location: [2:Right Lower Leg - Lateral, Distal] [N/A:N/A] Wounding Event: [2:Gradually Appeared] [N/A:N/A] Primary Etiology: [2:Lymphedema] [N/A:N/A] Comorbid History: [2:Lymphedema, Hypertension] [N/A:N/A] Date Acquired: [2:08/08/2017] [N/A:N/A] Weeks of Treatment: [2:25] [N/A:N/A] Wound Status: [2:Open] [N/A:N/A] Clustered Wound: [2:Yes] [N/A:N/A] Clustered Quantity: [2:2] [N/A:N/A] Measurements L x W x D [2:1.8x4x0.2] [N/A:N/A] (cm) Area (  cm) : [2:5.655] [N/A:N/A] Volume (cm) : [2:1.131] [N/A:N/A] % Reduction in Area: [2:73.60%] [N/A:N/A] % Reduction in Volume: [2:82.40%] [N/A:N/A] Classification: [2:Full Thickness With Exposed Support Structures] [N/A:N/A] Exudate Amount: [2:Large] [N/A:N/A] Exudate Type: [2:Serosanguineous] [N/A:N/A] Exudate Color: [2:red, brown] [N/A:N/A] Wound Margin: [2:Flat and Intact] [N/A:N/A] Granulation Amount: [2:Medium (34-66%)] [N/A:N/A] Granulation Quality: [2:Red, Hyper-granulation] [N/A:N/A] Necrotic Amount: [2:Medium (34-66%)] [N/A:N/A] Exposed Structures: [2:Fat Layer (Subcutaneous Tissue) Exposed: Yes Fascia: No Tendon: No Muscle: No Joint: No Bone: No] [N/A:N/A] Epithelialization: [2:Small (1-33%)] [N/A:N/A] Periwound Skin Texture: [2:Excoriation: No Induration: No Callus: No] [N/A:N/A] Crepitus: No Rash: No Scarring: No Periwound Skin Moisture: Maceration: No N/A N/A Dry/Scaly: No Periwound Skin Color: Atrophie Blanche: No N/A N/A Cyanosis: No Ecchymosis: No Erythema: No Hemosiderin Staining: No Mottled:  No Pallor: No Rubor: No Temperature: No Abnormality N/A N/A Tenderness on Palpation: Yes N/A N/A Wound Preparation: Ulcer Cleansing: N/A N/A Rinsed/Irrigated with Saline, Other: soap and water Topical Anesthetic Applied: Other: lidocaine 4% Treatment Notes Electronic Signature(s) Signed: 03/04/2018 4:13:06 PM By: Renne Crigler Entered By: Renne Crigler on 03/04/2018 09:23:43 Rhine, Cristal Deer ([id number]) -------------------------------------------------------------------------------- Multi-Disciplinary Care Plan Details Patient Name: Jessica Casey R. Date of Service: 03/04/2018 8:45 AM Medical Record Number: [id number] Patient Account Number: [id number] Date of Birth/Sex: 1981/01/15 (37 y.o. F) Treating RN: Renne Crigler Primary Care Moni Rothrock: PATIENT, NO Other Clinician: Referring Alea Ryer: Referral, Self Treating Joee Iovine/Extender: STONE III, HOYT Weeks in Treatment: 25 Active Inactive ` Orientation to the Wound Care Program Nursing Diagnoses: Knowledge deficit related to the wound healing center program Goals: Patient/caregiver will verbalize understanding of the Wound Healing Center Program Date Initiated: 09/10/2017 Target Resolution Date: 11/23/2017 Goal Status: Active Interventions: Provide education on orientation to the wound center Notes: ` Venous Leg Ulcer Nursing Diagnoses: Knowledge deficit related to disease process and management Goals: Patient/caregiver will verbalize understanding of disease process and disease management Date Initiated: 09/10/2017 Target Resolution Date: 11/23/2017 Goal Status: Active Interventions: Assess peripheral edema status every visit. Notes: ` Wound/Skin Impairment Nursing Diagnoses: Impaired tissue integrity Goals: Ulcer/skin breakdown will heal within 14 weeks Date Initiated: 09/10/2017 Target Resolution Date: 11/24/2017 Goal Status: Active Interventions: MERLY, HINKSON ([id number]) Assess patient/caregiver  ability to obtain necessary supplies Assess patient/caregiver ability to perform ulcer/skin care regimen upon admission and as needed Assess ulceration(s) every visit Notes: Electronic Signature(s) Signed: 03/04/2018 4:13:06 PM By: Renne Crigler Entered By: Renne Crigler on 03/04/2018 09:23:19 [name], [name] ([id number]) -------------------------------------------------------------------------------- Pain Assessment Details Patient Name: Jessica Casey R. Date of Service: 03/04/2018 8:45 AM Medical Record Number: [id number] Patient Account Number: [id number] Date of Birth/Sex: 1981-01-13 (37 y.o. F) Treating RN: Curtis Sites Primary Care Evellyn Tuff: PATIENT, NO Other Clinician: Referring Schae Cando: Referral, Self Treating Quinzell Malcomb/Extender: STONE III, HOYT Weeks in Treatment: 25 Active Problems Location of Pain Severity and Description of Pain Patient Has Paino No Site Locations Pain Management and Medication Current Pain Management: Electronic Signature(s) Signed: 03/04/2018 4:05:33 PM By: Curtis Sites Entered By: Curtis Sites on 03/04/2018 09:01:10 Jessica Hind ([id number]) -------------------------------------------------------------------------------- Patient/Caregiver Education Details Patient Name: Jessica Casey R. Date of Service: 03/04/2018 8:45 AM Medical Record Number: [id number] Patient Account Number: [id number] Date of Birth/Gender: September 19, 1981 (37 y.o. F) Treating RN: Phillis Haggis Primary Care Physician: PATIENT, NO Other Clinician: Referring Physician: Referral, Self Treating Physician/Extender: Linwood Dibbles, HOYT Weeks in Treatment: 25 Education Assessment Education Provided To: Patient Education Topics Provided Wound/Skin Impairment: Handouts: Caring for Your Ulcer, Other: do not get wrap wet Methods: Demonstration, Explain/Verbal Responses: State content correctly Electronic Signature(s) Signed: 03/04/2018 4:22:27 PM By:  [name] Entered By:  [name] on 03/04/2018 09:39:02 Scheirer, Cristal Deer ([id number]) -------------------------------------------------------------------------------- Wound Assessment Details Patient Name: [name], [name] R. Date of Service: 03/04/2018 8:45 AM Medical Record Number: [id number] Patient Account Number: [id number] Date of Birth/Sex: 1981/02/23 (37 y.o. F) Treating RN: Curtis Sites Primary Care Kaymon Denomme: PATIENT, NO Other Clinician: Referring Digby Groeneveld: Referral, Self Treating Aysa Larivee/Extender: STONE III, HOYT Weeks in Treatment: 25 Wound Status Wound Number: 2 Primary Etiology: Lymphedema Wound Location: Right Lower Leg - Lateral, Distal Wound Status: Open Wounding Event: Gradually Appeared Comorbid History: Lymphedema, Hypertension Date Acquired: 08/08/2017 Weeks Of Treatment: 25 Clustered Wound: Yes Photos Photo Uploaded By: Curtis Sites on 03/04/2018 10:51:48 Wound Measurements Length: (cm) 1.8 % Redu Width: (cm) 4 % Redu Depth: (cm) 0.2 Epithe Clustered Quantity: 2 Tunnel Area: (cm) 5.655 Under Volume: (cm) 1.131 ction in Area: 73.6% ction in Volume: 82.4% lialization: Small (1-33%) ing: No mining: No Wound Description Full Thickness With Exposed Support Classification: Structures Wound Margin: Flat and Intact Exudate Large Amount: Exudate Type: Serosanguineous Exudate Color: red, brown Foul Odor After Cleansing: No Slough/Fibrino Yes Wound Bed Granulation Amount: Medium (34-66%) Exposed Structure Granulation Quality: Red, Hyper-granulation Fascia Exposed: No Necrotic Amount: Medium (34-66%) Fat Layer (Subcutaneous Tissue) Exposed: Yes Necrotic Quality: Adherent Slough Tendon Exposed: No Muscle Exposed: No Joint Exposed: No [name], [name] ([id number]) Bone Exposed: No Periwound Skin Texture Texture Color No Abnormalities Noted: No No Abnormalities Noted: No Callus: No Atrophie Blanche: No Crepitus: No Cyanosis: No Excoriation: No Ecchymosis:  No Induration: No Erythema: No Rash: No Hemosiderin Staining: No Scarring: No Mottled: No Pallor: No Moisture Rubor: No No Abnormalities Noted: No Dry / Scaly: No Temperature / Pain Maceration: No Temperature: No Abnormality Tenderness on Palpation: Yes Wound Preparation Ulcer Cleansing: Rinsed/Irrigated with Saline, Other: soap and water, Topical Anesthetic Applied: Other: lidocaine 4%, Treatment Notes Wound #2 (Right, Distal, Lateral Lower Leg) 1. Cleansed with: Clean wound with Normal Saline Cleanse wound with antibacterial soap and water 2. Anesthetic Topical Lidocaine 4% cream to wound bed prior to debridement 3. Peri-wound Care: Moisturizing lotion 4. Dressing Applied: Hydrafera Blue 5. Secondary Dressing Applied ABD Pad 7. Secured with Tape 4-Layer Compression System - Right Lower Extremity Notes xtrasorb, unna to Ecologist) Signed: 03/04/2018 4:05:33 PM By: Curtis Sites Entered By: Curtis Sites on 03/04/2018 09:08:12 Jessica Hind ([id number]) -------------------------------------------------------------------------------- Vitals Details Patient Name: Jessica Casey R. Date of Service: 03/04/2018 8:45 AM Medical Record Number: [id number] Patient Account Number: [id number] Date of Birth/Sex: 08/18/1981 (37 y.o. F) Treating RN: Curtis Sites Primary Care Ruthy Forry: PATIENT, NO Other Clinician: Referring Eura Mccauslin: Referral, Self Treating Lanasia Porras/Extender: STONE III, HOYT Weeks in Treatment: 25 Vital Signs Time Taken: 09:02 Temperature (F): 98.1 Height (in): 74 Pulse (bpm): 93 Weight (lbs): 505 Respiratory Rate (breaths/min): 16 Body Mass Index (BMI): 64.8 Blood Pressure (mmHg): 138/84 Reference Range: 80 - 120 mg / dl Electronic Signature(s) Signed: 03/04/2018 4:05:33 PM By: Curtis Sites Entered By: Curtis Sites on 03/04/2018 09:05:03

## 2018-03-11 ENCOUNTER — Encounter: Payer: Self-pay | Admitting: Physician Assistant

## 2018-03-16 NOTE — Progress Notes (Signed)
[name], [name] ([id number]) Visit Report for 03/11/2018 Chief Complaint Document Details Patient Name: [name], [name] Date of Service: 03/11/2018 9:00 AM Medical Record Number: [id number] Patient Account Number: [id number] Date of Birth/Sex: [birth year] (37 y.o. F) Treating RN: Renne Crigler Primary Care Provider: PATIENT, NO Other Clinician: Referring Provider: Referral, Self Treating Provider/Extender: STONE III, HOYT Weeks in Treatment: 26 Information Obtained from: Patient Chief Complaint She is here in follow up evaluation of right lower extremity ulcers Electronic Signature(s) Signed: 03/11/2018 7:34:23 PM By: Lenda Kelp PA-C Entered By: Lenda Kelp on 03/11/2018 09:20:47 [name], [name] ([id number]) -------------------------------------------------------------------------------- Debridement Details Patient Name: Jessica Casey [name]. Date of Service: 03/11/2018 9:00 AM Medical Record Number: [id number] Patient Account Number: [id number] Date of Birth/Sex: 03-09-1981 (37 y.o. F) Treating RN: Renne Crigler Primary Care Provider: PATIENT, NO Other Clinician: Referring Provider: Referral, Self Treating Provider/Extender: STONE III, HOYT Weeks in Treatment: 26 Debridement Performed for Wound #2 Right,Distal,Lateral Lower Leg Assessment: Performed By: Physician STONE III, HOYT E., PA-C Debridement Type: Debridement Pre-procedure Verification/Time Yes - 09:45 Out Taken: Start Time: 09:45 Pain Control: Other : lidocaine 4% Total Area Debrided (L x W): 2 (cm) x 2.5 (cm) = 5 (cm) Tissue and other material Viable, Non-Viable, Slough, Subcutaneous, Biofilm, Slough debrided: Level: Skin/Subcutaneous Tissue Debridement Description: Excisional Instrument: Curette Bleeding: Minimum Hemostasis Achieved: Pressure End Time: 09:46 Procedural Pain: 0 Post Procedural Pain: 0 Response to Treatment: Procedure was tolerated well Post Debridement Measurements of Total  Wound Length: (cm) 2 Width: (cm) 2.5 Depth: (cm) 0.2 Volume: (cm) 0.785 Character of Wound/Ulcer Post Debridement: Stable Post Procedure Diagnosis Same as Pre-procedure Electronic Signature(s) Signed: 03/11/2018 4:40:07 PM By: Renne Crigler Signed: 03/11/2018 7:34:23 PM By: Lenda Kelp PA-C Entered By: Renne Crigler on 03/11/2018 09:46:55 [name], [name] ([id number]) -------------------------------------------------------------------------------- HPI Details Patient Name: Jessica Casey [name]. Date of Service: 03/11/2018 9:00 AM Medical Record Number: [id number] Patient Account Number: [id number] Date of Birth/Sex: [birth year] (37 y.o. F) Treating RN: Renne Crigler Primary Care Provider: PATIENT, NO Other Clinician: Referring Provider: Referral, Self Treating Provider/Extender: STONE III, HOYT Weeks in Treatment: 26 History of Present Illness HPI Description: 37 year old patient well known to our [facility] wound care clinic where she has been seen since 2016 for bilateral lower extremity venous insufficiency disease with lymphedema and multiple ulcerations associated with morbid obesity. she had custom-made compression stockings and lymphedema pumps which were used in the past. most recently she was admitted to the hospital between October 11 and 09/02/2017 with sepsis, lower extremity wounds and lymphedema.she was initially treated in the outpatient with Keflex and Bactrim. she was initially treated in the hospital with vancomycin and Zosyn and changed over to Unasyn until her white count improved and her blood cultures were negative for 3 days. After her inpatient management she was discharged home on Augmentin to end on 09/13/2017 with a 14 day course. she has had outpatient vascular duplex scans completed in November 2017 and her right ABI was 1.1 and the left ABI is 1.3. she had normal toe brachial indices bilaterally.she had three-vessel runoff in the right lower extremity  and two-vessel runoff in the left lower extremity. On questioning the patient she does have custom made compression stockings and also has a lymphedema pump but has not been using it appropriately and has not been taking good care of herself. 09/17/2017 -- she returns today with compression stockings on the left side and the right side has had significant amount of drainage and has a very strong odor  09/24/2017 -- the drainage is increased significantly and she has more lymphedema and a very strong odor to her wound. Though she does not have systemic symptoms, or overt infection I believe she will benefit from some doxycycline given empirically. 10/01/2017 -- after starting the doxycycline and changing the dressing twice a week her symptoms and signs have definitely improved overall. 10/08/2017 -- she has completed her course of doxycycline but continues to have a lot of drainage and needs twice a week dressing changes. 11/08/17-she is here in follow-up evaluation for right lower extremity ulcers. She admits to using her lymphedema pumps twice daily, one hour per session. she is voicing no complaints or concerns, no signs of infection [facility]/14/17 on evaluation today patient appears to be doing very well in regard to her wounds. She has been tolerating the dressing changes she continues to develop some portly the adherent granular tissue on the surface of the wound with some Slough. Obviously we are trying to get too much better wound bed. With that being said the hyper granulation the Hydrofera Blue Dressing to have helped with which is excellent news. However I think it may be time to try something a little bit different at this point. 01/11/18 on evaluation today patient appears to be doing fairly well in regard to her right lateral lower extremity ulcers. This shows excellent signs of filling in which is great news. There does not appear to be any evidence of infection which  is also good news. She does continue to work as well is good school. She is having no pain. 01/22/18 on evaluation today patient appears to be doing a little bit worse in my opinion in regard to the overall quality of that granulation on her right lower extremity. She was not here last week due to being sick with a stomach virus this may have something to do with the fact that her wound appears to be a little bit worse. With that being said I'm also thinking that after switching from the [facility] Dressing to the silver collagen would really has not looked that's good in my opinion. We may want to swit 02/11/18 on evaluation today patient's right lower/lateral lower extremity ulcers appear to be doing very well at this point. Romack, Jalyssa [name]. ([id number]) Especially the more proximal ulcer has filled in much closer to surface which is good news. Nonetheless both show signs of improvement which is great news. There does not appear to be any evidence of infection which is also good news. In general patient has been doing well tolerating the wraps as well as the [employer] Blue Dressing. 02/18/18 on evaluation today patient appears to be doing a little bit worse in regard to the periwound region the wounds themselves do not look much deteriorated to me. With that being said she has several small blisters/pustules noted in the periwound and there was a significant amount of drainage and maceration compared to previous. There has been a time that we had to bring her back for twice a week dressing changes as far as her wrap was concerned it has been a while since we've done that however. With that being said the patient has been having some burning and in general I'm concerned about the possibility of infection. She has previously taken doxycycline with good result. Fortunately there does not appear to be any evidence of overall worsening in regard to the size of the wound and in fact the upper wound  actually  appears to be showing signs of good epithelialization. 02/25/18 on evaluation today patient actually does appear to be doing a little bit better in regard to swelling compared to last week's evaluation. She has been tolerating the dressing changes without complication which is good news. With that being said wound at this point still appears to be a little erythematous surrounding the wound and this does have me still concerned about the possibility of infection unfortunately number we had for her was incorrect when I was intention to call her of the week end as well is last Friday I was not able to get through to alert her that she had a new prescription at the pharmacy. Therefore patient has not been able to start her new scription yet. 03/04/18 on evaluation today patient appears to be doing very well in regard to her right lateral lower surety ulcer. Infection seems to have completely cleared and in that regard things are going great. She has been tolerating the dressing changes without complication. There does not appear to be any evidence of remaining infection she does have two days worth of antibiotics remaining. 03/11/18 on evaluation today patient appears to be doing excellent in regard to her right lateral lower extremity ulcers. She has been tolerating the dressing changes without complication and in fact the granulation bed appears to be the best I've seen it during the time that I have been caring for her. Her swelling also seems to be down significantly. In general I am very pleased with the progress she seems to be making at this point. Electronic Signature(s) Signed: 03/11/2018 7:34:23 PM By: Lenda Kelp PA-C Entered By: Lenda Kelp on 03/11/2018 18:48:39 [name], Jessica RMarland Kitchen ([id number]) -------------------------------------------------------------------------------- Physical Exam Details Patient Name: Jessica Casey [name]. Date of Service: 03/11/2018 9:00 AM Medical Record  Number: [id number] Patient Account Number: [id number] Date of Birth/Sex: July 18, 1981 (37 y.o. F) Treating RN: Renne Crigler Primary Care Provider: PATIENT, NO Other Clinician: Referring Provider: Referral, Self Treating Provider/Extender: STONE III, HOYT Weeks in Treatment: 26 Constitutional Well-nourished and well-hydrated in no acute distress. Respiratory normal breathing without difficulty. Psychiatric this patient is able to make decisions and demonstrates good insight into disease process. Alert and Oriented x 3. pleasant and cooperative. Notes Patient's wound shows no evidence of infection at this time. She does have some Slough and biofilm on the surface of the wound which was sharply debrided away today without complication and post debridement the wound bed appear to be doing much better. With that being said, I do believe that we will likely stick with the same dressing the [facility] Dressing seems to be doing excellent. Electronic Signature(s) Signed: 03/11/2018 7:34:23 PM By: Lenda Kelp PA-C Entered By: Lenda Kelp on 03/11/2018 18:49:19 [name] ([id number]) -------------------------------------------------------------------------------- Physician Orders Details Patient Name: Jessica Casey [name]. Date of Service: 03/11/2018 9:00 AM Medical Record Number: [id number] Patient Account Number: [id number] Date of Birth/Sex: [birth year] (37 y.o. F) Treating RN: Renne Crigler Primary Care Provider: PATIENT, NO Other Clinician: Referring Provider: Referral, Self Treating Provider/Extender: STONE III, HOYT Weeks in Treatment: 57 Verbal / Phone Orders: No Diagnosis Coding ICD-10 Coding Code Description [phone] 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d I89.0 Lymphedema, not elsewhere classified I87.311 Chronic venous hypertension (idiopathic) with ulcer of right lower extremity E66.01 Morbid (severe) obesity due to excess calories Wound  Cleansing Wound #2 Right,Distal,Lateral Lower Leg o Clean wound with Normal Saline. o Cleanse wound with mild soap and water Anesthetic (add  to Medication List) Wound #2 Right,Distal,Lateral Lower Leg o Topical Lidocaine 4% cream applied to wound bed prior to debridement (In Clinic Only). Skin Barriers/Peri-Wound Care Wound #2 Right,Distal,Lateral Lower Leg o Barrier cream o Moisturizing lotion Primary Wound Dressing Wound #2 Right,Distal,Lateral Lower Leg o Hydrafera Blue Ready Transfer Secondary Dressing Wound #2 Right,Distal,Lateral Lower Leg o ABD pad Dressing Change Frequency Wound #2 Right,Distal,Lateral Lower Leg o Change dressing every week Follow-up Appointments o Other: - Monday and Thursday appointment Edema Control Wound #2 Right,Distal,Lateral Lower Leg o 4-Layer Compression System - Right Lower Extremity - unna to anchor o Elevate legs to the level of the heart and pump ankles as often as possible [name], [name] ([id number]) Additional Orders / Instructions Wound #2 Right,Distal,Lateral Lower Leg o Vitamin A; Vitamin C, Zinc o Increase protein intake. o Other: - Please watch your salt (sodium) intake Electronic Signature(s) Signed: 03/11/2018 4:40:07 PM By: Renne Crigler Signed: 03/11/2018 7:34:23 PM By: Lenda Kelp PA-C Entered By: Renne Crigler on 03/11/2018 09:49:42 [name], [name] ([id number]) -------------------------------------------------------------------------------- Problem List Details Patient Name: Jessica Casey [name]. Date of Service: 03/11/2018 9:00 AM Medical Record Number: [id number] Patient Account Number: [id number] Date of Birth/Sex: March 20, 1981 (37 y.o. F) Treating RN: Renne Crigler Primary Care Provider: PATIENT, NO Other Clinician: Referring Provider: Referral, Self Treating Provider/Extender: Linwood Dibbles, HOYT Weeks in Treatment: 26 Active Problems ICD-10 Impacting Encounter Code Description Active  Date Wound Healing Diagnosis L97.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d 09/10/2017 Yes I89.0 Lymphedema, not elsewhere classified 09/10/2017 Yes I87.311 Chronic venous hypertension (idiopathic) with ulcer of right 09/10/2017 Yes lower extremity E66.01 Morbid (severe) obesity due to excess calories 09/10/2017 Yes Inactive Problems Resolved Problems Electronic Signature(s) Signed: 03/11/2018 7:34:23 PM By: Lenda Kelp PA-C Entered By: Lenda Kelp on 03/11/2018 09:20:40 [name], [name] ([id number]) -------------------------------------------------------------------------------- Progress Note Details Patient Name: Jessica Casey [name]. Date of Service: 03/11/2018 9:00 AM Medical Record Number: [id number] Patient Account Number: [id number] Date of Birth/Sex: 1980/12/04 (37 y.o. F) Treating RN: Renne Crigler Primary Care Provider: PATIENT, NO Other Clinician: Referring Provider: Referral, Self Treating Provider/Extender: STONE III, HOYT Weeks in Treatment: 26 Subjective Chief Complaint Information obtained from Patient She is here in follow up evaluation of right lower extremity ulcers History of Present Illness (HPI) 38 year old patient well known to our [facility] wound care clinic where she has been seen since 2016 for bilateral lower extremity venous insufficiency disease with lymphedema and multiple ulcerations associated with morbid obesity. she had custom-made compression stockings and lymphedema pumps which were used in the past. most recently she was admitted to the hospital between October 11 and 09/02/2017 with sepsis, lower extremity wounds and lymphedema.she was initially treated in the outpatient with Keflex and Bactrim. she was initially treated in the hospital with vancomycin and Zosyn and changed over to Unasyn until her white count improved and her blood cultures were negative for 3 days. After her inpatient management she was discharged  home on Augmentin to end on 09/13/2017 with a 14 day course. she has had outpatient vascular duplex scans completed in November 2017 and her right ABI was 1.1 and the left ABI is 1.3. she had normal toe brachial indices bilaterally.she had three-vessel runoff in the right lower extremity and two-vessel runoff in the left lower extremity. On questioning the patient she does have custom made compression stockings and also has a lymphedema pump but has not been using it appropriately and has not been taking good care of herself. 09/17/2017 --  she returns today with compression stockings on the left side and the right side has had significant amount of drainage and has a very strong odor 09/24/2017 -- the drainage is increased significantly and she has more lymphedema and a very strong odor to her wound. Though she does not have systemic symptoms, or overt infection I believe she will benefit from some doxycycline given empirically. 10/01/2017 -- after starting the doxycycline and changing the dressing twice a week her symptoms and signs have definitely improved overall. 10/08/2017 -- she has completed her course of doxycycline but continues to have a lot of drainage and needs twice a week dressing changes. 11/08/17-she is here in follow-up evaluation for right lower extremity ulcers. She admits to using her lymphedema pumps twice daily, one hour per session. she is voicing no complaints or concerns, no signs of infection will change to [facility] 12/14/17 on evaluation today patient appears to be doing very well in regard to her wounds. She has been tolerating the dressing changes she continues to develop some portly the adherent granular tissue on the surface of the wound with some Slough. Obviously we are trying to get too much better wound bed. With that being said the hyper granulation the Hydrofera Blue Dressing to have helped with which is excellent news. However I think it may be time to try  something a little bit different at this point. 01/11/18 on evaluation today patient appears to be doing fairly well in regard to her right lateral lower extremity ulcers. This shows excellent signs of filling in which is great news. There does not appear to be any evidence of infection which is also good news. She does continue to work as well is good school. She is having no pain. 01/22/18 on evaluation today patient appears to be doing a little bit worse in my opinion in regard to the overall quality of that [name], [name] ([id number]) granulation on her right lower extremity. She was not here last week due to being sick with a stomach virus this may have something to do with the fact that her wound appears to be a little bit worse. With that being said I'm also thinking that after switching from the [facility] Dressing to the silver collagen would really has not looked that's good in my opinion. We may want to swit 02/11/18 on evaluation today patient's right lower/lateral lower extremity ulcers appear to be doing very well at this point. Especially the more proximal ulcer has filled in much closer to surface which is good news. Nonetheless both show signs of improvement which is great news. There does not appear to be any evidence of infection which is also good news. In general patient has been doing well tolerating the wraps as well as the [employer]. 02/18/18 on evaluation today patient appears to be doing a little bit worse in regard to the periwound region the wounds themselves do not look much deteriorated to me. With that being said she has several small blisters/pustules noted in the periwound and there was a significant amount of drainage and maceration compared to previous. There has been a time that we had to bring her back for twice a week dressing changes as far as her wrap was concerned it has been a while since we've done that however. With that being said the  patient has been having some burning and in general I'm concerned about the possibility of infection. She has previously taken doxycycline with good result. Fortunately  there does not appear to be any evidence of overall worsening in regard to the size of the wound and in fact the upper wound actually appears to be showing signs of good epithelialization. 02/25/18 on evaluation today patient actually does appear to be doing a little bit better in regard to swelling compared to last week's evaluation. She has been tolerating the dressing changes without complication which is good news. With that being said wound at this point still appears to be a little erythematous surrounding the wound and this does have me still concerned about the possibility of infection unfortunately number we had for her was incorrect when I was intention to call her of the week end as well is last Friday I was not able to get through to alert her that she had a new prescription at the pharmacy. Therefore patient has not been able to start her new scription yet. 03/04/18 on evaluation today patient appears to be doing very well in regard to her right lateral lower surety ulcer. Infection seems to have completely cleared and in that regard things are going great. She has been tolerating the dressing changes without complication. There does not appear to be any evidence of remaining infection she does have two days worth of antibiotics remaining. 03/11/18 on evaluation today patient appears to be doing excellent in regard to her right lateral lower extremity ulcers. She has been tolerating the dressing changes without complication and in fact the granulation bed appears to be the best I've seen it during the time that I have been caring for her. Her swelling also seems to be down significantly. In general I am very pleased with the progress she seems to be making at this point. Patient History Information obtained from  Patient. Family History Kidney Disease - Mother, No family history of Cancer, Diabetes, Heart Disease, Hereditary Spherocytosis, Hypertension, Lung Disease, Seizures, Stroke, Thyroid Problems, Tuberculosis. Social History Never smoker, Marital Status - Married, Alcohol Use - Never, Drug Use - No History, Caffeine Use - Daily. Review of Systems (ROS) Constitutional Symptoms (General Health) Denies complaints or symptoms of Fever, Chills. Cardiovascular Complains or has symptoms of LE edema. Psychiatric The patient has no complaints or symptoms. [name], [name] ([id number]) Objective Constitutional Well-nourished and well-hydrated in no acute distress. Vitals Time Taken: 9:19 AM, Height: 74 in, Weight: 505 lbs, BMI: 64.8, Temperature: 97.8 F, Pulse: 85 bpm, Respiratory Rate: 16 breaths/min, Blood Pressure: 134/94 mmHg. Respiratory normal breathing without difficulty. Psychiatric this patient is able to make decisions and demonstrates good insight into disease process. Alert and Oriented x 3. pleasant and cooperative. General Notes: Patient's wound shows no evidence of infection at this time. She does have some Slough and biofilm on the surface of the wound which was sharply debrided away today without complication and post debridement the wound bed appear to be doing much better. With that being said, I do believe that we will likely stick with the same dressing the [facility] Dressing seems to be doing excellent. Integumentary (Hair, Skin) Wound #2 status is Open. Original cause of wound was Gradually Appeared. The wound is located on the Right,Distal,Lateral Lower Leg. The wound measures 2cm length x 2.5cm width x 0.1cm depth; 3.927cm^2 area and 0.393cm^3 volume. There is Fat Layer (Subcutaneous Tissue) Exposed exposed. There is no tunneling or undermining noted. There is a large amount of serosanguineous drainage noted. The wound margin is flat and intact. There is large  (67-100%) red, hyper - granulation within the wound  bed. There is a small (1-33%) amount of necrotic tissue within the wound bed including Adherent Slough. The periwound skin appearance did not exhibit: Callus, Crepitus, Excoriation, Induration, Rash, Scarring, Dry/Scaly, Maceration, Atrophie Blanche, Cyanosis, Ecchymosis, Hemosiderin Staining, Mottled, Pallor, Rubor, Erythema. Periwound temperature was noted as No Abnormality. The periwound has tenderness on palpation. Assessment Active Problems ICD-10 L97.212 - 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d I89.0 - Lymphedema, not elsewhere classified I87.311 - Chronic venous hypertension (idiopathic) with ulcer of right lower extremity E66.01 - Morbid (severe) obesity due to excess calories Procedures Wound #2 Pre-procedure diagnosis of Wound #2 is a Lymphedema located on the Right,Distal,Lateral Lower Leg . There was a Excisional Skin/Subcutaneous Tissue Debridement with a total area of 5 sq cm performed by STONE III, HOYT E., PA-C. [name], [name] ([id number]) With the following instrument(s): Curette. to remove Viable and Non-Viable tissue/material Material removed includes Subcutaneous Tissue, and Slough, Biofilm, and Slough after achieving pain control using Other (lidocaine 4%). No specimens were taken. A time out was conducted at 09:45, prior to the start of the procedure. A Minimum amount of bleeding was controlled with Pressure. The procedure was tolerated well with a pain level of 0 throughout and a pain level of 0 following the procedure. Post Debridement Measurements: 2cm length x 2.5cm width x 0.2cm depth; 0.785cm^3 volume. Character of Wound/Ulcer Post Debridement is stable. Post procedure Diagnosis Wound #2: Same as Pre-Procedure Plan Wound Cleansing: Wound #2 Right,Distal,Lateral Lower Leg: Clean wound with Normal Saline. Cleanse wound with mild soap and water Anesthetic (add to Medication List): Wound  #2 Right,Distal,Lateral Lower Leg: Topical Lidocaine 4% cream applied to wound bed prior to debridement (In Clinic Only). Skin Barriers/Peri-Wound Care: Wound #2 Right,Distal,Lateral Lower Leg: Barrier cream Moisturizing lotion Primary Wound Dressing: Wound #2 Right,Distal,Lateral Lower Leg: Hydrafera Blue Ready Transfer Secondary Dressing: Wound #2 Right,Distal,Lateral Lower Leg: ABD pad Dressing Change Frequency: Wound #2 Right,Distal,Lateral Lower Leg: Change dressing every week Follow-up Appointments: Other: - Monday and Thursday appointment Edema Control: Wound #2 Right,Distal,Lateral Lower Leg: 4-Layer Compression System - Right Lower Extremity - unna to anchor Elevate legs to the level of the heart and pump ankles as often as possible Additional Orders / Instructions: Wound #2 Right,Distal,Lateral Lower Leg: Vitamin A; Vitamin C, Zinc Increase protein intake. Other: - Please watch your salt (sodium) intake We will continue with the Current wound care measures for the next week patient is in agreement with the plan. Hopefully she will continue to make great progress like she has up to this point. Please see above for specific wound care orders. We will see patient for re-evaluation in 1 week(s) here in the clinic. If anything worsens or changes patient will contact our office for additional recommendations. Electronic Signature(s) [name], LAMARCA ([id number]) Signed: 03/11/2018 7:34:23 PM By: Lenda Kelp PA-C Entered By: Lenda Kelp on 03/11/2018 18:49:38 Sydnor, Jaspreet [name]. ([id number]) -------------------------------------------------------------------------------- ROS/PFSH Details Patient Name: Jessica Casey [name]. Date of Service: 03/11/2018 9:00 AM Medical Record Number: [id number] Patient Account Number: [id number] Date of Birth/Sex: May 07, 1981 (37 y.o. F) Treating RN: Renne Crigler Primary Care Provider: PATIENT, NO Other Clinician: Referring Provider: Referral,  Self Treating Provider/Extender: STONE III, HOYT Weeks in Treatment: 26 Information Obtained From Patient Wound History Do you currently have one or more open woundso Yes How many open wounds do you currently haveo 1 Approximately how long have you had your woundso 1 month How have you been treating your wound(s) until nowo aquacel ag Has  your wound(s) ever healed and then re-openedo No Have you had any lab work done in the past montho No Have you tested positive for an antibiotic resistant organism (MRSA, VRE)o No Have you tested positive for osteomyelitis (bone infection)o No Have you had any tests for circulation on your legso Yes Who ordered the testo [facility] Where was the test doneo [facility] Constitutional Symptoms (General Health) Complaints and Symptoms: Negative for: Fever; Chills Cardiovascular Complaints and Symptoms: Positive for: LE edema Medical History: Positive for: Hypertension Negative for: Angina; Arrhythmia; Congestive Heart Failure; Coronary Artery Disease; Deep Vein Thrombosis; Hypotension; Myocardial Infarction; Peripheral Arterial Disease; Peripheral Venous Disease; Phlebitis; Vasculitis Hematologic/Lymphatic Medical History: Positive for: Lymphedema Respiratory Medical History: Negative for: Aspiration; Asthma; Chronic Obstructive Pulmonary Disease (COPD); Pneumothorax; Sleep Apnea; Tuberculosis Psychiatric Complaints and Symptoms: No Complaints or Symptoms Immunizations Pneumococcal Vaccine: JONAH, NESTLE ([id number]) Received Pneumococcal Vaccination: No Immunization Notes: up to date Implantable Devices Family and Social History Cancer: No; Diabetes: No; Heart Disease: No; Hereditary Spherocytosis: No; Hypertension: No; Kidney Disease: Yes - Mother; Lung Disease: No; Seizures: No; Stroke: No; Thyroid Problems: No; Tuberculosis: No; Never smoker; Marital Status - Married; Alcohol Use: Never; Drug Use: No History; Caffeine Use: Daily; Financial  Concerns: No; Food, Clothing or Shelter Needs: No; Support System Lacking: No; Transportation Concerns: No; Advanced Directives: No; Patient does not want information on Advanced Directives Physician Affirmation I have reviewed and agree with the above information. Electronic Signature(s) Signed: 03/11/2018 7:34:23 PM By: Lenda Kelp PA-C Signed: 03/12/2018 4:51:25 PM By: Renne Crigler Entered By: Lenda Kelp on 03/11/2018 18:48:57 Aker, Tyrell [name]. ([id number]) -------------------------------------------------------------------------------- SuperBill Details Patient Name: Jessica Casey [name]. Date of Service: 03/11/2018 Medical Record Number: [id number] Patient Account Number: [id number] Date of Birth/Sex: 25-May-1981 (37 y.o. 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

## 2018-03-18 ENCOUNTER — Encounter: Payer: Self-pay | Admitting: Physician Assistant

## 2018-03-19 NOTE — Progress Notes (Signed)
[name], [name] ([id number]) Visit Report for 03/11/2018 Arrival Information Details Patient Name: [name] Date of Service: 03/11/2018 9:00 AM Medical Record Number: [id number] Patient Account Number: [id number] Date of Birth/Sex: [birth year] (37 y.o. F) Treating RN: Curtis Sites Primary Care Graciana Sessa: PATIENT, NO Other Clinician: Referring Aanika Defoor: Referral, Self Treating Yameli Delamater/Extender: STONE III, HOYT Weeks in Treatment: 26 Visit Information History Since Last Visit Added or deleted any medications: No Patient Arrived: Ambulatory Any new allergies or adverse reactions: No Arrival Time: 09:13 Had a fall or experienced change in No Accompanied By: self activities of daily living that may affect Transfer Assistance: None risk of falls: Patient Identification Verified: Yes Signs or symptoms of abuse/neglect since last visito No Secondary Verification Process Completed: Yes Hospitalized since last visit: No Patient Requires Transmission-Based No Implantable device outside of the clinic excluding No Precautions: cellular tissue based products placed in the center Patient Has Alerts: No since last visit: Has Dressing in Place as Prescribed: Yes Has Compression in Place as Prescribed: Yes Pain Present Now: No Electronic Signature(s) Signed: 03/12/2018 4:18:46 PM By: Curtis Sites Entered By: Curtis Sites on 03/11/2018 09:16:46 [name], [name] ([id number]) -------------------------------------------------------------------------------- Encounter Discharge Information Details Patient Name: Jessica Casey R. Date of Service: 03/11/2018 9:00 AM Medical Record Number: [id number] Patient Account Number: [id number] Date of Birth/Sex: Jun 09, 1981 (37 y.o. F) Treating RN: Renne Crigler Primary Care Belkys Henault: PATIENT, NO Other Clinician: Referring Greysen Devino: Referral, Self Treating Glenda Spelman/Extender: STONE III, HOYT Weeks in Treatment: 26 Encounter Discharge Information  Items Discharge Pain Level: 0 Discharge Condition: Stable Ambulatory Status: Ambulatory Discharge Destination: Home Transportation: Private Auto Accompanied By: self Schedule Follow-up Appointment: Yes Medication Reconciliation completed and No provided to Patient/Care Lio Wehrly: Patient Clinical Summary of Care: Declined Electronic Signature(s) Signed: 03/15/2018 4:29:16 PM By: [name] Entered By: [name] on 03/11/2018 10:06:17 [name], [name] ([id number]) -------------------------------------------------------------------------------- Lower Extremity Assessment Details Patient Name: Jessica Casey R. Date of Service: 03/11/2018 9:00 AM Medical Record Number: [id number] Patient Account Number: [id number] Date of Birth/Sex: 1981-02-27 (37 y.o. F) Treating RN: Curtis Sites Primary Care Fares Ramthun: PATIENT, NO Other Clinician: Referring Lanique Gonzalo: Referral, Self Treating Tanara Turvey/Extender: STONE III, HOYT Weeks in Treatment: 26 Edema Assessment Assessed: [Left: No] [Right: No] [Left: Edema] [Right: :] Calf Left: Right: Point of Measurement: 35 cm From Medial Instep cm 56.3 cm Ankle Left: Right: Point of Measurement: 9 cm From Medial Instep cm 34.6 cm Vascular Assessment Pulses: Dorsalis Pedis Palpable: [Right:Yes] Posterior Tibial Extremity colors, hair growth, and conditions: Extremity Color: [Right:Hyperpigmented] Hair Growth on Extremity: [Right:Yes] Temperature of Extremity: [Right:Warm] Capillary Refill: [Right:< 3 seconds] Toe Nail Assessment Left: Right: Thick: Yes Discolored: Yes Deformed: Yes Improper Length and Hygiene: Yes Electronic Signature(s) Signed: 03/12/2018 4:18:46 PM By: Curtis Sites Entered By: Curtis Sites on 03/11/2018 09:25:15 [name], [name] ([id number]) -------------------------------------------------------------------------------- Multi Wound Chart Details Patient Name: Jessica Casey R. Date of Service: 03/11/2018 9:00  AM Medical Record Number: [id number] Patient Account Number: [id number] Date of Birth/Sex: 05-09-1981 (37 y.o. F) Treating RN: Renne Crigler Primary Care Duy Lemming: PATIENT, NO Other Clinician: Referring Lysander Calixte: Referral, Self Treating Hadriel Northup/Extender: STONE III, HOYT Weeks in Treatment: 26 Vital Signs Height(in): 74 Pulse(bpm): 85 Weight(lbs): 505 Blood Pressure(mmHg): 134/94 Body Mass Index(BMI): 65 Temperature(F): 97.8 Respiratory Rate 16 (breaths/min): Photos: [2:No Photos] [N/A:N/A] Wound Location: [2:Right Lower Leg - Lateral, Distal] [N/A:N/A] Wounding Event: [2:Gradually Appeared] [N/A:N/A] Primary Etiology: [2:Lymphedema] [N/A:N/A] Comorbid History: [2:Lymphedema, Hypertension] [N/A:N/A] Date Acquired: [2:08/08/2017] [N/A:N/A] Weeks of Treatment: [2:26] [N/A:N/A] Wound Status: [2:Open] [N/A:N/A] Clustered Wound: [2:Yes] [N/A:N/A]  Clustered Quantity: [2:2] [N/A:N/A] Measurements L x W x D [2:2x2.5x0.1] [N/A:N/A] (cm) Area (cm) : [2:3.927] [N/A:N/A] Volume (cm) : [2:0.393] [N/A:N/A] % Reduction in Area: [2:81.70%] [N/A:N/A] % Reduction in Volume: [2:93.90%] [N/A:N/A] Classification: [2:Full Thickness With Exposed Support Structures] [N/A:N/A] Exudate Amount: [2:Large] [N/A:N/A] Exudate Type: [2:Serosanguineous] [N/A:N/A] Exudate Color: [2:red, brown] [N/A:N/A] Wound Margin: [2:Flat and Intact] [N/A:N/A] Granulation Amount: [2:Large (67-100%)] [N/A:N/A] Granulation Quality: [2:Red, Hyper-granulation] [N/A:N/A] Necrotic Amount: [2:Small (1-33%)] [N/A:N/A] Exposed Structures: [2:Fat Layer (Subcutaneous Tissue) Exposed: Yes Fascia: No Tendon: No Muscle: No Joint: No Bone: No] [N/A:N/A] Epithelialization: [2:Small (1-33%)] [N/A:N/A] Periwound Skin Texture: [2:Excoriation: No Induration: No Callus: No] [N/A:N/A] Crepitus: No Rash: No Scarring: No Periwound Skin Moisture: Maceration: No N/A N/A Dry/Scaly: No Periwound Skin Color: Atrophie Blanche: No N/A  N/A Cyanosis: No Ecchymosis: No Erythema: No Hemosiderin Staining: No Mottled: No Pallor: No Rubor: No Temperature: No Abnormality N/A N/A Tenderness on Palpation: Yes N/A N/A Wound Preparation: Ulcer Cleansing: N/A N/A Rinsed/Irrigated with Saline, Other: soap and water Topical Anesthetic Applied: Other: lidocaine 4% Treatment Notes Electronic Signature(s) Signed: 03/11/2018 4:40:07 PM By: Renne Crigler Entered By: Renne Crigler on 03/11/2018 09:45:45 [name], [name] Kitchen ([id number]) -------------------------------------------------------------------------------- Multi-Disciplinary Care Plan Details Patient Name: Jessica Casey R. Date of Service: 03/11/2018 9:00 AM Medical Record Number: [id number] Patient Account Number: [id number] Date of Birth/Sex: 1981/09/15 (37 y.o. F) Treating RN: Renne Crigler Primary Care Leviticus Harton: PATIENT, NO Other Clinician: Referring Ashur Glatfelter: Referral, Self Treating Ignacia Gentzler/Extender: STONE III, HOYT Weeks in Treatment: 26 Active Inactive ` Orientation to the Wound Care Program Nursing Diagnoses: Knowledge deficit related to the wound healing center program Goals: Patient/caregiver will verbalize understanding of the Wound Healing Center Program Date Initiated: 09/10/2017 Target Resolution Date: 11/23/2017 Goal Status: Active Interventions: Provide education on orientation to the wound center Notes: ` Venous Leg Ulcer Nursing Diagnoses: Knowledge deficit related to disease process and management Goals: Patient/caregiver will verbalize understanding of disease process and disease management Date Initiated: 09/10/2017 Target Resolution Date: 11/23/2017 Goal Status: Active Interventions: Assess peripheral edema status every visit. Notes: ` Wound/Skin Impairment Nursing Diagnoses: Impaired tissue integrity Goals: Ulcer/skin breakdown will heal within 14 weeks Date Initiated: 09/10/2017 Target Resolution Date: 11/24/2017 Goal Status:  Active Interventions: [name], [name] ([id number]) Assess patient/caregiver ability to obtain necessary supplies Assess patient/caregiver ability to perform ulcer/skin care regimen upon admission and as needed Assess ulceration(s) every visit Notes: Electronic Signature(s) Signed: 03/11/2018 4:40:07 PM By: Renne Crigler Entered By: Renne Crigler on 03/11/2018 09:45:00 [name], [name] ([id number]) -------------------------------------------------------------------------------- Pain Assessment Details Patient Name: Jessica Casey R. Date of Service: 03/11/2018 9:00 AM Medical Record Number: [id number] Patient Account Number: [id number] Date of Birth/Sex: [birth year] (37 y.o. F) Treating RN: Curtis Sites Primary Care Demiana Crumbley: PATIENT, NO Other Clinician: Referring Mathieu Schloemer: Referral, Self Treating Scout Gumbs/Extender: STONE III, HOYT Weeks in Treatment: 26 Active Problems Location of Pain Severity and Description of Pain Patient Has Paino No Site Locations Pain Management and Medication Current Pain Management: Electronic Signature(s) Signed: 03/12/2018 4:18:46 PM By: Curtis Sites Entered By: Curtis Sites on 03/11/2018 09:19:03 [name] ([id number]) -------------------------------------------------------------------------------- Patient/Caregiver Education Details Patient Name: Jessica Casey R. Date of Service: 03/11/2018 9:00 AM Medical Record Number: [id number] Patient Account Number: [id number] Date of Birth/Gender: 1981/04/13 (37 y.o. F) Treating RN: Phillis Haggis Primary Care Physician: PATIENT, NO Other Clinician: Referring Physician: Referral, Self Treating Physician/Extender: Linwood Dibbles, HOYT Weeks in Treatment: 26 Education Assessment Education Provided To: Patient Education Topics Provided Wound/Skin Impairment: Handouts: Caring for Your Ulcer, Other: do not get wrap wet  Methods: Demonstration, Explain/Verbal Responses: State content correctly Electronic  Signature(s) Signed: 03/15/2018 4:29:16 PM By: [name] Entered By: [name] on 03/11/2018 10:06:35 [name] ([id number]) -------------------------------------------------------------------------------- Wound Assessment Details Patient Name: Jessica Casey R. Date of Service: 03/11/2018 9:00 AM Medical Record Number: [id number] Patient Account Number: [id number] Date of Birth/Sex: 1980-11-21 (37 y.o. F) Treating RN: Curtis Sites Primary Care Raquell Richer: PATIENT, NO Other Clinician: Referring Tyana Butzer: Referral, Self Treating Jaylin Benzel/Extender: STONE III, HOYT Weeks in Treatment: 26 Wound Status Wound Number: 2 Primary Etiology: Lymphedema Wound Location: Right Lower Leg - Lateral, Distal Wound Status: Open Wounding Event: Gradually Appeared Comorbid History: Lymphedema, Hypertension Date Acquired: 08/08/2017 Weeks Of Treatment: 26 Clustered Wound: Yes Photos Photo Uploaded By: Curtis Sites on 03/11/2018 12:40:09 Wound Measurements Length: (cm) 2 % Redu Width: (cm) 2.5 % Redu Depth: (cm) 0.1 Epithe Clustered Quantity: 2 Tunnel Area: (cm) 3.927 Under Volume: (cm) 0.393 ction in Area: 81.7% ction in Volume: 93.9% lialization: Small (1-33%) ing: No mining: No Wound Description Full Thickness With Exposed Support Classification: Structures Wound Margin: Flat and Intact Exudate Large Amount: Exudate Type: Serosanguineous Exudate Color: red, brown Foul Odor After Cleansing: No Slough/Fibrino Yes Wound Bed Granulation Amount: Large (67-100%) Exposed Structure Granulation Quality: Red, Hyper-granulation Fascia Exposed: No Necrotic Amount: Small (1-33%) Fat Layer (Subcutaneous Tissue) Exposed: Yes Necrotic Quality: Adherent Slough Tendon Exposed: No Muscle Exposed: No Joint Exposed: No Gales, [name] ([id number]) Bone Exposed: No Periwound Skin Texture Texture Color No Abnormalities Noted: No No Abnormalities Noted: No Callus: No Atrophie  Blanche: No Crepitus: No Cyanosis: No Excoriation: No Ecchymosis: No Induration: No Erythema: No Rash: No Hemosiderin Staining: No Scarring: No Mottled: No Pallor: No Moisture Rubor: No No Abnormalities Noted: No Dry / Scaly: No Temperature / Pain Maceration: No Temperature: No Abnormality Tenderness on Palpation: Yes Wound Preparation Ulcer Cleansing: Rinsed/Irrigated with Saline, Other: soap and water, Topical Anesthetic Applied: Other: lidocaine 4%, Electronic Signature(s) Signed: 03/12/2018 4:18:46 PM By: Curtis Sites Entered By: Curtis Sites on 03/11/2018 09:29:20 [name] ([id number]) -------------------------------------------------------------------------------- Vitals Details Patient Name: Jessica Casey R. Date of Service: 03/11/2018 9:00 AM Medical Record Number: [id number] Patient Account Number: [id number] Date of Birth/Sex: [birth year] (37 y.o. F) Treating RN: Curtis Sites Primary Care Jenisha Faison: PATIENT, NO Other Clinician: Referring Kiyana Vazguez: Referral, Self Treating Elliett Guarisco/Extender: STONE III, HOYT Weeks in Treatment: 26 Vital Signs Time Taken: 09:19 Temperature (F): 97.8 Height (in): 74 Pulse (bpm): 85 Weight (lbs): 505 Respiratory Rate (breaths/min): 16 Body Mass Index (BMI): 64.8 Blood Pressure (mmHg): 134/94 Reference Range: 80 - 120 mg / dl Electronic Signature(s) Signed: 03/12/2018 4:18:46 PM By: Curtis Sites Entered By: Curtis Sites on 03/11/2018 63:87:56

## 2018-03-21 NOTE — Progress Notes (Signed)
LABREA, ECCLESTON ([id number]) Visit Report for 03/18/2018 Chief Complaint Document Details Patient Name: [name], [name] Date of Service: 03/18/2018 9:00 AM Medical Record Number: [id number] Patient Account Number: [id number] Date of Birth/Sex: 31-Aug-1981 (37 y.o. F) Treating RN: Renne Crigler Primary Care Provider: PATIENT, NO Other Clinician: Referring Provider: Referral, Self Treating Provider/Extender: STONE III,  Weeks in Treatment: 27 Information Obtained from: Patient Chief Complaint She is here in follow up evaluation of right lower extremity ulcers Electronic Signature(s) Signed: 03/19/2018 8:17:44 AM By: Lenda Kelp PA-C Entered By: Lenda Kelp on 03/18/2018 09:08:05 Pfund, Catriona [name]. ([id number]) -------------------------------------------------------------------------------- Debridement Details Patient Name: [name] Date of Service: 03/18/2018 9:00 AM Medical Record Number: [id number] Patient Account Number: [id number] Date of Birth/Sex: 07/03/1981 (37 y.o. F) Treating RN: Renne Crigler Primary Care Provider: PATIENT, NO Other Clinician: Referring Provider: Referral, Self Treating Provider/Extender: STONE III,  Weeks in Treatment: 27 Debridement Performed for Wound #2 Right,Distal,Lateral Lower Leg Assessment: Performed By: Physician STONE III,  E., PA-C Debridement Type: Debridement Pre-procedure Verification/Time Yes - 09:28 Out Taken: Start Time: 09:28 Pain Control: Other : lidocaine 4% Total Area Debrided (L x W): 1.6 (cm) x 2.5 (cm) = 4 (cm) Tissue and other material Viable, Non-Viable, Slough, Subcutaneous, Biofilm, Slough debrided: Level: Skin/Subcutaneous Tissue Debridement Description: Excisional Instrument: Curette Bleeding: Minimum Hemostasis Achieved: Pressure End Time: 09:29 Procedural Pain: 0 Post Procedural Pain: 0 Response to Treatment: Procedure was tolerated well Post Debridement Measurements of Total  Wound Length: (cm) 1.6 Width: (cm) 2.5 Depth: (cm) 0.1 Volume: (cm) 0.314 Character of Wound/Ulcer Post Debridement: Stable Post Procedure Diagnosis Same as Pre-procedure Electronic Signature(s) Signed: 03/18/2018 5:23:19 PM By: Renne Crigler Signed: 03/19/2018 8:17:44 AM By: Lenda Kelp PA-C Entered By: Renne Crigler on 03/18/2018 09:30:19 Chinn, Brayla [name]. ([id number]) -------------------------------------------------------------------------------- HPI Details Patient Name: [name] Date of Service: 03/18/2018 9:00 AM Medical Record Number: [id number] Patient Account Number: [id number] Date of Birth/Sex: [birth year] (37 y.o. F) Treating RN: Renne Crigler Primary Care Provider: PATIENT, NO Other Clinician: Referring Provider: Referral, Self Treating Provider/Extender: STONE III,  Weeks in Treatment: 27 History of Present Illness HPI Description: 37 year old patient well known to our [facility] wound care clinic where she has been seen since 2016 for bilateral lower extremity venous insufficiency disease with lymphedema and multiple ulcerations associated with morbid obesity. she had custom-made compression stockings and lymphedema pumps which were used in the past. most recently she was admitted to the hospital between October 11 and 09/02/2017 with sepsis, lower extremity wounds and lymphedema.she was initially treated in the outpatient with Keflex and Bactrim. she was initially treated in the hospital with vancomycin and Zosyn and changed over to Unasyn until her white count improved and her blood cultures were negative for 3 days. After her inpatient management she was discharged home on Augmentin to end on 09/13/2017 with a 14 day course. she has had outpatient vascular duplex scans completed in November 2017 and her right ABI was 1.1 and the left ABI is 1.3. she had normal toe brachial indices bilaterally.she had three-vessel runoff in the right lower  extremity and two-vessel runoff in the left lower extremity. On questioning the patient she does have custom made compression stockings and also has a lymphedema pump but has not been using it appropriately and has not been taking good care of herself. 09/17/2017 -- she returns today with compression stockings on the left side and the right side has had significant amount of drainage and has a very strong odor  09/24/2017 -- the drainage is increased significantly and she has more lymphedema and a very strong odor to her wound. Though she does not have systemic symptoms, or overt infection I believe she will benefit from some doxycycline given empirically. 10/01/2017 -- after starting the doxycycline and changing the dressing twice a week her symptoms and signs have definitely improved overall. 10/08/2017 -- she has completed her course of doxycycline but continues to have a lot of drainage and needs twice a week dressing changes. 11/08/17-she is here in follow-up evaluation for right lower extremity ulcers. She admits to using her lymphedema pumps twice daily, one hour per session. she is voicing no complaints or concerns, no signs of infection will change to [facility] 12/14/17 on evaluation today patient appears to be doing very well in regard to her wounds. She has been tolerating the dressing changes she continues to develop some portly the adherent granular tissue on the surface of the wound with some Slough. Obviously we are trying to get too much better wound bed. With that being said the hyper granulation the Hydrofera Blue Dressing to have helped with which is excellent news. However I think it may be time to try something a little bit different at this point. 01/11/18 on evaluation today patient appears to be doing fairly well in regard to her right lateral lower extremity ulcers. This shows excellent signs of filling in which is great news. There does not appear to be any evidence of  infection which is also good news. She does continue to work as well is good school. She is having no pain. 01/22/18 on evaluation today patient appears to be doing a little bit worse in my opinion in regard to the overall quality of that granulation on her right lower extremity. She was not here last week due to being sick with a stomach virus this may have something to do with the fact that her wound appears to be a little bit worse. With that being said I'm also thinking that after switching from the [facility] Dressing to the silver collagen would really has not looked that's good in my opinion. We may want to swit 02/11/18 on evaluation today patient's right lower/lateral lower extremity ulcers appear to be doing very well at this point. Birdsall, Anesha [name]. ([id number]) Especially the more proximal ulcer has filled in much closer to surface which is good news. Nonetheless both show signs of improvement which is great news. There does not appear to be any evidence of infection which is also good news. In general patient has been doing well tolerating the wraps as well as the [employer]. 02/18/18 on evaluation today patient appears to be doing a little bit worse in regard to the periwound region the wounds themselves do not look much deteriorated to me. With that being said she has several small blisters/pustules noted in the periwound and there was a significant amount of drainage and maceration compared to previous. There has been a time that we had to bring her back for twice a week dressing changes as far as her wrap was concerned it has been a while since we've done that however. With that being said the patient has been having some burning and in general I'm concerned about the possibility of infection. She has previously taken doxycycline with good result. Fortunately there does not appear to be any evidence of overall worsening in regard to the size of the wound and in fact the  upper wound actually  appears to be showing signs of good epithelialization. 03/18/18 on evaluation today patient appears to be doing excellent in regard to her right lateral lower extremity ulcer. She has been tolerating the dressing changes without complication. Fortunately this seems to be making great progress. Overall I see no signs of infection and there is dramatic improvement overall even compared to last week. Electronic Signature(s) Signed: 03/19/2018 8:17:44 AM By: Lenda Kelp PA-C Entered By: Lenda Kelp on 03/18/2018 09:32:08 [name] ([id number]) -------------------------------------------------------------------------------- Physical Exam Details Patient Name: [name] Date of Service: 03/18/2018 9:00 AM Medical Record Number: [id number] Patient Account Number: [id number] Date of Birth/Sex: 08/15/1981 (37 y.o. F) Treating RN: Renne Crigler Primary Care Provider: PATIENT, NO Other Clinician: Referring Provider: Referral, Self Treating Provider/Extender: STONE III,  Weeks in Treatment: 27 Constitutional Obese and well-hydrated in no acute distress. Respiratory normal breathing without difficulty. Cardiovascular 1+ pitting edema of the bilateral lower extremities. Psychiatric this patient is able to make decisions and demonstrates good insight into disease process. Alert and Oriented x 3. pleasant and cooperative. Notes Patient's wound bed did have slough noted in both locations that did require sharp debridement today this was performed without complication and post debridement the wound beds appear to be doing much better. Fortunately she overall seems to be making good progress. Electronic Signature(s) Signed: 03/19/2018 8:17:44 AM By: Lenda Kelp PA-C Entered By: Lenda Kelp on 03/18/2018 09:32:53 [name] ([id number]) -------------------------------------------------------------------------------- Physician Orders  Details Patient Name: [name] Date of Service: 03/18/2018 9:00 AM Medical Record Number: [id number] Patient Account Number: [id number] Date of Birth/Sex: 11-07-1981 (37 y.o. F) Treating RN: Renne Crigler Primary Care Provider: PATIENT, NO Other Clinician: Referring Provider: Referral, Self Treating Provider/Extender: STONE III,  Weeks in Treatment: 66 Verbal / Phone Orders: No Diagnosis Coding ICD-10 Coding Code Description [phone] 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d I89.0 Lymphedema, not elsewhere classified I87.311 Chronic venous hypertension (idiopathic) with ulcer of right lower extremity E66.01 Morbid (severe) obesity due to excess calories Wound Cleansing Wound #2 Right,Distal,Lateral Lower Leg o Clean wound with Normal Saline. o Cleanse wound with mild soap and water Anesthetic (add to Medication List) Wound #2 Right,Distal,Lateral Lower Leg o Topical Lidocaine 4% cream applied to wound bed prior to debridement (In Clinic Only). Skin Barriers/Peri-Wound Care Wound #2 Right,Distal,Lateral Lower Leg o Barrier cream o Moisturizing lotion Primary Wound Dressing Wound #2 Right,Distal,Lateral Lower Leg o Hydrafera Blue Ready Transfer Secondary Dressing Wound #2 Right,Distal,Lateral Lower Leg o ABD pad Dressing Change Frequency Wound #2 Right,Distal,Lateral Lower Leg o Change dressing every week Follow-up Appointments o Other: - Monday and Thursday appointment Edema Control Wound #2 Right,Distal,Lateral Lower Leg o 4-Layer Compression System - Right Lower Extremity - unna to anchor o Elevate legs to the level of the heart and pump ankles as often as possible Vieyra, Amellia [name]. ([id number]) Additional Orders / Instructions Wound #2 Right,Distal,Lateral Lower Leg o Vitamin A; Vitamin C, Zinc o Increase protein intake. o Other: - Please watch your salt (sodium) intake Electronic Signature(s) Signed:  03/18/2018 5:23:19 PM By: Renne Crigler Signed: 03/19/2018 8:17:44 AM By: Lenda Kelp PA-C Entered By: Renne Crigler on 03/18/2018 09:33:34 Raz, Khaylee [name]. ([id number]) -------------------------------------------------------------------------------- Problem List Details Patient Name: [name] Date of Service: 03/18/2018 9:00 AM Medical Record Number: [id number] Patient Account Number: [id number] Date of Birth/Sex: 23-Nov-1980 (37 y.o. F) Treating RN: Renne Crigler Primary Care Provider: PATIENT, NO Other Clinician: Referring Provider: Referral, Self Treating Provider/Extender:  STONE III,  Weeks in Treatment: 27 Active Problems ICD-10 Impacting Encounter Code Description Active Date Wound Healing Diagnosis L97.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d 09/10/2017 Yes I89.0 Lymphedema, not elsewhere classified 09/10/2017 Yes I87.311 Chronic venous hypertension (idiopathic) with ulcer of right 09/10/2017 Yes lower extremity E66.01 Morbid (severe) obesity due to excess calories 09/10/2017 Yes Inactive Problems Resolved Problems Electronic Signature(s) Signed: 03/19/2018 8:17:44 AM By: Lenda Kelp PA-C Entered By: Lenda Kelp on 03/18/2018 09:07:59 Milleson, Jiali [name]. ([id number]) -------------------------------------------------------------------------------- Progress Note Details Patient Name: [name] Date of Service: 03/18/2018 9:00 AM Medical Record Number: [id number] Patient Account Number: [id number] Date of Birth/Sex: [birth year] (37 y.o. F) Treating RN: Renne Crigler Primary Care Provider: PATIENT, NO Other Clinician: Referring Provider: Referral, Self Treating Provider/Extender: STONE III,  Weeks in Treatment: 27 Subjective Chief Complaint Information obtained from Patient She is here in follow up evaluation of right lower extremity ulcers History of Present Illness (HPI) 37 year old patient well known to our  [facility] wound care clinic where she has been seen since 2016 for bilateral lower extremity venous insufficiency disease with lymphedema and multiple ulcerations associated with morbid obesity. she had custom-made compression stockings and lymphedema pumps which were used in the past. most recently she was admitted to the hospital between October 11 and 09/02/2017 with sepsis, lower extremity wounds and lymphedema.she was initially treated in the outpatient with Keflex and Bactrim. she was initially treated in the hospital with vancomycin and Zosyn and changed over to Unasyn until her white count improved and her blood cultures were negative for 3 days. After her inpatient management she was discharged home on Augmentin to end on 09/13/2017 with a 14 day course. she has had outpatient vascular duplex scans completed in November 2017 and her right ABI was 1.1 and the left ABI is 1.3. she had normal toe brachial indices bilaterally.she had three-vessel runoff in the right lower extremity and two-vessel runoff in the left lower extremity. On questioning the patient she does have custom made compression stockings and also has a lymphedema pump but has not been using it appropriately and has not been taking good care of herself. 09/17/2017 -- she returns today with compression stockings on the left side and the right side has had significant amount of drainage and has a very strong odor 09/24/2017 -- the drainage is increased significantly and she has more lymphedema and a very strong odor to her wound. Though she does not have systemic symptoms, or overt infection I believe she will benefit from some doxycycline given empirically. 10/01/2017 -- after starting the doxycycline and changing the dressing twice a week her symptoms and signs have definitely improved overall. 10/08/2017 -- she has completed her course of doxycycline but continues to have a lot of drainage and needs twice a week dressing  changes. 11/08/17-she is here in follow-up evaluation for right lower extremity ulcers. She admits to using her lymphedema pumps twice daily, one hour per session. she is voicing no complaints or concerns, no signs of infection will change to [facility] 12/14/17 on evaluation today patient appears to be doing very well in regard to her wounds. She has been tolerating the dressing changes she continues to develop some portly the adherent granular tissue on the surface of the wound with some Slough. Obviously we are trying to get too much better wound bed. With that being said the hyper granulation the Hydrofera Blue Dressing to have helped with which is excellent news. However I  think it may be time to try something a little bit different at this point. 01/11/18 on evaluation today patient appears to be doing fairly well in regard to her right lateral lower extremity ulcers. This shows excellent signs of filling in which is great news. There does not appear to be any evidence of infection which is also good news. She does continue to work as well is good school. She is having no pain. 01/22/18 on evaluation today patient appears to be doing a little bit worse in my opinion in regard to the overall quality of that Will, Nkenge [name]. ([id number]) granulation on her right lower extremity. She was not here last week due to being sick with a stomach virus this may have something to do with the fact that her wound appears to be a little bit worse. With that being said I'm also thinking that after switching from the [facility] Dressing to the silver collagen would really has not looked that's good in my opinion. We may want to swit 02/11/18 on evaluation today patient's right lower/lateral lower extremity ulcers appear to be doing very well at this point. Especially the more proximal ulcer has filled in much closer to surface which is good news. Nonetheless both show signs of improvement which is great news. There  does not appear to be any evidence of infection which is also good news. In general patient has been doing well tolerating the wraps as well as the [employer]. 02/18/18 on evaluation today patient appears to be doing a little bit worse in regard to the periwound region the wounds themselves do not look much deteriorated to me. With that being said she has several small blisters/pustules noted in the periwound and there was a significant amount of drainage and maceration compared to previous. There has been a time that we had to bring her back for twice a week dressing changes as far as her wrap was concerned it has been a while since we've done that however. With that being said the patient has been having some burning and in general I'm concerned about the possibility of infection. She has previously taken doxycycline with good result. Fortunately there does not appear to be any evidence of overall worsening in regard to the size of the wound and in fact the upper wound actually appears to be showing signs of good epithelialization. 03/18/18 on evaluation today patient appears to be doing excellent in regard to her right lateral lower extremity ulcer. She has been tolerating the dressing changes without complication. Fortunately this seems to be making great progress. Overall I see no signs of infection and there is dramatic improvement overall even compared to last week. Patient History Information obtained from Patient. Family History Kidney Disease - Mother, No family history of Cancer, Diabetes, Heart Disease, Hereditary Spherocytosis, Hypertension, Lung Disease, Seizures, Stroke, Thyroid Problems, Tuberculosis. Social History Never smoker, Marital Status - Married, Alcohol Use - Never, Drug Use - No History, Caffeine Use - Daily. Review of Systems (ROS) Constitutional Symptoms (General Health) Denies complaints or symptoms of Fever, Chills. Respiratory The patient has no  complaints or symptoms. Cardiovascular Complains or has symptoms of LE edema. Psychiatric The patient has no complaints or symptoms. Objective Constitutional Obese and well-hydrated in no acute distress. Vitals Time Taken: 9:09 AM, Height: 74 in, Weight: 505 lbs, BMI: 64.8, Temperature: 97.7 F, Pulse: 91 bpm, Respiratory Kauzlarich, Shantina [name]. ([id number]) Rate: 18 breaths/min, Blood Pressure: 125/78 mmHg. Respiratory normal breathing without difficulty. Cardiovascular  1+ pitting edema of the bilateral lower extremities. Psychiatric this patient is able to make decisions and demonstrates good insight into disease process. Alert and Oriented x 3. pleasant and cooperative. General Notes: Patient's wound bed did have slough noted in both locations that did require sharp debridement today this was performed without complication and post debridement the wound beds appear to be doing much better. Fortunately she overall seems to be making good progress. Integumentary (Hair, Skin) Wound #2 status is Open. Original cause of wound was Gradually Appeared. The wound is located on the Right,Distal,Lateral Lower Leg. The wound measures 1.6cm length x 2.5cm width x 0.1cm depth; 3.142cm^2 area and 0.314cm^3 volume. There is Fat Layer (Subcutaneous Tissue) Exposed exposed. There is no tunneling or undermining noted. There is a large amount of serosanguineous drainage noted. The wound margin is flat and intact. There is large (67-100%) red granulation within the wound bed. There is a small (1-33%) amount of necrotic tissue within the wound bed including Adherent Slough. The periwound skin appearance did not exhibit: Callus, Crepitus, Excoriation, Induration, Rash, Scarring, Dry/Scaly, Maceration, Atrophie Blanche, Cyanosis, Ecchymosis, Hemosiderin Staining, Mottled, Pallor, Rubor, Erythema. Periwound temperature was noted as No Abnormality. The periwound has tenderness on palpation. Assessment Active  Problems ICD-10 L97.212 - 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d I89.0 - Lymphedema, not elsewhere classified I87.311 - Chronic venous hypertension (idiopathic) with ulcer of right lower extremity E66.01 - Morbid (severe) obesity due to excess calories Procedures Wound #2 Pre-procedure diagnosis of Wound #2 is a Lymphedema located on the Right,Distal,Lateral Lower Leg . There was a Excisional Skin/Subcutaneous Tissue Debridement with a total area of 4 sq cm performed by STONE III,  E., PA-C. With the following instrument(s): Curette. to remove Viable and Non-Viable tissue/material Material removed includes Subcutaneous Tissue, and Slough, Biofilm, and Slough after achieving pain control using Other (lidocaine 4%). No specimens were taken. A time out was conducted at 09:28, prior to the start of the procedure. A Minimum amount of bleeding was controlled with Pressure. The procedure was tolerated well with a pain level of 0 throughout and a pain level of 0 following the procedure. Post Debridement Measurements: 1.6cm length x 2.5cm width x 0.1cm depth; 0.314cm^3 volume. Character of Wound/Ulcer Post Debridement is stable. Post procedure Diagnosis Wound #2: Same as Pre-Procedure Piercey, Jaelene [name]. ([id number]) Plan I'm gonna suggest currently that we continue with the Current wound care measures since she seems to be doing so well. Patient is in agreement with this plan. We will see were things stand in one weeks time reevaluation. Please see above for specific wound care orders. We will see patient for re-evaluation in 1 week(s) here in the clinic. If anything worsens or changes patient will contact our office for additional recommendations. Electronic Signature(s) Signed: 03/19/2018 8:17:44 AM By: Lenda Kelp PA-C Entered By: Lenda Kelp on 03/18/2018 09:33:13 [place], Loany RMarland Kitchen  ([id number]) -------------------------------------------------------------------------------- ROS/PFSH Details Patient Name: [name] Date of Service: 03/18/2018 9:00 AM Medical Record Number: [id number] Patient Account Number: [id number] Date of Birth/Sex: November 16, 1981 (37 y.o. F) Treating RN: Renne Crigler Primary Care Provider: PATIENT, NO Other Clinician: Referring Provider: Referral, Self Treating Provider/Extender: STONE III,  Weeks in Treatment: 27 Information Obtained From Patient Wound History Do you currently have one or more open woundso Yes How many open wounds do you currently haveo 1 Approximately how long have you had your woundso 1 month How have you been treating your wound(s) until nowo aquacel ag  Has your wound(s) ever healed and then re-openedo No Have you had any lab work done in the past montho No Have you tested positive for an antibiotic resistant organism (MRSA, VRE)o No Have you tested positive for osteomyelitis (bone infection)o No Have you had any tests for circulation on your legso Yes Who ordered the testo G [facility] Where was the test doneo [facility] Constitutional Symptoms (General Health) Complaints and Symptoms: Negative for: Fever; Chills Cardiovascular Complaints and Symptoms: Positive for: LE edema Medical History: Positive for: Hypertension Negative for: Angina; Arrhythmia; Congestive Heart Failure; Coronary Artery Disease; Deep Vein Thrombosis; Hypotension; Myocardial Infarction; Peripheral Arterial Disease; Peripheral Venous Disease; Phlebitis; Vasculitis Hematologic/Lymphatic Medical History: Positive for: Lymphedema Respiratory Complaints and Symptoms: No Complaints or Symptoms Medical History: Negative for: Aspiration; Asthma; Chronic Obstructive Pulmonary Disease (COPD); Pneumothorax; Sleep Apnea; Tuberculosis Psychiatric Complaints and Symptoms: No Complaints or Symptoms Areola, Zariah [name]. ([id number]) Immunizations Pneumococcal  Vaccine: Received Pneumococcal Vaccination: No Immunization Notes: up to date Implantable Devices Family and Social History Cancer: No; Diabetes: No; Heart Disease: No; Hereditary Spherocytosis: No; Hypertension: No; Kidney Disease: Yes - Mother; Lung Disease: No; Seizures: No; Stroke: No; Thyroid Problems: No; Tuberculosis: No; Never smoker; Marital Status - Married; Alcohol Use: Never; Drug Use: No History; Caffeine Use: Daily; Financial Concerns: No; Food, Clothing or Shelter Needs: No; Support System Lacking: No; Transportation Concerns: No; Advanced Directives: No; Patient does not want information on Advanced Directives Physician Affirmation I have reviewed and agree with the above information. Electronic Signature(s) Signed: 03/18/2018 5:23:19 PM By: Renne Crigler Signed: 03/19/2018 8:17:44 AM By: Lenda Kelp PA-C Entered By: Lenda Kelp on 03/18/2018 09:32:26 Delone, Betrice RMarland Kitchen ([id number]) -------------------------------------------------------------------------------- SuperBill Details Patient Name: [name] Date of Service: 03/18/2018 Medical Record Number: [id number] Patient Account Number: [id number] Date of Birth/Sex: 1980-12-14 (37 y.o. 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

## 2018-03-23 NOTE — Progress Notes (Signed)
Jessica, [name] ([id number]) Visit Report for 03/18/2018 Arrival Information Details Patient Name: [name], [name] Date of Service: 03/18/2018 9:00 AM Medical Record Number: [id number] Patient Account Number: [id number] Date of Birth/Sex: 1981/06/29 (37 y.o. F) Treating RN: Curtis Sites Primary Care Herron Fero: PATIENT, NO Other Clinician: Referring Keevon Henney: Referral, Self Treating Sameen Leas/Extender: STONE III, HOYT Weeks in Treatment: 27 Visit Information History Since Last Visit Added or deleted any medications: No Patient Arrived: Ambulatory Any new allergies or adverse reactions: No Arrival Time: 09:07 Had a fall or experienced change in No Accompanied By: self activities of daily living that may affect Transfer Assistance: None risk of falls: Patient Identification Verified: Yes Signs or symptoms of abuse/neglect since last visito No Secondary Verification Process Completed: Yes Hospitalized since last visit: No Patient Requires Transmission-Based No Implantable device outside of the clinic excluding No Precautions: cellular tissue based products placed in the center Patient Has Alerts: No since last visit: Has Dressing in Place as Prescribed: Yes Has Compression in Place as Prescribed: Yes Pain Present Now: No Electronic Signature(s) Signed: 03/18/2018 4:56:01 PM By: Curtis Sites Entered By: Curtis Sites on 03/18/2018 09:08:18 [name] ([id number]) -------------------------------------------------------------------------------- Encounter Discharge Information Details Patient Name: Jessica Casey R. Date of Service: 03/18/2018 9:00 AM Medical Record Number: [id number] Patient Account Number: [id number] Date of Birth/Sex: 1981-11-04 (37 y.o. F) Treating RN: Phillis Haggis Primary Care Antowan Samford: PATIENT, NO Other Clinician: Referring Ravin Denardo: Referral, Self Treating Khylen Riolo/Extender: STONE III, HOYT Weeks in Treatment: 27 Encounter Discharge Information  Items Discharge Pain Level: 0 Discharge Condition: Stable Ambulatory Status: Ambulatory Discharge Destination: Home Transportation: Private Auto Accompanied By: self Schedule Follow-up Appointment: Yes Medication Reconciliation completed and No provided to Patient/Care Ellery Meroney: Patient Clinical Summary of Care: Declined Electronic Signature(s) Signed: 03/20/2018 8:52:06 AM By: Gwenlyn Perking Previous Signature: 03/18/2018 9:46:08 AM Version By: [name] Entered By: Gwenlyn Perking on 03/18/2018 09:50:53 Mcelvain, Shatoria R. ([id number]) -------------------------------------------------------------------------------- Lower Extremity Assessment Details Patient Name: Jessica Casey R. Date of Service: 03/18/2018 9:00 AM Medical Record Number: [id number] Patient Account Number: [id number] Date of Birth/Sex: 08/22/1981 (37 y.o. F) Treating RN: Curtis Sites Primary Care Kaile Bixler: PATIENT, NO Other Clinician: Referring De Jaworski: Referral, Self Treating Darrick Greenlaw/Extender: STONE III, HOYT Weeks in Treatment: 27 Edema Assessment Assessed: [Left: No] [Right: No] [Left: Edema] [Right: :] Calf Left: Right: Point of Measurement: 35 cm From Medial Instep cm 57.1 cm Ankle Left: Right: Point of Measurement: 9 cm From Medial Instep cm 35 cm Vascular Assessment Pulses: Dorsalis Pedis Palpable: [Right:Yes] Posterior Tibial Extremity colors, hair growth, and conditions: Extremity Color: [Right:Hyperpigmented] Hair Growth on Extremity: [Right:Yes] Temperature of Extremity: [Right:Warm] Capillary Refill: [Right:< 3 seconds] Toe Nail Assessment Left: Right: Thick: Yes Discolored: Yes Deformed: Yes Improper Length and Hygiene: Yes Electronic Signature(s) Signed: 03/18/2018 4:56:01 PM By: Curtis Sites Entered By: Curtis Sites on 03/18/2018 09:16:39 Ventress, Undrea R. ([id number]) -------------------------------------------------------------------------------- Multi Wound Chart  Details Patient Name: Jessica Casey R. Date of Service: 03/18/2018 9:00 AM Medical Record Number: [id number] Patient Account Number: [id number] Date of Birth/Sex: 1981/02/04 (37 y.o. F) Treating RN: Renne Crigler Primary Care Fatmata Legere: PATIENT, NO Other Clinician: Referring Siria Calandro: Referral, Self Treating Kahlil Cowans/Extender: STONE III, HOYT Weeks in Treatment: 27 Vital Signs Height(in): 74 Pulse(bpm): 91 Weight(lbs): 505 Blood Pressure(mmHg): 125/78 Body Mass Index(BMI): 65 Temperature(F): 97.7 Respiratory Rate 18 (breaths/min): Photos: [2:No Photos] [N/A:N/A] Wound Location: [2:Right Lower Leg - Lateral, Distal] [N/A:N/A] Wounding Event: [2:Gradually Appeared] [N/A:N/A] Primary Etiology: [2:Lymphedema] [N/A:N/A] Comorbid History: [2:Lymphedema, Hypertension] [N/A:N/A] Date Acquired: [2:08/08/2017] [N/A:N/A] Weeks of Treatment: [2:27] [  N/A:N/A] Wound Status: [2:Open] [N/A:N/A] Clustered Wound: [2:Yes] [N/A:N/A] Clustered Quantity: [2:2] [N/A:N/A] Measurements L x W x D [2:1.6x2.5x0.1] [N/A:N/A] (cm) Area (cm) : [2:3.142] [N/A:N/A] Volume (cm) : [2:0.314] [N/A:N/A] % Reduction in Area: [2:85.30%] [N/A:N/A] % Reduction in Volume: [2:95.10%] [N/A:N/A] Classification: [2:Full Thickness With Exposed Support Structures] [N/A:N/A] Exudate Amount: [2:Large] [N/A:N/A] Exudate Type: [2:Serosanguineous] [N/A:N/A] Exudate Color: [2:red, brown] [N/A:N/A] Wound Margin: [2:Flat and Intact] [N/A:N/A] Granulation Amount: [2:Large (67-100%)] [N/A:N/A] Granulation Quality: [2:Red] [N/A:N/A] Necrotic Amount: [2:Small (1-33%)] [N/A:N/A] Exposed Structures: [2:Fat Layer (Subcutaneous Tissue) Exposed: Yes Fascia: No Tendon: No Muscle: No Joint: No Bone: No] [N/A:N/A] Epithelialization: [2:Medium (34-66%)] [N/A:N/A] Periwound Skin Texture: [2:Excoriation: No Induration: No Callus: No] [N/A:N/A] Crepitus: No Rash: No Scarring: No Periwound Skin Moisture: Maceration: No N/A  N/A Dry/Scaly: No Periwound Skin Color: Atrophie Blanche: No N/A N/A Cyanosis: No Ecchymosis: No Erythema: No Hemosiderin Staining: No Mottled: No Pallor: No Rubor: No Temperature: No Abnormality N/A N/A Tenderness on Palpation: Yes N/A N/A Wound Preparation: Ulcer Cleansing: N/A N/A Rinsed/Irrigated with Saline, Other: soap and water Topical Anesthetic Applied: Other: lidocaine 4% Treatment Notes Electronic Signature(s) Signed: 03/18/2018 5:23:19 PM By: Renne Crigler Entered By: Renne Crigler on 03/18/2018 09:28:35 [name] ([id number]) -------------------------------------------------------------------------------- Multi-Disciplinary Care Plan Details Patient Name: Jessica Casey R. Date of Service: 03/18/2018 9:00 AM Medical Record Number: [id number] Patient Account Number: [id number] Date of Birth/Sex: Jan 11, 1981 (37 y.o. F) Treating RN: Renne Crigler Primary Care Muntaha Vermette: PATIENT, NO Other Clinician: Referring Tae Vonada: Referral, Self Treating Kenleigh Toback/Extender: STONE III, HOYT Weeks in Treatment: 27 Active Inactive ` Orientation to the Wound Care Program Nursing Diagnoses: Knowledge deficit related to the wound healing center program Goals: Patient/caregiver will verbalize understanding of the Wound Healing Center Program Date Initiated: 09/10/2017 Target Resolution Date: 11/23/2017 Goal Status: Active Interventions: Provide education on orientation to the wound center Notes: ` Venous Leg Ulcer Nursing Diagnoses: Knowledge deficit related to disease process and management Goals: Patient/caregiver will verbalize understanding of disease process and disease management Date Initiated: 09/10/2017 Target Resolution Date: 11/23/2017 Goal Status: Active Interventions: Assess peripheral edema status every visit. Notes: ` Wound/Skin Impairment Nursing Diagnoses: Impaired tissue integrity Goals: Ulcer/skin breakdown will heal within 14 weeks Date  Initiated: 09/10/2017 Target Resolution Date: 11/24/2017 Goal Status: Active Interventions: LEANDA, PADMORE ([id number]) Assess patient/caregiver ability to obtain necessary supplies Assess patient/caregiver ability to perform ulcer/skin care regimen upon admission and as needed Assess ulceration(s) every visit Notes: Electronic Signature(s) Signed: 03/18/2018 5:23:19 PM By: Renne Crigler Entered By: Renne Crigler on 03/18/2018 09:28:23 Theisen, Arryanna R. ([id number]) -------------------------------------------------------------------------------- Pain Assessment Details Patient Name: Jessica Casey R. Date of Service: 03/18/2018 9:00 AM Medical Record Number: [id number] Patient Account Number: [id number] Date of Birth/Sex: 04-08-1981 (37 y.o. F) Treating RN: Curtis Sites Primary Care Watson Robarge: PATIENT, NO Other Clinician: Referring Aldahir Litaker: Referral, Self Treating Baylin Cabal/Extender: STONE III, HOYT Weeks in Treatment: 27 Active Problems Location of Pain Severity and Description of Pain Patient Has Paino No Site Locations Pain Management and Medication Current Pain Management: Electronic Signature(s) Signed: 03/18/2018 4:56:01 PM By: Curtis Sites Entered By: Curtis Sites on 03/18/2018 09:09:35 [name] ([id number]) -------------------------------------------------------------------------------- Patient/Caregiver Education Details Patient Name: Jessica Casey R. Date of Service: 03/18/2018 9:00 AM Medical Record Number: [id number] Patient Account Number: [id number] Date of Birth/Gender: Aug 14, 1981 (37 y.o. F) Treating RN: Phillis Haggis Primary Care Physician: PATIENT, NO Other Clinician: Referring Physician: Referral, Self Treating Physician/Extender: Linwood Dibbles, HOYT Weeks in Treatment: 55 Education Assessment Education Provided To: Patient Education Topics Provided Wound/Skin Impairment: Handouts: Caring for  Your Ulcer, Other: do not get wrap wet Methods:  Demonstration, Explain/Verbal Responses: State content correctly Electronic Signature(s) Signed: 03/20/2018 4:27:21 PM By: [name] Entered By: [name] on 03/18/2018 09:46:27 Hinderliter, Vinette R. ([id number]) -------------------------------------------------------------------------------- Wound Assessment Details Patient Name: Jessica Casey R. Date of Service: 03/18/2018 9:00 AM Medical Record Number: [id number] Patient Account Number: [id number] Date of Birth/Sex: [birth year] (37 y.o. F) Treating RN: Curtis Sites Primary Care Jaceion Aday: PATIENT, NO Other Clinician: Referring Makynzee Tigges: Referral, Self Treating Ilhan Madan/Extender: STONE III, HOYT Weeks in Treatment: 27 Wound Status Wound Number: 2 Primary Etiology: Lymphedema Wound Location: Right Lower Leg - Lateral, Distal Wound Status: Open Wounding Event: Gradually Appeared Comorbid History: Lymphedema, Hypertension Date Acquired: 08/08/2017 Weeks Of Treatment: 27 Clustered Wound: Yes Photos Photo Uploaded By: Curtis Sites on 03/18/2018 10:49:38 Wound Measurements Length: (cm) 1.6 % Redu Width: (cm) 2.5 % Redu Depth: (cm) 0.1 Epithe Clustered Quantity: 2 Tunnel Area: (cm) 3.142 Under Volume: (cm) 0.314 ction in Area: 85.3% ction in Volume: 95.1% lialization: Medium (34-66%) ing: No mining: No Wound Description Full Thickness With Exposed Support Classification: Structures Wound Margin: Flat and Intact Exudate Large Amount: Exudate Type: Serosanguineous Exudate Color: red, brown Foul Odor After Cleansing: No Slough/Fibrino Yes Wound Bed Granulation Amount: Large (67-100%) Exposed Structure Granulation Quality: Red Fascia Exposed: No Necrotic Amount: Small (1-33%) Fat Layer (Subcutaneous Tissue) Exposed: Yes Necrotic Quality: Adherent Slough Tendon Exposed: No Muscle Exposed: No Joint Exposed: No Kauffmann, Crystle R. ([id number]) Bone Exposed: No Periwound Skin Texture Texture Color No  Abnormalities Noted: No No Abnormalities Noted: No Callus: No Atrophie Blanche: No Crepitus: No Cyanosis: No Excoriation: No Ecchymosis: No Induration: No Erythema: No Rash: No Hemosiderin Staining: No Scarring: No Mottled: No Pallor: No Moisture Rubor: No No Abnormalities Noted: No Dry / Scaly: No Temperature / Pain Maceration: No Temperature: No Abnormality Tenderness on Palpation: Yes Wound Preparation Ulcer Cleansing: Rinsed/Irrigated with Saline, Other: soap and water, Topical Anesthetic Applied: Other: lidocaine 4%, Treatment Notes Wound #2 (Right, Distal, Lateral Lower Leg) 1. Cleansed with: Clean wound with Normal Saline Cleanse wound with antibacterial soap and water 2. Anesthetic Topical Lidocaine 4% cream to wound bed prior to debridement 3. Peri-wound Care: Moisturizing lotion 4. Dressing Applied: Hydrafera Blue 5. Secondary Dressing Applied ABD Pad 7. Secured with Tape 4-Layer Compression System - Right Lower Extremity Notes unna to anchor Electronic Signature(s) Signed: 03/18/2018 4:56:01 PM By: Curtis Sites Entered By: Curtis Sites on 03/18/2018 09:20:45 Curtiss, Cristal Deer ([id number]) -------------------------------------------------------------------------------- Vitals Details Patient Name: Jessica Casey R. Date of Service: 03/18/2018 9:00 AM Medical Record Number: [id number] Patient Account Number: [id number] Date of Birth/Sex: 03/25/1981 (37 y.o. F) Treating RN: Curtis Sites Primary Care Kwanza Cancelliere: PATIENT, NO Other Clinician: Referring Brent Taillon: Referral, Self Treating Latajah Thuman/Extender: STONE III, HOYT Weeks in Treatment: 27 Vital Signs Time Taken: 09:09 Temperature (F): 97.7 Height (in): 74 Pulse (bpm): 91 Weight (lbs): 505 Respiratory Rate (breaths/min): 18 Body Mass Index (BMI): 64.8 Blood Pressure (mmHg): 125/78 Reference Range: 80 - 120 mg / dl Electronic Signature(s) Signed: 03/18/2018 4:56:01 PM By: Curtis Sites Entered By: Curtis Sites on 03/18/2018 09:10:18

## 2018-03-25 ENCOUNTER — Encounter: Payer: Self-pay | Attending: Physician Assistant

## 2018-03-25 DIAGNOSIS — I890 Lymphedema, not elsewhere classified: Secondary | ICD-10-CM | POA: Insufficient documentation

## 2018-03-25 DIAGNOSIS — I87311 Chronic venous hypertension (idiopathic) with ulcer of right lower extremity: Secondary | ICD-10-CM | POA: Insufficient documentation

## 2018-03-25 DIAGNOSIS — L97221 Non-pressure chronic ulcer of left calf limited to breakdown of skin: Secondary | ICD-10-CM | POA: Insufficient documentation

## 2018-03-25 DIAGNOSIS — L97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d: Secondary | ICD-10-CM | POA: Insufficient documentation

## 2018-03-25 DIAGNOSIS — Z6841 Body Mass Index (BMI) 40.0 and over, adult: Secondary | ICD-10-CM | POA: Insufficient documentation

## 2018-03-26 NOTE — Progress Notes (Signed)
Hanf, Gerianne R. ([id number]) Visit Report for 03/25/2018 Arrival Information Details PatienMARKEESHA, CHAR, EPPARD. Date of Service: 03/25/2018 9:00 AM Medical Record Number: [id number] Patient Account Number: [id number] Date of Birth/Sex: [birth year] (37 y.o. F) Treating RN: Huel Coventry Primary Care Emma-Lee Oddo: PATIENT, NO Other Clinician: Referring Devone Bonilla: Referral, Self Treating Shaheed Schmuck/Extender: STONE III, HOYT Weeks in Treatment: 28 Visit Information History Since Last Visit Added or deleted any medications: No Patient Arrived: Ambulatory Any new allergies or adverse reactions: No Arrival Time: 09:02 Had a fall or experienced change in No Accompanied By: self activities of daily living that may affect Transfer Assistance: None risk of falls: Patient Identification Verified: Yes Signs or symptoms of abuse/neglect since last visito No Secondary Verification Process Completed: Yes Hospitalized since last visit: No Patient Requires Transmission-Based No Implantable device outside of the clinic excluding No Precautions: cellular tissue based products placed in the center Patient Has Alerts: No since last visit: Has Dressing in Place as Prescribed: Yes Has Compression in Place as Prescribed: Yes Pain Present Now: No Electronic Signature(s) Signed: 03/25/2018 10:00:00 AM By: Elliot Gurney, BSN, RN, CWS, Kim RN, BSN Entered By: Elliot Gurney, BSN, RN, CWS, Kim on 03/25/2018 09:02:59 [name] ([id number]) -------------------------------------------------------------------------------- Encounter Discharge Information Details Patient Name: [name] Date of Service: 03/25/2018 9:00 AM Medical Record Number: [id number] Patient Account Number: [id number] Date of Birth/Sex: 1981-10-28 (37 y.o. F) Treating RN: Huel Coventry Primary Care Brit Wernette: PATIENT, NO Other Clinician: Referring Brendi Mccarroll: Referral, Self Treating Buna Cuppett/Extender: Linwood Dibbles, HOYT Weeks in Treatment: 28 Encounter Discharge  Information Items Discharge Condition: Stable Ambulatory Status: Ambulatory Discharge Destination: Home Transportation: Private Auto Accompanied By: self Schedule Follow-up Appointment: Yes Clinical Summary of Care: Electronic Signature(s) Signed: 03/25/2018 10:03:55 AM By: Elliot Gurney, BSN, RN, CWS, Kim RN, BSN Entered By: Elliot Gurney, BSN, RN, CWS, Kim on 03/25/2018 10:03:55 [name] ([id number]) -------------------------------------------------------------------------------- Patient/Caregiver Education Details Patient Name: [name] Date of Service: 03/25/2018 9:00 AM Medical Record Number: [id number] Patient Account Number: [id number] Date of Birth/Gender: 18-Jun-1981 (37 y.o. F) Treating RN: Huel Coventry Primary Care Physician: PATIENT, NO Other Clinician: Referring Physician: Referral, Self Treating Physician/Extender: Linwood Dibbles, HOYT Weeks in Treatment: 28 Education Assessment Education Provided To: Patient Education Topics Provided Venous: Handouts: Controlling Swelling with Multilayered Compression Wraps, Other: do not get wraps wet Methods: Demonstration, Explain/Verbal Responses: State content correctly Electronic Signature(s) Signed: 03/25/2018 10:30:21 AM By: Elliot Gurney, BSN, RN, CWS, Kim RN, BSN Entered By: Elliot Gurney, BSN, RN, CWS, Kim on 03/25/2018 10:03:35 [name] ([id number]) -------------------------------------------------------------------------------- Wound Assessment Details Patient Name: [name] Date of Service: 03/25/2018 9:00 AM Medical Record Number: [id number] Patient Account Number: [id number] Date of Birth/Sex: 1981/03/09 (36 y.o. F) Treating RN: Huel Coventry Primary Care Raneen Jaffer: PATIENT, NO Other Clinician: Referring Hopelynn Gartland: Referral, Self Treating Laylah Riga/Extender: STONE III, HOYT Weeks in Treatment: 28 Wound Status Wound Number: 2 Primary Etiology: Lymphedema Wound Location: Right Lower Leg - Lateral, Distal Wound Status: Open Wounding  Event: Gradually Appeared Comorbid History: Lymphedema, Hypertension Date Acquired: 08/08/2017 Weeks Of Treatment: 28 Clustered Wound: Yes Photos Photo Uploaded By: Elliot Gurney, BSN, RN, CWS, Kim on 03/25/2018 11:39:57 Wound Measurements Length: (cm) 1.5 % Reduct Width: (cm) 2.5 % Reduct Depth: (cm) 0.1 Epitheli Clustered Quantity: 2 Tunnelin Area: (cm) 2.945 Undermi Volume: (cm) 0.295 ion in Area: 86.3% ion in Volume: 95.4% alization: Medium (34-66%) g: No ning: No Wound Description Full Thickness With Exposed Support Classification: Structures Wound Margin: Flat and Intact Exudate Small Amount: Exudate Type: Serosanguineous Exudate Color: red, brown  Foul Odor After Cleansing: No Slough/Fibrino No Wound Bed Granulation Amount: Large (67-100%) Exposed Structure Granulation Quality: Red Fascia Exposed: No Necrotic Amount: Small (1-33%) Fat Layer (Subcutaneous Tissue) Exposed: Yes Necrotic Quality: Eschar, Adherent Slough Tendon Exposed: No Muscle Exposed: No Joint Exposed: No Burkett, Kilea R. ([id number]) Bone Exposed: No Periwound Skin Texture Texture Color No Abnormalities Noted: No No Abnormalities Noted: No Callus: No Atrophie Blanche: No Crepitus: No Cyanosis: No Excoriation: No Ecchymosis: No Induration: No Erythema: No Rash: No Hemosiderin Staining: No Scarring: No Mottled: No Pallor: No Moisture Rubor: No No Abnormalities Noted: No Dry / Scaly: Yes Temperature / Pain Maceration: No Temperature: No Abnormality Tenderness on Palpation: Yes Wound Preparation Ulcer Cleansing: Rinsed/Irrigated with Saline, Other: soap and water, Treatment Notes Wound #2 (Right, Distal, Lateral Lower Leg) 1. Cleansed with: Cleanse wound with antibacterial soap and water 3. Peri-wound Care: Barrier cream Moisturizing lotion 4. Dressing Applied: Hydrafera Blue 5. Secondary Dressing Applied ABD Pad 7. Secured with 4-Layer Compression System - Right Lower  Extremity Notes unna paste to anchor Electronic Signature(s) Signed: 03/25/2018 10:00:00 AM By: Elliot Gurney, BSN, RN, CWS, Kim RN, BSN Entered By: Elliot Gurney, BSN, RN, CWS, Kim on 03/25/2018 09:10:32

## 2018-03-28 ENCOUNTER — Encounter: Payer: Self-pay | Admitting: Physician Assistant

## 2018-04-01 ENCOUNTER — Encounter: Payer: Self-pay | Admitting: Physician Assistant

## 2018-04-01 NOTE — Progress Notes (Signed)
HOUA, NIE ([id number]) Visit Report for 03/28/2018 Chief Complaint Document Details Patient Name: [name], [name] Date of Service: 03/28/2018 2:00 PM Medical Record Number: [id number] Patient Account Number: [id number] Date of Birth/Sex: 1981-01-08 (37 y.o. F) Treating RN: Phillis Haggis Primary Care Provider: PATIENT, NO Other Clinician: Referring Provider: Referral, Self Treating Provider/Extender: STONE III, HOYT Weeks in Treatment: 28 Information Obtained from: Patient Chief Complaint She is here in follow up evaluation of right lower extremity ulcers Electronic Signature(s) Signed: 03/29/2018 2:32:11 PM By: Lenda Kelp PA-C Entered By: Lenda Kelp on 03/28/2018 14:06:23 [name], [name] ([id number]) -------------------------------------------------------------------------------- Debridement Details Patient Name: Jessica Casey R. Date of Service: 03/28/2018 2:00 PM Medical Record Number: [id number] Patient Account Number: [id number] Date of Birth/Sex: 07/13/1981 (37 y.o. F) Treating RN: Phillis Haggis Primary Care Provider: PATIENT, NO Other Clinician: Referring Provider: Referral, Self Treating Provider/Extender: STONE III, HOYT Weeks in Treatment: 28 Debridement Performed for Wound #2 Right,Distal,Lateral Lower Leg Assessment: Performed By: Physician STONE III, HOYT E., PA-C Debridement Type: Debridement Pre-procedure Verification/Time Yes - 14:36 Out Taken: Start Time: 14:36 Pain Control: Lidocaine 4% Topical Solution Total Area Debrided (L x W): 1.1 (cm) x 0.4 (cm) = 0.44 (cm) Tissue and other material Viable, Non-Viable, Slough, Subcutaneous, Fibrin/Exudate, Slough debrided: Level: Skin/Subcutaneous Tissue Debridement Description: Excisional Instrument: Curette Bleeding: Minimum Hemostasis Achieved: Pressure End Time: 14:37 Procedural Pain: 0 Post Procedural Pain: 0 Response to Treatment: Procedure was tolerated well Level of Consciousness: Awake and  Alert Post Procedure Vitals: Temperature: 98.2 Pulse: 100 Respiratory Rate: 20 Blood Pressure: Systolic Blood Pressure: 134 Diastolic Blood Pressure: 82 Post Debridement Measurements of Total Wound Length: (cm) 1.1 Width: (cm) 0.4 Depth: (cm) 0.3 Volume: (cm) 0.104 Character of Wound/Ulcer Post Debridement: Requires Further Debridement Post Procedure Diagnosis Same as Pre-procedure Electronic Signature(s) Signed: 03/29/2018 2:32:11 PM By: Lenda Kelp PA-C Signed: 03/29/2018 4:00:06 PM By: [name] Entered By: [name] on 03/28/2018 14:38:38 [name], [name] ([id number]) -------------------------------------------------------------------------------- HPI Details Patient Name: Jessica Casey R. Date of Service: 03/28/2018 2:00 PM Medical Record Number: [id number] Patient Account Number: [id number] Date of Birth/Sex: 07-25-1981 (37 y.o. F) Treating RN: Phillis Haggis Primary Care Provider: PATIENT, NO Other Clinician: Referring Provider: Referral, Self Treating Provider/Extender: STONE III, HOYT Weeks in Treatment: 28 History of Present Illness HPI Description: 37 year old patient well known to our [facility] wound care clinic where she has been seen since 2016 for bilateral lower extremity venous insufficiency disease with lymphedema and multiple ulcerations associated with morbid obesity. she had custom-made compression stockings and lymphedema pumps which were used in the past. most recently she was admitted to the hospital between October 11 and 09/02/2017 with sepsis, lower extremity wounds and lymphedema.she was initially treated in the outpatient with Keflex and Bactrim. she was initially treated in the hospital with vancomycin and Zosyn and changed over to Unasyn until her white count improved and her blood cultures were negative for 3 days. After her inpatient management she was discharged home on Augmentin to end on 09/13/2017 with a 14 day course. she has  had outpatient vascular duplex scans completed in November 2017 and her right ABI was 1.1 and the left ABI is 1.3. she had normal toe brachial indices bilaterally.she had three-vessel runoff in the right lower extremity and two-vessel runoff in the left lower extremity. On questioning the patient she does have custom made compression stockings and also has a lymphedema pump but has not been using it appropriately and has not been taking good care of herself.  09/17/2017 -- she returns today with compression stockings on the left side and the right side has had significant amount of drainage and has a very strong odor 09/24/2017 -- the drainage is increased significantly and she has more lymphedema and a very strong odor to her wound. Though she does not have systemic symptoms, or overt infection I believe she will benefit from some doxycycline given empirically. 10/01/2017 -- after starting the doxycycline and changing the dressing twice a week her symptoms and signs have definitely improved overall. 10/08/2017 -- she has completed her course of doxycycline but continues to have a lot of drainage and needs twice a week dressing changes. 11/08/17-she is here in follow-up evaluation for right lower extremity ulcers. She admits to using her lymphedema pumps twice daily, one hour per session. she is voicing no complaints or concerns, no signs of infection will change to [facility] 12/14/17 on evaluation today patient appears to be doing very well in regard to her wounds. She has been tolerating the dressing changes she continues to develop some portly the adherent granular tissue on the surface of the wound with some Slough. Obviously we are trying to get too much better wound bed. With that being said the hyper granulation the Hydrofera Blue Dressing to have helped with which is excellent news. However I think it may be time to try something a little bit different at this point. 01/11/18 on evaluation  today patient appears to be doing fairly well in regard to her right lateral lower extremity ulcers. This shows excellent signs of filling in which is great news. There does not appear to be any evidence of infection which is also good news. She does continue to work as well is good school. She is having no pain. 01/22/18 on evaluation today patient appears to be doing a little bit worse in my opinion in regard to the overall quality of that granulation on her right lower extremity. She was not here last week due to being sick with a stomach virus this may have something to do with the fact that her wound appears to be a little bit worse. With that being said I'm also thinking that after switching from the [facility] Dressing to the silver collagen would really has not looked that's good in my opinion. We may want to swit 02/11/18 on evaluation today patient's right lower/lateral lower extremity ulcers appear to be doing very well at this point. [name], [name] ([id number]) Especially the more proximal ulcer has filled in much closer to surface which is good news. Nonetheless both show signs of improvement which is great news. There does not appear to be any evidence of infection which is also good news. In general patient has been doing well tolerating the wraps as well as the [employer]. 02/18/18 on evaluation today patient appears to be doing a little bit worse in regard to the periwound region the wounds themselves do not look much deteriorated to me. With that being said she has several small blisters/pustules noted in the periwound and there was a significant amount of drainage and maceration compared to previous. There has been a time that we had to bring her back for twice a week dressing changes as far as her wrap was concerned it has been a while since we've done that however. With that being said the patient has been having some burning and in general I'm concerned about the  possibility of infection. She has previously taken doxycycline with good  result. Fortunately there does not appear to be any evidence of overall worsening in regard to the size of the wound and in fact the upper wound actually appears to be showing signs of good epithelialization. 03/18/18 on evaluation today patient appears to be doing excellent in regard to her right lateral lower extremity ulcer. She has been tolerating the dressing changes without complication. Fortunately this seems to be making great progress. Overall I see no signs of infection and there is dramatic improvement overall even compared to last week. 03/28/18 on evaluation today patient appears to be doing very well in regard to her right lateral lower surety ulcer. She has been tolerating the dressing changes without complication at this point. She states currently that she's having no significant discomfort which is excellent as well. Overall I'm pleased with how things seem to be progressing. Electronic Signature(s) Signed: 03/29/2018 2:32:11 PM By: Lenda Kelp PA-C Entered By: Lenda Kelp on 03/28/2018 15:04:53 [name] ([id number]) -------------------------------------------------------------------------------- Physical Exam Details Patient Name: Jessica Casey R. Date of Service: 03/28/2018 2:00 PM Medical Record Number: [id number] Patient Account Number: [id number] Date of Birth/Sex: 19-Oct-1981 (37 y.o. F) Treating RN: Phillis Haggis Primary Care Provider: PATIENT, NO Other Clinician: Referring Provider: Referral, Self Treating Provider/Extender: STONE III, HOYT Weeks in Treatment: 28 Constitutional Obese and well-hydrated in no acute distress. Respiratory normal breathing without difficulty. clear to auscultation bilaterally. Cardiovascular regular rate and rhythm with normal S1, S2. Psychiatric this patient is able to make decisions and demonstrates good insight into disease process. Alert and Oriented  x 3. pleasant and cooperative. Notes Patient's wound shows evidence of granulation with some slough coming of the surface which was sharply debrided today she tolerated this with good result post debridement the wound beds both appear to be doing better. Electronic Signature(s) Signed: 03/29/2018 2:32:11 PM By: Lenda Kelp PA-C Entered By: Lenda Kelp on 03/28/2018 15:05:38 [name] ([id number]) -------------------------------------------------------------------------------- Physician Orders Details Patient Name: Jessica Casey R. Date of Service: 03/28/2018 2:00 PM Medical Record Number: [id number] Patient Account Number: [id number] Date of Birth/Sex: 01-13-1981 (37 y.o. F) Treating RN: Phillis Haggis Primary Care Provider: PATIENT, NO Other Clinician: Referring Provider: Referral, Self Treating Provider/Extender: STONE III, HOYT Weeks in Treatment: 28 Verbal / Phone Orders: Yes Clinician: Ashok [name], [name] Read Back and Verified: Yes Diagnosis Coding ICD-10 Coding Code Description L97.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d I89.0 Lymphedema, not elsewhere classified I87.311 Chronic venous hypertension (idiopathic) with ulcer of right lower extremity E66.01 Morbid (severe) obesity due to excess calories Wound Cleansing Wound #2 Right,Distal,Lateral Lower Leg o Clean wound with Normal Saline. o Cleanse wound with mild soap and water Anesthetic (add to Medication List) Wound #2 Right,Distal,Lateral Lower Leg o Topical Lidocaine 4% cream applied to wound bed prior to debridement (In Clinic Only). Skin Barriers/Peri-Wound Care Wound #2 Right,Distal,Lateral Lower Leg o Barrier cream o Moisturizing lotion Primary Wound Dressing Wound #2 Right,Distal,Lateral Lower Leg o Hydrafera Blue Ready Transfer Secondary Dressing Wound #2 Right,Distal,Lateral Lower Leg o ABD pad Dressing Change Frequency Wound #2 Right,Distal,Lateral Lower  Leg o Change dressing every week Follow-up Appointments o Other: - Monday and Thursday appointment Edema Control Wound #2 Right,Distal,Lateral Lower Leg o 4-Layer Compression System - Right Lower Extremity - unna to anchor o Elevate legs to the level of the heart and pump ankles as often as possible [name], [name] ([id number]) Additional Orders / Instructions Wound #2 Right,Distal,Lateral Lower Leg o Vitamin A; Vitamin C, Zinc   o Increase protein intake. o Other: - Please watch your salt (sodium) intake Patient Medications Allergies: No Known Allergies Notifications Medication Indication Start End lidocaine DOSE 1 - topical 4 % cream - 1 cream topical Electronic Signature(s) Signed: 03/29/2018 2:32:11 PM By: Lenda Kelp PA-C Signed: 03/29/2018 4:00:06 PM By: [name] Entered By: [name] on 03/28/2018 14:35:54 [name], [name] ([id number]) -------------------------------------------------------------------------------- Prescription 03/28/2018 Patient Name: Jessica Casey R. Provider: Lenda Kelp PA-C Date of Birth: 12/07/1980 NPI#: [id number] Sex: F DEA#: [id number] Phone #: [phone] License #: Patient Address: [facility] [address] [facility] [place], [place] [address] [address] [place], [place] [address] [phone] Allergies No Known Allergies Medication Medication: Route: Strength: Form: lidocaine topical 4% cream Class: TOPICAL LOCAL ANESTHETICS Dose: Frequency / Time: Indication: 1 1 cream topical Number of Refills: Number of Units: 0 Generic Substitution: Start Date: End Date: Administered at Substitution Permitted Facility: Yes Time Administered: Time Discontinued: Note to Pharmacy: Signature(s): Date(s): Electronic Signature(s) Signed: 03/29/2018 2:32:11 PM By: Lenda Kelp PA-C Signed: 03/29/2018 4:00:06 PM By: [name] Entered  By: [name] on 03/28/2018 14:35:55 [name], [name] ([id number]) [name], [name] ([id number]) --------------------------------------------------------------------------------  Problem List Details Patient Name: Jessica Casey R. Date of Service: 03/28/2018 2:00 PM Medical Record Number: [id number] Patient Account Number: [id number] Date of Birth/Sex: 1981-06-09 (37 y.o. F) Treating RN: Phillis Haggis Primary Care Provider: PATIENT, NO Other Clinician: Referring Provider: Referral, Self Treating Provider/Extender: STONE III, HOYT Weeks in Treatment: 28 Active Problems ICD-10 Impacting Encounter Code Description Active Date Wound Healing Diagnosis L97.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d 09/10/2017 Yes I89.0 Lymphedema, not elsewhere classified 09/10/2017 Yes I87.311 Chronic venous hypertension (idiopathic) with ulcer of right 09/10/2017 Yes lower extremity E66.01 Morbid (severe) obesity due to excess calories 09/10/2017 Yes Inactive Problems Resolved Problems Electronic Signature(s) Signed: 03/29/2018 2:32:11 PM By: Lenda Kelp PA-C Entered By: Lenda Kelp on 03/28/2018 14:06:15 [name], [name] ([id number]) -------------------------------------------------------------------------------- Progress Note Details Patient Name: Jessica Casey R. Date of Service: 03/28/2018 2:00 PM Medical Record Number: [id number] Patient Account Number: [id number] Date of Birth/Sex: Jan 19, 1981 (37 y.o. F) Treating RN: Phillis Haggis Primary Care Provider: PATIENT, NO Other Clinician: Referring Provider: Referral, Self Treating Provider/Extender: STONE III, HOYT Weeks in Treatment: 28 Subjective Chief Complaint Information obtained from Patient She is here in follow up evaluation of right lower extremity ulcers History of Present Illness (HPI) 37 year old patient well known to our [facility] wound care clinic where she has been seen since 2016 for bilateral  lower extremity venous insufficiency disease with lymphedema and multiple ulcerations associated with morbid obesity. she had custom-made compression stockings and lymphedema pumps which were used in the past. most recently she was admitted to the hospital between October 11 and 09/02/2017 with sepsis, lower extremity wounds and lymphedema.she was initially treated in the outpatient with Keflex and Bactrim. she was initially treated in the hospital with vancomycin and Zosyn and changed over to Unasyn until her white count improved and her blood cultures were negative for 3 days. After her inpatient management she was discharged home on Augmentin to end on 09/13/2017 with a 14 day course. she has had outpatient vascular duplex scans completed in November 2017 and her right ABI was 1.1 and the left ABI is 1.3. she had normal toe brachial indices bilaterally.she had three-vessel runoff in the right lower extremity and two-vessel runoff in the left lower extremity. On questioning the  patient she does have custom made compression stockings and also has a lymphedema pump but has not been using it appropriately and has not been taking good care of herself. 09/17/2017 -- she returns today with compression stockings on the left side and the right side has had significant amount of drainage and has a very strong odor 09/24/2017 -- the drainage is increased significantly and she has more lymphedema and a very strong odor to her wound. Though she does not have systemic symptoms, or overt infection I believe she will benefit from some doxycycline given empirically. 10/01/2017 -- after starting the doxycycline and changing the dressing twice a week her symptoms and signs have definitely improved overall. 10/08/2017 -- she has completed her course of doxycycline but continues to have a lot of drainage and needs twice a week dressing changes. 11/08/17-she is here in follow-up evaluation for right lower  extremity ulcers. She admits to using her lymphedema pumps twice daily, one hour per session. she is voicing no complaints or concerns, no signs of infection will change to [facility] 12/14/17 on evaluation today patient appears to be doing very well in regard to her wounds. She has been tolerating the dressing changes she continues to develop some portly the adherent granular tissue on the surface of the wound with some Slough. Obviously we are trying to get too much better wound bed. With that being said the hyper granulation the Hydrofera Blue Dressing to have helped with which is excellent news. However I think it may be time to try something a little bit different at this point. 01/11/18 on evaluation today patient appears to be doing fairly well in regard to her right lateral lower extremity ulcers. This shows excellent signs of filling in which is great news. There does not appear to be any evidence of infection which is also good news. She does continue to work as well is good school. She is having no pain. 01/22/18 on evaluation today patient appears to be doing a little bit worse in my opinion in regard to the overall quality of that [place], Maimouna R. ([id number]) granulation on her right lower extremity. She was not here last week due to being sick with a stomach virus this may have something to do with the fact that her wound appears to be a little bit worse. With that being said I'm also thinking that after switching from the [facility] Dressing to the silver collagen would really has not looked that's good in my opinion. We may want to swit 02/11/18 on evaluation today patient's right lower/lateral lower extremity ulcers appear to be doing very well at this point. Especially the more proximal ulcer has filled in much closer to surface which is good news. Nonetheless both show signs of improvement which is great news. There does not appear to be any evidence of infection which is also good  news. In general patient has been doing well tolerating the wraps as well as the [employer]. 02/18/18 on evaluation today patient appears to be doing a little bit worse in regard to the periwound region the wounds themselves do not look much deteriorated to me. With that being said she has several small blisters/pustules noted in the periwound and there was a significant amount of drainage and maceration compared to previous. There has been a time that we had to bring her back for twice a week dressing changes as far as her wrap was concerned it has been a while since we've done  that however. With that being said the patient has been having some burning and in general I'm concerned about the possibility of infection. She has previously taken doxycycline with good result. Fortunately there does not appear to be any evidence of overall worsening in regard to the size of the wound and in fact the upper wound actually appears to be showing signs of good epithelialization. 03/18/18 on evaluation today patient appears to be doing excellent in regard to her right lateral lower extremity ulcer. She has been tolerating the dressing changes without complication. Fortunately this seems to be making great progress. Overall I see no signs of infection and there is dramatic improvement overall even compared to last week. 03/28/18 on evaluation today patient appears to be doing very well in regard to her right lateral lower surety ulcer. She has been tolerating the dressing changes without complication at this point. She states currently that she's having no significant discomfort which is excellent as well. Overall I'm pleased with how things seem to be progressing. Patient History Information obtained from Patient. Family History Kidney Disease - Mother, No family history of Cancer, Diabetes, Heart Disease, Hereditary Spherocytosis, Hypertension, Lung Disease, Seizures, Stroke, Thyroid Problems,  Tuberculosis. Social History Never smoker, Marital Status - Married, Alcohol Use - Never, Drug Use - No History, Caffeine Use - Daily. Review of Systems (ROS) Constitutional Symptoms (General Health) Denies complaints or symptoms of Fever, Chills. Respiratory The patient has no complaints or symptoms. Cardiovascular Complains or has symptoms of LE edema. Objective Constitutional Obese and well-hydrated in no acute distress. [place], Daenerys R. ([id number]) Vitals Time Taken: 2:16 PM, Height: 74 in, Weight: 505 lbs, BMI: 64.8, Temperature: 98.2 F, Pulse: 114 bpm, Respiratory Rate: 20 breaths/min, Blood Pressure: 134/82 mmHg. Respiratory normal breathing without difficulty. clear to auscultation bilaterally. Cardiovascular regular rate and rhythm with normal S1, S2. Psychiatric this patient is able to make decisions and demonstrates good insight into disease process. Alert and Oriented x 3. pleasant and cooperative. General Notes: Patient's wound shows evidence of granulation with some slough coming of the surface which was sharply debrided today she tolerated this with good result post debridement the wound beds both appear to be doing better. Integumentary (Hair, Skin) Wound #2 status is Open. Original cause of wound was Gradually Appeared. The wound is located on the Right,Distal,Lateral Lower Leg. The wound measures 1.1cm length x 0.4cm width x 0.2cm depth; 0.346cm^2 area and 0.069cm^3 volume. There is Fat Layer (Subcutaneous Tissue) Exposed exposed. There is no tunneling or undermining noted. There is a small amount of serosanguineous drainage noted. The wound margin is flat and intact. There is large (67-100%) red granulation within the wound bed. There is no necrotic tissue within the wound bed. The periwound skin appearance exhibited: Dry/Scaly. The periwound skin appearance did not exhibit: Callus, Crepitus, Excoriation, Induration, Rash, Scarring, Maceration, Atrophie Blanche,  Cyanosis, Ecchymosis, Hemosiderin Staining, Mottled, Pallor, Rubor, Erythema. Periwound temperature was noted as No Abnormality. The periwound has tenderness on palpation. Assessment Active Problems ICD-10 L97.212 - 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d I89.0 - Lymphedema, not elsewhere classified I87.311 - Chronic venous hypertension (idiopathic) with ulcer of right lower extremity E66.01 - Morbid (severe) obesity due to excess calories Procedures Wound #2 Pre-procedure diagnosis of Wound #2 is a Lymphedema located on the Right,Distal,Lateral Lower Leg . There was a Excisional Skin/Subcutaneous Tissue Debridement with a total area of 0.44 sq cm performed by STONE III, HOYT E., PA-C. With the following instrument(s): Curette. to  remove Viable and Non-Viable tissue/material Material removed includes Subcutaneous Tissue, and Slough, Fibrin/Exudate, and Slough after achieving pain control using Lidocaine 4% Topical Solution. No specimens were taken. A time out was conducted at 14:36, prior to the start of the procedure. A Minimum amount of bleeding was controlled with Pressure. The procedure was tolerated well with a pain level of 0 throughout and a pain level of 0 following the procedure. Patient s Level of Consciousness post procedure was recorded as Awake and Alert and post-procedure vitals were taken including Temperature: 98.2 F, Pulse: 100 bpm, Respiratory Rate: 20 breaths/min, Halderman, Anajah R. ([id number]) Blood Pressure: (134)/(82) mmHg. Post Debridement Measurements: 1.1cm length x 0.4cm width x 0.3cm depth; 0.104cm^3 volume. Character of Wound/Ulcer Post Debridement requires further debridement. Post procedure Diagnosis Wound #2: Same as Pre-Procedure Plan Wound Cleansing: Wound #2 Right,Distal,Lateral Lower Leg: Clean wound with Normal Saline. Cleanse wound with mild soap and water Anesthetic (add to Medication List): Wound #2 Right,Distal,Lateral Lower  Leg: Topical Lidocaine 4% cream applied to wound bed prior to debridement (In Clinic Only). Skin Barriers/Peri-Wound Care: Wound #2 Right,Distal,Lateral Lower Leg: Barrier cream Moisturizing lotion Primary Wound Dressing: Wound #2 Right,Distal,Lateral Lower Leg: Hydrafera Blue Ready Transfer Secondary Dressing: Wound #2 Right,Distal,Lateral Lower Leg: ABD pad Dressing Change Frequency: Wound #2 Right,Distal,Lateral Lower Leg: Change dressing every week Follow-up Appointments: Other: - Monday and Thursday appointment Edema Control: Wound #2 Right,Distal,Lateral Lower Leg: 4-Layer Compression System - Right Lower Extremity - unna to anchor Elevate legs to the level of the heart and pump ankles as often as possible Additional Orders / Instructions: Wound #2 Right,Distal,Lateral Lower Leg: Vitamin A; Vitamin C, Zinc Increase protein intake. Other: - Please watch your salt (sodium) intake The following medication(s) was prescribed: lidocaine topical 4 % cream 1 1 cream topical was prescribed at facility I'm gonna recommend currently that we continue with the Current wound care measures for the next week. She's in agreement with this plan. We will subsequently see were things stand at that point hopefully should be doing even better. In regard to her heart rate she had a normal rhythm her heart rate was retaken by myself and measured 100. I think that this may just be a little dehydration or so I recommended she drink plenty of fluids and recheck this at a local drugstore or possibly [facility] to see how it's doing or she didn't check it herself just using her pulse and a clock obviously. Otherwise will see her for reevaluation in one weeks time if anything changes she'll let us know. Please see above for specific wound care orders. We will see patient for re-evaluation in 1 week(s) here in the clinic. If anything worsens or changes patient will contact our office for additional  recommendations. INEZ, STANTZ ([id number]) Electronic Signature(s) Signed: 03/29/2018 2:32:11 PM By: Lenda Kelp PA-C Entered By: Lenda Kelp on 03/28/2018 15:06:31 [name] ([id number]) -------------------------------------------------------------------------------- ROS/PFSH Details Patient Name: Jessica Casey R. Date of Service: 03/28/2018 2:00 PM Medical Record Number: [id number] Patient Account Number: [id number] Date of Birth/Sex: 12-Aug-1981 (37 y.o. F) Treating RN: Phillis Haggis Primary Care Provider: PATIENT, NO Other Clinician: Referring Provider: Referral, Self Treating Provider/Extender: STONE III, HOYT Weeks in Treatment: 28 Information Obtained From Patient Wound History Do you currently have one or more open woundso Yes How many open wounds do you currently haveo 1 Approximately how long have you had your woundso 1 month How have you been treating your wound(s) until nowo aquacel ag Has your  wound(s) ever healed and then re-openedo No Have you had any lab work done in the past montho No Have you tested positive for an antibiotic resistant organism (MRSA, VRE)o No Have you tested positive for osteomyelitis (bone infection)o No Have you had any tests for circulation on your legso Yes Who ordered the testo [facility] Where was the test doneo [facility] Constitutional Symptoms (General Health) Complaints and Symptoms: Negative for: Fever; Chills Cardiovascular Complaints and Symptoms: Positive for: LE edema Medical History: Positive for: Hypertension Negative for: Angina; Arrhythmia; Congestive Heart Failure; Coronary Artery Disease; Deep Vein Thrombosis; Hypotension; Myocardial Infarction; Peripheral Arterial Disease; Peripheral Venous Disease; Phlebitis; Vasculitis Hematologic/Lymphatic Medical History: Positive for: Lymphedema Respiratory Complaints and Symptoms: No Complaints or Symptoms Medical History: Negative for: Aspiration; Asthma; Chronic  Obstructive Pulmonary Disease (COPD); Pneumothorax; Sleep Apnea; Tuberculosis Immunizations Pneumococcal Vaccine: Received Pneumococcal Vaccination: No Immunization NotesAMARYS, SLIWINSKI ([id number]) up to date Implantable Devices Family and Social History Cancer: No; Diabetes: No; Heart Disease: No; Hereditary Spherocytosis: No; Hypertension: No; Kidney Disease: Yes - Mother; Lung Disease: No; Seizures: No; Stroke: No; Thyroid Problems: No; Tuberculosis: No; Never smoker; Marital Status - Married; Alcohol Use: Never; Drug Use: No History; Caffeine Use: Daily; Financial Concerns: No; Food, Clothing or Shelter Needs: No; Support System Lacking: No; Transportation Concerns: No; Advanced Directives: No; Patient does not want information on Advanced Directives Physician Affirmation I have reviewed and agree with the above information. Electronic Signature(s) Signed: 03/29/2018 2:32:11 PM By: Lenda Kelp PA-C Signed: 03/29/2018 4:00:06 PM By: [name] Entered By: Lenda Kelp on 03/28/2018 15:05:10 Faley, Shakiya R. ([id number]) -------------------------------------------------------------------------------- SuperBill Details Patient Name: Jessica Casey R. Date of Service: 03/28/2018 Medical Record Number: [id number] Patient Account Number: [id number] Date of Birth/Sex: 12-15-1980 (37 y.o. 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

## 2018-04-02 NOTE — Progress Notes (Signed)
Jessica, [name] ([id number]) Visit Report for 04/01/2018 Arrival Information Details Patient Name: [name], [name] Date of Service: 04/01/2018 2:00 PM Medical Record Number: [id number] Patient Account Number: [id number] Date of Birth/Sex: March 15, 1981 (37 y.o. F) Treating RN: Phillis Haggis Primary Care Roda Lauture: PATIENT, NO Other Clinician: Referring Valena Ivanov: Referral, Self Treating Ryosuke Ericksen/Extender: STONE III, HOYT Weeks in Treatment: 29 Visit Information History Since Last Visit All ordered tests and consults were completed: No Patient Arrived: Ambulatory Added or deleted any medications: No Arrival Time: 14:01 Any new allergies or adverse reactions: No Accompanied By: self Had a fall or experienced change in No Transfer Assistance: None activities of daily living that may affect Patient Identification Verified: Yes risk of falls: Secondary Verification Process Completed: Yes Signs or symptoms of abuse/neglect since last visito No Patient Requires Transmission-Based No Hospitalized since last visit: No Precautions: Implantable device outside of the clinic excluding No Patient Has Alerts: No cellular tissue based products placed in the center since last visit: Has Dressing in Place as Prescribed: Yes Has Compression in Place as Prescribed: Yes Pain Present Now: No Electronic Signature(s) Signed: 04/01/2018 3:58:02 PM By: [name] Entered By: [name] on 04/01/2018 14:04:06 Kinley, Nekita R. ([id number]) -------------------------------------------------------------------------------- Clinic Level of Care Assessment Details Patient Name: Jessica Casey R. Date of Service: 04/01/2018 2:00 PM Medical Record Number: [id number] Patient Account Number: [id number] Date of Birth/Sex: 23-Aug-1981 (37 y.o. F) Treating RN: Renne Crigler Primary Care Genna Casimir: PATIENT, NO Other Clinician: Referring Andrina Locken: Referral, Self Treating Subhan Hoopes/Extender: STONE III, HOYT Weeks  in Treatment: 29 Clinic Level of Care Assessment Items TOOL 4 Quantity Score X - Use when only an EandM is performed on FOLLOW-UP visit 1 0 ASSESSMENTS - Nursing Assessment / Reassessment X - Reassessment of Co-morbidities (includes updates in patient status) 1 10 X- 1 5 Reassessment of Adherence to Treatment Plan ASSESSMENTS - Wound and Skin Assessment / Reassessment X - Simple Wound Assessment / Reassessment - one wound 1 5  - 0 Complex Wound Assessment / Reassessment - multiple wounds  - 0 Dermatologic / Skin Assessment (not related to wound area) ASSESSMENTS - Focused Assessment  - Circumferential Edema Measurements - multi extremities 0  - 0 Nutritional Assessment / Counseling / Intervention  - 0 Lower Extremity Assessment (monofilament, tuning fork, pulses)  - 0 Peripheral Arterial Disease Assessment (using hand held doppler) ASSESSMENTS - Ostomy and/or Continence Assessment and Care  - Incontinence Assessment and Management 0  - 0 Ostomy Care Assessment and Management (repouching, etc.) PROCESS - Coordination of Care X - Simple Patient / Family Education for ongoing care 1 15  - 0 Complex (extensive) Patient / Family Education for ongoing care  - 0 Staff obtains Chiropractor, Records, Test Results / Process Orders  - 0 Staff telephones HHA, Nursing Homes / Clarify orders / etc  - 0 Routine Transfer to another Facility (non-emergent condition)  - 0 Routine Hospital Admission (non-emergent condition)  - 0 New Admissions / Manufacturing engineer / Ordering NPWT, Apligraf, etc.  - 0 Emergency Hospital Admission (emergent condition) X- 1 10 Simple Discharge Coordination Criger, Torie R. ([id number])  - 0 Complex (extensive) Discharge Coordination PROCESS - Special Needs  - Pediatric / Minor Patient Management 0  - 0 Isolation Patient Management  - 0 Hearing / Language / Visual special needs  - 0 Assessment of Community  assistance (transportation, D/C planning, etc.)  - 0 Additional assistance / Altered mentation  - 0 Support Surface(s) Assessment (bed, cushion, seat, etc.) INTERVENTIONS - Wound Cleansing /  Measurement X - Simple Wound Cleansing - one wound 1 5  - 0 Complex Wound Cleansing - multiple wounds X- 1 5 Wound Imaging (photographs - any number of wounds)  - 0 Wound Tracing (instead of photographs) X- 1 5 Simple Wound Measurement - one wound  - 0 Complex Wound Measurement - multiple wounds INTERVENTIONS - Wound Dressings X - Small Wound Dressing one or multiple wounds 1 10  - 0 Medium Wound Dressing one or multiple wounds  - 0 Large Wound Dressing one or multiple wounds  - 0 Application of Medications - topical  - 0 Application of Medications - injection INTERVENTIONS - Miscellaneous  - External ear exam 0  - 0 Specimen Collection (cultures, biopsies, blood, body fluids, etc.)  - 0 Specimen(s) / Culture(s) sent or taken to Lab for analysis  - 0 Patient Transfer (multiple staff / Nurse, adult / Similar devices)  - 0 Simple Staple / Suture removal (25 or less)  - 0 Complex Staple / Suture removal (26 or more)  - 0 Hypo / Hyperglycemic Management (close monitor of Blood Glucose)  - 0 Ankle / Brachial Index (ABI) - do not check if billed separately X- 1 5 Vital Signs Sutch, Kery R. ([id number]) Has the patient been seen at the hospital within the last three years: Yes Total Score: 75 Level Of Care: New/Established - Level 2 Electronic Signature(s) Signed: 04/01/2018 4:06:44 PM By: Renne Crigler Entered By: Renne Crigler on 04/01/2018 14:43:27 Roebuck, Liora R. ([id number]) -------------------------------------------------------------------------------- Lower Extremity Assessment Details Patient Name: Jessica Casey R. Date of Service: 04/01/2018 2:00 PM Medical Record Number: [id number] Patient Account Number: [id number] Date of Birth/Sex:  1981/03/11 (37 y.o. F) Treating RN: Phillis Haggis Primary Care Champ Keetch: PATIENT, NO Other Clinician: Referring Keiandra Sullenger: Referral, Self Treating Jlon Betker/Extender: STONE III, HOYT Weeks in Treatment: 29 Edema Assessment Assessed: [Left: No] [Right: No] [Left: Edema] [Right: :] Calf Left: Right: Point of Measurement: 35 cm From Medial Instep cm 56.5 cm Ankle Left: Right: Point of Measurement: 9 cm From Medial Instep cm 34.5 cm Vascular Assessment Pulses: Dorsalis Pedis Palpable: [Right:Yes] Posterior Tibial Extremity colors, hair growth, and conditions: Extremity Color: [Right:Hyperpigmented] Hair Growth on Extremity: [Right:Yes] Temperature of Extremity: [Right:Warm] Capillary Refill: [Right:< 3 seconds] Toe Nail Assessment Left: Right: Thick: Yes Discolored: Yes Deformed: Yes Improper Length and Hygiene: Yes Electronic Signature(s) Signed: 04/01/2018 3:58:02 PM By: [name] Entered By: [name] on 04/01/2018 14:13:09 Noecker, Loreli R. ([id number]) -------------------------------------------------------------------------------- Multi Wound Chart Details Patient Name: Jessica Casey R. Date of Service: 04/01/2018 2:00 PM Medical Record Number: [id number] Patient Account Number: [id number] Date of Birth/Sex: 23-Jun-1981 (37 y.o. F) Treating RN: Renne Crigler Primary Care Tyreak Reagle: PATIENT, NO Other Clinician: Referring Quan Cybulski: Referral, Self Treating Shown Dissinger/Extender: STONE III, HOYT Weeks in Treatment: 29 Vital Signs Height(in): 74 Pulse(bpm): 104 Weight(lbs): 505 Blood Pressure(mmHg): 109/75 Body Mass Index(BMI): 65 Temperature(F): 98.4 Respiratory Rate 18 (breaths/min): Photos: [2:No Photos] [N/A:N/A] Wound Location: [2:Right Lower Leg - Lateral, Distal] [N/A:N/A] Wounding Event: [2:Gradually Appeared] [N/A:N/A] Primary Etiology: [2:Lymphedema] [N/A:N/A] Comorbid History: [2:Lymphedema, Hypertension] [N/A:N/A] Date Acquired: [2:08/08/2017]  [N/A:N/A] Weeks of Treatment: [2:29] [N/A:N/A] Wound Status: [2:Open] [N/A:N/A] Clustered Wound: [2:Yes] [N/A:N/A] Clustered Quantity: [2:2] [N/A:N/A] Measurements L x W x D [2:0.9x0.4x0.2] [N/A:N/A] (cm) Area (cm) : [2:0.283] [N/A:N/A] Volume (cm) : [2:0.057] [N/A:N/A] % Reduction in Area: [2:98.70%] [N/A:N/A] % Reduction in Volume: [2:99.10%] [N/A:N/A] Classification: [2:Full Thickness With Exposed Support Structures] [N/A:N/A] Exudate Amount: [2:Medium] [N/A:N/A] Exudate Type: [2:Serosanguineous] [N/A:N/A] Exudate Color: [2:red, brown] [N/A:N/A] Wound Margin: [2:Flat and  Intact] [N/A:N/A] Granulation Amount: [2:Large (67-100%)] [N/A:N/A] Granulation Quality: [2:Red] [N/A:N/A] Necrotic Amount: [2:None Present (0%)] [N/A:N/A] Exposed Structures: [2:Fat Layer (Subcutaneous Tissue) Exposed: Yes Fascia: No Tendon: No Muscle: No Joint: No Bone: No] [N/A:N/A] Epithelialization: [2:Medium (34-66%)] [N/A:N/A] Periwound Skin Texture: [2:Excoriation: No Induration: No Callus: No] [N/A:N/A] Crepitus: No Rash: No Scarring: No Periwound Skin Moisture: Maceration: No N/A N/A Dry/Scaly: No Periwound Skin Color: Atrophie Blanche: No N/A N/A Cyanosis: No Ecchymosis: No Erythema: No Hemosiderin Staining: No Mottled: No Pallor: No Rubor: No Temperature: No Abnormality N/A N/A Tenderness on Palpation: Yes N/A N/A Wound Preparation: Ulcer Cleansing: N/A N/A Rinsed/Irrigated with Saline, Other: soap and water Topical Anesthetic Applied: Other: lidocaine 4% Treatment Notes Electronic Signature(s) Signed: 04/01/2018 4:06:44 PM By: Renne Crigler Entered By: Renne Crigler on 04/01/2018 14:39:47 Bogie, Cristal Deer ([id number]) -------------------------------------------------------------------------------- Multi-Disciplinary Care Plan Details Patient Name: Jessica Casey R. Date of Service: 04/01/2018 2:00 PM Medical Record Number: [id number] Patient Account Number: [id number] Date  of Birth/Sex: 25-Jul-1981 (37 y.o. F) Treating RN: Renne Crigler Primary Care Kyndahl Jablon: PATIENT, NO Other Clinician: Referring Jasani Dolney: Referral, Self Treating Elgin Carn/Extender: STONE III, HOYT Weeks in Treatment: 29 Active Inactive ` Orientation to the Wound Care Program Nursing Diagnoses: Knowledge deficit related to the wound healing center program Goals: Patient/caregiver will verbalize understanding of the Wound Healing Center Program Date Initiated: 09/10/2017 Target Resolution Date: 11/23/2017 Goal Status: Active Interventions: Provide education on orientation to the wound center Notes: ` Venous Leg Ulcer Nursing Diagnoses: Knowledge deficit related to disease process and management Goals: Patient/caregiver will verbalize understanding of disease process and disease management Date Initiated: 09/10/2017 Target Resolution Date: 11/23/2017 Goal Status: Active Interventions: Assess peripheral edema status every visit. Notes: ` Wound/Skin Impairment Nursing Diagnoses: Impaired tissue integrity Goals: Ulcer/skin breakdown will heal within 14 weeks Date Initiated: 09/10/2017 Target Resolution Date: 11/24/2017 Goal Status: Active Interventions: JOZI, MALACHI ([id number]) Assess patient/caregiver ability to obtain necessary supplies Assess patient/caregiver ability to perform ulcer/skin care regimen upon admission and as needed Assess ulceration(s) every visit Notes: Electronic Signature(s) Signed: 04/01/2018 4:06:44 PM By: Renne Crigler Entered By: Renne Crigler on 04/01/2018 14:39:33 Campton, Amanat R. ([id number]) -------------------------------------------------------------------------------- Pain Assessment Details Patient Name: Jessica Casey R. Date of Service: 04/01/2018 2:00 PM Medical Record Number: [id number] Patient Account Number: [id number] Date of Birth/Sex: 08/05/1981 (37 y.o. F) Treating RN: Phillis Haggis Primary Care Karlea Mckibbin: PATIENT, NO Other  Clinician: Referring Blaiden Werth: Referral, Self Treating Camyla Camposano/Extender: STONE III, HOYT Weeks in Treatment: 29 Active Problems Location of Pain Severity and Description of Pain Patient Has Paino No Site Locations Pain Management and Medication Current Pain Management: Electronic Signature(s) Signed: 04/01/2018 3:58:02 PM By: [name] Entered By: [name] on 04/01/2018 14:04:53 Savitz, Leeya R. ([id number]) -------------------------------------------------------------------------------- Wound Assessment Details Patient Name: Jessica Casey R. Date of Service: 04/01/2018 2:00 PM Medical Record Number: [id number] Patient Account Number: [id number] Date of Birth/Sex: 1981-03-06 (37 y.o. F) Treating RN: Phillis Haggis Primary Care Toula Miyasaki: PATIENT, NO Other Clinician: Referring Ayzia Day: Referral, Self Treating Larico Dimock/Extender: STONE III, HOYT Weeks in Treatment: 29 Wound Status Wound Number: 2 Primary Etiology: Lymphedema Wound Location: Right Lower Leg - Lateral, Distal Wound Status: Open Wounding Event: Gradually Appeared Comorbid History: Lymphedema, Hypertension Date Acquired: 08/08/2017 Weeks Of Treatment: 29 Clustered Wound: Yes Photos Photo Uploaded By: [name] on 04/01/2018 15:54:15 Wound Measurements Length: (cm) 0.9 Width: (cm) 0.4 Depth: (cm) 0.2 Clustered Quantity: 2 Area: (cm) 0.283 Volume: (cm) 0.057 % Reduction in Area: 98.7% % Reduction in Volume: 99.1% Epithelialization: Medium (34-66%) Tunneling: No Undermining:  No Wound Description Full Thickness With Exposed Support Classification: Structures Wound Margin: Flat and Intact Exudate Medium Amount: Exudate Type: Serosanguineous Exudate Color: red, brown Foul Odor After Cleansing: No Slough/Fibrino Yes Wound Bed Granulation Amount: Large (67-100%) Exposed Structure Granulation Quality: Red Fascia Exposed: No Necrotic Amount: None Present (0%) Fat Layer  (Subcutaneous Tissue) Exposed: Yes Tendon Exposed: No Muscle Exposed: No Joint Exposed: No Bone Exposed: No Periwound Skin Texture Castrogiovanni, Cashae R. ([id number]) Texture Color No Abnormalities Noted: No No Abnormalities Noted: No Callus: No Atrophie Blanche: No Crepitus: No Cyanosis: No Excoriation: No Ecchymosis: No Induration: No Erythema: No Rash: No Hemosiderin Staining: No Scarring: No Mottled: No Pallor: No Moisture Rubor: No No Abnormalities Noted: No Dry / Scaly: No Temperature / Pain Maceration: No Temperature: No Abnormality Tenderness on Palpation: Yes Wound Preparation Ulcer Cleansing: Rinsed/Irrigated with Saline, Other: soap and water, Topical Anesthetic Applied: Other: lidocaine 4%, Electronic Signature(s) Signed: 04/01/2018 3:58:02 PM By: [name] Entered By: [name] on 04/01/2018 14:15:06 Karen, Kearra R. ([id number]) -------------------------------------------------------------------------------- Vitals Details Patient Name: Jessica Casey R. Date of Service: 04/01/2018 2:00 PM Medical Record Number: [id number] Patient Account Number: [id number] Date of Birth/Sex: Jan 23, 1981 (37 y.o. F) Treating RN: Phillis Haggis Primary Care Chesnie Capell: PATIENT, NO Other Clinician: Referring Briann Sarchet: Referral, Self Treating Harshitha Fretz/Extender: STONE III, HOYT Weeks in Treatment: 29 Vital Signs Time Taken: 14:05 Temperature (F): 98.4 Height (in): 74 Pulse (bpm): 104 Source: Stated Respiratory Rate (breaths/min): 18 Weight (lbs): 505 Blood Pressure (mmHg): 109/75 Body Mass Index (BMI): 64.8 Reference Range: 80 - 120 mg / dl Electronic Signature(s) Signed: 04/01/2018 3:58:02 PM By: [name] Entered By: [name] on 04/01/2018 14:06:11

## 2018-04-02 NOTE — Progress Notes (Signed)
[name] ([id number]) Visit Report for 04/01/2018 Chief Complaint Document Details Patient Name: [name] Date of Service: 04/01/2018 2:00 PM Medical Record Number: [id number] Patient Account Number: [id number] Date of Birth/Sex: 1981-09-16 (37 y.o. F) Treating RN: Renne Crigler Primary Care Provider: PATIENT, NO Other Clinician: Referring Provider: Referral, Self Treating Provider/Extender: STONE III, HOYT Weeks in Treatment: 29 Information Obtained from: Patient Chief Complaint She is here in follow up evaluation of right lower extremity ulcers Electronic Signature(s) Signed: 04/01/2018 4:12:37 PM By: Lenda Kelp PA-C Entered By: Lenda Kelp on 04/01/2018 14:16:04 [name], [name] ([id number]) -------------------------------------------------------------------------------- HPI Details Patient Name: [name] Date of Service: 04/01/2018 2:00 PM Medical Record Number: [id number] Patient Account Number: [id number] Date of Birth/Sex: [birth year] (37 y.o. F) Treating RN: Renne Crigler Primary Care Provider: PATIENT, NO Other Clinician: Referring Provider: Referral, Self Treating Provider/Extender: STONE III, HOYT Weeks in Treatment: 29 History of Present Illness HPI Description: 37 year old patient well known to our [facility] wound care clinic where she has been seen since 2016 for bilateral lower extremity venous insufficiency disease with lymphedema and multiple ulcerations associated with morbid obesity. she had custom-made compression stockings and lymphedema pumps which were used in the past. most recently she was admitted to the hospital between October 11 and 09/02/2017 with sepsis, lower extremity wounds and lymphedema.she was initially treated in the outpatient with Keflex and Bactrim. she was initially treated in the hospital with vancomycin and Zosyn and changed over to Unasyn until her white count improved and her blood cultures were negative for 3  days. After her inpatient management she was discharged home on Augmentin to end on 09/13/2017 with a 14 day course. she has had outpatient vascular duplex scans completed in November 2017 and her right ABI was 1.1 and the left ABI is 1.3. she had normal toe brachial indices bilaterally.she had three-vessel runoff in the right lower extremity and two-vessel runoff in the left lower extremity. On questioning the patient she does have custom made compression stockings and also has a lymphedema pump but has not been using it appropriately and has not been taking good care of herself. 09/17/2017 -- she returns today with compression stockings on the left side and the right side has had significant amount of drainage and has a very strong odor 09/24/2017 -- the drainage is increased significantly and she has more lymphedema and a very strong odor to her wound. Though she does not have systemic symptoms, or overt infection I believe she will benefit from some doxycycline given empirically. 10/01/2017 -- after starting the doxycycline and changing the dressing twice a week her symptoms and signs have definitely improved overall. 10/08/2017 -- she has completed her course of doxycycline but continues to have a lot of drainage and needs twice a week dressing changes. 11/08/17-she is here in follow-up evaluation for right lower extremity ulcers. She admits to using her lymphedema pumps twice daily, one hour per session. she is voicing no complaints or concerns, no signs of infection will change to [facility] 12/14/17 on evaluation today patient appears to be doing very well in regard to her wounds. She has been tolerating the dressing changes she continues to develop some portly the adherent granular tissue on the surface of the wound with some Slough. Obviously we are trying to get too much better wound bed. With that being said the hyper granulation the Hydrofera Blue Dressing to have helped with which is  excellent news. However I think it may be time  to try something a little bit different at this point. 01/11/18 on evaluation today patient appears to be doing fairly well in regard to her right lateral lower extremity ulcers. This shows excellent signs of filling in which is great news. There does not appear to be any evidence of infection which is also good news. She does continue to work as well is good school. She is having no pain. 01/22/18 on evaluation today patient appears to be doing a little bit worse in my opinion in regard to the overall quality of that granulation on her right lower extremity. She was not here last week due to being sick with a stomach virus this may have something to do with the fact that her wound appears to be a little bit worse. With that being said I'm also thinking that after switching from the [facility] Dressing to the silver collagen would really has not looked that's good in my opinion. We may want to swit 02/11/18 on evaluation today patient's right lower/lateral lower extremity ulcers appear to be doing very well at this point. [name], [name] ([id number]) Especially the more proximal ulcer has filled in much closer to surface which is good news. Nonetheless both show signs of improvement which is great news. There does not appear to be any evidence of infection which is also good news. In general patient has been doing well tolerating the wraps as well as the [employer]. 02/18/18 on evaluation today patient appears to be doing a little bit worse in regard to the periwound region the wounds themselves do not look much deteriorated to me. With that being said she has several small blisters/pustules noted in the periwound and there was a significant amount of drainage and maceration compared to previous. There has been a time that we had to bring her back for twice a week dressing changes as far as her wrap was concerned it has been a while  since we've done that however. With that being said the patient has been having some burning and in general I'm concerned about the possibility of infection. She has previously taken doxycycline with good result. Fortunately there does not appear to be any evidence of overall worsening in regard to the size of the wound and in fact the upper wound actually appears to be showing signs of good epithelialization. 03/18/18 on evaluation today patient appears to be doing excellent in regard to her right lateral lower extremity ulcer. She has been tolerating the dressing changes without complication. Fortunately this seems to be making great progress. Overall I see no signs of infection and there is dramatic improvement overall even compared to last week. 03/28/18 on evaluation today patient appears to be doing very well in regard to her right lateral lower surety ulcer. She has been tolerating the dressing changes without complication at this point. She states currently that she's having no significant discomfort which is excellent as well. Overall I'm pleased with how things seem to be progressing. 04/01/18 on evaluation today patient appears to be doing excellent in regard to her right lateral lower extremity ulcers. She has been tolerating the dressing changes without complication which is good news. With that being said the wraps still continues to show signs of helping with her fluid she does have Juxta-Lite compression stockings for when we are done with the current treatment regimen once everything heals. Mainly she just has the one area still remaining the smaller of the two wings is pretty much closed  at this point there's just a very slight opening noted. Electronic Signature(s) Signed: 04/01/2018 4:12:37 PM By: Lenda Kelp PA-C Entered By: Lenda Kelp on 04/01/2018 15:46:51 [name], [name] Kitchen  ([id number]) -------------------------------------------------------------------------------- Physical Exam Details Patient Name: Jessica Casey R. Date of Service: 04/01/2018 2:00 PM Medical Record Number: [id number] Patient Account Number: [id number] Date of Birth/Sex: 1981-03-04 (37 y.o. F) Treating RN: Renne Crigler Primary Care Provider: PATIENT, NO Other Clinician: Referring Provider: Referral, Self Treating Provider/Extender: STONE III, HOYT Weeks in Treatment: 29 Constitutional Obese and well-hydrated in no acute distress. Respiratory normal breathing without difficulty. clear to auscultation bilaterally. Cardiovascular regular rate and rhythm with normal S1, S2. trace pitting edema of the bilateral lower extremities. Psychiatric this patient is able to make decisions and demonstrates good insight into disease process. Alert and Oriented x 3. pleasant and cooperative. Notes Currently I am going to suggest that her wounds really does not need sharp debridement today I did mechanically debride with saline and gauze with good result and post debridement things appear to be doing much better. She tolerated this without complication. I'm gonna recommend that we continue with the Current wound care measures for the next week and we will see were things stand at that point. Electronic Signature(s) Signed: 04/01/2018 4:12:37 PM By: Lenda Kelp PA-C Entered By: Lenda Kelp on 04/01/2018 15:47:52 Sassone, Armanda [name] Kitchen ([id number]) -------------------------------------------------------------------------------- Physician Orders Details Patient Name: Jessica Casey R. Date of Service: 04/01/2018 2:00 PM Medical Record Number: [id number] Patient Account Number: [id number] Date of Birth/Sex: June 21, 1981 (37 y.o. F) Treating RN: Renne Crigler Primary Care Provider: PATIENT, NO Other Clinician: Referring Provider: Referral, Self Treating Provider/Extender: STONE III, HOYT Weeks in  Treatment: 60 Verbal / Phone Orders: No Diagnosis Coding ICD-10 Coding Code Description [phone] 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d I89.0 Lymphedema, not elsewhere classified I87.311 Chronic venous hypertension (idiopathic) with ulcer of right lower extremity E66.01 Morbid (severe) obesity due to excess calories Wound Cleansing Wound #2 Right,Distal,Lateral Lower Leg o Clean wound with Normal Saline. o Cleanse wound with mild soap and water Anesthetic (add to Medication List) Wound #2 Right,Distal,Lateral Lower Leg o Topical Lidocaine 4% cream applied to wound bed prior to debridement (In Clinic Only). Skin Barriers/Peri-Wound Care Wound #2 Right,Distal,Lateral Lower Leg o Barrier cream o Moisturizing lotion Primary Wound Dressing Wound #2 Right,Distal,Lateral Lower Leg o Hydrafera Blue Ready Transfer Secondary Dressing Wound #2 Right,Distal,Lateral Lower Leg o ABD pad Dressing Change Frequency Wound #2 Right,Distal,Lateral Lower Leg o Change dressing every week Follow-up Appointments o Other: - Monday and Thursday appointment Edema Control Wound #2 Right,Distal,Lateral Lower Leg o 4-Layer Compression System - Right Lower Extremity - unna to anchor o Elevate legs to the level of the heart and pump ankles as often as possible Rufer, Uliana R. ([id number]) Additional Orders / Instructions Wound #2 Right,Distal,Lateral Lower Leg o Vitamin A; Vitamin C, Zinc o Increase protein intake. o Other: - Please watch your salt (sodium) intake Electronic Signature(s) Signed: 04/01/2018 4:06:44 PM By: Renne Crigler Signed: 04/01/2018 4:12:37 PM By: Lenda Kelp PA-C Entered By: Renne Crigler on 04/01/2018 14:42:38 [name], [name] ([id number]) -------------------------------------------------------------------------------- Problem List Details Patient Name: Jessica Casey R. Date of Service: 04/01/2018 2:00 PM Medical Record  Number: [id number] Patient Account Number: [id number] Date of Birth/Sex: 03-05-1981 (37 y.o. F) Treating RN: Renne Crigler Primary Care Provider: PATIENT, NO Other Clinician: Referring Provider: Referral, Self Treating Provider/Extender: STONE III, HOYT Weeks in Treatment: 29 Active Problems ICD-10 Impacting Encounter Code  Description Active Date Wound Healing Diagnosis L97.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d 09/10/2017 Yes I89.0 Lymphedema, not elsewhere classified 09/10/2017 Yes I87.311 Chronic venous hypertension (idiopathic) with ulcer of right 09/10/2017 Yes lower extremity E66.01 Morbid (severe) obesity due to excess calories 09/10/2017 Yes Inactive Problems Resolved Problems Electronic Signature(s) Signed: 04/01/2018 4:12:37 PM By: Lenda Kelp PA-C Entered By: Lenda Kelp on 04/01/2018 14:15:52 [name], [name] ([id number]) -------------------------------------------------------------------------------- Progress Note Details Patient Name: Jessica Casey R. Date of Service: 04/01/2018 2:00 PM Medical Record Number: [id number] Patient Account Number: [id number] Date of Birth/Sex: July 24, 1981 (37 y.o. F) Treating RN: Renne Crigler Primary Care Provider: PATIENT, NO Other Clinician: Referring Provider: Referral, Self Treating Provider/Extender: STONE III, HOYT Weeks in Treatment: 29 Subjective Chief Complaint Information obtained from Patient She is here in follow up evaluation of right lower extremity ulcers History of Present Illness (HPI) 38 year old patient well known to our [facility] wound care clinic where she has been seen since 2016 for bilateral lower extremity venous insufficiency disease with lymphedema and multiple ulcerations associated with morbid obesity. she had custom-made compression stockings and lymphedema pumps which were used in the past. most recently she was admitted to the hospital between October 11 and  09/02/2017 with sepsis, lower extremity wounds and lymphedema.she was initially treated in the outpatient with Keflex and Bactrim. she was initially treated in the hospital with vancomycin and Zosyn and changed over to Unasyn until her white count improved and her blood cultures were negative for 3 days. After her inpatient management she was discharged home on Augmentin to end on 09/13/2017 with a 14 day course. she has had outpatient vascular duplex scans completed in November 2017 and her right ABI was 1.1 and the left ABI is 1.3. she had normal toe brachial indices bilaterally.she had three-vessel runoff in the right lower extremity and two-vessel runoff in the left lower extremity. On questioning the patient she does have custom made compression stockings and also has a lymphedema pump but has not been using it appropriately and has not been taking good care of herself. 09/17/2017 -- she returns today with compression stockings on the left side and the right side has had significant amount of drainage and has a very strong odor 09/24/2017 -- the drainage is increased significantly and she has more lymphedema and a very strong odor to her wound. Though she does not have systemic symptoms, or overt infection I believe she will benefit from some doxycycline given empirically. 10/01/2017 -- after starting the doxycycline and changing the dressing twice a week her symptoms and signs have definitely improved overall. 10/08/2017 -- she has completed her course of doxycycline but continues to have a lot of drainage and needs twice a week dressing changes. 11/08/17-she is here in follow-up evaluation for right lower extremity ulcers. She admits to using her lymphedema pumps twice daily, one hour per session. she is voicing no complaints or concerns, no signs of infection will change to [facility] 12/14/17 on evaluation today patient appears to be doing very well in regard to her wounds. She has been  tolerating the dressing changes she continues to develop some portly the adherent granular tissue on the surface of the wound with some Slough. Obviously we are trying to get too much better wound bed. With that being said the hyper granulation the Hydrofera Blue Dressing to have helped with which is excellent news. However I think it may be time to try something a little bit different at  this point. 01/11/18 on evaluation today patient appears to be doing fairly well in regard to her right lateral lower extremity ulcers. This shows excellent signs of filling in which is great news. There does not appear to be any evidence of infection which is also good news. She does continue to work as well is good school. She is having no pain. 01/22/18 on evaluation today patient appears to be doing a little bit worse in my opinion in regard to the overall quality of that Lauderbaugh, Nabilah R. ([id number]) granulation on her right lower extremity. She was not here last week due to being sick with a stomach virus this may have something to do with the fact that her wound appears to be a little bit worse. With that being said I'm also thinking that after switching from the [facility] Dressing to the silver collagen would really has not looked that's good in my opinion. We may want to swit 02/11/18 on evaluation today patient's right lower/lateral lower extremity ulcers appear to be doing very well at this point. Especially the more proximal ulcer has filled in much closer to surface which is good news. Nonetheless both show signs of improvement which is great news. There does not appear to be any evidence of infection which is also good news. In general patient has been doing well tolerating the wraps as well as the [employer]. 02/18/18 on evaluation today patient appears to be doing a little bit worse in regard to the periwound region the wounds themselves do not look much deteriorated to me. With that  being said she has several small blisters/pustules noted in the periwound and there was a significant amount of drainage and maceration compared to previous. There has been a time that we had to bring her back for twice a week dressing changes as far as her wrap was concerned it has been a while since we've done that however. With that being said the patient has been having some burning and in general I'm concerned about the possibility of infection. She has previously taken doxycycline with good result. Fortunately there does not appear to be any evidence of overall worsening in regard to the size of the wound and in fact the upper wound actually appears to be showing signs of good epithelialization. 03/18/18 on evaluation today patient appears to be doing excellent in regard to her right lateral lower extremity ulcer. She has been tolerating the dressing changes without complication. Fortunately this seems to be making great progress. Overall I see no signs of infection and there is dramatic improvement overall even compared to last week. 03/28/18 on evaluation today patient appears to be doing very well in regard to her right lateral lower surety ulcer. She has been tolerating the dressing changes without complication at this point. She states currently that she's having no significant discomfort which is excellent as well. Overall I'm pleased with how things seem to be progressing. 04/01/18 on evaluation today patient appears to be doing excellent in regard to her right lateral lower extremity ulcers. She has been tolerating the dressing changes without complication which is good news. With that being said the wraps still continues to show signs of helping with her fluid she does have Juxta-Lite compression stockings for when we are done with the current treatment regimen once everything heals. Mainly she just has the one area still remaining the smaller of the two wings is pretty much closed at  this point there's just a very  slight opening noted. Patient History Information obtained from Patient. Family History Kidney Disease - Mother, No family history of Cancer, Diabetes, Heart Disease, Hereditary Spherocytosis, Hypertension, Lung Disease, Seizures, Stroke, Thyroid Problems, Tuberculosis. Social History Never smoker, Marital Status - Married, Alcohol Use - Never, Drug Use - No History, Caffeine Use - Daily. Review of Systems (ROS) Constitutional Symptoms (General Health) Denies complaints or symptoms of Fever, Chills. Respiratory The patient has no complaints or symptoms. Cardiovascular Complains or has symptoms of LE edema. Psychiatric The patient has no complaints or symptoms. [name], [name] ([id number]) Objective Constitutional Obese and well-hydrated in no acute distress. Vitals Time Taken: 2:05 PM, Height: 74 in, Source: Stated, Weight: 505 lbs, BMI: 64.8, Temperature: 98.4 F, Pulse: 104 bpm, Respiratory Rate: 18 breaths/min, Blood Pressure: 109/75 mmHg. Respiratory normal breathing without difficulty. clear to auscultation bilaterally. Cardiovascular regular rate and rhythm with normal S1, S2. trace pitting edema of the bilateral lower extremities. Psychiatric this patient is able to make decisions and demonstrates good insight into disease process. Alert and Oriented x 3. pleasant and cooperative. General Notes: Currently I am going to suggest that her wounds really does not need sharp debridement today I did mechanically debride with saline and gauze with good result and post debridement things appear to be doing much better. She tolerated this without complication. I'm gonna recommend that we continue with the Current wound care measures for the next week and we will see were things stand at that point. Integumentary (Hair, Skin) Wound #2 status is Open. Original cause of wound was Gradually Appeared. The wound is located on the Right,Distal,Lateral Lower  Leg. The wound measures 0.9cm length x 0.4cm width x 0.2cm depth; 0.283cm^2 area and 0.057cm^3 volume. There is Fat Layer (Subcutaneous Tissue) Exposed exposed. There is no tunneling or undermining noted. There is a medium amount of serosanguineous drainage noted. The wound margin is flat and intact. There is large (67-100%) red granulation within the wound bed. There is no necrotic tissue within the wound bed. The periwound skin appearance did not exhibit: Callus, Crepitus, Excoriation, Induration, Rash, Scarring, Dry/Scaly, Maceration, Atrophie Blanche, Cyanosis, Ecchymosis, Hemosiderin Staining, Mottled, Pallor, Rubor, Erythema. Periwound temperature was noted as No Abnormality. The periwound has tenderness on palpation. Assessment Active Problems ICD-10 L97.212 - 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d I89.0 - Lymphedema, not elsewhere classified I87.311 - Chronic venous hypertension (idiopathic) with ulcer of right lower extremity E66.01 - Morbid (severe) obesity due to excess calories Plan [name], [name] ([id number]) Wound Cleansing: Wound #2 Right,Distal,Lateral Lower Leg: Clean wound with Normal Saline. Cleanse wound with mild soap and water Anesthetic (add to Medication List): Wound #2 Right,Distal,Lateral Lower Leg: Topical Lidocaine 4% cream applied to wound bed prior to debridement (In Clinic Only). Skin Barriers/Peri-Wound Care: Wound #2 Right,Distal,Lateral Lower Leg: Barrier cream Moisturizing lotion Primary Wound Dressing: Wound #2 Right,Distal,Lateral Lower Leg: Hydrafera Blue Ready Transfer Secondary Dressing: Wound #2 Right,Distal,Lateral Lower Leg: ABD pad Dressing Change Frequency: Wound #2 Right,Distal,Lateral Lower Leg: Change dressing every week Follow-up Appointments: Other: - Monday and Thursday appointment Edema Control: Wound #2 Right,Distal,Lateral Lower Leg: 4-Layer Compression System - Right Lower Extremity - unna to  anchor Elevate legs to the level of the heart and pump ankles as often as possible Additional Orders / Instructions: Wound #2 Right,Distal,Lateral Lower Leg: Vitamin A; Vitamin C, Zinc Increase protein intake. Other: - Please watch your salt (sodium) intake Patient is in agreement with the plan and we will continue with the current  orders per above. Will see were things stand next week. Please see above for specific wound care orders. We will see patient for re-evaluation in 1 week(s) here in the clinic. If anything worsens or changes patient will contact our office for additional recommendations. Electronic Signature(s) Signed: 04/01/2018 4:12:37 PM By: Lenda Kelp PA-C Entered By: Lenda Kelp on 04/01/2018 15:48:21 [name], [name] ([id number]) -------------------------------------------------------------------------------- ROS/PFSH Details Patient Name: Jessica Casey R. Date of Service: 04/01/2018 2:00 PM Medical Record Number: [id number] Patient Account Number: [id number] Date of Birth/Sex: 1981-01-30 (37 y.o. F) Treating RN: Renne Crigler Primary Care Provider: PATIENT, NO Other Clinician: Referring Provider: Referral, Self Treating Provider/Extender: STONE III, HOYT Weeks in Treatment: 29 Information Obtained From Patient Wound History Do you currently have one or more open woundso Yes How many open wounds do you currently haveo 1 Approximately how long have you had your woundso 1 month How have you been treating your wound(s) until nowo aquacel ag Has your wound(s) ever healed and then re-openedo No Have you had any lab work done in the past montho No Have you tested positive for an antibiotic resistant organism (MRSA, VRE)o No Have you tested positive for osteomyelitis (bone infection)o No Have you had any tests for circulation on your legso Yes Who ordered the testo [facility] Where was the test doneo [facility] Constitutional Symptoms (General Health) Complaints and  Symptoms: Negative for: Fever; Chills Cardiovascular Complaints and Symptoms: Positive for: LE edema Medical History: Positive for: Hypertension Negative for: Angina; Arrhythmia; Congestive Heart Failure; Coronary Artery Disease; Deep Vein Thrombosis; Hypotension; Myocardial Infarction; Peripheral Arterial Disease; Peripheral Venous Disease; Phlebitis; Vasculitis Hematologic/Lymphatic Medical History: Positive for: Lymphedema Respiratory Complaints and Symptoms: No Complaints or Symptoms Medical History: Negative for: Aspiration; Asthma; Chronic Obstructive Pulmonary Disease (COPD); Pneumothorax; Sleep Apnea; Tuberculosis Psychiatric Complaints and Symptoms: No Complaints or Symptoms [name], [name] ([id number]) Immunizations Pneumococcal Vaccine: Received Pneumococcal Vaccination: No Immunization Notes: up to date Implantable Devices Family and Social History Cancer: No; Diabetes: No; Heart Disease: No; Hereditary Spherocytosis: No; Hypertension: No; Kidney Disease: Yes - Mother; Lung Disease: No; Seizures: No; Stroke: No; Thyroid Problems: No; Tuberculosis: No; Never smoker; Marital Status - Married; Alcohol Use: Never; Drug Use: No History; Caffeine Use: Daily; Financial Concerns: No; Food, Clothing or Shelter Needs: No; Support System Lacking: No; Transportation Concerns: No; Advanced Directives: No; Patient does not want information on Advanced Directives Physician Affirmation I have reviewed and agree with the above information. Electronic Signature(s) Signed: 04/01/2018 4:06:44 PM By: Renne Crigler Signed: 04/01/2018 4:12:37 PM By: Lenda Kelp PA-C Entered By: Lenda Kelp on 04/01/2018 15:47:13 [name], [name] ([id number]) -------------------------------------------------------------------------------- SuperBill Details Patient Name: Jessica Casey R. Date of Service: 04/01/2018 Medical Record Number: [id number] Patient Account Number: [id number] Date of  Birth/Sex: 08/31/1981 (37 y.o. F) Treating RN: Renne Crigler Primary Care Provider: PATIENT, NO Other Clinician: Referring Provider: Referral, Self Treating Provider/Extender: STONE III, HOYT Weeks in Treatment: 29 Diagnosis Coding ICD-10 Codes Code Description [phone] 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d I89.0 Lymphedema, not elsewhere classified I87.311 Chronic venous hypertension (idiopathic) with ulcer of right lower extremity E66.01 Morbid (severe) obesity due to excess calories Facility Procedures CPT4 Code: 28315176 Description: 16073 - WOUND CARE VISIT-LEV 2 EST PT Modifier: Quantity: 1 Physician Procedures CPT4 Code: 7106269 Description: 99213 - WC PHYS LEVEL 3 - EST PT ICD-10 Diagnosis Description L97.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 I89.0 Lymphedema, not elsewhere classified I87.311 Chronic venous hypertension (  idiopathic) with ulcer of  right l E66.01 Morbid (severe) obesity due to excess calories Modifier: d ower extremity Quantity: 1 Electronic Signature(s) Signed: 04/01/2018 4:12:37 PM By: Lenda Kelp PA-C Entered By: Lenda Kelp on 04/01/2018 15:48:40

## 2018-04-08 ENCOUNTER — Encounter: Payer: Self-pay | Admitting: Physician Assistant

## 2018-04-08 ENCOUNTER — Other Ambulatory Visit
Admission: RE | Admit: 2018-04-08 | Discharge: 2018-04-08 | Disposition: A | Discharge status: Home / Self Care | Payer: Self-pay | Source: Ambulatory Visit | Attending: Physician Assistant | Admitting: Physician Assistant

## 2018-04-08 DIAGNOSIS — L97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d: Secondary | ICD-10-CM | POA: Insufficient documentation

## 2018-04-08 DIAGNOSIS — L97221 Non-pressure chronic ulcer of left calf limited to breakdown of skin: Secondary | ICD-10-CM | POA: Insufficient documentation

## 2018-04-10 LAB — AEROBIC CULTURE  (SUPERFICIAL SPECIMEN)

## 2018-04-10 LAB — AEROBIC CULTURE W GRAM STAIN (SUPERFICIAL SPECIMEN)

## 2018-04-10 NOTE — Progress Notes (Signed)
DAJANE, VALLI ([id number]) Visit Report for 04/08/2018 Arrival Information Details Patient Name: [name], [name] Date of Service: 04/08/2018 11:00 AM Medical Record Number: [id number] Patient Account Number: [id number] Date of Birth/Sex: May 04, 1981 (37 y.o. F) Treating RN: [name] Primary Care [name]: PATIENT, NO Other Clinician: Referring [name]: Referral, Self Treating [name]: [name], [name] Weeks in Treatment: 30 Visit Information History Since Last Visit Added or deleted any medications: No Patient Arrived: Ambulatory Any new allergies or adverse reactions: No Arrival Time: 11:01 Had a fall or experienced change in No Accompanied By: self activities of daily living that may affect Transfer Assistance: None risk of falls: Patient Identification Verified: Yes Signs or symptoms of abuse/neglect since last visito No Secondary Verification Process Completed: Yes Hospitalized since last visit: No Patient Requires Transmission-Based No Implantable device outside of the clinic excluding No Precautions: cellular tissue based products placed in the center Patient Has Alerts: No since last visit: Has Dressing in Place as Prescribed: Yes Pain Present Now: No Electronic Signature(s) Signed: 04/08/2018 6:18:07 PM By: [name], BSN, RN, CWS, [name] Entered By: [name], BSN, RN, CWS, Jessica on 04/08/2018 11:02:04 [name] ([id number]) -------------------------------------------------------------------------------- Clinic Level of Care Assessment Details Patient Name: Jessica Casey R. Date of Service: 04/08/2018 11:00 AM Medical Record Number: [id number] Patient Account Number: [id number] Date of Birth/Sex: Jun 28, 1981 (37 y.o. F) Treating RN: [name] Primary Care Krystalynn Ridgeway: PATIENT, NO Other Clinician: Referring [name]: Referral, Self Treating [name]: [name], [name] Weeks in Treatment: 30 Clinic Level of Care Assessment Items TOOL 4 Quantity  Score X - Use when only an EandM is performed on FOLLOW-UP visit 1 0 ASSESSMENTS - Nursing Assessment / Reassessment X - Reassessment of Co-morbidities (includes updates in patient status) 1 10 X- 1 5 Reassessment of Adherence to Treatment Plan ASSESSMENTS - Wound and Skin Assessment / Reassessment []  - Simple Wound Assessment / Reassessment - one wound 0 X- 4 5 Complex Wound Assessment / Reassessment - multiple wounds []  - 0 Dermatologic / Skin Assessment (not related to wound area) ASSESSMENTS - Focused Assessment []  - Circumferential Edema Measurements - multi extremities 0 []  - 0 Nutritional Assessment / Counseling / Intervention []  - 0 Lower Extremity Assessment (monofilament, tuning fork, pulses) []  - 0 Peripheral Arterial Disease Assessment (using hand held doppler) ASSESSMENTS - Ostomy and/or Continence Assessment and Care []  - Incontinence Assessment and Management 0 []  - 0 Ostomy Care Assessment and Management (repouching, etc.) PROCESS - Coordination of Care []  - Simple Patient / Family Education for ongoing care 0 X- 1 20 Complex (extensive) Patient / Family Education for ongoing care []  - 0 Staff obtains Chiropractor, Records, Test Results / Process Orders []  - 0 Staff telephones HHA, Nursing Homes / Clarify orders / etc []  - 0 Routine Transfer to another Facility (non-emergent condition) []  - 0 Routine Hospital Admission (non-emergent condition) []  - 0 New Admissions / Manufacturing engineer / Ordering NPWT, Apligraf, etc. []  - 0 Emergency Hospital Admission (emergent condition) []  - 0 Simple Discharge Coordination [name], [name] ([id number]) X- 1 15 Complex (extensive) Discharge Coordination PROCESS - Special Needs []  - Pediatric / Minor Patient Management 0 []  - 0 Isolation Patient Management []  - 0 Hearing / Language / Visual special needs []  - 0 Assessment of Community assistance (transportation, D/C planning, etc.) []  - 0 Additional assistance /  Altered mentation []  - 0 Support Surface(s) Assessment (bed, cushion, seat, etc.) INTERVENTIONS - Wound Cleansing / Measurement []  - Simple Wound Cleansing - one wound  0 X- 4 5 Complex Wound Cleansing - multiple wounds X- 1 5 Wound Imaging (photographs - any number of wounds)  - 0 Wound Tracing (instead of photographs)  - 0 Simple Wound Measurement - one wound X- 4 5 Complex Wound Measurement - multiple wounds INTERVENTIONS - Wound Dressings X - Small Wound Dressing one or multiple wounds 2 10 X- 2 15 Medium Wound Dressing one or multiple wounds  - 0 Large Wound Dressing one or multiple wounds  - 0 Application of Medications - topical  - 0 Application of Medications - injection INTERVENTIONS - Miscellaneous  - External ear exam 0  - 0 Specimen Collection (cultures, biopsies, blood, body fluids, etc.)  - 0 Specimen(s) / Culture(s) sent or taken to Lab for analysis  - 0 Patient Transfer (multiple staff / Nurse, adult / Similar devices)  - 0 Simple Staple / Suture removal (25 or less)  - 0 Complex Staple / Suture removal (26 or more)  - 0 Hypo / Hyperglycemic Management (close monitor of Blood Glucose)  - 0 Ankle / Brachial Index (ABI) - do not check if billed separately X- 1 5 Vital Signs [name], [name] ([id number]) Has the patient been seen at the hospital within the last three years: Yes Total Score: 170 Level Of Care: New/Established - Level 5 Electronic Signature(s) Signed: 04/08/2018 5:05:05 PM By: [name] Entered By: [name] on 04/08/2018 12:04:46 [name], [name] ([id number]) -------------------------------------------------------------------------------- Lower Extremity Assessment Details Patient Name: Jessica Casey R. Date of Service: 04/08/2018 11:00 AM Medical Record Number: [id number] Patient Account Number: [id number] Date of Birth/Sex: 04/08/1981 (37 y.o. F) Treating RN: [name] Primary Care Calel Pisarski: PATIENT,  NO Other Clinician: Referring Vanessa Kampf: Referral, Self Treating Marnette Perkins/Extender: [name], [name] Weeks in Treatment: 30 Edema Assessment Assessed: [Left: No] [Right: No] [Left: Edema] [Right: :] Calf Left: Right: Point of Measurement: 35 cm From Medial Instep 60 cm 62.5 cm Ankle Left: Right: Point of Measurement: 9 cm From Medial Instep 40 cm 43 cm Vascular Assessment Claudication: Claudication Assessment [Left:None] [Right:None] Pulses: Dorsalis Pedis Palpable: [Left:Yes] [Right:Yes] Posterior Tibial Extremity colors, hair growth, and conditions: Extremity Color: [Left:Hyperpigmented] [Right:Hyperpigmented] Hair Growth on Extremity: [Left:No] [Right:No] Temperature of Extremity: [Left:Warm] [Right:Warm] Capillary Refill: [Left:< 3 seconds] [Right:< 3 seconds] Toe Nail Assessment Left: Right: Thick: Yes Yes Discolored: Yes Yes Deformed: Yes Yes Improper Length and Hygiene: Yes Yes Electronic Signature(s) Signed: 04/08/2018 6:18:07 PM By: [name], BSN, RN, CWS, [name] Entered By: [name], BSN, RN, CWS, Jessica on 04/08/2018 11:21:44 [name] ([id number]) -------------------------------------------------------------------------------- Multi Wound Chart Details Patient Name: Jessica Casey R. Date of Service: 04/08/2018 11:00 AM Medical Record Number: [id number] Patient Account Number: [id number] Date of Birth/Sex: 03-Oct-1981 (37 y.o. F) Treating RN: [name] Primary Care Greenlee Ancheta: PATIENT, NO Other Clinician: Referring Brenlee Koskela: Referral, Self Treating Stephanne Greeley/Extender: [name], [name] Weeks in Treatment: 30 Vital Signs Height(in): 74 Pulse(bpm): 103 Weight(lbs): 505 Blood Pressure(mmHg): 126/85 Body Mass Index(BMI): 65 Temperature(F): 98.2 Respiratory Rate 16 (breaths/min): Photos: [2:No Photos] [3:No Photos] [4:No Photos] Wound Location: [2:Right, Distal, Lateral Lower Leg] [3:Right Lower Leg - Anterior] [4:Right Lower Leg - Lateral,  Distal] Wounding Event: [2:Gradually Appeared] [3:Gradually Appeared] [4:Gradually Appeared] Primary Etiology: [2:Lymphedema] [3:Lymphedema] [4:Lymphedema] Comorbid History: [2:N/A] [3:Lymphedema, Hypertension] [4:Lymphedema, Hypertension] Date Acquired: [2:08/08/2017] [3:04/07/2018] [4:04/07/2018] Weeks of Treatment: [2:30] [3:0] [4:0] Wound Status: [2:Open] [3:Open] [4:Open] Clustered Wound: [2:Yes] [3:No] [4:No] Measurements L x W x D [2:0.6x0.4x0.2] [3:0.8x0.8x0.1] [4:5x3x0.1] (cm) Area (cm) : [2:0.188] [3:0.503] [4:11.781] Volume (cm) : [2:0.038] [3:0.05] [4:1.178] %  Reduction in Area: [2:99.10%] [3:0.00%] [4:0.00%] % Reduction in Volume: [2:99.40%] [3:0.00%] [4:0.00%] Classification: [2:Full Thickness With Exposed Support Structures] [3:Partial Thickness] [4:Partial Thickness] Exudate Amount: [2:N/A] [3:Large] [4:Large] Exudate Type: [2:N/A] [3:Serous] [4:Serous] Exudate Color: [2:N/A] [3:amber] [4:amber] Wound Margin: [2:N/A] [3:Flat and Intact] [4:Flat and Intact] Granulation Amount: [2:N/A] [3:None Present (0%)] [4:None Present (0%)] Granulation Quality: [2:N/A] [3:N/A] [4:N/A] Necrotic Amount: [2:N/A] [3:None Present (0%)] [4:None Present (0%)] Necrotic Tissue: [2:N/A] [3:N/A] [4:N/A] Epithelialization: [2:N/A] [3:Large (67-100%)] [4:Large (67-100%)] Periwound Skin Texture: [2:No Abnormalities Noted] [3:Induration: Yes Excoriation: No Callus: No Crepitus: No Rash: No Scarring: No] [4:Induration: Yes Excoriation: No Callus: No Crepitus: No Rash: No Scarring: No] Periwound Skin Moisture: [2:No Abnormalities Noted] [3:Maceration: No Dry/Scaly: No] [4:Maceration: No Dry/Scaly: No] Periwound Skin Color: [2:No Abnormalities Noted] [3:Atrophie Blanche: No Cyanosis: No] [4:Atrophie Blanche: No Cyanosis: No] Ecchymosis: No Ecchymosis: No Erythema: No Erythema: No Hemosiderin Staining: No Hemosiderin Staining: No Mottled: No Mottled: No Pallor: No Pallor: No Rubor: No Rubor:  No Tenderness on Palpation: No No No Wound Preparation: N/A Ulcer Cleansing: Ulcer Cleansing: Rinsed/Irrigated with Saline Rinsed/Irrigated with Saline Topical Anesthetic Applied: Topical Anesthetic Applied: None, Other None Wound Number: 5 6 N/A Photos: No Photos No Photos N/A Wound Location: Left Lower Leg - Anterior Left Lower Leg N/A Wounding Event: Gradually Appeared Gradually Appeared N/A Primary Etiology: Lymphedema Lymphedema N/A Comorbid History: Lymphedema, Hypertension Lymphedema, Hypertension N/A Date Acquired: 04/07/2018 03/31/2018 N/A Weeks of Treatment: 0 0 N/A Wound Status: Open Open N/A Clustered Wound: No No N/A Measurements L x W x D 2.8x3.5x0.5 1.1x1.2x0.2 N/A (cm) Area (cm) : 7.697 1.037 N/A Volume (cm) : 3.848 0.207 N/A % Reduction in Area: N/A N/A N/A % Reduction in Volume: N/A N/A N/A Classification: Full Thickness Without Full Thickness Without N/A Exposed Support Structures Exposed Support Structures Exudate Amount: Medium Medium N/A Exudate Type: Serosanguineous Serosanguineous N/A Exudate Color: red, brown red, brown N/A Wound Margin: Flat and Intact Flat and Intact N/A Granulation Amount: Small (1-33%) None Present (0%) N/A Granulation Quality: Red, Hyper-granulation [name]: Large (67-100%) Large (67-100%) N/A Necrotic Tissue: Eschar, Adherent Slough Eschar, Adherent Slough N/A Exposed Structures: Fat Layer (Subcutaneous Fat Layer (Subcutaneous N/A Tissue) Exposed: Yes Tissue) Exposed: Yes Fascia: No Fascia: No Tendon: No Tendon: No Muscle: No Muscle: No Joint: No Joint: No Bone: No Bone: No Epithelialization: None None N/A Periwound Skin Texture: No Abnormalities Noted Excoriation: No N/A Induration: No Callus: No Crepitus: No Rash: No Scarring: No Periwound Skin Moisture: No Abnormalities Noted Maceration: No N/A Dry/Scaly: No Periwound Skin Color: No Abnormalities Noted Hemosiderin Staining: Yes N/A Atrophie  Blanche: No Emerick, Everlene R. ([id number]) Cyanosis: No Ecchymosis: No Erythema: No Mottled: No Pallor: No Rubor: No Tenderness on Palpation: No No N/A Wound Preparation: Ulcer Cleansing: Ulcer Cleansing: N/A Rinsed/Irrigated with Saline Rinsed/Irrigated with Saline Topical Anesthetic Applied: Topical Anesthetic Applied: Other: lidocaine 4% Other: lidocaine 4% Treatment Notes Electronic Signature(s) Signed: 04/08/2018 5:05:05 PM By: [name] Entered By: [name] on 04/08/2018 11:56:41 Ruggerio, Kimiyah R. ([id number]) -------------------------------------------------------------------------------- Multi-Disciplinary Care Plan Details Patient Name: Jessica Casey R. Date of Service: 04/08/2018 11:00 AM Medical Record Number: [id number] Patient Account Number: [id number] Date of Birth/Sex: 10/29/1981 (37 y.o. F) Treating RN: [name] Primary Care Brallan Denio: PATIENT, NO Other Clinician: Referring Tawania Daponte: Referral, Self Treating Amias Hutchinson/Extender: [name], [name] Weeks in Treatment: 30 Active Inactive ` Orientation to the Wound Care Program Nursing Diagnoses: Knowledge deficit related to the wound healing center program Goals: Patient/caregiver will verbalize understanding of the Wound  Healing Center Program Date Initiated: 09/10/2017 Target Resolution Date: 11/23/2017 Goal Status: Active Interventions: Provide education on orientation to the wound center Notes: ` Venous Leg Ulcer Nursing Diagnoses: Knowledge deficit related to disease process and management Goals: Patient/caregiver will verbalize understanding of disease process and disease management Date Initiated: 09/10/2017 Target Resolution Date: 11/23/2017 Goal Status: Active Interventions: Assess peripheral edema status every visit. Notes: ` Wound/Skin Impairment Nursing Diagnoses: Impaired tissue integrity Goals: Ulcer/skin breakdown will heal within 14 weeks Date Initiated: 09/10/2017 Target  Resolution Date: 11/24/2017 Goal Status: Active Interventions: AUNESTY, TYSON ([id number]) Assess patient/caregiver ability to obtain necessary supplies Assess patient/caregiver ability to perform ulcer/skin care regimen upon admission and as needed Assess ulceration(s) every visit Notes: Electronic Signature(s) Signed: 04/08/2018 5:05:05 PM By: [name] Entered By: [name] on 04/08/2018 11:56:24 Esty, Terence R. ([id number]) -------------------------------------------------------------------------------- Pain Assessment Details Patient Name: Jessica Casey R. Date of Service: 04/08/2018 11:00 AM Medical Record Number: [id number] Patient Account Number: [id number] Date of Birth/Sex: 1981-01-28 (37 y.o. F) Treating RN: [name] Primary Care Lasheena Frieze: PATIENT, NO Other Clinician: Referring Lurene Robley: Referral, Self Treating Payeton Germani/Extender: [name], [name] Weeks in Treatment: 30 Active Problems Location of Pain Severity and Description of Pain Patient Has Paino No Site Locations With Dressing Change: No Pain Management and Medication Current Pain Management: Goals for Pain Management Topical or injectable lidocaine is offered to patient for acute pain when surgical debridement is performed. If needed, Patient is instructed to use over the counter pain medication for the following 24-48 hours after debridement. Wound care MDs do not prescribed pain medications. Patient has chronic pain or uncontrolled pain. Patient has been instructed to make an appointment with their Primary Care Physician for pain management. Electronic Signature(s) Signed: 04/08/2018 6:18:07 PM By: [name], BSN, RN, CWS, [name] Entered By: [name], BSN, RN, CWS, Jessica on 04/08/2018 11:02:31 [name] ([id number]) -------------------------------------------------------------------------------- Wound Assessment Details Patient Name: Jessica Casey R. Date of Service: 04/08/2018 11:00 AM Medical  Record Number: [id number] Patient Account Number: [id number] Date of Birth/Sex: June 12, 1981 (37 y.o. F) Treating RN: [name] Primary Care Kody Vigil: PATIENT, NO Other Clinician: Referring Evita Merida: Referral, Self Treating Deron Poole/Extender: [name], [name] Weeks in Treatment: 30 Wound Status Wound Number: 2 Primary Etiology: Lymphedema Wound Location: Right, Distal, Lateral Lower Leg Wound Status: Open Wounding Event: Gradually Appeared Date Acquired: 08/08/2017 Weeks Of Treatment: 30 Clustered Wound: Yes Photos Photo Uploaded By: [name], BSN, RN, CWS, Jessica on 04/08/2018 17:28:52 Wound Measurements Length: (cm) 0.6 Width: (cm) 0.4 Depth: (cm) 0.2 Area: (cm) 0.188 Volume: (cm) 0.038 % Reduction in Area: 99.1% % Reduction in Volume: 99.4% Wound Description Full Thickness With Exposed Support Classification: Structures Periwound Skin Texture Texture Color No Abnormalities Noted: No No Abnormalities Noted: No Moisture No Abnormalities Noted: No Electronic Signature(s) Signed: 04/08/2018 6:18:07 PM By: [name], BSN, RN, CWS, [name] Entered By: [name], BSN, RN, CWS, Jessica on 04/08/2018 11:10:42 [name] ([id number]) -------------------------------------------------------------------------------- Wound Assessment Details Patient Name: Jessica Casey R. Date of Service: 04/08/2018 11:00 AM Medical Record Number: [id number] Patient Account Number: [id number] Date of Birth/Sex: 21-Sep-1981 (37 y.o. F) Treating RN: [name] Primary Care Elizabethanne Lusher: PATIENT, NO Other Clinician: Referring Valissa Lyvers: Referral, Self Treating Diamante Truszkowski/Extender: [name], [name] Weeks in Treatment: 30 Wound Status Wound Number: 3 Primary Etiology: Lymphedema Wound Location: Right Lower Leg - Anterior Wound Status: Open Wounding Event: Gradually Appeared Comorbid History: Lymphedema, Hypertension Date Acquired: 04/07/2018 Weeks Of Treatment: 0 Clustered Wound: No Photos Photo Uploaded By:  [name], BSN,  RN, CWS, Jessica on 04/08/2018 17:28:52 Wound Measurements Length: (cm) 0.8 Width: (cm) 0.8 Depth: (cm) 0.1 Area: (cm) 0.503 Volume: (cm) 0.05 % Reduction in Area: 0% % Reduction in Volume: 0% Epithelialization: Large (67-100%) Tunneling: No Undermining: No Wound Description Classification: Partial Thickness Foul Odor Wound Margin: Flat and Intact Slough/Fi Exudate Amount: Large Exudate Type: Serous Exudate Color: amber After Cleansing: No brino No Wound Bed Granulation Amount: None Present (0%) Exposed Structure Necrotic Amount: None Present (0%) Fascia Exposed: No Fat Layer (Subcutaneous Tissue) Exposed: No Tendon Exposed: No Muscle Exposed: No Joint Exposed: No Bone Exposed: No Periwound Skin Texture Mottley, Amaura R. ([id number]) Texture Color No Abnormalities Noted: No No Abnormalities Noted: No Callus: No Atrophie Blanche: No Crepitus: No Cyanosis: No Excoriation: No Ecchymosis: No Induration: Yes Erythema: No Rash: No Hemosiderin Staining: No Scarring: No Mottled: No Pallor: No Moisture Rubor: No No Abnormalities Noted: No Dry / Scaly: No Maceration: No Wound Preparation Ulcer Cleansing: Rinsed/Irrigated with Saline Topical Anesthetic Applied: None, Other Electronic Signature(s) Signed: 04/08/2018 6:18:07 PM By: [name], BSN, RN, CWS, [name] Entered By: [name], BSN, RN, CWS, Jessica on 04/08/2018 11:12:35 [name] ([id number]) -------------------------------------------------------------------------------- Wound Assessment Details Patient Name: Jessica Casey R. Date of Service: 04/08/2018 11:00 AM Medical Record Number: [id number] Patient Account Number: [id number] Date of Birth/Sex: 1981/04/05 (37 y.o. F) Treating RN: [name] Primary Care Yeshua Stryker: PATIENT, NO Other Clinician: Referring Jenniah Bhavsar: Referral, Self Treating Antanasia Kaczynski/Extender: [name], [name] Weeks in Treatment: 30 Wound Status Wound Number: 4 Primary Etiology:  Lymphedema Wound Location: Right Lower Leg - Lateral, Distal Wound Status: Open Wounding Event: Gradually Appeared Comorbid History: Lymphedema, Hypertension Date Acquired: 04/07/2018 Weeks Of Treatment: 0 Clustered Wound: No Photos Photo Uploaded By: [name], BSN, RN, CWS, Jessica on 04/08/2018 17:29:32 Wound Measurements Length: (cm) 5 Width: (cm) 3 Depth: (cm) 0.1 Area: (cm) 11.781 Volume: (cm) 1.178 % Reduction in Area: 0% % Reduction in Volume: 0% Epithelialization: Large (67-100%) Tunneling: No Undermining: No Wound Description Classification: Partial Thickness Wound Margin: Flat and Intact Exudate Amount: Large Exudate Type: Serous Exudate Color: amber Foul Odor After Cleansing: No Slough/Fibrino No Wound Bed Granulation Amount: None Present (0%) Necrotic Amount: None Present (0%) Periwound Skin Texture Texture Color No Abnormalities Noted: No No Abnormalities Noted: No Callus: No Atrophie Blanche: No Crepitus: No Cyanosis: No Excoriation: No Ecchymosis: No Gruenberg, Nakira R. ([id number]) Induration: Yes Erythema: No Rash: No Hemosiderin Staining: No Scarring: No Mottled: No Pallor: No Moisture Rubor: No No Abnormalities Noted: No Dry / Scaly: No Maceration: No Wound Preparation Ulcer Cleansing: Rinsed/Irrigated with Saline Topical Anesthetic Applied: None Electronic Signature(s) Signed: 04/08/2018 6:18:07 PM By: [name], BSN, RN, CWS, [name] Entered By: [name], BSN, RN, CWS, Jessica on 04/08/2018 11:14:31 [name] ([id number]) -------------------------------------------------------------------------------- Wound Assessment Details Patient Name: Jessica Casey R. Date of Service: 04/08/2018 11:00 AM Medical Record Number: [id number] Patient Account Number: [id number] Date of Birth/Sex: 07-29-1981 (37 y.o. F) Treating RN: [name] Primary Care [place] Oborn: PATIENT, NO Other Clinician: Referring Serina Nichter: Referral, Self Treating Naomii Kreger/Extender:  [name], [name] Weeks in Treatment: 30 Wound Status Wound Number: 5 Primary Etiology: Lymphedema Wound Location: Left Lower Leg - Anterior Wound Status: Open Wounding Event: Gradually Appeared Comorbid History: Lymphedema, Hypertension Date Acquired: 04/07/2018 Weeks Of Treatment: 0 Clustered Wound: No Photos Photo Uploaded By: [name], BSN, RN, CWS, Jessica on 04/08/2018 17:29:32 Wound Measurements Length: (cm) 2.8 Width: (cm) 3.5 Depth: (cm) 0.5 Area: (cm) 7.697 Volume: (cm) 3.848 % Reduction in Area: % Reduction in  Volume: Epithelialization: None Tunneling: No Undermining: No Wound Description Full Thickness Without Exposed Support Classification: Structures Wound Margin: Flat and Intact Exudate Medium Amount: Exudate Type: Serosanguineous Exudate Color: red, brown Foul Odor After Cleansing: No Slough/Fibrino Yes Wound Bed Granulation Amount: Small (1-33%) Exposed Structure Granulation Quality: Red, Hyper-granulation Fascia Exposed: No Necrotic Amount: Large (67-100%) Fat Layer (Subcutaneous Tissue) Exposed: Yes Necrotic Quality: Eschar, Adherent Slough Tendon Exposed: No Muscle Exposed: No Joint Exposed: No Bone Exposed: No Wickware, Doloros R. ([id number]) Periwound Skin Texture Texture Color No Abnormalities Noted: No No Abnormalities Noted: No Moisture No Abnormalities Noted: No Wound Preparation Ulcer Cleansing: Rinsed/Irrigated with Saline Topical Anesthetic Applied: Other: lidocaine 4%, Electronic Signature(s) Signed: 04/08/2018 6:18:07 PM By: [name], BSN, RN, CWS, [name] Entered By: [name], BSN, RN, CWS, Jessica on 04/08/2018 11:17:41 [name] ([id number]) -------------------------------------------------------------------------------- Wound Assessment Details Patient Name: Jessica Casey R. Date of Service: 04/08/2018 11:00 AM Medical Record Number: [id number] Patient Account Number: [id number] Date of Birth/Sex: 02-28-1981 (37 y.o. F) Treating  RN: [name] Primary Care Rabecca Birge: PATIENT, NO Other Clinician: Referring Kanoa Phillippi: Referral, Self Treating Johngabriel Verde/Extender: [name], [name] Weeks in Treatment: 30 Wound Status Wound Number: 6 Primary Etiology: Lymphedema Wound Location: Left Lower Leg Wound Status: Open Wounding Event: Gradually Appeared Comorbid History: Lymphedema, Hypertension Date Acquired: 03/31/2018 Weeks Of Treatment: 0 Clustered Wound: No Photos Photo Uploaded By: [name], BSN, RN, CWS, Jessica on 04/08/2018 17:29:48 Wound Measurements Length: (cm) 1.1 Width: (cm) 1.2 Depth: (cm) 0.2 Area: (cm) 1.037 Volume: (cm) 0.207 % Reduction in Area: % Reduction in Volume: Epithelialization: None Tunneling: No Undermining: No Wound Description Full Thickness Without Exposed Support Classification: Structures Wound Margin: Flat and Intact Exudate Medium Amount: Exudate Type: Serosanguineous Exudate Color: red, brown Foul Odor After Cleansing: No Slough/Fibrino Yes Wound Bed Granulation Amount: None Present (0%) Exposed Structure Necrotic Amount: Large (67-100%) Fascia Exposed: No Necrotic Quality: Eschar, Adherent Slough Fat Layer (Subcutaneous Tissue) Exposed: Yes Tendon Exposed: No Muscle Exposed: No Joint Exposed: No Bone Exposed: No Regan, Aubrey R. ([id number]) Periwound Skin Texture Texture Color No Abnormalities Noted: No No Abnormalities Noted: No Callus: No Atrophie Blanche: No Crepitus: No Cyanosis: No Excoriation: No Ecchymosis: No Induration: No Erythema: No Rash: No Hemosiderin Staining: Yes Scarring: No Mottled: No Pallor: No Moisture Rubor: No No Abnormalities Noted: No Dry / Scaly: No Maceration: No Wound Preparation Ulcer Cleansing: Rinsed/Irrigated with Saline Topical Anesthetic Applied: Other: lidocaine 4%, Electronic Signature(s) Signed: 04/08/2018 6:18:07 PM By: [name], BSN, RN, CWS, [name] Entered By: [name], BSN, RN, CWS, Jessica on 04/08/2018  11:19:40 [name] ([id number]) -------------------------------------------------------------------------------- Vitals Details Patient Name: Jessica Casey R. Date of Service: 04/08/2018 11:00 AM Medical Record Number: [id number] Patient Account Number: [id number] Date of Birth/Sex: 05/22/1981 (37 y.o. F) Treating RN: [name] Primary Care Inara Dike: PATIENT, NO Other Clinician: Referring Lizabeth Fellner: Referral, Self Treating Nilda Keathley/Extender: [name], [name] Weeks in Treatment: 30 Vital Signs Time Taken: 11:02 Temperature (F): 98.2 Height (in): 74 Pulse (bpm): 103 Weight (lbs): 505 Respiratory Rate (breaths/min): 16 Body Mass Index (BMI): 64.8 Blood Pressure (mmHg): 126/85 Reference Range: 80 - 120 mg / dl Electronic Signature(s) Signed: 04/08/2018 6:18:07 PM By: [name], BSN, RN, CWS, [name] Entered By: [name], BSN, RN, CWS, Jessica on 04/08/2018 11:05:19

## 2018-04-11 ENCOUNTER — Encounter (HOSPITAL_COMMUNITY): Payer: Self-pay | Admitting: Emergency Medicine

## 2018-04-11 ENCOUNTER — Emergency Department (HOSPITAL_COMMUNITY): Payer: Self-pay

## 2018-04-11 ENCOUNTER — Inpatient Hospital Stay (HOSPITAL_COMMUNITY)
Admission: EM | Admit: 2018-04-11 | Discharge: 2018-04-13 | DRG: 872 | Disposition: A | Discharge status: Home / Self Care | Payer: Self-pay | Attending: Internal Medicine | Admitting: Internal Medicine

## 2018-04-11 DIAGNOSIS — L03115 Cellulitis of right lower limb: Secondary | ICD-10-CM | POA: Diagnosis present

## 2018-04-11 DIAGNOSIS — L97929 Non-pressure chronic ulcer of unspecified part of left lower leg with unspecified severity: Secondary | ICD-10-CM | POA: Diagnosis present

## 2018-04-11 DIAGNOSIS — A419 Sepsis, unspecified organism: Principal | ICD-10-CM | POA: Diagnosis present

## 2018-04-11 DIAGNOSIS — L97919 Non-pressure chronic ulcer of unspecified part of right lower leg with unspecified severity: Secondary | ICD-10-CM | POA: Diagnosis present

## 2018-04-11 DIAGNOSIS — Z6841 Body Mass Index (BMI) 40.0 and over, adult: Secondary | ICD-10-CM

## 2018-04-11 DIAGNOSIS — I890 Lymphedema, not elsewhere classified: Secondary | ICD-10-CM

## 2018-04-11 DIAGNOSIS — L03119 Cellulitis of unspecified part of limb: Secondary | ICD-10-CM

## 2018-04-11 DIAGNOSIS — L03116 Cellulitis of left lower limb: Secondary | ICD-10-CM | POA: Diagnosis present

## 2018-04-11 DIAGNOSIS — L0390 Cellulitis, unspecified: Secondary | ICD-10-CM | POA: Diagnosis present

## 2018-04-11 DIAGNOSIS — I10 Essential (primary) hypertension: Secondary | ICD-10-CM | POA: Diagnosis present

## 2018-04-11 LAB — COMPREHENSIVE METABOLIC PANEL
ALT: 13 U/L — ABNORMAL LOW (ref 14–54)
AST: 18 U/L (ref 15–41)
Albumin: 3.9 g/dL (ref 3.5–5.0)
Alkaline Phosphatase: 51 U/L (ref 38–126)
Anion gap: 10 (ref 5–15)
BUN: 12 mg/dL (ref 6–20)
CO2: 23 mmol/L (ref 22–32)
Calcium: 9.1 mg/dL (ref 8.9–10.3)
Chloride: 103 mmol/L (ref 101–111)
Creatinine, Ser: 1.07 mg/dL — ABNORMAL HIGH (ref 0.44–1.00)
GFR calc Af Amer: >60 mL/min (ref >60)
GFR calc non Af Amer: >60 mL/min (ref >60)
Glucose, Bld: 112 mg/dL — ABNORMAL HIGH (ref 65–99)
Potassium: 4 mmol/L (ref 3.5–5.1)
Sodium: 136 mmol/L (ref 135–145)
Total Bilirubin: 1.2 mg/dL (ref 0.3–1.2)
Total Protein: 8 g/dL (ref 6.5–8.1)

## 2018-04-11 LAB — CBC WITH DIFFERENTIAL/PLATELET
Basophils Absolute: 0 10*3/uL (ref 0.0–0.1)
Basophils Relative: 0 %
Eosinophils Absolute: 0 10*3/uL (ref 0.0–0.7)
Eosinophils Relative: 0 %
HCT: 39.2 % (ref 36.0–46.0)
Hemoglobin: 13 g/dL (ref 12.0–15.0)
Lymphocytes Relative: 10 %
Lymphs Abs: 1.5 10*3/uL (ref 0.7–4.0)
MCH: 32.4 pg (ref 26.0–34.0)
MCHC: 33.2 g/dL (ref 30.0–36.0)
MCV: 97.8 fL (ref 78.0–100.0)
Monocytes Absolute: 1.1 10*3/uL — ABNORMAL HIGH (ref 0.1–1.0)
Monocytes Relative: 7 %
Neutro Abs: 13 10*3/uL — ABNORMAL HIGH (ref 1.7–7.7)
Neutrophils Relative %: 83 %
Platelets: 254 10*3/uL (ref 150–400)
RBC: 4.01 MIL/uL (ref 3.87–5.11)
RDW: 12.6 % (ref 11.5–15.5)
WBC: 15.6 10*3/uL — ABNORMAL HIGH (ref 4.0–10.5)

## 2018-04-11 LAB — I-STAT CG4 LACTIC ACID, ED: Lactic Acid, Venous: 1.12 mmol/L (ref 0.5–1.9)

## 2018-04-11 LAB — I-STAT BETA HCG BLOOD, ED (MC, WL, AP ONLY): I-stat hCG, quantitative: <5 m[IU]/mL (ref <5)

## 2018-04-11 LAB — PROTIME-INR
INR: 1.09
Prothrombin Time: 14 seconds (ref 11.4–15.2)

## 2018-04-11 MED ORDER — PIPERACILLIN-TAZOBACTAM 3.375 G IVPB
3.3750 g | Freq: Three times a day (TID) | INTRAVENOUS | Status: DC
  Administered 2018-04-12 (×2): 3.375 g via INTRAVENOUS
  Filled 2018-04-11 (×2): qty 50

## 2018-04-11 MED ORDER — VANCOMYCIN HCL IN DEXTROSE 1-5 GM/200ML-% IV SOLN
1000.0000 mg | Freq: Once | INTRAVENOUS | Status: DC
  Filled 2018-04-11 (×3): qty 200

## 2018-04-11 MED ORDER — VANCOMYCIN HCL 10 G IV SOLR
1750.0000 mg | Freq: Two times a day (BID) | INTRAVENOUS | Status: DC
  Administered 2018-04-12 – 2018-04-13 (×3): 1750 mg via INTRAVENOUS
  Filled 2018-04-11 (×4): qty 1750

## 2018-04-11 MED ORDER — ACETAMINOPHEN 500 MG PO TABS
1000.0000 mg | ORAL_TABLET | Freq: Once | ORAL | Status: AC
  Administered 2018-04-11: 1000 mg via ORAL
  Filled 2018-04-11: qty 2

## 2018-04-11 MED ORDER — ACETAMINOPHEN 325 MG PO TABS: 650.0000 mg | ORAL_TABLET | Freq: Once | ORAL | Status: DC

## 2018-04-11 MED ORDER — SODIUM CHLORIDE 0.9 % IV BOLUS
1000.0000 mL | Freq: Once | INTRAVENOUS | Status: AC
  Administered 2018-04-11: 1000 mL via INTRAVENOUS

## 2018-04-11 MED ORDER — VANCOMYCIN HCL 10 G IV SOLR
1500.0000 mg | Freq: Once | INTRAVENOUS | Status: AC
  Administered 2018-04-11: 1500 mg via INTRAVENOUS
  Filled 2018-04-11: qty 1500

## 2018-04-11 MED ORDER — PIPERACILLIN-TAZOBACTAM 3.375 G IVPB 30 MIN
3.3750 g | Freq: Once | INTRAVENOUS | Status: AC
  Administered 2018-04-11: 3.375 g via INTRAVENOUS
  Filled 2018-04-11: qty 50

## 2018-04-11 MED ORDER — DIPHENHYDRAMINE HCL 25 MG PO CAPS
25.0000 mg | ORAL_CAPSULE | Freq: Once | ORAL | Status: AC
  Administered 2018-04-11: 25 mg via ORAL
  Filled 2018-04-11: qty 1

## 2018-04-11 MED ORDER — LIDOCAINE HCL (PF) 1 % IJ SOLN
10.0000 mL | Freq: Once | INTRAMUSCULAR | Status: AC
  Administered 2018-04-11: 10 mL
  Filled 2018-04-11: qty 30

## 2018-04-11 NOTE — ED Triage Notes (Signed)
Pt reports she started on Sulfa antibiotic on Tuesday for her infection on her lower legs. Reports that she had chills and fever since yesterday. Pt took Ibuprofen  around 545pm for her fever.

## 2018-04-11 NOTE — H&P (Addendum)
History and Physical    [name] DOB: December 07, 1980 DOA: 04/11/2018  PCP: Patient, No Pcp Per   Patient coming from: Home   Chief Complaint: Bilat lower extremity pain,ulcers, fever  HPI: [name] is a 37 y.o. female with medical history significant for morbid obesity, lymphedema lateral lower extremities, HTN, who presented to the ED with complaints of bilateral lower extremity pain worse in the right, and fever-recorded temperature up to 104, that started today.  Patient has chronic bilateral lower extremity ulcers.  She follows with the wound care clinic.  New ulcers were noted on the left lower extremity in addition to the old ones on the right, after the wrappings on her legs were removed, when she followed up at the wound care clinic 04/08/18.  There was a concern for cellulitis, so patient was prescribed Bactrim, which endorses compliance with over the past 2 days, taking twice daily as prescribed.  But with fevers and pain this morning she presented to the ED.  She endorses nausea without vomiting.  No dysuria frequency.  No loose stools no shortness of breath no cough. With Fever of 104 today, patient took ibuprofen prior to presentation to the ED  ED Course: Temperature 100.5, tachycardic to 135 initially, blood pressure systolic 102- 123.  Elevated WBC 15.6, Cr- 1.  Lactic acid 1.12.  Bilateral lower extremity x-ray-diffuse soft tissue swelling, negative for acute abnormality.  Two-view chest x-ray negative for acute abnormality.  Blood cultures were drawn.  I&D was attempted in the ED with small of purulent drainage expressed, culture was obtained. Pt was started on IV Vanco and Zosyn in the ED.   Review of Systems: As per HPI otherwise 10 point review of systems negative.   Past Medical History:  Diagnosis Date  . Hypertension    "just today" (08/30/2017)  . Lymphedema of both lower extremities   . Morbid obesity (HCC)     Past Surgical History:  Procedure  Laterality Date  . NO PAST SURGERIES       reports that she has never smoked. She has never used smokeless tobacco. She reports that she does not drink alcohol or use drugs.  Allergies  Allergen Reactions  . Bactrim [Sulfamethoxazole-Trimethoprim] Itching  . Sulfa Antibiotics Itching    Family History  Problem Relation Age of Onset  . Renal Disease Mother     Prior to Admission medications   Medication Sig Start Date End Date Taking? Authorizing Provider  ibuprofen (ADVIL,MOTRIN) 200 MG tablet Take 600 mg by mouth every 6 (six) hours as needed for moderate pain.    Yes [provider]  sulfamethoxazole-trimethoprim (BACTRIM DS,SEPTRA DS) 800-160 MG tablet Take 1 tablet by mouth 2 (two) times daily.   Yes [provider]    Physical Exam: Vitals:   04/11/18 2030 04/11/18 2130 04/11/18 2200 04/11/18 2235  BP: 111/65 109/61 117/73 102/66  Pulse: (!) 112 100 92 (!) 101  Resp:  (!) 28 20 (!) 22  Temp:    98.4 F (36.9 C)  TempSrc:    Oral  SpO2: 96% (!) 88%  94%    Constitutional:  calm, comfortable, Morbidly obese Vitals:   04/11/18 2030 04/11/18 2130 04/11/18 2200 04/11/18 2235  BP: 111/65 109/61 117/73 102/66  Pulse: (!) 112 100 92 (!) 101  Resp:  (!) 28 20 (!) 22  Temp:    98.4 F (36.9 C)  TempSrc:    Oral  SpO2: 96% (!) 88%  94%  Eyes: PERRL, lids and conjunctivae normal ENMT: Mucous membranes are moist. Posterior pharynx clear of any exudate or lesions.Normal dentition.  Neck: normal, supple, no masses, no thyromegaly Respiratory: clear to auscultation bilaterally, no wheezing, no crackles. Normal respiratory effort. No accessory muscle use.  Cardiovascular: Mild tachycardia but regular rate and rhythm, no murmurs / rubs / gallops. Abdomen: obese, no tenderness, no masses palpated. No hepatosplenomegaly. Bowel sounds positive.  Musculoskeletal: no clubbing / cyanosis. No joint deformity upper and lower extremities. Good ROM, no contractures.  Normal muscle tone.  Bilateral lower extremity lymphedema with woody induration, hyperpigmentation suggestive of chronic venous stasis Skin: Chronic ulcers bilateral lower extremities, as seen below,  Neurologic: CN 2-12 grossly intact.  Strength 5/5 in all 4.  Psychiatric: Normal judgment and insight. Alert and oriented x 3. Normal mood.                Labs on Admission: I have personally reviewed following labs and imaging studies  CBC: Recent Labs  Lab 04/11/18 1850  WBC 15.6*  NEUTROABS 13.0*  HGB 13.0  HCT 39.2  MCV 97.8  PLT 254   Basic Metabolic Panel: Recent Labs  Lab 04/11/18 1850  NA 136  K 4.0  CL 103  CO2 23  GLUCOSE 112*  BUN 12  CREATININE 1.07*  CALCIUM 9.1   GFR: CrCl cannot be calculated (Unknown ideal weight.). Liver Function Tests: Recent Labs  Lab 04/11/18 1850  AST 18  ALT 13*  ALKPHOS 51  BILITOT 1.2  PROT 8.0  ALBUMIN 3.9   Coagulation Profile: Recent Labs  Lab 04/11/18 1850  INR 1.09   Urine analysis:    Component Value Date/Time   COLORURINE AMBER (A) 08/30/2017 1948   APPEARANCEUR HAZY (A) 08/30/2017 1948   LABSPEC 1.041 (H) 08/30/2017 1948   PHURINE 5.0 08/30/2017 1948   GLUCOSEU NEGATIVE 08/30/2017 1948   HGBUR MODERATE (A) 08/30/2017 1948   BILIRUBINUR NEGATIVE 08/30/2017 1948   KETONESUR NEGATIVE 08/30/2017 1948   PROTEINUR 100 (A) 08/30/2017 1948   UROBILINOGEN 0.2 09/01/2015 0912   NITRITE NEGATIVE 08/30/2017 1948   LEUKOCYTESUR TRACE (A) 08/30/2017 1948    Radiological Exams on Admission: Dg Chest 2 View  Result Date: 04/11/2018 CLINICAL DATA:  37 year old female with fever. EXAM: CHEST - 2 VIEW COMPARISON:  08/30/2017 and prior chest radiographs FINDINGS: The cardiomediastinal silhouette is unremarkable. Mild peribronchial thickening is unchanged. There is no evidence of focal airspace disease, pulmonary edema, suspicious pulmonary nodule/mass, pleural effusion, or pneumothorax. No acute bony  abnormalities are identified. IMPRESSION: No active cardiopulmonary disease. Electronically Signed   By: Harmon Pier M.D.   On: 04/11/2018 19:19   Dg Tibia/fibula Left  Result Date: 04/11/2018 CLINICAL DATA:  Bilateral LOWER extremity cellulitis. EXAM: LEFT TIBIA AND FIBULA - 2 VIEW COMPARISON:  None. FINDINGS: Diffuse soft tissue swelling noted without gas or radiopaque foreign body. No acute fracture, subluxation, dislocation or osteomyelitis noted. No focal bony lesions are present. IMPRESSION: Diffuse soft tissue swelling without acute bony abnormality. Electronically Signed   By: Harmon Pier M.D.   On: 04/11/2018 20:37   Dg Tibia/fibula Right  Result Date: 04/11/2018 CLINICAL DATA:  RIGHT LOWER extremity cellulitis. Initial encounter. EXAM: RIGHT TIBIA AND FIBULA - 2 VIEW COMPARISON:  None. FINDINGS: Diffuse soft tissue swelling noted without gas or radiopaque foreign body. No acute fracture, subluxation, dislocation or osteomyelitis identified. No focal bony lesions are present. IMPRESSION: Soft tissue swelling without acute bony abnormality. Electronically Signed   By: Tinnie Gens  Hu M.D.   On: 04/11/2018 20:38    EKG: Independently reviewed.  Sinus tach.  No significant change from prior.  Normal intervals.  Assessment/Plan Active Problems:   HTN (hypertension)   Lymphedema of lower extremity   Morbid obesity with BMI of 50.0-59.9, adult (HCC)   Cellulitis  Bilat LE Cellulitis- Pain, fever- 100.5 in ED, acute and chronic wounds. WBC- 15.9. Tachycardia. Lactic acid- 1.12. Patient rules in for sepsis.  Bilateral lower extremity x-ray diffuse soft tissue swelling, negative for acute abnormality.  X-ray clear. 1L bolus given in ED.  Follows with [facility] wound care clinic, wound cultures 04/08/18- MRSA.  -Continue IV Vanco and Zosyn per pharmacy started in ED -F/u Blood cultures drawn in ED -Follow-up cultures from I&D obtained in ED - CBC, BMP a.m -Continue IV fluids normal saline  100cc/hr x 12 hrs - tylenol -Wound care consult chronic stasis ulcers  Morbid obesity BMI 51 per weight 2018.  HTN-soft blood pressures.  Home blood pressure medications. - hydrate   DVT prophylaxis: Lovenox Code Status: Full Family Communication: None at bedside Disposition Plan: Per rounding team Consults called: None  Admission status: Inpt, tele   Onnie Boer MD [facility] Pager [phone] From 6PM-2AM.  Otherwise please contact night-coverage [email or web address] Password [facility]  04/11/2018, 11:23 PM

## 2018-04-11 NOTE — Progress Notes (Signed)
AMIL, MOSEMAN ([id number]) Visit Report for 04/08/2018 Chief Complaint Document Details Patient Name: [name], [name] Date of Service: 04/08/2018 11:00 AM Medical Record Number: [id number] Patient Account Number: [id number] Date of Birth/Sex: Dec 07, 1980 (37 y.o. F) Treating RN: Renne Crigler Primary Care Provider: PATIENT, NO Other Clinician: Referring Provider: Referral, Self Treating Provider/Extender: STONE III, HOYT Weeks in Treatment: 30 Information Obtained from: Patient Chief Complaint She is here in follow up evaluation of right lower extremity ulcers Electronic Signature(s) Signed: 04/09/2018 4:45:04 PM By: Lenda Kelp PA-C Entered By: Lenda Kelp on 04/08/2018 11:24:14 [name], [name] ([id number]) -------------------------------------------------------------------------------- HPI Details Patient Name: [name] Date of Service: 04/08/2018 11:00 AM Medical Record Number: [id number] Patient Account Number: [id number] Date of Birth/Sex: 1981-07-23 (37 y.o. F) Treating RN: Renne Crigler Primary Care Provider: PATIENT, NO Other Clinician: Referring Provider: Referral, Self Treating Provider/Extender: STONE III, HOYT Weeks in Treatment: 30 History of Present Illness HPI Description: 37 year old patient well known to our [facility] wound care clinic where she has been seen since 2016 for bilateral lower extremity venous insufficiency disease with lymphedema and multiple ulcerations associated with morbid obesity. she had custom-made compression stockings and lymphedema pumps which were used in the past. most recently she was admitted to the hospital between October 11 and 09/02/2017 with sepsis, lower extremity wounds and lymphedema.she was initially treated in the outpatient with Keflex and Bactrim. she was initially treated in the hospital with vancomycin and Zosyn and changed over to Unasyn until her white count improved and her blood cultures were negative  for 3 days. After her inpatient management she was discharged home on Augmentin to end on 09/13/2017 with a 14 day course. she has had outpatient vascular duplex scans completed in November 2017 and her right ABI was 1.1 and the left ABI is 1.3. she had normal toe brachial indices bilaterally.she had three-vessel runoff in the right lower extremity and two-vessel runoff in the left lower extremity. On questioning the patient she does have custom made compression stockings and also has a lymphedema pump but has not been using it appropriately and has not been taking good care of herself. 09/17/2017 -- she returns today with compression stockings on the left side and the right side has had significant amount of drainage and has a very strong odor 09/24/2017 -- the drainage is increased significantly and she has more lymphedema and a very strong odor to her wound. Though she does not have systemic symptoms, or overt infection I believe she will benefit from some doxycycline given empirically. 10/01/2017 -- after starting the doxycycline and changing the dressing twice a week her symptoms and signs have definitely improved overall. 10/08/2017 -- she has completed her course of doxycycline but continues to have a lot of drainage and needs twice a week dressing changes. 11/08/17-she is here in follow-up evaluation for right lower extremity ulcers. She admits to using her lymphedema pumps twice daily, one hour per session. she is voicing no complaints or concerns, no signs of infection will change to [facility] 12/14/17 on evaluation today patient appears to be doing very well in regard to her wounds. She has been tolerating the dressing changes she continues to develop some portly the adherent granular tissue on the surface of the wound with some Slough. Obviously we are trying to get too much better wound bed. With that being said the hyper granulation the Hydrofera Blue Dressing to have helped with  which is excellent news. However I think it may be time  to try something a little bit different at this point. 01/11/18 on evaluation today patient appears to be doing fairly well in regard to her right lateral lower extremity ulcers. This shows excellent signs of filling in which is great news. There does not appear to be any evidence of infection which is also good news. She does continue to work as well is good school. She is having no pain. 01/22/18 on evaluation today patient appears to be doing a little bit worse in my opinion in regard to the overall quality of that granulation on her right lower extremity. She was not here last week due to being sick with a stomach virus this may have something to do with the fact that her wound appears to be a little bit worse. With that being said I'm also thinking that after switching from the [facility] Dressing to the silver collagen would really has not looked that's good in my opinion. We may want to swit 02/11/18 on evaluation today patient's right lower/lateral lower extremity ulcers appear to be doing very well at this point. Carbin, Janeshia R. ([id number]) Especially the more proximal ulcer has filled in much closer to surface which is good news. Nonetheless both show signs of improvement which is great news. There does not appear to be any evidence of infection which is also good news. In general patient has been doing well tolerating the wraps as well as the [employer]. 02/18/18 on evaluation today patient appears to be doing a little bit worse in regard to the periwound region the wounds themselves do not look much deteriorated to me. With that being said she has several small blisters/pustules noted in the periwound and there was a significant amount of drainage and maceration compared to previous. There has been a time that we had to bring her back for twice a week dressing changes as far as her wrap was concerned it has been a while  since we've done that however. With that being said the patient has been having some burning and in general I'm concerned about the possibility of infection. She has previously taken doxycycline with good result. Fortunately there does not appear to be any evidence of overall worsening in regard to the size of the wound and in fact the upper wound actually appears to be showing signs of good epithelialization. 03/18/18 on evaluation today patient appears to be doing excellent in regard to her right lateral lower extremity ulcer. She has been tolerating the dressing changes without complication. Fortunately this seems to be making great progress. Overall I see no signs of infection and there is dramatic improvement overall even compared to last week. 03/28/18 on evaluation today patient appears to be doing very well in regard to her right lateral lower surety ulcer. She has been tolerating the dressing changes without complication at this point. She states currently that she's having no significant discomfort which is excellent as well. Overall I'm pleased with how things seem to be progressing. 04/01/18 on evaluation today patient appears to be doing excellent in regard to her right lateral lower extremity ulcers. She has been tolerating the dressing changes without complication which is good news. With that being said the wraps still continues to show signs of helping with her fluid she does have Juxta-Lite compression stockings for when we are done with the current treatment regimen once everything heals. Mainly she just has the one area still remaining the smaller of the two wings is pretty much closed  at this point there's just a very slight opening noted. 04/08/18 on evaluation today patient appears to be doing better in regard to the original wound on the right lateral lower extremity that we have been managing. Unfortunately she has a new area of weeping more anterior on the right lateral  lower extremity and on the left lower extremity she has two new ulcers there appears to be some cellulitis noted at this time. I am concerned about the fact that this may in fact be an infection that has caused the worsening and swelling in the past this has been the case when we previously attempted to determine what was going on when she had down slides like this. With that being said the patient is seeming to tolerate the wraps fairly well for the most part. Electronic Signature(s) Signed: 04/09/2018 4:45:04 PM By: Lenda Kelp PA-C Entered By: Lenda Kelp on 04/09/2018 07:56:15 Miklas, Cristal Deer ([id number]) -------------------------------------------------------------------------------- Physical Exam Details Patient Name: Jessica Casey R. Date of Service: 04/08/2018 11:00 AM Medical Record Number: [id number] Patient Account Number: [id number] Date of Birth/Sex: Jul 21, 1981 (37 y.o. F) Treating RN: Renne Crigler Primary Care Provider: PATIENT, NO Other Clinician: Referring Provider: Referral, Self Treating Provider/Extender: STONE III, HOYT Weeks in Treatment: 30 Constitutional Well-nourished and well-hydrated in no acute distress. Respiratory normal breathing without difficulty. clear to auscultation bilaterally. Cardiovascular regular rate and rhythm with normal S1, S2. 3+ pitting edema of the bilateral lower extremities. Psychiatric this patient is able to make decisions and demonstrates good insight into disease process. Alert and Oriented x 3. pleasant and cooperative. Notes Patient did have some Slough noted on the surface of the wound which was in some cases easily removed with saline and gauze although on the left lower extremity these forms to have [facility] noted that I did not sharply debride due to infection and pain although it did need it. We're gonna see how things do with the dressings over the next week and we will initiate treatment with antibiotics as  well. Electronic Signature(s) Signed: 04/09/2018 4:45:04 PM By: Lenda Kelp PA-C Entered By: Lenda Kelp on 04/09/2018 07:57:11 [name], [name] Kitchen ([id number]) -------------------------------------------------------------------------------- Physician Orders Details Patient Name: Jessica Casey R. Date of Service: 04/08/2018 11:00 AM Medical Record Number: [id number] Patient Account Number: [id number] Date of Birth/Sex: 07-23-1981 (37 y.o. F) Treating RN: Renne Crigler Primary Care Provider: PATIENT, NO Other Clinician: Referring Provider: Referral, Self Treating Provider/Extender: STONE III, HOYT Weeks in Treatment: 30 Verbal / Phone Orders: No Diagnosis Coding ICD-10 Coding Code Description [phone] 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d I89.0 Lymphedema, not elsewhere classified I87.311 Chronic venous hypertension (idiopathic) with ulcer of right lower extremity E66.01 Morbid (severe) obesity due to excess calories Wound Cleansing Wound #2 Right,Distal,Lateral Lower Leg o Clean wound with Normal Saline. o Cleanse wound with mild soap and water Wound #3 Right,Anterior Lower Leg o Clean wound with Normal Saline. o Cleanse wound with mild soap and water Wound #4 Right,Distal,Lateral Lower Leg o Clean wound with Normal Saline. o Cleanse wound with mild soap and water Wound #5 Left,Anterior Lower Leg o Clean wound with Normal Saline. o Cleanse wound with mild soap and water Wound #6 Left Lower Leg o Clean wound with Normal Saline. o Cleanse wound with mild soap and water Anesthetic (add to Medication List) Wound #2 Right,Distal,Lateral Lower Leg o Topical Lidocaine 4% cream applied to wound bed prior to debridement (In Clinic Only). Wound #3 Right,Anterior Lower Leg o Topical  Lidocaine 4% cream applied to wound bed prior to debridement (In Clinic Only). Wound #4 Right,Distal,Lateral Lower Leg o Topical Lidocaine 4% cream  applied to wound bed prior to debridement (In Clinic Only). Wound #5 Left,Anterior Lower Leg o Topical Lidocaine 4% cream applied to wound bed prior to debridement (In Clinic Only). Wound #6 Left Lower Leg o Topical Lidocaine 4% cream applied to wound bed prior to debridement (In Clinic Only). [name], [name] ([id number]) Skin Barriers/Peri-Wound Care Wound #2 Right,Distal,Lateral Lower Leg o Moisturizing lotion Wound #3 Right,Anterior Lower Leg o Moisturizing lotion Wound #4 Right,Distal,Lateral Lower Leg o Moisturizing lotion Wound #5 Left,Anterior Lower Leg o Moisturizing lotion Wound #6 Left Lower Leg o Moisturizing lotion Primary Wound Dressing Wound #2 Right,Distal,Lateral Lower Leg o Hydrafera Blue Ready Transfer Wound #3 Right,Anterior Lower Leg o Hydrafera Blue Ready Transfer Wound #4 Right,Distal,Lateral Lower Leg o Hydrafera Blue Ready Transfer Wound #5 Left,Anterior Lower Leg o Hydrafera Blue Ready Transfer Wound #6 Left Lower Leg o Hydrafera Blue Ready Transfer Secondary Dressing Wound #2 Right,Distal,Lateral Lower Leg o ABD pad Wound #3 Right,Anterior Lower Leg o ABD pad Wound #4 Right,Distal,Lateral Lower Leg o ABD pad Wound #5 Left,Anterior Lower Leg o ABD pad Wound #6 Left Lower Leg o ABD pad Dressing Change Frequency Wound #2 Right,Distal,Lateral Lower Leg o Dressing is to be changed Monday and Thursday. Wound #3 Right,Anterior Lower Leg o Dressing is to be changed Monday and Thursday. Wound #4 Right,Distal,Lateral Lower Leg Frediani, Sara R. ([id number]) o Dressing is to be changed Monday and Thursday. Wound #5 Left,Anterior Lower Leg o Dressing is to be changed Monday and Thursday. Wound #6 Left Lower Leg o Dressing is to be changed Monday and Thursday. Follow-up Appointments o Other: - Monday and Thursday appointment Edema Control Wound #2 Right,Distal,Lateral Lower Leg o 4 Layer Compression System  - Bilateral - unna to anchor o Elevate legs to the level of the heart and pump ankles as often as possible Wound #3 Right,Anterior Lower Leg o 4 Layer Compression System - Bilateral - unna to anchor o Elevate legs to the level of the heart and pump ankles as often as possible Wound #4 Right,Distal,Lateral Lower Leg o 4 Layer Compression System - Bilateral - unna to anchor o Elevate legs to the level of the heart and pump ankles as often as possible Wound #5 Left,Anterior Lower Leg o 4 Layer Compression System - Bilateral - unna to anchor o Elevate legs to the level of the heart and pump ankles as often as possible Wound #6 Left Lower Leg o 4 Layer Compression System - Bilateral - unna to anchor o Elevate legs to the level of the heart and pump ankles as often as possible Additional Orders / Instructions Wound #2 Right,Distal,Lateral Lower Leg o Vitamin A; Vitamin C, Zinc o Increase protein intake. o Other: - Please watch your salt (sodium) intake Laboratory o Bacteria identified in Wound by Culture (MICRO) - left posterior lower leg oooo LOINC Code: 6462-6 oooo Convenience Name: Wound culture routine Patient Medications Allergies: No Known Allergies Notifications Medication Indication Start End Bactrim DS 04/08/2018 DOSE 1 - oral 800 mg-160 mg tablet - 1 tablet oral taken 2 times a day for 10 days Electronic Signature(s) [name], [name] ([id number]) Signed: 04/08/2018 12:05:36 PM By: Lenda Kelp PA-C Entered By: Lenda Kelp on 04/08/2018 12:05:34 [name], [name] ([id number]) -------------------------------------------------------------------------------- Problem List Details Patient Name: Jessica Casey R. Date of Service: 04/08/2018 11:00 AM Medical Record Number: [id number] Patient Account Number:  [id number] Date of Birth/Sex: 1981-01-08 (36 y.o. F) Treating RN: Renne Crigler Primary Care Provider: PATIENT, NO Other Clinician: Referring Provider:  Referral, Self Treating Provider/Extender: Linwood Dibbles, HOYT Weeks in Treatment: 30 Active Problems ICD-10 Impacting Encounter Code Description Active Date Wound Healing Diagnosis L97.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d 09/10/2017 Yes I89.0 Lymphedema, not elsewhere classified 09/10/2017 Yes I87.311 Chronic venous hypertension (idiopathic) with ulcer of right 09/10/2017 Yes lower extremity E66.01 Morbid (severe) obesity due to excess calories 09/10/2017 Yes Inactive Problems Resolved Problems Electronic Signature(s) Signed: 04/09/2018 4:45:04 PM By: Lenda Kelp PA-C Entered By: Lenda Kelp on 04/08/2018 11:24:07 [name], [name] ([id number]) -------------------------------------------------------------------------------- Progress Note Details Patient Name: Jessica Casey R. Date of Service: 04/08/2018 11:00 AM Medical Record Number: [id number] Patient Account Number: [id number] Date of Birth/Sex: January 20, 1981 (37 y.o. F) Treating RN: Renne Crigler Primary Care Provider: PATIENT, NO Other Clinician: Referring Provider: Referral, Self Treating Provider/Extender: STONE III, HOYT Weeks in Treatment: 30 Subjective Chief Complaint Information obtained from Patient She is here in follow up evaluation of right lower extremity ulcers History of Present Illness (HPI) 37 year old patient well known to our [facility] wound care clinic where she has been seen since 2016 for bilateral lower extremity venous insufficiency disease with lymphedema and multiple ulcerations associated with morbid obesity. she had custom-made compression stockings and lymphedema pumps which were used in the past. most recently she was admitted to the hospital between October 11 and 09/02/2017 with sepsis, lower extremity wounds and lymphedema.she was initially treated in the outpatient with Keflex and Bactrim. she was initially treated in the hospital with vancomycin and Zosyn and  changed over to Unasyn until her white count improved and her blood cultures were negative for 3 days. After her inpatient management she was discharged home on Augmentin to end on 09/13/2017 with a 14 day course. she has had outpatient vascular duplex scans completed in November 2017 and her right ABI was 1.1 and the left ABI is 1.3. she had normal toe brachial indices bilaterally.she had three-vessel runoff in the right lower extremity and two-vessel runoff in the left lower extremity. On questioning the patient she does have custom made compression stockings and also has a lymphedema pump but has not been using it appropriately and has not been taking good care of herself. 09/17/2017 -- she returns today with compression stockings on the left side and the right side has had significant amount of drainage and has a very strong odor 09/24/2017 -- the drainage is increased significantly and she has more lymphedema and a very strong odor to her wound. Though she does not have systemic symptoms, or overt infection I believe she will benefit from some doxycycline given empirically. 10/01/2017 -- after starting the doxycycline and changing the dressing twice a week her symptoms and signs have definitely improved overall. 10/08/2017 -- she has completed her course of doxycycline but continues to have a lot of drainage and needs twice a week dressing changes. 11/08/17-she is here in follow-up evaluation for right lower extremity ulcers. She admits to using her lymphedema pumps twice daily, one hour per session. she is voicing no complaints or concerns, no signs of infection will change to [facility] 12/14/17 on evaluation today patient appears to be doing very well in regard to her wounds. She has been tolerating the dressing changes she continues to develop some portly the adherent granular tissue on the surface of the wound with some Slough. Obviously we are trying to get too  much better wound bed. With  that being said the hyper granulation the Hydrofera Blue Dressing to have helped with which is excellent news. However I think it may be time to try something a little bit different at this point. 01/11/18 on evaluation today patient appears to be doing fairly well in regard to her right lateral lower extremity ulcers. This shows excellent signs of filling in which is great news. There does not appear to be any evidence of infection which is also good news. She does continue to work as well is good school. She is having no pain. 01/22/18 on evaluation today patient appears to be doing a little bit worse in my opinion in regard to the overall quality of that [name], [name] ([id number]) granulation on her right lower extremity. She was not here last week due to being sick with a stomach virus this may have something to do with the fact that her wound appears to be a little bit worse. With that being said I'm also thinking that after switching from the [facility] Dressing to the silver collagen would really has not looked that's good in my opinion. We may want to swit 02/11/18 on evaluation today patient's right lower/lateral lower extremity ulcers appear to be doing very well at this point. Especially the more proximal ulcer has filled in much closer to surface which is good news. Nonetheless both show signs of improvement which is great news. There does not appear to be any evidence of infection which is also good news. In general patient has been doing well tolerating the wraps as well as the [employer]. 02/18/18 on evaluation today patient appears to be doing a little bit worse in regard to the periwound region the wounds themselves do not look much deteriorated to me. With that being said she has several small blisters/pustules noted in the periwound and there was a significant amount of drainage and maceration compared to previous. There has been a time that we had to bring her back  for twice a week dressing changes as far as her wrap was concerned it has been a while since we've done that however. With that being said the patient has been having some burning and in general I'm concerned about the possibility of infection. She has previously taken doxycycline with good result. Fortunately there does not appear to be any evidence of overall worsening in regard to the size of the wound and in fact the upper wound actually appears to be showing signs of good epithelialization. 03/18/18 on evaluation today patient appears to be doing excellent in regard to her right lateral lower extremity ulcer. She has been tolerating the dressing changes without complication. Fortunately this seems to be making great progress. Overall I see no signs of infection and there is dramatic improvement overall even compared to last week. 03/28/18 on evaluation today patient appears to be doing very well in regard to her right lateral lower surety ulcer. She has been tolerating the dressing changes without complication at this point. She states currently that she's having no significant discomfort which is excellent as well. Overall I'm pleased with how things seem to be progressing. 04/01/18 on evaluation today patient appears to be doing excellent in regard to her right lateral lower extremity ulcers. She has been tolerating the dressing changes without complication which is good news. With that being said the wraps still continues to show signs of helping with her fluid she does have Juxta-Lite compression stockings for  when we are done with the current treatment regimen once everything heals. Mainly she just has the one area still remaining the smaller of the two wings is pretty much closed at this point there's just a very slight opening noted. 04/08/18 on evaluation today patient appears to be doing better in regard to the original wound on the right lateral lower extremity that we have been managing.  Unfortunately she has a new area of weeping more anterior on the right lateral lower extremity and on the left lower extremity she has two new ulcers there appears to be some cellulitis noted at this time. I am concerned about the fact that this may in fact be an infection that has caused the worsening and swelling in the past this has been the case when we previously attempted to determine what was going on when she had down slides like this. With that being said the patient is seeming to tolerate the wraps fairly well for the most part. Patient History Information obtained from Patient. Family History Kidney Disease - Mother, No family history of Cancer, Diabetes, Heart Disease, Hereditary Spherocytosis, Hypertension, Lung Disease, Seizures, Stroke, Thyroid Problems, Tuberculosis. Social History Never smoker, Marital Status - Married, Alcohol Use - Never, Drug Use - No History, Caffeine Use - Daily. Review of Systems (ROS) Constitutional Symptoms (General Health) Denies complaints or symptoms of Fever, Chills. Respiratory The patient has no complaints or symptoms. Cardiovascular Complains or has symptoms of LE edema. Psychiatric DOSHIA, DALIA ([id number]) The patient has no complaints or symptoms. Objective Constitutional Well-nourished and well-hydrated in no acute distress. Vitals Time Taken: 11:02 AM, Height: 74 in, Weight: 505 lbs, BMI: 64.8, Temperature: 98.2 F, Pulse: 103 bpm, Respiratory Rate: 16 breaths/min, Blood Pressure: 126/85 mmHg. Respiratory normal breathing without difficulty. clear to auscultation bilaterally. Cardiovascular regular rate and rhythm with normal S1, S2. 3+ pitting edema of the bilateral lower extremities. Psychiatric this patient is able to make decisions and demonstrates good insight into disease process. Alert and Oriented x 3. pleasant and cooperative. General Notes: Patient did have some Slough noted on the surface of the wound which was in  some cases easily removed with saline and gauze although on the left lower extremity these forms to have [facility] noted that I did not sharply debride due to infection and pain although it did need it. We're gonna see how things do with the dressings over the next week and we will initiate treatment with antibiotics as well. Integumentary (Hair, Skin) Wound #2 status is Open. Original cause of wound was Gradually Appeared. The wound is located on the Right,Distal,Lateral Lower Leg. The wound measures 0.6cm length x 0.4cm width x 0.2cm depth; 0.188cm^2 area and 0.038cm^3 volume. Wound #3 status is Open. Original cause of wound was Gradually Appeared. The wound is located on the Right,Anterior Lower Leg. The wound measures 0.8cm length x 0.8cm width x 0.1cm depth; 0.503cm^2 area and 0.05cm^3 volume. There is no tunneling or undermining noted. There is a large amount of serous drainage noted. The wound margin is flat and intact. There is no granulation within the wound bed. There is no necrotic tissue within the wound bed. The periwound skin appearance exhibited: Induration. The periwound skin appearance did not exhibit: Callus, Crepitus, Excoriation, Rash, Scarring, Dry/Scaly, Maceration, Atrophie Blanche, Cyanosis, Ecchymosis, Hemosiderin Staining, Mottled, Pallor, Rubor, Erythema. Wound #4 status is Open. Original cause of wound was Gradually Appeared. The wound is located on the Right,Distal,Lateral Lower Leg. The wound measures 5cm length x 3cm  width x 0.1cm depth; 11.781cm^2 area and 1.178cm^3 volume. There is no tunneling or undermining noted. There is a large amount of serous drainage noted. The wound margin is flat and intact. There is no granulation within the wound bed. There is no necrotic tissue within the wound bed. The periwound skin appearance exhibited: Induration. The periwound skin appearance did not exhibit: Callus, Crepitus, Excoriation, Rash, Scarring, Dry/Scaly, Maceration,  Atrophie Blanche, Cyanosis, Ecchymosis, Hemosiderin Staining, Mottled, Pallor, Rubor, Erythema. Wound #5 status is Open. Original cause of wound was Gradually Appeared. The wound is located on the Left,Anterior Lower Leg. The wound measures 2.8cm length x 3.5cm width x 0.5cm depth; 7.697cm^2 area and 3.848cm^3 volume. There is Fat Layer (Subcutaneous Tissue) Exposed exposed. There is no tunneling or undermining noted. There is a medium amount of serosanguineous drainage noted. The wound margin is flat and intact. There is small (1-33%) red, hyper - granulation within [name], [name] ([id number]) the wound bed. There is a large (67-100%) amount of necrotic tissue within the wound bed including Eschar and Adherent Slough. Wound #6 status is Open. Original cause of wound was Gradually Appeared. The wound is located on the Left Lower Leg. The wound measures 1.1cm length x 1.2cm width x 0.2cm depth; 1.037cm^2 area and 0.207cm^3 volume. There is Fat Layer (Subcutaneous Tissue) Exposed exposed. There is no tunneling or undermining noted. There is a medium amount of serosanguineous drainage noted. The wound margin is flat and intact. There is no granulation within the wound bed. There is a large (67-100%) amount of necrotic tissue within the wound bed including Eschar and Adherent Slough. The periwound skin appearance exhibited: Hemosiderin Staining. The periwound skin appearance did not exhibit: Callus, Crepitus, Excoriation, Induration, Rash, Scarring, Dry/Scaly, Maceration, Atrophie Blanche, Cyanosis, Ecchymosis, Mottled, Pallor, Rubor, Erythema. 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 Cleanse wound with mild soap and water Wound #3 Right,Anterior Lower Leg: Clean wound with Normal Saline. Cleanse wound with mild soap and water Wound #4 Right,Distal,Lateral Lower Leg: Clean wound with Normal Saline. Cleanse wound with mild soap and water Wound #5 Left,Anterior Lower Leg: Clean wound with Normal Saline. Cleanse wound with mild soap and water Wound #6 Left Lower Leg: Clean wound with Normal Saline. Cleanse wound with mild soap and water Anesthetic (add to Medication List): Wound #2 Right,Distal,Lateral Lower Leg: Topical Lidocaine 4% cream applied to wound bed prior to debridement (In Clinic Only). Wound #3 Right,Anterior Lower Leg: Topical Lidocaine 4% cream applied to wound bed prior to debridement (In Clinic Only). Wound #4 Right,Distal,Lateral Lower Leg: Topical Lidocaine 4% cream applied to wound bed prior to debridement (In Clinic Only). Wound #5 Left,Anterior Lower Leg: Topical Lidocaine 4% cream applied to wound bed prior to debridement (In Clinic Only). Wound #6 Left Lower Leg: [name], [name] ([id number]) Topical Lidocaine 4% cream applied to wound bed prior to debridement (In Clinic Only). Skin Barriers/Peri-Wound Care: Wound #2 Right,Distal,Lateral Lower Leg: Moisturizing lotion Wound #3 Right,Anterior Lower Leg: Moisturizing lotion Wound #4 Right,Distal,Lateral Lower Leg: Moisturizing lotion Wound #5 Left,Anterior Lower Leg: Moisturizing lotion Wound #6 Left Lower Leg: Moisturizing lotion Primary Wound Dressing: Wound #2 Right,Distal,Lateral Lower Leg: Hydrafera Blue Ready Transfer Wound #3 Right,Anterior Lower Leg: Hydrafera Blue Ready  Transfer Wound #4 Right,Distal,Lateral Lower Leg: Hydrafera Blue Ready Transfer Wound #5 Left,Anterior Lower Leg: Hydrafera Blue Ready Transfer Wound #6 Left Lower Leg: Hydrafera Blue Ready Transfer Secondary Dressing: Wound #2 Right,Distal,Lateral Lower Leg: ABD  pad Wound #3 Right,Anterior Lower Leg: ABD pad Wound #4 Right,Distal,Lateral Lower Leg: ABD pad Wound #5 Left,Anterior Lower Leg: ABD pad Wound #6 Left Lower Leg: ABD pad Dressing Change Frequency: Wound #2 Right,Distal,Lateral Lower Leg: Dressing is to be changed Monday and Thursday. Wound #3 Right,Anterior Lower Leg: Dressing is to be changed Monday and Thursday. Wound #4 Right,Distal,Lateral Lower Leg: Dressing is to be changed Monday and Thursday. Wound #5 Left,Anterior Lower Leg: Dressing is to be changed Monday and Thursday. Wound #6 Left Lower Leg: Dressing is to be changed Monday and Thursday. Follow-up Appointments: Other: - Monday and Thursday appointment Edema Control: Wound #2 Right,Distal,Lateral Lower Leg: 4 Layer Compression System - Bilateral - unna to anchor Elevate legs to the level of the heart and pump ankles as often as possible Wound #3 Right,Anterior Lower Leg: 4 Layer Compression System - Bilateral - unna to anchor Elevate legs to the level of the heart and pump ankles as often as possible Wound #4 Right,Distal,Lateral Lower Leg: 4 Layer Compression System - Bilateral - unna to anchor Elevate legs to the level of the heart and pump ankles as often as possible Wound #5 Left,Anterior Lower Leg: 4 Layer Compression System - Bilateral - unna to anchor Elevate legs to the level of the heart and pump ankles as often as possible [name], [name] ([id number]) Wound #6 Left Lower Leg: 4 Layer Compression System - Bilateral - unna to anchor Elevate legs to the level of the heart and pump ankles as often as possible Additional Orders / Instructions: Wound #2 Right,Distal,Lateral Lower Leg: Vitamin A; Vitamin C, Zinc Increase protein intake. Other: - Please watch your salt (sodium) intake Laboratory ordered were: Wound culture routine - left posterior lower leg The following medication(s) was prescribed: Bactrim DS oral 800 mg-160 mg tablet 1 1 tablet oral  taken 2 times a day for 10 days starting 04/08/2018 I did go ahead and prescribe scepter DS for this patient and we will see how things do over the next week. Hopefully she will see signs of improvement and will see what the wound culture shows as well. She's in agreement with this plan. Please see above for specific wound care orders. We will see patient for re-evaluation in 1 week(s) here in the clinic. If anything worsens or changes patient will contact our office for additional recommendations. Electronic Signature(s) Signed: 04/09/2018 4:45:04 PM By: Lenda Kelp PA-C Entered By: Lenda Kelp on 04/09/2018 07:57:37 Skillman, Zi [name] Kitchen ([id number]) -------------------------------------------------------------------------------- ROS/PFSH Details Patient Name: Jessica Casey R. Date of Service: 04/08/2018 11:00 AM Medical Record Number: [id number] Patient Account Number: [id number] Date of Birth/Sex: January 24, 1981 (37 y.o. F) Treating RN: Renne Crigler Primary Care Provider: PATIENT, NO Other Clinician: Referring Provider: Referral, Self Treating Provider/Extender: STONE III, HOYT Weeks in Treatment: 30 Information Obtained From Patient Wound History Do you currently have one or more open woundso Yes How many open wounds do you currently haveo 1 Approximately how long have you had your woundso 1 month How have you been treating your wound(s) until nowo aquacel ag Has your wound(s) ever healed and then re-openedo No Have you had any lab work done in the past montho No Have you tested positive for an antibiotic resistant organism (MRSA, VRE)o No Have you tested positive for  osteomyelitis (bone infection)o No Have you had any tests for circulation on your legso Yes Who ordered the testo [facility] Where was the test doneo [facility] Constitutional Symptoms (General Health) Complaints and Symptoms: Negative for: Fever; Chills Cardiovascular Complaints and Symptoms: Positive for: LE  edema Medical History: Positive for: Hypertension Negative for: Angina; Arrhythmia; Congestive Heart Failure; Coronary Artery Disease; Deep Vein Thrombosis; Hypotension; Myocardial Infarction; Peripheral Arterial Disease; Peripheral Venous Disease; Phlebitis; Vasculitis Hematologic/Lymphatic Medical History: Positive for: Lymphedema Respiratory Complaints and Symptoms: No Complaints or Symptoms Medical History: Negative for: Aspiration; Asthma; Chronic Obstructive Pulmonary Disease (COPD); Pneumothorax; Sleep Apnea; Tuberculosis Psychiatric Complaints and Symptoms: No Complaints or Symptoms [name], Anice R. ([id number]) Immunizations Pneumococcal Vaccine: Received Pneumococcal Vaccination: No Immunization Notes: up to date Implantable Devices Family and Social History Cancer: No; Diabetes: No; Heart Disease: No; Hereditary Spherocytosis: No; Hypertension: No; Kidney Disease: Yes - Mother; Lung Disease: No; Seizures: No; Stroke: No; Thyroid Problems: No; Tuberculosis: No; Never smoker; Marital Status - Married; Alcohol Use: Never; Drug Use: No History; Caffeine Use: Daily; Financial Concerns: No; Food, Clothing or Shelter Needs: No; Support System Lacking: No; Transportation Concerns: No; Advanced Directives: No; Patient does not want information on Advanced Directives Physician Affirmation I have reviewed and agree with the above information. Electronic Signature(s) Signed: 04/09/2018 4:45:04 PM By: Lenda Kelp PA-C Signed: 04/10/2018 7:52:46 AM By: Renne Crigler Entered By: Lenda Kelp on 04/09/2018 07:56:34 Bouknight, Jalina [name] Kitchen ([id number]) -------------------------------------------------------------------------------- SuperBill Details Patient Name: Jessica Casey R. Date of Service: 04/08/2018 Medical Record Number: [id number] Patient Account Number: [id number] Date of Birth/Sex: 27-May-1981 (37 y.o. 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 E66.01 Morbid (severe) obesity due to excess calories Facility Procedures CPT4 Code: [id number] Description: [phone] - WOUND CARE VISIT-LEV 5 EST PT Modifier: Quantity: 1 Physician Procedures CPT4 Code: [id number] Description: [id number] - WC PHYS LEVEL 4 - EST PT ICD-10 Diagnosis Description L97.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 I89.0 Lymphedema, not elsewhere classified I87.311 Chronic venous hypertension (idiopathic) with ulcer of  right l E66.01 Morbid (severe) obesity due to excess calories Modifier: d ower extremity Quantity: 1 Electronic Signature(s) Signed: 04/09/2018 4:45:04 PM By: Lenda Kelp PA-C Previous Signature: 04/08/2018 5:05:05 PM Version By: Renne Crigler Entered By: Lenda Kelp on 04/09/2018 07:58:54

## 2018-04-11 NOTE — ED Provider Notes (Addendum)
[name]-EMERGENCY DEPT Provider Note   CSN: [id number] Arrival date & time: 04/11/18  1810     History   Chief Complaint Chief Complaint  Patient presents with  . Fever    HPI [name] is a 37 y.o. female with a hx of HTN, BLE lymphedema, morbid obesity, and lower extremity ulcer who presents to the ED for fever/chills over past 24 hours. Patient states that she sees the wound clinic regularly for management of her lower extremity ulcers, she states that most recent visit 05/20 new ulcers were identified and there was concern for overlying cellulitis. Patient was started on bactrim. She states she had been taking this as prescribed. Started to have fever/chills- Temp Max 104 this afternoon- took 600 mg of Ibuprofen for this at 17:45. No other specific alleviating/aggravating factors. She states she did have some itching that made her worry she was allergic to bactrim- no rash, no dyspnea, no facial swelling or feeling of throat closing, the pruritus has resolved. States she has had some associated nausea without vomiting. Denies numbness, weakness, diarrhea, dyspnea, or chest pain.   HPI  Past Medical History:  Diagnosis Date  . Hypertension    "just today" (08/30/2017)  . Lymphedema of both lower extremities   . Morbid obesity [name])     Patient Active Problem List   Diagnosis Date Noted  . Multiple open wounds of left lower extremity 09/01/2017  . Papular rash, localized 09/01/2017  . Sepsis (HCC) 09/01/2015  . Lymphedema 09/01/2015  . Cellulitis of right lower extremity 09/01/2015    Past Surgical History:  Procedure Laterality Date  . NO PAST SURGERIES       OB History    Gravida  0   Para      Term      Preterm      AB      Living        SAB      TAB      Ectopic      Multiple      Live Births               Home Medications    Prior to Admission medications   Medication Sig Start Date End Date Taking?  Authorizing Provider  doxycycline (VIBRAMYCIN) 100 MG capsule Take 100 mg 2 (two) times daily by mouth.    [provider]  ibuprofen (ADVIL,MOTRIN) 200 MG tablet Take 800 mg by mouth every 6 (six) hours as needed for moderate pain.    [provider]    Family History Family History  Problem Relation Age of Onset  . Renal Disease Mother     Social History Social History   Tobacco Use  . Smoking status: Never Smoker  . Smokeless tobacco: Never Used  Substance Use Topics  . Alcohol use: No  . Drug use: No     Allergies   Patient has no known allergies.   Review of Systems Review of Systems  Constitutional: Positive for chills and fever.  Respiratory: Negative for cough and shortness of breath.   Cardiovascular: Positive for leg swelling (chronic unchanged).  Gastrointestinal: Positive for nausea. Negative for constipation, diarrhea and vomiting.  Genitourinary: Negative for dysuria.  Skin: Positive for wound. Negative for rash.  All other systems reviewed and are negative.    Physical Exam Updated Vital Signs BP (!) 123/101 (BP Location: Left Arm)   Pulse (!) 135   Temp (!) 100.5 F (  38.1 C) (Oral)   Resp 20   SpO2 96%   Physical Exam  Constitutional: She appears well-developed and well-nourished. No distress.  HENT:  Head: Normocephalic and atraumatic.  Eyes: Conjunctivae are normal. Right eye exhibits no discharge. Left eye exhibits no discharge.  Cardiovascular: Regular rhythm. Tachycardia present.  No murmur heard. Pulses:      Dorsalis pedis pulses are 2+ on the right side, and 2+ on the left side.  Pulmonary/Chest: Effort normal and breath sounds normal. No respiratory distress. She has no wheezes. She has no rhonchi. She has no rales.  Abdominal: Soft. She exhibits no distension. There is no tenderness. There is no rigidity, no rebound and no guarding.  Musculoskeletal:  Lower extremities: There is edema to bilateral lower  extremities, overlying venous stasis changes. There is a superficial ulceration/wheeping to the lateral RLE which has underlying/surrounding palpable fluctuance and subsequent induration, additional ulcer just lateral to this more posteriorly. LLE with  Ulceration to the anterolateral lower leg and another ulcer that is smaller and more posteriorly to this. There is overlying warmth and mild erythema. Pictured below.   Neurological: She is alert.  Clear speech. Sensation grossly intact to bilateral lower extremities. 5/5 strength with plantar/dorsiflexion bilaterally.   Skin: Skin is warm and dry. No rash noted.  Psychiatric: She has a normal mood and affect. Her behavior is normal.  Nursing note and vitals reviewed.            ED Treatments / Results  Labs Results for orders placed or performed during the [name] encounter of 04/11/18  Comprehensive metabolic panel  Result Value Ref Range   Sodium 136 135 - 145 mmol/L   Potassium 4.0 3.5 - 5.1 mmol/L   Chloride 103 101 - 111 mmol/L   CO2 23 22 - 32 mmol/L   Glucose, Bld 112 (H) 65 - 99 mg/dL   BUN 12 6 - 20 mg/dL   Creatinine, Ser 9.60 (H) 0.44 - 1.00 mg/dL   Calcium 9.1 8.9 - 45.4 mg/dL   Total Protein 8.0 6.5 - 8.1 g/dL   Albumin 3.9 3.5 - 5.0 g/dL   AST 18 15 - 41 U/L   ALT 13 (L) 14 - 54 U/L   Alkaline Phosphatase 51 38 - 126 U/L   Total Bilirubin 1.2 0.3 - 1.2 mg/dL   GFR calc non Af Amer >60 >60 mL/min   GFR calc Af Amer >60 >60 mL/min   Anion gap 10 5 - 15  CBC with Differential  Result Value Ref Range   WBC 15.6 (H) 4.0 - 10.5 K/uL   RBC 4.01 3.87 - 5.11 MIL/uL   Hemoglobin 13.0 12.0 - 15.0 g/dL   HCT 09.8 11.9 - 14.7 %   MCV 97.8 78.0 - 100.0 fL   MCH 32.4 26.0 - 34.0 pg   MCHC 33.2 30.0 - 36.0 g/dL   RDW 82.9 56.2 - 13.0 %   Platelets 254 150 - 400 K/uL   Neutrophils Relative % 83 %   Neutro Abs 13.0 (H) 1.7 - 7.7 K/uL   Lymphocytes Relative 10 %   Lymphs Abs 1.5 0.7 - 4.0 K/uL   Monocytes Relative 7  %   Monocytes Absolute 1.1 (H) 0.1 - 1.0 K/uL   Eosinophils Relative 0 %   Eosinophils Absolute 0.0 0.0 - 0.7 K/uL   Basophils Relative 0 %   Basophils Absolute 0.0 0.0 - 0.1 K/uL  Protime-INR  Result Value Ref Range   Prothrombin Time 14.0  11.4 - 15.2 seconds   INR 1.09   I-Stat CG4 Lactic Acid, ED  Result Value Ref Range   Lactic Acid, Venous 1.12 0.5 - 1.9 mmol/L  I-Stat beta hCG blood, ED  Result Value Ref Range   I-stat hCG, quantitative <5.0 <5 mIU/mL   Comment 3            EKG EKG Interpretation  Date/Time:  Thursday Apr 11 2018 19:30:48 EDT Ventricular Rate:  110 PR Interval:    QRS Duration: 85 QT Interval:  328 QTC Calculation: 444 R Axis:   -27 Text Interpretation:  Sinus tachycardia Inferior infarct, old Lateral leads are also involved Confirmed by Jacalyn Lefevre [phone]) on 04/11/2018 7:36:37 PM   Radiology Dg Chest 2 View  Result Date: 04/11/2018 CLINICAL DATA:  37 year old female with fever. EXAM: CHEST - 2 VIEW COMPARISON:  08/30/2017 and prior chest radiographs FINDINGS: The cardiomediastinal silhouette is unremarkable. Mild peribronchial thickening is unchanged. There is no evidence of focal airspace disease, pulmonary edema, suspicious pulmonary nodule/mass, pleural effusion, or pneumothorax. No acute bony abnormalities are identified. IMPRESSION: No active cardiopulmonary disease. Electronically Signed   By: Harmon Pier M.D.   On: 04/11/2018 19:19   Dg Tibia/fibula Left  Result Date: 04/11/2018 CLINICAL DATA:  Bilateral LOWER extremity cellulitis. EXAM: LEFT TIBIA AND FIBULA - 2 VIEW COMPARISON:  None. FINDINGS: Diffuse soft tissue swelling noted without gas or radiopaque foreign body. No acute fracture, subluxation, dislocation or osteomyelitis noted. No focal bony lesions are present. IMPRESSION: Diffuse soft tissue swelling without acute bony abnormality. Electronically Signed   By: Harmon Pier M.D.   On: 04/11/2018 20:37   Dg Tibia/fibula  Right  Result Date: 04/11/2018 CLINICAL DATA:  RIGHT LOWER extremity cellulitis. Initial encounter. EXAM: RIGHT TIBIA AND FIBULA - 2 VIEW COMPARISON:  None. FINDINGS: Diffuse soft tissue swelling noted without gas or radiopaque foreign body. No acute fracture, subluxation, dislocation or osteomyelitis identified. No focal bony lesions are present. IMPRESSION: Soft tissue swelling without acute bony abnormality. Electronically Signed   By: Harmon Pier M.D.   On: 04/11/2018 20:38    Procedures .Marland KitchenIncision and Drainage Date/Time: 04/11/2018 10:33 PM Performed by: Cherly Anderson, PA-C Authorized by: Cherly Anderson, PA-C   Consent:    Consent obtained:  Verbal   Consent given by:  Patient   Risks discussed:  Bleeding, incomplete drainage and pain   Alternatives discussed:  No treatment Location:    Type:  Abscess   Location:  Lower extremity   Lower extremity location:  Leg   Leg location:  R lower leg Pre-procedure details:    Skin preparation:  Betadine Anesthesia (see MAR for exact dosages):    Anesthesia method:  Local infiltration   Local anesthetic:  Lidocaine 1% w/o epi Procedure type:    Complexity:  Simple Procedure details:    Incision types:  Stab incision   Scalpel blade:  11   Wound management:  Probed and deloculated and irrigated with saline   Drainage:  Bloody and purulent   Drainage amount: mild amount.   Wound treatment:  Wound left open   Packing materials:  None Post-procedure details:    Patient tolerance of procedure:  Tolerated well, no immediate complications   (including critical care time)  CRITICAL CARE Performed by: Harvie Heck   Total critical care time: 35 minutes  Critical care time was exclusive of separately billable procedures and treating other patients.  Critical care was necessary to treat or prevent imminent or  life-threatening deterioration.  Critical care was time spent personally by me on the following  activities: development of treatment plan with patient and/or surrogate as well as nursing, discussions with consultants, evaluation of patient's response to treatment, examination of patient, obtaining history from patient or surrogate, ordering and performing treatments and interventions, ordering and review of laboratory studies, ordering and review of radiographic studies, pulse oximetry and re-evaluation of patient's condition.  Medications Ordered in ED Medications - No data to display   Initial Impression / Assessment and Plan / ED Course  I have reviewed the triage vital signs and the nursing notes.  Pertinent labs & imaging results that were available during my care of the patient were reviewed by me and considered in my medical decision making (see chart for details).   Patient presents with fever after wound care clinic started bactrim due to concern for new lower extremity ulcerations w/ cellulitis. Patient with hx of bilateral lower extremity venous insufficiency disease with lymphedema and multiple ulcerations with morbid obesity. Seen by wound care clinic 05/20- note reviewed- was found to have new weeping area to anterior right lateral lower extremity and two new ulcers to the left lower extremity, appeared to have cellulitis and was started on bactrim. Has been taking as prescribed, has had 4 doses. Over past 24 hours has had fever/chills. Upon arrival to the ED she is febrile and tachycardic with lower extremity exam concerning for bilateral cellulitis and possible abscess formation to the RLE wound area. Code sepsis was initiated, abx started. Discussed with supervising physician Dr. Particia Nearing- will give 2L of NS as opposed to weight based fluids given patient's morbid obesity. Dr. Particia Nearing personally evaluated/examined this patient- instructs I&D of lateral lower portion of the RLE in area of fluctuance. In addition to sepsis protocol work-up will obtain BLE tib/fib x-rays to evaluate  for potential osteomyelitis. Tylenol for fever.   Labs reviewed: Leukocytosis at 15.6 with left shift. Lactic acid 1.12. Creatinine 1.07. Otherwise fairly unremarkable. CXR negative. Tib/fib x-rays with soft tissue swelling- no acute bony abnormalities. I&D performed per procedure note above, mild amount of mostly bloody drainage, mildly purulent.  Additionally of note patient with occasional desaturation to 88-90%- this appears to clinically correlate with patient being asleep- this has occurred when I am in the room- upon arousal SpO2 maintains > 92%, in combination with morbid obesity there is suspicion for possible OSA. Patient is not short of breath she has not had any chest pain. Will plan for hospitalist admission for sepsis.   Findings and plan of care discussed with supervising physician Dr. Particia Nearing who personally evaluated and examined this patient and is in agreement.   22:15: CONSULT: Discussed with hospitalist Dr. Mariea Clonts who accepts admission. Requesting culture of I&D site- this was performed.    Final Clinical Impressions(s) / ED Diagnoses   Final diagnoses:  Sepsis, due to unspecified organism [facility])    ED Discharge Orders    None       Cherly Anderson, PA-C 04/11/18 2243  Addendum performed to include lab results.     Desmond Lope 04/11/18 2244    Jacalyn Lefevre, MD 04/11/18 2249

## 2018-04-11 NOTE — ED Notes (Signed)
ED TO INPATIENT HANDOFF REPORT  Name/Age/Gender [name] 37 y.o. female  Code Status Code Status History    Date Active Date Inactive Code Status Order ID Comments User Context   08/30/2017 1544 09/02/2017 2137 Full Code [id number]  Shela Leff, MD ED   09/01/2015 1316 09/07/2015 1556 Full Code [id number]  Elmarie Shiley, MD Inpatient      Home/SNF/Other Home  Chief Complaint fever   Level of Care/Admitting Diagnosis ED Disposition    ED Disposition Condition [facility] Area: [facility] [[id number]]  Level of Care: Telemetry [5]  Admit to tele based on following criteria: Other see comments  Comments: Sepsis  Diagnosis: Cellulitis [[id number]]  Admitting Physician: Bethena Roys [[id number]]  Attending Physician: Bethena Roys [phone]  Estimated length of stay: past midnight tomorrow  Certification:: I certify this patient will need inpatient services for at least 2 midnights  PT Class (Do Not Modify): Inpatient [101]  PT Acc Code (Do Not Modify): Private [1]       Medical History Past Medical History:  Diagnosis Date  . Hypertension    "just today" (08/30/2017)  . Lymphedema of both lower extremities   . Morbid obesity (HCC)     Allergies Allergies  Allergen Reactions  . Bactrim [Sulfamethoxazole-Trimethoprim] Itching  . Sulfa Antibiotics Itching    IV Location/Drains/Wounds Patient Lines/Drains/Airways Status   Active Line/Drains/Airways    Name:   Placement date:   Placement time:   Site:   Days:   Peripheral IV 04/11/18 Right Antecubital   04/11/18    1857    Antecubital   less than 1   Wound / Incision (Open or Dehisced) 09/01/15 Other (Comment) Leg Right;Lower    09/01/15    1045    Leg   953   Wound / Incision (Open or Dehisced) 08/30/17 Venous stasis ulcer Leg Right;Lower   08/30/17    1855    Leg   224   Wound / Incision (Open or Dehisced) 08/30/17 Venous stasis ulcer Leg Right;Lower;Posterior    08/30/17    1859    Leg   224   Wound / Incision (Open or Dehisced) 08/30/17 Venous stasis ulcer Leg Right;Lower;Anterior   08/30/17    1900    Leg   224   Wound / Incision (Open or Dehisced) 08/30/17 Venous stasis ulcer Leg Left;Lower   08/30/17    1902    Leg   224          Labs/Imaging Results for orders placed or performed during the hospital encounter of 04/11/18 (from the past 48 hour(s))  Comprehensive metabolic panel     Status: Abnormal   Collection Time: 04/11/18  6:50 PM  Result Value Ref Range   Sodium 136 135 - 145 mmol/L   Potassium 4.0 3.5 - 5.1 mmol/L   Chloride 103 101 - 111 mmol/L   CO2 23 22 - 32 mmol/L   Glucose, Bld 112 (H) 65 - 99 mg/dL   BUN 12 6 - 20 mg/dL   Creatinine, Ser 1.07 (H) 0.44 - 1.00 mg/dL   Calcium 9.1 8.9 - 10.3 mg/dL   Total Protein 8.0 6.5 - 8.1 g/dL   Albumin 3.9 3.5 - 5.0 g/dL   AST 18 15 - 41 U/L   ALT 13 (L) 14 - 54 U/L   Alkaline Phosphatase 51 38 - 126 U/L   Total Bilirubin 1.2 0.3 - 1.2 mg/dL   GFR calc  non Af Amer >60 >60 mL/min   GFR calc Af Amer >60 >60 mL/min    Comment: (NOTE) The eGFR has been calculated using the CKD EPI equation. This calculation has not been validated in all clinical situations. eGFR's persistently <60 mL/min signify possible Chronic Kidney Disease.    Anion gap 10 5 - 15    Comment: Performed at [facility], [place] [address], [place], [place] [address]  CBC with Differential     Status: Abnormal   Collection Time: 04/11/18  6:50 PM  Result Value Ref Range   WBC 15.6 (H) 4.0 - 10.5 K/uL   RBC 4.01 3.87 - 5.11 MIL/uL   Hemoglobin 13.0 12.0 - 15.0 g/dL   HCT 39.2 36.0 - 46.0 %   MCV 97.8 78.0 - 100.0 fL   MCH 32.4 26.0 - 34.0 pg   MCHC 33.2 30.0 - 36.0 g/dL   RDW 12.6 11.5 - 15.5 %   Platelets 254 150 - 400 K/uL   Neutrophils Relative % 83 %   Neutro Abs 13.0 (H) 1.7 - 7.7 K/uL   Lymphocytes Relative 10 %   Lymphs Abs 1.5 0.7 - 4.0 K/uL   Monocytes Relative 7 %   Monocytes  Absolute 1.1 (H) 0.1 - 1.0 K/uL   Eosinophils Relative 0 %   Eosinophils Absolute 0.0 0.0 - 0.7 K/uL   Basophils Relative 0 %   Basophils Absolute 0.0 0.0 - 0.1 K/uL    Comment: Performed at [facility], [place] [address], [place], [place] [address]  Protime-INR     Status: None   Collection Time: 04/11/18  6:50 PM  Result Value Ref Range   Prothrombin Time 14.0 11.4 - 15.2 seconds   INR 1.09     Comment: Performed at [facility], [place] [address] [place], [place] [address]  I-Stat CG4 Lactic Acid, ED     Status: None   Collection Time: 04/11/18  7:01 PM  Result Value Ref Range   Lactic Acid, Venous 1.12 0.5 - 1.9 mmol/L  I-Stat beta hCG blood, ED     Status: None   Collection Time: 04/11/18  7:10 PM  Result Value Ref Range   I-stat hCG, quantitative <5.0 <5 mIU/mL   Comment 3            Comment:   GEST. AGE      CONC.  (mIU/mL)   <=1 WEEK        5 - 50     2 WEEKS       50 - 500     3 WEEKS       100 - 10,000     4 WEEKS     1,000 - 30,000        FEMALE AND NON-PREGNANT FEMALE:     LESS THAN 5 mIU/mL    Dg Chest 2 View  Result Date: 04/11/2018 CLINICAL DATA:  37 year old female with fever. EXAM: CHEST - 2 VIEW COMPARISON:  08/30/2017 and prior chest radiographs FINDINGS: The cardiomediastinal silhouette is unremarkable. Mild peribronchial thickening is unchanged. There is no evidence of focal airspace disease, pulmonary edema, suspicious pulmonary nodule/mass, pleural effusion, or pneumothorax. No acute bony abnormalities are identified. IMPRESSION: No active cardiopulmonary disease. Electronically Signed   By: Margarette Canada M.D.   On: 04/11/2018 19:19   Dg Tibia/fibula Left  Result Date: 04/11/2018 CLINICAL DATA:  Bilateral LOWER extremity cellulitis. EXAM: LEFT TIBIA AND FIBULA - 2 VIEW COMPARISON:  None. FINDINGS: Diffuse soft tissue swelling noted without gas or radiopaque foreign body. No acute fracture, subluxation, dislocation or  osteomyelitis noted. No focal bony lesions are present. IMPRESSION: Diffuse soft tissue swelling without acute bony abnormality. Electronically Signed   By: Margarette Canada M.D.   On: 04/11/2018 20:37   Dg Tibia/fibula Right  Result Date: 04/11/2018 CLINICAL DATA:  RIGHT LOWER extremity cellulitis. Initial encounter. EXAM: RIGHT TIBIA AND FIBULA - 2 VIEW COMPARISON:  None. FINDINGS: Diffuse soft tissue swelling noted without gas or radiopaque foreign body. No acute fracture, subluxation, dislocation or osteomyelitis identified. No focal bony lesions are present. IMPRESSION: Soft tissue swelling without acute bony abnormality. Electronically Signed   By: Margarette Canada M.D.   On: 04/11/2018 20:38    Pending Labs Unresulted Labs (From admission, onward)   Start     Ordered   04/11/18 2227  Wound or Superficial Culture  Once,   STAT     04/11/18 2227   04/11/18 1818  Culture, blood (Routine x 2)  BLOOD CULTURE X 2,   STAT     04/11/18 1818   04/11/18 1818  Urinalysis, Routine w reflex microscopic  STAT,   STAT     04/11/18 1818   Signed and Held  Basic metabolic panel  Tomorrow morning,   R     Signed and Held   Signed and Held  CBC  Tomorrow morning,   R     Signed and Held      Vitals/Pain Today's Vitals   04/11/18 2030 04/11/18 2130 04/11/18 2200 04/11/18 2235  BP: 111/65 109/61 117/73 102/66  Pulse: (!) 112 100 92 (!) 101  Resp:  (!) 28 20 (!) 22  Temp:    98.4 F (36.9 C)  TempSrc:    Oral  SpO2: 96% (!) 88%  94%  PainSc:    0-No pain    Isolation Precautions No active isolations  Medications Medications  vancomycin (VANCOCIN) IVPB 1000 mg/200 mL premix (0 mg Intravenous Stopped 04/11/18 2338)  piperacillin-tazobactam (ZOSYN) IVPB 3.375 g (has no administration in time range)  vancomycin (VANCOCIN) 1,750 mg in sodium chloride 0.9 % 500 mL IVPB (has no administration in time range)  acetaminophen (TYLENOL) tablet 1,000 mg (1,000 mg Oral Given 04/11/18 1925)   piperacillin-tazobactam (ZOSYN) IVPB 3.375 g (0 g Intravenous Stopped 04/11/18 2001)  sodium chloride 0.9 % bolus 1,000 mL (0 mLs Intravenous Stopped 04/11/18 2233)  lidocaine (PF) (XYLOCAINE) 1 % injection 10 mL (10 mLs Infiltration Given by Other 04/11/18 1925)  vancomycin (VANCOCIN) 1,500 mg in sodium chloride 0.9 % 500 mL IVPB (0 mg Intravenous Stopped 04/11/18 2157)  diphenhydrAMINE (BENADRYL) capsule 25 mg (25 mg Oral Given 04/11/18 2011)    Mobility walks

## 2018-04-11 NOTE — ED Notes (Signed)
Patient transported to X-ray 

## 2018-04-11 NOTE — Progress Notes (Signed)
Pharmacy Antibiotic Note  [name] is a 37 y.o. female admitted on 04/11/2018 with cellulitis.  Pharmacy has been consulted for Vancomycin and Zosyn dosing.  Plan: Zosyn 3.375g IV q8h (4 hour infusion).   Vancomycin  (  +  in ED) iv x1, then  iv q12hr  Goal AUC = 400 - 500 for all indications, except meningitis (goal AUC > 500 and Cmin 15-20 mcg/mL)     Temp (24hrs), Avg:99.5 F (37.5 C), Min:98.4 F (36.9 C), Max:100.5 F (38.1 C)  Recent Labs  Lab 04/11/18 1850 04/11/18 1901  WBC 15.6*  --   CREATININE 1.07*  --   LATICACIDVEN  --  1.12    CrCl cannot be calculated (Unknown ideal weight.).    Allergies  Allergen Reactions  . Bactrim [Sulfamethoxazole-Trimethoprim] Itching  . Sulfa Antibiotics Itching    Antimicrobials this admission: Vancomycin 04/11/2018 >> Zosyn 04/11/2018 >>   Dose adjustments this admission: -  Microbiology results: -  Thank you for allowing pharmacy to be a part of this patient's care.  [name] 04/11/2018 11:50 PM

## 2018-04-11 NOTE — Progress Notes (Signed)
A consult was received from an ED physician for vancomycin and zosyn per pharmacy dosing.  The patient's profile has been reviewed for ht/wt/allergies/indication/available labs.     A one time order has been placed for vancomycin 1500 mg in addition to vancomycin 1000 mg for total loading dose = 2500 mg and zosyn 3.375 gm IV x 1 dose.   Further antibiotics/pharmacy consults should be ordered by admitting physician if indicated.                       Thank you, Herby Abraham, Pharm.D. [phone] 04/11/2018 7:13 PM

## 2018-04-11 NOTE — ED Notes (Signed)
This nurse collected urine culture and sent down to lab

## 2018-04-12 ENCOUNTER — Ambulatory Visit: Payer: Self-pay

## 2018-04-12 ENCOUNTER — Other Ambulatory Visit: Payer: Self-pay

## 2018-04-12 LAB — BASIC METABOLIC PANEL
Anion gap: 10 (ref 5–15)
BUN: 12 mg/dL (ref 6–20)
CO2: 24 mmol/L (ref 22–32)
Calcium: 8.7 mg/dL — ABNORMAL LOW (ref 8.9–10.3)
Chloride: 104 mmol/L (ref 101–111)
Creatinine, Ser: 0.98 mg/dL (ref 0.44–1.00)
GFR calc Af Amer: >60 mL/min (ref >60)
GFR calc non Af Amer: >60 mL/min (ref >60)
Glucose, Bld: 95 mg/dL (ref 65–99)
Potassium: 4.2 mmol/L (ref 3.5–5.1)
Sodium: 138 mmol/L (ref 135–145)

## 2018-04-12 LAB — URINALYSIS, ROUTINE W REFLEX MICROSCOPIC
Bacteria, UA: NONE SEEN
Bilirubin Urine: NEGATIVE
Glucose, UA: NEGATIVE mg/dL
Ketones, ur: NEGATIVE mg/dL
Leukocytes, UA: NEGATIVE
Nitrite: NEGATIVE
Protein, ur: 30 mg/dL — AB
Specific Gravity, Urine: 1.016 (ref 1.005–1.030)
pH: 6 (ref 5.0–8.0)

## 2018-04-12 LAB — CBC
HCT: 39.3 % (ref 36.0–46.0)
Hemoglobin: 12.6 g/dL (ref 12.0–15.0)
MCH: 31.7 pg (ref 26.0–34.0)
MCHC: 32.1 g/dL (ref 30.0–36.0)
MCV: 99 fL (ref 78.0–100.0)
Platelets: 231 10*3/uL (ref 150–400)
RBC: 3.97 MIL/uL (ref 3.87–5.11)
RDW: 13.1 % (ref 11.5–15.5)
WBC: 10.8 10*3/uL — ABNORMAL HIGH (ref 4.0–10.5)

## 2018-04-12 MED ORDER — SODIUM CHLORIDE 0.9 % IV SOLN
INTRAVENOUS | Status: AC
  Administered 2018-04-12: 02:00:00 via INTRAVENOUS

## 2018-04-12 MED ORDER — ONDANSETRON HCL 4 MG/2ML IJ SOLN: 4.0000 mg | Freq: Four times a day (QID) | INTRAMUSCULAR | Status: DC | PRN

## 2018-04-12 MED ORDER — ACETAMINOPHEN 650 MG RE SUPP: 650.0000 mg | Freq: Four times a day (QID) | RECTAL | Status: DC | PRN

## 2018-04-12 MED ORDER — ONDANSETRON HCL 4 MG PO TABS: 4.0000 mg | ORAL_TABLET | Freq: Four times a day (QID) | ORAL | Status: DC | PRN

## 2018-04-12 MED ORDER — SODIUM CHLORIDE 0.9 % IV SOLN: 1.0000 g | INTRAVENOUS | Status: DC

## 2018-04-12 MED ORDER — ACETAMINOPHEN 325 MG PO TABS
650.0000 mg | ORAL_TABLET | Freq: Four times a day (QID) | ORAL | Status: DC | PRN
  Administered 2018-04-12 (×2): 650 mg via ORAL
  Filled 2018-04-12 (×2): qty 2

## 2018-04-12 MED ORDER — SODIUM CHLORIDE 0.9 % IV SOLN
2.0000 g | INTRAVENOUS | Status: DC
  Filled 2018-04-12: qty 20

## 2018-04-12 MED ORDER — ENOXAPARIN SODIUM 100 MG/ML ~~LOC~~ SOLN
100.0000 mg | SUBCUTANEOUS | Status: DC
  Administered 2018-04-12 – 2018-04-13 (×2): 100 mg via SUBCUTANEOUS
  Filled 2018-04-12 (×3): qty 1

## 2018-04-12 NOTE — Consult Note (Signed)
WOC Nurse wound consult note Reason for Consult:LE wound Patient with known lymphedema, followed by the wound care center. Has Juxtalight compression stockings and lymphedema pumps at home that is has used for some time. She is managed quite well at home.  New onset of cellulitis of the right lateral calf.  Wound type: venous ulcerations with venous stasis and lymphedema  Pressure Injury POA: NA Measurement: Left pretibial: 3.5cm 2.5cm x 0.3cm  Left posterior calf: 1cm x 1cm x 0.2cm  Right lateral: generalized area 10cm x 20cm x 0.1cm with 5-6 scattered partial thickness areas of skin loss Wound bed: Left pretibial:75% pink/25% fibrin/soft slough Left posterior: 100% light yellow, dry fibrin Right lateral: all open areas are 100% pink Drainage (amount, consistency, odor) minimal from the LLE wounds, serosanguinous; moderate from the right lateral, serosanguinous, no odor from either site   Periwound:lymphadema with acute cellulitis (erthyma and induration) of the right lateral calf  Dressing procedure/placement/frequency: Silver hydrofiber over open areas, cover with dry dressing. Secure with 4" ACE wrap.   Change every 3 days.  Patient to keep routine follow up with [facility] [place].  Discussed POC with patient and bedside nurse.  Re consult if needed, will not follow at this time. Thanks  Derold Dorsch [employer], RN,CWOCN, CNS, CWON-AP [phone])

## 2018-04-12 NOTE — Progress Notes (Signed)
Lovenox per Pharmacy for DVT Prophylaxis    Pharmacy has been consulted from dosing enoxaparin (lovenox) in this patient for DVT prophylaxis.  The pharmacist has reviewed pertinent labs (Hgb _13__; PLT_254__), patient weight (_220__kg) and renal function (CrCl_>90__mL/min) and decided that enoxaparin _100_mg SQ Q24Hrs is appropriate for this patient.  The pharmacy department will sign off at this time.  Please reconsult pharmacy if status changes or for further issues.  Thank you  Luetta Nutting PharmD, BCPS  04/12/2018, 1:39 AM

## 2018-04-12 NOTE — Progress Notes (Signed)
[facility] Progress Note  Patient: [name] KGY:[id number]   PCP: Patient, No Pcp Per DOB: 08-30-1981   DOA: 04/11/2018   DOS: 04/12/2018   Date of Service: the patient was seen and examined on 04/12/2018  Subjective: Feeling better, no acute complaint, no nausea no vomiting.  No pain while walking.  Brief hospital course: Pt. with PMH of morbid obesity, lymphedema, hypertension; admitted on 04/11/2018, presented with complaint of leg ulcers and fever, was found to have cellulitis. Currently further plan is continue IV antibiotics.  Assessment and Plan: 1.  Chronic lymphedema, acute on chronic right leg cellulitis. New onset worsening of the redness involving the right leg. Recently started on Bactrim 2 days ago. Fever and leukocytosis on admission. Met criteria for sepsis. Started on IV vancomycin and Zosyn. Continue monitoring the patient requires pseudomonal coverage although I would continue gram-negative coverage with vancomycin and ceftriaxone. Follow-up on blood cultures. Recent wound cultures from the wound care clinic shows MRSA therefore patient most likely will require Bactrim on discharge. Suspect that patient requires IV antibiotics for poor penetration in the initial phase.  2. Morbid obesity Body mass index is 64.23 kg/m.  Recommended weight loss.  3.  Essential hypertension. Blood pressure stable. Continue home blood pressure medication for now.  Diet: cardiac diet DVT Prophylaxis: subcutaneous Heparin  Advance goals of care discussion: full code  Family Communication: no family was present at bedside, at the time of interview.  Disposition:  Discharge to home.  Consultants: none Procedures: none  Antibiotics: Anti-infectives (From admission, onward)   Start     Dose/Rate Route Frequency Ordered Stop   04/13/18 1300  cefTRIAXone (ROCEPHIN) 2 g in sodium chloride 0.9 % 100 mL IVPB     2 g 200 mL/hr over 30 Minutes Intravenous Every 24 hours  04/12/18 1300     04/12/18 1300  cefTRIAXone (ROCEPHIN) 1 g in sodium chloride 0.9 % 100 mL IVPB  Status:  Discontinued     1 g 200 mL/hr over 30 Minutes Intravenous Every 24 hours 04/12/18 1259 04/12/18 1300   04/12/18 1000  vancomycin (VANCOCIN) 1,750 mg in sodium chloride 0.9 % 500 mL IVPB     1,750 mg 250 mL/hr over 120 Minutes Intravenous Every 12 hours 04/11/18 2342     04/12/18 0100  piperacillin-tazobactam (ZOSYN) IVPB 3.375 g  Status:  Discontinued     3.375 g 12.5 mL/hr over 240 Minutes Intravenous Every 8 hours 04/11/18 2333 04/12/18 1258   04/11/18 2030  vancomycin (VANCOCIN) 1,500 mg in sodium chloride 0.9 % 500 mL IVPB     1,500 mg 250 mL/hr over 120 Minutes Intravenous  Once 04/11/18 1910 04/11/18 2157   04/11/18 1915  piperacillin-tazobactam (ZOSYN) IVPB 3.375 g     3.375 g 100 mL/hr over 30 Minutes Intravenous  Once 04/11/18 1906 04/11/18 2001   04/11/18 1915  vancomycin (VANCOCIN) IVPB 1000 mg/200 mL premix  Status:  Discontinued     1,000 mg 200 mL/hr over 60 Minutes Intravenous  Once 04/11/18 1906 04/12/18 1252       Objective: Physical Exam: Vitals:   04/12/18 0033 04/12/18 0129 04/12/18 0554 04/12/18 1356  BP: 126/74 (!) 131/93 123/79 131/73  Pulse: 99 94 (!) 105 93  Resp: (!) _0 Temp:  98.2 F (36.8 C) 98.2 F (36.8 C) 98.2 F (36.8 C)  TempSrc:  Oral Oral Oral  SpO2: 92% 93% 95% 96%  Weight:  (!) 220.8 kg (486 lb 12.8 oz)  Height:  _0  (1.854 m)      Intake/Output Summary (Last 24 hours) at 04/12/2018 1707 Last data filed at 04/12/2018 1355 Gross per 24 hour  Intake 2570 ml  Output -  Net 2570 ml   Filed Weights   04/12/18 0000 04/12/18 0129  Weight: (!) 221.1 kg (487 lb 6.4 oz) (!) 220.8 kg (486 lb 12.8 oz)   General: Alert, Awake and Oriented to Time, Place and Person. Appear in no distress, affect appropriate Eyes: PERRL, Conjunctiva normal ENT: Oral Mucosa clear moist. Neck: no JVD, no Abnormal Mass Or lumps Cardiovascular:  S1 and S2 Present, no Murmur, Peripheral Pulses Present Respiratory: normal respiratory effort, Bilateral Air entry equal and Decreased, no use of accessory muscle, Clear to Auscultation, no Crackles, no wheezes Abdomen: Bowel Sound present, Soft and no tenderness, no hernia Skin: no redness, no Rash, no induration Extremities: no Pedal edema, no calf tenderness Neurologic: Grossly no focal neuro deficit. Bilaterally Equal motor strength  Data Reviewed: CBC: Recent Labs  Lab 04/11/18 1850 04/12/18 0453  WBC 15.6* 10.8*  NEUTROABS 13.0*  --   HGB 13.0 12.6  HCT 39.2 39.3  MCV 97.8 99.0  PLT 254 440   Basic Metabolic Panel: Recent Labs  Lab 04/11/18 1850 04/12/18 0453  NA 136 138  K 4.0 4.2  CL 103 104  CO2 23 24  GLUCOSE 112* 95  BUN 12 12  CREATININE 1.07* 0.98  CALCIUM 9.1 8.7*    Liver Function Tests: Recent Labs  Lab 04/11/18 1850  AST 18  ALT 13*  ALKPHOS 51  BILITOT 1.2  PROT 8.0  ALBUMIN 3.9   No results for input(s): LIPASE, AMYLASE in the last 168 hours. No results for input(s): AMMONIA in the last 168 hours. Coagulation Profile: Recent Labs  Lab 04/11/18 1850  INR 1.09   Cardiac Enzymes: No results for input(s): CKTOTAL, CKMB, CKMBINDEX, TROPONINI in the last 168 hours. BNP (last 3 results) No results for input(s): PROBNP in the last 8760 hours. CBG: No results for input(s): GLUCAP in the last 168 hours. Studies: Dg Chest 2 View  Result Date: 04/11/2018 CLINICAL DATA:  37 year old female with fever. EXAM: CHEST - 2 VIEW COMPARISON:  08/30/2017 and prior chest radiographs FINDINGS: The cardiomediastinal silhouette is unremarkable. Mild peribronchial thickening is unchanged. There is no evidence of focal airspace disease, pulmonary edema, suspicious pulmonary nodule/mass, pleural effusion, or pneumothorax. No acute bony abnormalities are identified. IMPRESSION: No active cardiopulmonary disease. Electronically Signed   By: Margarette Canada M.D.   On:  04/11/2018 19:19   Dg Tibia/fibula Left  Result Date: 04/11/2018 CLINICAL DATA:  Bilateral LOWER extremity cellulitis. EXAM: LEFT TIBIA AND FIBULA - 2 VIEW COMPARISON:  None. FINDINGS: Diffuse soft tissue swelling noted without gas or radiopaque foreign body. No acute fracture, subluxation, dislocation or osteomyelitis noted. No focal bony lesions are present. IMPRESSION: Diffuse soft tissue swelling without acute bony abnormality. Electronically Signed   By: Margarette Canada M.D.   On: 04/11/2018 20:37   Dg Tibia/fibula Right  Result Date: 04/11/2018 CLINICAL DATA:  RIGHT LOWER extremity cellulitis. Initial encounter. EXAM: RIGHT TIBIA AND FIBULA - 2 VIEW COMPARISON:  None. FINDINGS: Diffuse soft tissue swelling noted without gas or radiopaque foreign body. No acute fracture, subluxation, dislocation or osteomyelitis identified. No focal bony lesions are present. IMPRESSION: Soft tissue swelling without acute bony abnormality. Electronically Signed   By: Margarette Canada M.D.   On: 04/11/2018 20:38    Scheduled Meds: . enoxaparin (LOVENOX)  injection  100 mg Subcutaneous Q24H   Continuous Infusions: . [START ON 04/13/2018] cefTRIAXone (ROCEPHIN)  IV    . vancomycin Stopped (04/12/18 1237)   PRN Meds: acetaminophen **OR** acetaminophen, ondansetron **OR** ondansetron (ZOFRAN) IV  Time spent: 35 minutes  Author: Berle Mull, MD [facility] Pager: [phone] 04/12/2018 5:07 PM  If 7PM-7AM, please contact night-coverage at [email or web address], password [facility]

## 2018-04-12 NOTE — ED Notes (Signed)
Pt has sleep apnea and has been in & out of sleeping

## 2018-04-13 ENCOUNTER — Other Ambulatory Visit: Payer: Self-pay | Admitting: Internal Medicine

## 2018-04-13 DIAGNOSIS — I890 Lymphedema, not elsewhere classified: Secondary | ICD-10-CM

## 2018-04-13 MED ORDER — SULFAMETHOXAZOLE-TRIMETHOPRIM 800-160 MG PO TABS: 2.0000 | ORAL_TABLET | Freq: Two times a day (BID) | ORAL | 0 refills | Status: AC

## 2018-04-13 NOTE — Plan of Care (Signed)
Discharge instructions reviewed with patient, questions answered, verbalized understanding.  Patient given wound care supplies in room for wound care at home until she can get back in to the wound center at [facility].  Patient has Bactrim at home and will restart that this evening.  Patient transported via wheelchair to the emergency room entrance where her car is to drive herself home.

## 2018-04-14 NOTE — Discharge Summary (Signed)
[facility] Discharge Summary   Patient: [name] WJX:[id number]   PCP: Patient, No Pcp Per DOB: February 27, 1981   Date of admission: 04/11/2018   Date of discharge: 04/13/2018    Discharge Diagnoses:  Active Problems:   HTN (hypertension)   Lymphedema of lower extremity   Morbid obesity with BMI of 50.0-59.9, adult ([place])   Cellulitis   Admitted From: home Disposition:  family  Recommendations for Outpatient Follow-up:  1. Continue follow-up with wound care clinic.  If clinically improving can consider reducing the Bactrim from 2 tablets twice daily to 1 tablet twice daily  Follow-up Information    wound care clinic. Schedule an appointment as soon as possible for a visit in 1 week(s).          Diet recommendation: Cardiac diet  Activity: The patient is advised to gradually reintroduce usual activities.  Discharge Condition: good  Code Status: Full code  History of present illness: As per the H and P dictated on admission, " [name] is a 37 y.o. female with medical history significant for morbid obesity, lymphedema lateral lower extremities, HTN, who presented to the ED with complaints of bilateral lower extremity pain worse in the right, and fever-recorded temperature up to 104, that started today.  Patient has chronic bilateral lower extremity ulcers.  She follows with the wound care clinic.  New ulcers were noted on the left lower extremity in addition to the old ones on the right, after the wrappings on her legs were removed, when she followed up at the wound care clinic 04/08/18.  There was a concern for cellulitis, so patient was prescribed Bactrim, which endorses compliance with over the past 2 days, taking twice daily as prescribed.  But with fevers and pain this morning she presented to the ED.  She endorses nausea without vomiting.  No dysuria frequency.  No loose stools no shortness of breath no cough. With Fever of 104 today, patient took ibuprofen prior to  presentation to the ED"  Hospital Course:  Summary of her active problems in the hospital is as following. 1.  Chronic lymphedema, acute on chronic right leg cellulitis. New onset worsening of the redness involving the right leg. Recently started on Bactrim 2 days ago. Fever and leukocytosis on admission. Met criteria for sepsis. Started on IV vancomycin and Zosyn. Blood cultures negative for 48 hours. Recent wound cultures from the wound care clinic shows MRSA. Although this did suggest Bactrim causing itching patient reported me that she did not have any complaints. Therefore will continue Bactrim on discharge.  Dose will be increased from 1 tablet twice daily to 2 tablet twice daily. Patient will follow with clinical clinic. Discussed with ID and recommended to add Keflex to cover strep. Prescription at pharmacy was called and patient was informed about addition of the antibiotic as well.  2. Morbid obesity Body mass index is 64.23 kg/m.  Recommended weight loss.  3.  Essential hypertension. Blood pressure stable. Continue home blood pressure medication for now.   All other chronic medical condition were stable during the hospitalization.  Patient was ambulatory without any assistance. On the day of the discharge the patient's vitals were stable , and no other acute medical condition were reported by patient. the patient was felt safe to be discharge at home with family.  Consultants: none Procedures: none  DISCHARGE MEDICATION: Allergies as of 04/13/2018      Reactions   Bactrim [sulfamethoxazole-trimethoprim] Itching   Sulfa Antibiotics Itching  Medication List    TAKE these medications   ibuprofen 200 MG tablet Commonly known as:  ADVIL,MOTRIN Take 600 mg by mouth every 6 (six) hours as needed for moderate pain.   sulfamethoxazole-trimethoprim 800-160 MG tablet Commonly known as:  BACTRIM DS,SEPTRA DS Take 2 tablets by mouth 2 (two) times daily for 6  days. What changed:  how much to take      Allergies  Allergen Reactions  . Bactrim [Sulfamethoxazole-Trimethoprim] Itching  . Sulfa Antibiotics Itching   Discharge Instructions    Diet - low sodium heart healthy   Complete by:  As directed    Discharge instructions   Complete by:  As directed    It is important that you read following instructions as well as go over your medication list with RN to help you understand your care after this hospitalization.  Discharge Instructions: Please follow-up with PCP in one week  Please request your primary care physician to go over all Hospital Tests and Procedure/Radiological results at the follow up,  Please get all Hospital records sent to your PCP by signing hospital release before you go home.   Do not take more than prescribed Pain, Sleep and Anxiety Medications. You were cared for by a hospitalist during your hospital stay. If you have any questions about your discharge medications or the care you received while you were in the hospital after you are discharged, you can call the unit and ask to speak with the hospitalist on call if the hospitalist that took care of you is not available.  Once you are discharged, your primary care physician will handle any further medical issues. Please note that NO REFILLS for any discharge medications will be authorized once you are discharged, as it is imperative that you return to your primary care physician (or establish a relationship with a primary care physician if you do not have one) for your aftercare needs so that they can reassess your need for medications and monitor your lab values. You Must read complete instructions/literature along with all the possible adverse reactions/side effects for all the Medicines you take and that have been prescribed to you. Take any new Medicines after you have completely understood and accept all the possible adverse reactions/side effects. Wear Seat belts while  driving. If you have smoked or chewed Tobacco in the last 2 yrs please stop smoking and/or stop any Recreational drug use.   Increase activity slowly   Complete by:  As directed      Discharge Exam: Filed Weights   04/12/18 0000 04/12/18 0129  Weight: (!) 221.1 kg (487 lb 6.4 oz) (!) 220.8 kg (486 lb 12.8 oz)   Vitals:   04/12/18 2113 04/13/18 0426  BP: 125/73 119/68  Pulse: 96 84  Resp: 18 18  Temp: 98.8 F (37.1 C) 98.1 F (36.7 C)  SpO2: 96% 94%   General: Appear in no distress, no Rash; Oral Mucosa moist. Cardiovascular: S1 and S2 Present, no Murmur, no JVD Respiratory: Bilateral Air entry present and Clear to Auscultation, no Crackles, no wheezes Abdomen: Bowel Sound present, Soft and no tenderness Extremities: bilateral  Pedal edema, no calf tenderness, bilateral legs are wrapped.  Wound is less yesterday. Neurology: Grossly no focal neuro deficit.  The results of significant diagnostics from this hospitalization (including imaging, microbiology, ancillary and laboratory) are listed below for reference.    Significant Diagnostic Studies: Dg Chest 2 View  Result Date: 04/11/2018 CLINICAL DATA:  37 year old female with fever. EXAM: CHEST -  2 VIEW COMPARISON:  08/30/2017 and prior chest radiographs FINDINGS: The cardiomediastinal silhouette is unremarkable. Mild peribronchial thickening is unchanged. There is no evidence of focal airspace disease, pulmonary edema, suspicious pulmonary nodule/mass, pleural effusion, or pneumothorax. No acute bony abnormalities are identified. IMPRESSION: No active cardiopulmonary disease. Electronically Signed   By: Margarette Canada M.D.   On: 04/11/2018 19:19   Dg Tibia/fibula Left  Result Date: 04/11/2018 CLINICAL DATA:  Bilateral LOWER extremity cellulitis. EXAM: LEFT TIBIA AND FIBULA - 2 VIEW COMPARISON:  None. FINDINGS: Diffuse soft tissue swelling noted without gas or radiopaque foreign body. No acute fracture, subluxation, dislocation or  osteomyelitis noted. No focal bony lesions are present. IMPRESSION: Diffuse soft tissue swelling without acute bony abnormality. Electronically Signed   By: Margarette Canada M.D.   On: 04/11/2018 20:37   Dg Tibia/fibula Right  Result Date: 04/11/2018 CLINICAL DATA:  RIGHT LOWER extremity cellulitis. Initial encounter. EXAM: RIGHT TIBIA AND FIBULA - 2 VIEW COMPARISON:  None. FINDINGS: Diffuse soft tissue swelling noted without gas or radiopaque foreign body. No acute fracture, subluxation, dislocation or osteomyelitis identified. No focal bony lesions are present. IMPRESSION: Soft tissue swelling without acute bony abnormality. Electronically Signed   By: Margarette Canada M.D.   On: 04/11/2018 20:38    Microbiology: Recent Results (from the past 240 hour(s))  Aerobic Culture (superficial specimen)     Status: None   Collection Time: 04/08/18 11:57 AM  Result Value Ref Range Status   Specimen Description   Final    LEG Performed at [facility], [address] [place], [place] [address]    Special Requests   Final    LEG Performed at [facility], [address] [place], [place] [address]    Gram Stain   Final    RARE WBC PRESENT, PREDOMINANTLY MONONUCLEAR MODERATE GRAM POSITIVE COCCI Performed at [facility], [place] [address] [place], [place] [address]    Culture   Final    ABUNDANT METHICILLIN RESISTANT STAPHYLOCOCCUS AUREUS   Report Status 04/10/2018 FINAL  Final   Organism ID, Bacteria METHICILLIN RESISTANT STAPHYLOCOCCUS AUREUS  Final      Susceptibility   Methicillin resistant staphylococcus aureus - MIC*    CIPROFLOXACIN >=8 RESISTANT Resistant     ERYTHROMYCIN >=8 RESISTANT Resistant     GENTAMICIN <=0.5 SENSITIVE Sensitive     OXACILLIN RESISTANT Resistant     TETRACYCLINE >=16 RESISTANT Resistant     VANCOMYCIN <=0.5 SENSITIVE Sensitive     TRIMETH/SULFA <=10 SENSITIVE Sensitive     CLINDAMYCIN >=8 RESISTANT Resistant     RIFAMPIN <=0.5 SENSITIVE  Sensitive     Inducible Clindamycin NEGATIVE Sensitive     * ABUNDANT METHICILLIN RESISTANT STAPHYLOCOCCUS AUREUS  Culture, blood (Routine x 2)     Status: None (Preliminary result)   Collection Time: 04/11/18  6:48 PM  Result Value Ref Range Status   Specimen Description   Final    BLOOD LEFT ANTECUBITAL Performed at [facility] [place] [address] [place], [place] [address]    Special Requests   Final    BOTTLES DRAWN AEROBIC AND ANAEROBIC Blood Culture adequate volume Performed at [place] [address], [place], [place] [address]    Culture   Final    NO GROWTH 2 DAYS Performed at [facility] [place] [address], [place],  [address]    Report Status PENDING  Incomplete  Culture, blood (Routine x 2)  Status: None (Preliminary result)   Collection Time: 04/11/18  6:58 PM  Result Value Ref Range Status   Specimen Description   Final    BLOOD RIGHT ANTECUBITAL Performed at [place] [address] [place], [place] [address]    Special Requests   Final    BOTTLES DRAWN AEROBIC AND ANAEROBIC Blood Culture results may not be optimal due to an excessive volume of blood received in culture bottles Performed at [facility] [address] [place], [place] [address]    Culture   Final    NO GROWTH 2 DAYS Performed at [facility] [address], [place], [place] [address]    Report Status PENDING  Incomplete  Wound or Superficial Culture     Status: None (Preliminary result)   Collection Time: 04/11/18 10:35 PM  Result Value Ref Range Status   Specimen Description   Final    ABSCESS Performed at [facility] [address], [place], [place] [address]    Special Requests   Final    NONE Performed at [facility], [place] [address], [place], [place] [address]    Gram Stain NO WBC SEEN NO ORGANISMS SEEN   Final   Culture   Final     CULTURE REINCUBATED FOR BETTER GROWTH Performed at [facility], [place] [address] [place],  [address]    Report Status PENDING  Incomplete     Labs: CBC: Recent Labs  Lab 04/11/18 1850 04/12/18 0453  WBC 15.6* 10.8*  NEUTROABS 13.0*  --   HGB 13.0 12.6  HCT 39.2 39.3  MCV 97.8 99.0  PLT 254 199   Basic Metabolic Panel: Recent Labs  Lab 04/11/18 1850 04/12/18 0453  NA 136 138  K 4.0 4.2  CL 103 104  CO2 23 24  GLUCOSE 112* 95  BUN 12 12  CREATININE 1.07* 0.98  CALCIUM 9.1 8.7*   Liver Function Tests: Recent Labs  Lab 04/11/18 1850  AST 18  ALT 13*  ALKPHOS 51  BILITOT 1.2  PROT 8.0  ALBUMIN 3.9   No results for input(s): LIPASE, AMYLASE in the last 168 hours. No results for input(s): AMMONIA in the last 168 hours. Cardiac Enzymes: No results for input(s): CKTOTAL, CKMB, CKMBINDEX, TROPONINI in the last 168 hours. BNP (last 3 results) No results for input(s): BNP in the last 8760 hours. CBG: No results for input(s): GLUCAP in the last 168 hours. Time spent: 35 minutes  Signed:  Berle Mull  [facility] 04/13/2018  8:22 AM

## 2018-04-15 LAB — AEROBIC CULTURE  (SUPERFICIAL SPECIMEN)
Culture: NORMAL
Gram Stain: NONE SEEN

## 2018-04-15 LAB — AEROBIC CULTURE W GRAM STAIN (SUPERFICIAL SPECIMEN)

## 2018-04-16 LAB — CULTURE, BLOOD (ROUTINE X 2)
Culture: NO GROWTH
Culture: NO GROWTH
Special Requests: ADEQUATE

## 2018-04-17 ENCOUNTER — Encounter: Payer: Self-pay | Admitting: Internal Medicine

## 2018-04-17 NOTE — Progress Notes (Signed)
[name] ([id number]) Visit Report for 03/28/2018 Arrival Information Details Patient Name: [name], [name] Date of Service: 03/28/2018 2:00 PM Medical Record Number: [id number] Patient Account Number: [id number] Date of Birth/Sex: [birth year] (36 y.o. F) Treating RN: Curtis Sites Primary Care Demetrice Combes: PATIENT, NO Other Clinician: Referring Taila Basinski: Referral, Self Treating Ambriella Kitt/Extender: STONE III, HOYT Weeks in Treatment: 28 Visit Information History Since Last Visit Added or deleted any medications: No Patient Arrived: Ambulatory Any new allergies or adverse reactions: No Arrival Time: 14:14 Had a fall or experienced change in No Accompanied By: self activities of daily living that may affect Transfer Assistance: None risk of falls: Patient Identification Verified: Yes Signs or symptoms of abuse/neglect since last visito No Secondary Verification Process Completed: Yes Hospitalized since last visit: No Patient Requires Transmission-Based No Implantable device outside of the clinic excluding No Precautions: cellular tissue based products placed in the center Patient Has Alerts: No since last visit: Has Dressing in Place as Prescribed: Yes Has Compression in Place as Prescribed: Yes Pain Present Now: No Electronic Signature(s) Signed: 03/28/2018 4:34:09 PM By: Curtis Sites Entered By: Curtis Sites on 03/28/2018 14:15:02 Kirkeby, Cristal Deer ([id number]) -------------------------------------------------------------------------------- Encounter Discharge Information Details Patient Name: Jessica Casey R. Date of Service: 03/28/2018 2:00 PM Medical Record Number: [id number] Patient Account Number: [id number] Date of Birth/Sex: [birth year] (37 y.o. F) Treating RN: Phillis Haggis Primary Care Davinci Glotfelty: PATIENT, NO Other Clinician: Referring Luann Aspinwall: Referral, Self Treating Raylie Maddison/Extender: STONE III, HOYT Weeks in Treatment: 28 Encounter Discharge Information  Items Discharge Condition: Stable Ambulatory Status: Ambulatory Discharge Destination: Home Transportation: Private Auto Accompanied By: self Schedule Follow-up Appointment: Yes Clinical Summary of Care: Electronic Signature(s) Signed: 04/17/2018 7:45:54 AM By: Arnette Norris Entered By: Arnette Norris on 03/28/2018 16:01:48 Nease, Cristal Deer ([id number]) -------------------------------------------------------------------------------- Lower Extremity Assessment Details Patient Name: Jessica Casey R. Date of Service: 03/28/2018 2:00 PM Medical Record Number: [id number] Patient Account Number: [id number] Date of Birth/Sex: 1981/06/17 (37 y.o. F) Treating RN: Curtis Sites Primary Care Jru Pense: PATIENT, NO Other Clinician: Referring Jerrell Hart: Referral, Self Treating Jaliya Siegmann/Extender: STONE III, HOYT Weeks in Treatment: 28 Edema Assessment Assessed: [Left: No] [Right: No] [Left: Edema] [Right: :] Calf Left: Right: Point of Measurement: 35 cm From Medial Instep cm 56.3 cm Ankle Left: Right: Point of Measurement: 9 cm From Medial Instep cm 35 cm Vascular Assessment Pulses: Dorsalis Pedis Palpable: [Right:Yes] Posterior Tibial Extremity colors, hair growth, and conditions: Extremity Color: [Right:Hyperpigmented] Hair Growth on Extremity: [Right:Yes] Temperature of Extremity: [Right:Warm] Capillary Refill: [Right:< 3 seconds] Toe Nail Assessment Left: Right: Thick: Yes Discolored: Yes Deformed: Yes Improper Length and Hygiene: Yes Electronic Signature(s) Signed: 03/28/2018 4:34:09 PM By: Curtis Sites Entered By: Curtis Sites on 03/28/2018 14:22:50 Garton, Lasundra R. ([id number]) -------------------------------------------------------------------------------- Multi Wound Chart Details Patient Name: Jessica Casey R. Date of Service: 03/28/2018 2:00 PM Medical Record Number: [id number] Patient Account Number: [id number] Date of Birth/Sex: January 12, 1981 (37 y.o. F) Treating RN:  Phillis Haggis Primary Care Legion Discher: PATIENT, NO Other Clinician: Referring Dvon Jiles: Referral, Self Treating Airlie Blumenberg/Extender: STONE III, HOYT Weeks in Treatment: 28 Vital Signs Height(in): 74 Pulse(bpm): 114 Weight(lbs): 505 Blood Pressure(mmHg): 134/82 Body Mass Index(BMI): 65 Temperature(F): 98.2 Respiratory Rate 20 (breaths/min): Photos: [2:No Photos] [N/A:N/A] Wound Location: [2:Right Lower Leg - Lateral, Distal] [N/A:N/A] Wounding Event: [2:Gradually Appeared] [N/A:N/A] Primary Etiology: [2:Lymphedema] [N/A:N/A] Comorbid History: [2:Lymphedema, Hypertension] [N/A:N/A] Date Acquired: [2:08/08/2017] [N/A:N/A] Weeks of Treatment: [2:28] [N/A:N/A] Wound Status: [2:Open] [N/A:N/A] Clustered Wound: [2:Yes] [N/A:N/A] Clustered Quantity: [2:2] [N/A:N/A] Measurements L x W x D [2:1.1x0.4x0.2] [N/A:N/A] (cm) Area (cm) : [  2:0.346] [N/A:N/A] Volume (cm) : [2:0.069] [N/A:N/A] % Reduction in Area: [2:98.40%] [N/A:N/A] % Reduction in Volume: [2:98.90%] [N/A:N/A] Classification: [2:Full Thickness With Exposed Support Structures] [N/A:N/A] Exudate Amount: [2:Small] [N/A:N/A] Exudate Type: [2:Serosanguineous] [N/A:N/A] Exudate Color: [2:red, brown] [N/A:N/A] Wound Margin: [2:Flat and Intact] [N/A:N/A] Granulation Amount: [2:Large (67-100%)] [N/A:N/A] Granulation Quality: [2:Red] [N/A:N/A] Necrotic Amount: [2:None Present (0%)] [N/A:N/A] Exposed Structures: [2:Fat Layer (Subcutaneous Tissue) Exposed: Yes Fascia: No Tendon: No Muscle: No Joint: No Bone: No] [N/A:N/A] Epithelialization: [2:Medium (34-66%)] [N/A:N/A] Periwound Skin Texture: [2:Excoriation: No Induration: No Callus: No] [N/A:N/A] Crepitus: No Rash: No Scarring: No Periwound Skin Moisture: Dry/Scaly: Yes N/A N/A Maceration: No Periwound Skin Color: Atrophie Blanche: No N/A N/A Cyanosis: No Ecchymosis: No Erythema: No Hemosiderin Staining: No Mottled: No Pallor: No Rubor: No Temperature: No  Abnormality N/A N/A Tenderness on Palpation: Yes N/A N/A Wound Preparation: Ulcer Cleansing: N/A N/A Rinsed/Irrigated with Saline, Other: soap and water Topical Anesthetic Applied: Other: lidocaine 4% Treatment Notes Electronic Signature(s) Signed: 03/29/2018 4:00:06 PM By: [name] Entered By: [name] on 03/28/2018 14:33:59 [name] ([id number]) -------------------------------------------------------------------------------- Multi-Disciplinary Care Plan Details Patient Name: Jessica Casey R. Date of Service: 03/28/2018 2:00 PM Medical Record Number: [id number] Patient Account Number: [id number] Date of Birth/Sex: 04/11/1981 (37 y.o. F) Treating RN: Phillis Haggis Primary Care Coryn Mosso: PATIENT, NO Other Clinician: Referring Maisley Hainsworth: Referral, Self Treating Joban Colledge/Extender: STONE III, HOYT Weeks in Treatment: 28 Active Inactive ` Orientation to the Wound Care Program Nursing Diagnoses: Knowledge deficit related to the wound healing center program Goals: Patient/caregiver will verbalize understanding of the Wound Healing Center Program Date Initiated: 09/10/2017 Target Resolution Date: 11/23/2017 Goal Status: Active Interventions: Provide education on orientation to the wound center Notes: ` Venous Leg Ulcer Nursing Diagnoses: Knowledge deficit related to disease process and management Goals: Patient/caregiver will verbalize understanding of disease process and disease management Date Initiated: 09/10/2017 Target Resolution Date: 11/23/2017 Goal Status: Active Interventions: Assess peripheral edema status every visit. Notes: ` Wound/Skin Impairment Nursing Diagnoses: Impaired tissue integrity Goals: Ulcer/skin breakdown will heal within 14 weeks Date Initiated: 09/10/2017 Target Resolution Date: 11/24/2017 Goal Status: Active Interventions: CAILEY, TRIGUEROS ([id number]) Assess patient/caregiver ability to obtain necessary supplies Assess  patient/caregiver ability to perform ulcer/skin care regimen upon admission and as needed Assess ulceration(s) every visit Notes: Electronic Signature(s) Signed: 03/29/2018 4:00:06 PM By: [name] Entered By: [name] on 03/28/2018 14:33:49 Ailes, Natalia R. ([id number]) -------------------------------------------------------------------------------- Pain Assessment Details Patient Name: Jessica Casey R. Date of Service: 03/28/2018 2:00 PM Medical Record Number: [id number] Patient Account Number: [id number] Date of Birth/Sex: 1981/03/29 (37 y.o. F) Treating RN: Curtis Sites Primary Care Rory Montel: PATIENT, NO Other Clinician: Referring Densel Kronick: Referral, Self Treating Deshae Dickison/Extender: STONE III, HOYT Weeks in Treatment: 28 Active Problems Location of Pain Severity and Description of Pain Patient Has Paino No Site Locations Pain Management and Medication Current Pain Management: Electronic Signature(s) Signed: 03/28/2018 4:34:09 PM By: Curtis Sites Entered By: Curtis Sites on 03/28/2018 14:15:09 [name] ([id number]) -------------------------------------------------------------------------------- Patient/Caregiver Education Details Patient Name: Jessica Casey R. Date of Service: 03/28/2018 2:00 PM Medical Record Number: [id number] Patient Account Number: [id number] Date of Birth/Gender: 17-Dec-1980 (37 y.o. F) Treating RN: Phillis Haggis Primary Care Physician: PATIENT, NO Other Clinician: Referring Physician: Referral, Self Treating Physician/Extender: Linwood Dibbles, HOYT Weeks in Treatment: 28 Education Assessment Education Provided To: Patient Education Topics Provided Wound/Skin Impairment: Handouts: Caring for Your Ulcer Methods: Explain/Verbal Responses: State content correctly Electronic Signature(s) Signed: 04/17/2018 7:45:54 AM By: Arnette Norris Entered By: Arnette Norris on 03/28/2018 16:02:05  JENNELL, JANOSIK RMarland Kitchen  ([id number]) -------------------------------------------------------------------------------- Wound Assessment Details Patient Name: YULEIMY, KRETZ. Date of Service: 03/28/2018 2:00 PM Medical Record Number: [id number] Patient Account Number: [id number] Date of Birth/Sex: 02-06-1981 (37 y.o. F) Treating RN: Curtis Sites Primary Care Kyah Buesing: PATIENT, NO Other Clinician: Referring Marillyn Goren: Referral, Self Treating Shizuye Rupert/Extender: STONE III, HOYT Weeks in Treatment: 28 Wound Status Wound Number: 2 Primary Etiology: Lymphedema Wound Location: Right Lower Leg - Lateral, Distal Wound Status: Open Wounding Event: Gradually Appeared Comorbid History: Lymphedema, Hypertension Date Acquired: 08/08/2017 Weeks Of Treatment: 28 Clustered Wound: Yes Photos Photo Uploaded By: Elliot Gurney, BSN, RN, CWS, Kim on 03/28/2018 16:55:54 Wound Measurements Length: (cm) 1.1 % Reduct Width: (cm) 0.4 % Reduct Depth: (cm) 0.2 Epitheli Clustered Quantity: 2 Tunnelin Area: (cm) 0.346 Undermi Volume: (cm) 0.069 ion in Area: 98.4% ion in Volume: 98.9% alization: Medium (34-66%) g: No ning: No Wound Description Full Thickness With Exposed Support Classification: Structures Wound Margin: Flat and Intact Exudate Small Amount: Exudate Type: Serosanguineous Exudate Color: red, brown Foul Odor After Cleansing: No Slough/Fibrino No Wound Bed Granulation Amount: Large (67-100%) Exposed Structure Granulation Quality: Red Fascia Exposed: No Necrotic Amount: None Present (0%) Fat Layer (Subcutaneous Tissue) Exposed: Yes Tendon Exposed: No Muscle Exposed: No Joint Exposed: No Sak, Mertha R. ([id number]) Bone Exposed: No Periwound Skin Texture Texture Color No Abnormalities Noted: No No Abnormalities Noted: No Callus: No Atrophie Blanche: No Crepitus: No Cyanosis: No Excoriation: No Ecchymosis: No Induration: No Erythema: No Rash: No Hemosiderin Staining: No Scarring: No Mottled:  No Pallor: No Moisture Rubor: No No Abnormalities Noted: No Dry / Scaly: Yes Temperature / Pain Maceration: No Temperature: No Abnormality Tenderness on Palpation: Yes Wound Preparation Ulcer Cleansing: Rinsed/Irrigated with Saline, Other: soap and water, Topical Anesthetic Applied: Other: lidocaine 4%, Electronic Signature(s) Signed: 03/28/2018 4:34:09 PM By: Curtis Sites Entered By: Curtis Sites on 03/28/2018 14:26:05 [name] ([id number]) -------------------------------------------------------------------------------- Vitals Details Patient Name: Jessica Casey R. Date of Service: 03/28/2018 2:00 PM Medical Record Number: [id number] Patient Account Number: [id number] Date of Birth/Sex: 06/14/1981 (37 y.o. F) Treating RN: Curtis Sites Primary Care Shella Lahman: PATIENT, NO Other Clinician: Referring Muriah Harsha: Referral, Self Treating Odies Desa/Extender: STONE III, HOYT Weeks in Treatment: 28 Vital Signs Time Taken: 14:16 Temperature (F): 98.2 Height (in): 74 Pulse (bpm): 114 Weight (lbs): 505 Respiratory Rate (breaths/min): 20 Body Mass Index (BMI): 64.8 Blood Pressure (mmHg): 134/82 Reference Range: 80 - 120 mg / dl Electronic Signature(s) Signed: 03/28/2018 4:34:09 PM By: Curtis Sites Entered By: Curtis Sites on 03/28/2018 14:16:33

## 2018-04-19 NOTE — Progress Notes (Addendum)
ROSCHELLE, CALANDRA ([id number]) Visit Report for 04/17/2018 Arrival Information Details Patient Name: [name], [name] Date of Service: 04/17/2018 9:00 AM Medical Record Number: [id number] Patient Account Number: [id number] Date of Birth/Sex: 1981-10-09 (37 y.o. F) Treating RN: Renne Crigler Primary Care Jaeda Bruso: PATIENT, NO Other Clinician: Referring Ahlivia Salahuddin: Referral, Self Treating Michaiah Holsopple/Extender: Altamese [place] in Treatment: 31 Visit Information History Since Last Visit All ordered tests and consults were completed: No Patient Arrived: Ambulatory Added or deleted any medications: Yes Arrival Time: 08:59 Any new allergies or adverse reactions: No Accompanied By: self Had a fall or experienced change in No Transfer Assistance: None activities of daily living that may affect Patient Identification Verified: Yes risk of falls: Secondary Verification Process Completed: Yes Signs or symptoms of abuse/neglect since last visito No Patient Requires Transmission-Based No Hospitalized since last visit: Yes Precautions: Implantable device outside of the clinic excluding No Patient Has Alerts: No cellular tissue based products placed in the center since last visit: Pain Present Now: Yes Electronic Signature(s) Signed: 04/17/2018 4:15:53 PM By: Renne Crigler Entered By: Renne Crigler on 04/17/2018 09:06:32 [name], [name] ([id number]) -------------------------------------------------------------------------------- Clinic Level of Care Assessment Details Patient Name: Jessica Casey R. Date of Service: 04/17/2018 9:00 AM Medical Record Number: [id number] Patient Account Number: [id number] Date of Birth/Sex: 08-Nov-1981 (37 y.o. F) Treating RN: Renne Crigler Primary Care Maciel Kegg: PATIENT, NO Other Clinician: Referring Nettie Wyffels: Referral, Self Treating Jamesen Stahnke/Extender: Altamese [place] in Treatment: 31 Clinic Level of Care Assessment Items TOOL 4 Quantity  Score X - Use when only an EandM is performed on FOLLOW-UP visit 1 0 ASSESSMENTS - Nursing Assessment / Reassessment X - Reassessment of Co-morbidities (includes updates in patient status) 1 10 X- 1 5 Reassessment of Adherence to Treatment Plan ASSESSMENTS - Wound and Skin Assessment / Reassessment  - Simple Wound Assessment / Reassessment - one wound 0 X- 5 5 Complex Wound Assessment / Reassessment - multiple wounds  - 0 Dermatologic / Skin Assessment (not related to wound area) ASSESSMENTS - Focused Assessment X - Circumferential Edema Measurements - multi extremities 2 5  - 0 Nutritional Assessment / Counseling / Intervention  - 0 Lower Extremity Assessment (monofilament, tuning fork, pulses)  - 0 Peripheral Arterial Disease Assessment (using hand held doppler) ASSESSMENTS - Ostomy and/or Continence Assessment and Care  - Incontinence Assessment and Management 0  - 0 Ostomy Care Assessment and Management (repouching, etc.) PROCESS - Coordination of Care  - Simple Patient / Family Education for ongoing care 0 X- 1 20 Complex (extensive) Patient / Family Education for ongoing care  - 0 Staff obtains Chiropractor, Records, Test Results / Process Orders  - 0 Staff telephones HHA, Nursing Homes / Clarify orders / etc  - 0 Routine Transfer to another Facility (non-emergent condition)  - 0 Routine Hospital Admission (non-emergent condition)  - 0 New Admissions / Manufacturing engineer / Ordering NPWT, Apligraf, etc.  - 0 Emergency Hospital Admission (emergent condition)  - 0 Simple Discharge Coordination Labreck, Katelyne R. ([id number]) X- 1 15 Complex (extensive) Discharge Coordination PROCESS - Special Needs  - Pediatric / Minor Patient Management 0  - 0 Isolation Patient Management  - 0 Hearing / Language / Visual special needs  - 0 Assessment of Community assistance (transportation, D/C planning, etc.)  - 0 Additional assistance /  Altered mentation  - 0 Support Surface(s) Assessment (bed, cushion, seat, etc.) INTERVENTIONS - Wound Cleansing / Measurement  - Simple Wound Cleansing - one wound 0 X- 5 5 Complex Wound  Cleansing - multiple wounds X- 1 5 Wound Imaging (photographs - any number of wounds) []  - 0 Wound Tracing (instead of photographs) []  - 0 Simple Wound Measurement - one wound X- 5 5 Complex Wound Measurement - multiple wounds INTERVENTIONS - Wound Dressings []  - Small Wound Dressing one or multiple wounds 0 X- 5 15 Medium Wound Dressing one or multiple wounds []  - 0 Large Wound Dressing one or multiple wounds []  - 0 Application of Medications - topical []  - 0 Application of Medications - injection INTERVENTIONS - Miscellaneous []  - External ear exam 0 []  - 0 Specimen Collection (cultures, biopsies, blood, body fluids, etc.) []  - 0 Specimen(s) / Culture(s) sent or taken to Lab for analysis []  - 0 Patient Transfer (multiple staff / Nurse, adult / Similar devices) []  - 0 Simple Staple / Suture removal (25 or less) []  - 0 Complex Staple / Suture removal (26 or more) []  - 0 Hypo / Hyperglycemic Management (close monitor of Blood Glucose) []  - 0 Ankle / Brachial Index (ABI) - do not check if billed separately X- 1 5 Vital Signs [name], [name] ([id number]) Has the patient been seen at the hospital within the last three years: Yes Total Score: 220 Level Of Care: New/Established - Level 5 Electronic Signature(s) Signed: 04/17/2018 4:15:53 PM By: Renne Crigler Entered By: Renne Crigler on 04/17/2018 09:51:24 [name], [name] ([id number]) -------------------------------------------------------------------------------- Encounter Discharge Information Details Patient Name: Jessica Casey R. Date of Service: 04/17/2018 9:00 AM Medical Record Number: [id number] Patient Account Number: [id number] Date of Birth/Sex: 1981/04/02 (37 y.o. F) Treating RN: Curtis Sites Primary Care Gaylen Pereira:  PATIENT, NO Other Clinician: Referring Avory Mimbs: Referral, Self Treating Toshiyuki Fredell/Extender: Altamese [place] in Treatment: 31 Encounter Discharge Information Items Discharge Condition: Stable Ambulatory Status: Ambulatory Discharge Destination: Home Transportation: Private Auto Accompanied By: self Schedule Follow-up Appointment: Yes Clinical Summary of Care: Electronic Signature(s) Signed: 04/17/2018 12:00:30 PM By: Curtis Sites Entered By: Curtis Sites on 04/17/2018 12:00:29 [name] ([id number]) -------------------------------------------------------------------------------- Lower Extremity Assessment Details Patient Name: Jessica Casey R. Date of Service: 04/17/2018 9:00 AM Medical Record Number: [id number] Patient Account Number: [id number] Date of Birth/Sex: Aug 08, 1981 (37 y.o. F) Treating RN: Huel Coventry Primary Care Kaelon Weekes: PATIENT, NO Other Clinician: Referring Merla Sawka: Referral, Self Treating Stephenie Navejas/Extender: Altamese [place] in Treatment: 31 Edema Assessment Assessed: [Left: Yes] [Right: Yes] [Left: Edema] [Right: :] Calf Left: Right: Point of Measurement: 36 cm From Medial Instep 60.5 cm 63.5 cm Ankle Left: Right: Point of Measurement: 9 cm From Medial Instep 38 cm 41 cm Vascular Assessment Pulses: Dorsalis Pedis Palpable: [Left:Yes] [Right:Yes] Posterior Tibial Extremity colors, hair growth, and conditions: Extremity Color: [Left:Hyperpigmented] [Right:Hyperpigmented] Hair Growth on Extremity: [Left:No] [Right:No] Temperature of Extremity: [Left:Warm] [Right:Warm] Capillary Refill: [Left:< 3 seconds] [Right:< 3 seconds] Blood Pressure: Brachial: [Left:100] [Right:100] Dorsalis Pedis: 156 [Left:Dorsalis Pedis: 156] Ankle: Posterior Tibial: 148 [Left:Posterior Tibial: 1.56] [Right:1.56] Toe Nail Assessment Left: Right: Thick: Yes Yes Discolored: Yes Yes Deformed: Yes Yes Improper Length and Hygiene: Yes Yes Electronic  Signature(s) Signed: 04/19/2018 5:30:19 PM By: Elliot Gurney, BSN, RN, CWS, Kim RN, BSN Signed: 04/19/2018 5:38:20 PM By: [name] Previous Signature: 04/17/2018 4:15:53 PM Version By: Renne Crigler Previous Signature: 04/17/2018 5:21:30 PM Version By: Elliot Gurney, BSN, RN, CWS, Kim RN, BSN Previous Signature: 04/17/2018 9:28:15 AM Version By: Elliot Gurney, BSN, RN, CWS, Kim RN, BSN Entered By: [name] on 04/19/2018 08:40:38 [name], [name] ([id number]) [name], [name] ([id number]) -------------------------------------------------------------------------------- Multi Wound Chart Details Patient Name: AINARA, ELDRIDGE R.  Date of Service: 04/17/2018 9:00 AM Medical Record Number: [id number] Patient Account Number: [id number] Date of Birth/Sex: [birth year] (37 y.o. F) Treating RN: Renne Crigler Primary Care Quinlynn Cuthbert: PATIENT, NO Other Clinician: Referring Teghan Philbin: Referral, Self Treating Stefan Karen/Extender: Altamese [place] in Treatment: 31 Vital Signs Height(in): 74 Pulse(bpm): 86 Weight(lbs): 505 Blood Pressure(mmHg): 131/80 Body Mass Index(BMI): 65 Temperature(F): 98.6 Respiratory Rate 16 (breaths/min): Photos: [2:No Photos] [3:No Photos] [4:No Photos] Wound Location: [2:Right Lower Leg - Lateral, Distal] [3:Right Lower Leg - Anterior] [4:Right Lower Leg - Lateral, Distal] Wounding Event: [2:Gradually Appeared] [3:Gradually Appeared] [4:Gradually Appeared] Primary Etiology: [2:Lymphedema] [3:Lymphedema] [4:Lymphedema] Comorbid History: [2:Lymphedema, Hypertension] [3:Lymphedema, Hypertension] [4:Lymphedema, Hypertension] Date Acquired: [2:08/08/2017] [3:04/07/2018] [4:04/07/2018] Weeks of Treatment: [2:31] [3:1] [4:1] Wound Status: [2:Open] [3:Open] [4:Open] Clustered Wound: [2:Yes] [3:No] [4:No] Measurements L x W x D [2:1.2x1.5x0.1] [3:7x7.5x0.1] [4:1.2x1x0.1] (cm) Area (cm) : [2:1.414] [3:41.233] [4:0.942] Volume (cm) : [2:0.141] [3:4.123] [4:0.094] % Reduction in Area:  [2:93.40%] [3:-8097.40%] [4:92.00%] % Reduction in Volume: [2:97.80%] [3:-8146.00%] [4:92.00%] Classification: [2:Full Thickness With Exposed Support Structures] [3:Partial Thickness] [4:Partial Thickness] Exudate Amount: [2:Large] [3:Large] [4:Large] Exudate Type: [2:Serous] [3:Serous] [4:Serous] Exudate Color: [2:amber] [3:amber] [4:amber] Wound Margin: [2:Indistinct, nonvisible] [3:Flat and Intact] [4:Flat and Intact] Granulation Amount: [2:Medium (34-66%)] [3:Medium (34-66%)] [4:Medium (34-66%)] Granulation Quality: [2:Red, Pink] [3:Red, Pink] [4:Red, Pink] Necrotic Amount: [2:Medium (34-66%)] [3:Medium (34-66%)] [4:Medium (34-66%)] Necrotic Tissue: [2:Eschar, Adherent Slough] [3:Adherent Slough] [4:Adherent Slough] Exposed Structures: [2:Fascia: No Fat Layer (Subcutaneous Tissue) Exposed: No Tendon: No Muscle: No Joint: No Bone: No] [3:Fat Layer (Subcutaneous Tissue) Exposed: Yes Fascia: No Tendon: No Muscle: No Joint: No Bone: No] [4:Fat Layer (Subcutaneous Tissue) Exposed: Yes  Fascia: No Tendon: No Muscle: No Joint: No Bone: No] Epithelialization: [2:None] [3:Large (67-100%)] [4:Large (67-100%)] Periwound Skin Texture: [2:Excoriation: Yes Induration: No Callus: No] [3:Excoriation: Yes Induration: No Callus: No] [4:Excoriation: No Induration: No Callus: No] Crepitus: No Crepitus: No Crepitus: No Rash: No Rash: No Rash: No Scarring: No Scarring: No Scarring: No Periwound Skin Moisture: Maceration: Yes Maceration: Yes Maceration: No Dry/Scaly: No Dry/Scaly: No Dry/Scaly: No Periwound Skin Color: Atrophie Blanche: No Atrophie Blanche: No Atrophie Blanche: No Cyanosis: No Cyanosis: No Cyanosis: No Ecchymosis: No Ecchymosis: No Ecchymosis: No Erythema: No Erythema: No Erythema: No Hemosiderin Staining: No Hemosiderin Staining: No Hemosiderin Staining: No Mottled: No Mottled: No Mottled: No Pallor: No Pallor: No Pallor: No Rubor: No Rubor: No Rubor:  No Temperature: No Abnormality No Abnormality No Abnormality Tenderness on Palpation: Yes Yes Yes Wound Preparation: Ulcer Cleansing: Ulcer Cleansing: Ulcer Cleansing: Rinsed/Irrigated with Saline Rinsed/Irrigated with Saline Rinsed/Irrigated with Saline Topical Anesthetic Applied: Topical Anesthetic Applied: Topical Anesthetic Applied: Other: lidocaine 4% None, Other: lidocaine 4% None, Other: lidocaine 4% Wound Number: 5 [id number] N/A Photos: No Photos No Photos N/A Wound Location: Left Lower Leg - Anterior Left Lower Leg N/A Wounding Event: Gradually Appeared Gradually Appeared N/A Primary Etiology: Lymphedema Lymphedema N/A Comorbid History: Lymphedema, Hypertension Lymphedema, Hypertension N/A Date Acquired: 04/07/2018 03/31/2018 N/A Weeks of Treatment: 1 1 N/A Wound Status: Open Open N/A Clustered Wound: No No N/A Measurements L x W x D 2.5x3x0.1 1x1x0.1 N/A (cm) Area (cm) : 5.89 0.785 N/A Volume (cm) : 0.589 0.079 N/A % Reduction in Area: 23.50% 24.30% N/A % Reduction in Volume: 84.70% 61.80% N/A Classification: Full Thickness Without Full Thickness Without N/A Exposed Support Structures Exposed Support Structures Exudate Amount: Large Medium N/A Exudate Type: Serosanguineous Serosanguineous N/A Exudate Color: red, brown red, brown N/A Wound Margin: Flat and Intact Flat and Intact N/A Granulation Amount: Small (  1-33%) Medium (34-66%) N/A Granulation Quality: Red, Hyper-granulation Red N/A Necrotic Amount: Large (67-100%) Medium (34-66%) N/A Necrotic Tissue: Eschar, Adherent Slough Eschar, Adherent Slough N/A Exposed Structures: Fat Layer (Subcutaneous Fat Layer (Subcutaneous N/A Tissue) Exposed: Yes Tissue) Exposed: Yes Fascia: No Fascia: No Tendon: No Tendon: No Muscle: No Muscle: No Joint: No Joint: No Bone: No Bone: No Epithelialization: Large (67-100%) Small (1-33%) N/A Periwound Skin Texture: Excoriation: Yes Excoriation: No N/A Induration: No Induration:  No Mccreedy, Tahirah R. ([id number]) Callus: No Callus: No Crepitus: No Crepitus: No Rash: No Rash: No Scarring: No Scarring: No Periwound Skin Moisture: Maceration: No Maceration: No N/A Dry/Scaly: No Dry/Scaly: No Periwound Skin Color: Atrophie Blanche: No Hemosiderin Staining: Yes N/A Cyanosis: No Atrophie Blanche: No Ecchymosis: No Cyanosis: No Erythema: No Ecchymosis: No Hemosiderin Staining: No Erythema: No Mottled: No Mottled: No Pallor: No Pallor: No Rubor: No Rubor: No Temperature: No Abnormality N/A N/A Tenderness on Palpation: Yes No N/A Wound Preparation: Ulcer Cleansing: Ulcer Cleansing: N/A Rinsed/Irrigated with Saline Rinsed/Irrigated with Saline Topical Anesthetic Applied: Topical Anesthetic Applied: Other: lidocaine 4% Other: lidocaine 4% Treatment Notes Electronic Signature(s) Signed: 04/18/2018 8:24:56 AM By: Baltazar Najjar MD Entered By: Baltazar Najjar on 04/17/2018 09:48:23 [name] ([id number]) -------------------------------------------------------------------------------- Multi-Disciplinary Care Plan Details Patient Name: Jessica Casey R. Date of Service: 04/17/2018 9:00 AM Medical Record Number: [id number] Patient Account Number: [id number] Date of Birth/Sex: May 11, 1981 (37 y.o. F) Treating RN: Renne Crigler Primary Care Lailee Hoelzel: PATIENT, NO Other Clinician: Referring Daishia Fetterly: Referral, Self Treating Juanantonio Stolar/Extender: Altamese [place] in Treatment: 31 Active Inactive ` Orientation to the Wound Care Program Nursing Diagnoses: Knowledge deficit related to the wound healing center program Goals: Patient/caregiver will verbalize understanding of the Wound Healing Center Program Date Initiated: 09/10/2017 Target Resolution Date: 11/23/2017 Goal Status: Active Interventions: Provide education on orientation to the wound center Notes: ` Venous Leg Ulcer Nursing Diagnoses: Knowledge deficit related to disease process  and management Goals: Patient/caregiver will verbalize understanding of disease process and disease management Date Initiated: 09/10/2017 Target Resolution Date: 11/23/2017 Goal Status: Active Interventions: Assess peripheral edema status every visit. Notes: ` Wound/Skin Impairment Nursing Diagnoses: Impaired tissue integrity Goals: Ulcer/skin breakdown will heal within 14 weeks Date Initiated: 09/10/2017 Target Resolution Date: 11/24/2017 Goal Status: Active Interventions: WISDOM, SEYBOLD ([id number]) Assess patient/caregiver ability to obtain necessary supplies Assess patient/caregiver ability to perform ulcer/skin care regimen upon admission and as needed Assess ulceration(s) every visit Notes: Electronic Signature(s) Signed: 04/17/2018 4:15:53 PM By: Renne Crigler Entered By: Renne Crigler on 04/17/2018 09:39:04 [place], Zoee R. ([id number]) -------------------------------------------------------------------------------- Pain Assessment Details Patient Name: Jessica Casey R. Date of Service: 04/17/2018 9:00 AM Medical Record Number: [id number] Patient Account Number: [id number] Date of Birth/Sex: 1981/11/03 (37 y.o. F) Treating RN: Renne Crigler Primary Care Ruhan Borak: PATIENT, NO Other Clinician: Referring Maccoy Haubner: Referral, Self Treating Ehan Freas/Extender: Altamese  in Treatment: 31 Active Problems Location of Pain Severity and Description of Pain Patient Has Paino Yes Site Locations Pain Location: Pain in Ulcers Duration of the Pain. Constant / Intermittento Intermittent Rate the pain. Current Pain Level: 7 Character of Pain Describe the Pain: Aching, Burning Pain Management and Medication Current Pain Management: Electronic Signature(s) Signed: 04/17/2018 4:15:53 PM By: Renne Crigler Entered By: Renne Crigler on 04/17/2018 09:06:49 Nolet, Cristal Deer  ([id number]) -------------------------------------------------------------------------------- Patient/Caregiver Education Details Patient Name: Jessica Casey R. Date of Service: 04/17/2018 9:00 AM Medical Record Number: [id number] Patient Account Number: [id number] Date of Birth/Gender: November 27, 1980 (37 y.o. F) Treating RN: Curtis Sites Primary Care Physician:  PATIENT, NO Other Clinician: Referring Physician: Referral, Self Treating Physician/Extender: Altamese [place] in Treatment: 31 Education Assessment Education Provided To: Patient Education Topics Provided Venous: Handouts: Other: leg elevation Methods: Explain/Verbal Responses: State content correctly Electronic Signature(s) Signed: 04/17/2018 1:57:55 PM By: Curtis Sites Entered By: Curtis Sites on 04/17/2018 12:01:39 Stiehl, Destyne R. ([id number]) -------------------------------------------------------------------------------- Wound Assessment Details Patient Name: Jessica Casey R. Date of Service: 04/17/2018 9:00 AM Medical Record Number: [id number] Patient Account Number: [id number] Date of Birth/Sex: [birth year] (37 y.o. F) Treating RN: Renne Crigler Primary Care Saladin Petrelli: PATIENT, NO Other Clinician: Referring Ellyson Rarick: Referral, Self Treating Dixie Jafri/Extender: Maxwell Caul Weeks in Treatment: 31 Wound Status Wound Number: 2 Primary Etiology: Lymphedema Wound Location: Right Lower Leg - Lateral, Distal Wound Status: Open Wounding Event: Gradually Appeared Comorbid History: Lymphedema, Hypertension Date Acquired: 08/08/2017 Weeks Of Treatment: 31 Clustered Wound: Yes Photos Photo Uploaded By: Renne Crigler on 04/17/2018 16:20:00 Wound Measurements Length: (cm) 1.2 Width: (cm) 1.5 Depth: (cm) 0.1 Area: (cm) 1.414 Volume: (cm) 0.141 % Reduction in Area: 93.4% % Reduction in Volume: 97.8% Epithelialization: None Tunneling: No Undermining: No Wound Description Full Thickness With Exposed  Support Classification: Structures Wound Margin: Indistinct, nonvisible Exudate Large Amount: Exudate Type: Serous Exudate Color: amber Foul Odor After Cleansing: No Slough/Fibrino Yes Wound Bed Granulation Amount: Medium (34-66%) Exposed Structure Granulation Quality: Red, Pink Fascia Exposed: No Necrotic Amount: Medium (34-66%) Fat Layer (Subcutaneous Tissue) Exposed: No Necrotic Quality: Eschar, Adherent Slough Tendon Exposed: No Muscle Exposed: No Joint Exposed: No Bone Exposed: No [name], [name] ([id number]) Periwound Skin Texture Texture Color No Abnormalities Noted: No No Abnormalities Noted: No Callus: No Atrophie Blanche: No Crepitus: No Cyanosis: No Excoriation: Yes Ecchymosis: No Induration: No Erythema: No Rash: No Hemosiderin Staining: No Scarring: No Mottled: No Pallor: No Moisture Rubor: No No Abnormalities Noted: No Dry / Scaly: No Temperature / Pain Maceration: Yes Temperature: No Abnormality Tenderness on Palpation: Yes Wound Preparation Ulcer Cleansing: Rinsed/Irrigated with Saline Topical Anesthetic Applied: Other: lidocaine 4%, Electronic Signature(s) Signed: 04/17/2018 4:15:53 PM By: Renne Crigler Entered By: Renne Crigler on 04/17/2018 09:26:51 [name], [name] Kitchen ([id number]) -------------------------------------------------------------------------------- Wound Assessment Details Patient Name: Jessica Casey R. Date of Service: 04/17/2018 9:00 AM Medical Record Number: [id number] Patient Account Number: [id number] Date of Birth/Sex: [birth year] (37 y.o. F) Treating RN: Renne Crigler Primary Care Forest Redwine: PATIENT, NO Other Clinician: Referring Tameah Mihalko: Referral, Self Treating Emelyn Roen/Extender: Maxwell Caul Weeks in Treatment: 31 Wound Status Wound Number: 3 Primary Etiology: Lymphedema Wound Location: Right Lower Leg - Anterior Wound Status: Open Wounding Event: Gradually Appeared Comorbid History: Lymphedema,  Hypertension Date Acquired: 04/07/2018 Weeks Of Treatment: 1 Clustered Wound: No Photos Photo Uploaded By: Renne Crigler on 04/17/2018 16:20:00 Wound Measurements Length: (cm) 7 Width: (cm) 7.5 Depth: (cm) 0.1 Area: (cm) 41.233 Volume: (cm) 4.123 % Reduction in Area: -8097.4% % Reduction in Volume: -8146% Epithelialization: Large (67-100%) Tunneling: No Undermining: No Wound Description Classification: Partial Thickness Wound Margin: Flat and Intact Exudate Amount: Large Exudate Type: Serous Exudate Color: amber Foul Odor After Cleansing: No Slough/Fibrino No Wound Bed Granulation Amount: Medium (34-66%) Exposed Structure Granulation Quality: Red, Pink Fascia Exposed: No Necrotic Amount: Medium (34-66%) Fat Layer (Subcutaneous Tissue) Exposed: Yes Necrotic Quality: Adherent Slough Tendon Exposed: No Muscle Exposed: No Joint Exposed: No Bone Exposed: No Periwound Skin Texture Rozell, Ambrea R. ([id number]) Texture Color No Abnormalities Noted: No No Abnormalities Noted: No Callus: No Atrophie Blanche: No Crepitus: No Cyanosis: No Excoriation: Yes Ecchymosis: No Induration: No Erythema: No Rash: No  Hemosiderin Staining: No Scarring: No Mottled: No Pallor: No Moisture Rubor: No No Abnormalities Noted: No Dry / Scaly: No Temperature / Pain Maceration: Yes Temperature: No Abnormality Tenderness on Palpation: Yes Wound Preparation Ulcer Cleansing: Rinsed/Irrigated with Saline Topical Anesthetic Applied: None, Other: lidocaine 4%, Electronic Signature(s) Signed: 04/17/2018 4:15:53 PM By: Renne Crigler Entered By: Renne Crigler on 04/17/2018 16:10:96 [name], [name] ([id number]) -------------------------------------------------------------------------------- Wound Assessment Details Patient Name: Jessica Casey R. Date of Service: 04/17/2018 9:00 AM Medical Record Number: [id number] Patient Account Number: [id number] Date of Birth/Sex: 07-19-1981  (37 y.o. F) Treating RN: Renne Crigler Primary Care Sharicka Pogorzelski: PATIENT, NO Other Clinician: Referring Athel Merriweather: Referral, Self Treating Mareena Cavan/Extender: Maxwell Caul Weeks in Treatment: 31 Wound Status Wound Number: 4 Primary Etiology: Lymphedema Wound Location: Right Lower Leg - Lateral, Distal Wound Status: Open Wounding Event: Gradually Appeared Comorbid History: Lymphedema, Hypertension Date Acquired: 04/07/2018 Weeks Of Treatment: 1 Clustered Wound: No Photos Photo Uploaded By: Renne Crigler on 04/17/2018 16:20:43 Wound Measurements Length: (cm) 1.2 Width: (cm) 1 Depth: (cm) 0.1 Area: (cm) 0.942 Volume: (cm) 0.094 % Reduction in Area: 92% % Reduction in Volume: 92% Epithelialization: Large (67-100%) Tunneling: No Undermining: No Wound Description Classification: Partial Thickness Wound Margin: Flat and Intact Exudate Amount: Large Exudate Type: Serous Exudate Color: amber Foul Odor After Cleansing: No Slough/Fibrino Yes Wound Bed Granulation Amount: Medium (34-66%) Exposed Structure Granulation Quality: Red, Pink Fascia Exposed: No Necrotic Amount: Medium (34-66%) Fat Layer (Subcutaneous Tissue) Exposed: Yes Necrotic Quality: Adherent Slough Tendon Exposed: No Muscle Exposed: No Joint Exposed: No Bone Exposed: No Periwound Skin Texture Atayde, Kenadie R. ([id number]) Texture Color No Abnormalities Noted: No No Abnormalities Noted: No Callus: No Atrophie Blanche: No Crepitus: No Cyanosis: No Excoriation: No Ecchymosis: No Induration: No Erythema: No Rash: No Hemosiderin Staining: No Scarring: No Mottled: No Pallor: No Moisture Rubor: No No Abnormalities Noted: No Dry / Scaly: No Temperature / Pain Maceration: No Temperature: No Abnormality Tenderness on Palpation: Yes Wound Preparation Ulcer Cleansing: Rinsed/Irrigated with Saline Topical Anesthetic Applied: None, Other: lidocaine 4%, Electronic Signature(s) Signed:  04/17/2018 4:15:53 PM By: Renne Crigler Entered By: Renne Crigler on 04/17/2018 09:29:17 [name], [name] ([id number]) -------------------------------------------------------------------------------- Wound Assessment Details Patient Name: Jessica Casey R. Date of Service: 04/17/2018 9:00 AM Medical Record Number: [id number] Patient Account Number: [id number] Date of Birth/Sex: Dec 07, 1980 (37 y.o. F) Treating RN: Renne Crigler Primary Care Omere Marti: PATIENT, NO Other Clinician: Referring Mckinna Demars: Referral, Self Treating Tecla Mailloux/Extender: Maxwell Caul Weeks in Treatment: 31 Wound Status Wound Number: 5 Primary Etiology: Lymphedema Wound Location: Left Lower Leg - Anterior Wound Status: Open Wounding Event: Gradually Appeared Comorbid History: Lymphedema, Hypertension Date Acquired: 04/07/2018 Weeks Of Treatment: 1 Clustered Wound: No Photos Photo Uploaded By: Renne Crigler on 04/17/2018 16:20:43 Wound Measurements Length: (cm) 2.5 Width: (cm) 3 Depth: (cm) 0.1 Area: (cm) 5.89 Volume: (cm) 0.589 % Reduction in Area: 23.5% % Reduction in Volume: 84.7% Epithelialization: Large (67-100%) Tunneling: No Undermining: No Wound Description Full Thickness Without Exposed Support Classification: Structures Wound Margin: Flat and Intact Exudate Large Amount: Exudate Type: Serosanguineous Exudate Color: red, brown Foul Odor After Cleansing: No Slough/Fibrino Yes Wound Bed Granulation Amount: Small (1-33%) Exposed Structure Granulation Quality: Red, Hyper-granulation Fascia Exposed: No Necrotic Amount: Large (67-100%) Fat Layer (Subcutaneous Tissue) Exposed: Yes Necrotic Quality: Eschar, Adherent Slough Tendon Exposed: No Muscle Exposed: No Joint Exposed: No Bone Exposed: No [name], Loralee R. ([id number]) Periwound Skin Texture Texture Color No Abnormalities Noted: No No Abnormalities Noted: No Callus: No Atrophie Blanche: No Crepitus:  No Cyanosis:  No Excoriation: Yes Ecchymosis: No Induration: No Erythema: No Rash: No Hemosiderin Staining: No Scarring: No Mottled: No Pallor: No Moisture Rubor: No No Abnormalities Noted: No Dry / Scaly: No Temperature / Pain Maceration: No Temperature: No Abnormality Tenderness on Palpation: Yes Wound Preparation Ulcer Cleansing: Rinsed/Irrigated with Saline Topical Anesthetic Applied: Other: lidocaine 4%, Electronic Signature(s) Signed: 04/17/2018 4:15:53 PM By: Renne Crigler Entered By: Renne Crigler on 04/17/2018 09:30:00 [name] ([id number]) -------------------------------------------------------------------------------- Wound Assessment Details Patient Name: Jessica Casey R. Date of Service: 04/17/2018 9:00 AM Medical Record Number: [id number] Patient Account Number: [id number] Date of Birth/Sex: 09-28-1981 (37 y.o. F) Treating RN: Renne Crigler Primary Care Reeva Davern: PATIENT, NO Other Clinician: Referring Mahogany Torrance: Referral, Self Treating Maleena Eddleman/Extender: Maxwell Caul Weeks in Treatment: 31 Wound Status Wound Number: 6 Primary Etiology: Lymphedema Wound Location: Left Lower Leg Wound Status: Open Wounding Event: Gradually Appeared Comorbid History: Lymphedema, Hypertension Date Acquired: 03/31/2018 Weeks Of Treatment: 1 Clustered Wound: No Photos Photo Uploaded By: Renne Crigler on 04/17/2018 16:21:06 Wound Measurements Length: (cm) 1 Width: (cm) 1 Depth: (cm) 0.1 Area: (cm) 0.785 Volume: (cm) 0.079 % Reduction in Area: 24.3% % Reduction in Volume: 61.8% Epithelialization: Small (1-33%) Tunneling: No Undermining: No Wound Description Full Thickness Without Exposed Support Classification: Structures Wound Margin: Flat and Intact Exudate Medium Amount: Exudate Type: Serosanguineous Exudate Color: red, brown Foul Odor After Cleansing: No Slough/Fibrino Yes Wound Bed Granulation Amount: Medium (34-66%) Exposed  Structure Granulation Quality: Red Fascia Exposed: No Necrotic Amount: Medium (34-66%) Fat Layer (Subcutaneous Tissue) Exposed: Yes Necrotic Quality: Eschar, Adherent Slough Tendon Exposed: No Muscle Exposed: No Joint Exposed: No Bone Exposed: No Rezabek, Shamecka R. ([id number]) Periwound Skin Texture Texture Color No Abnormalities Noted: No No Abnormalities Noted: No Callus: No Atrophie Blanche: No Crepitus: No Cyanosis: No Excoriation: No Ecchymosis: No Induration: No Erythema: No Rash: No Hemosiderin Staining: Yes Scarring: No Mottled: No Pallor: No Moisture Rubor: No No Abnormalities Noted: No Dry / Scaly: No Maceration: No Wound Preparation Ulcer Cleansing: Rinsed/Irrigated with Saline Topical Anesthetic Applied: Other: lidocaine 4%, Electronic Signature(s) Signed: 04/17/2018 4:15:53 PM By: Renne Crigler Entered By: Renne Crigler on 04/17/2018 09:30:45 Lansberry, Faustine R. ([id number]) -------------------------------------------------------------------------------- Vitals Details Patient Name: Jessica Casey R. Date of Service: 04/17/2018 9:00 AM Medical Record Number: [id number] Patient Account Number: [id number] Date of Birth/Sex: Jan 09, 1981 (37 y.o. F) Treating RN: Renne Crigler Primary Care Serene Kopf: PATIENT, NO Other Clinician: Referring Demorio Seeley: Referral, Self Treating Alaycia Eardley/Extender: Altamese [place] in Treatment: 31 Vital Signs Time Taken: 09:05 Temperature (F): 98.6 Height (in): 74 Pulse (bpm): 86 Weight (lbs): 486 Respiratory Rate (breaths/min): 16 Source: Measured Blood Pressure (mmHg): 131/80 Body Mass Index (BMI): 62.4 Reference Range: 80 - 120 mg / dl Electronic Signature(s) Signed: 04/22/2018 10:50:34 AM By: Elliot Gurney, BSN, RN, CWS, Kim RN, BSN Previous Signature: 04/17/2018 4:15:53 PM Version By: Renne Crigler Entered By: Elliot Gurney, BSN, RN, CWS, Kim on 04/22/2018 10:50:34

## 2018-04-19 NOTE — Progress Notes (Signed)
ASIANA, BENNINGER ([id number]) Visit Report for 04/17/2018 HPI Details Patient Name: [name], [name] Date of Service: 04/17/2018 9:00 AM Medical Record Number: [id number] Patient Account Number: [id number] Date of Birth/Sex: [birth year] (37 y.o. F) Treating RN: Huel Coventry Primary Care Provider: PATIENT, NO Other Clinician: Referring Provider: Referral, Self Treating Provider/Extender: Altamese [place] in Treatment: 31 History of Present Illness HPI Description: 37 year old patient well known to our [facility] wound care clinic where she has been seen since 2016 for bilateral lower extremity venous insufficiency disease with lymphedema and multiple ulcerations associated with morbid obesity. she had custom-made compression stockings and lymphedema pumps which were used in the past. most recently she was admitted to the hospital between October 11 and 09/02/2017 with sepsis, lower extremity wounds and lymphedema.she was initially treated in the outpatient with Keflex and Bactrim. she was initially treated in the hospital with vancomycin and Zosyn and changed over to Unasyn until her white count improved and her blood cultures were negative for 3 days. After her inpatient management she was discharged home on Augmentin to end on 09/13/2017 with a 14 day course. she has had outpatient vascular duplex scans completed in November 2017 and her right ABI was 1.1 and the left ABI is 1.3. she had normal toe brachial indices bilaterally.she had three-vessel runoff in the right lower extremity and two-vessel runoff in the left lower extremity. On questioning the patient she does have custom made compression stockings and also has a lymphedema pump but has not been using it appropriately and has not been taking good care of herself. 09/17/2017 -- she returns today with compression stockings on the left side and the right side has had significant amount of drainage and has a very strong odor 09/24/2017  -- the drainage is increased significantly and she has more lymphedema and a very strong odor to her wound. Though she does not have systemic symptoms, or overt infection I believe she will benefit from some doxycycline given empirically. 10/01/2017 -- after starting the doxycycline and changing the dressing twice a week her symptoms and signs have definitely improved overall. 10/08/2017 -- she has completed her course of doxycycline but continues to have a lot of drainage and needs twice a week dressing changes. 11/08/17-she is here in follow-up evaluation for right lower extremity ulcers. She admits to using her lymphedema pumps twice daily, one hour per session. she is voicing no complaints or concerns, no signs of infection will change to [facility] 12/14/17 on evaluation today patient appears to be doing very well in regard to her wounds. She has been tolerating the dressing changes she continues to develop some portly the adherent granular tissue on the surface of the wound with some Slough. Obviously we are trying to get too much better wound bed. With that being said the hyper granulation the Hydrofera Blue Dressing to have helped with which is excellent news. However I think it may be time to try something a little bit different at this point. 01/11/18 on evaluation today patient appears to be doing fairly well in regard to her right lateral lower extremity ulcers. This shows excellent signs of filling in which is great news. There does not appear to be any evidence of infection which is also good news. She does continue to work as well is good school. She is having no pain. 01/22/18 on evaluation today patient appears to be doing a little bit worse in my opinion in regard to the overall quality of that granulation on  her right lower extremity. She was not here last week due to being sick with a stomach virus this may have something to do with the fact that her wound appears to be a little bit  worse. With that being said I'm also thinking that after switching from the [facility] Dressing to the silver collagen would really has not looked that's good in my opinion. We Kendrick, Finola R. ([id number]) may want to swit 02/11/18 on evaluation today patient's right lower/lateral lower extremity ulcers appear to be doing very well at this point. Especially the more proximal ulcer has filled in much closer to surface which is good news. Nonetheless both show signs of improvement which is great news. There does not appear to be any evidence of infection which is also good news. In general patient has been doing well tolerating the wraps as well as the [employer]. 02/18/18 on evaluation today patient appears to be doing a little bit worse in regard to the periwound region the wounds themselves do not look much deteriorated to me. With that being said she has several small blisters/pustules noted in the periwound and there was a significant amount of drainage and maceration compared to previous. There has been a time that we had to bring her back for twice a week dressing changes as far as her wrap was concerned it has been a while since we've done that however. With that being said the patient has been having some burning and in general I'm concerned about the possibility of infection. She has previously taken doxycycline with good result. Fortunately there does not appear to be any evidence of overall worsening in regard to the size of the wound and in fact the upper wound actually appears to be showing signs of good epithelialization. 03/18/18 on evaluation today patient appears to be doing excellent in regard to her right lateral lower extremity ulcer. She has been tolerating the dressing changes without complication. Fortunately this seems to be making great progress. Overall I see no signs of infection and there is dramatic improvement overall even compared to last week. 03/28/18 on  evaluation today patient appears to be doing very well in regard to her right lateral lower surety ulcer. She has been tolerating the dressing changes without complication at this point. She states currently that she's having no significant discomfort which is excellent as well. Overall I'm pleased with how things seem to be progressing. 04/01/18 on evaluation today patient appears to be doing excellent in regard to her right lateral lower extremity ulcers. She has been tolerating the dressing changes without complication which is good news. With that being said the wraps still continues to show signs of helping with her fluid she does have Juxta-Lite compression stockings for when we are done with the current treatment regimen once everything heals. Mainly she just has the one area still remaining the smaller of the two wings is pretty much closed at this point there's just a very slight opening noted. 04/08/18 on evaluation today patient appears to be doing better in regard to the original wound on the right lateral lower extremity that we have been managing. Unfortunately she has a new area of weeping more anterior on the right lateral lower extremity and on the left lower extremity she has two new ulcers there appears to be some cellulitis noted at this time. I am concerned about the fact that this may in fact be an infection that has caused the worsening and swelling in  the past this has been the case when we previously attempted to determine what was going on when she had down slides like this. With that being said the patient is seeming to tolerate the wraps fairly well for the most part. 04/17/18; since last time the patient was seen in this clinic she was hospitalized from 04/11/18 through 04/13/18; she presented with bilateral lower extremity pain worse on the right and a fever of up to 104. Noteworthy that when she was in the clinic last week she had new wounds on the left leg culture grew MRSA  and she was prescribed Bactrim. She had 2 days of IV Vanco and Zosyn in the hospital. Her blood cultures were negative. She was discharged on Bactrim to cover the original MRSA on the right leg and Keflex to cover the possibility of strep. The hospitalist had a conversation with infectious disease. The patient arrives in clinic today for nurse check however given the recent hospitalization I was asked to look at her. The patient states she feels a lot better. No fever or chills. Still some pain in the right calf but a lot better. She arrived in the hospital with a white count of 15.6, the next day was 10.8. Comprehensive metabolic panel was normal. She is still taking Keflex and Bactrim It would appear that she had a surgical IandD at the bedside of the right calf felt to have a underlying abscess. According to our intake nurse the wound has expanded quite bit on the right lateral calf. She has no open area on the left anterior and left posterior calf as described last time Electronic Signature(s) Signed: 04/18/2018 8:24:56 AM By: Baltazar Najjarobson, Sakeena Teall MD Entered By: Baltazar Najjarobson, Jaqua Ching on 04/17/2018 09:53:49 [name], [name] DeerERICA R. ([id number]) -------------------------------------------------------------------------------- Physical Exam Details Patient Name: Jessica CaseyLEATH, [name] Date of Service: 04/17/2018 9:00 AM Medical Record Number: [id number] Patient Account Number: [id number] Date of Birth/Sex: 1981-02-09 (37 y.o. F) Treating RN: Huel CoventryWoody, Kim Primary Care Provider: PATIENT, NO Other Clinician: Referring Provider: Referral, Self Treating Provider/Extender: Altamese CarolinaOBSON, Hamsini Verrilli G [place] in Treatment: 31 Constitutional Sitting or standing Blood Pressure is within target range for patient.. Pulse regular and within target range for patient.Marland Kitchen. Respirations regular, non-labored and within target range.. Temperature is normal and within the target range for the patient.Marland Kitchen. appears in no distress.morbid obesity.appears  systemically well. Eyes Conjunctivae clear. No discharge. Respiratory Respiratory effort is easy and symmetric bilaterally. Rate is normal at rest and on room air.. Bilateral breath sounds are clear and equal in all lobes with no wheezes, rales or rhonchi.. Cardiovascular Pedal pulses palpable and strong bilaterally.. Edema present in both extremities.much worse on the right however there is no palpable tenderness no warmth. Gastrointestinal (GI) obese nontender no masses. Lymphatic none palpable in the right popliteal or inguinal area. Integumentary (Hair, Skin) lymphedema but no systemic skin issue.Marland Kitchen. Psychiatric No evidence of depression, anxiety, or agitation. Calm, cooperative, and communicative. Appropriate interactions and affect.. Notes wound exam #1 on the right lateral calf she has a small open area she had previously however a large area of tissue breakdown above it with a bit of a necrotic surface of this. There is a small half inch surgical incision just superior to this. Still a mild amount of purulence coming out of this. But no tenderness #2 on the left calf she has the open area posteriorly and anteriorly roughly the same as last week. Electronic Signature(s) Signed: 04/18/2018 8:24:56 AM By: Baltazar Najjarobson, Jessica Cortese MD Entered By: Baltazar Najjarobson, Kayde Atkerson on 04/17/2018 09:56:27  [name], [name] ([id number]) -------------------------------------------------------------------------------- Physician Orders Details Patient Name: SHONIA, SKILLING R. Date of Service: 04/17/2018 9:00 AM Medical Record Number: [id number] Patient Account Number: [id number] Date of Birth/Sex: 06-29-1981 (38 y.o. F) Treating RN: Renne Crigler Primary Care Provider: PATIENT, NO Other Clinician: Referring Provider: Referral, Self Treating Provider/Extender: Altamese [place] in Treatment: 31 Verbal / Phone Orders: No Diagnosis Coding Wound Cleansing Wound #2 Right,Distal,Lateral Lower Leg o Clean wound  with Normal Saline. Wound #3 Right,Anterior Lower Leg o Clean wound with Normal Saline. Wound #4 Right,Distal,Lateral Lower Leg o Clean wound with Normal Saline. Wound #5 Left,Anterior Lower Leg o Clean wound with Normal Saline. Wound #6 Left Lower Leg o Clean wound with Normal Saline. Anesthetic (add to Medication List) Wound #2 Right,Distal,Lateral Lower Leg o Topical Lidocaine 4% cream applied to wound bed prior to debridement (In Clinic Only). Wound #3 Right,Anterior Lower Leg o Topical Lidocaine 4% cream applied to wound bed prior to debridement (In Clinic Only). Wound #4 Right,Distal,Lateral Lower Leg o Topical Lidocaine 4% cream applied to wound bed prior to debridement (In Clinic Only). Wound #5 Left,Anterior Lower Leg o Topical Lidocaine 4% cream applied to wound bed prior to debridement (In Clinic Only). Wound #6 Left Lower Leg o Topical Lidocaine 4% cream applied to wound bed prior to debridement (In Clinic Only). Skin Barriers/Peri-Wound Care o Moisturizing lotion Primary Wound Dressing Wound #2 Right,Distal,Lateral Lower Leg o Other: - silvercell Wound #3 Right,Anterior Lower Leg o Other: - silvercell Wound #4 Right,Distal,Lateral Lower Leg o Other: - silvercell Mogensen, [name] ([id number]) Wound #5 Left,Anterior Lower Leg o Other: - silvercell Wound #6 Left Lower Leg o Other: - silvercell Secondary Dressing Wound #2 Right,Distal,Lateral Lower Leg o ABD pad o XtraSorb Wound #3 Right,Anterior Lower Leg o ABD pad o XtraSorb Wound #4 Right,Distal,Lateral Lower Leg o ABD pad o XtraSorb Wound #5 Left,Anterior Lower Leg o ABD pad o XtraSorb Wound #6 Left Lower Leg o ABD pad o XtraSorb Dressing Change Frequency Wound #2 Right,Distal,Lateral Lower Leg o Other: - twice weekly, return for nurse visit friday Wound #3 Right,Anterior Lower Leg o Other: - twice weekly, return for nurse visit friday Wound #4  Right,Distal,Lateral Lower Leg o Other: - twice weekly, return for nurse visit friday Wound #5 Left,Anterior Lower Leg o Other: - twice weekly, return for nurse visit friday Wound #6 Left Lower Leg o Other: - twice weekly, return for nurse visit friday Follow-up Appointments Wound #2 Right,Distal,Lateral Lower Leg o Nurse Visit as needed o Other: - twice weekly clinic visit, return on friday for nurse visit Wound #3 Right,Anterior Lower Leg o Nurse Visit as needed o Other: - twice weekly clinic visit, return on friday for nurse visit Wound #4 Right,Distal,Lateral Lower Leg o Nurse Visit as needed o Other: - twice weekly clinic visit, return on friday for nurse visit [name], [name] ([id number]) Wound #5 Left,Anterior Lower Leg o Nurse Visit as needed o Other: - twice weekly clinic visit, return on friday for nurse visit Wound #6 Left Lower Leg o Nurse Visit as needed o Other: - twice weekly clinic visit, return on friday for nurse visit Edema Control Wound #2 Right,Distal,Lateral Lower Leg o 4 Layer Compression System - Bilateral o Compression Pump: Use compression pump on left lower extremity for 30 minutes, twice daily. - one hour use o Compression Pump: Use compression pump on right lower extremity for 30 minutes, twice daily. - one hour use Wound #3 Right,Anterior Lower Leg o 4 Layer Compression System -  Bilateral o Compression Pump: Use compression pump on left lower extremity for 30 minutes, twice daily. - one hour use o Compression Pump: Use compression pump on right lower extremity for 30 minutes, twice daily. - one hour use Wound #4 Right,Distal,Lateral Lower Leg o 4 Layer Compression System - Bilateral o Compression Pump: Use compression pump on left lower extremity for 30 minutes, twice daily. - one hour use o Compression Pump: Use compression pump on right lower extremity for 30 minutes, twice daily. - one hour use Wound #5  Left,Anterior Lower Leg o 4 Layer Compression System - Bilateral o Compression Pump: Use compression pump on left lower extremity for 30 minutes, twice daily. - one hour use o Compression Pump: Use compression pump on right lower extremity for 30 minutes, twice daily. - one hour use Wound #6 Left Lower Leg o 4 Layer Compression System - Bilateral o Compression Pump: Use compression pump on left lower extremity for 30 minutes, twice daily. - one hour use o Compression Pump: Use compression pump on right lower extremity for 30 minutes, twice daily. - one hour use Additional Orders / Instructions o Vitamin A; Vitamin C, Zinc o Increase protein intake. Electronic Signature(s) Signed: 04/17/2018 4:15:53 PM By: Renne Crigler Signed: 04/18/2018 8:24:56 AM By: Baltazar Najjar MD Entered By: Renne Crigler on 04/17/2018 09:50:11 [name] ([id number]) -------------------------------------------------------------------------------- Problem List Details Patient Name: [name] R. Date of Service: 04/17/2018 9:00 AM Medical Record Number: [id number] Patient Account Number: [id number] Date of Birth/Sex: 1981-09-19 (37 y.o. F) Treating RN: Huel Coventry Primary Care Provider: PATIENT, NO Other Clinician: Referring Provider: Referral, Self Treating Provider/Extender: Altamese [place] in Treatment: 31 Active Problems ICD-10 Impacting Encounter Code Description Active Date Wound Healing Diagnosis L97.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d 09/10/2017 Yes L97.221 Non-pressure chronic ulcer of left calf limited to breakdown of 04/17/2018 Yes skin I89.0 Lymphedema, not elsewhere classified 09/10/2017 Yes I87.311 Chronic venous hypertension (idiopathic) with ulcer of right 09/10/2017 Yes lower extremity E66.01 Morbid (severe) obesity due to excess calories 09/10/2017 Yes Inactive Problems Resolved Problems Electronic Signature(s) Signed:  04/18/2018 8:24:56 AM By: Baltazar Najjar MD Entered By: Baltazar Najjar on 04/17/2018 09:48:06 Dragon, Daesia R. ([id number]) -------------------------------------------------------------------------------- Progress Note Details Patient Name: [name] R. Date of Service: 04/17/2018 9:00 AM Medical Record Number: [id number] Patient Account Number: [id number] Date of Birth/Sex: 1981-04-26 (37 y.o. F) Treating RN: Huel Coventry Primary Care Provider: PATIENT, NO Other Clinician: Referring Provider: Referral, Self Treating Provider/Extender: Altamese  in Treatment: 31 Subjective History of Present Illness (HPI) 37 year old patient well known to our [facility] wound care clinic where she has been seen since 2016 for bilateral lower extremity venous insufficiency disease with lymphedema and multiple ulcerations associated with morbid obesity. she had custom-made compression stockings and lymphedema pumps which were used in the past. most recently she was admitted to the hospital between October 11 and 09/02/2017 with sepsis, lower extremity wounds and lymphedema.she was initially treated in the outpatient with Keflex and Bactrim. she was initially treated in the hospital with vancomycin and Zosyn and changed over to Unasyn until her white count improved and her blood cultures were negative for 3 days. After her inpatient management she was discharged home on Augmentin to end on 09/13/2017 with a 14 day course. she has had outpatient vascular duplex scans completed in November 2017 and her right ABI was 1.1 and the left ABI is 1.3. she had normal toe brachial indices bilaterally.she had three-vessel runoff  in the right lower extremity and two-vessel runoff in the left lower extremity. On questioning the patient she does have custom made compression stockings and also has a lymphedema pump but has not been using it appropriately and has not been taking good care of herself. 09/17/2017  -- she returns today with compression stockings on the left side and the right side has had significant amount of drainage and has a very strong odor 09/24/2017 -- the drainage is increased significantly and she has more lymphedema and a very strong odor to her wound. Though she does not have systemic symptoms, or overt infection I believe she will benefit from some doxycycline given empirically. 10/01/2017 -- after starting the doxycycline and changing the dressing twice a week her symptoms and signs have definitely improved overall. 10/08/2017 -- she has completed her course of doxycycline but continues to have a lot of drainage and needs twice a week dressing changes. 11/08/17-she is here in follow-up evaluation for right lower extremity ulcers. She admits to using her lymphedema pumps twice daily, one hour per session. she is voicing no complaints or concerns, no signs of infection will change to [facility] 12/14/17 on evaluation today patient appears to be doing very well in regard to her wounds. She has been tolerating the dressing changes she continues to develop some portly the adherent granular tissue on the surface of the wound with some Slough. Obviously we are trying to get too much better wound bed. With that being said the hyper granulation the Hydrofera Blue Dressing to have helped with which is excellent news. However I think it may be time to try something a little bit different at this point. 01/11/18 on evaluation today patient appears to be doing fairly well in regard to her right lateral lower extremity ulcers. This shows excellent signs of filling in which is great news. There does not appear to be any evidence of infection which is also good news. She does continue to work as well is good school. She is having no pain. 01/22/18 on evaluation today patient appears to be doing a little bit worse in my opinion in regard to the overall quality of that granulation on her right lower  extremity. She was not here last week due to being sick with a stomach virus this may have something to do with the fact that her wound appears to be a little bit worse. With that being said I'm also thinking that after switching from the [facility] Dressing to the silver collagen would really has not looked that's good in my opinion. We may want to swit [name], [name] ([id number]) 02/11/18 on evaluation today patient's right lower/lateral lower extremity ulcers appear to be doing very well at this point. Especially the more proximal ulcer has filled in much closer to surface which is good news. Nonetheless both show signs of improvement which is great news. There does not appear to be any evidence of infection which is also good news. In general patient has been doing well tolerating the wraps as well as the [employer]. 02/18/18 on evaluation today patient appears to be doing a little bit worse in regard to the periwound region the wounds themselves do not look much deteriorated to me. With that being said she has several small blisters/pustules noted in the periwound and there was a significant amount of drainage and maceration compared to previous. There has been a time that we had to bring her back for twice a week dressing  changes as far as her wrap was concerned it has been a while since we've done that however. With that being said the patient has been having some burning and in general I'm concerned about the possibility of infection. She has previously taken doxycycline with good result. Fortunately there does not appear to be any evidence of overall worsening in regard to the size of the wound and in fact the upper wound actually appears to be showing signs of good epithelialization. 03/18/18 on evaluation today patient appears to be doing excellent in regard to her right lateral lower extremity ulcer. She has been tolerating the dressing changes without complication.  Fortunately this seems to be making great progress. Overall I see no signs of infection and there is dramatic improvement overall even compared to last week. 03/28/18 on evaluation today patient appears to be doing very well in regard to her right lateral lower surety ulcer. She has been tolerating the dressing changes without complication at this point. She states currently that she's having no significant discomfort which is excellent as well. Overall I'm pleased with how things seem to be progressing. 04/01/18 on evaluation today patient appears to be doing excellent in regard to her right lateral lower extremity ulcers. She has been tolerating the dressing changes without complication which is good news. With that being said the wraps still continues to show signs of helping with her fluid she does have Juxta-Lite compression stockings for when we are done with the current treatment regimen once everything heals. Mainly she just has the one area still remaining the smaller of the two wings is pretty much closed at this point there's just a very slight opening noted. 04/08/18 on evaluation today patient appears to be doing better in regard to the original wound on the right lateral lower extremity that we have been managing. Unfortunately she has a new area of weeping more anterior on the right lateral lower extremity and on the left lower extremity she has two new ulcers there appears to be some cellulitis noted at this time. I am concerned about the fact that this may in fact be an infection that has caused the worsening and swelling in the past this has been the case when we previously attempted to determine what was going on when she had down slides like this. With that being said the patient is seeming to tolerate the wraps fairly well for the most part. 04/17/18; since last time the patient was seen in this clinic she was hospitalized from 04/11/18 through 04/13/18; she presented with bilateral  lower extremity pain worse on the right and a fever of up to 104. Noteworthy that when she was in the clinic last week she had new wounds on the left leg culture grew MRSA and she was prescribed Bactrim. She had 2 days of IV Vanco and Zosyn in the hospital. Her blood cultures were negative. She was discharged on Bactrim to cover the original MRSA on the right leg and Keflex to cover the possibility of strep. The hospitalist had a conversation with infectious disease. The patient arrives in clinic today for nurse check however given the recent hospitalization I was asked to look at her. The patient states she feels a lot better. No fever or chills. Still some pain in the right calf but a lot better. She arrived in the hospital with a white count of 15.6, the next day was 10.8. Comprehensive metabolic panel was normal. She is still taking Keflex and Bactrim It  would appear that she had a surgical IandD at the bedside of the right calf felt to have a underlying abscess. According to our intake nurse the wound has expanded quite bit on the right lateral calf. She has no open area on the left anterior and left posterior calf as described last time Objective [name], [name] ([id number]) Constitutional Sitting or standing Blood Pressure is within target range for patient.. Pulse regular and within target range for patient.Marland Kitchen Respirations regular, non-labored and within target range.. Temperature is normal and within the target range for the patient.Marland Kitchen appears in no distress.morbid obesity.appears systemically well. Vitals Time Taken: 9:05 AM, Height: 74 in, Weight: 505 lbs, BMI: 64.8, Temperature: 98.6 F, Pulse: 86 bpm, Respiratory Rate: 16 breaths/min, Blood Pressure: 131/80 mmHg. Eyes Conjunctivae clear. No discharge. Respiratory Respiratory effort is easy and symmetric bilaterally. Rate is normal at rest and on room air.. Bilateral breath sounds are clear and equal in all lobes with no wheezes,  rales or rhonchi.. Cardiovascular Pedal pulses palpable and strong bilaterally.. Edema present in both extremities.much worse on the right however there is no palpable tenderness no warmth. Gastrointestinal (GI) obese nontender no masses. Lymphatic none palpable in the right popliteal or inguinal area. Psychiatric No evidence of depression, anxiety, or agitation. Calm, cooperative, and communicative. Appropriate interactions and affect.. General Notes: wound exam #1 on the right lateral calf she has a small open area she had previously however a large area of tissue breakdown above it with a bit of a necrotic surface of this. There is a small half inch surgical incision just superior to this. Still a mild amount of purulence coming out of this. But no tenderness #2 on the left calf she has the open area posteriorly and anteriorly roughly the same as last week. Integumentary (Hair, Skin) lymphedema but no systemic skin issue.. Wound #2 status is Open. Original cause of wound was Gradually Appeared. The wound is located on the Right,Distal,Lateral Lower Leg. The wound measures 1.2cm length x 1.5cm width x 0.1cm depth; 1.414cm^2 area and 0.141cm^3 volume. There is no tunneling or undermining noted. There is a large amount of serous drainage noted. The wound margin is indistinct and nonvisible. There is medium (34-66%) red, pink granulation within the wound bed. There is a medium (34-66%) amount of necrotic tissue within the wound bed including Eschar and Adherent Slough. The periwound skin appearance exhibited: Excoriation, Maceration. The periwound skin appearance did not exhibit: Callus, Crepitus, Induration, Rash, Scarring, Dry/Scaly, Atrophie Blanche, Cyanosis, Ecchymosis, Hemosiderin Staining, Mottled, Pallor, Rubor, Erythema. Periwound temperature was noted as No Abnormality. The periwound has tenderness on palpation. Wound #3 status is Open. Original cause of wound was Gradually Appeared.  The wound is located on the Right,Anterior Lower Leg. The wound measures 7cm length x 7.5cm width x 0.1cm depth; 41.233cm^2 area and 4.123cm^3 volume. There is Fat Layer (Subcutaneous Tissue) Exposed exposed. There is no tunneling or undermining noted. There is a large amount of serous drainage noted. The wound margin is flat and intact. There is medium (34-66%) red, pink granulation within the wound bed. There is a medium (34-66%) amount of necrotic tissue within the wound bed including Adherent Slough. The periwound skin appearance exhibited: Excoriation, Maceration. The periwound skin appearance did not exhibit: Callus, Crepitus, Induration, Rash, Scarring, Dry/Scaly, Atrophie Blanche, Cyanosis, Ecchymosis, Hemosiderin Staining, Mottled, Pallor, Rubor, Erythema. Periwound temperature was noted as No Abnormality. The periwound has tenderness on palpation. Wound #4 status is Open. Original cause of wound was Gradually Appeared. The  wound is located on the Right,Distal,Lateral Lower Leg. The wound measures 1.2cm length x 1cm width x 0.1cm depth; 0.942cm^2 area and 0.094cm^3 volume. There is Fat Layer (Subcutaneous Tissue) Exposed exposed. There is no tunneling or undermining noted. There is a large amount of Carras, Mitzi R. ([id number]) serous drainage noted. The wound margin is flat and intact. There is medium (34-66%) red, pink granulation within the wound bed. There is a medium (34-66%) amount of necrotic tissue within the wound bed including Adherent Slough. The periwound skin appearance did not exhibit: Callus, Crepitus, Excoriation, Induration, Rash, Scarring, Dry/Scaly, Maceration, Atrophie Blanche, Cyanosis, Ecchymosis, Hemosiderin Staining, Mottled, Pallor, Rubor, Erythema. Periwound temperature was noted as No Abnormality. The periwound has tenderness on palpation. Wound #5 status is Open. Original cause of wound was Gradually Appeared. The wound is located on the Left,Anterior Lower Leg.  The wound measures 2.5cm length x 3cm width x 0.1cm depth; 5.89cm^2 area and 0.589cm^3 volume. There is Fat Layer (Subcutaneous Tissue) Exposed exposed. There is no tunneling or undermining noted. There is a large amount of serosanguineous drainage noted. The wound margin is flat and intact. There is small (1-33%) red, hyper - granulation within the wound bed. There is a large (67-100%) amount of necrotic tissue within the wound bed including Eschar and Adherent Slough. The periwound skin appearance exhibited: Excoriation. The periwound skin appearance did not exhibit: Callus, Crepitus, Induration, Rash, Scarring, Dry/Scaly, Maceration, Atrophie Blanche, Cyanosis, Ecchymosis, Hemosiderin Staining, Mottled, Pallor, Rubor, Erythema. Periwound temperature was noted as No Abnormality. The periwound has tenderness on palpation. Wound #6 status is Open. Original cause of wound was Gradually Appeared. The wound is located on the Left Lower Leg. The wound measures 1cm length x 1cm width x 0.1cm depth; 0.785cm^2 area and 0.079cm^3 volume. There is Fat Layer (Subcutaneous Tissue) Exposed exposed. There is no tunneling or undermining noted. There is a medium amount of serosanguineous drainage noted. The wound margin is flat and intact. There is medium (34-66%) red granulation within the wound bed. There is a medium (34-66%) amount of necrotic tissue within the wound bed including Eschar and Adherent Slough. The periwound skin appearance exhibited: Hemosiderin Staining. The periwound skin appearance did not exhibit: Callus, Crepitus, Excoriation, Induration, Rash, Scarring, Dry/Scaly, Maceration, Atrophie Blanche, Cyanosis, Ecchymosis, Mottled, Pallor, Rubor, Erythema. 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 Wound #3 Right,Anterior Lower Leg: Clean wound with Normal Saline. Wound #4 Right,Distal,Lateral Lower Leg: Clean wound with Normal Saline. Wound #5 Left,Anterior Lower Leg: Clean wound with Normal Saline. Wound #6 Left Lower Leg: Clean wound with Normal Saline. Anesthetic (add to Medication List): [name], [name] ([id number]) Wound #2 Right,Distal,Lateral Lower Leg: Topical Lidocaine 4% cream applied to wound bed prior to debridement (In Clinic Only). Wound #3 Right,Anterior Lower Leg: Topical Lidocaine 4% cream applied to wound bed prior to debridement (In Clinic Only). Wound #4 Right,Distal,Lateral Lower Leg: Topical Lidocaine 4% cream applied to wound bed prior to debridement (In Clinic Only). Wound #5 Left,Anterior Lower Leg: Topical Lidocaine 4% cream applied to wound bed prior to debridement (In Clinic Only). Wound #6 Left Lower Leg: Topical Lidocaine 4% cream applied to wound bed  prior to debridement (In Clinic Only). Skin Barriers/Peri-Wound Care: Moisturizing lotion Primary Wound Dressing: Wound #2 Right,Distal,Lateral Lower Leg: Other: - silvercell Wound #3 Right,Anterior Lower Leg: Other: - silvercell Wound #4 Right,Distal,Lateral Lower Leg: Other: - silvercell Wound #5 Left,Anterior Lower Leg: Other: - silvercell Wound #6 Left Lower Leg: Other: - silvercell Secondary Dressing: Wound #2 Right,Distal,Lateral Lower Leg: ABD pad XtraSorb Wound #3 Right,Anterior Lower Leg: ABD pad XtraSorb Wound #4 Right,Distal,Lateral Lower Leg: ABD pad XtraSorb Wound #5 Left,Anterior Lower Leg: ABD pad XtraSorb Wound #6 Left Lower Leg: ABD pad XtraSorb Dressing Change Frequency: Wound #2 Right,Distal,Lateral Lower Leg: Other: - twice weekly,  return for nurse visit friday Wound #3 Right,Anterior Lower Leg: Other: - twice weekly, return for nurse visit friday Wound #4 Right,Distal,Lateral Lower Leg: Other: - twice weekly, return for nurse visit friday Wound #5 Left,Anterior Lower Leg: Other: - twice weekly, return for nurse visit friday Wound #6 Left Lower Leg: Other: - twice weekly, return for nurse visit friday Follow-up Appointments: Wound #2 Right,Distal,Lateral Lower Leg: Nurse Visit as needed Other: - twice weekly clinic visit, return on friday for nurse visit Wound #3 Right,Anterior Lower Leg: Nurse Visit as needed Other: - twice weekly clinic visit, return on friday for nurse visit Wound #4 Right,Distal,Lateral Lower Leg: Nurse Visit as needed Other: - twice weekly clinic visit, return on friday for nurse visit [name], [name] ([id number]) Wound #5 Left,Anterior Lower Leg: Nurse Visit as needed Other: - twice weekly clinic visit, return on friday for nurse visit Wound #6 Left Lower Leg: Nurse Visit as needed Other: - twice weekly clinic visit, return on friday for nurse visit Edema Control: Wound #2 Right,Distal,Lateral Lower Leg: 4 Layer Compression System - Bilateral Compression Pump: Use compression pump on left lower extremity for 30 minutes, twice daily. - one hour use Compression Pump: Use compression pump on right lower extremity for 30 minutes, twice daily. - one hour use Wound #3 Right,Anterior Lower Leg: 4 Layer Compression System - Bilateral Compression Pump: Use compression pump on left lower extremity for 30 minutes, twice daily. - one hour use Compression Pump: Use compression pump on right lower extremity for 30 minutes, twice daily. - one hour use Wound #4 Right,Distal,Lateral Lower Leg: 4 Layer Compression System - Bilateral Compression Pump: Use compression pump on left lower extremity for 30 minutes, twice daily. - one hour use Compression Pump: Use compression pump on right lower extremity  for 30 minutes, twice daily. - one hour use Wound #5 Left,Anterior Lower Leg: 4 Layer Compression System - Bilateral Compression Pump: Use compression pump on left lower extremity for 30 minutes, twice daily. - one hour use Compression Pump: Use compression pump on right lower extremity for 30 minutes, twice daily. - one hour use Wound #6 Left Lower Leg: 4 Layer Compression System - Bilateral Compression Pump: Use compression pump on left lower extremity for 30 minutes, twice daily. - one hour use Compression Pump: Use compression pump on right lower extremity for 30 minutes, twice daily. - one hour use Additional Orders / Instructions: Vitamin A; Vitamin C, Zinc Increase protein intake. #1 silver alginate and all secondary absorptive secondary dressings under for lower compression. #2 we are going to have to bring her back for a nurse check on Friday, she has a lot of drainage especially on the right lateral calf which I think paradoxically was worst area in terms of her infection even though the new areas on the left where the primary concern last week. #3 she  is completing Keflex and Bactrim and I am okay with her stopping when these are complete culture done of the IandD in the hospital was negative. Keflex and Bactrim should've covered strep and MRSA respectively. We'll need to keep a close eye however on the right calf with regards to additional antibiotics #4 she is going to use her compression pumps twice a day. I told her I was concerned about the amount of edema in the right leg. This is nonpitting and looks mostly like lymphedema Electronic Signature(s) Signed: 04/18/2018 8:24:56 AM By: Baltazar Najjar MD Entered By: Baltazar Najjar on 04/17/2018 10:00:41 [name] ([id number]) -------------------------------------------------------------------------------- SuperBill Details Patient Name: [name] R. Date of Service: 04/17/2018 Medical Record Number: [id number] Patient  Account Number: [id number] Date of Birth/Sex: [birth year] (37 y.o. F) Treating RN: Renne Crigler Primary Care Provider: PATIENT, NO Other Clinician: Referring Provider: Referral, Self Treating Provider/Extender: Altamese Clarinda in Treatment: 31 Diagnosis Coding ICD-10 Codes Code Description [phone] 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d L97.221 Non-pressure chronic ulcer of left calf limited to breakdown of skin I89.0 Lymphedema, not elsewhere classified I87.311 Chronic venous hypertension (idiopathic) with ulcer of right lower extremity E66.01 Morbid (severe) obesity due to excess calories L03.115 Cellulitis of right lower limb Facility Procedures CPT4 Code: [id number] Description: 84696 - WOUND CARE VISIT-LEV 5 EST PT Modifier: Quantity: 1 Physician Procedures CPT4 Code: [id number] Description: 99214 - WC PHYS LEVEL 4 - EST PT ICD-10 Diagnosis Description L97.212 Non-pressure chronic ulcer of right calf with fat layer expo L97.221 Non-pressure chronic ulcer of left calf limited to breakdown L03.115 Cellulitis of right lower limb Modifier: sed of skin Quantity: 1 Electronic Signature(s) Signed: 04/18/2018 8:24:56 AM By: Baltazar Najjar MD Entered By: Baltazar Najjar on 04/17/2018 10:01:42

## 2018-04-21 NOTE — Progress Notes (Signed)
[name], LONGIE ([id number]) Visit Report for 04/19/2018 Arrival Information Details Patient Name: [name], [name] Date of Service: 04/19/2018 8:00 AM Medical Record Number: [id number] Patient Account Number: [id number] Date of Birth/Sex: 08/05/1981 (37 y.o. F) Treating RN: Phillis Haggis Primary Care Devante Capano: PATIENT, NO Other Clinician: Referring Ivianna Notch: Referral, Self Treating Kitai Purdom/Extender: STONE III, HOYT Weeks in Treatment: 31 Visit Information History Since Last Visit All ordered tests and consults were completed: No Patient Arrived: Ambulatory Added or deleted any medications: No Arrival Time: 08:26 Any new allergies or adverse reactions: No Accompanied By: self Had a fall or experienced change in No Transfer Assistance: None activities of daily living that may affect Patient Identification Verified: Yes risk of falls: Secondary Verification Process Completed: Yes Signs or symptoms of abuse/neglect since last visito No Patient Requires Transmission-Based No Hospitalized since last visit: No Precautions: Implantable device outside of the clinic excluding No Patient Has Alerts: No cellular tissue based products placed in the center since last visit: Has Dressing in Place as Prescribed: Yes Has Compression in Place as Prescribed: Yes Pain Present Now: No Electronic Signature(s) Signed: 04/19/2018 5:38:20 PM By: [name] Entered By: [name] on 04/19/2018 08:30:41 Lipuma, Jamiaya R. ([id number]) -------------------------------------------------------------------------------- Compression Therapy Details Patient Name: Jessica Casey R. Date of Service: 04/19/2018 8:00 AM Medical Record Number: [id number] Patient Account Number: [id number] Date of Birth/Sex: 02/19/1981 (37 y.o. F) Treating RN: Curtis Sites Primary Care Katelan Hirt: PATIENT, NO Other Clinician: Referring Niklas Chretien: Referral, Self Treating Mylynn Dinh/Extender: STONE III, HOYT Weeks in Treatment:  31 Compression Therapy Performed for Wound Assessment: Wound #2 Right,Distal,Lateral Lower Leg Performed By: Clinician Curtis Sites, RN Compression Type: Four Layer Pre Treatment ABI: 1.6 Notes patient has been tolerating compression without complications and arterial studies have been ordered Electronic Signature(s) Signed: 04/19/2018 5:45:37 PM By: Curtis Sites Entered By: Curtis Sites on 04/19/2018 09:56:41 Crochet, Glennice R. ([id number]) -------------------------------------------------------------------------------- Compression Therapy Details Patient Name: Jessica Casey R. Date of Service: 04/19/2018 8:00 AM Medical Record Number: [id number] Patient Account Number: [id number] Date of Birth/Sex: 28-Aug-1981 (37 y.o. F) Treating RN: Curtis Sites Primary Care Evalynne Locurto: PATIENT, NO Other Clinician: Referring Derel Mcglasson: Referral, Self Treating Marquasia Schmieder/Extender: STONE III, HOYT Weeks in Treatment: 31 Compression Therapy Performed for Wound Assessment: Wound #4 Right,Distal,Lateral Lower Leg Performed By: Clinician Curtis Sites, RN Compression Type: Four Layer Pre Treatment ABI: 1.6 Notes patient has been tolerating compression without complications and arterial studies have been ordered Electronic Signature(s) Signed: 04/19/2018 5:45:37 PM By: Curtis Sites Entered By: Curtis Sites on 04/19/2018 09:56:41 Littlefield, Princessa R. ([id number]) -------------------------------------------------------------------------------- Compression Therapy Details Patient Name: Jessica Casey R. Date of Service: 04/19/2018 8:00 AM Medical Record Number: [id number] Patient Account Number: [id number] Date of Birth/Sex: [birth year] (37 y.o. F) Treating RN: Curtis Sites Primary Care Jamespaul Secrist: PATIENT, NO Other Clinician: Referring Deveon Kisiel: Referral, Self Treating Mana Morison/Extender: STONE III, HOYT Weeks in Treatment: 31 Compression Therapy Performed for Wound Assessment: Wound #5 Left,Anterior Lower  Leg Performed By: Clinician Curtis Sites, RN Compression Type: Four Layer Pre Treatment ABI: 1.6 Notes patient has been tolerating compression without complications and arterial studies have been ordered Electronic Signature(s) Signed: 04/19/2018 5:45:37 PM By: Curtis Sites Entered By: Curtis Sites on 04/19/2018 09:56:42 Samudio, Aylanie R. ([id number]) -------------------------------------------------------------------------------- Compression Therapy Details Patient Name: Jessica Casey R. Date of Service: 04/19/2018 8:00 AM Medical Record Number: [id number] Patient Account Number: [id number] Date of Birth/Sex: [birth year] (37 y.o. F) Treating RN: Curtis Sites Primary Care Mairead Schwarzkopf: PATIENT, NO Other Clinician: Referring Neenah Canter: Referral, Self Treating Jizelle Conkey/Extender: STONE III, HOYT Weeks in  Treatment: 31 Compression Therapy Performed for Wound Assessment: Wound #6 Left Lower Leg Performed By: Clinician Curtis Sites, RN Compression Type: Four Layer Pre Treatment ABI: 1.6 Notes patient has been tolerating compression without complications and arterial studies have been ordered Electronic Signature(s) Signed: 04/19/2018 5:45:37 PM By: Curtis Sites Entered By: Curtis Sites on 04/19/2018 09:56:42 Buzby, Gabryela R. ([id number]) -------------------------------------------------------------------------------- Encounter Discharge Information Details Patient Name: Jessica Casey R. Date of Service: 04/19/2018 8:00 AM Medical Record Number: [id number] Patient Account Number: [id number] Date of Birth/Sex: 1980-12-06 (37 y.o. F) Treating RN: Curtis Sites Primary Care [name]: PATIENT, NO Other Clinician: Referring Recardo Linn: Referral, Self Treating Mohammad Granade/Extender: STONE III, HOYT Weeks in Treatment: 31 Encounter Discharge Information Items Discharge Condition: Stable Ambulatory Status: Ambulatory Discharge Destination: Home Transportation: Private Auto Accompanied By:  self Schedule Follow-up Appointment: Yes Clinical Summary of Care: Electronic Signature(s) Signed: 04/19/2018 5:45:37 PM By: Curtis Sites Entered By: Curtis Sites on 04/19/2018 09:13:42 [name] ([id number]) -------------------------------------------------------------------------------- Patient/Caregiver Education Details Patient Name: [name] Date of Service: 04/19/2018 8:00 AM Medical Record Number: [id number] Patient Account Number: [id number] Date of Birth/Gender: March 13, 1981 (37 y.o. F) Treating RN: Curtis Sites Primary Care Physician: PATIENT, NO Other Clinician: Referring Physician: Referral, Self Treating Physician/Extender: Linwood Dibbles, HOYT Weeks in Treatment: 31 Education Assessment Education Provided To: Patient Education Topics Provided Wound/Skin Impairment: Handouts: Caring for Your Ulcer, Skin Care Do's and Dont's, Other: change your dressing as ordered Methods: Demonstration, Explain/Verbal Responses: State content correctly Electronic Signature(s) Signed: 04/19/2018 5:45:37 PM By: Curtis Sites Entered By: Curtis Sites on 04/19/2018 09:13:30 Dematteo, Codie R. ([id number]) -------------------------------------------------------------------------------- Wound Assessment Details Patient Name: Jessica Casey R. Date of Service: 04/19/2018 8:00 AM Medical Record Number: [id number] Patient Account Number: [id number] Date of Birth/Sex: 06-11-1981 (37 y.o. F) Treating RN: Phillis Haggis Primary Care Jeiry Birnbaum: PATIENT, NO Other Clinician: Referring Makenze Ellett: Referral, Self Treating Helton Oleson/Extender: STONE III, HOYT Weeks in Treatment: 31 Wound Status Wound Number: 2 Primary Etiology: Lymphedema Wound Location: Right Lower Leg - Lateral, Distal Wound Status: Open Wounding Event: Gradually Appeared Comorbid History: Lymphedema, Hypertension Date Acquired: 08/08/2017 Weeks Of Treatment: 31 Clustered Wound: Yes Wound Measurements Length: (cm)  6.7 Width: (cm) 6.1 Depth: (cm) 0.1 Area: (cm) 32.099 Volume: (cm) 3.21 % Reduction in Area: -49.7% % Reduction in Volume: 50.1% Epithelialization: None Tunneling: No Undermining: No Wound Description Full Thickness With Exposed Support Classification: Structures Wound Margin: Indistinct, nonvisible Exudate Large Amount: Exudate Type: Serous Exudate Color: amber Foul Odor After Cleansing: No Slough/Fibrino Yes Wound Bed Granulation Amount: Medium (34-66%) Exposed Structure Granulation Quality: Red, Pink Fascia Exposed: No Necrotic Amount: Medium (34-66%) Fat Layer (Subcutaneous Tissue) Exposed: No Necrotic Quality: Eschar, Adherent Slough Tendon Exposed: No Muscle Exposed: No Joint Exposed: No Bone Exposed: No Periwound Skin Texture Texture Color No Abnormalities Noted: No No Abnormalities Noted: No Callus: No Atrophie Blanche: No Crepitus: No Cyanosis: No Excoriation: Yes Ecchymosis: No Induration: No Erythema: No Rash: No Hemosiderin Staining: No Scarring: No Mottled: No Pallor: No Moisture Rubor: No No Abnormalities Noted: No Dry / Scaly: No Temperature / Pain Berkery, Valla R. ([id number]) Maceration: Yes Temperature: No Abnormality Tenderness on Palpation: Yes Wound Preparation Ulcer Cleansing: Other: soap and water, Topical Anesthetic Applied: None Treatment Notes Wound #2 (Right, Distal, Lateral Lower Leg) 1. Cleansed with: Clean wound with Normal Saline Cleanse wound with antibacterial soap and water 3. Peri-wound Care: Moisturizing lotion 4. Dressing Applied: Other dressing (specify in notes) 5. Secondary Dressing Applied ABD Pad 7. Secured with Tape 4 Layer Compression System -  Bilateral Notes silvercel, xtrasorb, unna to Ecologist) Signed: 04/19/2018 5:38:20 PM By: [name] Signed: 04/19/2018 5:45:37 PM By: Curtis Sites Entered By: Curtis Sites on 04/19/2018 09:53:43 Knierim, Yoana R.  ([id number]) -------------------------------------------------------------------------------- Wound Assessment Details Patient Name: Jessica Casey R. Date of Service: 04/19/2018 8:00 AM Medical Record Number: [id number] Patient Account Number: [id number] Date of Birth/Sex: 22-Dec-1980 (37 y.o. F) Treating RN: Phillis Haggis Primary Care Kamill Fulbright: PATIENT, NO Other Clinician: Referring Jaimey Franchini: Referral, Self Treating Daizee Firmin/Extender: STONE III, HOYT Weeks in Treatment: 31 Wound Status Wound Number: 3 Primary Etiology: Lymphedema Wound Location: Right, Anterior Lower Leg Wound Status: Healed - Epithelialized Wounding Event: Gradually Appeared Date Acquired: 04/07/2018 Weeks Of Treatment: 1 Clustered Wound: No Wound Measurements Length: (cm) 0 % Width: (cm) 0 % Depth: (cm) 0 Area: (cm) 0 Volume: (cm) 0 Reduction in Area: 100% Reduction in Volume: 100% Wound Description Classification: Partial Thickness Periwound Skin Texture Texture Color No Abnormalities Noted: No No Abnormalities Noted: No Moisture No Abnormalities Noted: No Electronic Signature(s) Signed: 04/19/2018 5:38:20 PM By: [name] Entered By: [name] on 04/19/2018 08:43:47 Sann, Hamdi R. ([id number]) -------------------------------------------------------------------------------- Wound Assessment Details Patient Name: Jessica Casey R. Date of Service: 04/19/2018 8:00 AM Medical Record Number: [id number] Patient Account Number: [id number] Date of Birth/Sex: 1981-04-08 (37 y.o. F) Treating RN: Phillis Haggis Primary Care Lafern Brinkley: PATIENT, NO Other Clinician: Referring Oney Folz: Referral, Self Treating Deryck Hippler/Extender: STONE III, HOYT Weeks in Treatment: 31 Wound Status Wound Number: 4 Primary Etiology: Lymphedema Wound Location: Right Lower Leg - Lateral, Distal Wound Status: Open Wounding Event: Gradually Appeared Comorbid History: Lymphedema, Hypertension Date Acquired:  04/07/2018 Weeks Of Treatment: 1 Clustered Wound: No Wound Measurements Length: (cm) 0.3 Width: (cm) 0.3 Depth: (cm) 0.1 Area: (cm) 0.071 Volume: (cm) 0.007 % Reduction in Area: 99.4% % Reduction in Volume: 99.4% Epithelialization: Large (67-100%) Tunneling: No Undermining: No Wound Description Classification: Partial Thickness Wound Margin: Flat and Intact Exudate Amount: Large Exudate Type: Serous Exudate Color: amber Foul Odor After Cleansing: No Slough/Fibrino Yes Wound Bed Granulation Amount: Large (67-100%) Exposed Structure Granulation Quality: Red, Pink Fascia Exposed: No Necrotic Amount: Small (1-33%) Fat Layer (Subcutaneous Tissue) Exposed: Yes Necrotic Quality: Adherent Slough Tendon Exposed: No Muscle Exposed: No Joint Exposed: No Bone Exposed: No Periwound Skin Texture Texture Color No Abnormalities Noted: No No Abnormalities Noted: No Callus: No Atrophie Blanche: No Crepitus: No Cyanosis: No Excoriation: No Ecchymosis: No Induration: No Erythema: No Rash: No Hemosiderin Staining: No Scarring: No Mottled: No Pallor: No Moisture Rubor: No No Abnormalities Noted: No Dry / Scaly: No Temperature / Pain Maceration: No Temperature: No Abnormality Tenderness on Palpation: Yes Pang, Kaliope R. ([id number]) Wound Preparation Ulcer Cleansing: Other: soap and water, Topical Anesthetic Applied: None Treatment Notes Wound #4 (Right, Distal, Lateral Lower Leg) 1. Cleansed with: Clean wound with Normal Saline Cleanse wound with antibacterial soap and water 3. Peri-wound Care: Moisturizing lotion 4. Dressing Applied: Other dressing (specify in notes) 5. Secondary Dressing Applied ABD Pad 7. Secured with Tape 4 Layer Compression System - Bilateral Notes silvercel, xtrasorb, unna to Ecologist) Signed: 04/19/2018 5:38:20 PM By: [name] Signed: 04/19/2018 5:45:37 PM By: Curtis Sites Entered By: Curtis Sites on  04/19/2018 09:54:11 Nesby, Tineshia R. ([id number]) -------------------------------------------------------------------------------- Wound Assessment Details Patient Name: Jessica Casey R. Date of Service: 04/19/2018 8:00 AM Medical Record Number: [id number] Patient Account Number: [id number] Date of Birth/Sex: Jan 28, 1981 (37 y.o. F) Treating RN: Phillis Haggis Primary Care Jo Booze: PATIENT, NO Other Clinician: Referring Abigael Mogle: Referral, Self Treating Shamirah Ivan/Extender: Larina Bras  III, HOYT Weeks in Treatment: 31 Wound Status Wound Number: 5 Primary Etiology: Lymphedema Wound Location: Left Lower Leg - Anterior Wound Status: Open Wounding Event: Gradually Appeared Comorbid History: Lymphedema, Hypertension Date Acquired: 04/07/2018 Weeks Of Treatment: 1 Clustered Wound: No Wound Measurements Length: (cm) 2.3 Width: (cm) 2.8 Depth: (cm) 0.1 Area: (cm) 5.058 Volume: (cm) 0.506 % Reduction in Area: 34.3% % Reduction in Volume: 86.9% Epithelialization: Large (67-100%) Tunneling: No Undermining: No Wound Description Full Thickness Without Exposed Support Foul Od Classification: Structures Slough/ Wound Margin: Flat and Intact Exudate Large Amount: Exudate Type: Serosanguineous Exudate Color: red, brown or After Cleansing: No Fibrino Yes Wound Bed Granulation Amount: Large (67-100%) Exposed Structure Granulation Quality: Red Fascia Exposed: No Necrotic Amount: Small (1-33%) Fat Layer (Subcutaneous Tissue) Exposed: Yes Necrotic Quality: Adherent Slough Tendon Exposed: No Muscle Exposed: No Joint Exposed: No Bone Exposed: No Periwound Skin Texture Texture Color No Abnormalities Noted: No No Abnormalities Noted: No Callus: No Atrophie Blanche: No Crepitus: No Cyanosis: No Excoriation: Yes Ecchymosis: No Induration: No Erythema: No Rash: No Hemosiderin Staining: No Scarring: No Mottled: No Pallor: No Moisture Rubor: No No Abnormalities Noted: No Dry /  Scaly: No Temperature / Pain Delmonaco, Montserrat R. ([id number]) Maceration: No Temperature: No Abnormality Tenderness on Palpation: Yes Wound Preparation Ulcer Cleansing: Rinsed/Irrigated with Saline, Other: soap and water, Topical Anesthetic Applied: None Treatment Notes Wound #5 (Left, Anterior Lower Leg) 1. Cleansed with: Clean wound with Normal Saline Cleanse wound with antibacterial soap and water 3. Peri-wound Care: Moisturizing lotion 4. Dressing Applied: Other dressing (specify in notes) 5. Secondary Dressing Applied ABD Pad 7. Secured with Tape 4 Layer Compression System - Bilateral Notes silvercel, xtrasorb, unna to Ecologist) Signed: 04/19/2018 5:38:20 PM By: [name] Signed: 04/19/2018 5:45:37 PM By: Curtis Sites Entered By: Curtis Sites on 04/19/2018 09:55:10 Despain, Kaijah R. ([id number]) -------------------------------------------------------------------------------- Wound Assessment Details Patient Name: Jessica Casey R. Date of Service: 04/19/2018 8:00 AM Medical Record Number: [id number] Patient Account Number: [id number] Date of Birth/Sex: 06/03/1981 (37 y.o. F) Treating RN: Phillis Haggis Primary Care Ryheem Jay: PATIENT, NO Other Clinician: Referring Thy Gullikson: Referral, Self Treating Amilio Zehnder/Extender: STONE III, HOYT Weeks in Treatment: 31 Wound Status Wound Number: 6 Primary Etiology: Lymphedema Wound Location: Left Lower Leg Wound Status: Open Wounding Event: Gradually Appeared Comorbid History: Lymphedema, Hypertension Date Acquired: 03/31/2018 Weeks Of Treatment: 1 Clustered Wound: No Wound Measurements Length: (cm) 0.6 Width: (cm) 0.6 Depth: (cm) 0.1 Area: (cm) 0.283 Volume: (cm) 0.028 % Reduction in Area: 72.7% % Reduction in Volume: 86.5% Epithelialization: Small (1-33%) Tunneling: No Undermining: No Wound Description Full Thickness Without Exposed Support Foul Od Classification: Structures Slough/ Wound  Margin: Flat and Intact Exudate Medium Amount: Exudate Type: Serosanguineous Exudate Color: red, brown or After Cleansing: No Fibrino Yes Wound Bed Granulation Amount: Large (67-100%) Exposed Structure Granulation Quality: Red Fascia Exposed: No Necrotic Amount: Small (1-33%) Fat Layer (Subcutaneous Tissue) Exposed: Yes Necrotic Quality: Adherent Slough Tendon Exposed: No Muscle Exposed: No Joint Exposed: No Bone Exposed: No Periwound Skin Texture Texture Color No Abnormalities Noted: No No Abnormalities Noted: No Callus: No Atrophie Blanche: No Crepitus: No Cyanosis: No Excoriation: No Ecchymosis: No Induration: No Erythema: No Rash: No Hemosiderin Staining: Yes Scarring: No Mottled: No Pallor: No Moisture Rubor: No No Abnormalities Noted: No Dry / Scaly: No Dominy, Kamry R. ([id number]) Maceration: No Wound Preparation Ulcer Cleansing: Rinsed/Irrigated with Saline, Other: soap and water, Topical Anesthetic Applied: None Treatment Notes Wound #6 (Left Lower Leg) 1. Cleansed with: Clean wound with  Normal Saline Cleanse wound with antibacterial soap and water 3. Peri-wound Care: Moisturizing lotion 4. Dressing Applied: Other dressing (specify in notes) 5. Secondary Dressing Applied ABD Pad 7. Secured with Tape 4 Layer Compression System - Bilateral Notes silvercel, xtrasorb, unna to Ecologistanchor Electronic Signature(s) Signed: 04/19/2018 5:38:20 PM By: Alejandro MullingPinkerton, Debra Signed: 04/19/2018 5:45:37 PM By: Curtis Sitesorthy, Joanna Entered By: Curtis Sitesorthy, Joanna on 04/19/2018 09:55:37

## 2018-04-23 ENCOUNTER — Encounter: Payer: Self-pay | Attending: Physician Assistant | Admitting: Physician Assistant

## 2018-04-23 DIAGNOSIS — I10 Essential (primary) hypertension: Secondary | ICD-10-CM | POA: Insufficient documentation

## 2018-04-23 DIAGNOSIS — I890 Lymphedema, not elsewhere classified: Secondary | ICD-10-CM | POA: Insufficient documentation

## 2018-04-23 DIAGNOSIS — I87311 Chronic venous hypertension (idiopathic) with ulcer of right lower extremity: Secondary | ICD-10-CM | POA: Insufficient documentation

## 2018-04-23 DIAGNOSIS — L97812 Non-pressure chronic ulcer of other part of right lower leg with fat layer exposed: Secondary | ICD-10-CM | POA: Insufficient documentation

## 2018-04-23 DIAGNOSIS — L97822 Non-pressure chronic ulcer of other part of left lower leg with fat layer exposed: Secondary | ICD-10-CM | POA: Insufficient documentation

## 2018-04-23 DIAGNOSIS — Z6841 Body Mass Index (BMI) 40.0 and over, adult: Secondary | ICD-10-CM | POA: Insufficient documentation

## 2018-04-24 NOTE — Progress Notes (Signed)
[name], [name] ([id number]) Visit Report for 04/23/2018 Arrival Information Details Patient Name: [name], [name] Date of Service: 04/23/2018 3:30 PM Medical Record Number: [id number] Patient Account Number: [id number] Date of Birth/Sex: [birth year] (37 y.o. F) Treating RN: Curtis Sites Primary Care Ashana Tullo: PATIENT, NO Other Clinician: Referring Saranda Legrande: Referral, Self Treating Claudia Greenley/Extender: STONE III, HOYT Weeks in Treatment: 32 Visit Information History Since Last Visit Added or deleted any medications: No Patient Arrived: Ambulatory Any new allergies or adverse reactions: No Arrival Time: 15:44 Had a fall or experienced change in No Accompanied By: self activities of daily living that may affect Transfer Assistance: None risk of falls: Patient Identification Verified: Yes Signs or symptoms of abuse/neglect since last visito No Secondary Verification Process Completed: Yes Hospitalized since last visit: No Patient Requires Transmission-Based No Implantable device outside of the clinic excluding No Precautions: cellular tissue based products placed in the center Patient Has Alerts: No since last visit: Has Dressing in Place as Prescribed: Yes Has Compression in Place as Prescribed: Yes Pain Present Now: No Electronic Signature(s) Signed: 04/23/2018 5:22:13 PM By: Curtis Sites Entered By: Curtis Sites on 04/23/2018 15:45:19 [name], [name] Kitchen ([id number]) -------------------------------------------------------------------------------- Encounter Discharge Information Details Patient Name: Jessica Casey R. Date of Service: 04/23/2018 3:30 PM Medical Record Number: [id number] Patient Account Number: [id number] Date of Birth/Sex: 24-Jan-1981 (36 y.o. F) Treating RN: Curtis Sites Primary Care Shaquoya Cosper: PATIENT, NO Other Clinician: Referring Keva Darty: Referral, Self Treating Jaylei Fuerte/Extender: STONE III, HOYT Weeks in Treatment: 32 Encounter Discharge Information  Items Discharge Condition: Stable Ambulatory Status: Ambulatory Discharge Destination: Home Transportation: Private Auto Accompanied By: self Schedule Follow-up Appointment: Yes Clinical Summary of Care: Electronic Signature(s) Signed: 04/23/2018 5:07:37 PM By: Curtis Sites Entered By: Curtis Sites on 04/23/2018 17:07:37 [name], [name] ([id number]) -------------------------------------------------------------------------------- Lower Extremity Assessment Details Patient Name: Jessica Casey R. Date of Service: 04/23/2018 3:30 PM Medical Record Number: [id number] Patient Account Number: [id number] Date of Birth/Sex: 1981-04-25 (37 y.o. F) Treating RN: Curtis Sites Primary Care Isamu Trammel: PATIENT, NO Other Clinician: Referring Charolette Bultman: Referral, Self Treating Meila Berke/Extender: STONE III, HOYT Weeks in Treatment: 32 Edema Assessment Assessed: [Left: No] [Right: No] [Left: Edema] [Right: :] Calf Left: Right: Point of Measurement: 36 cm From Medial Instep 56.6 cm 58.3 cm Ankle Left: Right: Point of Measurement: 9 cm From Medial Instep 36.8 cm 36.2 cm Vascular Assessment Pulses: Dorsalis Pedis Palpable: [Left:Yes] [Right:Yes] Posterior Tibial Extremity colors, hair growth, and conditions: Extremity Color: [Left:Hyperpigmented] [Right:Hyperpigmented] Hair Growth on Extremity: [Left:Yes] [Right:Yes] Temperature of Extremity: [Left:Warm] [Right:Warm] Capillary Refill: [Left:< 3 seconds] [Right:< 3 seconds] Toe Nail Assessment Left: Right: Thick: Yes Yes Discolored: Yes Yes Deformed: Yes Yes Improper Length and Hygiene: Yes Yes Electronic Signature(s) Signed: 04/23/2018 5:22:13 PM By: Curtis Sites Entered By: Curtis Sites on 04/23/2018 15:57:34 [name], [name] ([id number]) -------------------------------------------------------------------------------- Multi Wound Chart Details Patient Name: Jessica Casey R. Date of Service: 04/23/2018 3:30 PM Medical Record Number:  [id number] Patient Account Number: [id number] Date of Birth/Sex: 02-Oct-1981 (37 y.o. F) Treating RN: Phillis Haggis Primary Care Charlotte Fidalgo: PATIENT, NO Other Clinician: Referring Alistar Mcenery: Referral, Self Treating Cornelious Bartolucci/Extender: STONE III, HOYT Weeks in Treatment: 32 Vital Signs Height(in): 74 Pulse(bpm): 95 Weight(lbs): 486 Blood Pressure(mmHg): 164/88 Body Mass Index(BMI): 62 Temperature(F): 97.9 Respiratory Rate 20 (breaths/min): Photos: Wound Location: Right, Distal, Lateral Lower Right, Distal, Lateral Lower Left, Anterior Lower Leg Leg Leg Wounding Event: Gradually Appeared Gradually Appeared Gradually Appeared Primary Etiology: Lymphedema Lymphedema Lymphedema Date Acquired: 08/08/2017 04/07/2018 04/07/2018 Weeks of Treatment: 32 2 2 Wound Status: Open Open Open  Clustered Wound: Yes No No Measurements L x W x D 5.5x5x0.1 0.2x0.2x0.1 2.2x2.7x0.1 (cm) Area (cm) : 21.598 0.031 4.665 Volume (cm) : 2.16 0.003 0.467 % Reduction in Area: -0.70% 99.70% 39.40% % Reduction in Volume: 66.40% 99.70% 87.90% Classification: Full Thickness With Exposed Partial Thickness Full Thickness Without Support Structures Exposed Support Structures Periwound Skin Texture: No Abnormalities Noted No Abnormalities Noted No Abnormalities Noted Periwound Skin Moisture: No Abnormalities Noted No Abnormalities Noted No Abnormalities Noted Periwound Skin Color: No Abnormalities Noted No Abnormalities Noted No Abnormalities Noted Tenderness on Palpation: No No No Wound Number: 6 N/A N/A Photos: N/A N/A ALLEYA, DEMETER ([id number]) Wound Location: Left Lower Leg N/A N/A Wounding Event: Gradually Appeared N/A N/A Primary Etiology: Lymphedema N/A N/A Date Acquired: 03/31/2018 N/A N/A Weeks of Treatment: 2 N/A N/A Wound Status: Open N/A N/A Clustered Wound: No N/A N/A Measurements L x W x D 0.3x0.3x0.1 N/A N/A (cm) Area (cm) : 0.071 N/A N/A Volume (cm) : 0.007 N/A N/A % Reduction in Area:  93.20% N/A N/A % Reduction in Volume: 96.60% N/A N/A Classification: Full Thickness Without N/A N/A Exposed Support Structures Periwound Skin Texture: No Abnormalities Noted N/A N/A Periwound Skin Moisture: No Abnormalities Noted N/A N/A Periwound Skin Color: No Abnormalities Noted N/A N/A Tenderness on Palpation: No N/A N/A Treatment Notes Electronic Signature(s) Signed: 04/23/2018 5:20:44 PM By: [name] Entered By: [name] on 04/23/2018 16:18:11 Derouin, Cristal Deer ([id number]) -------------------------------------------------------------------------------- Multi-Disciplinary Care Plan Details Patient Name: Jessica Casey R. Date of Service: 04/23/2018 3:30 PM Medical Record Number: [id number] Patient Account Number: [id number] Date of Birth/Sex: 01-23-1981 (37 y.o. F) Treating RN: Phillis Haggis Primary Care Milana Salay: PATIENT, NO Other Clinician: Referring Chan Rosasco: Referral, Self Treating Lachlan Pelto/Extender: STONE III, HOYT Weeks in Treatment: 32 Active Inactive ` Orientation to the Wound Care Program Nursing Diagnoses: Knowledge deficit related to the wound healing center program Goals: Patient/caregiver will verbalize understanding of the Wound Healing Center Program Date Initiated: 09/10/2017 Target Resolution Date: 11/23/2017 Goal Status: Active Interventions: Provide education on orientation to the wound center Notes: ` Venous Leg Ulcer Nursing Diagnoses: Knowledge deficit related to disease process and management Goals: Patient/caregiver will verbalize understanding of disease process and disease management Date Initiated: 09/10/2017 Target Resolution Date: 11/23/2017 Goal Status: Active Interventions: Assess peripheral edema status every visit. Notes: ` Wound/Skin Impairment Nursing Diagnoses: Impaired tissue integrity Goals: Ulcer/skin breakdown will heal within 14 weeks Date Initiated: 09/10/2017 Target Resolution Date: 11/24/2017 Goal Status:  Active Interventions: SHONIQUE, PELPHREY ([id number]) Assess patient/caregiver ability to obtain necessary supplies Assess patient/caregiver ability to perform ulcer/skin care regimen upon admission and as needed Assess ulceration(s) every visit Notes: Electronic Signature(s) Signed: 04/23/2018 5:20:44 PM By: [name] Entered By: [name] on 04/23/2018 16:18:00 [name], [name] ([id number]) -------------------------------------------------------------------------------- Pain Assessment Details Patient Name: Jessica Casey R. Date of Service: 04/23/2018 3:30 PM Medical Record Number: [id number] Patient Account Number: [id number] Date of Birth/Sex: 1981/03/24 (37 y.o. F) Treating RN: Curtis Sites Primary Care Allyssia Skluzacek: PATIENT, NO Other Clinician: Referring Donnarae Rae: Referral, Self Treating Haya Hemler/Extender: STONE III, HOYT Weeks in Treatment: 32 Active Problems Location of Pain Severity and Description of Pain Patient Has Paino No Site Locations Pain Management and Medication Current Pain Management: Electronic Signature(s) Signed: 04/23/2018 5:22:13 PM By: Curtis Sites Entered By: Curtis Sites on 04/23/2018 15:45:28 Hangartner, Cristal Deer ([id number]) -------------------------------------------------------------------------------- Patient/Caregiver Education Details Patient Name: Jessica Casey R. Date of Service: 04/23/2018 3:30 PM Medical Record Number: [id number] Patient Account Number: [id number] Date of Birth/Gender: September 09, 1981 (37 y.o. F)  Treating RN: Curtis Sites Primary Care Physician: PATIENT, NO Other Clinician: Referring Physician: Referral, Self Treating Physician/Extender: Linwood Dibbles, HOYT Weeks in Treatment: 32 Education Assessment Education Provided To: Patient Education Topics Provided Venous: Handouts: Other: leg elevation Methods: Explain/Verbal Responses: State content correctly Electronic Signature(s) Signed: 04/23/2018 5:22:13 PM By: Curtis Sites Entered By: Curtis Sites on 04/23/2018 17:08:00 [name], [name] ([id number]) -------------------------------------------------------------------------------- Wound Assessment Details Patient Name: Jessica Casey R. Date of Service: 04/23/2018 3:30 PM Medical Record Number: [id number] Patient Account Number: [id number] Date of Birth/Sex: 06/16/1981 (37 y.o. F) Treating RN: Curtis Sites Primary Care Jayceon Troy: PATIENT, NO Other Clinician: Referring Noura Purpura: Referral, Self Treating Tyisha Cressy/Extender: STONE III, HOYT Weeks in Treatment: 32 Wound Status Wound Number: 2 Primary Etiology: Lymphedema Wound Location: Right, Distal, Lateral Lower Leg Wound Status: Open Wounding Event: Gradually Appeared Date Acquired: 08/08/2017 Weeks Of Treatment: 32 Clustered Wound: Yes Photos Photo Uploaded By: Curtis Sites on 04/23/2018 16:07:34 Wound Measurements Length: (cm) 5.5 Width: (cm) 5 Depth: (cm) 0.1 Area: (cm) 21.598 Volume: (cm) 2.16 % Reduction in Area: -0.7% % Reduction in Volume: 66.4% Wound Description Full Thickness With Exposed Support Classification: Structures Periwound Skin Texture Texture Color No Abnormalities Noted: No No Abnormalities Noted: No Moisture No Abnormalities Noted: No Electronic Signature(s) Signed: 04/23/2018 5:22:13 PM By: Curtis Sites Entered By: Curtis Sites on 04/23/2018 16:03:06 [name], [name] ([id number]) -------------------------------------------------------------------------------- Wound Assessment Details Patient Name: Jessica Casey R. Date of Service: 04/23/2018 3:30 PM Medical Record Number: [id number] Patient Account Number: [id number] Date of Birth/Sex: [birth year] (37 y.o. F) Treating RN: Curtis Sites Primary Care Leonela Kivi: PATIENT, NO Other Clinician: Referring Brentt Fread: Referral, Self Treating Lakela Kuba/Extender: STONE III, HOYT Weeks in Treatment: 32 Wound Status Wound Number: 4 Primary Etiology: Lymphedema Wound  Location: Right, Distal, Lateral Lower Leg Wound Status: Open Wounding Event: Gradually Appeared Date Acquired: 04/07/2018 Weeks Of Treatment: 2 Clustered Wound: No Photos Photo Uploaded By: Curtis Sites on 04/23/2018 16:07:35 Wound Measurements Length: (cm) 0.2 Width: (cm) 0.2 Depth: (cm) 0.1 Area: (cm) 0.031 Volume: (cm) 0.003 % Reduction in Area: 99.7% % Reduction in Volume: 99.7% Wound Description Classification: Partial Thickness Periwound Skin Texture Texture Color No Abnormalities Noted: No No Abnormalities Noted: No Moisture No Abnormalities Noted: No Treatment Notes Wound #4 (Right, Distal, Lateral Lower Leg) 1. Cleansed with: Clean wound with Normal Saline Cleanse wound with antibacterial soap and water 3. Peri-wound Care: Moisturizing lotion Pallett, Carletta R. ([id number]) 4. Dressing Applied: Hydrafera Blue 5. Secondary Dressing Applied ABD Pad 7. Secured with Tape 4 Layer Compression System - Bilateral Notes xtrasorb, unna to Ecologist) Signed: 04/23/2018 5:22:13 PM By: Curtis Sites Entered By: Curtis Sites on 04/23/2018 16:03:07 [name], [name] ([id number]) -------------------------------------------------------------------------------- Wound Assessment Details Patient Name: Jessica Casey R. Date of Service: 04/23/2018 3:30 PM Medical Record Number: [id number] Patient Account Number: [id number] Date of Birth/Sex: 10/20/1981 (37 y.o. F) Treating RN: Curtis Sites Primary Care Joseff Luckman: PATIENT, NO Other Clinician: Referring Lorieann Argueta: Referral, Self Treating Weltha Cathy/Extender: STONE III, HOYT Weeks in Treatment: 32 Wound Status Wound Number: 5 Primary Etiology: Lymphedema Wound Location: Left, Anterior Lower Leg Wound Status: Open Wounding Event: Gradually Appeared Date Acquired: 04/07/2018 Weeks Of Treatment: 2 Clustered Wound: No Photos Photo Uploaded By: Curtis Sites on 04/23/2018 16:07:53 Wound Measurements Length:  (cm) 2.2 Width: (cm) 2.7 Depth: (cm) 0.1 Area: (cm) 4.665 Volume: (cm) 0.467 % Reduction in Area: 39.4% % Reduction in Volume: 87.9% Wound Description Full Thickness Without Exposed Support Classification: Structures Periwound Skin Texture Texture Color No Abnormalities Noted: No No  Abnormalities Noted: No Moisture No Abnormalities Noted: No Treatment Notes Wound #5 (Left, Anterior Lower Leg) 1. Cleansed with: Clean wound with Normal Saline Cleanse wound with antibacterial soap and water 3. Peri-wound Care: Moisturizing lotion Simson, [name] ([id number]) 4. Dressing Applied: Hydrafera Blue 5. Secondary Dressing Applied ABD Pad 7. Secured with Tape 4 Layer Compression System - Bilateral Notes xtrasorb, unna to Ecologistanchor Electronic Signature(s) Signed: 04/23/2018 5:22:13 PM By: Curtis Sitesorthy, [name] Entered By: Curtis Sitesorthy, [name] on 04/23/2018 16:03:07 [name], [name] ([id number]) -------------------------------------------------------------------------------- Wound Assessment Details Patient Name: Jessica CaseyLEATH, Nayara R. Date of Service: 04/23/2018 3:30 PM Medical Record Number: [id number] Patient Account Number: [id number] Date of Birth/Sex: 08-26-1981 (37 y.o. F) Treating RN: Curtis Sitesorthy, [name] Primary Care Cyle Kenyon: PATIENT, NO Other Clinician: Referring Ad Guttman: Referral, Self Treating Johndavid Geralds/Extender: STONE III, HOYT Weeks in Treatment: 32 Wound Status Wound Number: 6 Primary Etiology: Lymphedema Wound Location: Left Lower Leg Wound Status: Open Wounding Event: Gradually Appeared Date Acquired: 03/31/2018 Weeks Of Treatment: 2 Clustered Wound: No Photos Photo Uploaded By: Curtis Sitesorthy, [name] on 04/23/2018 16:07:53 Wound Measurements Length: (cm) 0.3 Width: (cm) 0.3 Depth: (cm) 0.1 Area: (cm) 0.071 Volume: (cm) 0.007 % Reduction in Area: 93.2% % Reduction in Volume: 96.6% Wound Description Full Thickness Without Exposed Support Classification: Structures Periwound Skin  Texture Texture Color No Abnormalities Noted: No No Abnormalities Noted: No Moisture No Abnormalities Noted: No Electronic Signature(s) Signed: 04/23/2018 5:22:13 PM By: Curtis Sitesorthy, [name] Entered By: Curtis Sitesorthy, [name] on 04/23/2018 16:03:08 Bonneau, Cristal DeerERICA R. ([id number]) -------------------------------------------------------------------------------- Vitals Details Patient Name: Jessica CaseyLEATH, Odessia R. Date of Service: 04/23/2018 3:30 PM Medical Record Number: [id number] Patient Account Number: [id number] Date of Birth/Sex: 08-26-1981 (37 y.o. F) Treating RN: Curtis Sitesorthy, [name] Primary Care Aryianna Earwood: PATIENT, NO Other Clinician: Referring Maragret Vanacker: Referral, Self Treating Sanel Stemmer/Extender: STONE III, HOYT Weeks in Treatment: 32 Vital Signs Time Taken: 15:45 Temperature (F): 97.9 Height (in): 74 Pulse (bpm): 95 Weight (lbs): 486 Respiratory Rate (breaths/min): 20 Body Mass Index (BMI): 62.4 Blood Pressure (mmHg): 164/88 Reference Range: 80 - 120 mg / dl Electronic Signature(s) Signed: 04/23/2018 5:22:13 PM By: Curtis Sitesorthy, [name] Entered By: Curtis Sitesorthy, [name] on 04/23/2018 15:49:19

## 2018-04-26 NOTE — Progress Notes (Signed)
[name], [name] ([id number]) Visit Report for 04/23/2018 Chief Complaint Document Details Patient Name: [name], [name] Date of Service: 04/23/2018 3:30 PM Medical Record Number: [id number] Patient Account Number: [id number] Date of Birth/Sex: 1981/04/14 (37 y.o. F) Treating RN: Phillis Haggis Primary Care Provider: PATIENT, NO Other Clinician: Referring Provider: Referral, Self Treating Provider/Extender: STONE III, HOYT Weeks in Treatment: 32 Information Obtained from: Patient Chief Complaint She is here in follow up evaluation of right lower extremity ulcers Electronic Signature(s) Signed: 04/24/2018 8:38:06 AM By: Lenda Kelp PA-C Entered By: Lenda Kelp on 04/23/2018 15:50:45 Whidden, Dynastie R. ([id number]) -------------------------------------------------------------------------------- Debridement Details Patient Name: Jessica Casey R. Date of Service: 04/23/2018 3:30 PM Medical Record Number: [id number] Patient Account Number: [id number] Date of Birth/Sex: 06/18/1981 (37 y.o. F) Treating RN: Phillis Haggis Primary Care Provider: PATIENT, NO Other Clinician: Referring Provider: Referral, Self Treating Provider/Extender: STONE III, HOYT Weeks in Treatment: 32 Debridement Performed for Wound #5 Left,Anterior Lower Leg Assessment: Performed By: Physician STONE III, HOYT E., PA-C Debridement Type: Debridement Pre-procedure Verification/Time Yes - 16:21 Out Taken: Start Time: 16:21 Pain Control: Lidocaine 4% Topical Solution Total Area Debrided (L x W): 2.2 (cm) x 2.7 (cm) = 5.94 (cm) Tissue and other material Viable, Non-Viable, Slough, Subcutaneous, Fibrin/Exudate, Slough debrided: Level: Skin/Subcutaneous Tissue Debridement Description: Excisional Instrument: Curette Bleeding: Minimum Hemostasis Achieved: Pressure Procedural Pain: 0 Post Procedural Pain: 0 Response to Treatment: Procedure was tolerated well Level of Consciousness: Awake and Alert Post Procedure  Vitals: Temperature: 97.9 Pulse: 95 Respiratory Rate: 20 Blood Pressure: Systolic Blood Pressure: 164 Diastolic Blood Pressure: 88 Post Debridement Measurements of Total Wound Length: (cm) 2.2 Width: (cm) 2.7 Depth: (cm) 0.2 Volume: (cm) 0.933 Character of Wound/Ulcer Post Debridement: Requires Further Debridement Post Procedure Diagnosis Same as Pre-procedure Electronic Signature(s) Signed: 04/23/2018 5:20:44 PM By: [name] Signed: 04/24/2018 8:38:06 AM By: Lenda Kelp PA-C Entered By: [name] on 04/23/2018 16:22:18 [name], [name] ([id number]) -------------------------------------------------------------------------------- HPI Details Patient Name: Jessica Casey R. Date of Service: 04/23/2018 3:30 PM Medical Record Number: [id number] Patient Account Number: [id number] Date of Birth/Sex: [birth year] (37 y.o. F) Treating RN: Phillis Haggis Primary Care Provider: PATIENT, NO Other Clinician: Referring Provider: Referral, Self Treating Provider/Extender: STONE III, HOYT Weeks in Treatment: 32 History of Present Illness HPI Description: 37 year old patient well known to our [facility] wound care clinic where she has been seen since 2016 for bilateral lower extremity venous insufficiency disease with lymphedema and multiple ulcerations associated with morbid obesity. she had custom-made compression stockings and lymphedema pumps which were used in the past. most recently she was admitted to the hospital between October 11 and 09/02/2017 with sepsis, lower extremity wounds and lymphedema.she was initially treated in the outpatient with Keflex and Bactrim. she was initially treated in the hospital with vancomycin and Zosyn and changed over to Unasyn until her white count improved and her blood cultures were negative for 3 days. After her inpatient management she was discharged home on Augmentin to end on 09/13/2017 with a 14 day course. she has had outpatient vascular  duplex scans completed in November 2017 and her right ABI was 1.1 and the left ABI is 1.3. she had normal toe brachial indices bilaterally.she had three-vessel runoff in the right lower extremity and two-vessel runoff in the left lower extremity. On questioning the patient she does have custom made compression stockings and also has a lymphedema pump but has not been using it appropriately and has not been taking good care of herself. 09/17/2017 -- she  returns today with compression stockings on the left side and the right side has had significant amount of drainage and has a very strong odor 09/24/2017 -- the drainage is increased significantly and she has more lymphedema and a very strong odor to her wound. Though she does not have systemic symptoms, or overt infection I believe she will benefit from some doxycycline given empirically. 10/01/2017 -- after starting the doxycycline and changing the dressing twice a week her symptoms and signs have definitely improved overall. 10/08/2017 -- she has completed her course of doxycycline but continues to have a lot of drainage and needs twice a week dressing changes. 11/08/17-she is here in follow-up evaluation for right lower extremity ulcers. She admits to using her lymphedema pumps twice daily, one hour per session. she is voicing no complaints or concerns, no signs of infection will change to [facility] 12/14/17 on evaluation today patient appears to be doing very well in regard to her wounds. She has been tolerating the dressing changes she continues to develop some portly the adherent granular tissue on the surface of the wound with some Slough. Obviously we are trying to get too much better wound bed. With that being said the hyper granulation the Hydrofera Blue Dressing to have helped with which is excellent news. However I think it may be time to try something a little bit different at this point. 01/11/18 on evaluation today patient appears to be  doing fairly well in regard to her right lateral lower extremity ulcers. This shows excellent signs of filling in which is great news. There does not appear to be any evidence of infection which is also good news. She does continue to work as well is good school. She is having no pain. 01/22/18 on evaluation today patient appears to be doing a little bit worse in my opinion in regard to the overall quality of that granulation on her right lower extremity. She was not here last week due to being sick with a stomach virus this may have something to do with the fact that her wound appears to be a little bit worse. With that being said I'm also thinking that after switching from the [facility] Dressing to the silver collagen would really has not looked that's good in my opinion. We may want to swit 02/11/18 on evaluation today patient's right lower/lateral lower extremity ulcers appear to be doing very well at this point. [name], [name] ([id number]) Especially the more proximal ulcer has filled in much closer to surface which is good news. Nonetheless both show signs of improvement which is great news. There does not appear to be any evidence of infection which is also good news. In general patient has been doing well tolerating the wraps as well as the [employer]. 02/18/18 on evaluation today patient appears to be doing a little bit worse in regard to the periwound region the wounds themselves do not look much deteriorated to me. With that being said she has several small blisters/pustules noted in the periwound and there was a significant amount of drainage and maceration compared to previous. There has been a time that we had to bring her back for twice a week dressing changes as far as her wrap was concerned it has been a while since we've done that however. With that being said the patient has been having some burning and in general I'm concerned about the possibility of infection. She  has previously taken doxycycline with good result. Fortunately there  does not appear to be any evidence of overall worsening in regard to the size of the wound and in fact the upper wound actually appears to be showing signs of good epithelialization. 03/18/18 on evaluation today patient appears to be doing excellent in regard to her right lateral lower extremity ulcer. She has been tolerating the dressing changes without complication. Fortunately this seems to be making great progress. Overall I see no signs of infection and there is dramatic improvement overall even compared to last week. 03/28/18 on evaluation today patient appears to be doing very well in regard to her right lateral lower surety ulcer. She has been tolerating the dressing changes without complication at this point. She states currently that she's having no significant discomfort which is excellent as well. Overall I'm pleased with how things seem to be progressing. 04/01/18 on evaluation today patient appears to be doing excellent in regard to her right lateral lower extremity ulcers. She has been tolerating the dressing changes without complication which is good news. With that being said the wraps still continues to show signs of helping with her fluid she does have Juxta-Lite compression stockings for when we are done with the current treatment regimen once everything heals. Mainly she just has the one area still remaining the smaller of the two wings is pretty much closed at this point there's just a very slight opening noted. 04/08/18 on evaluation today patient appears to be doing better in regard to the original wound on the right lateral lower extremity that we have been managing. Unfortunately she has a new area of weeping more anterior on the right lateral lower extremity and on the left lower extremity she has two new ulcers there appears to be some cellulitis noted at this time. I am concerned about the fact that this may  in fact be an infection that has caused the worsening and swelling in the past this has been the case when we previously attempted to determine what was going on when she had down slides like this. With that being said the patient is seeming to tolerate the wraps fairly well for the most part. 04/17/18; since last time the patient was seen in this clinic she was hospitalized from 04/11/18 through 04/13/18; she presented with bilateral lower extremity pain worse on the right and a fever of up to 104. Noteworthy that when she was in the clinic last week she had new wounds on the left leg culture grew MRSA and she was prescribed Bactrim. She had 2 days of IV Vanco and Zosyn in the hospital. Her blood cultures were negative. She was discharged on Bactrim to cover the original MRSA on the right leg and Keflex to cover the possibility of strep. The hospitalist had a conversation with infectious disease. The patient arrives in clinic today for nurse check however given the recent hospitalization I was asked to look at her. The patient states she feels a lot better. No fever or chills. Still some pain in the right calf but a lot better. She arrived in the hospital with a white count of 15.6, the next day was 10.8. Comprehensive metabolic panel was normal. She is still taking Keflex and Bactrim It would appear that she had a surgical IandD at the bedside of the right calf felt to have a underlying abscess. According to our intake nurse the wound has expanded quite bit on the right lateral calf. She has no open area on the left anterior and left posterior calf  as described last time 04/23/18 on evaluation today patient actually appears to be doing much better than when I last saw her. This obviously has been a couple weeks ago and in the interim she was admitted to the hospital for IV antibiotic therapy due to cellulitis, discharge, and fortunately the substance which hadn't sued has completely resolved. Her  swelling seems to be better in regard to her lower extremities as well and the wounds that opened up as results of the infection seems to be showing signs of improvement. There is some Slough noted on the left lateral wound otherwise a lot of weeping noted of the right lateral leg. Electronic Signature(s) Signed: 04/24/2018 8:38:06 AM By: Lenda Kelp PA-C Entered By: Lenda Kelp on 04/24/2018 08:17:46 [name], [name] ([id number]) [name] ([id number]) -------------------------------------------------------------------------------- Physical Exam Details Patient Name: Jessica Casey R. Date of Service: 04/23/2018 3:30 PM Medical Record Number: [id number] Patient Account Number: [id number] Date of Birth/Sex: 10/28/1981 (37 y.o. F) Treating RN: Phillis Haggis Primary Care Provider: PATIENT, NO Other Clinician: Referring Provider: Referral, Self Treating Provider/Extender: STONE III, HOYT Weeks in Treatment: 32 Constitutional Obese and well-hydrated in no acute distress. Respiratory normal breathing without difficulty. Psychiatric this patient is able to make decisions and demonstrates good insight into disease process. Alert and Oriented x 3. pleasant and cooperative. Notes Patient's wound on the right does not show much in the way of significant slough which is good news. There does however appear to be a lot of drainage and weeping noted currently. I do believe that the patient is tolerating the wraps very well and overall I think that we may want to switch back to [facility] Dressing that she's done even better with this in the past. We tried other dressings but she just tends to always come back to this being the best. Subsequently in regard to left lower extremity this did require sharp debridement today which was performed without complication and with minimal discomfort for the patient. She tolerated this in an excellent fashion and post debridement the wound bed appears  to be doing much better. Electronic Signature(s) Signed: 04/24/2018 8:38:06 AM By: Lenda Kelp PA-C Entered By: Lenda Kelp on 04/24/2018 08:65:78 [name] ([id number]) -------------------------------------------------------------------------------- Physician Orders Details Patient Name: Jessica Casey R. Date of Service: 04/23/2018 3:30 PM Medical Record Number: [id number] Patient Account Number: [id number] Date of Birth/Sex: 23-Feb-1981 (37 y.o. F) Treating RN: Phillis Haggis Primary Care Provider: PATIENT, NO Other Clinician: Referring Provider: Referral, Self Treating Provider/Extender: STONE III, HOYT Weeks in Treatment: 32 Verbal / Phone Orders: Yes Clinician: Ashok Cordia, Debi Read Back and Verified: Yes Diagnosis Coding ICD-10 Coding Code Description [id number] 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d L97.221 Non-pressure chronic ulcer of left calf limited to breakdown of skin I89.0 Lymphedema, not elsewhere classified I87.311 Chronic venous hypertension (idiopathic) with ulcer of right lower extremity E66.01 Morbid (severe) obesity due to excess calories Wound Cleansing Wound #2 Right,Distal,Lateral Lower Leg o Clean wound with Normal Saline. o Cleanse wound with mild soap and water Wound #4 Right,Distal,Lateral Lower Leg o Clean wound with Normal Saline. o Cleanse wound with mild soap and water Wound #5 Left,Anterior Lower Leg o Clean wound with Normal Saline. o Cleanse wound with mild soap and water Wound #6 Left Lower Leg o Clean wound with Normal Saline. o Cleanse wound with mild soap and water Anesthetic (add to Medication List) Wound #2 Right,Distal,Lateral Lower Leg o Topical Lidocaine 4% cream applied to  wound bed prior to debridement (In Clinic Only). Wound #4 Right,Distal,Lateral Lower Leg o Topical Lidocaine 4% cream applied to wound bed prior to debridement (In Clinic Only). Wound #5 Left,Anterior Lower  Leg o Topical Lidocaine 4% cream applied to wound bed prior to debridement (In Clinic Only). Wound #6 Left Lower Leg o Topical Lidocaine 4% cream applied to wound bed prior to debridement (In Clinic Only). Skin Barriers/Peri-Wound Care o Moisturizing lotion Primary Wound Dressing Wound #2 Right,Distal,Lateral Lower Leg Harding, Promise R. ([id number]) o Hydrafera Blue Ready Transfer Wound #4 Right,Distal,Lateral Lower Leg o Hydrafera Blue Ready Transfer Wound #5 Left,Anterior Lower Leg o Hydrafera Blue Ready Transfer Wound #6 Left Lower Leg o Hydrafera Blue Ready Transfer Secondary Dressing Wound #2 Right,Distal,Lateral Lower Leg o ABD pad o XtraSorb Wound #4 Right,Distal,Lateral Lower Leg o ABD pad o XtraSorb Wound #5 Left,Anterior Lower Leg o ABD pad o XtraSorb Wound #6 Left Lower Leg o ABD pad o XtraSorb Dressing Change Frequency Wound #2 Right,Distal,Lateral Lower Leg o Other: - twice weekly, return for nurse visit friday Wound #4 Right,Distal,Lateral Lower Leg o Other: - twice weekly, return for nurse visit friday Wound #5 Left,Anterior Lower Leg o Other: - twice weekly, return for nurse visit friday Wound #6 Left Lower Leg o Other: - twice weekly, return for nurse visit friday Follow-up Appointments Wound #2 Right,Distal,Lateral Lower Leg o Return Appointment in 1 week. o Nurse Visit as needed o Other: - twice weekly clinic visit, return on friday for nurse visit Wound #4 Right,Distal,Lateral Lower Leg o Return Appointment in 1 week. o Nurse Visit as needed o Other: - twice weekly clinic visit, return on friday for nurse visit Wound #5 Left,Anterior Lower Leg o Nurse Visit as needed o Other: - twice weekly clinic visit, return on friday for nurse visit [name], [name] ([id number]) Wound #6 Left Lower Leg o Return Appointment in 1 week. o Nurse Visit as needed o Other: - twice weekly clinic visit, return  on friday for nurse visit Edema Control Wound #2 Right,Distal,Lateral Lower Leg o 4 Layer Compression System - Bilateral - unna to anchor o Compression Pump: Use compression pump on left lower extremity for 30 minutes, twice daily. - one hour use o Compression Pump: Use compression pump on right lower extremity for 30 minutes, twice daily. - one hour use Wound #4 Right,Distal,Lateral Lower Leg o 4 Layer Compression System - Bilateral - unna to anchor o Compression Pump: Use compression pump on left lower extremity for 30 minutes, twice daily. - one hour use o Compression Pump: Use compression pump on right lower extremity for 30 minutes, twice daily. - one hour use Wound #5 Left,Anterior Lower Leg o 4 Layer Compression System - Bilateral - unna to anchor o Compression Pump: Use compression pump on left lower extremity for 30 minutes, twice daily. - one hour use o Compression Pump: Use compression pump on right lower extremity for 30 minutes, twice daily. - one hour use Wound #6 Left Lower Leg o 4 Layer Compression System - Bilateral - unna to anchor o Compression Pump: Use compression pump on left lower extremity for 30 minutes, twice daily. - one hour use o Compression Pump: Use compression pump on right lower extremity for 30 minutes, twice daily. - one hour use Additional Orders / Instructions o Vitamin A; Vitamin C, Zinc o Increase protein intake. Patient Medications Allergies: No Known Allergies Notifications Medication Indication Start End lidocaine DOSE 1 - topical 4 % cream - 1 cream topical Electronic  Signature(s) Signed: 04/23/2018 5:20:44 PM By: [name] Signed: 04/24/2018 8:38:06 AM By: Lenda Kelp PA-C Entered By: [name] on 04/23/2018 16:26:07 [name] ([id number]) -------------------------------------------------------------------------------- Prescription 04/23/2018 Patient Name: Jessica Casey R. Provider: Lenda Kelp PA-C Date of Birth: 09/04/1981 NPI#: [id number] Sex: F DEA#: [id number] Phone #: [phone] License #: Patient Address: [facility] [address] [facility] [place], [place] [address] [address], [address] [place], [place] [address] [phone] Allergies No Known Allergies Medication Medication: Route: Strength: Form: lidocaine topical 4% cream Class: TOPICAL LOCAL ANESTHETICS Dose: Frequency / Time: Indication: 1 1 cream topical Number of Refills: Number of Units: 0 Generic Substitution: Start Date: End Date: Administered at Substitution Permitted Facility: Yes Time Administered: Time Discontinued: Note to Pharmacy: Signature(s): Date(s): Electronic Signature(s) Signed: 04/23/2018 5:20:44 PM By: [name] Signed: 04/24/2018 8:38:06 AM By: Lenda Kelp PA-C Entered By: [name] on 04/23/2018 16:26:08 [name] ([id number]) Karilyn Cota, Mry R. ([id number]) --------------------------------------------------------------------------------  Problem List Details Patient Name: Jessica Casey R. Date of Service: 04/23/2018 3:30 PM Medical Record Number: [id number] Patient Account Number: [id number] Date of Birth/Sex: 24-Dec-1980 (37 y.o. F) Treating RN: Phillis Haggis Primary Care Provider: PATIENT, NO Other Clinician: Referring Provider: Referral, Self Treating Provider/Extender: STONE III, HOYT Weeks in Treatment: 32 Active Problems ICD-10 Impacting Encounter Code Description Active Date Wound Healing Diagnosis L97.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d 09/10/2017 No Yes L97.221 Non-pressure chronic ulcer of left calf limited to breakdown of 04/17/2018 No Yes skin I89.0 Lymphedema, not elsewhere classified 09/10/2017 No Yes I87.311 Chronic venous hypertension (idiopathic) with ulcer of right 09/10/2017 No Yes lower extremity E66.01 Morbid (severe) obesity due  to excess calories 09/10/2017 No Yes Inactive Problems Resolved Problems Electronic Signature(s) Signed: 04/24/2018 8:38:06 AM By: Lenda Kelp PA-C Entered By: Lenda Kelp on 04/23/2018 15:50:35 [name], [name] ([id number]) -------------------------------------------------------------------------------- Progress Note Details Patient Name: Jessica Casey R. Date of Service: 04/23/2018 3:30 PM Medical Record Number: [id number] Patient Account Number: [id number] Date of Birth/Sex: Apr 17, 1981 (37 y.o. F) Treating RN: Phillis Haggis Primary Care Provider: PATIENT, NO Other Clinician: Referring Provider: Referral, Self Treating Provider/Extender: STONE III, HOYT Weeks in Treatment: 32 Subjective Chief Complaint Information obtained from Patient She is here in follow up evaluation of right lower extremity ulcers History of Present Illness (HPI) 37 year old patient well known to our [facility] wound care clinic where she has been seen since 2016 for bilateral lower extremity venous insufficiency disease with lymphedema and multiple ulcerations associated with morbid obesity. she had custom-made compression stockings and lymphedema pumps which were used in the past. most recently she was admitted to the hospital between October 11 and 09/02/2017 with sepsis, lower extremity wounds and lymphedema.she was initially treated in the outpatient with Keflex and Bactrim. she was initially treated in the hospital with vancomycin and Zosyn and changed over to Unasyn until her white count improved and her blood cultures were negative for 3 days. After her inpatient management she was discharged home on Augmentin to end on 09/13/2017 with a 14 day course. she has had outpatient vascular duplex scans completed in November 2017 and her right ABI was 1.1 and the left ABI is 1.3. she had normal toe brachial indices bilaterally.she had three-vessel runoff in the right lower extremity and two-vessel runoff  in the left lower extremity. On questioning the patient she does have custom made compression stockings and also has a lymphedema pump but has not  been using it appropriately and has not been taking good care of herself. 09/17/2017 -- she returns today with compression stockings on the left side and the right side has had significant amount of drainage and has a very strong odor 09/24/2017 -- the drainage is increased significantly and she has more lymphedema and a very strong odor to her wound. Though she does not have systemic symptoms, or overt infection I believe she will benefit from some doxycycline given empirically. 10/01/2017 -- after starting the doxycycline and changing the dressing twice a week her symptoms and signs have definitely improved overall. 10/08/2017 -- she has completed her course of doxycycline but continues to have a lot of drainage and needs twice a week dressing changes. 11/08/17-she is here in follow-up evaluation for right lower extremity ulcers. She admits to using her lymphedema pumps twice daily, one hour per session. she is voicing no complaints or concerns, no signs of infection will change to [facility] 12/14/17 on evaluation today patient appears to be doing very well in regard to her wounds. She has been tolerating the dressing changes she continues to develop some portly the adherent granular tissue on the surface of the wound with some Slough. Obviously we are trying to get too much better wound bed. With that being said the hyper granulation the Hydrofera Blue Dressing to have helped with which is excellent news. However I think it may be time to try something a little bit different at this point. 01/11/18 on evaluation today patient appears to be doing fairly well in regard to her right lateral lower extremity ulcers. This shows excellent signs of filling in which is great news. There does not appear to be any evidence of infection which is also good news.  She does continue to work as well is good school. She is having no pain. 01/22/18 on evaluation today patient appears to be doing a little bit worse in my opinion in regard to the overall quality of that Stroschein, Glynn R. ([id number]) granulation on her right lower extremity. She was not here last week due to being sick with a stomach virus this may have something to do with the fact that her wound appears to be a little bit worse. With that being said I'm also thinking that after switching from the [facility] Dressing to the silver collagen would really has not looked that's good in my opinion. We may want to swit 02/11/18 on evaluation today patient's right lower/lateral lower extremity ulcers appear to be doing very well at this point. Especially the more proximal ulcer has filled in much closer to surface which is good news. Nonetheless both show signs of improvement which is great news. There does not appear to be any evidence of infection which is also good news. In general patient has been doing well tolerating the wraps as well as the [employer]. 02/18/18 on evaluation today patient appears to be doing a little bit worse in regard to the periwound region the wounds themselves do not look much deteriorated to me. With that being said she has several small blisters/pustules noted in the periwound and there was a significant amount of drainage and maceration compared to previous. There has been a time that we had to bring her back for twice a week dressing changes as far as her wrap was concerned it has been a while since we've done that however. With that being said the patient has been having some burning and in general I'm concerned  about the possibility of infection. She has previously taken doxycycline with good result. Fortunately there does not appear to be any evidence of overall worsening in regard to the size of the wound and in fact the upper wound actually appears to be  showing signs of good epithelialization. 03/18/18 on evaluation today patient appears to be doing excellent in regard to her right lateral lower extremity ulcer. She has been tolerating the dressing changes without complication. Fortunately this seems to be making great progress. Overall I see no signs of infection and there is dramatic improvement overall even compared to last week. 03/28/18 on evaluation today patient appears to be doing very well in regard to her right lateral lower surety ulcer. She has been tolerating the dressing changes without complication at this point. She states currently that she's having no significant discomfort which is excellent as well. Overall I'm pleased with how things seem to be progressing. 04/01/18 on evaluation today patient appears to be doing excellent in regard to her right lateral lower extremity ulcers. She has been tolerating the dressing changes without complication which is good news. With that being said the wraps still continues to show signs of helping with her fluid she does have Juxta-Lite compression stockings for when we are done with the current treatment regimen once everything heals. Mainly she just has the one area still remaining the smaller of the two wings is pretty much closed at this point there's just a very slight opening noted. 04/08/18 on evaluation today patient appears to be doing better in regard to the original wound on the right lateral lower extremity that we have been managing. Unfortunately she has a new area of weeping more anterior on the right lateral lower extremity and on the left lower extremity she has two new ulcers there appears to be some cellulitis noted at this time. I am concerned about the fact that this may in fact be an infection that has caused the worsening and swelling in the past this has been the case when we previously attempted to determine what was going on when she had down slides like this. With  that being said the patient is seeming to tolerate the wraps fairly well for the most part. 04/17/18; since last time the patient was seen in this clinic she was hospitalized from 04/11/18 through 04/13/18; she presented with bilateral lower extremity pain worse on the right and a fever of up to 104. Noteworthy that when she was in the clinic last week she had new wounds on the left leg culture grew MRSA and she was prescribed Bactrim. She had 2 days of IV Vanco and Zosyn in the hospital. Her blood cultures were negative. She was discharged on Bactrim to cover the original MRSA on the right leg and Keflex to cover the possibility of strep. The hospitalist had a conversation with infectious disease. The patient arrives in clinic today for nurse check however given the recent hospitalization I was asked to look at her. The patient states she feels a lot better. No fever or chills. Still some pain in the right calf but a lot better. She arrived in the hospital with a white count of 15.6, the next day was 10.8. Comprehensive metabolic panel was normal. She is still taking Keflex and Bactrim It would appear that she had a surgical IandD at the bedside of the right calf felt to have a underlying abscess. According to our intake nurse the wound has expanded quite bit on the  right lateral calf. She has no open area on the left anterior and left posterior calf as described last time 04/23/18 on evaluation today patient actually appears to be doing much better than when I last saw her. This obviously has been a couple weeks ago and in the interim she was admitted to the hospital for IV antibiotic therapy due to cellulitis, discharge, and fortunately the substance which hadn't sued has completely resolved. Her swelling seems to be better in regard to her lower extremities as well and the wounds that opened up as results of the infection seems to be showing signs of improvement. There is some Slough noted on the  left lateral wound otherwise a lot of weeping noted of the right lateral leg. KIDA, DIGIULIO ([id number]) Patient History Information obtained from Patient. Family History Kidney Disease - Mother, No family history of Cancer, Diabetes, Heart Disease, Hereditary Spherocytosis, Hypertension, Lung Disease, Seizures, Stroke, Thyroid Problems, Tuberculosis. Social History Never smoker, Marital Status - Married, Alcohol Use - Never, Drug Use - No History, Caffeine Use - Daily. Review of Systems (ROS) Constitutional Symptoms (General Health) Denies complaints or symptoms of Fever, Chills. Respiratory The patient has no complaints or symptoms. Cardiovascular Complains or has symptoms of LE edema. Psychiatric The patient has no complaints or symptoms. Objective Constitutional Obese and well-hydrated in no acute distress. Vitals Time Taken: 3:45 PM, Height: 74 in, Weight: 486 lbs, BMI: 62.4, Temperature: 97.9 F, Pulse: 95 bpm, Respiratory Rate: 20 breaths/min, Blood Pressure: 164/88 mmHg. Respiratory normal breathing without difficulty. Psychiatric this patient is able to make decisions and demonstrates good insight into disease process. Alert and Oriented x 3. pleasant and cooperative. General Notes: Patient's wound on the right does not show much in the way of significant slough which is good news. There does however appear to be a lot of drainage and weeping noted currently. I do believe that the patient is tolerating the wraps very well and overall I think that we may want to switch back to [facility] Dressing that she's done even better with this in the past. We tried other dressings but she just tends to always come back to this being the best. Subsequently in regard to left lower extremity this did require sharp debridement today which was performed without complication and with minimal discomfort for the patient. She tolerated this in an excellent fashion and post debridement  the wound bed appears to be doing much better. Integumentary (Hair, Skin) Wound #2 status is Open. Original cause of wound was Gradually Appeared. The wound is located on the Right,Distal,Lateral Lower Leg. The wound measures 5.5cm length x 5cm width x 0.1cm depth; 21.598cm^2 area and 2.16cm^3 volume. [name], [name] ([id number]) Wound #4 status is Open. Original cause of wound was Gradually Appeared. The wound is located on the Right,Distal,Lateral Lower Leg. The wound measures 0.2cm length x 0.2cm width x 0.1cm depth; 0.031cm^2 area and 0.003cm^3 volume. Wound #5 status is Open. Original cause of wound was Gradually Appeared. The wound is located on the Left,Anterior Lower Leg. The wound measures 2.2cm length x 2.7cm width x 0.1cm depth; 4.665cm^2 area and 0.467cm^3 volume. Wound #6 status is Open. Original cause of wound was Gradually Appeared. The wound is located on the Left Lower Leg. The wound measures 0.3cm length x 0.3cm width x 0.1cm depth; 0.071cm^2 area and 0.007cm^3 volume. 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 There was a Excisional Skin/Subcutaneous Tissue Debridement with a total area of 5.94 sq cm performed by STONE III, HOYT E., PA-C. With the following instrument(s): Curette to remove Viable and Non-Viable tissue/material. Material removed includes Subcutaneous Tissue, Slough, and Fibrin/Exudate after achieving pain control using Lidocaine 4% Topical Solution. No specimens were taken. A time out was conducted at 16:21, prior to the start of the procedure. A Minimum amount of  bleeding was controlled with Pressure. The procedure was tolerated well with a pain level of 0 throughout and a pain level of 0 following the procedure. Patient s Level of Consciousness post procedure was recorded as Awake and Alert. Post Debridement Measurements: 2.2cm length x 2.7cm width x 0.2cm depth; 0.933cm^3 volume. Character of Wound/Ulcer Post Debridement requires further debridement. Post procedure Diagnosis Wound #5: Same as Pre-Procedure Plan Wound Cleansing: Wound #2 Right,Distal,Lateral Lower Leg: Clean wound with Normal Saline. Cleanse wound with mild soap and water Wound #4 Right,Distal,Lateral Lower Leg: Clean wound with Normal Saline. Cleanse wound with mild soap and water Wound #5 Left,Anterior Lower Leg: [name], [name] ([id number]) Clean wound with Normal Saline. Cleanse wound with mild soap and water Wound #6 Left Lower Leg: Clean wound with Normal Saline. Cleanse wound with mild soap and water Anesthetic (add to Medication List): Wound #2 Right,Distal,Lateral Lower Leg: Topical Lidocaine 4% cream applied to wound bed prior to debridement (In Clinic Only). Wound #4 Right,Distal,Lateral Lower Leg: Topical Lidocaine 4% cream applied to wound bed prior to debridement (In Clinic Only). Wound #5 Left,Anterior Lower Leg: Topical Lidocaine 4% cream applied to wound bed prior to debridement (In Clinic Only). Wound #6 Left Lower Leg: Topical Lidocaine 4% cream applied to wound bed prior to debridement (In Clinic Only). Skin Barriers/Peri-Wound Care: Moisturizing lotion Primary Wound Dressing: Wound #2 Right,Distal,Lateral Lower Leg: Hydrafera Blue Ready Transfer Wound #4 Right,Distal,Lateral Lower Leg: Hydrafera Blue Ready Transfer Wound #5 Left,Anterior Lower Leg: Hydrafera Blue Ready Transfer Wound #6 Left Lower Leg: Hydrafera Blue Ready Transfer Secondary Dressing: Wound #2 Right,Distal,Lateral Lower Leg: ABD pad XtraSorb Wound #4 Right,Distal,Lateral Lower  Leg: ABD pad XtraSorb Wound #5 Left,Anterior Lower Leg: ABD pad XtraSorb Wound #6 Left Lower Leg: ABD pad XtraSorb Dressing Change Frequency: Wound #2 Right,Distal,Lateral Lower Leg: Other: - twice weekly, return for nurse visit friday Wound #4 Right,Distal,Lateral Lower Leg: Other: - twice weekly, return for nurse visit friday Wound #5 Left,Anterior Lower Leg: Other: - twice weekly, return for nurse visit friday Wound #6 Left Lower Leg: Other: - twice weekly, return for nurse visit friday Follow-up Appointments: Wound #2 Right,Distal,Lateral Lower Leg: Return Appointment in 1 week. Nurse Visit as needed Other: - twice weekly clinic visit, return on friday for nurse visit Wound #4 Right,Distal,Lateral Lower Leg: Return Appointment in 1 week. Nurse Visit as needed Other: - twice weekly clinic visit, return on friday for nurse visit Wound #5 Left,Anterior Lower Leg: Nurse Visit as needed Other: - twice weekly clinic visit, return on friday for nurse visit Wound #6 Left Lower Leg: [name], [name] ([id number]) Return Appointment in 1 week. Nurse Visit as needed Other: - twice weekly clinic visit, return on friday for nurse visit Edema Control: Wound #2 Right,Distal,Lateral Lower Leg: 4 Layer Compression System - Bilateral - unna to anchor Compression Pump: Use compression  pump on left lower extremity for 30 minutes, twice daily. - one hour use Compression Pump: Use compression pump on right lower extremity for 30 minutes, twice daily. - one hour use Wound #4 Right,Distal,Lateral Lower Leg: 4 Layer Compression System - Bilateral - unna to anchor Compression Pump: Use compression pump on left lower extremity for 30 minutes, twice daily. - one hour use Compression Pump: Use compression pump on right lower extremity for 30 minutes, twice daily. - one hour use Wound #5 Left,Anterior Lower Leg: 4 Layer Compression System - Bilateral - unna to anchor Compression Pump: Use  compression pump on left lower extremity for 30 minutes, twice daily. - one hour use Compression Pump: Use compression pump on right lower extremity for 30 minutes, twice daily. - one hour use Wound #6 Left Lower Leg: 4 Layer Compression System - Bilateral - unna to anchor Compression Pump: Use compression pump on left lower extremity for 30 minutes, twice daily. - one hour use Compression Pump: Use compression pump on right lower extremity for 30 minutes, twice daily. - one hour use Additional Orders / Instructions: Vitamin A; Vitamin C, Zinc Increase protein intake. The following medication(s) was prescribed: lidocaine topical 4 % cream 1 1 cream topical was prescribed at facility I'm gonna suggest currently that we go ahead and put [place] to switch back to the [facility] Dressing at all locations. We will continue with the compression wraps since he seems to be doing so well with these. The patient is in agreement the plan. Otherwise I'm hopeful she will continue to make good progress. Please see above for specific wound care orders. We will see patient for re-evaluation in 1 week(s) here in the clinic. If anything worsens or changes patient will contact our office for additional recommendations. Electronic Signature(s) Signed: 04/24/2018 8:38:06 AM By: Lenda Kelp PA-C Entered By: Lenda Kelp on 04/24/2018 08:18:43 [name] ([id number]) -------------------------------------------------------------------------------- ROS/PFSH Details Patient Name: Jessica Casey R. Date of Service: 04/23/2018 3:30 PM Medical Record Number: [id number] Patient Account Number: [id number] Date of Birth/Sex: 11-Apr-1981 (37 y.o. F) Treating RN: Phillis Haggis Primary Care Provider: PATIENT, NO Other Clinician: Referring Provider: Referral, Self Treating Provider/Extender: STONE III, HOYT Weeks in Treatment: 32 Information Obtained From Patient Wound History Do you currently have one or more open  woundso Yes How many open wounds do you currently haveo 1 Approximately how long have you had your woundso 1 month How have you been treating your wound(s) until nowo aquacel ag Has your wound(s) ever healed and then re-openedo No Have you had any lab work done in the past montho No Have you tested positive for an antibiotic resistant organism (MRSA, VRE)o No Have you tested positive for osteomyelitis (bone infection)o No Have you had any tests for circulation on your legso Yes Who ordered the testo [facility] Where was the test doneo [facility] Constitutional Symptoms (General Health) Complaints and Symptoms: Negative for: Fever; Chills Cardiovascular Complaints and Symptoms: Positive for: LE edema Medical History: Positive for: Hypertension Negative for: Angina; Arrhythmia; Congestive Heart Failure; Coronary Artery Disease; Deep Vein Thrombosis; Hypotension; Myocardial Infarction; Peripheral Arterial Disease; Peripheral Venous Disease; Phlebitis; Vasculitis Hematologic/Lymphatic Medical History: Positive for: Lymphedema Respiratory Complaints and Symptoms: No Complaints or Symptoms Medical History: Negative for: Aspiration; Asthma; Chronic Obstructive Pulmonary Disease (COPD); Pneumothorax; Sleep Apnea; Tuberculosis Psychiatric Complaints and Symptoms: No Complaints or Symptoms [name], [name] ([id number]) Immunizations Pneumococcal Vaccine: Received Pneumococcal Vaccination: No Immunization Notes: up to date Implantable Devices Family and Social  History Cancer: No; Diabetes: No; Heart Disease: No; Hereditary Spherocytosis: No; Hypertension: No; Kidney Disease: Yes - Mother; Lung Disease: No; Seizures: No; Stroke: No; Thyroid Problems: No; Tuberculosis: No; Never smoker; Marital Status - Married; Alcohol Use: Never; Drug Use: No History; Caffeine Use: Daily; Financial Concerns: No; Food, Clothing or Shelter Needs: No; Support System Lacking: No; Transportation Concerns: No; Advanced  Directives: No; Patient does not want information on Advanced Directives Physician Affirmation I have reviewed and agree with the above information. Electronic Signature(s) Signed: 04/24/2018 8:38:06 AM By: Lenda Kelp PA-C Signed: 04/25/2018 2:43:37 PM By: [name] Entered By: Lenda Kelp on 04/24/2018 08:18:09 [name], [name] ([id number]) -------------------------------------------------------------------------------- SuperBill Details Patient Name: Jessica Casey R. Date of Service: 04/23/2018 Medical Record Number: [id number] Patient Account Number: [id number] Date of Birth/Sex: [birth year] (37 y.o. F) Treating RN: Phillis Haggis Primary Care Provider: PATIENT, NO Other Clinician: Referring Provider: Referral, Self Treating Provider/Extender: STONE III, HOYT Weeks in Treatment: 32 Diagnosis Coding ICD-10 Codes Code Description [phone] 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d L97.221 Non-pressure chronic ulcer of left calf limited to breakdown of skin I89.0 Lymphedema, not elsewhere classified I87.311 Chronic venous hypertension (idiopathic) with ulcer of right lower extremity E66.01 Morbid (severe) obesity due to excess calories Facility Procedures CPT4 Code: [id number] Description: 11042 - DEB SUBQ TISSUE 20 SQ CM/< ICD-10 Diagnosis Description L97.221 Non-pressure chronic ulcer of left calf limited to breakdown Modifier: of skin Quantity: 1 Physician Procedures CPT4 Code: [id number] Description: 11042 - WC PHYS SUBQ TISS 20 SQ CM ICD-10 Diagnosis Description L97.221 Non-pressure chronic ulcer of left calf limited to breakdown Modifier: of skin Quantity: 1 Electronic Signature(s) Signed: 04/24/2018 8:38:06 AM By: Lenda Kelp PA-C Entered By: Lenda Kelp on 04/24/2018 08:18:59

## 2018-04-29 ENCOUNTER — Encounter: Payer: Self-pay | Admitting: Physician Assistant

## 2018-04-29 NOTE — Progress Notes (Signed)
[name], Betha R. ([id number]) Visit Report for 04/26/2018 Arrival Information Details Patient Name: [name], [name] Date of Service: 04/26/2018 3:45 PM Medical Record Number: [id number] Patient Account Number: [id number] Date of Birth/Sex: Jun 09, 1981 (37 y.o. F) Treating RN: Renne CriglerFlinchum, Cheryl Primary Care Kindsey Eblin: PATIENT, NO Other Clinician: Referring Shagun Wordell: Referral, Self Treating Jamill Wetmore/Extender: Kathreen Cosieroulter, Leah Weeks in Treatment: 32 Visit Information History Since Last Visit All ordered tests and consults were completed: No Patient Arrived: Ambulatory Added or deleted any medications: No Arrival Time: 16:17 Any new allergies or adverse reactions: No Accompanied By: self Had a fall or experienced change in No Transfer Assistance: None activities of daily living that may affect Patient Identification Verified: Yes risk of falls: Secondary Verification Process Completed: Yes Signs or symptoms of abuse/neglect since last visito No Patient Requires Transmission-Based No Hospitalized since last visit: No Precautions: Implantable device outside of the clinic excluding No Patient Has Alerts: No cellular tissue based products placed in the center since last visit: Pain Present Now: No Electronic Signature(s) Signed: 04/26/2018 4:27:05 PM By: Renne CriglerFlinchum, Cheryl Entered By: Renne CriglerFlinchum, Cheryl on 04/26/2018 16:17:58 Avera, Davonna R. ([id number]) -------------------------------------------------------------------------------- Clinic Level of Care Assessment Details Patient Name: Jessica CaseyLEATH, [name] Date of Service: 04/26/2018 3:45 PM Medical Record Number: [id number] Patient Account Number: [id number] Date of Birth/Sex: Jun 09, 1981 (37 y.o. F) Treating RN: Renne CriglerFlinchum, Cheryl Primary Care Malene Blaydes: PATIENT, NO Other Clinician: Referring Jahmeek Shirk: Referral, Self Treating Lena Gores/Extender: Kathreen Cosieroulter, Leah Weeks in Treatment: 32 Clinic Level of Care Assessment Items TOOL 4 Quantity Score X - Use when  only an EandM is performed on FOLLOW-UP visit 1 0 ASSESSMENTS - Nursing Assessment / Reassessment X - Reassessment of Co-morbidities (includes updates in patient status) 1 10 X- 1 5 Reassessment of Adherence to Treatment Plan ASSESSMENTS - Wound and Skin Assessment / Reassessment []  - Simple Wound Assessment / Reassessment - one wound 0 X- 4 5 Complex Wound Assessment / Reassessment - multiple wounds []  - 0 Dermatologic / Skin Assessment (not related to wound area) ASSESSMENTS - Focused Assessment []  - Circumferential Edema Measurements - multi extremities 0 []  - 0 Nutritional Assessment / Counseling / Intervention []  - 0 Lower Extremity Assessment (monofilament, tuning fork, pulses) []  - 0 Peripheral Arterial Disease Assessment (using hand held doppler) ASSESSMENTS - Ostomy and/or Continence Assessment and Care []  - Incontinence Assessment and Management 0 []  - 0 Ostomy Care Assessment and Management (repouching, etc.) PROCESS - Coordination of Care []  - Simple Patient / Family Education for ongoing care 0 X- 1 20 Complex (extensive) Patient / Family Education for ongoing care []  - 0 Staff obtains ChiropractorConsents, Records, Test Results / Process Orders []  - 0 Staff telephones HHA, Nursing Homes / Clarify orders / etc []  - 0 Routine Transfer to another Facility (non-emergent condition) []  - 0 Routine Hospital Admission (non-emergent condition) []  - 0 New Admissions / Manufacturing engineernsurance Authorizations / Ordering NPWT, Apligraf, etc. []  - 0 Emergency Hospital Admission (emergent condition) []  - 0 Simple Discharge Coordination Ferrero, Aloise R. ([id number]) X- 1 15 Complex (extensive) Discharge Coordination PROCESS - Special Needs []  - Pediatric / Minor Patient Management 0 []  - 0 Isolation Patient Management []  - 0 Hearing / Language / Visual special needs []  - 0 Assessment of Community assistance (transportation, D/C planning, etc.) []  - 0 Additional assistance / Altered  mentation []  - 0 Support Surface(s) Assessment (bed, cushion, seat, etc.) INTERVENTIONS - Wound Cleansing / Measurement []  - Simple Wound Cleansing - one wound 0 X- 4 5 Complex Wound Cleansing - multiple  wounds X- 1 5 Wound Imaging (photographs - any number of wounds) []  - 0 Wound Tracing (instead of photographs) []  - 0 Simple Wound Measurement - one wound X- 4 5 Complex Wound Measurement - multiple wounds INTERVENTIONS - Wound Dressings []  - Small Wound Dressing one or multiple wounds 0 X- 4 15 Medium Wound Dressing one or multiple wounds []  - 0 Large Wound Dressing one or multiple wounds []  - 0 Application of Medications - topical []  - 0 Application of Medications - injection INTERVENTIONS - Miscellaneous []  - External ear exam 0 []  - 0 Specimen Collection (cultures, biopsies, blood, body fluids, etc.) []  - 0 Specimen(s) / Culture(s) sent or taken to Lab for analysis []  - 0 Patient Transfer (multiple staff / Nurse, adult / Similar devices) []  - 0 Simple Staple / Suture removal (25 or less) []  - 0 Complex Staple / Suture removal (26 or more) []  - 0 Hypo / Hyperglycemic Management (close monitor of Blood Glucose) []  - 0 Ankle / Brachial Index (ABI) - do not check if billed separately X- 1 5 Vital Signs Hollerbach, Julyanna R. ([id number]) Has the patient been seen at the hospital within the last three years: Yes Total Score: 180 Level Of Care: New/Established - Level 5 Electronic Signature(s) Signed: 04/26/2018 4:27:05 PM By: Renne Crigler Entered By: Renne Crigler on 04/26/2018 16:25:41 Patchen, [name] ([id number]) -------------------------------------------------------------------------------- Compression Therapy Details Patient Name: Jessica Casey R. Date of Service: 04/26/2018 3:45 PM Medical Record Number: [id number] Patient Account Number: [id number] Date of Birth/Sex: [birth year] (37 y.o. F) Treating RN: Renne Crigler Primary Care Eliese Kerwood: PATIENT, NO Other  Clinician: Referring Muadh Creasy: Referral, Self Treating Denym Christenberry/Extender: Kathreen Cosier in Treatment: 32 Compression Therapy Performed for Wound Assessment: Wound #2 Right,Proximal,Lateral Lower Leg Performed By: Clinician Renne Crigler, RN Compression Type: Four Layer Pre Treatment ABI: 1.6 Electronic Signature(s) Signed: 04/26/2018 4:27:05 PM By: Renne Crigler Entered By: Renne Crigler on 04/26/2018 16:24:46 Adamski, Antionetta R. ([id number]) -------------------------------------------------------------------------------- Compression Therapy Details Patient Name: Jessica Casey R. Date of Service: 04/26/2018 3:45 PM Medical Record Number: [id number] Patient Account Number: [id number] Date of Birth/Sex: [birth year] (37 y.o. F) Treating RN: Renne Crigler Primary Care Alfreida Steffenhagen: PATIENT, NO Other Clinician: Referring Chaselyn Nanney: Referral, Self Treating Chadwin Fury/Extender: Kathreen Cosier in Treatment: 32 Compression Therapy Performed for Wound Assessment: Wound #4 Right,Distal,Lateral Lower Leg Performed By: Clinician Renne Crigler, RN Compression Type: Four Layer Pre Treatment ABI: 1.6 Electronic Signature(s) Signed: 04/26/2018 4:27:05 PM By: Renne Crigler Entered By: Renne Crigler on 04/26/2018 16:24:46 Deacon, Kynzleigh R. ([id number]) -------------------------------------------------------------------------------- Compression Therapy Details Patient Name: Jessica Casey R. Date of Service: 04/26/2018 3:45 PM Medical Record Number: [id number] Patient Account Number: [id number] Date of Birth/Sex: [birth year] (37 y.o. F) Treating RN: Renne Crigler Primary Care Byran Bilotti: PATIENT, NO Other Clinician: Referring Serrita Lueth: Referral, Self Treating Oshay Stranahan/Extender: Kathreen Cosier in Treatment: 32 Compression Therapy Performed for Wound Assessment: Wound #5 Left,Anterior Lower Leg Performed By: Clinician Renne Crigler, RN Compression Type: Four Layer Pre Treatment ABI:  1.6 Electronic Signature(s) Signed: 04/26/2018 4:27:05 PM By: Renne Crigler Entered By: Renne Crigler on 04/26/2018 16:24:46 Woodlief, Jaelene R. ([id number]) -------------------------------------------------------------------------------- Compression Therapy Details Patient Name: Jessica Casey R. Date of Service: 04/26/2018 3:45 PM Medical Record Number: [id number] Patient Account Number: [id number] Date of Birth/Sex: [birth year] (37 y.o. F) Treating RN: Renne Crigler Primary Care Mylz Yuan: PATIENT, NO Other Clinician: Referring Darnell Stimson: Referral, Self Treating Kista Robb/Extender: Kathreen Cosier in Treatment: 32 Compression Therapy Performed for Wound Assessment: Wound #6 Left,Posterior Lower Leg Performed By: Clinician Flinchum,  Elnita Maxwell, RN Compression Type: Four Layer Pre Treatment ABI: 1.6 Electronic Signature(s) Signed: 04/26/2018 4:27:05 PM By: Renne Crigler Entered By: Renne Crigler on 04/26/2018 16:24:46 Hammerstrom, Raphael RMarland Kitchen ([id number]) -------------------------------------------------------------------------------- Encounter Discharge Information Details Patient Name: Jessica Casey R. Date of Service: 04/26/2018 3:45 PM Medical Record Number: [id number] Patient Account Number: [id number] Date of Birth/Sex: 1980-12-31 (37 y.o. F) Treating RN: Renne Crigler Primary Care Sachin Ferencz: PATIENT, NO Other Clinician: Referring Truth Barot: Referral, Self Treating Kelly Eisler/Extender: Kathreen Cosier in Treatment: 32 Encounter Discharge Information Items Discharge Condition: Stable Ambulatory Status: Ambulatory Discharge Destination: Home Transportation: Private Auto Schedule Follow-up Appointment: Yes Clinical Summary of Care: Electronic Signature(s) Signed: 04/26/2018 4:27:05 PM By: Renne Crigler Entered By: Renne Crigler on 04/26/2018 16:25:04 [name] ([id number]) -------------------------------------------------------------------------------- Patient/Caregiver  Education Details Patient Name: [name] Date of Service: 04/26/2018 3:45 PM Medical Record Number: [id number] Patient Account Number: [id number] Date of Birth/Gender: Jul 22, 1981 (36 y.o. F) Treating RN: Renne Crigler Primary Care Physician: PATIENT, NO Other Clinician: Referring Physician: Referral, Self Treating Physician/Extender: Kathreen Cosier in Treatment: 59 Education Assessment Education Provided To: Patient Education Topics Provided Wound/Skin Impairment: Handouts: Caring for Your Ulcer Methods: Explain/Verbal Responses: State content correctly Electronic Signature(s) Signed: 04/26/2018 4:27:05 PM By: Renne Crigler Entered By: Renne Crigler on 04/26/2018 16:20:25 Boback, Cristal Deer ([id number]) -------------------------------------------------------------------------------- Wound Assessment Details Patient Name: Jessica Casey R. Date of Service: 04/26/2018 3:45 PM Medical Record Number: [id number] Patient Account Number: [id number] Date of Birth/Sex: 05-19-1981 (37 y.o. F) Treating RN: Renne Crigler Primary Care Meghin Thivierge: PATIENT, NO Other Clinician: Referring Maidie Streight: Referral, Self Treating Kassiah Mccrory/Extender: Kathreen Cosier in Treatment: 32 Wound Status Wound Number: 2 Primary Etiology: Lymphedema Wound Location: Right Lower Leg - Lateral, Proximal Wound Status: Open Wounding Event: Gradually Appeared Comorbid History: Lymphedema, Hypertension Date Acquired: 08/08/2017 Weeks Of Treatment: 32 Clustered Wound: Yes Photos Photo Uploaded By: Renne Crigler on 04/26/2018 16:28:18 Wound Measurements Length: (cm) 5 Width: (cm) 5 Depth: (cm) 0.1 Area: (cm) 19.635 Volume: (cm) 1.963 % Reduction in Area: 8.4% % Reduction in Volume: 69.5% Epithelialization: Large (67-100%) Tunneling: No Undermining: No Wound Description Full Thickness With Exposed Support Classification: Structures Exudate Large Amount: Exudate Type: Serous Exudate  Color: amber Foul Odor After Cleansing: No Slough/Fibrino Yes Wound Bed Granulation Amount: Large (67-100%) Exposed Structure Granulation Quality: Red Fascia Exposed: No Necrotic Amount: Small (1-33%) Fat Layer (Subcutaneous Tissue) Exposed: Yes Necrotic Quality: Adherent Slough Tendon Exposed: No Muscle Exposed: No Joint Exposed: No Bone Exposed: No Periwound Skin Texture Bailon, [place] R. ([id number]) Texture Color No Abnormalities Noted: No No Abnormalities Noted: No Callus: No Atrophie Blanche: No Crepitus: No Cyanosis: No Excoriation: No Ecchymosis: No Induration: No Erythema: No Rash: No Hemosiderin Staining: No Scarring: No Mottled: No Pallor: No Moisture Rubor: No No Abnormalities Noted: No Dry / Scaly: No Maceration: No Wound Preparation Ulcer Cleansing: Rinsed/Irrigated with Saline Electronic Signature(s) Signed: 04/26/2018 4:27:05 PM By: Renne Crigler Entered By: Renne Crigler on 04/26/2018 16:21:58 Selley, Tasia R. ([id number]) -------------------------------------------------------------------------------- Wound Assessment Details Patient Name: Jessica Casey R. Date of Service: 04/26/2018 3:45 PM Medical Record Number: [id number] Patient Account Number: [id number] Date of Birth/Sex: [birth year] (37 y.o. F) Treating RN: Renne Crigler Primary Care Rochelle Nephew: PATIENT, NO Other Clinician: Referring Kaylyn Garrow: Referral, Self Treating Emmanuell Kantz/Extender: Kathreen Cosier in Treatment: 32 Wound Status Wound Number: 4 Primary Etiology: Lymphedema Wound Location: Right Lower Leg - Lateral, Distal Wound Status: Open Wounding Event: Gradually Appeared Comorbid History: Lymphedema, Hypertension Date Acquired: 04/07/2018 Weeks Of Treatment: 2 Clustered Wound: No Photos Photo  Uploaded By: Renne Crigler on 04/26/2018 16:28:18 Wound Measurements Length: (cm) 0.2 Width: (cm) 0.2 Depth: (cm) 0.1 Area: (cm) 0.031 Volume: (cm) 0.003 % Reduction in  Area: 99.7% % Reduction in Volume: 99.7% Epithelialization: Medium (34-66%) Tunneling: No Undermining: No Wound Description Classification: Partial Thickness Wound Margin: Flat and Intact Exudate Amount: Large Exudate Type: Serous Exudate Color: amber Foul Odor After Cleansing: No Slough/Fibrino Yes Wound Bed Granulation Amount: Large (67-100%) Exposed Structure Granulation Quality: Red Fascia Exposed: No Necrotic Amount: Small (1-33%) Fat Layer (Subcutaneous Tissue) Exposed: Yes Necrotic Quality: Adherent Slough Tendon Exposed: No Muscle Exposed: No Joint Exposed: No Bone Exposed: No Periwound Skin Texture Guidone, Kennis R. ([id number]) Texture Color No Abnormalities Noted: No No Abnormalities Noted: No Callus: No Atrophie Blanche: No Crepitus: No Cyanosis: No Excoriation: No Ecchymosis: No Induration: No Erythema: No Rash: No Hemosiderin Staining: No Scarring: No Mottled: No Pallor: No Moisture Rubor: No No Abnormalities Noted: No Dry / Scaly: No Temperature / Pain Maceration: No Temperature: No Abnormality Wound Preparation Ulcer Cleansing: Rinsed/Irrigated with Saline Electronic Signature(s) Signed: 04/26/2018 4:27:05 PM By: Renne Crigler Entered By: Renne Crigler on 04/26/2018 16:22:44 Boucher, Miyo R. ([id number]) -------------------------------------------------------------------------------- Wound Assessment Details Patient Name: Jessica Casey R. Date of Service: 04/26/2018 3:45 PM Medical Record Number: [id number] Patient Account Number: [id number] Date of Birth/Sex: 02/01/1981 (37 y.o. F) Treating RN: Renne Crigler Primary Care Brixon Zhen: PATIENT, NO Other Clinician: Referring Kimmberly Wisser: Referral, Self Treating Gavyn Zoss/Extender: Kathreen Cosier in Treatment: 32 Wound Status Wound Number: 5 Primary Etiology: Lymphedema Wound Location: Left Lower Leg - Anterior Wound Status: Open Wounding Event: Gradually Appeared Comorbid History:  Lymphedema, Hypertension Date Acquired: 04/07/2018 Weeks Of Treatment: 2 Clustered Wound: No Photos Photo Uploaded By: Renne Crigler on 04/26/2018 16:29:00 Wound Measurements Length: (cm) 2 Width: (cm) 2.5 Depth: (cm) 0.1 Area: (cm) 3.927 Volume: (cm) 0.393 % Reduction in Area: 49% % Reduction in Volume: 89.8% Epithelialization: Small (1-33%) Tunneling: No Undermining: No Wound Description Full Thickness Without Exposed Support Classification: Structures Wound Margin: Distinct, outline attached Exudate Large Amount: Exudate Type: Serous Exudate Color: amber Foul Odor After Cleansing: No Slough/Fibrino Yes Wound Bed Granulation Amount: Large (67-100%) Exposed Structure Granulation Quality: Red Fascia Exposed: No Necrotic Amount: Small (1-33%) Fat Layer (Subcutaneous Tissue) Exposed: Yes Necrotic Quality: Adherent Slough Tendon Exposed: No Muscle Exposed: No Joint Exposed: No Bone Exposed: No Stamey, Kaleya R. ([id number]) Periwound Skin Texture Texture Color No Abnormalities Noted: No No Abnormalities Noted: No Callus: No Atrophie Blanche: No Crepitus: No Cyanosis: No Excoriation: No Ecchymosis: No Induration: No Erythema: No Rash: No Hemosiderin Staining: No Scarring: No Mottled: No Pallor: No Moisture Rubor: No No Abnormalities Noted: No Dry / Scaly: No Temperature / Pain Maceration: No Temperature: No Abnormality Wound Preparation Ulcer Cleansing: Rinsed/Irrigated with Saline Electronic Signature(s) Signed: 04/26/2018 4:27:05 PM By: Renne Crigler Entered By: Renne Crigler on 04/26/2018 16:23:27 Haymer, Calayah R. ([id number]) -------------------------------------------------------------------------------- Wound Assessment Details Patient Name: Jessica Casey R. Date of Service: 04/26/2018 3:45 PM Medical Record Number: [id number] Patient Account Number: [id number] Date of Birth/Sex: Jun 24, 1981 (37 y.o. F) Treating RN: Renne Crigler Primary Care Javeria Briski: PATIENT, NO Other Clinician: Referring Berdena Cisek: Referral, Self Treating Itzabella Sorrels/Extender: Kathreen Cosier in Treatment: 32 Wound Status Wound Number: 6 Primary Etiology: Lymphedema Wound Location: Left Lower Leg - Posterior Wound Status: Open Wounding Event: Gradually Appeared Comorbid History: Lymphedema, Hypertension Date Acquired: 03/31/2018 Weeks Of Treatment: 2 Clustered Wound: No Photos Photo Uploaded By: Renne Crigler on 04/26/2018 16:29:01 Wound Measurements Length: (cm) 0.3 Width: (cm) 0.3  Depth: (cm) 0.1 Area: (cm) 0.071 Volume: (cm) 0.007 % Reduction in Area: 93.2% % Reduction in Volume: 96.6% Epithelialization: Medium (34-66%) Tunneling: No Undermining: No Wound Description Full Thickness Without Exposed Support Classification: Structures Wound Margin: Distinct, outline attached Exudate Large Amount: Exudate Type: Serous Exudate Color: amber Foul Odor After Cleansing: No Slough/Fibrino Yes Wound Bed Granulation Amount: Medium (34-66%) Exposed Structure Granulation Quality: Red Fascia Exposed: No Necrotic Amount: Medium (34-66%) Fat Layer (Subcutaneous Tissue) Exposed: Yes Necrotic Quality: Adherent Slough Tendon Exposed: No Muscle Exposed: No Joint Exposed: No Bone Exposed: No Norling, Mitzie R. ([id number]) Periwound Skin Texture Texture Color No Abnormalities Noted: No No Abnormalities Noted: No Callus: No Atrophie Blanche: No Crepitus: No Cyanosis: No Excoriation: No Ecchymosis: No Induration: No Erythema: No Rash: No Hemosiderin Staining: No Scarring: No Mottled: No Pallor: No Moisture Rubor: No No Abnormalities Noted: No Dry / Scaly: No Maceration: No Wound Preparation Ulcer Cleansing: Rinsed/Irrigated with Saline Electronic Signature(s) Signed: 04/26/2018 4:27:05 PM By: Renne Crigler Entered By: Renne Crigler on 04/26/2018 16:24:16

## 2018-05-02 ENCOUNTER — Ambulatory Visit: Payer: Self-pay

## 2018-05-02 ENCOUNTER — Other Ambulatory Visit: Payer: Self-pay | Admitting: Physician Assistant

## 2018-05-02 DIAGNOSIS — L97221 Non-pressure chronic ulcer of left calf limited to breakdown of skin: Secondary | ICD-10-CM

## 2018-05-02 DIAGNOSIS — L97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d: Secondary | ICD-10-CM

## 2018-05-02 NOTE — Progress Notes (Signed)
[name], [name] ([id number]) Visit Report for 04/29/2018 Chief Complaint Document Details Patient Name: [name], [name] Date of Service: 04/29/2018 8:45 AM Medical Record Number: [id number] Patient Account Number: [id number] Date of Birth/Sex: 08/15/1981 (37 y.o. F) Treating RN: Primary Care Provider: PATIENT, NO Other Clinician: Referring Provider: Referral, Self Treating Provider/Extender: STONE III, Oseas Detty Weeks in Treatment: 70 Information Obtained from: Patient Chief Complaint She is here in follow up evaluation of right lower extremity ulcers Electronic Signature(s) Signed: 04/29/2018 11:45:54 PM By: Lenda Kelp PA-C Entered By: Lenda Kelp on 04/29/2018 09:38:38 [name], [name] ([id number]) -------------------------------------------------------------------------------- Debridement Details Patient Name: Jessica Casey R. Date of Service: 04/29/2018 8:45 AM Medical Record Number: [id number] Patient Account Number: [id number] Date of Birth/Sex: 06/26/1981 (37 y.o. F) Treating RN: Renne Crigler Primary Care Provider: PATIENT, NO Other Clinician: Referring Provider: Referral, Self Treating Provider/Extender: STONE III, Catrell Morrone Weeks in Treatment: 33 Debridement Performed for Wound #2 Right,Proximal,Lateral Lower Leg Assessment: Performed By: Physician STONE III, Keyona Emrich E., PA-C Debridement Type: Debridement Pre-procedure Verification/Time Yes - 09:42 Out Taken: Start Time: 09:42 Pain Control: Other : lidocaine 4% Total Area Debrided (L x W): 3.6 (cm) x 4.2 (cm) = 15.12 (cm) Tissue and other material Viable, Non-Viable, Slough, Subcutaneous, Biofilm, Slough debrided: Level: Skin/Subcutaneous Tissue Debridement Description: Excisional Instrument: Curette Bleeding: Minimum Hemostasis Achieved: Pressure End Time: 09:43 Procedural Pain: 0 Post Procedural Pain: 0 Response to Treatment: Procedure was tolerated well Level of Consciousness: Awake and Alert Post Procedure  Vitals: Temperature: 98.3 Pulse: 91 Respiratory Rate: 18 Blood Pressure: Systolic Blood Pressure: 129 Diastolic Blood Pressure: 86 Post Debridement Measurements of Total Wound Length: (cm) 3.6 Width: (cm) 4.2 Depth: (cm) 0.2 Volume: (cm) 2.375 Character of Wound/Ulcer Post Debridement: Stable Post Procedure Diagnosis Same as Pre-procedure Electronic Signature(s) Signed: 04/29/2018 11:45:54 PM By: Lenda Kelp PA-C Signed: 04/30/2018 11:44:07 AM By: Renne Crigler Entered By: Renne Crigler on 04/29/2018 09:44:20 [name], [name] ([id number]) -------------------------------------------------------------------------------- Debridement Details Patient Name: Jessica Casey R. Date of Service: 04/29/2018 8:45 AM Medical Record Number: [id number] Patient Account Number: [id number] Date of Birth/Sex: 14-Apr-1981 (37 y.o. F) Treating RN: Renne Crigler Primary Care Provider: PATIENT, NO Other Clinician: Referring Provider: Referral, Self Treating Provider/Extender: STONE III, Tannisha Kennington Weeks in Treatment: 33 Debridement Performed for Wound #4 Right,Distal,Lateral Lower Leg Assessment: Performed By: Physician STONE III, Faith Patricelli E., PA-C Debridement Type: Debridement Pre-procedure Verification/Time Yes - 09:42 Out Taken: Start Time: 09:42 Pain Control: Other : lidocaine 4% Total Area Debrided (L x W): 0.2 (cm) x 0.2 (cm) = 0.04 (cm) Tissue and other material Viable, Non-Viable, Slough, Subcutaneous, Biofilm, Slough debrided: Level: Skin/Subcutaneous Tissue Debridement Description: Excisional Instrument: Curette Bleeding: Minimum Hemostasis Achieved: Pressure End Time: 09:43 Procedural Pain: 0 Post Procedural Pain: 0 Response to Treatment: Procedure was tolerated well Level of Consciousness: Awake and Alert Post Procedure Vitals: Temperature: 98.3 Pulse: 91 Respiratory Rate: 18 Blood Pressure: Systolic Blood Pressure: 129 Diastolic Blood Pressure: 86 Post Debridement  Measurements of Total Wound Length: (cm) 0.2 Width: (cm) 0.2 Depth: (cm) 0.2 Volume: (cm) 0.006 Character of Wound/Ulcer Post Debridement: Stable Post Procedure Diagnosis Same as Pre-procedure Electronic Signature(s) Signed: 04/29/2018 11:45:54 PM By: Lenda Kelp PA-C Signed: 04/30/2018 11:44:07 AM By: Renne Crigler Entered By: Renne Crigler on 04/29/2018 09:45:06 [name], [name] ([id number]) -------------------------------------------------------------------------------- HPI Details Patient Name: Jessica Casey R. Date of Service: 04/29/2018 8:45 AM Medical Record Number: [id number] Patient Account Number: [id number] Date of Birth/Sex: November 11, 1981 (37 y.o. F) Treating RN: Primary Care Provider: PATIENT, NO Other Clinician: Referring  Provider: Referral, Self Treating Provider/Extender: STONE III, Lavante Toso Weeks in Treatment: 63 History of Present Illness HPI Description: 37 year old patient well known to our [facility] wound care clinic where she has been seen since 2016 for bilateral lower extremity venous insufficiency disease with lymphedema and multiple ulcerations associated with morbid obesity. she had custom-made compression stockings and lymphedema pumps which were used in the past. most recently she was admitted to the hospital between October 11 and 09/02/2017 with sepsis, lower extremity wounds and lymphedema.she was initially treated in the outpatient with Keflex and Bactrim. she was initially treated in the hospital with vancomycin and Zosyn and changed over to Unasyn until her white count improved and her blood cultures were negative for 3 days. After her inpatient management she was discharged home on Augmentin to end on 09/13/2017 with a 14 day course. she has had outpatient vascular duplex scans completed in November 2017 and her right ABI was 1.1 and the left ABI is 1.3. she had normal toe brachial indices bilaterally.she had three-vessel runoff in the right lower  extremity and two-vessel runoff in the left lower extremity. On questioning the patient she does have custom made compression stockings and also has a lymphedema pump but has not been using it appropriately and has not been taking good care of herself. 09/17/2017 -- she returns today with compression stockings on the left side and the right side has had significant amount of drainage and has a very strong odor 09/24/2017 -- the drainage is increased significantly and she has more lymphedema and a very strong odor to her wound. Though she does not have systemic symptoms, or overt infection I believe she will benefit from some doxycycline given empirically. 10/01/2017 -- after starting the doxycycline and changing the dressing twice a week her symptoms and signs have definitely improved overall. 10/08/2017 -- she has completed her course of doxycycline but continues to have a lot of drainage and needs twice a week dressing changes. 11/08/17-she is here in follow-up evaluation for right lower extremity ulcers. She admits to using her lymphedema pumps twice daily, one hour per session. she is voicing no complaints or concerns, no signs of infection will change to [facility] 12/14/17 on evaluation today patient appears to be doing very well in regard to her wounds. She has been tolerating the dressing changes she continues to develop some portly the adherent granular tissue on the surface of the wound with some Slough. Obviously we are trying to get too much better wound bed. With that being said the hyper granulation the Hydrofera Blue Dressing to have helped with which is excellent news. However I think it may be time to try something a little bit different at this point. 01/11/18 on evaluation today patient appears to be doing fairly well in regard to her right lateral lower extremity ulcers. This shows excellent signs of filling in which is great news. There does not appear to be any evidence of  infection which is also good news. She does continue to work as well is good school. She is having no pain. 01/22/18 on evaluation today patient appears to be doing a little bit worse in my opinion in regard to the overall quality of that granulation on her right lower extremity. She was not here last week due to being sick with a stomach virus this may have something to do with the fact that her wound appears to be a little bit worse. With that being said I'm also thinking that after switching  from the [facility] Dressing to the silver collagen would really has not looked that's good in my opinion. We may want to swit 02/11/18 on evaluation today patient's right lower/lateral lower extremity ulcers appear to be doing very well at this point. [name], [name] ([id number]) Especially the more proximal ulcer has filled in much closer to surface which is good news. Nonetheless both show signs of improvement which is great news. There does not appear to be any evidence of infection which is also good news. In general patient has been doing well tolerating the wraps as well as the [employer]. 02/18/18 on evaluation today patient appears to be doing a little bit worse in regard to the periwound region the wounds themselves do not look much deteriorated to me. With that being said she has several small blisters/pustules noted in the periwound and there was a significant amount of drainage and maceration compared to previous. There has been a time that we had to bring her back for twice a week dressing changes as far as her wrap was concerned it has been a while since we've done that however. With that being said the patient has been having some burning and in general I'm concerned about the possibility of infection. She has previously taken doxycycline with good result. Fortunately there does not appear to be any evidence of overall worsening in regard to the size of the wound and in fact the  upper wound actually appears to be showing signs of good epithelialization. 03/18/18 on evaluation today patient appears to be doing excellent in regard to her right lateral lower extremity ulcer. She has been tolerating the dressing changes without complication. Fortunately this seems to be making great progress. Overall I see no signs of infection and there is dramatic improvement overall even compared to last week. 03/28/18 on evaluation today patient appears to be doing very well in regard to her right lateral lower surety ulcer. She has been tolerating the dressing changes without complication at this point. She states currently that she's having no significant discomfort which is excellent as well. Overall I'm pleased with how things seem to be progressing. 04/01/18 on evaluation today patient appears to be doing excellent in regard to her right lateral lower extremity ulcers. She has been tolerating the dressing changes without complication which is good news. With that being said the wraps still continues to show signs of helping with her fluid she does have Juxta-Lite compression stockings for when we are done with the current treatment regimen once everything heals. Mainly she just has the one area still remaining the smaller of the two wings is pretty much closed at this point there's just a very slight opening noted. 04/08/18 on evaluation today patient appears to be doing better in regard to the original wound on the right lateral lower extremity that we have been managing. Unfortunately she has a new area of weeping more anterior on the right lateral lower extremity and on the left lower extremity she has two new ulcers there appears to be some cellulitis noted at this time. I am concerned about the fact that this may in fact be an infection that has caused the worsening and swelling in the past this has been the case when we previously attempted to determine what was going on when she had  down slides like this. With that being said the patient is seeming to tolerate the wraps fairly well for the most part. 04/17/18; since last time the  patient was seen in this clinic she was hospitalized from 04/11/18 through 04/13/18; she presented with bilateral lower extremity pain worse on the right and a fever of up to 104. Noteworthy that when she was in the clinic last week she had new wounds on the left leg culture grew MRSA and she was prescribed Bactrim. She had 2 days of IV Vanco and Zosyn in the hospital. Her blood cultures were negative. She was discharged on Bactrim to cover the original MRSA on the right leg and Keflex to cover the possibility of strep. The hospitalist had a conversation with infectious disease. The patient arrives in clinic today for nurse check however given the recent hospitalization I was asked to look at her. The patient states she feels a lot better. No fever or chills. Still some pain in the right calf but a lot better. She arrived in the hospital with a white count of 15.6, the next day was 10.8. Comprehensive metabolic panel was normal. She is still taking Keflex and Bactrim It would appear that she had a surgical IandD at the bedside of the right calf felt to have a underlying abscess. According to our intake nurse the wound has expanded quite bit on the right lateral calf. She has no open area on the left anterior and left posterior calf as described last time 04/23/18 on evaluation today patient actually appears to be doing much better than when I last saw her. This obviously has been a couple weeks ago and in the interim she was admitted to the hospital for IV antibiotic therapy due to cellulitis, discharge, and fortunately the substance which hadn't sued has completely resolved. Her swelling seems to be better in regard to her lower extremities as well and the wounds that opened up as results of the infection seems to be showing signs of improvement. There is  some Slough noted on the left lateral wound otherwise a lot of weeping noted of the right lateral leg. 04/29/18 on evaluation today patient appears to be doing excellent in regard to her bilateral lower extremity ulcers. She's been tolerating the dressing changes without complication there does not appear to be any evidence of infection at this time. Overall I think she is made great improvement and seems to be showing signs of coming back around where she was prior to the cellulitis and sepsis episode. ARWYN, BESAW ([id number]) Electronic Signature(s) Signed: 04/29/2018 11:45:54 PM By: Lenda Kelp PA-C Entered By: Lenda Kelp on 04/29/2018 18:20:45 [name] ([id number]) -------------------------------------------------------------------------------- Physical Exam Details Patient Name: Jessica Casey R. Date of Service: 04/29/2018 8:45 AM Medical Record Number: [id number] Patient Account Number: [id number] Date of Birth/Sex: 07-30-1981 (37 y.o. F) Treating RN: Primary Care Provider: PATIENT, NO Other Clinician: Referring Provider: Referral, Self Treating Provider/Extender: STONE III, Dominque Levandowski Weeks in Treatment: 33 Constitutional Chronically ill appearing but in no apparent acute distress. Respiratory normal breathing without difficulty. Psychiatric this patient is able to make decisions and demonstrates good insight into disease process. Alert and Oriented x 3. pleasant and cooperative. Notes At this time I'm extremely pleased again as mentioned with the progress she's made she did require some debridement in regard to the right lateral lower [place] ulcers which he tolerated today without complication post debridement the wounds appear to be doing better and overall I think she has a much better outlook at healing. Electronic Signature(s) Signed: 04/29/2018 11:45:54 PM By: Lenda Kelp PA-C Entered By: Lenda Kelp on 04/29/2018 18:21:45 Mccorry, Latina  RMarland Kitchen  ([id number]) -------------------------------------------------------------------------------- Physician Orders Details Patient Name: MIRIA, CAPPELLI R. Date of Service: 04/29/2018 8:45 AM Medical Record Number: [id number] Patient Account Number: [id number] Date of Birth/Sex: 11/07/1981 (37 y.o. F) Treating RN: Renne Crigler Primary Care Provider: PATIENT, NO Other Clinician: Referring Provider: Referral, Self Treating Provider/Extender: STONE III, Arnaldo Heffron Weeks in Treatment: 87 Verbal / Phone Orders: No Diagnosis Coding ICD-10 Coding Code Description [phone] 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d L97.221 Non-pressure chronic ulcer of left calf limited to breakdown of skin I89.0 Lymphedema, not elsewhere classified I87.311 Chronic venous hypertension (idiopathic) with ulcer of right lower extremity E66.01 Morbid (severe) obesity due to excess calories Wound Cleansing Wound #2 Right,Proximal,Lateral Lower Leg o Clean wound with Normal Saline. o Cleanse wound with mild soap and water Wound #4 Right,Distal,Lateral Lower Leg o Clean wound with Normal Saline. o Cleanse wound with mild soap and water Wound #5 Left,Anterior Lower Leg o Clean wound with Normal Saline. o Cleanse wound with mild soap and water Anesthetic (add to Medication List) Wound #2 Right,Proximal,Lateral Lower Leg o Topical Lidocaine 4% cream applied to wound bed prior to debridement (In Clinic Only). Wound #4 Right,Distal,Lateral Lower Leg o Topical Lidocaine 4% cream applied to wound bed prior to debridement (In Clinic Only). Wound #5 Left,Anterior Lower Leg o Topical Lidocaine 4% cream applied to wound bed prior to debridement (In Clinic Only). Skin Barriers/Peri-Wound Care o Moisturizing lotion Primary Wound Dressing Wound #2 Right,Proximal,Lateral Lower Leg o Hydrafera Blue Ready Transfer Wound #4 Right,Distal,Lateral Lower Leg o Hydrafera Blue Ready  Transfer Wound #5 Left,Anterior Lower Leg o Hydrafera Blue Ready Transfer LISSANDRA, KEIL ([id number]) Secondary Dressing Wound #2 Right,Proximal,Lateral Lower Leg o ABD pad o XtraSorb Wound #4 Right,Distal,Lateral Lower Leg o ABD pad o XtraSorb Wound #5 Left,Anterior Lower Leg o ABD pad o XtraSorb Dressing Change Frequency Wound #2 Right,Proximal,Lateral Lower Leg o Other: - twice weekly, return for nurse visit friday Wound #4 Right,Distal,Lateral Lower Leg o Other: - twice weekly, return for nurse visit friday Wound #5 Left,Anterior Lower Leg o Other: - twice weekly, return for nurse visit friday Follow-up Appointments Wound #2 Right,Proximal,Lateral Lower Leg o Return Appointment in 1 week. o Nurse Visit as needed o Other: - twice weekly clinic visit, return on friday for nurse visit Wound #4 Right,Distal,Lateral Lower Leg o Return Appointment in 1 week. o Nurse Visit as needed o Other: - twice weekly clinic visit, return on friday for nurse visit Wound #5 Left,Anterior Lower Leg o Nurse Visit as needed o Other: - twice weekly clinic visit, return on friday for nurse visit Edema Control Wound #2 Right,Proximal,Lateral Lower Leg o 4 Layer Compression System - Bilateral - unna to anchor o Compression Pump: Use compression pump on left lower extremity for 30 minutes, twice daily. - one hour use o Compression Pump: Use compression pump on right lower extremity for 30 minutes, twice daily. - one hour use Wound #4 Right,Distal,Lateral Lower Leg o 4 Layer Compression System - Bilateral - unna to anchor o Compression Pump: Use compression pump on left lower extremity for 30 minutes, twice daily. - one hour use o Compression Pump: Use compression pump on right lower extremity for 30 minutes, twice daily. - one hour use Wound #5 Left,Anterior Lower Leg o 4 Layer Compression System - Bilateral - unna to anchor o Compression Pump:  Use compression pump on left lower extremity for 30 minutes, twice daily. - one hour use o Compression Pump: Use compression pump on right  lower extremity for 30 minutes, twice daily. - one hour use [name], [name] ([id number]) Additional Orders / Instructions o Vitamin A; Vitamin C, Zinc o Increase protein intake. Electronic Signature(s) Signed: 04/29/2018 11:45:54 PM By: Lenda Kelp PA-C Signed: 04/30/2018 11:44:07 AM By: Renne Crigler Entered By: Renne Crigler on 04/29/2018 09:46:03 Borum, Redith RMarland Kitchen ([id number]) -------------------------------------------------------------------------------- Problem List Details Patient Name: Jessica Casey R. Date of Service: 04/29/2018 8:45 AM Medical Record Number: [id number] Patient Account Number: [id number] Date of Birth/Sex: 02/06/1981 (37 y.o. F) Treating RN: Primary Care Provider: PATIENT, NO Other Clinician: Referring Provider: Referral, Self Treating Provider/Extender: STONE III, Layth Cerezo Weeks in Treatment: 33 Active Problems ICD-10 Impacting Encounter Code Description Active Date Wound Healing Diagnosis L97.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d 09/10/2017 No Yes L97.221 Non-pressure chronic ulcer of left calf limited to breakdown of 04/17/2018 No Yes skin I89.0 Lymphedema, not elsewhere classified 09/10/2017 No Yes I87.311 Chronic venous hypertension (idiopathic) with ulcer of right 09/10/2017 No Yes lower extremity E66.01 Morbid (severe) obesity due to excess calories 09/10/2017 No Yes Inactive Problems Resolved Problems Electronic Signature(s) Signed: 04/29/2018 11:45:54 PM By: Lenda Kelp PA-C Entered By: Lenda Kelp on 04/29/2018 09:38:32 Dinning, Angeleigh R. ([id number]) -------------------------------------------------------------------------------- Progress Note Details Patient Name: Jessica Casey R. Date of Service: 04/29/2018 8:45 AM Medical Record Number: [id number] Patient Account  Number: [id number] Date of Birth/Sex: 29-May-1981 (37 y.o. F) Treating RN: Primary Care Provider: PATIENT, NO Other Clinician: Referring Provider: Referral, Self Treating Provider/Extender: STONE III, Jasn Xia Weeks in Treatment: 33 Subjective Chief Complaint Information obtained from Patient She is here in follow up evaluation of right lower extremity ulcers History of Present Illness (HPI) 37 year old patient well known to our [facility] wound care clinic where she has been seen since 2016 for bilateral lower extremity venous insufficiency disease with lymphedema and multiple ulcerations associated with morbid obesity. she had custom-made compression stockings and lymphedema pumps which were used in the past. most recently she was admitted to the hospital between October 11 and 09/02/2017 with sepsis, lower extremity wounds and lymphedema.she was initially treated in the outpatient with Keflex and Bactrim. she was initially treated in the hospital with vancomycin and Zosyn and changed over to Unasyn until her white count improved and her blood cultures were negative for 3 days. After her inpatient management she was discharged home on Augmentin to end on 09/13/2017 with a 14 day course. she has had outpatient vascular duplex scans completed in November 2017 and her right ABI was 1.1 and the left ABI is 1.3. she had normal toe brachial indices bilaterally.she had three-vessel runoff in the right lower extremity and two-vessel runoff in the left lower extremity. On questioning the patient she does have custom made compression stockings and also has a lymphedema pump but has not been using it appropriately and has not been taking good care of herself. 09/17/2017 -- she returns today with compression stockings on the left side and the right side has had significant amount of drainage and has a very strong odor 09/24/2017 -- the drainage is increased significantly and she has more lymphedema and a  very strong odor to her wound. Though she does not have systemic symptoms, or overt infection I believe she will benefit from some doxycycline given empirically. 10/01/2017 -- after starting the doxycycline and changing the dressing twice a week her symptoms and signs have definitely improved overall. 10/08/2017 -- she has completed her course of doxycycline but continues to have a lot of drainage  and needs twice a week dressing changes. 11/08/17-she is here in follow-up evaluation for right lower extremity ulcers. She admits to using her lymphedema pumps twice daily, one hour per session. she is voicing no complaints or concerns, no signs of infection will change to [facility] 12/14/17 on evaluation today patient appears to be doing very well in regard to her wounds. She has been tolerating the dressing changes she continues to develop some portly the adherent granular tissue on the surface of the wound with some Slough. Obviously we are trying to get too much better wound bed. With that being said the hyper granulation the Hydrofera Blue Dressing to have helped with which is excellent news. However I think it may be time to try something a little bit different at this point. 01/11/18 on evaluation today patient appears to be doing fairly well in regard to her right lateral lower extremity ulcers. This shows excellent signs of filling in which is great news. There does not appear to be any evidence of infection which is also good news. She does continue to work as well is good school. She is having no pain. 01/22/18 on evaluation today patient appears to be doing a little bit worse in my opinion in regard to the overall quality of that Rayman, Tynia R. ([id number]) granulation on her right lower extremity. She was not here last week due to being sick with a stomach virus this may have something to do with the fact that her wound appears to be a little bit worse. With that being said I'm also thinking that  after switching from the [facility] Dressing to the silver collagen would really has not looked that's good in my opinion. We may want to swit 02/11/18 on evaluation today patient's right lower/lateral lower extremity ulcers appear to be doing very well at this point. Especially the more proximal ulcer has filled in much closer to surface which is good news. Nonetheless both show signs of improvement which is great news. There does not appear to be any evidence of infection which is also good news. In general patient has been doing well tolerating the wraps as well as the [employer]. 02/18/18 on evaluation today patient appears to be doing a little bit worse in regard to the periwound region the wounds themselves do not look much deteriorated to me. With that being said she has several small blisters/pustules noted in the periwound and there was a significant amount of drainage and maceration compared to previous. There has been a time that we had to bring her back for twice a week dressing changes as far as her wrap was concerned it has been a while since we've done that however. With that being said the patient has been having some burning and in general I'm concerned about the possibility of infection. She has previously taken doxycycline with good result. Fortunately there does not appear to be any evidence of overall worsening in regard to the size of the wound and in fact the upper wound actually appears to be showing signs of good epithelialization. 03/18/18 on evaluation today patient appears to be doing excellent in regard to her right lateral lower extremity ulcer. She has been tolerating the dressing changes without complication. Fortunately this seems to be making great progress. Overall I see no signs of infection and there is dramatic improvement overall even compared to last week. 03/28/18 on evaluation today patient appears to be doing very well in regard to her right  lateral lower surety ulcer. She has been tolerating the dressing changes without complication at this point. She states currently that she's having no significant discomfort which is excellent as well. Overall I'm pleased with how things seem to be progressing. 04/01/18 on evaluation today patient appears to be doing excellent in regard to her right lateral lower extremity ulcers. She has been tolerating the dressing changes without complication which is good news. With that being said the wraps still continues to show signs of helping with her fluid she does have Juxta-Lite compression stockings for when we are done with the current treatment regimen once everything heals. Mainly she just has the one area still remaining the smaller of the two wings is pretty much closed at this point there's just a very slight opening noted. 04/08/18 on evaluation today patient appears to be doing better in regard to the original wound on the right lateral lower extremity that we have been managing. Unfortunately she has a new area of weeping more anterior on the right lateral lower extremity and on the left lower extremity she has two new ulcers there appears to be some cellulitis noted at this time. I am concerned about the fact that this may in fact be an infection that has caused the worsening and swelling in the past this has been the case when we previously attempted to determine what was going on when she had down slides like this. With that being said the patient is seeming to tolerate the wraps fairly well for the most part. 04/17/18; since last time the patient was seen in this clinic she was hospitalized from 04/11/18 through 04/13/18; she presented with bilateral lower extremity pain worse on the right and a fever of up to 104. Noteworthy that when she was in the clinic last week she had new wounds on the left leg culture grew MRSA and she was prescribed Bactrim. She had 2 days of IV Vanco and Zosyn in the  hospital. Her blood cultures were negative. She was discharged on Bactrim to cover the original MRSA on the right leg and Keflex to cover the possibility of strep. The hospitalist had a conversation with infectious disease. The patient arrives in clinic today for nurse check however given the recent hospitalization I was asked to look at her. The patient states she feels a lot better. No fever or chills. Still some pain in the right calf but a lot better. She arrived in the hospital with a white count of 15.6, the next day was 10.8. Comprehensive metabolic panel was normal. She is still taking Keflex and Bactrim It would appear that she had a surgical IandD at the bedside of the right calf felt to have a underlying abscess. According to our intake nurse the wound has expanded quite bit on the right lateral calf. She has no open area on the left anterior and left posterior calf as described last time 04/23/18 on evaluation today patient actually appears to be doing much better than when I last saw her. This obviously has been a couple weeks ago and in the interim she was admitted to the hospital for IV antibiotic therapy due to cellulitis, discharge, and fortunately the substance which hadn't sued has completely resolved. Her swelling seems to be better in regard to her lower extremities as well and the wounds that opened up as results of the infection seems to be showing signs of improvement. There is some Slough noted on the left lateral wound otherwise a lot  of weeping noted of the right lateral leg. Jessica HindLEATH, [name] ([id number]) 04/29/18 on evaluation today patient appears to be doing excellent in regard to her bilateral lower extremity ulcers. She's been tolerating the dressing changes without complication there does not appear to be any evidence of infection at this time. Overall I think she is made great improvement and seems to be showing signs of coming back around where she was prior to the  cellulitis and sepsis episode. Patient History Information obtained from Patient. Family History Kidney Disease - Mother, No family history of Cancer, Diabetes, Heart Disease, Hereditary Spherocytosis, Hypertension, Lung Disease, Seizures, Stroke, Thyroid Problems, Tuberculosis. Social History Never smoker, Marital Status - Married, Alcohol Use - Never, Drug Use - No History, Caffeine Use - Daily. Review of Systems (ROS) Constitutional Symptoms (General Health) Denies complaints or symptoms of Fever, Chills. Respiratory The patient has no complaints or symptoms. Cardiovascular The patient has no complaints or symptoms. Psychiatric The patient has no complaints or symptoms. Objective Constitutional Chronically ill appearing but in no apparent acute distress. Vitals Time Taken: 9:01 AM, Height: 74 in, Weight: 486 lbs, BMI: 62.4, Temperature: 98.3 F, Pulse: 91 bpm, Respiratory Rate: 20 breaths/min, Blood Pressure: 129/86 mmHg. Respiratory normal breathing without difficulty. Psychiatric this patient is able to make decisions and demonstrates good insight into disease process. Alert and Oriented x 3. pleasant and cooperative. General Notes: At this time I'm extremely pleased again as mentioned with the progress she's made she did require some debridement in regard to the right lateral lower [place] ulcers which he tolerated today without complication post debridement the wounds appear to be doing better and overall I think she has a much better outlook at healing. Integumentary (Hair, Skin) Wound #2 status is Open. Original cause of wound was Gradually Appeared. The wound is located on the LimonLEATH, [name] ([id number]) Right,Proximal,Lateral Lower Leg. The wound measures 3.6cm length x 4.2cm width x 0.1cm depth; 11.875cm^2 area and 1.188cm^3 volume. There is Fat Layer (Subcutaneous Tissue) Exposed exposed. There is no tunneling or undermining noted. There is a large amount of serous  drainage noted. There is large (67-100%) red granulation within the wound bed. There is a small (1-33%) amount of necrotic tissue within the wound bed including Adherent Slough. The periwound skin appearance did not exhibit: Callus, Crepitus, Excoriation, Induration, Rash, Scarring, Dry/Scaly, Maceration, Atrophie Blanche, Cyanosis, Ecchymosis, Hemosiderin Staining, Mottled, Pallor, Rubor, Erythema. Wound #4 status is Open. Original cause of wound was Gradually Appeared. The wound is located on the Right,Distal,Lateral Lower Leg. The wound measures 0.2cm length x 0.2cm width x 0.1cm depth; 0.031cm^2 area and 0.003cm^3 volume. There is Fat Layer (Subcutaneous Tissue) Exposed exposed. There is no tunneling or undermining noted. There is a large amount of serous drainage noted. The wound margin is flat and intact. There is large (67-100%) red granulation within the wound bed. There is a small (1-33%) amount of necrotic tissue within the wound bed including Adherent Slough. The periwound skin appearance did not exhibit: Callus, Crepitus, Excoriation, Induration, Rash, Scarring, Dry/Scaly, Maceration, Atrophie Blanche, Cyanosis, Ecchymosis, Hemosiderin Staining, Mottled, Pallor, Rubor, Erythema. Periwound temperature was noted as No Abnormality. Wound #5 status is Open. Original cause of wound was Gradually Appeared. The wound is located on the Left,Anterior Lower Leg. The wound measures 1.9cm length x 2.4cm width x 0.1cm depth; 3.581cm^2 area and 0.358cm^3 volume. There is Fat Layer (Subcutaneous Tissue) Exposed exposed. There is no tunneling or undermining noted. There is a large amount of serous drainage noted.  The wound margin is distinct with the outline attached to the wound base. There is large (67-100%) red granulation within the wound bed. There is a small (1-33%) amount of necrotic tissue within the wound bed including Adherent Slough. The periwound skin appearance did not exhibit: Callus,  Crepitus, Excoriation, Induration, Rash, Scarring, Dry/Scaly, Maceration, Atrophie Blanche, Cyanosis, Ecchymosis, Hemosiderin Staining, Mottled, Pallor, Rubor, Erythema. Periwound temperature was noted as No Abnormality. Wound #6 status is Healed - Epithelialized. Original cause of wound was Gradually Appeared. The wound is located on the Left,Posterior Lower Leg. The wound measures 0cm length x 0cm width x 0cm depth; 0cm^2 area and 0cm^3 volume. 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 There was a Excisional Skin/Subcutaneous Tissue Debridement with a total area of 15.12 sq cm performed by STONE III, Jennifer Holland E., PA-C. With the following instrument(s): Curette to remove Viable and Non-Viable tissue/material. Material removed includes Subcutaneous Tissue, Slough, and Biofilm after achieving pain control using Other (lidocaine 4%). No specimens were taken. A time out was conducted at 09:42, prior to the start of the procedure. A Minimum amount of bleeding was controlled with Pressure. The procedure was tolerated well with a pain level of 0 throughout and a pain level of 0 following the procedure. Patient s Level of Consciousness post procedure was recorded as Awake and Alert. Post Debridement Measurements: 3.6cm length x 4.2cm width x 0.2cm depth; 2.375cm^3 volume. SUGAR, VANZANDT RMarland Kitchen ([id number]) Character of Wound/Ulcer Post Debridement is stable. Post procedure Diagnosis Wound #2: Same as Pre-Procedure Wound #4 Pre-procedure diagnosis of Wound #4 is a Lymphedema located on the  Right,Distal,Lateral Lower Leg . There was a Excisional Skin/Subcutaneous Tissue Debridement with a total area of 0.04 sq cm performed by STONE III, Zerina Hallinan E., PA-C. With the following instrument(s): Curette to remove Viable and Non-Viable tissue/material. Material removed includes Subcutaneous Tissue, Slough, and Biofilm after achieving pain control using Other (lidocaine 4%). No specimens were taken. A time out was conducted at 09:42, prior to the start of the procedure. A Minimum amount of bleeding was controlled with Pressure. The procedure was tolerated well with a pain level of 0 throughout and a pain level of 0 following the procedure. Patient s Level of Consciousness post procedure was recorded as Awake and Alert. Post Debridement Measurements: 0.2cm length x 0.2cm width x 0.2cm depth; 0.006cm^3 volume. Character of Wound/Ulcer Post Debridement is stable. Post procedure Diagnosis Wound #4: Same as Pre-Procedure Plan Wound Cleansing: Wound #2 Right,Proximal,Lateral Lower Leg: Clean wound with Normal Saline. Cleanse wound with mild soap and water Wound #4 Right,Distal,Lateral Lower Leg: Clean wound with Normal Saline. Cleanse wound with mild soap and water Wound #5 Left,Anterior Lower Leg: Clean wound with Normal Saline. Cleanse wound with mild soap and water Anesthetic (add to Medication List): Wound #2 Right,Proximal,Lateral Lower Leg: Topical Lidocaine 4% cream applied to wound bed prior to debridement (In Clinic Only). Wound #4 Right,Distal,Lateral Lower Leg: Topical Lidocaine 4% cream applied to wound bed prior to debridement (In Clinic Only). Wound #5 Left,Anterior Lower Leg: Topical Lidocaine 4% cream applied to wound bed prior to debridement (In Clinic Only). Skin Barriers/Peri-Wound Care: Moisturizing lotion Primary Wound Dressing: Wound #2 Right,Proximal,Lateral  Lower Leg: Hydrafera Blue Ready Transfer Wound #4 Right,Distal,Lateral Lower Leg: Hydrafera Blue Ready  Transfer Wound #5 Left,Anterior Lower Leg: Hydrafera Blue Ready Transfer Secondary Dressing: Wound #2 Right,Proximal,Lateral Lower Leg: ABD pad XtraSorb Wound #4 Right,Distal,Lateral Lower Leg: ABD pad XtraSorb Wound #5 Left,Anterior Lower Leg: ABD pad Eda Paschal, Felicitas R. ([id number]) Dressing Change Frequency: Wound #2 Right,Proximal,Lateral Lower Leg: Other: - twice weekly, return for nurse visit friday Wound #4 Right,Distal,Lateral Lower Leg: Other: - twice weekly, return for nurse visit friday Wound #5 Left,Anterior Lower Leg: Other: - twice weekly, return for nurse visit friday Follow-up Appointments: Wound #2 Right,Proximal,Lateral Lower Leg: Return Appointment in 1 week. Nurse Visit as needed Other: - twice weekly clinic visit, return on friday for nurse visit Wound #4 Right,Distal,Lateral Lower Leg: Return Appointment in 1 week. Nurse Visit as needed Other: - twice weekly clinic visit, return on friday for nurse visit Wound #5 Left,Anterior Lower Leg: Nurse Visit as needed Other: - twice weekly clinic visit, return on friday for nurse visit Edema Control: Wound #2 Right,Proximal,Lateral Lower Leg: 4 Layer Compression System - Bilateral - unna to anchor Compression Pump: Use compression pump on left lower extremity for 30 minutes, twice daily. - one hour use Compression Pump: Use compression pump on right lower extremity for 30 minutes, twice daily. - one hour use Wound #4 Right,Distal,Lateral Lower Leg: 4 Layer Compression System - Bilateral - unna to anchor Compression Pump: Use compression pump on left lower extremity for 30 minutes, twice daily. - one hour use Compression Pump: Use compression pump on right lower extremity for 30 minutes, twice daily. - one hour use Wound #5 Left,Anterior Lower Leg: 4 Layer Compression System - Bilateral - unna to anchor Compression Pump: Use compression pump on left lower extremity for 30 minutes, twice daily. - one hour  use Compression Pump: Use compression pump on right lower extremity for 30 minutes, twice daily. - one hour use Additional Orders / Instructions: Vitamin A; Vitamin C, Zinc Increase protein intake. I'm gonna recommend that we continue with the Current wound care measures for the next week. Patient is in agreement with the plan. We will subsequently see were things stand at follow-up. Please see above for specific wound care orders. We will see patient for re-evaluation in 1 week(s) here in the clinic. If anything worsens or changes patient will contact our office for additional recommendations. Electronic Signature(s) Signed: 04/29/2018 11:45:54 PM By: Lenda Kelp PA-C Entered By: Lenda Kelp on 04/29/2018 18:22:05 [name] ([id number]) -------------------------------------------------------------------------------- ROS/PFSH Details Patient Name: Jessica Casey R. Date of Service: 04/29/2018 8:45 AM Medical Record Number: [id number] Patient Account Number: [id number] Date of Birth/Sex: [birth year] (37 y.o. F) Treating RN: Primary Care Provider: PATIENT, NO Other Clinician: Referring Provider: Referral, Self Treating Provider/Extender: STONE III, Wai Minotti Weeks in Treatment: 33 Information Obtained From Patient Wound History Do you currently have one or more open woundso Yes How many open wounds do you currently haveo 1 Approximately how long have you had your woundso 1 month How have you been treating your wound(s) until nowo aquacel ag Has your wound(s) ever healed and then re-openedo No Have you had any lab work done in the past montho No Have you tested positive for an antibiotic resistant organism (MRSA, VRE)o No Have you tested positive for osteomyelitis (bone infection)o No Have you had any tests for circulation on your legso Yes Who ordered the testo [facility] Where was the test doneo [facility] Constitutional Symptoms (General Health) Complaints and  Symptoms: Negative for:  Fever; Chills Hematologic/Lymphatic Medical History: Positive for: Lymphedema Respiratory Complaints and Symptoms: No Complaints or Symptoms Medical History: Negative for: Aspiration; Asthma; Chronic Obstructive Pulmonary Disease (COPD); Pneumothorax; Sleep Apnea; Tuberculosis Cardiovascular Complaints and Symptoms: No Complaints or Symptoms Medical History: Positive for: Hypertension Negative for: Angina; Arrhythmia; Congestive Heart Failure; Coronary Artery Disease; Deep Vein Thrombosis; Hypotension; Myocardial Infarction; Peripheral Arterial Disease; Peripheral Venous Disease; Phlebitis; Vasculitis Psychiatric Complaints and Symptoms: No Complaints or Symptoms [name], [name] ([id number]) Immunizations Pneumococcal Vaccine: Received Pneumococcal Vaccination: No Immunization Notes: up to date Implantable Devices Family and Social History Cancer: No; Diabetes: No; Heart Disease: No; Hereditary Spherocytosis: No; Hypertension: No; Kidney Disease: Yes - Mother; Lung Disease: No; Seizures: No; Stroke: No; Thyroid Problems: No; Tuberculosis: No; Never smoker; Marital Status - Married; Alcohol Use: Never; Drug Use: No History; Caffeine Use: Daily; Financial Concerns: No; Food, Clothing or Shelter Needs: No; Support System Lacking: No; Transportation Concerns: No; Advanced Directives: No; Patient does not want information on Advanced Directives Physician Affirmation I have reviewed and agree with the above information. Electronic Signature(s) Signed: 04/29/2018 11:45:54 PM By: Lenda Kelp PA-C Entered By: Lenda Kelp on 04/29/2018 18:21:04 Eugene, Cristal Deer ([id number]) -------------------------------------------------------------------------------- SuperBill Details Patient Name: Jessica Casey R. Date of Service: 04/29/2018 Medical Record Number: [id number] Patient Account Number: [id number] Date of Birth/Sex: 06/27/1981 (37 y.o. F) Treating RN: Primary Care Provider: PATIENT,  NO Other Clinician: Referring Provider: Referral, Self Treating Provider/Extender: STONE III, Doreen Garretson Weeks in Treatment: 33 Diagnosis Coding ICD-10 Codes Code Description L97.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d L97.221 Non-pressure chronic ulcer of left calf limited to breakdown of skin I89.0 Lymphedema, not elsewhere classified I87.311 Chronic venous hypertension (idiopathic) with ulcer of right lower extremity E66.01 Morbid (severe) obesity due to excess calories Facility Procedures CPT4 Code: 29528413 Description: 11042 - DEB SUBQ TISSUE 20 SQ CM/< ICD-10 Diagnosis Description L97.212 Non-pressure chronic ulcer of right calf with fat layer expo Modifier: sed Quantity: 1 Physician Procedures CPT4 Code: 2440102 Description: 11042 - WC PHYS SUBQ TISS 20 SQ CM ICD-10 Diagnosis Description L97.212 Non-pressure chronic ulcer of right calf with fat layer expo Modifier: sed Quantity: 1 Electronic Signature(s) Signed: 04/29/2018 11:45:54 PM By: Lenda Kelp PA-C Entered By: Lenda Kelp on 04/29/2018 18:22:19

## 2018-05-02 NOTE — Progress Notes (Signed)
[name], Nadalee R. ([id number]) Visit Report for 04/29/2018 Arrival Information Details Patient Name: [name], [name] Date of Service: 04/29/2018 8:45 AM Medical Record Number: [id number] Patient Account Number: [id number] Date of Birth/Sex: 03/01/1981 (37 y.o. F) Treating RN: Curtis Sitesorthy, Joanna Primary Care Brie Eppard: PATIENT, NO Other Clinician: Referring Ellyana Crigler: Referral, Self Treating Aniqua Briere/Extender: STONE III, HOYT Weeks in Treatment: 33 Visit Information History Since Last Visit Added or deleted any medications: No Patient Arrived: Ambulatory Any new allergies or adverse reactions: No Arrival Time: 09:00 Had a fall or experienced change in No Accompanied By: self activities of daily living that may affect Transfer Assistance: None risk of falls: Patient Identification Verified: Yes Signs or symptoms of abuse/neglect since last visito No Secondary Verification Process Completed: Yes Hospitalized since last visit: No Patient Requires Transmission-Based No Implantable device outside of the clinic excluding No Precautions: cellular tissue based products placed in the center Patient Has Alerts: No since last visit: Has Dressing in Place as Prescribed: Yes Has Compression in Place as Prescribed: Yes Pain Present Now: No Electronic Signature(s) Signed: 04/29/2018 5:19:07 PM By: Curtis Sitesorthy, Joanna Entered By: Curtis Sitesorthy, Joanna on 04/29/2018 09:01:17 [name], Jayleen R. ([id number]) -------------------------------------------------------------------------------- Encounter Discharge Information Details Patient Name: [name], [name] Date of Service: 04/29/2018 8:45 AM Medical Record Number: [id number] Patient Account Number: [id number] Date of Birth/Sex: 03/01/1981 (37 y.o. F) Treating RN: Phillis HaggisPinkerton, Debi Primary Care Docia Klar: PATIENT, NO Other Clinician: Referring Jack Mineau: Referral, Self Treating Ladarrian Asencio/Extender: STONE III, HOYT Weeks in Treatment: 33 Encounter Discharge Information  Items Discharge Condition: Stable Ambulatory Status: Ambulatory Discharge Destination: Home Transportation: Private Auto Accompanied By: self Schedule Follow-up Appointment: Yes Clinical Summary of Care: Electronic Signature(s) Signed: 04/29/2018 10:22:57 AM By: Alejandro MullingPinkerton, Debra Entered By: Alejandro MullingPinkerton, Debra on 04/29/2018 10:22:57 [name], Elfrida R. ([id number]) -------------------------------------------------------------------------------- Lower Extremity Assessment Details Patient Name: [name], [name] Date of Service: 04/29/2018 8:45 AM Medical Record Number: [id number] Patient Account Number: [id number] Date of Birth/Sex: 03/01/1981 (37 y.o. F) Treating RN: Curtis Sitesorthy, Joanna Primary Care Josphine Laffey: PATIENT, NO Other Clinician: Referring Kalob Bergen: Referral, Self Treating Kalli Greenfield/Extender: STONE III, HOYT Weeks in Treatment: 33 Edema Assessment Assessed: [Left: No] [Right: No] [Left: Edema] [Right: :] Calf Left: Right: Point of Measurement: 36 cm From Medial Instep 57.2 cm 58.1 cm Ankle Left: Right: Point of Measurement: 9 cm From Medial Instep 36.5 cm 36.4 cm Vascular Assessment Pulses: Dorsalis Pedis Palpable: [Left:Yes] [Right:Yes] Posterior Tibial Extremity colors, hair growth, and conditions: Extremity Color: [Left:Hyperpigmented] [Right:Hyperpigmented] Hair Growth on Extremity: [Left:Yes] [Right:Yes] Temperature of Extremity: [Left:Warm] [Right:Warm] Capillary Refill: [Left:< 3 seconds] [Right:< 3 seconds] Toe Nail Assessment Left: Right: Thick: Yes Yes Discolored: Yes Yes Deformed: Yes Yes Improper Length and Hygiene: Yes Yes Electronic Signature(s) Signed: 04/29/2018 5:19:07 PM By: Curtis Sitesorthy, Joanna Entered By: Curtis Sitesorthy, Joanna on 04/29/2018 09:10:17 [name], [name] ([id number]) -------------------------------------------------------------------------------- Multi Wound Chart Details Patient Name: [name], [name] Date of Service: 04/29/2018 8:45 AM Medical Record  Number: [id number] Patient Account Number: [id number] Date of Birth/Sex: 03/01/1981 (37 y.o. F) Treating RN: Renne CriglerFlinchum, Cheryl Primary Care Wesly Whisenant: PATIENT, NO Other Clinician: Referring Viann Nielson: Referral, Self Treating Jelene Albano/Extender: STONE III, HOYT Weeks in Treatment: 33 Vital Signs Height(in): 74 Pulse(bpm): 91 Weight(lbs): 486 Blood Pressure(mmHg): 129/86 Body Mass Index(BMI): 62 Temperature(F): 98.3 Respiratory Rate 20 (breaths/min): Photos: [2:No Photos] [4:No Photos] [5:No Photos] Wound Location: [2:Right Lower Leg - Lateral, Proximal] [4:Right Lower Leg - Lateral, Distal] [5:Left Lower Leg - Anterior] Wounding Event: [2:Gradually Appeared] [4:Gradually Appeared] [5:Gradually Appeared] Primary Etiology: [2:Lymphedema] [4:Lymphedema] [5:Lymphedema] Comorbid History: [2:Lymphedema, Hypertension] [4:Lymphedema, Hypertension] [5:Lymphedema,  Hypertension] Date Acquired: [2:08/08/2017] [4:04/07/2018] [5:04/07/2018] Weeks of Treatment: [2:33] [4:3] [5:3] Wound Status: [2:Open] [4:Open] [5:Open] Clustered Wound: [2:Yes] [4:No] [5:No] Measurements L x W x D [2:3.6x4.2x0.1] [4:0.2x0.2x0.1] [5:1.9x2.4x0.1] (cm) Area (cm) : [2:11.875] [4:0.031] [5:3.581] Volume (cm) : [2:1.188] [4:0.003] [5:0.358] % Reduction in Area: [2:44.60%] [4:99.70%] [5:53.50%] % Reduction in Volume: [2:81.50%] [4:99.70%] [5:90.70%] Classification: [2:Full Thickness With Exposed Support Structures] [4:Partial Thickness] [5:Full Thickness Without Exposed Support Structures] Exudate Amount: [2:Large] [4:Large] [5:Large] Exudate Type: [2:Serous] [4:Serous] [5:Serous] Exudate Color: [2:amber] [4:amber] [5:amber] Wound Margin: [2:N/A] [4:Flat and Intact] [5:Distinct, outline attached] Granulation Amount: [2:Large (67-100%)] [4:Large (67-100%)] [5:Large (67-100%)] Granulation Quality: [2:Red] [4:Red] [5:Red] Necrotic Amount: [2:Small (1-33%)] [4:Small (1-33%)] [5:Small (1-33%)] Exposed Structures: [2:Fat  Layer (Subcutaneous Tissue) Exposed: Yes Fascia: No Tendon: No Muscle: No Joint: No Bone: No] [4:Fat Layer (Subcutaneous Tissue) Exposed: Yes Fascia: No Tendon: No Muscle: No Joint: No Bone: No] [5:Fat Layer (Subcutaneous Tissue) Exposed: Yes  Fascia: No Tendon: No Muscle: No Joint: No Bone: No] Epithelialization: [2:Large (67-100%)] [4:Medium (34-66%)] [5:Small (1-33%)] Periwound Skin Texture: [2:Excoriation: No Induration: No Callus: No Crepitus: No] [4:Excoriation: No Induration: No Callus: No Crepitus: No] [5:Excoriation: No Induration: No Callus: No Crepitus: No] Rash: No Rash: No Rash: No Scarring: No Scarring: No Scarring: No Periwound Skin Moisture: Maceration: No Maceration: No Maceration: No Dry/Scaly: No Dry/Scaly: No Dry/Scaly: No Periwound Skin Color: Atrophie Blanche: No Atrophie Blanche: No Atrophie Blanche: No Cyanosis: No Cyanosis: No Cyanosis: No Ecchymosis: No Ecchymosis: No Ecchymosis: No Erythema: No Erythema: No Erythema: No Hemosiderin Staining: No Hemosiderin Staining: No Hemosiderin Staining: No Mottled: No Mottled: No Mottled: No Pallor: No Pallor: No Pallor: No Rubor: No Rubor: No Rubor: No Temperature: N/A No Abnormality No Abnormality Tenderness on Palpation: No No No Wound Preparation: Ulcer Cleansing: Ulcer Cleansing: Ulcer Cleansing: Rinsed/Irrigated with Saline Rinsed/Irrigated with Saline Rinsed/Irrigated with Saline Topical Anesthetic Applied: Topical Anesthetic Applied: Topical Anesthetic Applied: None None None Wound Number: 6 N/A N/A Photos: No Photos N/A N/A Wound Location: Left, Posterior Lower Leg N/A N/A Wounding Event: Gradually Appeared N/A N/A Primary Etiology: Lymphedema N/A N/A Comorbid History: N/A N/A N/A Date Acquired: 03/31/2018 N/A N/A Weeks of Treatment: 3 N/A N/A Wound Status: Healed - Epithelialized N/A N/A Clustered Wound: No N/A N/A Measurements L x W x D 0x0x0 N/A N/A (cm) Area (cm) : 0 N/A  N/A Volume (cm) : 0 N/A N/A % Reduction in Area: 100.00% N/A N/A % Reduction in Volume: 100.00% N/A N/A Classification: Full Thickness Without N/A N/A Exposed Support Structures Exudate Amount: N/A N/A N/A Exudate Type: N/A N/A N/A Exudate Color: N/A N/A N/A Wound Margin: N/A N/A N/A Granulation Amount: N/A N/A N/A Granulation Quality: N/A [name]: N/A N/A N/A Exposed Structures: N/A N/A N/A Epithelialization: N/A N/A N/A Periwound Skin Texture: No Abnormalities Noted N/A N/A Periwound Skin Moisture: No Abnormalities Noted N/A N/A Periwound Skin Color: No Abnormalities Noted N/A N/A Temperature: N/A N/A N/A Tenderness on Palpation: No N/A N/A Wound Preparation: N/A N/A N/A Treatment Notes [name], [name] ([id number]) Electronic Signature(s) Signed: 04/30/2018 11:44:07 AM By: Renne Crigler Entered By: Renne Crigler on 04/29/2018 09:40:23 Vorhees, [name] ([id number]) -------------------------------------------------------------------------------- Multi-Disciplinary Care Plan Details Patient Name: Jessica Casey R. Date of Service: 04/29/2018 8:45 AM Medical Record Number: [id number] Patient Account Number: [id number] Date of Birth/Sex: 17-Jun-1981 (37 y.o. F) Treating RN: Renne Crigler Primary Care Eknoor Novack: PATIENT, NO Other Clinician: Referring Larri Yehle: Referral, Self Treating Stephanieann Popescu/Extender: STONE III, HOYT Weeks in Treatment: 38 Active Inactive ` Orientation  to the Wound Care Program Nursing Diagnoses: Knowledge deficit related to the wound healing center program Goals: Patient/caregiver will verbalize understanding of the Wound Healing Center Program Date Initiated: 09/10/2017 Target Resolution Date: 11/23/2017 Goal Status: Active Interventions: Provide education on orientation to the wound center Notes: ` Venous Leg Ulcer Nursing Diagnoses: Knowledge deficit related to disease process and management Goals: Patient/caregiver will  verbalize understanding of disease process and disease management Date Initiated: 09/10/2017 Target Resolution Date: 11/23/2017 Goal Status: Active Interventions: Assess peripheral edema status every visit. Notes: ` Wound/Skin Impairment Nursing Diagnoses: Impaired tissue integrity Goals: Ulcer/skin breakdown will heal within 14 weeks Date Initiated: 09/10/2017 Target Resolution Date: 11/24/2017 Goal Status: Active Interventions: SAMYRA, LIMB ([id number]) Assess patient/caregiver ability to obtain necessary supplies Assess patient/caregiver ability to perform ulcer/skin care regimen upon admission and as needed Assess ulceration(s) every visit Notes: Electronic Signature(s) Signed: 04/30/2018 11:44:07 AM By: Renne Crigler Entered By: Renne Crigler on 04/29/2018 09:40:06 [name], [name] ([id number]) -------------------------------------------------------------------------------- Pain Assessment Details Patient Name: Jessica Casey R. Date of Service: 04/29/2018 8:45 AM Medical Record Number: [id number] Patient Account Number: [id number] Date of Birth/Sex: October 24, 1981 (36 y.o. F) Treating RN: Curtis Sites Primary Care Darriona Dehaas: PATIENT, NO Other Clinician: Referring Alley Neils: Referral, Self Treating Shyam Dawson/Extender: STONE III, HOYT Weeks in Treatment: 33 Active Problems Location of Pain Severity and Description of Pain Patient Has Paino No Site Locations Pain Management and Medication Current Pain Management: Electronic Signature(s) Signed: 04/29/2018 5:19:07 PM By: Curtis Sites Entered By: Curtis Sites on 04/29/2018 09:01:23 Jessica Hind ([id number]) -------------------------------------------------------------------------------- Patient/Caregiver Education Details Patient Name: Jessica Casey R. Date of Service: 04/29/2018 8:45 AM Medical Record Number: [id number] Patient Account Number: [id number] Date of Birth/Gender: [birth year] (37 y.o. F) Treating RN:  Phillis Haggis Primary Care Physician: PATIENT, NO Other Clinician: Referring Physician: Referral, Self Treating Physician/Extender: Linwood Dibbles, HOYT Weeks in Treatment: 58 Education Assessment Education Provided To: Patient Education Topics Provided Wound/Skin Impairment: Handouts: Caring for Your Ulcer, Skin Care Do's and Dont's, Other: change dressing as ordered Methods: Demonstration, Explain/Verbal Responses: State content correctly Electronic Signature(s) Signed: 04/29/2018 4:38:51 PM By: [name] Entered By: [name] on 04/29/2018 10:23:13 [name], [name] ([id number]) -------------------------------------------------------------------------------- Wound Assessment Details Patient Name: Jessica Casey R. Date of Service: 04/29/2018 8:45 AM Medical Record Number: [id number] Patient Account Number: [id number] Date of Birth/Sex: Apr 04, 1981 (37 y.o. F) Treating RN: Curtis Sites Primary Care Levert Heslop: PATIENT, NO Other Clinician: Referring Amelia Macken: Referral, Self Treating Mykenzie Ebanks/Extender: STONE III, HOYT Weeks in Treatment: 33 Wound Status Wound Number: 2 Primary Etiology: Lymphedema Wound Location: Right Lower Leg - Lateral, Proximal Wound Status: Open Wounding Event: Gradually Appeared Comorbid History: Lymphedema, Hypertension Date Acquired: 08/08/2017 Weeks Of Treatment: 33 Clustered Wound: Yes Photos Photo Uploaded By: Curtis Sites on 04/29/2018 11:43:23 Wound Measurements Length: (cm) 3.6 Width: (cm) 4.2 Depth: (cm) 0.1 Area: (cm) 11.875 Volume: (cm) 1.188 % Reduction in Area: 44.6% % Reduction in Volume: 81.5% Epithelialization: Large (67-100%) Tunneling: No Undermining: No Wound Description Full Thickness With Exposed Support Classification: Structures Exudate Large Amount: Exudate Type: Serous Exudate Color: amber Foul Odor After Cleansing: No Slough/Fibrino Yes Wound Bed Granulation Amount: Large (67-100%) Exposed  Structure Granulation Quality: Red Fascia Exposed: No Necrotic Amount: Small (1-33%) Fat Layer (Subcutaneous Tissue) Exposed: Yes Necrotic Quality: Adherent Slough Tendon Exposed: No Muscle Exposed: No Joint Exposed: No Bone Exposed: No Periwound Skin Texture Wimbush, Larkyn R. ([id number]) Texture Color No Abnormalities Noted: No No Abnormalities Noted: No Callus: No Atrophie Blanche: No Crepitus: No Cyanosis: No Excoriation: No  Ecchymosis: No Induration: No Erythema: No Rash: No Hemosiderin Staining: No Scarring: No Mottled: No Pallor: No Moisture Rubor: No No Abnormalities Noted: No Dry / Scaly: No Maceration: No Wound Preparation Ulcer Cleansing: Rinsed/Irrigated with Saline Topical Anesthetic Applied: None Treatment Notes Wound #2 (Right, Proximal, Lateral Lower Leg) 1. Cleansed with: Clean wound with Normal Saline Cleanse wound with antibacterial soap and water 2. Anesthetic Topical Lidocaine 4% cream to wound bed prior to debridement 3. Peri-wound Care: Moisturizing lotion 4. Dressing Applied: Hydrafera Blue 5. Secondary Dressing Applied ABD Pad 7. Secured with Tape 4 Layer Compression System - Bilateral Notes xtrasorb, unna to Ecologist) Signed: 04/29/2018 5:19:07 PM By: Curtis Sites Entered By: Curtis Sites on 04/29/2018 09:18:14 Noguez, Analyce R. ([id number]) -------------------------------------------------------------------------------- Wound Assessment Details Patient Name: Jessica Casey R. Date of Service: 04/29/2018 8:45 AM Medical Record Number: [id number] Patient Account Number: [id number] Date of Birth/Sex: January 16, 1981 (37 y.o. F) Treating RN: Curtis Sites Primary Care Marquasha Brutus: PATIENT, NO Other Clinician: Referring Chasten Blaze: Referral, Self Treating Dontavia Brand/Extender: STONE III, HOYT Weeks in Treatment: 33 Wound Status Wound Number: 4 Primary Etiology: Lymphedema Wound Location: Right Lower Leg - Lateral,  Distal Wound Status: Open Wounding Event: Gradually Appeared Comorbid History: Lymphedema, Hypertension Date Acquired: 04/07/2018 Weeks Of Treatment: 3 Clustered Wound: No Photos Photo Uploaded By: Curtis Sites on 04/29/2018 11:43:48 Wound Measurements Length: (cm) 0.2 Width: (cm) 0.2 Depth: (cm) 0.1 Area: (cm) 0.031 Volume: (cm) 0.003 % Reduction in Area: 99.7% % Reduction in Volume: 99.7% Epithelialization: Medium (34-66%) Tunneling: No Undermining: No Wound Description Classification: Partial Thickness Wound Margin: Flat and Intact Exudate Amount: Large Exudate Type: Serous Exudate Color: amber Foul Odor After Cleansing: No Slough/Fibrino Yes Wound Bed Granulation Amount: Large (67-100%) Exposed Structure Granulation Quality: Red Fascia Exposed: No Necrotic Amount: Small (1-33%) Fat Layer (Subcutaneous Tissue) Exposed: Yes Necrotic Quality: Adherent Slough Tendon Exposed: No Muscle Exposed: No Joint Exposed: No Bone Exposed: No Periwound Skin Texture [name], [name] ([id number]) Texture Color No Abnormalities Noted: No No Abnormalities Noted: No Callus: No Atrophie Blanche: No Crepitus: No Cyanosis: No Excoriation: No Ecchymosis: No Induration: No Erythema: No Rash: No Hemosiderin Staining: No Scarring: No Mottled: No Pallor: No Moisture Rubor: No No Abnormalities Noted: No Dry / Scaly: No Temperature / Pain Maceration: No Temperature: No Abnormality Wound Preparation Ulcer Cleansing: Rinsed/Irrigated with Saline Topical Anesthetic Applied: None Treatment Notes Wound #4 (Right, Distal, Lateral Lower Leg) 1. Cleansed with: Clean wound with Normal Saline Cleanse wound with antibacterial soap and water 2. Anesthetic Topical Lidocaine 4% cream to wound bed prior to debridement 3. Peri-wound Care: Moisturizing lotion 4. Dressing Applied: Hydrafera Blue 5. Secondary Dressing Applied ABD Pad 7. Secured with Tape 4 Layer Compression  System - Bilateral Notes xtrasorb, unna to Ecologist) Signed: 04/29/2018 5:19:07 PM By: Curtis Sites Entered By: Curtis Sites on 04/29/2018 09:18:30 [name], [name] ([id number]) -------------------------------------------------------------------------------- Wound Assessment Details Patient Name: Jessica Casey R. Date of Service: 04/29/2018 8:45 AM Medical Record Number: [id number] Patient Account Number: [id number] Date of Birth/Sex: [birth year] (37 y.o. F) Treating RN: Curtis Sites Primary Care Guthrie Lemme: PATIENT, NO Other Clinician: Referring Tabetha Haraway: Referral, Self Treating Denetria Luevanos/Extender: STONE III, HOYT Weeks in Treatment: 33 Wound Status Wound Number: 5 Primary Etiology: Lymphedema Wound Location: Left Lower Leg - Anterior Wound Status: Open Wounding Event: Gradually Appeared Comorbid History: Lymphedema, Hypertension Date Acquired: 04/07/2018 Weeks Of Treatment: 3 Clustered Wound: No Photos Photo Uploaded By: Curtis Sites on 04/29/2018 11:43:49 Wound Measurements Length: (cm) 1.9 Width: (cm) 2.4 Depth: (  cm) 0.1 Area: (cm) 3.581 Volume: (cm) 0.358 % Reduction in Area: 53.5% % Reduction in Volume: 90.7% Epithelialization: Small (1-33%) Tunneling: No Undermining: No Wound Description Full Thickness Without Exposed Support Classification: Structures Wound Margin: Distinct, outline attached Exudate Large Amount: Exudate Type: Serous Exudate Color: amber Foul Odor After Cleansing: No Slough/Fibrino Yes Wound Bed Granulation Amount: Large (67-100%) Exposed Structure Granulation Quality: Red Fascia Exposed: No Necrotic Amount: Small (1-33%) Fat Layer (Subcutaneous Tissue) Exposed: Yes Necrotic Quality: Adherent Slough Tendon Exposed: No Muscle Exposed: No Joint Exposed: No Bone Exposed: No [name], [name] ([id number]) Periwound Skin Texture Texture Color No Abnormalities Noted: No No Abnormalities Noted: No Callus:  No Atrophie Blanche: No Crepitus: No Cyanosis: No Excoriation: No Ecchymosis: No Induration: No Erythema: No Rash: No Hemosiderin Staining: No Scarring: No Mottled: No Pallor: No Moisture Rubor: No No Abnormalities Noted: No Dry / Scaly: No Temperature / Pain Maceration: No Temperature: No Abnormality Wound Preparation Ulcer Cleansing: Rinsed/Irrigated with Saline Topical Anesthetic Applied: None Treatment Notes Wound #5 (Left, Anterior Lower Leg) 1. Cleansed with: Clean wound with Normal Saline Cleanse wound with antibacterial soap and water 2. Anesthetic Topical Lidocaine 4% cream to wound bed prior to debridement 3. Peri-wound Care: Moisturizing lotion 4. Dressing Applied: Hydrafera Blue 5. Secondary Dressing Applied ABD Pad 7. Secured with Tape 4 Layer Compression System - Bilateral Notes xtrasorb, unna to Ecologist) Signed: 04/29/2018 5:19:07 PM By: Curtis Sites Entered By: Curtis Sites on 04/29/2018 09:18:44 [name], [name] ([id number]) -------------------------------------------------------------------------------- Wound Assessment Details Patient Name: Jessica Casey R. Date of Service: 04/29/2018 8:45 AM Medical Record Number: [id number] Patient Account Number: [id number] Date of Birth/Sex: 02-24-1981 (37 y.o. F) Treating RN: Curtis Sites Primary Care Elgie Landino: PATIENT, NO Other Clinician: Referring Soyla Bainter: Referral, Self Treating Jasie Meleski/Extender: STONE III, HOYT Weeks in Treatment: 33 Wound Status Wound Number: 6 Primary Etiology: Lymphedema Wound Location: Left, Posterior Lower Leg Wound Status: Healed - Epithelialized Wounding Event: Gradually Appeared Date Acquired: 03/31/2018 Weeks Of Treatment: 3 Clustered Wound: No Photos Photo Uploaded By: Curtis Sites on 04/29/2018 11:44:32 Wound Measurements Length: (cm) 0 Width: (cm) 0 Depth: (cm) 0 Area: (cm) 0 Volume: (cm) 0 % Reduction in Area: 100% % Reduction  in Volume: 100% Wound Description Full Thickness Without Exposed Support Classification: Structures Periwound Skin Texture Texture Color No Abnormalities Noted: No No Abnormalities Noted: No Moisture No Abnormalities Noted: No Electronic Signature(s) Signed: 04/29/2018 5:19:07 PM By: Curtis Sites Entered By: Curtis Sites on 04/29/2018 09:16:07 [name], [name] ([id number]) -------------------------------------------------------------------------------- Vitals Details Patient Name: Jessica Casey R. Date of Service: 04/29/2018 8:45 AM Medical Record Number: [id number] Patient Account Number: [id number] Date of Birth/Sex: Feb 10, 1981 (37 y.o. F) Treating RN: Curtis Sites Primary Care Perri Lamagna: PATIENT, NO Other Clinician: Referring Zaccai Chavarin: Referral, Self Treating Olumide Dolinger/Extender: STONE III, HOYT Weeks in Treatment: 33 Vital Signs Time Taken: 09:01 Temperature (F): 98.3 Height (in): 74 Pulse (bpm): 91 Weight (lbs): 486 Respiratory Rate (breaths/min): 20 Body Mass Index (BMI): 62.4 Blood Pressure (mmHg): 129/86 Reference Range: 80 - 120 mg / dl Electronic Signature(s) Signed: 04/29/2018 5:19:07 PM By: Curtis Sites Entered By: Curtis Sites on 04/29/2018 09:02:26

## 2018-05-03 ENCOUNTER — Encounter (HOSPITAL_COMMUNITY): Payer: Self-pay

## 2018-05-03 ENCOUNTER — Ambulatory Visit (HOSPITAL_COMMUNITY)
Admission: RE | Admit: 2018-05-03 | Discharge: 2018-05-03 | Disposition: A | Discharge status: Home / Self Care | Payer: Self-pay | Source: Ambulatory Visit | Attending: Family | Admitting: Family

## 2018-05-03 DIAGNOSIS — L97221 Non-pressure chronic ulcer of left calf limited to breakdown of skin: Secondary | ICD-10-CM

## 2018-05-03 DIAGNOSIS — L97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d: Secondary | ICD-10-CM | POA: Insufficient documentation

## 2018-05-06 ENCOUNTER — Encounter: Payer: Self-pay | Admitting: Physician Assistant

## 2018-05-06 NOTE — Progress Notes (Signed)
FAYDRA, KORMAN ([id number]) Visit Report for 05/03/2018 Arrival Information Details Patient Name: [name], [name] Date of Service: 05/03/2018 4:00 PM Medical Record Number: [id number] Patient Account Number: [id number] Date of Birth/Sex: 09-09-1981 (37 y.o. F) Treating RN: Renne Crigler Primary Care Samora Jernberg: PATIENT, NO Other Clinician: Referring Vaughn Frieze: Referral, Self Treating Michaelina Blandino/Extender: STONE III, HOYT Weeks in Treatment: 33 Visit Information History Since Last Visit All ordered tests and consults were completed: No Patient Arrived: Ambulatory Added or deleted any medications: No Arrival Time: 16:44 Any new allergies or adverse reactions: No Accompanied By: self Had a fall or experienced change in No Transfer Assistance: None activities of daily living that may affect Patient Identification Verified: Yes risk of falls: Secondary Verification Process Completed: Yes Signs or symptoms of abuse/neglect since last visito No Patient Requires Transmission-Based No Hospitalized since last visit: No Precautions: Implantable device outside of the clinic excluding No Patient Has Alerts: No cellular tissue based products placed in the center since last visit: Pain Present Now: No Electronic Signature(s) Signed: 05/03/2018 4:53:10 PM By: Renne Crigler Entered By: Renne Crigler on 05/03/2018 16:44:33 Chandra, Camelia R. ([id number]) -------------------------------------------------------------------------------- Compression Therapy Details Patient Name: Jessica Casey R. Date of Service: 05/03/2018 4:00 PM Medical Record Number: [id number] Patient Account Number: [id number] Date of Birth/Sex: [birth year] (37 y.o. F) Treating RN: Renne Crigler Primary Care Bernell Haynie: PATIENT, NO Other Clinician: Referring Akeria Hedstrom: Referral, Self Treating Tuwana Kapaun/Extender: STONE III, HOYT Weeks in Treatment: 33 Compression Therapy Performed for Wound Assessment: Wound #2  Right,Proximal,Lateral Lower Leg Performed By: Clinician Renne Crigler, RN Compression Type: Four Layer Pre Treatment ABI: 1.6 Electronic Signature(s) Signed: 05/03/2018 4:53:10 PM By: Renne Crigler Entered By: Renne Crigler on 05/03/2018 16:47:12 Deane, Dorethy R. ([id number]) -------------------------------------------------------------------------------- Compression Therapy Details Patient Name: Jessica Casey R. Date of Service: 05/03/2018 4:00 PM Medical Record Number: [id number] Patient Account Number: [id number] Date of Birth/Sex: 1980-12-23 (37 y.o. F) Treating RN: Renne Crigler Primary Care Savian Mazon: PATIENT, NO Other Clinician: Referring Grisel Blumenstock: Referral, Self Treating Ovid Witman/Extender: STONE III, HOYT Weeks in Treatment: 33 Compression Therapy Performed for Wound Assessment: Wound #4 Right,Distal,Lateral Lower Leg Performed By: Clinician Renne Crigler, RN Compression Type: Four Layer Pre Treatment ABI: 1.6 Electronic Signature(s) Signed: 05/03/2018 4:53:10 PM By: Renne Crigler Entered By: Renne Crigler on 05/03/2018 16:47:12 Olgin, Erinn R. ([id number]) -------------------------------------------------------------------------------- Compression Therapy Details Patient Name: Jessica Casey R. Date of Service: 05/03/2018 4:00 PM Medical Record Number: [id number] Patient Account Number: [id number] Date of Birth/Sex: 03/17/1981 (37 y.o. F) Treating RN: Renne Crigler Primary Care Khalel Alms: PATIENT, NO Other Clinician: Referring Daylyn Christine: Referral, Self Treating Alnisa Hasley/Extender: STONE III, HOYT Weeks in Treatment: 33 Compression Therapy Performed for Wound Assessment: Wound #5 Left,Anterior Lower Leg Performed By: Clinician Renne Crigler, RN Compression Type: Four Layer Pre Treatment ABI: 1.6 Electronic Signature(s) Signed: 05/03/2018 4:53:10 PM By: Renne Crigler Entered By: Renne Crigler on 05/03/2018 16:47:12 Dulay, Madelein R.  ([id number]) -------------------------------------------------------------------------------- Encounter Discharge Information Details Patient Name: Jessica Casey R. Date of Service: 05/03/2018 4:00 PM Medical Record Number: [id number] Patient Account Number: [id number] Date of Birth/Sex: 08-20-1981 (37 y.o. F) Treating RN: Renne Crigler Primary Care Eunice Oldaker: PATIENT, NO Other Clinician: Referring Mykaela Arena: Referral, Self Treating Ekaterini Capitano/Extender: STONE III, HOYT Weeks in Treatment: 61 Encounter Discharge Information Items Discharge Condition: Stable Ambulatory Status: Ambulatory Discharge Destination: Home Transportation: Private Auto Schedule Follow-up Appointment: Yes Clinical Summary of Care: Electronic Signature(s) Signed: 05/03/2018 4:53:10 PM By: Renne Crigler Entered By: Renne Crigler on 05/03/2018 16:48:23 [name] ([id number]) -------------------------------------------------------------------------------- Patient/Caregiver Education Details Patient Name: Jessica Hind.  Date of Service: 05/03/2018 4:00 PM Medical Record Number: [id number] Patient Account Number: [id number] Date of Birth/Gender: 1981-01-06 (37 y.o. F) Treating RN: Renne Crigler Primary Care Physician: PATIENT, NO Other Clinician: Referring Physician: Referral, Self Treating Physician/Extender: Linwood Dibbles, HOYT Weeks in Treatment: 73 Education Assessment Education Provided To: Patient Education Topics Provided Wound/Skin Impairment: Handouts: Caring for Your Ulcer Methods: Explain/Verbal Responses: State content correctly Electronic Signature(s) Signed: 05/03/2018 4:53:10 PM By: Renne Crigler Entered By: Renne Crigler on 05/03/2018 16:48:08 Binford, Earlena R. ([id number]) -------------------------------------------------------------------------------- Wound Assessment Details Patient Name: Jessica Casey R. Date of Service: 05/03/2018 4:00 PM Medical Record Number: [id number] Patient  Account Number: [id number] Date of Birth/Sex: 1980/12/30 (37 y.o. F) Treating RN: Renne Crigler Primary Care Larkyn Greenberger: PATIENT, NO Other Clinician: Referring Andoni Busch: Referral, Self Treating Yovani Cogburn/Extender: STONE III, HOYT Weeks in Treatment: 33 Wound Status Wound Number: 2 Primary Etiology: Lymphedema Wound Location: Right Lower Leg - Lateral, Proximal Wound Status: Open Wounding Event: Gradually Appeared Comorbid History: Lymphedema, Hypertension Date Acquired: 08/08/2017 Weeks Of Treatment: 33 Clustered Wound: Yes Wound Measurements Length: (cm) 3.2 Width: (cm) 4 Depth: (cm) 0.1 Area: (cm) 10.053 Volume: (cm) 1.005 % Reduction in Area: 53.1% % Reduction in Volume: 84.4% Epithelialization: Large (67-100%) Tunneling: No Undermining: No Wound Description Full Thickness With Exposed Support Foul Classification: Structures Sloug Wound Margin: Distinct, outline attached Exudate Large Amount: Exudate Type: Serous Exudate Color: amber Odor After Cleansing: No h/Fibrino Yes Wound Bed Granulation Amount: Large (67-100%) Exposed Structure Granulation Quality: Red Fascia Exposed: No Necrotic Amount: Small (1-33%) Fat Layer (Subcutaneous Tissue) Exposed: Yes Necrotic Quality: Adherent Slough Tendon Exposed: No Muscle Exposed: No Joint Exposed: No Bone Exposed: No Periwound Skin Texture Texture Color No Abnormalities Noted: No No Abnormalities Noted: No Callus: No Atrophie Blanche: No Crepitus: No Cyanosis: No Excoriation: No Ecchymosis: No Induration: No Erythema: No Rash: No Hemosiderin Staining: No Scarring: No Mottled: No Pallor: No Moisture Rubor: No No Abnormalities Noted: No Dry / Scaly: No Platas, Robbye R. ([id number]) Maceration: No Wound Preparation Ulcer Cleansing: Rinsed/Irrigated with Saline Topical Anesthetic Applied: None Electronic Signature(s) Signed: 05/03/2018 4:53:10 PM By: Renne Crigler Entered By: Renne Crigler on  05/03/2018 16:46:00 Done, Kristan R. ([id number]) -------------------------------------------------------------------------------- Wound Assessment Details Patient Name: Jessica Casey R. Date of Service: 05/03/2018 4:00 PM Medical Record Number: [id number] Patient Account Number: [id number] Date of Birth/Sex: Oct 02, 1981 (37 y.o. F) Treating RN: Renne Crigler Primary Care Jylian Pappalardo: PATIENT, NO Other Clinician: Referring Kenyetta Wimbish: Referral, Self Treating Blessed Cotham/Extender: STONE III, HOYT Weeks in Treatment: 33 Wound Status Wound Number: 4 Primary Etiology: Lymphedema Wound Location: Right Lower Leg - Lateral, Distal Wound Status: Open Wounding Event: Gradually Appeared Comorbid History: Lymphedema, Hypertension Date Acquired: 04/07/2018 Weeks Of Treatment: 3 Clustered Wound: No Wound Measurements Length: (cm) 0.2 Width: (cm) 0.2 Depth: (cm) 0.1 Area: (cm) 0.031 Volume: (cm) 0.003 % Reduction in Area: 99.7% % Reduction in Volume: 99.7% Epithelialization: Medium (34-66%) Tunneling: No Undermining: No Wound Description Classification: Partial Thickness Wound Margin: Flat and Intact Exudate Amount: Large Exudate Type: Serous Exudate Color: amber Foul Odor After Cleansing: No Slough/Fibrino Yes Wound Bed Granulation Amount: Large (67-100%) Exposed Structure Granulation Quality: Red Fascia Exposed: No Necrotic Amount: Small (1-33%) Fat Layer (Subcutaneous Tissue) Exposed: Yes Necrotic Quality: Adherent Slough Tendon Exposed: No Muscle Exposed: No Joint Exposed: No Bone Exposed: No Periwound Skin Texture Texture Color No Abnormalities Noted: No No Abnormalities Noted: No Callus: No Atrophie Blanche: No Crepitus: No Cyanosis: No Excoriation: No Ecchymosis: No Induration: No Erythema: No Rash: No Hemosiderin Staining: No  Scarring: No Mottled: No Pallor: No Moisture Rubor: No No Abnormalities Noted: No Dry / Scaly: No Temperature / Pain Maceration:  No Temperature: No Abnormality Baldo, Justyna R. ([id number]) Wound Preparation Ulcer Cleansing: Rinsed/Irrigated with Saline Topical Anesthetic Applied: None Electronic Signature(s) Signed: 05/03/2018 4:53:10 PM By: Renne CriglerFlinchum, Cheryl Entered By: Renne CriglerFlinchum, Cheryl on 05/03/2018 16:46:29 Rajagopalan, Akshara R. ([id number]) -------------------------------------------------------------------------------- Wound Assessment Details Patient Name: Jessica CaseyLEATH, Belem R. Date of Service: 05/03/2018 4:00 PM Medical Record Number: [id number] Patient Account Number: [id number] Date of Birth/Sex: July 03, 1981 (37 y.o. F) Treating RN: Renne CriglerFlinchum, Cheryl Primary Care Emori Kamau: PATIENT, NO Other Clinician: Referring Jaisa Defino: Referral, Self Treating Aolanis Crispen/Extender: STONE III, HOYT Weeks in Treatment: 33 Wound Status Wound Number: 5 Primary Etiology: Lymphedema Wound Location: Left Lower Leg - Anterior Wound Status: Open Wounding Event: Gradually Appeared Comorbid History: Lymphedema, Hypertension Date Acquired: 04/07/2018 Weeks Of Treatment: 3 Clustered Wound: No Wound Measurements Length: (cm) 1.5 Width: (cm) 2 Depth: (cm) 0.1 Area: (cm) 2.356 Volume: (cm) 0.236 % Reduction in Area: 69.4% % Reduction in Volume: 93.9% Epithelialization: Small (1-33%) Tunneling: No Undermining: No Wound Description Full Thickness Without Exposed Support Foul Od Classification: Structures Slough/ Wound Margin: Distinct, outline attached Exudate Large Amount: Exudate Type: Serous Exudate Color: amber or After Cleansing: No Fibrino Yes Wound Bed Granulation Amount: Large (67-100%) Exposed Structure Granulation Quality: Red Fascia Exposed: No Necrotic Amount: Small (1-33%) Fat Layer (Subcutaneous Tissue) Exposed: Yes Necrotic Quality: Adherent Slough Tendon Exposed: No Muscle Exposed: No Joint Exposed: No Bone Exposed: No Periwound Skin Texture Texture Color No Abnormalities Noted: No No Abnormalities Noted:  No Callus: No Atrophie Blanche: No Crepitus: No Cyanosis: No Excoriation: No Ecchymosis: No Induration: No Erythema: No Rash: No Hemosiderin Staining: No Scarring: No Mottled: No Pallor: No Moisture Rubor: No No Abnormalities Noted: No Dry / Scaly: No Temperature / Pain Sandford, Cassey R. ([id number]) Maceration: No Temperature: No Abnormality Wound Preparation Ulcer Cleansing: Rinsed/Irrigated with Saline Topical Anesthetic Applied: None Electronic Signature(s) Signed: 05/03/2018 4:53:10 PM By: Renne CriglerFlinchum, Cheryl Entered By: Renne CriglerFlinchum, Cheryl on 05/03/2018 16:46:42

## 2018-05-08 NOTE — Progress Notes (Signed)
HARLIE, BUENING ([id number]) Visit Report for 05/06/2018 Chief Complaint Document Details Patient Name: [name], [name] Date of Service: 05/06/2018 10:00 AM Medical Record Number: [id number] Patient Account Number: [id number] Date of Birth/Sex: 1980/12/06 (36 y.o. F) Treating RN: Renne Crigler Primary Care Provider: PATIENT, NO Other Clinician: Referring Provider: Referral, Self Treating Provider/Extender: STONE III, HOYT Weeks in Treatment: 34 Information Obtained from: Patient Chief Complaint She is here in follow up evaluation of right lower extremity ulcers Electronic Signature(s) Signed: 05/07/2018 1:56:08 AM By: Lenda Kelp PA-C Entered By: Lenda Kelp on 05/06/2018 10:47:30 [name], [name] ([id number]) -------------------------------------------------------------------------------- Debridement Details Patient Name: Jessica Casey R. Date of Service: 05/06/2018 10:00 AM Medical Record Number: [id number] Patient Account Number: [id number] Date of Birth/Sex: July 25, 1981 (37 y.o. F) Treating RN: Renne Crigler Primary Care Provider: PATIENT, NO Other Clinician: Referring Provider: Referral, Self Treating Provider/Extender: STONE III, HOYT Weeks in Treatment: 34 Debridement Performed for Wound #2 Right,Proximal,Lateral Lower Leg Assessment: Performed By: Physician STONE III, HOYT E., PA-C Debridement Type: Debridement Pre-procedure Verification/Time Yes - 10:51 Out Taken: Start Time: 10:51 Pain Control: Other : lidocaine 4% Total Area Debrided (L x W): 2.5 (cm) x 3 (cm) = 7.5 (cm) Tissue and other material Viable, Non-Viable, Slough, Subcutaneous, Biofilm, Slough debrided: Level: Skin/Subcutaneous Tissue Debridement Description: Excisional Instrument: Curette Bleeding: Minimum Hemostasis Achieved: Pressure End Time: 10:52 Procedural Pain: 0 Post Procedural Pain: 0 Response to Treatment: Procedure was tolerated well Level of Consciousness: Awake and  Alert Post Procedure Vitals: Temperature: 98.2 Pulse: 91 Respiratory Rate: 18 Blood Pressure: Systolic Blood Pressure: 133 Diastolic Blood Pressure: 88 Post Debridement Measurements of Total Wound Length: (cm) 2.5 Width: (cm) 3 Depth: (cm) 0.1 Volume: (cm) 0.589 Character of Wound/Ulcer Post Debridement: Stable Post Procedure Diagnosis Same as Pre-procedure Electronic Signature(s) Signed: 05/06/2018 5:06:21 PM By: Renne Crigler Signed: 05/07/2018 1:56:08 AM By: Lenda Kelp PA-C Entered By: Renne Crigler on 05/06/2018 10:53:03 [name], [name] ([id number]) -------------------------------------------------------------------------------- Debridement Details Patient Name: Jessica Casey R. Date of Service: 05/06/2018 10:00 AM Medical Record Number: [id number] Patient Account Number: [id number] Date of Birth/Sex: 1981/06/15 (37 y.o. F) Treating RN: Renne Crigler Primary Care Provider: PATIENT, NO Other Clinician: Referring Provider: Referral, Self Treating Provider/Extender: STONE III, HOYT Weeks in Treatment: 34 Debridement Performed for Wound #4 Right,Distal,Lateral Lower Leg Assessment: Performed By: Physician STONE III, HOYT E., PA-C Debridement Type: Debridement Pre-procedure Verification/Time Yes - 10:51 Out Taken: Start Time: 10:51 Pain Control: Other : lidocaine 4% Total Area Debrided (L x W): 0.6 (cm) x 0.6 (cm) = 0.36 (cm) Tissue and other material Viable, Non-Viable, Slough, Subcutaneous, Biofilm, Slough debrided: Level: Skin/Subcutaneous Tissue Debridement Description: Excisional Instrument: Curette Bleeding: Minimum Hemostasis Achieved: Pressure End Time: 10:52 Procedural Pain: 0 Post Procedural Pain: 0 Response to Treatment: Procedure was tolerated well Level of Consciousness: Awake and Alert Post Procedure Vitals: Temperature: 98.2 Pulse: 91 Respiratory Rate: 18 Blood Pressure: Systolic Blood Pressure: 133 Diastolic Blood Pressure:  88 Post Debridement Measurements of Total Wound Length: (cm) 0.6 Width: (cm) 0.6 Depth: (cm) 0.2 Volume: (cm) 0.057 Character of Wound/Ulcer Post Debridement: Stable Post Procedure Diagnosis Same as Pre-procedure Electronic Signature(s) Signed: 05/06/2018 5:06:21 PM By: Renne Crigler Signed: 05/07/2018 1:56:08 AM By: Lenda Kelp PA-C Entered By: Renne Crigler on 05/06/2018 10:56:44 [name], [name] ([id number]) -------------------------------------------------------------------------------- HPI Details Patient Name: Jessica Casey R. Date of Service: 05/06/2018 10:00 AM Medical Record Number: [id number] Patient Account Number: [id number] Date of Birth/Sex: 1981/07/02 (37 y.o. F) Treating RN: Renne Crigler Primary Care Provider: PATIENT,  NO Other Clinician: Referring Provider: Referral, Self Treating Provider/Extender: STONE III, HOYT Weeks in Treatment: 34 History of Present Illness HPI Description: 37 year old patient well known to our [facility] wound care clinic where she has been seen since 2016 for bilateral lower extremity venous insufficiency disease with lymphedema and multiple ulcerations associated with morbid obesity. she had custom-made compression stockings and lymphedema pumps which were used in the past. most recently she was admitted to the hospital between October 11 and 09/02/2017 with sepsis, lower extremity wounds and lymphedema.she was initially treated in the outpatient with Keflex and Bactrim. she was initially treated in the hospital with vancomycin and Zosyn and changed over to Unasyn until her white count improved and her blood cultures were negative for 3 days. After her inpatient management she was discharged home on Augmentin to end on 09/13/2017 with a 14 day course. she has had outpatient vascular duplex scans completed in November 2017 and her right ABI was 1.1 and the left ABI is 1.3. she had normal toe brachial indices bilaterally.she had  three-vessel runoff in the right lower extremity and two-vessel runoff in the left lower extremity. On questioning the patient she does have custom made compression stockings and also has a lymphedema pump but has not been using it appropriately and has not been taking good care of herself. 09/17/2017 -- she returns today with compression stockings on the left side and the right side has had significant amount of drainage and has a very strong odor 09/24/2017 -- the drainage is increased significantly and she has more lymphedema and a very strong odor to her wound. Though she does not have systemic symptoms, or overt infection I believe she will benefit from some doxycycline given empirically. 10/01/2017 -- after starting the doxycycline and changing the dressing twice a week her symptoms and signs have definitely improved overall. 10/08/2017 -- she has completed her course of doxycycline but continues to have a lot of drainage and needs twice a week dressing changes. 11/08/17-she is here in follow-up evaluation for right lower extremity ulcers. She admits to using her lymphedema pumps twice daily, one hour per session. she is voicing no complaints or concerns, no signs of infection will change to [facility] 12/14/17 on evaluation today patient appears to be doing very well in regard to her wounds. She has been tolerating the dressing changes she continues to develop some portly the adherent granular tissue on the surface of the wound with some Slough. Obviously we are trying to get too much better wound bed. With that being said the hyper granulation the Hydrofera Blue Dressing to have helped with which is excellent news. However I think it may be time to try something a little bit different at this point. 01/11/18 on evaluation today patient appears to be doing fairly well in regard to her right lateral lower extremity ulcers. This shows excellent signs of filling in which is great news. There does  not appear to be any evidence of infection which is also good news. She does continue to work as well is good school. She is having no pain. 01/22/18 on evaluation today patient appears to be doing a little bit worse in my opinion in regard to the overall quality of that granulation on her right lower extremity. She was not here last week due to being sick with a stomach virus this may have something to do with the fact that her wound appears to be a little bit worse. With that being said I'm also  thinking that after switching from the [facility] Dressing to the silver collagen would really has not looked that's good in my opinion. We may want to swit 02/11/18 on evaluation today patient's right lower/lateral lower extremity ulcers appear to be doing very well at this point. [name], [name] ([id number]) Especially the more proximal ulcer has filled in much closer to surface which is good news. Nonetheless both show signs of improvement which is great news. There does not appear to be any evidence of infection which is also good news. In general patient has been doing well tolerating the wraps as well as the [employer]. 02/18/18 on evaluation today patient appears to be doing a little bit worse in regard to the periwound region the wounds themselves do not look much deteriorated to me. With that being said she has several small blisters/pustules noted in the periwound and there was a significant amount of drainage and maceration compared to previous. There has been a time that we had to bring her back for twice a week dressing changes as far as her wrap was concerned it has been a while since we've done that however. With that being said the patient has been having some burning and in general I'm concerned about the possibility of infection. She has previously taken doxycycline with good result. Fortunately there does not appear to be any evidence of overall worsening in regard to the  size of the wound and in fact the upper wound actually appears to be showing signs of good epithelialization. 03/18/18 on evaluation today patient appears to be doing excellent in regard to her right lateral lower extremity ulcer. She has been tolerating the dressing changes without complication. Fortunately this seems to be making great progress. Overall I see no signs of infection and there is dramatic improvement overall even compared to last week. 03/28/18 on evaluation today patient appears to be doing very well in regard to her right lateral lower surety ulcer. She has been tolerating the dressing changes without complication at this point. She states currently that she's having no significant discomfort which is excellent as well. Overall I'm pleased with how things seem to be progressing. 04/01/18 on evaluation today patient appears to be doing excellent in regard to her right lateral lower extremity ulcers. She has been tolerating the dressing changes without complication which is good news. With that being said the wraps still continues to show signs of helping with her fluid she does have Juxta-Lite compression stockings for when we are done with the current treatment regimen once everything heals. Mainly she just has the one area still remaining the smaller of the two wings is pretty much closed at this point there's just a very slight opening noted. 04/08/18 on evaluation today patient appears to be doing better in regard to the original wound on the right lateral lower extremity that we have been managing. Unfortunately she has a new area of weeping more anterior on the right lateral lower extremity and on the left lower extremity she has two new ulcers there appears to be some cellulitis noted at this time. I am concerned about the fact that this may in fact be an infection that has caused the worsening and swelling in the past this has been the case when we previously attempted to  determine what was going on when she had down slides like this. With that being said the patient is seeming to tolerate the wraps fairly well for the most part. 04/17/18;  since last time the patient was seen in this clinic she was hospitalized from 04/11/18 through 04/13/18; she presented with bilateral lower extremity pain worse on the right and a fever of up to 104. Noteworthy that when she was in the clinic last week she had new wounds on the left leg culture grew MRSA and she was prescribed Bactrim. She had 2 days of IV Vanco and Zosyn in the hospital. Her blood cultures were negative. She was discharged on Bactrim to cover the original MRSA on the right leg and Keflex to cover the possibility of strep. The hospitalist had a conversation with infectious disease. The patient arrives in clinic today for nurse check however given the recent hospitalization I was asked to look at her. The patient states she feels a lot better. No fever or chills. Still some pain in the right calf but a lot better. She arrived in the hospital with a white count of 15.6, the next day was 10.8. Comprehensive metabolic panel was normal. She is still taking Keflex and Bactrim It would appear that she had a surgical IandD at the bedside of the right calf felt to have a underlying abscess. According to our intake nurse the wound has expanded quite bit on the right lateral calf. She has no open area on the left anterior and left posterior calf as described last time 04/23/18 on evaluation today patient actually appears to be doing much better than when I last saw her. This obviously has been a couple weeks ago and in the interim she was admitted to the hospital for IV antibiotic therapy due to cellulitis, discharge, and fortunately the substance which hadn't sued has completely resolved. Her swelling seems to be better in regard to her lower extremities as well and the wounds that opened up as results of the infection seems to  be showing signs of improvement. There is some Slough noted on the left lateral wound otherwise a lot of weeping noted of the right lateral leg. 04/29/18 on evaluation today patient appears to be doing excellent in regard to her bilateral lower extremity ulcers. She's been tolerating the dressing changes without complication there does not appear to be any evidence of infection at this time. Overall I think she is made great improvement and seems to be showing signs of coming back around where she was prior to the cellulitis and sepsis episode. Jessica HindLEATH, [name] ([id number]) 05/06/18 on evaluation today patient appears to be doing better in regard to her bilateral ocean the ulcers. She's been tolerating the dressing changes without complication. Fortunately there does not appear to be any evidence of infection at this time. Her swelling is significantly down overall seems to be showing signs of good improvement as well. Fortunately I do believe that she is progressing nicely back to where she was prior to the cellulitis/sepsis issue. Electronic Signature(s) Signed: 05/07/2018 1:56:08 AM By: Lenda KelpStone III, Hoyt PA-C Entered By: Lenda KelpStone III, Hoyt on 05/06/2018 11:00:29 Jessica HindLEATH, [name] ([id number]) -------------------------------------------------------------------------------- Physical Exam Details Patient Name: Jessica CaseyLEATH, Laela R. Date of Service: 05/06/2018 10:00 AM Medical Record Number: [id number] Patient Account Number: [id number] Date of Birth/Sex: August 03, 1981 (37 y.o. F) Treating RN: Renne CriglerFlinchum, Cheryl Primary Care Provider: PATIENT, NO Other Clinician: Referring Provider: Referral, Self Treating Provider/Extender: STONE III, HOYT Weeks in Treatment: 34 Constitutional Obese and well-hydrated in no acute distress. Respiratory normal breathing without difficulty. Cardiovascular 1+ pitting edema of the bilateral lower extremities. Psychiatric this patient is able to make decisions and demonstrates good  insight into disease process. Alert and Oriented x 3. pleasant and cooperative. Notes At this point on evaluation today patient's wounds did look fairly decent. She did require sharp debridement in regard to the right lateral Lord extremity ulcer. In regard to the left lateral lower surety ulcer this appears to be doing well enough I do not think debridement is necessary at this point. Post debridement the wound bed did appear to be doing better. Still there is some Slough noted that will eventually require additional debridement likely. Electronic Signature(s) Signed: 05/07/2018 1:56:08 AM By: Lenda Kelp PA-C Entered By: Lenda Kelp on 05/06/2018 11:01:24 [name] ([id number]) -------------------------------------------------------------------------------- Physician Orders Details Patient Name: Jessica Casey R. Date of Service: 05/06/2018 10:00 AM Medical Record Number: [id number] Patient Account Number: [id number] Date of Birth/Sex: 1981/03/26 (37 y.o. F) Treating RN: Renne Crigler Primary Care Provider: PATIENT, NO Other Clinician: Referring Provider: Referral, Self Treating Provider/Extender: STONE III, HOYT Weeks in Treatment: 57 Verbal / Phone Orders: No Diagnosis Coding ICD-10 Coding Code Description [phone] 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d L97.221 Non-pressure chronic ulcer of left calf limited to breakdown of skin I89.0 Lymphedema, not elsewhere classified I87.311 Chronic venous hypertension (idiopathic) with ulcer of right lower extremity E66.01 Morbid (severe) obesity due to excess calories Wound Cleansing Wound #2 Right,Proximal,Lateral Lower Leg o Clean wound with Normal Saline. o Cleanse wound with mild soap and water Wound #4 Right,Distal,Lateral Lower Leg o Clean wound with Normal Saline. o Cleanse wound with mild soap and water Wound #5 Left,Anterior Lower Leg o Clean wound with Normal Saline. o Cleanse  wound with mild soap and water Anesthetic (add to Medication List) Wound #2 Right,Proximal,Lateral Lower Leg o Topical Lidocaine 4% cream applied to wound bed prior to debridement (In Clinic Only). Wound #4 Right,Distal,Lateral Lower Leg o Topical Lidocaine 4% cream applied to wound bed prior to debridement (In Clinic Only). Wound #5 Left,Anterior Lower Leg o Topical Lidocaine 4% cream applied to wound bed prior to debridement (In Clinic Only). Skin Barriers/Peri-Wound Care o Moisturizing lotion Primary Wound Dressing Wound #2 Right,Proximal,Lateral Lower Leg o Hydrafera Blue Ready Transfer Wound #4 Right,Distal,Lateral Lower Leg o Hydrafera Blue Ready Transfer Wound #5 Left,Anterior Lower Leg o Hydrafera Blue Ready Transfer [name], [name] ([id number]) Secondary Dressing Wound #2 Right,Proximal,Lateral Lower Leg o ABD pad o XtraSorb Wound #4 Right,Distal,Lateral Lower Leg o ABD pad o XtraSorb Wound #5 Left,Anterior Lower Leg o ABD pad o XtraSorb Dressing Change Frequency Wound #2 Right,Proximal,Lateral Lower Leg o Other: - twice weekly, return for nurse visit friday Wound #4 Right,Distal,Lateral Lower Leg o Other: - twice weekly, return for nurse visit friday Wound #5 Left,Anterior Lower Leg o Other: - twice weekly, return for nurse visit friday Follow-up Appointments Wound #2 Right,Proximal,Lateral Lower Leg o Return Appointment in 1 week. o Nurse Visit as needed o Other: - twice weekly clinic visit, return on friday for nurse visit Wound #4 Right,Distal,Lateral Lower Leg o Return Appointment in 1 week. o Nurse Visit as needed o Other: - twice weekly clinic visit, return on friday for nurse visit Wound #5 Left,Anterior Lower Leg o Nurse Visit as needed o Other: - twice weekly clinic visit, return on friday for nurse visit Edema Control Wound #2 Right,Proximal,Lateral Lower Leg o 4 Layer Compression System - Bilateral  - unna to anchor o Compression Pump: Use compression pump on left lower extremity for 30 minutes, twice daily. - one hour use o Compression Pump: Use compression pump on  right lower extremity for 30 minutes, twice daily. - one hour use Wound #4 Right,Distal,Lateral Lower Leg o 4 Layer Compression System - Bilateral - unna to anchor o Compression Pump: Use compression pump on left lower extremity for 30 minutes, twice daily. - one hour use o Compression Pump: Use compression pump on right lower extremity for 30 minutes, twice daily. - one hour use Wound #5 Left,Anterior Lower Leg o 4 Layer Compression System - Bilateral - unna to anchor o Compression Pump: Use compression pump on left lower extremity for 30 minutes, twice daily. - one hour use o Compression Pump: Use compression pump on right lower extremity for 30 minutes, twice daily. - one hour use [name], [name] ([id number]) Additional Orders / Instructions o Vitamin A; Vitamin C, Zinc o Increase protein intake. Electronic Signature(s) Signed: 05/06/2018 5:06:21 PM By: Renne Crigler Signed: 05/07/2018 1:56:08 AM By: Lenda Kelp PA-C Entered By: Renne Crigler on 05/06/2018 10:59:35 [name], [name] Kitchen ([id number]) -------------------------------------------------------------------------------- Problem List Details Patient Name: Jessica Casey R. Date of Service: 05/06/2018 10:00 AM Medical Record Number: [id number] Patient Account Number: [id number] Date of Birth/Sex: 10-01-1981 (37 y.o. F) Treating RN: Renne Crigler Primary Care Provider: PATIENT, NO Other Clinician: Referring Provider: Referral, Self Treating Provider/Extender: Linwood Dibbles, HOYT Weeks in Treatment: 34 Active Problems ICD-10 Impacting Encounter Code Description Active Date Wound Healing Diagnosis L97.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d 09/10/2017 No Yes L97.221 Non-pressure chronic ulcer of left calf limited  to breakdown of 04/17/2018 No Yes skin I89.0 Lymphedema, not elsewhere classified 09/10/2017 No Yes I87.311 Chronic venous hypertension (idiopathic) with ulcer of right 09/10/2017 No Yes lower extremity E66.01 Morbid (severe) obesity due to excess calories 09/10/2017 No Yes Inactive Problems Resolved Problems Electronic Signature(s) Signed: 05/07/2018 1:56:08 AM By: Lenda Kelp PA-C Entered By: Lenda Kelp on 05/06/2018 10:47:21 Planck, Magdalyn R. ([id number]) -------------------------------------------------------------------------------- Progress Note Details Patient Name: Jessica Casey R. Date of Service: 05/06/2018 10:00 AM Medical Record Number: [id number] Patient Account Number: [id number] Date of Birth/Sex: September 21, 1981 (37 y.o. F) Treating RN: Renne Crigler Primary Care Provider: PATIENT, NO Other Clinician: Referring Provider: Referral, Self Treating Provider/Extender: STONE III, HOYT Weeks in Treatment: 34 Subjective Chief Complaint Information obtained from Patient She is here in follow up evaluation of right lower extremity ulcers History of Present Illness (HPI) 37 year old patient well known to our [facility] wound care clinic where she has been seen since 2016 for bilateral lower extremity venous insufficiency disease with lymphedema and multiple ulcerations associated with morbid obesity. she had custom-made compression stockings and lymphedema pumps which were used in the past. most recently she was admitted to the hospital between October 11 and 09/02/2017 with sepsis, lower extremity wounds and lymphedema.she was initially treated in the outpatient with Keflex and Bactrim. she was initially treated in the hospital with vancomycin and Zosyn and changed over to Unasyn until her white count improved and her blood cultures were negative for 3 days. After her inpatient management she was discharged home on Augmentin to end on 09/13/2017 with a 14 day course. she has  had outpatient vascular duplex scans completed in November 2017 and her right ABI was 1.1 and the left ABI is 1.3. she had normal toe brachial indices bilaterally.she had three-vessel runoff in the right lower extremity and two-vessel runoff in the left lower extremity. On questioning the patient she does have custom made compression stockings and also has a lymphedema pump but has not been using it appropriately and has  not been taking good care of herself. 09/17/2017 -- she returns today with compression stockings on the left side and the right side has had significant amount of drainage and has a very strong odor 09/24/2017 -- the drainage is increased significantly and she has more lymphedema and a very strong odor to her wound. Though she does not have systemic symptoms, or overt infection I believe she will benefit from some doxycycline given empirically. 10/01/2017 -- after starting the doxycycline and changing the dressing twice a week her symptoms and signs have definitely improved overall. 10/08/2017 -- she has completed her course of doxycycline but continues to have a lot of drainage and needs twice a week dressing changes. 11/08/17-she is here in follow-up evaluation for right lower extremity ulcers. She admits to using her lymphedema pumps twice daily, one hour per session. she is voicing no complaints or concerns, no signs of infection will change to [facility] 12/14/17 on evaluation today patient appears to be doing very well in regard to her wounds. She has been tolerating the dressing changes she continues to develop some portly the adherent granular tissue on the surface of the wound with some Slough. Obviously we are trying to get too much better wound bed. With that being said the hyper granulation the Hydrofera Blue Dressing to have helped with which is excellent news. However I think it may be time to try something a little bit different at this point. 01/11/18 on evaluation  today patient appears to be doing fairly well in regard to her right lateral lower extremity ulcers. This shows excellent signs of filling in which is great news. There does not appear to be any evidence of infection which is also good news. She does continue to work as well is good school. She is having no pain. 01/22/18 on evaluation today patient appears to be doing a little bit worse in my opinion in regard to the overall quality of that Caldeira, Dorinda R. ([id number]) granulation on her right lower extremity. She was not here last week due to being sick with a stomach virus this may have something to do with the fact that her wound appears to be a little bit worse. With that being said I'm also thinking that after switching from the [facility] Dressing to the silver collagen would really has not looked that's good in my opinion. We may want to swit 02/11/18 on evaluation today patient's right lower/lateral lower extremity ulcers appear to be doing very well at this point. Especially the more proximal ulcer has filled in much closer to surface which is good news. Nonetheless both show signs of improvement which is great news. There does not appear to be any evidence of infection which is also good news. In general patient has been doing well tolerating the wraps as well as the [employer]. 02/18/18 on evaluation today patient appears to be doing a little bit worse in regard to the periwound region the wounds themselves do not look much deteriorated to me. With that being said she has several small blisters/pustules noted in the periwound and there was a significant amount of drainage and maceration compared to previous. There has been a time that we had to bring her back for twice a week dressing changes as far as her wrap was concerned it has been a while since we've done that however. With that being said the patient has been having some burning and in general I'm concerned about the  possibility of infection.  She has previously taken doxycycline with good result. Fortunately there does not appear to be any evidence of overall worsening in regard to the size of the wound and in fact the upper wound actually appears to be showing signs of good epithelialization. 03/18/18 on evaluation today patient appears to be doing excellent in regard to her right lateral lower extremity ulcer. She has been tolerating the dressing changes without complication. Fortunately this seems to be making great progress. Overall I see no signs of infection and there is dramatic improvement overall even compared to last week. 03/28/18 on evaluation today patient appears to be doing very well in regard to her right lateral lower surety ulcer. She has been tolerating the dressing changes without complication at this point. She states currently that she's having no significant discomfort which is excellent as well. Overall I'm pleased with how things seem to be progressing. 04/01/18 on evaluation today patient appears to be doing excellent in regard to her right lateral lower extremity ulcers. She has been tolerating the dressing changes without complication which is good news. With that being said the wraps still continues to show signs of helping with her fluid she does have Juxta-Lite compression stockings for when we are done with the current treatment regimen once everything heals. Mainly she just has the one area still remaining the smaller of the two wings is pretty much closed at this point there's just a very slight opening noted. 04/08/18 on evaluation today patient appears to be doing better in regard to the original wound on the right lateral lower extremity that we have been managing. Unfortunately she has a new area of weeping more anterior on the right lateral lower extremity and on the left lower extremity she has two new ulcers there appears to be some cellulitis noted at this time. I  am concerned about the fact that this may in fact be an infection that has caused the worsening and swelling in the past this has been the case when we previously attempted to determine what was going on when she had down slides like this. With that being said the patient is seeming to tolerate the wraps fairly well for the most part. 04/17/18; since last time the patient was seen in this clinic she was hospitalized from 04/11/18 through 04/13/18; she presented with bilateral lower extremity pain worse on the right and a fever of up to 104. Noteworthy that when she was in the clinic last week she had new wounds on the left leg culture grew MRSA and she was prescribed Bactrim. She had 2 days of IV Vanco and Zosyn in the hospital. Her blood cultures were negative. She was discharged on Bactrim to cover the original MRSA on the right leg and Keflex to cover the possibility of strep. The hospitalist had a conversation with infectious disease. The patient arrives in clinic today for nurse check however given the recent hospitalization I was asked to look at her. The patient states she feels a lot better. No fever or chills. Still some pain in the right calf but a lot better. She arrived in the hospital with a white count of 15.6, the next day was 10.8. Comprehensive metabolic panel was normal. She is still taking Keflex and Bactrim It would appear that she had a surgical IandD at the bedside of the right calf felt to have a underlying abscess. According to our intake nurse the wound has expanded quite bit on the right lateral calf. She has no  open area on the left anterior and left posterior calf as described last time 04/23/18 on evaluation today patient actually appears to be doing much better than when I last saw her. This obviously has been a couple weeks ago and in the interim she was admitted to the hospital for IV antibiotic therapy due to cellulitis, discharge, and fortunately the substance which  hadn't sued has completely resolved. Her swelling seems to be better in regard to her lower extremities as well and the wounds that opened up as results of the infection seems to be showing signs of improvement. There is some Slough noted on the left lateral wound otherwise a lot of weeping noted of the right lateral leg. BAYLIE, DRAKES ([id number]) 04/29/18 on evaluation today patient appears to be doing excellent in regard to her bilateral lower extremity ulcers. She's been tolerating the dressing changes without complication there does not appear to be any evidence of infection at this time. Overall I think she is made great improvement and seems to be showing signs of coming back around where she was prior to the cellulitis and sepsis episode. 05/06/18 on evaluation today patient appears to be doing better in regard to her bilateral ocean the ulcers. She's been tolerating the dressing changes without complication. Fortunately there does not appear to be any evidence of infection at this time. Her swelling is significantly down overall seems to be showing signs of good improvement as well. Fortunately I do believe that she is progressing nicely back to where she was prior to the cellulitis/sepsis issue. Patient History Information obtained from Patient. Family History Kidney Disease - Mother, No family history of Cancer, Diabetes, Heart Disease, Hereditary Spherocytosis, Hypertension, Lung Disease, Seizures, Stroke, Thyroid Problems, Tuberculosis. Social History Never smoker, Marital Status - Married, Alcohol Use - Never, Drug Use - No History, Caffeine Use - Daily. Review of Systems (ROS) Constitutional Symptoms (General Health) Denies complaints or symptoms of Fever, Chills. Respiratory The patient has no complaints or symptoms. Cardiovascular Complains or has symptoms of LE edema. Psychiatric The patient has no complaints or symptoms. Objective Constitutional Obese and  well-hydrated in no acute distress. Vitals Time Taken: 10:13 AM, Height: 74 in, Weight: 486 lbs, BMI: 62.4, Temperature: 98.1 F, Pulse: 91 bpm, Respiratory Rate: 18 breaths/min, Blood Pressure: 133/88 mmHg. Respiratory normal breathing without difficulty. Cardiovascular 1+ pitting edema of the bilateral lower extremities. Psychiatric this patient is able to make decisions and demonstrates good insight into disease process. Alert and Oriented x 3. pleasant and cooperative. [name], DIMON [name] Kitchen ([id number]) General Notes: At this point on evaluation today patient's wounds did look fairly decent. She did require sharp debridement in regard to the right lateral Lord extremity ulcer. In regard to the left lateral lower surety ulcer this appears to be doing well enough I do not think debridement is necessary at this point. Post debridement the wound bed did appear to be doing better. Still there is some Slough noted that will eventually require additional debridement likely. Integumentary (Hair, Skin) Wound #2 status is Open. Original cause of wound was Gradually Appeared. The wound is located on the Right,Proximal,Lateral Lower Leg. The wound measures 2.5cm length x 3cm width x 0.1cm depth; 5.89cm^2 area and 0.589cm^3 volume. There is Fat Layer (Subcutaneous Tissue) Exposed exposed. There is no tunneling or undermining noted. There is a small amount of serous drainage noted. The wound margin is distinct with the outline attached to the wound base. There is large (67-100%) red granulation within the wound  bed. There is a small (1-33%) amount of necrotic tissue within the wound bed including Adherent Slough. The periwound skin appearance did not exhibit: Callus, Crepitus, Excoriation, Induration, Rash, Scarring, Dry/Scaly, Maceration, Atrophie Blanche, Cyanosis, Ecchymosis, Hemosiderin Staining, Mottled, Pallor, Rubor, Erythema. Wound #4 status is Open. Original cause of wound was Gradually Appeared.  The wound is located on the Right,Distal,Lateral Lower Leg. The wound measures 0.6cm length x 0.6cm width x 0.1cm depth; 0.283cm^2 area and 0.028cm^3 volume. There is Fat Layer (Subcutaneous Tissue) Exposed exposed. There is no tunneling or undermining noted. There is a large amount of serous drainage noted. The wound margin is flat and intact. There is large (67-100%) red granulation within the wound bed. There is a small (1-33%) amount of necrotic tissue within the wound bed including Adherent Slough. The periwound skin appearance did not exhibit: Callus, Crepitus, Excoriation, Induration, Rash, Scarring, Dry/Scaly, Maceration, Atrophie Blanche, Cyanosis, Ecchymosis, Hemosiderin Staining, Mottled, Pallor, Rubor, Erythema. Periwound temperature was noted as No Abnormality. Wound #5 status is Open. Original cause of wound was Gradually Appeared. The wound is located on the Left,Anterior Lower Leg. The wound measures 2.3cm length x 2.1cm width x 0.1cm depth; 3.793cm^2 area and 0.379cm^3 volume. There is Fat Layer (Subcutaneous Tissue) Exposed exposed. There is no tunneling or undermining noted. There is a small amount of serous drainage noted. The wound margin is distinct with the outline attached to the wound base. There is large (67-100%) red granulation within the wound bed. There is no necrotic tissue within the wound bed. The periwound skin appearance did not exhibit: Callus, Crepitus, Excoriation, Induration, Rash, Scarring, Dry/Scaly, Maceration, Atrophie Blanche, Cyanosis, Ecchymosis, Hemosiderin Staining, Mottled, Pallor, Rubor, Erythema. Periwound temperature was noted as No Abnormality. 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 There was a Excisional Skin/Subcutaneous Tissue Debridement with a total area of 7.5 sq cm performed by STONE III, HOYT E., PA-C. Min, Jiya R. ([id number]) With the following instrument(s): Curette to remove Viable and Non-Viable tissue/material. Material removed includes Subcutaneous Tissue, Slough, and Biofilm after achieving pain control using Other (lidocaine 4%). No specimens were taken. A time out was conducted at 10:51, prior to the start of the procedure. A Minimum amount of bleeding was controlled with Pressure. The procedure was tolerated well with a pain level of 0 throughout and a pain level of 0 following the procedure. Patient s Level of Consciousness post procedure was recorded as Awake and Alert. Post Debridement Measurements: 2.5cm length x 3cm width x 0.1cm depth; 0.589cm^3 volume. Character of Wound/Ulcer Post Debridement is stable. Post procedure Diagnosis Wound #2: Same as Pre-Procedure Wound #4 Pre-procedure diagnosis of Wound #4 is a Lymphedema located on the Right,Distal,Lateral Lower Leg . There was a Excisional Skin/Subcutaneous Tissue Debridement with a total area of 0.36 sq cm performed by STONE III, HOYT E., PA-C. With the following instrument(s): Curette to remove Viable and Non-Viable tissue/material. Material removed includes Subcutaneous Tissue, Slough, and Biofilm after achieving pain control using Other (lidocaine 4%). No specimens were taken. A time out was conducted at 10:51, prior to the start of the procedure. A Minimum amount of bleeding was controlled with Pressure. The procedure was tolerated well with  a pain level of 0 throughout and a pain level of 0 following the procedure. Patient s Level of Consciousness post procedure was recorded as Awake and Alert. Post Debridement Measurements: 0.6cm length x  0.6cm width x 0.2cm depth; 0.057cm^3 volume. Character of Wound/Ulcer Post Debridement is stable. Post procedure Diagnosis Wound #4: Same as Pre-Procedure Plan Wound Cleansing: Wound #2 Right,Proximal,Lateral Lower Leg: Clean wound with Normal Saline. Cleanse wound with mild soap and water Wound #4 Right,Distal,Lateral Lower Leg: Clean wound with Normal Saline. Cleanse wound with mild soap and water Wound #5 Left,Anterior Lower Leg: Clean wound with Normal Saline. Cleanse wound with mild soap and water Anesthetic (add to Medication List): Wound #2 Right,Proximal,Lateral Lower Leg: Topical Lidocaine 4% cream applied to wound bed prior to debridement (In Clinic Only). Wound #4 Right,Distal,Lateral Lower Leg: Topical Lidocaine 4% cream applied to wound bed prior to debridement (In Clinic Only). Wound #5 Left,Anterior Lower Leg: Topical Lidocaine 4% cream applied to wound bed prior to debridement (In Clinic Only). Skin Barriers/Peri-Wound Care: Moisturizing lotion Primary Wound Dressing: Wound #2 Right,Proximal,Lateral Lower Leg: Hydrafera Blue Ready Transfer Wound #4 Right,Distal,Lateral Lower Leg: Hydrafera Blue Ready Transfer Wound #5 Left,Anterior Lower Leg: Hydrafera Blue Ready Transfer Secondary Dressing: Wound #2 Right,Proximal,Lateral Lower Leg: ABD pad Takia Runyon, Vivianne R. ([id number]) Wound #4 Right,Distal,Lateral Lower Leg: ABD pad XtraSorb Wound #5 Left,Anterior Lower Leg: ABD pad XtraSorb Dressing Change Frequency: Wound #2 Right,Proximal,Lateral Lower Leg: Other: - twice weekly, return for nurse visit friday Wound #4 Right,Distal,Lateral Lower Leg: Other: - twice weekly, return for nurse visit friday Wound #5 Left,Anterior Lower Leg: Other: - twice weekly, return for nurse visit friday Follow-up Appointments: Wound #2 Right,Proximal,Lateral Lower Leg: Return Appointment in 1 week. Nurse Visit as needed Other: - twice weekly clinic visit, return on  friday for nurse visit Wound #4 Right,Distal,Lateral Lower Leg: Return Appointment in 1 week. Nurse Visit as needed Other: - twice weekly clinic visit, return on friday for nurse visit Wound #5 Left,Anterior Lower Leg: Nurse Visit as needed Other: - twice weekly clinic visit, return on friday for nurse visit Edema Control: Wound #2 Right,Proximal,Lateral Lower Leg: 4 Layer Compression System - Bilateral - unna to anchor Compression Pump: Use compression pump on left lower extremity for 30 minutes, twice daily. - one hour use Compression Pump: Use compression pump on right lower extremity for 30 minutes, twice daily. - one hour use Wound #4 Right,Distal,Lateral Lower Leg: 4 Layer Compression System - Bilateral - unna to anchor Compression Pump: Use compression pump on left lower extremity for 30 minutes, twice daily. - one hour use Compression Pump: Use compression pump on right lower extremity for 30 minutes, twice daily. - one hour use Wound #5 Left,Anterior Lower Leg: 4 Layer Compression System - Bilateral - unna to anchor Compression Pump: Use compression pump on left lower extremity for 30 minutes, twice daily. - one hour use Compression Pump: Use compression pump on right lower extremity for 30 minutes, twice daily. - one hour use Additional Orders / Instructions: Vitamin A; Vitamin C, Zinc Increase protein intake. I am going to recommend that we continue with the Current wound care measures for the next week. She's in agreement with plan. Will subtly see were things stand in one weeks time with your back for reevaluation. Please see above for specific wound care orders. We will see patient for re-evaluation in 1 week(s) here in the clinic. If anything worsens or changes patient will contact our office for additional recommendations. Electronic Signature(s)  Signed: 05/07/2018 1:56:08 AM By: Lenda Kelp PA-C Entered By: Lenda Kelp on 05/06/2018 11:01:53 [name]  ([id number]) -------------------------------------------------------------------------------- ROS/PFSH Details Patient Name: Jessica Casey R. Date of Service: 05/06/2018 10:00 AM Medical Record Number: [id number] Patient Account Number: [id number] Date of Birth/Sex: September 27, 1981 (37 y.o. F) Treating RN: Renne Crigler Primary Care Provider: PATIENT, NO Other Clinician: Referring Provider: Referral, Self Treating Provider/Extender: STONE III, HOYT Weeks in Treatment: 34 Information Obtained From Patient Wound History Do you currently have one or more open woundso Yes How many open wounds do you currently haveo 1 Approximately how long have you had your woundso 1 month How have you been treating your wound(s) until nowo aquacel ag Has your wound(s) ever healed and then re-openedo No Have you had any lab work done in the past montho No Have you tested positive for an antibiotic resistant organism (MRSA, VRE)o No Have you tested positive for osteomyelitis (bone infection)o No Have you had any tests for circulation on your legso Yes Who ordered the testo [facility] Where was the test doneo [facility] Constitutional Symptoms (General Health) Complaints and Symptoms: Negative for: Fever; Chills Cardiovascular Complaints and Symptoms: Positive for: LE edema Medical History: Positive for: Hypertension Negative for: Angina; Arrhythmia; Congestive Heart Failure; Coronary Artery Disease; Deep Vein Thrombosis; Hypotension; Myocardial Infarction; Peripheral Arterial Disease; Peripheral Venous Disease; Phlebitis; Vasculitis Hematologic/Lymphatic Medical History: Positive for: Lymphedema Respiratory Complaints and Symptoms: No Complaints or Symptoms Medical History: Negative for: Aspiration; Asthma; Chronic Obstructive Pulmonary Disease (COPD); Pneumothorax; Sleep Apnea; Tuberculosis Psychiatric Complaints and Symptoms: No Complaints or Symptoms Williamson, Kyrsten R.  ([id number]) Immunizations Pneumococcal Vaccine: Received Pneumococcal Vaccination: No Immunization Notes: up to date Implantable Devices Family and Social History Cancer: No; Diabetes: No; Heart Disease: No; Hereditary Spherocytosis: No; Hypertension: No; Kidney Disease: Yes - Mother; Lung Disease: No; Seizures: No; Stroke: No; Thyroid Problems: No; Tuberculosis: No; Never smoker; Marital Status - Married; Alcohol Use: Never; Drug Use: No History; Caffeine Use: Daily; Financial Concerns: No; Food, Clothing or Shelter Needs: No; Support System Lacking: No; Transportation Concerns: No; Advanced Directives: No; Patient does not want information on Advanced Directives Physician Affirmation I have reviewed and agree with the above information. Electronic Signature(s) Signed: 05/06/2018 5:06:21 PM By: Renne Crigler Signed: 05/07/2018 1:56:08 AM By: Lenda Kelp PA-C Entered By: Lenda Kelp on 05/06/2018 11:00:49 Loring, Sanita [name] Kitchen ([id number]) -------------------------------------------------------------------------------- SuperBill Details Patient Name: Jessica Casey R. Date of Service: 05/06/2018 Medical Record Number: [id number] Patient Account Number: [id number] Date of Birth/Sex: 11/19/1981 (37 y.o. F) Treating RN: Renne Crigler Primary Care Provider: PATIENT, NO Other Clinician: Referring Provider: Referral, Self Treating Provider/Extender: STONE III, HOYT Weeks in Treatment: 34 Diagnosis Coding ICD-10 Codes Code Description [phone] 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d L97.221 Non-pressure chronic ulcer of left calf limited to breakdown of skin I89.0 Lymphedema, not elsewhere classified I87.311 Chronic venous hypertension (idiopathic) with ulcer of right lower extremity E66.01 Morbid (severe) obesity due to excess calories Facility Procedures CPT4 Code: [id number] Description: 11042 - DEB SUBQ TISSUE 20 SQ CM/< ICD-10 Diagnosis Description L97.212  Non-pressure chronic ulcer of right calf with fat layer expo Modifier: sed Quantity: 1 Physician Procedures CPT4 Code: 2951884 Description: 11042 - WC PHYS SUBQ TISS 20 SQ CM ICD-10 Diagnosis Description L97.212 Non-pressure chronic ulcer of right calf with fat layer expo Modifier: sed Quantity: 1 Electronic Signature(s) Signed: 05/07/2018 1:56:08 AM By: Lenda Kelp PA-C Entered By: Lenda Kelp on 05/06/2018 11:02:10

## 2018-05-13 ENCOUNTER — Encounter: Payer: Self-pay | Admitting: Physician Assistant

## 2018-05-14 NOTE — Progress Notes (Signed)
CATALYNA, REILLY ([id number]) Visit Report for 05/13/2018 Arrival Information Details Patient Name: [name], [name] Date of Service: 05/13/2018 11:15 AM Medical Record Number: [id number] Patient Account Number: [id number] Date of Birth/Sex: 03/23/1981 (37 y.o. F) Treating RN: Curtis Sites Primary Care Shammara Jarrett: PATIENT, NO Other Clinician: Referring Lucretia Pendley: Referral, Self Treating Krishika Bugge/Extender: STONE III, HOYT Weeks in Treatment: 35 Visit Information History Since Last Visit Added or deleted any medications: No Patient Arrived: Ambulatory Any new allergies or adverse reactions: No Arrival Time: 11:25 Had a fall or experienced change in No Accompanied By: self activities of daily living that may affect Transfer Assistance: None risk of falls: Patient Identification Verified: Yes Signs or symptoms of abuse/neglect since last visito No Secondary Verification Process Completed: Yes Hospitalized since last visit: No Patient Requires Transmission-Based No Implantable device outside of the clinic excluding No Precautions: cellular tissue based products placed in the center Patient Has Alerts: No since last visit: Has Dressing in Place as Prescribed: Yes Has Compression in Place as Prescribed: Yes Pain Present Now: No Electronic Signature(s) Signed: 05/13/2018 4:53:29 PM By: Curtis Sites Entered By: Curtis Sites on 05/13/2018 11:26:05 Brandvold, [name] ([id number]) -------------------------------------------------------------------------------- Clinic Level of Care Assessment Details Patient Name: Jessica Casey R. Date of Service: 05/13/2018 11:15 AM Medical Record Number: [id number] Patient Account Number: [id number] Date of Birth/Sex: 1981-08-07 (37 y.o. F) Treating RN: Renne Crigler Primary Care Lenzy Kerschner: PATIENT, NO Other Clinician: Referring Layce Sprung: Referral, Self Treating Dalores Weger/Extender: STONE III, HOYT Weeks in Treatment: 35 Clinic Level of Care Assessment  Items TOOL 4 Quantity Score X - Use when only an EandM is performed on FOLLOW-UP visit 1 0 ASSESSMENTS - Nursing Assessment / Reassessment X - Reassessment of Co-morbidities (includes updates in patient status) 1 10 X- 1 5 Reassessment of Adherence to Treatment Plan ASSESSMENTS - Wound and Skin Assessment / Reassessment []  - Simple Wound Assessment / Reassessment - one wound 0 X- 3 5 Complex Wound Assessment / Reassessment - multiple wounds []  - 0 Dermatologic / Skin Assessment (not related to wound area) ASSESSMENTS - Focused Assessment []  - Circumferential Edema Measurements - multi extremities 0 []  - 0 Nutritional Assessment / Counseling / Intervention []  - 0 Lower Extremity Assessment (monofilament, tuning fork, pulses) []  - 0 Peripheral Arterial Disease Assessment (using hand held doppler) ASSESSMENTS - Ostomy and/or Continence Assessment and Care []  - Incontinence Assessment and Management 0 []  - 0 Ostomy Care Assessment and Management (repouching, etc.) PROCESS - Coordination of Care []  - Simple Patient / Family Education for ongoing care 0 X- 1 20 Complex (extensive) Patient / Family Education for ongoing care []  - 0 Staff obtains Chiropractor, Records, Test Results / Process Orders []  - 0 Staff telephones HHA, Nursing Homes / Clarify orders / etc []  - 0 Routine Transfer to another Facility (non-emergent condition) []  - 0 Routine Hospital Admission (non-emergent condition) []  - 0 New Admissions / Manufacturing engineer / Ordering NPWT, Apligraf, etc. []  - 0 Emergency Hospital Admission (emergent condition) []  - 0 Simple Discharge Coordination Bendickson, [name] ([id number]) X- 1 15 Complex (extensive) Discharge Coordination PROCESS - Special Needs []  - Pediatric / Minor Patient Management 0 []  - 0 Isolation Patient Management []  - 0 Hearing / Language / Visual special needs []  - 0 Assessment of Community assistance (transportation, D/C planning, etc.) []  -  0 Additional assistance / Altered mentation []  - 0 Support Surface(s) Assessment (bed, cushion, seat, etc.) INTERVENTIONS - Wound Cleansing / Measurement []  - Simple Wound Cleansing - one wound 0  X- 3 5 Complex Wound Cleansing - multiple wounds []  - 0 Wound Imaging (photographs - any number of wounds) X- 1 5 Wound Tracing (instead of photographs) []  - 0 Simple Wound Measurement - one wound X- 3 5 Complex Wound Measurement - multiple wounds INTERVENTIONS - Wound Dressings []  - Small Wound Dressing one or multiple wounds 0 X- 3 15 Medium Wound Dressing one or multiple wounds []  - 0 Large Wound Dressing one or multiple wounds []  - 0 Application of Medications - topical []  - 0 Application of Medications - injection INTERVENTIONS - Miscellaneous []  - External ear exam 0 []  - 0 Specimen Collection (cultures, biopsies, blood, body fluids, etc.) []  - 0 Specimen(s) / Culture(s) sent or taken to Lab for analysis []  - 0 Patient Transfer (multiple staff / Nurse, adult / Similar devices) []  - 0 Simple Staple / Suture removal (25 or less) []  - 0 Complex Staple / Suture removal (26 or more) []  - 0 Hypo / Hyperglycemic Management (close monitor of Blood Glucose) []  - 0 Ankle / Brachial Index (ABI) - do not check if billed separately X- 1 5 Vital Signs Loveridge, [name] ([id number]) Has the patient been seen at the hospital within the last three years: Yes Total Score: 150 Level Of Care: New/Established - Level 4 Electronic Signature(s) Signed: 05/13/2018 4:53:36 PM By: Renne Crigler Entered By: Renne Crigler on 05/13/2018 11:58:53 Laubacher, [name] ([id number]) -------------------------------------------------------------------------------- Encounter Discharge Information Details Patient Name: Jessica Casey R. Date of Service: 05/13/2018 11:15 AM Medical Record Number: [id number] Patient Account Number: [id number] Date of Birth/Sex: 05/27/1981 (37 y.o. F) Treating RN: Renne Crigler Primary Care Alicianna Litchford: PATIENT, NO Other Clinician: Referring Trek Kimball: Referral, Self Treating Rithwik Schmieg/Extender: STONE III, HOYT Weeks in Treatment: 35 Encounter Discharge Information Items Discharge Condition: Stable Ambulatory Status: Ambulatory Discharge Destination: Home Transportation: Private Auto Schedule Follow-up Appointment: Yes Clinical Summary of Care: Electronic Signature(s) Signed: 05/13/2018 4:53:36 PM By: Renne Crigler Entered By: Renne Crigler on 05/13/2018 12:00:13 [place]oteau, [name] ([id number]) -------------------------------------------------------------------------------- Lower Extremity Assessment Details Patient Name: Jessica Casey R. Date of Service: 05/13/2018 11:15 AM Medical Record Number: [id number] Patient Account Number: [id number] Date of Birth/Sex: 07-26-1981 (37 y.o. F) Treating RN: Curtis Sites Primary Care Diella Gillingham: PATIENT, NO Other Clinician: Referring Trenia Tennyson: Referral, Self Treating Dimitriy Carreras/Extender: STONE III, HOYT Weeks in Treatment: 35 Edema Assessment Assessed: [Left: No] [Right: No] [Left: Edema] [Right: :] Calf Left: Right: Point of Measurement: 36 cm From Medial Instep 58.3 cm 57.9 cm Ankle Left: Right: Point of Measurement: 9 cm From Medial Instep 36.3 cm 36.5 cm Vascular Assessment Pulses: Dorsalis Pedis Palpable: [Left:Yes] [Right:Yes] Posterior Tibial Extremity colors, hair growth, and conditions: Extremity Color: [Left:Hyperpigmented] [Right:Hyperpigmented] Hair Growth on Extremity: [Left:Yes] [Right:Yes] Temperature of Extremity: [Left:Warm] [Right:Warm] Capillary Refill: [Left:< 3 seconds] [Right:< 3 seconds] Toe Nail Assessment Left: Right: Thick: Yes Yes Discolored: Yes Yes Deformed: Yes Yes Improper Length and Hygiene: Yes Yes Electronic Signature(s) Signed: 05/13/2018 4:53:29 PM By: Curtis Sites Entered By: Curtis Sites on 05/13/2018 11:36:35 Lenker, [name]  ([id number]) -------------------------------------------------------------------------------- Multi Wound Chart Details Patient Name: Jessica Casey R. Date of Service: 05/13/2018 11:15 AM Medical Record Number: [id number] Patient Account Number: [id number] Date of Birth/Sex: 1980-12-19 (37 y.o. F) Treating RN: Renne Crigler Primary Care Saifullah Jolley: PATIENT, NO Other Clinician: Referring Nemiah Bubar: Referral, Self Treating Kennieth Plotts/Extender: STONE III, HOYT Weeks in Treatment: 35 Vital Signs Height(in): 74 Pulse(bpm): 97 Weight(lbs): 486 Blood Pressure(mmHg): 148/80 Body Mass Index(BMI): 62 Temperature(F): 97.6 Respiratory Rate 18 (breaths/min): Photos: [2:No Photos] [4:No  Photos] [5:No Photos] Wound Location: [2:Right Lower Leg - Lateral, Proximal] [4:Right, Distal, Lateral Lower Leg] [5:Left, Anterior Lower Leg] Wounding Event: [2:Gradually Appeared] [4:Gradually Appeared] [5:Gradually Appeared] Primary Etiology: [2:Lymphedema] [4:Lymphedema] [5:Lymphedema] Comorbid History: [2:Lymphedema, Hypertension] [4:N/A] [5:N/A] Date Acquired: [2:08/08/2017] [4:04/07/2018] [5:04/07/2018] Weeks of Treatment: [2:35] [4:5] [5:5] Wound Status: [2:Open] [4:Open] [5:Open] Clustered Wound: [2:Yes] [4:No] [5:No] Measurements L x W x D [2:2.4x2.3x0.1] [4:0.5x0.4x0.1] [5:1.2x1.7x0.1] (cm) Area (cm) : [2:4.335] [4:0.157] [5:1.602] Volume (cm) : [2:0.434] [4:0.016] [5:0.16] % Reduction in Area: [2:79.80%] [4:98.70%] [5:79.20%] % Reduction in Volume: [2:93.30%] [4:98.60%] [5:95.80%] Classification: [2:Full Thickness With Exposed Support Structures] [4:Partial Thickness] [5:Full Thickness Without Exposed Support Structures] Exudate Amount: [2:Medium] [4:N/A] [5:N/A] Exudate Type: [2:Serous] [4:N/A] [5:N/A] Exudate Color: [2:amber] [4:N/A] [5:N/A] Wound Margin: [2:Distinct, outline attached] [4:N/A] [5:N/A] Granulation Amount: [2:Large (67-100%)] [4:N/A] [5:N/A] Granulation Quality: [2:Red] [4:N/A]  [5:N/A] Necrotic Amount: [2:Small (1-33%)] [4:N/A] [5:N/A] Exposed Structures: [2:Fat Layer (Subcutaneous Tissue) Exposed: Yes Fascia: No Tendon: No Muscle: No Joint: No Bone: No] [4:N/A] [5:N/A] Epithelialization: [2:Large (67-100%)] [4:N/A] [5:N/A] Periwound Skin Texture: [2:Excoriation: No Induration: No Callus: No Crepitus: No] [4:No Abnormalities Noted] [5:No Abnormalities Noted] Rash: No Scarring: No Periwound Skin Moisture: Maceration: No No Abnormalities Noted No Abnormalities Noted Dry/Scaly: No Periwound Skin Color: Atrophie Blanche: No No Abnormalities Noted No Abnormalities Noted Cyanosis: No Ecchymosis: No Erythema: No Hemosiderin Staining: No Mottled: No Pallor: No Rubor: No Tenderness on Palpation: No No No Wound Preparation: Ulcer Cleansing: N/A N/A Rinsed/Irrigated with Saline, Other: soap and water Topical Anesthetic Applied: Other: lidocaine 4% Treatment Notes Electronic Signature(s) Signed: 05/13/2018 4:53:36 PM By: Renne Crigler Entered By: Renne Crigler on 05/13/2018 11:51:48 Merwin, [name] ([id number]) -------------------------------------------------------------------------------- Multi-Disciplinary Care Plan Details Patient Name: Jessica Casey R. Date of Service: 05/13/2018 11:15 AM Medical Record Number: [id number] Patient Account Number: [id number] Date of Birth/Sex: April 26, 1981 (37 y.o. F) Treating RN: Curtis Sites Primary Care Norman Bier: PATIENT, NO Other Clinician: Referring Donyelle Enyeart: Referral, Self Treating Desiree Daise/Extender: STONE III, HOYT Weeks in Treatment: 35 Active Inactive ` Orientation to the Wound Care Program Nursing Diagnoses: Knowledge deficit related to the wound healing center program Goals: Patient/caregiver will verbalize understanding of the Wound Healing Center Program Date Initiated: 09/10/2017 Target Resolution Date: 11/23/2017 Goal Status: Active Interventions: Provide education on orientation to the wound  center Notes: ` Venous Leg Ulcer Nursing Diagnoses: Knowledge deficit related to disease process and management Goals: Patient/caregiver will verbalize understanding of disease process and disease management Date Initiated: 09/10/2017 Target Resolution Date: 11/23/2017 Goal Status: Active Interventions: Assess peripheral edema status every visit. Notes: ` Wound/Skin Impairment Nursing Diagnoses: Impaired tissue integrity Goals: Ulcer/skin breakdown will heal within 14 weeks Date Initiated: 09/10/2017 Target Resolution Date: 11/24/2017 Goal Status: Active Interventions: ROCKY, RISHEL ([id number]) Assess patient/caregiver ability to obtain necessary supplies Assess patient/caregiver ability to perform ulcer/skin care regimen upon admission and as needed Assess ulceration(s) every visit Notes: Electronic Signature(s) Signed: 05/13/2018 4:53:29 PM By: Curtis Sites Signed: 05/13/2018 4:53:36 PM By: Renne Crigler Previous Signature: 05/13/2018 11:47:32 AM Version By: Curtis Sites Entered By: Renne Crigler on 05/13/2018 11:51:34 Hedman, Adyson R. ([id number]) -------------------------------------------------------------------------------- Pain Assessment Details Patient Name: Jessica Casey R. Date of Service: 05/13/2018 11:15 AM Medical Record Number: [id number] Patient Account Number: [id number] Date of Birth/Sex: [birth year] (37 y.o. F) Treating RN: Curtis Sites Primary Care Jaeda Bruso: PATIENT, NO Other Clinician: Referring Willetta York: Referral, Self Treating Shervon Kerwin/Extender: STONE III, HOYT Weeks in Treatment: 35 Active Problems Location of Pain Severity and Description of Pain Patient Has Paino No Site Locations Pain Management and Medication  Current Pain Management: Electronic Signature(s) Signed: 05/13/2018 4:53:29 PM By: Curtis Sites Entered By: Curtis Sites on 05/13/2018 11:26:12 [name]  ([id number]) -------------------------------------------------------------------------------- Patient/Caregiver Education Details Patient Name: Jessica Casey R. Date of Service: 05/13/2018 11:15 AM Medical Record Number: [id number] Patient Account Number: [id number] Date of Birth/Gender: [birth year] (37 y.o. F) Treating RN: Renne Crigler Primary Care Physician: PATIENT, NO Other Clinician: Referring Physician: Referral, Self Treating Physician/Extender: Linwood Dibbles, HOYT Weeks in Treatment: 35 Education Assessment Education Provided To: Patient Education Topics Provided Wound Debridement: Handouts: Wound Debridement Methods: Explain/Verbal Responses: State content correctly Wound/Skin Impairment: Handouts: Caring for Your Ulcer Methods: Explain/Verbal Responses: State content correctly Electronic Signature(s) Signed: 05/13/2018 4:53:36 PM By: Renne Crigler Entered By: Renne Crigler on 05/13/2018 12:00:29 Cronk, Autumn R. ([id number]) -------------------------------------------------------------------------------- Wound Assessment Details Patient Name: Jessica Casey R. Date of Service: 05/13/2018 11:15 AM Medical Record Number: [id number] Patient Account Number: [id number] Date of Birth/Sex: 1980-12-22 (37 y.o. F) Treating RN: Curtis Sites Primary Care Rubee Vega: PATIENT, NO Other Clinician: Referring Wadsworth Skolnick: Referral, Self Treating Amaka Gluth/Extender: STONE III, HOYT Weeks in Treatment: 35 Wound Status Wound Number: 2 Primary Etiology: Lymphedema Wound Location: Right Lower Leg - Lateral, Proximal Wound Status: Open Wounding Event: Gradually Appeared Comorbid History: Lymphedema, Hypertension Date Acquired: 08/08/2017 Weeks Of Treatment: 35 Clustered Wound: Yes Photos Photo Uploaded By: Curtis Sites on 05/13/2018 12:31:38 Wound Measurements Length: (cm) 2.4 Width: (cm) 2.3 Depth: (cm) 0.1 Area: (cm) 4.335 Volume: (cm) 0.434 % Reduction in Area: 79.8% %  Reduction in Volume: 93.3% Epithelialization: Large (67-100%) Tunneling: No Undermining: No Wound Description Full Thickness With Exposed Support Classification: Structures Wound Margin: Distinct, outline attached Exudate Medium Amount: Exudate Type: Serous Exudate Color: amber Foul Odor After Cleansing: No Slough/Fibrino Yes Wound Bed Granulation Amount: Large (67-100%) Exposed Structure Granulation Quality: Red Fascia Exposed: No Necrotic Amount: Small (1-33%) Fat Layer (Subcutaneous Tissue) Exposed: Yes Necrotic Quality: Adherent Slough Tendon Exposed: No Muscle Exposed: No Joint Exposed: No Bone Exposed: No Schlechter, [name] ([id number]) Periwound Skin Texture Texture Color No Abnormalities Noted: No No Abnormalities Noted: No Callus: No Atrophie Blanche: No Crepitus: No Cyanosis: No Excoriation: No Ecchymosis: No Induration: No Erythema: No Rash: No Hemosiderin Staining: No Scarring: No Mottled: No Pallor: No Moisture Rubor: No No Abnormalities Noted: No Dry / Scaly: No Maceration: No Wound Preparation Ulcer Cleansing: Rinsed/Irrigated with Saline, Other: soap and water, Topical Anesthetic Applied: Other: lidocaine 4%, Treatment Notes Wound #2 (Right, Proximal, Lateral Lower Leg) 1. Cleansed with: Clean wound with Normal Saline 2. Anesthetic Topical Lidocaine 4% cream to wound bed prior to debridement 4. Dressing Applied: Hydrafera Blue 5. Secondary Dressing Applied ABD Pad 7. Secured with 4 Layer Compression System - Bilateral Notes unna to anchor Electronic Signature(s) Signed: 05/13/2018 11:51:26 AM By: Curtis Sites Entered By: Curtis Sites on 05/13/2018 11:51:24 Dacosta, Rebecka R. ([id number]) -------------------------------------------------------------------------------- Wound Assessment Details Patient Name: Jessica Casey R. Date of Service: 05/13/2018 11:15 AM Medical Record Number: [id number] Patient Account Number: [id number] Date  of Birth/Sex: 1981-04-26 (37 y.o. F) Treating RN: Curtis Sites Primary Care Arlen Dupuis: PATIENT, NO Other Clinician: Referring Kendan Cornforth: Referral, Self Treating Chiante Peden/Extender: STONE III, HOYT Weeks in Treatment: 35 Wound Status Wound Number: 4 Primary Etiology: Lymphedema Wound Location: Right Lower Leg - Lateral, Distal Wound Status: Open Wounding Event: Gradually Appeared Comorbid History: Lymphedema, Hypertension Date Acquired: 04/07/2018 Weeks Of Treatment: 5 Clustered Wound: No Photos Photo Uploaded By: Curtis Sites on 05/13/2018 12:31:39 Wound Measurements Length: (cm) 0.5 Width: (cm) 0.4 Depth: (cm) 0.1 Area: (cm) 0.157 Volume: (  cm) 0.016 % Reduction in Area: 98.7% % Reduction in Volume: 98.6% Epithelialization: Small (1-33%) Tunneling: No Undermining: No Wound Description Classification: Partial Thickness Wound Margin: Flat and Intact Exudate Amount: Medium Exudate Type: Serous Exudate Color: amber Foul Odor After Cleansing: No Slough/Fibrino Yes Wound Bed Granulation Amount: Large (67-100%) Exposed Structure Granulation Quality: Red Fascia Exposed: No Necrotic Amount: Small (1-33%) Fat Layer (Subcutaneous Tissue) Exposed: Yes Necrotic Quality: Adherent Slough Tendon Exposed: No Muscle Exposed: No Joint Exposed: No Bone Exposed: No Periwound Skin Texture Caillier, [name] ([id number]) Texture Color No Abnormalities Noted: No No Abnormalities Noted: No Callus: No Atrophie Blanche: No Crepitus: No Cyanosis: No Excoriation: No Ecchymosis: No Induration: No Erythema: No Rash: No Hemosiderin Staining: No Scarring: No Mottled: No Pallor: No Moisture Rubor: No No Abnormalities Noted: No Dry / Scaly: No Temperature / Pain Maceration: No Temperature: No Abnormality Wound Preparation Ulcer Cleansing: Rinsed/Irrigated with Saline, Other: soap and water, Topical Anesthetic Applied: Other: lidocaine 4%, Treatment Notes Wound #4 (Right,  Distal, Lateral Lower Leg) 1. Cleansed with: Clean wound with Normal Saline 2. Anesthetic Topical Lidocaine 4% cream to wound bed prior to debridement 4. Dressing Applied: Hydrafera Blue 5. Secondary Dressing Applied ABD Pad 7. Secured with 4 Layer Compression System - Bilateral Notes unna to Ecologist) Signed: 05/13/2018 11:51:50 AM By: Curtis Sites Entered By: Curtis Sites on 05/13/2018 11:51:50 Guallpa, [name] ([id number]) -------------------------------------------------------------------------------- Wound Assessment Details Patient Name: Jessica Casey R. Date of Service: 05/13/2018 11:15 AM Medical Record Number: [id number] Patient Account Number: [id number] Date of Birth/Sex: 1981/02/23 (37 y.o. F) Treating RN: Curtis Sites Primary Care Clara Herbison: PATIENT, NO Other Clinician: Referring Jocee Kissick: Referral, Self Treating Geselle Cardosa/Extender: STONE III, HOYT Weeks in Treatment: 35 Wound Status Wound Number: 5 Primary Etiology: Lymphedema Wound Location: Left Lower Leg - Anterior Wound Status: Open Wounding Event: Gradually Appeared Comorbid History: Lymphedema, Hypertension Date Acquired: 04/07/2018 Weeks Of Treatment: 5 Clustered Wound: No Photos Photo Uploaded By: Curtis Sites on 05/13/2018 12:31:55 Wound Measurements Length: (cm) 1.2 Width: (cm) 1.7 Depth: (cm) 0.1 Area: (cm) 1.602 Volume: (cm) 0.16 % Reduction in Area: 79.2% % Reduction in Volume: 95.8% Epithelialization: Small (1-33%) Tunneling: No Undermining: No Wound Description Full Thickness Without Exposed Support Classification: Structures Wound Margin: Distinct, outline attached Exudate Medium Amount: Exudate Type: Serous Exudate Color: amber Foul Odor After Cleansing: No Slough/Fibrino Yes Wound Bed Granulation Amount: Large (67-100%) Exposed Structure Granulation Quality: Red Fascia Exposed: No Necrotic Amount: None Present (0%) Fat Layer (Subcutaneous  Tissue) Exposed: Yes Tendon Exposed: No Muscle Exposed: No Joint Exposed: No Bone Exposed: No Shinsky, [name] ([id number]) Periwound Skin Texture Texture Color No Abnormalities Noted: No No Abnormalities Noted: No Callus: No Atrophie Blanche: No Crepitus: No Cyanosis: No Excoriation: No Ecchymosis: No Induration: No Erythema: No Rash: No Hemosiderin Staining: No Scarring: No Mottled: No Pallor: No Moisture Rubor: No No Abnormalities Noted: No Dry / Scaly: No Temperature / Pain Maceration: No Temperature: No Abnormality Wound Preparation Ulcer Cleansing: Rinsed/Irrigated with Saline, Other: soap and water, Topical Anesthetic Applied: Other: lidocaine 4%, Treatment Notes Wound #5 (Left, Anterior Lower Leg) 1. Cleansed with: Clean wound with Normal Saline 2. Anesthetic Topical Lidocaine 4% cream to wound bed prior to debridement 4. Dressing Applied: Hydrafera Blue 5. Secondary Dressing Applied ABD Pad 7. Secured with 4 Layer Compression System - Bilateral Notes unna to anchor Electronic Signature(s) Signed: 05/13/2018 11:52:15 AM By: Curtis Sites Entered By: Curtis Sites on 05/13/2018 11:52:14 [place], [name] ([id number]) -------------------------------------------------------------------------------- Vitals Details Patient Name: [name],  [name] Date of Service: 05/13/2018 11:15 AM Medical Record Number: [id number] Patient Account Number: [id number] Date of Birth/Sex: 14-Feb-1981 (37 y.o. F) Treating RN: Curtis Sitesorthy, Joanna Primary Care Mirtie Bastyr: PATIENT, NO Other Clinician: Referring Miriam Kestler: Referral, Self Treating Sruthi Maurer/Extender: STONE III, HOYT Weeks in Treatment: 35 Vital Signs Time Taken: 11:26 Temperature (F): 97.6 Height (in): 74 Pulse (bpm): 97 Weight (lbs): 486 Respiratory Rate (breaths/min): 18 Body Mass Index (BMI): 62.4 Blood Pressure (mmHg): 148/80 Reference Range: 80 - 120 mg / dl Electronic Signature(s) Signed: 05/13/2018 4:53:29 PM  By: Curtis Sitesorthy, Joanna Entered By: Curtis Sitesorthy, Joanna on 05/13/2018 11:26:36

## 2018-05-15 NOTE — Progress Notes (Signed)
Jessica HindLEATH, Guiselle R. ([id number]) Visit Report for 05/13/2018 Chief Complaint Document Details Patient Name: Jessica HindLEATH, Daysy R. Date of Service: 05/13/2018 11:15 AM Medical Record Number: [id number] Patient Account Number: [id number] Date of Birth/Sex: 1981-06-28 (37 y.o. F) Treating RN: Renne CriglerFlinchum, Cheryl Primary Care Provider: PATIENT, NO Other Clinician: Referring Provider: Referral, Self Treating Provider/Extender: STONE III, HOYT Weeks in Treatment: 35 Information Obtained from: Patient Chief Complaint She is here in follow up evaluation of right lower extremity ulcers Electronic Signature(s) Signed: 05/13/2018 11:25:57 PM By: Lenda KelpStone III, Hoyt PA-C Entered By: Lenda KelpStone III, Hoyt on 05/13/2018 11:50:32 Sellen, Jaala R. ([id number]) -------------------------------------------------------------------------------- Debridement Details Patient Name: [name], [name] Date of Service: 05/13/2018 11:15 AM Medical Record Number: [id number] Patient Account Number: [id number] Date of Birth/Sex: 1981-06-28 (37 y.o. F) Treating RN: Renne CriglerFlinchum, Cheryl Primary Care Provider: PATIENT, NO Other Clinician: Referring Provider: Referral, Self Treating Provider/Extender: STONE III, HOYT Weeks in Treatment: 35 Debridement Performed for Wound #2 Right,Proximal,Lateral Lower Leg Assessment: Performed By: Physician STONE III, HOYT E., PA-C Debridement Type: Debridement Pre-procedure Verification/Time Yes - 11:52 Out Taken: Start Time: 11:52 Pain Control: Other : lidocaine 4% Total Area Debrided (L x W): 2 (cm) x 2 (cm) = 4 (cm) Tissue and other material Viable, Non-Viable, Slough, Subcutaneous, Biofilm, Slough debrided: Level: Skin/Subcutaneous Tissue Debridement Description: Excisional Instrument: Curette Bleeding: Minimum Hemostasis Achieved: Pressure End Time: 11:54 Procedural Pain: 0 Post Procedural Pain: 0 Response to Treatment: Procedure was tolerated well Level of Consciousness: Awake and Alert Post  Procedure Vitals: Temperature: 97.6 Pulse: 97 Respiratory Rate: 18 Blood Pressure: Systolic Blood Pressure: 148 Diastolic Blood Pressure: 80 Post Debridement Measurements of Total Wound Length: (cm) 2.4 Width: (cm) 2.3 Depth: (cm) 0.2 Volume: (cm) 0.867 Character of Wound/Ulcer Post Debridement: Stable Post Procedure Diagnosis Same as Pre-procedure Electronic Signature(s) Signed: 05/13/2018 4:53:36 PM By: Renne CriglerFlinchum, Cheryl Signed: 05/13/2018 11:25:57 PM By: Lenda KelpStone III, Hoyt PA-C Entered By: Renne CriglerFlinchum, Cheryl on 05/13/2018 11:54:29 Guyett, Nyaja R. ([id number]) -------------------------------------------------------------------------------- Debridement Details Patient Name: [name], [name] Date of Service: 05/13/2018 11:15 AM Medical Record Number: [id number] Patient Account Number: [id number] Date of Birth/Sex: 1981-06-28 (37 y.o. F) Treating RN: Renne CriglerFlinchum, Cheryl Primary Care Provider: PATIENT, NO Other Clinician: Referring Provider: Referral, Self Treating Provider/Extender: STONE III, HOYT Weeks in Treatment: 35 Debridement Performed for Wound #4 Right,Distal,Lateral Lower Leg Assessment: Performed By: Physician STONE III, HOYT E., PA-C Debridement Type: Debridement Pre-procedure Verification/Time Yes - 11:52 Out Taken: Start Time: 11:52 Pain Control: Other : lidocaine 4% Total Area Debrided (L x W): 0.5 (cm) x 0.4 (cm) = 0.2 (cm) Tissue and other material Viable, Non-Viable, Slough, Subcutaneous, Biofilm, Slough debrided: Level: Skin/Subcutaneous Tissue Debridement Description: Excisional Instrument: Curette Bleeding: Minimum Hemostasis Achieved: Pressure End Time: 11:54 Procedural Pain: 0 Post Procedural Pain: 0 Response to Treatment: Procedure was tolerated well Level of Consciousness: Awake and Alert Post Procedure Vitals: Temperature: 97.6 Pulse: 97 Respiratory Rate: 18 Blood Pressure: Systolic Blood Pressure: 148 Diastolic Blood Pressure: 80 Post  Debridement Measurements of Total Wound Length: (cm) 0.5 Width: (cm) 0.4 Depth: (cm) 0.2 Volume: (cm) 0.031 Character of Wound/Ulcer Post Debridement: Stable Post Procedure Diagnosis Same as Pre-procedure Electronic Signature(s) Signed: 05/13/2018 4:53:36 PM By: Renne CriglerFlinchum, Cheryl Signed: 05/13/2018 11:25:57 PM By: Lenda KelpStone III, Hoyt PA-C Entered By: Renne CriglerFlinchum, Cheryl on 05/13/2018 11:55:08 Doffing, Tanise R. ([id number]) -------------------------------------------------------------------------------- HPI Details Patient Name: [name], [name] Date of Service: 05/13/2018 11:15 AM Medical Record Number: [id number] Patient Account Number: [id number] Date of Birth/Sex: 1981-06-28 (37 y.o. F) Treating RN: Renne CriglerFlinchum, Cheryl Primary Care Provider: PATIENT,  NO Other Clinician: Referring Provider: Referral, Self Treating Provider/Extender: STONE III, HOYT Weeks in Treatment: 35 History of Present Illness HPI Description: 37 year old patient well known to our [facility] wound care clinic where she has been seen since 2016 for bilateral lower extremity venous insufficiency disease with lymphedema and multiple ulcerations associated with morbid obesity. she had custom-made compression stockings and lymphedema pumps which were used in the past. most recently she was admitted to the hospital between October 11 and 09/02/2017 with sepsis, lower extremity wounds and lymphedema.she was initially treated in the outpatient with Keflex and Bactrim. she was initially treated in the hospital with vancomycin and Zosyn and changed over to Unasyn until her white count improved and her blood cultures were negative for 3 days. After her inpatient management she was discharged home on Augmentin to end on 09/13/2017 with a 14 day course. she has had outpatient vascular duplex scans completed in November 2017 and her right ABI was 1.1 and the left ABI is 1.3. she had normal toe brachial indices bilaterally.she had  three-vessel runoff in the right lower extremity and two-vessel runoff in the left lower extremity. On questioning the patient she does have custom made compression stockings and also has a lymphedema pump but has not been using it appropriately and has not been taking good care of herself. 09/17/2017 -- she returns today with compression stockings on the left side and the right side has had significant amount of drainage and has a very strong odor 09/24/2017 -- the drainage is increased significantly and she has more lymphedema and a very strong odor to her wound. Though she does not have systemic symptoms, or overt infection I believe she will benefit from some doxycycline given empirically. 10/01/2017 -- after starting the doxycycline and changing the dressing twice a week her symptoms and signs have definitely improved overall. 10/08/2017 -- she has completed her course of doxycycline but continues to have a lot of drainage and needs twice a week dressing changes. 11/08/17-she is here in follow-up evaluation for right lower extremity ulcers. She admits to using her lymphedema pumps twice daily, one hour per session. she is voicing no complaints or concerns, no signs of infection will change to [facility] 12/14/17 on evaluation today patient appears to be doing very well in regard to her wounds. She has been tolerating the dressing changes she continues to develop some portly the adherent granular tissue on the surface of the wound with some Slough. Obviously we are trying to get too much better wound bed. With that being said the hyper granulation the Hydrofera Blue Dressing to have helped with which is excellent news. However I think it may be time to try something a little bit different at this point. 01/11/18 on evaluation today patient appears to be doing fairly well in regard to her right lateral lower extremity ulcers. This shows excellent signs of filling in which is great news. There does  not appear to be any evidence of infection which is also good news. She does continue to work as well is good school. She is having no pain. 01/22/18 on evaluation today patient appears to be doing a little bit worse in my opinion in regard to the overall quality of that granulation on her right lower extremity. She was not here last week due to being sick with a stomach virus this may have something to do with the fact that her wound appears to be a little bit worse. With that being said I'm also  thinking that after switching from the [facility] Dressing to the silver collagen would really has not looked that's good in my opinion. We may want to swit 02/11/18 on evaluation today patient's right lower/lateral lower extremity ulcers appear to be doing very well at this point. Minks, [name] ([id number]) Especially the more proximal ulcer has filled in much closer to surface which is good news. Nonetheless both show signs of improvement which is great news. There does not appear to be any evidence of infection which is also good news. In general patient has been doing well tolerating the wraps as well as the [employer]. 02/18/18 on evaluation today patient appears to be doing a little bit worse in regard to the periwound region the wounds themselves do not look much deteriorated to me. With that being said she has several small blisters/pustules noted in the periwound and there was a significant amount of drainage and maceration compared to previous. There has been a time that we had to bring her back for twice a week dressing changes as far as her wrap was concerned it has been a while since we've done that however. With that being said the patient has been having some burning and in general I'm concerned about the possibility of infection. She has previously taken doxycycline with good result. Fortunately there does not appear to be any evidence of overall worsening in regard to the  size of the wound and in fact the upper wound actually appears to be showing signs of good epithelialization. 03/18/18 on evaluation today patient appears to be doing excellent in regard to her right lateral lower extremity ulcer. She has been tolerating the dressing changes without complication. Fortunately this seems to be making great progress. Overall I see no signs of infection and there is dramatic improvement overall even compared to last week. 03/28/18 on evaluation today patient appears to be doing very well in regard to her right lateral lower surety ulcer. She has been tolerating the dressing changes without complication at this point. She states currently that she's having no significant discomfort which is excellent as well. Overall I'm pleased with how things seem to be progressing. 04/01/18 on evaluation today patient appears to be doing excellent in regard to her right lateral lower extremity ulcers. She has been tolerating the dressing changes without complication which is good news. With that being said the wraps still continues to show signs of helping with her fluid she does have Juxta-Lite compression stockings for when we are done with the current treatment regimen once everything heals. Mainly she just has the one area still remaining the smaller of the two wings is pretty much closed at this point there's just a very slight opening noted. 04/08/18 on evaluation today patient appears to be doing better in regard to the original wound on the right lateral lower extremity that we have been managing. Unfortunately she has a new area of weeping more anterior on the right lateral lower extremity and on the left lower extremity she has two new ulcers there appears to be some cellulitis noted at this time. I am concerned about the fact that this may in fact be an infection that has caused the worsening and swelling in the past this has been the case when we previously attempted to  determine what was going on when she had down slides like this. With that being said the patient is seeming to tolerate the wraps fairly well for the most part. 04/17/18;  since last time the patient was seen in this clinic she was hospitalized from 04/11/18 through 04/13/18; she presented with bilateral lower extremity pain worse on the right and a fever of up to 104. Noteworthy that when she was in the clinic last week she had new wounds on the left leg culture grew MRSA and she was prescribed Bactrim. She had 2 days of IV Vanco and Zosyn in the hospital. Her blood cultures were negative. She was discharged on Bactrim to cover the original MRSA on the right leg and Keflex to cover the possibility of strep. The hospitalist had a conversation with infectious disease. The patient arrives in clinic today for nurse check however given the recent hospitalization I was asked to look at her. The patient states she feels a lot better. No fever or chills. Still some pain in the right calf but a lot better. She arrived in the hospital with a white count of 15.6, the next day was 10.8. Comprehensive metabolic panel was normal. She is still taking Keflex and Bactrim It would appear that she had a surgical IandD at the bedside of the right calf felt to have a underlying abscess. According to our intake nurse the wound has expanded quite bit on the right lateral calf. She has no open area on the left anterior and left posterior calf as described last time 04/23/18 on evaluation today patient actually appears to be doing much better than when I last saw her. This obviously has been a couple weeks ago and in the interim she was admitted to the hospital for IV antibiotic therapy due to cellulitis, discharge, and fortunately the substance which hadn't sued has completely resolved. Her swelling seems to be better in regard to her lower extremities as well and the wounds that opened up as results of the infection seems to  be showing signs of improvement. There is some Slough noted on the left lateral wound otherwise a lot of weeping noted of the right lateral leg. 04/29/18 on evaluation today patient appears to be doing excellent in regard to her bilateral lower extremity ulcers. She's been tolerating the dressing changes without complication there does not appear to be any evidence of infection at this time. Overall I think she is made great improvement and seems to be showing signs of coming back around where she was prior to the cellulitis and sepsis episode. BILLIEJEAN, SCHIMEK ([id number]) 05/06/18 on evaluation today patient appears to be doing better in regard to her bilateral ocean the ulcers. She's been tolerating the dressing changes without complication. Fortunately there does not appear to be any evidence of infection at this time. Her swelling is significantly down overall seems to be showing signs of good improvement as well. Fortunately I do believe that she is progressing nicely back to where she was prior to the cellulitis/sepsis issue. 05/13/18 on evaluation today patient seems to be showing signs of great improvement she's been tolerating the dressing changes without complication and overall there does not appear to be any evidence of infection. All of which is good news. The only issue she really have today is that some of the [facility] Dressing actually was stuck to the periwound of the right lateral lower extremity however this was able to be removed during the debridement without complication. Electronic Signature(s) Signed: 05/13/2018 11:25:57 PM By: Lenda Kelp PA-C Entered By: Lenda Kelp on 05/13/2018 17:01:34 Winning, Zsofia RMarland Kitchen ([id number]) -------------------------------------------------------------------------------- Physical Exam Details Patient Name: Jessica Casey R. Date  of Service: 05/13/2018 11:15 AM Medical Record Number: [id number] Patient Account Number: [id number] Date of  Birth/Sex: 09/19/1981 (36 y.o. F) Treating RN: Renne Crigler Primary Care Provider: PATIENT, NO Other Clinician: Referring Provider: Referral, Self Treating Provider/Extender: STONE III, HOYT Weeks in Treatment: 35 Constitutional Obese and well-hydrated in no acute distress. Respiratory normal breathing without difficulty. Cardiovascular 1+ pitting edema of the bilateral lower extremities. Psychiatric this patient is able to make decisions and demonstrates good insight into disease process. Alert and Oriented x 3. pleasant and cooperative. Notes Patient's wound did require sharp debridement today. Post debridement the wound beds all appear to be doing better I was able to remove all the residual dressing material which was adhered to the wound margin and overall I feel like she's making great progress. There is no evidence of infection. Electronic Signature(s) Signed: 05/13/2018 11:25:57 PM By: Lenda Kelp PA-C Entered By: Lenda Kelp on 05/13/2018 17:04:44 [name] ([id number]) -------------------------------------------------------------------------------- Physician Orders Details Patient Name: Jessica Casey R. Date of Service: 05/13/2018 11:15 AM Medical Record Number: [id number] Patient Account Number: [id number] Date of Birth/Sex: Oct 27, 1981 (37 y.o. F) Treating RN: Renne Crigler Primary Care Provider: PATIENT, NO Other Clinician: Referring Provider: Referral, Self Treating Provider/Extender: STONE III, HOYT Weeks in Treatment: 35 Verbal / Phone Orders: No Diagnosis Coding Wound Cleansing Wound #2 Right,Proximal,Lateral Lower Leg o Clean wound with Normal Saline. o Cleanse wound with mild soap and water Wound #4 Right,Distal,Lateral Lower Leg o Clean wound with Normal Saline. o Cleanse wound with mild soap and water Wound #5 Left,Anterior Lower Leg o Clean wound with Normal Saline. o Cleanse wound with mild soap and water Anesthetic (add to  Medication List) Wound #2 Right,Proximal,Lateral Lower Leg o Topical Lidocaine 4% cream applied to wound bed prior to debridement (In Clinic Only). Wound #4 Right,Distal,Lateral Lower Leg o Topical Lidocaine 4% cream applied to wound bed prior to debridement (In Clinic Only). Wound #5 Left,Anterior Lower Leg o Topical Lidocaine 4% cream applied to wound bed prior to debridement (In Clinic Only). Skin Barriers/Peri-Wound Care Wound #2 Right,Proximal,Lateral Lower Leg o Moisturizing lotion Wound #4 Right,Distal,Lateral Lower Leg o Moisturizing lotion Wound #5 Left,Anterior Lower Leg o Moisturizing lotion Primary Wound Dressing Wound #2 Right,Proximal,Lateral Lower Leg o Hydrafera Blue Ready Transfer Wound #4 Right,Distal,Lateral Lower Leg o Hydrafera Blue Ready Transfer Wound #5 Left,Anterior Lower Leg o Hydrafera Blue Ready Transfer Secondary Dressing Lucchesi, Deshia R. ([id number]) Wound #2 Right,Proximal,Lateral Lower Leg o ABD pad Wound #4 Right,Distal,Lateral Lower Leg o ABD pad Wound #5 Left,Anterior Lower Leg o ABD pad Dressing Change Frequency Wound #2 Right,Proximal,Lateral Lower Leg o Other: - twice weekly, return for nurse visit friday Wound #4 Right,Distal,Lateral Lower Leg o Other: - twice weekly, return for nurse visit friday Wound #5 Left,Anterior Lower Leg o Other: - twice weekly, return for nurse visit friday Follow-up Appointments Wound #2 Right,Proximal,Lateral Lower Leg o Return Appointment in 1 week. o Nurse Visit as needed o Other: - twice weekly clinic visit, return on friday for nurse visit Wound #4 Right,Distal,Lateral Lower Leg o Return Appointment in 1 week. o Nurse Visit as needed o Other: - twice weekly clinic visit, return on friday for nurse visit Wound #5 Left,Anterior Lower Leg o Nurse Visit as needed o Other: - twice weekly clinic visit, return on friday for nurse visit Edema Control Wound #2  Right,Proximal,Lateral Lower Leg o 4 Layer Compression System - Bilateral - unna to anchor o Compression Pump: Use compression pump on left lower extremity  for 30 minutes, twice daily. - one hour use o Compression Pump: Use compression pump on right lower extremity for 30 minutes, twice daily. - one hour use Wound #4 Right,Distal,Lateral Lower Leg o 4 Layer Compression System - Bilateral - unna to anchor o Compression Pump: Use compression pump on left lower extremity for 30 minutes, twice daily. - one hour use o Compression Pump: Use compression pump on right lower extremity for 30 minutes, twice daily. - one hour use Wound #5 Left,Anterior Lower Leg o 4 Layer Compression System - Bilateral - unna to anchor o Compression Pump: Use compression pump on left lower extremity for 30 minutes, twice daily. - one hour use o Compression Pump: Use compression pump on right lower extremity for 30 minutes, twice daily. - one hour use Additional Orders / Instructions o Vitamin A; Vitamin C, Zinc o Increase protein intake. KRISSA, UTKE ([id number]) Electronic Signature(s) Signed: 05/13/2018 4:53:36 PM By: Renne Crigler Signed: 05/13/2018 11:25:57 PM By: Lenda Kelp PA-C Entered By: Renne Crigler on 05/13/2018 11:56:48 Karges, Evetta R. ([id number]) -------------------------------------------------------------------------------- Problem List Details Patient Name: Jessica Casey R. Date of Service: 05/13/2018 11:15 AM Medical Record Number: [id number] Patient Account Number: [id number] Date of Birth/Sex: [birth year] (37 y.o. F) Treating RN: Renne Crigler Primary Care Provider: PATIENT, NO Other Clinician: Referring Provider: Referral, Self Treating Provider/Extender: Linwood Dibbles, HOYT Weeks in Treatment: 35 Active Problems ICD-10 Evaluated Encounter Code Description Active Date Today Diagnosis L97.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d  09/10/2017 No Yes L97.221 Non-pressure chronic ulcer of left calf limited to breakdown of 04/17/2018 No Yes skin I89.0 Lymphedema, not elsewhere classified 09/10/2017 No Yes I87.311 Chronic venous hypertension (idiopathic) with ulcer of right 09/10/2017 No Yes lower extremity E66.01 Morbid (severe) obesity due to excess calories 09/10/2017 No Yes Inactive Problems Resolved Problems Electronic Signature(s) Signed: 05/13/2018 11:25:57 PM By: Lenda Kelp PA-C Entered By: Lenda Kelp on 05/13/2018 11:50:23 Curnow, Janylah R. ([id number]) -------------------------------------------------------------------------------- Progress Note Details Patient Name: Jessica Casey R. Date of Service: 05/13/2018 11:15 AM Medical Record Number: [id number] Patient Account Number: [id number] Date of Birth/Sex: 1980-12-12 (37 y.o. F) Treating RN: Renne Crigler Primary Care Provider: PATIENT, NO Other Clinician: Referring Provider: Referral, Self Treating Provider/Extender: STONE III, HOYT Weeks in Treatment: 35 Subjective Chief Complaint Information obtained from Patient She is here in follow up evaluation of right lower extremity ulcers History of Present Illness (HPI) 37 year old patient well known to our [facility] wound care clinic where she has been seen since 2016 for bilateral lower extremity venous insufficiency disease with lymphedema and multiple ulcerations associated with morbid obesity. she had custom-made compression stockings and lymphedema pumps which were used in the past. most recently she was admitted to the hospital between October 11 and 09/02/2017 with sepsis, lower extremity wounds and lymphedema.she was initially treated in the outpatient with Keflex and Bactrim. she was initially treated in the hospital with vancomycin and Zosyn and changed over to Unasyn until her white count improved and her blood cultures were negative for 3 days. After her inpatient management she was  discharged home on Augmentin to end on 09/13/2017 with a 14 day course. she has had outpatient vascular duplex scans completed in November 2017 and her right ABI was 1.1 and the left ABI is 1.3. she had normal toe brachial indices bilaterally.she had three-vessel runoff in the right lower extremity and two-vessel runoff in the left lower extremity. On questioning the patient she does have custom made compression stockings  and also has a lymphedema pump but has not been using it appropriately and has not been taking good care of herself. 09/17/2017 -- she returns today with compression stockings on the left side and the right side has had significant amount of drainage and has a very strong odor 09/24/2017 -- the drainage is increased significantly and she has more lymphedema and a very strong odor to her wound. Though she does not have systemic symptoms, or overt infection I believe she will benefit from some doxycycline given empirically. 10/01/2017 -- after starting the doxycycline and changing the dressing twice a week her symptoms and signs have definitely improved overall. 10/08/2017 -- she has completed her course of doxycycline but continues to have a lot of drainage and needs twice a week dressing changes. 11/08/17-she is here in follow-up evaluation for right lower extremity ulcers. She admits to using her lymphedema pumps twice daily, one hour per session. she is voicing no complaints or concerns, no signs of infection will change to [facility] 12/14/17 on evaluation today patient appears to be doing very well in regard to her wounds. She has been tolerating the dressing changes she continues to develop some portly the adherent granular tissue on the surface of the wound with some Slough. Obviously we are trying to get too much better wound bed. With that being said the hyper granulation the Hydrofera Blue Dressing to have helped with which is excellent news. However I think it may be time  to try something a little bit different at this point. 01/11/18 on evaluation today patient appears to be doing fairly well in regard to her right lateral lower extremity ulcers. This shows excellent signs of filling in which is great news. There does not appear to be any evidence of infection which is also good news. She does continue to work as well is good school. She is having no pain. 01/22/18 on evaluation today patient appears to be doing a little bit worse in my opinion in regard to the overall quality of that Mcclaren, [name] ([id number]) granulation on her right lower extremity. She was not here last week due to being sick with a stomach virus this may have something to do with the fact that her wound appears to be a little bit worse. With that being said I'm also thinking that after switching from the [facility] Dressing to the silver collagen would really has not looked that's good in my opinion. We may want to swit 02/11/18 on evaluation today patient's right lower/lateral lower extremity ulcers appear to be doing very well at this point. Especially the more proximal ulcer has filled in much closer to surface which is good news. Nonetheless both show signs of improvement which is great news. There does not appear to be any evidence of infection which is also good news. In general patient has been doing well tolerating the wraps as well as the [employer]. 02/18/18 on evaluation today patient appears to be doing a little bit worse in regard to the periwound region the wounds themselves do not look much deteriorated to me. With that being said she has several small blisters/pustules noted in the periwound and there was a significant amount of drainage and maceration compared to previous. There has been a time that we had to bring her back for twice a week dressing changes as far as her wrap was concerned it has been a while since we've done that however. With that being said the  patient  has been having some burning and in general I'm concerned about the possibility of infection. She has previously taken doxycycline with good result. Fortunately there does not appear to be any evidence of overall worsening in regard to the size of the wound and in fact the upper wound actually appears to be showing signs of good epithelialization. 03/18/18 on evaluation today patient appears to be doing excellent in regard to her right lateral lower extremity ulcer. She has been tolerating the dressing changes without complication. Fortunately this seems to be making great progress. Overall I see no signs of infection and there is dramatic improvement overall even compared to last week. 03/28/18 on evaluation today patient appears to be doing very well in regard to her right lateral lower surety ulcer. She has been tolerating the dressing changes without complication at this point. She states currently that she's having no significant discomfort which is excellent as well. Overall I'm pleased with how things seem to be progressing. 04/01/18 on evaluation today patient appears to be doing excellent in regard to her right lateral lower extremity ulcers. She has been tolerating the dressing changes without complication which is good news. With that being said the wraps still continues to show signs of helping with her fluid she does have Juxta-Lite compression stockings for when we are done with the current treatment regimen once everything heals. Mainly she just has the one area still remaining the smaller of the two wings is pretty much closed at this point there's just a very slight opening noted. 04/08/18 on evaluation today patient appears to be doing better in regard to the original wound on the right lateral lower extremity that we have been managing. Unfortunately she has a new area of weeping more anterior on the right lateral lower extremity and on the left lower extremity she has two new  ulcers there appears to be some cellulitis noted at this time. I am concerned about the fact that this may in fact be an infection that has caused the worsening and swelling in the past this has been the case when we previously attempted to determine what was going on when she had down slides like this. With that being said the patient is seeming to tolerate the wraps fairly well for the most part. 04/17/18; since last time the patient was seen in this clinic she was hospitalized from 04/11/18 through 04/13/18; she presented with bilateral lower extremity pain worse on the right and a fever of up to 104. Noteworthy that when she was in the clinic last week she had new wounds on the left leg culture grew MRSA and she was prescribed Bactrim. She had 2 days of IV Vanco and Zosyn in the hospital. Her blood cultures were negative. She was discharged on Bactrim to cover the original MRSA on the right leg and Keflex to cover the possibility of strep. The hospitalist had a conversation with infectious disease. The patient arrives in clinic today for nurse check however given the recent hospitalization I was asked to look at her. The patient states she feels a lot better. No fever or chills. Still some pain in the right calf but a lot better. She arrived in the hospital with a white count of 15.6, the next day was 10.8. Comprehensive metabolic panel was normal. She is still taking Keflex and Bactrim It would appear that she had a surgical IandD at the bedside of the right calf felt to have a underlying abscess. According to our intake  nurse the wound has expanded quite bit on the right lateral calf. She has no open area on the left anterior and left posterior calf as described last time 04/23/18 on evaluation today patient actually appears to be doing much better than when I last saw her. This obviously has been a couple weeks ago and in the interim she was admitted to the hospital for IV antibiotic therapy due  to cellulitis, discharge, and fortunately the substance which hadn't sued has completely resolved. Her swelling seems to be better in regard to her lower extremities as well and the wounds that opened up as results of the infection seems to be showing signs of improvement. There is some Slough noted on the left lateral wound otherwise a lot of weeping noted of the right lateral leg. MAHOGANY, [name] ([id number]) 04/29/18 on evaluation today patient appears to be doing excellent in regard to her bilateral lower extremity ulcers. She's been tolerating the dressing changes without complication there does not appear to be any evidence of infection at this time. Overall I think she is made great improvement and seems to be showing signs of coming back around where she was prior to the cellulitis and sepsis episode. 05/06/18 on evaluation today patient appears to be doing better in regard to her bilateral ocean the ulcers. She's been tolerating the dressing changes without complication. Fortunately there does not appear to be any evidence of infection at this time. Her swelling is significantly down overall seems to be showing signs of good improvement as well. Fortunately I do believe that she is progressing nicely back to where she was prior to the cellulitis/sepsis issue. 05/13/18 on evaluation today patient seems to be showing signs of great improvement she's been tolerating the dressing changes without complication and overall there does not appear to be any evidence of infection. All of which is good news. The only issue she really have today is that some of the [facility] Dressing actually was stuck to the periwound of the right lateral lower extremity however this was able to be removed during the debridement without complication. Patient History Information obtained from Patient. Family History Kidney Disease - Mother, No family history of Cancer, Diabetes, Heart Disease, Hereditary  Spherocytosis, Hypertension, Lung Disease, Seizures, Stroke, Thyroid Problems, Tuberculosis. Social History Never smoker, Marital Status - Married, Alcohol Use - Never, Drug Use - No History, Caffeine Use - Daily. Review of Systems (ROS) Constitutional Symptoms (General Health) Denies complaints or symptoms of Fever, Chills. Respiratory The patient has no complaints or symptoms. Cardiovascular Complains or has symptoms of LE edema. Psychiatric The patient has no complaints or symptoms. Objective Constitutional Obese and well-hydrated in no acute distress. Vitals Time Taken: 11:26 AM, Height: 74 in, Weight: 486 lbs, BMI: 62.4, Temperature: 97.6 F, Pulse: 97 bpm, Respiratory Rate: 18 breaths/min, Blood Pressure: 148/80 mmHg. Respiratory normal breathing without difficulty. Cardiovascular Egelston, Lahari R. ([id number]) 1+ pitting edema of the bilateral lower extremities. Psychiatric this patient is able to make decisions and demonstrates good insight into disease process. Alert and Oriented x 3. pleasant and cooperative. General Notes: Patient's wound did require sharp debridement today. Post debridement the wound beds all appear to be doing better I was able to remove all the residual dressing material which was adhered to the wound margin and overall I feel like she's making great progress. There is no evidence of infection. Integumentary (Hair, Skin) Wound #2 status is Open. Original cause of wound was Gradually Appeared. The wound is located  on the Right,Proximal,Lateral Lower Leg. The wound measures 2.4cm length x 2.3cm width x 0.1cm depth; 4.335cm^2 area and 0.434cm^3 volume. There is Fat Layer (Subcutaneous Tissue) Exposed exposed. There is no tunneling or undermining noted. There is a medium amount of serous drainage noted. The wound margin is distinct with the outline attached to the wound base. There is large (67-100%) red granulation within the wound bed. There is a small  (1-33%) amount of necrotic tissue within the wound bed including Adherent Slough. The periwound skin appearance did not exhibit: Callus, Crepitus, Excoriation, Induration, Rash, Scarring, Dry/Scaly, Maceration, Atrophie Blanche, Cyanosis, Ecchymosis, Hemosiderin Staining, Mottled, Pallor, Rubor, Erythema. Wound #4 status is Open. Original cause of wound was Gradually Appeared. The wound is located on the Right,Distal,Lateral Lower Leg. The wound measures 0.5cm length x 0.4cm width x 0.1cm depth; 0.157cm^2 area and 0.016cm^3 volume. There is Fat Layer (Subcutaneous Tissue) Exposed exposed. There is no tunneling or undermining noted. There is a medium amount of serous drainage noted. The wound margin is flat and intact. There is large (67-100%) red granulation within the wound bed. There is a small (1-33%) amount of necrotic tissue within the wound bed including Adherent Slough. The periwound skin appearance did not exhibit: Callus, Crepitus, Excoriation, Induration, Rash, Scarring, Dry/Scaly, Maceration, Atrophie Blanche, Cyanosis, Ecchymosis, Hemosiderin Staining, Mottled, Pallor, Rubor, Erythema. Periwound temperature was noted as No Abnormality. Wound #5 status is Open. Original cause of wound was Gradually Appeared. The wound is located on the Left,Anterior Lower Leg. The wound measures 1.2cm length x 1.7cm width x 0.1cm depth; 1.602cm^2 area and 0.16cm^3 volume. There is Fat Layer (Subcutaneous Tissue) Exposed exposed. There is no tunneling or undermining noted. There is a medium amount of serous drainage noted. The wound margin is distinct with the outline attached to the wound base. There is large (67-100%) red granulation within the wound bed. There is no necrotic tissue within the wound bed. The periwound skin appearance did not exhibit: Callus, Crepitus, Excoriation, Induration, Rash, Scarring, Dry/Scaly, Maceration, Atrophie Blanche, Cyanosis, Ecchymosis, Hemosiderin Staining, Mottled,  Pallor, Rubor, Erythema. Periwound temperature was noted as No Abnormality. 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 ([id number]) Wound #2 Pre-procedure diagnosis of Wound #2 is a Lymphedema located on the Right,Proximal,Lateral Lower Leg . There was a Excisional Skin/Subcutaneous Tissue Debridement with a total area of 4 sq cm performed by STONE III, HOYT E., PA-C. With the following instrument(s): Curette to remove Viable and Non-Viable tissue/material. Material removed includes Subcutaneous Tissue, Slough, and Biofilm after achieving pain control using Other (lidocaine 4%). No specimens were taken. A time out was conducted at 11:52, prior to the start of the procedure. A Minimum amount of bleeding was controlled with Pressure. The procedure was tolerated well with a pain level of 0 throughout and a pain level of 0 following the procedure. Patient s Level of Consciousness post procedure was recorded as Awake and Alert. Post Debridement Measurements: 2.4cm length x 2.3cm width x 0.2cm depth; 0.867cm^3 volume. Character of Wound/Ulcer Post Debridement is stable. Post procedure Diagnosis Wound #2: Same as Pre-Procedure Wound #4 Pre-procedure diagnosis of Wound #4 is a Lymphedema located on the Right,Distal,Lateral Lower Leg . There was a Excisional Skin/Subcutaneous Tissue Debridement with a total area of 0.2 sq cm  performed by STONE III, HOYT E., PA-C. With the following instrument(s): Curette to remove Viable and Non-Viable tissue/material. Material removed includes Subcutaneous Tissue, Slough, and Biofilm after achieving pain control using Other (lidocaine 4%). No specimens were  taken. A time out was conducted at 11:52, prior to the start of the procedure. A Minimum amount of bleeding was controlled with Pressure. The procedure was tolerated well with a pain level of 0 throughout and a pain level of 0 following the procedure. Patient s Level of Consciousness post procedure was recorded as Awake and Alert. Post Debridement Measurements: 0.5cm length x 0.4cm width x 0.2cm depth; 0.031cm^3 volume. Character of Wound/Ulcer Post Debridement is stable. Post procedure Diagnosis Wound #4: Same as Pre-Procedure Plan Wound Cleansing: Wound #2 Right,Proximal,Lateral Lower Leg: Clean wound with Normal Saline. Cleanse wound with mild soap and water Wound #4 Right,Distal,Lateral Lower Leg: Clean wound with Normal Saline. Cleanse wound with mild soap and water Wound #5 Left,Anterior Lower Leg: Clean wound with Normal Saline. Cleanse wound with mild soap and water Anesthetic (add to Medication List): Wound #2 Right,Proximal,Lateral Lower Leg: Topical Lidocaine 4% cream applied to wound bed prior to debridement (In Clinic Only). Wound #4 Right,Distal,Lateral Lower Leg: Topical Lidocaine 4% cream applied to wound bed prior to debridement (In Clinic Only). Wound #5 Left,Anterior Lower Leg: Topical Lidocaine 4% cream applied to wound bed prior to debridement (In Clinic Only). Skin Barriers/Peri-Wound Care: Wound #2 Right,Proximal,Lateral Lower Leg: Moisturizing lotion Wound #4 Right,Distal,Lateral Lower Leg: Moisturizing lotion Wound #5 Left,Anterior Lower Leg: Moisturizing lotion Primary Wound Dressing: Wound #2 Right,Proximal,Lateral Lower Leg: Stigall, Kemani R. ([id number]) Hydrafera Blue Ready Transfer Wound #4 Right,Distal,Lateral Lower Leg: Hydrafera Blue Ready Transfer Wound #5 Left,Anterior Lower Leg: Hydrafera Blue Ready Transfer Secondary Dressing: Wound #2 Right,Proximal,Lateral Lower Leg: ABD pad Wound #4 Right,Distal,Lateral Lower Leg: ABD pad Wound #5  Left,Anterior Lower Leg: ABD pad Dressing Change Frequency: Wound #2 Right,Proximal,Lateral Lower Leg: Other: - twice weekly, return for nurse visit friday Wound #4 Right,Distal,Lateral Lower Leg: Other: - twice weekly, return for nurse visit friday Wound #5 Left,Anterior Lower Leg: Other: - twice weekly, return for nurse visit friday Follow-up Appointments: Wound #2 Right,Proximal,Lateral Lower Leg: Return Appointment in 1 week. Nurse Visit as needed Other: - twice weekly clinic visit, return on friday for nurse visit Wound #4 Right,Distal,Lateral Lower Leg: Return Appointment in 1 week. Nurse Visit as needed Other: - twice weekly clinic visit, return on friday for nurse visit Wound #5 Left,Anterior Lower Leg: Nurse Visit as needed Other: - twice weekly clinic visit, return on friday for nurse visit Edema Control: Wound #2 Right,Proximal,Lateral Lower Leg: 4 Layer Compression System - Bilateral - unna to anchor Compression Pump: Use compression pump on left lower extremity for 30 minutes, twice daily. - one hour use Compression Pump: Use compression pump on right lower extremity for 30 minutes, twice daily. - one hour use Wound #4 Right,Distal,Lateral Lower Leg: 4 Layer Compression System - Bilateral - unna to anchor Compression Pump: Use compression pump on left lower extremity for 30 minutes, twice daily. - one hour use Compression Pump: Use compression pump on right lower extremity for 30 minutes, twice daily. - one hour use Wound #5 Left,Anterior Lower Leg: 4 Layer Compression System - Bilateral - unna to anchor Compression Pump: Use compression pump on left lower extremity for 30 minutes, twice daily. - one hour use Compression Pump: Use compression pump on right lower extremity for 30 minutes, twice daily. - one hour use Additional Orders /  Instructions: Vitamin A; Vitamin C, Zinc Increase protein intake. I am going to recommend that we continue with the Current wound care  measures for the next week. The patient is in agreement with this plan. We will subsequently see were things stand at follow-up. Please see above for specific wound care orders. We will see patient for re-evaluation in 1 week(s) here in the clinic. If anything worsens or changes patient will contact our office for additional recommendations. Electronic Signature(s) [name], [name] ([id number]) Signed: 05/13/2018 11:25:57 PM By: Lenda Kelp PA-C Entered By: Lenda Kelp on 05/13/2018 17:05:05 [name] ([id number]) -------------------------------------------------------------------------------- ROS/PFSH Details Patient Name: Jessica Casey R. Date of Service: 05/13/2018 11:15 AM Medical Record Number: [id number] Patient Account Number: [id number] Date of Birth/Sex: 01-01-1981 (37 y.o. F) Treating RN: Renne Crigler Primary Care Provider: PATIENT, NO Other Clinician: Referring Provider: Referral, Self Treating Provider/Extender: STONE III, HOYT Weeks in Treatment: 35 Information Obtained From Patient Wound History Do you currently have one or more open woundso Yes How many open wounds do you currently haveo 1 Approximately how long have you had your woundso 1 month How have you been treating your wound(s) until nowo aquacel ag Has your wound(s) ever healed and then re-openedo No Have you had any lab work done in the past montho No Have you tested positive for an antibiotic resistant organism (MRSA, VRE)o No Have you tested positive for osteomyelitis (bone infection)o No Have you had any tests for circulation on your legso Yes Who ordered the testo [facility] Where was the test doneo [facility] Constitutional Symptoms (General Health) Complaints and Symptoms: Negative for: Fever; Chills Cardiovascular Complaints and Symptoms: Positive for: LE edema Medical History: Positive for: Hypertension Negative for: Angina; Arrhythmia; Congestive Heart Failure; Coronary Artery Disease; Deep  Vein Thrombosis; Hypotension; Myocardial Infarction; Peripheral Arterial Disease; Peripheral Venous Disease; Phlebitis; Vasculitis Hematologic/Lymphatic Medical History: Positive for: Lymphedema Respiratory Complaints and Symptoms: No Complaints or Symptoms Medical History: Negative for: Aspiration; Asthma; Chronic Obstructive Pulmonary Disease (COPD); Pneumothorax; Sleep Apnea; Tuberculosis Psychiatric Complaints and Symptoms: No Complaints or Symptoms Cosens, Chiquitta R. ([id number]) Immunizations Pneumococcal Vaccine: Received Pneumococcal Vaccination: No Immunization Notes: up to date Implantable Devices Family and Social History Cancer: No; Diabetes: No; Heart Disease: No; Hereditary Spherocytosis: No; Hypertension: No; Kidney Disease: Yes - Mother; Lung Disease: No; Seizures: No; Stroke: No; Thyroid Problems: No; Tuberculosis: No; Never smoker; Marital Status - Married; Alcohol Use: Never; Drug Use: No History; Caffeine Use: Daily; Financial Concerns: No; Food, Clothing or Shelter Needs: No; Support System Lacking: No; Transportation Concerns: No; Advanced Directives: No; Patient does not want information on Advanced Directives Physician Affirmation I have reviewed and agree with the above information. Electronic Signature(s) Signed: 05/13/2018 11:25:57 PM By: Lenda Kelp PA-C Signed: 05/14/2018 4:47:28 PM By: Renne Crigler Entered By: Lenda Kelp on 05/13/2018 17:01:52 Blew, [name] ([id number]) -------------------------------------------------------------------------------- SuperBill Details Patient Name: Jessica Casey R. Date of Service: 05/13/2018 Medical Record Number: [id number] Patient Account Number: [id number] Date of Birth/Sex: 03-09-1981 (37 y.o. F) Treating RN: Renne Crigler Primary Care Provider: PATIENT, NO Other Clinician: Referring Provider: Referral, Self Treating Provider/Extender: STONE III, HOYT Weeks in Treatment: 35 Diagnosis Coding ICD-10  Codes Code Description [phone] 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d L97.221 Non-pressure chronic ulcer of left calf limited to breakdown of skin I89.0 Lymphedema, not elsewhere classified I87.311 Chronic venous hypertension (idiopathic) with ulcer of right lower extremity E66.01 Morbid (severe) obesity due to excess calories Facility Procedures CPT4 Code:  16109604 Description: 99214 - WOUND CARE VISIT-LEV 4 EST PT Modifier: Quantity: 1 CPT4 Code: [id number] Description: 11042 - DEB SUBQ TISSUE 20 SQ CM/< ICD-10 Diagnosis Description L97.212 Non-pressure chronic ulcer of right calf with fat layer expo Modifier: sed Quantity: 1 Physician Procedures CPT4 Code: 1478295 Description: 11042 - WC PHYS SUBQ TISS 20 SQ CM ICD-10 Diagnosis Description L97.212 Non-pressure chronic ulcer of right calf with fat layer expo Modifier: sed Quantity: 1 Electronic Signature(s) Signed: 05/13/2018 11:50:23 PM By: Lenda Kelp PA-C Entered By: Lenda Kelp on 05/13/2018 23:42:06

## 2018-05-20 ENCOUNTER — Ambulatory Visit: Payer: Self-pay | Admitting: Physician Assistant

## 2018-05-21 ENCOUNTER — Encounter: Payer: Self-pay | Attending: Physician Assistant | Admitting: Physician Assistant

## 2018-05-21 DIAGNOSIS — I87311 Chronic venous hypertension (idiopathic) with ulcer of right lower extremity: Secondary | ICD-10-CM | POA: Insufficient documentation

## 2018-05-21 DIAGNOSIS — I890 Lymphedema, not elsewhere classified: Secondary | ICD-10-CM | POA: Insufficient documentation

## 2018-05-21 DIAGNOSIS — L97221 Non-pressure chronic ulcer of left calf limited to breakdown of skin: Secondary | ICD-10-CM | POA: Insufficient documentation

## 2018-05-21 DIAGNOSIS — L97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d: Secondary | ICD-10-CM | POA: Insufficient documentation

## 2018-05-21 DIAGNOSIS — Z6841 Body Mass Index (BMI) 40.0 and over, adult: Secondary | ICD-10-CM | POA: Insufficient documentation

## 2018-05-21 NOTE — Progress Notes (Addendum)
TREMEKA, HELBLING ([id number]) Visit Report for 05/21/2018 Arrival Information Details Patient Name: [name], [name] Date of Service: 05/21/2018 10:00 AM Medical Record Number: [id number] Patient Account Number: [id number] Date of Birth/Sex: [birth year] (37 y.o. F) Treating RN: Curtis Sites Primary Care Alyria Krack: PATIENT, NO Other Clinician: Referring Charon Smedberg: Referral, Self Treating Darianny Momon/Extender: STONE III, HOYT Weeks in Treatment: 36 Visit Information History Since Last Visit Added or deleted any medications: No Patient Arrived: Ambulatory Any new allergies or adverse reactions: No Arrival Time: 10:13 Had a fall or experienced change in No Accompanied By: self activities of daily living that may affect Transfer Assistance: None risk of falls: Patient Identification Verified: Yes Signs or symptoms of abuse/neglect since last visito No Secondary Verification Process Yes Hospitalized since last visit: No Completed: Implantable device outside of the clinic excluding No Patient Requires Transmission-Based No cellular tissue based products placed in the center Precautions: since last visit: Patient Has Alerts: Yes Has Dressing in Place as Prescribed: Yes Patient Alerts: ABI 05/05/18 L 1.03 R Has Compression in Place as Prescribed: Yes 1.03 Pain Present Now: No TBI 05/05/18 L .88 R .73 Electronic Signature(s) Signed: 05/21/2018 4:10:25 PM By: Curtis Sites Previous Signature: 05/21/2018 10:02:34 AM Version By: Curtis Sites Entered By: Curtis Sites on 05/21/2018 10:15:35 Binstock, Cristal Deer ([id number]) -------------------------------------------------------------------------------- Encounter Discharge Information Details Patient Name: Jessica Casey R. Date of Service: 05/21/2018 10:00 AM Medical Record Number: [id number] Patient Account Number: [id number] Date of Birth/Sex: Apr 26, 1981 (37 y.o. F) Treating RN: Renne Crigler Primary Care Makahla Kiser: PATIENT, NO Other  Clinician: Referring Sylvie Mifsud: Referral, Self Treating Anan Dapolito/Extender: STONE III, HOYT Weeks in Treatment: 36 Encounter Discharge Information Items Discharge Condition: Stable Ambulatory Status: Ambulatory Discharge Destination: Home Transportation: Private Auto Schedule Follow-up Appointment: Yes Clinical Summary of Care: Electronic Signature(s) Signed: 05/22/2018 3:34:51 PM By: Renne Crigler Entered By: Renne Crigler on 05/21/2018 11:00:33 [name], [name] ([id number]) -------------------------------------------------------------------------------- Lower Extremity Assessment Details Patient Name: Jessica Casey R. Date of Service: 05/21/2018 10:00 AM Medical Record Number: [id number] Patient Account Number: [id number] Date of Birth/Sex: 04-30-1981 (37 y.o. F) Treating RN: Curtis Sites Primary Care Latrece Nitta: PATIENT, NO Other Clinician: Referring Arwyn Besaw: Referral, Self Treating Judithann Villamar/Extender: STONE III, HOYT Weeks in Treatment: 36 Edema Assessment Assessed: [Left: No] [Right: No] [Left: Edema] [Right: :] Calf Left: Right: Point of Measurement: 36 cm From Medial Instep 58.7 cm 62.8 cm Ankle Left: Right: Point of Measurement: 9 cm From Medial Instep 36.5 cm 38.6 cm Vascular Assessment Pulses: Dorsalis Pedis Palpable: [Left:Yes] [Right:Yes] Posterior Tibial Extremity colors, hair growth, and conditions: Extremity Color: [Left:Hyperpigmented] [Right:Hyperpigmented] Hair Growth on Extremity: [Left:No] [Right:No] Temperature of Extremity: [Left:Warm] [Right:Warm] Capillary Refill: [Left:< 3 seconds] [Right:< 3 seconds] Toe Nail Assessment Left: Right: Thick: Yes Yes Discolored: Yes Yes Deformed: Yes Yes Improper Length and Hygiene: Yes Yes Electronic Signature(s) Signed: 05/21/2018 4:10:25 PM By: Curtis Sites Entered By: Curtis Sites on 05/21/2018 10:24:35 [name], [name]  ([id number]) -------------------------------------------------------------------------------- Multi Wound Chart Details Patient Name: Jessica Casey R. Date of Service: 05/21/2018 10:00 AM Medical Record Number: [id number] Patient Account Number: [id number] Date of Birth/Sex: 12-14-1980 (37 y.o. F) Treating RN: Phillis Haggis Primary Care Kelvin Sennett: PATIENT, NO Other Clinician: Referring Lawrnce Reyez: Referral, Self Treating Edinson Domeier/Extender: STONE III, HOYT Weeks in Treatment: 36 Vital Signs Height(in): 74 Pulse(bpm): 103 Weight(lbs): 486 Blood Pressure(mmHg): 138/87 Body Mass Index(BMI): 62 Temperature(F): 98.4 Respiratory Rate 20 (breaths/min): Photos: [2:No Photos] [4:No Photos] [5:No Photos] Wound Location: [2:Right, Proximal, Lateral Lower Leg] [4:Right, Distal, Lateral Lower Leg] [5:Left, Anterior Lower Leg] Wounding Event: [2:Gradually  Appeared] [4:Gradually Appeared] [5:Gradually Appeared] Primary Etiology: [2:Lymphedema] [4:Lymphedema] [5:Lymphedema] Date Acquired: [2:08/08/2017] [4:04/07/2018] [5:04/07/2018] Weeks of Treatment: [2:36] [4:6] [5:6] Wound Status: [2:Open] [4:Open] [5:Open] Clustered Wound: [2:Yes] [4:No] [5:No] Measurements L x W x D [2:4x2.1x0.1] [4:0.1x0.1x0.1] [5:0.9x0.9x0.1] (cm) Area (cm) : [2:6.597] [4:0.008] [5:0.636] Volume (cm) : [2:0.66] [4:0.001] [5:0.064] % Reduction in Area: [2:69.20%] [4:99.90%] [5:91.70%] % Reduction in Volume: [2:89.70%] [4:99.90%] [5:98.30%] Classification: [2:Full Thickness With Exposed Support Structures] [4:Partial Thickness] [5:Full Thickness Without Exposed Support Structures] Periwound Skin Texture: [2:No Abnormalities Noted] [4:No Abnormalities Noted] [5:No Abnormalities Noted] Periwound Skin Moisture: [2:No Abnormalities Noted] [4:No Abnormalities Noted] [5:No Abnormalities Noted] Periwound Skin Color: [2:No Abnormalities Noted No] [4:No Abnormalities Noted No] [5:No Abnormalities Noted No] Treatment Notes Electronic  Signature(s) Signed: 05/21/2018 4:10:39 PM By: [name] Entered By: [name] on 05/21/2018 10:36:13 [name] ([id number]) -------------------------------------------------------------------------------- Multi-Disciplinary Care Plan Details Patient Name: Jessica Casey R. Date of Service: 05/21/2018 10:00 AM Medical Record Number: [id number] Patient Account Number: [id number] Date of Birth/Sex: 1981-04-15 (37 y.o. F) Treating RN: Phillis Haggis Primary Care Symone Cornman: PATIENT, NO Other Clinician: Referring Cassondra Stachowski: Referral, Self Treating Sana Tessmer/Extender: STONE III, HOYT Weeks in Treatment: 36 Active Inactive ` Orientation to the Wound Care Program Nursing Diagnoses: Knowledge deficit related to the wound healing center program Goals: Patient/caregiver will verbalize understanding of the Wound Healing Center Program Date Initiated: 09/10/2017 Target Resolution Date: 11/23/2017 Goal Status: Active Interventions: Provide education on orientation to the wound center Notes: ` Venous Leg Ulcer Nursing Diagnoses: Knowledge deficit related to disease process and management Goals: Patient/caregiver will verbalize understanding of disease process and disease management Date Initiated: 09/10/2017 Target Resolution Date: 11/23/2017 Goal Status: Active Interventions: Assess peripheral edema status every visit. Notes: ` Wound/Skin Impairment Nursing Diagnoses: Impaired tissue integrity Goals: Ulcer/skin breakdown will heal within 14 weeks Date Initiated: 09/10/2017 Target Resolution Date: 11/24/2017 Goal Status: Active Interventions: LILIANAH, BUFFIN ([id number]) Assess patient/caregiver ability to obtain necessary supplies Assess patient/caregiver ability to perform ulcer/skin care regimen upon admission and as needed Assess ulceration(s) every visit Notes: Electronic Signature(s) Signed: 05/21/2018 4:10:39 PM By: [name] Entered By: [name] on  05/21/2018 10:35:59 [name], [name] ([id number]) -------------------------------------------------------------------------------- Pain Assessment Details Patient Name: Jessica Casey R. Date of Service: 05/21/2018 10:00 AM Medical Record Number: [id number] Patient Account Number: [id number] Date of Birth/Sex: October 29, 1981 (37 y.o. F) Treating RN: Curtis Sites Primary Care Wille Aubuchon: PATIENT, NO Other Clinician: Referring Elon Lomeli: Referral, Self Treating Garreth Burnsworth/Extender: STONE III, HOYT Weeks in Treatment: 36 Active Problems Location of Pain Severity and Description of Pain Patient Has Paino No Site Locations Pain Management and Medication Current Pain Management: Electronic Signature(s) Signed: 05/21/2018 4:10:25 PM By: Curtis Sites Entered By: Curtis Sites on 05/21/2018 10:15:42 [name] ([id number]) -------------------------------------------------------------------------------- Patient/Caregiver Education Details Patient Name: Jessica Casey R. Date of Service: 05/21/2018 10:00 AM Medical Record Number: [id number] Patient Account Number: [id number] Date of Birth/Gender: Mar 01, 1981 (37 y.o. F) Treating RN: Renne Crigler Primary Care Physician: PATIENT, NO Other Clinician: Referring Physician: Referral, Self Treating Physician/Extender: Linwood Dibbles, HOYT Weeks in Treatment: 44 Education Assessment Education Provided To: Patient Education Topics Provided Wound Debridement: Handouts: Wound Debridement Methods: Explain/Verbal Responses: State content correctly Wound/Skin Impairment: Handouts: Caring for Your Ulcer Methods: Explain/Verbal Responses: State content correctly Electronic Signature(s) Signed: 05/22/2018 3:34:51 PM By: Renne Crigler Entered By: Renne Crigler on 05/21/2018 11:00:50 [name], [name] ([id number]) -------------------------------------------------------------------------------- Wound Assessment Details Patient Name: Jessica Casey R. Date of  Service: 05/21/2018 10:00 AM Medical Record Number: [id number] Patient Account Number: [id number] Date of  Birth/Sex: [birth year] (37 y.o. F) Treating RN: Curtis Sites Primary Care Vinay Ertl: PATIENT, NO Other Clinician: Referring Evianna Chandran: Referral, Self Treating Miquel Stacks/Extender: STONE III, HOYT Weeks in Treatment: 36 Wound Status Wound Number: 2 Primary Etiology: Lymphedema Wound Location: Right, Proximal, Lateral Lower Leg Wound Status: Open Wounding Event: Gradually Appeared Date Acquired: 08/08/2017 Weeks Of Treatment: 36 Clustered Wound: Yes Photos Photo Uploaded By: Curtis Sites on 05/21/2018 13:02:33 Wound Measurements Length: (cm) 4 Width: (cm) 2.1 Depth: (cm) 0.1 Area: (cm) 6.597 Volume: (cm) 0.66 % Reduction in Area: 69.2% % Reduction in Volume: 89.7% Wound Description Full Thickness With Exposed Support Classification: Structures Periwound Skin Texture Texture Color No Abnormalities Noted: No No Abnormalities Noted: No Moisture No Abnormalities Noted: No Treatment Notes Wound #2 (Right, Proximal, Lateral Lower Leg) 1. Cleansed with: Clean wound with Normal Saline 2. Anesthetic Topical Lidocaine 4% cream to wound bed prior to debridement [name], [name] ([id number]) 3. Peri-wound Care: Moisturizing lotion 4. Dressing Applied: Hydrafera Blue 5. Secondary Dressing Applied ABD Pad 7. Secured with 4 Layer Compression System - Bilateral Electronic Signature(s) Signed: 05/21/2018 4:10:25 PM By: Curtis Sites Entered By: Curtis Sites on 05/21/2018 10:30:30 [name] ([id number]) -------------------------------------------------------------------------------- Wound Assessment Details Patient Name: Jessica Casey R. Date of Service: 05/21/2018 10:00 AM Medical Record Number: [id number] Patient Account Number: [id number] Date of Birth/Sex: 16-Feb-1981 (37 y.o. F) Treating RN: Curtis Sites Primary Care Eden Toohey: PATIENT, NO Other Clinician: Referring  Norma Ignasiak: Referral, Self Treating Malessa Zartman/Extender: STONE III, HOYT Weeks in Treatment: 36 Wound Status Wound Number: 4 Primary Etiology: Lymphedema Wound Location: Right, Distal, Lateral Lower Leg Wound Status: Open Wounding Event: Gradually Appeared Date Acquired: 04/07/2018 Weeks Of Treatment: 6 Clustered Wound: No Photos Photo Uploaded By: Curtis Sites on 05/21/2018 13:02:59 Wound Measurements Length: (cm) 0.1 Width: (cm) 0.1 Depth: (cm) 0.1 Area: (cm) 0.008 Volume: (cm) 0.001 % Reduction in Area: 99.9% % Reduction in Volume: 99.9% Wound Description Classification: Partial Thickness Periwound Skin Texture Texture Color No Abnormalities Noted: No No Abnormalities Noted: No Moisture No Abnormalities Noted: No Treatment Notes Wound #4 (Right, Distal, Lateral Lower Leg) 1. Cleansed with: Clean wound with Normal Saline 2. Anesthetic Topical Lidocaine 4% cream to wound bed prior to debridement 3. Peri-wound Care: [name], [name] ([id number]) Moisturizing lotion 4. Dressing Applied: Hydrafera Blue 5. Secondary Dressing Applied ABD Pad 7. Secured with 4 Layer Compression System - Bilateral Electronic Signature(s) Signed: 05/21/2018 4:10:25 PM By: Curtis Sites Entered By: Curtis Sites on 05/21/2018 10:30:30 [name] ([id number]) -------------------------------------------------------------------------------- Wound Assessment Details Patient Name: Jessica Casey R. Date of Service: 05/21/2018 10:00 AM Medical Record Number: [id number] Patient Account Number: [id number] Date of Birth/Sex: 1981/08/31 (37 y.o. F) Treating RN: Curtis Sites Primary Care Jeslynn Hollander: PATIENT, NO Other Clinician: Referring Justice Aguirre: Referral, Self Treating Hedy Garro/Extender: STONE III, HOYT Weeks in Treatment: 36 Wound Status Wound Number: 5 Primary Etiology: Lymphedema Wound Location: Left, Anterior Lower Leg Wound Status: Open Wounding Event: Gradually Appeared Date  Acquired: 04/07/2018 Weeks Of Treatment: 6 Clustered Wound: No Photos Photo Uploaded By: Curtis Sites on 05/21/2018 13:03:00 Wound Measurements Length: (cm) 0.9 Width: (cm) 0.9 Depth: (cm) 0.1 Area: (cm) 0.636 Volume: (cm) 0.064 % Reduction in Area: 91.7% % Reduction in Volume: 98.3% Wound Description Full Thickness Without Exposed Support Classification: Structures Periwound Skin Texture Texture Color No Abnormalities Noted: No No Abnormalities Noted: No Moisture No Abnormalities Noted: No Treatment Notes Wound #5 (Left, Anterior Lower Leg) 1. Cleansed with: Clean wound with Normal Saline 2. Anesthetic Topical Lidocaine 4% cream to wound bed  prior to debridement [name], [name] ([id number]) 3. Peri-wound Care: Moisturizing lotion 4. Dressing Applied: Hydrafera Blue 5. Secondary Dressing Applied ABD Pad 7. Secured with 4 Layer Compression System - Bilateral Electronic Signature(s) Signed: 05/21/2018 4:10:25 PM By: Curtis Sitesorthy, Joanna Entered By: Curtis Sitesorthy, Joanna on 05/21/2018 10:30:30 [name], [name] ([id number]) -------------------------------------------------------------------------------- Vitals Details Patient Name: [name], [name] Date of Service: 05/21/2018 10:00 AM Medical Record Number: [id number] Patient Account Number: [id number] Date of Birth/Sex: 1981/10/20 (37 y.o. F) Treating RN: Curtis Sitesorthy, Joanna Primary Care Jenette Rayson: PATIENT, NO Other Clinician: Referring Zyona Pettaway: Referral, Self Treating Garret Teale/Extender: STONE III, HOYT Weeks in Treatment: 36 Vital Signs Time Taken: 10:15 Temperature (F): 98.4 Height (in): 74 Pulse (bpm): 103 Weight (lbs): 486 Respiratory Rate (breaths/min): 20 Body Mass Index (BMI): 62.4 Blood Pressure (mmHg): 138/87 Reference Range: 80 - 120 mg / dl Electronic Signature(s) Signed: 05/21/2018 4:10:25 PM By: Curtis Sitesorthy, Joanna Entered By: Curtis Sitesorthy, Joanna on 05/21/2018 10:16:03

## 2018-05-22 NOTE — Progress Notes (Signed)
AWILDA, COVIN ([id number]) Visit Report for 05/21/2018 Chief Complaint Document Details Patient Name: [name], [name] Date of Service: 05/21/2018 10:00 AM Medical Record Number: [id number] Patient Account Number: [id number] Date of Birth/Sex: 03-Jun-1981 (37 y.o. F) Treating RN: Phillis Haggis Primary Care Provider: PATIENT, NO Other Clinician: Referring Provider: Referral, Self Treating Provider/Extender: STONE III, HOYT Weeks in Treatment: 36 Information Obtained from: Patient Chief Complaint She is here in follow up evaluation of right lower extremity ulcers Electronic Signature(s) Signed: 05/22/2018 1:22:53 AM By: Lenda Kelp PA-C Entered By: Lenda Kelp on 05/21/2018 10:46:20 [name], [name] ([id number]) -------------------------------------------------------------------------------- Debridement Details Patient Name: Jessica Casey R. Date of Service: 05/21/2018 10:00 AM Medical Record Number: [id number] Patient Account Number: [id number] Date of Birth/Sex: 01-23-1981 (37 y.o. F) Treating RN: Phillis Haggis Primary Care Provider: PATIENT, NO Other Clinician: Referring Provider: Referral, Self Treating Provider/Extender: STONE III, HOYT Weeks in Treatment: 36 Debridement Performed for Wound #2 Right,Proximal,Lateral Lower Leg Assessment: Performed By: Physician STONE III, HOYT E., PA-C Debridement Type: Debridement Pre-procedure Verification/Time Yes - 10:35 Out Taken: Start Time: 10:35 Pain Control: Lidocaine 4% Topical Solution Total Area Debrided (L x W): 4 (cm) x 2.1 (cm) = 8.4 (cm) Tissue and other material Viable, Non-Viable, Slough, Subcutaneous, Fibrin/Exudate, Slough debrided: Level: Skin/Subcutaneous Tissue Debridement Description: Excisional Instrument: Curette Bleeding: Minimum Hemostasis Achieved: Pressure End Time: 10:37 Procedural Pain: 0 Post Procedural Pain: 0 Response to Treatment: Procedure was tolerated well Level of Consciousness: Awake and  Alert Post Procedure Vitals: Temperature: 98.4 Pulse: 103 Respiratory Rate: 20 Blood Pressure: Systolic Blood Pressure: 138 Diastolic Blood Pressure: 87 Post Debridement Measurements of Total Wound Length: (cm) 4 Width: (cm) 2.1 Depth: (cm) 0.2 Volume: (cm) 1.319 Character of Wound/Ulcer Post Debridement: Requires Further Debridement Post Procedure Diagnosis Same as Pre-procedure Electronic Signature(s) Signed: 05/21/2018 4:10:39 PM By: [name] Signed: 05/22/2018 1:22:53 AM By: Lenda Kelp PA-C Entered By: [name] on 05/21/2018 10:39:48 [name], [name] ([id number]) -------------------------------------------------------------------------------- Debridement Details Patient Name: Jessica Casey R. Date of Service: 05/21/2018 10:00 AM Medical Record Number: [id number] Patient Account Number: [id number] Date of Birth/Sex: 06-08-1981 (37 y.o. F) Treating RN: Phillis Haggis Primary Care Provider: PATIENT, NO Other Clinician: Referring Provider: Referral, Self Treating Provider/Extender: STONE III, HOYT Weeks in Treatment: 36 Debridement Performed for Wound #4 Right,Distal,Lateral Lower Leg Assessment: Performed By: Physician STONE III, HOYT E., PA-C Debridement Type: Debridement Pre-procedure Verification/Time Yes - 10:35 Out Taken: Start Time: 10:38 Pain Control: Lidocaine 4% Topical Solution Total Area Debrided (L x W): 0.1 (cm) x 0.1 (cm) = 0.01 (cm) Tissue and other material Viable, Non-Viable, Slough, Subcutaneous, Fibrin/Exudate, Slough debrided: Level: Skin/Subcutaneous Tissue Debridement Description: Excisional Instrument: Curette Bleeding: Minimum Hemostasis Achieved: Pressure End Time: 10:40 Procedural Pain: 0 Post Procedural Pain: 0 Response to Treatment: Procedure was tolerated well Level of Consciousness: Awake and Alert Post Procedure Vitals: Temperature: 98.4 Pulse: 103 Respiratory Rate: 20 Blood Pressure: Systolic Blood Pressure:  138 Diastolic Blood Pressure: 87 Post Debridement Measurements of Total Wound Length: (cm) 0.3 Width: (cm) 0.3 Depth: (cm) 0.2 Volume: (cm) 0.014 Character of Wound/Ulcer Post Debridement: Requires Further Debridement Post Procedure Diagnosis Same as Pre-procedure Electronic Signature(s) Signed: 05/21/2018 4:10:39 PM By: [name] Signed: 05/22/2018 1:22:53 AM By: Lenda Kelp PA-C Entered By: [name] on 05/21/2018 10:40:40 [name], [name] ([id number]) -------------------------------------------------------------------------------- HPI Details Patient Name: Jessica Casey R. Date of Service: 05/21/2018 10:00 AM Medical Record Number: [id number] Patient Account Number: [id number] Date of Birth/Sex: December 19, 1980 (37 y.o. F) Treating RN: Phillis Haggis  Primary Care Provider: PATIENT, NO Other Clinician: Referring Provider: Referral, Self Treating Provider/Extender: STONE III, HOYT Weeks in Treatment: 36 History of Present Illness HPI Description: 37 year old patient well known to our [facility] wound care clinic where she has been seen since 2016 for bilateral lower extremity venous insufficiency disease with lymphedema and multiple ulcerations associated with morbid obesity. she had custom-made compression stockings and lymphedema pumps which were used in the past. most recently she was admitted to the hospital between October 11 and 09/02/2017 with sepsis, lower extremity wounds and lymphedema.she was initially treated in the outpatient with Keflex and Bactrim. she was initially treated in the hospital with vancomycin and Zosyn and changed over to Unasyn until her white count improved and her blood cultures were negative for 3 days. After her inpatient management she was discharged home on Augmentin to end on 09/13/2017 with a 14 day course. she has had outpatient vascular duplex scans completed in November 2017 and her right ABI was 1.1 and the left ABI is 1.3. she had  normal toe brachial indices bilaterally.she had three-vessel runoff in the right lower extremity and two-vessel runoff in the left lower extremity. On questioning the patient she does have custom made compression stockings and also has a lymphedema pump but has not been using it appropriately and has not been taking good care of herself. 09/17/2017 -- she returns today with compression stockings on the left side and the right side has had significant amount of drainage and has a very strong odor 09/24/2017 -- the drainage is increased significantly and she has more lymphedema and a very strong odor to her wound. Though she does not have systemic symptoms, or overt infection I believe she will benefit from some doxycycline given empirically. 10/01/2017 -- after starting the doxycycline and changing the dressing twice a week her symptoms and signs have definitely improved overall. 10/08/2017 -- she has completed her course of doxycycline but continues to have a lot of drainage and needs twice a week dressing changes. 11/08/17-she is here in follow-up evaluation for right lower extremity ulcers. She admits to using her lymphedema pumps twice daily, one hour per session. she is voicing no complaints or concerns, no signs of infection will change to [facility] 12/14/17 on evaluation today patient appears to be doing very well in regard to her wounds. She has been tolerating the dressing changes she continues to develop some portly the adherent granular tissue on the surface of the wound with some Slough. Obviously we are trying to get too much better wound bed. With that being said the hyper granulation the Hydrofera Blue Dressing to have helped with which is excellent news. However I think it may be time to try something a little bit different at this point. 01/11/18 on evaluation today patient appears to be doing fairly well in regard to her right lateral lower extremity ulcers. This shows excellent  signs of filling in which is great news. There does not appear to be any evidence of infection which is also good news. She does continue to work as well is good school. She is having no pain. 01/22/18 on evaluation today patient appears to be doing a little bit worse in my opinion in regard to the overall quality of that granulation on her right lower extremity. She was not here last week due to being sick with a stomach virus this may have something to do with the fact that her wound appears to be a little bit worse. With that  being said I'm also thinking that after switching from the [facility] Dressing to the silver collagen would really has not looked that's good in my opinion. We may want to swit 02/11/18 on evaluation today patient's right lower/lateral lower extremity ulcers appear to be doing very well at this point. Blue, [name] ([id number]) Especially the more proximal ulcer has filled in much closer to surface which is good news. Nonetheless both show signs of improvement which is great news. There does not appear to be any evidence of infection which is also good news. In general patient has been doing well tolerating the wraps as well as the [employer]. 02/18/18 on evaluation today patient appears to be doing a little bit worse in regard to the periwound region the wounds themselves do not look much deteriorated to me. With that being said she has several small blisters/pustules noted in the periwound and there was a significant amount of drainage and maceration compared to previous. There has been a time that we had to bring her back for twice a week dressing changes as far as her wrap was concerned it has been a while since we've done that however. With that being said the patient has been having some burning and in general I'm concerned about the possibility of infection. She has previously taken doxycycline with good result. Fortunately there does not appear to be  any evidence of overall worsening in regard to the size of the wound and in fact the upper wound actually appears to be showing signs of good epithelialization. 03/18/18 on evaluation today patient appears to be doing excellent in regard to her right lateral lower extremity ulcer. She has been tolerating the dressing changes without complication. Fortunately this seems to be making great progress. Overall I see no signs of infection and there is dramatic improvement overall even compared to last week. 03/28/18 on evaluation today patient appears to be doing very well in regard to her right lateral lower surety ulcer. She has been tolerating the dressing changes without complication at this point. She states currently that she's having no significant discomfort which is excellent as well. Overall I'm pleased with how things seem to be progressing. 04/01/18 on evaluation today patient appears to be doing excellent in regard to her right lateral lower extremity ulcers. She has been tolerating the dressing changes without complication which is good news. With that being said the wraps still continues to show signs of helping with her fluid she does have Juxta-Lite compression stockings for when we are done with the current treatment regimen once everything heals. Mainly she just has the one area still remaining the smaller of the two wings is pretty much closed at this point there's just a very slight opening noted. 04/08/18 on evaluation today patient appears to be doing better in regard to the original wound on the right lateral lower extremity that we have been managing. Unfortunately she has a new area of weeping more anterior on the right lateral lower extremity and on the left lower extremity she has two new ulcers there appears to be some cellulitis noted at this time. I am concerned about the fact that this may in fact be an infection that has caused the worsening and swelling in the past this  has been the case when we previously attempted to determine what was going on when she had down slides like this. With that being said the patient is seeming to tolerate the wraps fairly well for  the most part. 04/17/18; since last time the patient was seen in this clinic she was hospitalized from 04/11/18 through 04/13/18; she presented with bilateral lower extremity pain worse on the right and a fever of up to 104. Noteworthy that when she was in the clinic last week she had new wounds on the left leg culture grew MRSA and she was prescribed Bactrim. She had 2 days of IV Vanco and Zosyn in the hospital. Her blood cultures were negative. She was discharged on Bactrim to cover the original MRSA on the right leg and Keflex to cover the possibility of strep. The hospitalist had a conversation with infectious disease. The patient arrives in clinic today for nurse check however given the recent hospitalization I was asked to look at her. The patient states she feels a lot better. No fever or chills. Still some pain in the right calf but a lot better. She arrived in the hospital with a white count of 15.6, the next day was 10.8. Comprehensive metabolic panel was normal. She is still taking Keflex and Bactrim It would appear that she had a surgical IandD at the bedside of the right calf felt to have a underlying abscess. According to our intake nurse the wound has expanded quite bit on the right lateral calf. She has no open area on the left anterior and left posterior calf as described last time 04/23/18 on evaluation today patient actually appears to be doing much better than when I last saw her. This obviously has been a couple weeks ago and in the interim she was admitted to the hospital for IV antibiotic therapy due to cellulitis, discharge, and fortunately the substance which hadn't sued has completely resolved. Her swelling seems to be better in regard to her lower extremities as well and the wounds  that opened up as results of the infection seems to be showing signs of improvement. There is some Slough noted on the left lateral wound otherwise a lot of weeping noted of the right lateral leg. 04/29/18 on evaluation today patient appears to be doing excellent in regard to her bilateral lower extremity ulcers. She's been tolerating the dressing changes without complication there does not appear to be any evidence of infection at this time. Overall I think she is made great improvement and seems to be showing signs of coming back around where she was prior to the cellulitis and sepsis episode. JULIANNY, MILSTEIN ([id number]) 05/06/18 on evaluation today patient appears to be doing better in regard to her bilateral ocean the ulcers. She's been tolerating the dressing changes without complication. Fortunately there does not appear to be any evidence of infection at this time. Her swelling is significantly down overall seems to be showing signs of good improvement as well. Fortunately I do believe that she is progressing nicely back to where she was prior to the cellulitis/sepsis issue. 05/13/18 on evaluation today patient seems to be showing signs of great improvement she's been tolerating the dressing changes without complication and overall there does not appear to be any evidence of infection. All of which is good news. The only issue she really have today is that some of the [facility] Dressing actually was stuck to the periwound of the right lateral lower extremity however this was able to be removed during the debridement without complication. 05/21/18 on evaluation today patient appears to be doing very well in regard to her bilateral lower extremity ulcers. She has been tolerating the compression wraps without complication. There  does not appear to be any evidence of infection at this point which is good news as well. Overall I'm very pleased with the progress that has been made. She likewise is  very happy. 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 Electronic Signature(s) Signed: 05/22/2018 1:22:53 AM By: Lenda Kelp PA-C Entered By: Lenda Kelp on 05/21/2018 10:46:34 [name] ([id number]) -------------------------------------------------------------------------------- Physical Exam Details Patient Name: Jessica Casey R. Date of Service: 05/21/2018 10:00 AM Medical Record Number: [id number] Patient Account Number: [id number] Date of Birth/Sex: 1981-08-15 (37 y.o. F) Treating RN: Phillis Haggis Primary Care Provider: PATIENT, NO Other Clinician: Referring Provider: Referral, Self Treating Provider/Extender: STONE III, HOYT Weeks in Treatment: 36 Constitutional Obese and well-hydrated in no acute distress. Respiratory normal breathing without difficulty. Cardiovascular 2+ pitting edema of the bilateral lower extremities. Psychiatric this patient is able to make decisions and demonstrates good insight into disease process. Alert and Oriented x 3. pleasant and cooperative. Notes Patient's wound bed shows evidence of slough on the right lower extremity in general and I did have to sharply debride these wounds. With that being said the left lower extremity did not require sharp debridement today and appears to be doing well showing signs of excellent granulation. Electronic Signature(s) Signed: 05/22/2018 1:22:53 AM By: Lenda Kelp PA-C Entered By: Lenda Kelp on 05/21/2018 10:47:42 [name] ([id number]) -------------------------------------------------------------------------------- Physician Orders Details Patient Name: Jessica Casey R. Date of Service: 05/21/2018 10:00 AM Medical Record Number: [id number] Patient Account Number: [id number] Date of Birth/Sex: 1981-07-26 (37 y.o. F) Treating RN: Phillis Haggis Primary Care Provider: PATIENT,  NO Other Clinician: Referring Provider: Referral, Self Treating Provider/Extender: STONE III, HOYT Weeks in Treatment: 36 Verbal / Phone Orders: Yes Clinician: Pinkerton, Debi Read Back and Verified: Yes Diagnosis Coding Wound Cleansing Wound #2 Right,Proximal,Lateral Lower Leg o Clean wound with Normal Saline. o Cleanse wound with mild soap and water Wound #4 Right,Distal,Lateral Lower Leg o Clean wound with Normal Saline. o Cleanse wound with mild soap and water Wound #5 Left,Anterior Lower Leg o Clean wound with Normal Saline. o Cleanse wound with mild soap and water Anesthetic (add to Medication List) Wound #2 Right,Proximal,Lateral Lower Leg o Topical Lidocaine 4% cream applied to wound bed prior to debridement (In Clinic Only). Wound #4 Right,Distal,Lateral Lower Leg o Topical Lidocaine 4% cream applied to wound bed prior to debridement (In Clinic Only). Wound #5 Left,Anterior Lower Leg o Topical Lidocaine 4% cream applied to wound bed prior to debridement (In Clinic Only). Skin Barriers/Peri-Wound Care Wound #2 Right,Proximal,Lateral Lower Leg o Moisturizing lotion Wound #4 Right,Distal,Lateral Lower Leg o Moisturizing lotion Wound #5 Left,Anterior Lower Leg o Moisturizing lotion Primary Wound Dressing Wound #2 Right,Proximal,Lateral Lower Leg o Hydrafera Blue Ready Transfer Wound #4 Right,Distal,Lateral Lower Leg o Hydrafera Blue Ready Transfer Wound #5 Left,Anterior Lower Leg o Hydrafera Blue Ready Transfer Secondary Dressing Sweeden, Ziyanna R. ([id number]) Wound #2 Right,Proximal,Lateral Lower Leg o ABD pad Wound #4 Right,Distal,Lateral Lower Leg o ABD pad Wound #5 Left,Anterior Lower Leg o ABD pad Dressing Change Frequency Wound #2 Right,Proximal,Lateral Lower Leg o Change dressing every week Wound #4 Right,Distal,Lateral Lower Leg o Change dressing every week Wound #5 Left,Anterior Lower Leg o Change dressing every  week Follow-up Appointments Wound #2 Right,Proximal,Lateral Lower Leg o Return Appointment in 1 week. o Nurse Visit as needed o Other: - twice weekly clinic visit, return on friday  for nurse visit Wound #4 Right,Distal,Lateral Lower Leg o Return Appointment in 1 week. o Nurse Visit as needed o Other: - twice weekly clinic visit, return on friday for nurse visit Wound #5 Left,Anterior Lower Leg o Nurse Visit as needed o Other: - twice weekly clinic visit, return on friday for nurse visit Edema Control Wound #2 Right,Proximal,Lateral Lower Leg o 4 Layer Compression System - Bilateral - unna to anchor o Compression Pump: Use compression pump on left lower extremity for 30 minutes, twice daily. - one hour use o Compression Pump: Use compression pump on right lower extremity for 30 minutes, twice daily. - one hour use Wound #4 Right,Distal,Lateral Lower Leg o 4 Layer Compression System - Bilateral - unna to anchor o Compression Pump: Use compression pump on left lower extremity for 30 minutes, twice daily. - one hour use o Compression Pump: Use compression pump on right lower extremity for 30 minutes, twice daily. - one hour use Wound #5 Left,Anterior Lower Leg o 4 Layer Compression System - Bilateral - unna to anchor o Compression Pump: Use compression pump on left lower extremity for 30 minutes, twice daily. - one hour use o Compression Pump: Use compression pump on right lower extremity for 30 minutes, twice daily. - one hour use Additional Orders / Instructions o Vitamin A; Vitamin C, Zinc o Increase protein intake. Patient Medications [name], [name] ([id number]) Allergies: No Known Allergies Notifications Medication Indication Start End lidocaine DOSE 1 - topical 4 % cream - 1 cream topical Electronic Signature(s) Signed: 05/21/2018 4:10:39 PM By: [name] Signed: 05/22/2018 1:22:53 AM By: Lenda Kelp PA-C Entered By: [name] on 05/21/2018 10:37:56 [name], [name] ([id number]) -------------------------------------------------------------------------------- Prescription 05/21/2018 Patient Name: Jessica Casey R. Provider: Lenda Kelp PA-C Date of Birth: 12-15-1980 NPI#: [id number] Sex: F DEA#: [id number] Phone #: [phone] License #: Patient Address: [facility] [address] [facility] [place], [place] [address] [address] [place], [place] [address] [phone] Allergies No Known Allergies Medication Medication: Route: Strength: Form: lidocaine topical 4% cream Class: TOPICAL LOCAL ANESTHETICS Dose: Frequency / Time: Indication: 1 1 cream topical Number of Refills: Number of Units: 0 Generic Substitution: Start Date: End Date: Administered at Substitution Permitted Facility: Yes Time Administered: Time Discontinued: Note to Pharmacy: Signature(s): Date(s): Electronic Signature(s) Signed: 05/21/2018 4:10:39 PM By: [name] Signed: 05/22/2018 1:22:53 AM By: Lenda Kelp PA-C Entered By: [name] on 05/21/2018 10:37:57 [name], [name] ([id number]) [name], [name] ([id number]) --------------------------------------------------------------------------------  Problem List Details Patient Name: Jessica Casey R. Date of Service: 05/21/2018 10:00 AM Medical Record Number: [id number] Patient Account Number: [id number] Date of Birth/Sex: [birth year] (37 y.o. F) Treating RN: Phillis Haggis Primary Care Provider: PATIENT, NO Other Clinician: Referring Provider: Referral, Self Treating Provider/Extender: STONE III, HOYT Weeks in Treatment: 36 Active Problems ICD-10 Evaluated Encounter Code Description Active Date Today Diagnosis L97.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d 09/10/2017 No Yes L97.221 Non-pressure chronic ulcer of left calf limited to breakdown of 04/17/2018 No  Yes skin I89.0 Lymphedema, not elsewhere classified 09/10/2017 No Yes I87.311 Chronic venous hypertension (idiopathic) with ulcer of right 09/10/2017 No Yes lower extremity E66.01 Morbid (severe) obesity due to excess calories 09/10/2017 No Yes Inactive Problems Resolved Problems Electronic Signature(s) Signed: 05/22/2018 1:22:53 AM By: Lenda Kelp PA-C Entered By: Lenda Kelp on 05/21/2018 10:46:12 [name], [name] ([id number]) -------------------------------------------------------------------------------- Progress Note Details Patient Name: Jessica Casey R. Date of  Service: 05/21/2018 10:00 AM Medical Record Number: [id number] Patient Account Number: [id number] Date of Birth/Sex: Aug 29, 1981 (37 y.o. F) Treating RN: Phillis Haggis Primary Care Provider: PATIENT, NO Other Clinician: Referring Provider: Referral, Self Treating Provider/Extender: STONE III, HOYT Weeks in Treatment: 36 Subjective Chief Complaint Information obtained from Patient She is here in follow up evaluation of right lower extremity ulcers History of Present Illness (HPI) 37 year old patient well known to our [facility] wound care clinic where she has been seen since 2016 for bilateral lower extremity venous insufficiency disease with lymphedema and multiple ulcerations associated with morbid obesity. she had custom-made compression stockings and lymphedema pumps which were used in the past. most recently she was admitted to the hospital between October 11 and 09/02/2017 with sepsis, lower extremity wounds and lymphedema.she was initially treated in the outpatient with Keflex and Bactrim. she was initially treated in the hospital with vancomycin and Zosyn and changed over to Unasyn until her white count improved and her blood cultures were negative for 3 days. After her inpatient management she was discharged home on Augmentin to end on 09/13/2017 with a 14 day course. she has had outpatient vascular duplex  scans completed in November 2017 and her right ABI was 1.1 and the left ABI is 1.3. she had normal toe brachial indices bilaterally.she had three-vessel runoff in the right lower extremity and two-vessel runoff in the left lower extremity. On questioning the patient she does have custom made compression stockings and also has a lymphedema pump but has not been using it appropriately and has not been taking good care of herself. 09/17/2017 -- she returns today with compression stockings on the left side and the right side has had significant amount of drainage and has a very strong odor 09/24/2017 -- the drainage is increased significantly and she has more lymphedema and a very strong odor to her wound. Though she does not have systemic symptoms, or overt infection I believe she will benefit from some doxycycline given empirically. 10/01/2017 -- after starting the doxycycline and changing the dressing twice a week her symptoms and signs have definitely improved overall. 10/08/2017 -- she has completed her course of doxycycline but continues to have a lot of drainage and needs twice a week dressing changes. 11/08/17-she is here in follow-up evaluation for right lower extremity ulcers. She admits to using her lymphedema pumps twice daily, one hour per session. she is voicing no complaints or concerns, no signs of infection will change to [facility] 12/14/17 on evaluation today patient appears to be doing very well in regard to her wounds. She has been tolerating the dressing changes she continues to develop some portly the adherent granular tissue on the surface of the wound with some Slough. Obviously we are trying to get too much better wound bed. With that being said the hyper granulation the Hydrofera Blue Dressing to have helped with which is excellent news. However I think it may be time to try something a little bit different at this point. 01/11/18 on evaluation today patient appears to be doing  fairly well in regard to her right lateral lower extremity ulcers. This shows excellent signs of filling in which is great news. There does not appear to be any evidence of infection which is also good news. She does continue to work as well is good school. She is having no pain. 01/22/18 on evaluation today patient appears to be doing a little bit worse in my opinion in regard to the overall quality of that  [name], [name] ([id number]) granulation on her right lower extremity. She was not here last week due to being sick with a stomach virus this may have something to do with the fact that her wound appears to be a little bit worse. With that being said I'm also thinking that after switching from the [facility] Dressing to the silver collagen would really has not looked that's good in my opinion. We may want to swit 02/11/18 on evaluation today patient's right lower/lateral lower extremity ulcers appear to be doing very well at this point. Especially the more proximal ulcer has filled in much closer to surface which is good news. Nonetheless both show signs of improvement which is great news. There does not appear to be any evidence of infection which is also good news. In general patient has been doing well tolerating the wraps as well as the [employer]. 02/18/18 on evaluation today patient appears to be doing a little bit worse in regard to the periwound region the wounds themselves do not look much deteriorated to me. With that being said she has several small blisters/pustules noted in the periwound and there was a significant amount of drainage and maceration compared to previous. There has been a time that we had to bring her back for twice a week dressing changes as far as her wrap was concerned it has been a while since we've done that however. With that being said the patient has been having some burning and in general I'm concerned about the possibility of infection. She has  previously taken doxycycline with good result. Fortunately there does not appear to be any evidence of overall worsening in regard to the size of the wound and in fact the upper wound actually appears to be showing signs of good epithelialization. 03/18/18 on evaluation today patient appears to be doing excellent in regard to her right lateral lower extremity ulcer. She has been tolerating the dressing changes without complication. Fortunately this seems to be making great progress. Overall I see no signs of infection and there is dramatic improvement overall even compared to last week. 03/28/18 on evaluation today patient appears to be doing very well in regard to her right lateral lower surety ulcer. She has been tolerating the dressing changes without complication at this point. She states currently that she's having no significant discomfort which is excellent as well. Overall I'm pleased with how things seem to be progressing. 04/01/18 on evaluation today patient appears to be doing excellent in regard to her right lateral lower extremity ulcers. She has been tolerating the dressing changes without complication which is good news. With that being said the wraps still continues to show signs of helping with her fluid she does have Juxta-Lite compression stockings for when we are done with the current treatment regimen once everything heals. Mainly she just has the one area still remaining the smaller of the two wings is pretty much closed at this point there's just a very slight opening noted. 04/08/18 on evaluation today patient appears to be doing better in regard to the original wound on the right lateral lower extremity that we have been managing. Unfortunately she has a new area of weeping more anterior on the right lateral lower extremity and on the left lower extremity she has two new ulcers there appears to be some cellulitis noted at this time. I am concerned about the fact that this may in  fact be an infection that has caused the worsening and  swelling in the past this has been the case when we previously attempted to determine what was going on when she had down slides like this. With that being said the patient is seeming to tolerate the wraps fairly well for the most part. 04/17/18; since last time the patient was seen in this clinic she was hospitalized from 04/11/18 through 04/13/18; she presented with bilateral lower extremity pain worse on the right and a fever of up to 104. Noteworthy that when she was in the clinic last week she had new wounds on the left leg culture grew MRSA and she was prescribed Bactrim. She had 2 days of IV Vanco and Zosyn in the hospital. Her blood cultures were negative. She was discharged on Bactrim to cover the original MRSA on the right leg and Keflex to cover the possibility of strep. The hospitalist had a conversation with infectious disease. The patient arrives in clinic today for nurse check however given the recent hospitalization I was asked to look at her. The patient states she feels a lot better. No fever or chills. Still some pain in the right calf but a lot better. She arrived in the hospital with a white count of 15.6, the next day was 10.8. Comprehensive metabolic panel was normal. She is still taking Keflex and Bactrim It would appear that she had a surgical IandD at the bedside of the right calf felt to have a underlying abscess. According to our intake nurse the wound has expanded quite bit on the right lateral calf. She has no open area on the left anterior and left posterior calf as described last time 04/23/18 on evaluation today patient actually appears to be doing much better than when I last saw her. This obviously has been a couple weeks ago and in the interim she was admitted to the hospital for IV antibiotic therapy due to cellulitis, discharge, and fortunately the substance which hadn't sued has completely resolved. Her swelling  seems to be better in regard to her lower extremities as well and the wounds that opened up as results of the infection seems to be showing signs of improvement. There is some Slough noted on the left lateral wound otherwise a lot of weeping noted of the right lateral leg. [name], [name] ([id number]) 04/29/18 on evaluation today patient appears to be doing excellent in regard to her bilateral lower extremity ulcers. She's been tolerating the dressing changes without complication there does not appear to be any evidence of infection at this time. Overall I think she is made great improvement and seems to be showing signs of coming back around where she was prior to the cellulitis and sepsis episode. 05/06/18 on evaluation today patient appears to be doing better in regard to her bilateral ocean the ulcers. She's been tolerating the dressing changes without complication. Fortunately there does not appear to be any evidence of infection at this time. Her swelling is significantly down overall seems to be showing signs of good improvement as well. Fortunately I do believe that she is progressing nicely back to where she was prior to the cellulitis/sepsis issue. 05/13/18 on evaluation today patient seems to be showing signs of great improvement she's been tolerating the dressing changes without complication and overall there does not appear to be any evidence of infection. All of which is good news. The only issue she really have today is that some of the [facility] Dressing actually was stuck to the periwound of the right lateral lower extremity  however this was able to be removed during the debridement without complication. 05/21/18 on evaluation today patient appears to be doing very well in regard to her bilateral lower extremity ulcers. She has been tolerating the compression wraps without complication. There does not appear to be any evidence of infection at this point which is good news as well.  Overall I'm very pleased with the progress that has been made. She likewise is very happy. 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 Patient History Information obtained from Patient. Family History Kidney Disease - Mother, No family history of Cancer, Diabetes, Heart Disease, Hereditary Spherocytosis, Hypertension, Lung Disease, Seizures, Stroke, Thyroid Problems, Tuberculosis. Social History Never smoker, Marital Status - Married, Alcohol Use - Never, Drug Use - No History, Caffeine Use - Daily. Review of Systems (ROS) Constitutional Symptoms (General Health) Denies complaints or symptoms of Fever, Chills. Respiratory The patient has no complaints or symptoms. Cardiovascular Complains or has symptoms of LE edema. Psychiatric The patient has no complaints or symptoms. Objective Constitutional Obese and well-hydrated in no acute distress. Vitals Time Taken: 10:15 AM, Height: 74 in, Weight: 486 lbs, BMI: 62.4, Temperature: 98.4 F, Pulse: 103 bpm, Respiratory Rate: 20 breaths/min, Blood Pressure: 138/87 mmHg. Wilmore, Japji R. ([id number]) Respiratory normal breathing without difficulty. Cardiovascular 2+ pitting edema of the bilateral lower extremities. Psychiatric this patient is able to make decisions and demonstrates good insight into disease process. Alert and Oriented x 3. pleasant and cooperative. General Notes: Patient's wound bed shows evidence of slough on the right lower extremity in general and I did have to sharply debride these wounds. With that being said the left lower extremity did not require sharp debridement today and appears to be doing well showing signs of excellent granulation. Integumentary (Hair, Skin) Wound #2 status is Open. Original cause of wound was Gradually Appeared. The wound is located on the Right,Proximal,Lateral Lower Leg. The  wound measures 4cm length x 2.1cm width x 0.1cm depth; 6.597cm^2 area and 0.66cm^3 volume. Wound #4 status is Open. Original cause of wound was Gradually Appeared. The wound is located on the Right,Distal,Lateral Lower Leg. The wound measures 0.1cm length x 0.1cm width x 0.1cm depth; 0.008cm^2 area and 0.001cm^3 volume. Wound #5 status is Open. Original cause of wound was Gradually Appeared. The wound is located on the Left,Anterior Lower Leg. The wound measures 0.9cm length x 0.9cm width x 0.1cm depth; 0.636cm^2 area and 0.064cm^3 volume. 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 There was a Excisional Skin/Subcutaneous Tissue Debridement with a total area of 8.4 sq cm performed by STONE III, HOYT E., PA-C. With the following instrument(s): Curette to remove Viable and Non-Viable tissue/material. Material removed includes Subcutaneous Tissue, Slough, and Fibrin/Exudate after achieving pain control using Lidocaine 4% Topical Solution. No specimens were taken. A time out was conducted at 10:35, prior to the start of the procedure. A Minimum amount of bleeding was controlled with Pressure. The procedure was tolerated well with a pain level of 0 throughout and a pain level of 0 following the procedure. Patient s Level of Consciousness  post procedure was recorded as Awake and Alert. Post Debridement Measurements: 4cm length x 2.1cm width x 0.2cm depth; 1.319cm^3 volume. [name], HEINO RMarland Kitchen ([id number]) Character of Wound/Ulcer Post Debridement requires further debridement. Post procedure Diagnosis  Wound #2: Same as Pre-Procedure Wound #4 Pre-procedure diagnosis of Wound #4 is a Lymphedema located on the Right,Distal,Lateral Lower Leg . There was a Excisional Skin/Subcutaneous Tissue Debridement with a total area of 0.01 sq cm performed by STONE III, HOYT E., PA-C. With the following instrument(s): Curette to remove Viable and Non-Viable tissue/material. Material removed includes Subcutaneous Tissue, Slough, and Fibrin/Exudate after achieving pain control using Lidocaine 4% Topical Solution. No specimens were taken. A time out was conducted at 10:35, prior to the start of the procedure. A Minimum amount of bleeding was controlled with Pressure. The procedure was tolerated well with a pain level of 0 throughout and a pain level of 0 following the procedure. Patient s Level of Consciousness post procedure was recorded as Awake and Alert. Post Debridement Measurements: 0.3cm length x 0.3cm width x 0.2cm depth; 0.014cm^3 volume. Character of Wound/Ulcer Post Debridement requires further debridement. Post procedure Diagnosis Wound #4: Same as Pre-Procedure Plan Wound Cleansing: Wound #2 Right,Proximal,Lateral Lower Leg: Clean wound with Normal Saline. Cleanse wound with mild soap and water Wound #4 Right,Distal,Lateral Lower Leg: Clean wound with Normal Saline. Cleanse wound with mild soap and water Wound #5 Left,Anterior Lower Leg: Clean wound with Normal Saline. Cleanse wound with mild soap and water Anesthetic (add to Medication List): Wound #2 Right,Proximal,Lateral Lower Leg: Topical Lidocaine 4% cream applied to wound bed prior to debridement (In Clinic Only). Wound #4 Right,Distal,Lateral Lower Leg: Topical Lidocaine 4% cream applied to wound bed prior to debridement (In Clinic Only). Wound #5 Left,Anterior Lower Leg: Topical Lidocaine 4% cream applied to wound bed prior to debridement (In Clinic Only). Skin Barriers/Peri-Wound Care: Wound #2 Right,Proximal,Lateral Lower  Leg: Moisturizing lotion Wound #4 Right,Distal,Lateral Lower Leg: Moisturizing lotion Wound #5 Left,Anterior Lower Leg: Moisturizing lotion Primary Wound Dressing: Wound #2 Right,Proximal,Lateral Lower Leg: Hydrafera Blue Ready Transfer Wound #4 Right,Distal,Lateral Lower Leg: Hydrafera Blue Ready Transfer Wound #5 Left,Anterior Lower Leg: Hydrafera Blue Ready Transfer Secondary Dressing: Wound #2 Right,Proximal,Lateral Lower Leg: ABD pad Wound #4 Right,Distal,Lateral Lower Leg: ABD pad Lenahan, Dalasia R. ([id number]) Wound #5 Left,Anterior Lower Leg: ABD pad Dressing Change Frequency: Wound #2 Right,Proximal,Lateral Lower Leg: Change dressing every week Wound #4 Right,Distal,Lateral Lower Leg: Change dressing every week Wound #5 Left,Anterior Lower Leg: Change dressing every week Follow-up Appointments: Wound #2 Right,Proximal,Lateral Lower Leg: Return Appointment in 1 week. Nurse Visit as needed Other: - twice weekly clinic visit, return on friday for nurse visit Wound #4 Right,Distal,Lateral Lower Leg: Return Appointment in 1 week. Nurse Visit as needed Other: - twice weekly clinic visit, return on friday for nurse visit Wound #5 Left,Anterior Lower Leg: Nurse Visit as needed Other: - twice weekly clinic visit, return on friday for nurse visit Edema Control: Wound #2 Right,Proximal,Lateral Lower Leg: 4 Layer Compression System - Bilateral - unna to anchor Compression Pump: Use compression pump on left lower extremity for 30 minutes, twice daily. - one hour use Compression Pump: Use compression pump on right lower extremity for 30 minutes, twice daily. - one hour use Wound #4 Right,Distal,Lateral Lower Leg: 4 Layer Compression System - Bilateral - unna to anchor Compression Pump: Use compression pump on left lower extremity for 30 minutes, twice daily. - one hour use Compression Pump: Use compression pump on right lower extremity for 30 minutes,  twice daily. - one hour  use Wound #5 Left,Anterior Lower Leg: 4 Layer Compression System - Bilateral - unna to anchor Compression Pump: Use compression pump on left lower extremity for 30 minutes, twice daily. - one hour use Compression Pump: Use compression pump on right lower extremity for 30 minutes, twice daily. - one hour use Additional Orders / Instructions: Vitamin A; Vitamin C, Zinc Increase protein intake. The following medication(s) was prescribed: lidocaine topical 4 % cream 1 1 cream topical was prescribed at facility I am going to suggest at this point in time that we actually continue with the Current wound care measures since she seems to be showing signs of good improvement this is obviously excellent news. The patient is in agreement with the plan. We will see were things stand at follow-up in one weeks time. Please see above for specific wound care orders. We will see patient for re-evaluation in 1 week(s) here in the clinic. If anything worsens or changes patient will contact our office for additional recommendations. Electronic Signature(s) Signed: 05/22/2018 1:22:53 AM By: Lenda KelpStone III, Hoyt PA-C Entered By: Lenda KelpStone III, Hoyt on 05/21/2018 10:48:05 Jessica HindLEATH, Landon R. ([id number]) -------------------------------------------------------------------------------- ROS/PFSH Details Patient Name: Jessica CaseyLEATH, Laren R. Date of Service: 05/21/2018 10:00 AM Medical Record Number: [id number] Patient Account Number: [id number] Date of Birth/Sex: 1981/03/22 (37 y.o. F) Treating RN: Phillis HaggisPinkerton, Debi Primary Care Provider: PATIENT, NO Other Clinician: Referring Provider: Referral, Self Treating Provider/Extender: STONE III, HOYT Weeks in Treatment: 36 Information Obtained From Patient Wound History Do you currently have one or more open woundso Yes How many open wounds do you currently haveo 1 Approximately how long have you had your woundso 1 month How have you been treating your wound(s) until nowo aquacel ag Has your  wound(s) ever healed and then re-openedo No Have you had any lab work done in the past montho No Have you tested positive for an antibiotic resistant organism (MRSA, VRE)o No Have you tested positive for osteomyelitis (bone infection)o No Have you had any tests for circulation on your legso Yes Who ordered the testo [facility] Where was the test doneo [facility] Constitutional Symptoms (General Health) Complaints and Symptoms: Negative for: Fever; Chills Cardiovascular Complaints and Symptoms: Positive for: LE edema Medical History: Positive for: Hypertension Negative for: Angina; Arrhythmia; Congestive Heart Failure; Coronary Artery Disease; Deep Vein Thrombosis; Hypotension; Myocardial Infarction; Peripheral Arterial Disease; Peripheral Venous Disease; Phlebitis; Vasculitis Hematologic/Lymphatic Medical History: Positive for: Lymphedema Respiratory Complaints and Symptoms: No Complaints or Symptoms Medical History: Negative for: Aspiration; Asthma; Chronic Obstructive Pulmonary Disease (COPD); Pneumothorax; Sleep Apnea; Tuberculosis Psychiatric Complaints and Symptoms: No Complaints or Symptoms Filion, Vassie R. ([id number]) Immunizations Pneumococcal Vaccine: Received Pneumococcal Vaccination: No Immunization Notes: up to date Implantable Devices Family and Social History Cancer: No; Diabetes: No; Heart Disease: No; Hereditary Spherocytosis: No; Hypertension: No; Kidney Disease: Yes - Mother; Lung Disease: No; Seizures: No; Stroke: No; Thyroid Problems: No; Tuberculosis: No; Never smoker; Marital Status - Married; Alcohol Use: Never; Drug Use: No History; Caffeine Use: Daily; Financial Concerns: No; Food, Clothing or Shelter Needs: No; Support System Lacking: No; Transportation Concerns: No; Advanced Directives: No; Patient does not want information on Advanced Directives Physician Affirmation I have reviewed and agree with the above information. Electronic Signature(s) Signed:  05/21/2018 4:10:39 PM By: Alejandro MullingPinkerton, Debra Signed: 05/22/2018 1:22:53 AM By: Lenda KelpStone III, Hoyt PA-C Entered By: Lenda KelpStone III, Hoyt on 05/21/2018 10:46:56 Siegman, Cyrene R. ([id number]) -------------------------------------------------------------------------------- SuperBill Details Patient Name: Jessica CaseyLEATH, Mariacristina R. Date of Service: 05/21/2018 Medical Record  Number: [id number] Patient Account Number: [id number] Date of Birth/Sex: 06/22/1981 (37 y.o. F) Treating RN: Phillis Haggis Primary Care Provider: PATIENT, NO Other Clinician: Referring Provider: Referral, Self Treating Provider/Extender: STONE III, HOYT Weeks in Treatment: 36 Diagnosis Coding ICD-10 Codes Code Description L97.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d L97.221 Non-pressure chronic ulcer of left calf limited to breakdown of skin I89.0 Lymphedema, not elsewhere classified I87.311 Chronic venous hypertension (idiopathic) with ulcer of right lower extremity E66.01 Morbid (severe) obesity due to excess calories Facility Procedures CPT4 Code: 40981191 Description: 11042 - DEB SUBQ TISSUE 20 SQ CM/< ICD-10 Diagnosis Description I87.311 Chronic venous hypertension (idiopathic) with ulcer of right l Modifier: ower extremity Quantity: 1 Physician Procedures CPT4 Code: 4782956 Description: 11042 - WC PHYS SUBQ TISS 20 SQ CM ICD-10 Diagnosis Description I87.311 Chronic venous hypertension (idiopathic) with ulcer of right l Modifier: ower extremity Quantity: 1 Electronic Signature(s) Signed: 05/22/2018 1:22:53 AM By: Lenda Kelp PA-C Entered By: Lenda Kelp on 05/21/2018 10:48:19

## 2018-05-28 ENCOUNTER — Encounter: Payer: Self-pay | Admitting: Physician Assistant

## 2018-05-29 NOTE — Progress Notes (Signed)
MARGRETTA, ZAMORANO ([id number]) Visit Report for 05/28/2018 Chief Complaint Document Details Patient Name: [name], [name] Date of Service: 05/28/2018 8:45 AM Medical Record Number: [id number] Patient Account Number: [id number] Date of Birth/Sex: [birth year] (37 y.o. F) Treating RN: Phillis Haggis Primary Care Provider: PATIENT, NO Other Clinician: Referring Provider: Referral, Self Treating Provider/Extender: STONE III, HOYT Weeks in Treatment: 37 Information Obtained from: Patient Chief Complaint She is here in follow up evaluation of right lower extremity ulcers Electronic Signature(s) Signed: 05/28/2018 11:53:57 PM By: Lenda Kelp PA-C Entered By: Lenda Kelp on 05/28/2018 09:10:38 Gopal, Kawehi R. ([id number]) -------------------------------------------------------------------------------- Debridement Details Patient Name: Jessica Casey R. Date of Service: 05/28/2018 8:45 AM Medical Record Number: [id number] Patient Account Number: [id number] Date of Birth/Sex: 08-13-1981 (37 y.o. F) Treating RN: Phillis Haggis Primary Care Provider: PATIENT, NO Other Clinician: Referring Provider: Referral, Self Treating Provider/Extender: STONE III, HOYT Weeks in Treatment: 37 Debridement Performed for Wound #2 Right,Proximal,Lateral Lower Leg Assessment: Performed By: Physician STONE III, HOYT E., PA-C Debridement Type: Debridement Pre-procedure Verification/Time Yes - 09:32 Out Taken: Start Time: 09:32 Pain Control: Lidocaine 4% Topical Solution Total Area Debrided (L x W): 1.5 (cm) x 0.5 (cm) = 0.75 (cm) Tissue and other material Viable, Non-Viable, Slough, Subcutaneous, Fibrin/Exudate, Slough debrided: Level: Skin/Subcutaneous Tissue Debridement Description: Excisional Instrument: Curette Bleeding: Minimum Hemostasis Achieved: Pressure End Time: 09:36 Procedural Pain: 0 Post Procedural Pain: 0 Response to Treatment: Procedure was tolerated well Level of Consciousness: Awake and  Alert Post Debridement Measurements of Total Wound Length: (cm) 1.5 Width: (cm) 0.5 Depth: (cm) 0.2 Volume: (cm) 0.118 Character of Wound/Ulcer Post Debridement: Requires Further Debridement Post Procedure Diagnosis Same as Pre-procedure Electronic Signature(s) Signed: 05/28/2018 5:13:20 PM By: [name] Signed: 05/28/2018 11:53:57 PM By: Lenda Kelp PA-C Entered By: [name] on 05/28/2018 09:36:17 Roe, Shalee R. ([id number]) -------------------------------------------------------------------------------- HPI Details Patient Name: Jessica Casey R. Date of Service: 05/28/2018 8:45 AM Medical Record Number: [id number] Patient Account Number: [id number] Date of Birth/Sex: 14-May-1981 (37 y.o. F) Treating RN: Phillis Haggis Primary Care Provider: PATIENT, NO Other Clinician: Referring Provider: Referral, Self Treating Provider/Extender: STONE III, HOYT Weeks in Treatment: 37 History of Present Illness HPI Description: 37 year old patient well known to our [facility] wound care clinic where she has been seen since 2016 for bilateral lower extremity venous insufficiency disease with lymphedema and multiple ulcerations associated with morbid obesity. she had custom-made compression stockings and lymphedema pumps which were used in the past. most recently she was admitted to the hospital between October 11 and 09/02/2017 with sepsis, lower extremity wounds and lymphedema.she was initially treated in the outpatient with Keflex and Bactrim. she was initially treated in the hospital with vancomycin and Zosyn and changed over to Unasyn until her white count improved and her blood cultures were negative for 3 days. After her inpatient management she was discharged home on Augmentin to end on 09/13/2017 with a 14 day course. she has had outpatient vascular duplex scans completed in November 2017 and her right ABI was 1.1 and the left ABI is 1.3. she had normal toe brachial indices  bilaterally.she had three-vessel runoff in the right lower extremity and two-vessel runoff in the left lower extremity. On questioning the patient she does have custom made compression stockings and also has a lymphedema pump but has not been using it appropriately and has not been taking good care of herself. 09/17/2017 -- she returns today with compression stockings on the left side and the right side has had significant amount  of drainage and has a very strong odor 09/24/2017 -- the drainage is increased significantly and she has more lymphedema and a very strong odor to her wound. Though she does not have systemic symptoms, or overt infection I believe she will benefit from some doxycycline given empirically. 10/01/2017 -- after starting the doxycycline and changing the dressing twice a week her symptoms and signs have definitely improved overall. 10/08/2017 -- she has completed her course of doxycycline but continues to have a lot of drainage and needs twice a week dressing changes. 11/08/17-she is here in follow-up evaluation for right lower extremity ulcers. She admits to using her lymphedema pumps twice daily, one hour per session. she is voicing no complaints or concerns, no signs of infection will change to [facility] 12/14/17 on evaluation today patient appears to be doing very well in regard to her wounds. She has been tolerating the dressing changes she continues to develop some portly the adherent granular tissue on the surface of the wound with some Slough. Obviously we are trying to get too much better wound bed. With that being said the hyper granulation the Hydrofera Blue Dressing to have helped with which is excellent news. However I think it may be time to try something a little bit different at this point. 01/11/18 on evaluation today patient appears to be doing fairly well in regard to her right lateral lower extremity ulcers. This shows excellent signs of filling in which is  great news. There does not appear to be any evidence of infection which is also good news. She does continue to work as well is good school. She is having no pain. 01/22/18 on evaluation today patient appears to be doing a little bit worse in my opinion in regard to the overall quality of that granulation on her right lower extremity. She was not here last week due to being sick with a stomach virus this may have something to do with the fact that her wound appears to be a little bit worse. With that being said I'm also thinking that after switching from the [facility] Dressing to the silver collagen would really has not looked that's good in my opinion. We may want to swit 02/11/18 on evaluation today patient's right lower/lateral lower extremity ulcers appear to be doing very well at this point. Gorelik, Ryian R. ([id number]) Especially the more proximal ulcer has filled in much closer to surface which is good news. Nonetheless both show signs of improvement which is great news. There does not appear to be any evidence of infection which is also good news. In general patient has been doing well tolerating the wraps as well as the [employer]. 02/18/18 on evaluation today patient appears to be doing a little bit worse in regard to the periwound region the wounds themselves do not look much deteriorated to me. With that being said she has several small blisters/pustules noted in the periwound and there was a significant amount of drainage and maceration compared to previous. There has been a time that we had to bring her back for twice a week dressing changes as far as her wrap was concerned it has been a while since we've done that however. With that being said the patient has been having some burning and in general I'm concerned about the possibility of infection. She has previously taken doxycycline with good result. Fortunately there does not appear to be any evidence of overall  worsening in regard to the size of the  wound and in fact the upper wound actually appears to be showing signs of good epithelialization. 03/18/18 on evaluation today patient appears to be doing excellent in regard to her right lateral lower extremity ulcer. She has been tolerating the dressing changes without complication. Fortunately this seems to be making great progress. Overall I see no signs of infection and there is dramatic improvement overall even compared to last week. 03/28/18 on evaluation today patient appears to be doing very well in regard to her right lateral lower surety ulcer. She has been tolerating the dressing changes without complication at this point. She states currently that she's having no significant discomfort which is excellent as well. Overall I'm pleased with how things seem to be progressing. 04/01/18 on evaluation today patient appears to be doing excellent in regard to her right lateral lower extremity ulcers. She has been tolerating the dressing changes without complication which is good news. With that being said the wraps still continues to show signs of helping with her fluid she does have Juxta-Lite compression stockings for when we are done with the current treatment regimen once everything heals. Mainly she just has the one area still remaining the smaller of the two wings is pretty much closed at this point there's just a very slight opening noted. 04/08/18 on evaluation today patient appears to be doing better in regard to the original wound on the right lateral lower extremity that we have been managing. Unfortunately she has a new area of weeping more anterior on the right lateral lower extremity and on the left lower extremity she has two new ulcers there appears to be some cellulitis noted at this time. I am concerned about the fact that this may in fact be an infection that has caused the worsening and swelling in the past this has been the case when we  previously attempted to determine what was going on when she had down slides like this. With that being said the patient is seeming to tolerate the wraps fairly well for the most part. 04/17/18; since last time the patient was seen in this clinic she was hospitalized from 04/11/18 through 04/13/18; she presented with bilateral lower extremity pain worse on the right and a fever of up to 104. Noteworthy that when she was in the clinic last week she had new wounds on the left leg culture grew MRSA and she was prescribed Bactrim. She had 2 days of IV Vanco and Zosyn in the hospital. Her blood cultures were negative. She was discharged on Bactrim to cover the original MRSA on the right leg and Keflex to cover the possibility of strep. The hospitalist had a conversation with infectious disease. The patient arrives in clinic today for nurse check however given the recent hospitalization I was asked to look at her. The patient states she feels a lot better. No fever or chills. Still some pain in the right calf but a lot better. She arrived in the hospital with a white count of 15.6, the next day was 10.8. Comprehensive metabolic panel was normal. She is still taking Keflex and Bactrim It would appear that she had a surgical IandD at the bedside of the right calf felt to have a underlying abscess. According to our intake nurse the wound has expanded quite bit on the right lateral calf. She has no open area on the left anterior and left posterior calf as described last time 04/23/18 on evaluation today patient actually appears to be doing much better than  when I last saw her. This obviously has been a couple weeks ago and in the interim she was admitted to the hospital for IV antibiotic therapy due to cellulitis, discharge, and fortunately the substance which hadn't sued has completely resolved. Her swelling seems to be better in regard to her lower extremities as well and the wounds that opened up as results of  the infection seems to be showing signs of improvement. There is some Slough noted on the left lateral wound otherwise a lot of weeping noted of the right lateral leg. 04/29/18 on evaluation today patient appears to be doing excellent in regard to her bilateral lower extremity ulcers. She's been tolerating the dressing changes without complication there does not appear to be any evidence of infection at this time. Overall I think she is made great improvement and seems to be showing signs of coming back around where she was prior to the cellulitis and sepsis episode. JAMIESON, LISA ([id number]) 05/06/18 on evaluation today patient appears to be doing better in regard to her bilateral ocean the ulcers. She's been tolerating the dressing changes without complication. Fortunately there does not appear to be any evidence of infection at this time. Her swelling is significantly down overall seems to be showing signs of good improvement as well. Fortunately I do believe that she is progressing nicely back to where she was prior to the cellulitis/sepsis issue. 05/13/18 on evaluation today patient seems to be showing signs of great improvement she's been tolerating the dressing changes without complication and overall there does not appear to be any evidence of infection. All of which is good news. The only issue she really have today is that some of the [facility] Dressing actually was stuck to the periwound of the right lateral lower extremity however this was able to be removed during the debridement without complication. 05/21/18 on evaluation today patient appears to be doing very well in regard to her bilateral lower extremity ulcers. She has been tolerating the compression wraps without complication. There does not appear to be any evidence of infection at this point which is good news as well. Overall I'm very pleased with the progress that has been made. She likewise is very happy. 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 05/28/18 on evaluation today patient actually appears to be doing excellent in regard to her bilateral lower Trinity ulcers. She's made great progress since last week and overall I'm very pleased in this regard. She states that she's not have any discomfort either which is also good news there's definitely no evidence of infection. Electronic Signature(s) Signed: 05/28/2018 11:53:57 PM By: Lenda Kelp PA-C Entered By: Lenda Kelp on 05/28/2018 09:38:34 Noguez, Cristal Deer ([id number]) -------------------------------------------------------------------------------- Physical Exam Details Patient Name: Jessica Casey R. Date of Service: 05/28/2018 8:45 AM Medical Record Number: [id number] Patient Account Number: [id number] Date of Birth/Sex: 07-14-1981 (37 y.o. F) Treating RN: Phillis Haggis Primary Care Provider: PATIENT, NO Other Clinician: Referring Provider: Referral, Self Treating Provider/Extender: STONE III, HOYT Weeks in Treatment: 37 Constitutional Obese and well-hydrated in no acute distress. Respiratory normal breathing without difficulty. Psychiatric this patient is able to make decisions and demonstrates good insight into disease process. Alert and Oriented x 3. pleasant and cooperative. Notes Patient's wound bed at this point seems to be showing some signs of excellent improvement the left lower extremity has progressed very nicely the  right lower extremity has also improved quite significantly. There just several small little openings at this point with a fairly good granulation bed minimal slough noted on the surface of the wound. Sharp debridement was performed and she tolerated this well without complication post debridement the wound beds of the right lower Trinity actually appear to be doing much better. Electronic  Signature(s) Signed: 05/28/2018 11:53:57 PM By: Lenda Kelp PA-C Entered By: Lenda Kelp on 05/28/2018 09:40:00 [name] ([id number]) -------------------------------------------------------------------------------- Physician Orders Details Patient Name: Jessica Casey R. Date of Service: 05/28/2018 8:45 AM Medical Record Number: [id number] Patient Account Number: [id number] Date of Birth/Sex: [birth year] (37 y.o. F) Treating RN: Phillis Haggis Primary Care Provider: PATIENT, NO Other Clinician: Referring Provider: Referral, Self Treating Provider/Extender: STONE III, HOYT Weeks in Treatment: 37 Verbal / Phone Orders: Yes Clinician: Ashok Cordia, Debi Read Back and Verified: Yes Diagnosis Coding ICD-10 Coding Code Description [id number] 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d L97.221 Non-pressure chronic ulcer of left calf limited to breakdown of skin I89.0 Lymphedema, not elsewhere classified I87.311 Chronic venous hypertension (idiopathic) with ulcer of right lower extremity E66.01 Morbid (severe) obesity due to excess calories Wound Cleansing Wound #2 Right,Proximal,Lateral Lower Leg o Clean wound with Normal Saline. o Cleanse wound with mild soap and water Wound #5 Left,Anterior Lower Leg o Clean wound with Normal Saline. o Cleanse wound with mild soap and water Anesthetic (add to Medication List) Wound #2 Right,Proximal,Lateral Lower Leg o Topical Lidocaine 4% cream applied to wound bed prior to debridement (In Clinic Only). Wound #5 Left,Anterior Lower Leg o Topical Lidocaine 4% cream applied to wound bed prior to debridement (In Clinic Only). Skin Barriers/Peri-Wound Care Wound #2 Right,Proximal,Lateral Lower Leg o Moisturizing lotion Wound #5 Left,Anterior Lower Leg o Moisturizing lotion Primary Wound Dressing Wound #2 Right,Proximal,Lateral Lower Leg o Hydrafera Blue Ready Transfer Wound #5 Left,Anterior Lower Leg o  Hydrafera Blue Ready Transfer Secondary Dressing Wound #2 Right,Proximal,Lateral Lower Leg o ABD pad Newsome, Satoya R. ([id number]) Wound #5 Left,Anterior Lower Leg o ABD pad Dressing Change Frequency Wound #2 Right,Proximal,Lateral Lower Leg o Change dressing every week Wound #5 Left,Anterior Lower Leg o Change dressing every week Follow-up Appointments Wound #2 Right,Proximal,Lateral Lower Leg o Return Appointment in 1 week. o Nurse Visit as needed o Other: - twice weekly clinic visit, return on friday for nurse visit Wound #5 Left,Anterior Lower Leg o Nurse Visit as needed o Other: - twice weekly clinic visit, return on friday for nurse visit Edema Control Wound #2 Right,Proximal,Lateral Lower Leg o 4 Layer Compression System - Bilateral - unna to anchor o Compression Pump: Use compression pump on left lower extremity for 30 minutes, twice daily. - one hour use o Compression Pump: Use compression pump on right lower extremity for 30 minutes, twice daily. - one hour use o Other: - order juxtalites for bilateral legs Wound #5 Left,Anterior Lower Leg o 4 Layer Compression System - Bilateral - unna to anchor o Compression Pump: Use compression pump on left lower extremity for 30 minutes, twice daily. - one hour use o Compression Pump: Use compression pump on right lower extremity for 30 minutes, twice daily. - one hour use o Other: - order juxtalites for bilateral legs Additional Orders / Instructions o Vitamin A; Vitamin C, Zinc o Increase protein intake. Patient Medications Allergies: No Known Allergies Notifications Medication Indication Start End lidocaine DOSE 1 - topical 4 % cream - 1 cream topical Electronic Signature(s) Signed: 05/28/2018 5:13:20 PM  By: [name] Signed: 05/28/2018 11:53:57 PM By: Lenda Kelp PA-C Entered By: [name] on 05/28/2018 09:37:23 Dittmer, Cristal Deer  ([id number]) -------------------------------------------------------------------------------- Prescription 05/28/2018 Patient Name: Jessica Casey R. Provider: Lenda Kelp PA-C Date of Birth: 1981-06-10 NPI#: [id number] Sex: F DEA#: [id number] Phone #: [phone] License #: Patient Address: [facility] [address] [facility] [place], [place] [address] [address], [address] [place], [place] [address] [phone] Allergies No Known Allergies Medication Medication: Route: Strength: Form: lidocaine topical 4% cream Class: TOPICAL LOCAL ANESTHETICS Dose: Frequency / Time: Indication: 1 1 cream topical Number of Refills: Number of Units: 0 Generic Substitution: Start Date: End Date: Administered at Substitution Permitted Facility: Yes Time Administered: Time Discontinued: Note to Pharmacy: Signature(s): Date(s): Electronic Signature(s) Signed: 05/28/2018 5:13:20 PM By: [name] Signed: 05/28/2018 11:53:57 PM By: Lenda Kelp PA-C Entered By: [name] on 05/28/2018 09:37:25 Hardgrave, Shawnae R. ([id number]) Karilyn Cota, Yazleen R. ([id number]) --------------------------------------------------------------------------------  Problem List Details Patient Name: Jessica Casey R. Date of Service: 05/28/2018 8:45 AM Medical Record Number: [id number] Patient Account Number: [id number] Date of Birth/Sex: Feb 12, 1981 (37 y.o. F) Treating RN: Phillis Haggis Primary Care Provider: PATIENT, NO Other Clinician: Referring Provider: Referral, Self Treating Provider/Extender: STONE III, HOYT Weeks in Treatment: 37 Active Problems ICD-10 Evaluated Encounter Code Description Active Date Today Diagnosis L97.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d 09/10/2017 No Yes L97.221 Non-pressure chronic ulcer of left calf limited to breakdown of 04/17/2018 No Yes skin I89.0 Lymphedema, not elsewhere  classified 09/10/2017 No Yes I87.311 Chronic venous hypertension (idiopathic) with ulcer of right 09/10/2017 No Yes lower extremity E66.01 Morbid (severe) obesity due to excess calories 09/10/2017 No Yes Inactive Problems Resolved Problems Electronic Signature(s) Signed: 05/28/2018 11:53:57 PM By: Lenda Kelp PA-C Entered By: Lenda Kelp on 05/28/2018 09:10:32 Fout, Shania R. ([id number]) -------------------------------------------------------------------------------- Progress Note Details Patient Name: Jessica Casey R. Date of Service: 05/28/2018 8:45 AM Medical Record Number: [id number] Patient Account Number: [id number] Date of Birth/Sex: 1981/09/22 (37 y.o. F) Treating RN: Phillis Haggis Primary Care Provider: PATIENT, NO Other Clinician: Referring Provider: Referral, Self Treating Provider/Extender: STONE III, HOYT Weeks in Treatment: 37 Subjective Chief Complaint Information obtained from Patient She is here in follow up evaluation of right lower extremity ulcers History of Present Illness (HPI) 37 year old patient well known to our [facility] wound care clinic where she has been seen since 2016 for bilateral lower extremity venous insufficiency disease with lymphedema and multiple ulcerations associated with morbid obesity. she had custom-made compression stockings and lymphedema pumps which were used in the past. most recently she was admitted to the hospital between October 11 and 09/02/2017 with sepsis, lower extremity wounds and lymphedema.she was initially treated in the outpatient with Keflex and Bactrim. she was initially treated in the hospital with vancomycin and Zosyn and changed over to Unasyn until her white count improved and her blood cultures were negative for 3 days. After her inpatient management she was discharged home on Augmentin to end on 09/13/2017 with a 14 day course. she has had outpatient vascular duplex scans completed in November 2017 and her  right ABI was 1.1 and the left ABI is 1.3. she had normal toe brachial indices bilaterally.she had three-vessel runoff in the right lower extremity and two-vessel runoff in the left lower extremity. On questioning the patient she does have custom made compression stockings and also has a lymphedema pump but has not been using it appropriately and has  not been taking good care of herself. 09/17/2017 -- she returns today with compression stockings on the left side and the right side has had significant amount of drainage and has a very strong odor 09/24/2017 -- the drainage is increased significantly and she has more lymphedema and a very strong odor to her wound. Though she does not have systemic symptoms, or overt infection I believe she will benefit from some doxycycline given empirically. 10/01/2017 -- after starting the doxycycline and changing the dressing twice a week her symptoms and signs have definitely improved overall. 10/08/2017 -- she has completed her course of doxycycline but continues to have a lot of drainage and needs twice a week dressing changes. 11/08/17-she is here in follow-up evaluation for right lower extremity ulcers. She admits to using her lymphedema pumps twice daily, one hour per session. she is voicing no complaints or concerns, no signs of infection will change to [facility] 12/14/17 on evaluation today patient appears to be doing very well in regard to her wounds. She has been tolerating the dressing changes she continues to develop some portly the adherent granular tissue on the surface of the wound with some Slough. Obviously we are trying to get too much better wound bed. With that being said the hyper granulation the Hydrofera Blue Dressing to have helped with which is excellent news. However I think it may be time to try something a little bit different at this point. 01/11/18 on evaluation today patient appears to be doing fairly well in regard to her right  lateral lower extremity ulcers. This shows excellent signs of filling in which is great news. There does not appear to be any evidence of infection which is also good news. She does continue to work as well is good school. She is having no pain. 01/22/18 on evaluation today patient appears to be doing a little bit worse in my opinion in regard to the overall quality of that Boutwell, Kaly R. ([id number]) granulation on her right lower extremity. She was not here last week due to being sick with a stomach virus this may have something to do with the fact that her wound appears to be a little bit worse. With that being said I'm also thinking that after switching from the [facility] Dressing to the silver collagen would really has not looked that's good in my opinion. We may want to swit 02/11/18 on evaluation today patient's right lower/lateral lower extremity ulcers appear to be doing very well at this point. Especially the more proximal ulcer has filled in much closer to surface which is good news. Nonetheless both show signs of improvement which is great news. There does not appear to be any evidence of infection which is also good news. In general patient has been doing well tolerating the wraps as well as the [employer]. 02/18/18 on evaluation today patient appears to be doing a little bit worse in regard to the periwound region the wounds themselves do not look much deteriorated to me. With that being said she has several small blisters/pustules noted in the periwound and there was a significant amount of drainage and maceration compared to previous. There has been a time that we had to bring her back for twice a week dressing changes as far as her wrap was concerned it has been a while since we've done that however. With that being said the patient has been having some burning and in general I'm concerned about the possibility of infection. She  has previously taken doxycycline with  good result. Fortunately there does not appear to be any evidence of overall worsening in regard to the size of the wound and in fact the upper wound actually appears to be showing signs of good epithelialization. 03/18/18 on evaluation today patient appears to be doing excellent in regard to her right lateral lower extremity ulcer. She has been tolerating the dressing changes without complication. Fortunately this seems to be making great progress. Overall I see no signs of infection and there is dramatic improvement overall even compared to last week. 03/28/18 on evaluation today patient appears to be doing very well in regard to her right lateral lower surety ulcer. She has been tolerating the dressing changes without complication at this point. She states currently that she's having no significant discomfort which is excellent as well. Overall I'm pleased with how things seem to be progressing. 04/01/18 on evaluation today patient appears to be doing excellent in regard to her right lateral lower extremity ulcers. She has been tolerating the dressing changes without complication which is good news. With that being said the wraps still continues to show signs of helping with her fluid she does have Juxta-Lite compression stockings for when we are done with the current treatment regimen once everything heals. Mainly she just has the one area still remaining the smaller of the two wings is pretty much closed at this point there's just a very slight opening noted. 04/08/18 on evaluation today patient appears to be doing better in regard to the original wound on the right lateral lower extremity that we have been managing. Unfortunately she has a new area of weeping more anterior on the right lateral lower extremity and on the left lower extremity she has two new ulcers there appears to be some cellulitis noted at this time. I am concerned about the fact that this may in fact be an infection that has  caused the worsening and swelling in the past this has been the case when we previously attempted to determine what was going on when she had down slides like this. With that being said the patient is seeming to tolerate the wraps fairly well for the most part. 04/17/18; since last time the patient was seen in this clinic she was hospitalized from 04/11/18 through 04/13/18; she presented with bilateral lower extremity pain worse on the right and a fever of up to 104. Noteworthy that when she was in the clinic last week she had new wounds on the left leg culture grew MRSA and she was prescribed Bactrim. She had 2 days of IV Vanco and Zosyn in the hospital. Her blood cultures were negative. She was discharged on Bactrim to cover the original MRSA on the right leg and Keflex to cover the possibility of strep. The hospitalist had a conversation with infectious disease. The patient arrives in clinic today for nurse check however given the recent hospitalization I was asked to look at her. The patient states she feels a lot better. No fever or chills. Still some pain in the right calf but a lot better. She arrived in the hospital with a white count of 15.6, the next day was 10.8. Comprehensive metabolic panel was normal. She is still taking Keflex and Bactrim It would appear that she had a surgical IandD at the bedside of the right calf felt to have a underlying abscess. According to our intake nurse the wound has expanded quite bit on the right lateral calf. She has no  open area on the left anterior and left posterior calf as described last time 04/23/18 on evaluation today patient actually appears to be doing much better than when I last saw her. This obviously has been a couple weeks ago and in the interim she was admitted to the hospital for IV antibiotic therapy due to cellulitis, discharge, and fortunately the substance which hadn't sued has completely resolved. Her swelling seems to be better in regard  to her lower extremities as well and the wounds that opened up as results of the infection seems to be showing signs of improvement. There is some Slough noted on the left lateral wound otherwise a lot of weeping noted of the right lateral leg. JAHNAVI, MURATORE ([id number]) 04/29/18 on evaluation today patient appears to be doing excellent in regard to her bilateral lower extremity ulcers. She's been tolerating the dressing changes without complication there does not appear to be any evidence of infection at this time. Overall I think she is made great improvement and seems to be showing signs of coming back around where she was prior to the cellulitis and sepsis episode. 05/06/18 on evaluation today patient appears to be doing better in regard to her bilateral ocean the ulcers. She's been tolerating the dressing changes without complication. Fortunately there does not appear to be any evidence of infection at this time. Her swelling is significantly down overall seems to be showing signs of good improvement as well. Fortunately I do believe that she is progressing nicely back to where she was prior to the cellulitis/sepsis issue. 05/13/18 on evaluation today patient seems to be showing signs of great improvement she's been tolerating the dressing changes without complication and overall there does not appear to be any evidence of infection. All of which is good news. The only issue she really have today is that some of the [facility] Dressing actually was stuck to the periwound of the right lateral lower extremity however this was able to be removed during the debridement without complication. 05/21/18 on evaluation today patient appears to be doing very well in regard to her bilateral lower extremity ulcers. She has been tolerating the compression wraps without complication. There does not appear to be any evidence of infection at this point which is good news as well. Overall I'm very pleased with  the progress that has been made. She likewise is very happy. 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 05/28/18 on evaluation today patient actually appears to be doing excellent in regard to her bilateral lower Trinity ulcers. She's made great progress since last week and overall I'm very pleased in this regard. She states that she's not have any discomfort either which is also good news there's definitely no evidence of infection. Patient History Information obtained from Patient. Family History Kidney Disease - Mother, No family history of Cancer, Diabetes, Heart Disease, Hereditary Spherocytosis, Hypertension, Lung Disease, Seizures, Stroke, Thyroid Problems, Tuberculosis. Social History Never smoker, Marital Status - Married, Alcohol Use - Never, Drug Use - No History, Caffeine Use - Daily. Review of Systems (ROS) Constitutional Symptoms (General Health) Denies complaints or symptoms of Fever, Chills. Respiratory The patient has no complaints or symptoms. Cardiovascular Complains or has symptoms of LE edema. Psychiatric The patient has no complaints or symptoms. Objective Constitutional Perot, Hiral R. ([id number]) Obese and well-hydrated in no acute distress. Vitals Time Taken: 9:01 AM, Height: 74 in,  Weight: 486 lbs, BMI: 62.4, Temperature: 98.3 F, Pulse: 103 bpm, Respiratory Rate: 20 breaths/min, Blood Pressure: 150/92 mmHg. Respiratory normal breathing without difficulty. Psychiatric this patient is able to make decisions and demonstrates good insight into disease process. Alert and Oriented x 3. pleasant and cooperative. General Notes: Patient's wound bed at this point seems to be showing some signs of excellent improvement the left lower extremity has progressed very nicely the right lower extremity has also improved quite significantly. There just several  small little openings at this point with a fairly good granulation bed minimal slough noted on the surface of the wound. Sharp debridement was performed and she tolerated this well without complication post debridement the wound beds of the right lower Trinity actually appear to be doing much better. Integumentary (Hair, Skin) Wound #2 status is Open. Original cause of wound was Gradually Appeared. The wound is located on the Right,Proximal,Lateral Lower Leg. The wound measures 1.5cm length x 0.5cm width x 0.1cm depth; 0.589cm^2 area and 0.059cm^3 volume. There is Fat Layer (Subcutaneous Tissue) Exposed exposed. There is no tunneling or undermining noted. There is a medium amount of serous drainage noted. The wound margin is flat and intact. There is large (67-100%) pink granulation within the wound bed. There is a small (1-33%) amount of necrotic tissue within the wound bed including Eschar and Adherent Slough. The periwound skin appearance did not exhibit: Callus, Crepitus, Excoriation, Induration, Rash, Scarring, Dry/Scaly, Maceration, Atrophie Blanche, Cyanosis, Ecchymosis, Hemosiderin Staining, Mottled, Pallor, Rubor, Erythema. Periwound temperature was noted as No Abnormality. Wound #4 status is Open. Original cause of wound was Gradually Appeared. The wound is located on the Right,Distal,Lateral Lower Leg. The wound measures 0cm length x 0cm width x 0cm depth; 0cm^2 area and 0cm^3 volume. Wound #5 status is Open. Original cause of wound was Gradually Appeared. The wound is located on the Left,Anterior Lower Leg. The wound measures 0.7cm length x 0.5cm width x 0.1cm depth; 0.275cm^2 area and 0.027cm^3 volume. There is Fat Layer (Subcutaneous Tissue) Exposed exposed. There is no tunneling or undermining noted. There is a medium amount of serous drainage noted. The wound margin is flat and intact. There is large (67-100%) pink granulation within the wound bed. There is a small (1-33%) amount  of necrotic tissue within the wound bed including Adherent Slough. The periwound skin appearance did not exhibit: Callus, Crepitus, Excoriation, Induration, Rash, Scarring, Dry/Scaly, Maceration, Atrophie Blanche, Cyanosis, Ecchymosis, Hemosiderin Staining, Mottled, Pallor, Rubor, Erythema. Periwound temperature was noted as No Abnormality. 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 ([id number]) Procedures Wound #2 Pre-procedure diagnosis of Wound #2 is a Lymphedema located on the Right,Proximal,Lateral Lower Leg . There was a Excisional Skin/Subcutaneous Tissue Debridement with a total area of 0.75 sq cm performed by STONE III, HOYT E., PA-C. With the following instrument(s): Curette to remove Viable and Non-Viable tissue/material. Material removed includes Subcutaneous Tissue, Slough, and Fibrin/Exudate after achieving pain control using Lidocaine 4% Topical Solution. No specimens were taken. A time out was conducted at 09:32, prior to the start of the procedure. A Minimum amount of bleeding was controlled with Pressure. The procedure was tolerated well with a pain level of 0 throughout and a pain level of 0 following the procedure. Patient s Level of Consciousness  post procedure was recorded as Awake and Alert. Post Debridement Measurements: 1.5cm length x 0.5cm width x 0.2cm depth; 0.118cm^3 volume. Character of Wound/Ulcer Post Debridement requires further debridement. Post procedure Diagnosis Wound #2: Same as Pre-Procedure Plan Wound Cleansing: Wound #2 Right,Proximal,Lateral Lower Leg: Clean wound with Normal Saline. Cleanse wound with mild soap and water Wound #5 Left,Anterior Lower  Leg: Clean wound with Normal Saline. Cleanse wound with mild soap and water Anesthetic (add to Medication List): Wound #2 Right,Proximal,Lateral Lower Leg: Topical Lidocaine 4% cream applied to wound bed prior to debridement (In Clinic Only). Wound #5 Left,Anterior Lower Leg: Topical Lidocaine 4% cream applied to wound bed prior to debridement (In Clinic Only). Skin Barriers/Peri-Wound Care: Wound #2 Right,Proximal,Lateral Lower Leg: Moisturizing lotion Wound #5 Left,Anterior Lower Leg: Moisturizing lotion Primary Wound Dressing: Wound #2 Right,Proximal,Lateral Lower Leg: Hydrafera Blue Ready Transfer Wound #5 Left,Anterior Lower Leg: Hydrafera Blue Ready Transfer Secondary Dressing: Wound #2 Right,Proximal,Lateral Lower Leg: ABD pad Wound #5 Left,Anterior Lower Leg: ABD pad Dressing Change Frequency: Wound #2 Right,Proximal,Lateral Lower Leg: Change dressing every week Wound #5 Left,Anterior Lower Leg: Change dressing every week Follow-up Appointments: Wound #2 Right,Proximal,Lateral Lower Leg: Florio, Lanina R. ([id number]) Return Appointment in 1 week. Nurse Visit as needed Other: - twice weekly clinic visit, return on friday for nurse visit Wound #5 Left,Anterior Lower Leg: Nurse Visit as needed Other: - twice weekly clinic visit, return on friday for nurse visit Edema Control: Wound #2 Right,Proximal,Lateral Lower Leg: 4 Layer Compression System - Bilateral - unna to anchor Compression Pump: Use compression pump on left lower extremity for 30 minutes, twice daily. - one hour use Compression Pump: Use compression pump on right lower extremity for 30 minutes, twice daily. - one hour use Other: - order juxtalites for bilateral legs Wound #5 Left,Anterior Lower Leg: 4 Layer Compression System - Bilateral - unna to anchor Compression Pump: Use compression pump on left lower extremity for 30 minutes, twice daily. - one hour use Compression Pump: Use compression pump on right  lower extremity for 30 minutes, twice daily. - one hour use Other: - order juxtalites for bilateral legs Additional Orders / Instructions: Vitamin A; Vitamin C, Zinc Increase protein intake. The following medication(s) was prescribed: lidocaine topical 4 % cream 1 1 cream topical was prescribed at facility I'm gonna suggest at this point in time that we actually continue with the Current wound care measures for the next week. We are going to order a new pair of Juxta-Lite compression wraps for her since she has done well with these in the past. Hers were also several years old. We will see her for reevaluation in one weeks time. Please see above for specific wound care orders. We will see patient for re-evaluation in 1 week(s) here in the clinic. If anything worsens or changes patient will contact our office for additional recommendations. Electronic Signature(s) Signed: 05/28/2018 11:53:57 PM By: Lenda KelpStone III, Hoyt PA-C Entered By: Lenda KelpStone III, Hoyt on 05/28/2018 09:40:27 Lea, Brigitte Marland Kitchen. ([id number]) -------------------------------------------------------------------------------- ROS/PFSH Details Patient Name: Jessica CaseyLEATH, Evora R. Date of Service: 05/28/2018 8:45 AM Medical Record Number: [id number] Patient Account Number: [id number] Date of Birth/Sex: 16-Sep-1981 (37 y.o. F) Treating RN: Phillis HaggisPinkerton, Debi Primary Care Provider: PATIENT, NO Other Clinician: Referring Provider: Referral, Self Treating Provider/Extender: STONE III, HOYT Weeks in Treatment: 37 Information Obtained From Patient Wound History Do you currently have one or more open woundso Yes How many open wounds do you currently haveo 1 Approximately how long have you had your woundso  1 month How have you been treating your wound(s) until nowo aquacel ag Has your wound(s) ever healed and then re-openedo No Have you had any lab work done in the past montho No Have you tested positive for an antibiotic resistant organism (MRSA, VRE)o  No Have you tested positive for osteomyelitis (bone infection)o No Have you had any tests for circulation on your legso Yes Who ordered the testo [facility] Where was the test doneo [facility] Constitutional Symptoms (General Health) Complaints and Symptoms: Negative for: Fever; Chills Cardiovascular Complaints and Symptoms: Positive for: LE edema Medical History: Positive for: Hypertension Negative for: Angina; Arrhythmia; Congestive Heart Failure; Coronary Artery Disease; Deep Vein Thrombosis; Hypotension; Myocardial Infarction; Peripheral Arterial Disease; Peripheral Venous Disease; Phlebitis; Vasculitis Hematologic/Lymphatic Medical History: Positive for: Lymphedema Respiratory Complaints and Symptoms: No Complaints or Symptoms Medical History: Negative for: Aspiration; Asthma; Chronic Obstructive Pulmonary Disease (COPD); Pneumothorax; Sleep Apnea; Tuberculosis Psychiatric Complaints and Symptoms: No Complaints or Symptoms Mcgurn, Nea R. ([id number]) Immunizations Pneumococcal Vaccine: Received Pneumococcal Vaccination: No Immunization Notes: up to date Implantable Devices Family and Social History Cancer: No; Diabetes: No; Heart Disease: No; Hereditary Spherocytosis: No; Hypertension: No; Kidney Disease: Yes - Mother; Lung Disease: No; Seizures: No; Stroke: No; Thyroid Problems: No; Tuberculosis: No; Never smoker; Marital Status - Married; Alcohol Use: Never; Drug Use: No History; Caffeine Use: Daily; Financial Concerns: No; Food, Clothing or Shelter Needs: No; Support System Lacking: No; Transportation Concerns: No; Advanced Directives: No; Patient does not want information on Advanced Directives Physician Affirmation I have reviewed and agree with the above information. Electronic Signature(s) Signed: 05/28/2018 5:13:20 PM By: [name] Signed: 05/28/2018 11:53:57 PM By: Lenda Kelp PA-C Entered By: Lenda Kelp on 05/28/2018 09:39:38 Cozart, Samyiah R.  ([id number]) -------------------------------------------------------------------------------- SuperBill Details Patient Name: Jessica Casey R. Date of Service: 05/28/2018 Medical Record Number: [id number] Patient Account Number: [id number] Date of Birth/Sex: 1981-01-12 (37 y.o. F) Treating RN: Phillis Haggis Primary Care Provider: PATIENT, NO Other Clinician: Referring Provider: Referral, Self Treating Provider/Extender: STONE III, HOYT Weeks in Treatment: 37 Diagnosis Coding ICD-10 Codes Code Description 9562432251 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d L97.221 Non-pressure chronic ulcer of left calf limited to breakdown of skin I89.0 Lymphedema, not elsewhere classified I87.311 Chronic venous hypertension (idiopathic) with ulcer of right lower extremity E66.01 Morbid (severe) obesity due to excess calories Facility Procedures CPT4 Code: 46962952 Description: 11042 - DEB SUBQ TISSUE 20 SQ CM/< ICD-10 Diagnosis Description L97.212 Non-pressure chronic ulcer of right calf with fat layer expo Modifier: sed Quantity: 1 Physician Procedures CPT4 Code: 8413244 Description: 11042 - WC PHYS SUBQ TISS 20 SQ CM ICD-10 Diagnosis Description L97.212 Non-pressure chronic ulcer of right calf with fat layer expo Modifier: sed Quantity: 1 Electronic Signature(s) Signed: 05/28/2018 11:53:57 PM By: Lenda Kelp PA-C Entered By: Lenda Kelp on 05/28/2018 09:40:52

## 2018-05-29 NOTE — Progress Notes (Signed)
[name], Sravya R. ([id number]) Visit Report for 05/28/2018 Arrival Information Details Patient Name: [name], [name] Date of Service: 05/28/2018 8:45 AM Medical Record Number: [id number] Patient Account Number: [id number] Date of Birth/Sex: [birth year] (37 y.o. F) Treating RN: Renne CriglerFlinchum, Cheryl Primary Care Annalucia Laino: PATIENT, NO Other Clinician: Referring Almee Pelphrey: Referral, Self Treating Lennell Shanks/Extender: STONE III, HOYT Weeks in Treatment: 37 Visit Information History Since Last Visit All ordered tests and consults were completed: No Patient Arrived: Ambulatory Added or deleted any medications: No Arrival Time: 09:00 Any new allergies or adverse reactions: No Accompanied By: self Had a fall or experienced change in No Transfer Assistance: None activities of daily living that may affect Patient Identification Verified: Yes risk of falls: Secondary Verification Process Yes Signs or symptoms of abuse/neglect since last visito No Completed: Hospitalized since last visit: No Patient Requires Transmission-Based No Implantable device outside of the clinic excluding No Precautions: cellular tissue based products placed in the center Patient Has Alerts: Yes since last visit: Patient Alerts: ABI 05/05/18 L 1.03 R Pain Present Now: No 1.03 TBI 05/05/18 L .88 R .73 Electronic Signature(s) Signed: 05/28/2018 11:46:23 AM By: Renne CriglerFlinchum, Cheryl Entered By: Renne CriglerFlinchum, Cheryl on 05/28/2018 09:00:53 [name], [name] ([id number]) -------------------------------------------------------------------------------- Encounter Discharge Information Details Patient Name: [name], [name] Date of Service: 05/28/2018 8:45 AM Medical Record Number: [id number] Patient Account Number: [id number] Date of Birth/Sex: [birth year] (37 y.o. F) Treating RN: Renne CriglerFlinchum, Cheryl Primary Care Miaya Lafontant: PATIENT, NO Other Clinician: Referring Tramane Gorum: Referral, Self Treating James Lafalce/Extender: STONE III, HOYT Weeks in Treatment:  37 Encounter Discharge Information Items Discharge Condition: Stable Ambulatory Status: Ambulatory Discharge Destination: Home Transportation: Private Auto Schedule Follow-up Appointment: Yes Clinical Summary of Care: Electronic Signature(s) Signed: 05/28/2018 11:46:23 AM By: Renne CriglerFlinchum, Cheryl Entered By: Renne CriglerFlinchum, Cheryl on 05/28/2018 10:14:23 [name], [name] ([id number]) -------------------------------------------------------------------------------- Lower Extremity Assessment Details Patient Name: [name], [name] Date of Service: 05/28/2018 8:45 AM Medical Record Number: [id number] Patient Account Number: [id number] Date of Birth/Sex: [birth year] (37 y.o. F) Treating RN: Renne CriglerFlinchum, Cheryl Primary Care Otila Starn: PATIENT, NO Other Clinician: Referring Lorilei Horan: Referral, Self Treating Tiphani Mells/Extender: STONE III, HOYT Weeks in Treatment: 37 Edema Assessment Assessed: [Left: No] [Right: No] Edema: [Left: Yes] [Right: Yes] Calf Left: Right: Point of Measurement: 36 cm From Medial Instep 57.1 cm 58 cm Ankle Left: Right: Point of Measurement: 9 cm From Medial Instep 36 cm 35 cm Vascular Assessment Pulses: Dorsalis Pedis Palpable: [Left:Yes] [Right:Yes] Posterior Tibial Extremity colors, hair growth, and conditions: Extremity Color: [Left:Hyperpigmented] [Right:Hyperpigmented] Hair Growth on Extremity: [Left:Yes] [Right:Yes] Temperature of Extremity: [Left:Warm] [Right:Warm] Capillary Refill: [Left:< 3 seconds] [Right:< 3 seconds] Toe Nail Assessment Left: Right: Thick: Yes Yes Discolored: Yes Yes Deformed: Yes Yes Improper Length and Hygiene: Yes Yes Notes Heel to Knee- 53 cm Electronic Signature(s) Signed: 05/28/2018 11:46:23 AM By: Renne CriglerFlinchum, Cheryl Signed: 05/28/2018 5:13:20 PM By: Alejandro MullingPinkerton, Debra Entered By: Alejandro MullingPinkerton, Debra on 05/28/2018 09:31:43 [name], [name] ([id number]) -------------------------------------------------------------------------------- Multi Wound Chart  Details Patient Name: [name], [name] Date of Service: 05/28/2018 8:45 AM Medical Record Number: [id number] Patient Account Number: [id number] Date of Birth/Sex: [birth year] (37 y.o. F) Treating RN: Phillis HaggisPinkerton, Debi Primary Care Rehan Holness: PATIENT, NO Other Clinician: Referring Catrell Morrone: Referral, Self Treating Leiana Rund/Extender: STONE III, HOYT Weeks in Treatment: 37 Vital Signs Height(in): 74 Pulse(bpm): 103 Weight(lbs): 486 Blood Pressure(mmHg): 150/92 Body Mass Index(BMI): 62 Temperature(F): 98.3 Respiratory Rate 20 (breaths/min): Photos: Wound Location: Right Lower Leg - Lateral, Right, Distal, Lateral Lower Left Lower Leg - Anterior Proximal Leg Wounding Event: Gradually Appeared Gradually Appeared Gradually Appeared  Primary Etiology: Lymphedema Lymphedema Lymphedema Comorbid History: Lymphedema, Hypertension N/A Lymphedema, Hypertension Date Acquired: 08/08/2017 04/07/2018 04/07/2018 Weeks of Treatment: 37 7 7 Wound Status: Open Open Open Clustered Wound: Yes No No Clustered Quantity: 3 N/A N/A Measurements L x W x D 1.5x0.5x0.1 0x0x0 0.7x0.5x0.1 (cm) Area (cm) : 0.589 0 0.275 Volume (cm) : 0.059 0 0.027 % Reduction in Area: 97.30% 100.00% 96.40% % Reduction in Volume: 99.10% 100.00% 99.30% Classification: Full Thickness With Exposed Partial Thickness Full Thickness Without Support Structures Exposed Support Structures Exudate Amount: Medium N/A Medium Exudate Type: Serous N/A Serous Exudate Color: amber N/A amber Wound Margin: Flat and Intact N/A Flat and Intact Granulation Amount: Large (67-100%) N/A Large (67-100%) Granulation Quality: Pink N/A Pink Necrotic Amount: Small (1-33%) N/A Small (1-33%) Necrotic Tissue: Eschar, Adherent Slough N/A Adherent Slough Exposed Structures: Fat Layer (Subcutaneous N/A Fat Layer (Subcutaneous Tissue) Exposed: Yes Tissue) Exposed: Yes Fascia: No Fascia: No Blaschke, [name] ([id number]) Tendon: No Tendon: No Muscle:  No Muscle: No Joint: No Joint: No Bone: No Bone: No Epithelialization: None N/A None Periwound Skin Texture: Excoriation: No No Abnormalities Noted Excoriation: No Induration: No Induration: No Callus: No Callus: No Crepitus: No Crepitus: No Rash: No Rash: No Scarring: No Scarring: No Periwound Skin Moisture: Maceration: No No Abnormalities Noted Maceration: No Dry/Scaly: No Dry/Scaly: No Periwound Skin Color: Atrophie Blanche: No No Abnormalities Noted Atrophie Blanche: No Cyanosis: No Cyanosis: No Ecchymosis: No Ecchymosis: No Erythema: No Erythema: No Hemosiderin Staining: No Hemosiderin Staining: No Mottled: No Mottled: No Pallor: No Pallor: No Rubor: No Rubor: No Temperature: No Abnormality N/A No Abnormality Tenderness on Palpation: No No No Wound Preparation: Ulcer Cleansing: Other: soap N/A Ulcer Cleansing: and water Rinsed/Irrigated with Saline, Other: soap and water Topical Anesthetic Applied: Other: lidocaine 4% Topical Anesthetic Applied: Other: lidocaine 4% Treatment Notes Electronic Signature(s) Signed: 05/28/2018 5:13:20 PM By: [name] Entered By: [name] on 05/28/2018 09:27:47 Jessica Hind ([id number]) -------------------------------------------------------------------------------- Multi-Disciplinary Care Plan Details Patient Name: Jessica Casey R. Date of Service: 05/28/2018 8:45 AM Medical Record Number: [id number] Patient Account Number: [id number] Date of Birth/Sex: [birth year] (37 y.o. F) Treating RN: Phillis Haggis Primary Care Montell Leopard: PATIENT, NO Other Clinician: Referring Bea Duren: Referral, Self Treating Derico Mitton/Extender: STONE III, HOYT Weeks in Treatment: 37 Active Inactive ` Orientation to the Wound Care Program Nursing Diagnoses: Knowledge deficit related to the wound healing center program Goals: Patient/caregiver will verbalize understanding of the Wound Healing Center Program Date Initiated:  09/10/2017 Target Resolution Date: 11/23/2017 Goal Status: Active Interventions: Provide education on orientation to the wound center Notes: ` Venous Leg Ulcer Nursing Diagnoses: Knowledge deficit related to disease process and management Goals: Patient/caregiver will verbalize understanding of disease process and disease management Date Initiated: 09/10/2017 Target Resolution Date: 11/23/2017 Goal Status: Active Interventions: Assess peripheral edema status every visit. Notes: ` Wound/Skin Impairment Nursing Diagnoses: Impaired tissue integrity Goals: Ulcer/skin breakdown will heal within 14 weeks Date Initiated: 09/10/2017 Target Resolution Date: 11/24/2017 Goal Status: Active Interventions: ZAKYAH, [name] ([id number]) Assess patient/caregiver ability to obtain necessary supplies Assess patient/caregiver ability to perform ulcer/skin care regimen upon admission and as needed Assess ulceration(s) every visit Notes: Electronic Signature(s) Signed: 05/28/2018 5:13:20 PM By: [name] Entered By: [name] on 05/28/2018 09:27:38 [name], [name] ([id number]) -------------------------------------------------------------------------------- Pain Assessment Details Patient Name: Jessica Casey R. Date of Service: 05/28/2018 8:45 AM Medical Record Number: [id number] Patient Account Number: [id number] Date of Birth/Sex: Mar 03, 1981 (37 y.o. F) Treating RN: Renne Crigler Primary Care Holliday Sheaffer: PATIENT, NO Other  Clinician: Referring Runell Kovich: Referral, Self Treating Anneka Studer/Extender: STONE III, HOYT Weeks in Treatment: 37 Active Problems Location of Pain Severity and Description of Pain Patient Has Paino No Site Locations Pain Management and Medication Current Pain Management: Electronic Signature(s) Signed: 05/28/2018 11:46:23 AM By: Renne Crigler Entered By: Renne Crigler on 05/28/2018 09:01:01 Jessica Hind  ([id number]) -------------------------------------------------------------------------------- Patient/Caregiver Education Details Patient Name: Jessica Casey R. Date of Service: 05/28/2018 8:45 AM Medical Record Number: [id number] Patient Account Number: [id number] Date of Birth/Gender: [birth year] (37 y.o. F) Treating RN: Renne Crigler Primary Care Physician: PATIENT, NO Other Clinician: Referring Physician: Referral, Self Treating Physician/Extender: Linwood Dibbles, HOYT Weeks in Treatment: 37 Education Assessment Education Provided To: Patient Education Topics Provided Wound/Skin Impairment: Handouts: Caring for Your Ulcer Methods: Explain/Verbal Responses: State content correctly Electronic Signature(s) Signed: 05/28/2018 11:46:23 AM By: Renne Crigler Entered By: Renne Crigler on 05/28/2018 10:14:39 [name], [name] ([id number]) -------------------------------------------------------------------------------- Wound Assessment Details Patient Name: Jessica Casey R. Date of Service: 05/28/2018 8:45 AM Medical Record Number: [id number] Patient Account Number: [id number] Date of Birth/Sex: [birth year] (37 y.o. F) Treating RN: Renne Crigler Primary Care Cully Luckow: PATIENT, NO Other Clinician: Referring Juan Kissoon: Referral, Self Treating Burdett Pinzon/Extender: STONE III, HOYT Weeks in Treatment: 37 Wound Status Wound Number: 2 Primary Etiology: Lymphedema Wound Location: Right Lower Leg - Lateral, Proximal Wound Status: Open Wounding Event: Gradually Appeared Comorbid History: Lymphedema, Hypertension Date Acquired: 08/08/2017 Weeks Of Treatment: 37 Clustered Wound: Yes Photos Photo Uploaded By: Curtis Sites on 05/28/2018 09:18:20 Wound Measurements Length: (cm) 1.5 Width: (cm) 0.5 Depth: (cm) 0.1 Clustered Quantity: 3 Area: (cm) 0.589 Volume: (cm) 0.059 % Reduction in Area: 97.3% % Reduction in Volume: 99.1% Epithelialization: None Tunneling: No Undermining: No Wound  Description Full Thickness With Exposed Support Classification: Structures Wound Margin: Flat and Intact Exudate Medium Amount: Exudate Type: Serous Exudate Color: amber Foul Odor After Cleansing: No Slough/Fibrino Yes Wound Bed Granulation Amount: Large (67-100%) Exposed Structure Granulation Quality: Pink Fascia Exposed: No Necrotic Amount: Small (1-33%) Fat Layer (Subcutaneous Tissue) Exposed: Yes Necrotic Quality: Eschar, Adherent Slough Tendon Exposed: No Muscle Exposed: No Joint Exposed: No [name], [name] ([id number]) Bone Exposed: No Periwound Skin Texture Texture Color No Abnormalities Noted: No No Abnormalities Noted: No Callus: No Atrophie Blanche: No Crepitus: No Cyanosis: No Excoriation: No Ecchymosis: No Induration: No Erythema: No Rash: No Hemosiderin Staining: No Scarring: No Mottled: No Pallor: No Moisture Rubor: No No Abnormalities Noted: No Dry / Scaly: No Temperature / Pain Maceration: No Temperature: No Abnormality Wound Preparation Ulcer Cleansing: Other: soap and water, Topical Anesthetic Applied: Other: lidocaine 4%, Treatment Notes Wound #2 (Right, Proximal, Lateral Lower Leg) 1. Cleansed with: Clean wound with Normal Saline 2. Anesthetic Topical Lidocaine 4% cream to wound bed prior to debridement 3. Peri-wound Care: Moisturizing lotion 4. Dressing Applied: Hydrafera Blue 5. Secondary Dressing Applied ABD Pad 7. Secured with 4 Layer Compression System - Bilateral Electronic Signature(s) Signed: 05/28/2018 11:46:23 AM By: Renne Crigler Entered By: Renne Crigler on 05/28/2018 09:13:43 Fryberger, [name] R. ([id number]) -------------------------------------------------------------------------------- Wound Assessment Details Patient Name: Jessica Casey R. Date of Service: 05/28/2018 8:45 AM Medical Record Number: [id number] Patient Account Number: [id number] Date of Birth/Sex: 1981/02/01 (37 y.o. F) Treating RN: Renne Crigler Primary Care Joline Encalada: PATIENT, NO Other Clinician: Referring Whitman Meinhardt: Referral, Self Treating Gamaliel Charney/Extender: STONE III, HOYT Weeks in Treatment: 37 Wound Status Wound Number: 4 Primary Etiology: Lymphedema Wound Location: Right, Distal, Lateral Lower Leg Wound Status: Open Wounding Event: Gradually Appeared Date Acquired: 04/07/2018 Weeks Of Treatment: 7 Clustered Wound:  No Photos Photo Uploaded By: Curtis Sites on 05/28/2018 09:18:21 Wound Measurements Length: (cm) 0 % Width: (cm) 0 % Depth: (cm) 0 Area: (cm) 0 Volume: (cm) 0 Reduction in Area: 100% Reduction in Volume: 100% Wound Description Classification: Partial Thickness Periwound Skin Texture Texture Color No Abnormalities Noted: No No Abnormalities Noted: No Moisture No Abnormalities Noted: No Electronic Signature(s) Signed: 05/28/2018 11:46:23 AM By: Renne Crigler Entered By: Renne Crigler on 05/28/2018 09:12:31 Perot, Chalsey R. ([id number]) -------------------------------------------------------------------------------- Wound Assessment Details Patient Name: Jessica Casey R. Date of Service: 05/28/2018 8:45 AM Medical Record Number: [id number] Patient Account Number: [id number] Date of Birth/Sex: 15-Aug-1981 (37 y.o. F) Treating RN: Renne Crigler Primary Care Zayvien Canning: PATIENT, NO Other Clinician: Referring Juanice Warburton: Referral, Self Treating Jla Reynolds/Extender: STONE III, HOYT Weeks in Treatment: 37 Wound Status Wound Number: 5 Primary Etiology: Lymphedema Wound Location: Left Lower Leg - Anterior Wound Status: Open Wounding Event: Gradually Appeared Comorbid History: Lymphedema, Hypertension Date Acquired: 04/07/2018 Weeks Of Treatment: 7 Clustered Wound: No Photos Photo Uploaded By: Curtis Sites on 05/28/2018 09:18:36 Wound Measurements Length: (cm) 0.7 Width: (cm) 0.5 Depth: (cm) 0.1 Area: (cm) 0.275 Volume: (cm) 0.027 % Reduction in Area: 96.4% % Reduction in Volume:  99.3% Epithelialization: None Tunneling: No Undermining: No Wound Description Full Thickness Without Exposed Support Classification: Structures Wound Margin: Flat and Intact Exudate Medium Amount: Exudate Type: Serous Exudate Color: amber Foul Odor After Cleansing: No Slough/Fibrino Yes Wound Bed Granulation Amount: Large (67-100%) Exposed Structure Granulation Quality: Pink Fascia Exposed: No Necrotic Amount: Small (1-33%) Fat Layer (Subcutaneous Tissue) Exposed: Yes Necrotic Quality: Adherent Slough Tendon Exposed: No Muscle Exposed: No Joint Exposed: No Bone Exposed: No Liford, Katerine R. ([id number]) Periwound Skin Texture Texture Color No Abnormalities Noted: No No Abnormalities Noted: No Callus: No Atrophie Blanche: No Crepitus: No Cyanosis: No Excoriation: No Ecchymosis: No Induration: No Erythema: No Rash: No Hemosiderin Staining: No Scarring: No Mottled: No Pallor: No Moisture Rubor: No No Abnormalities Noted: No Dry / Scaly: No Temperature / Pain Maceration: No Temperature: No Abnormality Wound Preparation Ulcer Cleansing: Rinsed/Irrigated with Saline, Other: soap and water, Topical Anesthetic Applied: Other: lidocaine 4%, Treatment Notes Wound #5 (Left, Anterior Lower Leg) 1. Cleansed with: Clean wound with Normal Saline 2. Anesthetic Topical Lidocaine 4% cream to wound bed prior to debridement 3. Peri-wound Care: Moisturizing lotion 4. Dressing Applied: Hydrafera Blue 5. Secondary Dressing Applied ABD Pad 7. Secured with 4 Layer Compression System - Bilateral Electronic Signature(s) Signed: 05/28/2018 11:46:23 AM By: Renne Crigler Entered By: Renne Crigler on 05/28/2018 09:14:15 Peterkin, Dawt R. ([id number]) -------------------------------------------------------------------------------- Vitals Details Patient Name: Jessica Casey R. Date of Service: 05/28/2018 8:45 AM Medical Record Number: [id number] Patient Account Number:  [id number] Date of Birth/Sex: February 09, 1981 (37 y.o. F) Treating RN: Renne Crigler Primary Care Corlis Angelica: PATIENT, NO Other Clinician: Referring Farheen Pfahler: Referral, Self Treating Kaylah Chiasson/Extender: STONE III, HOYT Weeks in Treatment: 37 Vital Signs Time Taken: 09:01 Temperature (F): 98.3 Height (in): 74 Pulse (bpm): 103 Weight (lbs): 486 Respiratory Rate (breaths/min): 20 Body Mass Index (BMI): 62.4 Blood Pressure (mmHg): 150/92 Reference Range: 80 - 120 mg / dl Electronic Signature(s) Signed: 05/28/2018 11:46:23 AM By: Renne Crigler Entered By: Renne Crigler on 05/28/2018 09:01:19

## 2018-06-03 ENCOUNTER — Encounter: Payer: Self-pay | Admitting: Physician Assistant

## 2018-06-04 ENCOUNTER — Ambulatory Visit: Payer: Self-pay | Admitting: Physician Assistant

## 2018-06-04 NOTE — Progress Notes (Signed)
SARAHGRACE, [name] ([id number]) Visit Report for 06/03/2018 Arrival Information Details Patient Name: [name], [name] Date of Service: 06/03/2018 9:15 AM Medical Record Number: [id number] Patient Account Number: [id number] Date of Birth/Sex: Oct 01, 1981 (37 y.o. F) Treating RN: Curtis Sites Primary Care Rey Fors: PATIENT, NO Other Clinician: Referring Tressia Labrum: Referral, Self Treating Onell Mcmath/Extender: STONE III, HOYT Weeks in Treatment: 38 Visit Information History Since Last Visit Added or deleted any medications: No Patient Arrived: Ambulatory Any new allergies or adverse reactions: No Arrival Time: 08:50 Had a fall or experienced change in No Accompanied By: self activities of daily living that may affect Transfer Assistance: None risk of falls: Patient Identification Verified: Yes Signs or symptoms of abuse/neglect since last visito No Secondary Verification Process Yes Hospitalized since last visit: No Completed: Implantable device outside of the clinic excluding No Patient Requires Transmission-Based No cellular tissue based products placed in the center Precautions: since last visit: Patient Has Alerts: Yes Has Dressing in Place as Prescribed: Yes Patient Alerts: ABI 05/05/18 L 1.03 R Has Compression in Place as Prescribed: Yes 1.03 Pain Present Now: No TBI 05/05/18 L .88 R .73 Electronic Signature(s) Signed: 06/03/2018 5:53:18 PM By: Curtis Sites Entered By: Curtis Sites on 06/03/2018 08:50:19 Matera, Kriti R. ([id number]) -------------------------------------------------------------------------------- Clinic Level of Care Assessment Details Patient Name: Jessica Casey R. Date of Service: 06/03/2018 9:15 AM Medical Record Number: [id number] Patient Account Number: [id number] Date of Birth/Sex: 1981-05-10 (37 y.o. F) Treating RN: Renne Crigler Primary Care Leanette Eutsler: PATIENT, NO Other Clinician: Referring Ahsha Hinsley: Referral, Self Treating Yafet Cline/Extender: STONE  III, HOYT Weeks in Treatment: 38 Clinic Level of Care Assessment Items TOOL 4 Quantity Score X - Use when only an EandM is performed on FOLLOW-UP visit 1 0 ASSESSMENTS - Nursing Assessment / Reassessment X - Reassessment of Co-morbidities (includes updates in patient status) 1 10 X- 1 5 Reassessment of Adherence to Treatment Plan ASSESSMENTS - Wound and Skin Assessment / Reassessment []  - Simple Wound Assessment / Reassessment - one wound 0 X- 2 5 Complex Wound Assessment / Reassessment - multiple wounds []  - 0 Dermatologic / Skin Assessment (not related to wound area) ASSESSMENTS - Focused Assessment []  - Circumferential Edema Measurements - multi extremities 0 []  - 0 Nutritional Assessment / Counseling / Intervention []  - 0 Lower Extremity Assessment (monofilament, tuning fork, pulses) []  - 0 Peripheral Arterial Disease Assessment (using hand held doppler) ASSESSMENTS - Ostomy and/or Continence Assessment and Care []  - Incontinence Assessment and Management 0 []  - 0 Ostomy Care Assessment and Management (repouching, etc.) PROCESS - Coordination of Care X - Simple Patient / Family Education for ongoing care 1 15 []  - 0 Complex (extensive) Patient / Family Education for ongoing care []  - 0 Staff obtains Chiropractor, Records, Test Results / Process Orders []  - 0 Staff telephones HHA, Nursing Homes / Clarify orders / etc []  - 0 Routine Transfer to another Facility (non-emergent condition) []  - 0 Routine Hospital Admission (non-emergent condition) []  - 0 New Admissions / Manufacturing engineer / Ordering NPWT, Apligraf, etc. []  - 0 Emergency Hospital Admission (emergent condition) X- 1 10 Simple Discharge Coordination Edgley, Andersyn R. ([id number]) []  - 0 Complex (extensive) Discharge Coordination PROCESS - Special Needs []  - Pediatric / Minor Patient Management 0 []  - 0 Isolation Patient Management []  - 0 Hearing / Language / Visual special needs []  - 0 Assessment of  Community assistance (transportation, D/C planning, etc.) []  - 0 Additional assistance / Altered mentation []  - 0 Support Surface(s) Assessment (bed, cushion, seat, etc.)  INTERVENTIONS - Wound Cleansing / Measurement []  - Simple Wound Cleansing - one wound 0 X- 2 5 Complex Wound Cleansing - multiple wounds X- 1 5 Wound Imaging (photographs - any number of wounds) []  - 0 Wound Tracing (instead of photographs) []  - 0 Simple Wound Measurement - one wound X- 2 5 Complex Wound Measurement - multiple wounds INTERVENTIONS - Wound Dressings X - Small Wound Dressing one or multiple wounds 2 10 []  - 0 Medium Wound Dressing one or multiple wounds []  - 0 Large Wound Dressing one or multiple wounds []  - 0 Application of Medications - topical []  - 0 Application of Medications - injection INTERVENTIONS - Miscellaneous []  - External ear exam 0 []  - 0 Specimen Collection (cultures, biopsies, blood, body fluids, etc.) []  - 0 Specimen(s) / Culture(s) sent or taken to Lab for analysis []  - 0 Patient Transfer (multiple staff / Nurse, adult / Similar devices) []  - 0 Simple Staple / Suture removal (25 or less) []  - 0 Complex Staple / Suture removal (26 or more) []  - 0 Hypo / Hyperglycemic Management (close monitor of Blood Glucose) []  - 0 Ankle / Brachial Index (ABI) - do not check if billed separately X- 1 5 Vital Signs Sailer, Chantale R. ([id number]) Has the patient been seen at the hospital within the last three years: Yes Total Score: 100 Level Of Care: New/Established - Level 3 Electronic Signature(s) Signed: 06/03/2018 5:44:59 PM By: Renne Crigler Entered By: Renne Crigler on 06/03/2018 09:39:18 Woolstenhulme, Shakari R. ([id number]) -------------------------------------------------------------------------------- Encounter Discharge Information Details Patient Name: Jessica Casey R. Date of Service: 06/03/2018 9:15 AM Medical Record Number: [id number] Patient Account Number: [id number] Date  of Birth/Sex: 11-02-1981 (37 y.o. F) Treating RN: Phillis Haggis Primary Care Gennell How: PATIENT, NO Other Clinician: Referring Shahidah Nesbitt: Referral, Self Treating Nasha Diss/Extender: STONE III, HOYT Weeks in Treatment: 76 Encounter Discharge Information Items Discharge Condition: Stable Ambulatory Status: Ambulatory Discharge Destination: Home Transportation: Private Auto Accompanied By: friend Schedule Follow-up Appointment: Yes Clinical Summary of Care: Electronic Signature(s) Signed: 06/03/2018 10:31:09 AM By: [name] Entered By: [name] on 06/03/2018 10:31:09 [name] ([id number]) -------------------------------------------------------------------------------- Lower Extremity Assessment Details Patient Name: Jessica Casey R. Date of Service: 06/03/2018 9:15 AM Medical Record Number: [id number] Patient Account Number: [id number] Date of Birth/Sex: 12-08-1980 (37 y.o. F) Treating RN: Curtis Sites Primary Care Raihana Balderrama: PATIENT, NO Other Clinician: Referring Keegan Bensch: Referral, Self Treating Zymir Napoli/Extender: STONE III, HOYT Weeks in Treatment: 38 Edema Assessment Assessed: [Left: No] [Right: No] [Left: Edema] [Right: :] Calf Left: Right: Point of Measurement: 36 cm From Medial Instep 56.7 cm 57.7 cm Ankle Left: Right: Point of Measurement: 9 cm From Medial Instep 35.6 cm 34.8 cm Vascular Assessment Pulses: Dorsalis Pedis Palpable: [Left:Yes] [Right:Yes] Posterior Tibial Extremity colors, hair growth, and conditions: Extremity Color: [Left:Hyperpigmented] [Right:Hyperpigmented] Hair Growth on Extremity: [Left:Yes] [Right:Yes] Temperature of Extremity: [Left:Warm] [Right:Warm] Capillary Refill: [Left:< 3 seconds] [Right:< 3 seconds] Toe Nail Assessment Left: Right: Thick: Yes Yes Discolored: Yes Yes Deformed: No No Improper Length and Hygiene: Yes Yes Electronic Signature(s) Signed: 06/03/2018 5:53:18 PM By: Curtis Sites Entered By: Curtis Sites on 06/03/2018 08:58:39 Bowker, Bailynn R. ([id number]) -------------------------------------------------------------------------------- Multi Wound Chart Details Patient Name: Jessica Casey R. Date of Service: 06/03/2018 9:15 AM Medical Record Number: [id number] Patient Account Number: [id number] Date of Birth/Sex: 13-Sep-1981 (37 y.o. F) Treating RN: Renne Crigler Primary Care Seanne Chirico: PATIENT, NO Other Clinician: Referring Ashtin Melichar: Referral, Self Treating Geisha Abernathy/Extender: STONE III, HOYT Weeks in Treatment: 38 Vital Signs Height(in): 74 Pulse(bpm): 94  Weight(lbs): 486 Blood Pressure(mmHg): 135/96 Body Mass Index(BMI): 62 Temperature(F): 98.2 Respiratory Rate 22 (breaths/min): Photos: [2:No Photos] [5:No Photos] [N/A:N/A] Wound Location: [2:Right Lower Leg - Lateral, Proximal] [5:Left Lower Leg - Anterior] [N/A:N/A] Wounding Event: [2:Gradually Appeared] [5:Gradually Appeared] [N/A:N/A] Primary Etiology: [2:Lymphedema] [5:Lymphedema] [N/A:N/A] Comorbid History: [2:Lymphedema, Hypertension] [5:Lymphedema, Hypertension] [N/A:N/A] Date Acquired: [2:08/08/2017] [5:04/07/2018] [N/A:N/A] Weeks of Treatment: [2:38] [5:8] [N/A:N/A] Wound Status: [2:Open] [5:Open] [N/A:N/A] Clustered Wound: [2:Yes] [5:No] [N/A:N/A] Clustered Quantity: [2:3] [5:N/A] [N/A:N/A] Measurements L x W x D [2:1.1x0.3x0.1] [5:0.4x0.4x0.1] [N/A:N/A] (cm) Area (cm) : [2:0.259] [5:0.126] [N/A:N/A] Volume (cm) : [2:0.026] [5:0.013] [N/A:N/A] % Reduction in Area: [2:98.80%] [5:98.40%] [N/A:N/A] % Reduction in Volume: [2:99.60%] [5:99.70%] [N/A:N/A] Classification: [2:Full Thickness With Exposed Support Structures] [5:Full Thickness Without Exposed Support Structures] [N/A:N/A] Exudate Amount: [2:Medium] [5:Medium] [N/A:N/A] Exudate Type: [2:Serous] [5:Serous] [N/A:N/A] Exudate Color: [2:amber] [5:amber] [N/A:N/A] Wound Margin: [2:Flat and Intact] [5:Flat and Intact] [N/A:N/A] Granulation Amount:  [2:Large (67-100%)] [5:Large (67-100%)] [N/A:N/A] Granulation Quality: [2:Pink] [5:Pink] [N/A:N/A] Necrotic Amount: [2:Small (1-33%)] [5:Small (1-33%)] [N/A:N/A] Necrotic Tissue: [2:Eschar, Adherent Slough] [5:Adherent Slough] [N/A:N/A] Exposed Structures: [2:Fat Layer (Subcutaneous Tissue) Exposed: Yes Fascia: No Tendon: No Muscle: No Joint: No Bone: No] [5:Fat Layer (Subcutaneous Tissue) Exposed: Yes Fascia: No Tendon: No Muscle: No Joint: No Bone: No] [N/A:N/A] Epithelialization: [2:None] [5:None] [N/A:N/A] Periwound Skin Texture: [2:Excoriation: No Induration: No] [5:Excoriation: No Induration: No] [N/A:N/A] Callus: No Callus: No Crepitus: No Crepitus: No Rash: No Rash: No Scarring: No Scarring: No Periwound Skin Moisture: Maceration: No Maceration: No N/A Dry/Scaly: No Dry/Scaly: No Periwound Skin Color: Atrophie Blanche: No Atrophie Blanche: No N/A Cyanosis: No Cyanosis: No Ecchymosis: No Ecchymosis: No Erythema: No Erythema: No Hemosiderin Staining: No Hemosiderin Staining: No Mottled: No Mottled: No Pallor: No Pallor: No Rubor: No Rubor: No Temperature: No Abnormality No Abnormality N/A Tenderness on Palpation: No No N/A Wound Preparation: Ulcer Cleansing: Other: soap Ulcer Cleansing: N/A and water Rinsed/Irrigated with Saline, Other: soap and water Topical Anesthetic Applied: Other: lidocaine 4% Topical Anesthetic Applied: Other: lidocaine 4% Treatment Notes Electronic Signature(s) Signed: 06/03/2018 5:44:59 PM By: Renne Crigler Entered By: Renne Crigler on 06/03/2018 09:30:45 [name] ([id number]) -------------------------------------------------------------------------------- Multi-Disciplinary Care Plan Details Patient Name: Jessica Casey R. Date of Service: 06/03/2018 9:15 AM Medical Record Number: [id number] Patient Account Number: [id number] Date of Birth/Sex: 1981-09-19 (37 y.o. F) Treating RN: Renne Crigler Primary Care Reann Dobias:  PATIENT, NO Other Clinician: Referring Berthel Bagnall: Referral, Self Treating Kaizer Dissinger/Extender: STONE III, HOYT Weeks in Treatment: 39 Active Inactive ` Orientation to the Wound Care Program Nursing Diagnoses: Knowledge deficit related to the wound healing center program Goals: Patient/caregiver will verbalize understanding of the Wound Healing Center Program Date Initiated: 09/10/2017 Target Resolution Date: 11/23/2017 Goal Status: Active Interventions: Provide education on orientation to the wound center Notes: ` Venous Leg Ulcer Nursing Diagnoses: Knowledge deficit related to disease process and management Goals: Patient/caregiver will verbalize understanding of disease process and disease management Date Initiated: 09/10/2017 Target Resolution Date: 11/23/2017 Goal Status: Active Interventions: Assess peripheral edema status every visit. Notes: ` Wound/Skin Impairment Nursing Diagnoses: Impaired tissue integrity Goals: Ulcer/skin breakdown will heal within 14 weeks Date Initiated: 09/10/2017 Target Resolution Date: 11/24/2017 Goal Status: Active Interventions: RUTHELLA, KIRCHMAN ([id number]) Assess patient/caregiver ability to obtain necessary supplies Assess patient/caregiver ability to perform ulcer/skin care regimen upon admission and as needed Assess ulceration(s) every visit Notes: Electronic Signature(s) Signed: 06/03/2018 5:44:59 PM By: Renne Crigler Entered By: Renne Crigler on 06/03/2018 09:30:34 Hollenbaugh, Ember R. ([id number]) -------------------------------------------------------------------------------- Pain Assessment Details Patient Name: Jessica Casey R.  Date of Service: 06/03/2018 9:15 AM Medical Record Number: [id number] Patient Account Number: [id number] Date of Birth/Sex: June 14, 1981 (37 y.o. F) Treating RN: Curtis Sites Primary Care Kerri Kovacik: PATIENT, NO Other Clinician: Referring Scarlett Portlock: Referral, Self Treating Madaleine Simmon/Extender: STONE III,  HOYT Weeks in Treatment: 38 Active Problems Location of Pain Severity and Description of Pain Patient Has Paino No Site Locations Pain Management and Medication Current Pain Management: Electronic Signature(s) Signed: 06/03/2018 5:53:18 PM By: Curtis Sites Entered By: Curtis Sites on 06/03/2018 08:50:43 Brinson, Cristal Deer ([id number]) -------------------------------------------------------------------------------- Patient/Caregiver Education Details Patient Name: Jessica Casey R. Date of Service: 06/03/2018 9:15 AM Medical Record Number: [id number] Patient Account Number: [id number] Date of Birth/Gender: 1980-12-31 (37 y.o. F) Treating RN: Phillis Haggis Primary Care Physician: PATIENT, NO Other Clinician: Referring Physician: Referral, Self Treating Physician/Extender: Linwood Dibbles, HOYT Weeks in Treatment: 56 Education Assessment Education Provided To: Patient Education Topics Provided Wound/Skin Impairment: Handouts: Caring for Your Ulcer, Skin Care Do's and Dont's, Other: change dressing as ordered Methods: Demonstration, Explain/Verbal Responses: State content correctly Electronic Signature(s) Signed: 06/03/2018 5:41:08 PM By: [name] Entered By: [name] on 06/03/2018 10:31:32 Shanley, Hermela R. ([id number]) -------------------------------------------------------------------------------- Wound Assessment Details Patient Name: Jessica Casey R. Date of Service: 06/03/2018 9:15 AM Medical Record Number: [id number] Patient Account Number: [id number] Date of Birth/Sex: 04-12-1981 (37 y.o. F) Treating RN: Curtis Sites Primary Care Xena Propst: PATIENT, NO Other Clinician: Referring Cyril Railey: Referral, Self Treating Geovanie Winnett/Extender: STONE III, HOYT Weeks in Treatment: 38 Wound Status Wound Number: 2 Primary Etiology: Lymphedema Wound Location: Right Lower Leg - Lateral, Proximal Wound Status: Open Wounding Event: Gradually Appeared Comorbid History: Lymphedema,  Hypertension Date Acquired: 08/08/2017 Weeks Of Treatment: 38 Clustered Wound: Yes Photos Photo Uploaded By: [name] on 06/03/2018 11:28:12 Wound Measurements Length: (cm) 1.1 Width: (cm) 0.3 Depth: (cm) 0.1 Clustered Quantity: 3 Area: (cm) 0.259 Volume: (cm) 0.026 % Reduction in Area: 98.8% % Reduction in Volume: 99.6% Epithelialization: None Tunneling: No Undermining: No Wound Description Full Thickness With Exposed Support Classification: Structures Wound Margin: Flat and Intact Exudate Medium Amount: Exudate Type: Serous Exudate Color: amber Foul Odor After Cleansing: No Slough/Fibrino Yes Wound Bed Granulation Amount: Large (67-100%) Exposed Structure Granulation Quality: Pink Fascia Exposed: No Necrotic Amount: Small (1-33%) Fat Layer (Subcutaneous Tissue) Exposed: Yes Necrotic Quality: Eschar, Adherent Slough Tendon Exposed: No Muscle Exposed: No Joint Exposed: No Glaze, Lorraina R. ([id number]) Bone Exposed: No Periwound Skin Texture Texture Color No Abnormalities Noted: No No Abnormalities Noted: No Callus: No Atrophie Blanche: No Crepitus: No Cyanosis: No Excoriation: No Ecchymosis: No Induration: No Erythema: No Rash: No Hemosiderin Staining: No Scarring: No Mottled: No Pallor: No Moisture Rubor: No No Abnormalities Noted: No Dry / Scaly: No Temperature / Pain Maceration: No Temperature: No Abnormality Wound Preparation Ulcer Cleansing: Other: soap and water, Topical Anesthetic Applied: Other: lidocaine 4%, Treatment Notes Wound #2 (Right, Proximal, Lateral Lower Leg) 1. Cleansed with: Clean wound with Normal Saline Cleanse wound with antibacterial soap and water 2. Anesthetic Topical Lidocaine 4% cream to wound bed prior to debridement 3. Peri-wound Care: Moisturizing lotion 4. Dressing Applied: Hydrafera Blue 5. Secondary Dressing Applied ABD Pad 7. Secured with Tape 4 Layer Compression System -  Bilateral Notes unna to anchor Electronic Signature(s) Signed: 06/03/2018 5:53:18 PM By: Curtis Sites Entered By: Curtis Sites on 06/03/2018 09:03:57 Zettlemoyer, Nicolena R. ([id number]) -------------------------------------------------------------------------------- Wound Assessment Details Patient Name: Jessica Casey R. Date of Service: 06/03/2018 9:15 AM Medical Record Number: [id number] Patient Account Number: [id number] Date of Birth/Sex: [birth year] (37 y.o. F) Treating  RN: Curtis Sitesorthy, Joanna Primary Care Jevante Hollibaugh: PATIENT, NO Other Clinician: Referring Tyeson Tanimoto: Referral, Self Treating Giovonnie Trettel/Extender: STONE III, HOYT Weeks in Treatment: 38 Wound Status Wound Number: 5 Primary Etiology: Lymphedema Wound Location: Left Lower Leg - Anterior Wound Status: Open Wounding Event: Gradually Appeared Comorbid History: Lymphedema, Hypertension Date Acquired: 04/07/2018 Weeks Of Treatment: 8 Clustered Wound: No Photos Photo Uploaded By: Alejandro MullingPinkerton, Debra on 06/03/2018 11:28:24 Wound Measurements Length: (cm) 0.4 Width: (cm) 0.4 Depth: (cm) 0.1 Area: (cm) 0.126 Volume: (cm) 0.013 % Reduction in Area: 98.4% % Reduction in Volume: 99.7% Epithelialization: None Tunneling: No Undermining: No Wound Description Full Thickness Without Exposed Support Classification: Structures Wound Margin: Flat and Intact Exudate Medium Amount: Exudate Type: Serous Exudate Color: amber Foul Odor After Cleansing: No Slough/Fibrino Yes Wound Bed Granulation Amount: Large (67-100%) Exposed Structure Granulation Quality: Pink Fascia Exposed: No Necrotic Amount: Small (1-33%) Fat Layer (Subcutaneous Tissue) Exposed: Yes Necrotic Quality: Adherent Slough Tendon Exposed: No Muscle Exposed: No Joint Exposed: No Bone Exposed: No Radke, Marrion R. ([id number]) Periwound Skin Texture Texture Color No Abnormalities Noted: No No Abnormalities Noted: No Callus: No Atrophie Blanche: No Crepitus:  No Cyanosis: No Excoriation: No Ecchymosis: No Induration: No Erythema: No Rash: No Hemosiderin Staining: No Scarring: No Mottled: No Pallor: No Moisture Rubor: No No Abnormalities Noted: No Dry / Scaly: No Temperature / Pain Maceration: No Temperature: No Abnormality Wound Preparation Ulcer Cleansing: Rinsed/Irrigated with Saline, Other: soap and water, Topical Anesthetic Applied: Other: lidocaine 4%, Treatment Notes Wound #5 (Left, Anterior Lower Leg) 1. Cleansed with: Clean wound with Normal Saline Cleanse wound with antibacterial soap and water 2. Anesthetic Topical Lidocaine 4% cream to wound bed prior to debridement 3. Peri-wound Care: Moisturizing lotion 4. Dressing Applied: Hydrafera Blue 5. Secondary Dressing Applied ABD Pad 7. Secured with Tape 4 Layer Compression System - Bilateral Notes unna to anchor Electronic Signature(s) Signed: 06/03/2018 5:53:18 PM By: Curtis Sitesorthy, Joanna Entered By: Curtis Sitesorthy, Joanna on 06/03/2018 09:04:22 Marlaine HindLEATH, Ellory R. ([id number]) -------------------------------------------------------------------------------- Vitals Details Patient Name: Jessica CaseyLEATH, Iverna R. Date of Service: 06/03/2018 9:15 AM Medical Record Number: [id number] Patient Account Number: [id number] Date of Birth/Sex: 17-May-1981 (37 y.o. F) Treating RN: Curtis Sitesorthy, Joanna Primary Care Danney Bungert: PATIENT, NO Other Clinician: Referring Muhammadali Ries: Referral, Self Treating Clora Ohmer/Extender: STONE III, HOYT Weeks in Treatment: 38 Vital Signs Time Taken: 08:50 Temperature (F): 98.2 Height (in): 74 Pulse (bpm): 94 Weight (lbs): 486 Respiratory Rate (breaths/min): 22 Body Mass Index (BMI): 62.4 Blood Pressure (mmHg): 135/96 Reference Range: 80 - 120 mg / dl Electronic Signature(s) Signed: 06/03/2018 5:53:18 PM By: Curtis Sitesorthy, Joanna Entered By: Curtis Sitesorthy, Joanna on 06/03/2018 08:51:13

## 2018-06-04 NOTE — Progress Notes (Signed)
KEIARA, [name] ([id number]) Visit Report for 06/03/2018 Chief Complaint Document Details Patient Name: [name], [name] Date of Service: 06/03/2018 9:15 AM Medical Record Number: [id number] Patient Account Number: [id number] Date of Birth/Sex: 1981/06/03 (37 y.o. F) Treating RN: Renne Crigler Primary Care Provider: PATIENT, NO Other Clinician: Referring Provider: Referral, Self Treating Provider/Extender: STONE III,  Weeks in Treatment: 23 Information Obtained from: Patient Chief Complaint She is here in follow up evaluation of right lower extremity ulcers Electronic Signature(s) Signed: 06/04/2018 1:18:11 PM By: Lenda Kelp PA-C Entered By: Lenda Kelp on 06/03/2018 08:37:56 Wrightsman, Anner R. ([id number]) -------------------------------------------------------------------------------- HPI Details Patient Name: [name] Date of Service: 06/03/2018 9:15 AM Medical Record Number: [id number] Patient Account Number: [id number] Date of Birth/Sex: 05/07/1981 (37 y.o. F) Treating RN: Renne Crigler Primary Care Provider: PATIENT, NO Other Clinician: Referring Provider: Referral, Self Treating Provider/Extender: STONE III,  Weeks in Treatment: 38 History of Present Illness HPI Description: 37 year old patient well known to our [facility] wound care clinic where she has been seen since 2016 for bilateral lower extremity venous insufficiency disease with lymphedema and multiple ulcerations associated with morbid obesity. she had custom-made compression stockings and lymphedema pumps which were used in the past. most recently she was admitted to the hospital between October 11 and 09/02/2017 with sepsis, lower extremity wounds and lymphedema.she was initially treated in the outpatient with Keflex and Bactrim. she was initially treated in the hospital with vancomycin and Zosyn and changed over to Unasyn until her white count improved and her blood cultures were negative for 3  days. After her inpatient management she was discharged home on Augmentin to end on 09/13/2017 with a 14 day course. she has had outpatient vascular duplex scans completed in November 2017 and her right ABI was 1.1 and the left ABI is 1.3. she had normal toe brachial indices bilaterally.she had three-vessel runoff in the right lower extremity and two-vessel runoff in the left lower extremity. On questioning the patient she does have custom made compression stockings and also has a lymphedema pump but has not been using it appropriately and has not been taking good care of herself. 09/17/2017 -- she returns today with compression stockings on the left side and the right side has had significant amount of drainage and has a very strong odor 09/24/2017 -- the drainage is increased significantly and she has more lymphedema and a very strong odor to her wound. Though she does not have systemic symptoms, or overt infection I believe she will benefit from some doxycycline given empirically. 10/01/2017 -- after starting the doxycycline and changing the dressing twice a week her symptoms and signs have definitely improved overall. 10/08/2017 -- she has completed her course of doxycycline but continues to have a lot of drainage and needs twice a week dressing changes. 11/08/17-she is here in follow-up evaluation for right lower extremity ulcers. She admits to using her lymphedema pumps twice daily, one hour per session. she is voicing no complaints or concerns, no signs of infection will change to [facility] 12/14/17 on evaluation today patient appears to be doing very well in regard to her wounds. She has been tolerating the dressing changes she continues to develop some portly the adherent granular tissue on the surface of the wound with some Slough. Obviously we are trying to get too much better wound bed. With that being said the hyper granulation the Hydrofera Blue Dressing to have helped with which is  excellent news. However I think it may be time  to try something a little bit different at this point. 01/11/18 on evaluation today patient appears to be doing fairly well in regard to her right lateral lower extremity ulcers. This shows excellent signs of filling in which is great news. There does not appear to be any evidence of infection which is also good news. She does continue to work as well is good school. She is having no pain. 01/22/18 on evaluation today patient appears to be doing a little bit worse in my opinion in regard to the overall quality of that granulation on her right lower extremity. She was not here last week due to being sick with a stomach virus this may have something to do with the fact that her wound appears to be a little bit worse. With that being said I'm also thinking that after switching from the [facility] Dressing to the silver collagen would really has not looked that's good in my opinion. We may want to swit 02/11/18 on evaluation today patient's right lower/lateral lower extremity ulcers appear to be doing very well at this point. [name], [name] ([id number]) Especially the more proximal ulcer has filled in much closer to surface which is good news. Nonetheless both show signs of improvement which is great news. There does not appear to be any evidence of infection which is also good news. In general patient has been doing well tolerating the wraps as well as the [employer]. 02/18/18 on evaluation today patient appears to be doing a little bit worse in regard to the periwound region the wounds themselves do not look much deteriorated to me. With that being said she has several small blisters/pustules noted in the periwound and there was a significant amount of drainage and maceration compared to previous. There has been a time that we had to bring her back for twice a week dressing changes as far as her wrap was concerned it has been a while  since we've done that however. With that being said the patient has been having some burning and in general I'm concerned about the possibility of infection. She has previously taken doxycycline with good result. Fortunately there does not appear to be any evidence of overall worsening in regard to the size of the wound and in fact the upper wound actually appears to be showing signs of good epithelialization. 03/18/18 on evaluation today patient appears to be doing excellent in regard to her right lateral lower extremity ulcer. She has been tolerating the dressing changes without complication. Fortunately this seems to be making great progress. Overall I see no signs of infection and there is dramatic improvement overall even compared to last week. 03/28/18 on evaluation today patient appears to be doing very well in regard to her right lateral lower surety ulcer. She has been tolerating the dressing changes without complication at this point. She states currently that she's having no significant discomfort which is excellent as well. Overall I'm pleased with how things seem to be progressing. 04/01/18 on evaluation today patient appears to be doing excellent in regard to her right lateral lower extremity ulcers. She has been tolerating the dressing changes without complication which is good news. With that being said the wraps still continues to show signs of helping with her fluid she does have Juxta-Lite compression stockings for when we are done with the current treatment regimen once everything heals. Mainly she just has the one area still remaining the smaller of the two wings is pretty much closed  at this point there's just a very slight opening noted. 04/08/18 on evaluation today patient appears to be doing better in regard to the original wound on the right lateral lower extremity that we have been managing. Unfortunately she has a new area of weeping more anterior on the right lateral  lower extremity and on the left lower extremity she has two new ulcers there appears to be some cellulitis noted at this time. I am concerned about the fact that this may in fact be an infection that has caused the worsening and swelling in the past this has been the case when we previously attempted to determine what was going on when she had down slides like this. With that being said the patient is seeming to tolerate the wraps fairly well for the most part. 04/17/18; since last time the patient was seen in this clinic she was hospitalized from 04/11/18 through 04/13/18; she presented with bilateral lower extremity pain worse on the right and a fever of up to 104. Noteworthy that when she was in the clinic last week she had new wounds on the left leg culture grew MRSA and she was prescribed Bactrim. She had 2 days of IV Vanco and Zosyn in the hospital. Her blood cultures were negative. She was discharged on Bactrim to cover the original MRSA on the right leg and Keflex to cover the possibility of strep. The hospitalist had a conversation with infectious disease. The patient arrives in clinic today for nurse check however given the recent hospitalization I was asked to look at her. The patient states she feels a lot better. No fever or chills. Still some pain in the right calf but a lot better. She arrived in the hospital with a white count of 15.6, the next day was 10.8. Comprehensive metabolic panel was normal. She is still taking Keflex and Bactrim It would appear that she had a surgical IandD at the bedside of the right calf felt to have a underlying abscess. According to our intake nurse the wound has expanded quite bit on the right lateral calf. She has no open area on the left anterior and left posterior calf as described last time 04/23/18 on evaluation today patient actually appears to be doing much better than when I last saw her. This obviously has been a couple weeks ago and in the interim  she was admitted to the hospital for IV antibiotic therapy due to cellulitis, discharge, and fortunately the substance which hadn't sued has completely resolved. Her swelling seems to be better in regard to her lower extremities as well and the wounds that opened up as results of the infection seems to be showing signs of improvement. There is some Slough noted on the left lateral wound otherwise a lot of weeping noted of the right lateral leg. 04/29/18 on evaluation today patient appears to be doing excellent in regard to her bilateral lower extremity ulcers. She's been tolerating the dressing changes without complication there does not appear to be any evidence of infection at this time. Overall I think she is made great improvement and seems to be showing signs of coming back around where she was prior to the cellulitis and sepsis episode. [name], [name] ([id number]) 05/06/18 on evaluation today patient appears to be doing better in regard to her bilateral ocean the ulcers. She's been tolerating the dressing changes without complication. Fortunately there does not appear to be any evidence of infection at this time. Her swelling is significantly down overall  seems to be showing signs of good improvement as well. Fortunately I do believe that she is progressing nicely back to where she was prior to the cellulitis/sepsis issue. 05/13/18 on evaluation today patient seems to be showing signs of great improvement she's been tolerating the dressing changes without complication and overall there does not appear to be any evidence of infection. All of which is good news. The only issue she really have today is that some of the [facility] Dressing actually was stuck to the periwound of the right lateral lower extremity however this was able to be removed during the debridement without complication. 05/21/18 on evaluation today patient appears to be doing very well in regard to her bilateral lower extremity  ulcers. She has been tolerating the compression wraps without complication. There does not appear to be any evidence of infection at this point which is good news as well. Overall I'm very pleased with the progress that has been made. She likewise is very happy. 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 05/28/18 on evaluation today patient actually appears to be doing excellent in regard to her bilateral lower [place] ulcers. She's made great progress since last week and overall I'm very pleased in this regard. She states that she's not have any discomfort either which is also good news there's definitely no evidence of infection. 06/03/18 on evaluation today patient actually appears to be doing excellent in regard to her ulcerations in fact these areas on both lower extremities were almost completely healed. Unfortunately she has a situation going on her life right now she actually found her husband deceased last 04/02/23. She states that she has been quite a bit upset since that time unfortunately this is obviously a very big blow to her. Nonetheless she is concerned about the funeral and having to wear certain shoes for this that she states she cannot fit in with the wraps. She wonders if she could potentially come back on Apr 02, 2023 to have her wraps changed and to switch into her Juxta-Lite compression at that point. I think that would be an okay thing that we could do for her. Nonetheless she fortunately seems to be tolerating the wraps very well and again has made excellent progress with the [facility] Dressing Electronic Signature(s) Signed: 06/04/2018 1:18:11 PM By: Lenda Kelp PA-C Entered By: Lenda Kelp on 06/03/2018 09:38:15 [name], [name] ([id number]) -------------------------------------------------------------------------------- Physical Exam Details Patient  Name: [name] R. Date of Service: 06/03/2018 9:15 AM Medical Record Number: [id number] Patient Account Number: [id number] Date of Birth/Sex: [birth year] (37 y.o. F) Treating RN: Renne Crigler Primary Care Provider: PATIENT, NO Other Clinician: Referring Provider: Referral, Self Treating Provider/Extender: STONE III,  Weeks in Treatment: 38 Constitutional Chronically ill appearing but in no apparent acute distress. Respiratory normal breathing without difficulty. clear to auscultation bilaterally. Cardiovascular regular rate and rhythm with normal S1, S2. 1+ pitting edema of the bilateral lower extremities. Psychiatric this patient is able to make decisions and demonstrates good insight into disease process. Alert and Oriented x 3. pleasant and cooperative. Notes Patient's wound at this time seems to show signs of excellent improvement which is great news she's been tolerating the dressing changes without complication there does not appear to be any evidence of infection at this time which is good news. Overall I'm very pleased with the progress that's been made up to this point. Electronic  Signature(s) Signed: 06/04/2018 1:18:11 PM By: Lenda KelpStone III,  PA-C Entered By: Lenda KelpStone III,  on 06/03/2018 09:38:58 Jessica HindLEATH, Camari R. ([id number]) -------------------------------------------------------------------------------- Physician Orders Details Patient Name: Jessica CaseyLEATH, Arlinda R. Date of Service: 06/03/2018 9:15 AM Medical Record Number: [id number] Patient Account Number: [id number] Date of Birth/Sex: 1981-05-13 (37 y.o. F) Treating RN: Renne CriglerFlinchum, Cheryl Primary Care Provider: PATIENT, NO Other Clinician: Referring Provider: Referral, Self Treating Provider/Extender: STONE III,  Weeks in Treatment: 5938 Verbal / Phone Orders: No Diagnosis Coding ICD-10 Coding Code Description 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d L97.221 Non-pressure chronic ulcer  of left calf limited to breakdown of skin I89.0 Lymphedema, not elsewhere classified I87.311 Chronic venous hypertension (idiopathic) with ulcer of right lower extremity E66.01 Morbid (severe) obesity due to excess calories Wound Cleansing Wound #2 Right,Proximal,Lateral Lower Leg o Clean wound with Normal Saline. o Cleanse wound with mild soap and water Wound #5 Left,Anterior Lower Leg o Clean wound with Normal Saline. o Cleanse wound with mild soap and water Anesthetic (add to Medication List) Wound #2 Right,Proximal,Lateral Lower Leg o Topical Lidocaine 4% cream applied to wound bed prior to debridement (In Clinic Only). Wound #5 Left,Anterior Lower Leg o Topical Lidocaine 4% cream applied to wound bed prior to debridement (In Clinic Only). Skin Barriers/Peri-Wound Care Wound #2 Right,Proximal,Lateral Lower Leg o Moisturizing lotion Wound #5 Left,Anterior Lower Leg o Moisturizing lotion Primary Wound Dressing Wound #2 Right,Proximal,Lateral Lower Leg o Hydrafera Blue Ready Transfer Wound #5 Left,Anterior Lower Leg o Hydrafera Blue Ready Transfer Secondary Dressing Wound #2 Right,Proximal,Lateral Lower Leg o ABD pad [name], [name] ([id number]) Wound #5 Left,Anterior Lower Leg o ABD pad Dressing Change Frequency Wound #2 Right,Proximal,Lateral Lower Leg o Change dressing every week Wound #5 Left,Anterior Lower Leg o Change dressing every week Follow-up Appointments Wound #2 Right,Proximal,Lateral Lower Leg o Return Appointment in 1 week. o Nurse Visit as needed o Other: - twice weekly clinic visit, return on friday for nurse visit Wound #5 Left,Anterior Lower Leg o Nurse Visit as needed o Other: - twice weekly clinic visit, return on friday for nurse visit Edema Control Wound #2 Right,Proximal,Lateral Lower Leg o 4 Layer Compression System - Bilateral - unna to anchor o Compression Pump: Use compression pump on left lower  extremity for 30 minutes, twice daily. - one hour use o Compression Pump: Use compression pump on right lower extremity for 30 minutes, twice daily. - one hour use o Other: - order juxtalites for bilateral legs Wound #5 Left,Anterior Lower Leg o 4 Layer Compression System - Bilateral - unna to anchor o Compression Pump: Use compression pump on left lower extremity for 30 minutes, twice daily. - one hour use o Compression Pump: Use compression pump on right lower extremity for 30 minutes, twice daily. - one hour use o Other: - order juxtalites for bilateral legs Additional Orders / Instructions o Vitamin A; Vitamin C, Zinc o Increase protein intake. Electronic Signature(s) Signed: 06/03/2018 5:44:59 PM By: Renne CriglerFlinchum, Cheryl Signed: 06/04/2018 1:18:11 PM By: Lenda KelpStone III,  PA-C Entered By: Renne CriglerFlinchum, Cheryl on 06/03/2018 09:38:27 [name], [name] ([id number]) -------------------------------------------------------------------------------- Problem List Details Patient Name: Jessica CaseyLEATH, Almadelia R. Date of Service: 06/03/2018 9:15 AM Medical Record Number: [id number] Patient Account Number: [id number] Date of Birth/Sex: 1981-05-13 (37 y.o. F) Treating RN: Renne CriglerFlinchum, Cheryl Primary Care Provider: PATIENT, NO Other Clinician: Referring Provider: Referral, Self Treating Provider/Extender: STONE III,  Weeks in Treatment: 38 Active Problems ICD-10 Evaluated Encounter Code Description Active Date Today Diagnosis L97.212 Non-pressure chronic ulcer  of right calf with fat layer exposed 09/10/2017 No Yes L97.221 Non-pressure chronic ulcer of left calf limited to breakdown of 04/17/2018 No Yes skin I89.0 Lymphedema, not elsewhere classified 09/10/2017 No Yes I87.311 Chronic venous hypertension (idiopathic) with ulcer of right 09/10/2017 No Yes lower extremity E66.01 Morbid (severe) obesity due to excess calories 09/10/2017 No Yes Inactive Problems Resolved Problems Electronic  Signature(s) Signed: 06/04/2018 1:18:11 PM By: Lenda Kelp PA-C Entered By: Lenda Kelp on 06/03/2018 08:37:49 [name], [name] ([id number]) -------------------------------------------------------------------------------- Progress Note Details Patient Name: [name] R. Date of Service: 06/03/2018 9:15 AM Medical Record Number: [id number] Patient Account Number: [id number] Date of Birth/Sex: [birth year] (37 y.o. F) Treating RN: Renne Crigler Primary Care Provider: PATIENT, NO Other Clinician: Referring Provider: Referral, Self Treating Provider/Extender: STONE III,  Weeks in Treatment: 38 Subjective Chief Complaint Information obtained from Patient She is here in follow up evaluation of right lower extremity ulcers History of Present Illness (HPI) 37 year old patient well known to our [facility] wound care clinic where she has been seen since 2016 for bilateral lower extremity venous insufficiency disease with lymphedema and multiple ulcerations associated with morbid obesity. she had custom-made compression stockings and lymphedema pumps which were used in the past. most recently she was admitted to the hospital between October 11 and 09/02/2017 with sepsis, lower extremity wounds and lymphedema.she was initially treated in the outpatient with Keflex and Bactrim. she was initially treated in the hospital with vancomycin and Zosyn and changed over to Unasyn until her white count improved and her blood cultures were negative for 3 days. After her inpatient management she was discharged home on Augmentin to end on 09/13/2017 with a 14 day course. she has had outpatient vascular duplex scans completed in November 2017 and her right ABI was 1.1 and the left ABI is 1.3. she had normal toe brachial indices bilaterally.she had three-vessel runoff in the right lower extremity and two-vessel runoff in the left lower extremity. On questioning the patient she does have custom made  compression stockings and also has a lymphedema pump but has not been using it appropriately and has not been taking good care of herself. 09/17/2017 -- she returns today with compression stockings on the left side and the right side has had significant amount of drainage and has a very strong odor 09/24/2017 -- the drainage is increased significantly and she has more lymphedema and a very strong odor to her wound. Though she does not have systemic symptoms, or overt infection I believe she will benefit from some doxycycline given empirically. 10/01/2017 -- after starting the doxycycline and changing the dressing twice a week her symptoms and signs have definitely improved overall. 10/08/2017 -- she has completed her course of doxycycline but continues to have a lot of drainage and needs twice a week dressing changes. 11/08/17-she is here in follow-up evaluation for right lower extremity ulcers. She admits to using her lymphedema pumps twice daily, one hour per session. she is voicing no complaints or concerns, no signs of infection will change to [facility] 12/14/17 on evaluation today patient appears to be doing very well in regard to her wounds. She has been tolerating the dressing changes she continues to develop some portly the adherent granular tissue on the surface of the wound with some Slough. Obviously we are trying to get too much better wound bed. With that being said the hyper granulation the Hydrofera Blue Dressing to have helped with which is excellent news. However I think it may be  time to try something a little bit different at this point. 01/11/18 on evaluation today patient appears to be doing fairly well in regard to her right lateral lower extremity ulcers. This shows excellent signs of filling in which is great news. There does not appear to be any evidence of infection which is also good news. She does continue to work as well is good school. She is having no pain. 01/22/18 on  evaluation today patient appears to be doing a little bit worse in my opinion in regard to the overall quality of that Majerus, Meranda R. ([id number]) granulation on her right lower extremity. She was not here last week due to being sick with a stomach virus this may have something to do with the fact that her wound appears to be a little bit worse. With that being said I'm also thinking that after switching from the [facility] [facility] D[facility] to the silver collagen would really has not looked that's good in my opinion. We may want to swit 02/11/18 on evaluation today patient's right lower/lateral lower extremity ulcers appear to be doing very well at this point. Especially the more proximal ulcer has filled in much closer to surface which is good news. Nonetheless both show signs of improvement which is great news. There does not appear to be any evidence of infection which is also good news. In general patient has been doing well tolerating the wraps as well as the [employer]. 02/18/18 on evaluation today patient appears to be doing a little bit worse in regard to the periwound region the wounds themselves do not look much deteriorated to me. With that being said she has several small blisters/pustules noted in the periwound and there was a significant amount of drainage and maceration compared to previous. There has been a time that we had to bring her back for twice a week dressing changes as far as her wrap was concerned it has been a while since we've done that however. With that being said the patient has been having some burning and in general I'm concerned about the possibility of infection. She has previously taken doxycycline with good result. Fortunately there does not appear to be any evidence of overall worsening in regard to the size of the wound and in fact the upper wound actually appears to be showing signs of good epithelialization. 03/18/18 on evaluation today patient appears  to be doing excellent in regard to her right lateral lower extremity ulcer. She has been tolerating the dressing changes without complication. Fortunately this seems to be making great progress. Overall I see no signs of infection and there is dramatic improvement overall even compared to last week. 03/28/18 on evaluation today patient appears to be doing very well in regard to her right lateral lower surety ulcer. She has been tolerating the dressing changes without complication at this point. She states currently that she's having no significant discomfort which is excellent as well. Overall I'm pleased with how things seem to be progressing. 04/01/18 on evaluation today patient appears to be doing excellent in regard to her right lateral lower extremity ulcers. She has been tolerating the dressing changes without complication which is good news. With that being said the wraps still continues to show signs of helping with her fluid she does have Juxta-Lite compression stockings for when we are done with the current treatment regimen once everything heals. Mainly she just has the one area still remaining the smaller of the two wings is pretty  much closed at this point there's just a very slight opening noted. 04/08/18 on evaluation today patient appears to be doing better in regard to the original wound on the right lateral lower extremity that we have been managing. Unfortunately she has a new area of weeping more anterior on the right lateral lower extremity and on the left lower extremity she has two new ulcers there appears to be some cellulitis noted at this time. I am concerned about the fact that this may in fact be an infection that has caused the worsening and swelling in the past this has been the case when we previously attempted to determine what was going on when she had down slides like this. With that being said the patient is seeming to tolerate the wraps fairly well for the most  part. 04/17/18; since last time the patient was seen in this clinic she was hospitalized from 04/11/18 through 04/13/18; she presented with bilateral lower extremity pain worse on the right and a fever of up to 104. Noteworthy that when she was in the clinic last week she had new wounds on the left leg culture grew MRSA and she was prescribed Bactrim. She had 2 days of IV Vanco and Zosyn in the hospital. Her blood cultures were negative. She was discharged on Bactrim to cover the original MRSA on the right leg and Keflex to cover the possibility of strep. The hospitalist had a conversation with infectious disease. The patient arrives in clinic today for nurse check however given the recent hospitalization I was asked to look at her. The patient states she feels a lot better. No fever or chills. Still some pain in the right calf but a lot better. She arrived in the hospital with a white count of 15.6, the next day was 10.8. Comprehensive metabolic panel was normal. She is still taking Keflex and Bactrim It would appear that she had a surgical IandD at the bedside of the right calf felt to have a underlying abscess. According to our intake nurse the wound has expanded quite bit on the right lateral calf. She has no open area on the left anterior and left posterior calf as described last time 04/23/18 on evaluation today patient actually appears to be doing much better than when I last saw her. This obviously has been a couple weeks ago and in the interim she was admitted to the hospital for IV antibiotic therapy due to cellulitis, discharge, and fortunately the substance which hadn't sued has completely resolved. Her swelling seems to be better in regard to her lower extremities as well and the wounds that opened up as results of the infection seems to be showing signs of improvement. There is some Slough noted on the left lateral wound otherwise a lot of weeping noted of the right lateral leg. AMARIONNA, ARCA ([id number]) 04/29/18 on evaluation today patient appears to be doing excellent in regard to her bilateral lower extremity ulcers. She's been tolerating the dressing changes without complication there does not appear to be any evidence of infection at this time. Overall I think she is made great improvement and seems to be showing signs of coming back around where she was prior to the cellulitis and sepsis episode. 05/06/18 on evaluation today patient appears to be doing better in regard to her bilateral ocean the ulcers. She's been tolerating the dressing changes without complication. Fortunately there does not appear to be any evidence of infection at this time. Her swelling is significantly  down overall seems to be showing signs of good improvement as well. Fortunately I do believe that she is progressing nicely back to where she was prior to the cellulitis/sepsis issue. 05/13/18 on evaluation today patient seems to be showing signs of great improvement she's been tolerating the dressing changes without complication and overall there does not appear to be any evidence of infection. All of which is good news. The only issue she really have today is that some of the [facility] Dressing actually was stuck to the periwound of the right lateral lower extremity however this was able to be removed during the debridement without complication. 05/21/18 on evaluation today patient appears to be doing very well in regard to her bilateral lower extremity ulcers. She has been tolerating the compression wraps without complication. There does not appear to be any evidence of infection at this point which is good news as well. Overall I'm very pleased with the progress that has been made. She likewise is very happy. 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 05/28/18 on evaluation today  patient actually appears to be doing excellent in regard to her bilateral lower [place] ulcers. She's made great progress since last week and overall I'm very pleased in this regard. She states that she's not have any discomfort either which is also good news there's definitely no evidence of infection. 06/03/18 on evaluation today patient actually appears to be doing excellent in regard to her ulcerations in fact these areas on both lower extremities were almost completely healed. Unfortunately she has a situation going on her life right now she actually found her husband deceased last April 29, 2023. She states that she has been quite a bit upset since that time unfortunately this is obviously a very big blow to her. Nonetheless she is concerned about the funeral and having to wear certain shoes for this that she states she cannot fit in with the wraps. She wonders if she could potentially come back on 04/29/2023 to have her wraps changed and to switch into her Juxta-Lite compression at that point. I think that would be an okay thing that we could do for her. Nonetheless she fortunately seems to be tolerating the wraps very well and again has made excellent progress with the [facility] Dressing Patient History Information obtained from Patient. Family History Kidney Disease - Mother, No family history of Cancer, Diabetes, Heart Disease, Hereditary Spherocytosis, Hypertension, Lung Disease, Seizures, Stroke, Thyroid Problems, Tuberculosis. Social History Never smoker, Marital Status - Married, Alcohol Use - Never, Drug Use - No History, Caffeine Use - Daily. Review of Systems (ROS) Constitutional Symptoms (General Health) Denies complaints or symptoms of Fever, Chills. Respiratory The patient has no complaints or symptoms. Cardiovascular The patient has no complaints or symptoms. Psychiatric The patient has no complaints or symptoms. Musto, Zurisadai R.  ([id number]) Objective Constitutional Chronically ill appearing but in no apparent acute distress. Vitals Time Taken: 8:50 AM, Height: 74 in, Weight: 486 lbs, BMI: 62.4, Temperature: 98.2 F, Pulse: 94 bpm, Respiratory Rate: 22 breaths/min, Blood Pressure: 135/96 mmHg. Respiratory normal breathing without difficulty. clear to auscultation bilaterally. Cardiovascular regular rate and rhythm with normal S1, S2. 1+ pitting edema of the bilateral lower extremities. Psychiatric this patient is able to make decisions and demonstrates good insight into disease process. Alert and Oriented x 3. pleasant and cooperative. General Notes: Patient's wound at this time seems to show  signs of excellent improvement which is great news she's been tolerating the dressing changes without complication there does not appear to be any evidence of infection at this time which is good news. Overall I'm very pleased with the progress that's been made up to this point. Integumentary (Hair, Skin) Wound #2 status is Open. Original cause of wound was Gradually Appeared. The wound is located on the Right,Proximal,Lateral Lower Leg. The wound measures 1.1cm length x 0.3cm width x 0.1cm depth; 0.259cm^2 area and 0.026cm^3 volume. There is Fat Layer (Subcutaneous Tissue) Exposed exposed. There is no tunneling or undermining noted. There is a medium amount of serous drainage noted. The wound margin is flat and intact. There is large (67-100%) pink granulation within the wound bed. There is a small (1-33%) amount of necrotic tissue within the wound bed including Eschar and Adherent Slough. The periwound skin appearance did not exhibit: Callus, Crepitus, Excoriation, Induration, Rash, Scarring, Dry/Scaly, Maceration, Atrophie Blanche, Cyanosis, Ecchymosis, Hemosiderin Staining, Mottled, Pallor, Rubor, Erythema. Periwound temperature was noted as No Abnormality. Wound #5 status is Open. Original cause of wound was Gradually  Appeared. The wound is located on the Left,Anterior Lower Leg. The wound measures 0.4cm length x 0.4cm width x 0.1cm depth; 0.126cm^2 area and 0.013cm^3 volume. There is Fat Layer (Subcutaneous Tissue) Exposed exposed. There is no tunneling or undermining noted. There is a medium amount of serous drainage noted. The wound margin is flat and intact. There is large (67-100%) pink granulation within the wound bed. There is a small (1-33%) amount of necrotic tissue within the wound bed including Adherent Slough. The periwound skin appearance did not exhibit: Callus, Crepitus, Excoriation, Induration, Rash, Scarring, Dry/Scaly, Maceration, Atrophie Blanche, Cyanosis, Ecchymosis, Hemosiderin Staining, Mottled, Pallor, Rubor, Erythema. Periwound temperature was noted as No Abnormality. Assessment Active Problems ICD-10 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d Whitmill, Gia R. ([id number]) Non-pressure chronic ulcer of left calf limited to breakdown of skin Lymphedema, not elsewhere classified Chronic venous hypertension (idiopathic) with ulcer of right lower extremity Morbid (severe) obesity due to excess calories Plan Wound Cleansing: Wound #2 Right,Proximal,Lateral Lower Leg: Clean wound with Normal Saline. Cleanse wound with mild soap and water Wound #5 Left,Anterior Lower Leg: Clean wound with Normal Saline. Cleanse wound with mild soap and water Anesthetic (add to Medication List): Wound #2 Right,Proximal,Lateral Lower Leg: Topical Lidocaine 4% cream applied to wound bed prior to debridement (In Clinic Only). Wound #5 Left,Anterior Lower Leg: Topical Lidocaine 4% cream applied to wound bed prior to debridement (In Clinic Only). Skin Barriers/Peri-Wound Care: Wound #2 Right,Proximal,Lateral Lower Leg: Moisturizing lotion Wound #5 Left,Anterior Lower Leg: Moisturizing lotion Primary Wound Dressing: Wound #2 Right,Proximal,Lateral Lower Leg: Hydrafera Blue Ready  Transfer Wound #5 Left,Anterior Lower Leg: Hydrafera Blue Ready Transfer Secondary Dressing: Wound #2 Right,Proximal,Lateral Lower Leg: ABD pad Wound #5 Left,Anterior Lower Leg: ABD pad Dressing Change Frequency: Wound #2 Right,Proximal,Lateral Lower Leg: Change dressing every week Wound #5 Left,Anterior Lower Leg: Change dressing every week Follow-up Appointments: Wound #2 Right,Proximal,Lateral Lower Leg: Return Appointment in 1 week. Nurse Visit as needed Other: - twice weekly clinic visit, return on friday for nurse visit Wound #5 Left,Anterior Lower Leg: Nurse Visit as needed Other: - twice weekly clinic visit, return on friday for nurse visit Edema Control: Wound #2 Right,Proximal,Lateral Lower Leg: 4 Layer Compression System - Bilateral - unna to anchor Compression Pump: Use compression pump on left lower extremity for 30 minutes, twice daily. - one hour use Compression Pump: Use compression pump  on right lower extremity for 30 minutes, twice daily. - one hour use Other: - order juxtalites for bilateral legs Wound #5 Left,Anterior Lower Leg: Bockrath, Analyn R. ([id number]) 4 Layer Compression System - Bilateral - unna to anchor Compression Pump: Use compression pump on left lower extremity for 30 minutes, twice daily. - one hour use Compression Pump: Use compression pump on right lower extremity for 30 minutes, twice daily. - one hour use Other: - order juxtalites for bilateral legs Additional Orders / Instructions: Vitamin A; Vitamin C, Zinc Increase protein intake. At this time I'm going to suggest that we continue with the Current wound care measures we will wrap her today and then subsequently see her back on Friday at which point we can take the wraps off for her and then put her into her Juxta-Lite compression wraps for the funeral. Depending on how things do we may just continue with the Juxta-Lite wraps we may need to switch back to the compression wraps following we  will see. Nonetheless obviously I wanted to help her anyway we could during this time that she's dealing with the tragedy. Nonetheless she seems to be holding up decently well she does have a family member with her today which is also good news. Please see above for specific wound care orders. We will see patient for re-evaluation in 1 week(s) here in the clinic. If anything worsens or changes patient will contact our office for additional recommendations. Electronic Signature(s) Signed: 06/04/2018 1:18:11 PM By: Lenda Kelp PA-C Entered By: Lenda Kelp on 06/03/2018 09:40:22 [name], [name] ([id number]) -------------------------------------------------------------------------------- ROS/PFSH Details Patient Name: [name] R. Date of Service: 06/03/2018 9:15 AM Medical Record Number: [id number] Patient Account Number: [id number] Date of Birth/Sex: 08-Jan-1981 (37 y.o. F) Treating RN: Renne Crigler Primary Care Provider: PATIENT, NO Other Clinician: Referring Provider: Referral, Self Treating Provider/Extender: STONE III,  Weeks in Treatment: 9 Information Obtained From Patient Wound History Do you currently have one or more open woundso Yes How many open wounds do you currently haveo 1 Approximately how long have you had your woundso 1 month How have you been treating your wound(s) until nowo aquacel ag Has your wound(s) ever healed and then re-openedo No Have you had any lab work done in the past montho No Have you tested positive for an antibiotic resistant organism (MRSA, VRE)o No Have you tested positive for osteomyelitis (bone infection)o No Have you had any tests for circulation on your legso Yes Who ordered the testo [facility] Where was the test doneo [facility] Constitutional Symptoms (General Health) Complaints and Symptoms: Negative for: Fever; Chills Hematologic/Lymphatic Medical History: Positive for: Lymphedema Respiratory Complaints and Symptoms: No  Complaints or Symptoms Medical History: Negative for: Aspiration; Asthma; Chronic Obstructive Pulmonary Disease (COPD); Pneumothorax; Sleep Apnea; Tuberculosis Cardiovascular Complaints and Symptoms: No Complaints or Symptoms Medical History: Positive for: Hypertension Negative for: Angina; Arrhythmia; Congestive Heart Failure; Coronary Artery Disease; Deep Vein Thrombosis; Hypotension; Myocardial Infarction; Peripheral Arterial Disease; Peripheral Venous Disease; Phlebitis; Vasculitis Psychiatric Complaints and Symptoms: No Complaints or Symptoms Barman, Neveen R. ([id number]) Immunizations Pneumococcal Vaccine: Received Pneumococcal Vaccination: No Immunization Notes: up to date Implantable Devices Family and Social History Cancer: No; Diabetes: No; Heart Disease: No; Hereditary Spherocytosis: No; Hypertension: No; Kidney Disease: Yes - Mother; Lung Disease: No; Seizures: No; Stroke: No; Thyroid Problems: No; Tuberculosis: No; Never smoker; Marital Status - Married; Alcohol Use: Never; Drug Use: No History; Caffeine Use: Daily; Financial Concerns: No; Food, Clothing or Shelter Needs: No; Support  System Lacking: No; Transportation Concerns: No; Advanced Directives: No; Patient does not want information on Advanced Directives Physician Affirmation I have reviewed and agree with the above information. Electronic Signature(s) Signed: 06/03/2018 5:44:59 PM By: Renne Crigler Signed: 06/04/2018 1:18:11 PM By: Lenda Kelp PA-C Entered By: Lenda Kelp on 06/03/2018 09:38:34 Eischeid, Marshawn R. ([id number]) -------------------------------------------------------------------------------- SuperBill Details Patient Name: [name] R. Date of Service: 06/03/2018 Medical Record Number: [id number] Patient Account Number: [id number] Date of Birth/Sex: 09/16/1981 (37 y.o. F) Treating RN: Renne Crigler Primary Care Provider: PATIENT, NO Other Clinician: Referring Provider: Referral,  Self Treating Provider/Extender: STONE III,  Weeks in Treatment: 38 Diagnosis Coding ICD-10 Codes Code Description L97.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d L97.221 Non-pressure chronic ulcer of left calf limited to breakdown of skin I89.0 Lymphedema, not elsewhere classified I87.311 Chronic venous hypertension (idiopathic) with ulcer of right lower extremity E66.01 Morbid (severe) obesity due to excess calories Facility Procedures CPT4 Code: 08657846 Description: 99213 - WOUND CARE VISIT-LEV 3 EST PT Modifier: Quantity: 1 Physician Procedures CPT4 Code: 9629528 Description: 99213 - WC PHYS LEVEL 3 - EST PT ICD-10 Diagnosis Description L97.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 L97.221 Non-pressure chronic ulcer of left calf limited to breakdown o I89.0 Lymphedema, not elsewhere  classified I87.311 Chronic venous hypertension (idiopathic) with ulcer of right l Modifier: d f skin ower extremity Quantity: 1 Electronic Signature(s) Signed: 06/04/2018 1:18:11 PM By: Lenda Kelp PA-C Entered By: Lenda Kelp on 06/03/2018 09:40:37

## 2018-06-07 ENCOUNTER — Ambulatory Visit: Payer: Self-pay

## 2018-06-11 ENCOUNTER — Encounter: Payer: Self-pay | Admitting: Physician Assistant

## 2018-12-09 IMAGING — DX DG TIBIA/FIBULA 2V*L*
4 series · 4 of 4 positions shown · non-contrast
Comparison: None.

CLINICAL DATA: Bilateral LOWER extremity cellulitis.

EXAM:
LEFT TIBIA AND FIBULA - 2 VIEW

[tibia ap (1 of 2)]
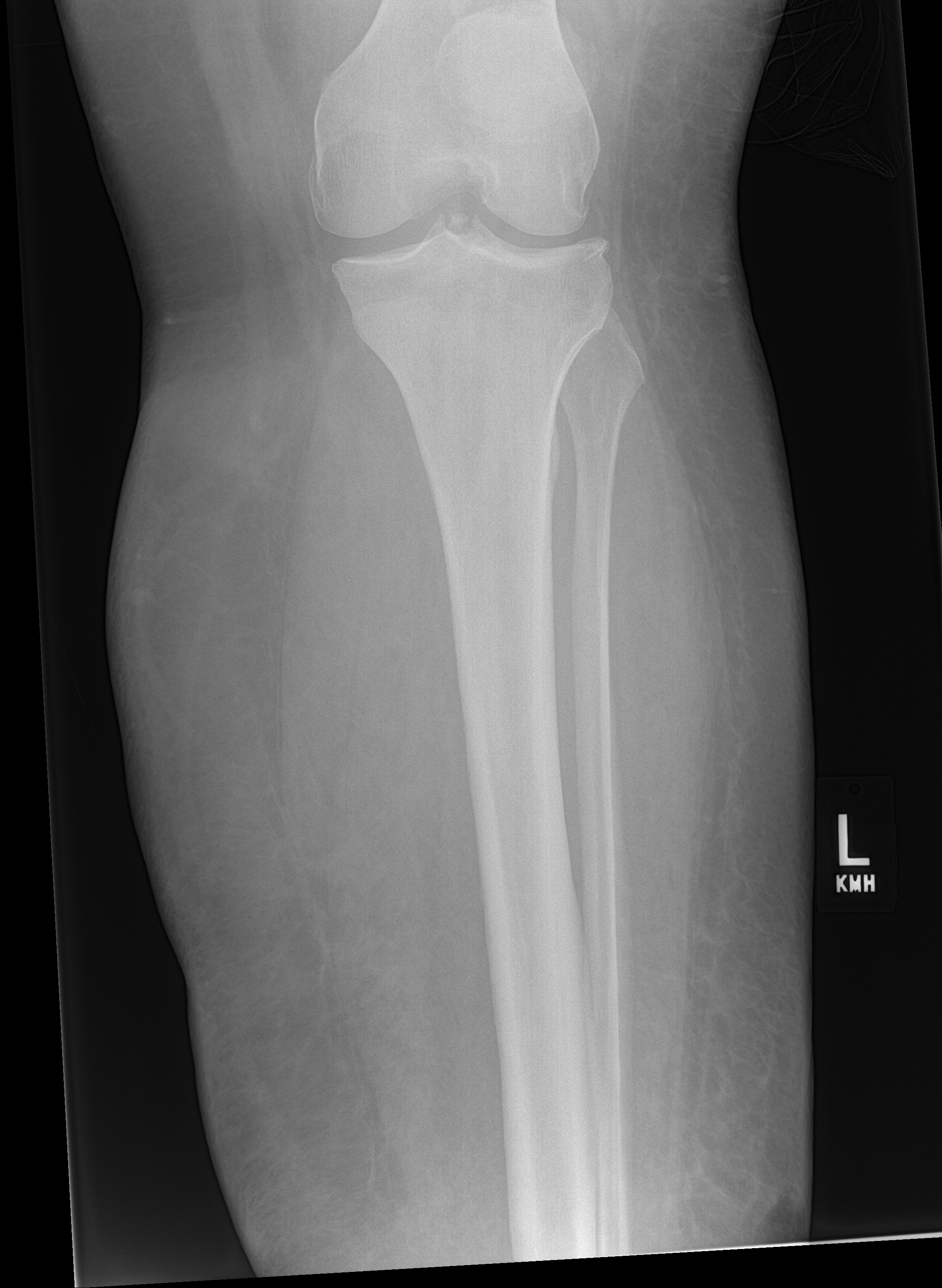

[tibia ap (2 of 2)]
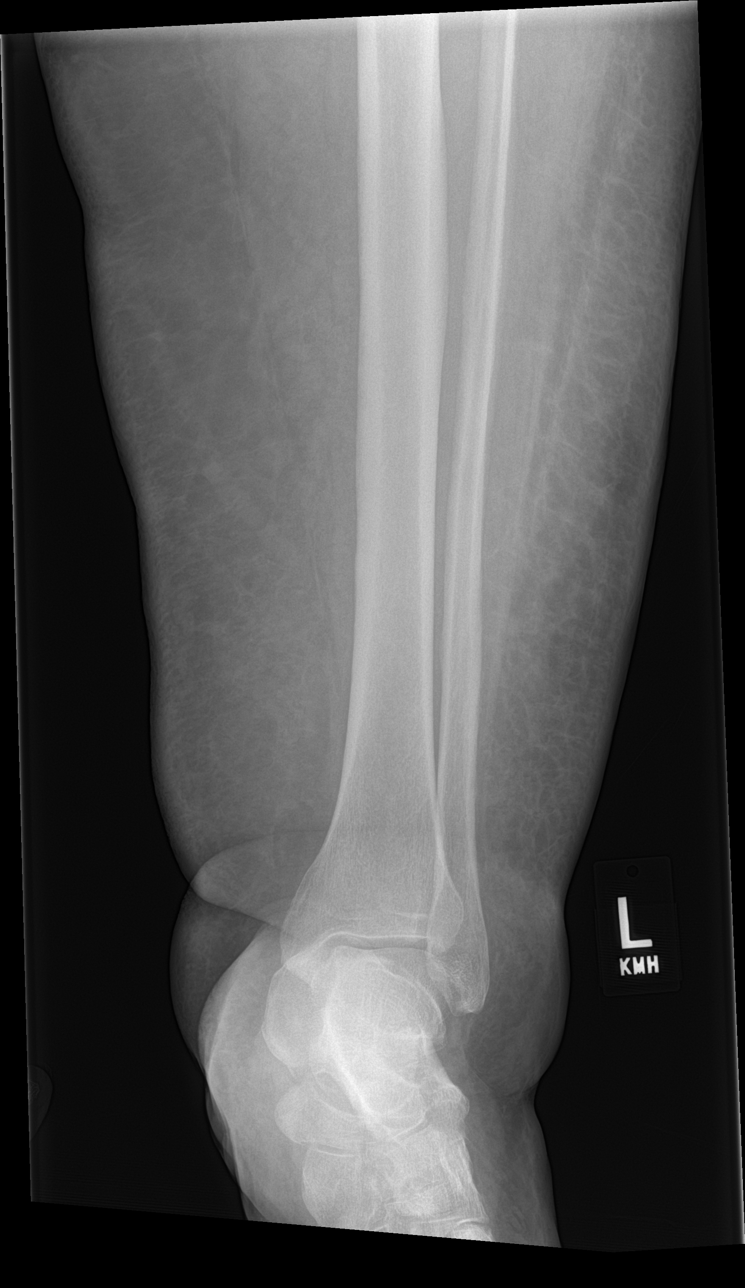

[tibia lat (1 of 2)]
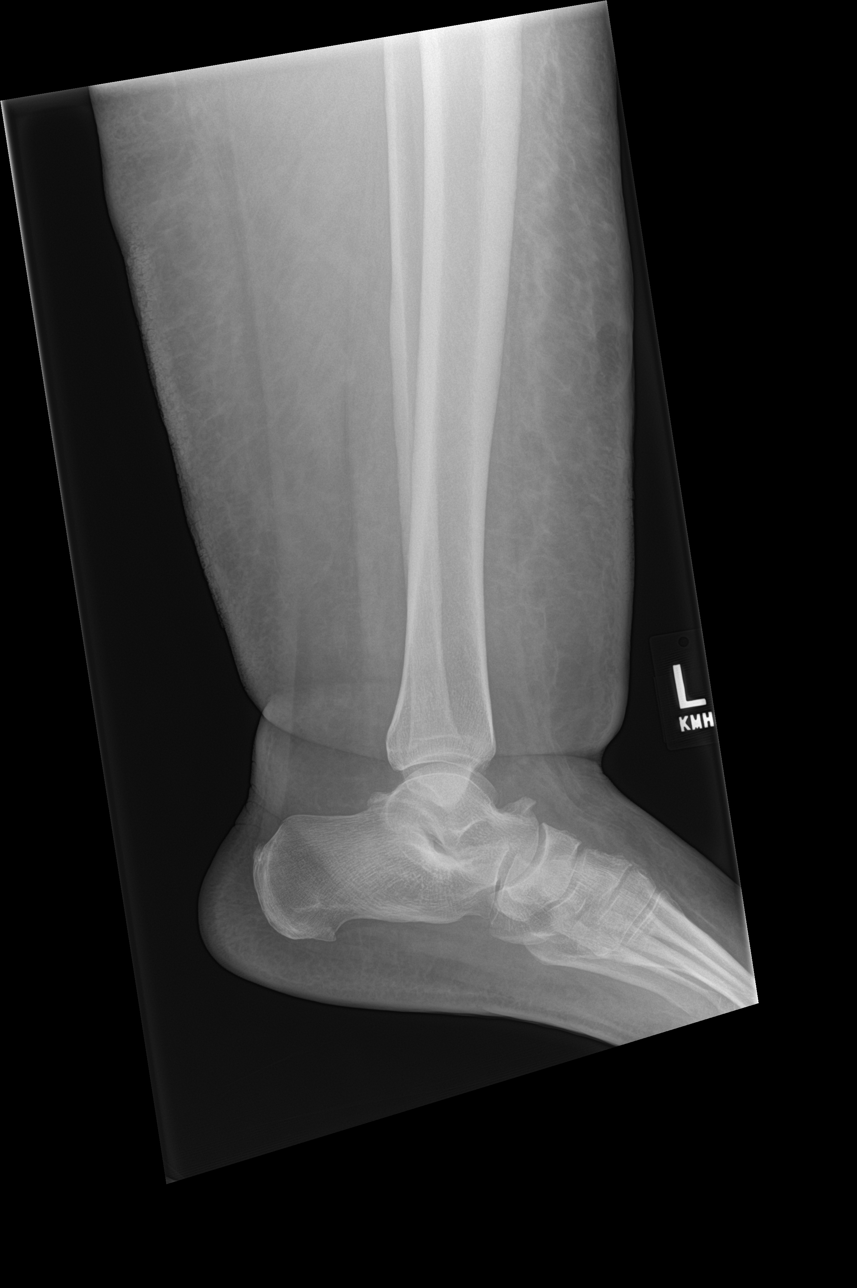

[tibia lat (2 of 2)]
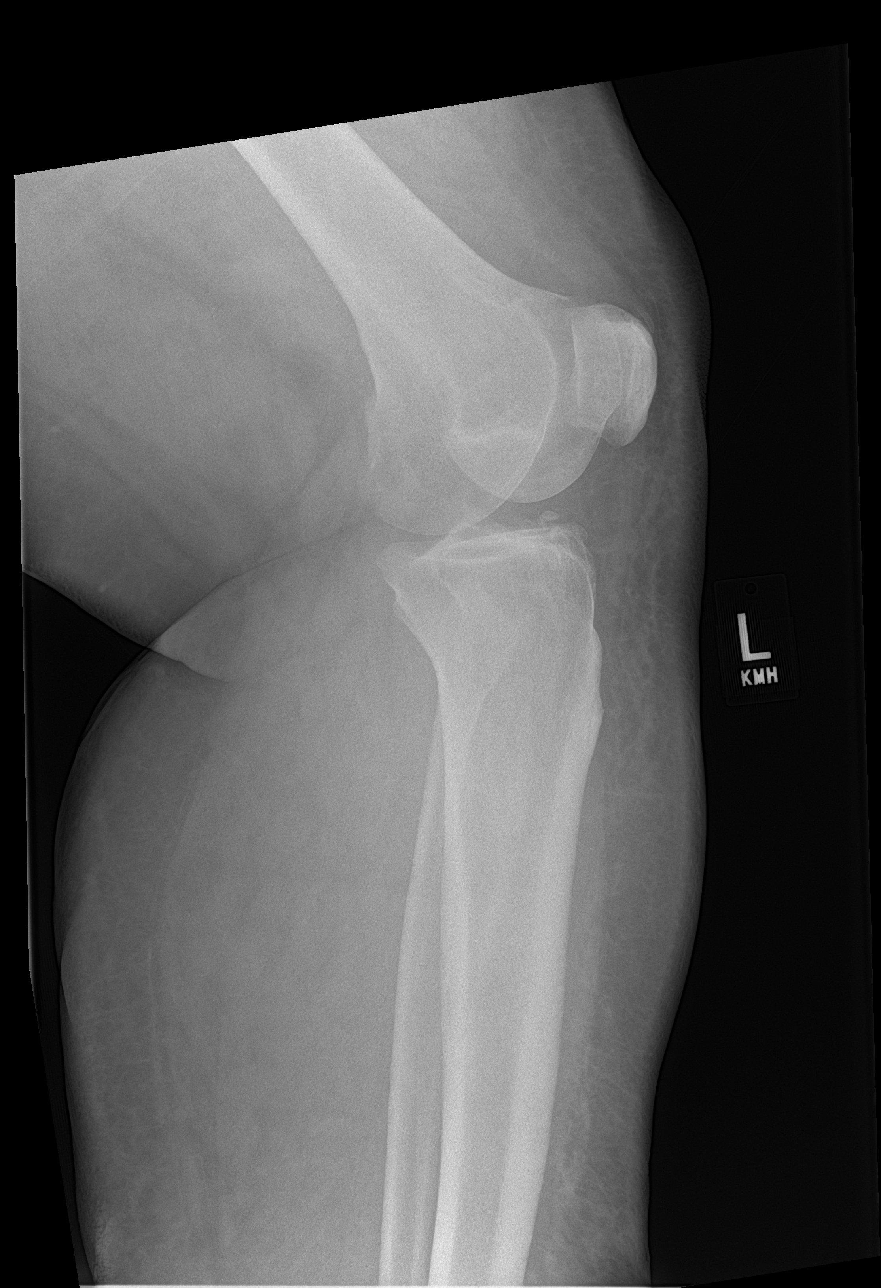

[4 of 4 positions shown; findings below may reference images not displayed]

FINDINGS: Diffuse soft tissue swelling noted without gas or radiopaque foreign
body.

No acute fracture, subluxation, dislocation or osteomyelitis noted.

No focal bony lesions are present.
IMPRESSION: Diffuse soft tissue swelling without acute bony abnormality.

## 2018-12-09 IMAGING — DX DG TIBIA/FIBULA 2V*R*
4 series · 4 of 4 positions shown · non-contrast
Comparison: None.

CLINICAL DATA: RIGHT LOWER extremity cellulitis. Initial encounter.

EXAM:
RIGHT TIBIA AND FIBULA - 2 VIEW

[tibia ap (1 of 2)]
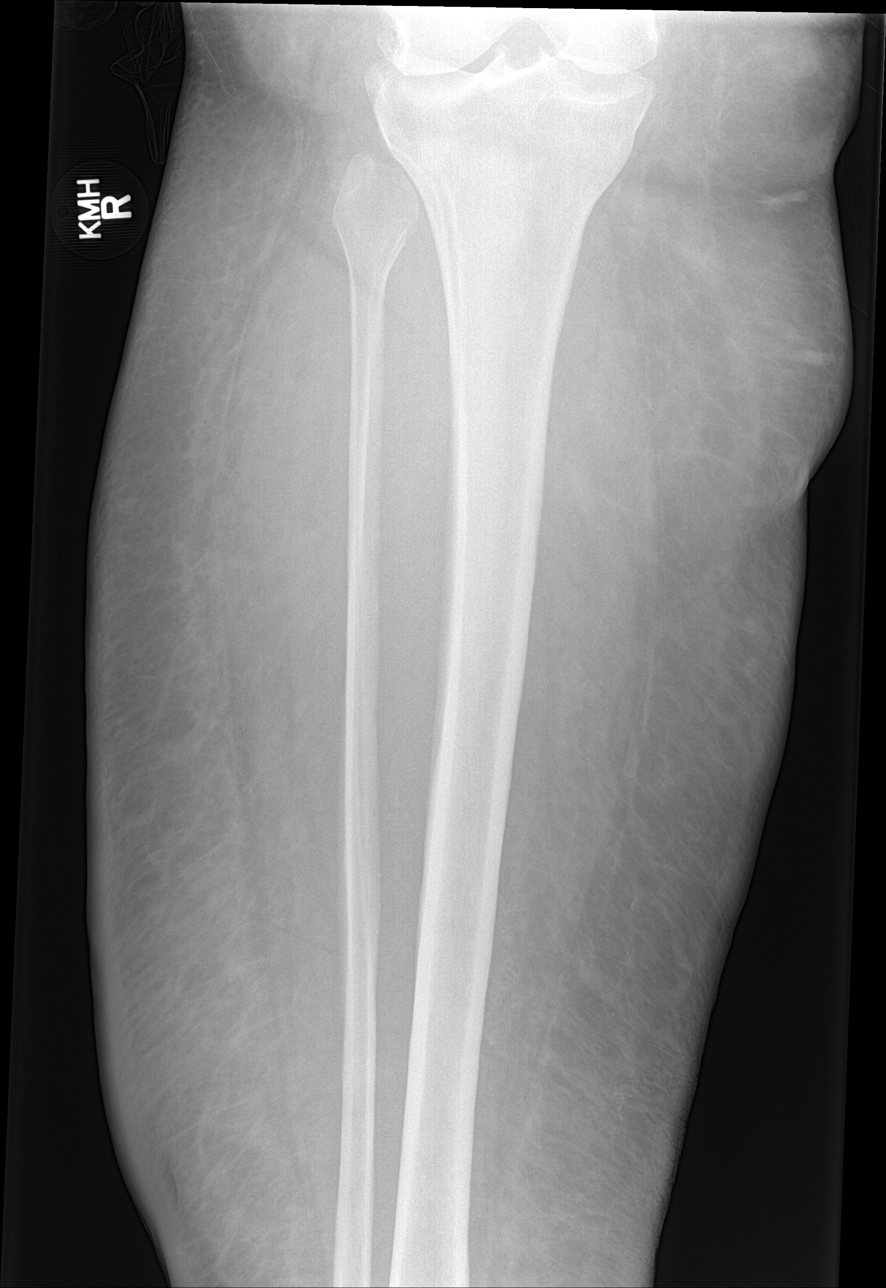

[tibia ap (2 of 2)]
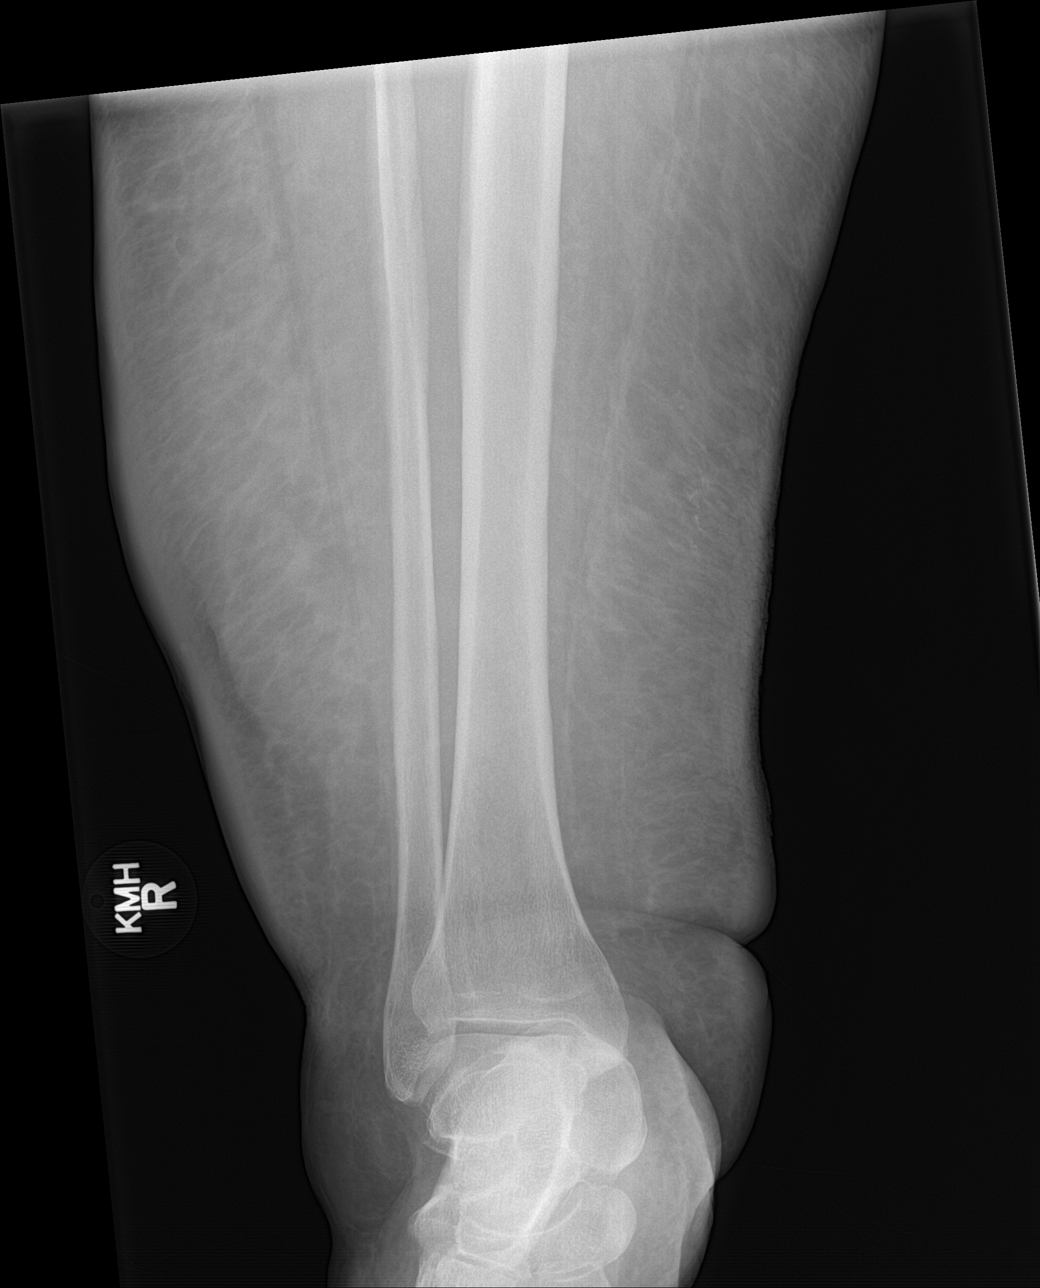

[tibia lat (1 of 2)]
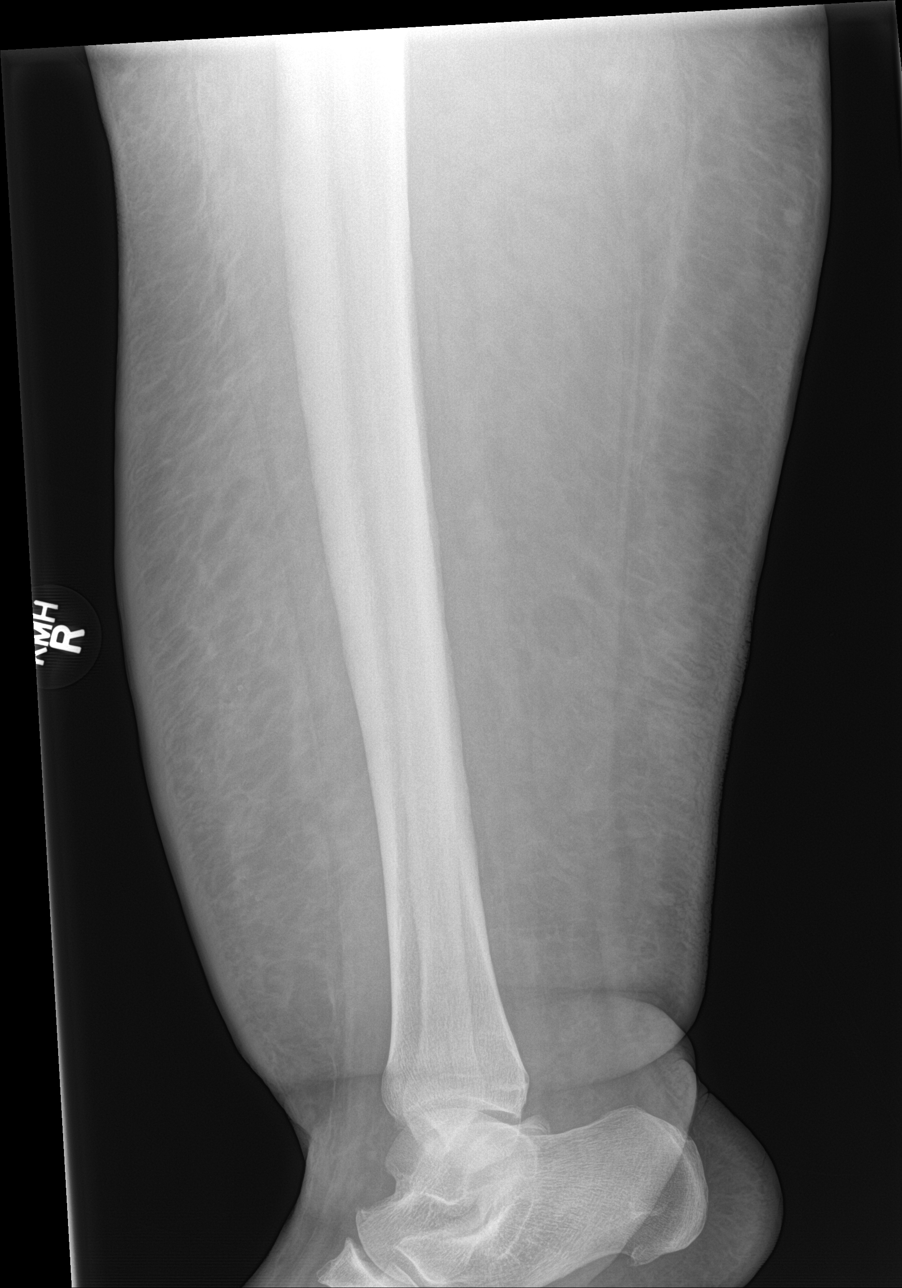

[tibia lat (2 of 2)]
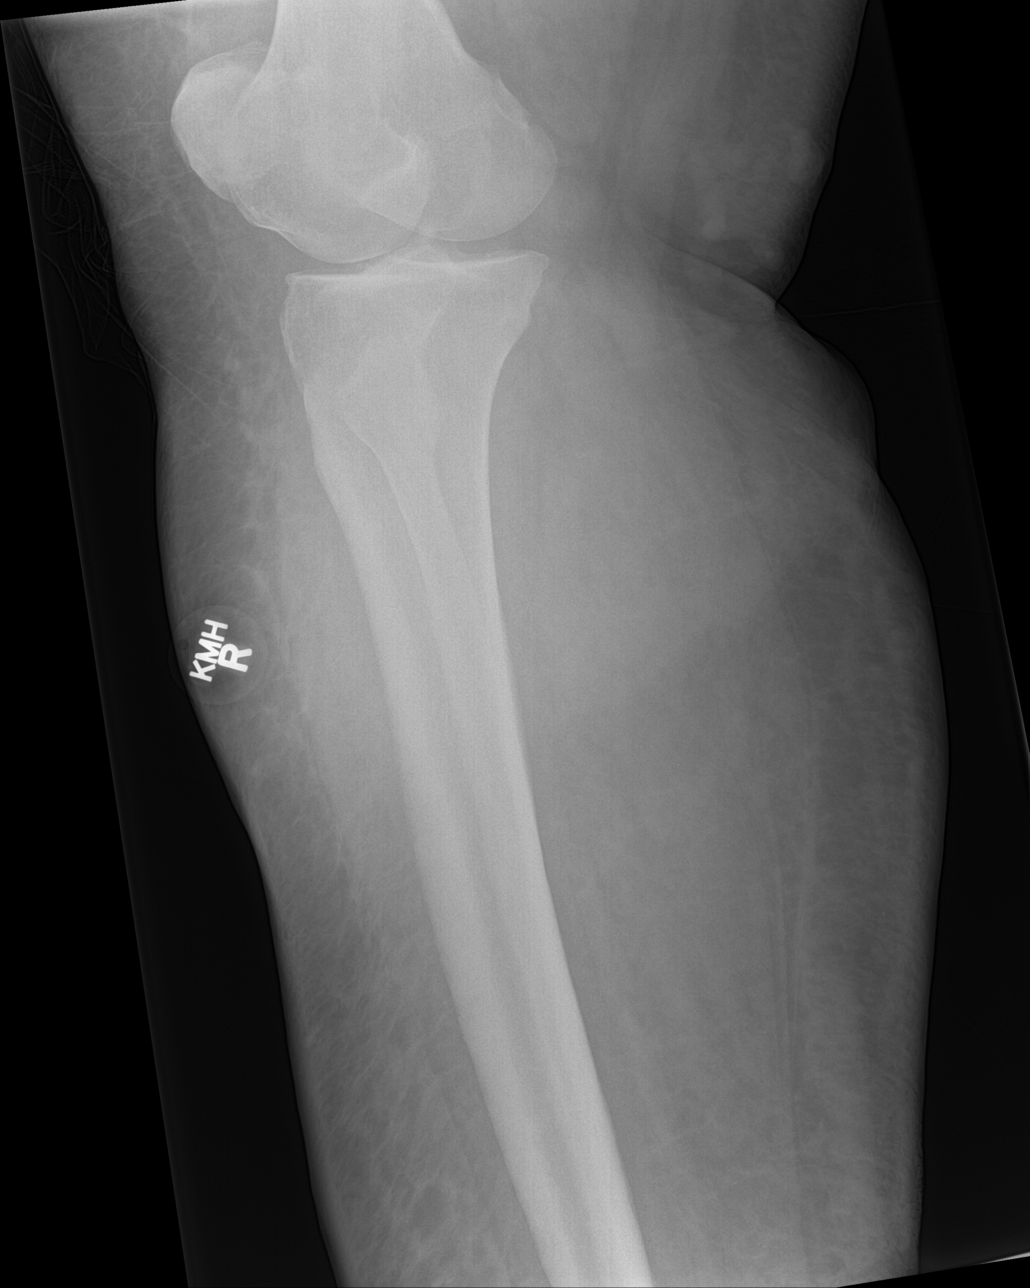

[4 of 4 positions shown; findings below may reference images not displayed]

FINDINGS: Diffuse soft tissue swelling noted without gas or radiopaque foreign
body.

No acute fracture, subluxation, dislocation or osteomyelitis
identified.

No focal bony lesions are present.
IMPRESSION: Soft tissue swelling without acute bony abnormality.

## 2020-03-11 ENCOUNTER — Other Ambulatory Visit: Payer: Self-pay

## 2020-03-11 ENCOUNTER — Ambulatory Visit (HOSPITAL_COMMUNITY)
Admission: RE | Admit: 2020-03-11 | Discharge: 2020-03-11 | Disposition: A | Discharge status: Home / Self Care | Payer: Self-pay | Source: Ambulatory Visit | Attending: Family Medicine | Admitting: Family Medicine

## 2020-03-11 ENCOUNTER — Ambulatory Visit (HOSPITAL_COMMUNITY)
Admission: EM | Admit: 2020-03-11 | Discharge: 2020-03-11 | Disposition: A | Discharge status: Home / Self Care | Payer: Self-pay

## 2020-03-11 ENCOUNTER — Encounter (HOSPITAL_COMMUNITY): Payer: Self-pay

## 2020-03-11 DIAGNOSIS — M79609 Pain in unspecified limb: Secondary | ICD-10-CM

## 2020-03-11 DIAGNOSIS — M79605 Pain in left leg: Secondary | ICD-10-CM

## 2020-03-11 DIAGNOSIS — R200 Anesthesia of skin: Secondary | ICD-10-CM | POA: Insufficient documentation

## 2020-03-11 DIAGNOSIS — R202 Paresthesia of skin: Secondary | ICD-10-CM

## 2020-03-11 LAB — GLUCOSE, CAPILLARY: Glucose-Capillary: 89 mg/dL (ref 70–99)

## 2020-03-11 LAB — CBG MONITORING, ED: Glucose-Capillary: 89 mg/dL (ref 70–99)

## 2020-03-11 MED ORDER — IBUPROFEN 200 MG PO TABS: 600.0000 mg | ORAL_TABLET | Freq: Three times a day (TID) | ORAL | 0 refills | Status: DC | PRN

## 2020-03-11 NOTE — Progress Notes (Addendum)
Lower venous duplex       has been completed. Preliminary results can be found under CV proc through chart review. Jeb Levering, BS, RDMS, RVT  Called results to [facility]

## 2020-03-11 NOTE — ED Triage Notes (Signed)
Pt presents to UC with numbness in her left leg and left feet. Not know trauma. Pt took Tylenol without relief.

## 2020-03-11 NOTE — ED Provider Notes (Signed)
[facility]    CSN: [id number] Arrival date & time: 03/11/20  1108      History   Chief Complaint Chief Complaint  Patient presents with  . Leg Problem    HPI [name] is a 39 y.o. female.   Patient is a 39 year old female with past medical history of hypertension, lymphedema, morbid obesity.  She presents today with pain, numbness and tingling and left leg, back and foot.  This has been constant, waxing waning over the past few days.  The pain is specifically in the left lower back and some that radiates into the left posterior knee area.  Numbness and tingling in left foot at times.  Denies any falls or injuries.  Denies any swelling, erythema to the leg.  Denies any specific calf pain or swelling.  Took Tylenol without much relief.   ROS per HPI      Past Medical History:  Diagnosis Date  . Hypertension    "just today" (08/30/2017)  . Lymphedema of both lower extremities   . Morbid obesity [facility])     Patient Active Problem List   Diagnosis Date Noted  . HTN (hypertension) 04/11/2018  . Lymphedema of lower extremity 04/11/2018  . Morbid obesity with BMI of 50.0-59.9, adult (HCC) 04/11/2018  . Cellulitis 04/11/2018  . Multiple open wounds of left lower extremity 09/01/2017  . Papular rash, localized 09/01/2017  . Sepsis (HCC) 09/01/2015  . Lymphedema 09/01/2015  . Cellulitis of right lower extremity 09/01/2015    Past Surgical History:  Procedure Laterality Date  . NO PAST SURGERIES      OB History    Gravida  0   Para      Term      Preterm      AB      Living        SAB      TAB      Ectopic      Multiple      Live Births               Home Medications    Prior to Admission medications   Medication Sig Start Date End Date Taking? Authorizing Provider  acetaminophen (TYLENOL) 500 MG tablet Take 500 mg by mouth every 6 (six) hours as needed.   Yes [provider]  ibuprofen (ADVIL) 200 MG tablet Take 3  tablets (600 mg total) by mouth every 8 (eight) hours as needed for moderate pain. 03/11/20   Janace Aris, NP    Family History Family History  Problem Relation Age of Onset  . Renal Disease Mother     Social History Social History   Tobacco Use  . Smoking status: Never Smoker  . Smokeless tobacco: Never Used  Substance Use Topics  . Alcohol use: No  . Drug use: No     Allergies   Bactrim [sulfamethoxazole-trimethoprim] and Sulfa antibiotics   Review of Systems Review of Systems   Physical Exam Triage Vital Signs ED Triage Vitals  Enc Vitals Group     BP 03/11/20 1138 (!) 137/101     Pulse Rate 03/11/20 1138 80     Resp 03/11/20 1138 (!) 21     Temp 03/11/20 1138 98.1 F (36.7 C)     Temp Source 03/11/20 1138 Oral     SpO2 03/11/20 1138 98 %     Weight --      Height --      Head Circumference --  Peak Flow --      Pain Score 03/11/20 1137 9     Pain Loc --      Pain Edu? --      Excl. in GC? --    No data found.  Updated Vital Signs BP (!) 137/101 (BP Location: Right Wrist)   Pulse 80   Temp 98.1 F (36.7 C) (Oral)   Resp (!) 21   SpO2 98%   Visual Acuity Right Eye Distance:   Left Eye Distance:   Bilateral Distance:    Right Eye Near:   Left Eye Near:    Bilateral Near:     Physical Exam Musculoskeletal:       Legs:     Comments: Areas highlighted is where patient reports pain is felt when it comes No specific calf tenderness, swelling.  No swelling noted to the leg comparable to the right leg.  No temperature or color change.  1+ pedal pulse without any temperature or color changes to foot or toes. Good range of motion No open wounds or drainage      UC Treatments / Results  Labs (all labs ordered are listed, but only abnormal results are displayed) Labs Reviewed  GLUCOSE, CAPILLARY  CBG MONITORING, ED    EKG   Radiology LE VENOUS  Result Date: 03/11/2020  Lower Venous DVTStudy Other Indications: Patient presents  with numbness from hip down leg posteriorly                    to foot. Limitations: Body habitus. Performing Technologist: Jeb Levering RDMS, RVT  Examination Guidelines: A complete evaluation includes B-mode imaging, spectral Doppler, color Doppler, and power Doppler as needed of all accessible portions of each vessel. Bilateral testing is considered an integral part of a complete examination. Limited examinations for reoccurring indications may be performed as noted. The reflux portion of the exam is performed with the patient in reverse Trendelenburg.  +-----+---------------+---------+-----------+----------+--------------+ RIGHTCompressibilityPhasicitySpontaneityProperties               +-----+---------------+---------+-----------+----------+--------------+ CFV                                               Not visualized +-----+---------------+---------+-----------+----------+--------------+   +---------+---------------+---------+-----------+----------+--------------+ LEFT     CompressibilityPhasicitySpontaneityPropertiesThrombus Aging +---------+---------------+---------+-----------+----------+--------------+ CFV      Full           Yes      Yes                                 +---------+---------------+---------+-----------+----------+--------------+ SFJ      Full                                                        +---------+---------------+---------+-----------+----------+--------------+ FV Prox  Full                                                        +---------+---------------+---------+-----------+----------+--------------+ FV Mid   Full                                                        +---------+---------------+---------+-----------+----------+--------------+  FV DistalFull                                                        +---------+---------------+---------+-----------+----------+--------------+ PFV      Full                                                         +---------+---------------+---------+-----------+----------+--------------+ POP      Full           Yes      Yes                                 +---------+---------------+---------+-----------+----------+--------------+ PTV      Full                                                        +---------+---------------+---------+-----------+----------+--------------+ PERO     Full                                                        +---------+---------------+---------+-----------+----------+--------------+   Left Technical Findings: not all segments well visualized due to body habitus   Summary: LEFT: - There is no evidence of deep vein thrombosis in the lower extremity. However, portions of this examination were limited- see technologist comments above.  *See table(s) above for measurements and observations.    Preliminary     Procedures Procedures (including critical care time)  Medications Ordered in UC Medications - No data to display  Initial Impression / Assessment and Plan / UC Course  I have reviewed the triage vital signs and the nursing notes.  Pertinent labs & imaging results that were available during my care of the patient were reviewed by me and considered in my medical decision making (see chart for details).     Leg pain Differentials include DVT, sciatic nerve, neuropathy Most likely sciatic nerve.  Exam pretty much benign. CBG normal here symptoms of diabetes or complication of neuropathy. No concern for infection even with patient's history of cellulitis. Vital signs stable and she is nontoxic or ill-appearing DVT study negative We will go ahead with plan to treat for sciatic nerve pain with ibuprofen 600 mg every 8 hours Rest and follow-up for any continued or worsening problems Final Clinical Impressions(s) / UC Diagnoses   Final diagnoses:  Pain of left lower extremity     Discharge Instructions     Your  blood sugar is completely normal. We are sending you for a ultrasound of the leg to make sure there is no blood clotting. At the study is negative for blood clotting you can try doing ibuprofen every 8 hours for pain.  If symptoms worsen you need to be reevaluated.  Go for an ultrasound of your leg at 230  at [facility] entrance C at the vascular lab     ED Prescriptions    Medication Sig Dispense Auth. Provider   ibuprofen (ADVIL) 200 MG tablet Take 3 tablets (600 mg total) by mouth every 8 (eight) hours as needed for moderate pain. 30 tablet Dahlia Byes A, NP     PDMP not reviewed this encounter.   Janace Aris, NP 03/11/20 1535

## 2020-03-11 NOTE — Discharge Instructions (Addendum)
Your blood sugar is completely normal. We are sending you for a ultrasound of the leg to make sure there is no blood clotting. At the study is negative for blood clotting you can try doing ibuprofen every 8 hours for pain.  If symptoms worsen you need to be reevaluated.  Go for an ultrasound of your leg at 230 at [facility] entrance C at the vascular lab

## 2020-09-14 ENCOUNTER — Emergency Department (HOSPITAL_COMMUNITY)
Admission: EM | Admit: 2020-09-14 | Discharge: 2020-09-14 | Disposition: A | Discharge status: Home / Self Care | Payer: Self-pay | Attending: Emergency Medicine | Admitting: Emergency Medicine

## 2020-09-14 ENCOUNTER — Emergency Department (HOSPITAL_COMMUNITY): Payer: Self-pay

## 2020-09-14 ENCOUNTER — Encounter (HOSPITAL_COMMUNITY): Payer: Self-pay

## 2020-09-14 ENCOUNTER — Other Ambulatory Visit: Payer: Self-pay

## 2020-09-14 DIAGNOSIS — I10 Essential (primary) hypertension: Secondary | ICD-10-CM | POA: Insufficient documentation

## 2020-09-14 DIAGNOSIS — R0602 Shortness of breath: Secondary | ICD-10-CM | POA: Insufficient documentation

## 2020-09-14 DIAGNOSIS — R0789 Other chest pain: Secondary | ICD-10-CM | POA: Insufficient documentation

## 2020-09-14 LAB — HEPATIC FUNCTION PANEL
ALT: 15 U/L (ref 0–44)
AST: 18 U/L (ref 15–41)
Albumin: 3.9 g/dL (ref 3.5–5.0)
Alkaline Phosphatase: 59 U/L (ref 38–126)
Bilirubin, Direct: 0.1 mg/dL (ref 0.0–0.2)
Indirect Bilirubin: 0.7 mg/dL (ref 0.3–0.9)
Total Bilirubin: 0.8 mg/dL (ref 0.3–1.2)
Total Protein: 7.5 g/dL (ref 6.5–8.1)

## 2020-09-14 LAB — BASIC METABOLIC PANEL
Anion gap: 11 (ref 5–15)
BUN: 11 mg/dL (ref 6–20)
CO2: 25 mmol/L (ref 22–32)
Calcium: 8.9 mg/dL (ref 8.9–10.3)
Chloride: 103 mmol/L (ref 98–111)
Creatinine, Ser: 0.62 mg/dL (ref 0.44–1.00)
GFR, Estimated: >60 mL/min (ref >60)
Glucose, Bld: 111 mg/dL — ABNORMAL HIGH (ref 70–99)
Potassium: 3.7 mmol/L (ref 3.5–5.1)
Sodium: 139 mmol/L (ref 135–145)

## 2020-09-14 LAB — CBC
HCT: 43.4 % (ref 36.0–46.0)
Hemoglobin: 14.7 g/dL (ref 12.0–15.0)
MCH: 33.1 pg (ref 26.0–34.0)
MCHC: 33.9 g/dL (ref 30.0–36.0)
MCV: 97.7 fL (ref 80.0–100.0)
Platelets: 292 10*3/uL (ref 150–400)
RBC: 4.44 MIL/uL (ref 3.87–5.11)
RDW: 12.5 % (ref 11.5–15.5)
WBC: 9.5 10*3/uL (ref 4.0–10.5)
nRBC: 0 % (ref 0.0–0.2)

## 2020-09-14 LAB — I-STAT BETA HCG BLOOD, ED (MC, WL, AP ONLY): I-stat hCG, quantitative: <5 m[IU]/mL (ref <5)

## 2020-09-14 LAB — TROPONIN I (HIGH SENSITIVITY)
Troponin I (High Sensitivity): 2 ng/L (ref <18)
Troponin I (High Sensitivity): 2 ng/L (ref <18)

## 2020-09-14 LAB — D-DIMER, QUANTITATIVE: D-Dimer, Quant: 0.28 ug/mL-FEU (ref 0.00–0.50)

## 2020-09-14 NOTE — ED Notes (Signed)
Pt discharged from this ED in stable condition at this time. All discharge instructions and follow up care reviewed with pt with no further questions at this time. Pt ambulatory with steady gait, clear speech.  

## 2020-09-14 NOTE — Discharge Instructions (Addendum)
Follow-up with your doctors for the chest pain.

## 2020-09-14 NOTE — ED Triage Notes (Signed)
Patient c/o right chest pain that radiates into the back and SOB since 0715 today.

## 2020-09-14 NOTE — ED Provider Notes (Signed)
[facility]PT Provider Note   CSN: [id number] Arrival date & time: 09/14/20  6720     History Chief Complaint  Patient presents with  . Chest Pain  . Shortness of Breath    [name] is a 39 y.o. female.  HPI Patient presents with chest pain and shortness of breath.  Began around 7 this morning.  It is in the right upper chest.  States worse with breathing.  Worse with certain movements.  States she does feel short of breath.  Has not had pains like this before.  No fevers.  No cough.  Does have chronic lymphedema in both of her legs.  Pain is sharp.  No hemoptysis.  No history of DVT.  Patient is morbidly obese.    Past Medical History:  Diagnosis Date  . Hypertension    "just today" (08/30/2017)  . Lymphedema of both lower extremities   . Morbid obesity [facility])     Patient Active Problem List   Diagnosis Date Noted  . HTN (hypertension) 04/11/2018  . Lymphedema of lower extremity 04/11/2018  . Morbid obesity with BMI of 50.0-59.9, adult (HCC) 04/11/2018  . Cellulitis 04/11/2018  . Multiple open wounds of left lower extremity 09/01/2017  . Papular rash, localized 09/01/2017  . Sepsis (HCC) 09/01/2015  . Lymphedema 09/01/2015  . Cellulitis of right lower extremity 09/01/2015    Past Surgical History:  Procedure Laterality Date  . NO PAST SURGERIES       OB History    Gravida  0   Para      Term      Preterm      AB      Living        SAB      TAB      Ectopic      Multiple      Live Births              Family History  Problem Relation Age of Onset  . Renal Disease Mother     Social History   Tobacco Use  . Smoking status: Never Smoker  . Smokeless tobacco: Never Used  Vaping Use  . Vaping Use: Never used  Substance Use Topics  . Alcohol use: No  . Drug use: No    Home Medications Prior to Admission medications   Medication Sig Start Date End Date Taking? Authorizing Provider    acetaminophen (TYLENOL) 500 MG tablet Take 500 mg by mouth every 6 (six) hours as needed.   Yes [provider]  PRESCRIPTION MEDICATION Take 1 tablet by mouth daily. Birth control   Yes [provider]  ibuprofen (ADVIL) 200 MG tablet Take 3 tablets (600 mg total) by mouth every 8 (eight) hours as needed for moderate pain. Patient not taking: Reported on 09/14/2020 03/11/20   Dahlia Byes A, NP    Allergies    Bactrim [sulfamethoxazole-trimethoprim] and Sulfa antibiotics  Review of Systems   Review of Systems  Constitutional: Negative for appetite change.  HENT: Negative for congestion.   Respiratory: Positive for shortness of breath.   Cardiovascular: Positive for chest pain and leg swelling.  Gastrointestinal: Negative for abdominal pain.  Endocrine: Negative for polyuria.  Genitourinary: Negative for flank pain.  Musculoskeletal: Negative for back pain.  Skin: Negative for wound.  Neurological: Negative for weakness.  Psychiatric/Behavioral: Negative for confusion.    Physical Exam Updated Vital Signs BP (!) 150/102   Pulse 77  Temp 97.9 F (36.6 C) (Oral)   Resp 20   Ht 6\' 1"  (1.854 m)   Wt (!) 203.2 kg   SpO2 95%   BMI 59.11 kg/m   Physical Exam Vitals and nursing note reviewed.  Constitutional:      Appearance: She is obese.  HENT:     Head: Normocephalic.  Cardiovascular:     Rate and Rhythm: Regular rhythm. Tachycardia present.  Pulmonary:     Breath sounds: No wheezing, rhonchi or rales.  Chest:     Comments: Tenderness to bilateral upper chest. Abdominal:     Tenderness: There is no abdominal tenderness.     Comments: No epigastric or right upper quadrant tenderness.  Musculoskeletal:     Cervical back: Neck supple.     Right lower leg: Edema present.     Left lower leg: Edema present.     Comments: Lymphedema bilateral lower extremities.  Skin:    General: Skin is warm.     Capillary Refill: Capillary refill takes less than 2  seconds.  Neurological:     Mental Status: She is oriented to person, place, and time.  Psychiatric:        Behavior: Behavior normal.     ED Results / Procedures / Treatments   Labs (all labs ordered are listed, but only abnormal results are displayed) Labs Reviewed  BASIC METABOLIC PANEL - Abnormal; Notable for the following components:      Result Value   Glucose, Bld 111 (*)    All other components within normal limits  CBC  HEPATIC FUNCTION PANEL  D-DIMER, QUANTITATIVE (NOT AT [facility])  I-STAT BETA HCG BLOOD, ED ([facility], [facility], [facility] ONLY)  TROPONIN I (HIGH SENSITIVITY)  TROPONIN I (HIGH SENSITIVITY)    EKG EKG Interpretation  Date/Time:  Tuesday September 14 2020 12:06:07 EDT Ventricular Rate:  77 PR Interval:    QRS Duration: 99 QT Interval:  432 QTC Calculation: 489 R Axis:   -11 Text Interpretation: Sinus rhythm Borderline short PR interval Abnormal R-wave progression, early transition Inferior infarct, old Confirmed by 04[birth year]24-1992 [phone]) on 09/14/2020 12:38:59 PM   Radiology DG Chest 2 View  Result Date: 09/14/2020 CLINICAL DATA:  Shortness of breath and RIGHT-side chest pain radiating to back EXAM: CHEST - 2 VIEW COMPARISON:  04/11/2018 FINDINGS: Normal heart size, mediastinal contours, and pulmonary vascularity. Lungs clear. No pleural effusion or pneumothorax. Bones unremarkable. IMPRESSION: Normal exam. Electronically Signed   By: 04/13/2018 M.D.   On: 09/14/2020 10:02    Procedures Procedures (including critical care time)  Medications Ordered in ED Medications - No data to display  ED Course  I have reviewed the triage vital signs and the nursing notes.  Pertinent labs & imaging results that were available during my care of the patient were reviewed by me and considered in my medical decision making (see chart for details).    MDM Rules/Calculators/A&P                          Patient presents with right-sided chest pain.  Some shortness of breath.   Initial tachycardia.  Not hypoxic.  However moderate risk for pulmonary embolism due to body habitus.  D-dimer negative fortunately.  EKG reassuring.  Troponin negative.  Chest x-ray reassuring.  I think patient stable for discharge.  Doubt cardiac cause.  Differential diagnosis includes pulmonary embolism, pneumothorax, pneumonia, chest wall pain. I reviewed imaging and lab work on patient.  EKG interpreted by me. Final Clinical Impression(s) / ED Diagnoses Final diagnoses:  Atypical chest pain    Rx / DC Orders ED Discharge Orders    None       Benjiman Core, MD 09/14/20 1240

## 2021-06-09 ENCOUNTER — Other Ambulatory Visit: Payer: Self-pay

## 2021-06-09 ENCOUNTER — Encounter (HOSPITAL_COMMUNITY): Payer: Self-pay | Admitting: Emergency Medicine

## 2021-06-09 ENCOUNTER — Emergency Department (HOSPITAL_COMMUNITY)
Admission: EM | Admit: 2021-06-09 | Discharge: 2021-06-09 | Disposition: A | Discharge status: Home / Self Care | Payer: Self-pay | Attending: Emergency Medicine | Admitting: Emergency Medicine

## 2021-06-09 DIAGNOSIS — I10 Essential (primary) hypertension: Secondary | ICD-10-CM | POA: Insufficient documentation

## 2021-06-09 DIAGNOSIS — U071 COVID-19: Secondary | ICD-10-CM | POA: Insufficient documentation

## 2021-06-09 DIAGNOSIS — Z20822 Contact with and (suspected) exposure to covid-19: Secondary | ICD-10-CM

## 2021-06-09 NOTE — Discharge Instructions (Addendum)
Please check the results of your COVID-19 test on MyChart.  They should be available tomorrow.  If you find that your COVID-19 test is positive please make sure that you quarantine based on the current CDC guidelines.  Please return to the emergency department with any new or worsening symptoms.  It was a pleasure to meet you.

## 2021-06-09 NOTE — ED Triage Notes (Signed)
Patient c/o cough and Covid exposure x 2 days. Requesting Covid test.

## 2021-06-09 NOTE — ED Provider Notes (Signed)
[facility] Provider Note   CSN: [id number] Arrival date & time: 06/09/21  2014     History Chief Complaint  Patient presents with   Covid Exposure   Cough    [name] is a 40 y.o. female.  HPI Patient is a 40 year old female with a medical history as noted below.  She presents to the emergency department due to a COVID-19 exposure.  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  She states that she has been experiencing rhinorrhea, sneezing, as well as a productive cough with yellow sputum.  Denies any chest pain or shortness of breath.  No other complaints.  She has been vaccinated for COVID-19 x2.  No known history of previous COVID-19 infection.    Past Medical History:  Diagnosis Date   Hypertension    "just today" (08/30/2017)   Lymphedema of both lower extremities    Morbid obesity [facility])     Patient Active Problem List   Diagnosis Date Noted   HTN (hypertension) 04/11/2018   Lymphedema of lower extremity 04/11/2018   Morbid obesity with BMI of 50.0-59.9, adult (HCC) 04/11/2018   Cellulitis 04/11/2018   Multiple open wounds of left lower extremity 09/01/2017   Papular rash, localized 09/01/2017   Sepsis (HCC) 09/01/2015   Lymphedema 09/01/2015   Cellulitis of right lower extremity 09/01/2015    Past Surgical History:  Procedure Laterality Date   NO PAST SURGERIES       OB History     Gravida  0   Para      Term      Preterm      AB      Living         SAB      IAB      Ectopic      Multiple      Live Births              Family History  Problem Relation Age of Onset   Renal Disease Mother     Social History   Tobacco Use   Smoking status: Never   Smokeless tobacco: Never  Vaping Use   Vaping Use: Never used  Substance Use Topics   Alcohol use: No   Drug use: No    Home  Medications Prior to Admission medications   Medication Sig Start Date End Date Taking? Authorizing Provider  acetaminophen (TYLENOL) 500 MG tablet Take 500 mg by mouth every 6 (six) hours as needed.    [provider]  ibuprofen (ADVIL) 200 MG tablet Take 3 tablets (600 mg total) by mouth every 8 (eight) hours as needed for moderate pain. Patient not taking: Reported on 09/14/2020 03/11/20   Janace Aris, NP  PRESCRIPTION MEDICATION Take 1 tablet by mouth daily. Birth control    [provider]    Allergies    Bactrim [sulfamethoxazole-trimethoprim] and Sulfa antibiotics  Review of Systems   Review of Systems  HENT:  Positive for congestion, rhinorrhea and sneezing.   Respiratory:  Positive for cough. Negative for shortness of breath.   Cardiovascular:  Negative for chest pain.  Gastrointestinal:  Negative for vomiting.   Physical Exam Updated Vital Signs BP (!) 146/103 (BP Location: Left Arm)   Pulse 98   Temp 98.5 F (36.9 C) (Oral)   Resp 20  Ht 6\' 1"  (1.854 m)   Wt (!) 190.5 kg   LMP 05/26/2021   SpO2 97%   BMI 55.41 kg/m   Physical Exam Vitals and nursing note reviewed.  Constitutional:      General: She is not in acute distress.    Appearance: Normal appearance. She is not ill-appearing, toxic-appearing or diaphoretic.  HENT:     Head: Normocephalic and atraumatic.     Right Ear: External ear normal.     Left Ear: External ear normal.     Nose: Nose normal.     Mouth/Throat:     Mouth: Mucous membranes are moist.     Pharynx: Oropharynx is clear. No oropharyngeal exudate or posterior oropharyngeal erythema.  Eyes:     Extraocular Movements: Extraocular movements intact.  Cardiovascular:     Rate and Rhythm: Normal rate and regular rhythm.     Pulses: Normal pulses.     Heart sounds: Normal heart sounds. No murmur heard.   No friction rub. No gallop.     Comments: Regular rate and rhythm without murmurs, rubs, or gallops. Pulmonary:      Effort: Pulmonary effort is normal. No respiratory distress.     Breath sounds: Normal breath sounds. No stridor. No wheezing, rhonchi or rales.     Comments: Lungs are clear to auscultation bilaterally.  No wheezing, rales, or rhonchi. Abdominal:     General: Abdomen is flat.     Tenderness: There is no abdominal tenderness.  Musculoskeletal:        General: Normal range of motion.     Cervical back: Normal range of motion and neck supple. No tenderness.  Skin:    General: Skin is warm and dry.  Neurological:     General: No focal deficit present.     Mental Status: She is alert and oriented to person, place, and time.  Psychiatric:        Mood and Affect: Mood normal.        Behavior: Behavior normal.    ED Results / Procedures / Treatments   Labs (all labs ordered are listed, but only abnormal results are displayed) Labs Reviewed - No data to display  EKG None  Radiology No results found.  Procedures Procedures   Medications Ordered in ED Medications - No data to display  ED Course  I have reviewed the triage vital signs and the nursing notes.  Pertinent labs & imaging results that were available during my care of the patient were reviewed by me and considered in my medical decision making (see chart for details).    MDM Rules/Calculators/A&P                           Patient is a 40 year old female who presents to the emergency department for COVID-19 testing after having a COVID exposure 2 days ago.  She has been experiencing sneezing, rhinorrhea, as well as a productive cough.  No chest pain or shortness of breath.  She has been vaccinated for COVID-19 x2.  No known history of previous COVID-19 infection.  Physical exam is extremely reassuring.  Heart is regular rate and rhythm.  Lungs are clear to auscultation bilaterally.  Patient denies any chest pain or shortness of breath.  Will obtain a 6 to 24-hour COVID-19 test.  Patient is going to check these results  on MyChart tomorrow.  She understands she will need to quarantine based on the current CDC guidelines if  she finds that she is positive.  We discussed return precautions.  Her questions were answered and she was amicable at the time of discharge.  Final Clinical Impression(s) / ED Diagnoses Final diagnoses:  Close exposure to COVID-19 virus    Rx / DC Orders ED Discharge Orders     None        Placido Sou, PA-C 06/09/21 2200    Koleen Distance, MD 06/09/21 [phone]

## 2021-06-10 LAB — SARS CORONAVIRUS 2 (TAT 6-24 HRS): SARS Coronavirus 2: POSITIVE — AB

## 2021-11-15 ENCOUNTER — Emergency Department (HOSPITAL_COMMUNITY)
Admission: EM | Admit: 2021-11-15 | Discharge: 2021-11-15 | Disposition: A | Discharge status: Home / Self Care | Payer: Self-pay | Attending: Emergency Medicine | Admitting: Emergency Medicine

## 2021-11-15 ENCOUNTER — Encounter (HOSPITAL_COMMUNITY): Payer: Self-pay

## 2021-11-15 ENCOUNTER — Other Ambulatory Visit: Payer: Self-pay

## 2021-11-15 DIAGNOSIS — N939 Abnormal uterine and vaginal bleeding, unspecified: Secondary | ICD-10-CM | POA: Insufficient documentation

## 2021-11-15 DIAGNOSIS — Z5321 Procedure and treatment not carried out due to patient leaving prior to being seen by health care provider: Secondary | ICD-10-CM | POA: Insufficient documentation

## 2021-11-15 NOTE — ED Triage Notes (Signed)
Pt arrives POV for eval of heavy vaginal bleeding. Pt reports she is due for her period during this week, but much heavier bleeding than normal. Reports passing clots. Denies hx of heavy periods. Endorses worse than normal cramping as well. Denies chance of pregnancy

## 2021-11-15 NOTE — ED Notes (Signed)
Called patient several times for vital re-check patient didn't answer   

## 2021-12-05 ENCOUNTER — Other Ambulatory Visit: Payer: Self-pay

## 2021-12-05 ENCOUNTER — Encounter (HOSPITAL_COMMUNITY): Payer: Self-pay

## 2021-12-05 ENCOUNTER — Emergency Department (HOSPITAL_COMMUNITY)
Admission: EM | Admit: 2021-12-05 | Discharge: 2021-12-06 | Disposition: A | Discharge status: Home / Self Care | Payer: 59 | Attending: Emergency Medicine | Admitting: Emergency Medicine

## 2021-12-05 DIAGNOSIS — D72829 Elevated white blood cell count, unspecified: Secondary | ICD-10-CM | POA: Insufficient documentation

## 2021-12-05 DIAGNOSIS — N730 Acute parametritis and pelvic cellulitis: Secondary | ICD-10-CM | POA: Diagnosis not present

## 2021-12-05 DIAGNOSIS — N200 Calculus of kidney: Secondary | ICD-10-CM | POA: Insufficient documentation

## 2021-12-05 DIAGNOSIS — I10 Essential (primary) hypertension: Secondary | ICD-10-CM | POA: Diagnosis not present

## 2021-12-05 DIAGNOSIS — N3000 Acute cystitis without hematuria: Secondary | ICD-10-CM | POA: Insufficient documentation

## 2021-12-05 DIAGNOSIS — N9489 Other specified conditions associated with female genital organs and menstrual cycle: Secondary | ICD-10-CM | POA: Insufficient documentation

## 2021-12-05 DIAGNOSIS — R000 Tachycardia, unspecified: Secondary | ICD-10-CM | POA: Insufficient documentation

## 2021-12-05 DIAGNOSIS — A599 Trichomoniasis, unspecified: Secondary | ICD-10-CM | POA: Diagnosis not present

## 2021-12-05 DIAGNOSIS — R1030 Lower abdominal pain, unspecified: Secondary | ICD-10-CM

## 2021-12-05 LAB — COMPREHENSIVE METABOLIC PANEL
ALT: 11 U/L (ref 0–44)
AST: 14 U/L — ABNORMAL LOW (ref 15–41)
Albumin: 3.5 g/dL (ref 3.5–5.0)
Alkaline Phosphatase: 69 U/L (ref 38–126)
Anion gap: 11 (ref 5–15)
BUN: 12 mg/dL (ref 6–20)
CO2: 23 mmol/L (ref 22–32)
Calcium: 9.3 mg/dL (ref 8.9–10.3)
Chloride: 103 mmol/L (ref 98–111)
Creatinine, Ser: 0.9 mg/dL (ref 0.44–1.00)
GFR, Estimated: >60 mL/min (ref >60)
Glucose, Bld: 98 mg/dL (ref 70–99)
Potassium: 4 mmol/L (ref 3.5–5.1)
Sodium: 137 mmol/L (ref 135–145)
Total Bilirubin: 1.1 mg/dL (ref 0.3–1.2)
Total Protein: 7.5 g/dL (ref 6.5–8.1)

## 2021-12-05 LAB — URINALYSIS, ROUTINE W REFLEX MICROSCOPIC
Bilirubin Urine: NEGATIVE
Glucose, UA: NEGATIVE mg/dL
Ketones, ur: NEGATIVE mg/dL
Nitrite: POSITIVE — AB
Protein, ur: 30 mg/dL — AB
Specific Gravity, Urine: 1.025 (ref 1.005–1.030)
pH: 6 (ref 5.0–8.0)

## 2021-12-05 LAB — CBC
HCT: 36.2 % (ref 36.0–46.0)
Hemoglobin: 11.8 g/dL — ABNORMAL LOW (ref 12.0–15.0)
MCH: 32.2 pg (ref 26.0–34.0)
MCHC: 32.6 g/dL (ref 30.0–36.0)
MCV: 98.6 fL (ref 80.0–100.0)
Platelets: 317 10*3/uL (ref 150–400)
RBC: 3.67 MIL/uL — ABNORMAL LOW (ref 3.87–5.11)
RDW: 12.8 % (ref 11.5–15.5)
WBC: 22.4 10*3/uL — ABNORMAL HIGH (ref 4.0–10.5)
nRBC: 0 % (ref 0.0–0.2)

## 2021-12-05 LAB — I-STAT BETA HCG BLOOD, ED (MC, WL, AP ONLY): I-stat hCG, quantitative: <5 m[IU]/mL (ref <5)

## 2021-12-05 LAB — URINALYSIS, MICROSCOPIC (REFLEX): WBC, UA: >50 WBC/hpf (ref 0–5)

## 2021-12-05 LAB — LIPASE, BLOOD: Lipase: 21 U/L (ref 11–51)

## 2021-12-05 NOTE — ED Triage Notes (Signed)
Pt arrived POV from home c/o suprapubic abdominal pain x2 days. Pt also endorses diarrhea that started last night. Pt states she had one episode of vomiting last night.

## 2021-12-05 NOTE — ED Provider Triage Note (Addendum)
Emergency Medicine Provider Triage Evaluation Note  [name] , a 41 y.o. female  was evaluated in triage.  Pt complains of abdominal pain onset 2 days.  Denies sick contacts or new foods or medications.  Has associated diarrhea, nonbloody emesis x1 episode.  She notes a burning sensation to her epigastric region.  Denies history of GERD.  Has not tried any medications for symptoms.  Denies chest pain, shortness of breath, fever, chills, dysuria, hematuria.  Patient has a history of irregular periods and is on her period currently.  Review of Systems  Positive: As per HPI above Negative: Chest pain, shortness of breath  Physical Exam  BP 101/72 (BP Location: Right Arm)    Pulse (!) 109    Temp 98.7 F (37.1 C) (Oral)    Resp 18    Ht 6\' 2"  (1.88 m)    Wt (!) 217.7 kg    SpO2 96%    BMI 61.63 kg/m  Gen:   Awake, no distress   Resp:  Normal effort  MSK:   Moves extremities without difficulty  Other:  Mild epigastric tenderness to palpation.  Medical Decision Making  Medically screening exam initiated at 5:47 PM.  Appropriate orders placed.  [name] was informed that the remainder of the evaluation will be completed by another provider, this initial triage assessment does not replace that evaluation, and the importance of remaining in the ED until their evaluation is complete.    Meshelle Holness A, PA-C 12/05/21 1746    Eleazar Kimmey A, PA-C 12/05/21 1747

## 2021-12-06 ENCOUNTER — Emergency Department (HOSPITAL_COMMUNITY): Payer: 59

## 2021-12-06 MED ORDER — MORPHINE SULFATE (PF) 4 MG/ML IV SOLN
4.0000 mg | Freq: Once | INTRAVENOUS | Status: AC
  Administered 2021-12-06: 4 mg via INTRAVENOUS
  Filled 2021-12-06: qty 1

## 2021-12-06 MED ORDER — CEPHALEXIN 500 MG PO CAPS: 500.0000 mg | ORAL_CAPSULE | Freq: Three times a day (TID) | ORAL | 0 refills | Status: DC

## 2021-12-06 MED ORDER — METRONIDAZOLE 500 MG PO TABS
2000.0000 mg | ORAL_TABLET | Freq: Once | ORAL | Status: AC
  Administered 2021-12-06: 2000 mg via ORAL
  Filled 2021-12-06: qty 4

## 2021-12-06 MED ORDER — IOHEXOL 300 MG/ML  SOLN
100.0000 mL | Freq: Once | INTRAMUSCULAR | Status: AC | PRN
  Administered 2021-12-06: 100 mL via INTRAVENOUS

## 2021-12-06 MED ORDER — ONDANSETRON HCL 4 MG/2ML IJ SOLN
4.0000 mg | Freq: Once | INTRAMUSCULAR | Status: AC
  Administered 2021-12-06: 4 mg via INTRAVENOUS
  Filled 2021-12-06: qty 2

## 2021-12-06 MED ORDER — SODIUM CHLORIDE 0.9 % IV SOLN
1.0000 g | Freq: Once | INTRAVENOUS | Status: AC
  Administered 2021-12-06: 1 g via INTRAVENOUS
  Filled 2021-12-06: qty 10

## 2021-12-06 MED ORDER — DOXYCYCLINE HYCLATE 100 MG PO CAPS: 100.0000 mg | ORAL_CAPSULE | Freq: Two times a day (BID) | ORAL | 0 refills | Status: DC

## 2021-12-06 NOTE — ED Notes (Signed)
Patient verbalizes understanding of discharge instructions. Opportunity for questioning and answers were provided. Armband removed by staff, pt discharged from ED via wheelchair to lobby to return home.  

## 2021-12-06 NOTE — Discharge Instructions (Signed)
You were seen today for lower abdominal pain.  You have both a UTI and an STD.  You need to abstain from sexual activity for the next 10 days.  Take antibiotics as prescribed.  Your CT scan shows some redundancy of your urinary tract system.  Follow-up with urology as needed.

## 2021-12-06 NOTE — ED Provider Notes (Signed)
[facility] EMERGENCY DEPARTMENT Provider Note   CSN: [id number] Arrival date & time: 12/05/21  1601     History  Chief Complaint  Patient presents with   Abdominal Pain    [name] is a 41 y.o. female.  HPI    This is a 41 year old female who presents with a 1 to 2-day history of lower abdominal pain.  Patient reports lower abdominal crampy pain that is nonradiating.  She had 1 episode of vomiting last night.  No back or flank pain.  Denies fevers.  Has had some diarrhea.  She states that nothing makes her pain better or worse.  She has not taken anything for the pain.  Denies dysuria.  She is sexually active.  Denies vaginal discharge.  Last menstrual period was last month and "heavier than normal."  Home Medications Prior to Admission medications   Medication Sig Start Date End Date Taking? Authorizing Provider  acetaminophen (TYLENOL) 500 MG tablet Take 500 mg by mouth every 6 (six) hours as needed.   Yes [provider]  albuterol (VENTOLIN HFA) 108 (90 Base) MCG/ACT inhaler Inhale 2 puffs into the lungs every 6 (six) hours as needed. 06/11/21  Yes [provider]  cephALEXin (KEFLEX) 500 MG capsule Take 1 capsule (500 mg total) by mouth 3 (three) times daily. 12/06/21  Yes [place] Condon, Barbette Hair, MD  doxycycline (VIBRAMYCIN) 100 MG capsule Take 1 capsule (100 mg total) by mouth 2 (two) times daily. 12/06/21  Yes Lakeidra Reliford, Barbette Hair, MD  ibuprofen (ADVIL) 200 MG tablet Take 3 tablets (600 mg total) by mouth every 8 (eight) hours as needed for moderate pain. Patient not taking: Reported on 09/14/2020 03/11/20   Orvan July, NP  PRESCRIPTION MEDICATION Take 1 tablet by mouth daily. Birth control Patient not taking: Reported on 12/06/2021    [provider]      Allergies    Bactrim [sulfamethoxazole-trimethoprim] and Sulfa antibiotics    Review of Systems   Review of Systems  Constitutional:  Negative for fever.  Respiratory:   Negative for shortness of breath.   Cardiovascular:  Negative for chest pain.  Gastrointestinal:  Positive for abdominal pain, diarrhea, nausea and vomiting.  Genitourinary:  Negative for dysuria and vaginal discharge.  All other systems reviewed and are negative.  Physical Exam Updated Vital Signs BP 115/66    Pulse 89    Temp 98.2 F (36.8 C) (Oral)    Resp 16    Ht 1.88 m (6\' 2" )    Wt (!) 217.7 kg    SpO2 100%    BMI 61.63 kg/m  Physical Exam Vitals and nursing note reviewed.  Constitutional:      Appearance: She is well-developed.     Comments: Morbidly obese,  HENT:     Head: Normocephalic and atraumatic.  Eyes:     Pupils: Pupils are equal, round, and reactive to light.  Cardiovascular:     Rate and Rhythm: Regular rhythm. Tachycardia present.     Heart sounds: Normal heart sounds.  Pulmonary:     Effort: Pulmonary effort is normal. No respiratory distress.     Breath sounds: No wheezing.  Abdominal:     General: Bowel sounds are normal.     Palpations: Abdomen is soft.     Tenderness: There is abdominal tenderness in the suprapubic area. There is no right CVA tenderness, left CVA tenderness, guarding or rebound.  Musculoskeletal:     Cervical back: Neck supple.  Skin:    General: Skin is warm and dry.  Neurological:     Mental Status: She is alert and oriented to person, place, and time.  Psychiatric:        Mood and Affect: Mood normal.    ED Results / Procedures / Treatments   Labs (all labs ordered are listed, but only abnormal results are displayed) Labs Reviewed  COMPREHENSIVE METABOLIC PANEL - Abnormal; Notable for the following components:      Result Value   AST 14 (*)    All other components within normal limits  CBC - Abnormal; Notable for the following components:   WBC 22.4 (*)    RBC 3.67 (*)    Hemoglobin 11.8 (*)    All other components within normal limits  URINALYSIS, ROUTINE W REFLEX MICROSCOPIC - Abnormal; Notable for the following  components:   Color, Urine ORANGE (*)    APPearance CLOUDY (*)    Hgb urine dipstick LARGE (*)    Protein, ur 30 (*)    Nitrite POSITIVE (*)    Leukocytes,Ua MODERATE (*)    All other components within normal limits  URINALYSIS, MICROSCOPIC (REFLEX) - Abnormal; Notable for the following components:   Bacteria, UA FEW (*)    Trichomonas, UA PRESENT (*)    All other components within normal limits  URINE CULTURE  LIPASE, BLOOD  I-STAT BETA HCG BLOOD, ED ([facility], [facility], [facility] ONLY)  GC/CHLAMYDIA PROBE AMP ([place]) NOT AT [facility]    EKG None  Radiology CT ABDOMEN PELVIS W CONTRAST  Result Date: 12/06/2021 CLINICAL DATA:  UTI, recurrent/complicated. EXAM: CT ABDOMEN AND PELVIS WITH CONTRAST TECHNIQUE: Multidetector CT imaging of the abdomen and pelvis was performed using the standard protocol following bolus administration of intravenous contrast. RADIATION DOSE REDUCTION: This exam was performed according to the departmental dose-optimization program which includes automated exposure control, adjustment of the mA and/or kV according to patient size and/or use of iterative reconstruction technique. CONTRAST:  1106mL OMNIPAQUE IOHEXOL 300 MG/ML  SOLN COMPARISON:  None. FINDINGS: Lower chest: No acute abnormality. Hepatobiliary: No focal liver abnormality is seen. No gallstones, gallbladder wall thickening, or biliary dilatation. Pancreas: Unremarkable. No pancreatic ductal dilatation or surrounding inflammatory changes. Spleen: Normal in size without focal abnormality. Adrenals/Urinary Tract: The adrenal glands are within normal limits. Renal calculi are present bilaterally. No ureteral calculus or obstructive uropathy on the right. 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 There is dilatation of the upper pole collecting system on the left to the level of the bladder. The bladder is within normal limits. Stomach/Bowel: No bowel obstruction, free air or pneumatosis. A normal appendix is  noted in the right lower quadrant. A few scattered diverticula are seen along the colon without evidence of diverticulitis. There is fatty infiltration of the walls of the colon, suggesting chronic inflammatory changes. Vascular/Lymphatic: No significant vascular findings are present. No enlarged abdominal or pelvic lymph nodes. Reproductive: Uterus and bilateral adnexa are unremarkable. Other: No abdominal wall hernia or abnormality. No abdominopelvic ascites. Musculoskeletal: Mild degenerative changes in the thoracolumbar spine. No acute osseous abnormality. IMPRESSION: 1. Bilateral nephrolithiasis. There are at least partially duplicated collecting systems bilaterally with dilatation of the upper pole ureter on the left the level of the urinary bladder. No ureteral calculus is identified. 2. Diverticulosis without diverticulitis. Electronically Signed   By: Brett Fairy M.D.   On: 12/06/2021 04:42    Procedures Procedures    Medications Ordered in ED Medications  morphine 4 MG/ML injection  4 mg (4 mg Intravenous Given 12/06/21 0430)  ondansetron (ZOFRAN) injection 4 mg (4 mg Intravenous Given 12/06/21 0431)  metroNIDAZOLE (FLAGYL) tablet 2,000 mg (2,000 mg Oral Given 12/06/21 0431)  cefTRIAXone (ROCEPHIN) 1 g in sodium chloride 0.9 % 100 mL IVPB (0 g Intravenous Stopped 12/06/21 0520)  iohexol (OMNIPAQUE) 300 MG/ML solution 100 mL (100 mLs Intravenous Contrast Given 12/06/21 0424)    ED Course/ Medical Decision Making/ A&P                           Medical Decision Making  This patient presents to the ED for concern of abdominal pain, this involves an extensive number of treatment options, and is a complaint that carries with it a high risk of complications and morbidity.  The differential diagnosis includes UTI, appendicitis, PID, diverticulitis  MDM:    This is a 41 year old female who presents with lower abdominal pain.  Over the last 1 to 2 days.  Afebrile.  Does report some emesis but no  diarrhea.  Denies dysuria or vaginal discharge.  She is nontoxic and vital signs are reassuring.  She has some lower diffuse abdominal tenderness.  Labs reviewed from triage.  Notable leukocytosis to 22.  No significant metabolic derangements.  Urinalysis is nitrite positive with greater than 50 white cells and white blood cell clumps.  Urine culture was sent.  Patient was treated with IV Rocephin.  Incidentally noted to have trichomonas in the urine.  Patient does not have any symptoms of STDs at this time.  I did add on GC testing.  Given leukocytosis, CT scan was performed to rule out pyelonephritis or infected stone.  CT scan does not show any evidence of Pyelo or stone.  She does have some redundancy of her renal collecting system.  There is no TOA or other intra-abdominal infection either.  Patient remained hemodynamically stable.  She is not septic appearing.  Given IV Rocephin for UTI, Flagyl for trichomoniasis and will be discharged with doxycycline for PID/STD.  She will additionally be given a prescription for Keflex for UTI.  Patient is agreeable to plan. (Labs, imaging)  Labs: I Ordered, and personally interpreted labs.  The pertinent results include: Nitrite positive urine with greater than 50 white cells and trichomonas, leukocytosis to 22  Imaging Studies ordered: I ordered imaging studies including CT abdomen pelvis, no obvious pyelonephritis or kidney stone I independently visualized and interpreted imaging. I agree with the radiologist interpretation  Additional history obtained from chart review.  External records from outside source obtained and reviewed including chart review  Critical Interventions: IV Rocephin  Consultations: I requested consultation with the none,  and discussed lab and imaging findings as well as pertinent plan - they recommend: None  Cardiac Monitoring: The patient was maintained on a cardiac monitor.  I personally viewed and interpreted the cardiac  monitored which showed an underlying rhythm of: Normal sinus rhythm  Reevaluation: After the interventions noted above, I reevaluated the patient and found that they have :improved  Social Determinants of Health: Patient lives independently  Disposition:  discharge  Co morbidities that complicate the patient evaluation  Past Medical History:  Diagnosis Date   Hypertension    "just today" (08/30/2017)   Lymphedema of both lower extremities    Morbid obesity ([place])      Medicines Meds ordered this encounter  Medications   morphine 4 MG/ML injection 4 mg   ondansetron (ZOFRAN) injection 4 mg  metroNIDAZOLE (FLAGYL) tablet 2,000 mg   cefTRIAXone (ROCEPHIN) 1 g in sodium chloride 0.9 % 100 mL IVPB    Order Specific Question:   Antibiotic Indication:    Answer:   UTI   iohexol (OMNIPAQUE) 300 MG/ML solution 100 mL   doxycycline (VIBRAMYCIN) 100 MG capsule    Sig: Take 1 capsule (100 mg total) by mouth 2 (two) times daily.    Dispense:  28 capsule    Refill:  0   cephALEXin (KEFLEX) 500 MG capsule    Sig: Take 1 capsule (500 mg total) by mouth 3 (three) times daily.    Dispense:  21 capsule    Refill:  0    I have reviewed the patients home medicines and have made adjustments as needed  Problem List / ED Course: Problem List Items Addressed This Visit   None Visit Diagnoses     Lower abdominal pain    -  Primary   Acute cystitis without hematuria       PID (acute pelvic inflammatory disease)       Relevant Medications   metroNIDAZOLE (FLAGYL) tablet 2,000 mg (Completed)   cefTRIAXone (ROCEPHIN) 1 g in sodium chloride 0.9 % 100 mL IVPB (Completed)   cephALEXin (KEFLEX) 500 MG capsule   Trichimoniasis       Relevant Medications   metroNIDAZOLE (FLAGYL) tablet 2,000 mg (Completed)   cefTRIAXone (ROCEPHIN) 1 g in sodium chloride 0.9 % 100 mL IVPB (Completed)   cephALEXin (KEFLEX) 500 MG capsule                   Final Clinical Impression(s) / ED  Diagnoses Final diagnoses:  Lower abdominal pain  Acute cystitis without hematuria  PID (acute pelvic inflammatory disease)  Trichimoniasis    Rx / DC Orders ED Discharge Orders          Ordered    doxycycline (VIBRAMYCIN) 100 MG capsule  2 times daily        12/06/21 0525    cephALEXin (KEFLEX) 500 MG capsule  3 times daily        12/06/21 0525              Harold Moncus, Barbette Hair, MD 12/06/21 [phone]

## 2021-12-07 LAB — URINE CULTURE: Culture: >=100000 — AB

## 2021-12-08 ENCOUNTER — Telehealth: Payer: Self-pay | Admitting: *Deleted

## 2021-12-08 NOTE — Telephone Encounter (Signed)
Post ED Visit - Positive Culture Follow-up  Culture report reviewed by antimicrobial stewardship pharmacist: [facility] []  , Pharm.D. []  Enzo Bi, .D., BCPS AQ-ID []  Celedonio Miyamoto, Pharm.D., BCPS []  [address], Pharm.D., BCPS []  [place], Garvin Fila.D., BCPS, AAHIVP []  , Pharm.D., BCPS, AAHIVP []  Georgina Pillion, PharmD, BCPS []  , PharmD, BCPS []  [place], PharmD, BCPS []  [place], PharmD []  , PharmD, BCPS []  Estella Husk, PharmD  Pharmacy Team []  Lysle Pearl, PharmD []  , PharmD []  Phillips Climes, PharmD []  , Rph []  Agapito Games) , PharmD []  Verlan Friends, PharmD []  , PharmD []  Mervyn Gay, PharmD []  , PharmD []  Vinnie Level, PharmD []  [facility], PharmD []  , PharmD []  Len Childs, PharmD   Positive urine culture Treated with Cephalexin, organism sensitive to the same and no further patient follow-up is required at this time.  [facility] 12/08/2021, 10:22 AM

## 2022-09-14 ENCOUNTER — Ambulatory Visit (HOSPITAL_COMMUNITY)
Admission: EM | Admit: 2022-09-14 | Discharge: 2022-09-14 | Disposition: A | Discharge status: Home / Self Care | Payer: Self-pay | Attending: Physician Assistant | Admitting: Physician Assistant

## 2022-09-14 ENCOUNTER — Encounter (HOSPITAL_COMMUNITY): Payer: Self-pay

## 2022-09-14 DIAGNOSIS — H109 Unspecified conjunctivitis: Secondary | ICD-10-CM

## 2022-09-14 MED ORDER — OFLOXACIN 0.3 % OP SOLN: 1.0000 [drp] | Freq: Four times a day (QID) | OPHTHALMIC | 0 refills | Status: DC

## 2022-09-14 NOTE — ED Provider Notes (Signed)
[place]    CSN: [id number] Arrival date & time: 09/14/22  1838      History   Chief Complaint Chief Complaint  Patient presents with   Eye Drainage    HPI [name] is a 41 y.o. female.   Patient presents today with a 2-day history of redness and drainage from her right eye.  She denies any ocular trauma.  Denies any vision change, photosensitivity, eye pain, headache, dizziness, nausea, vomiting.  Reports that when she woke up yesterday and this morning she had difficulty opening her eye due to the amount of drainage.  She denies any known sick contacts but does work at a daycare.  Reports that she was sick last week but the symptoms of URI have resolved and she denies any current cough, congestion, fever.  She does not wear glasses or contacts.  She has not tried any over-the-counter medication for symptom management.  She denies any foreign body sensation.    Past Medical History:  Diagnosis Date   Hypertension    "just today" (08/30/2017)   Lymphedema of both lower extremities    Morbid obesity ([place])     Patient Active Problem List   Diagnosis Date Noted   HTN (hypertension) 04/11/2018   Lymphedema of lower extremity 04/11/2018   Morbid obesity with BMI of 50.0-59.9, adult ([place]) 04/11/2018   Cellulitis 04/11/2018   Multiple open wounds of left lower extremity 09/01/2017   Papular rash, localized 09/01/2017   Sepsis ([place]) 09/01/2015   Lymphedema 09/01/2015   Cellulitis of right lower extremity 09/01/2015    Past Surgical History:  Procedure Laterality Date   NO PAST SURGERIES      OB History     Gravida  0   Para      Term      Preterm      AB      Living         SAB      IAB      Ectopic      Multiple      Live Births               Home Medications    Prior to Admission medications   Medication Sig Start Date End Date Taking? Authorizing Provider  ofloxacin (OCUFLOX) 0.3 % ophthalmic solution Place 1 drop  into the right eye 4 (four) times daily. 09/14/22  Yes Adream Parzych, Derry Skill, PA-C  acetaminophen (TYLENOL) 500 MG tablet Take 500 mg by mouth every 6 (six) hours as needed.    [provider]  albuterol (VENTOLIN HFA) 108 (90 Base) MCG/ACT inhaler Inhale 2 puffs into the lungs every 6 (six) hours as needed. 06/11/21   [provider]  ibuprofen (ADVIL) 200 MG tablet Take 3 tablets (600 mg total) by mouth every 8 (eight) hours as needed for moderate pain. Patient not taking: Reported on 09/14/2020 03/11/20   Orvan July, NP  PRESCRIPTION MEDICATION Take 1 tablet by mouth daily. Birth control Patient not taking: Reported on 12/06/2021    [provider]    Family History Family History  Problem Relation Age of Onset   Renal Disease Mother     Social History Social History   Tobacco Use   Smoking status: Never   Smokeless tobacco: Never  Vaping Use   Vaping Use: Never used  Substance Use Topics   Alcohol use: No   Drug use: No     Allergies  Bactrim [sulfamethoxazole-trimethoprim] and Sulfa antibiotics   Review of Systems Review of Systems  Constitutional:  Positive for activity change. Negative for appetite change, fatigue and fever.  Eyes:  Positive for discharge and redness. Negative for photophobia, pain, itching and visual disturbance.  Respiratory:  Negative for cough and shortness of breath.   Cardiovascular:  Negative for chest pain.  Gastrointestinal:  Negative for abdominal pain, diarrhea, nausea and vomiting.  Neurological:  Negative for dizziness, light-headedness and headaches.     Physical Exam Triage Vital Signs ED Triage Vitals [09/14/22 1935]  Enc Vitals Group     BP (!) 150/89     Pulse Rate 82     Resp 12     Temp 97.7 F (36.5 C)     Temp Source Oral     SpO2 98 %     Weight      Height      Head Circumference      Peak Flow      Pain Score 0     Pain Loc      Pain Edu?      Excl. in [place]?    No data found.  Updated  Vital Signs BP (!) 150/89 (BP Location: Left Arm)   Pulse 82   Temp 97.7 F (36.5 C) (Oral)   Resp 12   LMP  (LMP Unknown)   SpO2 98%   Visual Acuity Right Eye Distance: 20/20 Left Eye Distance: 20/20 Bilateral Distance: 20/20  Right Eye Near:   Left Eye Near:    Bilateral Near:     Physical Exam Vitals reviewed.  Constitutional:      General: She is awake. She is not in acute distress.    Appearance: Normal appearance. She is well-developed. She is not ill-appearing.     Comments: Very pleasant female appears stated age in no acute distress sitting comfortably in exam room  HENT:     Head: Normocephalic and atraumatic.  Eyes:     Extraocular Movements:     Right eye: Normal extraocular motion.     Left eye: Normal extraocular motion.     Conjunctiva/sclera:     Right eye: Right conjunctiva is injected. No chemosis or exudate.    Left eye: Left conjunctiva is not injected. No chemosis or exudate.    Pupils: Pupils are equal, round, and reactive to light.  Cardiovascular:     Rate and Rhythm: Normal rate and regular rhythm.     Heart sounds: Normal heart sounds, S1 normal and S2 normal. No murmur heard. Pulmonary:     Effort: Pulmonary effort is normal.     Breath sounds: Normal breath sounds. No wheezing, rhonchi or rales.     Comments: Clear to auscultation bilaterally Psychiatric:        Behavior: Behavior is cooperative.      UC Treatments / Results  Labs (all labs ordered are listed, but only abnormal results are displayed) Labs Reviewed - No data to display  EKG   Radiology No results found.  Procedures Procedures (including critical care time)  Medications Ordered in UC Medications - No data to display  Initial Impression / Assessment and Plan / UC Course  I have reviewed the triage vital signs and the nursing notes.  Pertinent labs & imaging results that were available during my care of the patient were reviewed by me and considered in my  medical decision making (see chart for details).     Fluorescein staining was deferred  as patient denied any ocular trauma or foreign body sensation.  Vision is normal in clinic today.  Given unilateral distribution with significant overnight drainage will cover for bacterial conjunctivitis.  Patient was started on ofloxacin 4 times daily.  Discussed that she should make sure to wash her hands and avoid touching tip of medication bottle to her eye to prevent contamination.  She can use lubricating eyedrops for symptom relief.  If her symptoms are not improving quickly she is to follow-up with ophthalmology was given contact information for local provider.  Discussed that if she has any worsening symptoms including ocular pain, fever, nausea, vomiting, visual disturbance she needs to be seen immediately.  Strict return precautions given.  Work excuse note provided.  Final Clinical Impressions(s) / UC Diagnoses   Final diagnoses:  Bacterial conjunctivitis of right eye     Discharge Instructions      It appears I am good you have an infection in your eye.  Use drops 4 times daily.  You can use lubricating eyedrops for additional symptom relief.  Make sure to wash your hands prior to daily medication.  Do not touch tip of medication bottle to the eye to prevent contamination.  If your symptoms or not improving quickly please follow-up with eye doctor; call to schedule an appointment.  If at any point you have fever, headache, dizziness, visual change, nausea, vomiting you need to go to the emergency room.     ED Prescriptions     Medication Sig Dispense Auth. Provider   ofloxacin (OCUFLOX) 0.3 % ophthalmic solution Place 1 drop into the right eye 4 (four) times daily. 5 mL Gregroy Dombkowski K, PA-C      PDMP not reviewed this encounter.   Terrilee Croak, PA-C 09/14/22 2010

## 2022-09-14 NOTE — Discharge Instructions (Addendum)
It appears I am good you have an infection in your eye.  Use drops 4 times daily.  You can use lubricating eyedrops for additional symptom relief.  Make sure to wash your hands prior to daily medication.  Do not touch tip of medication bottle to the eye to prevent contamination.  If your symptoms or not improving quickly please follow-up with eye doctor; call to schedule an appointment.  If at any point you have fever, headache, dizziness, visual change, nausea, vomiting you need to go to the emergency room.

## 2022-09-14 NOTE — ED Triage Notes (Signed)
Pt woke up with right eye drainage and itching

## 2023-05-20 ENCOUNTER — Other Ambulatory Visit: Payer: Self-pay

## 2023-05-20 ENCOUNTER — Encounter (HOSPITAL_COMMUNITY): Payer: Self-pay | Admitting: Emergency Medicine

## 2023-05-20 ENCOUNTER — Emergency Department (HOSPITAL_COMMUNITY)
Admission: EM | Admit: 2023-05-20 | Discharge: 2023-05-20 | Disposition: A | Discharge status: Home / Self Care | Payer: Self-pay | Attending: Emergency Medicine | Admitting: Emergency Medicine

## 2023-05-20 ENCOUNTER — Emergency Department (HOSPITAL_COMMUNITY): Payer: Self-pay

## 2023-05-20 DIAGNOSIS — S92515A Nondisplaced fracture of proximal phalanx of left lesser toe(s), initial encounter for closed fracture: Secondary | ICD-10-CM | POA: Insufficient documentation

## 2023-05-20 DIAGNOSIS — W2209XA Striking against other stationary object, initial encounter: Secondary | ICD-10-CM | POA: Insufficient documentation

## 2023-05-20 NOTE — ED Triage Notes (Signed)
Stubbed toe on night stand Friday morning and has been experience L pinkie toe pain since then.   H/o lymphedema  Tried motrin today with some relief.

## 2023-05-20 NOTE — ED Provider Notes (Signed)
[place] EMERGENCY DEPARTMENT AT [facility] Provider Note   CSN: [id number] Arrival date & time: 05/20/23  1909     History  Chief Complaint  Patient presents with   Toe Injury    [name] is a 42 y.o. female.  Patient is a 42 year old female who presents with pain in her left fifth toe.  She said that 2 days ago she excellently kicked it on a nightstand and's been swollen and painful since that time.  She was concerned it may be fractured.  She denies any other injuries.       Home Medications Prior to Admission medications   Medication Sig Start Date End Date Taking? Authorizing Provider  acetaminophen (TYLENOL) 500 MG tablet Take 500 mg by mouth every 6 (six) hours as needed.    [provider]  albuterol (VENTOLIN HFA) 108 (90 Base) MCG/ACT inhaler Inhale 2 puffs into the lungs every 6 (six) hours as needed. 06/11/21   [provider]  ibuprofen (ADVIL) 200 MG tablet Take 3 tablets (600 mg total) by mouth every 8 (eight) hours as needed for moderate pain. Patient not taking: Reported on 09/14/2020 03/11/20   Dahlia Byes A, NP  ofloxacin (OCUFLOX) 0.3 % ophthalmic solution Place 1 drop into the right eye 4 (four) times daily. 09/14/22   Raspet, Noberto Retort, PA-C  PRESCRIPTION MEDICATION Take 1 tablet by mouth daily. Birth control Patient not taking: Reported on 12/06/2021    [provider]      Allergies    Bactrim [sulfamethoxazole-trimethoprim] and Sulfa antibiotics    Review of Systems   Review of Systems  Constitutional:  Negative for fever.  Gastrointestinal:  Negative for nausea and vomiting.  Musculoskeletal:  Positive for arthralgias and joint swelling. Negative for back pain and neck pain.  Skin:  Negative for wound.  Neurological:  Negative for weakness, numbness and headaches.    Physical Exam Updated Vital Signs BP (!) 148/94 (BP Location: Right Arm)   Pulse 95   Temp 98.2 F (36.8 C) (Oral)   Resp 18   Ht 6'  2" (1.88 m)   Wt (!) 217 kg   SpO2 95%   BMI 61.42 kg/m  Physical Exam Constitutional:      Appearance: She is well-developed.  HENT:     Head: Normocephalic and atraumatic.  Cardiovascular:     Rate and Rhythm: Normal rate.  Pulmonary:     Effort: Pulmonary effort is normal.  Musculoskeletal:        General: Tenderness present.     Cervical back: Normal range of motion and neck supple.     Comments: Patient has chronic lymphedema both legs.  She has some tenderness and ecchymosis over the fifth toe of the left foot.  No pain to the remainder of the foot.  No open wounds are noted.  Capillary fill is less than 2 distally.  Skin:    General: Skin is warm and dry.  Neurological:     Mental Status: She is alert and oriented to person, place, and time.     ED Results / Procedures / Treatments   Labs (all labs ordered are listed, but only abnormal results are displayed) Labs Reviewed - No data to display  EKG None  Radiology DG Foot Complete Left  Result Date: 05/20/2023 CLINICAL DATA:  Patient stubbed foot today with resulting fifth toe pain. EXAM: LEFT FOOT - COMPLETE 3+ VIEW COMPARISON:  None Available. FINDINGS: Subtle cortical irregularity at  the lateral aspect of the head of the fifth toe proximal phalanx. There is no evidence of dislocation. The adjacent joint space is maintained. There is no evidence of arthropathy or other focal bone abnormality. 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 Small plantar calcaneal enthesophyte. IMPRESSION: Subtle cortical irregularity at the lateral aspect of the head of the fifth toe proximal phalanx, which could represent a nondisplaced fracture. Electronically Signed   By: Sherron Ales M.D.   On: 05/20/2023 19:56    Procedures Procedures    Medications Ordered in ED Medications - No data to display  ED Course/ Medical Decision Making/ A&P                             Medical Decision Making Amount  and/or Complexity of Data Reviewed Radiology: ordered.   Patient presents with pain to her left fifth toe.  X-rays were performed which were interpreted by me and confirmed by the radiologist to show a linear fracture through the distal aspect of the proximal phalanx of the left foot, fifth digit.  The toes were buddy taped.  She was given a postop shoe to wear.  She was advised on symptomatic care.  She was told to follow-up with her primary care doctor for follow-up.  She was given information about following up with orthopedist if she is unable to follow-up with her primary care doctor.  Return precautions were given.  Final Clinical Impression(s) / ED Diagnoses Final diagnoses:  Closed nondisplaced fracture of proximal phalanx of lesser toe of left foot, initial encounter    Rx / DC Orders ED Discharge Orders     None         Rolan Bucco, MD 05/20/23 2015

## 2023-05-20 NOTE — Discharge Instructions (Addendum)
Change the buddy taping whenever it gets dirty or wet.  Follow-up with your primary care doctor or the orthopedist listed above to make sure that it is healing okay.  Return to emergency room if you have any worsening symptoms.

## 2023-06-14 ENCOUNTER — Emergency Department (HOSPITAL_COMMUNITY): Payer: Self-pay

## 2023-06-14 ENCOUNTER — Emergency Department (HOSPITAL_COMMUNITY)
Admission: EM | Admit: 2023-06-14 | Discharge: 2023-06-14 | Disposition: A | Discharge status: Home / Self Care | Payer: Self-pay | Attending: Emergency Medicine | Admitting: Emergency Medicine

## 2023-06-14 ENCOUNTER — Encounter (HOSPITAL_COMMUNITY): Payer: Self-pay

## 2023-06-14 ENCOUNTER — Other Ambulatory Visit: Payer: Self-pay

## 2023-06-14 DIAGNOSIS — Y990 Civilian activity done for income or pay: Secondary | ICD-10-CM | POA: Diagnosis not present

## 2023-06-14 DIAGNOSIS — W010XXA Fall on same level from slipping, tripping and stumbling without subsequent striking against object, initial encounter: Secondary | ICD-10-CM | POA: Insufficient documentation

## 2023-06-14 DIAGNOSIS — I10 Essential (primary) hypertension: Secondary | ICD-10-CM | POA: Diagnosis not present

## 2023-06-14 DIAGNOSIS — W19XXXA Unspecified fall, initial encounter: Secondary | ICD-10-CM

## 2023-06-14 DIAGNOSIS — M25561 Pain in right knee: Secondary | ICD-10-CM | POA: Insufficient documentation

## 2023-06-14 NOTE — ED Triage Notes (Signed)
Patient presented to ER for fall. Endorses hitting R knee. Patient denies hitting head, states she slipped and hit knee. Ambulatory to triage with a limp.

## 2023-06-14 NOTE — ED Provider Notes (Signed)
[place] EMERGENCY DEPARTMENT AT [facility] Provider Note   CSN: [id number] Arrival date & time: 06/14/23  1255     History  Chief Complaint  Patient presents with   Knee Pain    [name] is a 42 y.o. female.  [name] is a 42 y.o. female with history of obesity, hypertension and lymphedema, who presents to the emergency department for evaluation of right knee pain after a fall.  Patient reports she slipped and fell at work landing on her right knee.  She denies hitting her head or any other injuries from the fall.  She was encouraged to get checked out by her employer after fall occurred today.  She has been able to walk for reports some pain with weightbearing.  No numbness or weakness.  The history is provided by the patient.  Knee Pain Associated symptoms: no fever        Home Medications Prior to Admission medications   Medication Sig Start Date End Date Taking? Authorizing Provider  acetaminophen (TYLENOL) 500 MG tablet Take 500 mg by mouth every 6 (six) hours as needed.    [provider]  albuterol (VENTOLIN HFA) 108 (90 Base) MCG/ACT inhaler Inhale 2 puffs into the lungs every 6 (six) hours as needed. 06/11/21   [provider]  ibuprofen (ADVIL) 200 MG tablet Take 3 tablets (600 mg total) by mouth every 8 (eight) hours as needed for moderate pain. Patient not taking: Reported on 09/14/2020 03/11/20   Dahlia Byes A, NP  ofloxacin (OCUFLOX) 0.3 % ophthalmic solution Place 1 drop into the right eye 4 (four) times daily. 09/14/22   Raspet, Noberto Retort, PA-C  PRESCRIPTION MEDICATION Take 1 tablet by mouth daily. Birth control Patient not taking: Reported on 12/06/2021    [provider]      Allergies    Bactrim [sulfamethoxazole-trimethoprim] and Sulfa antibiotics    Review of Systems   Review of Systems  Constitutional:  Negative for chills and fever.  Musculoskeletal:  Positive for arthralgias.    Physical Exam Updated  Vital Signs BP (!) 158/90   Pulse 80   Temp 98 F (36.7 C) (Oral)   Resp 18   Ht 6\' 2"  (1.88 m)   Wt (!) 198.2 kg   SpO2 98%   BMI 56.11 kg/m  Physical Exam Vitals and nursing note reviewed.  Constitutional:      General: She is not in acute distress.    Appearance: Normal appearance. She is well-developed. She is not ill-appearing or diaphoretic.  HENT:     Head: Normocephalic and atraumatic.  Eyes:     General:        Right eye: No discharge.        Left eye: No discharge.  Pulmonary:     Effort: Pulmonary effort is normal. No respiratory distress.  Musculoskeletal:        General: Tenderness present.     Comments: On exam there is mild tenderness to palpation over the anterior right knee without significant swelling or effusion noted, patient is able to fully flex and extend the knee without significant pain, no palpable deformity or joint laxity noted.  Distal pulses 2+, sensation and strength intact  Neurological:     Mental Status: She is alert and oriented to person, place, and time.     Coordination: Coordination normal.  Psychiatric:        Mood and Affect: Mood normal.  Behavior: Behavior normal.     ED Results / Procedures / Treatments   Labs (all labs ordered are listed, but only abnormal results are displayed) Labs Reviewed - No data to display  EKG None  Radiology DG Knee Complete 4 Views Right  Result Date: 06/14/2023 CLINICAL DATA:  Right knee pain after fall EXAM: RIGHT KNEE - COMPLETE 4 VIEW COMPARISON:  None Available. FINDINGS: No evidence of fracture, dislocation, or joint effusion. Moderate tricompartment osteophytosis. Soft tissues are unremarkable. IMPRESSION: 1. No acute fracture or dislocation. 2. Moderate osteoarthritis. Electronically Signed   By: Jacob Moores M.D.   On: 06/14/2023 14:30    Procedures Procedures    Medications Ordered in ED Medications - No data to display  ED Course/ Medical Decision Making/ A&P                              Medical Decision Making Amount and/or Complexity of Data Reviewed Radiology: ordered.   42 year old female presents with right knee pain after fall.  X-rays of the right knee obtained and reviewed and interpreted independently, no evidence of fracture or other acute bony abnormality noted.  Provided patient with reassurance.  Discussed supportive care with rest, ice, compression, elevation and NSAIDs and Tylenol as needed for pain.  Discussed return precautions if not improving.  Discharged home in good condition.        Final Clinical Impression(s) / ED Diagnoses Final diagnoses:  Fall, initial encounter  Acute pain of right knee    Rx / DC Orders ED Discharge Orders     None         Legrand Rams 07/05/23 0732    Lorre Nick, MD 07/09/23 1444

## 2023-06-14 NOTE — Discharge Instructions (Addendum)
Your x-ray did not show a fracture. Use ibuprofen and ice for pain

## 2023-06-27 ENCOUNTER — Encounter: Payer: 59 | Attending: Internal Medicine | Admitting: Internal Medicine

## 2023-06-27 DIAGNOSIS — Z6841 Body Mass Index (BMI) 40.0 and over, adult: Secondary | ICD-10-CM | POA: Insufficient documentation

## 2023-06-27 DIAGNOSIS — I872 Venous insufficiency (chronic) (peripheral): Secondary | ICD-10-CM | POA: Diagnosis not present

## 2023-06-27 DIAGNOSIS — I87311 Chronic venous hypertension (idiopathic) with ulcer of right lower extremity: Secondary | ICD-10-CM | POA: Insufficient documentation

## 2023-06-27 DIAGNOSIS — L97812 Non-pressure chronic ulcer of other part of right lower leg with fat layer exposed: Secondary | ICD-10-CM | POA: Diagnosis not present

## 2023-06-27 DIAGNOSIS — I10 Essential (primary) hypertension: Secondary | ICD-10-CM | POA: Diagnosis not present

## 2023-06-27 DIAGNOSIS — I890 Lymphedema, not elsewhere classified: Secondary | ICD-10-CM | POA: Insufficient documentation

## 2023-06-29 NOTE — Progress Notes (Signed)
JONEISHA, DEMANN [name] ([id number]) [id number] Page 1 of 11 Visit Report for 06/27/2023 Chief Complaint Document Details Patient Name: Date of Service: [name], [name] 06/27/2023 10:30 A M Medical Record Number: [id number] Patient Account Number: [id number] Date of Birth/Sex: Treating RN: 1981-05-25 (42 y.o. [name] Primary Care Provider: PA Zenovia Jordan, [place] Other Clinician: Referring Provider: Treating Provider/Extender: Geralyn Corwin Self, Referral Weeks in Treatment: 0 Information Obtained from: Patient Chief Complaint 06/27/2023; right lower extremity wound Electronic Signature(s) Signed: 06/27/2023 12:18:12 PM By: Geralyn Corwin DO Entered By: Geralyn Corwin on 06/27/2023 12:10:02 -------------------------------------------------------------------------------- Debridement Details Patient Name: Date of Service: [name], [name] 06/27/2023 10:30 A M Medical Record Number: [id number] Patient Account Number: [id number] Date of Birth/Sex: Treating RN: Sep 23, 1981 (42 y.o. [name] Primary Care Provider: PA Zenovia Jordan, [place] Other Clinician: Referring Provider: Treating Provider/Extender: Geralyn Corwin Self, Referral Weeks in Treatment: 0 Debridement Performed for Assessment: Wound #7 Right,Lateral Lower Leg Performed By: Physician Geralyn Corwin, MD Debridement Type: Debridement Severity of Tissue Pre Debridement: Fat layer exposed Level of Consciousness (Pre-procedure): Awake and Alert Pre-procedure Verification/Time Out Yes - 11:44 Taken: Start Time: 11:44 Pain Control: Lidocaine 4% T opical Solution Percent of Wound Bed Debrided: 100% T Area Debrided (cm): otal 3.45 Tissue and other material debrided: Viable, Non-Viable, Slough, Subcutaneous, Slough Level: Skin/Subcutaneous Tissue Debridement Description: Excisional Instrument: Curette Bleeding: Minimum Hemostasis Achieved: Pressure Procedural Pain: 0 Post Procedural Pain: 0 [name], [name]  ([id number]) [id number] Page 2 of 11 Response to Treatment: Procedure was tolerated well Level of Consciousness (Post- Awake and Alert procedure): Post Debridement Measurements of Total Wound Length: (cm) 2 Width: (cm) 2.2 Depth: (cm) 0.3 Volume: (cm) 1.037 Character of Wound/Ulcer Post Debridement: Stable Severity of Tissue Post Debridement: Fat layer exposed Post Procedure Diagnosis Same as Pre-procedure Electronic Signature(s) Signed: 06/27/2023 12:18:12 PM By: Geralyn Corwin DO Signed: 06/29/2023 12:50:44 PM By: Midge Aver MSN RN CNS WTA Entered By: Midge Aver on 06/27/2023 11:45:37 -------------------------------------------------------------------------------- HPI Details Patient Name: Date of Service: [name], [name] 06/27/2023 10:30 A M Medical Record Number: [id number] Patient Account Number: [id number] Date of Birth/Sex: Treating RN: Jan 21, 1981 (42 y.o. [name] Primary Care Provider: PA Zenovia Jordan, NO Other Clinician: Referring Provider: Treating Provider/Extender: Geralyn Corwin Self, Referral Weeks in Treatment: 0 History of Present Illness HPI Description: 42 year old patient well known to our [facility] wound care clinic where she has been seen since 2016 for bilateral lower extremity venous insufficiency disease with lymphedema and multiple ulcerations associated with morbid obesity. she had custom-made compression stockings and lymphedema pumps which were used in the past. most recently she was admitted to the hospital between October 11 and 09/02/2017 with sepsis, lower extremity wounds and lymphedema.she was initially treated in the outpatient with Keflex and Bactrim. she was initially treated in the hospital with vancomycin and Zosyn and changed over to Unasyn until her white count improved and her blood cultures were negative for 3 days. After her inpatient management she was discharged home on Augmentin to end on 09/13/2017 with  a 14 day course. she has had outpatient vascular duplex scans completed in November 2017 and her right ABI was 1.1 and the left ABI is 1.3. she had normal toe brachial indices bilaterally.she had three-vessel runoff in the right lower extremity and two-vessel runoff in the left lower extremity. On questioning the patient she does have custom made compression stockings and also has a lymphedema pump but has not been using it appropriately and has not been taking good care  of herself. 09/17/2017 -- she returns today with compression stockings on the left side and the right side has had significant amount of drainage and has a very strong odor 09/24/2017 -- the drainage is increased significantly and she has more lymphedema and a very strong odor to her wound. Though she does not have systemic symptoms, or overt infection I believe she will benefit from some doxycycline given empirically. 10/01/2017 -- after starting the doxycycline and changing the dressing twice a week her symptoms and signs have definitely improved overall. 10/08/2017 -- she has completed her course of doxycycline but continues to have a lot of drainage and needs twice a week dressing changes. 11/08/17-she is here in follow-up evaluation for right lower extremity ulcers. She admits to using her lymphedema pumps twice daily, one hour per session. she is voicing no complaints or concerns, no signs of infection will change to [facility] 12/14/17 on evaluation today patient appears to be doing very well in regard to her wounds. She has been tolerating the dressing changes she continues to develop some portly the adherent granular tissue on the surface of the wound with some Slough. Obviously we are trying to get too much better wound bed. With that being said the hyper granulation the Hydrofera Blue Dressing to have helped with which is excellent news. However I think it may be time to try something a little bit different at this  point. 01/11/18 on evaluation today patient appears to be doing fairly well in regard to her right lateral lower extremity ulcers. This shows excellent signs of filling in which is great news. There does not appear to be any evidence of infection which is also good news. She does continue to work as well is good school. She is having no pain. [name], [name] ([id number]) [id number] Page 3 of 11 01/22/18 on evaluation today patient appears to be doing a little bit worse in my opinion in regard to the overall quality of that granulation on her right lower extremity. She was not here last week due to being sick with a stomach virus this may have something to do with the fact that her wound appears to be a little bit worse. With that being said I'm also thinking that after switching from the [facility] Dressing to the silver collagen would really has not looked that's good in my opinion. We may want to swit 02/11/18 on evaluation today patient's right lower/lateral lower extremity ulcers appear to be doing very well at this point. Especially the more proximal ulcer has filled in much closer to surface which is good news. Nonetheless both show signs of improvement which is great news. There does not appear to be any evidence of infection which is also good news. In general patient has been doing well tolerating the wraps as well as the [employer]. 02/18/18 on evaluation today patient appears to be doing a little bit worse in regard to the periwound region the wounds themselves do not look much deteriorated to me. With that being said she has several small blisters/pustules noted in the periwound and there was a significant amount of drainage and maceration compared to previous. There has been a time that we had to bring her back for twice a week dressing changes as far as her wrap was concerned it has been a while since we've done that however. With that being said the  patient has been having some burning and in general I'm concerned about the possibility of infection.  She has previously taken doxycycline with good result. Fortunately there does not appear to be any evidence of overall worsening in regard to the size of the wound and in fact the upper wound actually appears to be showing signs of good epithelialization. 03/18/18 on evaluation today patient appears to be doing excellent in regard to her right lateral lower extremity ulcer. She has been tolerating the dressing changes without complication. Fortunately this seems to be making great progress. Overall I see no signs of infection and there is dramatic improvement overall even compared to last week. 03/28/18 on evaluation today patient appears to be doing very well in regard to her right lateral lower surety ulcer. She has been tolerating the dressing changes without complication at this point. She states currently that she's having no significant discomfort which is excellent as well. Overall I'm pleased with how things seem to be progressing. 04/01/18 on evaluation today patient appears to be doing excellent in regard to her right lateral lower extremity ulcers. She has been tolerating the dressing changes without complication which is good news. With that being said the wraps still continues to show signs of helping with her fluid she does have Juxta-Lite compression stockings for when we are done with the current treatment regimen once everything heals. Mainly she just has the one area still remaining the smaller of the two wings is pretty much closed at this point there's just a very slight opening noted. 04/08/18 on evaluation today patient appears to be doing better in regard to the original wound on the right lateral lower extremity that we have been managing. Unfortunately she has a new area of weeping more anterior on the right lateral lower extremity and on the left lower extremity she has two new  ulcers there appears to be some cellulitis noted at this time. I am concerned about the fact that this may in fact be an infection that has caused the worsening and swelling in the past this has been the case when we previously attempted to determine what was going on when she had down slides like this. With that being said the patient is seeming to tolerate the wraps fairly well for the most part. 04/17/18; since last time the patient was seen in this clinic she was hospitalized from 04/11/18 through 04/13/18; she presented with bilateral lower extremity pain worse on the right and a fever of up to 104. Noteworthy that when she was in the clinic last week she had new wounds on the left leg culture grew MRSA and she was prescribed Bactrim. She had 2 days of IV Vanco and Zosyn in the hospital. Her blood cultures were negative. She was discharged on Bactrim to cover the original MRSA on the right leg and Keflex to cover the possibility of strep. The hospitalist had a conversation with infectious disease. The patient arrives in clinic today for nurse check however given the recent hospitalization I was asked to look at her. The patient states she feels a lot better. No fever or chills. Still some pain in the right calf but a lot better. She arrived in the hospital with a white count of 15.6, the next day was 10.8. Comprehensive metabolic panel was normal. She is still taking Keflex and Bactrim It would appear that she had a surgical IandD at the bedside of the right calf felt to have a underlying abscess. According to our intake nurse the wound has expanded quite bit on the right lateral calf. She has no  open area on the left anterior and left posterior calf as described last time 04/23/18 on evaluation today patient actually appears to be doing much better than when I last saw her. This obviously has been a couple weeks ago and in the interim she was admitted to the hospital for IV antibiotic therapy due to  cellulitis, discharge, and fortunately the substance which hadn't sued has completely resolved. Her swelling seems to be better in regard to her lower extremities as well and the wounds that opened up as results of the infection seems to be showing signs of improvement. There is some Slough noted on the left lateral wound otherwise a lot of weeping noted of the right lateral leg. 04/29/18 on evaluation today patient appears to be doing excellent in regard to her bilateral lower extremity ulcers. She's been tolerating the dressing changes without complication there does not appear to be any evidence of infection at this time. Overall I think she is made great improvement and seems to be showing signs of coming back around where she was prior to the cellulitis and sepsis episode. 05/06/18 on evaluation today patient appears to be doing better in regard to her bilateral ocean the ulcers. She's been tolerating the dressing changes without complication. Fortunately there does not appear to be any evidence of infection at this time. Her swelling is significantly down overall seems to be showing signs of good improvement as well. Fortunately I do believe that she is progressing nicely back to where she was prior to the cellulitis/sepsis issue. 05/13/18 on evaluation today patient seems to be showing signs of great improvement she's been tolerating the dressing changes without complication and overall there does not appear to be any evidence of infection. All of which is good news. The only issue she really have today is that some of the [facility] Dressing actually was stuck to the periwound of the right lateral lower extremity however this was able to be removed during the debridement without complication. 05/21/18 on evaluation today patient appears to be doing very well in regard to her bilateral lower extremity ulcers. She has been tolerating the compression wraps without complication. There does not  appear to be any evidence of infection at this point which is good news as well. Overall I'm very pleased with the progress that has been made. She likewise is very happy. 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 05/28/18 on evaluation today patient actually appears to be doing excellent in regard to her bilateral lower [place] ulcers. She's made great progress since last week and overall I'm very pleased in this regard. She states that she's not have any discomfort either which is also good news there's definitely no evidence of infection. 06/03/18 on evaluation today patient actually appears to be doing excellent in regard to her ulcerations in fact these areas on both lower extremities were almost completely healed. Unfortunately she has a situation going on her life right now she actually found her husband deceased last August 10, 2023. She states that she has been quite a bit upset since that time unfortunately this is obviously a very big blow to her. Nonetheless she is concerned about the funeral and having to wear certain shoes for this that she states she cannot fit in with the wraps. She wonders if she could potentially come back on 2023-08-10 to have her wraps changed and to switch into her  Juxta-Lite compression at that point. I think that would be an okay thing that we could do for her. Nonetheless she fortunately seems to be tolerating the wraps very well and again has made excellent progress with the [facility] Dressing 06/11/18 on evaluation today patient appears to be doing rather well in regard to her bilateral lower extremities in fact everything appears to be completely healed at this point which is excellent news. There does not appear to be any evidence of infection at this time which is great. In fact overall I do believe she is completely resolved in regard to her bilateral  lower extremity ulcers. She is extremely happy in this regard. 06/27/2023 Ms. Kalea Whitis is a 42 year old female with a past medical history of venous insufficiency and lymphedema that presents to the clinic for a 3-week history of nonhealing wound to the right lower extremity. She states this started spontaneously. She does not wear compression stockings. She has been keeping the area covered. She currently denies signs of infection. Electronic Signature(s) [place], Lance [name] ([id number]) [id number] Page 4 of 11 Signed: 06/27/2023 12:18:12 PM By: Geralyn Corwin DO Entered By: Geralyn Corwin on 06/27/2023 12:10:47 -------------------------------------------------------------------------------- Physical Exam Details Patient Name: Date of Service: [name], Vernal [name]. 06/27/2023 10:30 A M Medical Record Number: [id number] Patient Account Number: [id number] Date of Birth/Sex: Treating RN: Sep 04, 1981 (42 y.o. [name] Primary Care Provider: PA Zenovia Jordan, [place] Other Clinician: Referring Provider: Treating Provider/Extender: Geralyn Corwin Self, Referral Weeks in Treatment: 0 Constitutional . Cardiovascular . Psychiatric . Notes Right lower extremity: T the distal lateral aspect there is an open wound with nonviable tissue and granulation tissue. No signs of infection including increased o warmth, erythema or purulent drainage. 2+ pitting edema to the knee. Venous stasis dermatitis noted throughout the leg. Electronic Signature(s) Signed: 06/27/2023 12:18:12 PM By: Geralyn Corwin DO Entered By: Geralyn Corwin on 06/27/2023 12:11:23 -------------------------------------------------------------------------------- Physician Orders Details Patient Name: Date of Service: [name], Blossie [name]. 06/27/2023 10:30 A M Medical Record Number: [id number] Patient Account Number: [id number] Date of Birth/Sex: Treating RN: 1981/11/20 (42 y.o. [name] Primary Care Provider: PA  TIENT, NO Other Clinician: Referring Provider: Treating Provider/Extender: Geralyn Corwin Self, Referral Weeks in Treatment: 0 Verbal / Phone Orders: No Diagnosis Coding ICD-10 Coding Code Description [phone] Non-pressure chronic ulcer of other part of right lower leg with fat layer exposed I87.311 Chronic venous hypertension (idiopathic) with ulcer of right lower extremity I89.0 Lymphedema, not elsewhere classified AUDREANNA, ROSEBUSH [name] ([id number]) [id number] Page 5 of 11 Follow-up Appointments Return Appointment in 1 week. Bathing/ Shower/ Hygiene May shower with wound dressing protected with water repellent cover or cast protector. No tub bath. Anesthetic (Use 'Patient Medications' Section for Anesthetic Order Entry) Lidocaine applied to wound bed Edema Control - Lymphedema / Segmental Compressive Device / Other UrgoK2 - Large Wound Treatment Wound #7 - Lower Leg Wound Laterality: Right, Lateral Cleanser: Soap and Water 1 x Per Week/30 Days Discharge Instructions: Gently cleanse wound with antibacterial soap, rinse and pat dry prior to dressing wounds Cleanser: Vashe 5.8 (oz) 1 x Per Week/30 Days Discharge Instructions: Use vashe 5.8 (oz) as directed Topical: Gentamicin 1 x Per Week/30 Days Discharge Instructions: Apply as directed by provider. Topical: Mupirocin Ointment 1 x Per Week/30 Days Discharge Instructions: Apply as directed by provider. Prim Dressing: Hydrofera Blue Ready Transfer Foam, 2.5x2.5 (in/in) 1 x Per Week/30 Days ary Discharge Instructions: Apply Hydrofera Blue Ready to wound bed as directed Secondary Dressing: ABD Pad 5x9 (  in/in) 1 x Per Week/30 Days Discharge Instructions: Cover with ABD pad Compression Wrap: Urgo K2 Lite, two layer compression system, large 1 x Per Week/30 Days Services and Therapies Ankle Brachial Index (ABI) Electronic Signature(s) Signed: 06/27/2023 3:27:50 PM By: Geralyn Corwin DO Signed: 06/29/2023  12:50:44 PM By: Midge Aver MSN RN CNS WTA Previous Signature: 06/27/2023 12:18:12 PM Version By: Geralyn Corwin DO Entered By: Midge Aver on 06/27/2023 14:18:35 -------------------------------------------------------------------------------- Problem List Details Patient Name: Date of Service: [name], Jazsmine [name]. 06/27/2023 10:30 A M Medical Record Number: [id number] Patient Account Number: [id number] Date of Birth/Sex: Treating RN: [birth year] (42 y.o. [name] Primary Care Provider: PA Zenovia Jordan, [place] Other Clinician: Referring Provider: Treating Provider/Extender: Geralyn Corwin Self, Referral Weeks in Treatment: 0 Active Problems ICD-10 Encounter Code Description Active Date MDM Diagnosis L97.812 Non-pressure chronic ulcer of other part of right lower leg with fat layer 06/27/2023 No Yes [place], Breigh [name] ([id number]) [id number] Page 6 of 11 exposed I87.311 Chronic venous hypertension (idiopathic) with ulcer of right lower extremity 06/27/2023 No Yes I89.0 Lymphedema, not elsewhere classified 06/27/2023 No Yes Inactive Problems Resolved Problems Electronic Signature(s) Signed: 06/27/2023 2:02:00 PM By: Midge Aver MSN RN CNS WTA Signed: 06/27/2023 2:10:16 PM By: Geralyn Corwin DO Previous Signature: 06/27/2023 12:18:12 PM Version By: Geralyn Corwin DO Entered By: Midge Aver on 06/27/2023 14:02:00 -------------------------------------------------------------------------------- Progress Note Details Patient Name: Date of Service: [name], Mystique [name]. 06/27/2023 10:30 A M Medical Record Number: [id number] Patient Account Number: [id number] Date of Birth/Sex: Treating RN: 08-Sep-1981 (42 y.o. [name] Primary Care Provider: PA TIENT, NO Other Clinician: Referring Provider: Treating Provider/Extender: Geralyn Corwin Self, Referral Weeks in Treatment: 0 Subjective Chief Complaint Information obtained from Patient 06/27/2023; right lower extremity  wound History of Present Illness (HPI) 42 year old patient well known to our [facility] wound care clinic where she has been seen since 2016 for bilateral lower extremity venous insufficiency disease with lymphedema and multiple ulcerations associated with morbid obesity. she had custom-made compression stockings and lymphedema pumps which were used in the past. most recently she was admitted to the hospital between October 11 and 09/02/2017 with sepsis, lower extremity wounds and lymphedema.she was initially treated in the outpatient with Keflex and Bactrim. she was initially treated in the hospital with vancomycin and Zosyn and changed over to Unasyn until her white count improved and her blood cultures were negative for 3 days. After her inpatient management she was discharged home on Augmentin to end on 09/13/2017 with a 14 day course. she has had outpatient vascular duplex scans completed in November 2017 and her right ABI was 1.1 and the left ABI is 1.3. she had normal toe brachial indices bilaterally.she had three-vessel runoff in the right lower extremity and two-vessel runoff in the left lower extremity. On questioning the patient she does have custom made compression stockings and also has a lymphedema pump but has not been using it appropriately and has not been taking good care of herself. 09/17/2017 -- she returns today with compression stockings on the left side and the right side has had significant amount of drainage and has a very strong odor 09/24/2017 -- the drainage is increased significantly and she has more lymphedema and a very strong odor to her wound. Though she does not have systemic symptoms, or overt infection I believe she will benefit from some doxycycline given empirically. 10/01/2017 -- after starting the doxycycline and changing the dressing twice a week her symptoms and signs have definitely improved overall. 10/08/2017 --  she has completed her course of  doxycycline but continues to have a lot of drainage and needs twice a week dressing changes. 11/08/17-she is here in follow-up evaluation for right lower extremity ulcers. She admits to using her lymphedema pumps twice daily, one hour per session. she is voicing no complaints or concerns, no signs of infection will change to [facility] 12/14/17 on evaluation today patient appears to be doing very well in regard to her wounds. She has been tolerating the dressing changes she continues to develop some portly the adherent granular tissue on the surface of the wound with some Slough. Obviously we are trying to get too much better wound bed. [name], [name] ([id number]) [id number] Page 7 of 11 With that being said the hyper granulation the Hydrofera Blue Dressing to have helped with which is excellent news. However I think it may be time to try something a little bit different at this point. 01/11/18 on evaluation today patient appears to be doing fairly well in regard to her right lateral lower extremity ulcers. This shows excellent signs of filling in which is great news. There does not appear to be any evidence of infection which is also good news. She does continue to work as well is good school. She is having no pain. 01/22/18 on evaluation today patient appears to be doing a little bit worse in my opinion in regard to the overall quality of that granulation on her right lower extremity. She was not here last week due to being sick with a stomach virus this may have something to do with the fact that her wound appears to be a little bit worse. With that being said I'm also thinking that after switching from the [facility] Dressing to the silver collagen would really has not looked that's good in my opinion. We may want to swit 02/11/18 on evaluation today patient's right lower/lateral lower extremity ulcers appear to be doing very well at this point. Especially the more proximal  ulcer has filled in much closer to surface which is good news. Nonetheless both show signs of improvement which is great news. There does not appear to be any evidence of infection which is also good news. In general patient has been doing well tolerating the wraps as well as the [employer]. 02/18/18 on evaluation today patient appears to be doing a little bit worse in regard to the periwound region the wounds themselves do not look much deteriorated to me. With that being said she has several small blisters/pustules noted in the periwound and there was a significant amount of drainage and maceration compared to previous. There has been a time that we had to bring her back for twice a week dressing changes as far as her wrap was concerned it has been a while since we've done that however. With that being said the patient has been having some burning and in general I'm concerned about the possibility of infection. She has previously taken doxycycline with good result. Fortunately there does not appear to be any evidence of overall worsening in regard to the size of the wound and in fact the upper wound actually appears to be showing signs of good epithelialization. 03/18/18 on evaluation today patient appears to be doing excellent in regard to her right lateral lower extremity ulcer. She has been tolerating the dressing changes without complication. Fortunately this seems to be making great progress. Overall I see no signs of infection and there is dramatic improvement overall even  compared to last week. 03/28/18 on evaluation today patient appears to be doing very well in regard to her right lateral lower surety ulcer. She has been tolerating the dressing changes without complication at this point. She states currently that she's having no significant discomfort which is excellent as well. Overall I'm pleased with how things seem to be progressing. 04/01/18 on evaluation today patient appears  to be doing excellent in regard to her right lateral lower extremity ulcers. She has been tolerating the dressing changes without complication which is good news. With that being said the wraps still continues to show signs of helping with her fluid she does have Juxta-Lite compression stockings for when we are done with the current treatment regimen once everything heals. Mainly she just has the one area still remaining the smaller of the two wings is pretty much closed at this point there's just a very slight opening noted. 04/08/18 on evaluation today patient appears to be doing better in regard to the original wound on the right lateral lower extremity that we have been managing. Unfortunately she has a new area of weeping more anterior on the right lateral lower extremity and on the left lower extremity she has two new ulcers there appears to be some cellulitis noted at this time. I am concerned about the fact that this may in fact be an infection that has caused the worsening and swelling in the past this has been the case when we previously attempted to determine what was going on when she had down slides like this. With that being said the patient is seeming to tolerate the wraps fairly well for the most part. 04/17/18; since last time the patient was seen in this clinic she was hospitalized from 04/11/18 through 04/13/18; she presented with bilateral lower extremity pain worse on the right and a fever of up to 104. Noteworthy that when she was in the clinic last week she had new wounds on the left leg culture grew MRSA and she was prescribed Bactrim. She had 2 days of IV Vanco and Zosyn in the hospital. Her blood cultures were negative. She was discharged on Bactrim to cover the original MRSA on the right leg and Keflex to cover the possibility of strep. The hospitalist had a conversation with infectious disease. The patient arrives in clinic today for nurse check however given the recent  hospitalization I was asked to look at her. The patient states she feels a lot better. No fever or chills. Still some pain in the right calf but a lot better. She arrived in the hospital with a white count of 15.6, the next day was 10.8. Comprehensive metabolic panel was normal. She is still taking Keflex and Bactrim It would appear that she had a surgical IandD at the bedside of the right calf felt to have a underlying abscess. According to our intake nurse the wound has expanded quite bit on the right lateral calf. She has no open area on the left anterior and left posterior calf as described last time 04/23/18 on evaluation today patient actually appears to be doing much better than when I last saw her. This obviously has been a couple weeks ago and in the interim she was admitted to the hospital for IV antibiotic therapy due to cellulitis, discharge, and fortunately the substance which hadn't sued has completely resolved. Her swelling seems to be better in regard to her lower extremities as well and the wounds that opened up as results of the  infection seems to be showing signs of improvement. There is some Slough noted on the left lateral wound otherwise a lot of weeping noted of the right lateral leg. 04/29/18 on evaluation today patient appears to be doing excellent in regard to her bilateral lower extremity ulcers. She's been tolerating the dressing changes without complication there does not appear to be any evidence of infection at this time. Overall I think she is made great improvement and seems to be showing signs of coming back around where she was prior to the cellulitis and sepsis episode. 05/06/18 on evaluation today patient appears to be doing better in regard to her bilateral ocean the ulcers. She's been tolerating the dressing changes without complication. Fortunately there does not appear to be any evidence of infection at this time. Her swelling is significantly down overall seems to  be showing signs of good improvement as well. Fortunately I do believe that she is progressing nicely back to where she was prior to the cellulitis/sepsis issue. 05/13/18 on evaluation today patient seems to be showing signs of great improvement she's been tolerating the dressing changes without complication and overall there does not appear to be any evidence of infection. All of which is good news. The only issue she really have today is that some of the [facility] Dressing actually was stuck to the periwound of the right lateral lower extremity however this was able to be removed during the debridement without complication. 05/21/18 on evaluation today patient appears to be doing very well in regard to her bilateral lower extremity ulcers. She has been tolerating the compression wraps without complication. There does not appear to be any evidence of infection at this point which is good news as well. Overall I'm very pleased with the progress that has been made. She likewise is very happy. 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 05/28/18 on evaluation today patient actually appears to be doing excellent in regard to her bilateral lower [place] ulcers. She's made great progress since last week and overall I'm very pleased in this regard. She states that she's not have any discomfort either which is also good news there's definitely no evidence of infection. 06/03/18 on evaluation today patient actually appears to be doing excellent in regard to her ulcerations in fact these areas on both lower extremities were almost completely healed. Unfortunately she has a situation going on her life right now she actually found her husband deceased last 07-25-23. She states that she has been quite a bit upset since that time unfortunately this is obviously a very big blow to her. Nonetheless she is  concerned about the funeral and having to wear certain shoes for this that she states she cannot fit in with the wraps. She wonders if she could potentially come back on 07-25-23 to have her wraps changed and to switch into her Juxta-Lite compression at that point. I think that would be an okay thing that we could do for her. Nonetheless she fortunately seems to be tolerating the wraps very well and again has made excellent progress with the [facility] Dressing 06/11/18 on evaluation today patient appears to be doing rather well in regard to her bilateral lower extremities in fact everything appears to be completely healed at this point which is excellent news. There does not appear to be any evidence of infection at this time which is great. In fact  overall I do believe she is completely resolved in regard to her bilateral lower extremity ulcers. She is extremely happy in this regard. [name], [name] ([id number]) [id number] Page 8 of 11 06/27/2023 Ms. [name] is a 42 year old female with a past medical history of venous insufficiency and lymphedema that presents to the clinic for a 3-week history of nonhealing wound to the right lower extremity. She states this started spontaneously. She does not wear compression stockings. She has been keeping the area covered. She currently denies signs of infection. Patient History Information obtained from Patient. Allergies Bactrim (Severity: Mild, Reaction: itching), sulfa (Severity: Mild, Reaction: itching) Family History Kidney Disease - Mother, No family history of Cancer, Diabetes, Heart Disease, Hereditary Spherocytosis, Hypertension, Lung Disease, Seizures, Stroke, Thyroid Problems, Tuberculosis. Social History Never smoker, Marital Status - Widowed, Alcohol Use - Never, Drug Use - No History, Caffeine Use - Daily. Medical History Hematologic/Lymphatic Patient has history of Lymphedema Respiratory Denies history of  Aspiration, Asthma, Chronic Obstructive Pulmonary Disease (COPD), Pneumothorax, Sleep Apnea, Tuberculosis Cardiovascular Patient has history of Hypertension Denies history of Angina, Arrhythmia, Congestive Heart Failure, Coronary Artery Disease, Deep Vein Thrombosis, Hypotension, Myocardial Infarction, Peripheral Arterial Disease, Peripheral Venous Disease, Phlebitis, Vasculitis Endocrine Denies history of Type I Diabetes, Type II Diabetes Review of Systems (ROS) Endocrine Denies complaints or symptoms of Hepatitis, Thyroid disease, Polydypsia (Excessive Thirst). Genitourinary Denies complaints or symptoms of Kidney failure/ Dialysis, Incontinence/dribbling. Immunological Denies complaints or symptoms of Hives, Itching. Integumentary (Skin) Denies complaints or symptoms of Wounds, Bleeding or bruising tendency, Breakdown, Swelling. Musculoskeletal Denies complaints or symptoms of Muscle Pain, Muscle Weakness. Neurologic Denies complaints or symptoms of Numbness/parasthesias, Focal/Weakness. Psychiatric Denies complaints or symptoms of Anxiety, Claustrophobia. Objective Constitutional Vitals Time Taken: 11:01 AM, Height: 73 in, Source: Stated, Weight: 436 lbs, Source: Stated, BMI: 57.5, Temperature: 98.1 F, Pulse: 91 bpm, Respiratory Rate: 18 breaths/min, Blood Pressure: 122/93 mmHg. General Notes: Right lower extremity: T the distal lateral aspect there is an open wound with nonviable tissue and granulation tissue. No signs of infection o including increased warmth, erythema or purulent drainage. 2+ pitting edema to the knee. Venous stasis dermatitis noted throughout the leg. Integumentary (Hair, Skin) Wound #7 status is Open. Original cause of wound was Gradually Appeared. The date acquired was: 06/06/2023. The wound is located on the Right,Lateral Lower Leg. The wound measures 2cm length x 2.2cm width x 0.3cm depth; 3.456cm^2 area and 1.037cm^3 volume. There is Fat Layer (Subcutaneous  Tissue) exposed. There is a large amount of purulent drainage noted. There is small (1-33%) red, pink granulation within the wound bed. There is a medium (34-66%) amount of necrotic tissue within the wound bed including Adherent Slough. 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 I debrided nonviable tissue. At this time I recommended antibiotic ointment and Hydrofera Blue to the wound bed. We discussed the importance of edema control for her wound healing. Her ABIs in office was 0.74 on the right however she has palpable pedal pulses and should have adequate blood flow for healing. Significant swelling may be skewing results. We will obtain formal ABIs with or without TBI's. For now she should do fine with 3 layer compression. She knows to call  with any questions or concerns. She knows to not keep the wrap on for more than 7 days or get it wet. Follow-up in 1 week. Procedures Wound #7 Pre-procedure diagnosis of Wound #7 is a Venous Leg Ulcer located on the Right,Lateral Lower Leg .Severity of Tissue Pre Debridement is: Fat layer exposed. There was a Excisional Skin/Subcutaneous Tissue Debridement with a total area of 3.45 sq cm performed by Geralyn Corwin, MD. With the following instrument(s): Curette to remove Viable and Non-Viable tissue/material. Material removed includes Subcutaneous Tissue and Slough and after achieving pain control using Lidocaine 4% T opical Solution. No specimens were taken. A time out was conducted at 11:44, prior to the start of the procedure. A Minimum amount of bleeding  was controlled with Pressure. The procedure was tolerated well with a pain level of 0 throughout and a pain level of 0 following the procedure. Post Debridement Measurements: 2cm length x 2.2cm width x 0.3cm depth; 1.037cm^3 volume. Character of Wound/Ulcer Post Debridement is stable. Severity of Tissue Post Debridement is: Fat layer exposed. Post procedure Diagnosis Wound #7: Same as Pre-Procedure Pre-procedure diagnosis of Wound #7 is a Venous Leg Ulcer located on the Right,Lateral Lower Leg . There was a Three Layer Compression Therapy Procedure by Midge Aver, RN. Post procedure Diagnosis Wound #7: Same as Pre-Procedure Plan Follow-up Appointments: Return Appointment in 1 week. Bathing/ Shower/ Hygiene: May shower with wound dressing protected with water repellent cover or cast protector. No tub bath. Anesthetic (Use 'Patient Medications' Section for Anesthetic Order Entry): Lidocaine applied to wound bed Edema Control - Lymphedema / Segmental Compressive Device / Other: UrgoK2 - Large WOUND #7: - Lower Leg Wound Laterality: Right, Lateral Cleanser: Soap and Water 1 x Per Week/30 Days Discharge Instructions: Gently cleanse wound with antibacterial soap, rinse and pat dry prior to dressing wounds Cleanser: Vashe 5.8 (oz) 1 x Per Week/30 Days Discharge Instructions: Use vashe 5.8 (oz) as directed Topical: Gentamicin 1 x Per Week/30 Days Discharge Instructions: Apply as directed by provider. Topical: Mupirocin Ointment 1 x Per Week/30 Days Discharge Instructions: Apply as directed by provider. Prim Dressing: Hydrofera Blue Ready Transfer Foam, 2.5x2.5 (in/in) 1 x Per Week/30 Days ary Discharge Instructions: Apply Hydrofera Blue Ready to wound bed as directed Secondary Dressing: ABD Pad 5x9 (in/in) 1 x Per Week/30 Days Discharge Instructions: Cover with ABD pad Com pression Wrap: Urgo K2 Lite, two layer compression system, large 1 x Per Week/30 Days 1. In office sharp  debridement 2. Hydrofera Blue and antibiotic ointment under 3 layer compression to the right lower extremity 3. Follow-up in 1 week 4. Formal ABIs with/without TBI's 5. Ordered juxta lite compression Electronic Signature(s) Signed: 06/27/2023 12:18:12 PM By: Geralyn Corwin DO Entered By: Geralyn Corwin on 06/27/2023 12:17:29 [name] [name] ([id number]) [id number] Page 10 of 11 -------------------------------------------------------------------------------- ROS/PFSH Details Patient Name: Date of Service: [name], [name] 06/27/2023 10:30 A M Medical Record Number: [id number] Patient Account Number: [id number] Date of Birth/Sex: Treating RN: 11-Jul-1981 (42 y.o. [name] Primary Care Provider: PA Zenovia Jordan, NO Other Clinician: Referring Provider: Treating Provider/Extender: Geralyn Corwin Self, Referral Weeks in Treatment: 0 Information Obtained From Patient Endocrine Complaints and Symptoms: Negative for: Hepatitis; Thyroid disease; Polydypsia (Excessive Thirst) Medical History: Negative for: Type I Diabetes; Type II Diabetes Genitourinary Complaints and Symptoms: Negative for: Kidney failure/ Dialysis; Incontinence/dribbling Immunological Complaints and Symptoms: Negative for: Hives; Itching Integumentary (Skin) Complaints and Symptoms: Negative for: Wounds; Bleeding or bruising tendency; Breakdown; Swelling Musculoskeletal Complaints and Symptoms: Negative  for: Muscle Pain; Muscle Weakness Neurologic Complaints and Symptoms: Negative for: Numbness/parasthesias; Focal/Weakness Psychiatric Complaints and Symptoms: Negative for: Anxiety; Claustrophobia Hematologic/Lymphatic Medical History: Positive for: Lymphedema Respiratory Medical History: Negative for: Aspiration; Asthma; Chronic Obstructive Pulmonary Disease (COPD); Pneumothorax; Sleep Apnea; Tuberculosis Cardiovascular Medical History: Positive for: Hypertension Negative for:  Angina; Arrhythmia; Congestive Heart Failure; Coronary Artery Disease; Deep Vein Thrombosis; Hypotension; Myocardial Infarction; Peripheral Arterial Disease; Peripheral Venous Disease; Phlebitis; Vasculitis Oncologic Immunizations Pneumococcal Vaccine: Received Pneumococcal Vaccination: No Immunization Notes: up to date Implantable Devices Dahlgren, Dora [name] ([id number]) [id number] Page 11 of 11 None Family and Social History Cancer: No; Diabetes: No; Heart Disease: No; Hereditary Spherocytosis: No; Hypertension: No; Kidney Disease: Yes - Mother; Lung Disease: No; Seizures: No; Stroke: No; Thyroid Problems: No; Tuberculosis: No; Never smoker; Marital Status - Widowed; Alcohol Use: Never; Drug Use: No History; Caffeine Use: Daily; Financial Concerns: No; Food, Clothing or Shelter Needs: No; Support System Lacking: No; Transportation Concerns: No Electronic Signature(s) Signed: 06/27/2023 12:18:12 PM By: Geralyn Corwin DO Signed: 06/29/2023 12:50:44 PM By: Midge Aver MSN RN CNS WTA Entered By: Midge Aver on 06/27/2023 11:08:09 -------------------------------------------------------------------------------- SuperBill Details Patient Name: Date of Service: [name], Lethia [name]. 06/27/2023 Medical Record Number: [id number] Patient Account Number: [id number] Date of Birth/Sex: Treating RN: 1981-04-18 (42 y.o. [name] Primary Care Provider: PA Zenovia Jordan, NO Other Clinician: Referring Provider: Treating Provider/Extender: Geralyn Corwin Self, Referral Weeks in Treatment: 0 Diagnosis Coding ICD-10 Codes Code Description 2531839835 Non-pressure chronic ulcer of other part of right lower leg with fat layer exposed I87.311 Chronic venous hypertension (idiopathic) with ulcer of right lower extremity I89.0 Lymphedema, not elsewhere classified Facility Procedures : CPT4 Code: 41660630 Description: 99213 - WOUND CARE VISIT-LEV 3 EST PT Modifier: Quantity: 1 : CPT4  Code: [id number] Description: 11042 - DEB SUBQ TISSUE 20 SQ CM/< ICD-10 Diagnosis Description L97.812 Non-pressure chronic ulcer of other part of right lower leg with fat layer expos I87.311 Chronic venous hypertension (idiopathic) with ulcer of right lower extremity Modifier: ed Quantity: 1 Physician Procedures : CPT4 Code Description Modifier [id number] 99204 - WC PHYS LEVEL 4 - NEW PT 25 ICD-10 Diagnosis Description L97.812 Non-pressure chronic ulcer of other part of right lower leg with fat layer exposed I87.311 Chronic venous hypertension (idiopathic) with  ulcer of right lower extremity I89.0 Lymphedema, not elsewhere classified Quantity: 1 : [id number] 11042 - WC PHYS SUBQ TISS 20 SQ CM ICD-10 Diagnosis Description L97.812 Non-pressure chronic ulcer of other part of right lower leg with fat layer exposed I87.311 Chronic venous hypertension (idiopathic) with ulcer of right lower extremity Quantity: 1 Electronic Signature(s) Signed: 06/27/2023 12:18:12 PM By: Geralyn Corwin DO Entered By: Geralyn Corwin on 06/27/2023 12:17:44

## 2023-06-29 NOTE — Progress Notes (Signed)
[name], [name] ([id number]) [phone] [email or web address] Page 1 of 5 Visit Report for 06/27/2023 Abuse Risk Screen Details Patient Name: Date of Service: [name], [name] 06/27/2023 10:30 A M Medical Record Number: [id number] Patient Account Number: [id number] Date of Birth/Sex: Treating RN: [birth year] (42 y.o. [name] Primary Care [name]: PA [name], [place] Other Clinician: Referring [name]: Treating [name]/Extender: [name] Self, Referral [name]: 0 Abuse Risk Screen Items Answer Electronic Signature(s) Signed: 06/29/2023 12:50:44 PM By: Jessica Aver MSN RN CNS WTA Entered By: [name] on 06/27/2023 11:08:15 -------------------------------------------------------------------------------- Activities of Daily Living Details Patient Name: Date of Service: [name], [name] 06/27/2023 10:30 A M Medical Record Number: [id number] Patient Account Number: [id number] Date of Birth/Sex: Treating RN: 1981/10/04 (42 y.o. [name] Primary Care Champayne Kocian: PA [name], NO Other Clinician: Referring [name]: Treating [name]/Extender: [name] Self, Referral [name]: 0 Activities of Daily Living Items Answer Activities of Daily Living (Please select one for each item) Drive Automobile Completely Able T Medications ake Completely Able Use T elephone Completely Able Care for Appearance Completely Able Use T oilet Completely Able Bath / Shower Completely Able Dress Self Completely Able Feed Self Completely Able Walk Completely Able Get In / Out Bed Completely Able Housework Completely Able Prepare Meals Completely Able Handle Money Completely Able Shop for Self Completely [name], [name] ([id number]) [phone] [email or web address] Page 2 of 5 Electronic Signature(s) Signed: 06/29/2023 12:50:44 PM By: Jessica Aver MSN RN CNS WTA Entered By: [name] on 06/27/2023  11:08:30 -------------------------------------------------------------------------------- Education Screening Details Patient Name: Date of Service: [name], [name] 06/27/2023 10:30 A M Medical Record Number: [id number] Patient Account Number: [id number] Date of Birth/Sex: Treating RN: [birth year] (42 y.o. [name] Primary Care Satrina Magallanes: PA [name], NO Other Clinician: Referring [name]: Treating [name]/Extender: [name] Self, Referral [name]: 0 Learning Preferences/Education Level/Primary Language Learning Preference: Explanation, Demonstration Highest Education Level: College or Above Preferred Language: English Cognitive Barrier Language Barrier: No Translator Needed: No Memory Deficit: No Emotional Barrier: No Cultural/Religious Beliefs Affecting Medical Care: No Physical Barrier Impaired Vision: No Impaired Hearing: No Decreased Hand dexterity: No Knowledge/Comprehension Knowledge Level: High Comprehension Level: High Ability to understand written instructions: High Ability to understand verbal instructions: High Motivation Anxiety Level: Calm Cooperation: Cooperative Education Importance: Acknowledges Need Interest in Health Problems: Asks Questions Perception: Coherent Willingness to Engage in Self-Management High Activities: Readiness to Engage in Self-Management High Activities: Electronic Signature(s) Signed: 06/29/2023 12:50:44 PM By: Jessica Aver MSN RN CNS WTA Entered By: [name] on 06/27/2023 11:08:59 [name] ([id number]) [id number] [id number] Page 3 of 5 -------------------------------------------------------------------------------- Fall Risk Assessment Details Patient Name: Date of Service: [name], [name] 06/27/2023 10:30 A M Medical Record Number: [id number] Patient Account Number: [id number] Date of Birth/Sex: Treating RN: 1981-09-03 (42 y.o. [name] Primary Care [name]: PA [name],  NO Other Clinician: Referring [name]: Treating [name]/Extender: [name] Self, Referral [name]: 0 Fall Risk Assessment Items Have you had 2 or more falls in the last 12 monthso 0 Yes Have you had any fall that resulted in injury in the last 12 monthso 0 No FALLS RISK SCREEN History of falling - immediate or within 3 months 25 Yes Secondary diagnosis (Do you have 2 or more medical diagnoseso) 0 No Ambulatory aid None/bed rest/wheelchair/nurse 0 No Crutches/cane/walker 0 No Furniture 0 No Intravenous therapy Access/Saline/Heparin Lock 0 No Gait/Transferring Normal/ bed rest/ wheelchair 0 No Weak (short steps with or without shuffle, stooped  but able to lift head while walking, may seek 0 No support from furniture) Impaired (short steps with shuffle, may have difficulty arising from chair, head down, impaired 0 No balance) Mental Status Oriented to own ability 0 Yes Electronic Signature(s) Signed: 06/29/2023 12:50:44 PM By: Jessica Aver MSN RN CNS WTA Entered By: [name] on 06/27/2023 11:09:31 -------------------------------------------------------------------------------- Foot Assessment Details Patient Name: Date of Service: [name], [name] 06/27/2023 10:30 A M Medical Record Number: [id number] Patient Account Number: [id number] Date of Birth/Sex: Treating RN: 26-May-1981 (42 y.o. [name] Primary Care [name]: PA [name], [place] Other Clinician: Referring [name]: Treating [name]/Extender: [name] Self, Referral [name]: 0 Foot Assessment Items Site Locations [place], [place] [name] ([id number]) [phone] [email or web address] Page 4 of 5 + = Sensation present, - = Sensation absent, C = Callus, U = Ulcer [name] = Redness, W = Warmth, M = Maceration, PU = Pre-ulcerative lesion F = Fissure, S = Swelling, D = Dryness Assessment Right: Left: Other Deformity: No No Prior Foot Ulcer: No No Prior Amputation: No No Charcot  Joint: No No Ambulatory Status: Ambulatory Without Help Gait: Steady Electronic Signature(s) Signed: 06/29/2023 12:50:44 PM By: Jessica Aver MSN RN CNS WTA Entered By: [name] on 06/27/2023 11:11:16 -------------------------------------------------------------------------------- Nutrition Risk Screening Details Patient Name: Date of Service: [name], [name] 06/27/2023 10:30 A M Medical Record Number: [id number] Patient Account Number: [id number] Date of Birth/Sex: Treating RN: [birth year] (42 y.o. [name] Primary Care Jhamari Markowicz: PA [name], NO Other Clinician: Referring Media Pizzini: Treating Raylyn Speckman/Extender: [name] Self, Referral [name]: 0 Height (in): 73 Weight (lbs): 436 Body Mass Index (BMI): 57.5 Nutrition Risk Screening Items Score Screening NUTRITION RISK SCREEN: I have an illness or condition that made me change the kind and/or amount of food I eat 0 No I eat fewer than two meals per day 0 No I eat few fruits and vegetables, or milk products 0 No I have three or more drinks of beer, liquor or wine almost every day 0 No I have tooth or mouth problems that make it hard for me to eat 0 No I don't always have enough money to buy the food I need 0 No [name], [name] ([id number]) [id number] [id number] Page 5 of 5 I eat alone most of the time 0 No I take three or more different prescribed or over-the-counter drugs a day 1 Yes Without wanting to, I have lost or gained 10 pounds in the last six months 0 No I am not always physically able to shop, cook and/or feed myself 0 No Nutrition Protocols Good Risk Protocol 0 No interventions needed Moderate Risk Protocol High Risk Proctocol Risk Level: Good Risk Score: 1 Electronic Signature(s) Signed: 06/29/2023 12:50:44 PM By: Jessica Aver MSN RN CNS WTA Entered By: [name] on 06/27/2023 11:10:58

## 2023-06-29 NOTE — Progress Notes (Signed)
RAYONNA, KRIEL R ([id number]) [id number] Page 1 of 11 Visit Report for 06/27/2023 Allergy List Details Patient Name: Date of Service: SHENAI, BINKLEY R. 06/27/2023 10:30 A M Medical Record Number: [id number] Patient Account Number: [id number] Date of Birth/Sex: Treating RN: 1981-05-13 (42 y.o. [name] Primary Care Ayleen Mckinstry: PA Zenovia Jordan, NO Other Clinician: Referring Bennett Ram: Treating Keyonia Gluth/Extender: Geralyn Corwin Self, Referral Weeks in Treatment: 0 Allergies Active Allergies Bactrim Reaction: itching Severity: Mild sulfa Reaction: itching Severity: Mild Type: Medication Allergy Notes Electronic Signature(s) Signed: 06/29/2023 12:50:44 PM By: Midge Aver MSN RN CNS WTA Entered By: Midge Aver on 06/27/2023 11:06:28 -------------------------------------------------------------------------------- Arrival Information Details Patient Name: Date of Service: LEA TH, Tanishia R. 06/27/2023 10:30 A M Medical Record Number: [id number] Patient Account Number: [id number] Date of Birth/Sex: Treating RN: 11/09/1981 (42 y.o. [name] Primary Care Milos Milligan: PA Zenovia Jordan, NO Other Clinician: Referring Doreene Forrey: Treating Zaahir Pickney/Extender: Geralyn Corwin Self, Referral Weeks in Treatment: 0 Visit Information Patient Arrived: Ambulatory Arrival Time: 11:00 Accompanied By: self Transfer Assistance: None Patient Identification Verified: Yes Secondary Verification Process Completed: Yes Patient Requires Transmission-Based Precautions: No Patient Has Alerts: No SHARMEEN, KALMBACH ([id number]) Patient Has Alerts: No History Since Last Visit All ordered tests and consults were completed: No Added or deleted any medications: No Any new allergies or adverse reactions: No Had a fall or experienced change in activities of daily living that may affect risk of falls: No Signs or symptoms of abuse/neglect since last visito No Hospitalized since last visit: No  [id number] Page 2 of 11 Implantable device outside of the clinic excluding cellular tissue based products placed in the center since last visit: No Has Dressing in Place as Prescribed: Yes Pain Present Now: No Electronic Signature(s) Signed: 06/29/2023 12:50:44 PM By: Midge Aver MSN RN CNS WTA Entered By: Midge Aver on 06/27/2023 11:01:10 -------------------------------------------------------------------------------- Clinic Level of Care Assessment Details Patient Name: Date of Service: [facility], Tura R. 06/27/2023 10:30 A M Medical Record Number: [id number] Patient Account Number: [id number] Date of Birth/Sex: Treating RN: [birth year] (42 y.o. [name] Primary Care Shruti Arrey: PA Zenovia Jordan, NO Other Clinician: Referring Lavayah Vita: Treating Genever Hentges/Extender: Geralyn Corwin Self, Referral Weeks in Treatment: 0 Clinic Level of Care Assessment Items TOOL 1 Quantity Score X- 1 0 Use when EandM and Procedure is performed on INITIAL visit ASSESSMENTS - Nursing Assessment / Reassessment X- 1 20 General Physical Exam (combine w/ comprehensive assessment (listed just below) when performed on new pt. evals) X- 1 25 Comprehensive Assessment (HX, ROS, Risk Assessments, Wounds Hx, etc.) ASSESSMENTS - Wound and Skin Assessment / Reassessment []  - 0 Dermatologic / Skin Assessment (not related to wound area) ASSESSMENTS - Ostomy and/or Continence Assessment and Care []  - 0 Incontinence Assessment and Management []  - 0 Ostomy Care Assessment and Management (repouching, etc.) PROCESS - Coordination of Care X - Simple Patient / Family Education for ongoing care 1 15 []  - 0 Complex (extensive) Patient / Family Education for ongoing care X- 1 10 Staff obtains Chiropractor, Records, T Results / Process Orders est []  - 0 Staff telephones HHA, Nursing Homes / Clarify orders / etc []  - 0 Routine Transfer to another Facility (non-emergent condition) []  - 0 Routine  Hospital Admission (non-emergent condition) X- 1 15 New Admissions / Manufacturing engineer / Ordering NPWT Apligraf, etc. , []  - 0 Emergency Hospital Admission (emergent condition) PROCESS - Special Needs []  - 0 Pediatric / Minor Patient Management []  - 0 Isolation Patient Management []  - 0 Hearing / Language / Visual  special needs []  - 0 Assessment of Community assistance (transportation, D/C planning, etc.) []  - 0 Additional assistance / Altered mentation Cochrane, Bennetta R [phone] [id number]) [id number] Page 3 of 11 []  - 0 Support Surface(s) Assessment (bed, cushion, seat, etc.) INTERVENTIONS - Miscellaneous []  - 0 External ear exam []  - 0 Patient Transfer (multiple staff / Nurse, adult / Similar devices) []  - 0 Simple Staple / Suture removal (25 or less) []  - 0 Complex Staple / Suture removal (26 or more) []  - 0 Hypo/Hyperglycemic Management (do not check if billed separately) X- 1 15 Ankle / Brachial Index (ABI) - do not check if billed separately Has the patient been seen at the hospital within the last three years: Yes Total Score: 100 Level Of Care: New/Established - Level 3 Electronic Signature(s) Signed: 06/29/2023 12:50:44 PM By: Midge Aver MSN RN CNS WTA Entered By: Midge Aver on 06/27/2023 11:49:11 -------------------------------------------------------------------------------- Compression Therapy Details Patient Name: Date of Service: Richrd Prime, Brenlynn R. 06/27/2023 10:30 A M Medical Record Number: [id number] Patient Account Number: [id number] Date of Birth/Sex: Treating RN: 02/27/1981 (42 y.o. [name] Primary Care Lihanna Biever: PA Zenovia Jordan, NO Other Clinician: Referring Gary Gabrielsen: Treating Benaiah Behan/Extender: Geralyn Corwin Self, Referral Weeks in Treatment: 0 Compression Therapy Performed for Wound Assessment: Wound #7 Right,Lateral Lower Leg Performed By: Clinician Midge Aver, RN Compression Type: Three Layer Post Procedure  Diagnosis Same as Pre-procedure Electronic Signature(s) Signed: 06/29/2023 12:50:44 PM By: Midge Aver MSN RN CNS WTA Entered By: Midge Aver on 06/27/2023 11:45:55 -------------------------------------------------------------------------------- Encounter Discharge Information Details Patient Name: Date of Service: [facility], Starlena R. 06/27/2023 10:30 A M Medical Record Number: [id number] Patient Account Number: [id number] Date of Birth/Sex: Treating RN: 03/12/1981 (42 y.o. [name] Primary Care Jayston Trevino: PA [address] [place] Other Clinician: CARLITA, MCPHAIL ([id number]) [id number] Page 4 of 11 Referring Sheridan Gettel: Treating Derhonda Eastlick/Extender: Geralyn Corwin Self, Referral Weeks in Treatment: 0 Encounter Discharge Information Items Post Procedure Vitals Discharge Condition: Stable Temperature (F): 98.1 Ambulatory Status: Ambulatory Pulse (bpm): 91 Discharge Destination: Home Respiratory Rate (breaths/min): 18 Transportation: Private Auto Blood Pressure (mmHg): 122/93 Accompanied By: self Schedule Follow-up Appointment: Yes Clinical Summary of Care: Electronic Signature(s) Signed: 06/27/2023 3:26:57 PM By: Midge Aver MSN RN CNS WTA Entered By: Midge Aver on 06/27/2023 15:26:57 -------------------------------------------------------------------------------- Lower Extremity Assessment Details Patient Name: Date of Service: LEA TH, Karmin R. 06/27/2023 10:30 A M Medical Record Number: [id number] Patient Account Number: [id number] Date of Birth/Sex: Treating RN: 06-11-1981 (42 y.o. [name] Primary Care Jex Strausbaugh: PA Zenovia Jordan, NO Other Clinician: Referring Dacey Milberger: Treating Madisson Kulaga/Extender: Geralyn Corwin Self, Referral Weeks in Treatment: 0 Edema Assessment Assessed: [Left: No] [Right: No] [Left: Edema] [Right: :] Calf Left: Right: Point of Measurement: 36 cm From Medial Instep 55 cm 61 cm Ankle Left: Right: Point of Measurement: 12 cm From  Medial Instep 38 cm 39 cm Knee To Floor Left: Right: From Medial Instep 49 cm 49 cm Vascular Assessment Pulses: Dorsalis Pedis Palpable: [Left:Yes] [Right:Yes] Doppler Audible: [Left:Yes] [Right:Yes] Extremity colors, hair growth, and conditions: Extremity Color: [Left:Hyperpigmented] [Right:Hyperpigmented] Hair Growth on Extremity: [Left:No] [Right:No] Temperature of Extremity: [Left:Warm] [Right:Warm] Dependent Rubor: [Left:No] [Right:No] Blanched when Elevated: [Left:No] [Right:No] Lipodermatosclerosis: [Left:No] [Right:No] Blood Pressure: Brachial: [Left:122] [Right:122] Ankle: [Left:Dorsalis Pedis: 114] [Right:Dorsalis Pedis: 90] Ankle Brachial Index: [Left:0.93] [Right:0.74 [id number] Page 5 of 11] Toe Nail Assessment Left: Right: Thick: No No Discolored: Yes Yes Deformed: No No Improper Length and Hygiene: No No Electronic Signature(s) Signed: 06/29/2023 12:50:44 PM By: Midge Aver MSN RN CNS WTA Entered  By: Midge Aver on 06/27/2023 11:31:35 -------------------------------------------------------------------------------- Multi Wound Chart Details Patient Name: Date of Service: ADILAH, WOLLAM. 06/27/2023 10:30 A M Medical Record Number: [id number] Patient Account Number: [id number] Date of Birth/Sex: Treating RN: 06-06-1981 (42 y.o. [name] Primary Care Trayquan Kolakowski: PA Zenovia Jordan, NO Other Clinician: Referring Arthurine Oleary: Treating Zaina Jenkin/Extender: Geralyn Corwin Self, Referral Weeks in Treatment: 0 Vital Signs Height(in): 73 Pulse(bpm): 91 Weight(lbs): 436 Blood Pressure(mmHg): 122/93 Body Mass Index(BMI): 57.5 Temperature(F): 98.1 Respiratory Rate(breaths/min): 18 [7:Photos:] [N/A:N/A] Right, Lateral Lower Leg N/A N/A Wound Location: Gradually Appeared N/A N/A Wounding Event: Venous Leg Ulcer N/A N/A Primary Etiology: Lymphedema, Hypertension N/A N/A Comorbid History: 06/06/2023 N/A N/A Date Acquired: 0 N/A N/A Weeks of  Treatment: Open N/A N/A Wound Status: No N/A N/A Wound Recurrence: 2x2.2x0.3 N/A N/A Measurements L x W x D (cm) 3.456 N/A N/A A (cm) : rea 1.037 N/A N/A Volume (cm) : Full Thickness Without Exposed N/A N/A Classification: Support Structures Large N/A N/A Exudate Amount: Purulent N/A N/A Exudate Type: yellow, brown, green N/A N/A Exudate Color: Small (1-33%) N/A N/A Granulation Amount: Red, Pink N/A N/A Granulation Quality: Medium (34-66%) [name]: Fat Layer (Subcutaneous Tissue): Yes N/A N/A Exposed Structures: Fascia: No Tendon: No Muscle: No Joint: No Amis, Casilda R ([id number]) [id number] Page 6 of 11 Bone: No None N/A N/A Epithelialization: Debridement - Excisional N/A N/A Debridement: 11:44 N/A N/A Pre-procedure Verification/Time Out Taken: Lidocaine 4% Topical Solution N/A N/A Pain Control: Subcutaneous, Slough N/A N/A Tissue Debrided: Skin/Subcutaneous Tissue N/A N/A Level: 3.45 N/A N/A Debridement A (sq cm): rea Curette N/A N/A Instrument: Minimum N/A N/A Bleeding: Pressure N/A N/A Hemostasis A chieved: 0 N/A N/A Procedural Pain: 0 N/A N/A Post Procedural Pain: Procedure was tolerated well N/A N/A Debridement Treatment Response: 2x2.2x0.3 N/A N/A Post Debridement Measurements L x W x D (cm) 1.037 N/A N/A Post Debridement Volume: (cm) Compression Therapy N/A N/A Procedures Performed: Debridement Treatment Notes Wound #7 (Lower Leg) Wound Laterality: Right, Lateral Cleanser Soap and Water Discharge Instruction: Gently cleanse wound with antibacterial soap, rinse and pat dry prior to dressing wounds Vashe 5.8 (oz) Discharge Instruction: Use vashe 5.8 (oz) as directed Peri-Wound Care Topical Gentamicin Discharge Instruction: Apply as directed by Magdalynn Davilla. Mupirocin Ointment Discharge Instruction: Apply as directed by Nyko Gell. Primary Dressing Hydrofera Blue Ready Transfer Foam, 2.5x2.5  (in/in) Discharge Instruction: Apply Hydrofera Blue Ready to wound bed as directed Secondary Dressing ABD Pad 5x9 (in/in) Discharge Instruction: Cover with ABD pad Secured With Compression Wrap Urgo K2 Lite, two layer compression system, large Compression Stockings Add-Ons Electronic Signature(s) Signed: 06/27/2023 2:02:11 PM By: Midge Aver MSN RN CNS WTA Previous Signature: 06/27/2023 12:18:12 PM Version By: Geralyn Corwin DO Entered By: Midge Aver on 06/27/2023 14:02:11 Multi-Disciplinary Care Plan Details -------------------------------------------------------------------------------- [name] ([id number]) [id number] Page 7 of 11 Patient Name: Date of Service: LEA [facility], Chemika R. 06/27/2023 10:30 A M Medical Record Number: [id number] Patient Account Number: [id number] Date of Birth/Sex: Treating RN: [birth year] (42 y.o. [name] Primary Care Briton Sellman: PA Zenovia Jordan, NO Other Clinician: Referring Blakelee Allington: Treating Alylah Blakney/Extender: Geralyn Corwin Self, Referral Weeks in Treatment: 0 Active Inactive Orientation to the Wound Care Program Nursing Diagnoses: Knowledge deficit related to the wound healing center program Goals: Patient/caregiver will verbalize understanding of the Wound Healing Center Program Date Initiated: 06/27/2023 Target Resolution Date: 07/04/2023 Goal Status: Active Interventions: Provide education on orientation to the wound center Notes: Venous Leg Ulcer Nursing Diagnoses: Actual venous Insuffiency (use after diagnosis is  confirmed) Knowledge deficit related to disease process and management Goals: Patient will maintain optimal edema control Date Initiated: 06/27/2023 Target Resolution Date: 07/28/2023 Goal Status: Active Patient/caregiver will verbalize understanding of disease process and disease management Date Initiated: 06/27/2023 Target Resolution Date: 07/28/2023 Goal Status: Active Verify adequate tissue perfusion  prior to therapeutic compression application Date Initiated: 06/27/2023 Target Resolution Date: 07/28/2023 Goal Status: Active Interventions: Assess peripheral edema status every visit. Compression as ordered Provide education on venous insufficiency Treatment Activities: Non-invasive vascular studies : 06/27/2023 T ordered outside of clinic : 06/27/2023 est Therapeutic compression applied : 06/27/2023 Notes: Wound/Skin Impairment Nursing Diagnoses: Impaired tissue integrity Knowledge deficit related to ulceration/compromised skin integrity Goals: Patient/caregiver will verbalize understanding of skin care regimen Date Initiated: 06/27/2023 Target Resolution Date: 07/28/2023 Goal Status: Active Ulcer/skin breakdown will have a volume reduction of 30% by week 4 Date Initiated: 06/27/2023 Target Resolution Date: 07/28/2023 Goal Status: Active Ulcer/skin breakdown will have a volume reduction of 50% by week 8 Date Initiated: 06/27/2023 Target Resolution Date: 08/27/2023 Goal Status: Active Ulcer/skin breakdown will have a volume reduction of 80% by week 12 Date Initiated: 06/27/2023 Target Resolution Date: 09/27/2023 Goal Status: Active Interventions: Assess patient/caregiver ability to obtain necessary supplies [place], Danaria R ([id number]) [id number] Page 8 of 11 Assess patient/caregiver ability to perform ulcer/skin care regimen upon admission and as needed Assess ulceration(s) every visit Provide education on ulcer and skin care Treatment Activities: Skin care regimen initiated : 06/27/2023 Notes: Electronic Signature(s) Signed: 06/27/2023 3:25:57 PM By: Midge Aver MSN RN CNS WTA Previous Signature: 06/27/2023 3:25:49 PM Version By: Midge Aver MSN RN CNS WTA Entered By: Midge Aver on 06/27/2023 15:25:57 -------------------------------------------------------------------------------- Pain Assessment Details Patient Name: Date of Service: Richrd Prime, Ameriah R. 06/27/2023  10:30 A M Medical Record Number: [id number] Patient Account Number: [id number] Date of Birth/Sex: Treating RN: 06-06-1981 (42 y.o. [name] Primary Care Theodore Rahrig: PA Zenovia Jordan, NO Other Clinician: Referring Donzel Romack: Treating Saphyre Cillo/Extender: Geralyn Corwin Self, Referral Weeks in Treatment: 0 Active Problems Location of Pain Severity and Description of Pain Patient Has Paino No Site Locations Pain Management and Medication Current Pain Management: Electronic Signature(s) Signed: 06/29/2023 12:50:44 PM By: Midge Aver MSN RN CNS WTA Entered By: Midge Aver on 06/27/2023 11:01:24 Loflin, Raylyn R ([id number]) [id number] Page 9 of 11 -------------------------------------------------------------------------------- Patient/Caregiver Education Details Patient Name: Date of Service: SANJUANITA, HOFFMEYER 8/7/2024andnbsp10:30 A M Medical Record Number: [id number] Patient Account Number: [id number] Date of Birth/Gender: Treating RN: [birth year] (42 y.o. [name] Primary Care Physician: PA Zenovia Jordan, NO Other Clinician: Referring Physician: Treating Physician/Extender: Geralyn Corwin Self, Referral Weeks in Treatment: 0 Education Assessment Education Provided To: Patient Education Topics Provided Welcome T [facility]-New Patient Packet: o Handouts: Welcome T [facility] o Wound Debridement: Handouts: Wound Debridement Methods: Explain/Verbal Responses: State content correctly Wound/Skin Impairment: Handouts: Caring for Your Ulcer Methods: Explain/Verbal Responses: State content correctly Electronic Signature(s) Signed: 06/29/2023 12:50:44 PM By: Midge Aver MSN RN CNS WTA Entered By: Midge Aver on 06/27/2023 15:26:24 -------------------------------------------------------------------------------- Wound Assessment Details Patient Name: Date of Service: Richrd Prime, Janazia R. 06/27/2023 10:30 A M Medical Record Number:  [id number] Patient Account Number: [id number] Date of Birth/Sex: Treating RN: 1980/12/18 (42 y.o. [name] Primary Care Daylin Eads: PA Zenovia Jordan, NO Other Clinician: Referring Margean Korell: Treating Journee Kohen/Extender: Geralyn Corwin Self, Referral Weeks in Treatment: 0 Wound Status Wound Number: 7 Primary Etiology: Venous Leg Ulcer Wound Location: Right, Lateral Lower Leg Wound Status: Open Wounding Event: Gradually Appeared Comorbid History:  Lymphedema, Hypertension Date Acquired: 06/06/2023 Weeks Of Treatment: 0 Clustered Wound: No Jablonski, Inga R ([id number]) [id number] Page 10 of 11 Photos Wound Measurements Length: (cm) 2 Width: (cm) 2.2 Depth: (cm) 0.3 Area: (cm) 3.456 Volume: (cm) 1.037 % Reduction in Area: % Reduction in Volume: Epithelialization: None Wound Description Classification: Full Thickness Without Exposed Suppor Exudate Amount: Large Exudate Type: Purulent Exudate Color: yellow, brown, green t Structures Foul Odor After Cleansing: No Slough/Fibrino Yes Wound Bed Granulation Amount: Small (1-33%) Exposed Structure Granulation Quality: Red, Pink Fascia Exposed: No Necrotic Amount: Medium (34-66%) Fat Layer (Subcutaneous Tissue) Exposed: Yes Necrotic Quality: Adherent Slough Tendon Exposed: No Muscle Exposed: No Joint Exposed: No Bone Exposed: No Treatment Notes Wound #7 (Lower Leg) Wound Laterality: Right, Lateral Cleanser Soap and Water Discharge Instruction: Gently cleanse wound with antibacterial soap, rinse and pat dry prior to dressing wounds Vashe 5.8 (oz) Discharge Instruction: Use vashe 5.8 (oz) as directed Peri-Wound Care Topical Gentamicin Discharge Instruction: Apply as directed by Ronie Fleeger. Mupirocin Ointment Discharge Instruction: Apply as directed by Myrle Dues. Primary Dressing Hydrofera Blue Ready Transfer Foam, 2.5x2.5 (in/in) Discharge Instruction: Apply Hydrofera Blue Ready to wound bed as  directed Secondary Dressing ABD Pad 5x9 (in/in) Discharge Instruction: Cover with ABD pad Secured With Compression Wrap Urgo K2 Lite, two layer compression system, large Compression Stockings Add-Ons Electronic Signature(s) Signed: 06/29/2023 12:50:44 PM By: Midge Aver MSN RN CNS Darene Lamer, Camber R 06/29/2023 12:50:44 PM By: Midge Aver MSN RN CNS WTA Signed: ([id number]) [id number] Page 11 of 11 Entered By: Midge Aver on 06/27/2023 11:22:25 -------------------------------------------------------------------------------- Vitals Details Patient Name: Date of Service: [facility], Khiley R. 06/27/2023 10:30 A M Medical Record Number: [id number] Patient Account Number: [id number] Date of Birth/Sex: Treating RN: 10-Jul-1981 (42 y.o. [name] Primary Care Bjorn Hallas: PA Zenovia Jordan, NO Other Clinician: Referring Akhilesh Sassone: Treating Yissel Habermehl/Extender: Geralyn Corwin Self, Referral Weeks in Treatment: 0 Vital Signs Time Taken: 11:01 Temperature (F): 98.1 Height (in): 73 Pulse (bpm): 91 Source: Stated Respiratory Rate (breaths/min): 18 Weight (lbs): 436 Blood Pressure (mmHg): 122/93 Source: Stated Reference Range: 80 - 120 mg / dl Body Mass Index (BMI): 57.5 Electronic Signature(s) Signed: 06/29/2023 12:50:44 PM By: Midge Aver MSN RN CNS WTA Entered By: Midge Aver on 06/27/2023 11:03:44

## 2023-07-02 ENCOUNTER — Other Ambulatory Visit (INDEPENDENT_AMBULATORY_CARE_PROVIDER_SITE_OTHER): Payer: Self-pay | Admitting: Internal Medicine

## 2023-07-02 DIAGNOSIS — I87311 Chronic venous hypertension (idiopathic) with ulcer of right lower extremity: Secondary | ICD-10-CM

## 2023-07-02 DIAGNOSIS — I890 Lymphedema, not elsewhere classified: Secondary | ICD-10-CM

## 2023-07-02 DIAGNOSIS — L97919 Non-pressure chronic ulcer of unspecified part of right lower leg with unspecified severity: Secondary | ICD-10-CM

## 2023-07-02 DIAGNOSIS — L97812 Non-pressure chronic ulcer of other part of right lower leg with fat layer exposed: Secondary | ICD-10-CM

## 2023-07-04 ENCOUNTER — Encounter: Payer: 59 | Admitting: Internal Medicine

## 2023-07-04 DIAGNOSIS — I87311 Chronic venous hypertension (idiopathic) with ulcer of right lower extremity: Secondary | ICD-10-CM | POA: Diagnosis not present

## 2023-07-04 NOTE — Progress Notes (Signed)
AMBERLE, GARCIAGONZALEZ [name] ([id number]) [id number] Page 1 of 10 Visit Report for 07/04/2023 Arrival Information Details Patient Name: Date of Service: [name], [name] 07/04/2023 1:15 PM Medical Record Number: [id number] Patient Account Number: [id number] Date of Birth/Sex: Treating RN: August 22, 1981 (42 y.o. [name] Primary Care Alexx Mcburney: PA Zenovia Jordan, NO Other Clinician: Referring Tillie Viverette: Treating Mellony Danziger/Extender: [name], [name], Referral Weeks in Treatment: 1 Visit Information History Since Last Visit Added or deleted any medications: No Patient Arrived: Ambulatory Any new allergies or adverse reactions: No Arrival Time: 13:36 Has Dressing in Place as Prescribed: Yes Accompanied By: self Has Compression in Place as Prescribed: Yes Transfer Assistance: Manual Pain Present Now: No Patient Identification Verified: Yes Secondary Verification Process Completed: Yes Patient Requires Transmission-Based Precautions: No Patient Has Alerts: No Electronic Signature(s) Signed: 07/04/2023 4:09:30 PM By: Midge Aver MSN RN CNS WTA Entered By: Midge Aver on 07/04/2023 13:37:35 -------------------------------------------------------------------------------- Clinic Level of Care Assessment Details Patient Name: Date of Service: [facility], [name] 07/04/2023 1:15 PM Medical Record Number: [id number] Patient Account Number: [id number] Date of Birth/Sex: Treating RN: 1981-05-30 (42 y.o. [name] Primary Care Victoriana Aziz: PA TIENT, NO Other Clinician: Referring Kalysta Kneisley: Treating Laksh Hinners/Extender: [name], [name], Referral Weeks in Treatment: 1 Clinic Level of Care Assessment Items TOOL 4 Quantity Score []  - 0 Use when only an EandM is performed on FOLLOW-UP visit ASSESSMENTS - Nursing Assessment / Reassessment []  - 0 Reassessment of Co-morbidities (includes updates in patient status) []  - 0 Reassessment of Adherence to Treatment Plan ASSESSMENTS  - Wound and Skin A ssessment / Reassessment []  - 0 Simple Wound Assessment / Reassessment - one wound []  - 0 Complex Wound Assessment / Reassessment - multiple wounds Olveda, [name] ([id number]) [id number] Page 2 of 10 []  - 0 Dermatologic / Skin Assessment (not related to wound area) ASSESSMENTS - Focused Assessment []  - 0 Circumferential Edema Measurements - multi extremities []  - 0 Nutritional Assessment / Counseling / Intervention []  - 0 Lower Extremity Assessment (monofilament, tuning fork, pulses) []  - 0 Peripheral Arterial Disease Assessment (using hand held doppler) ASSESSMENTS - Ostomy and/or Continence Assessment and Care []  - 0 Incontinence Assessment and Management []  - 0 Ostomy Care Assessment and Management (repouching, etc.) PROCESS - Coordination of Care []  - 0 Simple Patient / Family Education for ongoing care []  - 0 Complex (extensive) Patient / Family Education for ongoing care []  - 0 Staff obtains Chiropractor, Records, T Results / Process Orders est []  - 0 Staff telephones HHA, Nursing Homes / Clarify orders / etc []  - 0 Routine Transfer to another Facility (non-emergent condition) []  - 0 Routine Hospital Admission (non-emergent condition) []  - 0 New Admissions / Manufacturing engineer / Ordering NPWT Apligraf, etc. , []  - 0 Emergency Hospital Admission (emergent condition) []  - 0 Simple Discharge Coordination []  - 0 Complex (extensive) Discharge Coordination PROCESS - Special Needs []  - 0 Pediatric / Minor Patient Management []  - 0 Isolation Patient Management []  - 0 Hearing / Language / Visual special needs []  - 0 Assessment of Community assistance (transportation, D/C planning, etc.) []  - 0 Additional assistance / Altered mentation []  - 0 Support Surface(s) Assessment (bed, cushion, seat, etc.) INTERVENTIONS - Wound Cleansing / Measurement []  - 0 Simple Wound Cleansing - one wound []  - 0 Complex Wound  Cleansing - multiple wounds []  - 0 Wound Imaging (photographs - any number of wounds) []  - 0 Wound Tracing (instead of photographs) []  -  0 Simple Wound Measurement - one wound []  - 0 Complex Wound Measurement - multiple wounds INTERVENTIONS - Wound Dressings []  - 0 Small Wound Dressing one or multiple wounds []  - 0 Medium Wound Dressing one or multiple wounds []  - 0 Large Wound Dressing one or multiple wounds []  - 0 Application of Medications - topical []  - 0 Application of Medications - injection INTERVENTIONS - Miscellaneous []  - 0 External ear exam []  - 0 Specimen Collection (cultures, biopsies, blood, body fluids, etc.) []  - 0 Specimen(s) / Culture(s) sent or taken to Lab for analysis [name], [name] ([id number]) [id number] Page 3 of 10 []  - 0 Patient Transfer (multiple staff / Nurse, adult / Similar devices) []  - 0 Simple Staple / Suture removal (25 or less) []  - 0 Complex Staple / Suture removal (26 or more) []  - 0 Hypo / Hyperglycemic Management (close monitor of Blood Glucose) []  - 0 Ankle / Brachial Index (ABI) - do not check if billed separately []  - 0 Vital Signs Has the patient been seen at the hospital within the last three years: Yes Total Score: 0 Level Of Care: ____ Electronic Signature(s) Signed: 07/04/2023 4:09:30 PM By: Midge Aver MSN RN CNS WTA Entered By: Midge Aver on 07/04/2023 14:17:44 -------------------------------------------------------------------------------- Compression Therapy Details Patient Name: Date of Service: [name], [name] 07/04/2023 1:15 PM Medical Record Number: [id number] Patient Account Number: [id number] Date of Birth/Sex: Treating RN: 03/20/1981 (42 y.o. [name] Primary Care Kentrell Hallahan: PA Zenovia Jordan, NO Other Clinician: Referring Julea Hutto: Treating Shloima Clinch/Extender: [name], [name], Referral Weeks in Treatment: 1 Compression Therapy Performed for Wound Assessment: Wound #7  Right,Lateral Lower Leg Performed By: Clinician Midge Aver, RN Compression Type: Three Layer Post Procedure Diagnosis Same as Pre-procedure Electronic Signature(s) Signed: 07/04/2023 4:09:30 PM By: Midge Aver MSN RN CNS WTA Entered By: Midge Aver on 07/04/2023 14:01:58 -------------------------------------------------------------------------------- Encounter Discharge Information Details Patient Name: Date of Service: [facility], [name] 07/04/2023 1:15 PM Medical Record Number: [id number] Patient Account Number: [id number] Date of Birth/Sex: Treating RN: June 23, 1981 (42 y.o. [name] Primary Care Kaylean Tupou: PA Zenovia Jordan, NO Other Clinician: Referring Xavior Niazi: Treating Judie Hollick/Extender: Gwendolyn Lima Self, Referral Weeks in Treatment: 1 Cuffie, Allycia [name] ([id number]) [id number] Page 4 of 10 Encounter Discharge Information Items Discharge Condition: Stable Ambulatory Status: Ambulatory Discharge Destination: Home Transportation: Private Auto Accompanied By: self Schedule Follow-up Appointment: Yes Clinical Summary of Care: Electronic Signature(s) Signed: 07/04/2023 4:09:30 PM By: Midge Aver MSN RN CNS WTA Entered By: Midge Aver on 07/04/2023 14:28:04 -------------------------------------------------------------------------------- Lower Extremity Assessment Details Patient Name: Date of Service: [facility], [name] 07/04/2023 1:15 PM Medical Record Number: [id number] Patient Account Number: [id number] Date of Birth/Sex: Treating RN: 08/31/1981 (42 y.o. [name] Primary Care Holley Kocurek: PA TIENT, NO Other Clinician: Referring Pritesh Sobecki: Treating Reya Aurich/Extender: [name], [name], Referral Weeks in Treatment: 1 Edema Assessment Assessed: [Left: Yes] [Right: Yes] Edema: [Left: Yes] [Right: Yes] Calf Left: Right: Point of Measurement: 36 cm From Medial Instep 55 cm 59 cm Ankle Left: Right: Point of Measurement: 12 cm From  Medial Instep 38 cm 40 cm Knee To Floor Left: Right: From Medial Instep 49 cm 49 cm Vascular Assessment Pulses: Dorsalis Pedis Palpable: [Left:Yes] [Right:Yes] Extremity colors, hair growth, and conditions: Extremity Color: [Left:Hyperpigmented] [Right:Hyperpigmented] Hair Growth on Extremity: [Left:Yes] [Right:Yes] Temperature of Extremity: [Left:Warm] [Right:Warm] Capillary Refill: [Left:< 3 seconds] [Right:< 3 seconds] Dependent Rubor: [Left:No] [Right:No] Blanched when Elevated: [Left:No No] [  Right:No No] Toe Nail Assessment Left: Right: Thick: No No Discolored: No No Deformed: No No Improper Length and Hygiene: No [name], [name] ([id number]) [id number] Page 5 of 10 Electronic Signature(s) Signed: 07/04/2023 4:09:30 PM By: Midge Aver MSN RN CNS WTA Entered By: Midge Aver on 07/04/2023 13:53:27 -------------------------------------------------------------------------------- Multi Wound Chart Details Patient Name: Date of Service: [facility], Jaylei [name]. 07/04/2023 1:15 PM Medical Record Number: [id number] Patient Account Number: [id number] Date of Birth/Sex: Treating RN: June 26, 1981 (42 y.o. [name] Primary Care Abreanna Drawdy: PA TIENT, NO Other Clinician: Referring Dawnetta Copenhaver: Treating Audrea Bolte/Extender: [name], [name], Referral Weeks in Treatment: 1 Vital Signs Height(in): 73 Pulse(bpm): 100 Weight(lbs): 436 Blood Pressure(mmHg): 113/78 Body Mass Index(BMI): 57.5 Temperature(F): 98.1 Respiratory Rate(breaths/min): 18 [7:Photos:] [N/A:N/A] Right, Lateral Lower Leg N/A N/A Wound Location: Gradually Appeared N/A N/A Wounding Event: Venous Leg Ulcer N/A N/A Primary Etiology: Lymphedema, Hypertension N/A N/A Comorbid History: 06/06/2023 N/A N/A Date Acquired: 1 N/A N/A Weeks of Treatment: Open N/A N/A Wound Status: No N/A N/A Wound Recurrence: 2x2x0.3 N/A N/A Measurements L x W x D (cm) 3.142 N/A N/A A (cm) : rea 0.942  N/A N/A Volume (cm) : 9.10% N/A N/A % Reduction in A rea: 9.20% N/A N/A % Reduction in Volume: Full Thickness Without Exposed N/A N/A Classification: Support Structures Large N/A N/A Exudate Amount: Purulent N/A N/A Exudate Type: yellow, brown, green N/A N/A Exudate Color: Small (1-33%) N/A N/A Granulation Amount: Red, Pink N/A N/A Granulation Quality: Medium (34-66%) [name]: Fat Layer (Subcutaneous Tissue): Yes N/A N/A Exposed Structures: Fascia: No Tendon: No Muscle: No Joint: No Bone: No None N/A N/A Epithelialization: Treatment Notes PRIM, AMOAH [name] ([id number]) [id number] Page 6 of 10 Electronic Signature(s) Signed: 07/04/2023 4:09:30 PM By: Midge Aver MSN RN CNS WTA Entered By: Midge Aver on 07/04/2023 13:54:10 -------------------------------------------------------------------------------- Multi-Disciplinary Care Plan Details Patient Name: Date of Service: [facility], [name] 07/04/2023 1:15 PM Medical Record Number: [id number] Patient Account Number: [id number] Date of Birth/Sex: Treating RN: 05/12/1981 (42 y.o. [name] Primary Care Boykin Baetz: PA Zenovia Jordan, NO Other Clinician: Referring Tiauna Whisnant: Treating Catori Panozzo/Extender: [name], [name], Referral Weeks in Treatment: 1 Active Inactive Venous Leg Ulcer Nursing Diagnoses: Actual venous Insuffiency (use after diagnosis is confirmed) Knowledge deficit related to disease process and management Goals: Patient will maintain optimal edema control Date Initiated: 06/27/2023 Target Resolution Date: 07/28/2023 Goal Status: Active Patient/caregiver will verbalize understanding of disease process and disease management Date Initiated: 06/27/2023 Target Resolution Date: 07/28/2023 Goal Status: Active Verify adequate tissue perfusion prior to therapeutic compression application Date Initiated: 06/27/2023 Target Resolution Date: 07/28/2023 Goal Status:  Active Interventions: Assess peripheral edema status every visit. Compression as ordered Provide education on venous insufficiency Treatment Activities: Non-invasive vascular studies : 06/27/2023 T ordered outside of clinic : 06/27/2023 est Therapeutic compression applied : 06/27/2023 Notes: Wound/Skin Impairment Nursing Diagnoses: Impaired tissue integrity Knowledge deficit related to ulceration/compromised skin integrity Goals: Patient/caregiver will verbalize understanding of skin care regimen Date Initiated: 06/27/2023 Target Resolution Date: 07/28/2023 Goal Status: Active Ulcer/skin breakdown will have a volume reduction of 30% by week 4 Date Initiated: 06/27/2023 Target Resolution Date: 07/28/2023 Goal Status: Active Ulcer/skin breakdown will have a volume reduction of 50% by week 8 Fennell, Yamil [name] ([id number]) [id number] Page 7 of 10 Date Initiated: 06/27/2023 Target Resolution Date: 08/27/2023 Goal Status: Active Ulcer/skin breakdown will have a volume reduction of 80% by week 12 Date Initiated: 06/27/2023 Target Resolution Date: 09/27/2023 Goal Status:  Active Interventions: Assess patient/caregiver ability to obtain necessary supplies Assess patient/caregiver ability to perform ulcer/skin care regimen upon admission and as needed Assess ulceration(s) every visit Provide education on ulcer and skin care Treatment Activities: Skin care regimen initiated : 06/27/2023 Notes: Electronic Signature(s) Signed: 07/04/2023 4:09:30 PM By: Midge Aver MSN RN CNS WTA Entered By: Midge Aver on 07/04/2023 14:18:27 -------------------------------------------------------------------------------- Pain Assessment Details Patient Name: Date of Service: [name], [name] 07/04/2023 1:15 PM Medical Record Number: [id number] Patient Account Number: [id number] Date of Birth/Sex: Treating RN: [birth year] (42 y.o. [name] Primary Care Neysha Criado: PA Zenovia Jordan, NO Other  Clinician: Referring Gearldine Looney: Treating Ariah Mower/Extender: [name], [name], Referral Weeks in Treatment: 1 Active Problems Location of Pain Severity and Description of Pain Patient Has Paino No Site Locations Pain Management and Medication Current Pain Management: Electronic Signature(s) Signed: 07/04/2023 4:09:30 PM By: Midge Aver MSN RN CNS WTA Entered By: Midge Aver on 07/04/2023 13:44:15 Shontz, [name] [name] ([id number]) [id number] Page 8 of 10 -------------------------------------------------------------------------------- Patient/Caregiver Education Details Patient Name: Date of ServicePIERRA, [name] 8/14/2024andnbsp1:15 PM Medical Record Number: [id number] Patient Account Number: [id number] Date of Birth/Gender: Treating RN: [birth year] (42 y.o. [name] Primary Care Physician: PA Zenovia Jordan, NO Other Clinician: Referring Physician: Treating Physician/Extender: [name], [name], Referral Weeks in Treatment: 1 Education Assessment Education Provided To: Patient Education Topics Provided Wound/Skin Impairment: Handouts: Caring for Your Ulcer Methods: Explain/Verbal Responses: State content correctly Electronic Signature(s) Signed: 07/04/2023 4:09:30 PM By: Midge Aver MSN RN CNS WTA Entered By: Midge Aver on 07/04/2023 14:18:38 -------------------------------------------------------------------------------- Wound Assessment Details Patient Name: Date of Service: [name], [name] 07/04/2023 1:15 PM Medical Record Number: [id number] Patient Account Number: [id number] Date of Birth/Sex: Treating RN: 03-Feb-1981 (42 y.o. [name] Primary Care Lansing Sigmon: PA Zenovia Jordan, NO Other Clinician: Referring Emersen Carroll: Treating Danahi Reddish/Extender: [name], [name], Referral Weeks in Treatment: 1 Wound Status Wound Number: 7 Primary Etiology: Venous Leg Ulcer Wound Location: Right, Lateral Lower Leg Wound Status:  Open Wounding Event: Gradually Appeared Comorbid History: Lymphedema, Hypertension Date Acquired: 06/06/2023 Weeks Of Treatment: 1 Clustered Wound: No Photos [name], [name] ([id number]) [id number] Page 9 of 10 Wound Measurements Length: (cm) 2 Width: (cm) 2 Depth: (cm) 0.3 Area: (cm) 3.142 Volume: (cm) 0.942 % Reduction in Area: 9.1% % Reduction in Volume: 9.2% Epithelialization: None Wound Description Classification: Full Thickness Without Exposed Suppor Exudate Amount: Large Exudate Type: Purulent Exudate Color: yellow, brown, green t Structures Foul Odor After Cleansing: No Slough/Fibrino Yes Wound Bed Granulation Amount: Small (1-33%) Exposed Structure Granulation Quality: Red, Pink Fascia Exposed: No Necrotic Amount: Medium (34-66%) Fat Layer (Subcutaneous Tissue) Exposed: Yes Necrotic Quality: Adherent Slough Tendon Exposed: No Muscle Exposed: No Joint Exposed: No Bone Exposed: No Treatment Notes Wound #7 (Lower Leg) Wound Laterality: Right, Lateral Cleanser Soap and Water Discharge Instruction: Gently cleanse wound with antibacterial soap, rinse and pat dry prior to dressing wounds Vashe 5.8 (oz) Discharge Instruction: Use vashe 5.8 (oz) as directed Peri-Wound Care Topical Gentamicin Discharge Instruction: Apply as directed by Marybell Robards. Mupirocin Ointment Discharge Instruction: Apply as directed by Aayla Marrocco. Primary Dressing Hydrofera Blue Ready Transfer Foam, 2.5x2.5 (in/in) Discharge Instruction: Apply Hydrofera Blue Ready to wound bed as directed Secondary Dressing ABD Pad 5x9 (in/in) Discharge Instruction: Cover with ABD pad Secured With Compression Wrap Urgo K2 Lite, two layer compression system, large Compression Stockings Add-Ons Electronic Signature(s) Signed: 07/04/2023 4:09:30 PM By: Midge Aver MSN RN CNS WTA  Entered By: Midge Aver on 07/04/2023 13:50:11 Cecere, Cathlene [name] ([id number])  [id number] Page 10 of 10 -------------------------------------------------------------------------------- Vitals Details Patient Name: Date of ServiceAMARIE, [name] 07/04/2023 1:15 PM Medical Record Number: [id number] Patient Account Number: [id number] Date of Birth/Sex: Treating RN: 1981/10/01 (42 y.o. [name] Primary Care Daniyah Fohl: PA TIENT, NO Other Clinician: Referring Alanda Colton: Treating Kaylin Marcon/Extender: [name], [name], Referral Weeks in Treatment: 1 Vital Signs Time Taken: 13:38 Temperature (F): 98.1 Height (in): 73 Pulse (bpm): 100 Weight (lbs): 436 Respiratory Rate (breaths/min): 18 Body Mass Index (BMI): 57.5 Blood Pressure (mmHg): 113/78 Reference Range: 80 - 120 mg / dl Electronic Signature(s) Signed: 07/04/2023 4:09:30 PM By: Midge Aver MSN RN CNS WTA Entered By: Midge Aver on 07/04/2023 13:43:58

## 2023-07-04 NOTE — Progress Notes (Signed)
EKTA, TESORIERO [name] ([id number]) [id number] Page 1 of 8 Visit Report for 07/04/2023 HPI Details Patient Name: Date of Service: [name], [name] 07/04/2023 1:15 PM Medical Record Number: [id number] Patient Account Number: [id number] Date of Birth/Sex: Treating RN: Jan 18, 1981 (42 y.o. [name] Primary Care Provider: PA TIENT, NO Other Clinician: Referring Provider: Treating Provider/Extender: [name], [name], Referral Weeks in Treatment: 1 History of Present Illness HPI Description: 42 year old patient well known to our [name] care clinic where she has been seen since 2016 for bilateral lower extremity venous insufficiency disease with lymphedema and multiple ulcerations associated with morbid obesity. she had custom-made compression stockings and lymphedema pumps which were used in the past. most recently she was admitted to the hospital between October 11 and 09/02/2017 with sepsis, lower extremity wounds and lymphedema.she was initially treated in the outpatient with Keflex and Bactrim. she was initially treated in the hospital with vancomycin and Zosyn and changed over to Unasyn until her white count improved and her blood cultures were negative for 3 days. After her inpatient management she was discharged home on Augmentin to end on 09/13/2017 with a 14 day course. she has had outpatient vascular duplex scans completed in November 2017 and her right ABI was 1.1 and the left ABI is 1.3. she had normal toe brachial indices bilaterally.she had three-vessel runoff in the right lower extremity and two-vessel runoff in the left lower extremity. On questioning the patient she does have custom made compression stockings and also has a lymphedema pump but has not been using it appropriately and has not been taking good care of herself. 09/17/2017 -- she returns today with compression stockings on the left side and the right side has had significant  amount of drainage and has a very strong odor 09/24/2017 -- the drainage is increased significantly and she has more lymphedema and a very strong odor to her Jessica. Though she does not have systemic symptoms, or overt infection I believe she will benefit from some doxycycline given empirically. 10/01/2017 -- after starting the doxycycline and changing the dressing twice a week her symptoms and signs have definitely improved overall. 10/08/2017 -- she has completed her course of doxycycline but continues to have a lot of drainage and needs twice a week dressing changes. 11/08/17-she is here in follow-up evaluation for right lower extremity ulcers. She admits to using her lymphedema pumps twice daily, one hour per session. she is voicing no complaints or concerns, no signs of infection will change to [facility] 12/14/17 on evaluation today patient appears to be doing very well in regard to her wounds. She has been tolerating the dressing changes she continues to develop some portly the adherent granular tissue on the surface of the Jessica with some Slough. Obviously we are trying to get too much better Jessica bed. With that being said the hyper granulation the Hydrofera Blue Dressing to have helped with which is excellent news. However I think it may be time to try something a little bit different at this point. 01/11/18 on evaluation today patient appears to be doing fairly well in regard to her right lateral lower extremity ulcers. This shows excellent signs of filling in which is great news. There does not appear to be any evidence of infection which is also good news. She does continue to work as well is good school. She is having no pain. 01/22/18 on evaluation today patient appears to be doing a little bit worse in my opinion  in regard to the overall quality of that granulation on her right lower extremity. She was not here last week due to being sick with a stomach virus this may have something to do with  the fact that her Jessica appears to be a little bit worse. With that being said I'm also thinking that after switching from the [name] Dressing to the silver collagen would really has not looked that's good in my opinion. We may want to swit 02/11/18 on evaluation today patient's right lower/lateral lower extremity ulcers appear to be doing very well at this point. Especially the more proximal ulcer has filled in much closer to surface which is good news. Nonetheless both show signs of improvement which is great news. There does not appear to be any evidence of infection which is also good news. In general patient has been doing well tolerating the wraps as well as the [employer]. 02/18/18 on evaluation today patient appears to be doing a little bit worse in regard to the periwound region the wounds themselves do not look much deteriorated to me. With that being said she has several small blisters/pustules noted in the periwound and there was a significant amount of drainage and maceration compared to previous. There has been a time that we had to bring her back for twice a week dressing changes as far as her wrap was concerned it has been a while since we've done that however. With that being said the patient has been having some burning and in general I'm concerned about the possibility of infection. She has previously taken doxycycline with good result. Fortunately there does not appear to be any evidence of overall worsening in regard to the size of the Jessica and in fact the upper Jessica actually appears to be showing signs of good epithelialization. 03/18/18 on evaluation today patient appears to be doing excellent in regard to her right lateral lower extremity ulcer. She has been tolerating the dressing changes without complication. Fortunately this seems to be making great progress. Overall I see no signs of infection and there is dramatic improvement overall even compared to last  week. 03/28/18 on evaluation today patient appears to be doing very well in regard to her right lateral lower surety ulcer. She has been tolerating the dressing changes without complication at this point. She states currently that she's having no significant discomfort which is excellent as well. Overall I'm pleased with how things seem to be progressing. [name], [name] ([id number]) [id number] Page 2 of 8 04/01/18 on evaluation today patient appears to be doing excellent in regard to her right lateral lower extremity ulcers. She has been tolerating the dressing changes without complication which is good news. With that being said the wraps still continues to show signs of helping with her fluid she does have Juxta-Lite compression stockings for when we are done with the current treatment regimen once everything heals. Mainly she just has the one area still remaining the smaller of the two wings is pretty much closed at this point there's just a very slight opening noted. 04/08/18 on evaluation today patient appears to be doing better in regard to the original Jessica on the right lateral lower extremity that we have been managing. Unfortunately she has a new area of weeping more anterior on the right lateral lower extremity and on the left lower extremity she has two new ulcers there appears to be some cellulitis noted at this time. I am concerned about the fact that  this may in fact be an infection that has caused the worsening and swelling in the past this has been the case when we previously attempted to determine what was going on when she had down slides like this. With that being said the patient is seeming to tolerate the wraps fairly well for the most part. 04/17/18; since last time the patient was seen in this clinic she was hospitalized from 04/11/18 through 04/13/18; she presented with bilateral lower extremity pain worse on the right and a fever of up to 104. Noteworthy  that when she was in the clinic last week she had new wounds on the left leg culture grew MRSA and she was prescribed Bactrim. She had 2 days of IV Vanco and Zosyn in the hospital. Her blood cultures were negative. She was discharged on Bactrim to cover the original MRSA on the right leg and Keflex to cover the possibility of strep. The hospitalist had a conversation with infectious disease. The patient arrives in clinic today for nurse check however given the recent hospitalization I was asked to look at her. The patient states she feels a lot better. No fever or chills. Still some pain in the right calf but a lot better. She arrived in the hospital with a white count of 15.6, the next day was 10.8. Comprehensive metabolic panel was normal. She is still taking Keflex and Bactrim It would appear that she had a surgical IandD at the bedside of the right calf felt to have a underlying abscess. According to our intake nurse the Jessica has expanded quite bit on the right lateral calf. She has no open area on the left anterior and left posterior calf as described last time 04/23/18 on evaluation today patient actually appears to be doing much better than when I last saw her. This obviously has been a couple weeks ago and in the interim she was admitted to the hospital for IV antibiotic therapy due to cellulitis, discharge, and fortunately the substance which hadn't sued has completely resolved. Her swelling seems to be better in regard to her lower extremities as well and the wounds that opened up as results of the infection seems to be showing signs of improvement. There is some Slough noted on the left lateral Jessica otherwise a lot of weeping noted of the right lateral leg. 04/29/18 on evaluation today patient appears to be doing excellent in regard to her bilateral lower extremity ulcers. She's been tolerating the dressing changes without complication there does not appear to be any evidence of infection at  this time. Overall I think she is made great improvement and seems to be showing signs of coming back around where she was prior to the cellulitis and sepsis episode. 05/06/18 on evaluation today patient appears to be doing better in regard to her bilateral ocean the ulcers. She's been tolerating the dressing changes without complication. Fortunately there does not appear to be any evidence of infection at this time. Her swelling is significantly down overall seems to be showing signs of good improvement as well. Fortunately I do believe that she is progressing nicely back to where she was prior to the cellulitis/sepsis issue. 05/13/18 on evaluation today patient seems to be showing signs of great improvement she's been tolerating the dressing changes without complication and overall there does not appear to be any evidence of infection. All of which is good news. The only issue she really have today is that some of the [name] Dressing actually was  stuck to the periwound of the right lateral lower extremity however this was able to be removed during the debridement without complication. 05/21/18 on evaluation today patient appears to be doing very well in regard to her bilateral lower extremity ulcers. She has been tolerating the compression wraps without complication. There does not appear to be any evidence of infection at this point which is good news as well. Overall I'm very pleased with the progress that has been made. She likewise is very happy. 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 05/28/18 on evaluation today patient actually appears to be doing excellent in regard to her bilateral lower [place] ulcers. She's made great progress since last week and overall I'm very pleased in this regard. She states that she's not have any discomfort either which is also good news there's  definitely no evidence of infection. 06/03/18 on evaluation today patient actually appears to be doing excellent in regard to her ulcerations in fact these areas on both lower extremities were almost completely healed. Unfortunately she has a situation going on her life right now she actually found her husband deceased last 2023/07/19. She states that she has been quite a bit upset since that time unfortunately this is obviously a very big blow to her. Nonetheless she is concerned about the funeral and having to wear certain shoes for this that she states she cannot fit in with the wraps. She wonders if she could potentially come back on July 19, 2023 to have her wraps changed and to switch into her Juxta-Lite compression at that point. I think that would be an okay thing that we could do for her. Nonetheless she fortunately seems to be tolerating the wraps very well and again has made excellent progress with the [name] Dressing 06/11/18 on evaluation today patient appears to be doing rather well in regard to her bilateral lower extremities in fact everything appears to be completely healed at this point which is excellent news. There does not appear to be any evidence of infection at this time which is great. In fact overall I do believe she is completely resolved in regard to her bilateral lower extremity ulcers. She is extremely happy in this regard. 06/27/2023 Ms. Kandas Kravets is a 42 year old female with a past medical history of venous insufficiency and lymphedema that presents to the clinic for a 3-week history of nonhealing Jessica to the right lower extremity. She states this started spontaneously. She does not wear compression stockings. She has been keeping the area covered. She currently denies signs of infection. 8/14; patient readmitted to clinic last week. She has venous insufficiency and chronic secondary lymphedema. She has a nonhealing Jessica on the right lateral lower leg. She has been using  gent mupirocin and Hydrofera Blue using an Urgo K2 lite wrap. Her ABI on the right leg was 0.74 on admission to clinic. Formal arterial studies were ordered but we have not yet had any contact with the patient. Also there was some question about juxta lites for this patient we have not heard anything about that either. Electronic Signature(s) Signed: 07/04/2023 4:20:15 PM By: Baltazar Najjar MD Entered By: Baltazar Najjar on 07/04/2023 14:04:27 [name] ([id number]) [id number] Page 3 of 8 -------------------------------------------------------------------------------- Physical Exam Details [facility]: [name] [name] 07/04/2023 1:15 PM Medical Record Number: [id number] Patient Account Number: [id number] Date of Birth/Sex: Treating RN: 03-04-1981 (42  y.o. [name] Primary Care Provider: PA Zenovia Jordan, NO Other Clinician: Referring Provider: Treating Provider/Extender: [name], [name], Referral Weeks in Treatment: 1 Constitutional Sitting or standing Blood Pressure is within target range for patient.. Pulse regular and within target range for patient.Marland Kitchen Respirations regular, non-labored and within target range.. Temperature is normal and within the target range for the patient.Marland Kitchen appears in no distress. Cardiovascular . Notes Jessica exam; right lower extremity. Patient has a Jessica with a nonviable surface no visible healthy granulation. There is no evidence of surrounding infection. Her edema control is poor. She has venous stasis changes but clearly secondary lymphedema. Pedal pulses are reduced but palpable. Her foot is warm Electronic Signature(s) Signed: 07/04/2023 4:20:15 PM By: Baltazar Najjar MD Entered By: Baltazar Najjar on 07/04/2023 14:06:29 -------------------------------------------------------------------------------- Physician Orders Details Patient Name: Date of Service: [name], [name] 07/04/2023 1:15  PM Medical Record Number: [id number] Patient Account Number: [id number] Date of Birth/Sex: Treating RN: 08-Feb-1981 (42 y.o. [name] Primary Care Provider: PA Zenovia Jordan, NO Other Clinician: Referring Provider: Treating Provider/Extender: [name], [name], Referral Weeks in Treatment: 1 Verbal / Phone Orders: No Diagnosis Coding Follow-up Appointments Return Appointment in 1 week. Bathing/ Shower/ Hygiene May shower with Jessica dressing protected with water repellent cover or cast protector. No tub bath. Anesthetic (Use 'Patient Medications' Section for Anesthetic Order Entry) Lidocaine applied to Jessica bed Edema Control - Lymphedema / Segmental Compressive Device / Other Optional: One layer of unna paste to top of compression wrap (to act as an anchor). UrgoK2 LITE - Large Wiest, Shalynn [name] ([id number]) [id number] Page 4 of 8 Jessica Treatment [name] - Lower Leg Jessica Laterality: Right, Lateral Cleanser: Soap and Water 1 x Per Week/30 Days Discharge Instructions: Gently cleanse Jessica with antibacterial soap, rinse and pat dry prior to dressing wounds Cleanser: Vashe 5.8 (oz) 1 x Per Week/30 Days Discharge Instructions: Use vashe 5.8 (oz) as directed Topical: Gentamicin 1 x Per Week/30 Days Discharge Instructions: Apply as directed by provider. Topical: Mupirocin Ointment 1 x Per Week/30 Days Discharge Instructions: Apply as directed by provider. Prim Dressing: Hydrofera Blue Ready Transfer Foam, 2.5x2.5 (in/in) 1 x Per Week/30 Days ary Discharge Instructions: Apply Hydrofera Blue Ready to Jessica bed as directed Secondary Dressing: ABD Pad 5x9 (in/in) 1 x Per Week/30 Days Discharge Instructions: Cover with ABD pad Compression Wrap: Urgo K2 Lite, two layer compression system, large 1 x Per Week/30 Days Electronic Signature(s) Signed: 07/04/2023 4:09:30 PM By: Midge Aver MSN RN CNS WTA Signed: 07/04/2023 4:20:15 PM By: Baltazar Najjar  MD Entered By: Midge Aver on 07/04/2023 14:24:12 -------------------------------------------------------------------------------- Problem List Details Patient Name: Date of Service: [name], [name] 07/04/2023 1:15 PM Medical Record Number: [id number] Patient Account Number: [id number] Date of Birth/Sex: Treating RN: [birth year] (42 y.o. [name] Primary Care Provider: PA TIENT, NO Other Clinician: Referring Provider: Treating Provider/Extender: Chauncey Mann, [name], Referral Weeks in Treatment: 1 Active Problems ICD-10 Encounter Code Description Active Date MDM Diagnosis L97.812 Non-pressure chronic ulcer of other part of right lower leg with fat layer 06/27/2023 No Yes exposed I87.311 Chronic venous hypertension (idiopathic) with ulcer of right lower extremity 06/27/2023 No Yes I89.0 Lymphedema, not elsewhere classified 06/27/2023 No Yes Inactive Problems Resolved Problems [name], [name] ([id number]) [id number] Page 5 of 8 Electronic Signature(s) Signed: 07/04/2023 4:20:15 PM By: Baltazar Najjar MD Entered By: Baltazar Najjar on 07/04/2023 14:02:28 -------------------------------------------------------------------------------- Progress Note Details Patient Name: Date  of Service: [name], [name] 07/04/2023 1:15 PM Medical Record Number: [id number] Patient Account Number: [id number] Date of Birth/Sex: Treating RN: 04-05-1981 (42 y.o. [name] Primary Care Provider: PA TIENT, NO Other Clinician: Referring Provider: Treating Provider/Extender: [name], [name], Referral Weeks in Treatment: 1 Subjective History of Present Illness (HPI) 42 year old patient well known to our [name] clinic where she has been seen since 2016 for bilateral lower extremity venous insufficiency disease with lymphedema and multiple ulcerations associated with morbid obesity. she had custom-made compression stockings and lymphedema  pumps which were used in the past. most recently she was admitted to the hospital between October 11 and 09/02/2017 with sepsis, lower extremity wounds and lymphedema.she was initially treated in the outpatient with Keflex and Bactrim. she was initially treated in the hospital with vancomycin and Zosyn and changed over to Unasyn until her white count improved and her blood cultures were negative for 3 days. After her inpatient management she was discharged home on Augmentin to end on 09/13/2017 with a 14 day course. she has had outpatient vascular duplex scans completed in November 2017 and her right ABI was 1.1 and the left ABI is 1.3. she had normal toe brachial indices bilaterally.she had three-vessel runoff in the right lower extremity and two-vessel runoff in the left lower extremity. On questioning the patient she does have custom made compression stockings and also has a lymphedema pump but has not been using it appropriately and has not been taking good care of herself. 09/17/2017 -- she returns today with compression stockings on the left side and the right side has had significant amount of drainage and has a very strong odor 09/24/2017 -- the drainage is increased significantly and she has more lymphedema and a very strong odor to her Jessica. Though she does not have systemic symptoms, or overt infection I believe she will benefit from some doxycycline given empirically. 10/01/2017 -- after starting the doxycycline and changing the dressing twice a week her symptoms and signs have definitely improved overall. 10/08/2017 -- she has completed her course of doxycycline but continues to have a lot of drainage and needs twice a week dressing changes. 11/08/17-she is here in follow-up evaluation for right lower extremity ulcers. She admits to using her lymphedema pumps twice daily, one hour per session. she is voicing no complaints or concerns, no signs of infection will change to  [facility] 12/14/17 on evaluation today patient appears to be doing very well in regard to her wounds. She has been tolerating the dressing changes she continues to develop some portly the adherent granular tissue on the surface of the Jessica with some Slough. Obviously we are trying to get too much better Jessica bed. With that being said the hyper granulation the Hydrofera Blue Dressing to have helped with which is excellent news. However I think it may be time to try something a little bit different at this point. 01/11/18 on evaluation today patient appears to be doing fairly well in regard to her right lateral lower extremity ulcers. This shows excellent signs of filling in which is great news. There does not appear to be any evidence of infection which is also good news. She does continue to work as well is good school. She is having no pain. 01/22/18 on evaluation today patient appears to be doing a little bit worse in my opinion in regard to the overall quality of that granulation on her right lower extremity. She was not  here last week due to being sick with a stomach virus this may have something to do with the fact that her Jessica appears to be a little bit worse. With that being said I'm also thinking that after switching from the [name] Dressing to the silver collagen would really has not looked that's good in my opinion. We may want to swit 02/11/18 on evaluation today patient's right lower/lateral lower extremity ulcers appear to be doing very well at this point. Especially the more proximal ulcer has filled in much closer to surface which is good news. Nonetheless both show signs of improvement which is great news. There does not appear to be any evidence of infection which is also good news. In general patient has been doing well tolerating the wraps as well as the [employer]. 02/18/18 on evaluation today patient appears to be doing a little bit worse in regard to the periwound  region the wounds themselves do not look much deteriorated to me. With that being said she has several small blisters/pustules noted in the periwound and there was a significant amount of drainage and maceration compared to previous. There has been a time that we had to bring her back for twice a week dressing changes as far as her wrap was concerned it has been a while since we've done that however. With that being said the patient has been having some burning and in general I'm concerned about the possibility of infection. She has previously taken doxycycline with good result. Fortunately there does not appear to be any evidence of overall worsening in regard to the size of the Jessica and in fact the upper Jessica actually appears to be showing signs of good epithelialization. 03/18/18 on evaluation today patient appears to be doing excellent in regard to her right lateral lower extremity ulcer. She has been tolerating the dressing changes without complication. Fortunately this seems to be making great progress. Overall I see no signs of infection and there is dramatic improvement overall even compared to last week. 03/28/18 on evaluation today patient appears to be doing very well in regard to her right lateral lower surety ulcer. She has been tolerating the dressing changes [place], [name] ([id number]) [id number].pdf Page 6 of 8 without complication at this point. She states currently that she's having no significant discomfort which is excellent as well. Overall I'm pleased with how things seem to be progressing. 04/01/18 on evaluation today patient appears to be doing excellent in regard to her right lateral lower extremity ulcers. She has been tolerating the dressing changes without complication which is good news. With that being said the wraps still continues to show signs of helping with her fluid she does have Juxta-Lite compression stockings for when we are done with the  current treatment regimen once everything heals. Mainly she just has the one area still remaining the smaller of the two wings is pretty much closed at this point there's just a very slight opening noted. 04/08/18 on evaluation today patient appears to be doing better in regard to the original Jessica on the right lateral lower extremity that we have been managing. Unfortunately she has a new area of weeping more anterior on the right lateral lower extremity and on the left lower extremity she has two new ulcers there appears to be some cellulitis noted at this time. I am concerned about the fact that this may in fact be an infection that has caused the worsening and swelling in the past  this has been the case when we previously attempted to determine what was going on when she had down slides like this. With that being said the patient is seeming to tolerate the wraps fairly well for the most part. 04/17/18; since last time the patient was seen in this clinic she was hospitalized from 04/11/18 through 04/13/18; she presented with bilateral lower extremity pain worse on the right and a fever of up to 104. Noteworthy that when she was in the clinic last week she had new wounds on the left leg culture grew MRSA and she was prescribed Bactrim. She had 2 days of IV Vanco and Zosyn in the hospital. Her blood cultures were negative. She was discharged on Bactrim to cover the original MRSA on the right leg and Keflex to cover the possibility of strep. The hospitalist had a conversation with infectious disease. The patient arrives in clinic today for nurse check however given the recent hospitalization I was asked to look at her. The patient states she feels a lot better. No fever or chills. Still some pain in the right calf but a lot better. She arrived in the hospital with a white count of 15.6, the next day was 10.8. Comprehensive metabolic panel was normal. She is still taking Keflex and Bactrim It would appear  that she had a surgical IandD at the bedside of the right calf felt to have a underlying abscess. According to our intake nurse the Jessica has expanded quite bit on the right lateral calf. She has no open area on the left anterior and left posterior calf as described last time 04/23/18 on evaluation today patient actually appears to be doing much better than when I last saw her. This obviously has been a couple weeks ago and in the interim she was admitted to the hospital for IV antibiotic therapy due to cellulitis, discharge, and fortunately the substance which hadn't sued has completely resolved. Her swelling seems to be better in regard to her lower extremities as well and the wounds that opened up as results of the infection seems to be showing signs of improvement. There is some Slough noted on the left lateral Jessica otherwise a lot of weeping noted of the right lateral leg. 04/29/18 on evaluation today patient appears to be doing excellent in regard to her bilateral lower extremity ulcers. She's been tolerating the dressing changes without complication there does not appear to be any evidence of infection at this time. Overall I think she is made great improvement and seems to be showing signs of coming back around where she was prior to the cellulitis and sepsis episode. 05/06/18 on evaluation today patient appears to be doing better in regard to her bilateral ocean the ulcers. She's been tolerating the dressing changes without complication. Fortunately there does not appear to be any evidence of infection at this time. Her swelling is significantly down overall seems to be showing signs of good improvement as well. Fortunately I do believe that she is progressing nicely back to where she was prior to the cellulitis/sepsis issue. 05/13/18 on evaluation today patient seems to be showing signs of great improvement she's been tolerating the dressing changes without complication and overall there does not  appear to be any evidence of infection. All of which is good news. The only issue she really have today is that some of the [name] Dressing actually was stuck to the periwound of the right lateral lower extremity however this was able to be removed  during the debridement without complication. 05/21/18 on evaluation today patient appears to be doing very well in regard to her bilateral lower extremity ulcers. She has been tolerating the compression wraps without complication. There does not appear to be any evidence of infection at this point which is good news as well. Overall I'm very pleased with the progress that has been made. She likewise is very happy. 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 05/28/18 on evaluation today patient actually appears to be doing excellent in regard to her bilateral lower [place] ulcers. She's made great progress since last week and overall I'm very pleased in this regard. She states that she's not have any discomfort either which is also good news there's definitely no evidence of infection. 06/03/18 on evaluation today patient actually appears to be doing excellent in regard to her ulcerations in fact these areas on both lower extremities were almost completely healed. Unfortunately she has a situation going on her life right now she actually found her husband deceased last 08/07/23. She states that she has been quite a bit upset since that time unfortunately this is obviously a very big blow to her. Nonetheless she is concerned about the funeral and having to wear certain shoes for this that she states she cannot fit in with the wraps. She wonders if she could potentially come back on 08/07/2023 to have her wraps changed and to switch into her Juxta-Lite compression at that point. I think that would be an okay thing that we could do for her. Nonetheless  she fortunately seems to be tolerating the wraps very well and again has made excellent progress with the [facility] Dressing 06/11/18 on evaluation today patient appears to be doing rather well in regard to her bilateral lower extremities in fact everything appears to be completely healed at this point which is excellent news. There does not appear to be any evidence of infection at this time which is great. In fact overall I do believe she is completely resolved in regard to her bilateral lower extremity ulcers. She is extremely happy in this regard. 06/27/2023 Ms. Starlynn Ermel is a 42 year old female with a past medical history of venous insufficiency and lymphedema that presents to the clinic for a 3-week history of nonhealing Jessica to the right lower extremity. She states this started spontaneously. She does not wear compression stockings. She has been keeping the area covered. She currently denies signs of infection. 8/14; patient readmitted to clinic last week. She has venous insufficiency and chronic secondary lymphedema. She has a nonhealing Jessica on the right lateral lower leg. She has been using gent mupirocin and Hydrofera Blue using an Urgo K2 lite wrap. Her ABI on the right leg was 0.74 on admission to clinic. Formal arterial studies were ordered but we have not yet had any contact with the patient. Also there was some question about juxta lites for this patient we have not heard anything about that either. Objective Constitutional Sitting or standing Blood Pressure is within target range for patient.. Pulse regular and within target range for patient.Marland Kitchen Respirations regular, non-labored and within target range.. Temperature is normal and within the target range for the patient.Marland Kitchen appears in no distress. Vitals Time Taken: 1:38 PM, Height: 73 in, Weight: 436 lbs, BMI: 57.5, Temperature: 98.1 F, Pulse: 100 bpm, Respiratory Rate: 18 breaths/min, Blood Pressure: 113/78 mmHg. [name], [name] ([id number])  [id number] Page 7 of 8 General Notes: Jessica exam; right lower extremity. Patient has a Jessica with a nonviable surface no visible healthy granulation. There is no evidence of surrounding infection. Her edema control is poor. She has venous stasis changes but clearly secondary lymphedema. Pedal pulses are reduced but palpable. Her foot is warm Integumentary (Hair, Skin) [name] status is Open. Original cause of Jessica was Gradually Appeared. The date acquired was: 06/06/2023. The Jessica has been in treatment 1 weeks. The Jessica is located on the Right,Lateral Lower Leg. The Jessica measures 2cm length x 2cm width x 0.3cm depth; 3.142cm^2 area and 0.942cm^3 volume. There is Fat Layer (Subcutaneous Tissue) exposed. There is a large amount of purulent drainage noted. There is small (1-33%) red, pink granulation within the Jessica bed. There is a medium (34-66%) amount of necrotic tissue within the Jessica bed including Adherent Slough. 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 There was a Three Layer Compression Therapy Procedure by Midge Aver, RN. Post procedure Diagnosis [name]: Same as Pre-Procedure Plan Follow-up Appointments: Return Appointment in 1 week. Bathing/ Shower/ Hygiene: May shower with Jessica dressing protected with water repellent cover or cast protector. No tub bath. Anesthetic (Use 'Patient Medications' Section for Anesthetic Order Entry): Lidocaine applied to Jessica bed Edema Control - Lymphedema / Segmental Compressive Device / Other: UrgoK2 LITE - Large [name]: - Lower Leg Jessica Laterality: Right, Lateral Cleanser: Soap  and Water 1 x Per Week/30 Days Discharge Instructions: Gently cleanse Jessica with antibacterial soap, rinse and pat dry prior to dressing wounds Cleanser: Vashe 5.8 (oz) 1 x Per Week/30 Days Discharge Instructions: Use vashe 5.8 (oz) as directed Topical: Gentamicin 1 x Per Week/30 Days Discharge Instructions: Apply as directed by provider. Topical: Mupirocin Ointment 1 x Per Week/30 Days Discharge Instructions: Apply as directed by provider. Prim Dressing: Hydrofera Blue Ready Transfer Foam, 2.5x2.5 (in/in) 1 x Per Week/30 Days ary Discharge Instructions: Apply Hydrofera Blue Ready to Jessica bed as directed Secondary Dressing: ABD Pad 5x9 (in/in) 1 x Per Week/30 Days Discharge Instructions: Cover with ABD pad Com pression Wrap: Urgo K2 Lite, two layer compression system, large 1 x Per Week/30 Days 1. We are awaiting arterial studies 2. I did not change the current dressing which is topical antibiotics with Hydrofera Blue using and Urgo K2 lite wrap. Unfortunately this is not providing adequate compression here 3. The Jessica requires debridement however with the amount of swelling in her leg that is likely to just result in fluid leaking out through the Jessica at this point. However when we get to a point where the edema is better controlled this is definitely going to require debridement. Electronic Signature(s) Signed: 07/04/2023 4:20:15 PM By: Baltazar Najjar MD Entered By: Baltazar Najjar on 07/04/2023 14:07:45 [name] ([id number]) [id number] Page 8 of 8 -------------------------------------------------------------------------------- SuperBill Details Patient Name: Date of Service: TREBA, BECKIUS 07/04/2023 Medical Record Number: [id number] Patient Account Number: [id number] Date of Birth/Sex: Treating RN: June 07, 1981 (42 y.o. [name] Primary Care Provider: PA Zenovia Jordan, NO Other Clinician: Referring Provider: Treating Provider/Extender: [name],  [name], Referral Weeks in Treatment: 1 Diagnosis Coding ICD-10 Codes Code Description [phone] Non-pressure chronic ulcer of other part  of right lower leg with fat layer exposed I87.311 Chronic venous hypertension (idiopathic) with ulcer of right lower extremity I89.0 Lymphedema, not elsewhere classified Facility Procedures : CPT4 Code: 16109604 Description: (Facility Use Only) [id number] - APPLY MULTLAY COMPRS LWR RT LEG Modifier: Quantity: 1 Physician Procedures : CPT4 Code Description Modifier [id number] - WC PHYS LEVEL 3 - EST PT ICD-10 Diagnosis Description L97.812 Non-pressure chronic ulcer of other part of right lower leg with fat layer exposed I87.311 Chronic venous hypertension (idiopathic) with ulcer  of right lower extremity I89.0 Lymphedema, not elsewhere classified Quantity: 1 Electronic Signature(s) Signed: 07/04/2023 4:09:30 PM By: Midge Aver MSN RN CNS WTA Signed: 07/04/2023 4:20:15 PM By: Baltazar Najjar MD Entered By: Midge Aver on 07/04/2023 14:18:06

## 2023-07-11 ENCOUNTER — Ambulatory Visit: Payer: Self-pay | Admitting: Internal Medicine

## 2023-07-13 ENCOUNTER — Ambulatory Visit (INDEPENDENT_AMBULATORY_CARE_PROVIDER_SITE_OTHER): Payer: 59

## 2023-07-13 DIAGNOSIS — L97919 Non-pressure chronic ulcer of unspecified part of right lower leg with unspecified severity: Secondary | ICD-10-CM

## 2023-07-13 DIAGNOSIS — I87311 Chronic venous hypertension (idiopathic) with ulcer of right lower extremity: Secondary | ICD-10-CM

## 2023-07-13 DIAGNOSIS — L97812 Non-pressure chronic ulcer of other part of right lower leg with fat layer exposed: Secondary | ICD-10-CM | POA: Diagnosis not present

## 2023-07-13 DIAGNOSIS — I890 Lymphedema, not elsewhere classified: Secondary | ICD-10-CM | POA: Diagnosis not present

## 2023-07-18 ENCOUNTER — Encounter (HOSPITAL_BASED_OUTPATIENT_CLINIC_OR_DEPARTMENT_OTHER): Payer: 59 | Admitting: Internal Medicine

## 2023-07-18 DIAGNOSIS — I87311 Chronic venous hypertension (idiopathic) with ulcer of right lower extremity: Secondary | ICD-10-CM

## 2023-07-18 DIAGNOSIS — I890 Lymphedema, not elsewhere classified: Secondary | ICD-10-CM

## 2023-07-18 DIAGNOSIS — L97812 Non-pressure chronic ulcer of other part of right lower leg with fat layer exposed: Secondary | ICD-10-CM

## 2023-07-18 NOTE — Progress Notes (Signed)
DIAHANN, LYND [name] ([id number]) [id number] Page 1 of 10 Visit Report for 07/18/2023 Chief Complaint Document Details Patient Name: Date of Service: [name], [name] 07/18/2023 9:30 A M Medical Record Number: [id number] Patient Account Number: [id number] Date of Birth/Sex: Treating RN: Feb 02, 1981 (42 y.o. Starleen Arms, Leah Primary Care Provider: PA Zenovia Jordan, [place] Other Clinician: Referring Provider: Treating Provider/Extender: Geralyn Corwin Self, Referral Weeks in Treatment: 3 Information Obtained from: Patient Chief Complaint 06/27/2023; right lower extremity wound Electronic Signature(s) Signed: 07/18/2023 11:48:03 AM By: Geralyn Corwin DO Entered By: Geralyn Corwin on 07/18/2023 10:03:20 -------------------------------------------------------------------------------- Debridement Details Patient Name: Date of Service: [name], [name] 07/18/2023 9:30 A M Medical Record Number: [id number] Patient Account Number: [id number] Date of Birth/Sex: Treating RN: [birth year] (42 y.o. Starleen Arms, Leah Primary Care Provider: PA Zenovia Jordan, NO Other Clinician: Referring Provider: Treating Provider/Extender: Geralyn Corwin Self, Referral Weeks in Treatment: 3 Debridement Performed for Assessment: Wound #7 Right,Lateral Lower Leg Performed By: Physician Geralyn Corwin, MD Debridement Type: Debridement Severity of Tissue Pre Debridement: Fat layer exposed Level of Consciousness (Pre-procedure): Awake and Alert Pre-procedure Verification/Time Out Yes - 09:55 Taken: Start Time: 09:55 Pain Control: Lidocaine 2% T opical Gel Percent of Wound Bed Debrided: 100% T Area Debrided (cm): otal 2.27 Tissue and other material debrided: Viable, Non-Viable, Slough, Subcutaneous, Slough Level: Skin/Subcutaneous Tissue Debridement Description: Excisional Instrument: Curette Bleeding: Minimum Hemostasis Achieved: Pressure End Time: 09:57 Procedural Pain: 0 [name], [name] ([id number])  [id number] Page 2 of 10 Post Procedural Pain: 0 Response to Treatment: Procedure was tolerated well Level of Consciousness (Post- Awake and Alert procedure): Post Debridement Measurements of Total Wound Length: (cm) 1.7 Width: (cm) 1.7 Depth: (cm) 0.2 Volume: (cm) 0.454 Character of Wound/Ulcer Post Debridement: Improved Severity of Tissue Post Debridement: Fat layer exposed Post Procedure Diagnosis Same as Pre-procedure Electronic Signature(s) Signed: 07/18/2023 11:48:03 AM By: Geralyn Corwin DO Signed: 07/18/2023 5:16:29 PM By: Bonnell Public Entered By: Bonnell Public on 07/18/2023 09:58:24 -------------------------------------------------------------------------------- HPI Details Patient Name: Date of Service: [name], [name] 07/18/2023 9:30 A M Medical Record Number: [id number] Patient Account Number: [id number] Date of Birth/Sex: Treating RN: 1981-07-09 (42 y.o. Starleen Arms, Leah Primary Care Provider: PA Zenovia Jordan, NO Other Clinician: Referring Provider: Treating Provider/Extender: Geralyn Corwin Self, Referral Weeks in Treatment: 3 History of Present Illness HPI Description: 42 year old patient well known to our [facility] wound care clinic where she has been seen since 2016 for bilateral lower extremity venous insufficiency disease with lymphedema and multiple ulcerations associated with morbid obesity. she had custom-made compression stockings and lymphedema pumps which were used in the past. most recently she was admitted to the hospital between October 11 and 09/02/2017 with sepsis, lower extremity wounds and lymphedema.she was initially treated in the outpatient with Keflex and Bactrim. she was initially treated in the hospital with vancomycin and Zosyn and changed [name] to Unasyn until her white count improved and her blood cultures were negative for 3 days. After her inpatient management she was discharged home on Augmentin to end on  09/13/2017 with a 14 day course. she has had outpatient vascular duplex scans completed in November 2017 and her right ABI was 1.1 and the left ABI is 1.3. she had normal toe brachial indices bilaterally.she had three-vessel runoff in the right lower extremity and two-vessel runoff in the left lower extremity. On questioning the patient she does have custom made compression stockings and also has a lymphedema pump but has not been using it appropriately and has not been taking good care of  herself. 09/17/2017 -- she returns today with compression stockings on the left side and the right side has had significant amount of drainage and has a very strong odor 09/24/2017 -- the drainage is increased significantly and she has more lymphedema and a very strong odor to her wound. Though she does not have systemic symptoms, or overt infection I believe she will benefit from some doxycycline given empirically. 10/01/2017 -- after starting the doxycycline and changing the dressing twice a week her symptoms and signs have definitely improved overall. 10/08/2017 -- she has completed her course of doxycycline but continues to have a lot of drainage and needs twice a week dressing changes. 11/08/17-she is here in follow-up evaluation for right lower extremity ulcers. She admits to using her lymphedema pumps twice daily, one hour per session. she is voicing no complaints or concerns, no signs of infection will change to [facility] 12/14/17 on evaluation today patient appears to be doing very well in regard to her wounds. She has been tolerating the dressing changes she continues to develop some portly the adherent granular tissue on the surface of the wound with some Slough. Obviously we are trying to get too much better wound bed. With that being said the hyper granulation the Hydrofera Blue Dressing to have helped with which is excellent news. However I think it may be time to try something a little bit different at  this point. 01/11/18 on evaluation today patient appears to be doing fairly well in regard to her right lateral lower extremity ulcers. This shows excellent signs of filling in which is great news. There does not appear to be any evidence of infection which is also good news. She does continue to work as well is good school. She is having no pain. [name], [name] ([id number]) [id number] Page 3 of 10 01/22/18 on evaluation today patient appears to be doing a little bit worse in my opinion in regard to the overall quality of that granulation on her right lower extremity. She was not here last week due to being sick with a stomach virus this may have something to do with the fact that her wound appears to be a little bit worse. With that being said I'm also thinking that after switching from the [facility] Dressing to the silver collagen would really has not looked that's good in my opinion. We may want to swit 02/11/18 on evaluation today patient's right lower/lateral lower extremity ulcers appear to be doing very well at this point. Especially the more proximal ulcer has filled in much closer to surface which is good news. Nonetheless both show signs of improvement which is great news. There does not appear to be any evidence of infection which is also good news. In general patient has been doing well tolerating the wraps as well as the [employer]. 02/18/18 on evaluation today patient appears to be doing a little bit worse in regard to the periwound region the wounds themselves do not look much deteriorated to me. With that being said she has several small blisters/pustules noted in the periwound and there was a significant amount of drainage and maceration compared to previous. There has been a time that we had to bring her back for twice a week dressing changes as far as her wrap was concerned it has been a while since we've done that however. With that being  said the patient has been having some burning and in general I'm concerned about the possibility of infection. She  has previously taken doxycycline with good result. Fortunately there does not appear to be any evidence of overall worsening in regard to the size of the wound and in fact the upper wound actually appears to be showing signs of good epithelialization. 03/18/18 on evaluation today patient appears to be doing excellent in regard to her right lateral lower extremity ulcer. She has been tolerating the dressing changes without complication. Fortunately this seems to be making great progress. Overall I see no signs of infection and there is dramatic improvement overall even compared to last week. 03/28/18 on evaluation today patient appears to be doing very well in regard to her right lateral lower surety ulcer. She has been tolerating the dressing changes without complication at this point. She states currently that she's having no significant discomfort which is excellent as well. Overall I'm pleased with how things seem to be progressing. 04/01/18 on evaluation today patient appears to be doing excellent in regard to her right lateral lower extremity ulcers. She has been tolerating the dressing changes without complication which is good news. With that being said the wraps still continues to show signs of helping with her fluid she does have Juxta-Lite compression stockings for when we are done with the current treatment regimen once everything heals. Mainly she just has the one area still remaining the smaller of the two wings is pretty much closed at this point there's just a very slight opening noted. 04/08/18 on evaluation today patient appears to be doing better in regard to the original wound on the right lateral lower extremity that we have been managing. Unfortunately she has a new area of weeping more anterior on the right lateral lower extremity and on the left lower extremity she has  two new ulcers there appears to be some cellulitis noted at this time. I am concerned about the fact that this may in fact be an infection that has caused the worsening and swelling in the past this has been the case when we previously attempted to determine what was going on when she had down slides like this. With that being said the patient is seeming to tolerate the wraps fairly well for the most part. 04/17/18; since last time the patient was seen in this clinic she was hospitalized from 04/11/18 through 04/13/18; she presented with bilateral lower extremity pain worse on the right and a fever of up to 104. Noteworthy that when she was in the clinic last week she had new wounds on the left leg culture grew MRSA and she was prescribed Bactrim. She had 2 days of IV Vanco and Zosyn in the hospital. Her blood cultures were negative. She was discharged on Bactrim to cover the original MRSA on the right leg and Keflex to cover the possibility of strep. The hospitalist had a conversation with infectious disease. The patient arrives in clinic today for nurse check however given the recent hospitalization I was asked to look at her. The patient states she feels a lot better. No fever or chills. Still some pain in the right calf but a lot better. She arrived in the hospital with a white count of 15.6, the next day was 10.8. Comprehensive metabolic panel was normal. She is still taking Keflex and Bactrim It would appear that she had a surgical IandD at the bedside of the right calf felt to have a underlying abscess. According to our intake nurse the wound has expanded quite bit on the right lateral calf. She has no open  area on the left anterior and left posterior calf as described last time 04/23/18 on evaluation today patient actually appears to be doing much better than when I last saw her. This obviously has been a couple weeks ago and in the interim she was admitted to the hospital for IV antibiotic therapy  due to cellulitis, discharge, and fortunately the substance which hadn't sued has completely resolved. Her swelling seems to be better in regard to her lower extremities as well and the wounds that opened up as results of the infection seems to be showing signs of improvement. There is some Slough noted on the left lateral wound otherwise a lot of weeping noted of the right lateral leg. 04/29/18 on evaluation today patient appears to be doing excellent in regard to her bilateral lower extremity ulcers. She's been tolerating the dressing changes without complication there does not appear to be any evidence of infection at this time. Overall I think she is made great improvement and seems to be showing signs of coming back around where she was prior to the cellulitis and sepsis episode. 05/06/18 on evaluation today patient appears to be doing better in regard to her bilateral ocean the ulcers. She's been tolerating the dressing changes without complication. Fortunately there does not appear to be any evidence of infection at this time. Her swelling is significantly down overall seems to be showing signs of good improvement as well. Fortunately I do believe that she is progressing nicely back to where she was prior to the cellulitis/sepsis issue. 05/13/18 on evaluation today patient seems to be showing signs of great improvement she's been tolerating the dressing changes without complication and overall there does not appear to be any evidence of infection. All of which is good news. The only issue she really have today is that some of the [facility] Dressing actually was stuck to the periwound of the right lateral lower extremity however this was able to be removed during the debridement without complication. 05/21/18 on evaluation today patient appears to be doing very well in regard to her bilateral lower extremity ulcers. She has been tolerating the compression wraps without complication. There does  not appear to be any evidence of infection at this point which is good news as well. Overall I'm very pleased with the progress that has been made. She likewise is very happy. 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 05/28/18 on evaluation today patient actually appears to be doing excellent in regard to her bilateral lower [place] ulcers. She's made great progress since last week and overall I'm very pleased in this regard. She states that she's not have any discomfort either which is also good news there's definitely no evidence of infection. 06/03/18 on evaluation today patient actually appears to be doing excellent in regard to her ulcerations in fact these areas on both lower extremities were almost completely healed. Unfortunately she has a situation going on her life right now she actually found her husband deceased last 08-05-2023. She states that she has been quite a bit upset since that time unfortunately this is obviously a very big blow to her. Nonetheless she is concerned about the funeral and having to wear certain shoes for this that she states she cannot fit in with the wraps. She wonders if she could potentially come back on 2023/08/05 to have her wraps changed and to switch into her Juxta-Lite  compression at that point. I think that would be an okay thing that we could do for her. Nonetheless she fortunately seems to be tolerating the wraps very well and again has made excellent progress with the [facility] Dressing 06/11/18 on evaluation today patient appears to be doing rather well in regard to her bilateral lower extremities in fact everything appears to be completely healed at this point which is excellent news. There does not appear to be any evidence of infection at this time which is great. In fact overall I do believe she is completely resolved in regard to her  bilateral lower extremity ulcers. She is extremely happy in this regard. 06/27/2023 Ms. Maddison Weaverling is a 42 year old female with a past medical history of venous insufficiency and lymphedema that presents to the clinic for a 3-week history of nonhealing wound to the right lower extremity. She states this started spontaneously. She does not wear compression stockings. She has been keeping the area covered. She currently denies signs of infection. 8/14; patient readmitted to clinic last week. She has venous insufficiency and chronic secondary lymphedema. She has a nonhealing wound on the right lateral Mabile, Tkeya [name] ([id number]) [id number] Page 4 of 10 lower leg. She has been using gent mupirocin and Hydrofera Blue using an Urgo K2 lite wrap. Her ABI on the right leg was 0.74 on admission to clinic. Formal arterial studies were ordered but we have not yet had any contact with the patient. Also there was some question about juxta lites for this patient we have not heard anything about that either. 8/28; patient presents for follow-up. She had ABIs completed on 8/23 that showed noncompressible ABIs on the right and left with TBI of 0.88 on the left and 0.96 on the right. She had triphasic waveforms throughout the arterial system. This is reassuring that she has adequate blood flow for healing. We have been using antibiotic ointment with Hydrofera Blue under Urgo K2 lite wrap. She has tolerated this well. Wound is smaller. Electronic Signature(s) Signed: 07/18/2023 11:48:03 AM By: Geralyn Corwin DO Entered By: Geralyn Corwin on 07/18/2023 10:04:35 -------------------------------------------------------------------------------- Physical Exam Details Patient Name: Date of Service: [name], [name] 07/18/2023 9:30 A M Medical Record Number: [id number] Patient Account Number: [id number] Date of Birth/Sex: Treating RN: 1981/07/26 (42 y.o. Starleen Arms, Leah Primary Care Provider:  PA Zenovia Jordan, [place] Other Clinician: Referring Provider: Treating Provider/Extender: Geralyn Corwin Self, Referral Weeks in Treatment: 3 Constitutional . Cardiovascular . Psychiatric . Notes T the right lower extremity on the lateral aspect there is an open wound with nonviable tissue. Postdebridement there is healthier granulation tissue present. o Poor edema control. Venous stasis dermatitis. Electronic Signature(s) Signed: 07/18/2023 11:48:03 AM By: Geralyn Corwin DO Entered By: Geralyn Corwin on 07/18/2023 10:05:28 -------------------------------------------------------------------------------- Physician Orders Details Patient Name: Date of Service: [name], Ameris [name]. 07/18/2023 9:30 A M Medical Record Number: [id number] Patient Account Number: [id number] Date of Birth/Sex: Treating RN: 1981/06/18 (42 y.o. Starleen Arms, [place] Primary Care Provider: PA Zenovia Jordan, [place] Other Clinician: Referring Provider: Treating Provider/Extender: Geralyn Corwin Self, Referral Weeks in Treatment: 3 Daughety, Daria [name] ([id number]) [id number] Page 5 of 10 Verbal / Phone Orders: No Diagnosis Coding Follow-up Appointments Return Appointment in 1 week. ppointment in: - nurse visit (one time only) 8/30 Return A Bathing/ Shower/ Hygiene May shower with wound dressing protected with water repellent cover or cast protector. No tub bath. Anesthetic (Use 'Patient Medications' Section for Anesthetic Order Entry) Lidocaine applied to wound bed Edema Control - Lymphedema /  Segmental Compressive Device / Other Optional: One layer of unna paste to top of compression wrap (to act as an anchor). UrgoK2 Wound Treatment Wound #7 - Lower Leg Wound Laterality: Right, Lateral Cleanser: Soap and Water 2 x Per Week/30 Days Discharge Instructions: Gently cleanse wound with antibacterial soap, rinse and pat dry prior to dressing wounds Cleanser: Vashe 5.8 (oz) 2 x Per Week/30  Days Discharge Instructions: Use vashe 5.8 (oz) as directed Topical: Gentamicin 2 x Per Week/30 Days Discharge Instructions: Apply as directed by provider. Topical: Mupirocin Ointment 2 x Per Week/30 Days Discharge Instructions: Apply as directed by provider. Prim Dressing: Hydrofera Blue Ready Transfer Foam, 2.5x2.5 (in/in) 2 x Per Week/30 Days ary Discharge Instructions: Apply Hydrofera Blue Ready to wound bed as directed Secondary Dressing: ABD Pad 5x9 (in/in) 2 x Per Week/30 Days Discharge Instructions: Cover with ABD pad Compression Wrap: Urgo K2, two layer compression system, large 2 x Per Week/30 Days Electronic Signature(s) Signed: 07/18/2023 11:48:03 AM By: Geralyn Corwin DO Signed: 07/18/2023 5:16:29 PM By: Bonnell Public Entered By: Bonnell Public on 07/18/2023 10:12:49 -------------------------------------------------------------------------------- Problem List Details Patient Name: Date of Service: [name], [name] 07/18/2023 9:30 A M Medical Record Number: [id number] Patient Account Number: [id number] Date of Birth/Sex: Treating RN: 1980-12-19 (42 y.o. Starleen Arms, Leah Primary Care Provider: PA Zenovia Jordan, [place] Other Clinician: Referring Provider: Treating Provider/Extender: Geralyn Corwin Self, Referral Weeks in Treatment: 3 Active Problems ICD-10 Encounter Code Description Active Date MDM Diagnosis DANIAL, FLIPPIN [name] ([id number]) [id number] Page 6 of 10 [phone] Non-pressure chronic ulcer of other part of right lower leg with fat layer 06/27/2023 No Yes exposed I87.311 Chronic venous hypertension (idiopathic) with ulcer of right lower extremity 06/27/2023 No Yes I89.0 Lymphedema, not elsewhere classified 06/27/2023 No Yes Inactive Problems Resolved Problems Electronic Signature(s) Signed: 07/18/2023 11:48:03 AM By: Geralyn Corwin DO Entered By: Geralyn Corwin on 07/18/2023  10:02:54 -------------------------------------------------------------------------------- Progress Note Details Patient Name: Date of Service: [name], Raenette [name]. 07/18/2023 9:30 A M Medical Record Number: [id number] Patient Account Number: [id number] Date of Birth/Sex: Treating RN: 01-15-1981 (42 y.o. Starleen Arms, Leah Primary Care Provider: PA TIENT, NO Other Clinician: Referring Provider: Treating Provider/Extender: Geralyn Corwin Self, Referral Weeks in Treatment: 3 Subjective Chief Complaint Information obtained from Patient 06/27/2023; right lower extremity wound History of Present Illness (HPI) 42 year old patient well known to our [facility] wound care clinic where she has been seen since 2016 for bilateral lower extremity venous insufficiency disease with lymphedema and multiple ulcerations associated with morbid obesity. she had custom-made compression stockings and lymphedema pumps which were used in the past. most recently she was admitted to the hospital between October 11 and 09/02/2017 with sepsis, lower extremity wounds and lymphedema.she was initially treated in the outpatient with Keflex and Bactrim. she was initially treated in the hospital with vancomycin and Zosyn and changed [name] to Unasyn until her white count improved and her blood cultures were negative for 3 days. After her inpatient management she was discharged home on Augmentin to end on 09/13/2017 with a 14 day course. she has had outpatient vascular duplex scans completed in November 2017 and her right ABI was 1.1 and the left ABI is 1.3. she had normal toe brachial indices bilaterally.she had three-vessel runoff in the right lower extremity and two-vessel runoff in the left lower extremity. On questioning the patient she does have custom made compression stockings and also has a lymphedema pump but has not been using it appropriately and has not been taking good care  of herself. 09/17/2017 -- she returns today  with compression stockings on the left side and the right side has had significant amount of drainage and has a very strong odor 09/24/2017 -- the drainage is increased significantly and she has more lymphedema and a very strong odor to her wound. Though she does not have systemic symptoms, or overt infection I believe she will benefit from some doxycycline given empirically. 10/01/2017 -- after starting the doxycycline and changing the dressing twice a week her symptoms and signs have definitely improved overall. 10/08/2017 -- she has completed her course of doxycycline but continues to have a lot of drainage and needs twice a week dressing changes. 11/08/17-she is here in follow-up evaluation for right lower extremity ulcers. She admits to using her lymphedema pumps twice daily, one hour per session. she is voicing no complaints or concerns, no signs of infection will change to [facility] 12/14/17 on evaluation today patient appears to be doing very well in regard to her wounds. She has been tolerating the dressing changes she continues to develop some portly the adherent granular tissue on the surface of the wound with some Slough. Obviously we are trying to get too much better wound bed. With that being said the hyper granulation the Hydrofera Blue Dressing to have helped with which is excellent news. However I think it may be time to try [name], [name] ([id number]) [id number] Page 7 of 10 something a little bit different at this point. 01/11/18 on evaluation today patient appears to be doing fairly well in regard to her right lateral lower extremity ulcers. This shows excellent signs of filling in which is great news. There does not appear to be any evidence of infection which is also good news. She does continue to work as well is good school. She is having no pain. 01/22/18 on evaluation today patient appears to be doing a little bit worse in my opinion in regard to the  overall quality of that granulation on her right lower extremity. She was not here last week due to being sick with a stomach virus this may have something to do with the fact that her wound appears to be a little bit worse. With that being said I'm also thinking that after switching from the [facility] Dressing to the silver collagen would really has not looked that's good in my opinion. We may want to swit 02/11/18 on evaluation today patient's right lower/lateral lower extremity ulcers appear to be doing very well at this point. Especially the more proximal ulcer has filled in much closer to surface which is good news. Nonetheless both show signs of improvement which is great news. There does not appear to be any evidence of infection which is also good news. In general patient has been doing well tolerating the wraps as well as the [employer]. 02/18/18 on evaluation today patient appears to be doing a little bit worse in regard to the periwound region the wounds themselves do not look much deteriorated to me. With that being said she has several small blisters/pustules noted in the periwound and there was a significant amount of drainage and maceration compared to previous. There has been a time that we had to bring her back for twice a week dressing changes as far as her wrap was concerned it has been a while since we've done that however. With that being said the patient has been having some burning and in general I'm concerned about the possibility of infection.  She has previously taken doxycycline with good result. Fortunately there does not appear to be any evidence of overall worsening in regard to the size of the wound and in fact the upper wound actually appears to be showing signs of good epithelialization. 03/18/18 on evaluation today patient appears to be doing excellent in regard to her right lateral lower extremity ulcer. She has been tolerating the dressing changes  without complication. Fortunately this seems to be making great progress. Overall I see no signs of infection and there is dramatic improvement overall even compared to last week. 03/28/18 on evaluation today patient appears to be doing very well in regard to her right lateral lower surety ulcer. She has been tolerating the dressing changes without complication at this point. She states currently that she's having no significant discomfort which is excellent as well. Overall I'm pleased with how things seem to be progressing. 04/01/18 on evaluation today patient appears to be doing excellent in regard to her right lateral lower extremity ulcers. She has been tolerating the dressing changes without complication which is good news. With that being said the wraps still continues to show signs of helping with her fluid she does have Juxta-Lite compression stockings for when we are done with the current treatment regimen once everything heals. Mainly she just has the one area still remaining the smaller of the two wings is pretty much closed at this point there's just a very slight opening noted. 04/08/18 on evaluation today patient appears to be doing better in regard to the original wound on the right lateral lower extremity that we have been managing. Unfortunately she has a new area of weeping more anterior on the right lateral lower extremity and on the left lower extremity she has two new ulcers there appears to be some cellulitis noted at this time. I am concerned about the fact that this may in fact be an infection that has caused the worsening and swelling in the past this has been the case when we previously attempted to determine what was going on when she had down slides like this. With that being said the patient is seeming to tolerate the wraps fairly well for the most part. 04/17/18; since last time the patient was seen in this clinic she was hospitalized from 04/11/18 through 04/13/18; she  presented with bilateral lower extremity pain worse on the right and a fever of up to 104. Noteworthy that when she was in the clinic last week she had new wounds on the left leg culture grew MRSA and she was prescribed Bactrim. She had 2 days of IV Vanco and Zosyn in the hospital. Her blood cultures were negative. She was discharged on Bactrim to cover the original MRSA on the right leg and Keflex to cover the possibility of strep. The hospitalist had a conversation with infectious disease. The patient arrives in clinic today for nurse check however given the recent hospitalization I was asked to look at her. The patient states she feels a lot better. No fever or chills. Still some pain in the right calf but a lot better. She arrived in the hospital with a white count of 15.6, the next day was 10.8. Comprehensive metabolic panel was normal. She is still taking Keflex and Bactrim It would appear that she had a surgical IandD at the bedside of the right calf felt to have a underlying abscess. According to our intake nurse the wound has expanded quite bit on the right lateral calf. She has  no open area on the left anterior and left posterior calf as described last time 04/23/18 on evaluation today patient actually appears to be doing much better than when I last saw her. This obviously has been a couple weeks ago and in the interim she was admitted to the hospital for IV antibiotic therapy due to cellulitis, discharge, and fortunately the substance which hadn't sued has completely resolved. Her swelling seems to be better in regard to her lower extremities as well and the wounds that opened up as results of the infection seems to be showing signs of improvement. There is some Slough noted on the left lateral wound otherwise a lot of weeping noted of the right lateral leg. 04/29/18 on evaluation today patient appears to be doing excellent in regard to her bilateral lower extremity ulcers. She's been  tolerating the dressing changes without complication there does not appear to be any evidence of infection at this time. Overall I think she is made great improvement and seems to be showing signs of coming back around where she was prior to the cellulitis and sepsis episode. 05/06/18 on evaluation today patient appears to be doing better in regard to her bilateral ocean the ulcers. She's been tolerating the dressing changes without complication. Fortunately there does not appear to be any evidence of infection at this time. Her swelling is significantly down overall seems to be showing signs of good improvement as well. Fortunately I do believe that she is progressing nicely back to where she was prior to the cellulitis/sepsis issue. 05/13/18 on evaluation today patient seems to be showing signs of great improvement she's been tolerating the dressing changes without complication and overall there does not appear to be any evidence of infection. All of which is good news. The only issue she really have today is that some of the [facility] Dressing actually was stuck to the periwound of the right lateral lower extremity however this was able to be removed during the debridement without complication. 05/21/18 on evaluation today patient appears to be doing very well in regard to her bilateral lower extremity ulcers. She has been tolerating the compression wraps without complication. There does not appear to be any evidence of infection at this point which is good news as well. Overall I'm very pleased with the progress that has been made. She likewise is very happy. 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 05/28/18 on evaluation today patient actually appears to be doing excellent in regard to her bilateral lower [place] ulcers. She's made great progress since last week and overall I'm very  pleased in this regard. She states that she's not have any discomfort either which is also good news there's definitely no evidence of infection. 06/03/18 on evaluation today patient actually appears to be doing excellent in regard to her ulcerations in fact these areas on both lower extremities were almost completely healed. Unfortunately she has a situation going on her life right now she actually found her husband deceased last 2023/08/24. She states that she has been quite a bit upset since that time unfortunately this is obviously a very big blow to her. Nonetheless she is concerned about the funeral and having to wear certain shoes for this that she states she cannot fit in with the wraps. She wonders if she could potentially come back on 08/24/2023 to have her wraps changed and to switch into  her Juxta-Lite compression at that point. I think that would be an okay thing that we could do for her. Nonetheless she fortunately seems to be tolerating the wraps very well and again has made excellent progress with the [facility] Dressing 06/11/18 on evaluation today patient appears to be doing rather well in regard to her bilateral lower extremities in fact everything appears to be completely healed at this point which is excellent news. There does not appear to be any evidence of infection at this time which is great. In fact overall I do believe she is completely resolved in regard to her bilateral lower extremity ulcers. She is extremely happy in this regard. 06/27/2023 [name] ([id number]) [id number] Page 8 of 10 Ms. Cynara Aguillard is a 42 year old female with a past medical history of venous insufficiency and lymphedema that presents to the clinic for a 3-week history of nonhealing wound to the right lower extremity. She states this started spontaneously. She does not wear compression stockings. She has been keeping the area covered. She currently denies signs of  infection. 8/14; patient readmitted to clinic last week. She has venous insufficiency and chronic secondary lymphedema. She has a nonhealing wound on the right lateral lower leg. She has been using gent mupirocin and Hydrofera Blue using an Urgo K2 lite wrap. Her ABI on the right leg was 0.74 on admission to clinic. Formal arterial studies were ordered but we have not yet had any contact with the patient. Also there was some question about juxta lites for this patient we have not heard anything about that either. 8/28; patient presents for follow-up. She had ABIs completed on 8/23 that showed noncompressible ABIs on the right and left with TBI of 0.88 on the left and 0.96 on the right. She had triphasic waveforms throughout the arterial system. This is reassuring that she has adequate blood flow for healing. We have been using antibiotic ointment with Hydrofera Blue under Urgo K2 lite wrap. She has tolerated this well. Wound is smaller. Objective Constitutional Vitals Time Taken: 9:41 AM, Height: 73 in, Weight: 436 lbs, BMI: 57.5, Temperature: 98.0 F, Pulse: 84 bpm, Respiratory Rate: 18 breaths/min, Blood Pressure: 114/77 mmHg. General Notes: T the right lower extremity on the lateral aspect there is an open wound with nonviable tissue. Postdebridement there is healthier granulation o tissue present. Poor edema control. Venous stasis dermatitis. Integumentary (Hair, Skin) Wound #7 status is Open. Original cause of wound was Gradually Appeared. The date acquired was: 06/06/2023. The wound has been in treatment 3 weeks. The wound is located on the Right,Lateral Lower Leg. The wound measures 1.7cm length x 1.7cm width x 0.2cm depth; 2.27cm^2 area and 0.454cm^3 volume. There is Fat Layer (Subcutaneous Tissue) exposed. There is a none present amount of drainage noted. There is small (1-33%) red, pink granulation within the wound bed. There is a medium (34-66%) amount of necrotic tissue within the wound  bed including Adherent Slough. 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 No signs of infection. I debrided nonviable tissue. Patient should have adequate blood flow for healing based on ABI results. I recommended increasing the compression wrap to equivalent of 4-layer. Continue Hydrofera Blue and antibiotic ointment under the wrap. She will come in for nurse visit later in the week  to assure that the wrap did not slide down. I will see her in 1 week. Procedures Wound #7 Pre-procedure diagnosis of Wound #7 is a Venous Leg Ulcer located on the Right,Lateral Lower Leg .Severity of Tissue Pre Debridement is: Fat layer exposed. There was a Excisional Skin/Subcutaneous Tissue Debridement with a total area of 2.27 sq cm performed by Geralyn Corwin, MD. With the following instrument(s): Curette to remove Viable and Non-Viable tissue/material. Material removed includes Subcutaneous Tissue and Slough and after achieving pain control using Lidocaine 2% T opical Gel. No specimens were taken. A time out was conducted at 09:55, prior to the start of the procedure. A Minimum amount of bleeding was controlled with Pressure. The procedure was tolerated well with a pain level of 0 throughout and a pain level of 0 following the procedure. Post Debridement Measurements: 1.7cm length x 1.7cm width x 0.2cm depth; 0.454cm^3 volume. Character of Wound/Ulcer Post Debridement is improved. Severity of Tissue Post Debridement is: Fat layer exposed. Post procedure Diagnosis Wound #7: Same as Pre-Procedure Plan Follow-up Appointments: Return Appointment in 1 week. Return Appointment in: - nurse visit 8/30 Bathing/ Shower/ Hygiene: May shower with  wound dressing protected with water repellent cover or cast protector. No tub bath. Anesthetic (Use 'Patient Medications' Section for Anesthetic Order Entry): [name], OVERBY [name] ([id number]) [id number] Page 9 of 10 Lidocaine applied to wound bed Edema Control - Lymphedema / Segmental Compressive Device / Other: Optional: One layer of unna paste to top of compression wrap (to act as an anchor). UrgoK2 WOUND #7: - Lower Leg Wound Laterality: Right, Lateral Cleanser: Soap and Water 1 x Per Week/30 Days Discharge Instructions: Gently cleanse wound with antibacterial soap, rinse and pat dry prior to dressing wounds Cleanser: Vashe 5.8 (oz) 1 x Per Week/30 Days Discharge Instructions: Use vashe 5.8 (oz) as directed Topical: Gentamicin 1 x Per Week/30 Days Discharge Instructions: Apply as directed by provider. Topical: Mupirocin Ointment 1 x Per Week/30 Days Discharge Instructions: Apply as directed by provider. Prim Dressing: Hydrofera Blue Ready Transfer Foam, 2.5x2.5 (in/in) 1 x Per Week/30 Days ary Discharge Instructions: Apply Hydrofera Blue Ready to wound bed as directed Secondary Dressing: ABD Pad 5x9 (in/in) 1 x Per Week/30 Days Discharge Instructions: Cover with ABD pad Com pression Wrap: Urgo K2, two layer compression system, large 1 x Per Week/30 Days 1. In office sharp debridement 2. Antibiotic ointment with Hydrofera Blue under Urgo K2 3. Follow-up for nurse visit later in the week and with physician in 1 week Electronic Signature(s) Signed: 07/18/2023 11:48:03 AM By: Geralyn Corwin DO Entered By: Geralyn Corwin on 07/18/2023 10:08:53 -------------------------------------------------------------------------------- ROS/PFSH Details Patient Name: Date of Service: [name], Lillianah [name]. 07/18/2023 9:30 A M Medical Record Number: [id number] Patient Account Number: [id number] Date of Birth/Sex: Treating RN: 01-Jul-1981 (42 y.o. Starleen Arms, Leah Primary  Care Provider: PA Zenovia Jordan, NO Other Clinician: Referring Provider: Treating Provider/Extender: Geralyn Corwin Self, Referral Weeks in Treatment: 3 Information Obtained From Patient Hematologic/Lymphatic Medical History: Positive for: Lymphedema Respiratory Medical History: Negative for: Aspiration; Asthma; Chronic Obstructive Pulmonary Disease (COPD); Pneumothorax; Sleep Apnea; Tuberculosis Cardiovascular Medical History: Positive for: Hypertension Negative for: Angina; Arrhythmia; Congestive Heart Failure; Coronary Artery Disease; Deep Vein Thrombosis; Hypotension; Myocardial Infarction; Peripheral Arterial Disease; Peripheral Venous Disease; Phlebitis; Vasculitis Endocrine Medical History: Negative for: Type I Diabetes; Type II Diabetes Immunizations [name], [name] ([id number]) [id number] Page 10 of 10 Pneumococcal Vaccine: Received Pneumococcal Vaccination: No Immunization Notes: up to date Implantable Devices None Family and Social History Cancer:  No; Diabetes: No; Heart Disease: No; Hereditary Spherocytosis: No; Hypertension: No; Kidney Disease: Yes - Mother; Lung Disease: No; Seizures: No; Stroke: No; Thyroid Problems: No; Tuberculosis: No; Never smoker; Marital Status - Widowed; Alcohol Use: Never; Drug Use: No History; Caffeine Use: Daily; Financial Concerns: No; Food, Clothing or Shelter Needs: No; Support System Lacking: No; Transportation Concerns: No Electronic Signature(s) Signed: 07/18/2023 11:48:03 AM By: Geralyn Corwin DO Signed: 07/18/2023 5:16:29 PM By: Bonnell Public Entered By: Geralyn Corwin on 07/18/2023 10:10:42 -------------------------------------------------------------------------------- SuperBill Details Patient Name: Date of Service: [name], Inas [name]. 07/18/2023 Medical Record Number: [id number] Patient Account Number: [id number] Date of Birth/Sex: Treating RN: Oct 24, 1981 (42 y.o. Starleen Arms, Leah Primary Care Provider:  PA TIENT, NO Other Clinician: Referring Provider: Treating Provider/Extender: Geralyn Corwin Self, Referral Weeks in Treatment: 3 Diagnosis Coding ICD-10 Codes Code Description [phone] Non-pressure chronic ulcer of other part of right lower leg with fat layer exposed I87.311 Chronic venous hypertension (idiopathic) with ulcer of right lower extremity I89.0 Lymphedema, not elsewhere classified Facility Procedures : CPT4 Code: [id number] Description: 11042 - DEB SUBQ TISSUE 20 SQ CM/< ICD-10 Diagnosis Description L97.812 Non-pressure chronic ulcer of other part of right lower leg with fat layer exp I87.311 Chronic venous hypertension (idiopathic) with ulcer of right lower extremity I89.0  Lymphedema, not elsewhere classified Modifier: osed Quantity: 1 Physician Procedures : CPT4 Code Description Modifier [id number] 11042 - WC PHYS SUBQ TISS 20 SQ CM ICD-10 Diagnosis Description L97.812 Non-pressure chronic ulcer of other part of right lower leg with fat layer exposed I87.311 Chronic venous hypertension (idiopathic) with  ulcer of right lower extremity I89.0 Lymphedema, not elsewhere classified Quantity: 1 Electronic Signature(s) Signed: 07/18/2023 11:48:03 AM By: Geralyn Corwin DO Entered By: Geralyn Corwin on 07/18/2023 10:09:05

## 2023-07-18 NOTE — Progress Notes (Signed)
DEANDRIA, MOUSE R ([id number]) [id number] Page 1 of 7 Visit Report for 07/18/2023 Arrival Information Details Patient Name: Date of Service: MIRABEL, BOEHLER. 07/18/2023 9:30 A M Medical Record Number: [id number] Patient Account Number: [id number] Date of Birth/Sex: Treating RN: [birth year] (42 y.o. Starleen Arms, Leah Primary Care Kaiah Hosea: PA Zenovia Jordan, NO Other Clinician: Referring Marwan Lipe: Treating Jara Feider/Extender: Geralyn Corwin Self, Referral Weeks in Treatment: 3 Visit Information History Since Last Visit All ordered tests and consults were completed: No Patient Arrived: Ambulatory Added or deleted any medications: No Arrival Time: 09:39 Any new allergies or adverse reactions: No Accompanied By: self Had a fall or experienced change in No Transfer Assistance: None activities of daily living that may affect Patient Identification Verified: Yes risk of falls: Secondary Verification Process Completed: Yes Signs or symptoms of abuse/neglect since last visito No Patient Requires Transmission-Based Precautions: No Has Dressing in Place as Prescribed: No Patient Has Alerts: No Has Compression in Place as Prescribed: No Pain Present Now: No Electronic Signature(s) Signed: 07/18/2023 5:16:29 PM By: Bonnell Public Entered By: Bonnell Public on 07/18/2023 09:46:29 -------------------------------------------------------------------------------- Encounter Discharge Information Details Patient Name: Date of Service: [facility], Merdith R. 07/18/2023 9:30 A M Medical Record Number: [id number] Patient Account Number: [id number] Date of Birth/Sex: Treating RN: [birth year] (42 y.o. Starleen Arms, Leah Primary Care Peniel Hass: PA Zenovia Jordan, NO Other Clinician: Referring Yamilex Borgwardt: Treating Abeera Flannery/Extender: Geralyn Corwin Self, Referral Weeks in Treatment: 3 Encounter Discharge Information Items Post Procedure Vitals Discharge Condition: Stable Temperature (F): 98 Ambulatory Status:  Ambulatory Pulse (bpm): 84 Discharge Destination: Home Respiratory Rate (breaths/min): 18 Transportation: Private Auto Blood Pressure (mmHg): 114/77 Schedule Follow-up Appointment: No Clinical Summary of Care: Electronic Signature(sASHARA, PORTZ R ([id number]) [id number] Page 2 of 7 Signed: 07/18/2023 5:16:29 PM By: Bonnell Public Entered By: Bonnell Public on 07/18/2023 10:12:25 -------------------------------------------------------------------------------- Lower Extremity Assessment Details Patient Name: Date of Service: [facility], Aislynn R. 07/18/2023 9:30 A M Medical Record Number: [id number] Patient Account Number: [id number] Date of Birth/Sex: Treating RN: 1981/09/11 (42 y.o. Starleen Arms, Leah Primary Care Sitlali Koerner: PA TIENT, NO Other Clinician: Referring Sorrel Cassetta: Treating Trystyn Dolley/Extender: Geralyn Corwin Self, Referral Weeks in Treatment: 3 Edema Assessment Assessed: [Left: No] [Right: No] [Left: Edema] [Right: :] Calf Left: Right: Point of Measurement: 38 cm From Medial Instep 64 cm Ankle Left: Right: Point of Measurement: 15 cm From Medial Instep 42 cm Vascular Assessment Pulses: Dorsalis Pedis Palpable: [Right:No] Doppler Audible: [Right:Yes] Posterior Tibial Palpable: [Right:No] Doppler Audible: [Right:Yes] Extremity colors, hair growth, and conditions: Extremity Color: [Right:Hyperpigmented] Hair Growth on Extremity: [Right:No] Temperature of Extremity: [Right:Warm] Capillary Refill: [Right:< 3 seconds] Dependent Rubor: [Right:No] Blanched when Elevated: [Right:No No] Toe Nail Assessment Left: Right: Thick: Yes Discolored: Yes Deformed: Yes Improper Length and Hygiene: Yes Electronic Signature(s) Signed: 07/18/2023 5:16:29 PM By: Bonnell Public Entered By: Bonnell Public on 07/18/2023 09:51:22 Haub, Graci R ([id number]) [id number] Page 3 of  7 -------------------------------------------------------------------------------- Multi-Disciplinary Care Plan Details Patient Name: Date of Service: SHANTAVIA, HARTL. 07/18/2023 9:30 A M Medical Record Number: [id number] Patient Account Number: [id number] Date of Birth/Sex: Treating RN: 30-Mar-1981 (42 y.o. Starleen Arms, Leah Primary Care Miriah Maruyama: PA Zenovia Jordan, NO Other Clinician: Referring Mayla Biddy: Treating Elzia Hott/Extender: Geralyn Corwin Self, Referral Weeks in Treatment: 3 Active Inactive Venous Leg Ulcer Nursing Diagnoses: Actual venous Insuffiency (use after diagnosis is confirmed) Knowledge deficit related to disease process and management Goals: Patient will maintain optimal edema control Date Initiated: 06/27/2023 Target Resolution Date: 07/28/2023 Goal Status: Active Patient/caregiver will verbalize understanding of disease process and disease  management Date Initiated: 06/27/2023 Target Resolution Date: 07/28/2023 Goal Status: Active Verify adequate tissue perfusion prior to therapeutic compression application Date Initiated: 06/27/2023 Target Resolution Date: 07/28/2023 Goal Status: Active Interventions: Assess peripheral edema status every visit. Compression as ordered Provide education on venous insufficiency Treatment Activities: Non-invasive vascular studies : 06/27/2023 T ordered outside of clinic : 06/27/2023 est Therapeutic compression applied : 06/27/2023 Notes: Wound/Skin Impairment Nursing Diagnoses: Impaired tissue integrity Knowledge deficit related to ulceration/compromised skin integrity Goals: Patient/caregiver will verbalize understanding of skin care regimen Date Initiated: 06/27/2023 Target Resolution Date: 07/28/2023 Goal Status: Active Ulcer/skin breakdown will have a volume reduction of 30% by week 4 Date Initiated: 06/27/2023 Target Resolution Date: 07/28/2023 Goal Status: Active Ulcer/skin breakdown will have a volume reduction of 50% by week 8 Date  Initiated: 06/27/2023 Target Resolution Date: 08/27/2023 Goal Status: Active Ulcer/skin breakdown will have a volume reduction of 80% by week 12 Date Initiated: 06/27/2023 Target Resolution Date: 09/27/2023 Goal Status: Active Interventions: Assess patient/caregiver ability to obtain necessary supplies Lobue, Carle R ([id number]) [id number] Page 4 of 7 Assess patient/caregiver ability to perform ulcer/skin care regimen upon admission and as needed Assess ulceration(s) every visit Provide education on ulcer and skin care Treatment Activities: Skin care regimen initiated : 06/27/2023 Notes: Electronic Signature(s) Signed: 07/18/2023 5:16:29 PM By: Bonnell Public Entered By: Bonnell Public on 07/18/2023 10:02:54 -------------------------------------------------------------------------------- Pain Assessment Details Patient Name: Date of Service: [facility], Laylamarie R. 07/18/2023 9:30 A M Medical Record Number: [id number] Patient Account Number: [id number] Date of Birth/Sex: Treating RN: [birth year] (42 y.o. Starleen Arms, Leah Primary Care Verlyn Dannenberg: PA Zenovia Jordan, [place] Other Clinician: Referring Reiley Keisler: Treating Georgiann Neider/Extender: Geralyn Corwin Self, Referral Weeks in Treatment: 3 Active Problems Location of Pain Severity and Description of Pain Patient Has Paino No Site Locations Pain Management and Medication Current Pain Management: Electronic Signature(s) Signed: 07/18/2023 5:16:29 PM By: Bonnell Public Entered By: Bonnell Public on 07/18/2023 09:41:39 Ryner, Vivianna R ([id number]) [id number] Page 5 of 7 -------------------------------------------------------------------------------- Patient/Caregiver Education Details Patient Name: Date of Service: MYESHA, MOFFATT 8/28/2024andnbsp9:30 A M Medical Record Number: [id number] Patient Account Number: [id number] Date of Birth/Gender: Treating RN: 02-24-1981 (42 y.o. Starleen Arms, Leah Primary Care  Physician: PA Zenovia Jordan, [place] Other Clinician: Referring Physician: Treating Physician/Extender: Geralyn Corwin Self, Referral Weeks in Treatment: 3 Education Assessment Education Provided To: Patient Education Topics Provided Wound/Skin Impairment: Handouts: Caring for Your Ulcer Methods: Explain/Verbal Responses: State content correctly Electronic Signature(s) Signed: 07/18/2023 5:16:29 PM By: Bonnell Public Entered By: Bonnell Public on 07/18/2023 10:11:13 -------------------------------------------------------------------------------- Wound Assessment Details Patient Name: Date of Service: [facility], Princess R. 07/18/2023 9:30 A M Medical Record Number: [id number] Patient Account Number: [id number] Date of Birth/Sex: Treating RN: 09/14/1981 (42 y.o. Starleen Arms, Leah Primary Care Tabatha Razzano: PA Zenovia Jordan, [place] Other Clinician: Referring Elaysha Bevard: Treating Anquinette Pierro/Extender: Geralyn Corwin Self, Referral Weeks in Treatment: 3 Wound Status Wound Number: 7 Primary Etiology: Venous Leg Ulcer Wound Location: Right, Lateral Lower Leg Wound Status: Open Wounding Event: Gradually Appeared Comorbid History: Lymphedema, Hypertension Date Acquired: 06/06/2023 Weeks Of Treatment: 3 Clustered Wound: No Photos KENA, PERFECTO R ([id number]) [id number] Page 6 of 7 Wound Measurements Length: (cm) 1.7 Width: (cm) 1.7 Depth: (cm) 0.2 Area: (cm) 2.27 Volume: (cm) 0.454 % Reduction in Area: 34.3% % Reduction in Volume: 56.2% Epithelialization: None Wound Description Classification: Full Thickness Without Exposed Support Structures Exudate Amount: None Present Foul Odor After Cleansing: No Slough/Fibrino Yes Wound Bed Granulation Amount: Small (1-33%) Exposed Structure Granulation Quality: Red, Pink Fascia Exposed: No Necrotic Amount: Medium (  34-66%) Fat Layer (Subcutaneous Tissue) Exposed: Yes Necrotic Quality: Adherent Slough Tendon Exposed: No Muscle Exposed: No Joint  Exposed: No Bone Exposed: No Treatment Notes Wound #7 (Lower Leg) Wound Laterality: Right, Lateral Cleanser Soap and Water Discharge Instruction: Gently cleanse wound with antibacterial soap, rinse and pat dry prior to dressing wounds Vashe 5.8 (oz) Discharge Instruction: Use vashe 5.8 (oz) as directed Peri-Wound Care Topical Gentamicin Discharge Instruction: Apply as directed by Bena Kobel. Mupirocin Ointment Discharge Instruction: Apply as directed by Chayanne Speir. Primary Dressing Hydrofera Blue Ready Transfer Foam, 2.5x2.5 (in/in) Discharge Instruction: Apply Hydrofera Blue Ready to wound bed as directed Secondary Dressing ABD Pad 5x9 (in/in) Discharge Instruction: Cover with ABD pad Secured With Compression Wrap Urgo K2, two layer compression system, large Compression Stockings Add-Ons Electronic Signature(s) Signed: 07/18/2023 5:16:29 PM By: Bonnell Public Entered By: Bonnell Public on 07/18/2023 09:49:47 Schou, Devann R ([id number]) [id number] Page 7 of 7 -------------------------------------------------------------------------------- Vitals Details Patient Name: Date of Service: PENELOPI, VANHORNE. 07/18/2023 9:30 A M Medical Record Number: [id number] Patient Account Number: [id number] Date of Birth/Sex: Treating RN: May 22, 1981 (42 y.o. F) Coulter, Leah Primary Care Binyomin Brann: PA TIENT, NO Other Clinician: Referring Tennyson Kallen: Treating Baylie Drakes/Extender: Geralyn Corwin Self, Referral Weeks in Treatment: 3 Vital Signs Time Taken: 09:41 Temperature (F): 98.0 Height (in): 73 Pulse (bpm): 84 Weight (lbs): 436 Respiratory Rate (breaths/min): 18 Body Mass Index (BMI): 57.5 Blood Pressure (mmHg): 114/77 Reference Range: 80 - 120 mg / dl Electronic Signature(s) Signed: 07/18/2023 5:16:29 PM By: Bonnell Public Entered By: Bonnell Public on 07/18/2023 09:41:33

## 2023-07-20 DIAGNOSIS — I87311 Chronic venous hypertension (idiopathic) with ulcer of right lower extremity: Secondary | ICD-10-CM | POA: Diagnosis not present

## 2023-07-20 NOTE — Progress Notes (Signed)
TICIA, STEPTOE R ([id number]) [id number] Page 1 of 4 Visit Report for 07/20/2023 Arrival Information Details Patient Name: Date of ServiceJHOVANA, NELL. 07/20/2023 1:00 PM Medical Record Number: [id number] Patient Account Number: [id number] Date of Birth/Sex: Treating RN: [birth year] (42 y.o. Esmeralda Links Primary Care Tyren Dugar: PA TIENT, NO Other Clinician: Referring Etosha Wetherell: Treating Tiombe Tomeo/Extender: [name], [name], Referral Weeks in Treatment: 3 Visit Information History Since Last Visit Added or deleted any medications: No Patient Arrived: Ambulatory Any new allergies or adverse reactions: No Arrival Time: 13:26 Had a fall or experienced change in No Accompanied By: self activities of daily living that may affect Transfer Assistance: None risk of falls: Patient Identification Verified: Yes Hospitalized since last visit: No Secondary Verification Process Completed: Yes Has Dressing in Place as Prescribed: Yes Patient Requires Transmission-Based Precautions: No Has Compression in Place as Prescribed: Yes Patient Has Alerts: No Pain Present Now: No Electronic Signature(s) Signed: 07/20/2023 1:26:41 PM By: Angelina Pih Entered By: Angelina Pih on 07/20/2023 10:26:41 -------------------------------------------------------------------------------- Clinic Level of Care Assessment Details Patient Name: Date of ServiceRACHELE, ATMORE. 07/20/2023 1:00 PM Medical Record Number: [id number] Patient Account Number: [id number] Date of Birth/Sex: Treating RN: 1981-11-01 (42 y.o. Esmeralda Links Primary Care Audreanna Torrisi: PA Zenovia Jordan, NO Other Clinician: Referring Rozella Servello: Treating Chynah Orihuela/Extender: [name], [name], Referral Weeks in Treatment: 3 Clinic Level of Care Assessment Items TOOL 1 Quantity Score []  - 0 Use when EandM and Procedure is performed on INITIAL visit ASSESSMENTS - Nursing Assessment / Reassessment []  -  0 General Physical Exam (combine w/ comprehensive assessment (listed just below) when performed on new pt. evals) []  - 0 Comprehensive Assessment (HX, ROS, Risk Assessments, Wounds Hx, etc.) ASSESSMENTS - Wound and Skin Assessment / Reassessment []  - 0 Dermatologic / Skin Assessment (not related to wound area) Oleski, Mialee R ([id number]) [id number] Page 2 of 4 ASSESSMENTS - Ostomy and/or Continence Assessment and Care []  - 0 Incontinence Assessment and Management []  - 0 Ostomy Care Assessment and Management (repouching, etc.) PROCESS - Coordination of Care []  - 0 Simple Patient / Family Education for ongoing care []  - 0 Complex (extensive) Patient / Family Education for ongoing care []  - 0 Staff obtains Chiropractor, Records, T Results / Process Orders est []  - 0 Staff telephones HHA, Nursing Homes / Clarify orders / etc []  - 0 Routine Transfer to another Facility (non-emergent condition) []  - 0 Routine Hospital Admission (non-emergent condition) []  - 0 New Admissions / Manufacturing engineer / Ordering NPWT Apligraf, etc. , []  - 0 Emergency Hospital Admission (emergent condition) PROCESS - Special Needs []  - 0 Pediatric / Minor Patient Management []  - 0 Isolation Patient Management []  - 0 Hearing / Language / Visual special needs []  - 0 Assessment of Community assistance (transportation, D/C planning, etc.) []  - 0 Additional assistance / Altered mentation []  - 0 Support Surface(s) Assessment (bed, cushion, seat, etc.) INTERVENTIONS - Miscellaneous []  - 0 External ear exam []  - 0 Patient Transfer (multiple staff / Nurse, adult / Similar devices) []  - 0 Simple Staple / Suture removal (25 or less) []  - 0 Complex Staple / Suture removal (26 or more) []  - 0 Hypo/Hyperglycemic Management (do not check if billed separately) []  - 0 Ankle / Brachial Index (ABI) - do not check if billed separately Has the patient been seen at the hospital within  the last three years: Yes Total Score: 0 Level Of Care: ____ Electronic Signature(s) Signed:  07/20/2023 2:06:12 PM By: Angelina Pih Entered By: Angelina Pih on 07/20/2023 10:27:52 -------------------------------------------------------------------------------- Compression Therapy Details Patient Name: Date of Service: [facility], Johnette R. 07/20/2023 1:00 PM Medical Record Number: [id number] Patient Account Number: [id number] Date of Birth/Sex: Treating RN: [birth year] (42 y.o. Esmeralda Links Primary Care Tynlee Bayle: PA TIENT, NO Other Clinician: Referring Jamicah Anstead: Treating Lillyona Polasek/Extender: [name], [name], Referral Weeks in Treatment: 3 Compression Therapy Performed for Wound Assessment: Wound #7 Right,Lateral Lower Leg Performed By: Holly Bodily, RN Compression TypePerrin Maltese Pleas Patricia ([id number]) [id number] Page 3 of 4 Electronic Signature(s) Signed: 07/20/2023 1:27:13 PM By: Angelina Pih Entered By: Angelina Pih on 07/20/2023 10:27:13 -------------------------------------------------------------------------------- Encounter Discharge Information Details Patient Name: Date of Service: [facility], Syeda R. 07/20/2023 1:00 PM Medical Record Number: [id number] Patient Account Number: [id number] Date of Birth/Sex: Treating RN: [birth year] (42 y.o. Esmeralda Links Primary Care Hadi Dubin: PA TIENT, NO Other Clinician: Referring Gwenn Teodoro: Treating Hatsue Sime/Extender: [name], [name], Referral Weeks in Treatment: 3 Encounter Discharge Information Items Discharge Condition: Stable Ambulatory Status: Ambulatory Discharge Destination: Home Transportation: Private Auto Accompanied By: self Schedule Follow-up Appointment: Yes Clinical Summary of Care: Electronic Signature(s) Signed: 07/20/2023 1:27:47 PM By: Angelina Pih Entered By: Angelina Pih on 07/20/2023  10:27:47 -------------------------------------------------------------------------------- Wound Assessment Details Patient Name: Date of Service: [facility], Edina R. 07/20/2023 1:00 PM Medical Record Number: [id number] Patient Account Number: [id number] Date of Birth/Sex: Treating RN: 07-Jun-1981 (42 y.o. Esmeralda Links Primary Care Iman Orourke: PA TIENT, NO Other Clinician: Referring Ivanka Kirshner: Treating Almon Whitford/Extender: [name], [name], Referral Weeks in Treatment: 3 Wound Status Wound Number: 7 Primary Etiology: Venous Leg Ulcer Wound Location: Right, Lateral Lower Leg Wound Status: Open Wounding Event: Gradually Appeared Comorbid History: Lymphedema, Hypertension Date Acquired: 06/06/2023 Weeks Of Treatment: 3 Clustered Wound: No Wound Measurements Traynham, Gracilyn R ([id number]) Length: (cm) 1.7 Width: (cm) 1.7 Depth: (cm) 0.2 Area: (cm) 2.27 Volume: (cm) 0.454 [id number] Page 4 of 4 % Reduction in Area: 34.3% % Reduction in Volume: 56.2% Epithelialization: None Wound Description Classification: Full Thickness Without Exposed Support Structures Exudate Amount: None Present Foul Odor After Cleansing: No Slough/Fibrino Yes Wound Bed Granulation Amount: Small (1-33%) Exposed Structure Granulation Quality: Red, Pink Fascia Exposed: No Necrotic Amount: Medium (34-66%) Fat Layer (Subcutaneous Tissue) Exposed: Yes Necrotic Quality: Adherent Slough Tendon Exposed: No Muscle Exposed: No Joint Exposed: No Bone Exposed: No Treatment Notes Wound #7 (Lower Leg) Wound Laterality: Right, Lateral Cleanser Soap and Water Discharge Instruction: Gently cleanse wound with antibacterial soap, rinse and pat dry prior to dressing wounds Vashe 5.8 (oz) Discharge Instruction: Use vashe 5.8 (oz) as directed Peri-Wound Care Topical Gentamicin Discharge Instruction: Apply as directed by Hyrum Shaneyfelt. Mupirocin Ointment Discharge Instruction: Apply as  directed by Sharada Albornoz. Primary Dressing Hydrofera Blue Ready Transfer Foam, 2.5x2.5 (in/in) Discharge Instruction: Apply Hydrofera Blue Ready to wound bed as directed Secondary Dressing ABD Pad 5x9 (in/in) Discharge Instruction: Cover with ABD pad Secured With Compression Wrap Urgo K2, two layer compression system, large Compression Stockings Add-Ons Electronic Signature(s) Signed: 07/20/2023 1:26:59 PM By: Angelina Pih Entered By: Angelina Pih on 07/20/2023 10:26:59

## 2023-07-20 NOTE — Progress Notes (Signed)
AGAPITA, ATWAL R ([id number]) [id number] Page 1 of 2 Visit Report for 07/20/2023 Physician Orders Details Patient Name: Date of ServiceCHLOE, ADGATE. 07/20/2023 1:00 PM Medical Record Number: [id number] Patient Account Number: [id number] Date of Birth/Sex: Treating RN: 1981-10-05 (42 y.o. Esmeralda Links Primary Care Provider: PA Zenovia Jordan, NO Other Clinician: Referring Provider: Treating Provider/Extender: [name], [name], Referral Weeks in Treatment: 3 Verbal / Phone Orders: No Diagnosis Coding Follow-up Appointments Return Appointment in 1 week. ppointment in: - nurse visit (one time only) 8/30 Return A Bathing/ Shower/ Hygiene May shower with wound dressing protected with water repellent cover or cast protector. No tub bath. Anesthetic (Use 'Patient Medications' Section for Anesthetic Order Entry) Lidocaine applied to wound bed Edema Control - Lymphedema / Segmental Compressive Device / Other Optional: One layer of unna paste to top of compression wrap (to act as an anchor). UrgoK2 Wound Treatment Wound #7 - Lower Leg Wound Laterality: Right, Lateral Cleanser: Soap and Water 2 x Per Week/30 Days Discharge Instructions: Gently cleanse wound with antibacterial soap, rinse and pat dry prior to dressing wounds Cleanser: Vashe 5.8 (oz) 2 x Per Week/30 Days Discharge Instructions: Use vashe 5.8 (oz) as directed Topical: Gentamicin 2 x Per Week/30 Days Discharge Instructions: Apply as directed by provider. Topical: Mupirocin Ointment 2 x Per Week/30 Days Discharge Instructions: Apply as directed by provider. Prim Dressing: Hydrofera Blue Ready Transfer Foam, 2.5x2.5 (in/in) 2 x Per Week/30 Days ary Discharge Instructions: Apply Hydrofera Blue Ready to wound bed as directed Secondary Dressing: ABD Pad 5x9 (in/in) 2 x Per Week/30 Days Discharge Instructions: Cover with ABD pad Compression Wrap: Urgo K2, two layer compression system, large  2 x Per Week/30 Days Electronic Signature(s) Signed: 07/20/2023 1:27:29 PM By: Angelina Pih Signed: 07/20/2023 2:10:50 PM By: Baltazar Najjar MD Entered By: Angelina Pih on 07/20/2023 10:27:29 [name] ([id number]) [id number] Page 2 of 2 -------------------------------------------------------------------------------- SuperBill Details Patient Name: Date of ServiceNAILAH, OCEJO 07/20/2023 Medical Record Number: [id number] Patient Account Number: [id number] Date of Birth/Sex: Treating RN: 1981/03/03 (42 y.o. 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

## 2023-07-24 LAB — VAS US ABI WITH/WO TBI

## 2023-07-25 ENCOUNTER — Other Ambulatory Visit: Payer: Self-pay

## 2023-07-25 ENCOUNTER — Emergency Department (HOSPITAL_COMMUNITY): Payer: 59

## 2023-07-25 ENCOUNTER — Encounter (HOSPITAL_COMMUNITY): Payer: Self-pay

## 2023-07-25 ENCOUNTER — Emergency Department (HOSPITAL_COMMUNITY)
Admission: EM | Admit: 2023-07-25 | Discharge: 2023-07-26 | Disposition: A | Discharge status: Home / Self Care | Payer: 59 | Attending: Emergency Medicine | Admitting: Emergency Medicine

## 2023-07-25 ENCOUNTER — Encounter: Payer: 59 | Attending: Internal Medicine | Admitting: Internal Medicine

## 2023-07-25 DIAGNOSIS — L97812 Non-pressure chronic ulcer of other part of right lower leg with fat layer exposed: Secondary | ICD-10-CM | POA: Diagnosis not present

## 2023-07-25 DIAGNOSIS — S20219A Contusion of unspecified front wall of thorax, initial encounter: Secondary | ICD-10-CM | POA: Diagnosis not present

## 2023-07-25 DIAGNOSIS — I872 Venous insufficiency (chronic) (peripheral): Secondary | ICD-10-CM | POA: Insufficient documentation

## 2023-07-25 DIAGNOSIS — Y92410 Unspecified street and highway as the place of occurrence of the external cause: Secondary | ICD-10-CM | POA: Diagnosis not present

## 2023-07-25 DIAGNOSIS — Z6841 Body Mass Index (BMI) 40.0 and over, adult: Secondary | ICD-10-CM | POA: Insufficient documentation

## 2023-07-25 DIAGNOSIS — E669 Obesity, unspecified: Secondary | ICD-10-CM | POA: Insufficient documentation

## 2023-07-25 DIAGNOSIS — I87311 Chronic venous hypertension (idiopathic) with ulcer of right lower extremity: Secondary | ICD-10-CM

## 2023-07-25 DIAGNOSIS — I890 Lymphedema, not elsewhere classified: Secondary | ICD-10-CM

## 2023-07-25 DIAGNOSIS — R0789 Other chest pain: Secondary | ICD-10-CM | POA: Diagnosis present

## 2023-07-25 DIAGNOSIS — I110 Hypertensive heart disease with heart failure: Secondary | ICD-10-CM | POA: Insufficient documentation

## 2023-07-25 DIAGNOSIS — I10 Essential (primary) hypertension: Secondary | ICD-10-CM | POA: Insufficient documentation

## 2023-07-25 LAB — BASIC METABOLIC PANEL
Anion gap: 8 (ref 5–15)
BUN: 15 mg/dL (ref 6–20)
CO2: 26 mmol/L (ref 22–32)
Calcium: 9.2 mg/dL (ref 8.9–10.3)
Chloride: 105 mmol/L (ref 98–111)
Creatinine, Ser: 0.87 mg/dL (ref 0.44–1.00)
GFR, Estimated: >60 mL/min (ref >60)
Glucose, Bld: 84 mg/dL (ref 70–99)
Potassium: 3.4 mmol/L — ABNORMAL LOW (ref 3.5–5.1)
Sodium: 139 mmol/L (ref 135–145)

## 2023-07-25 LAB — CBC
HCT: 35.3 % — ABNORMAL LOW (ref 36.0–46.0)
Hemoglobin: 10.7 g/dL — ABNORMAL LOW (ref 12.0–15.0)
MCH: 27.5 pg (ref 26.0–34.0)
MCHC: 30.3 g/dL (ref 30.0–36.0)
MCV: 90.7 fL (ref 80.0–100.0)
Platelets: 293 10*3/uL (ref 150–400)
RBC: 3.89 MIL/uL (ref 3.87–5.11)
RDW: 13.2 % (ref 11.5–15.5)
WBC: 9.4 10*3/uL (ref 4.0–10.5)
nRBC: 0 % (ref 0.0–0.2)

## 2023-07-25 LAB — TROPONIN I (HIGH SENSITIVITY): Troponin I (High Sensitivity): 3 ng/L (ref <18)
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
## 2023-07-26 ENCOUNTER — Emergency Department (HOSPITAL_COMMUNITY): Payer: 59

## 2023-07-26 ENCOUNTER — Encounter (HOSPITAL_COMMUNITY): Payer: Self-pay

## 2023-07-26 LAB — PREGNANCY, URINE: Preg Test, Ur: NEGATIVE

## 2023-07-26 MED ORDER — METHOCARBAMOL 500 MG PO TABS: 500.0000 mg | ORAL_TABLET | Freq: Two times a day (BID) | ORAL | 0 refills | Status: DC

## 2023-07-26 MED ORDER — IOHEXOL 300 MG/ML  SOLN
100.0000 mL | Freq: Once | INTRAMUSCULAR | Status: AC | PRN
  Administered 2023-07-26: 100 mL via INTRAVENOUS

## 2023-07-26 MED ORDER — SODIUM CHLORIDE (PF) 0.9 % IJ SOLN
INTRAMUSCULAR | Status: AC
  Filled 2023-07-26: qty 50

## 2023-07-26 MED ORDER — NAPROXEN 500 MG PO TABS
500.0000 mg | ORAL_TABLET | Freq: Two times a day (BID) | ORAL | 0 refills | Status: DC
Start: 2023-07-26 — End: 2024-08-01

## 2023-07-26 NOTE — Discharge Instructions (Signed)
See your doctor if pain does not improve over the next week. Take medications as prescribed.   Return to the emergency department with any new or worsening symptoms.

## 2023-07-26 NOTE — ED Notes (Addendum)
UA sample sent to lab, if needed.

## 2023-07-26 NOTE — ED Notes (Signed)
Patient transported to CT 

## 2023-07-26 NOTE — ED Notes (Signed)
Lab called to add on urine pregnancy at this time.
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
## 2023-07-27 NOTE — Progress Notes (Signed)
[name], [name] ([id number]) [id number] Page 1 of 11 Visit Report for 07/25/2023 Chief Complaint Document Details Patient Name: Date of Service: [name], STINGLE. 07/25/2023 9:30 A M Medical Record Number: [id number] Patient Account Number: [id number] Date of Birth/Sex: Treating RN: [birth year] (42 y.o. [name] Primary Care Provider: PA Zenovia Jordan, [place] Other Clinician: Referring Provider: Treating Provider/Extender: Geralyn Corwin Self, Referral Weeks in Treatment: 4 Information Obtained from: Patient Chief Complaint 06/27/2023; right lower extremity wound Electronic Signature(s) Signed: 07/25/2023 12:24:25 PM By: Geralyn Corwin DO Entered By: Geralyn Corwin on 07/25/2023 07:41:54 -------------------------------------------------------------------------------- Debridement Details Patient Name: Date of Service: [name], [name] 07/25/2023 9:30 A M Medical Record Number: [id number] Patient Account Number: [id number] Date of Birth/Sex: Treating RN: 1981/08/30 (42 y.o. [name] Primary Care Provider: PA Zenovia Jordan, NO Other Clinician: Referring Provider: Treating Provider/Extender: Geralyn Corwin Self, Referral Weeks in Treatment: 4 Debridement Performed for Assessment: Wound #7 Right,Lateral Lower Leg Performed By: Physician Geralyn Corwin, MD Debridement Type: Debridement Severity of Tissue Pre Debridement: Fat layer exposed Level of Consciousness (Pre-procedure): Awake and Alert Pre-procedure Verification/Time Out Yes - 10:35 Taken: Start Time: 10:35 Pain Control: Lidocaine 4% T opical Solution Percent of Wound Bed Debrided: 100% T Area Debrided (cm): otal 2 Tissue and other material debrided: Viable, Non-Viable, Slough, Slough Level: Non-Viable Tissue Debridement Description: Selective/Open Wound Instrument: Curette Bleeding: Minimum Hemostasis Achieved: Pressure Procedural Pain: 0 Post Procedural Pain: 0 Cryder, [name] ([id number])  [id number] Page 2 of 11 Response to Treatment: Procedure was tolerated well Level of Consciousness (Post- Awake and Alert procedure): Post Debridement Measurements of Total Wound Length: (cm) 1.5 Width: (cm) 1.7 Depth: (cm) 0.1 Volume: (cm) 0.2 Character of Wound/Ulcer Post Debridement: Stable Severity of Tissue Post Debridement: Fat layer exposed Post Procedure Diagnosis Same as Pre-procedure Electronic Signature(s) Signed: 07/25/2023 12:24:25 PM By: Geralyn Corwin DO Signed: 07/26/2023 5:01:27 PM By: Midge Aver MSN RN CNS WTA Entered By: Midge Aver on 07/25/2023 07:38:56 -------------------------------------------------------------------------------- HPI Details Patient Name: Date of Service: [name], [name] 07/25/2023 9:30 A M Medical Record Number: [id number] Patient Account Number: [id number] Date of Birth/Sex: Treating RN: 11-06-1981 (42 y.o. [name] Primary Care Provider: PA Zenovia Jordan, NO Other Clinician: Referring Provider: Treating Provider/Extender: Geralyn Corwin Self, Referral Weeks in Treatment: 4 History of Present Illness HPI Description: 42 year old patient well known to our [facility] wound care clinic where she has been seen since 2016 for bilateral lower extremity venous insufficiency disease with lymphedema and multiple ulcerations associated with morbid obesity. she had custom-made compression stockings and lymphedema pumps which were used in the past. most recently she was admitted to the hospital between October 11 and 09/02/2017 with sepsis, lower extremity wounds and lymphedema.she was initially treated in the outpatient with Keflex and Bactrim. she was initially treated in the hospital with vancomycin and Zosyn and changed over to Unasyn until her white count improved and her blood cultures were negative for 3 days. After her inpatient management she was discharged home on Augmentin to end on 09/13/2017 with a 14 day  course. she has had outpatient vascular duplex scans completed in November 2017 and her right ABI was 1.1 and the left ABI is 1.3. she had normal toe brachial indices bilaterally.she had three-vessel runoff in the right lower extremity and two-vessel runoff in the left lower extremity. On questioning the patient she does have custom made compression stockings and also has a lymphedema pump but has not been using it appropriately and has not been taking good care  of herself. 09/17/2017 -- she returns today with compression stockings on the left side and the right side has had significant amount of drainage and has a very strong odor 09/24/2017 -- the drainage is increased significantly and she has more lymphedema and a very strong odor to her wound. Though she does not have systemic symptoms, or overt infection I believe she will benefit from some doxycycline given empirically. 10/01/2017 -- after starting the doxycycline and changing the dressing twice a week her symptoms and signs have definitely improved overall. 10/08/2017 -- she has completed her course of doxycycline but continues to have a lot of drainage and needs twice a week dressing changes. 11/08/17-she is here in follow-up evaluation for right lower extremity ulcers. She admits to using her lymphedema pumps twice daily, one hour per session. she is voicing no complaints or concerns, no signs of infection will change to [facility] 12/14/17 on evaluation today patient appears to be doing very well in regard to her wounds. She has been tolerating the dressing changes she continues to develop some portly the adherent granular tissue on the surface of the wound with some Slough. Obviously we are trying to get too much better wound bed. With that being said the hyper granulation the Hydrofera Blue Dressing to have helped with which is excellent news. However I think it may be time to try something a little bit different at this point. 01/11/18 on  evaluation today patient appears to be doing fairly well in regard to her right lateral lower extremity ulcers. This shows excellent signs of filling in which is great news. There does not appear to be any evidence of infection which is also good news. She does continue to work as well is good school. She is having no pain. GRACELAND, WOODHOUSE [name] ([id number]) [id number] Page 3 of 11 01/22/18 on evaluation today patient appears to be doing a little bit worse in my opinion in regard to the overall quality of that granulation on her right lower extremity. She was not here last week due to being sick with a stomach virus this may have something to do with the fact that her wound appears to be a little bit worse. With that being said I'm also thinking that after switching from the [facility] Dressing to the silver collagen would really has not looked that's good in my opinion. We may want to swit 02/11/18 on evaluation today patient's right lower/lateral lower extremity ulcers appear to be doing very well at this point. Especially the more proximal ulcer has filled in much closer to surface which is good news. Nonetheless both show signs of improvement which is great news. There does not appear to be any evidence of infection which is also good news. In general patient has been doing well tolerating the wraps as well as the [employer]. 02/18/18 on evaluation today patient appears to be doing a little bit worse in regard to the periwound region the wounds themselves do not look much deteriorated to me. With that being said she has several small blisters/pustules noted in the periwound and there was a significant amount of drainage and maceration compared to previous. There has been a time that we had to bring her back for twice a week dressing changes as far as her wrap was concerned it has been a while since we've done that however. With that being said the patient has been  having some burning and in general I'm concerned about the possibility of infection.  She has previously taken doxycycline with good result. Fortunately there does not appear to be any evidence of overall worsening in regard to the size of the wound and in fact the upper wound actually appears to be showing signs of good epithelialization. 03/18/18 on evaluation today patient appears to be doing excellent in regard to her right lateral lower extremity ulcer. She has been tolerating the dressing changes without complication. Fortunately this seems to be making great progress. Overall I see no signs of infection and there is dramatic improvement overall even compared to last week. 03/28/18 on evaluation today patient appears to be doing very well in regard to her right lateral lower surety ulcer. She has been tolerating the dressing changes without complication at this point. She states currently that she's having no significant discomfort which is excellent as well. Overall I'm pleased with how things seem to be progressing. 04/01/18 on evaluation today patient appears to be doing excellent in regard to her right lateral lower extremity ulcers. She has been tolerating the dressing changes without complication which is good news. With that being said the wraps still continues to show signs of helping with her fluid she does have Juxta-Lite compression stockings for when we are done with the current treatment regimen once everything heals. Mainly she just has the one area still remaining the smaller of the two wings is pretty much closed at this point there's just a very slight opening noted. 04/08/18 on evaluation today patient appears to be doing better in regard to the original wound on the right lateral lower extremity that we have been managing. Unfortunately she has a new area of weeping more anterior on the right lateral lower extremity and on the left lower extremity she has two new ulcers  there appears to be some cellulitis noted at this time. I am concerned about the fact that this may in fact be an infection that has caused the worsening and swelling in the past this has been the case when we previously attempted to determine what was going on when she had down slides like this. With that being said the patient is seeming to tolerate the wraps fairly well for the most part. 04/17/18; since last time the patient was seen in this clinic she was hospitalized from 04/11/18 through 04/13/18; she presented with bilateral lower extremity pain worse on the right and a fever of up to 104. Noteworthy that when she was in the clinic last week she had new wounds on the left leg culture grew MRSA and she was prescribed Bactrim. She had 2 days of IV Vanco and Zosyn in the hospital. Her blood cultures were negative. She was discharged on Bactrim to cover the original MRSA on the right leg and Keflex to cover the possibility of strep. The hospitalist had a conversation with infectious disease. The patient arrives in clinic today for nurse check however given the recent hospitalization I was asked to look at her. The patient states she feels a lot better. No fever or chills. Still some pain in the right calf but a lot better. She arrived in the hospital with a white count of 15.6, the next day was 10.8. Comprehensive metabolic panel was normal. She is still taking Keflex and Bactrim It would appear that she had a surgical IandD at the bedside of the right calf felt to have a underlying abscess. According to our intake nurse the wound has expanded quite bit on the right lateral calf. She has no  open area on the left anterior and left posterior calf as described last time 04/23/18 on evaluation today patient actually appears to be doing much better than when I last saw her. This obviously has been a couple weeks ago and in the interim she was admitted to the hospital for IV antibiotic therapy due to  cellulitis, discharge, and fortunately the substance which hadn't sued has completely resolved. Her swelling seems to be better in regard to her lower extremities as well and the wounds that opened up as results of the infection seems to be showing signs of improvement. There is some Slough noted on the left lateral wound otherwise a lot of weeping noted of the right lateral leg. 04/29/18 on evaluation today patient appears to be doing excellent in regard to her bilateral lower extremity ulcers. She's been tolerating the dressing changes without complication there does not appear to be any evidence of infection at this time. Overall I think she is made great improvement and seems to be showing signs of coming back around where she was prior to the cellulitis and sepsis episode. 05/06/18 on evaluation today patient appears to be doing better in regard to her bilateral ocean the ulcers. She's been tolerating the dressing changes without complication. Fortunately there does not appear to be any evidence of infection at this time. Her swelling is significantly down overall seems to be showing signs of good improvement as well. Fortunately I do believe that she is progressing nicely back to where she was prior to the cellulitis/sepsis issue. 05/13/18 on evaluation today patient seems to be showing signs of great improvement she's been tolerating the dressing changes without complication and overall there does not appear to be any evidence of infection. All of which is good news. The only issue she really have today is that some of the [facility] Dressing actually was stuck to the periwound of the right lateral lower extremity however this was able to be removed during the debridement without complication. 05/21/18 on evaluation today patient appears to be doing very well in regard to her bilateral lower extremity ulcers. She has been tolerating the compression wraps without complication. There does not  appear to be any evidence of infection at this point which is good news as well. Overall I'm very pleased with the progress that has been made. She likewise is very happy. 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 05/28/18 on evaluation today patient actually appears to be doing excellent in regard to her bilateral lower [place] ulcers. She's made great progress since last week and overall I'm very pleased in this regard. She states that she's not have any discomfort either which is also good news there's definitely no evidence of infection. 06/03/18 on evaluation today patient actually appears to be doing excellent in regard to her ulcerations in fact these areas on both lower extremities were almost completely healed. Unfortunately she has a situation going on her life right now she actually found her husband deceased last 08-16-2023. She states that she has been quite a bit upset since that time unfortunately this is obviously a very big blow to her. Nonetheless she is concerned about the funeral and having to wear certain shoes for this that she states she cannot fit in with the wraps. She wonders if she could potentially come back on 08-16-23 to have her wraps changed and to switch into her  Juxta-Lite compression at that point. I think that would be an okay thing that we could do for her. Nonetheless she fortunately seems to be tolerating the wraps very well and again has made excellent progress with the [facility] Dressing 06/11/18 on evaluation today patient appears to be doing rather well in regard to her bilateral lower extremities in fact everything appears to be completely healed at this point which is excellent news. There does not appear to be any evidence of infection at this time which is great. In fact overall I do believe she is completely resolved in regard to her bilateral  lower extremity ulcers. She is extremely happy in this regard. 06/27/2023 [name] is a 42 year old female with a past medical history of venous insufficiency and lymphedema that presents to the clinic for a 3-week history of nonhealing wound to the right lower extremity. She states this started spontaneously. She does not wear compression stockings. She has been keeping the area covered. She currently denies signs of infection. 8/14; patient readmitted to clinic last week. She has venous insufficiency and chronic secondary lymphedema. She has a nonhealing wound on the right lateral lower leg. She has been using gent mupirocin and Hydrofera Blue using an Urgo K2 lite wrap. QUINNLYNN, PEEK [name] ([id number]) [id number] Page 4 of 11 Her ABI on the right leg was 0.74 on admission to clinic. Formal arterial studies were ordered but we have not yet had any contact with the patient. Also there was some question about juxta lites for this patient we have not heard anything about that either. 8/28; patient presents for follow-up. She had ABIs completed on 8/23 that showed noncompressible ABIs on the right and left with TBI of 0.88 on the left and 0.96 on the right. She had triphasic waveforms throughout the arterial system. This is reassuring that she has adequate blood flow for healing. We have been using antibiotic ointment with Hydrofera Blue under Urgo K2 lite wrap. She has tolerated this well. Wound is smaller. 9/4; patient presents for follow-up. We have been using Urgo 2 regular4-layer equivalent as well as Hydrofera Blue and antibiotic ointment. Wound is slightly smaller. She has no issues or complaints. She tolerated the wrap well. Electronic Signature(s) Signed: 07/25/2023 12:24:25 PM By: Geralyn Corwin DO Entered By: Geralyn Corwin on 07/25/2023 07:42:58 -------------------------------------------------------------------------------- Physical Exam Details Patient  Name: Date of Service: [name], Lasonia [name]. 07/25/2023 9:30 A M Medical Record Number: [id number] Patient Account Number: [id number] Date of Birth/Sex: Treating RN: 1981/01/13 (42 y.o. [name] Primary Care Provider: PA Zenovia Jordan, NO Other Clinician: Referring Provider: Treating Provider/Extender: Geralyn Corwin Self, Referral Weeks in Treatment: 4 Constitutional . Cardiovascular . Psychiatric . Notes T the right lower extremity on the lateral aspect there is an open wound with granulation tissue and nonviable tissue. Poor edema control. Venous stasis o dermatitis. No signs of surrounding infection including increased warmth, erythema or purulent drainage. Electronic Signature(s) Signed: 07/25/2023 12:24:25 PM By: Geralyn Corwin DO Entered By: Geralyn Corwin on 07/25/2023 07:44:35 -------------------------------------------------------------------------------- Physician Orders Details Patient Name: Date of Service: [name], Ahriyah [name]. 07/25/2023 9:30 A M Medical Record Number: [id number] Patient Account Number: [id number] Date of Birth/Sex: Treating RN: [birth year] (42 y.o. [name] Primary Care Provider: PA Zenovia Jordan, [place] Other Clinician: Referring Provider: Treating Provider/Extender: Geralyn Corwin Self, Referral [place], Anner [name] ([id number]) [id number] Page 5 of 11 Weeks in Treatment: 4 Verbal / Phone Orders: No Diagnosis Coding Follow-up Appointments Return Appointment in 1 week. ppointment in: -  nurse visit (one time only) 8/30 Return A Bathing/ Shower/ Hygiene May shower with wound dressing protected with water repellent cover or cast protector. No tub bath. Anesthetic (Use 'Patient Medications' Section for Anesthetic Order Entry) Lidocaine applied to wound bed Edema Control - Lymphedema / Segmental Compressive Device / Other Optional: One layer of unna paste to top of compression wrap (to act as an anchor). UrgoK2 Wound  Treatment Wound #7 - Lower Leg Wound Laterality: Right, Lateral Cleanser: Soap and Water 2 x Per Week/30 Days Discharge Instructions: Gently cleanse wound with antibacterial soap, rinse and pat dry prior to dressing wounds Cleanser: Vashe 5.8 (oz) 2 x Per Week/30 Days Discharge Instructions: Use vashe 5.8 (oz) as directed Topical: Gentamicin 2 x Per Week/30 Days Discharge Instructions: Apply as directed by provider. Topical: Mupirocin Ointment 2 x Per Week/30 Days Discharge Instructions: Apply as directed by provider. Prim Dressing: Hydrofera Blue Ready Transfer Foam, 2.5x2.5 (in/in) 2 x Per Week/30 Days ary Discharge Instructions: Apply Hydrofera Blue Ready to wound bed as directed Secondary Dressing: ABD Pad 5x9 (in/in) 2 x Per Week/30 Days Discharge Instructions: Cover with ABD pad Compression Wrap: Urgo K2, two layer compression system, large 2 x Per Week/30 Days Electronic Signature(s) Signed: 07/25/2023 12:24:25 PM By: Geralyn Corwin DO Entered By: Geralyn Corwin on 07/25/2023 07:46:11 -------------------------------------------------------------------------------- Problem List Details Patient Name: Date of Service: [name], Marlisa [name]. 07/25/2023 9:30 A M Medical Record Number: [id number] Patient Account Number: [id number] Date of Birth/Sex: Treating RN: Mar 24, 1981 (42 y.o. [name] Primary Care Provider: PA Zenovia Jordan, [place] Other Clinician: Referring Provider: Treating Provider/Extender: Geralyn Corwin Self, Referral Weeks in Treatment: 4 Active Problems ICD-10 Encounter Code Description Active Date MDM Diagnosis FLOSSIE, HASTON [name] ([id number]) [id number] Page 6 of 11 [phone] Non-pressure chronic ulcer of other part of right lower leg with fat layer 06/27/2023 No Yes exposed I87.311 Chronic venous hypertension (idiopathic) with ulcer of right lower extremity 06/27/2023 No Yes I89.0 Lymphedema, not elsewhere classified 06/27/2023 No Yes Inactive  Problems Resolved Problems Electronic Signature(s) Signed: 07/25/2023 12:24:25 PM By: Geralyn Corwin DO Entered By: Geralyn Corwin on 07/25/2023 07:41:24 -------------------------------------------------------------------------------- Progress Note Details Patient Name: Date of Service: [name], [name] 07/25/2023 9:30 A M Medical Record Number: [id number] Patient Account Number: [id number] Date of Birth/Sex: Treating RN: Jan 24, 1981 (42 y.o. [name] Primary Care Provider: PA TIENT, NO Other Clinician: Referring Provider: Treating Provider/Extender: Geralyn Corwin Self, Referral Weeks in Treatment: 4 Subjective Chief Complaint Information obtained from Patient 06/27/2023; right lower extremity wound History of Present Illness (HPI) 42 year old patient well known to our [facility] wound care clinic where she has been seen since 2016 for bilateral lower extremity venous insufficiency disease with lymphedema and multiple ulcerations associated with morbid obesity. she had custom-made compression stockings and lymphedema pumps which were used in the past. most recently she was admitted to the hospital between October 11 and 09/02/2017 with sepsis, lower extremity wounds and lymphedema.she was initially treated in the outpatient with Keflex and Bactrim. she was initially treated in the hospital with vancomycin and Zosyn and changed over to Unasyn until her white count improved and her blood cultures were negative for 3 days. After her inpatient management she was discharged home on Augmentin to end on 09/13/2017 with a 14 day course. she has had outpatient vascular duplex scans completed in November 2017 and her right ABI was 1.1 and the left ABI is 1.3. she had normal toe brachial indices bilaterally.she had three-vessel runoff in the right lower extremity  and two-vessel runoff in the left lower extremity. On questioning the patient she does have custom made compression stockings and  also has a lymphedema pump but has not been using it appropriately and has not been taking good care of herself. 09/17/2017 -- she returns today with compression stockings on the left side and the right side has had significant amount of drainage and has a very strong odor 09/24/2017 -- the drainage is increased significantly and she has more lymphedema and a very strong odor to her wound. Though she does not have systemic symptoms, or overt infection I believe she will benefit from some doxycycline given empirically. 10/01/2017 -- after starting the doxycycline and changing the dressing twice a week her symptoms and signs have definitely improved overall. 10/08/2017 -- she has completed her course of doxycycline but continues to have a lot of drainage and needs twice a week dressing changes. 11/08/17-she is here in follow-up evaluation for right lower extremity ulcers. She admits to using her lymphedema pumps twice daily, one hour per session. she is voicing no complaints or concerns, no signs of infection will change to [facility] 12/14/17 on evaluation today patient appears to be doing very well in regard to her wounds. She has been tolerating the dressing changes she continues to develop some portly the adherent granular tissue on the surface of the wound with some Slough. Obviously we are trying to get too much better wound bed. With that being said the hyper granulation the Hydrofera Blue Dressing to have helped with which is excellent news. However I think it may be time to try SALIMAH, OTTUM [name] ([id number]) [id number] Page 7 of 11 something a little bit different at this point. 01/11/18 on evaluation today patient appears to be doing fairly well in regard to her right lateral lower extremity ulcers. This shows excellent signs of filling in which is great news. There does not appear to be any evidence of infection which is also good news. 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 She is having no pain. 01/22/18 on evaluation today patient appears to be doing a little bit worse in my opinion in regard to the overall quality of that granulation on her right lower extremity. She was not here last week due to being sick with a stomach virus this may have something to do with the fact that her wound appears to be a little bit worse. With that being said I'm also thinking that after switching from the [facility] Dressing to the silver collagen would really has not looked that's good in my opinion. We may want to swit 02/11/18 on evaluation today patient's right lower/lateral lower extremity ulcers appear to be doing very well at this point. Especially the more proximal ulcer has filled in much closer to surface which is good news. Nonetheless both show signs of improvement which is great news. There does not appear to be any evidence of infection which is also good news. In general patient has been doing well tolerating the wraps as well as the [employer]. 02/18/18 on evaluation today patient appears to be doing a little bit worse in regard to the periwound region the wounds themselves do not look much deteriorated to me. With that being said she has several small blisters/pustules noted in the periwound and there was a significant amount of drainage and maceration compared to previous. There has been a time that we had to bring her back for twice a week dressing  changes as far as her wrap was concerned it has been a while since we've done that however. With that being said the patient has been having some burning and in general I'm concerned about the possibility of infection. She has previously taken doxycycline with good result. Fortunately there does not appear to be any evidence of overall worsening in regard to the size of the wound and in fact the upper wound actually appears to be showing signs of good epithelialization. 03/18/18 on evaluation today  patient appears to be doing excellent in regard to her right lateral lower extremity ulcer. She has been tolerating the dressing changes without complication. Fortunately this seems to be making great progress. Overall I see no signs of infection and there is dramatic improvement overall even compared to last week. 03/28/18 on evaluation today patient appears to be doing very well in regard to her right lateral lower surety ulcer. She has been tolerating the dressing changes without complication at this point. She states currently that she's having no significant discomfort which is excellent as well. Overall I'm pleased with how things seem to be progressing. 04/01/18 on evaluation today patient appears to be doing excellent in regard to her right lateral lower extremity ulcers. She has been tolerating the dressing changes without complication which is good news. With that being said the wraps still continues to show signs of helping with her fluid she does have Juxta-Lite compression stockings for when we are done with the current treatment regimen once everything heals. Mainly she just has the one area still remaining the smaller of the two wings is pretty much closed at this point there's just a very slight opening noted. 04/08/18 on evaluation today patient appears to be doing better in regard to the original wound on the right lateral lower extremity that we have been managing. Unfortunately she has a new area of weeping more anterior on the right lateral lower extremity and on the left lower extremity she has two new ulcers there appears to be some cellulitis noted at this time. I am concerned about the fact that this may in fact be an infection that has caused the worsening and swelling in the past this has been the case when we previously attempted to determine what was going on when she had down slides like this. With that being said the patient is seeming to tolerate the wraps fairly well for  the most part. 04/17/18; since last time the patient was seen in this clinic she was hospitalized from 04/11/18 through 04/13/18; she presented with bilateral lower extremity pain worse on the right and a fever of up to 104. Noteworthy that when she was in the clinic last week she had new wounds on the left leg culture grew MRSA and she was prescribed Bactrim. She had 2 days of IV Vanco and Zosyn in the hospital. Her blood cultures were negative. She was discharged on Bactrim to cover the original MRSA on the right leg and Keflex to cover the possibility of strep. The hospitalist had a conversation with infectious disease. The patient arrives in clinic today for nurse check however given the recent hospitalization I was asked to look at her. The patient states she feels a lot better. No fever or chills. Still some pain in the right calf but a lot better. She arrived in the hospital with a white count of 15.6, the next day was 10.8. Comprehensive metabolic panel was normal. She is still taking Keflex and Bactrim It  would appear that she had a surgical IandD at the bedside of the right calf felt to have a underlying abscess. According to our intake nurse the wound has expanded quite bit on the right lateral calf. She has no open area on the left anterior and left posterior calf as described last time 04/23/18 on evaluation today patient actually appears to be doing much better than when I last saw her. This obviously has been a couple weeks ago and in the interim she was admitted to the hospital for IV antibiotic therapy due to cellulitis, discharge, and fortunately the substance which hadn't sued has completely resolved. Her swelling seems to be better in regard to her lower extremities as well and the wounds that opened up as results of the infection seems to be showing signs of improvement. There is some Slough noted on the left lateral wound otherwise a lot of weeping noted of the right lateral  leg. 04/29/18 on evaluation today patient appears to be doing excellent in regard to her bilateral lower extremity ulcers. She's been tolerating the dressing changes without complication there does not appear to be any evidence of infection at this time. Overall I think she is made great improvement and seems to be showing signs of coming back around where she was prior to the cellulitis and sepsis episode. 05/06/18 on evaluation today patient appears to be doing better in regard to her bilateral ocean the ulcers. She's been tolerating the dressing changes without complication. Fortunately there does not appear to be any evidence of infection at this time. Her swelling is significantly down overall seems to be showing signs of good improvement as well. Fortunately I do believe that she is progressing nicely back to where she was prior to the cellulitis/sepsis issue. 05/13/18 on evaluation today patient seems to be showing signs of great improvement she's been tolerating the dressing changes without complication and overall there does not appear to be any evidence of infection. All of which is good news. The only issue she really have today is that some of the [facility] Dressing actually was stuck to the periwound of the right lateral lower extremity however this was able to be removed during the debridement without complication. 05/21/18 on evaluation today patient appears to be doing very well in regard to her bilateral lower extremity ulcers. She has been tolerating the compression wraps without complication. There does not appear to be any evidence of infection at this point which is good news as well. Overall I'm very pleased with the progress that has been made. She likewise is very happy. 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 05/28/18 on evaluation today patient actually appears  to be doing excellent in regard to her bilateral lower [place] ulcers. She's made great progress since last week and overall I'm very pleased in this regard. She states that she's not have any discomfort either which is also good news there's definitely no evidence of infection. 06/03/18 on evaluation today patient actually appears to be doing excellent in regard to her ulcerations in fact these areas on both lower extremities were almost completely healed. Unfortunately she has a situation going on her life right now she actually found her husband deceased last 2023/09/08. She states that she has been quite a bit upset since that time unfortunately this is obviously a very big blow to her. Nonetheless she is concerned about  the funeral and having to wear certain shoes for this that she states she cannot fit in with the wraps. She wonders if she could potentially come back on Friday to have her wraps changed and to switch into her Juxta-Lite compression at that point. I think that would be an okay thing that we could do for her. Nonetheless she fortunately seems to be tolerating the wraps very well and again has made excellent progress with the [facility] Dressing 06/11/18 on evaluation today patient appears to be doing rather well in regard to her bilateral lower extremities in fact everything appears to be completely healed at this point which is excellent news. There does not appear to be any evidence of infection at this time which is great. In fact overall I do believe she is completely resolved in regard to her bilateral lower extremity ulcers. She is extremely happy in this regard. 06/27/2023 [name] ([id number]) [id number] Page 8 of 11 Ms. [name] is a 42 year old female with a past medical history of venous insufficiency and lymphedema that presents to the clinic for a 3-week history of nonhealing wound to the right lower extremity. She states this started  spontaneously. She does not wear compression stockings. She has been keeping the area covered. She currently denies signs of infection. 8/14; patient readmitted to clinic last week. She has venous insufficiency and chronic secondary lymphedema. She has a nonhealing wound on the right lateral lower leg. She has been using gent mupirocin and Hydrofera Blue using an Urgo K2 lite wrap. Her ABI on the right leg was 0.74 on admission to clinic. Formal arterial studies were ordered but we have not yet had any contact with the patient. Also there was some question about juxta lites for this patient we have not heard anything about that either. 8/28; patient presents for follow-up. She had ABIs completed on 8/23 that showed noncompressible ABIs on the right and left with TBI of 0.88 on the left and 0.96 on the right. She had triphasic waveforms throughout the arterial system. This is reassuring that she has adequate blood flow for healing. We have been using antibiotic ointment with Hydrofera Blue under Urgo K2 lite wrap. She has tolerated this well. Wound is smaller. 9/4; patient presents for follow-up. We have been using Urgo 2 regular4-layer equivalent as well as Hydrofera Blue and antibiotic ointment. Wound is slightly smaller. She has no issues or complaints. She tolerated the wrap well. Objective Constitutional Vitals Time Taken: 10:12 AM, Height: 73 in, Weight: 436 lbs, BMI: 57.5, Temperature: 97.5 F, Pulse: 76 bpm, Respiratory Rate: 18 breaths/min, Blood Pressure: 114/67 mmHg. General Notes: T the right lower extremity on the lateral aspect there is an open wound with granulation tissue and nonviable tissue. Poor edema control. o Venous stasis dermatitis. No signs of surrounding infection including increased warmth, erythema or purulent drainage. Integumentary (Hair, Skin) Wound #7 status is Open. Original cause of wound was Gradually Appeared. The date acquired was: 06/06/2023. The wound has been  in treatment 4 weeks. The wound is located on the Right,Lateral Lower Leg. The wound measures 1.5cm length x 1.7cm width x 0.1cm depth; 2.003cm^2 area and 0.2cm^3 volume. There is Fat Layer (Subcutaneous Tissue) exposed. There is a none present amount of drainage noted. There is small (1-33%) red, pink granulation within the wound bed. There is a medium (34-66%) amount of necrotic tissue within the wound bed including Adherent Slough. 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 I debrided nonviable tissue. I recommended continuing the course with antibiotic ointment and Hydrofera Blue under 4- layer equivalent, Urgo 2. Follow-up in 1 week. Procedures Wound #7 Pre-procedure diagnosis of Wound #7 is a Venous Leg Ulcer located on the Right,Lateral Lower Leg .Severity of Tissue Pre Debridement is: Fat layer exposed. There was a Selective/Open Wound Non-Viable Tissue Debridement with a total area of 2 sq cm performed by Geralyn Corwin, MD. With the following instrument(s): Curette to remove Viable and Non-Viable tissue/material. Material removed includes [facility] after achieving pain control using Lidocaine 4% Topical Solution. No specimens were taken. A time out was conducted at 10:35, prior to the start of the procedure. A Minimum amount of bleeding was controlled with Pressure. The procedure was tolerated well with a pain level of 0 throughout and a pain level of 0 following the procedure. Post Debridement Measurements: 1.5cm length x 1.7cm width x 0.1cm depth; 0.2cm^3 volume. Character of Wound/Ulcer Post Debridement is stable. Severity of Tissue Post Debridement is: Fat layer exposed. Post procedure Diagnosis Wound #7: Same as Pre-Procedure Pre-procedure diagnosis of Wound  #7 is a Venous Leg Ulcer located on the Right,Lateral Lower Leg . There was a Four Layer Compression Therapy Procedure by Midge Aver, RN. Post procedure Diagnosis Wound #7: Same as Pre-Procedure Plan TAWANYA, PEREYRA [name] ([id number]) [id number] Page 9 of 11 Follow-up Appointments: Return Appointment in 1 week. Return Appointment in: - nurse visit (one time only) 8/30 Bathing/ Shower/ Hygiene: May shower with wound dressing protected with water repellent cover or cast protector. No tub bath. Anesthetic (Use 'Patient Medications' Section for Anesthetic Order Entry): Lidocaine applied to wound bed Edema Control - Lymphedema / Segmental Compressive Device / Other: Optional: One layer of unna paste to top of compression wrap (to act as an anchor). UrgoK2 WOUND #7: - Lower Leg Wound Laterality: Right, Lateral Cleanser: Soap and Water 2 x Per Week/30 Days Discharge Instructions: Gently cleanse wound with antibacterial soap, rinse and pat dry prior to dressing wounds Cleanser: Vashe 5.8 (oz) 2 x Per Week/30 Days Discharge Instructions: Use vashe 5.8 (oz) as directed Topical: Gentamicin 2 x Per Week/30 Days Discharge Instructions: Apply as directed by provider. Topical: Mupirocin Ointment 2 x Per Week/30 Days Discharge Instructions: Apply as directed by provider. Prim Dressing: Hydrofera Blue Ready Transfer Foam, 2.5x2.5 (in/in) 2 x Per Week/30 Days ary Discharge Instructions: Apply Hydrofera Blue Ready to wound bed as directed Secondary Dressing: ABD Pad 5x9 (in/in) 2 x Per Week/30 Days Discharge Instructions: Cover with ABD pad Com pression Wrap: Urgo K2, two layer compression system, large 2 x Per Week/30 Days 1. In office sharp debridement 2. Hydrofera Blue and antibiotic ointment under Urgo 2right lower extremity 3. Follow-up in 1 week Electronic Signature(s) Signed: 07/25/2023 12:24:25 PM By: Geralyn Corwin DO Entered By: Geralyn Corwin on 07/25/2023  07:45:40 -------------------------------------------------------------------------------- ROS/PFSH Details Patient Name: Date of Service: [name], [name] 07/25/2023 9:30 A M Medical Record Number: [id number] Patient Account Number: [id number] Date of Birth/Sex: Treating RN: Jul 07, 1981 (42 y.o. [name] Primary Care Provider: PA Zenovia Jordan, NO Other Clinician: Referring Provider: Treating Provider/Extender: Geralyn Corwin Self, Referral Weeks in Treatment: 4 Information Obtained From Patient Hematologic/Lymphatic Medical History: Positive for: Lymphedema Respiratory Medical History: Negative for: Aspiration; Asthma; Chronic Obstructive Pulmonary Disease (COPD); Pneumothorax; Sleep Apnea; Tuberculosis Cardiovascular Medical History: Positive for: Hypertension Negative for: Angina;  Arrhythmia; Congestive Heart Failure; Coronary Artery Disease; Deep Vein Thrombosis; Hypotension; Myocardial Infarction; Peripheral Arterial Disease; Peripheral Venous Disease; Phlebitis; Vasculitis Losier, [name] ([id number]) [id number].pdf Page 10 of 11 Endocrine Medical History: Negative for: Type I Diabetes; Type II Diabetes Immunizations Pneumococcal Vaccine: Received Pneumococcal Vaccination: No Immunization Notes: up to date Implantable Devices None Family and Social History Cancer: No; Diabetes: No; Heart Disease: No; Hereditary Spherocytosis: No; Hypertension: No; Kidney Disease: Yes - Mother; Lung Disease: No; Seizures: No; Stroke: No; Thyroid Problems: No; Tuberculosis: No; Never smoker; Marital Status - Widowed; Alcohol Use: Never; Drug Use: No History; Caffeine Use: Daily; Financial Concerns: No; Food, Clothing or Shelter Needs: No; Support System Lacking: No; Transportation Concerns: No Electronic Signature(s) Signed: 07/25/2023 12:24:25 PM By: Geralyn Corwin DO Signed: 07/26/2023 5:01:27 PM By: Midge Aver MSN RN CNS WTA Entered By: Geralyn Corwin on  07/25/2023 07:46:26 -------------------------------------------------------------------------------- SuperBill Details Patient Name: Date of Service: [name], [name] 07/25/2023 Medical Record Number: [id number] Patient Account Number: [id number] Date of Birth/Sex: Treating RN: 12/18/1980 (42 y.o. [name] Primary Care Provider: PA Zenovia Jordan, NO Other Clinician: Referring Provider: Treating Provider/Extender: Geralyn Corwin Self, Referral Weeks in Treatment: 4 Diagnosis Coding ICD-10 Codes Code Description [phone] Non-pressure chronic ulcer of other part of right lower leg with fat layer exposed I87.311 Chronic venous hypertension (idiopathic) with ulcer of right lower extremity I89.0 Lymphedema, not elsewhere classified Facility Procedures : CPT4 Code: [id number] Description: 97597 - DEBRIDE WOUND 1ST 20 SQ CM OR < ICD-10 Diagnosis Description L97.812 Non-pressure chronic ulcer of other part of right lower leg with fat layer expos I87.311 Chronic venous hypertension (idiopathic) with ulcer of right lower  extremity I89.0 Lymphedema, not elsewhere classified Modifier: ed Quantity: 1 Physician Procedures : CPT4 Code Description Modifier [id number] - WC PHYS DEBR WO ANESTH 20 SQ CM ICD-10 Diagnosis Description L97.812 Non-pressure chronic ulcer of other part of right lower leg with fat layer exposed I87.311 Chronic venous hypertension (idiopathic) with  ulcer of right lower extremity I89.0 Lymphedema, not elsewhere classified Coen, Elane [name] ([id number]) [id number] P Quantity: 1 age 61 of 31 Electronic Signature(s) Signed: 07/25/2023 12:24:25 PM By: Geralyn Corwin DO Signed: 07/26/2023 5:01:27 PM By: Midge Aver MSN RN CNS WTA Entered By: Midge Aver on 07/25/2023 07:52:08

## 2023-07-27 NOTE — Progress Notes (Signed)
TYLASHIA, SCHAFER [name] ([id number]) [id number] Page 1 of 10 Visit Report for 07/25/2023 Arrival Information Details Patient Name: Date of Service: [name], [name] 07/25/2023 9:30 A M Medical Record Number: [id number] Patient Account Number: [id number] Date of Birth/Sex: Treating RN: 06-10-1981 (42 y.o. [name] Primary Care Tashan Kreitzer: PA Zenovia Jordan, NO Other Clinician: Referring Shakeera Rightmyer: Treating Dawsyn Zurn/Extender: Geralyn Corwin Self, Referral Weeks in Treatment: 4 Visit Information History Since Last Visit Added or deleted any medications: No Patient Arrived: Ambulatory Any new allergies or adverse reactions: No Arrival Time: 10:11 Has Dressing in Place as Prescribed: Yes Accompanied By: self Has Compression in Place as Prescribed: Yes Transfer Assistance: None Pain Present Now: No Patient Identification Verified: Yes Secondary Verification Process Completed: Yes Patient Requires Transmission-Based Precautions: No Patient Has Alerts: No Electronic Signature(s) Signed: 07/25/2023 3:26:51 PM By: Midge Aver MSN RN CNS WTA Entered By: Midge Aver on 07/25/2023 12:26:50 -------------------------------------------------------------------------------- Clinic Level of Care Assessment Details Patient Name: Date of Service: [name], [name] 07/25/2023 9:30 A M Medical Record Number: [id number] Patient Account Number: [id number] Date of Birth/Sex: Treating RN: September 11, 1981 (42 y.o. [name] Primary Care Jorie Zee: PA Zenovia Jordan, NO Other Clinician: Referring [name] Slawson: Treating Othniel Maret/Extender: Geralyn Corwin Self, Referral Weeks in Treatment: 4 Clinic Level of Care Assessment Items TOOL 1 Quantity Score []  - 0 Use when EandM and Procedure is performed on INITIAL visit ASSESSMENTS - Nursing Assessment / Reassessment []  - 0 General Physical Exam (combine w/ comprehensive assessment (listed just below) when performed on new pt. evals) []  - 0 Comprehensive  Assessment (HX, ROS, Risk Assessments, Wounds Hx, etc.) ASSESSMENTS - Wound and Skin Assessment / Reassessment []  - 0 Dermatologic / Skin Assessment (not related to wound area) ASSESSMENTS - Ostomy and/or Continence Assessment and Care BELYNDA, [name] ([id number]) [id number] Page 2 of 10 []  - 0 Incontinence Assessment and Management []  - 0 Ostomy Care Assessment and Management (repouching, etc.) PROCESS - Coordination of Care []  - 0 Simple Patient / Family Education for ongoing care []  - 0 Complex (extensive) Patient / Family Education for ongoing care []  - 0 Staff obtains Consents, Records, T Results / Process Orders est []  - 0 Staff telephones HHA, Nursing Homes / Clarify orders / etc []  - 0 Routine Transfer to another Facility (non-emergent condition) []  - 0 Routine [name] Admission (non-emergent condition) []  - 0 New Admissions / Manufacturing engineer / Ordering NPWT Apligraf, etc. , []  - 0 Emergency [name] Admission (emergent condition) PROCESS - Special Needs []  - 0 Pediatric / Minor Patient Management []  - 0 Isolation Patient Management []  - 0 Hearing / Language / Visual special needs []  - 0 Assessment of Jessica assistance (transportation, D/C planning, etc.) []  - 0 Additional assistance / Altered mentation []  - 0 Support Surface(s) Assessment (bed, cushion, seat, etc.) INTERVENTIONS - Miscellaneous []  - 0 External ear exam []  - 0 Patient Transfer (multiple staff / Nurse, adult / Similar devices) []  - 0 Simple Staple / Suture removal (25 or less) []  - 0 Complex Staple / Suture removal (26 or more) []  - 0 Hypo/Hyperglycemic Management (do not check if billed separately) []  - 0 Ankle / Brachial Index (ABI) - do not check if billed separately Has the patient been seen at the [name] within the last three years: Yes Total Score: 0 Level Of Care: ____ Electronic Signature(s) Signed: 07/26/2023 5:01:27 PM By: Midge Aver MSN  RN CNS WTA Entered By: Midge Aver on 07/25/2023 07:51:49 -------------------------------------------------------------------------------- Compression Therapy Details Patient Name: Date of  Service: [name], [name] 07/25/2023 9:30 A M Medical Record Number: [id number] Patient Account Number: [id number] Date of Birth/Sex: Treating RN: 10-24-1981 (42 y.o. [name] Primary Care Kendallyn Lippold: PA Zenovia Jordan, NO Other Clinician: Referring Dazani Norby: Treating Surafel Hilleary/Extender: Geralyn Corwin Self, Referral Weeks in Treatment: 4 Compression Therapy Performed for Wound Assessment: Wound #7 Right,Lateral Lower Leg Performed By: Clinician Midge Aver, RN Compression Type: [place] Patricia ([id number]) [id number] Page 3 of 10 Post Procedure Diagnosis Same as Pre-procedure Electronic Signature(s) Signed: 07/26/2023 5:01:27 PM By: Midge Aver MSN RN CNS WTA Entered By: Midge Aver on 07/25/2023 07:39:31 -------------------------------------------------------------------------------- Encounter Discharge Information Details Patient Name: Date of Service: [name], [name] 07/25/2023 9:30 A M Medical Record Number: [id number] Patient Account Number: [id number] Date of Birth/Sex: Treating RN: September 01, 1981 (42 y.o. [name] Primary Care Daphne Karrer: PA Zenovia Jordan, NO Other Clinician: Referring Gerron Guidotti: Treating Maizey Menendez/Extender: Geralyn Corwin Self, Referral Weeks in Treatment: 4 Encounter Discharge Information Items Post Procedure Vitals Discharge Condition: Stable Temperature (F): 97.5 Ambulatory Status: Ambulatory Pulse (bpm): 76 Discharge Destination: Home Respiratory Rate (breaths/min): 18 Transportation: Private Auto Blood Pressure (mmHg): 114/67 Accompanied By: self Schedule Follow-up Appointment: Yes Clinical Summary of Care: Electronic Signature(s) Signed: 07/25/2023 3:28:29 PM By: Midge Aver MSN RN CNS WTA Entered By: Midge Aver on 07/25/2023  12:28:29 -------------------------------------------------------------------------------- Lower Extremity Assessment Details Patient Name: Date of Service: [name], Lovenia [name]. 07/25/2023 9:30 A M Medical Record Number: [id number] Patient Account Number: [id number] Date of Birth/Sex: Treating RN: [birth year] (42 y.o. [name] Primary Care Tavaughn Silguero: PA Zenovia Jordan, NO Other Clinician: Referring Marlane Hirschmann: Treating Kendyll Huettner/Extender: Geralyn Corwin Self, Referral Weeks in Treatment: 4 Edema Assessment Assessed: [Left: No] [Right: No] [Left: Edema] [Right: :] Calf Left: RightROSAELENA, CROOKER [name] ([id number]) [id number] Page 4 of 10 Point of Measurement: 38 cm From Medial Instep 54 cm Ankle Left: Right: Point of Measurement: 15 cm From Medial Instep 42 cm Vascular Assessment Pulses: Dorsalis Pedis Palpable: [Right:Yes] Extremity colors, hair growth, and conditions: Extremity Color: [Right:Dusky] Hair Growth on Extremity: [Right:No] Temperature of Extremity: [Right:Warm] Capillary Refill: [Right:< 3 seconds] Dependent Rubor: [Right:No] Blanched when Elevated: [Right:No No] Toe Nail Assessment Left: Right: Thick: Yes Discolored: Yes Deformed: Yes Improper Length and Hygiene: Yes Electronic Signature(s) Signed: 07/25/2023 3:27:14 PM By: Midge Aver MSN RN CNS WTA Entered By: Midge Aver on 07/25/2023 12:27:14 -------------------------------------------------------------------------------- Multi Wound Chart Details Patient Name: Date of Service: [name], [name] 07/25/2023 9:30 A M Medical Record Number: [id number] Patient Account Number: [id number] Date of Birth/Sex: Treating RN: [birth year] (42 y.o. [name] Primary Care Priest Lockridge: PA Zenovia Jordan, NO Other Clinician: Referring Cheridan Kibler: Treating Cailee Blanke/Extender: Geralyn Corwin Self, Referral Weeks in Treatment: 4 Vital Signs Height(in): 73 Pulse(bpm): 76 Weight(lbs): 436 Blood Pressure(mmHg):  114/67 Body Mass Index(BMI): 57.5 Temperature(F): 97.5 Respiratory Rate(breaths/min): 18 [7:Photos:] [N/A:N/A] Right, Lateral Lower Leg N/A N/A Wound Location: CHAVI, BALLARDO [name] ([id number]) [id number] Page 5 of 10 Gradually Appeared N/A N/A Wounding Event: Venous Leg Ulcer N/A N/A Primary Etiology: Lymphedema, Hypertension N/A N/A Comorbid History: 06/06/2023 N/A N/A Date Acquired: 4 N/A N/A Weeks of Treatment: Open N/A N/A Wound Status: No N/A N/A Wound Recurrence: 1.5x1.7x0.1 N/A N/A Measurements L x W x D (cm) 2.003 N/A N/A A (cm) : rea 0.2 N/A N/A Volume (cm) : 42.00% N/A N/A % Reduction in A rea: 80.70% N/A N/A % Reduction in Volume: Full Thickness Without Exposed N/A N/A Classification: Support Structures None Present N/A N/A Exudate A mount: Small (1-33%) N/A N/A Granulation A  mount: Red, Pink N/A N/A Granulation Quality: Medium (34-66%) N/A N/A Necrotic A mount: Fat Layer (Subcutaneous Tissue): Yes N/A N/A Exposed Structures: Fascia: No Tendon: No Muscle: No Joint: No Bone: No None N/A N/A Epithelialization: Debridement - Selective/Open Wound N/A N/A Debridement: Pre-procedure Verification/Time Out 10:35 N/A N/A Taken: Lidocaine 4% Topical Solution N/A N/A Pain Control: Slough N/A N/A Tissue Debrided: Non-Viable Tissue N/A N/A Level: 2 N/A N/A Debridement A (sq cm): rea Curette N/A N/A Instrument: Minimum N/A N/A Bleeding: Pressure N/A N/A Hemostasis A chieved: 0 N/A N/A Procedural Pain: 0 N/A N/A Post Procedural Pain: Procedure was tolerated well N/A N/A Debridement Treatment Response: 1.5x1.7x0.1 N/A N/A Post Debridement Measurements L x W x D (cm) 0.2 N/A N/A Post Debridement Volume: (cm) Compression Therapy N/A N/A Procedures Performed: Debridement Treatment Notes Wound #7 (Lower Leg) Wound Laterality: Right, Lateral Cleanser Soap and Water Discharge Instruction: Gently cleanse wound with  antibacterial soap, rinse and pat dry prior to dressing wounds Vashe 5.8 (oz) Discharge Instruction: Use vashe 5.8 (oz) as directed Peri-Wound Care Topical Gentamicin Discharge Instruction: Apply as directed by Eris Hannan. Mupirocin Ointment Discharge Instruction: Apply as directed by Skyy Nilan. Primary Dressing Hydrofera Blue Ready Transfer Foam, 2.5x2.5 (in/in) Discharge Instruction: Apply Hydrofera Blue Ready to wound bed as directed Secondary Dressing ABD Pad 5x9 (in/in) Discharge Instruction: Cover with ABD pad Secured With Compression Wrap Urgo K2, two layer compression system, large Compression Stockings Add-Ons YASHA, URENA [name] ([id number]) [id number] Page 6 of 10 Electronic Signature(s) Signed: 07/25/2023 3:27:25 PM By: Midge Aver MSN RN CNS WTA Entered By: Midge Aver on 07/25/2023 12:27:25 -------------------------------------------------------------------------------- Multi-Disciplinary Care Plan Details Patient Name: Date of Service: [name], [name] 07/25/2023 9:30 A M Medical Record Number: [id number] Patient Account Number: [id number] Date of Birth/Sex: Treating RN: [birth year] (42 y.o. [name] Primary Care Ahsley Attwood: PA Zenovia Jordan, NO Other Clinician: Referring Jadarian Mckay: Treating Jetta Murray/Extender: Geralyn Corwin Self, Referral Weeks in Treatment: 4 Active Inactive Venous Leg Ulcer Nursing Diagnoses: Actual venous Insuffiency (use after diagnosis is confirmed) Knowledge deficit related to disease process and management Goals: Patient will maintain optimal edema control Date Initiated: 06/27/2023 Target Resolution Date: 07/28/2023 Goal Status: Active Patient/caregiver will verbalize understanding of disease process and disease management Date Initiated: 06/27/2023 Target Resolution Date: 07/28/2023 Goal Status: Active Verify adequate tissue perfusion prior to therapeutic compression application Date Initiated: 06/27/2023 Target Resolution  Date: 07/28/2023 Goal Status: Active Interventions: Assess peripheral edema status every visit. Compression as ordered Provide education on venous insufficiency Treatment Activities: Non-invasive vascular studies : 06/27/2023 T ordered outside of clinic : 06/27/2023 est Therapeutic compression applied : 06/27/2023 Notes: Wound/Skin Impairment Nursing Diagnoses: Impaired tissue integrity Knowledge deficit related to ulceration/compromised skin integrity Goals: Patient/caregiver will verbalize understanding of skin care regimen Date Initiated: 06/27/2023 Target Resolution Date: 07/28/2023 Goal Status: Active Ulcer/skin breakdown will have a volume reduction of 30% by week 4 Date Initiated: 06/27/2023 Target Resolution Date: 07/28/2023 Goal Status: Active Ulcer/skin breakdown will have a volume reduction of 50% by week 8 Date Initiated: 06/27/2023 Target Resolution Date: 08/27/2023 Goal Status: Active ASHANTY, MANYGOATS ([id number]) [phone].pdf Page 7 of 10 Ulcer/skin breakdown will have a volume reduction of 80% by week 12 Date Initiated: 06/27/2023 Target Resolution Date: 09/27/2023 Goal Status: Active Interventions: Assess patient/caregiver ability to obtain necessary supplies Assess patient/caregiver ability to perform ulcer/skin care regimen upon admission and as needed Assess ulceration(s) every visit Provide education on ulcer and skin care Treatment Activities: Skin care regimen initiated : 06/27/2023 Notes: Electronic Signature(s) Signed: 07/26/2023  5:01:27 PM By: Midge Aver MSN RN CNS WTA Entered By: Midge Aver on 07/25/2023 07:52:24 -------------------------------------------------------------------------------- Pain Assessment Details Patient Name: Date of Service: [name], [name] 07/25/2023 9:30 A M Medical Record Number: [id number] Patient Account Number: [id number] Date of Birth/Sex: Treating RN: 03-22-1981 (42 y.o. [name] Primary Care Daphney Hopke: PA  Zenovia Jordan, NO Other Clinician: Referring Alysah Carton: Treating Tyquavious Gamel/Extender: Geralyn Corwin Self, Referral Weeks in Treatment: 4 Active Problems Location of Pain Severity and Description of Pain Patient Has Paino No Site Locations Pain Management and Medication Current Pain Management: Electronic Signature(s) Signed: 07/25/2023 3:27:01 PM By: Midge Aver MSN RN CNS WTA Entered By: Midge Aver on 07/25/2023 12:27:01 [name] [name] ([id number]) [id number] Page 8 of 10 -------------------------------------------------------------------------------- Patient/Caregiver Education Details Patient Name: Date of Service: [facility] 9/4/2024andnbsp9:30 A M Medical Record Number: [id number] Patient Account Number: [id number] Date of Birth/Gender: Treating RN: 1981-09-05 (42 y.o. [name] Primary Care Physician: PA Zenovia Jordan, NO Other Clinician: Referring Physician: Treating Physician/Extender: Geralyn Corwin Self, Referral Weeks in Treatment: 4 Education Assessment Education Provided To: Patient Education Topics Provided Wound Debridement: Handouts: Wound Debridement Methods: Explain/Verbal Responses: State content correctly Wound/Skin Impairment: Handouts: Caring for Your Ulcer Methods: Explain/Verbal Responses: State content correctly Electronic Signature(s) Signed: 07/26/2023 5:01:27 PM By: Midge Aver MSN RN CNS WTA Entered By: Midge Aver on 07/25/2023 07:52:44 -------------------------------------------------------------------------------- Wound Assessment Details Patient Name: Date of Service: [name], [name] 07/25/2023 9:30 A M Medical Record Number: [id number] Patient Account Number: [id number] Date of Birth/Sex: Treating RN: Feb 26, 1981 (42 y.o. [name] Primary Care Zsazsa Bahena: PA Zenovia Jordan, NO Other Clinician: Referring Kenzlie Disch: Treating Cathey Fredenburg/Extender: Geralyn Corwin Self, Referral Weeks in Treatment: 4 Wound Status Wound  Number: 7 Primary Etiology: Venous Leg Ulcer Wound Location: Right, Lateral Lower Leg Wound Status: Open Wounding Event: Gradually Appeared Comorbid History: Lymphedema, Hypertension Date Acquired: 06/06/2023 Weeks Of Treatment: 4 Clustered Wound: No Kivi, Aubreyana [name] ([id number]) [id number] Page 9 of 10 Photos Wound Measurements Length: (cm) 1.5 Width: (cm) 1.7 Depth: (cm) 0.1 Area: (cm) 2.003 Volume: (cm) 0.2 % Reduction in Area: 42% % Reduction in Volume: 80.7% Epithelialization: None Wound Description Classification: Full Thickness Without Exposed Suppor Exudate Amount: None Present t Structures Foul Odor After Cleansing: No Slough/Fibrino Yes Wound Bed Granulation Amount: Small (1-33%) Exposed Structure Granulation Quality: Red, Pink Fascia Exposed: No Necrotic Amount: Medium (34-66%) Fat Layer (Subcutaneous Tissue) Exposed: Yes Necrotic Quality: Adherent Slough Tendon Exposed: No Muscle Exposed: No Joint Exposed: No Bone Exposed: No Treatment Notes Wound #7 (Lower Leg) Wound Laterality: Right, Lateral Cleanser Soap and Water Discharge Instruction: Gently cleanse wound with antibacterial soap, rinse and pat dry prior to dressing wounds Vashe 5.8 (oz) Discharge Instruction: Use vashe 5.8 (oz) as directed Peri-Wound Care Topical Gentamicin Discharge Instruction: Apply as directed by Alaynna Kerwood. Mupirocin Ointment Discharge Instruction: Apply as directed by Deronte Solis. Primary Dressing Hydrofera Blue Ready Transfer Foam, 2.5x2.5 (in/in) Discharge Instruction: Apply Hydrofera Blue Ready to wound bed as directed Secondary Dressing ABD Pad 5x9 (in/in) Discharge Instruction: Cover with ABD pad Secured With Compression Wrap Urgo K2, two layer compression system, large Compression Stockings Add-Ons Electronic Signature(s) Signed: 07/26/2023 5:01:27 PM By: Midge Aver MSN RN CNS WTA Entered By: Midge Aver on 07/25/2023 07:18:38 Valeriano, Glendola  [name] ([id number]) [id number] Page 10 of 10 -------------------------------------------------------------------------------- Vitals Details Patient Name: Date of Service: [name], [name] 07/25/2023 9:30 A M Medical Record Number: [id number] Patient Account Number: [id number] Date of Birth/Sex: Treating RN: 1981/05/19 (42 y.o. [name]  Primary Care Ilyssa Grennan: PA TIENT, NO Other Clinician: Referring Nealy Karapetian: Treating Nyrie Sigal/Extender: Geralyn Corwin Self, Referral Weeks in Treatment: 4 Vital Signs Time Taken: 10:12 Temperature (F): 97.5 Height (in): 73 Pulse (bpm): 76 Weight (lbs): 436 Respiratory Rate (breaths/min): 18 Body Mass Index (BMI): 57.5 Blood Pressure (mmHg): 114/67 Reference Range: 80 - 120 mg / dl Electronic Signature(s) Signed: 07/25/2023 3:26:54 PM By: Midge Aver MSN RN CNS WTA Entered By: Midge Aver on 07/25/2023 12:26:53

## 2023-07-30 ENCOUNTER — Ambulatory Visit: Payer: Self-pay

## 2023-08-01 ENCOUNTER — Encounter (HOSPITAL_BASED_OUTPATIENT_CLINIC_OR_DEPARTMENT_OTHER): Payer: 59 | Admitting: Internal Medicine

## 2023-08-01 DIAGNOSIS — I87311 Chronic venous hypertension (idiopathic) with ulcer of right lower extremity: Secondary | ICD-10-CM

## 2023-08-01 DIAGNOSIS — L97812 Non-pressure chronic ulcer of other part of right lower leg with fat layer exposed: Secondary | ICD-10-CM

## 2023-08-01 DIAGNOSIS — I890 Lymphedema, not elsewhere classified: Secondary | ICD-10-CM

## 2023-08-03 NOTE — Progress Notes (Signed)
[name]: Fat Layer (Subcutaneous Tissue): Yes N/A N/A Exposed Structures: Fascia: No Tendon: No Muscle: No Joint: No Bone: No None N/A N/A Epithelialization: Treatment Notes Electronic Signature(s) Signed: 08/03/2023 12:41:22 PM By: Midge Aver MSN RN CNS WTA Entered By: Midge Aver on 08/01/2023 09:08:37 -------------------------------------------------------------------------------- Multi-Disciplinary Care Plan Details Patient Name: Date of Service: [name], [name] 08/01/2023 8:30 A M Medical Record Number: [id number] Patient Account Number: [id number] Date of Birth/Sex: Treating RN: 06-Aug-1981 (42 y.o. [name] Primary Care Azlin Zilberman: PA Zenovia Jordan, NO Other Clinician: Referring Oluwanifemi Petitti: Treating [name]/Extender: Geralyn Corwin Self, Referral Weeks in Treatment: 5 Active Inactive Venous Leg Ulcer Nursing Diagnoses: Actual venous Insuffiency (use after diagnosis is confirmed) Knowledge deficit related to disease process and management Goals: Patient will  maintain optimal edema control Date Initiated: 06/27/2023 Date Inactivated: 08/01/2023 Target Resolution Date: 07/28/2023 Goal Status: Met Patient/caregiver will verbalize understanding of disease process and disease management Date Initiated: 06/27/2023 Date Inactivated: 08/01/2023 Target Resolution Date: 07/28/2023 Goal Status: Met Verify adequate tissue perfusion prior to therapeutic compression application Date Initiated: 06/27/2023 Target Resolution Date: 08/27/2023 Goal Status: Active Interventions: Assess peripheral edema status every visit. NATTALIE, MAGBANUA R ([id number]) [id number] Page 6 of 9 Compression as ordered Provide education on venous insufficiency Treatment Activities: Non-invasive vascular studies : 06/27/2023 T ordered outside of clinic : 06/27/2023 est Therapeutic compression applied : 06/27/2023 Notes: Wound/Skin Impairment Nursing Diagnoses: Impaired tissue integrity Knowledge deficit related to ulceration/compromised skin integrity Goals: Patient/caregiver will verbalize understanding of skin care regimen Date Initiated: 06/27/2023 Date Inactivated: 08/01/2023 Target Resolution Date: 07/28/2023 Goal Status: Met Ulcer/skin breakdown will have a volume reduction of 30% by week 4 Date Initiated: 06/27/2023 Date Inactivated: 08/01/2023 Target Resolution Date: 07/28/2023 Goal Status: Met Ulcer/skin breakdown will have a volume reduction of 50% by week 8 Date Initiated: 06/27/2023 Target Resolution Date: 08/27/2023 Goal Status: Active Ulcer/skin breakdown will have a volume reduction of 80% by week 12 Date Initiated: 06/27/2023 Target Resolution Date: 09/27/2023 Goal Status: Active Interventions: Assess patient/caregiver ability to obtain necessary supplies Assess patient/caregiver ability to perform ulcer/skin care regimen upon admission and as needed Assess ulceration(s) every visit Provide education on ulcer and skin care Treatment Activities: Skin care  regimen initiated : 06/27/2023 Notes: Electronic Signature(s) Signed: 08/03/2023 12:41:22 PM By: Midge Aver MSN RN CNS WTA Entered By: Midge Aver on 08/01/2023 09:11:40 -------------------------------------------------------------------------------- Pain Assessment Details Patient Name: Date of Service: [name], [name] 08/01/2023 8:30 A M Medical Record Number: [id number] Patient Account Number: [id number] Date of Birth/Sex: Treating RN: Jul 08, 1981 (42 y.o. [name] Primary Care Jayme Cham: PA Zenovia Jordan, NO Other Clinician: Referring Ori Trejos: Treating Harjit Leider/Extender: Geralyn Corwin Self, Referral Weeks in Treatment: 5 Active Problems Location of Pain Severity and Description of Pain Patient Has Paino No Site Locations [place], [place] R ([id number]) [phone].pdf Page 7 of 9 Pain Management and Medication Current Pain Management: Electronic Signature(s) Signed: 08/03/2023 12:41:22 PM By: Midge Aver MSN RN CNS WTA Entered By: Midge Aver on 08/01/2023 09:00:13 -------------------------------------------------------------------------------- Patient/Caregiver Education Details Patient Name: Date of Service: [name], [name] 9/11/2024andnbsp8:30 A M Medical Record Number: [id number] Patient Account Number: [id number] Date of Birth/Gender: Treating RN: [birth year] (42 y.o. [name] Primary Care Physician: PA Zenovia Jordan, NO Other Clinician: Referring Physician: Treating Physician/Extender: Geralyn Corwin Self, Referral Weeks in Treatment: 5 Education Assessment Education Provided To: Patient Education Topics Provided Wound Debridement: Handouts: Wound Debridement Methods: Explain/Verbal Responses: State content correctly Wound/Skin Impairment: Handouts: Caring for Your Ulcer Methods: Explain/Verbal Responses: State content correctly  A M Medical Record Number: [id number] Patient Account Number: [id number] Date of Birth/Sex: Treating RN: 1981/05/30 (42 y.o. [name] Primary Care Izak Anding: PA Zenovia Jordan, NO Other Clinician: Referring Izumi Mixon: Treating Sandra Brents/Extender: Geralyn Corwin Self, Referral Weeks in Treatment: 5 Compression Therapy Performed for Wound Assessment: Wound #7 Right,Lateral Lower Leg Performed By: Clinician Midge Aver, RN Compression Type: Four Pleas Patricia ([id number]) [id number] Page 3 of 9 Post Procedure Diagnosis Same as Pre-procedure Electronic Signature(s) Signed: 08/03/2023 12:41:22 PM By: Midge Aver MSN RN CNS WTA Entered By: Midge Aver on 08/01/2023 09:10:23 -------------------------------------------------------------------------------- Encounter Discharge Information Details Patient Name: Date of Service: [name], [name] 08/01/2023 8:30 A M Medical Record Number: [id number] Patient Account Number: [id number] Date of Birth/Sex: Treating RN: [birth year] (42 y.o. [name] Primary Care Sargon Scouten: PA Zenovia Jordan, NO Other Clinician: Referring Shamarcus Hoheisel: Treating Myrl Lazarus/Extender: Geralyn Corwin Self, Referral Weeks in Treatment: 5 Encounter Discharge Information Items Post Procedure Vitals Discharge Condition: Stable Temperature (F): 97.5 Ambulatory Status: Ambulatory Pulse (bpm): 91 Discharge Destination: Home Respiratory Rate (breaths/min): 18 Transportation: Private Auto Blood Pressure (mmHg): 127/84 Accompanied By: self Schedule Follow-up Appointment: Yes Clinical Summary of Care: Electronic Signature(s) Signed: 08/03/2023 12:41:22 PM By: Midge Aver MSN RN CNS WTA Entered By: Midge Aver on 08/01/2023  09:12:45 -------------------------------------------------------------------------------- Lower Extremity Assessment Details Patient Name: Date of Service: [facility], Tianna R. 08/01/2023 8:30 A M Medical Record Number: [id number] Patient Account Number: [id number] Date of Birth/Sex: Treating RN: Apr 05, 1981 (42 y.o. [name] Primary Care Elizzie Westergard: PA Zenovia Jordan, NO Other Clinician: Referring Jinnie Onley: Treating Cher Egnor/Extender: Geralyn Corwin Self, Referral Weeks in Treatment: 5 Edema Assessment Assessed: [Left: No] [Right: No] [Left: Edema] [Right: :] Calf Left: RightSHAMBRE, PENNYCUFF R ([id number]) [id number] Page 4 of 9 Point of Measurement: 38 cm From Medial Instep 59 cm Ankle Left: Right: Point of Measurement: 13 cm From Medial Instep 40.5 cm Vascular Assessment Pulses: Dorsalis Pedis Palpable: [Right:Yes] Extremity colors, hair growth, and conditions: Extremity Color: [Right:Dusky] Hair Growth on Extremity: [Right:No] Temperature of Extremity: [Right:Warm] Capillary Refill: [Right:< 3 seconds] Dependent Rubor: [Right:No] Blanched when Elevated: [Right:No No] Toe Nail Assessment Left: Right: Thick: Yes Discolored: Yes Deformed: Yes Improper Length and Hygiene: Yes Electronic Signature(s) Signed: 08/03/2023 12:41:22 PM By: Midge Aver MSN RN CNS WTA Entered By: Midge Aver on 08/01/2023 09:05:16 -------------------------------------------------------------------------------- Multi Wound Chart Details Patient Name: Date of Service: [name], Sady R. 08/01/2023 8:30 A M Medical Record Number: [id number] Patient Account Number: [id number] Date of Birth/Sex: Treating RN: 02-09-1981 (42 y.o. [name] Primary Care Artina Minella: PA Zenovia Jordan, NO Other Clinician: Referring Timonthy Hovater: Treating Shela Esses/Extender: Geralyn Corwin Self, Referral Weeks in Treatment: 5 Vital Signs Height(in): 73 Pulse(bpm): 91 Weight(lbs): 436 Blood Pressure(mmHg):  127/84 Body Mass Index(BMI): 57.5 Temperature(F): 97.5 Respiratory Rate(breaths/min): 18 [7:Photos:] [N/A:N/A] Right, Lateral Lower Leg N/A N/A Wound Location: AUBREANA, GOLDE R ([id number]) [id number] Page 5 of 9 Gradually Appeared N/A N/A Wounding Event: Venous Leg Ulcer N/A N/A Primary Etiology: Lymphedema, Hypertension N/A N/A Comorbid History: 06/06/2023 N/A N/A Date Acquired: 5 N/A N/A Weeks of Treatment: Open N/A N/A Wound Status: No N/A N/A Wound Recurrence: 1.5x1.5x0.2 N/A N/A Measurements L x W x D (cm) 1.767 N/A N/A A (cm) : rea 0.353 N/A N/A Volume (cm) : 48.90% N/A N/A % Reduction in A rea: 66.00% N/A N/A % Reduction in Volume: Full Thickness Without Exposed N/A N/A Classification: Support Structures None Present N/A N/A Exudate Amount: Small (1-33%) N/A N/A Granulation Amount: Red, Pink N/A N/A Granulation Quality: Medium (34-66%)  A M Medical Record Number: [id number] Patient Account Number: [id number] Date of Birth/Sex: Treating RN: 1981/05/30 (42 y.o. [name] Primary Care Izak Anding: PA Zenovia Jordan, NO Other Clinician: Referring Izumi Mixon: Treating Sandra Brents/Extender: Geralyn Corwin Self, Referral Weeks in Treatment: 5 Compression Therapy Performed for Wound Assessment: Wound #7 Right,Lateral Lower Leg Performed By: Clinician Midge Aver, RN Compression Type: Four Pleas Patricia ([id number]) [id number] Page 3 of 9 Post Procedure Diagnosis Same as Pre-procedure Electronic Signature(s) Signed: 08/03/2023 12:41:22 PM By: Midge Aver MSN RN CNS WTA Entered By: Midge Aver on 08/01/2023 09:10:23 -------------------------------------------------------------------------------- Encounter Discharge Information Details Patient Name: Date of Service: [name], [name] 08/01/2023 8:30 A M Medical Record Number: [id number] Patient Account Number: [id number] Date of Birth/Sex: Treating RN: [birth year] (42 y.o. [name] Primary Care Sargon Scouten: PA Zenovia Jordan, NO Other Clinician: Referring Shamarcus Hoheisel: Treating Myrl Lazarus/Extender: Geralyn Corwin Self, Referral Weeks in Treatment: 5 Encounter Discharge Information Items Post Procedure Vitals Discharge Condition: Stable Temperature (F): 97.5 Ambulatory Status: Ambulatory Pulse (bpm): 91 Discharge Destination: Home Respiratory Rate (breaths/min): 18 Transportation: Private Auto Blood Pressure (mmHg): 127/84 Accompanied By: self Schedule Follow-up Appointment: Yes Clinical Summary of Care: Electronic Signature(s) Signed: 08/03/2023 12:41:22 PM By: Midge Aver MSN RN CNS WTA Entered By: Midge Aver on 08/01/2023  09:12:45 -------------------------------------------------------------------------------- Lower Extremity Assessment Details Patient Name: Date of Service: [facility], Tianna R. 08/01/2023 8:30 A M Medical Record Number: [id number] Patient Account Number: [id number] Date of Birth/Sex: Treating RN: Apr 05, 1981 (42 y.o. [name] Primary Care Elizzie Westergard: PA Zenovia Jordan, NO Other Clinician: Referring Jinnie Onley: Treating Cher Egnor/Extender: Geralyn Corwin Self, Referral Weeks in Treatment: 5 Edema Assessment Assessed: [Left: No] [Right: No] [Left: Edema] [Right: :] Calf Left: RightSHAMBRE, PENNYCUFF R ([id number]) [id number] Page 4 of 9 Point of Measurement: 38 cm From Medial Instep 59 cm Ankle Left: Right: Point of Measurement: 13 cm From Medial Instep 40.5 cm Vascular Assessment Pulses: Dorsalis Pedis Palpable: [Right:Yes] Extremity colors, hair growth, and conditions: Extremity Color: [Right:Dusky] Hair Growth on Extremity: [Right:No] Temperature of Extremity: [Right:Warm] Capillary Refill: [Right:< 3 seconds] Dependent Rubor: [Right:No] Blanched when Elevated: [Right:No No] Toe Nail Assessment Left: Right: Thick: Yes Discolored: Yes Deformed: Yes Improper Length and Hygiene: Yes Electronic Signature(s) Signed: 08/03/2023 12:41:22 PM By: Midge Aver MSN RN CNS WTA Entered By: Midge Aver on 08/01/2023 09:05:16 -------------------------------------------------------------------------------- Multi Wound Chart Details Patient Name: Date of Service: [name], Sady R. 08/01/2023 8:30 A M Medical Record Number: [id number] Patient Account Number: [id number] Date of Birth/Sex: Treating RN: 02-09-1981 (42 y.o. [name] Primary Care Artina Minella: PA Zenovia Jordan, NO Other Clinician: Referring Timonthy Hovater: Treating Shela Esses/Extender: Geralyn Corwin Self, Referral Weeks in Treatment: 5 Vital Signs Height(in): 73 Pulse(bpm): 91 Weight(lbs): 436 Blood Pressure(mmHg):  127/84 Body Mass Index(BMI): 57.5 Temperature(F): 97.5 Respiratory Rate(breaths/min): 18 [7:Photos:] [N/A:N/A] Right, Lateral Lower Leg N/A N/A Wound Location: AUBREANA, GOLDE R ([id number]) [id number] Page 5 of 9 Gradually Appeared N/A N/A Wounding Event: Venous Leg Ulcer N/A N/A Primary Etiology: Lymphedema, Hypertension N/A N/A Comorbid History: 06/06/2023 N/A N/A Date Acquired: 5 N/A N/A Weeks of Treatment: Open N/A N/A Wound Status: No N/A N/A Wound Recurrence: 1.5x1.5x0.2 N/A N/A Measurements L x W x D (cm) 1.767 N/A N/A A (cm) : rea 0.353 N/A N/A Volume (cm) : 48.90% N/A N/A % Reduction in A rea: 66.00% N/A N/A % Reduction in Volume: Full Thickness Without Exposed N/A N/A Classification: Support Structures None Present N/A N/A Exudate Amount: Small (1-33%) N/A N/A Granulation Amount: Red, Pink N/A N/A Granulation Quality: Medium (34-66%)  [name]: Fat Layer (Subcutaneous Tissue): Yes N/A N/A Exposed Structures: Fascia: No Tendon: No Muscle: No Joint: No Bone: No None N/A N/A Epithelialization: Treatment Notes Electronic Signature(s) Signed: 08/03/2023 12:41:22 PM By: Midge Aver MSN RN CNS WTA Entered By: Midge Aver on 08/01/2023 09:08:37 -------------------------------------------------------------------------------- Multi-Disciplinary Care Plan Details Patient Name: Date of Service: [name], [name] 08/01/2023 8:30 A M Medical Record Number: [id number] Patient Account Number: [id number] Date of Birth/Sex: Treating RN: 06-Aug-1981 (42 y.o. [name] Primary Care Azlin Zilberman: PA Zenovia Jordan, NO Other Clinician: Referring Oluwanifemi Petitti: Treating [name]/Extender: Geralyn Corwin Self, Referral Weeks in Treatment: 5 Active Inactive Venous Leg Ulcer Nursing Diagnoses: Actual venous Insuffiency (use after diagnosis is confirmed) Knowledge deficit related to disease process and management Goals: Patient will  maintain optimal edema control Date Initiated: 06/27/2023 Date Inactivated: 08/01/2023 Target Resolution Date: 07/28/2023 Goal Status: Met Patient/caregiver will verbalize understanding of disease process and disease management Date Initiated: 06/27/2023 Date Inactivated: 08/01/2023 Target Resolution Date: 07/28/2023 Goal Status: Met Verify adequate tissue perfusion prior to therapeutic compression application Date Initiated: 06/27/2023 Target Resolution Date: 08/27/2023 Goal Status: Active Interventions: Assess peripheral edema status every visit. NATTALIE, MAGBANUA R ([id number]) [id number] Page 6 of 9 Compression as ordered Provide education on venous insufficiency Treatment Activities: Non-invasive vascular studies : 06/27/2023 T ordered outside of clinic : 06/27/2023 est Therapeutic compression applied : 06/27/2023 Notes: Wound/Skin Impairment Nursing Diagnoses: Impaired tissue integrity Knowledge deficit related to ulceration/compromised skin integrity Goals: Patient/caregiver will verbalize understanding of skin care regimen Date Initiated: 06/27/2023 Date Inactivated: 08/01/2023 Target Resolution Date: 07/28/2023 Goal Status: Met Ulcer/skin breakdown will have a volume reduction of 30% by week 4 Date Initiated: 06/27/2023 Date Inactivated: 08/01/2023 Target Resolution Date: 07/28/2023 Goal Status: Met Ulcer/skin breakdown will have a volume reduction of 50% by week 8 Date Initiated: 06/27/2023 Target Resolution Date: 08/27/2023 Goal Status: Active Ulcer/skin breakdown will have a volume reduction of 80% by week 12 Date Initiated: 06/27/2023 Target Resolution Date: 09/27/2023 Goal Status: Active Interventions: Assess patient/caregiver ability to obtain necessary supplies Assess patient/caregiver ability to perform ulcer/skin care regimen upon admission and as needed Assess ulceration(s) every visit Provide education on ulcer and skin care Treatment Activities: Skin care  regimen initiated : 06/27/2023 Notes: Electronic Signature(s) Signed: 08/03/2023 12:41:22 PM By: Midge Aver MSN RN CNS WTA Entered By: Midge Aver on 08/01/2023 09:11:40 -------------------------------------------------------------------------------- Pain Assessment Details Patient Name: Date of Service: [name], [name] 08/01/2023 8:30 A M Medical Record Number: [id number] Patient Account Number: [id number] Date of Birth/Sex: Treating RN: Jul 08, 1981 (42 y.o. [name] Primary Care Jayme Cham: PA Zenovia Jordan, NO Other Clinician: Referring Ori Trejos: Treating Harjit Leider/Extender: Geralyn Corwin Self, Referral Weeks in Treatment: 5 Active Problems Location of Pain Severity and Description of Pain Patient Has Paino No Site Locations [place], [place] R ([id number]) [phone].pdf Page 7 of 9 Pain Management and Medication Current Pain Management: Electronic Signature(s) Signed: 08/03/2023 12:41:22 PM By: Midge Aver MSN RN CNS WTA Entered By: Midge Aver on 08/01/2023 09:00:13 -------------------------------------------------------------------------------- Patient/Caregiver Education Details Patient Name: Date of Service: [name], [name] 9/11/2024andnbsp8:30 A M Medical Record Number: [id number] Patient Account Number: [id number] Date of Birth/Gender: Treating RN: [birth year] (42 y.o. [name] Primary Care Physician: PA Zenovia Jordan, NO Other Clinician: Referring Physician: Treating Physician/Extender: Geralyn Corwin Self, Referral Weeks in Treatment: 5 Education Assessment Education Provided To: Patient Education Topics Provided Wound Debridement: Handouts: Wound Debridement Methods: Explain/Verbal Responses: State content correctly Wound/Skin Impairment: Handouts: Caring for Your Ulcer Methods: Explain/Verbal Responses: State content correctly

## 2023-08-03 NOTE — Progress Notes (Signed)
[name], [name] ([id number]) [id number] Page 1 of 11 Visit Report for 08/01/2023 Chief Complaint Document Details Patient Name: Date of Service: [name], [name] 08/01/2023 8:30 A M Medical Record Number: [id number] Patient Account Number: [id number] Date of Birth/Sex: Treating RN: December 13, 1980 (42 y.o. [name] Primary Care Provider: PA Zenovia Jordan, [place] Other Clinician: Referring Provider: Treating Provider/Extender: Geralyn Corwin Self, Referral Weeks in Treatment: 5 Information Obtained from: Patient Chief Complaint 06/27/2023; right lower extremity wound Electronic Signature(s) Signed: 08/01/2023 1:14:22 PM By: Geralyn Corwin DO Entered By: Geralyn Corwin on 08/01/2023 09:12:11 -------------------------------------------------------------------------------- Debridement Details Patient Name: Date of Service: [name], [name] 08/01/2023 8:30 A M Medical Record Number: [id number] Patient Account Number: [id number] Date of Birth/Sex: Treating RN: 07-15-1981 (42 y.o. [name] Primary Care Provider: PA Zenovia Jordan, [place] Other Clinician: Referring Provider: Treating Provider/Extender: Geralyn Corwin Self, Referral Weeks in Treatment: 5 Debridement Performed for Assessment: Wound #7 Right,Lateral Lower Leg Performed By: Physician Geralyn Corwin, MD Debridement Type: Debridement Severity of Tissue Pre Debridement: Fat layer exposed Level of Consciousness (Pre-procedure): Awake and Alert Pre-procedure Verification/Time Out Yes - 09:08 Taken: Start Time: 09:08 Pain Control: Lidocaine 4% T opical Solution Percent of Wound Bed Debrided: 100% T Area Debrided (cm): otal 1.77 Tissue and other material debrided: Viable, Non-Viable, Slough, Slough Level: Non-Viable Tissue Debridement Description: Selective/Open Wound Instrument: Curette Bleeding: Minimum Hemostasis Achieved: Pressure Procedural Pain: 0 Post Procedural Pain: 0 [name], [name] ([id number])  [id number] Page 2 of 11 Response to Treatment: Procedure was tolerated well Level of Consciousness (Post- Awake and Alert procedure): Post Debridement Measurements of Total Wound Length: (cm) 1.5 Width: (cm) 1.5 Depth: (cm) 0.2 Volume: (cm) 0.353 Character of Wound/Ulcer Post Debridement: Stable Severity of Tissue Post Debridement: Fat layer exposed Post Procedure Diagnosis Same as Pre-procedure Electronic Signature(s) Signed: 08/01/2023 1:14:22 PM By: Geralyn Corwin DO Signed: 08/03/2023 12:41:22 PM By: Midge Aver MSN RN CNS WTA Entered By: Midge Aver on 08/01/2023 09:10:05 -------------------------------------------------------------------------------- HPI Details Patient Name: Date of Service: [name], [name] 08/01/2023 8:30 A M Medical Record Number: [id number] Patient Account Number: [id number] Date of Birth/Sex: Treating RN: 26-Feb-1981 (42 y.o. [name] Primary Care Provider: PA Zenovia Jordan, NO Other Clinician: Referring Provider: Treating Provider/Extender: Geralyn Corwin Self, Referral Weeks in Treatment: 5 History of Present Illness HPI Description: 42 year old patient well known to our [facility] wound care clinic where she has been seen since 2016 for bilateral lower extremity venous insufficiency disease with lymphedema and multiple ulcerations associated with morbid obesity. she had custom-made compression stockings and lymphedema pumps which were used in the past. most recently she was admitted to the [name] between October 11 and 09/02/2017 with sepsis, lower extremity wounds and lymphedema.she was initially treated in the outpatient with Keflex and Bactrim. she was initially treated in the [name] with vancomycin and Zosyn and changed over to Unasyn until her white count improved and her blood cultures were negative for 3 days. After her inpatient management she was discharged home on Augmentin to end on 09/13/2017 with a 14 day  course. she has had outpatient vascular duplex scans completed in November 2017 and her right ABI was 1.1 and the left ABI is 1.3. she had normal toe brachial indices bilaterally.she had three-vessel runoff in the right lower extremity and two-vessel runoff in the left lower extremity. On questioning the patient she does have custom made compression stockings and also has a lymphedema pump but has not been using it appropriately and has not been taking good care  [name], [name] ([id number]) [id number] Page 1 of 11 Visit Report for 08/01/2023 Chief Complaint Document Details Patient Name: Date of Service: [name], [name] 08/01/2023 8:30 A M Medical Record Number: [id number] Patient Account Number: [id number] Date of Birth/Sex: Treating RN: December 13, 1980 (42 y.o. [name] Primary Care Provider: PA Zenovia Jordan, [place] Other Clinician: Referring Provider: Treating Provider/Extender: Geralyn Corwin Self, Referral Weeks in Treatment: 5 Information Obtained from: Patient Chief Complaint 06/27/2023; right lower extremity wound Electronic Signature(s) Signed: 08/01/2023 1:14:22 PM By: Geralyn Corwin DO Entered By: Geralyn Corwin on 08/01/2023 09:12:11 -------------------------------------------------------------------------------- Debridement Details Patient Name: Date of Service: [name], [name] 08/01/2023 8:30 A M Medical Record Number: [id number] Patient Account Number: [id number] Date of Birth/Sex: Treating RN: 07-15-1981 (42 y.o. [name] Primary Care Provider: PA Zenovia Jordan, [place] Other Clinician: Referring Provider: Treating Provider/Extender: Geralyn Corwin Self, Referral Weeks in Treatment: 5 Debridement Performed for Assessment: Wound #7 Right,Lateral Lower Leg Performed By: Physician Geralyn Corwin, MD Debridement Type: Debridement Severity of Tissue Pre Debridement: Fat layer exposed Level of Consciousness (Pre-procedure): Awake and Alert Pre-procedure Verification/Time Out Yes - 09:08 Taken: Start Time: 09:08 Pain Control: Lidocaine 4% T opical Solution Percent of Wound Bed Debrided: 100% T Area Debrided (cm): otal 1.77 Tissue and other material debrided: Viable, Non-Viable, Slough, Slough Level: Non-Viable Tissue Debridement Description: Selective/Open Wound Instrument: Curette Bleeding: Minimum Hemostasis Achieved: Pressure Procedural Pain: 0 Post Procedural Pain: 0 [name], [name] ([id number])  [id number] Page 2 of 11 Response to Treatment: Procedure was tolerated well Level of Consciousness (Post- Awake and Alert procedure): Post Debridement Measurements of Total Wound Length: (cm) 1.5 Width: (cm) 1.5 Depth: (cm) 0.2 Volume: (cm) 0.353 Character of Wound/Ulcer Post Debridement: Stable Severity of Tissue Post Debridement: Fat layer exposed Post Procedure Diagnosis Same as Pre-procedure Electronic Signature(s) Signed: 08/01/2023 1:14:22 PM By: Geralyn Corwin DO Signed: 08/03/2023 12:41:22 PM By: Midge Aver MSN RN CNS WTA Entered By: Midge Aver on 08/01/2023 09:10:05 -------------------------------------------------------------------------------- HPI Details Patient Name: Date of Service: [name], [name] 08/01/2023 8:30 A M Medical Record Number: [id number] Patient Account Number: [id number] Date of Birth/Sex: Treating RN: 26-Feb-1981 (42 y.o. [name] Primary Care Provider: PA Zenovia Jordan, NO Other Clinician: Referring Provider: Treating Provider/Extender: Geralyn Corwin Self, Referral Weeks in Treatment: 5 History of Present Illness HPI Description: 42 year old patient well known to our [facility] wound care clinic where she has been seen since 2016 for bilateral lower extremity venous insufficiency disease with lymphedema and multiple ulcerations associated with morbid obesity. she had custom-made compression stockings and lymphedema pumps which were used in the past. most recently she was admitted to the [name] between October 11 and 09/02/2017 with sepsis, lower extremity wounds and lymphedema.she was initially treated in the outpatient with Keflex and Bactrim. she was initially treated in the [name] with vancomycin and Zosyn and changed over to Unasyn until her white count improved and her blood cultures were negative for 3 days. After her inpatient management she was discharged home on Augmentin to end on 09/13/2017 with a 14 day  course. she has had outpatient vascular duplex scans completed in November 2017 and her right ABI was 1.1 and the left ABI is 1.3. she had normal toe brachial indices bilaterally.she had three-vessel runoff in the right lower extremity and two-vessel runoff in the left lower extremity. On questioning the patient she does have custom made compression stockings and also has a lymphedema pump but has not been using it appropriately and has not been taking good care  PM By: Geralyn Corwin DO Entered By: Geralyn Corwin on 08/01/2023 09:14:28 -------------------------------------------------------------------------------- ROS/PFSH Details Patient Name: Date of Service: [name], [name] 08/01/2023 8:30 A M Medical Record Number: [id number] Patient Account Number: [id number] Date of Birth/Sex: Treating RN: 03-04-1981 (42 y.o. [name] Primary Care Provider: PA Zenovia Jordan, [place] Other Clinician: Referring Provider: Treating Provider/Extender: Geralyn Corwin Self, Referral Weeks in Treatment: 5 Information Obtained From Patient Hematologic/Lymphatic Medical History: Positive for: Lymphedema Respiratory Medical History: Negative for: Aspiration; Asthma; Chronic Obstructive Pulmonary Disease (COPD); Pneumothorax; Sleep Apnea; Tuberculosis Cardiovascular Medical HistorySHELIA, CRUMBAKER ([id number]) [id number] Page 10 of 11 Positive for: Hypertension Negative for: Angina; Arrhythmia; Congestive Heart Failure; Coronary Artery Disease; Deep Vein Thrombosis; Hypotension; Myocardial Infarction; Peripheral Arterial Disease; Peripheral Venous Disease; Phlebitis; Vasculitis Endocrine Medical History: Negative for: Type I Diabetes; Type II Diabetes Immunizations Pneumococcal Vaccine: Received Pneumococcal Vaccination: No Immunization Notes: up to date Implantable Devices None Family and Social History Cancer: No;  Diabetes: No; Heart Disease: No; Hereditary Spherocytosis: No; Hypertension: No; Kidney Disease: Yes - Mother; Lung Disease: No; Seizures: No; Stroke: No; Thyroid Problems: No; Tuberculosis: No; Never smoker; Marital Status - Widowed; Alcohol Use: Never; Drug Use: No History; Caffeine Use: Daily; Financial Concerns: No; Food, Clothing or Shelter Needs: No; Support System Lacking: No; Transportation Concerns: No Electronic Signature(s) Signed: 08/01/2023 1:14:22 PM By: Geralyn Corwin DO Signed: 08/03/2023 12:41:22 PM By: Midge Aver MSN RN CNS WTA Entered By: Geralyn Corwin on 08/01/2023 09:14:55 -------------------------------------------------------------------------------- SuperBill Details Patient Name: Date of Service: [name], Danya [name]. 08/01/2023 Medical Record Number: [id number] Patient Account Number: [id number] Date of Birth/Sex: Treating RN: October 16, 1981 (42 y.o. [name] Primary Care Provider: PA Zenovia Jordan, [place] Other Clinician: Referring Provider: Treating Provider/Extender: Geralyn Corwin Self, Referral Weeks in Treatment: 5 Diagnosis Coding ICD-10 Codes Code Description [phone] Non-pressure chronic ulcer of other part of right lower leg with fat layer exposed I87.311 Chronic venous hypertension (idiopathic) with ulcer of right lower extremity I89.0 Lymphedema, not elsewhere classified Facility Procedures : CPT4 Code: 41324401 Description: 97597 - DEBRIDE WOUND 1ST 20 SQ CM OR < ICD-10 Diagnosis Description L97.812 Non-pressure chronic ulcer of other part of right lower leg with fat layer expos I87.311 Chronic venous hypertension (idiopathic) with ulcer of right lower  extremity I89.0 Lymphedema, not elsewhere classified Modifier: ed Quantity: 1 Physician Procedures : CPT4 Code Description Modifier [id number] 97597 - WC PHYS DEBR WO ANESTH 20 SQ CM ICD-10 Diagnosis Description CHIRSTY, BARTMESS [name] ([id number]) [id number] L97.812 Non-pressure chronic ulcer of  other part of right lower leg with fat layer  exposed I87.311 Chronic venous hypertension (idiopathic) with ulcer of right lower extremity I89.0 Lymphedema, not elsewhere classified Quantity: 1 17.pdf Page 11 of 11 Electronic Signature(s) Signed: 08/01/2023 1:14:22 PM By: Geralyn Corwin DO Entered By: Geralyn Corwin on 08/01/2023 09:14:37  PM By: Geralyn Corwin DO Entered By: Geralyn Corwin on 08/01/2023 09:14:28 -------------------------------------------------------------------------------- ROS/PFSH Details Patient Name: Date of Service: [name], [name] 08/01/2023 8:30 A M Medical Record Number: [id number] Patient Account Number: [id number] Date of Birth/Sex: Treating RN: 03-04-1981 (42 y.o. [name] Primary Care Provider: PA Zenovia Jordan, [place] Other Clinician: Referring Provider: Treating Provider/Extender: Geralyn Corwin Self, Referral Weeks in Treatment: 5 Information Obtained From Patient Hematologic/Lymphatic Medical History: Positive for: Lymphedema Respiratory Medical History: Negative for: Aspiration; Asthma; Chronic Obstructive Pulmonary Disease (COPD); Pneumothorax; Sleep Apnea; Tuberculosis Cardiovascular Medical HistorySHELIA, CRUMBAKER ([id number]) [id number] Page 10 of 11 Positive for: Hypertension Negative for: Angina; Arrhythmia; Congestive Heart Failure; Coronary Artery Disease; Deep Vein Thrombosis; Hypotension; Myocardial Infarction; Peripheral Arterial Disease; Peripheral Venous Disease; Phlebitis; Vasculitis Endocrine Medical History: Negative for: Type I Diabetes; Type II Diabetes Immunizations Pneumococcal Vaccine: Received Pneumococcal Vaccination: No Immunization Notes: up to date Implantable Devices None Family and Social History Cancer: No;  Diabetes: No; Heart Disease: No; Hereditary Spherocytosis: No; Hypertension: No; Kidney Disease: Yes - Mother; Lung Disease: No; Seizures: No; Stroke: No; Thyroid Problems: No; Tuberculosis: No; Never smoker; Marital Status - Widowed; Alcohol Use: Never; Drug Use: No History; Caffeine Use: Daily; Financial Concerns: No; Food, Clothing or Shelter Needs: No; Support System Lacking: No; Transportation Concerns: No Electronic Signature(s) Signed: 08/01/2023 1:14:22 PM By: Geralyn Corwin DO Signed: 08/03/2023 12:41:22 PM By: Midge Aver MSN RN CNS WTA Entered By: Geralyn Corwin on 08/01/2023 09:14:55 -------------------------------------------------------------------------------- SuperBill Details Patient Name: Date of Service: [name], Danya [name]. 08/01/2023 Medical Record Number: [id number] Patient Account Number: [id number] Date of Birth/Sex: Treating RN: October 16, 1981 (42 y.o. [name] Primary Care Provider: PA Zenovia Jordan, NO Other Clinician: Referring Provider: Treating Provider/Extender: Geralyn Corwin Self, Referral Weeks in Treatment: 5 Diagnosis Coding ICD-10 Codes Code Description [phone] Non-pressure chronic ulcer of other part of right lower leg with fat layer exposed I87.311 Chronic venous hypertension (idiopathic) with ulcer of right lower extremity I89.0 Lymphedema, not elsewhere classified Facility Procedures : CPT4 Code: 41324401 Description: 97597 - DEBRIDE WOUND 1ST 20 SQ CM OR < ICD-10 Diagnosis Description L97.812 Non-pressure chronic ulcer of other part of right lower leg with fat layer expos I87.311 Chronic venous hypertension (idiopathic) with ulcer of right lower  extremity I89.0 Lymphedema, not elsewhere classified Modifier: ed Quantity: 1 Physician Procedures : CPT4 Code Description Modifier [id number] 97597 - WC PHYS DEBR WO ANESTH 20 SQ CM ICD-10 Diagnosis Description CHIRSTY, BARTMESS [name] ([id number]) [id number] L97.812 Non-pressure chronic ulcer of  other part of right lower leg with fat layer  exposed I87.311 Chronic venous hypertension (idiopathic) with ulcer of right lower extremity I89.0 Lymphedema, not elsewhere classified Quantity: 1 17.pdf Page 11 of 11 Electronic Signature(s) Signed: 08/01/2023 1:14:22 PM By: Geralyn Corwin DO Entered By: Geralyn Corwin on 08/01/2023 09:14:37  [name], [name] ([id number]) [id number] Page 1 of 11 Visit Report for 08/01/2023 Chief Complaint Document Details Patient Name: Date of Service: [name], [name] 08/01/2023 8:30 A M Medical Record Number: [id number] Patient Account Number: [id number] Date of Birth/Sex: Treating RN: December 13, 1980 (42 y.o. [name] Primary Care Provider: PA Zenovia Jordan, [place] Other Clinician: Referring Provider: Treating Provider/Extender: Geralyn Corwin Self, Referral Weeks in Treatment: 5 Information Obtained from: Patient Chief Complaint 06/27/2023; right lower extremity wound Electronic Signature(s) Signed: 08/01/2023 1:14:22 PM By: Geralyn Corwin DO Entered By: Geralyn Corwin on 08/01/2023 09:12:11 -------------------------------------------------------------------------------- Debridement Details Patient Name: Date of Service: [name], [name] 08/01/2023 8:30 A M Medical Record Number: [id number] Patient Account Number: [id number] Date of Birth/Sex: Treating RN: 07-15-1981 (42 y.o. [name] Primary Care Provider: PA Zenovia Jordan, NO Other Clinician: Referring Provider: Treating Provider/Extender: Geralyn Corwin Self, Referral Weeks in Treatment: 5 Debridement Performed for Assessment: Wound #7 Right,Lateral Lower Leg Performed By: Physician Geralyn Corwin, MD Debridement Type: Debridement Severity of Tissue Pre Debridement: Fat layer exposed Level of Consciousness (Pre-procedure): Awake and Alert Pre-procedure Verification/Time Out Yes - 09:08 Taken: Start Time: 09:08 Pain Control: Lidocaine 4% T opical Solution Percent of Wound Bed Debrided: 100% T Area Debrided (cm): otal 1.77 Tissue and other material debrided: Viable, Non-Viable, Slough, Slough Level: Non-Viable Tissue Debridement Description: Selective/Open Wound Instrument: Curette Bleeding: Minimum Hemostasis Achieved: Pressure Procedural Pain: 0 Post Procedural Pain: 0 [name], [name] ([id number])  [id number] Page 2 of 11 Response to Treatment: Procedure was tolerated well Level of Consciousness (Post- Awake and Alert procedure): Post Debridement Measurements of Total Wound Length: (cm) 1.5 Width: (cm) 1.5 Depth: (cm) 0.2 Volume: (cm) 0.353 Character of Wound/Ulcer Post Debridement: Stable Severity of Tissue Post Debridement: Fat layer exposed Post Procedure Diagnosis Same as Pre-procedure Electronic Signature(s) Signed: 08/01/2023 1:14:22 PM By: Geralyn Corwin DO Signed: 08/03/2023 12:41:22 PM By: Midge Aver MSN RN CNS WTA Entered By: Midge Aver on 08/01/2023 09:10:05 -------------------------------------------------------------------------------- HPI Details Patient Name: Date of Service: [name], [name] 08/01/2023 8:30 A M Medical Record Number: [id number] Patient Account Number: [id number] Date of Birth/Sex: Treating RN: 26-Feb-1981 (42 y.o. [name] Primary Care Provider: PA Zenovia Jordan, NO Other Clinician: Referring Provider: Treating Provider/Extender: Geralyn Corwin Self, Referral Weeks in Treatment: 5 History of Present Illness HPI Description: 42 year old patient well known to our [facility] wound care clinic where she has been seen since 2016 for bilateral lower extremity venous insufficiency disease with lymphedema and multiple ulcerations associated with morbid obesity. she had custom-made compression stockings and lymphedema pumps which were used in the past. most recently she was admitted to the [name] between October 11 and 09/02/2017 with sepsis, lower extremity wounds and lymphedema.she was initially treated in the outpatient with Keflex and Bactrim. she was initially treated in the [name] with vancomycin and Zosyn and changed over to Unasyn until her white count improved and her blood cultures were negative for 3 days. After her inpatient management she was discharged home on Augmentin to end on 09/13/2017 with a 14 day  course. she has had outpatient vascular duplex scans completed in November 2017 and her right ABI was 1.1 and the left ABI is 1.3. she had normal toe brachial indices bilaterally.she had three-vessel runoff in the right lower extremity and two-vessel runoff in the left lower extremity. On questioning the patient she does have custom made compression stockings and also has a lymphedema pump but has not been using it appropriately and has not been taking good care  Juxta-Lite compression at that point. I think that would be an okay thing that we could do for her. Nonetheless she fortunately seems to be tolerating the wraps very well and again has made excellent progress with the [name] 06/11/18 on evaluation today patient appears to be doing rather well in regard to her bilateral lower extremities in fact everything appears to be completely healed at this point which is excellent news. There does not appear to be any evidence of infection at this time which is great. In fact overall I do believe she is completely resolved in regard to her bilateral  lower extremity ulcers. She is extremely happy in this regard. 06/27/2023 Ms. Alyasia Dubose is a 42 year old female with a past medical history of venous insufficiency and lymphedema that presents to the clinic for a 3-week history of nonhealing wound to the right lower extremity. She states this started spontaneously. She does not wear compression stockings. She has been keeping the area covered. She currently denies signs of infection. 8/14; patient readmitted to clinic last week. She has venous insufficiency and chronic secondary lymphedema. She has a nonhealing wound on the right lateral lower leg. She has been using gent mupirocin and Hydrofera Blue using an Urgo K2 lite wrap. [name], [name] ([id number]) [id number] Page 4 of 11 Her ABI on the right leg was 0.74 on admission to clinic. Formal arterial studies were ordered but we have not yet had any contact with the patient. Also there was some question about juxta lites for this patient we have not heard anything about that either. 8/28; patient presents for follow-up. She had ABIs completed on 8/23 that showed noncompressible ABIs on the right and left with TBI of 0.88 on the left and 0.96 on the right. She had triphasic waveforms throughout the arterial system. This is reassuring that she has adequate blood flow for healing. We have been using antibiotic ointment with Hydrofera Blue under Urgo K2 lite wrap. She has tolerated this well. Wound is smaller. 9/4; patient presents for follow-up. We have been using Urgo 2 regular4-layer equivalent as well as Hydrofera Blue and antibiotic ointment. Wound is slightly smaller. She has no issues or complaints. She tolerated the wrap well. 9/11; patient presents for follow-up. We have been using Hydrofera Blue with antibiotic ointment under Urgo regular compression. She states the wrap slid down on Sunday and she has been keeping the area covered for the past 2 to 3  days. Electronic Signature(s) Signed: 08/01/2023 1:14:22 PM By: Geralyn Corwin DO Entered By: Geralyn Corwin on 08/01/2023 09:12:54 -------------------------------------------------------------------------------- Physical Exam Details Patient Name: Date of Service: [name], Kayslee [name]. 08/01/2023 8:30 A M Medical Record Number: [id number] Patient Account Number: [id number] Date of Birth/Sex: Treating RN: 1981/01/14 (42 y.o. [name] Primary Care Provider: PA Zenovia Jordan, NO Other Clinician: Referring Provider: Treating Provider/Extender: Geralyn Corwin Self, Referral Weeks in Treatment: 5 Constitutional . Cardiovascular . Psychiatric . Notes T the right lower extremity on the lateral aspect there is an open wound with granulation tissue and nonviable tissue. Poor edema control. Venous stasis o dermatitis. No signs of surrounding infection including increased warmth, erythema or purulent drainage. Electronic Signature(s) Signed: 08/01/2023 1:14:22 PM By: Geralyn Corwin DO Entered By: Geralyn Corwin on 08/01/2023 09:13:17 -------------------------------------------------------------------------------- Physician Orders Details Patient Name: Date of Service: [name], Ambert [name]. 08/01/2023 8:30 A M Medical Record Number: [id number] Patient Account Number: [id number] Date of Birth/Sex: Treating RN: 1981/07/23 (42 y.o. Aaruhi, Flitton, Kaysee [name] ([id number]) [id number]  Juxta-Lite compression at that point. I think that would be an okay thing that we could do for her. Nonetheless she fortunately seems to be tolerating the wraps very well and again has made excellent progress with the [name] 06/11/18 on evaluation today patient appears to be doing rather well in regard to her bilateral lower extremities in fact everything appears to be completely healed at this point which is excellent news. There does not appear to be any evidence of infection at this time which is great. In fact overall I do believe she is completely resolved in regard to her bilateral  lower extremity ulcers. She is extremely happy in this regard. 06/27/2023 Ms. Alyasia Dubose is a 42 year old female with a past medical history of venous insufficiency and lymphedema that presents to the clinic for a 3-week history of nonhealing wound to the right lower extremity. She states this started spontaneously. She does not wear compression stockings. She has been keeping the area covered. She currently denies signs of infection. 8/14; patient readmitted to clinic last week. She has venous insufficiency and chronic secondary lymphedema. She has a nonhealing wound on the right lateral lower leg. She has been using gent mupirocin and Hydrofera Blue using an Urgo K2 lite wrap. [name], [name] ([id number]) [id number] Page 4 of 11 Her ABI on the right leg was 0.74 on admission to clinic. Formal arterial studies were ordered but we have not yet had any contact with the patient. Also there was some question about juxta lites for this patient we have not heard anything about that either. 8/28; patient presents for follow-up. She had ABIs completed on 8/23 that showed noncompressible ABIs on the right and left with TBI of 0.88 on the left and 0.96 on the right. She had triphasic waveforms throughout the arterial system. This is reassuring that she has adequate blood flow for healing. We have been using antibiotic ointment with Hydrofera Blue under Urgo K2 lite wrap. She has tolerated this well. Wound is smaller. 9/4; patient presents for follow-up. We have been using Urgo 2 regular4-layer equivalent as well as Hydrofera Blue and antibiotic ointment. Wound is slightly smaller. She has no issues or complaints. She tolerated the wrap well. 9/11; patient presents for follow-up. We have been using Hydrofera Blue with antibiotic ointment under Urgo regular compression. She states the wrap slid down on Sunday and she has been keeping the area covered for the past 2 to 3  days. Electronic Signature(s) Signed: 08/01/2023 1:14:22 PM By: Geralyn Corwin DO Entered By: Geralyn Corwin on 08/01/2023 09:12:54 -------------------------------------------------------------------------------- Physical Exam Details Patient Name: Date of Service: [name], Kayslee [name]. 08/01/2023 8:30 A M Medical Record Number: [id number] Patient Account Number: [id number] Date of Birth/Sex: Treating RN: 1981/01/14 (42 y.o. [name] Primary Care Provider: PA Zenovia Jordan, NO Other Clinician: Referring Provider: Treating Provider/Extender: Geralyn Corwin Self, Referral Weeks in Treatment: 5 Constitutional . Cardiovascular . Psychiatric . Notes T the right lower extremity on the lateral aspect there is an open wound with granulation tissue and nonviable tissue. Poor edema control. Venous stasis o dermatitis. No signs of surrounding infection including increased warmth, erythema or purulent drainage. Electronic Signature(s) Signed: 08/01/2023 1:14:22 PM By: Geralyn Corwin DO Entered By: Geralyn Corwin on 08/01/2023 09:13:17 -------------------------------------------------------------------------------- Physician Orders Details Patient Name: Date of Service: [name], Ambert [name]. 08/01/2023 8:30 A M Medical Record Number: [id number] Patient Account Number: [id number] Date of Birth/Sex: Treating RN: 1981/07/23 (42 y.o. Aaruhi, Flitton, Kaysee [name] ([id number]) [id number]  [name], [name] ([id number]) [id number] Page 1 of 11 Visit Report for 08/01/2023 Chief Complaint Document Details Patient Name: Date of Service: [name], [name] 08/01/2023 8:30 A M Medical Record Number: [id number] Patient Account Number: [id number] Date of Birth/Sex: Treating RN: December 13, 1980 (42 y.o. [name] Primary Care Provider: PA Zenovia Jordan, [place] Other Clinician: Referring Provider: Treating Provider/Extender: Geralyn Corwin Self, Referral Weeks in Treatment: 5 Information Obtained from: Patient Chief Complaint 06/27/2023; right lower extremity wound Electronic Signature(s) Signed: 08/01/2023 1:14:22 PM By: Geralyn Corwin DO Entered By: Geralyn Corwin on 08/01/2023 09:12:11 -------------------------------------------------------------------------------- Debridement Details Patient Name: Date of Service: [name], [name] 08/01/2023 8:30 A M Medical Record Number: [id number] Patient Account Number: [id number] Date of Birth/Sex: Treating RN: 07-15-1981 (42 y.o. [name] Primary Care Provider: PA Zenovia Jordan, NO Other Clinician: Referring Provider: Treating Provider/Extender: Geralyn Corwin Self, Referral Weeks in Treatment: 5 Debridement Performed for Assessment: Wound #7 Right,Lateral Lower Leg Performed By: Physician Geralyn Corwin, MD Debridement Type: Debridement Severity of Tissue Pre Debridement: Fat layer exposed Level of Consciousness (Pre-procedure): Awake and Alert Pre-procedure Verification/Time Out Yes - 09:08 Taken: Start Time: 09:08 Pain Control: Lidocaine 4% T opical Solution Percent of Wound Bed Debrided: 100% T Area Debrided (cm): otal 1.77 Tissue and other material debrided: Viable, Non-Viable, Slough, Slough Level: Non-Viable Tissue Debridement Description: Selective/Open Wound Instrument: Curette Bleeding: Minimum Hemostasis Achieved: Pressure Procedural Pain: 0 Post Procedural Pain: 0 [name], [name] ([id number])  [id number] Page 2 of 11 Response to Treatment: Procedure was tolerated well Level of Consciousness (Post- Awake and Alert procedure): Post Debridement Measurements of Total Wound Length: (cm) 1.5 Width: (cm) 1.5 Depth: (cm) 0.2 Volume: (cm) 0.353 Character of Wound/Ulcer Post Debridement: Stable Severity of Tissue Post Debridement: Fat layer exposed Post Procedure Diagnosis Same as Pre-procedure Electronic Signature(s) Signed: 08/01/2023 1:14:22 PM By: Geralyn Corwin DO Signed: 08/03/2023 12:41:22 PM By: Midge Aver MSN RN CNS WTA Entered By: Midge Aver on 08/01/2023 09:10:05 -------------------------------------------------------------------------------- HPI Details Patient Name: Date of Service: [name], [name] 08/01/2023 8:30 A M Medical Record Number: [id number] Patient Account Number: [id number] Date of Birth/Sex: Treating RN: 26-Feb-1981 (42 y.o. [name] Primary Care Provider: PA Zenovia Jordan, NO Other Clinician: Referring Provider: Treating Provider/Extender: Geralyn Corwin Self, Referral Weeks in Treatment: 5 History of Present Illness HPI Description: 42 year old patient well known to our [facility] wound care clinic where she has been seen since 2016 for bilateral lower extremity venous insufficiency disease with lymphedema and multiple ulcerations associated with morbid obesity. she had custom-made compression stockings and lymphedema pumps which were used in the past. most recently she was admitted to the [name] between October 11 and 09/02/2017 with sepsis, lower extremity wounds and lymphedema.she was initially treated in the outpatient with Keflex and Bactrim. she was initially treated in the [name] with vancomycin and Zosyn and changed over to Unasyn until her white count improved and her blood cultures were negative for 3 days. After her inpatient management she was discharged home on Augmentin to end on 09/13/2017 with a 14 day  course. she has had outpatient vascular duplex scans completed in November 2017 and her right ABI was 1.1 and the left ABI is 1.3. she had normal toe brachial indices bilaterally.she had three-vessel runoff in the right lower extremity and two-vessel runoff in the left lower extremity. On questioning the patient she does have custom made compression stockings and also has a lymphedema pump but has not been using it appropriately and has not been taking good care  [name], [name] ([id number]) [id number] Page 1 of 11 Visit Report for 08/01/2023 Chief Complaint Document Details Patient Name: Date of Service: [name], [name] 08/01/2023 8:30 A M Medical Record Number: [id number] Patient Account Number: [id number] Date of Birth/Sex: Treating RN: December 13, 1980 (42 y.o. [name] Primary Care Provider: PA Zenovia Jordan, [place] Other Clinician: Referring Provider: Treating Provider/Extender: Geralyn Corwin Self, Referral Weeks in Treatment: 5 Information Obtained from: Patient Chief Complaint 06/27/2023; right lower extremity wound Electronic Signature(s) Signed: 08/01/2023 1:14:22 PM By: Geralyn Corwin DO Entered By: Geralyn Corwin on 08/01/2023 09:12:11 -------------------------------------------------------------------------------- Debridement Details Patient Name: Date of Service: [name], [name] 08/01/2023 8:30 A M Medical Record Number: [id number] Patient Account Number: [id number] Date of Birth/Sex: Treating RN: 07-15-1981 (42 y.o. [name] Primary Care Provider: PA Zenovia Jordan, NO Other Clinician: Referring Provider: Treating Provider/Extender: Geralyn Corwin Self, Referral Weeks in Treatment: 5 Debridement Performed for Assessment: Wound #7 Right,Lateral Lower Leg Performed By: Physician Geralyn Corwin, MD Debridement Type: Debridement Severity of Tissue Pre Debridement: Fat layer exposed Level of Consciousness (Pre-procedure): Awake and Alert Pre-procedure Verification/Time Out Yes - 09:08 Taken: Start Time: 09:08 Pain Control: Lidocaine 4% T opical Solution Percent of Wound Bed Debrided: 100% T Area Debrided (cm): otal 1.77 Tissue and other material debrided: Viable, Non-Viable, Slough, Slough Level: Non-Viable Tissue Debridement Description: Selective/Open Wound Instrument: Curette Bleeding: Minimum Hemostasis Achieved: Pressure Procedural Pain: 0 Post Procedural Pain: 0 [name], [name] ([id number])  [id number] Page 2 of 11 Response to Treatment: Procedure was tolerated well Level of Consciousness (Post- Awake and Alert procedure): Post Debridement Measurements of Total Wound Length: (cm) 1.5 Width: (cm) 1.5 Depth: (cm) 0.2 Volume: (cm) 0.353 Character of Wound/Ulcer Post Debridement: Stable Severity of Tissue Post Debridement: Fat layer exposed Post Procedure Diagnosis Same as Pre-procedure Electronic Signature(s) Signed: 08/01/2023 1:14:22 PM By: Geralyn Corwin DO Signed: 08/03/2023 12:41:22 PM By: Midge Aver MSN RN CNS WTA Entered By: Midge Aver on 08/01/2023 09:10:05 -------------------------------------------------------------------------------- HPI Details Patient Name: Date of Service: [name], [name] 08/01/2023 8:30 A M Medical Record Number: [id number] Patient Account Number: [id number] Date of Birth/Sex: Treating RN: 26-Feb-1981 (42 y.o. [name] Primary Care Provider: PA Zenovia Jordan, NO Other Clinician: Referring Provider: Treating Provider/Extender: Geralyn Corwin Self, Referral Weeks in Treatment: 5 History of Present Illness HPI Description: 42 year old patient well known to our [facility] wound care clinic where she has been seen since 2016 for bilateral lower extremity venous insufficiency disease with lymphedema and multiple ulcerations associated with morbid obesity. she had custom-made compression stockings and lymphedema pumps which were used in the past. most recently she was admitted to the [name] between October 11 and 09/02/2017 with sepsis, lower extremity wounds and lymphedema.she was initially treated in the outpatient with Keflex and Bactrim. she was initially treated in the [name] with vancomycin and Zosyn and changed over to Unasyn until her white count improved and her blood cultures were negative for 3 days. After her inpatient management she was discharged home on Augmentin to end on 09/13/2017 with a 14 day  course. she has had outpatient vascular duplex scans completed in November 2017 and her right ABI was 1.1 and the left ABI is 1.3. she had normal toe brachial indices bilaterally.she had three-vessel runoff in the right lower extremity and two-vessel runoff in the left lower extremity. On questioning the patient she does have custom made compression stockings and also has a lymphedema pump but has not been using it appropriately and has not been taking good care  Juxta-Lite compression at that point. I think that would be an okay thing that we could do for her. Nonetheless she fortunately seems to be tolerating the wraps very well and again has made excellent progress with the [name] 06/11/18 on evaluation today patient appears to be doing rather well in regard to her bilateral lower extremities in fact everything appears to be completely healed at this point which is excellent news. There does not appear to be any evidence of infection at this time which is great. In fact overall I do believe she is completely resolved in regard to her bilateral  lower extremity ulcers. She is extremely happy in this regard. 06/27/2023 Ms. Alyasia Dubose is a 42 year old female with a past medical history of venous insufficiency and lymphedema that presents to the clinic for a 3-week history of nonhealing wound to the right lower extremity. She states this started spontaneously. She does not wear compression stockings. She has been keeping the area covered. She currently denies signs of infection. 8/14; patient readmitted to clinic last week. She has venous insufficiency and chronic secondary lymphedema. She has a nonhealing wound on the right lateral lower leg. She has been using gent mupirocin and Hydrofera Blue using an Urgo K2 lite wrap. [name], [name] ([id number]) [id number] Page 4 of 11 Her ABI on the right leg was 0.74 on admission to clinic. Formal arterial studies were ordered but we have not yet had any contact with the patient. Also there was some question about juxta lites for this patient we have not heard anything about that either. 8/28; patient presents for follow-up. She had ABIs completed on 8/23 that showed noncompressible ABIs on the right and left with TBI of 0.88 on the left and 0.96 on the right. She had triphasic waveforms throughout the arterial system. This is reassuring that she has adequate blood flow for healing. We have been using antibiotic ointment with Hydrofera Blue under Urgo K2 lite wrap. She has tolerated this well. Wound is smaller. 9/4; patient presents for follow-up. We have been using Urgo 2 regular4-layer equivalent as well as Hydrofera Blue and antibiotic ointment. Wound is slightly smaller. She has no issues or complaints. She tolerated the wrap well. 9/11; patient presents for follow-up. We have been using Hydrofera Blue with antibiotic ointment under Urgo regular compression. She states the wrap slid down on Sunday and she has been keeping the area covered for the past 2 to 3  days. Electronic Signature(s) Signed: 08/01/2023 1:14:22 PM By: Geralyn Corwin DO Entered By: Geralyn Corwin on 08/01/2023 09:12:54 -------------------------------------------------------------------------------- Physical Exam Details Patient Name: Date of Service: [name], Kayslee [name]. 08/01/2023 8:30 A M Medical Record Number: [id number] Patient Account Number: [id number] Date of Birth/Sex: Treating RN: 1981/01/14 (42 y.o. [name] Primary Care Provider: PA Zenovia Jordan, NO Other Clinician: Referring Provider: Treating Provider/Extender: Geralyn Corwin Self, Referral Weeks in Treatment: 5 Constitutional . Cardiovascular . Psychiatric . Notes T the right lower extremity on the lateral aspect there is an open wound with granulation tissue and nonviable tissue. Poor edema control. Venous stasis o dermatitis. No signs of surrounding infection including increased warmth, erythema or purulent drainage. Electronic Signature(s) Signed: 08/01/2023 1:14:22 PM By: Geralyn Corwin DO Entered By: Geralyn Corwin on 08/01/2023 09:13:17 -------------------------------------------------------------------------------- Physician Orders Details Patient Name: Date of Service: [name], Ambert [name]. 08/01/2023 8:30 A M Medical Record Number: [id number] Patient Account Number: [id number] Date of Birth/Sex: Treating RN: 1981/07/23 (42 y.o. Aaruhi, Flitton, Kaysee [name] ([id number]) [id number]  PM By: Geralyn Corwin DO Entered By: Geralyn Corwin on 08/01/2023 09:14:28 -------------------------------------------------------------------------------- ROS/PFSH Details Patient Name: Date of Service: [name], [name] 08/01/2023 8:30 A M Medical Record Number: [id number] Patient Account Number: [id number] Date of Birth/Sex: Treating RN: 03-04-1981 (42 y.o. [name] Primary Care Provider: PA Zenovia Jordan, NO Other Clinician: Referring Provider: Treating Provider/Extender: Geralyn Corwin Self, Referral Weeks in Treatment: 5 Information Obtained From Patient Hematologic/Lymphatic Medical History: Positive for: Lymphedema Respiratory Medical History: Negative for: Aspiration; Asthma; Chronic Obstructive Pulmonary Disease (COPD); Pneumothorax; Sleep Apnea; Tuberculosis Cardiovascular Medical HistorySHELIA, CRUMBAKER ([id number]) [id number] Page 10 of 11 Positive for: Hypertension Negative for: Angina; Arrhythmia; Congestive Heart Failure; Coronary Artery Disease; Deep Vein Thrombosis; Hypotension; Myocardial Infarction; Peripheral Arterial Disease; Peripheral Venous Disease; Phlebitis; Vasculitis Endocrine Medical History: Negative for: Type I Diabetes; Type II Diabetes Immunizations Pneumococcal Vaccine: Received Pneumococcal Vaccination: No Immunization Notes: up to date Implantable Devices None Family and Social History Cancer: No;  Diabetes: No; Heart Disease: No; Hereditary Spherocytosis: No; Hypertension: No; Kidney Disease: Yes - Mother; Lung Disease: No; Seizures: No; Stroke: No; Thyroid Problems: No; Tuberculosis: No; Never smoker; Marital Status - Widowed; Alcohol Use: Never; Drug Use: No History; Caffeine Use: Daily; Financial Concerns: No; Food, Clothing or Shelter Needs: No; Support System Lacking: No; Transportation Concerns: No Electronic Signature(s) Signed: 08/01/2023 1:14:22 PM By: Geralyn Corwin DO Signed: 08/03/2023 12:41:22 PM By: Midge Aver MSN RN CNS WTA Entered By: Geralyn Corwin on 08/01/2023 09:14:55 -------------------------------------------------------------------------------- SuperBill Details Patient Name: Date of Service: [name], Danya [name]. 08/01/2023 Medical Record Number: [id number] Patient Account Number: [id number] Date of Birth/Sex: Treating RN: October 16, 1981 (42 y.o. [name] Primary Care Provider: PA Zenovia Jordan, NO Other Clinician: Referring Provider: Treating Provider/Extender: Geralyn Corwin Self, Referral Weeks in Treatment: 5 Diagnosis Coding ICD-10 Codes Code Description [id number] Non-pressure chronic ulcer of other part of right lower leg with fat layer exposed I87.311 Chronic venous hypertension (idiopathic) with ulcer of right lower extremity I89.0 Lymphedema, not elsewhere classified Facility Procedures : CPT4 Code: 41324401 Description: 97597 - DEBRIDE WOUND 1ST 20 SQ CM OR < ICD-10 Diagnosis Description L97.812 Non-pressure chronic ulcer of other part of right lower leg with fat layer expos I87.311 Chronic venous hypertension (idiopathic) with ulcer of right lower  extremity I89.0 Lymphedema, not elsewhere classified Modifier: ed Quantity: 1 Physician Procedures : CPT4 Code Description Modifier [id number] 97597 - WC PHYS DEBR WO ANESTH 20 SQ CM ICD-10 Diagnosis Description CHIRSTY, BARTMESS [name] ([id number]) [id number] L97.812 Non-pressure chronic ulcer of  other part of right lower leg with fat layer  exposed I87.311 Chronic venous hypertension (idiopathic) with ulcer of right lower extremity I89.0 Lymphedema, not elsewhere classified Quantity: 1 17.pdf Page 11 of 11 Electronic Signature(s) Signed: 08/01/2023 1:14:22 PM By: Geralyn Corwin DO Entered By: Geralyn Corwin on 08/01/2023 09:14:37  Juxta-Lite compression at that point. I think that would be an okay thing that we could do for her. Nonetheless she fortunately seems to be tolerating the wraps very well and again has made excellent progress with the [name] 06/11/18 on evaluation today patient appears to be doing rather well in regard to her bilateral lower extremities in fact everything appears to be completely healed at this point which is excellent news. There does not appear to be any evidence of infection at this time which is great. In fact overall I do believe she is completely resolved in regard to her bilateral  lower extremity ulcers. She is extremely happy in this regard. 06/27/2023 Ms. Alyasia Dubose is a 42 year old female with a past medical history of venous insufficiency and lymphedema that presents to the clinic for a 3-week history of nonhealing wound to the right lower extremity. She states this started spontaneously. She does not wear compression stockings. She has been keeping the area covered. She currently denies signs of infection. 8/14; patient readmitted to clinic last week. She has venous insufficiency and chronic secondary lymphedema. She has a nonhealing wound on the right lateral lower leg. She has been using gent mupirocin and Hydrofera Blue using an Urgo K2 lite wrap. [name], [name] ([id number]) [id number] Page 4 of 11 Her ABI on the right leg was 0.74 on admission to clinic. Formal arterial studies were ordered but we have not yet had any contact with the patient. Also there was some question about juxta lites for this patient we have not heard anything about that either. 8/28; patient presents for follow-up. She had ABIs completed on 8/23 that showed noncompressible ABIs on the right and left with TBI of 0.88 on the left and 0.96 on the right. She had triphasic waveforms throughout the arterial system. This is reassuring that she has adequate blood flow for healing. We have been using antibiotic ointment with Hydrofera Blue under Urgo K2 lite wrap. She has tolerated this well. Wound is smaller. 9/4; patient presents for follow-up. We have been using Urgo 2 regular4-layer equivalent as well as Hydrofera Blue and antibiotic ointment. Wound is slightly smaller. She has no issues or complaints. She tolerated the wrap well. 9/11; patient presents for follow-up. We have been using Hydrofera Blue with antibiotic ointment under Urgo regular compression. She states the wrap slid down on Sunday and she has been keeping the area covered for the past 2 to 3  days. Electronic Signature(s) Signed: 08/01/2023 1:14:22 PM By: Geralyn Corwin DO Entered By: Geralyn Corwin on 08/01/2023 09:12:54 -------------------------------------------------------------------------------- Physical Exam Details Patient Name: Date of Service: [name], Kayslee [name]. 08/01/2023 8:30 A M Medical Record Number: [id number] Patient Account Number: [id number] Date of Birth/Sex: Treating RN: 1981/01/14 (42 y.o. [name] Primary Care Provider: PA Zenovia Jordan, NO Other Clinician: Referring Provider: Treating Provider/Extender: Geralyn Corwin Self, Referral Weeks in Treatment: 5 Constitutional . Cardiovascular . Psychiatric . Notes T the right lower extremity on the lateral aspect there is an open wound with granulation tissue and nonviable tissue. Poor edema control. Venous stasis o dermatitis. No signs of surrounding infection including increased warmth, erythema or purulent drainage. Electronic Signature(s) Signed: 08/01/2023 1:14:22 PM By: Geralyn Corwin DO Entered By: Geralyn Corwin on 08/01/2023 09:13:17 -------------------------------------------------------------------------------- Physician Orders Details Patient Name: Date of Service: [name], Ambert [name]. 08/01/2023 8:30 A M Medical Record Number: [id number] Patient Account Number: [id number] Date of Birth/Sex: Treating RN: 1981/07/23 (42 y.o. [place], [place], Kaysee [name] ([id number]) [id number]

## 2023-08-08 ENCOUNTER — Encounter: Payer: 59 | Admitting: Internal Medicine

## 2023-08-08 DIAGNOSIS — I87311 Chronic venous hypertension (idiopathic) with ulcer of right lower extremity: Secondary | ICD-10-CM | POA: Diagnosis not present

## 2023-08-09 NOTE — Progress Notes (Signed)
Instructions: Apply as directed by provider. JOAL, ZUPKO R ([id number]) [id number] Page 9 of 9 Prim Dressing: Hydrofera Blue Ready Transfer Foam, 2.5x2.5 (in/in) 2 x Per Week/30 Days ary Discharge Instructions: Apply Hydrofera Blue Ready to wound bed as directed Secondary Dressing: ABD Pad 5x9 (in/in) 2 x Per Week/30 Days Discharge Instructions: Cover with ABD pad Com pression Wrap: Urgo K2, two layer compression system, large 2 x Per Week/30 Days 1. I continued with the same dressing which is jet mupirocin Hydrofera Blue under and Urgo K2 regular compression 2. A relatively aggressive debridement today was necessary the surface of this was not viable the measurements not improving. 3. Unfortunately our edema control here is not optimal. At some point we will need to figure what she has at home I do not think these are compression pumps Electronic Signature(s) Signed: 08/08/2023 5:02:29 PM By: Baltazar Najjar MD Entered By: Baltazar Najjar on 08/08/2023 07:34:31 -------------------------------------------------------------------------------- SuperBill Details Patient Name: Date of Service: [name], [name] 08/08/2023 Medical Record Number: [id number] Patient Account Number: [id number] Date of Birth/Sex: Treating RN: [birth year] (42 y.o. 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 I87.311 Chronic venous hypertension (idiopathic) with ulcer of right lower extremity I89.0  Lymphedema, not elsewhere classified Modifier: osed Quantity: 1 Physician Procedures : CPT4 Code Description Modifier [id number] 11042 - WC PHYS SUBQ TISS 20 SQ CM ICD-10 Diagnosis Description L97.812 Non-pressure chronic ulcer of other part of right lower leg with fat layer exposed I87.311 Chronic venous hypertension (idiopathic) with  ulcer of right lower extremity I89.0 Lymphedema, not elsewhere classified Quantity: 1 Electronic Signature(s) Signed: 08/08/2023 5:02:29 PM By: Baltazar Najjar MD Entered By: Baltazar Najjar on 08/08/2023 07:34:45  Instructions: Apply as directed by provider. JOAL, ZUPKO R ([id number]) [id number] Page 9 of 9 Prim Dressing: Hydrofera Blue Ready Transfer Foam, 2.5x2.5 (in/in) 2 x Per Week/30 Days ary Discharge Instructions: Apply Hydrofera Blue Ready to wound bed as directed Secondary Dressing: ABD Pad 5x9 (in/in) 2 x Per Week/30 Days Discharge Instructions: Cover with ABD pad Com pression Wrap: Urgo K2, two layer compression system, large 2 x Per Week/30 Days 1. I continued with the same dressing which is jet mupirocin Hydrofera Blue under and Urgo K2 regular compression 2. A relatively aggressive debridement today was necessary the surface of this was not viable the measurements not improving. 3. Unfortunately our edema control here is not optimal. At some point we will need to figure what she has at home I do not think these are compression pumps Electronic Signature(s) Signed: 08/08/2023 5:02:29 PM By: Baltazar Najjar MD Entered By: Baltazar Najjar on 08/08/2023 07:34:31 -------------------------------------------------------------------------------- SuperBill Details Patient Name: Date of Service: [name], [name] 08/08/2023 Medical Record Number: [id number] Patient Account Number: [id number] Date of Birth/Sex: Treating RN: [birth year] (42 y.o. 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 I87.311 Chronic venous hypertension (idiopathic) with ulcer of right lower extremity I89.0  Lymphedema, not elsewhere classified Modifier: osed Quantity: 1 Physician Procedures : CPT4 Code Description Modifier [id number] 11042 - WC PHYS SUBQ TISS 20 SQ CM ICD-10 Diagnosis Description L97.812 Non-pressure chronic ulcer of other part of right lower leg with fat layer exposed I87.311 Chronic venous hypertension (idiopathic) with  ulcer of right lower extremity I89.0 Lymphedema, not elsewhere classified Quantity: 1 Electronic Signature(s) Signed: 08/08/2023 5:02:29 PM By: Baltazar Najjar MD Entered By: Baltazar Najjar on 08/08/2023 07:34:45  Instructions: Apply as directed by provider. JOAL, ZUPKO R ([id number]) [id number] Page 9 of 9 Prim Dressing: Hydrofera Blue Ready Transfer Foam, 2.5x2.5 (in/in) 2 x Per Week/30 Days ary Discharge Instructions: Apply Hydrofera Blue Ready to wound bed as directed Secondary Dressing: ABD Pad 5x9 (in/in) 2 x Per Week/30 Days Discharge Instructions: Cover with ABD pad Com pression Wrap: Urgo K2, two layer compression system, large 2 x Per Week/30 Days 1. I continued with the same dressing which is jet mupirocin Hydrofera Blue under and Urgo K2 regular compression 2. A relatively aggressive debridement today was necessary the surface of this was not viable the measurements not improving. 3. Unfortunately our edema control here is not optimal. At some point we will need to figure what she has at home I do not think these are compression pumps Electronic Signature(s) Signed: 08/08/2023 5:02:29 PM By: Baltazar Najjar MD Entered By: Baltazar Najjar on 08/08/2023 07:34:31 -------------------------------------------------------------------------------- SuperBill Details Patient Name: Date of Service: [name], [name] 08/08/2023 Medical Record Number: [id number] Patient Account Number: [id number] Date of Birth/Sex: Treating RN: [birth year] (42 y.o. 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 I87.311 Chronic venous hypertension (idiopathic) with ulcer of right lower extremity I89.0  Lymphedema, not elsewhere classified Modifier: osed Quantity: 1 Physician Procedures : CPT4 Code Description Modifier [id number] 11042 - WC PHYS SUBQ TISS 20 SQ CM ICD-10 Diagnosis Description L97.812 Non-pressure chronic ulcer of other part of right lower leg with fat layer exposed I87.311 Chronic venous hypertension (idiopathic) with  ulcer of right lower extremity I89.0 Lymphedema, not elsewhere classified Quantity: 1 Electronic Signature(s) Signed: 08/08/2023 5:02:29 PM By: Baltazar Najjar MD Entered By: Baltazar Najjar on 08/08/2023 07:34:45  Instructions: Apply as directed by provider. JOAL, ZUPKO R ([id number]) [id number] Page 9 of 9 Prim Dressing: Hydrofera Blue Ready Transfer Foam, 2.5x2.5 (in/in) 2 x Per Week/30 Days ary Discharge Instructions: Apply Hydrofera Blue Ready to wound bed as directed Secondary Dressing: ABD Pad 5x9 (in/in) 2 x Per Week/30 Days Discharge Instructions: Cover with ABD pad Com pression Wrap: Urgo K2, two layer compression system, large 2 x Per Week/30 Days 1. I continued with the same dressing which is jet mupirocin Hydrofera Blue under and Urgo K2 regular compression 2. A relatively aggressive debridement today was necessary the surface of this was not viable the measurements not improving. 3. Unfortunately our edema control here is not optimal. At some point we will need to figure what she has at home I do not think these are compression pumps Electronic Signature(s) Signed: 08/08/2023 5:02:29 PM By: Baltazar Najjar MD Entered By: Baltazar Najjar on 08/08/2023 07:34:31 -------------------------------------------------------------------------------- SuperBill Details Patient Name: Date of Service: [name], [name] 08/08/2023 Medical Record Number: [id number] Patient Account Number: [id number] Date of Birth/Sex: Treating RN: [birth year] (42 y.o. 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 I87.311 Chronic venous hypertension (idiopathic) with ulcer of right lower extremity I89.0  Lymphedema, not elsewhere classified Modifier: osed Quantity: 1 Physician Procedures : CPT4 Code Description Modifier [id number] 11042 - WC PHYS SUBQ TISS 20 SQ CM ICD-10 Diagnosis Description L97.812 Non-pressure chronic ulcer of other part of right lower leg with fat layer exposed I87.311 Chronic venous hypertension (idiopathic) with  ulcer of right lower extremity I89.0 Lymphedema, not elsewhere classified Quantity: 1 Electronic Signature(s) Signed: 08/08/2023 5:02:29 PM By: Baltazar Najjar MD Entered By: Baltazar Najjar on 08/08/2023 07:34:45  [name], Jessica R ([id number]) [id number] Page 1 of 9 Visit Report for 08/08/2023 Debridement Details Patient Name: Date of Service: [name], ZOLLICOFFER. 08/08/2023 9:30 A M Medical Record Number: [id number] Patient Account Number: [id number] Date of Birth/Sex: Treating RN: 11/09/1981 (42 y.o. [name] Primary Care Provider: PA TIENT, NO Other Clinician: Referring Provider: Treating Provider/Extender: [name], [name], Referral Weeks in Treatment: 6 Debridement Performed for Assessment: Wound #7 Right,Lateral Lower Leg Performed By: Physician Maxwell Caul, MD Debridement Type: Debridement Severity of Tissue Pre Debridement: Fat layer exposed Level of Consciousness (Pre-procedure): Awake and Alert Pre-procedure Verification/Time Out Yes - 10:19 Taken: Start Time: 10:19 Pain Control: Lidocaine 4% T opical Solution Percent of Wound Bed Debrided: 100% T Area Debrided (cm): otal 1.77 Tissue and other material debrided: Viable, Non-Viable, Slough, Subcutaneous, Slough Level: Skin/Subcutaneous Tissue Debridement Description: Excisional Instrument: Curette Bleeding: Minimum Hemostasis Achieved: Pressure Procedural Pain: 0 Post Procedural Pain: 0 Response to Treatment: Procedure was tolerated well Level of Consciousness (Post- Awake and Alert procedure): Post Debridement Measurements of Total Wound Length: (cm) 1.5 Width: (cm) 1.5 Depth: (cm) 0.2 Volume: (cm) 0.353 Character of Wound/Ulcer Post Debridement: Stable Severity of Tissue Post Debridement: Fat layer exposed Post Procedure Diagnosis Same as Pre-procedure Electronic Signature(s) Signed: 08/08/2023 5:02:29 PM By: Baltazar Najjar MD Signed: 08/08/2023 5:31:20 PM By: Midge Aver MSN RN CNS WTA Entered By: Midge Aver on 08/08/2023 07:19:59 Wender, Marijean R ([id number]) [id number] Page 2 of  9 -------------------------------------------------------------------------------- HPI Details Patient Name: [name] 08/08/2023 9:30 A M Medical Record Number: [id number] Patient Account Number: [id number] Date of Birth/Sex: Treating RN: [birth year] (42 y.o. [name] Primary Care Provider: PA TIENT, NO Other Clinician: Referring Provider: Treating Provider/Extender: [name], [name], Referral Weeks in Treatment: 6 History of Present Illness HPI Description: 42 year old patient well known to our [name] where she has been seen since 2016 for bilateral lower extremity venous insufficiency disease with lymphedema and multiple ulcerations associated with morbid obesity. she had custom-made compression stockings and lymphedema pumps which were used in the past. most recently she was admitted to the hospital between October 11 and 09/02/2017 with sepsis, lower extremity wounds and lymphedema.she was initially treated in the outpatient with Keflex and Bactrim. she was initially treated in the hospital with vancomycin and Zosyn and changed over to Unasyn until her white count improved and her blood cultures were negative for 3 days. After her inpatient management she was discharged home on Augmentin to end on 09/13/2017 with a 14 day course. she has had outpatient vascular duplex scans completed in November 2017 and her right ABI was 1.1 and the left ABI is 1.3. she had normal toe brachial indices bilaterally.she had three-vessel runoff in the right lower extremity and two-vessel runoff in the left lower extremity. On questioning the patient she does have custom made compression stockings and also has a lymphedema pump but has not been using it appropriately and has not been taking good care of herself. 09/17/2017 -- she returns today with compression stockings on the left side and the right side has had significant amount of drainage and  has a very strong odor 09/24/2017 -- the drainage is increased significantly and she has more lymphedema and a very strong odor to her wound. Though she does not have systemic symptoms, or overt infection I believe she will benefit from some doxycycline given empirically. 10/01/2017 -- after starting the doxycycline  [name], Jessica R ([id number]) [id number] Page 1 of 9 Visit Report for 08/08/2023 Debridement Details Patient Name: Date of Service: [name], ZOLLICOFFER. 08/08/2023 9:30 A M Medical Record Number: [id number] Patient Account Number: [id number] Date of Birth/Sex: Treating RN: 11/09/1981 (42 y.o. [name] Primary Care Provider: PA TIENT, NO Other Clinician: Referring Provider: Treating Provider/Extender: [name], [name], Referral Weeks in Treatment: 6 Debridement Performed for Assessment: Wound #7 Right,Lateral Lower Leg Performed By: Physician Maxwell Caul, MD Debridement Type: Debridement Severity of Tissue Pre Debridement: Fat layer exposed Level of Consciousness (Pre-procedure): Awake and Alert Pre-procedure Verification/Time Out Yes - 10:19 Taken: Start Time: 10:19 Pain Control: Lidocaine 4% T opical Solution Percent of Wound Bed Debrided: 100% T Area Debrided (cm): otal 1.77 Tissue and other material debrided: Viable, Non-Viable, Slough, Subcutaneous, Slough Level: Skin/Subcutaneous Tissue Debridement Description: Excisional Instrument: Curette Bleeding: Minimum Hemostasis Achieved: Pressure Procedural Pain: 0 Post Procedural Pain: 0 Response to Treatment: Procedure was tolerated well Level of Consciousness (Post- Awake and Alert procedure): Post Debridement Measurements of Total Wound Length: (cm) 1.5 Width: (cm) 1.5 Depth: (cm) 0.2 Volume: (cm) 0.353 Character of Wound/Ulcer Post Debridement: Stable Severity of Tissue Post Debridement: Fat layer exposed Post Procedure Diagnosis Same as Pre-procedure Electronic Signature(s) Signed: 08/08/2023 5:02:29 PM By: Baltazar Najjar MD Signed: 08/08/2023 5:31:20 PM By: Midge Aver MSN RN CNS WTA Entered By: Midge Aver on 08/08/2023 07:19:59 Wender, Marijean R ([id number]) [id number] Page 2 of  9 -------------------------------------------------------------------------------- HPI Details Patient Name: [name] 08/08/2023 9:30 A M Medical Record Number: [id number] Patient Account Number: [id number] Date of Birth/Sex: Treating RN: [birth year] (42 y.o. [name] Primary Care Provider: PA TIENT, NO Other Clinician: Referring Provider: Treating Provider/Extender: [name], [name], Referral Weeks in Treatment: 6 History of Present Illness HPI Description: 42 year old patient well known to our [name] care clinic where she has been seen since 2016 for bilateral lower extremity venous insufficiency disease with lymphedema and multiple ulcerations associated with morbid obesity. she had custom-made compression stockings and lymphedema pumps which were used in the past. most recently she was admitted to the hospital between October 11 and 09/02/2017 with sepsis, lower extremity wounds and lymphedema.she was initially treated in the outpatient with Keflex and Bactrim. she was initially treated in the hospital with vancomycin and Zosyn and changed over to Unasyn until her white count improved and her blood cultures were negative for 3 days. After her inpatient management she was discharged home on Augmentin to end on 09/13/2017 with a 14 day course. she has had outpatient vascular duplex scans completed in November 2017 and her right ABI was 1.1 and the left ABI is 1.3. she had normal toe brachial indices bilaterally.she had three-vessel runoff in the right lower extremity and two-vessel runoff in the left lower extremity. On questioning the patient she does have custom made compression stockings and also has a lymphedema pump but has not been using it appropriately and has not been taking good care of herself. 09/17/2017 -- she returns today with compression stockings on the left side and the right side has had significant amount of drainage and  has a very strong odor 09/24/2017 -- the drainage is increased significantly and she has more lymphedema and a very strong odor to her wound. Though she does not have systemic symptoms, or overt infection I believe she will benefit from some doxycycline given empirically. 10/01/2017 -- after starting the doxycycline  Instructions: Apply as directed by provider. JOAL, ZUPKO R ([id number]) [id number] Page 9 of 9 Prim Dressing: Hydrofera Blue Ready Transfer Foam, 2.5x2.5 (in/in) 2 x Per Week/30 Days ary Discharge Instructions: Apply Hydrofera Blue Ready to wound bed as directed Secondary Dressing: ABD Pad 5x9 (in/in) 2 x Per Week/30 Days Discharge Instructions: Cover with ABD pad Com pression Wrap: Urgo K2, two layer compression system, large 2 x Per Week/30 Days 1. I continued with the same dressing which is jet mupirocin Hydrofera Blue under and Urgo K2 regular compression 2. A relatively aggressive debridement today was necessary the surface of this was not viable the measurements not improving. 3. Unfortunately our edema control here is not optimal. At some point we will need to figure what she has at home I do not think these are compression pumps Electronic Signature(s) Signed: 08/08/2023 5:02:29 PM By: Baltazar Najjar MD Entered By: Baltazar Najjar on 08/08/2023 07:34:31 -------------------------------------------------------------------------------- SuperBill Details Patient Name: Date of Service: [name], [name] 08/08/2023 Medical Record Number: [id number] Patient Account Number: [id number] Date of Birth/Sex: Treating RN: [birth year] (42 y.o. 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 I87.311 Chronic venous hypertension (idiopathic) with ulcer of right lower extremity I89.0  Lymphedema, not elsewhere classified Modifier: osed Quantity: 1 Physician Procedures : CPT4 Code Description Modifier [id number] 11042 - WC PHYS SUBQ TISS 20 SQ CM ICD-10 Diagnosis Description L97.812 Non-pressure chronic ulcer of other part of right lower leg with fat layer exposed I87.311 Chronic venous hypertension (idiopathic) with  ulcer of right lower extremity I89.0 Lymphedema, not elsewhere classified Quantity: 1 Electronic Signature(s) Signed: 08/08/2023 5:02:29 PM By: Baltazar Najjar MD Entered By: Baltazar Najjar on 08/08/2023 07:34:45  Instructions: Apply as directed by provider. JOAL, ZUPKO R ([id number]) [id number] Page 9 of 9 Prim Dressing: Hydrofera Blue Ready Transfer Foam, 2.5x2.5 (in/in) 2 x Per Week/30 Days ary Discharge Instructions: Apply Hydrofera Blue Ready to wound bed as directed Secondary Dressing: ABD Pad 5x9 (in/in) 2 x Per Week/30 Days Discharge Instructions: Cover with ABD pad Com pression Wrap: Urgo K2, two layer compression system, large 2 x Per Week/30 Days 1. I continued with the same dressing which is jet mupirocin Hydrofera Blue under and Urgo K2 regular compression 2. A relatively aggressive debridement today was necessary the surface of this was not viable the measurements not improving. 3. Unfortunately our edema control here is not optimal. At some point we will need to figure what she has at home I do not think these are compression pumps Electronic Signature(s) Signed: 08/08/2023 5:02:29 PM By: Baltazar Najjar MD Entered By: Baltazar Najjar on 08/08/2023 07:34:31 -------------------------------------------------------------------------------- SuperBill Details Patient Name: Date of Service: [name], [name] 08/08/2023 Medical Record Number: [id number] Patient Account Number: [id number] Date of Birth/Sex: Treating RN: [birth year] (42 y.o. 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 I87.311 Chronic venous hypertension (idiopathic) with ulcer of right lower extremity I89.0  Lymphedema, not elsewhere classified Modifier: osed Quantity: 1 Physician Procedures : CPT4 Code Description Modifier [id number] 11042 - WC PHYS SUBQ TISS 20 SQ CM ICD-10 Diagnosis Description L97.812 Non-pressure chronic ulcer of other part of right lower leg with fat layer exposed I87.311 Chronic venous hypertension (idiopathic) with  ulcer of right lower extremity I89.0 Lymphedema, not elsewhere classified Quantity: 1 Electronic Signature(s) Signed: 08/08/2023 5:02:29 PM By: Baltazar Najjar MD Entered By: Baltazar Najjar on 08/08/2023 07:34:45  Instructions: Apply as directed by provider. JOAL, ZUPKO R ([id number]) [id number] Page 9 of 9 Prim Dressing: Hydrofera Blue Ready Transfer Foam, 2.5x2.5 (in/in) 2 x Per Week/30 Days ary Discharge Instructions: Apply Hydrofera Blue Ready to wound bed as directed Secondary Dressing: ABD Pad 5x9 (in/in) 2 x Per Week/30 Days Discharge Instructions: Cover with ABD pad Com pression Wrap: Urgo K2, two layer compression system, large 2 x Per Week/30 Days 1. I continued with the same dressing which is jet mupirocin Hydrofera Blue under and Urgo K2 regular compression 2. A relatively aggressive debridement today was necessary the surface of this was not viable the measurements not improving. 3. Unfortunately our edema control here is not optimal. At some point we will need to figure what she has at home I do not think these are compression pumps Electronic Signature(s) Signed: 08/08/2023 5:02:29 PM By: Baltazar Najjar MD Entered By: Baltazar Najjar on 08/08/2023 07:34:31 -------------------------------------------------------------------------------- SuperBill Details Patient Name: Date of Service: [name], [name] 08/08/2023 Medical Record Number: [id number] Patient Account Number: [id number] Date of Birth/Sex: Treating RN: [birth year] (42 y.o. 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 I87.311 Chronic venous hypertension (idiopathic) with ulcer of right lower extremity I89.0  Lymphedema, not elsewhere classified Modifier: osed Quantity: 1 Physician Procedures : CPT4 Code Description Modifier [id number] 11042 - WC PHYS SUBQ TISS 20 SQ CM ICD-10 Diagnosis Description L97.812 Non-pressure chronic ulcer of other part of right lower leg with fat layer exposed I87.311 Chronic venous hypertension (idiopathic) with  ulcer of right lower extremity I89.0 Lymphedema, not elsewhere classified Quantity: 1 Electronic Signature(s) Signed: 08/08/2023 5:02:29 PM By: Baltazar Najjar MD Entered By: Baltazar Najjar on 08/08/2023 07:34:45  [name], Jessica R ([id number]) [id number] Page 1 of 9 Visit Report for 08/08/2023 Debridement Details Patient Name: Date of Service: [name], ZOLLICOFFER. 08/08/2023 9:30 A M Medical Record Number: [id number] Patient Account Number: [id number] Date of Birth/Sex: Treating RN: 11/09/1981 (42 y.o. [name] Primary Care Provider: PA TIENT, NO Other Clinician: Referring Provider: Treating Provider/Extender: [name], [name], Referral Weeks in Treatment: 6 Debridement Performed for Assessment: Wound #7 Right,Lateral Lower Leg Performed By: Physician Maxwell Caul, MD Debridement Type: Debridement Severity of Tissue Pre Debridement: Fat layer exposed Level of Consciousness (Pre-procedure): Awake and Alert Pre-procedure Verification/Time Out Yes - 10:19 Taken: Start Time: 10:19 Pain Control: Lidocaine 4% T opical Solution Percent of Wound Bed Debrided: 100% T Area Debrided (cm): otal 1.77 Tissue and other material debrided: Viable, Non-Viable, Slough, Subcutaneous, Slough Level: Skin/Subcutaneous Tissue Debridement Description: Excisional Instrument: Curette Bleeding: Minimum Hemostasis Achieved: Pressure Procedural Pain: 0 Post Procedural Pain: 0 Response to Treatment: Procedure was tolerated well Level of Consciousness (Post- Awake and Alert procedure): Post Debridement Measurements of Total Wound Length: (cm) 1.5 Width: (cm) 1.5 Depth: (cm) 0.2 Volume: (cm) 0.353 Character of Wound/Ulcer Post Debridement: Stable Severity of Tissue Post Debridement: Fat layer exposed Post Procedure Diagnosis Same as Pre-procedure Electronic Signature(s) Signed: 08/08/2023 5:02:29 PM By: Baltazar Najjar MD Signed: 08/08/2023 5:31:20 PM By: Midge Aver MSN RN CNS WTA Entered By: Midge Aver on 08/08/2023 07:19:59 Wender, Marijean R ([id number]) [id number] Page 2 of  9 -------------------------------------------------------------------------------- HPI Details Patient Name: [name] 08/08/2023 9:30 A M Medical Record Number: [id number] Patient Account Number: [id number] Date of Birth/Sex: Treating RN: [birth year] (42 y.o. [name] Primary Care Provider: PA TIENT, NO Other Clinician: Referring Provider: Treating Provider/Extender: [name], [name], Referral Weeks in Treatment: 6 History of Present Illness HPI Description: 42 year old patient well known to our [name] where she has been seen since 2016 for bilateral lower extremity venous insufficiency disease with lymphedema and multiple ulcerations associated with morbid obesity. she had custom-made compression stockings and lymphedema pumps which were used in the past. most recently she was admitted to the hospital between October 11 and 09/02/2017 with sepsis, lower extremity wounds and lymphedema.she was initially treated in the outpatient with Keflex and Bactrim. she was initially treated in the hospital with vancomycin and Zosyn and changed over to Unasyn until her white count improved and her blood cultures were negative for 3 days. After her inpatient management she was discharged home on Augmentin to end on 09/13/2017 with a 14 day course. she has had outpatient vascular duplex scans completed in November 2017 and her right ABI was 1.1 and the left ABI is 1.3. she had normal toe brachial indices bilaterally.she had three-vessel runoff in the right lower extremity and two-vessel runoff in the left lower extremity. On questioning the patient she does have custom made compression stockings and also has a lymphedema pump but has not been using it appropriately and has not been taking good care of herself. 09/17/2017 -- she returns today with compression stockings on the left side and the right side has had significant amount of drainage and  has a very strong odor 09/24/2017 -- the drainage is increased significantly and she has more lymphedema and a very strong odor to her wound. Though she does not have systemic symptoms, or overt infection I believe she will benefit from some doxycycline given empirically. 10/01/2017 -- after starting the doxycycline  Instructions: Apply as directed by provider. JOAL, ZUPKO R ([id number]) [id number] Page 9 of 9 Prim Dressing: Hydrofera Blue Ready Transfer Foam, 2.5x2.5 (in/in) 2 x Per Week/30 Days ary Discharge Instructions: Apply Hydrofera Blue Ready to wound bed as directed Secondary Dressing: ABD Pad 5x9 (in/in) 2 x Per Week/30 Days Discharge Instructions: Cover with ABD pad Com pression Wrap: Urgo K2, two layer compression system, large 2 x Per Week/30 Days 1. I continued with the same dressing which is jet mupirocin Hydrofera Blue under and Urgo K2 regular compression 2. A relatively aggressive debridement today was necessary the surface of this was not viable the measurements not improving. 3. Unfortunately our edema control here is not optimal. At some point we will need to figure what she has at home I do not think these are compression pumps Electronic Signature(s) Signed: 08/08/2023 5:02:29 PM By: Baltazar Najjar MD Entered By: Baltazar Najjar on 08/08/2023 07:34:31 -------------------------------------------------------------------------------- SuperBill Details Patient Name: Date of Service: [name], [name] 08/08/2023 Medical Record Number: [id number] Patient Account Number: [id number] Date of Birth/Sex: Treating RN: [birth year] (42 y.o. 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 I87.311 Chronic venous hypertension (idiopathic) with ulcer of right lower extremity I89.0  Lymphedema, not elsewhere classified Modifier: osed Quantity: 1 Physician Procedures : CPT4 Code Description Modifier [id number] 11042 - WC PHYS SUBQ TISS 20 SQ CM ICD-10 Diagnosis Description L97.812 Non-pressure chronic ulcer of other part of right lower leg with fat layer exposed I87.311 Chronic venous hypertension (idiopathic) with  ulcer of right lower extremity I89.0 Lymphedema, not elsewhere classified Quantity: 1 Electronic Signature(s) Signed: 08/08/2023 5:02:29 PM By: Baltazar Najjar MD Entered By: Baltazar Najjar on 08/08/2023 07:34:45  [name], Jessica R ([id number]) [id number] Page 1 of 9 Visit Report for 08/08/2023 Debridement Details Patient Name: Date of Service: [name], ZOLLICOFFER. 08/08/2023 9:30 A M Medical Record Number: [id number] Patient Account Number: [id number] Date of Birth/Sex: Treating RN: 11/09/1981 (42 y.o. [name] Primary Care Provider: PA TIENT, NO Other Clinician: Referring Provider: Treating Provider/Extender: [name], [name], Referral Weeks in Treatment: 6 Debridement Performed for Assessment: Wound #7 Right,Lateral Lower Leg Performed By: Physician Maxwell Caul, MD Debridement Type: Debridement Severity of Tissue Pre Debridement: Fat layer exposed Level of Consciousness (Pre-procedure): Awake and Alert Pre-procedure Verification/Time Out Yes - 10:19 Taken: Start Time: 10:19 Pain Control: Lidocaine 4% T opical Solution Percent of Wound Bed Debrided: 100% T Area Debrided (cm): otal 1.77 Tissue and other material debrided: Viable, Non-Viable, Slough, Subcutaneous, Slough Level: Skin/Subcutaneous Tissue Debridement Description: Excisional Instrument: Curette Bleeding: Minimum Hemostasis Achieved: Pressure Procedural Pain: 0 Post Procedural Pain: 0 Response to Treatment: Procedure was tolerated well Level of Consciousness (Post- Awake and Alert procedure): Post Debridement Measurements of Total Wound Length: (cm) 1.5 Width: (cm) 1.5 Depth: (cm) 0.2 Volume: (cm) 0.353 Character of Wound/Ulcer Post Debridement: Stable Severity of Tissue Post Debridement: Fat layer exposed Post Procedure Diagnosis Same as Pre-procedure Electronic Signature(s) Signed: 08/08/2023 5:02:29 PM By: Baltazar Najjar MD Signed: 08/08/2023 5:31:20 PM By: Midge Aver MSN RN CNS WTA Entered By: Midge Aver on 08/08/2023 07:19:59 Wender, Marijean R ([id number]) [id number] Page 2 of  9 -------------------------------------------------------------------------------- HPI Details Patient Name: [name] [name] 08/08/2023 9:30 A M Medical Record Number: [id number] Patient Account Number: [id number] Date of Birth/Sex: Treating RN: [birth year] (42 y.o. [name] Primary Care Provider: PA TIENT, NO Other Clinician: Referring Provider: Treating Provider/Extender: [name], [name], Referral Weeks in Treatment: 6 History of Present Illness HPI Description: 42 year old patient well known to our [name] where she has been seen since 2016 for bilateral lower extremity venous insufficiency disease with lymphedema and multiple ulcerations associated with morbid obesity. she had custom-made compression stockings and lymphedema pumps which were used in the past. most recently she was admitted to the hospital between October 11 and 09/02/2017 with sepsis, lower extremity wounds and lymphedema.she was initially treated in the outpatient with Keflex and Bactrim. she was initially treated in the hospital with vancomycin and Zosyn and changed over to Unasyn until her white count improved and her blood cultures were negative for 3 days. After her inpatient management she was discharged home on Augmentin to end on 09/13/2017 with a 14 day course. she has had outpatient vascular duplex scans completed in November 2017 and her right ABI was 1.1 and the left ABI is 1.3. she had normal toe brachial indices bilaterally.she had three-vessel runoff in the right lower extremity and two-vessel runoff in the left lower extremity. On questioning the patient she does have custom made compression stockings and also has a lymphedema pump but has not been using it appropriately and has not been taking good care of herself. 09/17/2017 -- she returns today with compression stockings on the left side and the right side has had significant amount of drainage and  has a very strong odor 09/24/2017 -- the drainage is increased significantly and she has more lymphedema and a very strong odor to her wound. Though she does not have systemic symptoms, or overt infection I believe she will benefit from some doxycycline given empirically. 10/01/2017 -- after starting the doxycycline

## 2023-08-09 NOTE — Progress Notes (Signed)
Jessica, KALLIN [name] ([id number]) [id number] Page 1 of 9 Visit Report for 08/08/2023 Arrival Information Details Patient Name: Date of Service: [name], [name] 08/08/2023 9:30 A M Medical Record Number: [id number] Patient Account Number: [id number] Date of Birth/Sex: Treating RN: 07/22/1981 (42 y.o. [name] Primary Care [name]: PA Zenovia Jordan, NO Other Clinician: Referring [name]: Treating [name]/Extender: [name], [name], Referral Weeks in Treatment: 6 Visit Information History Since Last Visit Added or deleted any medications: No Patient Arrived: Ambulatory Any new allergies or adverse reactions: No Arrival Time: 09:55 Has Dressing in Place as Prescribed: Yes Accompanied By: self Has Compression in Place as Prescribed: Yes Transfer Assistance: None Pain Present Now: No Patient Identification Verified: Yes Secondary Verification Process Completed: Yes Patient Requires Transmission-Based Precautions: No Patient Has Alerts: No Electronic Signature(s) Signed: 08/08/2023 5:31:20 PM By: Midge Aver MSN RN CNS WTA Entered By: Midge Aver on 08/08/2023 06:58:14 -------------------------------------------------------------------------------- Clinic Level of Care Assessment Details Patient Name: Date of Service: [name], [name] 08/08/2023 9:30 A M Medical Record Number: [id number] Patient Account Number: [id number] Date of Birth/Sex: Treating RN: August 19, 1981 (42 y.o. [name] Primary Care [name]: PA Zenovia Jordan, NO Other Clinician: Referring [name]: Treating [name]/Extender: [name], [name], Referral Weeks in Treatment: 6 Clinic Level of Care Assessment Items TOOL 1 Quantity Score []  - 0 Use when EandM and Procedure is performed on INITIAL visit ASSESSMENTS - Nursing Assessment / Reassessment []  - 0 General Physical Exam (combine w/ comprehensive assessment (listed just below) when performed on new pt. evals) []  -  0 Comprehensive Assessment (HX, ROS, Risk Assessments, Wounds Hx, etc.) ASSESSMENTS - Wound and Skin Assessment / Reassessment []  - 0 Dermatologic / Skin Assessment (not related to wound area) ASSESSMENTS - Ostomy and/or Continence Assessment and Care Jessica, [name] ([id number]) [id number] Page 2 of 9 []  - 0 Incontinence Assessment and Management []  - 0 Ostomy Care Assessment and Management (repouching, etc.) PROCESS - Coordination of Care []  - 0 Simple Patient / Family Education for ongoing care []  - 0 Complex (extensive) Patient / Family Education for ongoing care []  - 0 Staff obtains Chiropractor, Records, T Results / Process Orders est []  - 0 Staff telephones HHA, Nursing Homes / Clarify orders / etc []  - 0 Routine Transfer to another Facility (non-emergent condition) []  - 0 Routine [name] Admission (non-emergent condition) []  - 0 New Admissions / Manufacturing engineer / Ordering NPWT Apligraf, etc. , []  - 0 Emergency [name] Admission (emergent condition) PROCESS - Special Needs []  - 0 Pediatric / Minor Patient Management []  - 0 Isolation Patient Management []  - 0 Hearing / Language / Visual special needs []  - 0 Assessment of Community assistance (transportation, D/C planning, etc.) []  - 0 Additional assistance / Altered mentation []  - 0 Support Surface(s) Assessment (bed, cushion, seat, etc.) INTERVENTIONS - Miscellaneous []  - 0 External ear exam []  - 0 Patient Transfer (multiple staff / Nurse, adult / Similar devices) []  - 0 Simple Staple / Suture removal (25 or less) []  - 0 Complex Staple / Suture removal (26 or more) []  - 0 Hypo/Hyperglycemic Management (do not check if billed separately) []  - 0 Ankle / Brachial Index (ABI) - do not check if billed separately Has the patient been seen at the [name] within the last three years: Yes Total Score: 0 Level Of Care: ____ Electronic Signature(s) Signed: 08/08/2023 5:31:20 PM By:  Midge Aver MSN RN CNS WTA Entered By: Midge Aver on 08/08/2023 07:20:44 --------------------------------------------------------------------------------  Compression Therapy Details Patient Name: Date of Service: [name], [name] 08/08/2023 9:30 A M Medical Record Number: [id number] Patient Account Number: [id number] Date of Birth/Sex: Treating RN: 06/30/1981 (42 y.o. [name] Primary Care [name]: PA Zenovia Jordan, NO Other Clinician: Referring Karielle Davidow: Treating [name]/Extender: [name], [name], Referral Weeks in Treatment: 6 Compression Therapy Performed for Wound Assessment: Wound #7 Right,Lateral Lower Leg Performed By: [name], RN Compression Type: [place] Patricia ([id number]) [id number] Page 3 of 9 Post Procedure Diagnosis Same as Pre-procedure Electronic Signature(s) Signed: 08/08/2023 3:22:15 PM By: Midge Aver MSN RN CNS WTA Entered By: Midge Aver on 08/08/2023 12:22:15 -------------------------------------------------------------------------------- Encounter Discharge Information Details Patient Name: Date of Service: [facility], [name] 08/08/2023 9:30 A M Medical Record Number: [id number] Patient Account Number: [id number] Date of Birth/Sex: Treating RN: 18-Oct-1981 (42 y.o. [name] Primary Care [name]: PA Zenovia Jordan, NO Other Clinician: Referring [name]: Treating [name]/Extender: [name], [name], Referral Weeks in Treatment: 6 Encounter Discharge Information Items Post Procedure Vitals Discharge Condition: Stable Temperature (F): 97.7 Ambulatory Status: Ambulatory Pulse (bpm): 62 Discharge Destination: Home Respiratory Rate (breaths/min): 18 Transportation: Private Auto Blood Pressure (mmHg): 135/85 Accompanied By: self Schedule Follow-up Appointment: Yes Clinical Summary of Care: Electronic Signature(s) Signed: 08/08/2023 3:23:19 PM By: Midge Aver MSN RN CNS WTA Entered By:  Midge Aver on 08/08/2023 12:23:19 -------------------------------------------------------------------------------- Lower Extremity Assessment Details Patient Name: Date of Service: [name], [name] 08/08/2023 9:30 A M Medical Record Number: [id number] Patient Account Number: [id number] Date of Birth/Sex: Treating RN: 02/20/1981 (42 y.o. [name] Primary Care Nehan Flaum: PA Zenovia Jordan, NO Other Clinician: Referring Larna Capelle: Treating Lindia Garms/Extender: [name], [name], Referral Weeks in Treatment: 6 Edema Assessment Assessed: [Left: No] [Right: No] [Left: Edema] [Right: :] Calf Left: RightKELCIE, BANASZAK [name] ([id number]) [id number] Page 4 of 9 Point of Measurement: 38 cm From Medial Instep 59 cm Ankle Left: Right: Point of Measurement: 13 cm From Medial Instep 40 cm Vascular Assessment Pulses: Dorsalis Pedis Palpable: [Right:Yes] Extremity colors, hair growth, and conditions: Extremity Color: [Right:Dusky] Hair Growth on Extremity: [Right:No] Temperature of Extremity: [Right:Warm] Capillary Refill: [Right:< 3 seconds] Dependent Rubor: [Right:No] Blanched when Elevated: [Right:No No] Toe Nail Assessment Left: Right: Thick: Yes Discolored: Yes Deformed: Yes Improper Length and Hygiene: Yes Electronic Signature(s) Signed: 08/08/2023 5:31:20 PM By: Midge Aver MSN RN CNS WTA Entered By: Midge Aver on 08/08/2023 07:10:59 -------------------------------------------------------------------------------- Multi Wound Chart Details Patient Name: Date of Service: [name], [name] 08/08/2023 9:30 A M Medical Record Number: [id number] Patient Account Number: [id number] Date of Birth/Sex: Treating RN: [birth year] (42 y.o. [name] Primary Care Markian Glockner: PA TIENT, NO Other Clinician: Referring Tarance Balan: Treating Stephanye Finnicum/Extender: [name], [name], Referral Weeks in Treatment: 6 Vital Signs Height(in): 73 Pulse(bpm):  62 Weight(lbs): 436 Blood Pressure(mmHg): 135/85 Body Mass Index(BMI): 57.5 Temperature(F): 97.7 Respiratory Rate(breaths/min): 18 [7:Photos:] [N/A:N/A] Right, Lateral Lower Leg N/A N/A Wound Location: MAYELLA, CICCHINO [name] ([id number]) [id number] Page 5 of 9 Gradually Appeared N/A N/A Wounding Event: Venous Leg Ulcer N/A N/A Primary Etiology: Lymphedema, Hypertension N/A N/A Comorbid History: 06/06/2023 N/A N/A Date Acquired: 6 N/A N/A Weeks of Treatment: Open N/A N/A Wound Status: No N/A N/A Wound Recurrence: 1.5x1.5x0.2 N/A N/A Measurements L x W x D (cm) 1.767 N/A N/A A (cm) : rea 0.353 N/A N/A Volume (cm) : 48.90% N/A N/A % Reduction in A rea: 66.00% N/A N/A % Reduction in  Volume: Full Thickness Without Exposed N/A N/A Classification: Support Structures None Present N/A N/A Exudate Amount: Small (1-33%) N/A N/A Granulation Amount: Red, Pink N/A N/A Granulation Quality: Medium (34-66%) [name]: Fat Layer (Subcutaneous Tissue): Yes N/A N/A Exposed Structures: Fascia: No Tendon: No Muscle: No Joint: No Bone: No None N/A N/A Epithelialization: Treatment Notes Electronic Signature(s) Signed: 08/08/2023 5:31:20 PM By: Midge Aver MSN RN CNS WTA Entered By: Midge Aver on 08/08/2023 07:18:56 -------------------------------------------------------------------------------- Multi-Disciplinary Care Plan Details Patient Name: Date of Service: [name], [name] 08/08/2023 9:30 A M Medical Record Number: [id number] Patient Account Number: [id number] Date of Birth/Sex: Treating RN: January 25, 1981 (42 y.o. [name] Primary Care Otto Caraway: PA Zenovia Jordan, NO Other Clinician: Referring Chazlyn Cude: Treating Shaia Porath/Extender: [name], [name], Referral Weeks in Treatment: 6 Active Inactive Venous Leg Ulcer Nursing Diagnoses: Actual venous Insuffiency (use after diagnosis is confirmed) Knowledge deficit related to disease  process and management Goals: Patient will maintain optimal edema control Date Initiated: 06/27/2023 Date Inactivated: 08/01/2023 Target Resolution Date: 07/28/2023 Goal Status: Met Patient/caregiver will verbalize understanding of disease process and disease management Date Initiated: 06/27/2023 Date Inactivated: 08/01/2023 Target Resolution Date: 07/28/2023 Goal Status: Met Verify adequate tissue perfusion prior to therapeutic compression application Date Initiated: 06/27/2023 Target Resolution Date: 08/27/2023 Goal Status: Active Interventions: Assess peripheral edema status every visit. KIERSYN, DRUKER [name] ([id number]) [id number] Page 6 of 9 Compression as ordered Provide education on venous insufficiency Treatment Activities: Non-invasive vascular studies : 06/27/2023 T ordered outside of clinic : 06/27/2023 est Therapeutic compression applied : 06/27/2023 Notes: Wound/Skin Impairment Nursing Diagnoses: Impaired tissue integrity Knowledge deficit related to ulceration/compromised skin integrity Goals: Patient/caregiver will verbalize understanding of skin care regimen Date Initiated: 06/27/2023 Date Inactivated: 08/01/2023 Target Resolution Date: 07/28/2023 Goal Status: Met Ulcer/skin breakdown will have a volume reduction of 30% by week 4 Date Initiated: 06/27/2023 Date Inactivated: 08/01/2023 Target Resolution Date: 07/28/2023 Goal Status: Met Ulcer/skin breakdown will have a volume reduction of 50% by week 8 Date Initiated: 06/27/2023 Target Resolution Date: 08/27/2023 Goal Status: Active Ulcer/skin breakdown will have a volume reduction of 80% by week 12 Date Initiated: 06/27/2023 Target Resolution Date: 09/27/2023 Goal Status: Active Interventions: Assess patient/caregiver ability to obtain necessary supplies Assess patient/caregiver ability to perform ulcer/skin care regimen upon admission and as needed Assess ulceration(s) every visit Provide education on ulcer and  skin care Treatment Activities: Skin care regimen initiated : 06/27/2023 Notes: Electronic Signature(s) Signed: 08/08/2023 5:31:20 PM By: Midge Aver MSN RN CNS WTA Entered By: Midge Aver on 08/08/2023 07:21:04 -------------------------------------------------------------------------------- Pain Assessment Details Patient Name: Date of Service: [name], [name] 08/08/2023 9:30 A M Medical Record Number: [id number] Patient Account Number: [id number] Date of Birth/Sex: Treating RN: January 31, 1981 (42 y.o. [name] Primary Care Serrita Lueth: PA Zenovia Jordan, NO Other Clinician: Referring Meghanne Pletz: Treating Erie Radu/Extender: [name], [name], Referral Weeks in Treatment: 6 Active Problems Location of Pain Severity and Description of Pain Patient Has Paino No Site Locations [place] [name], Lulu [name] ([id number]) [id number] Page 7 of 9 Pain Management and Medication Current Pain Management: Electronic Signature(s) Signed: 08/08/2023 5:31:20 PM By: Midge Aver MSN RN CNS WTA Entered By: Midge Aver on 08/08/2023 07:01:55 -------------------------------------------------------------------------------- Patient/Caregiver Education Details Patient Name: Date of Service: [name], [name] 9/18/2024andnbsp9:30 A M Medical Record Number: [id number] Patient Account Number: [id number] Date of Birth/Gender: Treating RN: [birth year] (42 y.o. [name] Primary Care Physician: PA Zenovia Jordan, NO Other Clinician: Referring Physician: Treating Physician/Extender: [name],  [name], Referral Weeks in Treatment: 6 Education Assessment Education Provided To: Patient Education Topics Provided Wound Debridement: Handouts: Wound Debridement Methods: Explain/Verbal Responses: State content correctly Wound/Skin Impairment: Handouts: Caring for Your Ulcer Methods: Explain/Verbal Responses: State content correctly Electronic Signature(s) Signed: 08/08/2023 5:31:20 PM By:  Midge Aver MSN RN CNS WTA Entered By: Midge Aver on 08/08/2023 07:21:23 [name] ([id number]) [id number] Page 8 of 9 -------------------------------------------------------------------------------- Wound Assessment Details Patient Name: Date of Service: [name], [name] 08/08/2023 9:30 A M Medical Record Number: [id number] Patient Account Number: [id number] Date of Birth/Sex: Treating RN: 1981-09-20 (42 y.o. [name] Primary Care Tasnim Balentine: PA TIENT, NO Other Clinician: Referring Yarielis Funaro: Treating Celia Gibbons/Extender: [name], [name], Referral Weeks in Treatment: 6 Wound Status Wound Number: 7 Primary Etiology: Venous Leg Ulcer Wound Location: Right, Lateral Lower Leg Wound Status: Open Wounding Event: Gradually Appeared Comorbid History: Lymphedema, Hypertension Date Acquired: 06/06/2023 Weeks Of Treatment: 6 Clustered Wound: No Photos Wound Measurements Length: (cm) 1.5 Width: (cm) 1.5 Depth: (cm) 0.2 Area: (cm) 1.767 Volume: (cm) 0.353 % Reduction in Area: 48.9% % Reduction in Volume: 66% Epithelialization: None Wound Description Classification: Full Thickness Without Exposed Suppor Exudate Amount: None Present t Structures Foul Odor After Cleansing: No Slough/Fibrino Yes Wound Bed Granulation Amount: Small (1-33%) Exposed Structure Granulation Quality: Red, Pink Fascia Exposed: No Necrotic Amount: Medium (34-66%) Fat Layer (Subcutaneous Tissue) Exposed: Yes Necrotic Quality: Adherent Slough Tendon Exposed: No Muscle Exposed: No Joint Exposed: No Bone Exposed: No Treatment Notes Wound #7 (Lower Leg) Wound Laterality: Right, Lateral Cleanser Soap and Water Discharge Instruction: Gently cleanse wound with antibacterial soap, rinse and pat dry prior to dressing wounds Vashe 5.8 (oz) Discharge Instruction: Use vashe 5.8 (oz) as directed [name], [name] ([id number]) [id number] Page 9 of  9 Peri-Wound Care Topical Gentamicin Discharge Instruction: Apply as directed by Kona Lover. Mupirocin Ointment Discharge Instruction: Apply as directed by Jennier Schissler. Primary Dressing Hydrofera Blue Ready Transfer Foam, 2.5x2.5 (in/in) Discharge Instruction: Apply Hydrofera Blue Ready to wound bed as directed Secondary Dressing ABD Pad 5x9 (in/in) Discharge Instruction: Cover with ABD pad Secured With Compression Wrap Urgo K2, two layer compression system, large Compression Stockings Add-Ons Electronic Signature(s) Signed: 08/08/2023 5:31:20 PM By: Midge Aver MSN RN CNS WTA Entered By: Midge Aver on 08/08/2023 07:08:55 -------------------------------------------------------------------------------- Vitals Details Patient Name: Date of Service: [name], [name] 08/08/2023 9:30 A M Medical Record Number: [id number] Patient Account Number: [id number] Date of Birth/Sex: Treating RN: [birth year] (42 y.o. [name] Primary Care Conan Mcmanaway: PA TIENT, NO Other Clinician: Referring Shota Kohrs: Treating Lauri Purdum/Extender: [name], [name], Referral Weeks in Treatment: 6 Vital Signs Time Taken: 09:58 Temperature (F): 97.7 Height (in): 73 Pulse (bpm): 62 Weight (lbs): 436 Respiratory Rate (breaths/min): 18 Body Mass Index (BMI): 57.5 Blood Pressure (mmHg): 135/85 Reference Range: 80 - 120 mg / dl Electronic Signature(s) Signed: 08/08/2023 5:31:20 PM By: Midge Aver MSN RN CNS WTA Entered By: Midge Aver on 08/08/2023 07:01:49

## 2023-08-15 ENCOUNTER — Encounter: Payer: 59 | Admitting: Internal Medicine

## 2023-08-15 DIAGNOSIS — I87311 Chronic venous hypertension (idiopathic) with ulcer of right lower extremity: Secondary | ICD-10-CM | POA: Diagnosis not present

## 2023-08-17 NOTE — Progress Notes (Signed)
SUNSET, JOSHI R ([id number]) [id number] Page 1 of 10 Visit Report for 08/15/2023 Debridement Details Patient Name: Date of Service: SIARRA, GILKERSON. 08/15/2023 8:30 A M Medical Record Number: [id number] Patient Account Number: [id number] Date of Birth/Sex: Treating RN: 1981-01-30 (42 y.o. [name] Primary Care Provider: PA Zenovia Jordan, NO Other Clinician: Referring Provider: Treating Provider/Extender: [name], [name], Referral Weeks in Treatment: 7 Debridement Performed for Assessment: Wound #7 Right,Lateral Lower Leg Performed By: Physician Maxwell Caul, MD Debridement Type: Debridement Severity of Tissue Pre Debridement: Fat layer exposed Level of Consciousness (Pre-procedure): Awake and Alert Pre-procedure Verification/Time Out Yes - 09:00 Taken: Start Time: 09:00 Pain Control: Lidocaine 4% T opical Solution Percent of Wound Bed Debrided: 100% T Area Debrided (cm): otal 1.77 Tissue and other material debrided: Viable, Non-Viable, Slough, Subcutaneous, Slough Level: Skin/Subcutaneous Tissue Debridement Description: Excisional Instrument: Curette Bleeding: Moderate Hemostasis Achieved: Pressure Procedural Pain: 0 Post Procedural Pain: 0 Response to Treatment: Procedure was tolerated well Level of Consciousness (Post- Awake and Alert procedure): Post Debridement Measurements of Total Wound Length: (cm) 1.5 Width: (cm) 1.5 Depth: (cm) 0.2 Volume: (cm) 0.353 Character of Wound/Ulcer Post Debridement: Stable Severity of Tissue Post Debridement: Fat layer exposed Post Procedure Diagnosis Same as Pre-procedure Electronic Signature(s) Signed: 08/15/2023 4:28:36 PM By: Baltazar Najjar MD Signed: 08/17/2023 12:30:42 PM By: Midge Aver MSN RN CNS WTA Entered By: Midge Aver on 08/15/2023 06:02:36 Holm, Sharmon R ([id number]) [id number] Page 2 of  10 -------------------------------------------------------------------------------- HPI Details Patient Name: Date [facility] Service[facility]int [facility]. 08/15/2023 8:30 A M Medical Record Number: [id number] Patient Account Number: [id number] Date of Birth/Sex: Treating RN: [birth year] (42 y.o. [name] Primary Care Provider: PA TIENT, NO Other Clinician: Referring Provider: Treating Provider/Extender: [name], [name], Referral Weeks in Treatment: 7 History of Present Illness HPI Description: 42 year old patient well known to our [facility] wound care clinic where she has been seen since 2016 for bilateral lower extremity venous insufficiency disease with lymphedema and multiple ulcerations associated with morbid obesity. she had custom-made compression stockings and lymphedema pumps which were used in the past. most recently she was admitted to the hospital between October 11 and 09/02/2017 with sepsis, lower extremity wounds and lymphedema.she was initially treated in the outpatient with Keflex and Bactrim. she was initially treated in the hospital with vancomycin and Zosyn and changed over to Unasyn until her white count improved and her blood cultures were negative for 3 days. After her inpatient management she was discharged home on Augmentin to end on 09/13/2017 with a 14 day course. she has had outpatient vascular duplex scans completed in November 2017 and her right ABI was 1.1 and the left ABI is 1.3. she had normal toe brachial indices bilaterally.she had three-vessel runoff in the right lower extremity and two-vessel runoff in the left lower extremity. On questioning the patient she does have custom made compression stockings and also has a lymphedema pump but has not been using it appropriately and has not been taking good care of herself. 09/17/2017 -- she returns today with compression stockings on the left side and the right side has had significant amount of drainage and  has a very strong odor 09/24/2017 -- the drainage is increased significantly and she has more lymphedema and a very strong odor to her wound. Though she does not have systemic symptoms, or overt infection I believe she will benefit from some doxycycline given empirically. 10/01/2017 -- after starting the doxycycline  and changing the dressing twice a week her symptoms and signs have definitely improved overall. 10/08/2017 -- she has completed her course of doxycycline but continues to have a lot of drainage and needs twice a week dressing changes. 11/08/17-she is here in follow-up evaluation for right lower extremity ulcers. She admits to using her lymphedema pumps twice daily, one hour per session. she is voicing no complaints or concerns, no signs of infection will change to [facility] 12/14/17 on evaluation today patient appears to be doing very well in regard to her wounds. She has been tolerating the dressing changes she continues to develop some portly the adherent granular tissue on the surface of the wound with some Slough. Obviously we are trying to get too much better wound bed. With that being said the hyper granulation the Hydrofera Blue Dressing to have helped with which is excellent news. However I think it may be time to try something a little bit different at this point. 01/11/18 on evaluation today patient appears to be doing fairly well in regard to her right lateral lower extremity ulcers. This shows excellent signs of filling in which is great news. There does not appear to be any evidence of infection which is also good news. She does continue to work as well is good school. She is having no pain. 01/22/18 on evaluation today patient appears to be doing a little bit worse in my opinion in regard to the overall quality of that granulation on her right lower extremity. She was not here last week due to being sick with a stomach virus this may have something to do with the fact that her  wound appears to be a little bit worse. With that being said I'm also thinking that after switching from the [facility] Dressing to the silver collagen would really has not looked that's good in my opinion. We may want to swit 02/11/18 on evaluation today patient's right lower/lateral lower extremity ulcers appear to be doing very well at this point. Especially the more proximal ulcer has filled in much closer to surface which is good news. Nonetheless both show signs of improvement which is great news. There does not appear to be any evidence of infection which is also good news. In general patient has been doing well tolerating the wraps as well as the [employer]. 02/18/18 on evaluation today patient appears to be doing a little bit worse in regard to the periwound region the wounds themselves do not look much deteriorated to me. With that being said she has several small blisters/pustules noted in the periwound and there was a significant amount of drainage and maceration compared to previous. There has been a time that we had to bring her back for twice a week dressing changes as far as her wrap was concerned it has been a while since we've done that however. With that being said the patient has been having some burning and in general I'm concerned about the possibility of infection. She has previously taken doxycycline with good result. Fortunately there does not appear to be any evidence of overall worsening in regard to the size of the wound and in fact the upper wound actually appears to be showing signs of good epithelialization. 03/18/18 on evaluation today patient appears to be doing excellent in regard to her right lateral lower extremity ulcer. She has been tolerating the dressing changes without complication. Fortunately this seems to be making great progress. Overall I see no signs of infection  and there is dramatic improvement overall even compared to last week. 03/28/18 on  evaluation today patient appears to be doing very well in regard to her right lateral lower surety ulcer. She has been tolerating the dressing changes without complication at this point. She states currently that she's having no significant discomfort which is excellent as well. Overall I'm pleased with how things seem to be progressing. 04/01/18 on evaluation today patient appears to be doing excellent in regard to her right lateral lower extremity ulcers. She has been tolerating the dressing changes without complication which is good news. With that being said the wraps still continues to show signs of helping with her fluid she does have Juxta-Lite compression stockings for when we are done with the current treatment regimen once everything heals. Mainly she just has the one area still remaining the smaller of the two wings is pretty much closed at this point there's just a very slight opening noted. KAMORA, VOSSLER R ([id number]) [id number] Page 3 of 10 04/08/18 on evaluation today patient appears to be doing better in regard to the original wound on the right lateral lower extremity that we have been managing. Unfortunately she has a new area of weeping more anterior on the right lateral lower extremity and on the left lower extremity she has two new ulcers there appears to be some cellulitis noted at this time. I am concerned about the fact that this may in fact be an infection that has caused the worsening and swelling in the past this has been the case when we previously attempted to determine what was going on when she had down slides like this. With that being said the patient is seeming to tolerate the wraps fairly well for the most part. 04/17/18; since last time the patient was seen in this clinic she was hospitalized from 04/11/18 through 04/13/18; she presented with bilateral lower extremity pain worse on the right and a fever of up to 104. Noteworthy that when she  was in the clinic last week she had new wounds on the left leg culture grew MRSA and she was prescribed Bactrim. She had 2 days of IV Vanco and Zosyn in the hospital. Her blood cultures were negative. She was discharged on Bactrim to cover the original MRSA on the right leg and Keflex to cover the possibility of strep. The hospitalist had a conversation with infectious disease. The patient arrives in clinic today for nurse check however given the recent hospitalization I was asked to look at her. The patient states she feels a lot better. No fever or chills. Still some pain in the right calf but a lot better. She arrived in the hospital with a white count of 15.6, the next day was 10.8. Comprehensive metabolic panel was normal. She is still taking Keflex and Bactrim It would appear that she had a surgical IandD at the bedside of the right calf felt to have a underlying abscess. According to our intake nurse the wound has expanded quite bit on the right lateral calf. She has no open area on the left anterior and left posterior calf as described last time 04/23/18 on evaluation today patient actually appears to be doing much better than when I last saw her. This obviously has been a couple weeks ago and in the interim she was admitted to the hospital for IV antibiotic therapy due to cellulitis, discharge, and fortunately the substance which hadn't sued has completely resolved. Her swelling seems to be better in regard  to her lower extremities as well and the wounds that opened up as results of the infection seems to be showing signs of improvement. There is some Slough noted on the left lateral wound otherwise a lot of weeping noted of the right lateral leg. 04/29/18 on evaluation today patient appears to be doing excellent in regard to her bilateral lower extremity ulcers. She's been tolerating the dressing changes without complication there does not appear to be any evidence of infection at this time.  Overall I think she is made great improvement and seems to be showing signs of coming back around where she was prior to the cellulitis and sepsis episode. 05/06/18 on evaluation today patient appears to be doing better in regard to her bilateral ocean the ulcers. She's been tolerating the dressing changes without complication. Fortunately there does not appear to be any evidence of infection at this time. Her swelling is significantly down overall seems to be showing signs of good improvement as well. Fortunately I do believe that she is progressing nicely back to where she was prior to the cellulitis/sepsis issue. 05/13/18 on evaluation today patient seems to be showing signs of great improvement she's been tolerating the dressing changes without complication and overall there does not appear to be any evidence of infection. All of which is good news. The only issue she really have today is that some of the [facility] Dressing actually was stuck to the periwound of the right lateral lower extremity however this was able to be removed during the debridement without complication. 05/21/18 on evaluation today patient appears to be doing very well in regard to her bilateral lower extremity ulcers. She has been tolerating the compression wraps without complication. There does not appear to be any evidence of infection at this point which is good news as well. Overall I'm very pleased with the progress that has been made. She likewise is very happy. 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 05/28/18 on evaluation today patient actually appears to be doing excellent in regard to her bilateral lower [place] ulcers. She's made great progress since last week and overall I'm very pleased in this regard. She states that she's not have any discomfort either which is also good news there's definitely  no evidence of infection. 06/03/18 on evaluation today patient actually appears to be doing excellent in regard to her ulcerations in fact these areas on both lower extremities were almost completely healed. Unfortunately she has a situation going on her life right now she actually found her husband deceased last 09/27/2023. She states that she has been quite a bit upset since that time unfortunately this is obviously a very big blow to her. Nonetheless she is concerned about the funeral and having to wear certain shoes for this that she states she cannot fit in with the wraps. She wonders if she could potentially come back on 09/27/23 to have her wraps changed and to switch into her Juxta-Lite compression at that point. I think that would be an okay thing that we could do for her. Nonetheless she fortunately seems to be tolerating the wraps very well and again has made excellent progress with the [facility] Dressing 06/11/18 on evaluation today patient appears to be doing rather well in regard to her bilateral lower extremities in fact everything appears to be completely healed at this point which is excellent news. There does  not appear to be any evidence of infection at this time which is great. In fact overall I do believe she is completely resolved in regard to her bilateral lower extremity ulcers. She is extremely happy in this regard. 06/27/2023 Ms. Alexsus Papadopoulos is a 42 year old female with a past medical history of venous insufficiency and lymphedema that presents to the clinic for a 3-week history of nonhealing wound to the right lower extremity. She states this started spontaneously. She does not wear compression stockings. She has been keeping the area covered. She currently denies signs of infection. 8/14; patient readmitted to clinic last week. She has venous insufficiency and chronic secondary lymphedema. She has a nonhealing wound on the right lateral lower leg. She has been using gent  mupirocin and Hydrofera Blue using an Urgo K2 lite wrap. Her ABI on the right leg was 0.74 on admission to clinic. Formal arterial studies were ordered but we have not yet had any contact with the patient. Also there was some question about juxta lites for this patient we have not heard anything about that either. 8/28; patient presents for follow-up. She had ABIs completed on 8/23 that showed noncompressible ABIs on the right and left with TBI of 0.88 on the left and 0.96 on the right. She had triphasic waveforms throughout the arterial system. This is reassuring that she has adequate blood flow for healing. We have been using antibiotic ointment with Hydrofera Blue under Urgo K2 lite wrap. She has tolerated this well. Wound is smaller. 9/4; patient presents for follow-up. We have been using Urgo 2 regular4-layer equivalent as well as Hydrofera Blue and antibiotic ointment. Wound is slightly smaller. She has no issues or complaints. She tolerated the wrap well. 9/11; patient presents for follow-up. We have been using Hydrofera Blue with antibiotic ointment under Urgo regular compression. She states the wrap slid down on Sunday and she has been keeping the area covered for the past 2 to 3 days. 9/18; small wound on the right lateral lower leg this is in the setting of significant lymphedema. We have you been using gent and mupirocin with Hydrofera Blue and Urgo K2 regular compression. The wound is about the same size this week. At 1 point she got wraparound stockings/external compression stockings during her time in our [place] sister clinic. I am not sure she is actually been wearing these in fact I think not 9/25; right lateral leg in the setting of very significant stage III lymphedema. We have been using gent and mupirocin under Hydrofera Blue and using an Urgo K2 lite. She works in a Museum/gallery exhibitions officer) Signed: 08/15/2023 4:28:36 PM By: Baltazar Najjar MD Entered By: Baltazar Najjar on 08/15/2023 06:09:47 [name] ([id number]) [id number] Page 4 of 10 -------------------------------------------------------------------------------- Physical Exam Details Patient Name: Date of ServiceClint [facility]ipps [facility], Nataki R. 08/15/2023 8:30 A M Medical Record Number: [id number] Patient Account Number: [id number] Date of Birth/Sex: Treating RN: [birth year] (42 y.o. [name] Primary Care Provider: PA Zenovia Jordan, NO Other Clinician: Referring Provider: Treating Provider/Extender: [name], [name], Referral Weeks in Treatment: 7 Constitutional Patient is hypertensive.. Pulse regular and within target range for patient.Marland Kitchen Respirations regular, non-labored and within target range.. Temperature is normal and within the target range for the patient.Marland Kitchen appears in no distress. Cardiovascular Even through the edema I am able to feel her peripheral pulses her feet are warm. Poorly controlled lymphedema. Fortunately no evidence of surrounding infection. Notes Wound exam; poorly controlled lymphedema. Small circular  wound but under illumination a again a nonviable surface. Again I used a #5 curette to debride the fibrinous debris of this down into the subcutaneous tissue. Hemostasis with a pressure dressing Electronic Signature(s) Signed: 08/15/2023 4:28:36 PM By: Baltazar Najjar MD Entered By: Baltazar Najjar on 08/15/2023 06:11:23 -------------------------------------------------------------------------------- Physician Orders Details Patient Name: Date of Service: [facility], Tayten R. 08/15/2023 8:30 A M Medical Record Number: [id number] Patient Account Number: [id number] Date of Birth/Sex: Treating RN: Aug 03, 1981 (42 y.o. [name] Primary Care Provider: PA Zenovia Jordan, NO Other Clinician: Referring Provider: Treating Provider/Extender: [name], [name], Referral Weeks in Treatment: 7 Verbal / Phone Orders: No Diagnosis Coding Follow-up  Appointments Return Appointment in 1 week. ppointment in: - nurse visit (one time only) 8/30 Return A Bathing/ Shower/ Hygiene May shower with wound dressing protected with water repellent cover or cast protector. No tub bath. Anesthetic (Use 'Patient Medications' Section for Anesthetic Order Entry) Lidocaine applied to wound bed Guastella, Rhandi R ([id number]) [id number] Page 5 of 10 Edema Control - Lymphedema / Segmental Compressive Device / Other Optional: One layer of unna paste to top of compression wrap (to act as an anchor). UrgoK2 - Large Wound Treatment Wound #7 - Lower Leg Wound Laterality: Right, Lateral Cleanser: Soap and Water 2 x Per Week/30 Days Discharge Instructions: Gently cleanse wound with antibacterial soap, rinse and pat dry prior to dressing wounds Cleanser: Vashe 5.8 (oz) 2 x Per Week/30 Days Discharge Instructions: Use vashe 5.8 (oz) as directed Topical: Gentamicin 2 x Per Week/30 Days Discharge Instructions: Apply as directed by provider. Topical: Mupirocin Ointment 2 x Per Week/30 Days Discharge Instructions: Apply as directed by provider. Prim Dressing: Hydrofera Blue Ready Transfer Foam, 2.5x2.5 (in/in) 2 x Per Week/30 Days ary Discharge Instructions: Apply Hydrofera Blue Ready to wound bed as directed Secondary Dressing: ABD Pad 5x9 (in/in) 2 x Per Week/30 Days Discharge Instructions: Cover with ABD pad Compression Wrap: Urgo K2, two layer compression system, large 2 x Per Week/30 Days Electronic Signature(s) Signed: 08/15/2023 4:28:36 PM By: Baltazar Najjar MD Signed: 08/17/2023 12:30:42 PM By: Midge Aver MSN RN CNS WTA Entered By: Midge Aver on 08/15/2023 06:03:25 -------------------------------------------------------------------------------- Problem List Details Patient Name: Date of Service: [facility], Ercel R. 08/15/2023 8:30 A M Medical Record Number: [id number] Patient Account Number: [id number] Date of Birth/Sex:  Treating RN: 02/26/1981 (42 y.o. [name] Primary Care Provider: PA TIENT, NO Other Clinician: Referring Provider: Treating Provider/Extender: [name], [name], Referral Weeks in Treatment: 7 Active Problems ICD-10 Encounter Code Description Active Date MDM Diagnosis L97.812 Non-pressure chronic ulcer of other part of right lower leg with fat layer 06/27/2023 No Yes exposed I87.311 Chronic venous hypertension (idiopathic) with ulcer of right lower extremity 06/27/2023 No Yes I89.0 Lymphedema, not elsewhere classified 06/27/2023 No Yes Inactive Problems Nigg, Demetrica R ([id number]) [id number] Page 6 of 10 Resolved Problems Electronic Signature(s) Signed: 08/15/2023 4:28:36 PM By: Baltazar Najjar MD Entered By: Baltazar Najjar on 08/15/2023 06:08:56 -------------------------------------------------------------------------------- Progress Note Details Patient Name: Date of Service: [facility], Jaeley R. 08/15/2023 8:30 A M Medical Record Number: [id number] Patient Account Number: [id number] Date of Birth/Sex: Treating RN: 10-18-1981 (42 y.o. [name] Primary Care Provider: PA Zenovia Jordan, NO Other Clinician: Referring Provider: Treating Provider/Extender: [name], [name], Referral Weeks in Treatment: 7 Subjective History of Present Illness (HPI) 42 year old patient well known to our [facility] wound care clinic where she has been seen since 2016 for bilateral  lower extremity venous insufficiency disease with lymphedema and multiple ulcerations associated with morbid obesity. she had custom-made compression stockings and lymphedema pumps which were used in the past. most recently she was admitted to the hospital between October 11 and 09/02/2017 with sepsis, lower extremity wounds and lymphedema.she was initially treated in the outpatient with Keflex and Bactrim. she was initially treated in the hospital with vancomycin and Zosyn and changed  over to Unasyn until her white count improved and her blood cultures were negative for 3 days. After her inpatient management she was discharged home on Augmentin to end on 09/13/2017 with a 14 day course. she has had outpatient vascular duplex scans completed in November 2017 and her right ABI was 1.1 and the left ABI is 1.3. she had normal toe brachial indices bilaterally.she had three-vessel runoff in the right lower extremity and two-vessel runoff in the left lower extremity. On questioning the patient she does have custom made compression stockings and also has a lymphedema pump but has not been using it appropriately and has not been taking good care of herself. 09/17/2017 -- she returns today with compression stockings on the left side and the right side has had significant amount of drainage and has a very strong odor 09/24/2017 -- the drainage is increased significantly and she has more lymphedema and a very strong odor to her wound. Though she does not have systemic symptoms, or overt infection I believe she will benefit from some doxycycline given empirically. 10/01/2017 -- after starting the doxycycline and changing the dressing twice a week her symptoms and signs have definitely improved overall. 10/08/2017 -- she has completed her course of doxycycline but continues to have a lot of drainage and needs twice a week dressing changes. 11/08/17-she is here in follow-up evaluation for right lower extremity ulcers. She admits to using her lymphedema pumps twice daily, one hour per session. she is voicing no complaints or concerns, no signs of infection will change to [facility] 12/14/17 on evaluation today patient appears to be doing very well in regard to her wounds. She has been tolerating the dressing changes she continues to develop some portly the adherent granular tissue on the surface of the wound with some Slough. Obviously we are trying to get too much better wound bed. With that being  said the hyper granulation the Hydrofera Blue Dressing to have helped with which is excellent news. However I think it may be time to try something a little bit different at this point. 01/11/18 on evaluation today patient appears to be doing fairly well in regard to her right lateral lower extremity ulcers. This shows excellent signs of filling in which is great news. There does not appear to be any evidence of infection which is also good news. She does continue to work as well is good school. She is having no pain. 01/22/18 on evaluation today patient appears to be doing a little bit worse in my opinion in regard to the overall quality of that granulation on her right lower extremity. She was not here last week due to being sick with a stomach virus this may have something to do with the fact that her wound appears to be a little bit worse. With that being said I'm also thinking that after switching from the [facility] Dressing to the silver collagen would really has not looked that's good in my opinion. We may want to swit 02/11/18 on evaluation today patient's right lower/lateral lower extremity ulcers appear to be  doing very well at this point. Especially the more proximal ulcer has filled in much closer to surface which is good news. Nonetheless both show signs of improvement which is great news. There does not appear to be any evidence of infection which is also good news. In general patient has been doing well tolerating the wraps as well as the [employer]. 02/18/18 on evaluation today patient appears to be doing a little bit worse in regard to the periwound region the wounds themselves do not look much deteriorated to me. With that being said she has several small blisters/pustules noted in the periwound and there was a significant amount of drainage and maceration compared to previous. There has been a time that we had to bring her back for twice a week dressing changes as far as  her wrap was concerned it has been a while since we've done that however. With that being said the patient has been having some burning and in general I'm concerned about the possibility of infection. She has previously taken doxycycline with good result. Fortunately there does not appear to be any evidence of overall worsening in regard to the size of the wound and in fact the upper wound actually appears to be showing signs of good epithelialization. 03/18/18 on evaluation today patient appears to be doing excellent in regard to her right lateral lower extremity ulcer. She has been tolerating the dressing [place], Liberty R ([id number]) [id number] Page 7 of 10 changes without complication. Fortunately this seems to be making great progress. Overall I see no signs of infection and there is dramatic improvement overall even compared to last week. 03/28/18 on evaluation today patient appears to be doing very well in regard to her right lateral lower surety ulcer. She has been tolerating the dressing changes without complication at this point. She states currently that she's having no significant discomfort which is excellent as well. Overall I'm pleased with how things seem to be progressing. 04/01/18 on evaluation today patient appears to be doing excellent in regard to her right lateral lower extremity ulcers. She has been tolerating the dressing changes without complication which is good news. With that being said the wraps still continues to show signs of helping with her fluid she does have Juxta-Lite compression stockings for when we are done with the current treatment regimen once everything heals. Mainly she just has the one area still remaining the smaller of the two wings is pretty much closed at this point there's just a very slight opening noted. 04/08/18 on evaluation today patient appears to be doing better in regard to the original wound on the right lateral lower  extremity that we have been managing. Unfortunately she has a new area of weeping more anterior on the right lateral lower extremity and on the left lower extremity she has two new ulcers there appears to be some cellulitis noted at this time. I am concerned about the fact that this may in fact be an infection that has caused the worsening and swelling in the past this has been the case when we previously attempted to determine what was going on when she had down slides like this. With that being said the patient is seeming to tolerate the wraps fairly well for the most part. 04/17/18; since last time the patient was seen in this clinic she was hospitalized from 04/11/18 through 04/13/18; she presented with bilateral lower extremity pain worse on the right and a fever of up to 104. Noteworthy  that when she was in the clinic last week she had new wounds on the left leg culture grew MRSA and she was prescribed Bactrim. She had 2 days of IV Vanco and Zosyn in the hospital. Her blood cultures were negative. She was discharged on Bactrim to cover the original MRSA on the right leg and Keflex to cover the possibility of strep. The hospitalist had a conversation with infectious disease. The patient arrives in clinic today for nurse check however given the recent hospitalization I was asked to look at her. The patient states she feels a lot better. No fever or chills. Still some pain in the right calf but a lot better. She arrived in the hospital with a white count of 15.6, the next day was 10.8. Comprehensive metabolic panel was normal. She is still taking Keflex and Bactrim It would appear that she had a surgical IandD at the bedside of the right calf felt to have a underlying abscess. According to our intake nurse the wound has expanded quite bit on the right lateral calf. She has no open area on the left anterior and left posterior calf as described last time 04/23/18 on evaluation today patient actually  appears to be doing much better than when I last saw her. This obviously has been a couple weeks ago and in the interim she was admitted to the hospital for IV antibiotic therapy due to cellulitis, discharge, and fortunately the substance which hadn't sued has completely resolved. Her swelling seems to be better in regard to her lower extremities as well and the wounds that opened up as results of the infection seems to be showing signs of improvement. There is some Slough noted on the left lateral wound otherwise a lot of weeping noted of the right lateral leg. 04/29/18 on evaluation today patient appears to be doing excellent in regard to her bilateral lower extremity ulcers. She's been tolerating the dressing changes without complication there does not appear to be any evidence of infection at this time. Overall I think she is made great improvement and seems to be showing signs of coming back around where she was prior to the cellulitis and sepsis episode. 05/06/18 on evaluation today patient appears to be doing better in regard to her bilateral ocean the ulcers. She's been tolerating the dressing changes without complication. Fortunately there does not appear to be any evidence of infection at this time. Her swelling is significantly down overall seems to be showing signs of good improvement as well. Fortunately I do believe that she is progressing nicely back to where she was prior to the cellulitis/sepsis issue. 05/13/18 on evaluation today patient seems to be showing signs of great improvement she's been tolerating the dressing changes without complication and overall there does not appear to be any evidence of infection. All of which is good news. The only issue she really have today is that some of the [facility] Dressing actually was stuck to the periwound of the right lateral lower extremity however this was able to be removed during the debridement without complication. 05/21/18 on  evaluation today patient appears to be doing very well in regard to her bilateral lower extremity ulcers. She has been tolerating the compression wraps without complication. There does not appear to be any evidence of infection at this point which is good news as well. Overall I'm very pleased with the progress that has been made. She likewise is very happy. 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 05/28/18 on evaluation today patient actually appears to be doing excellent in regard to her bilateral lower [place] ulcers. She's made great progress since last week and overall I'm very pleased in this regard. She states that she's not have any discomfort either which is also good news there's definitely no evidence of infection. 06/03/18 on evaluation today patient actually appears to be doing excellent in regard to her ulcerations in fact these areas on both lower extremities were almost completely healed. Unfortunately she has a situation going on her life right now she actually found her husband deceased last Sep 19, 2023. She states that she has been quite a bit upset since that time unfortunately this is obviously a very big blow to her. Nonetheless she is concerned about the funeral and having to wear certain shoes for this that she states she cannot fit in with the wraps. She wonders if she could potentially come back on 09-19-2023 to have her wraps changed and to switch into her Juxta-Lite compression at that point. I think that would be an okay thing that we could do for her. Nonetheless she fortunately seems to be tolerating the wraps very well and again has made excellent progress with the [facility] Dressing 06/11/18 on evaluation today patient appears to be doing rather well in regard to her bilateral lower extremities in fact everything appears to be completely healed at this point  which is excellent news. There does not appear to be any evidence of infection at this time which is great. In fact overall I do believe she is completely resolved in regard to her bilateral lower extremity ulcers. She is extremely happy in this regard. 06/27/2023 Ms. Tabetha Haraway is a 42 year old female with a past medical history of venous insufficiency and lymphedema that presents to the clinic for a 3-week history of nonhealing wound to the right lower extremity. She states this started spontaneously. She does not wear compression stockings. She has been keeping the area covered. She currently denies signs of infection. 8/14; patient readmitted to clinic last week. She has venous insufficiency and chronic secondary lymphedema. She has a nonhealing wound on the right lateral lower leg. She has been using gent mupirocin and Hydrofera Blue using an Urgo K2 lite wrap. Her ABI on the right leg was 0.74 on admission to clinic. Formal arterial studies were ordered but we have not yet had any contact with the patient. Also there was some question about juxta lites for this patient we have not heard anything about that either. 8/28; patient presents for follow-up. She had ABIs completed on 8/23 that showed noncompressible ABIs on the right and left with TBI of 0.88 on the left and 0.96 on the right. She had triphasic waveforms throughout the arterial system. This is reassuring that she has adequate blood flow for healing. We have been using antibiotic ointment with Hydrofera Blue under Urgo K2 lite wrap. She has tolerated this well. Wound is smaller. 9/4; patient presents for follow-up. We have been using Urgo 2 regular4-layer equivalent as well as Hydrofera Blue and antibiotic ointment. Wound is slightly smaller. She has no issues or complaints. She tolerated the wrap well. 9/11; patient presents for follow-up. We have been using Hydrofera Blue with antibiotic ointment under Urgo regular compression. She  states the wrap slid down on Sunday and she has been keeping the area covered for the past 2 to 3 days. 9/18;  small wound on the right lateral lower leg this is in the setting of significant lymphedema. We have you been using gent and mupirocin with Hydrofera Blue and Urgo K2 regular compression. The wound is about the same size this week. BELMA, DYCHES R ([id number]) [id number] Page 8 of 10 At 1 point she got wraparound stockings/external compression stockings during her time in our [facility]. I am not sure she is actually been wearing these in fact I think not 9/25; right lateral leg in the setting of very significant stage III lymphedema. We have been using gent and mupirocin under Hydrofera Blue and using an Urgo K2 lite. She works in a daycare Objective Constitutional Patient is hypertensive.. Pulse regular and within target range for patient.Marland Kitchen Respirations regular, non-labored and within target range.. Temperature is normal and within the target range for the patient.Marland Kitchen appears in no distress. Vitals Time Taken: 8:42 AM, Height: 73 in, Weight: 436 lbs, BMI: 57.5, Temperature: 98.0 F, Pulse: 98 bpm, Respiratory Rate: 18 breaths/min, Blood Pressure: 163/90 mmHg. Cardiovascular Even through the edema I am able to feel her peripheral pulses her feet are warm. Poorly controlled lymphedema. Fortunately no evidence of surrounding infection. General Notes: Wound exam; poorly controlled lymphedema. Small circular wound but under illumination a again a nonviable surface. Again I used a #5 curette to debride the fibrinous debris of this down into the subcutaneous tissue. Hemostasis with a pressure dressing Integumentary (Hair, Skin) Wound #7 status is Open. Original cause of wound was Gradually Appeared. The date acquired was: 06/06/2023. The wound has been in treatment 7 weeks. The wound is located on the Right,Lateral Lower Leg. The wound measures 1.5cm  length x 1.5cm width x 0.2cm depth; 1.767cm^2 area and 0.353cm^3 volume. There is Fat Layer (Subcutaneous Tissue) exposed. There is a none present amount of drainage noted. There is small (1-33%) red, pink granulation within the wound bed. There is a medium (34-66%) amount of necrotic tissue within the wound bed including Adherent Slough. 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 There was a Excisional Skin/Subcutaneous Tissue Debridement with a total area of 1.77 sq cm performed by Maxwell Caul, MD. With the following instrument(s): Curette to remove Viable and Non-Viable tissue/material. Material removed includes Subcutaneous Tissue and Slough and after achieving pain control using Lidocaine 4% T opical Solution. No specimens were taken. A time out was conducted at 09:00, prior to the start of the procedure. A Moderate amount of bleeding was controlled with Pressure. The procedure was tolerated well with a pain level of 0 throughout and a pain level of 0 following the procedure. Post Debridement Measurements: 1.5cm length x 1.5cm width x 0.2cm depth; 0.353cm^3 volume. Character of Wound/Ulcer Post Debridement is stable. Severity of Tissue Post Debridement is: Fat layer exposed. Post procedure Diagnosis Wound #7: Same as Pre-Procedure Pre-procedure diagnosis of Wound #7 is a Venous Leg Ulcer located on the Right,Lateral Lower Leg . There was a Four Layer Compression Therapy Procedure by Midge Aver, RN. Post procedure Diagnosis Wound #7: Same as Pre-Procedure Plan Follow-up Appointments: Return Appointment in 1 week. Return  Appointment in: - nurse visit (one time only) 8/30 Bathing/ Shower/ Hygiene: May shower with wound  dressing protected with water repellent cover or cast protector. No tub bath. Anesthetic (Use 'Patient Medications' Section for Anesthetic Order Entry): Lidocaine applied to wound bed Ell, Denetta R ([id number]) [id number] Page 9 of 10 Edema Control - Lymphedema / Segmental Compressive Device / Other: Optional: One layer of unna paste to top of compression wrap (to act as an anchor). UrgoK2 - Large WOUND #7: - Lower Leg Wound Laterality: Right, Lateral Cleanser: Soap and Water 2 x Per Week/30 Days Discharge Instructions: Gently cleanse wound with antibacterial soap, rinse and pat dry prior to dressing wounds Cleanser: Vashe 5.8 (oz) 2 x Per Week/30 Days Discharge Instructions: Use vashe 5.8 (oz) as directed Topical: Gentamicin 2 x Per Week/30 Days Discharge Instructions: Apply as directed by provider. Topical: Mupirocin Ointment 2 x Per Week/30 Days Discharge Instructions: Apply as directed by provider. Prim Dressing: Hydrofera Blue Ready Transfer Foam, 2.5x2.5 (in/in) 2 x Per Week/30 Days ary Discharge Instructions: Apply Hydrofera Blue Ready to wound bed as directed Secondary Dressing: ABD Pad 5x9 (in/in) 2 x Per Week/30 Days Discharge Instructions: Cover with ABD pad Com pression Wrap: Urgo K2, two layer compression system, large 2 x Per Week/30 Days 1. I continued with the topical antibiotics Hydrofera Blue under and Urgo K2 lite 2. I am thinking if we cannot make progress here I may change to Iodoflex [for additional debridement properties] 3. Also made need to increase the compression. The edema here is not well-controlled. 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 Electronic Signature(s) Signed: 08/15/2023 4:28:36 PM By: Baltazar Najjar MD Entered By: Baltazar Najjar on 08/15/2023  06:12:26 -------------------------------------------------------------------------------- SuperBill Details Patient Name: Date of Service: Richrd Prime, Brittannie R. 08/15/2023 Medical Record Number: [id number] Patient Account Number: [id number] Date of Birth/Sex: Treating RN: 1981/10/16 (42 y.o. [name] Primary Care Provider: PA TIENT, NO Other Clinician: Referring Provider: Treating Provider/Extender: [name], [name], Referral Weeks in Treatment: 7 Diagnosis Coding ICD-10 Codes Code Description [id number] Non-pressure chronic ulcer of other part of right lower leg with fat layer exposed I87.311 Chronic venous hypertension (idiopathic) with ulcer of right lower extremity I89.0 Lymphedema, not elsewhere classified Facility Procedures : CPT4 Code: [id number] Description: 11042 - DEB SUBQ TISSUE 20 SQ CM/< ICD-10 Diagnosis Description L97.812 Non-pressure chronic ulcer of other part of right lower leg with fat layer exp I87.311 Chronic venous hypertension (idiopathic) with ulcer of right lower extremity I89.0  Lymphedema, not elsewhere classified Modifier: osed Quantity: 1 Physician Procedures : CPT4 Code Description Modifier [id number] 11042 - WC PHYS SUBQ TISS 20 SQ CM ICD-10 Diagnosis Description L97.812 Non-pressure chronic ulcer of other part of right lower leg with fat layer exposed I87.311 Chronic venous hypertension (idiopathic) with  ulcer of right lower extremity Guion, Verlia R ([id number]) [id number] P I89.0 Lymphedema, not elsewhere classified Quantity: 1 [age] Electronic Signature(s) Signed: 08/15/2023 4:28:36 PM By: Baltazar Najjar MD Entered By: Baltazar Najjar on 08/15/2023 06:12:38

## 2023-08-17 NOTE — Progress Notes (Signed)
JULIETH, TUGMAN [name] ([id number]) [id number].pdf Page 1 of 9 Visit Report for 08/15/2023 Arrival Information Details Patient Name: Date of Service: Jessica, [name]. 08/15/2023 8:30 A M Medical Record Number: [id number] Patient Account Number: [id number] Date of Birth/Sex: Treating RN: [birth year] (42 y.o. [name] Primary Care Lakie Mclouth: PA Zenovia Jordan, [name] Other Clinician: Referring Hindy Perrault: Treating Shell Blanchette/Extender: [name], [name], Referral Weeks in Treatment: 7 Visit Information History Since Last Visit Added or deleted any medications: [name] Patient Arrived: Ambulatory Any new allergies or adverse reactions: [name] Arrival Time: 08:36 Has Dressing in Place as Prescribed: Yes Accompanied By: self Has Compression in Place as Prescribed: Yes Transfer Assistance: None Pain Present Now: [name] Patient Identification Verified: Yes Secondary Verification Process Completed: Yes Patient Requires Transmission-Based Precautions: [name] Patient Has Alerts: [name] Electronic Signature(s) Signed: 08/17/2023 12:30:42 PM By: Midge Aver MSN RN CNS WTA Entered By: Midge Aver on 08/15/2023 05:36:20 -------------------------------------------------------------------------------- Clinic Level of Care Assessment Details Patient Name: Date of Service: [name], [name] 08/15/2023 8:30 A M Medical Record Number: [id number] Patient Account Number: [id number] Date of Birth/Sex: Treating RN: 1981/10/19 (42 y.o. [name] Primary Care Prem Coykendall: PA Zenovia Jordan, [name] Other Clinician: Referring Margree Gimbel: Treating Juanmanuel Marohl/Extender: [name], [name], Referral Weeks in Treatment: 7 Clinic Level of Care Assessment Items TOOL 1 Quantity Score []  - 0 Use when EandM and Procedure is performed on INITIAL visit ASSESSMENTS - Nursing Assessment / Reassessment []  - 0 General Physical Exam (combine w/ comprehensive assessment (listed just below) when performed on new pt. evals) []  -  0 Comprehensive Assessment (HX, ROS, Risk Assessments, Wounds Hx, etc.) ASSESSMENTS - Wound and Skin Assessment / Reassessment []  - 0 Dermatologic / Skin Assessment (not related to wound area) ASSESSMENTS - Ostomy and/or Continence Assessment and Care ANN, BOHNE [name] ([id number]) [id number] Page 2 of 9 []  - 0 Incontinence Assessment and Management []  - 0 Ostomy Care Assessment and Management (repouching, etc.) PROCESS - Coordination of Care []  - 0 Simple Patient / Family Education for ongoing care []  - 0 Complex (extensive) Patient / Family Education for ongoing care []  - 0 Staff obtains Chiropractor, Records, T Results / Process Orders est []  - 0 Staff telephones HHA, Nursing Homes / Clarify orders / etc []  - 0 Routine Transfer to another Facility (non-emergent condition) []  - 0 Routine [name] Admission (non-emergent condition) []  - 0 New Admissions / Manufacturing engineer / Ordering NPWT Apligraf, etc. , []  - 0 Emergency [name] Admission (emergent condition) PROCESS - Special Needs []  - 0 Pediatric / Minor Patient Management []  - 0 Isolation Patient Management []  - 0 Hearing / Language / Visual special needs []  - 0 Assessment of Community assistance (transportation, D/C planning, etc.) []  - 0 Additional assistance / Altered mentation []  - 0 Support Surface(s) Assessment (bed, cushion, seat, etc.) INTERVENTIONS - Miscellaneous []  - 0 External ear exam []  - 0 Patient Transfer (multiple staff / Nurse, adult / Similar devices) []  - 0 Simple Staple / Suture removal (25 or less) []  - 0 Complex Staple / Suture removal (26 or more) []  - 0 Hypo/Hyperglycemic Management (do not check if billed separately) []  - 0 Ankle / Brachial Index (ABI) - do not check if billed separately Has the patient been seen at the [name] within the last three years: Yes Total Score: 0 Level Of Care: ____ Electronic Signature(s) Signed: 08/17/2023 12:30:42 PM  By: Midge Aver MSN RN CNS WTA Entered By: Midge Aver on 08/15/2023 06:03:32 --------------------------------------------------------------------------------  Compression Therapy Details Patient Name: Date of Service: Jessica, [name]. 08/15/2023 8:30 A M Medical Record Number: [id number] Patient Account Number: [id number] Date of Birth/Sex: Treating RN: 1981-05-30 (42 y.o. [name] Primary Care Marlynn Hinckley: PA Zenovia Jordan, [name] Other Clinician: Referring Araceli Arango: Treating Sergi Gellner/Extender: [name], [name], Referral Weeks in Treatment: 7 Compression Therapy Performed for Wound Assessment: Wound #7 Right,Lateral Lower Leg Performed By: [name], RN Compression TypeLauna Flight Pleas Patricia ([id number]) [id number] Page 3 of 9 Post Procedure Diagnosis Same as Pre-procedure Electronic Signature(s) Signed: 08/17/2023 12:30:42 PM By: Midge Aver MSN RN CNS WTA Entered By: Midge Aver on 08/15/2023 06:02:49 -------------------------------------------------------------------------------- Encounter Discharge Information Details Patient Name: Date of Service: [name], [name] 08/15/2023 8:30 A M Medical Record Number: [id number] Patient Account Number: [id number] Date of Birth/Sex: Treating RN: 1980-12-24 (42 y.o. [name] Primary Care Olyvia Gopal: PA Zenovia Jordan, [name] Other Clinician: Referring Ark Agrusa: Treating Amr Sturtevant/Extender: [name], [name], Referral Weeks in Treatment: 7 Encounter Discharge Information Items Post Procedure Vitals Discharge Condition: Stable Temperature (F): 98.0 Ambulatory Status: Ambulatory Pulse (bpm): 98 Discharge Destination: Home Respiratory Rate (breaths/min): 18 Transportation: Private Auto Blood Pressure (mmHg): 163/90 Accompanied By: self Schedule Follow-up Appointment: Yes Clinical Summary of Care: Electronic Signature(s) Signed: 08/17/2023 12:30:42 PM By: Midge Aver MSN RN CNS WTA Entered  By: Midge Aver on 08/15/2023 06:17:55 -------------------------------------------------------------------------------- Lower Extremity Assessment Details Patient Name: Date of Service: [name], Ruthia [name]. 08/15/2023 8:30 A M Medical Record Number: [id number] Patient Account Number: [id number] Date of Birth/Sex: Treating RN: 04/24/1981 (42 y.o. [name] Primary Care Soloman Mckeithan: PA Zenovia Jordan, [name] Other Clinician: Referring Lynard Postlewait: Treating Christophr Calix/Extender: [name], [name], Referral Weeks in Treatment: 7 Edema Assessment Assessed: [Left: [name]] [Right: Yes] [Left: Edema] [Right: :] Calf Left: RightLOUGENIA, MORRISSEY [name] ([id number]) [id number] Page 4 of 9 Point of Measurement: 38 cm From Medial Instep 58 cm Ankle Left: Right: Point of Measurement: 13 cm From Medial Instep 40 cm Vascular Assessment Pulses: Dorsalis Pedis Palpable: [Right:Yes] Extremity colors, hair growth, and conditions: Extremity Color: [Right:Dusky] Temperature of Extremity: [Right:Warm] Capillary Refill: [Right:< 3 seconds] Dependent Rubor: [Right:[name]] Blanched when Elevated: [Right:[name]] Toe Nail Assessment Left: Right: Thick: Yes Discolored: Yes Deformed: Yes Improper Length and Hygiene: Yes Electronic Signature(s) Signed: 08/17/2023 12:30:42 PM By: Midge Aver MSN RN CNS WTA Entered By: Midge Aver on 08/15/2023 05:54:42 -------------------------------------------------------------------------------- Multi Wound Chart Details Patient Name: Date of Service: [name], [name] 08/15/2023 8:30 A M Medical Record Number: [id number] Patient Account Number: [id number] Date of Birth/Sex: Treating RN: 04-Sep-1981 (42 y.o. [name] Primary Care Chrystine Frogge: PA [name], [name] Other Clinician: Referring Khiem Gargis: Treating Bodi Palmeri/Extender: [name], [name], Referral Weeks in Treatment: 7 Vital Signs Height(in): 73 Pulse(bpm): 98 Weight(lbs): 436 Blood Pressure(mmHg):  163/90 Body Mass Index(BMI): 57.5 Temperature(F): 98.0 Respiratory Rate(breaths/min): 18 [7:Photos:] [N/A:N/A] Right, Lateral Lower Leg N/A N/A Wound Location: Gradually Appeared N/A N/A Wounding EventONDREA, DOW [name] ([id number]) [id number] Page 5 of 9 Venous Leg Ulcer N/A N/A Primary Etiology: Lymphedema, Hypertension N/A N/A Comorbid History: 06/06/2023 N/A N/A Date Acquired: 7 N/A N/A Weeks of Treatment: Open N/A N/A Wound Status: [name] N/A N/A Wound Recurrence: 1.5x1.5x0.2 N/A N/A Measurements L x W x D (cm) 1.767 N/A N/A A (cm) : rea 0.353 N/A N/A Volume (cm) : 48.90% N/A N/A % Reduction in A rea: 66.00% N/A N/A % Reduction in Volume: Full Thickness Without Exposed  N/A N/A Classification: Support Structures None Present N/A N/A Exudate Amount: Small (1-33%) N/A N/A Granulation Amount: Red, Pink N/A N/A Granulation Quality: Medium (34-66%) [name]: Fat Layer (Subcutaneous Tissue): Yes N/A N/A Exposed Structures: Fascia: [name] Tendon: [name] Muscle: [name] Joint: [name] Bone: [name] None N/A N/A Epithelialization: Treatment Notes Electronic Signature(s) Signed: 08/17/2023 12:30:42 PM By: Midge Aver MSN RN CNS WTA Entered By: Midge Aver on 08/15/2023 06:01:39 -------------------------------------------------------------------------------- Multi-Disciplinary Care Plan Details Patient Name: Date of Service: [facility], [name] 08/15/2023 8:30 A M Medical Record Number: [id number] Patient Account Number: [id number] Date of Birth/Sex: Treating RN: Aug 04, 1981 (42 y.o. [name] Primary Care Willies Laviolette: PA Zenovia Jordan, [name] Other Clinician: Referring Larico Dimock: Treating Abaigeal Moomaw/Extender: [name], [name], Referral Weeks in Treatment: 7 Active Inactive Venous Leg Ulcer Nursing Diagnoses: Actual venous Insuffiency (use after diagnosis is confirmed) Knowledge deficit related to disease process and management Goals: Patient will  maintain optimal edema control Date Initiated: 06/27/2023 Date Inactivated: 08/01/2023 Target Resolution Date: 07/28/2023 Goal Status: Met Patient/caregiver will verbalize understanding of disease process and disease management Date Initiated: 06/27/2023 Date Inactivated: 08/01/2023 Target Resolution Date: 07/28/2023 Goal Status: Met Verify adequate tissue perfusion prior to therapeutic compression application Date Initiated: 06/27/2023 Target Resolution Date: 08/27/2023 Goal Status: Active Interventions: Assess peripheral edema status every visit. MORGEN, LINEBAUGH [name] ([id number]) [id number] Page 6 of 9 Compression as ordered Provide education on venous insufficiency Treatment Activities: Non-invasive vascular studies : 06/27/2023 T ordered outside of clinic : 06/27/2023 est Therapeutic compression applied : 06/27/2023 Notes: Wound/Skin Impairment Nursing Diagnoses: Impaired tissue integrity Knowledge deficit related to ulceration/compromised skin integrity Goals: Patient/caregiver will verbalize understanding of skin care regimen Date Initiated: 06/27/2023 Date Inactivated: 08/01/2023 Target Resolution Date: 07/28/2023 Goal Status: Met Ulcer/skin breakdown will have a volume reduction of 30% by week 4 Date Initiated: 06/27/2023 Date Inactivated: 08/01/2023 Target Resolution Date: 07/28/2023 Goal Status: Met Ulcer/skin breakdown will have a volume reduction of 50% by week 8 Date Initiated: 06/27/2023 Target Resolution Date: 08/27/2023 Goal Status: Active Ulcer/skin breakdown will have a volume reduction of 80% by week 12 Date Initiated: 06/27/2023 Target Resolution Date: 09/27/2023 Goal Status: Active Interventions: Assess patient/caregiver ability to obtain necessary supplies Assess patient/caregiver ability to perform ulcer/skin care regimen upon admission and as needed Assess ulceration(s) every visit Provide education on ulcer and skin care Treatment Activities: Skin care  regimen initiated : 06/27/2023 Notes: Electronic Signature(s) Signed: 08/17/2023 12:30:42 PM By: Midge Aver MSN RN CNS WTA Entered By: Midge Aver on 08/15/2023 06:16:50 -------------------------------------------------------------------------------- Pain Assessment Details Patient Name: Date of Service: [name], Kearia [name]. 08/15/2023 8:30 A M Medical Record Number: [id number] Patient Account Number: [id number] Date of Birth/Sex: Treating RN: [birth year] (42 y.o. [name] Primary Care Alesa Echevarria: PA Zenovia Jordan, [name] Other Clinician: Referring Nima Kemppainen: Treating Brycelyn Gambino/Extender: [name], [name], Referral Weeks in Treatment: 7 Active Problems Location of Pain Severity and Description of Pain Patient Has Paino [name] Site Locations Ranger, Ereka [name] ([id number]) [id number] Page 7 of 9 Pain Management and Medication Current Pain Management: Electronic Signature(s) Signed: 08/17/2023 12:30:42 PM By: Midge Aver MSN RN CNS WTA Entered By: Midge Aver on 08/15/2023 05:45:15 -------------------------------------------------------------------------------- Patient/Caregiver Education Details Patient Name: Date of Service: [name], [name] 9/25/2024andnbsp8:30 A M Medical Record Number: [id number] Patient Account Number: [id number] Date of Birth/Gender: Treating RN: 1981-08-23 (42 y.o. [name] Primary Care Physician: PA Zenovia Jordan, [name] Other Clinician: Referring Physician: Treating Physician/Extender: [name], [name], Referral  Weeks in Treatment: 7 Education Assessment Education Provided To: Patient Education Topics Provided Wound Debridement: Handouts: Wound Debridement Methods: Explain/Verbal Responses: State content correctly Wound/Skin Impairment: Handouts: Caring for Your Ulcer Methods: Explain/Verbal Responses: State content correctly Electronic Signature(s) Signed: 08/17/2023 12:30:42 PM By: Midge Aver MSN RN CNS WTA Entered By:  Midge Aver on 08/15/2023 06:17:09 Karstens, Faduma [name] ([id number]) [id number] Page 8 of 9 -------------------------------------------------------------------------------- Wound Assessment Details Patient Name: Date of Service: [name], [name] 08/15/2023 8:30 A M Medical Record Number: [id number] Patient Account Number: [id number] Date of Birth/Sex: Treating RN: Nov 22, 1980 (42 y.o. [name] Primary Care Tyesha Joffe: PA [name], [name] Other Clinician: Referring Demia Viera: Treating Garrick Midgley/Extender: [name], [name], Referral Weeks in Treatment: 7 Wound Status Wound Number: 7 Primary Etiology: Venous Leg Ulcer Wound Location: Right, Lateral Lower Leg Wound Status: Open Wounding Event: Gradually Appeared Comorbid History: Lymphedema, Hypertension Date Acquired: 06/06/2023 Weeks Of Treatment: 7 Clustered Wound: [name] Photos Wound Measurements Length: (cm) 1.5 Width: (cm) 1.5 Depth: (cm) 0.2 Area: (cm) 1.767 Volume: (cm) 0.353 % Reduction in Area: 48.9% % Reduction in Volume: 66% Epithelialization: None Wound Description Classification: Full Thickness Without Exposed Suppor Exudate Amount: None Present t Structures Foul Odor After Cleansing: [name] Slough/Fibrino Yes Wound Bed Granulation Amount: Small (1-33%) Exposed Structure Granulation Quality: Red, Pink Fascia Exposed: [name] Necrotic Amount: Medium (34-66%) Fat Layer (Subcutaneous Tissue) Exposed: Yes Necrotic Quality: Adherent Slough Tendon Exposed: [name] Muscle Exposed: [name] Joint Exposed: [name] Bone Exposed: [name] Treatment Notes Wound #7 (Lower Leg) Wound Laterality: Right, Lateral Cleanser Soap and Water Discharge Instruction: Gently cleanse wound with antibacterial soap, rinse and pat dry prior to dressing wounds Vashe 5.8 (oz) Discharge Instruction: Use vashe 5.8 (oz) as directed JAZAE, GANDOLFI [name] ([id number]) [id number] Page 9 of 9 Peri-Wound  Care Topical Gentamicin Discharge Instruction: Apply as directed by Samil Mecham. Mupirocin Ointment Discharge Instruction: Apply as directed by Vince Ainsley. Primary Dressing Hydrofera Blue Ready Transfer Foam, 2.5x2.5 (in/in) Discharge Instruction: Apply Hydrofera Blue Ready to wound bed as directed Secondary Dressing ABD Pad 5x9 (in/in) Discharge Instruction: Cover with ABD pad Secured With Compression Wrap Urgo K2, two layer compression system, large Compression Stockings Add-Ons Electronic Signature(s) Signed: 08/17/2023 12:30:42 PM By: Midge Aver MSN RN CNS WTA Entered By: Midge Aver on 08/15/2023 05:53:44 -------------------------------------------------------------------------------- Vitals Details Patient Name: Date of Service: [name], Patrena [name]. 08/15/2023 8:30 A M Medical Record Number: [id number] Patient Account Number: [id number] Date of Birth/Sex: Treating RN: Jun 16, 1981 (42 y.o. [name] Primary Care Makenah Karas: PA [name], [name] Other Clinician: Referring Jameek Bruntz: Treating Kemar Pandit/Extender: [name], [name], Referral Weeks in Treatment: 7 Vital Signs Time Taken: 08:42 Temperature (F): 98.0 Height (in): 73 Pulse (bpm): 98 Weight (lbs): 436 Respiratory Rate (breaths/min): 18 Body Mass Index (BMI): 57.5 Blood Pressure (mmHg): 163/90 Reference Range: 80 - 120 mg / dl Electronic Signature(s) Signed: 08/17/2023 12:30:42 PM By: Midge Aver MSN RN CNS WTA Entered By: Midge Aver on 08/15/2023 05:45:05

## 2023-08-22 ENCOUNTER — Encounter: Payer: Self-pay | Attending: Internal Medicine | Admitting: Internal Medicine

## 2023-08-22 DIAGNOSIS — I872 Venous insufficiency (chronic) (peripheral): Secondary | ICD-10-CM | POA: Insufficient documentation

## 2023-08-22 DIAGNOSIS — I890 Lymphedema, not elsewhere classified: Secondary | ICD-10-CM | POA: Insufficient documentation

## 2023-08-22 DIAGNOSIS — L97812 Non-pressure chronic ulcer of other part of right lower leg with fat layer exposed: Secondary | ICD-10-CM | POA: Insufficient documentation

## 2023-08-22 DIAGNOSIS — I87311 Chronic venous hypertension (idiopathic) with ulcer of right lower extremity: Secondary | ICD-10-CM | POA: Insufficient documentation

## 2023-08-22 DIAGNOSIS — L97821 Non-pressure chronic ulcer of other part of left lower leg limited to breakdown of skin: Secondary | ICD-10-CM | POA: Insufficient documentation

## 2023-08-22 DIAGNOSIS — I10 Essential (primary) hypertension: Secondary | ICD-10-CM | POA: Insufficient documentation

## 2023-08-24 NOTE — Progress Notes (Signed)
Jessica, [name] ([id number]) [id number] Page 1 of 9 Visit Report for 08/22/2023 Debridement Details Patient Name: Date of Service: [name], [name] 08/22/2023 9:30 A M Medical Record Number: [id number] Patient Account Number: [id number] Date of Birth/Sex: Treating RN: 08-Dec-1980 (42 y.o. [name] Primary Care Provider: PA Zenovia Jordan, NO Other Clinician: Referring Provider: Treating Provider/Extender: [name], [name], Referral Weeks in Treatment: 8 Debridement Performed for Assessment: Wound #7 Right,Lateral Lower Leg Performed By: Physician Maxwell Caul, MD Debridement Type: Debridement Severity of Tissue Pre Debridement: Fat layer exposed Level of Consciousness (Pre-procedure): Awake and Alert Pre-procedure Verification/Time Out Yes - 10:02 Taken: Start Time: 10:02 Pain Control: Lidocaine 4% T opical Solution Percent of Wound Bed Debrided: 100% T Area Debrided (cm): otal 1.77 Tissue and other material debrided: Viable, Non-Viable, Slough, Subcutaneous, Slough Level: Skin/Subcutaneous Tissue Debridement Description: Excisional Instrument: Curette Bleeding: Moderate Hemostasis Achieved: Pressure Procedural Pain: 0 Post Procedural Pain: 0 Response to Treatment: Procedure was tolerated well Level of Consciousness (Post- Awake and Alert procedure): Post Debridement Measurements of Total Wound Length: (cm) 1.5 Width: (cm) 1.5 Depth: (cm) 0.2 Volume: (cm) 0.353 Character of Wound/Ulcer Post Debridement: Stable Severity of Tissue Post Debridement: Fat layer exposed Post Procedure Diagnosis Same as Pre-procedure Electronic Signature(s) Signed: 08/22/2023 3:47:50 PM By: Baltazar Najjar MD Signed: 08/24/2023 1:36:59 PM By: Midge Aver MSN RN CNS WTA Entered By: Baltazar Najjar on 08/22/2023 10:14:13 Soto, Shadawn [name] ([id number]) [id number] Page 2 of  9 -------------------------------------------------------------------------------- HPI Details Patient Name: Date of Service[facility]int [name] Laqueisha [name]. 08/22/2023 9:30 A M Medical Record Number: [id number] Patient Account Number: [id number] Date of Birth/Sex: Treating RN: 02-05-1981 (42 y.o. [name] Primary Care Provider: PA TIENT, NO Other Clinician: Referring Provider: Treating Provider/Extender: [name], [name], Referral Weeks in Treatment: 8 History of Present Illness HPI Description: 42 year old patient well known to our [facility] wound care clinic where she has been seen since 2016 for bilateral lower extremity venous insufficiency disease with lymphedema and multiple ulcerations associated with morbid obesity. she had custom-made compression stockings and lymphedema pumps which were used in the past. most recently she was admitted to the hospital between October 11 and 09/02/2017 with sepsis, lower extremity wounds and lymphedema.she was initially treated in the outpatient with Keflex and Bactrim. she was initially treated in the hospital with vancomycin and Zosyn and changed over to Unasyn until her white count improved and her blood cultures were negative for 3 days. After her inpatient management she was discharged home on Augmentin to end on 09/13/2017 with a 14 day course. she has had outpatient vascular duplex scans completed in November 2017 and her right ABI was 1.1 and the left ABI is 1.3. she had normal toe brachial indices bilaterally.she had three-vessel runoff in the right lower extremity and two-vessel runoff in the left lower extremity. On questioning the patient she does have custom made compression stockings and also has a lymphedema pump but has not been using it appropriately and has not been taking good care of herself. 09/17/2017 -- she returns today with compression stockings on the left side and the right side has had significant amount of drainage and  has a very strong odor 09/24/2017 -- the drainage is increased significantly and she has more lymphedema and a very strong odor to her wound. Though she does not have systemic symptoms, or overt infection I believe she will benefit from some doxycycline given empirically. 10/01/2017 -- after starting the doxycycline  and changing the dressing twice a week her symptoms and signs have definitely improved overall. 10/08/2017 -- she has completed her course of doxycycline but continues to have a lot of drainage and needs twice a week dressing changes. 11/08/17-she is here in follow-up evaluation for right lower extremity ulcers. She admits to using her lymphedema pumps twice daily, one hour per session. she is voicing no complaints or concerns, no signs of infection will change to [facility] 12/14/17 on evaluation today patient appears to be doing very well in regard to her wounds. She has been tolerating the dressing changes she continues to develop some portly the adherent granular tissue on the surface of the wound with some Slough. Obviously we are trying to get too much better wound bed. With that being said the hyper granulation the Hydrofera Blue Dressing to have helped with which is excellent news. However I think it may be time to try something a little bit different at this point. 01/11/18 on evaluation today patient appears to be doing fairly well in regard to her right lateral lower extremity ulcers. This shows excellent signs of filling in which is great news. There does not appear to be any evidence of infection which is also good news. She does continue to work as well is good school. She is having no pain. 01/22/18 on evaluation today patient appears to be doing a little bit worse in my opinion in regard to the overall quality of that granulation on her right lower extremity. She was not here last week due to being sick with a stomach virus this may have something to do with the fact that her  wound appears to be a little bit worse. With that being said I'm also thinking that after switching from the [facility] Dressing to the silver collagen would really has not looked that's good in my opinion. We may want to swit 02/11/18 on evaluation today patient's right lower/lateral lower extremity ulcers appear to be doing very well at this point. Especially the more proximal ulcer has filled in much closer to surface which is good news. Nonetheless both show signs of improvement which is great news. There does not appear to be any evidence of infection which is also good news. In general patient has been doing well tolerating the wraps as well as the [employer]. 02/18/18 on evaluation today patient appears to be doing a little bit worse in regard to the periwound region the wounds themselves do not look much deteriorated to me. With that being said she has several small blisters/pustules noted in the periwound and there was a significant amount of drainage and maceration compared to previous. There has been a time that we had to bring her back for twice a week dressing changes as far as her wrap was concerned it has been a while since we've done that however. With that being said the patient has been having some burning and in general I'm concerned about the possibility of infection. She has previously taken doxycycline with good result. Fortunately there does not appear to be any evidence of overall worsening in regard to the size of the wound and in fact the upper wound actually appears to be showing signs of good epithelialization. 03/18/18 on evaluation today patient appears to be doing excellent in regard to her right lateral lower extremity ulcer. She has been tolerating the dressing changes without complication. Fortunately this seems to be making great progress. Overall I see no signs of infection  and there is dramatic improvement overall even compared to last week. 03/28/18 on  evaluation today patient appears to be doing very well in regard to her right lateral lower surety ulcer. She has been tolerating the dressing changes without complication at this point. She states currently that she's having no significant discomfort which is excellent as well. Overall I'm pleased with how things seem to be progressing. 04/01/18 on evaluation today patient appears to be doing excellent in regard to her right lateral lower extremity ulcers. She has been tolerating the dressing changes without complication which is good news. With that being said the wraps still continues to show signs of helping with her fluid she does have Juxta-Lite compression stockings for when we are done with the current treatment regimen once everything heals. Mainly she just has the one area still remaining the smaller of the two wings is pretty much closed at this point there's just a very slight opening noted. [name], [name] ([id number]) [id number] Page 3 of 9 04/08/18 on evaluation today patient appears to be doing better in regard to the original wound on the right lateral lower extremity that we have been managing. Unfortunately she has a new area of weeping more anterior on the right lateral lower extremity and on the left lower extremity she has two new ulcers there appears to be some cellulitis noted at this time. I am concerned about the fact that this may in fact be an infection that has caused the worsening and swelling in the past this has been the case when we previously attempted to determine what was going on when she had down slides like this. With that being said the patient is seeming to tolerate the wraps fairly well for the most part. 04/17/18; since last time the patient was seen in this clinic she was hospitalized from 04/11/18 through 04/13/18; she presented with bilateral lower extremity pain worse on the right and a fever of up to 104. Noteworthy that when she was  in the clinic last week she had new wounds on the left leg culture grew MRSA and she was prescribed Bactrim. She had 2 days of IV Vanco and Zosyn in the hospital. Her blood cultures were negative. She was discharged on Bactrim to cover the original MRSA on the right leg and Keflex to cover the possibility of strep. The hospitalist had a conversation with infectious disease. The patient arrives in clinic today for nurse check however given the recent hospitalization I was asked to look at her. The patient states she feels a lot better. No fever or chills. Still some pain in the right calf but a lot better. She arrived in the hospital with a white count of 15.6, the next day was 10.8. Comprehensive metabolic panel was normal. She is still taking Keflex and Bactrim It would appear that she had a surgical IandD at the bedside of the right calf felt to have a underlying abscess. According to our intake nurse the wound has expanded quite bit on the right lateral calf. She has no open area on the left anterior and left posterior calf as described last time 04/23/18 on evaluation today patient actually appears to be doing much better than when I last saw her. This obviously has been a couple weeks ago and in the interim she was admitted to the hospital for IV antibiotic therapy due to cellulitis, discharge, and fortunately the substance which hadn't sued has completely resolved. Her swelling seems to be better in regard  to her lower extremities as well and the wounds that opened up as results of the infection seems to be showing signs of improvement. There is some Slough noted on the left lateral wound otherwise a lot of weeping noted of the right lateral leg. 04/29/18 on evaluation today patient appears to be doing excellent in regard to her bilateral lower extremity ulcers. She's been tolerating the dressing changes without complication there does not appear to be any evidence of infection at this time. Overall  I think she is made great improvement and seems to be showing signs of coming back around where she was prior to the cellulitis and sepsis episode. 05/06/18 on evaluation today patient appears to be doing better in regard to her bilateral ocean the ulcers. She's been tolerating the dressing changes without complication. Fortunately there does not appear to be any evidence of infection at this time. Her swelling is significantly down overall seems to be showing signs of good improvement as well. Fortunately I do believe that she is progressing nicely back to where she was prior to the cellulitis/sepsis issue. 05/13/18 on evaluation today patient seems to be showing signs of great improvement she's been tolerating the dressing changes without complication and overall there does not appear to be any evidence of infection. All of which is good news. The only issue she really have today is that some of the [facility] Dressing actually was stuck to the periwound of the right lateral lower extremity however this was able to be removed during the debridement without complication. 05/21/18 on evaluation today patient appears to be doing very well in regard to her bilateral lower extremity ulcers. She has been tolerating the compression wraps without complication. There does not appear to be any evidence of infection at this point which is good news as well. Overall I'm very pleased with the progress that has been made. She likewise is very happy. 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 05/28/18 on evaluation today patient actually appears to be doing excellent in regard to her bilateral lower [place] ulcers. She's made great progress since last week and overall I'm very pleased in this regard. She states that she's not have any discomfort either which is also good news there's definitely no  evidence of infection. 06/03/18 on evaluation today patient actually appears to be doing excellent in regard to her ulcerations in fact these areas on both lower extremities were almost completely healed. Unfortunately she has a situation going on her life right now she actually found her husband deceased last 10-06-2023. She states that she has been quite a bit upset since that time unfortunately this is obviously a very big blow to her. Nonetheless she is concerned about the funeral and having to wear certain shoes for this that she states she cannot fit in with the wraps. She wonders if she could potentially come back on 06-Oct-2023 to have her wraps changed and to switch into her Juxta-Lite compression at that point. I think that would be an okay thing that we could do for her. Nonetheless she fortunately seems to be tolerating the wraps very well and again has made excellent progress with the [facility] Dressing 06/11/18 on evaluation today patient appears to be doing rather well in regard to her bilateral lower extremities in fact everything appears to be completely healed at this point which is excellent news. There does  not appear to be any evidence of infection at this time which is great. In fact overall I do believe she is completely resolved in regard to her bilateral lower extremity ulcers. She is extremely happy in this regard. 06/27/2023 Ms. Girlie Veltri is a 42 year old female with a past medical history of venous insufficiency and lymphedema that presents to the clinic for a 3-week history of nonhealing wound to the right lower extremity. She states this started spontaneously. She does not wear compression stockings. She has been keeping the area covered. She currently denies signs of infection. 8/14; patient readmitted to clinic last week. She has venous insufficiency and chronic secondary lymphedema. She has a nonhealing wound on the right lateral lower leg. She has been using gent mupirocin  and Hydrofera Blue using an Urgo K2 lite wrap. Her ABI on the right leg was 0.74 on admission to clinic. Formal arterial studies were ordered but we have not yet had any contact with the patient. Also there was some question about juxta lites for this patient we have not heard anything about that either. 8/28; patient presents for follow-up. She had ABIs completed on 8/23 that showed noncompressible ABIs on the right and left with TBI of 0.88 on the left and 0.96 on the right. She had triphasic waveforms throughout the arterial system. This is reassuring that she has adequate blood flow for healing. We have been using antibiotic ointment with Hydrofera Blue under Urgo K2 lite wrap. She has tolerated this well. Wound is smaller. 9/4; patient presents for follow-up. We have been using Urgo 2 regular4-layer equivalent as well as Hydrofera Blue and antibiotic ointment. Wound is slightly smaller. She has no issues or complaints. She tolerated the wrap well. 9/11; patient presents for follow-up. We have been using Hydrofera Blue with antibiotic ointment under Urgo regular compression. She states the wrap slid down on Sunday and she has been keeping the area covered for the past 2 to 3 days. 9/18; small wound on the right lateral lower leg this is in the setting of significant lymphedema. We have you been using gent and mupirocin with Hydrofera Blue and Urgo K2 regular compression. The wound is about the same size this week. At 1 point she got wraparound stockings/external compression stockings during her time in our [facility]. I am not sure she is actually been wearing these in fact I think not 9/25; right lateral leg in the setting of very significant stage III lymphedema. We have been using gent and mupirocin under Hydrofera Blue and using an Urgo K2 lite. 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 She tells [place] a pipe burst in her apartment she got the wrap wet while cleaning up took it [id number] [place], Charolette [name] ([id number]) [id number] Page 4 of 9 Signed: 08/22/2023 3:47:50 PM By: Baltazar Najjar MD Entered By: Baltazar Najjar on 08/22/2023 10:10:55 -------------------------------------------------------------------------------- Physical Exam Details Patient Name: Date of Service: [name], Ieisha [name]. 08/22/2023 9:30 A M Medical Record Number: [id number] Patient Account Number: [id number] Date of Birth/Sex: Treating RN: [birth year] (42 y.o. [name] Primary Care Provider: PA Zenovia Jordan, NO Other Clinician: Referring Provider: Treating Provider/Extender: RO  BSO N, [name], Referral Weeks in Treatment: 8 Constitutional Sitting or standing Blood Pressure is within target range for patient.. Pulse regular and within target range for patient.Marland Kitchen Respirations regular, non-labored and within target range.. Temperature is normal and within the target range for the patient.Marland Kitchen appears in no distress. Notes Wound exam; her edema control is marginal. The small circular wound continues to need debridement with a #3 curette very difficult to get to a clean surface because of weeping lymphedema fluid. Electronic Signature(s) Signed: 08/22/2023 3:47:50 PM By: Baltazar Najjar MD Entered By: Baltazar Najjar on 08/22/2023 10:12:46 -------------------------------------------------------------------------------- Physician Orders Details Patient Name: Date of Service: [name], [name] 08/22/2023 9:30 A M Medical Record Number: [id number] Patient Account Number: [id number] Date of Birth/Sex: Treating RN: 12/04/1980 (42 y.o. [name] Primary Care Provider: PA Zenovia Jordan, NO Other Clinician: Referring Provider: Treating Provider/Extender: [name], [name], Referral Weeks in Treatment: 8 Verbal / Phone Orders: No Diagnosis Coding Follow-up Appointments Return Appointment in 1 week. ppointment in: - nurse visit (one time only) 8/30 Return A Bathing/ Shower/ Hygiene May shower with wound dressing protected with water repellent cover or cast protector. No tub bath. Anesthetic (Use 'Patient Medications' Section for Anesthetic Order Entry) Lidocaine applied to wound bed Meissner, Leilanie [name] ([id number]) [id number] Page 5 of 9 Edema Control - Lymphedema / Segmental Compressive Device / Other Optional: One layer of unna paste to top of compression wrap (to act as an anchor). UrgoK2 - Large Wound Treatment Wound #7 - Lower Leg Wound Laterality: Right, Lateral Cleanser: Soap and Water 1 x Per Week/30 Days Discharge Instructions: Gently cleanse wound with antibacterial soap, rinse and pat dry prior to dressing wounds Cleanser: Vashe 5.8 (oz) 1 x Per Week/30 Days Discharge Instructions: Use vashe 5.8 (oz) as directed Prim Dressing: IODOFLEX 0.9% Cadexomer Iodine Pad 1 x Per Week/30 Days ary Discharge Instructions: Apply Iodoflex to wound bed only as directed. Secondary Dressing: ABD Pad 5x9 (in/in) 1 x Per Week/30 Days Discharge Instructions: Cover with ABD pad Compression Wrap: Urgo K2, two layer compression system, large 1 x Per Week/30 Days Electronic Signature(s) Signed: 08/22/2023 3:47:50 PM By: Baltazar Najjar MD Signed: 08/24/2023 1:36:59 PM By: Midge Aver MSN RN CNS WTA Entered By: Midge Aver on 08/22/2023 10:05:09 -------------------------------------------------------------------------------- Problem List Details Patient Name: Date of Service: [name], Dasha [name]. 08/22/2023 9:30 A M Medical Record Number: [id number] Patient Account Number: [id number] Date of  Birth/Sex: Treating RN: April 30, 1981 (43 y.o. [name] Primary Care Provider: PA TIENT, NO Other Clinician: Referring Provider: Treating Provider/Extender: Chauncey Mann, [name], Referral Weeks in Treatment: 8 Active Problems ICD-10 Encounter Code Description Active Date MDM Diagnosis L97.812 Non-pressure chronic ulcer of other part of right lower leg with fat layer 06/27/2023 No Yes exposed I87.311 Chronic venous hypertension (idiopathic) with ulcer of right lower extremity 06/27/2023 No Yes I89.0 Lymphedema, not elsewhere classified 06/27/2023 No Yes Inactive Problems Resolved Problems Electronic Signature(s) Signed: 08/22/2023 3:47:50 PM By: Baltazar Najjar MD Man[place], Tolulope [name] 08/22/2023 3:47:50 PM By: Baltazar Najjar MD Signed: ([id number]) [id number] Page 6 of 9 Entered By: Baltazar Najjar on 08/22/2023 10:09:48 -------------------------------------------------------------------------------- Progress Note Details Patient Name: Date of Service: [name], [name] 08/22/2023 9:30 A M Medical Record Number: [id number] Patient Account Number: [id number] Date of Birth/Sex: Treating RN: 16-Aug-1981 (42 y.o. [name] Primary Care Provider: PA Zenovia Jordan, NO Other Clinician: Referring Provider: Treating Provider/Extender: [name], [name],  Referral Weeks in Treatment: 8 Subjective History of Present Illness (HPI) 42 year old patient well known to our [facility] wound care clinic where she has been seen since 2016 for bilateral lower extremity venous insufficiency disease with lymphedema and multiple ulcerations associated with morbid obesity. she had custom-made compression stockings and lymphedema pumps which were used in the past. most recently she was admitted to the hospital between October 11 and 09/02/2017 with sepsis, lower extremity wounds and lymphedema.she was initially treated in the outpatient with Keflex and Bactrim. she was initially  treated in the hospital with vancomycin and Zosyn and changed over to Unasyn until her white count improved and her blood cultures were negative for 3 days. After her inpatient management she was discharged home on Augmentin to end on 09/13/2017 with a 14 day course. she has had outpatient vascular duplex scans completed in November 2017 and her right ABI was 1.1 and the left ABI is 1.3. she had normal toe brachial indices bilaterally.she had three-vessel runoff in the right lower extremity and two-vessel runoff in the left lower extremity. On questioning the patient she does have custom made compression stockings and also has a lymphedema pump but has not been using it appropriately and has not been taking good care of herself. 09/17/2017 -- she returns today with compression stockings on the left side and the right side has had significant amount of drainage and has a very strong odor 09/24/2017 -- the drainage is increased significantly and she has more lymphedema and a very strong odor to her wound. Though she does not have systemic symptoms, or overt infection I believe she will benefit from some doxycycline given empirically. 10/01/2017 -- after starting the doxycycline and changing the dressing twice a week her symptoms and signs have definitely improved overall. 10/08/2017 -- she has completed her course of doxycycline but continues to have a lot of drainage and needs twice a week dressing changes. 11/08/17-she is here in follow-up evaluation for right lower extremity ulcers. She admits to using her lymphedema pumps twice daily, one hour per session. she is voicing no complaints or concerns, no signs of infection will change to [facility] 12/14/17 on evaluation today patient appears to be doing very well in regard to her wounds. She has been tolerating the dressing changes she continues to develop some portly the adherent granular tissue on the surface of the wound with some Slough. Obviously we  are trying to get too much better wound bed. With that being said the hyper granulation the Hydrofera Blue Dressing to have helped with which is excellent news. However I think it may be time to try something a little bit different at this point. 01/11/18 on evaluation today patient appears to be doing fairly well in regard to her right lateral lower extremity ulcers. This shows excellent signs of filling in which is great news. There does not appear to be any evidence of infection which is also good news. She does continue to work as well is good school. She is having no pain. 01/22/18 on evaluation today patient appears to be doing a little bit worse in my opinion in regard to the overall quality of that granulation on her right lower extremity. She was not here last week due to being sick with a stomach virus this may have something to do with the fact that her wound appears to be a little bit worse. With that being said I'm also thinking that after switching from the [facility] Dressing to the  silver collagen would really has not looked that's good in my opinion. We may want to swit 02/11/18 on evaluation today patient's right lower/lateral lower extremity ulcers appear to be doing very well at this point. Especially the more proximal ulcer has filled in much closer to surface which is good news. Nonetheless both show signs of improvement which is great news. There does not appear to be any evidence of infection which is also good news. In general patient has been doing well tolerating the wraps as well as the [employer]. 02/18/18 on evaluation today patient appears to be doing a little bit worse in regard to the periwound region the wounds themselves do not look much deteriorated to me. With that being said she has several small blisters/pustules noted in the periwound and there was a significant amount of drainage and maceration compared to previous. There has been a time that we had  to bring her back for twice a week dressing changes as far as her wrap was concerned it has been a while since we've done that however. With that being said the patient has been having some burning and in general I'm concerned about the possibility of infection. She has previously taken doxycycline with good result. Fortunately there does not appear to be any evidence of overall worsening in regard to the size of the wound and in fact the upper wound actually appears to be showing signs of good epithelialization. 03/18/18 on evaluation today patient appears to be doing excellent in regard to her right lateral lower extremity ulcer. She has been tolerating the dressing changes without complication. Fortunately this seems to be making great progress. Overall I see no signs of infection and there is dramatic improvement overall even compared to last week. 03/28/18 on evaluation today patient appears to be doing very well in regard to her right lateral lower surety ulcer. She has been tolerating the dressing changes without complication at this point. She states currently that she's having no significant discomfort which is excellent as well. Overall I'm pleased with how things seem to be progressing. ALAYSHA, JEFCOAT [name] ([id number]) [id number] Page 7 of 9 04/01/18 on evaluation today patient appears to be doing excellent in regard to her right lateral lower extremity ulcers. She has been tolerating the dressing changes without complication which is good news. With that being said the wraps still continues to show signs of helping with her fluid she does have Juxta-Lite compression stockings for when we are done with the current treatment regimen once everything heals. Mainly she just has the one area still remaining the smaller of the two wings is pretty much closed at this point there's just a very slight opening noted. 04/08/18 on evaluation today patient appears to be doing better in  regard to the original wound on the right lateral lower extremity that we have been managing. Unfortunately she has a new area of weeping more anterior on the right lateral lower extremity and on the left lower extremity she has two new ulcers there appears to be some cellulitis noted at this time. I am concerned about the fact that this may in fact be an infection that has caused the worsening and swelling in the past this has been the case when we previously attempted to determine what was going on when she had down slides like this. With that being said the patient is seeming to tolerate the wraps fairly well for the most part. 04/17/18; since last time the patient was  seen in this clinic she was hospitalized from 04/11/18 through 04/13/18; she presented with bilateral lower extremity pain worse on the right and a fever of up to 104. Noteworthy that when she was in the clinic last week she had new wounds on the left leg culture grew MRSA and she was prescribed Bactrim. She had 2 days of IV Vanco and Zosyn in the hospital. Her blood cultures were negative. She was discharged on Bactrim to cover the original MRSA on the right leg and Keflex to cover the possibility of strep. The hospitalist had a conversation with infectious disease. The patient arrives in clinic today for nurse check however given the recent hospitalization I was asked to look at her. The patient states she feels a lot better. No fever or chills. Still some pain in the right calf but a lot better. She arrived in the hospital with a white count of 15.6, the next day was 10.8. Comprehensive metabolic panel was normal. She is still taking Keflex and Bactrim It would appear that she had a surgical IandD at the bedside of the right calf felt to have a underlying abscess. According to our intake nurse the wound has expanded quite bit on the right lateral calf. She has no open area on the left anterior and left posterior calf as described last  time 04/23/18 on evaluation today patient actually appears to be doing much better than when I last saw her. This obviously has been a couple weeks ago and in the interim she was admitted to the hospital for IV antibiotic therapy due to cellulitis, discharge, and fortunately the substance which hadn't sued has completely resolved. Her swelling seems to be better in regard to her lower extremities as well and the wounds that opened up as results of the infection seems to be showing signs of improvement. There is some Slough noted on the left lateral wound otherwise a lot of weeping noted of the right lateral leg. 04/29/18 on evaluation today patient appears to be doing excellent in regard to her bilateral lower extremity ulcers. She's been tolerating the dressing changes without complication there does not appear to be any evidence of infection at this time. Overall I think she is made great improvement and seems to be showing signs of coming back around where she was prior to the cellulitis and sepsis episode. 05/06/18 on evaluation today patient appears to be doing better in regard to her bilateral ocean the ulcers. She's been tolerating the dressing changes without complication. Fortunately there does not appear to be any evidence of infection at this time. Her swelling is significantly down overall seems to be showing signs of good improvement as well. Fortunately I do believe that she is progressing nicely back to where she was prior to the cellulitis/sepsis issue. 05/13/18 on evaluation today patient seems to be showing signs of great improvement she's been tolerating the dressing changes without complication and overall there does not appear to be any evidence of infection. All of which is good news. The only issue she really have today is that some of the [facility]ressing actually was stuck to the periwound of the right lateral lower extremity however this was able to be removed during the  debridement without complication. 05/21/18 on evaluation today patient appears to be doing very well in regard to her bilateral lower extremity ulcers. She has been tolerating the compression wraps without complication. There does not appear to be any evidence of infection at this point which  is good news as well. Overall I'm very pleased with the progress that has been made. She likewise is very happy. 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 05/28/18 on evaluation today patient actually appears to be doing excellent in regard to her bilateral lower [place] ulcers. She's made great progress since last week and overall I'm very pleased in this regard. She states that she's not have any discomfort either which is also good news there's definitely no evidence of infection. 06/03/18 on evaluation today patient actually appears to be doing excellent in regard to her ulcerations in fact these areas on both lower extremities were almost completely healed. Unfortunately she has a situation going on her life right now she actually found her husband deceased last 2023-10-01. She states that she has been quite a bit upset since that time unfortunately this is obviously a very big blow to her. Nonetheless she is concerned about the funeral and having to wear certain shoes for this that she states she cannot fit in with the wraps. She wonders if she could potentially come back on 2023-10-01 to have her wraps changed and to switch into her Juxta-Lite compression at that point. I think that would be an okay thing that we could do for her. Nonetheless she fortunately seems to be tolerating the wraps very well and again has made excellent progress with the [facility] Dressing 06/11/18 on evaluation today patient appears to be doing rather well in regard to her bilateral lower extremities in fact everything  appears to be completely healed at this point which is excellent news. There does not appear to be any evidence of infection at this time which is great. In fact overall I do believe she is completely resolved in regard to her bilateral lower extremity ulcers. She is extremely happy in this regard. 06/27/2023 Ms. [name] is a 42 year old female with a past medical history of venous insufficiency and lymphedema that presents to the clinic for a 3-week history of nonhealing wound to the right lower extremity. She states this started spontaneously. She does not wear compression stockings. She has been keeping the area covered. She currently denies signs of infection. 8/14; patient readmitted to clinic last week. She has venous insufficiency and chronic secondary lymphedema. She has a nonhealing wound on the right lateral lower leg. She has been using gent mupirocin and Hydrofera Blue using an Urgo K2 lite wrap. Her ABI on the right leg was 0.74 on admission to clinic. Formal arterial studies were ordered but we have not yet had any contact with the patient. Also there was some question about juxta lites for this patient we have not heard anything about that either. 8/28; patient presents for follow-up. She had ABIs completed on 8/23 that showed noncompressible ABIs on the right and left with TBI of 0.88 on the left and 0.96 on the right. She had triphasic waveforms throughout the arterial system. This is reassuring that she has adequate blood flow for healing. We have been using antibiotic ointment with Hydrofera Blue under Urgo K2 lite wrap. She has tolerated this well. Wound is smaller. 9/4; patient presents for follow-up. We have been using Urgo 2 regular4-layer equivalent as well as Hydrofera Blue and antibiotic ointment. Wound is slightly smaller. She has no issues or complaints. She tolerated the wrap well. 9/11; patient presents for follow-up. We have been using Hydrofera Blue with antibiotic  ointment under Urgo regular compression. She states the wrap slid down on Sunday and she has been keeping the area covered for the past 2 to 3 days. 9/18; small wound on the right lateral lower leg this is in the setting of significant lymphedema. We have you been using gent and mupirocin with Hydrofera Blue and Urgo K2 regular compression. The wound is about the same size this week. At 1 point she got wraparound stockings/external compression stockings during her time in our [place] sister clinic. I am not sure she is actually been wearing these in fact I think not 9/25; right lateral leg in the setting of very significant stage III lymphedema. We have been using gent and mupirocin under Hydrofera Blue and using an Urgo K2 lite. She works in a daycare 10/2; right lateral leg in the setting of stage III lymphedema which is marginally controlled and spite of 4-layer equivalent compression with Harlene Salts we have Haberl, [name] ([id number]) [id number].pdf Page 8 of 9 been using gent and mupirocin with Hydrofera Blue, not making much progress however in the small circular wound with a very fibrinous surface. 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 She tells [place] a pipe burst in her apartment she got the wrap wet while cleaning up took it off Objective Constitutional Sitting or standing Blood Pressure is within target range for patient.. Pulse regular and within target range for patient.Marland Kitchen Respirations regular, non-labored and within target range.. Temperature is normal and within the target range for the patient.Marland Kitchen appears in no distress. Vitals Time Taken: 9:49 AM, Height: 73 in, Weight: 436 lbs, BMI: 57.5, Temperature: 97.8 F, Pulse: 87 bpm, Respiratory Rate: 18 breaths/min, Blood Pressure: 126/90 mmHg. General Notes: Wound exam; her edema control is marginal. The small circular wound continues to need debridement with a #3 curette very difficult to get to a clean surface  because of weeping lymphedema fluid. Integumentary (Hair, Skin) Wound #7 status is Open. Original cause of wound was Gradually Appeared. The date acquired was: 06/06/2023. The wound has been in treatment 8 weeks. The wound is located on the Right,Lateral Lower Leg. The wound measures 1.5cm length x 1.5cm width x 0.2cm depth; 1.767cm^2 area and 0.353cm^3 volume. There is Fat Layer (Subcutaneous Tissue) exposed. There is a none present amount of drainage noted. There is small (1-33%) red, pink granulation within the wound bed. There is a medium (34-66%) amount of necrotic tissue within the wound bed including Adherent Slough. 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 There was a Excisional Skin/Subcutaneous Tissue Debridement with a total area of 1.77 sq cm performed by Maxwell Caul, MD. With the following instrument(s): Curette to remove Viable and Non-Viable tissue/material. Material removed includes Subcutaneous Tissue and Slough and after achieving pain control using Lidocaine 4% T opical Solution. No specimens were taken. A time out was conducted at 10:02, prior to the start of the procedure. A Moderate amount of bleeding was controlled with Pressure. The procedure was tolerated well with a pain level of 0 throughout and a pain level of 0 following the procedure. Post Debridement Measurements: 1.5cm length x 1.5cm width x 0.2cm depth; 0.353cm^3 volume. Character of Wound/Ulcer Post  Debridement is stable. Severity of Tissue Post Debridement is: Fat layer exposed. Post procedure Diagnosis Wound #7: Same as  Pre-Procedure Pre-procedure diagnosis of Wound #7 is a Venous Leg Ulcer located on the Right,Lateral Lower Leg . There was a Four Layer Compression Therapy Procedure by Midge Aver, RN. Post procedure Diagnosis Wound #7: Same as Pre-Procedure Plan Follow-up Appointments: Return Appointment in 1 week. Return Appointment in: - nurse visit (one time only) 8/30 Bathing/ Shower/ Hygiene: May shower with wound dressing protected with water repellent cover or cast protector. No tub bath. Anesthetic (Use 'Patient Medications' Section for Anesthetic Order Entry): Lidocaine applied to wound bed Edema Control - Lymphedema / Segmental Compressive Device / Other: Optional: One layer of unna paste to top of compression wrap (to act as an anchor). UrgoK2 - Large WOUND #7: - Lower Leg Wound Laterality: Right, Lateral Cleanser: Soap and Water 1 x Per Week/30 Days Discharge Instructions: Gently cleanse wound with antibacterial soap, rinse and pat dry prior to dressing wounds Cleanser: Vashe 5.8 (oz) 1 x Per Week/30 Days [name], [name] ([id number]) [id number] Page 9 of 9 Discharge Instructions: Use vashe 5.8 (oz) as directed Prim Dressing: IODOFLEX 0.9% Cadexomer Iodine Pad 1 x Per Week/30 Days ary Discharge Instructions: Apply Iodoflex to wound bed only as directed. Secondary Dressing: ABD Pad 5x9 (in/in) 1 x Per Week/30 Days Discharge Instructions: Cover with ABD pad Com pression Wrap: Urgo K2, two layer compression system, large 1 x Per Week/30 Days 1. I have changed the primary dressing from [facility] and topical antibiotics to Iodoflex to help with ongoing debridement 2. She is already on 4 layer compression the control of the lymphedema is not complete all that good. Might need to consider a 4-layer Profore 3. She does not have compression pumps, does not have insurance 4. I emphasized keeping her leg elevated when she can, she is expressed  understanding Electronic Signature(s) Signed: 08/22/2023 3:47:50 PM By: Baltazar Najjar MD Entered By: Baltazar Najjar on 08/22/2023 10:13:49 -------------------------------------------------------------------------------- SuperBill Details Patient Name: Date of Service: [name], Lainy [name]. 08/22/2023 Medical Record Number: [id number] Patient Account Number: [id number] Date of Birth/Sex: Treating RN: 07/15/1981 (43 y.o. [name] Primary Care Provider: PA TIENT, NO Other Clinician: Referring Provider: Treating Provider/Extender: [name], [name], Referral Weeks in Treatment: 8 Diagnosis Coding ICD-10 Codes Code Description [phone] Non-pressure chronic ulcer of other part of right lower leg with fat layer exposed I87.311 Chronic venous hypertension (idiopathic) with ulcer of right lower extremity I89.0 Lymphedema, not elsewhere classified Facility Procedures : CPT4 Code: [id number] Description: 11042 - DEB SUBQ TISSUE 20 SQ CM/< ICD-10 Diagnosis Description L97.812 Non-pressure chronic ulcer of other part of right lower leg with fat layer exp Modifier: osed Quantity: 1 Physician Procedures : CPT4 Code Description Modifier [id number] 11042 - WC PHYS SUBQ TISS 20 SQ CM ICD-10 Diagnosis Description L97.812 Non-pressure chronic ulcer of other part of right lower leg with fat layer exposed Quantity: 1 Electronic Signature(s) Signed: 08/22/2023 3:47:50 PM By: Baltazar Najjar MD Entered By: Baltazar Najjar on 08/22/2023 10:14:00

## 2023-08-24 NOTE — Progress Notes (Signed)
DARLY, MASSI R ([id number]) [id number] Page 1 of 9 Visit Report for 08/22/2023 Arrival Information Details Patient Name: Date of Service: CECIL, BIXBY. 08/22/2023 9:30 A M Medical Record Number: [id number] Patient Account Number: [id number] Date of Birth/Sex: Treating RN: August 29, 1981 (42 y.o. [name] Primary Care Guillermo Nehring: PA Zenovia Jordan, NO Other Clinician: Referring Marcela Alatorre: Treating Azhane Eckart/Extender: [name], [name], Referral Weeks in Treatment: 8 Visit Information History Since Last Visit Added or deleted any medications: No Patient Arrived: Ambulatory Any new allergies or adverse reactions: No Arrival Time: 09:45 Has Dressing in Place as Prescribed: Yes Accompanied By: self Has Compression in Place as Prescribed: Yes Transfer Assistance: None Pain Present Now: No Patient Identification Verified: Yes Secondary Verification Process Completed: Yes Patient Requires Transmission-Based Precautions: No Patient Has Alerts: No Electronic Signature(s) Signed: 08/24/2023 1:36:59 PM By: Midge Aver MSN RN CNS WTA Entered By: Midge Aver on 08/22/2023 09:45:46 -------------------------------------------------------------------------------- Clinic Level of Care Assessment Details Patient Name: Date of Service: [facility], Lyndon R. 08/22/2023 9:30 A M Medical Record Number: [id number] Patient Account Number: [id number] Date of Birth/Sex: Treating RN: 05/02/1981 (42 y.o. [name] Primary Care Jamee Pacholski: PA Zenovia Jordan, NO Other Clinician: Referring Dvon Jiles: Treating Deronte Solis/Extender: [name], [name], Referral Weeks in Treatment: 8 Clinic Level of Care Assessment Items TOOL 1 Quantity Score []  - 0 Use when EandM and Procedure is performed on INITIAL visit ASSESSMENTS - Nursing Assessment / Reassessment []  - 0 General Physical Exam (combine w/ comprehensive assessment (listed just below) when performed on new pt. evals) []  -  0 Comprehensive Assessment (HX, ROS, Risk Assessments, Wounds Hx, etc.) ASSESSMENTS - Wound and Skin Assessment / Reassessment []  - 0 Dermatologic / Skin Assessment (not related to wound area) ASSESSMENTS - Ostomy and/or Continence Assessment and Care ANGELIKI, MATES R ([id number]) [id number] Page 2 of 9 []  - 0 Incontinence Assessment and Management []  - 0 Ostomy Care Assessment and Management (repouching, etc.) PROCESS - Coordination of Care []  - 0 Simple Patient / Family Education for ongoing care []  - 0 Complex (extensive) Patient / Family Education for ongoing care []  - 0 Staff obtains Chiropractor, Records, T Results / Process Orders est []  - 0 Staff telephones HHA, Nursing Homes / Clarify orders / etc []  - 0 Routine Transfer to another Facility (non-emergent condition) []  - 0 Routine Hospital Admission (non-emergent condition) []  - 0 New Admissions / Manufacturing engineer / Ordering NPWT Apligraf, etc. , []  - 0 Emergency Hospital Admission (emergent condition) PROCESS - Special Needs []  - 0 Pediatric / Minor Patient Management []  - 0 Isolation Patient Management []  - 0 Hearing / Language / Visual special needs []  - 0 Assessment of Community assistance (transportation, D/C planning, etc.) []  - 0 Additional assistance / Altered mentation []  - 0 Support Surface(s) Assessment (bed, cushion, seat, etc.) INTERVENTIONS - Miscellaneous []  - 0 External ear exam []  - 0 Patient Transfer (multiple staff / Nurse, adult / Similar devices) []  - 0 Simple Staple / Suture removal (25 or less) []  - 0 Complex Staple / Suture removal (26 or more) []  - 0 Hypo/Hyperglycemic Management (do not check if billed separately) []  - 0 Ankle / Brachial Index (ABI) - do not check if billed separately Has the patient been seen at the hospital within the last three years: Yes Total Score: 0 Level Of Care: ____ Electronic Signature(s) Signed: 08/24/2023 1:36:59 PM By:  Midge Aver MSN RN CNS WTA Entered By: Midge Aver on 08/22/2023 10:05:18 --------------------------------------------------------------------------------  Compression Therapy Details Patient Name: Date of Service: LAWRIE, TUNKS. 08/22/2023 9:30 A M Medical Record Number: [id number] Patient Account Number: [id number] Date of Birth/Sex: Treating RN: 1980/12/14 (42 y.o. [name] Primary Care Fujiko Picazo: PA Zenovia Jordan, NO Other Clinician: Referring Odella Appelhans: Treating Lakeitha Basques/Extender: [name], [name], Referral Weeks in Treatment: 8 Compression Therapy Performed for Wound Assessment: Wound #7 Right,Lateral Lower Leg Performed By: [name], RN Compression TypeLauna Flight Pleas Patricia ([id number]) [id number] Page 3 of 9 Post Procedure Diagnosis Same as Pre-procedure Electronic Signature(s) Signed: 08/24/2023 1:36:59 PM By: Midge Aver MSN RN CNS WTA Entered By: Midge Aver on 08/22/2023 10:04:03 -------------------------------------------------------------------------------- Encounter Discharge Information Details Patient Name: Date of Service: LEA TH, [place] R. 08/22/2023 9:30 A M Medical Record Number: [id number] Patient Account Number: [id number] Date of Birth/Sex: Treating RN: Apr 19, 1981 (42 y.o. [name] Primary Care Layson Bertsch: PA Zenovia Jordan, NO Other Clinician: Referring Iisha Soyars: Treating Nyal Schachter/Extender: [name], [name], Referral Weeks in Treatment: 8 Encounter Discharge Information Items Post Procedure Vitals Discharge Condition: Stable Temperature (F): 97.8 Ambulatory Status: Ambulatory Pulse (bpm): 87 Discharge Destination: Home Respiratory Rate (breaths/min): 18 Transportation: Private Auto Blood Pressure (mmHg): 126/90 Accompanied By: self Schedule Follow-up Appointment: No Clinical Summary of Care: Electronic Signature(s) Signed: 08/24/2023 1:36:59 PM By: Midge Aver MSN RN CNS WTA Entered By:  Midge Aver on 08/22/2023 10:08:11 -------------------------------------------------------------------------------- Lower Extremity Assessment Details Patient Name: Date of Service: LEA TH, Angelise R. 08/22/2023 9:30 A M Medical Record Number: [id number] Patient Account Number: [id number] Date of Birth/Sex: Treating RN: 1981/09/28 (42 y.o. [name] Primary Care Henreitta Spittler: PA Zenovia Jordan, NO Other Clinician: Referring Mikiah Demond: Treating Mykayla Brinton/Extender: [name], [name], Referral Weeks in Treatment: 8 Edema Assessment Assessed: [Left: No] [Right: Yes] [Left: Edema] [Right: :] Calf Left: RightCARISA, BACKHAUS R ([id number]) [id number] Page 4 of 9 Point of Measurement: 38 cm From Medial Instep 58 cm Ankle Left: Right: Point of Measurement: 13 cm From Medial Instep 40 cm Vascular Assessment Pulses: Dorsalis Pedis Palpable: [Right:No] Extremity colors, hair growth, and conditions: Extremity Color: [Right:Dusky] Hair Growth on Extremity: [Right:No] Temperature of Extremity: [Right:Warm] Capillary Refill: [Right:< 3 seconds] Dependent Rubor: [Right:No] Blanched when Elevated: [Right:No No] Toe Nail Assessment Left: Right: Thick: Yes Discolored: Yes Deformed: Yes Improper Length and Hygiene: Yes Electronic Signature(s) Signed: 08/24/2023 1:36:59 PM By: Midge Aver MSN RN CNS WTA Entered By: Midge Aver on 08/22/2023 09:57:47 -------------------------------------------------------------------------------- Multi Wound Chart Details Patient Name: Date of Service: LEA TH, [place] R. 08/22/2023 9:30 A M Medical Record Number: [id number] Patient Account Number: [id number] Date of Birth/Sex: Treating RN: [birth year] (42 y.o. [name] Primary Care Alyra Patty: PA TIENT, NO Other Clinician: Referring Brittne Kawasaki: Treating Bhavesh Vazquez/Extender: [name], [name], Referral Weeks in Treatment: 8 Vital Signs Height(in): 73 Pulse(bpm):  87 Weight(lbs): 436 Blood Pressure(mmHg): 126/90 Body Mass Index(BMI): 57.5 Temperature(F): 97.8 Respiratory Rate(breaths/min): 18 [7:Photos:] [N/A:N/A] Right, Lateral Lower Leg N/A N/A Wound Location: ARIDAY, BRINKER R ([id number]) [id number] Page 5 of 9 Gradually Appeared N/A N/A Wounding Event: Venous Leg Ulcer N/A N/A Primary Etiology: Lymphedema, Hypertension N/A N/A Comorbid History: 06/06/2023 N/A N/A Date Acquired: 8 N/A N/A Weeks of Treatment: Open N/A N/A Wound Status: No N/A N/A Wound Recurrence: 1.5x1.5x0.2 N/A N/A Measurements L x W x D (cm) 1.767 N/A N/A A (cm) : rea 0.353 N/A N/A Volume (cm) : 48.90% N/A N/A % Reduction in A rea: 66.00% N/A N/A % Reduction in  Volume: Full Thickness Without Exposed N/A N/A Classification: Support Structures None Present N/A N/A Exudate Amount: Small (1-33%) N/A N/A Granulation Amount: Red, Pink N/A N/A Granulation Quality: Medium (34-66%) [name]: Fat Layer (Subcutaneous Tissue): Yes N/A N/A Exposed Structures: Fascia: No Tendon: No Muscle: No Joint: No Bone: No None N/A N/A Epithelialization: Treatment Notes Electronic Signature(s) Signed: 08/24/2023 1:36:59 PM By: Midge Aver MSN RN CNS WTA Entered By: Midge Aver on 08/22/2023 09:59:27 -------------------------------------------------------------------------------- Multi-Disciplinary Care Plan Details Patient Name: Date of Service: [facility], Shaneya R. 08/22/2023 9:30 A M Medical Record Number: [id number] Patient Account Number: [id number] Date of Birth/Sex: Treating RN: 17-Nov-1981 (42 y.o. [name] Primary Care Kaylia Winborne: PA Zenovia Jordan, NO Other Clinician: Referring Zylen Wenig: Treating Cheryll Keisler/Extender: [name], [name], Referral Weeks in Treatment: 8 Active Inactive Venous Leg Ulcer Nursing Diagnoses: Actual venous Insuffiency (use after diagnosis is confirmed) Knowledge deficit related to disease  process and management Goals: Patient will maintain optimal edema control Date Initiated: 06/27/2023 Date Inactivated: 08/01/2023 Target Resolution Date: 07/28/2023 Goal Status: Met Patient/caregiver will verbalize understanding of disease process and disease management Date Initiated: 06/27/2023 Date Inactivated: 08/01/2023 Target Resolution Date: 07/28/2023 Goal Status: Met Verify adequate tissue perfusion prior to therapeutic compression application Date Initiated: 06/27/2023 Target Resolution Date: 09/27/2023 Goal Status: Active Interventions: Assess peripheral edema status every visit. MAIGEN, MOZINGO R ([id number]) [id number] Page 6 of 9 Compression as ordered Provide education on venous insufficiency Treatment Activities: Non-invasive vascular studies : 06/27/2023 T ordered outside of clinic : 06/27/2023 est Therapeutic compression applied : 06/27/2023 Notes: Wound/Skin Impairment Nursing Diagnoses: Impaired tissue integrity Knowledge deficit related to ulceration/compromised skin integrity Goals: Patient/caregiver will verbalize understanding of skin care regimen Date Initiated: 06/27/2023 Date Inactivated: 08/01/2023 Target Resolution Date: 07/28/2023 Goal Status: Met Ulcer/skin breakdown will have a volume reduction of 30% by week 4 Date Initiated: 06/27/2023 Date Inactivated: 08/01/2023 Target Resolution Date: 07/28/2023 Goal Status: Met Ulcer/skin breakdown will have a volume reduction of 50% by week 8 Date Initiated: 06/27/2023 Date Inactivated: 08/22/2023 Target Resolution Date: 08/27/2023 Goal Status: Met Ulcer/skin breakdown will have a volume reduction of 80% by week 12 Date Initiated: 06/27/2023 Target Resolution Date: 09/27/2023 Goal Status: Active Interventions: Assess patient/caregiver ability to obtain necessary supplies Assess patient/caregiver ability to perform ulcer/skin care regimen upon admission and as needed Assess ulceration(s) every  visit Provide education on ulcer and skin care Treatment Activities: Skin care regimen initiated : 06/27/2023 Notes: Electronic Signature(s) Signed: 08/24/2023 1:36:59 PM By: Midge Aver MSN RN CNS WTA Entered By: Midge Aver on 08/22/2023 10:06:23 -------------------------------------------------------------------------------- Pain Assessment Details Patient Name: Date of Service: Richrd Prime, Seher R. 08/22/2023 9:30 A M Medical Record Number: [id number] Patient Account Number: [id number] Date of Birth/Sex: Treating RN: 03-05-1981 (42 y.o. [name] Primary Care Valeree Leidy: PA Zenovia Jordan, NO Other Clinician: Referring Johari Pinney: Treating Khamauri Bauernfeind/Extender: [name], [name], Referral Weeks in Treatment: 8 Active Problems Location of Pain Severity and Description of Pain Patient Has Paino No Site Locations Reese, Shaquina R ([id number]) [id number] Page 7 of 9 Pain Management and Medication Current Pain Management: Electronic Signature(s) Signed: 08/24/2023 1:36:59 PM By: Midge Aver MSN RN CNS WTA Entered By: Midge Aver on 08/22/2023 09:52:54 -------------------------------------------------------------------------------- Patient/Caregiver Education Details Patient Name: Date of Service: Richrd Prime, Margeart R. 10/2/2024andnbsp9:30 A M Medical Record Number: [id number] Patient Account Number: [id number] Date of Birth/Gender: Treating RN: 11/09/1981 (42 y.o. [name] Primary Care Physician: PA Zenovia Jordan, NO Other Clinician: Referring Physician: Treating Physician/Extender:  [name], [name], Referral Weeks in Treatment: 8 Education Assessment Education Provided To: Patient Education Topics Provided Wound Debridement: Handouts: Wound Debridement Methods: Explain/Verbal Responses: State content correctly Wound/Skin Impairment: Handouts: Caring for Your Ulcer Methods: Explain/Verbal Responses: State content correctly Electronic  Signature(s) Signed: 08/24/2023 1:36:59 PM By: Midge Aver MSN RN CNS WTA Entered By: Midge Aver on 08/22/2023 10:06:41 Rabon, Tysha R ([id number]) [id number] Page 8 of 9 -------------------------------------------------------------------------------- Wound Assessment Details Patient Name: Date of Service: TINLEE, NAVARRETTE. 08/22/2023 9:30 A M Medical Record Number: [id number] Patient Account Number: [id number] Date of Birth/Sex: Treating RN: October 03, 1981 (42 y.o. [name] Primary Care Israella Hubert: PA TIENT, NO Other Clinician: Referring Oletha Tolson: Treating Zakariyya Helfman/Extender: [name], [name], Referral Weeks in Treatment: 8 Wound Status Wound Number: 7 Primary Etiology: Venous Leg Ulcer Wound Location: Right, Lateral Lower Leg Wound Status: Open Wounding Event: Gradually Appeared Comorbid History: Lymphedema, Hypertension Date Acquired: 06/06/2023 Weeks Of Treatment: 8 Clustered Wound: No Photos Wound Measurements Length: (cm) 1.5 Width: (cm) 1.5 Depth: (cm) 0.2 Area: (cm) 1.767 Volume: (cm) 0.353 % Reduction in Area: 48.9% % Reduction in Volume: 66% Epithelialization: None Wound Description Classification: Full Thickness Without Exposed Suppor Exudate Amount: None Present t Structures Foul Odor After Cleansing: No Slough/Fibrino Yes Wound Bed Granulation Amount: Small (1-33%) Exposed Structure Granulation Quality: Red, Pink Fascia Exposed: No Necrotic Amount: Medium (34-66%) Fat Layer (Subcutaneous Tissue) Exposed: Yes Necrotic Quality: Adherent Slough Tendon Exposed: No Muscle Exposed: No Joint Exposed: No Bone Exposed: No Treatment Notes Wound #7 (Lower Leg) Wound Laterality: Right, Lateral Cleanser Soap and Water Discharge Instruction: Gently cleanse wound with antibacterial soap, rinse and pat dry prior to dressing wounds Vashe 5.8 (oz) Discharge Instruction: Use vashe 5.8 (oz) as directed TWYLAH, BENNETTS R ([id number])  [id number] Page 9 of 9 Peri-Wound Care Topical Primary Dressing IODOFLEX 0.9% Cadexomer Iodine Pad Discharge Instruction: Apply Iodoflex to wound bed only as directed. Secondary Dressing ABD Pad 5x9 (in/in) Discharge Instruction: Cover with ABD pad Secured With Compression Wrap Urgo K2, two layer compression system, large Compression Stockings Add-Ons Electronic Signature(s) Signed: 08/24/2023 1:36:59 PM By: Midge Aver MSN RN CNS WTA Entered By: Midge Aver on 08/22/2023 09:57:00 -------------------------------------------------------------------------------- Vitals Details Patient Name: Date of Service: LEA TH, Dayleen R. 08/22/2023 9:30 A M Medical Record Number: [id number] Patient Account Number: [id number] Date of Birth/Sex: Treating RN: [birth year] (42 y.o. [name] Primary Care Analuisa Tudor: PA TIENT, NO Other Clinician: Referring Jarissa Sheriff: Treating Corinthian Kemler/Extender: [name], [name], Referral Weeks in Treatment: 8 Vital Signs Time Taken: 09:49 Temperature (F): 97.8 Height (in): 73 Pulse (bpm): 87 Weight (lbs): 436 Respiratory Rate (breaths/min): 18 Body Mass Index (BMI): 57.5 Blood Pressure (mmHg): 126/90 Reference Range: 80 - 120 mg / dl Electronic Signature(s) Signed: 08/24/2023 1:36:59 PM By: Midge Aver MSN RN CNS WTA Entered By: Midge Aver on 08/22/2023 09:52:38

## 2023-08-29 ENCOUNTER — Encounter: Payer: Self-pay | Admitting: Internal Medicine

## 2023-09-05 ENCOUNTER — Encounter: Payer: Self-pay | Admitting: Internal Medicine

## 2023-09-07 NOTE — Progress Notes (Signed)
[name] ([id number]) [id number] Page 1 of 9 Visit Report for 09/05/2023 HPI Details Patient Name: Date of Service: [name] 09/05/2023 10:00 A M Medical Record Number: [id number] Patient Account Number: [id number] Date of Birth/Sex: Treating RN: [birth year] (42 y.o. [name] Primary Care Provider: PA [name], NO Other Clinician: Betha [name] Referring Provider: Treating Provider/Extender: [name], [name], Referral Weeks in Treatment: 10 History of Present Illness HPI Description: 42 year old patient well known to our [facility] wound care clinic where she has been seen since 2016 for bilateral lower extremity venous insufficiency disease with lymphedema and multiple ulcerations associated with morbid obesity. she had custom-made compression stockings and lymphedema pumps which were used in the past. most recently she was admitted to the hospital between October 11 and 09/02/2017 with sepsis, lower extremity wounds and lymphedema.she was initially treated in the outpatient with Keflex and Bactrim. she was initially treated in the hospital with vancomycin and Zosyn and changed over to Unasyn until her white count improved and her blood cultures were negative for 3 days. After her inpatient management she was discharged home on Augmentin to end on 09/13/2017 with a 14 day course. she has had outpatient vascular duplex scans completed in November 2017 and her right ABI was 1.1 and the left ABI is 1.3. she had normal toe brachial indices bilaterally.she had three-vessel runoff in the right lower extremity and two-vessel runoff in the left lower extremity. On questioning the patient she does have custom made compression stockings and also has a lymphedema pump but has not been using it appropriately and has not been taking good care of herself. 09/17/2017 -- she returns today with compression stockings on the left side and the right side  has had significant amount of drainage and has a very strong odor 09/24/2017 -- the drainage is increased significantly and she has more lymphedema and a very strong odor to her wound. Though she does not have systemic symptoms, or overt infection I believe she will benefit from some doxycycline given empirically. 10/01/2017 -- after starting the doxycycline and changing the dressing twice a week her symptoms and signs have definitely improved overall. 10/08/2017 -- she has completed her course of doxycycline but continues to have a lot of drainage and needs twice a week dressing changes. 11/08/17-she is here in follow-up evaluation for right lower extremity ulcers. She admits to using her lymphedema pumps twice daily, one hour per session. she is voicing no complaints or concerns, no signs of infection will change to [facility] 12/14/17 on evaluation today patient appears to be doing very well in regard to her wounds. She has been tolerating the dressing changes she continues to develop some portly the adherent granular tissue on the surface of the wound with some Slough. Obviously we are trying to get too much better wound bed. With that being said the hyper granulation the Hydrofera Blue Dressing to have helped with which is excellent news. However I think it may be time to try something a little bit different at this point. 01/11/18 on evaluation today patient appears to be doing fairly well in regard to her right lateral lower extremity ulcers. This shows excellent signs of filling in which is great news. There does not appear to be any evidence of infection which is also good news. She does continue to work as well is good school. She is having no pain. 01/22/18 on evaluation today patient appears to be doing a little bit worse  in my opinion in regard to the overall quality of that granulation on her right lower extremity. She was not here last week due to being sick with a stomach virus this may have  something to do with the fact that her wound appears to be a little bit worse. With that being said I'm also thinking that after switching from the [facility] Dressing to the silver collagen would really has not looked that's good in my opinion. We may want to swit 02/11/18 on evaluation today patient's right lower/lateral lower extremity ulcers appear to be doing very well at this point. Especially the more proximal ulcer has filled in much closer to surface which is good news. Nonetheless both show signs of improvement which is great news. There does not appear to be any evidence of infection which is also good news. In general patient has been doing well tolerating the wraps as well as the [employer]. 02/18/18 on evaluation today patient appears to be doing a little bit worse in regard to the periwound region the wounds themselves do not look much deteriorated to me. With that being said she has several small blisters/pustules noted in the periwound and there was a significant amount of drainage and maceration compared to previous. There has been a time that we had to bring her back for twice a week dressing changes as far as her wrap was concerned it has been a while since we've done that however. With that being said the patient has been having some burning and in general I'm concerned about the possibility of infection. She has previously taken doxycycline with good result. Fortunately there does not appear to be any evidence of overall worsening in regard to the size of the wound and in fact the upper wound actually appears to be showing signs of good epithelialization. 03/18/18 on evaluation today patient appears to be doing excellent in regard to her right lateral lower extremity ulcer. She has been tolerating the dressing changes without complication. Fortunately this seems to be making great progress. Overall I see no signs of infection and there is dramatic improvement overall  even compared to last week. 03/28/18 on evaluation today patient appears to be doing very well in regard to her right lateral lower surety ulcer. She has been tolerating the dressing changes without complication at this point. She states currently that she's having no significant discomfort which is excellent as well. Overall I'm pleased with how things seem to be progressing. [name], [name] ([id number]) [id number] Page 2 of 9 04/01/18 on evaluation today patient appears to be doing excellent in regard to her right lateral lower extremity ulcers. She has been tolerating the dressing changes without complication which is good news. With that being said the wraps still continues to show signs of helping with her fluid she does have Juxta-Lite compression stockings for when we are done with the current treatment regimen once everything heals. Mainly she just has the one area still remaining the smaller of the two wings is pretty much closed at this point there's just a very slight opening noted. 04/08/18 on evaluation today patient appears to be doing better in regard to the original wound on the right lateral lower extremity that we have been managing. Unfortunately she has a new area of weeping more anterior on the right lateral lower extremity and on the left lower extremity she has two new ulcers there appears to be some cellulitis noted at this time. I am concerned about  the fact that this may in fact be an infection that has caused the worsening and swelling in the past this has been the case when we previously attempted to determine what was going on when she had down slides like this. With that being said the patient is seeming to tolerate the wraps fairly well for the most part. 04/17/18; since last time the patient was seen in this clinic she was hospitalized from 04/11/18 through 04/13/18; she presented with bilateral lower extremity pain worse on the right and a fever of  up to 104. Noteworthy that when she was in the clinic last week she had new wounds on the left leg culture grew MRSA and she was prescribed Bactrim. She had 2 days of IV Vanco and Zosyn in the hospital. Her blood cultures were negative. She was discharged on Bactrim to cover the original MRSA on the right leg and Keflex to cover the possibility of strep. The hospitalist had a conversation with infectious disease. The patient arrives in clinic today for nurse check however given the recent hospitalization I was asked to look at her. The patient states she feels a lot better. No fever or chills. Still some pain in the right calf but a lot better. She arrived in the hospital with a white count of 15.6, the next day was 10.8. Comprehensive metabolic panel was normal. She is still taking Keflex and Bactrim It would appear that she had a surgical IandD at the bedside of the right calf felt to have a underlying abscess. According to our intake nurse the wound has expanded quite bit on the right lateral calf. She has no open area on the left anterior and left posterior calf as described last time 04/23/18 on evaluation today patient actually appears to be doing much better than when I last saw her. This obviously has been a couple weeks ago and in the interim she was admitted to the hospital for IV antibiotic therapy due to cellulitis, discharge, and fortunately the substance which hadn't sued has completely resolved. Her swelling seems to be better in regard to her lower extremities as well and the wounds that opened up as results of the infection seems to be showing signs of improvement. There is some Slough noted on the left lateral wound otherwise a lot of weeping noted of the right lateral leg. 04/29/18 on evaluation today patient appears to be doing excellent in regard to her bilateral lower extremity ulcers. She's been tolerating the dressing changes without complication there does not appear to be any  evidence of infection at this time. Overall I think she is made great improvement and seems to be showing signs of coming back around where she was prior to the cellulitis and sepsis episode. 05/06/18 on evaluation today patient appears to be doing better in regard to her bilateral ocean the ulcers. She's been tolerating the dressing changes without complication. Fortunately there does not appear to be any evidence of infection at this time. Her swelling is significantly down overall seems to be showing signs of good improvement as well. Fortunately I do believe that she is progressing nicely back to where she was prior to the cellulitis/sepsis issue. 05/13/18 on evaluation today patient seems to be showing signs of great improvement she's been tolerating the dressing changes without complication and overall there does not appear to be any evidence of infection. All of which is good news. The only issue she really have today is that some of the [facility]  Dressing actually was stuck to the periwound of the right lateral lower extremity however this was able to be removed during the debridement without complication. 05/21/18 on evaluation today patient appears to be doing very well in regard to her bilateral lower extremity ulcers. She has been tolerating the compression wraps without complication. There does not appear to be any evidence of infection at this point which is good news as well. Overall I'm very pleased with the progress that has been made. She likewise is very happy. 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 05/28/18 on evaluation today patient actually appears to be doing excellent in regard to her bilateral lower [place] ulcers. She's made great progress since last week and overall I'm very pleased in this regard. She states that she's not have any discomfort either which is  also good news there's definitely no evidence of infection. 06/03/18 on evaluation today patient actually appears to be doing excellent in regard to her ulcerations in fact these areas on both lower extremities were almost completely healed. Unfortunately she has a situation going on her life right now she actually found her husband deceased last Oct 19, 2023. She states that she has been quite a bit upset since that time unfortunately this is obviously a very big blow to her. Nonetheless she is concerned about the funeral and having to wear certain shoes for this that she states she cannot fit in with the wraps. She wonders if she could potentially come back on Oct 19, 2023 to have her wraps changed and to switch into her Juxta-Lite compression at that point. I think that would be an okay thing that we could do for her. Nonetheless she fortunately seems to be tolerating the wraps very well and again has made excellent progress with the [facility] Dressing 06/11/18 on evaluation today patient appears to be doing rather well in regard to her bilateral lower extremities in fact everything appears to be completely healed at this point which is excellent news. There does not appear to be any evidence of infection at this time which is great. In fact overall I do believe she is completely resolved in regard to her bilateral lower extremity ulcers. She is extremely happy in this regard. 06/27/2023 [name] is a 42 year old female with a past medical history of venous insufficiency and lymphedema that presents to the clinic for a 3-week history of nonhealing wound to the right lower extremity. She states this started spontaneously. She does not wear compression stockings. She has been keeping the area covered. She currently denies signs of infection. 8/14; patient readmitted to clinic last week. She has venous insufficiency and chronic secondary lymphedema. She has a nonhealing wound on the right lateral lower  leg. She has been using gent mupirocin and Hydrofera Blue using an Urgo K2 lite wrap. Her ABI on the right leg was 0.74 on admission to clinic. Formal arterial studies were ordered but we have not yet had any contact with the patient. Also there was some question about juxta lites for this patient we have not heard anything about that either. 8/28; patient presents for follow-up. She had ABIs completed on 8/23 that showed noncompressible ABIs on the right and left with TBI of 0.88 on the left and 0.96 on the right. She had triphasic waveforms throughout the arterial system. This is reassuring that she has adequate blood flow for healing. We have been  using antibiotic ointment with Hydrofera Blue under Urgo K2 lite wrap. She has tolerated this well. Wound is smaller. 9/4; patient presents for follow-up. We have been using Urgo 2 regular4-layer equivalent as well as Hydrofera Blue and antibiotic ointment. Wound is slightly smaller. She has no issues or complaints. She tolerated the wrap well. 9/11; patient presents for follow-up. We have been using Hydrofera Blue with antibiotic ointment under Urgo regular compression. She states the wrap slid down on Sunday and she has been keeping the area covered for the past 2 to 3 days. 9/18; small wound on the right lateral lower leg this is in the setting of significant lymphedema. We have you been using gent and mupirocin with Hydrofera Blue and Urgo K2 regular compression. The wound is about the same size this week. At 1 point she got wraparound stockings/external compression stockings during her time in our [place] [facility]. I am not sure she is actually been wearing these in fact I think not 9/25; right lateral leg in the setting of very significant stage III lymphedema. We have been using gent and mupirocin under Hydrofera Blue and using an Urgo K2 lite. She works in a daycare 10/2; right lateral leg in the setting of stage III lymphedema which is  marginally controlled and spite of 4-layer equivalent compression with Harlene Salts we have [name], [name] ([id number]) [phone].pdf Page 3 of 9 been using gent and mupirocin with Hydrofera Blue, not making much progress however in the small circular wound with a very fibrinous surface. 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 She tells [place] a pipe burst in her apartment she got the wrap wet while cleaning up took it off 10/9; right lateral lower leg in the setting of stage III lymphedema. Small punched-out area but with a continuously nonviable surface. I changed to her to Iodoflex last week to help with the surface although she still comes in with a lot of fibrinous debris 10/16; right lateral lower leg wound in the setting of stage III lymphedema significant hemosiderin deposition but no obvious erythema. She arrives in clinic today with a much better looking wound surface. I started her on Iodoflex 2 weeks ago and did an aggressive debridement last week. The dimensions are slightly slightly larger today probably because of the debridement, however I think the surface of the wound is quite a bit better today. Electronic Signature(s) Signed: 09/05/2023 4:42:07 PM By: Baltazar Najjar MD Entered By: Baltazar Najjar on 09/05/2023 10:42:00 -------------------------------------------------------------------------------- Physical Exam Details Patient Name: Date of Service: [name], [name] 09/05/2023 10:00 A M Medical Record Number: [id number] Patient Account Number: [id number] Date of Birth/Sex: Treating RN: April 28, 1981 (42 y.o. [name] Primary Care Provider: PA [name], NO Other Clinician: Betha [name] Referring Provider: Treating Provider/Extender: [name], [name], Referral Weeks in Treatment: 10 Cardiovascular Pedal pulses are palpable. This is even through the lymphedema fluid. Modest edema control.. Notes Wound exam; right lateral lower leg  punched-out wound. Much better wound surface today. I think this was because of an aggressive debridement last week and the Iodoflex Electronic Signature(s) Signed: 09/05/2023 4:42:07 PM By: Baltazar Najjar MD Entered By: Baltazar Najjar on 09/05/2023 10:44:35 -------------------------------------------------------------------------------- Physician Orders Details Patient Name: Date of Service: [name], Rema [name]. 09/05/2023 10:00 A M Medical Record Number: [id number] Patient Account Number: [id number] Date of Birth/Sex: Treating RN: 1981/11/14 (42 y.o. [name] Primary Care Provider: PA [name], [place] Other Clinician: Betha [name] Referring Provider: Treating  Provider/Extender: [name], [name], Referral Weeks in Treatment: 10 Verbal / Phone Orders: Yes Clinician: Yevonne Pax Read Back and Verified: Yes Diagnosis Coding Follow-up Appointments ZERAH, MAHESHWARI [name] ([id number]) [id number].pdf Page 4 of 9 Return Appointment in 1 week. Nurse Visit as needed Bathing/ Shower/ Hygiene May shower with wound dressing protected with water repellent cover or cast protector. No tub bath. Anesthetic (Use 'Patient Medications' Section for Anesthetic Order Entry) Lidocaine applied to wound bed Edema Control - Lymphedema / Segmental Compressive Device / Other Optional: One layer of unna paste to top of compression wrap (to act as an anchor). UrgoK2 - Large Wound Treatment Wound #7 - Lower Leg Wound Laterality: Right, Lateral Cleanser: Soap and Water 1 x Per Week/30 Days Discharge Instructions: Gently cleanse wound with antibacterial soap, rinse and pat dry prior to dressing wounds Cleanser: Vashe 5.8 (oz) 1 x Per Week/30 Days Discharge Instructions: Use vashe 5.8 (oz) as directed Prim Dressing: IODOFLEX 0.9% Cadexomer Iodine Pad 1 x Per Week/30 Days ary Discharge Instructions: Apply Iodoflex to wound bed only as directed. Secondary Dressing: ABD Pad 5x9  (in/in) 1 x Per Week/30 Days Discharge Instructions: Cover with ABD pad Compression Wrap: Urgo K2, two layer compression system, large 1 x Per Week/30 Days Electronic Signature(s) Signed: 09/05/2023 4:42:07 PM By: Baltazar Najjar MD Signed: 09/06/2023 5:20:54 PM By: [name] Entered By: [name] on 09/05/2023 10:25:33 -------------------------------------------------------------------------------- Problem List Details Patient Name: Date of Service: [name], [place] [name]. 09/05/2023 10:00 A M Medical Record Number: [id number] Patient Account Number: [id number] Date of Birth/Sex: Treating RN: [birth year] (42 y.o. [name] Primary Care Provider: PA TIENT, NO Other Clinician: Betha [name] Referring Provider: Treating Provider/Extender: Chauncey Mann, [name], Referral Weeks in Treatment: 10 Active Problems ICD-10 Encounter Code Description Active Date MDM Diagnosis L97.812 Non-pressure chronic ulcer of other part of right lower leg with fat layer 06/27/2023 No Yes exposed I87.311 Chronic venous hypertension (idiopathic) with ulcer of right lower extremity 06/27/2023 No Yes I89.0 Lymphedema, not elsewhere classified 06/27/2023 No Yes Pudwill, [name] ([id number]) [id number] Page 5 of 9 Inactive Problems Resolved Problems Electronic Signature(s) Signed: 09/05/2023 4:42:07 PM By: Baltazar Najjar MD Entered By: Baltazar Najjar on 09/05/2023 10:40:54 -------------------------------------------------------------------------------- Progress Note Details Patient Name: Date of Service: [name], [place] [name]. 09/05/2023 10:00 A M Medical Record Number: [id number] Patient Account Number: [id number] Date of Birth/Sex: Treating RN: 05-31-1981 (42 y.o. [name] Primary Care Provider: PA TIENT, NO Other Clinician: Betha [name] Referring Provider: Treating Provider/Extender: [name], [name], Referral Weeks in Treatment: 10 Subjective History  of Present Illness (HPI) 42 year old patient well known to our [facility] wound care clinic where she has been seen since 2016 for bilateral lower extremity venous insufficiency disease with lymphedema and multiple ulcerations associated with morbid obesity. she had custom-made compression stockings and lymphedema pumps which were used in the past. most recently she was admitted to the hospital between October 11 and 09/02/2017 with sepsis, lower extremity wounds and lymphedema.she was initially treated in the outpatient with Keflex and Bactrim. she was initially treated in the hospital with vancomycin and Zosyn and changed over to Unasyn until her white count improved and her blood cultures were negative for 3 days. After her inpatient management she was discharged home on Augmentin to end on 09/13/2017 with a 14 day course. she has had outpatient vascular duplex scans completed in November 2017 and her right ABI was 1.1 and the left  ABI is 1.3. she had normal toe brachial indices bilaterally.she had three-vessel runoff in the right lower extremity and two-vessel runoff in the left lower extremity. On questioning the patient she does have custom made compression stockings and also has a lymphedema pump but has not been using it appropriately and has not been taking good care of herself. 09/17/2017 -- she returns today with compression stockings on the left side and the right side has had significant amount of drainage and has a very strong odor 09/24/2017 -- the drainage is increased significantly and she has more lymphedema and a very strong odor to her wound. Though she does not have systemic symptoms, or overt infection I believe she will benefit from some doxycycline given empirically. 10/01/2017 -- after starting the doxycycline and changing the dressing twice a week her symptoms and signs have definitely improved overall. 10/08/2017 -- she has completed her course of doxycycline but continues  to have a lot of drainage and needs twice a week dressing changes. 11/08/17-she is here in follow-up evaluation for right lower extremity ulcers. She admits to using her lymphedema pumps twice daily, one hour per session. she is voicing no complaints or concerns, no signs of infection will change to [facility] 12/14/17 on evaluation today patient appears to be doing very well in regard to her wounds. She has been tolerating the dressing changes she continues to develop some portly the adherent granular tissue on the surface of the wound with some Slough. Obviously we are trying to get too much better wound bed. With that being said the hyper granulation the Hydrofera Blue Dressing to have helped with which is excellent news. However I think it may be time to try something a little bit different at this point. 01/11/18 on evaluation today patient appears to be doing fairly well in regard to her right lateral lower extremity ulcers. This shows excellent signs of filling in which is great news. There does not appear to be any evidence of infection which is also good news. She does continue to work as well is good school. She is having no pain. 01/22/18 on evaluation today patient appears to be doing a little bit worse in my opinion in regard to the overall quality of that granulation on her right lower extremity. She was not here last week due to being sick with a stomach virus this may have something to do with the fact that her wound appears to be a little bit worse. With that being said I'm also thinking that after switching from the [facility] Dressing to the silver collagen would really has not looked that's good in my opinion. We may want to swit 02/11/18 on evaluation today patient's right lower/lateral lower extremity ulcers appear to be doing very well at this point. Especially the more proximal ulcer has filled in much closer to surface which is good news. Nonetheless both show signs of improvement  which is great news. There does not appear to be any evidence of infection which is also good news. In general patient has been doing well tolerating the wraps as well as the [employer]. 02/18/18 on evaluation today patient appears to be doing a little bit worse in regard to the periwound region the wounds themselves do not look much deteriorated to me. With that being said she has several small blisters/pustules noted in the periwound and there was a significant amount of drainage and maceration compared to previous. There has been a time that we had  to bring her back for twice a week dressing changes as far as her wrap was concerned it has been a while since we've done that however. With that being said the patient has been having some burning and in general I'm concerned about the possibility of Jowers, Advika [name] ([id number]) [id number].pdf Page 6 of 9 infection. She has previously taken doxycycline with good result. Fortunately there does not appear to be any evidence of overall worsening in regard to the size of the wound and in fact the upper wound actually appears to be showing signs of good epithelialization. 03/18/18 on evaluation today patient appears to be doing excellent in regard to her right lateral lower extremity ulcer. She has been tolerating the dressing changes without complication. Fortunately this seems to be making great progress. Overall I see no signs of infection and there is dramatic improvement overall even compared to last week. 03/28/18 on evaluation today patient appears to be doing very well in regard to her right lateral lower surety ulcer. She has been tolerating the dressing changes without complication at this point. She states currently that she's having no significant discomfort which is excellent as well. Overall I'm pleased with how things seem to be progressing. 04/01/18 on evaluation today patient appears to be doing excellent in  regard to her right lateral lower extremity ulcers. She has been tolerating the dressing changes without complication which is good news. With that being said the wraps still continues to show signs of helping with her fluid she does have Juxta-Lite compression stockings for when we are done with the current treatment regimen once everything heals. Mainly she just has the one area still remaining the smaller of the two wings is pretty much closed at this point there's just a very slight opening noted. 04/08/18 on evaluation today patient appears to be doing better in regard to the original wound on the right lateral lower extremity that we have been managing. Unfortunately she has a new area of weeping more anterior on the right lateral lower extremity and on the left lower extremity she has two new ulcers there appears to be some cellulitis noted at this time. I am concerned about the fact that this may in fact be an infection that has caused the worsening and swelling in the past this has been the case when we previously attempted to determine what was going on when she had down slides like this. With that being said the patient is seeming to tolerate the wraps fairly well for the most part. 04/17/18; since last time the patient was seen in this clinic she was hospitalized from 04/11/18 through 04/13/18; she presented with bilateral lower extremity pain worse on the right and a fever of up to 104. Noteworthy that when she was in the clinic last week she had new wounds on the left leg culture grew MRSA and she was prescribed Bactrim. She had 2 days of IV Vanco and Zosyn in the hospital. Her blood cultures were negative. She was discharged on Bactrim to cover the original MRSA on the right leg and Keflex to cover the possibility of strep. The hospitalist had a conversation with infectious disease. The patient arrives in clinic today for nurse check however given the recent hospitalization I was asked to  look at her. The patient states she feels a lot better. No fever or chills. Still some pain in the right calf but a lot better. She arrived in the hospital with a white count of 15.6,  the next day was 10.8. Comprehensive metabolic panel was normal. She is still taking Keflex and Bactrim It would appear that she had a surgical IandD at the bedside of the right calf felt to have a underlying abscess. According to our intake nurse the wound has expanded quite bit on the right lateral calf. She has no open area on the left anterior and left posterior calf as described last time 04/23/18 on evaluation today patient actually appears to be doing much better than when I last saw her. This obviously has been a couple weeks ago and in the interim she was admitted to the hospital for IV antibiotic therapy due to cellulitis, discharge, and fortunately the substance which hadn't sued has completely resolved. Her swelling seems to be better in regard to her lower extremities as well and the wounds that opened up as results of the infection seems to be showing signs of improvement. There is some Slough noted on the left lateral wound otherwise a lot of weeping noted of the right lateral leg. 04/29/18 on evaluation today patient appears to be doing excellent in regard to her bilateral lower extremity ulcers. She's been tolerating the dressing changes without complication there does not appear to be any evidence of infection at this time. Overall I think she is made great improvement and seems to be showing signs of coming back around where she was prior to the cellulitis and sepsis episode. 05/06/18 on evaluation today patient appears to be doing better in regard to her bilateral ocean the ulcers. She's been tolerating the dressing changes without complication. Fortunately there does not appear to be any evidence of infection at this time. Her swelling is significantly down overall seems to be showing signs of good  improvement as well. Fortunately I do believe that she is progressing nicely back to where she was prior to the cellulitis/sepsis issue. 05/13/18 on evaluation today patient seems to be showing signs of great improvement she's been tolerating the dressing changes without complication and overall there does not appear to be any evidence of infection. All of which is good news. The only issue she really have today is that some of the [facility] Dressing actually was stuck to the periwound of the right lateral lower extremity however this was able to be removed during the debridement without complication. 05/21/18 on evaluation today patient appears to be doing very well in regard to her bilateral lower extremity ulcers. She has been tolerating the compression wraps without complication. There does not appear to be any evidence of infection at this point which is good news as well. Overall I'm very pleased with the progress that has been made. She likewise is very happy. 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 05/28/18 on evaluation today patient actually appears to be doing excellent in regard to her bilateral lower [place] ulcers. She's made great progress since last week and overall I'm very pleased in this regard. She states that she's not have any discomfort either which is also good news there's definitely no evidence of infection. 06/03/18 on evaluation today patient actually appears to be doing excellent in regard to her ulcerations in fact these areas on both lower extremities were almost completely healed. Unfortunately she has a situation going on her life right now she actually found her husband deceased last 09-26-23. She states that she has been quite a bit upset since  that time unfortunately this is obviously a very big blow to her. Nonetheless she is concerned about the funeral  and having to wear certain shoes for this that she states she cannot fit in with the wraps. She wonders if she could potentially come back on Friday to have her wraps changed and to switch into her Juxta-Lite compression at that point. I think that would be an okay thing that we could do for her. Nonetheless she fortunately seems to be tolerating the wraps very well and again has made excellent progress with the [facility] Dressing 06/11/18 on evaluation today patient appears to be doing rather well in regard to her bilateral lower extremities in fact everything appears to be completely healed at this point which is excellent news. There does not appear to be any evidence of infection at this time which is great. In fact overall I do believe she is completely resolved in regard to her bilateral lower extremity ulcers. She is extremely happy in this regard. 06/27/2023 Ms. [name] is a 42 year old female with a past medical history of venous insufficiency and lymphedema that presents to the clinic for a 3-week history of nonhealing wound to the right lower extremity. She states this started spontaneously. She does not wear compression stockings. She has been keeping the area covered. She currently denies signs of infection. 8/14; patient readmitted to clinic last week. She has venous insufficiency and chronic secondary lymphedema. She has a nonhealing wound on the right lateral lower leg. She has been using gent mupirocin and Hydrofera Blue using an Urgo K2 lite wrap. Her ABI on the right leg was 0.74 on admission to clinic. Formal arterial studies were ordered but we have not yet had any contact with the patient. Also there was some question about juxta lites for this patient we have not heard anything about that either. 8/28; patient presents for follow-up. She had ABIs completed on 8/23 that showed noncompressible ABIs on the right and left with TBI of 0.88 on the left and 0.96 on the right.  She had triphasic waveforms throughout the arterial system. This is reassuring that she has adequate blood flow for healing. We have been using antibiotic ointment with Hydrofera Blue under Urgo K2 lite wrap. She has tolerated this well. Wound is smaller. 9/4; patient presents for follow-up. We have been using Urgo 2 regular4-layer equivalent as well as Hydrofera Blue and antibiotic ointment. Wound is slightly smaller. She has no issues or complaints. She tolerated the wrap well. 9/11; patient presents for follow-up. We have been using Hydrofera Blue with antibiotic ointment under Urgo regular compression. She states the wrap slid down on Sunday and she has been keeping the area covered for the past 2 to 3 days. [name], [name] ([id number]) [id number] Page 7 of 9 9/18; small wound on the right lateral lower leg this is in the setting of significant lymphedema. We have you been using gent and mupirocin with Hydrofera Blue and Urgo K2 regular compression. The wound is about the same size this week. At 1 point she got wraparound stockings/external compression stockings during her time in our [facility]. I am not sure she is actually been wearing these in fact I think not 9/25; right lateral leg in the setting of very significant stage III lymphedema. We have been using gent and mupirocin under Hydrofera Blue and using an Urgo K2 lite. 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 She tells [place] a pipe burst in her apartment she got the wrap wet while cleaning up took it off 10/9; right lateral lower leg in the setting of stage III lymphedema. Small  punched-out area but with a continuously nonviable surface. I changed to her to Iodoflex last week to help with the surface although she still comes in with a lot of fibrinous debris 10/16; right lateral lower leg wound in the setting of stage III lymphedema significant hemosiderin deposition but no obvious erythema. She arrives in clinic today with a much better looking wound surface. I started her on Iodoflex 2 weeks ago and did an aggressive debridement last week. The dimensions are slightly slightly larger today probably because of the debridement, however I think the surface of the wound is quite a bit better today. Objective Constitutional Vitals Time Taken: 10:07 AM, Height: 73 in, Weight: 436 lbs, BMI: 57.5, Temperature: 97.7 F, Pulse: 90 bpm, Respiratory Rate: 18 breaths/min, Blood Pressure: 132/80 mmHg. Cardiovascular Pedal pulses are palpable. This is even through the lymphedema fluid. Modest edema control.. General Notes: Wound exam; right lateral lower leg punched-out wound. Much better wound surface today. I think this was because of an aggressive debridement last week and the Iodoflex Integumentary (Hair, Skin) Wound #7 status is Open. Original cause of wound was Gradually Appeared. The date acquired was: 06/06/2023. The wound has been in treatment 10 weeks. The wound is located on the Right,Lateral Lower Leg. The wound measures 2cm length x 2.5cm width x 0.4cm depth; 3.927cm^2 area and 1.571cm^3 volume. There is Fat Layer (Subcutaneous Tissue) exposed. There is a none present amount of drainage noted. There is small (1-33%) red, pink granulation within the wound bed. There is a medium (34-66%) amount of necrotic tissue within the wound bed including Adherent Slough. 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 There was a Double Layer Compression Therapy Procedure with a pre-treatment ABI of 0.7 by [name] Post procedure Diagnosis Wound #7: Same as Pre-Procedure Plan Follow-up Appointments: Return Appointment in 1 week. Nurse Visit as needed Bathing/ Shower/ Hygiene: May shower with wound dressing protected with water repellent cover or cast protector. No tub bath. Anesthetic (Use 'Patient Medications' Section for Anesthetic Order Entry): LETIA, TRUMM [name] ([id number]) [id number] Page 8 of 9 Lidocaine applied to wound bed Edema Control - Lymphedema / Segmental Compressive Device / Other: Optional: One layer of unna paste to top of compression wrap (to act as an anchor). UrgoK2 - Large WOUND #7: - Lower Leg Wound Laterality: Right, Lateral Cleanser: Soap and Water 1 x Per Week/30 Days Discharge Instructions: Gently cleanse wound with antibacterial soap, rinse and pat dry prior to dressing wounds Cleanser: Vashe 5.8 (oz) 1 x Per Week/30 Days Discharge Instructions: Use vashe 5.8 (oz) as directed Prim Dressing: IODOFLEX 0.9% Cadexomer Iodine Pad 1 x Per Week/30 Days ary Discharge Instructions: Apply Iodoflex to wound bed only as directed. Secondary Dressing: ABD Pad 5x9 (in/in) 1 x Per  Week/30 Days Discharge Instructions: Cover with ABD pad Com pression Wrap: Urgo K2, two layer compression system, large 1 x Per Week/30 Days 1. Still the Iodoflex under Urgo K2 compression today 2. May be ready to try and change to something that we will stimulate granulation next week 3. I thought she had said she had Medicaid insurance now but today she just talked about something called Jari Favre. In any case this expired on 9/30. I was hoping to be able to order her compression pumps 4. She also does not have compression stockings.  Certainly before we discharge her from this clinic were going to have to explore this as well Electronic Signature(s) Signed: 09/05/2023 4:42:07 PM By: Baltazar Najjar MD Entered By: Baltazar Najjar on 09/05/2023 10:45:57 -------------------------------------------------------------------------------- SuperBill Details Patient Name: Date of Service: [name], Angellina [name]. 09/05/2023 Medical Record Number: [id number] Patient Account Number: [id number] Date of Birth/Sex: Treating RN: 04-01-1981 (42 y.o. [name] Primary Care Provider: PA TIENT, NO Other Clinician: Betha [name] Referring Provider: Treating Provider/Extender: [name], [name], Referral Weeks in Treatment: 10 Diagnosis Coding ICD-10 Codes Code Description [phone] Non-pressure chronic ulcer of other part of right lower leg with fat layer exposed I87.311 Chronic venous hypertension (idiopathic) with ulcer of right lower extremity I89.0 Lymphedema, not elsewhere classified Facility Procedures : CPT4 Code: [id number] Description: (Facility Use Only) [phone] - APPLY MULTLAY COMPRS LWR RT LEG Modifier: Quantity: 1 Physician Procedures : CPT4 Code Description Modifier [id number] - WC PHYS LEVEL 3 - EST PT ICD-10 Diagnosis Description L97.812 Non-pressure chronic ulcer of other part of right lower leg with fat layer exposed I87.311 Chronic venous hypertension (idiopathic) with ulcer  of right lower extremity I89.0 Lymphedema, not elsewhere classified Quantity: 1 Electronic Signature(s) Signed: 09/05/2023 4:42:07 PM By: Baltazar Najjar MD Mealey, Lakesa [name] 09/05/2023 4:42:07 PM By: Baltazar Najjar MD Signed: ([id number]) [id number] Page 9 of 9 Entered By: Baltazar Najjar on 09/05/2023 10:46:20

## 2023-09-07 NOTE — Progress Notes (Signed)
[name] [name] ([id number]) [id number] Page 1 of 9 Visit Report for 09/05/2023 Arrival Information Details Patient Name: Date of Service: [name], [name] 09/05/2023 10:00 A M Medical Record Number: [id number] Patient Account Number: [id number] Date of Birth/Sex: Treating RN: 25-Jan-1981 (42 y.o. Freddy Finner Primary Care Zackrey Dyar: PA Zenovia Jordan, NO Other Clinician: Betha Loa Referring Ryiah Bellissimo: Treating Amori Colomb/Extender: [name], [name], Referral Weeks in Treatment: 10 Visit Information History Since Last Visit All ordered tests and consults were completed: No Patient Arrived: Ambulatory Added or deleted any medications: No Arrival Time: 10:06 Any new allergies or adverse reactions: No Transfer Assistance: None Had a fall or experienced change in No Patient Identification Verified: Yes activities of daily living that may affect Secondary Verification Process Completed: Yes risk of falls: Patient Requires Transmission-Based Precautions: No Signs or symptoms of abuse/neglect since last visito No Patient Has Alerts: No Hospitalized since last visit: No Implantable device outside of the clinic excluding No cellular tissue based products placed in the [name] since last visit: Has Dressing in Place as Prescribed: Yes Has Compression in Place as Prescribed: Yes Pain Present Now: No Electronic Signature(s) Signed: 09/06/2023 5:20:54 PM By: Betha Loa Entered By: Betha Loa on 09/05/2023 07:07:16 -------------------------------------------------------------------------------- Clinic Level of Care Assessment Details Patient Name: Date of Service: [name], [name] 09/05/2023 10:00 A M Medical Record Number: [id number] Patient Account Number: [id number] Date of Birth/Sex: Treating RN: 10-14-1981 (42 y.o. Freddy Finner Primary Care Zeppelin Beckstrand: PA Zenovia Jordan, [place] Other Clinician: Betha Loa Referring Moesha Sarchet: Treating Jissel Slavens/Extender: RO  BSO N, [name], Referral Weeks in Treatment: 10 Clinic Level of Care Assessment Items TOOL 1 Quantity Score []  - 0 Use when EandM and Procedure is performed on INITIAL visit ASSESSMENTS - Nursing Assessment / Reassessment []  - 0 General Physical Exam (combine w/ comprehensive assessment (listed just below) when performed on new pt. evalsJAEL, [name] [name] ([id number]) [id number] Page 2 of 9 []  - 0 Comprehensive Assessment (HX, ROS, Risk Assessments, Wounds Hx, etc.) ASSESSMENTS - Wound and Skin Assessment / Reassessment []  - 0 Dermatologic / Skin Assessment (not related to wound area) ASSESSMENTS - Ostomy and/or Continence Assessment and Care []  - 0 Incontinence Assessment and Management []  - 0 Ostomy Care Assessment and Management (repouching, etc.) PROCESS - Coordination of Care []  - 0 Simple Patient / Family Education for ongoing care []  - 0 Complex (extensive) Patient / Family Education for ongoing care []  - 0 Staff obtains Chiropractor, Records, T Results / Process Orders est []  - 0 Staff telephones HHA, Nursing Homes / Clarify orders / etc []  - 0 Routine Transfer to another Facility (non-emergent condition) []  - 0 Routine [name] Admission (non-emergent condition) []  - 0 New Admissions / Manufacturing engineer / Ordering NPWT Apligraf, etc. , []  - 0 Emergency [name] Admission (emergent condition) PROCESS - Special Needs []  - 0 Pediatric / Minor Patient Management []  - 0 Isolation Patient Management []  - 0 Hearing / Language / Visual special needs []  - 0 Assessment of Community assistance (transportation, D/C planning, etc.) []  - 0 Additional assistance / Altered mentation []  - 0 Support Surface(s) Assessment (bed, cushion, seat, etc.) INTERVENTIONS - Miscellaneous []  - 0 External ear exam []  - 0 Patient Transfer (multiple staff / Nurse, adult / Similar devices) []  - 0 Simple Staple / Suture removal (25 or less) []  -  0 Complex Staple / Suture removal (26 or more) []  - 0 Hypo/Hyperglycemic Management (do not check if billed separately) []  -  0 Ankle / Brachial Index (ABI) - do not check if billed separately Has the patient been seen at the [name] within the last three years: Yes Total Score: 0 Level Of Care: ____ Electronic Signature(s) Signed: 09/06/2023 5:20:54 PM By: Betha Loa Entered By: Betha Loa on 09/05/2023 07:25:45 -------------------------------------------------------------------------------- Compression Therapy Details Patient Name: Date of Service: [name], [name] 09/05/2023 10:00 A M Medical Record Number: [id number] Patient Account Number: [id number] Date of Birth/Sex: Treating RN: Aug 10, 1981 (42 y.o. Freddy Finner Primary Care Mayela Bullard: PA Fidela Juneau Other Clinician: Cherly, [place] [name] ([id number]) [id number] Page 3 of 9 Referring Cabell Lazenby: Treating Lamoine Fredricksen/Extender: [name], [name], Referral Weeks in Treatment: 10 Compression Therapy Performed for Wound Assessment: Wound #7 Right,Lateral Lower Leg Performed By: Farrel Gordon, Angie, Compression Type: Double Layer Pre Treatment ABI: 0.7 Post Procedure Diagnosis Same as Pre-procedure Electronic Signature(s) Signed: 09/06/2023 5:20:54 PM By: Betha Loa Entered By: Betha Loa on 09/05/2023 07:25:04 -------------------------------------------------------------------------------- Encounter Discharge Information Details Patient Name: Date of Service: [name], [name] 09/05/2023 10:00 A M Medical Record Number: [id number] Patient Account Number: [id number] Date of Birth/Sex: Treating RN: 1980/12/22 (42 y.o. Freddy Finner Primary Care Ladana Chavero: PA Zenovia Jordan, NO Other Clinician: Betha Loa Referring Tanija Germani: Treating Pernella Ackerley/Extender: [name], [name], Referral Weeks in Treatment: 10 Encounter Discharge Information Items Discharge Condition:  Stable Ambulatory Status: Ambulatory Discharge Destination: Home Transportation: Private Auto Accompanied By: self Schedule Follow-up Appointment: Yes Clinical Summary of Care: Electronic Signature(s) Signed: 09/06/2023 5:20:54 PM By: Betha Loa Entered By: Betha Loa on 09/05/2023 07:45:40 -------------------------------------------------------------------------------- Lower Extremity Assessment Details Patient Name: Date of Service: [name], [name] 09/05/2023 10:00 A M Medical Record Number: [id number] Patient Account Number: [id number] Date of Birth/Sex: Treating RN: 1981/06/29 (42 y.o. Freddy Finner Primary Care Awab Abebe: PA Zenovia Jordan, [place] Other Clinician: Betha Loa Referring Liam Cammarata: Treating Rylann Munford/Extender: Gwendolyn Lima Self, Referral Weeks in TreatmentNevelyn Duffield, Leslyn [name] ([id number]) [id number] Page 4 of 9 Edema Assessment Assessed: [Left: No] [Right: Yes] Edema: [Left: Ye] [Right: s] Calf Left: Right: Point of Measurement: 38 cm From Medial Instep 58 cm Ankle Left: Right: Point of Measurement: 13 cm From Medial Instep 34.4 cm Vascular Assessment Pulses: Dorsalis Pedis Palpable: [Right:Yes] Extremity colors, hair growth, and conditions: Extremity Color: [Right:Normal] Hair Growth on Extremity: [Right:Yes] Temperature of Extremity: [Right:Warm < 3 seconds] Toe Nail Assessment Left: Right: Thick: No Discolored: No Deformed: No Improper Length and Hygiene: No Electronic Signature(s) Signed: 09/06/2023 5:20:54 PM By: Betha Loa Signed: 09/07/2023 11:41:18 AM By: Yevonne Pax RN Entered By: Betha Loa on 09/05/2023 07:19:14 -------------------------------------------------------------------------------- Multi Wound Chart Details Patient Name: Date of Service: [name], [name] 09/05/2023 10:00 A M Medical Record Number: [id number] Patient Account Number: [id number] Date of Birth/Sex: Treating RN: Sep 07, 1981  (42 y.o. Freddy Finner Primary Care Vint Pola: PA Zenovia Jordan, [place] Other Clinician: Betha Loa Referring Laurin Morgenstern: Treating Adaeze Better/Extender: [name], [name], Referral Weeks in Treatment: 10 Vital Signs Height(in): 73 Pulse(bpm): 90 Weight(lbs): 436 Blood Pressure(mmHg): 132/80 Body Mass Index(BMI): 57.5 Temperature(F): 97.7 Respiratory Rate(breaths/min): 18 [7:Photos:] [N/A:N/A] Right, Lateral Lower Leg N/A N/A Wound Location: Gradually Appeared N/A N/A Wounding Event: Venous Leg Ulcer N/A N/A Primary Etiology: Lymphedema, Hypertension N/A N/A Comorbid History: 06/06/2023 N/A N/A Date Acquired: 10 N/A N/A Weeks of Treatment: Open N/A N/A Wound Status: No N/A N/A Wound Recurrence: 2x2.5x0.4 N/A N/A Measurements L x W x D (cm) 3.927 N/A N/A A (  cm) : rea 1.571 N/A N/A Volume (cm) : -13.60% N/A N/A % Reduction in A rea: -51.50% N/A N/A % Reduction in Volume: Full Thickness Without Exposed N/A N/A Classification: Support Structures None Present N/A N/A Exudate Amount: Small (1-33%) N/A N/A Granulation Amount: Red, Pink N/A N/A Granulation Quality: Medium (34-66%) [name]: Fat Layer (Subcutaneous Tissue): Yes N/A N/A Exposed Structures: Fascia: No Tendon: No Muscle: No Joint: No Bone: No None N/A N/A Epithelialization: Treatment Notes Electronic Signature(s) Signed: 09/06/2023 5:20:54 PM By: Betha Loa Entered By: Betha Loa on 09/05/2023 07:19:20 -------------------------------------------------------------------------------- Multi-Disciplinary Care Plan Details Patient Name: Date of Service: [name], [name] 09/05/2023 10:00 A M Medical Record Number: [id number] Patient Account Number: [id number] Date of Birth/Sex: Treating RN: 08-Jul-1981 (42 y.o. Freddy Finner Primary Care Carlous Olivares: PA Zenovia Jordan, [place] Other Clinician: Betha Loa Referring Jerrold Haskell: Treating Kaisa Wofford/Extender: [name], [name],  Referral Weeks in Treatment: 10 Active Inactive Venous Leg Ulcer Nursing Diagnoses: Actual venous Insuffiency (use after diagnosis is confirmed) Knowledge deficit related to disease process and management Goals: Patient will maintain optimal edema control Date Initiated: 06/27/2023 Date Inactivated: 08/01/2023 Target Resolution Date: 07/28/2023 Goal Status: Met Patient/caregiver will verbalize understanding of disease process and disease management Date Initiated: 06/27/2023 Date Inactivated: 08/01/2023 Target Resolution Date: 07/28/2023 Goal Status: BERNETTE, DEGEORGE [name] ([id number]) [phone].pdf Page 6 of 9 Verify adequate tissue perfusion prior to therapeutic compression application Date Initiated: 06/27/2023 Target Resolution Date: 09/27/2023 Goal Status: Active Interventions: Assess peripheral edema status every visit. Compression as ordered Provide education on venous insufficiency Treatment Activities: Non-invasive vascular studies : 06/27/2023 T ordered outside of clinic : 06/27/2023 est Therapeutic compression applied : 06/27/2023 Notes: Wound/Skin Impairment Nursing Diagnoses: Impaired tissue integrity Knowledge deficit related to ulceration/compromised skin integrity Goals: Patient/caregiver will verbalize understanding of skin care regimen Date Initiated: 06/27/2023 Date Inactivated: 08/01/2023 Target Resolution Date: 07/28/2023 Goal Status: Met Ulcer/skin breakdown will have a volume reduction of 30% by week 4 Date Initiated: 06/27/2023 Date Inactivated: 08/01/2023 Target Resolution Date: 07/28/2023 Goal Status: Met Ulcer/skin breakdown will have a volume reduction of 50% by week 8 Date Initiated: 06/27/2023 Date Inactivated: 08/22/2023 Target Resolution Date: 08/27/2023 Goal Status: Met Ulcer/skin breakdown will have a volume reduction of 80% by week 12 Date Initiated: 06/27/2023 Target Resolution Date: 09/27/2023 Goal Status: Active Interventions: Assess  patient/caregiver ability to obtain necessary supplies Assess patient/caregiver ability to perform ulcer/skin care regimen upon admission and as needed Assess ulceration(s) every visit Provide education on ulcer and skin care Treatment Activities: Skin care regimen initiated : 06/27/2023 Notes: Electronic Signature(s) Signed: 09/06/2023 5:20:54 PM By: Betha Loa Signed: 09/07/2023 11:41:18 AM By: Yevonne Pax RN Entered By: Betha Loa on 09/05/2023 07:26:04 -------------------------------------------------------------------------------- Pain Assessment Details Patient Name: Date of Service: [name], [name] 09/05/2023 10:00 A M Medical Record Number: [id number] Patient Account Number: [id number] Date of Birth/Sex: Treating RN: 1981/03/03 (42 y.o. Freddy Finner Primary Care Kreig Parson: PA Zenovia Jordan, [place] Other Clinician: Betha Loa Referring Kimela Malstrom: Treating Askia Hazelip/Extender: Chauncey Mann, [name], Referral Weeks in Treatment: 10 Active Problems Letterman, Emalyn [name] ([id number]) [id number] Page 7 of 9 Location of Pain Severity and Description of Pain Patient Has Paino No Site Locations Pain Management and Medication Current Pain Management: Electronic Signature(s) Signed: 09/06/2023 5:20:54 PM By: Betha Loa Signed: 09/07/2023 11:41:18 AM By: Yevonne Pax RN Entered By: Betha Loa on 09/05/2023 07:16:11 -------------------------------------------------------------------------------- Patient/Caregiver Education Details Patient Name: Date of Service: [name], Shuntavia [name]. 10/16/2024andnbsp10:00 A M  Medical Record Number: [id number] Patient Account Number: [id number] Date of Birth/Gender: Treating RN: [birth year] (42 y.o. Freddy Finner Primary Care Physician: PA Zenovia Jordan, [place] Other Clinician: Betha Loa Referring Physician: Treating Physician/Extender: [name], [name], Referral Weeks in Treatment: 10 Education Assessment Education  Provided To: Patient Education Topics Provided Venous: Handouts: Controlling Swelling with Compression Stockings, Other: discussed compression stockings and lymphedema pumps Methods: Explain/Verbal Responses: State content correctly Electronic Signature(s) Signed: 09/06/2023 5:20:54 PM By: Betha Loa Entered By: Betha Loa on 09/05/2023 07:27:05 [name], [name] ([id number]) [id number] Page 8 of 9 -------------------------------------------------------------------------------- Wound Assessment Details Patient Name: Date of Service: [name], [name] 09/05/2023 10:00 A M Medical Record Number: [id number] Patient Account Number: [id number] Date of Birth/Sex: Treating RN: Mar 30, 1981 (42 y.o. Freddy Finner Primary Care Ender Rorke: PA Zenovia Jordan, [place] Other Clinician: Betha Loa Referring Tracen Mahler: Treating Payton Moder/Extender: [name], [name], Referral Weeks in Treatment: 10 Wound Status Wound Number: 7 Primary Etiology: Venous Leg Ulcer Wound Location: Right, Lateral Lower Leg Wound Status: Open Wounding Event: Gradually Appeared Comorbid History: Lymphedema, Hypertension Date Acquired: 06/06/2023 Weeks Of Treatment: 10 Clustered Wound: No Photos Wound Measurements Length: (cm) 2 Width: (cm) 2.5 Depth: (cm) 0.4 Area: (cm) 3.927 Volume: (cm) 1.571 % Reduction in Area: -13.6% % Reduction in Volume: -51.5% Epithelialization: None Wound Description Classification: Full Thickness Without Exposed Suppor Exudate Amount: None Present t Structures Foul Odor After Cleansing: No Slough/Fibrino Yes Wound Bed Granulation Amount: Small (1-33%) Exposed Structure Granulation Quality: Red, Pink Fascia Exposed: No Necrotic Amount: Medium (34-66%) Fat Layer (Subcutaneous Tissue) Exposed: Yes Necrotic Quality: Adherent Slough Tendon Exposed: No Muscle Exposed: No Joint Exposed: No Bone Exposed: No Treatment Notes Wound #7 (Lower Leg) Wound  Laterality: Right, Lateral Cleanser Soap and Water Discharge Instruction: Gently cleanse wound with antibacterial soap, rinse and pat dry prior to dressing wounds Vashe 5.8 (oz) Discharge Instruction: Use vashe 5.8 (oz) as directed Latrinity, [place] [name] ([id number]) [id number] Page 9 of 9 Peri-Wound Care Topical Primary Dressing IODOFLEX 0.9% Cadexomer Iodine Pad Discharge Instruction: Apply Iodoflex to wound bed only as directed. Secondary Dressing ABD Pad 5x9 (in/in) Discharge Instruction: Cover with ABD pad Secured With Compression Wrap Urgo K2, two layer compression system, large Compression Stockings Add-Ons Electronic Signature(s) Signed: 09/06/2023 5:20:54 PM By: Betha Loa Signed: 09/07/2023 11:41:18 AM By: Yevonne Pax RN Entered By: Betha Loa on 09/05/2023 07:17:19 -------------------------------------------------------------------------------- Vitals Details Patient Name: Date of Service: [name], [name] 09/05/2023 10:00 A M Medical Record Number: [id number] Patient Account Number: [id number] Date of Birth/Sex: Treating RN: 07-10-1981 (42 y.o. Freddy Finner Primary Care Filmore Molyneux: PA Zenovia Jordan, [place] Other Clinician: Betha Loa Referring Zaquan Duffner: Treating Orlen Leedy/Extender: [name], [name], Referral Weeks in Treatment: 10 Vital Signs Time Taken: 10:07 Temperature (F): 97.7 Height (in): 73 Pulse (bpm): 90 Weight (lbs): 436 Respiratory Rate (breaths/min): 18 Body Mass Index (BMI): 57.5 Blood Pressure (mmHg): 132/80 Reference Range: 80 - 120 mg / dl Electronic Signature(s) Signed: 09/06/2023 5:20:54 PM By: Betha Loa Entered By: Betha Loa on 09/05/2023 07:09:44

## 2023-09-12 ENCOUNTER — Encounter: Payer: Self-pay | Admitting: Internal Medicine

## 2023-09-13 NOTE — Progress Notes (Addendum)
Jessica, [name] [name] ([id number]) [id number] Page 1 of 9 Visit Report for 09/12/2023 HPI Details Patient Name: Date of Service: [name] 09/12/2023 8:30 A M Medical Record Number: [id number] Patient Account Number: [id number] Date of Birth/Sex: Treating RN: 08-07-1981 (42 y.o. [name] Primary Care Provider: PA TIENT, NO Other Clinician: Referring Provider: Treating Provider/Extender: [name], [name], Referral Weeks in Treatment: 11 History of Present Illness HPI Description: 42 year old patient well known to our [facility] wound care clinic where she has been seen since 2016 for bilateral lower extremity venous insufficiency disease with lymphedema and multiple ulcerations associated with morbid obesity. she had custom-made compression stockings and lymphedema pumps which were used in the past. most recently she was admitted to the hospital between October 11 and 09/02/2017 with sepsis, lower extremity wounds and lymphedema.she was initially treated in the outpatient with Keflex and Bactrim. she was initially treated in the hospital with vancomycin and Zosyn and changed over to Unasyn until her white count improved and her blood cultures were negative for 3 days. After her inpatient management she was discharged home on Augmentin to end on 09/13/2017 with a 14 day course. she has had outpatient vascular duplex scans completed in November 2017 and her right ABI was 1.1 and the left ABI is 1.3. she had normal toe brachial indices bilaterally.she had three-vessel runoff in the right lower extremity and two-vessel runoff in the left lower extremity. On questioning the patient she does have custom made compression stockings and also has a lymphedema pump but has not been using it appropriately and has not been taking good care of herself. 09/17/2017 -- she returns today with compression stockings on the left side and the right side has had significant  amount of drainage and has a very strong odor 09/24/2017 -- the drainage is increased significantly and she has more lymphedema and a very strong odor to her wound. Though she does not have systemic symptoms, or overt infection I believe she will benefit from some doxycycline given empirically. 10/01/2017 -- after starting the doxycycline and changing the dressing twice a week her symptoms and signs have definitely improved overall. 10/08/2017 -- she has completed her course of doxycycline but continues to have a lot of drainage and needs twice a week dressing changes. 11/08/17-she is here in follow-up evaluation for right lower extremity ulcers. She admits to using her lymphedema pumps twice daily, one hour per session. she is voicing no complaints or concerns, no signs of infection will change to [facility] 12/14/17 on evaluation today patient appears to be doing very well in regard to her wounds. She has been tolerating the dressing changes she continues to develop some portly the adherent granular tissue on the surface of the wound with some Slough. Obviously we are trying to get too much better wound bed. With that being said the hyper granulation the Hydrofera Blue Dressing to have helped with which is excellent news. However I think it may be time to try something a little bit different at this point. 01/11/18 on evaluation today patient appears to be doing fairly well in regard to her right lateral lower extremity ulcers. This shows excellent signs of filling in which is great news. There does not appear to be any evidence of infection which is also good news. She does continue to work as well is good school. She is having no pain. 01/22/18 on evaluation today patient appears to be doing a little bit worse in my  opinion in regard to the overall quality of that granulation on her right lower extremity. She was not here last week due to being sick with a stomach virus this may have something to do with  the fact that her wound appears to be a little bit worse. With that being said I'm also thinking that after switching from the [facility] Dressing to the silver collagen would really has not looked that's good in my opinion. We may want to swit 02/11/18 on evaluation today patient's right lower/lateral lower extremity ulcers appear to be doing very well at this point. Especially the more proximal ulcer has filled in much closer to surface which is good news. Nonetheless both show signs of improvement which is great news. There does not appear to be any evidence of infection which is also good news. In general patient has been doing well tolerating the wraps as well as the [employer]. 02/18/18 on evaluation today patient appears to be doing a little bit worse in regard to the periwound region the wounds themselves do not look much deteriorated to me. With that being said she has several small blisters/pustules noted in the periwound and there was a significant amount of drainage and maceration compared to previous. There has been a time that we had to bring her back for twice a week dressing changes as far as her wrap was concerned it has been a while since we've done that however. With that being said the patient has been having some burning and in general I'm concerned about the possibility of infection. She has previously taken doxycycline with good result. Fortunately there does not appear to be any evidence of overall worsening in regard to the size of the wound and in fact the upper wound actually appears to be showing signs of good epithelialization. 03/18/18 on evaluation today patient appears to be doing excellent in regard to her right lateral lower extremity ulcer. She has been tolerating the dressing changes without complication. Fortunately this seems to be making great progress. Overall I see no signs of infection and there is dramatic improvement overall even compared to last  week. 03/28/18 on evaluation today patient appears to be doing very well in regard to her right lateral lower surety ulcer. She has been tolerating the dressing changes without complication at this point. She states currently that she's having no significant discomfort which is excellent as well. Overall I'm pleased with how things seem to be progressing. JANARA, RUFFINI [name] ([id number]) [id number] Page 2 of 9 04/01/18 on evaluation today patient appears to be doing excellent in regard to her right lateral lower extremity ulcers. She has been tolerating the dressing changes without complication which is good news. With that being said the wraps still continues to show signs of helping with her fluid she does have Juxta-Lite compression stockings for when we are done with the current treatment regimen once everything heals. Mainly she just has the one area still remaining the smaller of the two wings is pretty much closed at this point there's just a very slight opening noted. 04/08/18 on evaluation today patient appears to be doing better in regard to the original wound on the right lateral lower extremity that we have been managing. Unfortunately she has a new area of weeping more anterior on the right lateral lower extremity and on the left lower extremity she has two new ulcers there appears to be some cellulitis noted at this time. I am concerned about the fact  that this may in fact be an infection that has caused the worsening and swelling in the past this has been the case when we previously attempted to determine what was going on when she had down slides like this. With that being said the patient is seeming to tolerate the wraps fairly well for the most part. 04/17/18; since last time the patient was seen in this clinic she was hospitalized from 04/11/18 through 04/13/18; she presented with bilateral lower extremity pain worse on the right and a fever of up to 104. Noteworthy  that when she was in the clinic last week she had new wounds on the left leg culture grew MRSA and she was prescribed Bactrim. She had 2 days of IV Vanco and Zosyn in the hospital. Her blood cultures were negative. She was discharged on Bactrim to cover the original MRSA on the right leg and Keflex to cover the possibility of strep. The hospitalist had a conversation with infectious disease. The patient arrives in clinic today for nurse check however given the recent hospitalization I was asked to look at her. The patient states she feels a lot better. No fever or chills. Still some pain in the right calf but a lot better. She arrived in the hospital with a white count of 15.6, the next day was 10.8. Comprehensive metabolic panel was normal. She is still taking Keflex and Bactrim It would appear that she had a surgical IandD at the bedside of the right calf felt to have a underlying abscess. According to our intake nurse the wound has expanded quite bit on the right lateral calf. She has no open area on the left anterior and left posterior calf as described last time 04/23/18 on evaluation today patient actually appears to be doing much better than when I last saw her. This obviously has been a couple weeks ago and in the interim she was admitted to the hospital for IV antibiotic therapy due to cellulitis, discharge, and fortunately the substance which hadn't sued has completely resolved. Her swelling seems to be better in regard to her lower extremities as well and the wounds that opened up as results of the infection seems to be showing signs of improvement. There is some Slough noted on the left lateral wound otherwise a lot of weeping noted of the right lateral leg. 04/29/18 on evaluation today patient appears to be doing excellent in regard to her bilateral lower extremity ulcers. She's been tolerating the dressing changes without complication there does not appear to be any evidence of infection at  this time. Overall I think she is made great improvement and seems to be showing signs of coming back around where she was prior to the cellulitis and sepsis episode. 05/06/18 on evaluation today patient appears to be doing better in regard to her bilateral ocean the ulcers. She's been tolerating the dressing changes without complication. Fortunately there does not appear to be any evidence of infection at this time. Her swelling is significantly down overall seems to be showing signs of good improvement as well. Fortunately I do believe that she is progressing nicely back to where she was prior to the cellulitis/sepsis issue. 05/13/18 on evaluation today patient seems to be showing signs of great improvement she's been tolerating the dressing changes without complication and overall there does not appear to be any evidence of infection. All of which is good news. The only issue she really have today is that some of the [facility] Dressing actually  was stuck to the periwound of the right lateral lower extremity however this was able to be removed during the debridement without complication. 05/21/18 on evaluation today patient appears to be doing very well in regard to her bilateral lower extremity ulcers. She has been tolerating the compression wraps without complication. There does not appear to be any evidence of infection at this point which is good news as well. Overall I'm very pleased with the progress that has been made. She likewise is very happy. 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 05/28/18 on evaluation today patient actually appears to be doing excellent in regard to her bilateral lower [place] ulcers. She's made great progress since last week and overall I'm very pleased in this regard. She states that she's not have any discomfort either which is also good news there's  definitely no evidence of infection. 06/03/18 on evaluation today patient actually appears to be doing excellent in regard to her ulcerations in fact these areas on both lower extremities were almost completely healed. Unfortunately she has a situation going on her life right now she actually found her husband deceased last Oct 18, 2023. She states that she has been quite a bit upset since that time unfortunately this is obviously a very big blow to her. Nonetheless she is concerned about the funeral and having to wear certain shoes for this that she states she cannot fit in with the wraps. She wonders if she could potentially come back on 10-18-2023 to have her wraps changed and to switch into her Juxta-Lite compression at that point. I think that would be an okay thing that we could do for her. Nonetheless she fortunately seems to be tolerating the wraps very well and again has made excellent progress with the [facility] Dressing 06/11/18 on evaluation today patient appears to be doing rather well in regard to her bilateral lower extremities in fact everything appears to be completely healed at this point which is excellent news. There does not appear to be any evidence of infection at this time which is great. In fact overall I do believe she is completely resolved in regard to her bilateral lower extremity ulcers. She is extremely happy in this regard. 06/27/2023 [name] is a 42 year old female with a past medical history of venous insufficiency and lymphedema that presents to the clinic for a 3-week history of nonhealing wound to the right lower extremity. She states this started spontaneously. She does not wear compression stockings. She has been keeping the area covered. She currently denies signs of infection. 8/14; patient readmitted to clinic last week. She has venous insufficiency and chronic secondary lymphedema. She has a nonhealing wound on the right lateral lower leg. She has been using  gent mupirocin and Hydrofera Blue using an Urgo K2 lite wrap. Her ABI on the right leg was 0.74 on admission to clinic. Formal arterial studies were ordered but we have not yet had any contact with the patient. Also there was some question about juxta lites for this patient we have not heard anything about that either. 8/28; patient presents for follow-up. She had ABIs completed on 8/23 that showed noncompressible ABIs on the right and left with TBI of 0.88 on the left and 0.96 on the right. She had triphasic waveforms throughout the arterial system. This is reassuring that she has adequate blood flow for healing. We have been using antibiotic  ointment with Hydrofera Blue under Urgo K2 lite wrap. She has tolerated this well. Wound is smaller. 9/4; patient presents for follow-up. We have been using Urgo 2 regular4-layer equivalent as well as Hydrofera Blue and antibiotic ointment. Wound is slightly smaller. She has no issues or complaints. She tolerated the wrap well. 9/11; patient presents for follow-up. We have been using Hydrofera Blue with antibiotic ointment under Urgo regular compression. She states the wrap slid down on Sunday and she has been keeping the area covered for the past 2 to 3 days. 9/18; small wound on the right lateral lower leg this is in the setting of significant lymphedema. We have you been using gent and mupirocin with Hydrofera Blue and Urgo K2 regular compression. The wound is about the same size this week. At 1 point she got wraparound stockings/external compression stockings during her time in our [place] [facility]. I am not sure she is actually been wearing these in fact I think not 9/25; right lateral leg in the setting of very significant stage III lymphedema. We have been using gent and mupirocin under Hydrofera Blue and using an Urgo K2 lite. She works in a daycare 10/2; right lateral leg in the setting of stage III lymphedema which is marginally controlled and  spite of 4-layer equivalent compression with Harlene Salts we have Satz, [name] ([id number]) [phone].pdf Page 3 of 9 been using gent and mupirocin with Hydrofera Blue, not making much progress however in the small circular wound with a very fibrinous surface. 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 She tells [place] a pipe burst in her apartment she got the wrap wet while cleaning up took it off 10/9; right lateral lower leg in the setting of stage III lymphedema. Small punched-out area but with a continuously nonviable surface. I changed to her to Iodoflex last week to help with the surface although she still comes in with a lot of fibrinous debris 10/16; right lateral lower leg wound in the setting of stage III lymphedema significant hemosiderin deposition but no obvious erythema. She arrives in clinic today with a much better looking wound surface. I started her on Iodoflex 2 weeks ago and did an aggressive debridement last week. The dimensions are slightly slightly larger today probably because of the debridement, however I think the surface of the wound is quite a bit better today. 10/23; comes in today with a large amount of sanguinous drainage. This is different for this patient. She had no debridement last week. She says that there is some more pain than she is used to. She has not been systemically unwell. We have been using Iodoflex Electronic Signature(s) Signed: 09/13/2023 3:33:32 PM By: Baltazar Najjar MD Entered By: Baltazar Najjar on 09/12/2023 06:21:09 -------------------------------------------------------------------------------- Physical Exam Details Patient Name: Date of Service: [name], [name] 09/12/2023 8:30 A M Medical Record Number: [id number] Patient Account Number: [id number] Date of Birth/Sex: Treating RN: [birth year] (42 y.o. [name] Primary Care Provider: PA Zenovia Jordan, NO Other Clinician: Referring Provider: Treating Provider/Extender: RO  BSO N, [name], Referral Weeks in Treatment: 11 Constitutional Patient is hypertensive.. Pulse regular and within target range for patient.Marland Kitchen Respirations regular, non-labored and within target range.. Temperature is normal and within the target range for the patient.Marland Kitchen appears in no distress. Cardiovascular Pedal pulses palpable On the right. Notes Wound exam; right lateral lower leg. Quite a change this week very tender inferiorly. Some purulence noted. Culture taken for CandS [swab culture]. Her  edema control is adequate. Electronic Signature(s) Signed: 09/13/2023 3:33:32 PM By: Baltazar Najjar MD Entered By: Baltazar Najjar on 09/12/2023 06:22:15 -------------------------------------------------------------------------------- Physician Orders Details Patient Name: Date of Service: [name], [name] 09/12/2023 8:30 A M Medical Record Number: [id number] Patient Account Number: [id number] Date of Birth/Sex: Treating RN: 1981-01-16 (42 y.o. [name] Primary Care Provider: PA Zenovia Jordan, NO Other Clinician: Referring Provider: Treating Provider/Extender: Gwendolyn Lima Self, Referral Weeks in Treatment: 11 [place]amorro, [name] ([id number]) [id number] Page 4 of 9 The following information was scribed by: Midge Aver The information was scribed for: Maxwell Caul Verbal / Phone Orders: No Diagnosis Coding Follow-up Appointments Return Appointment in 1 week. Nurse Visit as needed Bathing/ Shower/ Hygiene May shower with wound dressing protected with water repellent cover or cast protector. No tub bath. Anesthetic (Use 'Patient Medications' Section for Anesthetic Order Entry) Lidocaine applied to wound bed Wound Treatment Wound #7 - Lower Leg Wound Laterality: Right, Lateral Cleanser: Soap and Water 1 x Per Week/30 Days Discharge Instructions: Gently cleanse wound with antibacterial soap, rinse and pat dry prior to dressing wounds Cleanser:  Vashe 5.8 (oz) 1 x Per Week/30 Days Discharge Instructions: Use vashe 5.8 (oz) as directed Prim Dressing: Silvercel 4 1/4x 4 1/4 (in/in) 1 x Per Week/30 Days ary Discharge Instructions: Apply Silvercel 4 1/4x 4 1/4 (in/in) as instructed Secondary Dressing: Zetuvit Plus 4x8 (in/in) 1 x Per Week/30 Days Compression Wrap: Urgo K2, two layer compression system, large 1 x Per Week/30 Days Laboratory Bacteria identified in Wound by Culture (MICRO) LOINC Code: [id number] Convenience Name: Wound culture routine Patient Medications llergies: Bactrim, sulfa A Notifications Medication Indication Start End wound infection 09/12/2023 doxycycline monohydrate DOSE oral 100 mg capsule - 1 capsule oral twice a day for 10 days Electronic Signature(s) Signed: 09/19/2023 10:56:04 AM By: Midge Aver MSN RN CNS WTA Signed: 09/19/2023 4:48:33 PM By: Baltazar Najjar MD Previous Signature: 09/12/2023 9:27:26 AM Version By: Baltazar Najjar MD Entered By: Midge Aver on 09/19/2023 07:56:03 -------------------------------------------------------------------------------- Problem List Details Patient Name: Date of Service: [facility], [name] 09/12/2023 8:30 A M Medical Record Number: [id number] Patient Account Number: [id number] Date of Birth/Sex: Treating RN: 20-Apr-1981 (42 y.o. [name] Primary Care Provider: PA Zenovia Jordan, NO Other Clinician: Referring Provider: Treating Provider/Extender: Samara Snide, Referral Ngan, [place] [name] ([id number]) [id number] Page 5 of 9 Weeks in Treatment: 11 Active Problems ICD-10 Encounter Code Description Active Date MDM Diagnosis L97.812 Non-pressure chronic ulcer of other part of right lower leg with fat layer 06/27/2023 No Yes exposed I87.311 Chronic venous hypertension (idiopathic) with ulcer of right lower extremity 06/27/2023 No Yes I89.0 Lymphedema, not elsewhere classified 06/27/2023 No Yes L03.115 Cellulitis of right lower limb  09/12/2023 No Yes Inactive Problems Resolved Problems Electronic Signature(s) Signed: 09/13/2023 3:33:32 PM By: Baltazar Najjar MD Entered By: Baltazar Najjar on 09/12/2023 06:20:01 -------------------------------------------------------------------------------- Progress Note Details Patient Name: Date of Service: [name], [name] 09/12/2023 8:30 A M Medical Record Number: [id number] Patient Account Number: [id number] Date of Birth/Sex: Treating RN: 1981-05-11 (42 y.o. [name] Primary Care Provider: PA TIENT, NO Other Clinician: Referring Provider: Treating Provider/Extender: [name], [name], Referral Weeks in Treatment: 11 Subjective History of Present Illness (HPI) 42 year old patient well known to our [facility] wound care clinic where she has been seen since 2016 for bilateral lower extremity venous insufficiency disease with lymphedema and multiple ulcerations associated with morbid obesity. she had custom-made compression stockings  and lymphedema pumps which were used in the past. most recently she was admitted to the hospital between October 11 and 09/02/2017 with sepsis, lower extremity wounds and lymphedema.she was initially treated in the outpatient with Keflex and Bactrim. she was initially treated in the hospital with vancomycin and Zosyn and changed over to Unasyn until her white count improved and her blood cultures were negative for 3 days. After her inpatient management she was discharged home on Augmentin to end on 09/13/2017 with a 14 day course. she has had outpatient vascular duplex scans completed in November 2017 and her right ABI was 1.1 and the left ABI is 1.3. she had normal toe brachial indices bilaterally.she had three-vessel runoff in the right lower extremity and two-vessel runoff in the left lower extremity. On questioning the patient she does have custom made compression stockings and also has a lymphedema pump but has not been using it  appropriately and has not been taking good care of herself. 09/17/2017 -- she returns today with compression stockings on the left side and the right side has had significant amount of drainage and has a very strong odor 09/24/2017 -- the drainage is increased significantly and she has more lymphedema and a very strong odor to her wound. Though she does not have systemic symptoms, or overt infection I believe she will benefit from some doxycycline given empirically. [name], [name] ([id number]) [id number] Page 6 of 9 10/01/2017 -- after starting the doxycycline and changing the dressing twice a week her symptoms and signs have definitely improved overall. 10/08/2017 -- she has completed her course of doxycycline but continues to have a lot of drainage and needs twice a week dressing changes. 11/08/17-she is here in follow-up evaluation for right lower extremity ulcers. She admits to using her lymphedema pumps twice daily, one hour per session. she is voicing no complaints or concerns, no signs of infection will change to [facility] 12/14/17 on evaluation today patient appears to be doing very well in regard to her wounds. She has been tolerating the dressing changes she continues to develop some portly the adherent granular tissue on the surface of the wound with some Slough. Obviously we are trying to get too much better wound bed. With that being said the hyper granulation the Hydrofera Blue Dressing to have helped with which is excellent news. However I think it may be time to try something a little bit different at this point. 01/11/18 on evaluation today patient appears to be doing fairly well in regard to her right lateral lower extremity ulcers. This shows excellent signs of filling in which is great news. There does not appear to be any evidence of infection which is also good news. She does continue to work as well is good school. She is having no pain. 01/22/18 on  evaluation today patient appears to be doing a little bit worse in my opinion in regard to the overall quality of that granulation on her right lower extremity. She was not here last week due to being sick with a stomach virus this may have something to do with the fact that her wound appears to be a little bit worse. With that being said I'm also thinking that after switching from the [facility] Dressing to the silver collagen would really has not looked that's good in my opinion. We may want to swit 02/11/18 on evaluation today patient's right lower/lateral lower extremity ulcers appear to be doing very well at this point. Especially the more proximal  ulcer has filled in much closer to surface which is good news. Nonetheless both show signs of improvement which is great news. There does not appear to be any evidence of infection which is also good news. In general patient has been doing well tolerating the wraps as well as the [employer]. 02/18/18 on evaluation today patient appears to be doing a little bit worse in regard to the periwound region the wounds themselves do not look much deteriorated to me. With that being said she has several small blisters/pustules noted in the periwound and there was a significant amount of drainage and maceration compared to previous. There has been a time that we had to bring her back for twice a week dressing changes as far as her wrap was concerned it has been a while since we've done that however. With that being said the patient has been having some burning and in general I'm concerned about the possibility of infection. She has previously taken doxycycline with good result. Fortunately there does not appear to be any evidence of overall worsening in regard to the size of the wound and in fact the upper wound actually appears to be showing signs of good epithelialization. 03/18/18 on evaluation today patient appears to be doing excellent in regard  to her right lateral lower extremity ulcer. She has been tolerating the dressing changes without complication. Fortunately this seems to be making great progress. Overall I see no signs of infection and there is dramatic improvement overall even compared to last week. 03/28/18 on evaluation today patient appears to be doing very well in regard to her right lateral lower surety ulcer. She has been tolerating the dressing changes without complication at this point. She states currently that she's having no significant discomfort which is excellent as well. Overall I'm pleased with how things seem to be progressing. 04/01/18 on evaluation today patient appears to be doing excellent in regard to her right lateral lower extremity ulcers. She has been tolerating the dressing changes without complication which is good news. With that being said the wraps still continues to show signs of helping with her fluid she does have Juxta-Lite compression stockings for when we are done with the current treatment regimen once everything heals. Mainly she just has the one area still remaining the smaller of the two wings is pretty much closed at this point there's just a very slight opening noted. 04/08/18 on evaluation today patient appears to be doing better in regard to the original wound on the right lateral lower extremity that we have been managing. Unfortunately she has a new area of weeping more anterior on the right lateral lower extremity and on the left lower extremity she has two new ulcers there appears to be some cellulitis noted at this time. I am concerned about the fact that this may in fact be an infection that has caused the worsening and swelling in the past this has been the case when we previously attempted to determine what was going on when she had down slides like this. With that being said the patient is seeming to tolerate the wraps fairly well for the most part. 04/17/18; since last time the  patient was seen in this clinic she was hospitalized from 04/11/18 through 04/13/18; she presented with bilateral lower extremity pain worse on the right and a fever of up to 104. Noteworthy that when she was in the clinic last week she had new wounds on the left leg culture grew  MRSA and she was prescribed Bactrim. She had 2 days of IV Vanco and Zosyn in the hospital. Her blood cultures were negative. She was discharged on Bactrim to cover the original MRSA on the right leg and Keflex to cover the possibility of strep. The hospitalist had a conversation with infectious disease. The patient arrives in clinic today for nurse check however given the recent hospitalization I was asked to look at her. The patient states she feels a lot better. No fever or chills. Still some pain in the right calf but a lot better. She arrived in the hospital with a white count of 15.6, the next day was 10.8. Comprehensive metabolic panel was normal. She is still taking Keflex and Bactrim It would appear that she had a surgical IandD at the bedside of the right calf felt to have a underlying abscess. According to our intake nurse the wound has expanded quite bit on the right lateral calf. She has no open area on the left anterior and left posterior calf as described last time 04/23/18 on evaluation today patient actually appears to be doing much better than when I last saw her. This obviously has been a couple weeks ago and in the interim she was admitted to the hospital for IV antibiotic therapy due to cellulitis, discharge, and fortunately the substance which hadn't sued has completely resolved. Her swelling seems to be better in regard to her lower extremities as well and the wounds that opened up as results of the infection seems to be showing signs of improvement. There is some Slough noted on the left lateral wound otherwise a lot of weeping noted of the right lateral leg. 04/29/18 on evaluation today patient appears to be  doing excellent in regard to her bilateral lower extremity ulcers. She's been tolerating the dressing changes without complication there does not appear to be any evidence of infection at this time. Overall I think she is made great improvement and seems to be showing signs of coming back around where she was prior to the cellulitis and sepsis episode. 05/06/18 on evaluation today patient appears to be doing better in regard to her bilateral ocean the ulcers. She's been tolerating the dressing changes without complication. Fortunately there does not appear to be any evidence of infection at this time. Her swelling is significantly down overall seems to be showing signs of good improvement as well. Fortunately I do believe that she is progressing nicely back to where she was prior to the cellulitis/sepsis issue. 05/13/18 on evaluation today patient seems to be showing signs of great improvement she's been tolerating the dressing changes without complication and overall there does not appear to be any evidence of infection. All of which is good news. The only issue she really have today is that some of the [facility] Dressing actually was stuck to the periwound of the right lateral lower extremity however this was able to be removed during the debridement without complication. 05/21/18 on evaluation today patient appears to be doing very well in regard to her bilateral lower extremity ulcers. She has been tolerating the compression wraps without complication. There does not appear to be any evidence of infection at this point which is good news as well. Overall I'm very pleased with the progress that has been made. She likewise is very happy. 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 05/28/18 on evaluation today  patient actually appears to be doing excellent in regard to her bilateral lower  [place] ulcers. She's made great progress since last week and overall I'm very pleased in this regard. She states that she's not have any discomfort either which is also good news there's definitely no evidence of infection. 06/03/18 on evaluation today patient actually appears to be doing excellent in regard to her ulcerations in fact these areas on both lower extremities were almost completely healed. Unfortunately she has a situation going on her life right now she actually found her husband deceased last 10-03-2023. She states that she has [name], [name] ([id number]) [id number] Page 7 of 9 been quite a bit upset since that time unfortunately this is obviously a very big blow to her. Nonetheless she is concerned about the funeral and having to wear certain shoes for this that she states she cannot fit in with the wraps. She wonders if she could potentially come back on 2023-10-03 to have her wraps changed and to switch into her Juxta-Lite compression at that point. I think that would be an okay thing that we could do for her. Nonetheless she fortunately seems to be tolerating the wraps very well and again has made excellent progress with the [facility] Dressing 06/11/18 on evaluation today patient appears to be doing rather well in regard to her bilateral lower extremities in fact everything appears to be completely healed at this point which is excellent news. There does not appear to be any evidence of infection at this time which is great. In fact overall I do believe she is completely resolved in regard to her bilateral lower extremity ulcers. She is extremely happy in this regard. 06/27/2023 [name] is a 42 year old female with a past medical history of venous insufficiency and lymphedema that presents to the clinic for a 3-week history of nonhealing wound to the right lower extremity. She states this started spontaneously. She does not wear compression stockings.  She has been keeping the area covered. She currently denies signs of infection. 8/14; patient readmitted to clinic last week. She has venous insufficiency and chronic secondary lymphedema. She has a nonhealing wound on the right lateral lower leg. She has been using gent mupirocin and Hydrofera Blue using an Urgo K2 lite wrap. Her ABI on the right leg was 0.74 on admission to clinic. Formal arterial studies were ordered but we have not yet had any contact with the patient. Also there was some question about juxta lites for this patient we have not heard anything about that either. 8/28; patient presents for follow-up. She had ABIs completed on 8/23 that showed noncompressible ABIs on the right and left with TBI of 0.88 on the left and 0.96 on the right. She had triphasic waveforms throughout the arterial system. This is reassuring that she has adequate blood flow for healing. We have been using antibiotic ointment with Hydrofera Blue under Urgo K2 lite wrap. She has tolerated this well. Wound is smaller. 9/4; patient presents for follow-up. We have been using Urgo 2 regular4-layer equivalent as well as Hydrofera Blue and antibiotic ointment. Wound is slightly smaller. She has no issues or complaints. She tolerated the wrap well. 9/11; patient presents for follow-up. We have been using Hydrofera Blue with antibiotic ointment under Urgo regular compression. She states the wrap slid down on Sunday and she has been keeping the area covered for the past 2 to 3 days. 9/18; small wound on the right lateral lower leg this is in  the setting of significant lymphedema. We have you been using gent and mupirocin with Hydrofera Blue and Urgo K2 regular compression. The wound is about the same size this week. At 1 point she got wraparound stockings/external compression stockings during her time in our [place] sister clinic. I am not sure she is actually been wearing these in fact I think not 9/25; right lateral  leg in the setting of very significant stage III lymphedema. We have been using gent and mupirocin under Hydrofera Blue and using an Urgo K2 lite. 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 She tells [place] a pipe burst in her apartment she got the wrap wet while cleaning up took it off 10/9; right lateral lower leg in the setting of stage III lymphedema. Small punched-out area but with a continuously nonviable surface. I changed to her to Iodoflex last week to help with the surface although she still comes in with a lot of fibrinous debris 10/16; right lateral lower leg wound in the setting of stage III lymphedema significant hemosiderin deposition but no obvious erythema. She arrives in clinic today with a much better looking wound surface. I started her on Iodoflex 2 weeks ago and did an aggressive debridement last week. The dimensions are slightly slightly larger today probably because of the debridement, however I think the surface of the wound is quite a bit better today. 10/23; comes in today with a large amount of sanguinous drainage. This is different for this patient. She had no debridement last week. She says that there is some more pain than she is used to. She has not been systemically unwell. We have been using Iodoflex Objective Constitutional Patient is hypertensive.. Pulse regular and within target range for patient.Marland Kitchen Respirations regular, non-labored and within target range.. Temperature is normal and within the target range for the patient.Marland Kitchen appears in no distress. Vitals Time Taken: 8:57 AM, Height: 73 in, Weight: 436 lbs, BMI: 57.5, Temperature: 97.8 F, Pulse: 88  bpm, Respiratory Rate: 18 breaths/min, Blood Pressure: 142/90 mmHg. Cardiovascular Pedal pulses palpable On the right. General Notes: Wound exam; right lateral lower leg. Quite a change this week very tender inferiorly. Some purulence noted. Culture taken for CandS [swab culture]. Her edema control is adequate. Integumentary (Hair, Skin) Wound #7 status is Open. Original cause of wound was Gradually Appeared. The date acquired was: 06/06/2023. The wound has been in treatment 11 weeks. The wound is located on the Right,Lateral Lower Leg. The wound measures 2.5cm length x 2.3cm width x 0.8cm depth; 4.516cm^2 area and 3.613cm^3 volume. There is Fat Layer (Subcutaneous Tissue) exposed. There is undermining starting at 10:00 and ending at 6:00 with a maximum distance of 1.5cm. There is a medium amount of sanguinous drainage noted. There is small (1-33%) red, pink granulation within the wound bed. There is a medium (34-66%) amount of necrotic tissue within the wound bed including Adherent Slough. Assessment [name], [name] ([id number]) [id number] Page 8 of 9 Active Problems ICD-10 Non-pressure chronic ulcer of other part of right lower leg with fat layer exposed Chronic venous hypertension (idiopathic) with ulcer of right lower extremity Lymphedema, not elsewhere classified Cellulitis of right lower limb Procedures  Wound #7 Pre-procedure diagnosis of Wound #7 is a Venous Leg Ulcer located on the Right,Lateral Lower Leg . There was a Four Layer Compression Therapy Procedure by Midge Aver, RN. Post procedure Diagnosis Wound #7: Same as Pre-Procedure Plan Follow-up Appointments: Return Appointment in 1 week. Nurse Visit as needed Bathing/ Shower/ Hygiene: May shower with wound dressing protected with water repellent cover or cast protector. No tub bath. Anesthetic (Use 'Patient Medications' Section for Anesthetic Order Entry): Lidocaine applied to wound bed Edema  Control - Lymphedema / Segmental Compressive Device / Other: Optional: One layer of unna paste to top of compression wrap (to act as an anchor). UrgoK2 - Large The following medication(s) was prescribed: doxycycline monohydrate oral 100 mg capsule 1 capsule oral twice a day for 10 days for wound infection starting 09/12/2023 WOUND #7: - Lower Leg Wound Laterality: Right, Lateral Cleanser: Soap and Water 1 x Per Week/30 Days Discharge Instructions: Gently cleanse wound with antibacterial soap, rinse and pat dry prior to dressing wounds Cleanser: Vashe 5.8 (oz) 1 x Per Week/30 Days Discharge Instructions: Use vashe 5.8 (oz) as directed Prim Dressing: Silvercel 4 1/4x 4 1/4 (in/in) 1 x Per Week/30 Days ary Discharge Instructions: Apply Silvercel 4 1/4x 4 1/4 (in/in) as instructed Secondary Dressing: Zetuvit Plus 4x8 (in/in) 1 x Per Week/30 Days Com pression Wrap: Urgo K2, two layer compression system, large 1 x Per Week/30 Days 1. I change the primary dressing to silver alginate #1 wound infection. Empiric doxycycline 100 twice daily while we wait for culture. 2. Suspect a staph infection 3. I change the primary dressing to silver alginate. I am still going to have to wrap this leg otherwise I risk uncontrolled edema with further breakdown. 4. I am going to bring her in for a nurse visit on Friday to change the dressing otherwise we will see her again next week. 5. Cautioned her that if she develops systemic symptoms fever chills or uncontrolled pain she may need to seek more urgent medical attention Electronic Signature(s) Signed: 09/13/2023 3:33:32 PM By: Baltazar Najjar MD Entered By: Baltazar Najjar on 09/12/2023 06:28:53 -------------------------------------------------------------------------------- SuperBill Details Patient Name: Date of Service: [name], Raea [name]. 09/12/2023 Medical Record Number: [id number] Patient Account Number: [id number] Date of Birth/Sex: Treating  RN: [birth year] (42 y.o. Adda, Hund, [name] ([id number]) [id number] Page 9 of 9 Primary Care Provider: PA TIENT, NO Other Clinician: Referring Provider: Treating Provider/Extender: [name], [name], Referral Weeks in Treatment: 11 Diagnosis Coding ICD-10 Codes Code Description [phone] Non-pressure chronic ulcer of other part of right lower leg with fat layer exposed I87.311 Chronic venous hypertension (idiopathic) with ulcer of right lower extremity I89.0 Lymphedema, not elsewhere classified L03.115 Cellulitis of right lower limb Facility Procedures : CPT4 Code: [id number] Description: (Facility Use Only) 84696EX - APPLY MULTLAY COMPRS LWR RT LEG Modifier: Quantity: 1 Physician Procedures : CPT4 Code Description Modifier [id number] - WC PHYS LEVEL 4 - EST PT ICD-10 Diagnosis Description L97.812 Non-pressure chronic ulcer of other part of right lower leg with fat layer exposed I89.0 Lymphedema, not elsewhere classified L03.115  Cellulitis of right lower limb Quantity: 1 Electronic Signature(s) Signed: 09/13/2023 3:33:32 PM By: Baltazar Najjar MD Entered By: Baltazar Najjar on 09/12/2023 06:29:18

## 2023-09-13 NOTE — Progress Notes (Signed)
[name] ([id number]) [id number] Page 1 of 8 Visit Report for 09/12/2023 Arrival Information Details Patient Name: Date of Service: [name], [name] 09/12/2023 8:30 A M Medical Record Number: [id number] Patient Account Number: [id number] Date of Birth/Sex: Treating RN: 1981-04-24 (42 y.o. [name] Primary Care Buddie Marston: PA Zenovia Jordan, NO Other Clinician: Referring Blakelee Allington: Treating Macayla Ekdahl/Extender: [name], [name], Referral Weeks in Treatment: 11 Visit Information History Since Last Visit Added or deleted any medications: No Patient Arrived: Ambulatory Any new allergies or adverse reactions: No Arrival Time: 08:52 Has Dressing in Place as Prescribed: Yes Accompanied By: self Has Compression in Place as Prescribed: Yes Transfer Assistance: None Pain Present Now: No Patient Requires Transmission-Based Precautions: No Patient Has Alerts: No Electronic Signature(s) Signed: 09/13/2023 4:52:24 PM By: Midge Aver MSN RN CNS WTA Entered By: Midge Aver on 09/12/2023 05:56:47 -------------------------------------------------------------------------------- Clinic Level of Care Assessment Details Patient Name: Date of Service: [facility], [name] 09/12/2023 8:30 A M Medical Record Number: [id number] Patient Account Number: [id number] Date of Birth/Sex: Treating RN: 11-29-1980 (42 y.o. [name] Primary Care Sinclair Arrazola: PA Zenovia Jordan, NO Other Clinician: Referring Laurren Lepkowski: Treating Azie Mcconahy/Extender: [name], [name], Referral Weeks in Treatment: 11 Clinic Level of Care Assessment Items TOOL 1 Quantity Score []  - 0 Use when EandM and Procedure is performed on INITIAL visit ASSESSMENTS - Nursing Assessment / Reassessment []  - 0 General Physical Exam (combine w/ comprehensive assessment (listed just below) when performed on new pt. evals) []  - 0 Comprehensive Assessment (HX, ROS, Risk Assessments, Wounds Hx, etc.) ASSESSMENTS -  Wound and Skin Assessment / Reassessment []  - 0 Dermatologic / Skin Assessment (not related to wound area) ASSESSMENTS - Ostomy and/or Continence Assessment and Care []  - 0 Incontinence Assessment and Management []  - 0 Ostomy Care Assessment and Management (repouching, etc.) [name], [name] ([id number]) [id number] Page 2 of 8 PROCESS - Coordination of Care []  - 0 Simple Patient / Family Education for ongoing care []  - 0 Complex (extensive) Patient / Family Education for ongoing care []  - 0 Staff obtains Chiropractor, Records, T Results / Process Orders est []  - 0 Staff telephones HHA, Nursing Homes / Clarify orders / etc []  - 0 Routine Transfer to another Facility (non-emergent condition) []  - 0 Routine Hospital Admission (non-emergent condition) []  - 0 New Admissions / Manufacturing engineer / Ordering NPWT Apligraf, etc. , []  - 0 Emergency Hospital Admission (emergent condition) PROCESS - Special Needs []  - 0 Pediatric / Minor Patient Management []  - 0 Isolation Patient Management []  - 0 Hearing / Language / Visual special needs []  - 0 Assessment of Community assistance (transportation, D/C planning, etc.) []  - 0 Additional assistance / Altered mentation []  - 0 Support Surface(s) Assessment (bed, cushion, seat, etc.) INTERVENTIONS - Miscellaneous []  - 0 External ear exam []  - 0 Patient Transfer (multiple staff / Nurse, adult / Similar devices) []  - 0 Simple Staple / Suture removal (25 or less) []  - 0 Complex Staple / Suture removal (26 or more) []  - 0 Hypo/Hyperglycemic Management (do not check if billed separately) []  - 0 Ankle / Brachial Index (ABI) - do not check if billed separately Has the patient been seen at the hospital within the last three years: Yes Total Score: 0 Level Of Care: ____ Electronic Signature(s) Signed: 09/13/2023 4:52:24 PM By: Midge Aver MSN RN CNS WTA Entered By: Midge Aver on 09/12/2023  06:18:40 -------------------------------------------------------------------------------- Compression Therapy Details Patient Name: Date of Service:  [name], [name] 09/12/2023 8:30 A M Medical Record Number: [id number] Patient Account Number: [id number] Date of Birth/Sex: Treating RN: [birth year] (42 y.o. [name] Primary Care Shandale Malak: PA Zenovia Jordan, NO Other Clinician: Referring Genae Strine: Treating Deisi Salonga/Extender: [name], [name], Referral Weeks in Treatment: 11 Compression Therapy Performed for Wound Assessment: Wound #7 Right,Lateral Lower Leg Performed By: Clinician Midge Aver, RN Compression Type: Four Layer Post Procedure Diagnosis Same as [name] ([id number]) [id number] Page 3 of 8 Electronic Signature(s) Signed: 09/13/2023 4:52:24 PM By: Midge Aver MSN RN CNS WTA Entered By: Midge Aver on 09/12/2023 06:17:28 -------------------------------------------------------------------------------- Lower Extremity Assessment Details Patient Name: Date of Service: [facility], [name] 09/12/2023 8:30 A M Medical Record Number: [id number] Patient Account Number: [id number] Date of Birth/Sex: Treating RN: Nov 09, 1981 (42 y.o. [name] Primary Care Wiley Magan: PA TIENT, NO Other Clinician: Referring Huntley Knoop: Treating Gregorey Nabor/Extender: [name], [name], Referral Weeks in Treatment: 11 Edema Assessment Assessed: [Left: No] [Right: No] Edema: [Left: Ye] [Right: s] Calf Left: Right: Point of Measurement: 38 cm From Medial Instep 57 cm Ankle Left: Right: Point of Measurement: 13 cm From Medial Instep 34 cm Vascular Assessment Pulses: Dorsalis Pedis Palpable: [Right:Yes] Extremity colors, hair growth, and conditions: Extremity Color: [Right:Hyperpigmented] Hair Growth on Extremity: [Right:No] Temperature of Extremity: [Right:Warm] Capillary Refill: [Right:< 3 seconds] Dependent Rubor:  [Right:No] Blanched when Elevated: [Right:No No] Toe Nail Assessment Left: Right: Thick: Yes Discolored: Yes Deformed: Yes Improper Length and Hygiene: Yes Electronic Signature(s) Signed: 09/13/2023 4:52:24 PM By: Midge Aver MSN RN CNS WTA Entered By: Midge Aver on 09/12/2023 06:13:53 Metzer, [name] ([id number]) [id number] Page 4 of 8 -------------------------------------------------------------------------------- Multi Wound Chart Details Patient Name: Date of Service: [name], SEIM. 09/12/2023 8:30 A M Medical Record Number: [id number] Patient Account Number: [id number] Date of Birth/Sex: Treating RN: 29-Apr-1981 (42 y.o. [name] Primary Care Lorraine Cimmino: PA TIENT, NO Other Clinician: Referring Ebin Palazzi: Treating Deunta Beneke/Extender: [name], [name], Referral Weeks in Treatment: 11 Vital Signs Height(in): 73 Pulse(bpm): 88 Weight(lbs): 436 Blood Pressure(mmHg): 142/90 Body Mass Index(BMI): 57.5 Temperature(F): 97.8 Respiratory Rate(breaths/min): 18 [7:Photos:] [N/A:N/A] Right, Lateral Lower Leg N/A N/A Wound Location: Gradually Appeared N/A N/A Wounding Event: Venous Leg Ulcer N/A N/A Primary Etiology: Lymphedema, Hypertension N/A N/A Comorbid History: 06/06/2023 N/A N/A Date Acquired: 11 N/A N/A Weeks of Treatment: Open N/A N/A Wound Status: No N/A N/A Wound Recurrence: 2.5x2.3x0.8 N/A N/A Measurements L x W x D (cm) 4.516 N/A N/A A (cm) : rea 3.613 N/A N/A Volume (cm) : -30.70% N/A N/A % Reduction in A rea: -248.40% N/A N/A % Reduction in Volume: 10 Starting Position 1 (o'clock): 6 Ending Position 1 (o'clock): 1.5 Maximum Distance 1 (cm): Yes N/A N/A Undermining: Full Thickness Without Exposed N/A N/A Classification: Support Structures Medium N/A N/A Exudate Amount: Sanguinous N/A N/A Exudate Type: red N/A N/A Exudate Color: Small (1-33%) N/A N/A Granulation Amount: Red, Pink N/A  N/A Granulation Quality: Medium (34-66%) [name]: Fat Layer (Subcutaneous Tissue): Yes N/A N/A Exposed Structures: Fascia: No Tendon: No Muscle: No Joint: No Bone: No None N/A N/A Epithelialization: Treatment Notes Electronic Signature(s) Signed: 09/13/2023 4:52:24 PM By: Midge Aver MSN RN CNS Darene Lamer, [name] ([id number]) [id number] Page 5 of 8 Entered By: Midge Aver on 09/12/2023 06:15:13 -------------------------------------------------------------------------------- Multi-Disciplinary Care Plan Details Patient Name: Date of Service: [name], ILL. 09/12/2023 8:30 A M Medical Record Number: [id number] Patient Account Number:  [id number] Date of Birth/Sex: Treating RN: 05/09/1981 (42 y.o. [name] Primary Care Yasser Hepp: PA Zenovia Jordan, NO Other Clinician: Referring Zayley Arras: Treating Shernell Saldierna/Extender: [name], [name], Referral Weeks in Treatment: 11 Active Inactive Venous Leg Ulcer Nursing Diagnoses: Actual venous Insuffiency (use after diagnosis is confirmed) Knowledge deficit related to disease process and management Goals: Patient will maintain optimal edema control Date Initiated: 06/27/2023 Date Inactivated: 08/01/2023 Target Resolution Date: 07/28/2023 Goal Status: Met Patient/caregiver will verbalize understanding of disease process and disease management Date Initiated: 06/27/2023 Date Inactivated: 08/01/2023 Target Resolution Date: 07/28/2023 Goal Status: Met Verify adequate tissue perfusion prior to therapeutic compression application Date Initiated: 06/27/2023 Target Resolution Date: 09/27/2023 Goal Status: Active Interventions: Assess peripheral edema status every visit. Compression as ordered Provide education on venous insufficiency Treatment Activities: Non-invasive vascular studies : 06/27/2023 T ordered outside of clinic : 06/27/2023 est Therapeutic compression applied : 06/27/2023 Notes: Wound/Skin  Impairment Nursing Diagnoses: Impaired tissue integrity Knowledge deficit related to ulceration/compromised skin integrity Goals: Patient/caregiver will verbalize understanding of skin care regimen Date Initiated: 06/27/2023 Date Inactivated: 08/01/2023 Target Resolution Date: 07/28/2023 Goal Status: Met Ulcer/skin breakdown will have a volume reduction of 30% by week 4 Date Initiated: 06/27/2023 Date Inactivated: 08/01/2023 Target Resolution Date: 07/28/2023 Goal Status: Met Ulcer/skin breakdown will have a volume reduction of 50% by week 8 Date Initiated: 06/27/2023 Date Inactivated: 08/22/2023 Target Resolution Date: 08/27/2023 Goal Status: Met Ulcer/skin breakdown will have a volume reduction of 80% by week 12 Date Initiated: 06/27/2023 Target Resolution Date: 09/27/2023 Goal Status: Active [name], [name] ([id number]) [phone].pdf Page 6 of 8 Interventions: Assess patient/caregiver ability to obtain necessary supplies Assess patient/caregiver ability to perform ulcer/skin care regimen upon admission and as needed Assess ulceration(s) every visit Provide education on ulcer and skin care Treatment Activities: Skin care regimen initiated : 06/27/2023 Notes: Electronic Signature(s) Signed: 09/13/2023 4:52:24 PM By: Midge Aver MSN RN CNS WTA Entered By: Midge Aver on 09/12/2023 06:19:51 -------------------------------------------------------------------------------- Pain Assessment Details Patient Name: Date of Service: [name], [name] 09/12/2023 8:30 A M Medical Record Number: [id number] Patient Account Number: [id number] Date of Birth/Sex: Treating RN: Apr 06, 1981 (42 y.o. [name] Primary Care Talayeh Bruinsma: PA Zenovia Jordan, NO Other Clinician: Referring Iyanna Drummer: Treating Kristyana Notte/Extender: [name], [name], Referral Weeks in Treatment: 11 Active Problems Location of Pain Severity and Description of Pain Patient Has Paino Yes Site Locations Rate the  pain. Current Pain Level: 2 Pain Management and Medication Current Pain Management: Electronic Signature(s) Signed: 09/13/2023 4:52:24 PM By: Midge Aver MSN RN CNS WTA Entered By: Midge Aver on 09/12/2023 06:00:40 [name] ([id number]) [id number] Page 7 of 8 -------------------------------------------------------------------------------- Wound Assessment Details Patient Name: Date of Service: [name], LUSCOMBE. 09/12/2023 8:30 A M Medical Record Number: [id number] Patient Account Number: [id number] Date of Birth/Sex: Treating RN: 09/27/1981 (42 y.o. [name] Primary Care Jamone Garrido: PA TIENT, NO Other Clinician: Referring Traye Bates: Treating Rafael Salway/Extender: [name], [name], Referral Weeks in Treatment: 11 Wound Status Wound Number: 7 Primary Etiology: Venous Leg Ulcer Wound Location: Right, Lateral Lower Leg Wound Status: Open Wounding Event: Gradually Appeared Comorbid History: Lymphedema, Hypertension Date Acquired: 06/06/2023 Weeks Of Treatment: 11 Clustered Wound: No Photos Wound Measurements Length: (cm) 2.5 Width: (cm) 2.3 Depth: (cm) 0.8 Area: (cm) 4.516 Volume: (cm) 3.613 % Reduction in Area: -30.7% % Reduction in Volume: -248.4% Epithelialization: None Undermining: Yes Starting Position (o'clock): 10 Ending Position (o'clock): 6 Maximum Distance: (cm) 1.5 Wound Description Classification:  Full Thickness Without Exposed Suppor Exudate Amount: Medium Exudate Type: Sanguinous Exudate Color: red t Structures Foul Odor After Cleansing: No Slough/Fibrino Yes Wound Bed Granulation Amount: Small (1-33%) Exposed Structure Granulation Quality: Red, Pink Fascia Exposed: No Necrotic Amount: Medium (34-66%) Fat Layer (Subcutaneous Tissue) Exposed: Yes Necrotic Quality: Adherent Slough Tendon Exposed: No Muscle Exposed: No Joint Exposed: No Bone Exposed: No Electronic Signature(s) Signed: 09/13/2023 4:52:24 PM  By: Midge Aver MSN RN CNS WTA Entered By: Midge Aver on 09/12/2023 06:11:23 [name] ([id number]) [id number] Page 8 of 8 -------------------------------------------------------------------------------- Vitals Details Patient Name: Date of Service: [name], MELDE. 09/12/2023 8:30 A M Medical Record Number: [id number] Patient Account Number: [id number] Date of Birth/Sex: Treating RN: 07-04-1981 (42 y.o. [name] Primary Care Apryll Hinkle: PA TIENT, NO Other Clinician: Referring Lashala Laser: Treating Suraj Ramdass/Extender: [name], [name], Referral Weeks in Treatment: 11 Vital Signs Time Taken: 08:57 Temperature (F): 97.8 Height (in): 73 Pulse (bpm): 88 Weight (lbs): 436 Respiratory Rate (breaths/min): 18 Body Mass Index (BMI): 57.5 Blood Pressure (mmHg): 142/90 Reference Range: 80 - 120 mg / dl Electronic Signature(s) Signed: 09/13/2023 4:52:24 PM By: Midge Aver MSN RN CNS WTA Entered By: Midge Aver on 09/12/2023 06:00:26

## 2023-09-14 NOTE — Progress Notes (Signed)
CHARNISE, STEPNEY R ([id number]) [id number] Page 1 of 2 Visit Report for 09/14/2023 Physician Orders Details Patient Name: Date of Service: MAKALEIGH, HAUSCH. 09/14/2023 12:00 PM Medical Record Number: [id number] Patient Account Number: [id number] Date of Birth/Sex: Treating RN: 02/25/1981 (42 y.o. [name] Primary Care Provider: PA Zenovia Jordan, NO Other Clinician: Referring Provider: Treating Provider/Extender: Allen Derry Self, Referral Weeks in Treatment: 11 The following information was scribed by: Midge Aver The information was scribed for: Allen Derry Verbal / Phone Orders: No Diagnosis Coding Follow-up Appointments Return Appointment in 1 week. Nurse Visit as needed Bathing/ Shower/ Hygiene May shower with wound dressing protected with water repellent cover or cast protector. No tub bath. Anesthetic (Use 'Patient Medications' Section for Anesthetic Order Entry) Lidocaine applied to wound bed Wound Treatment Wound #7 - Lower Leg Wound Laterality: Right, Lateral Cleanser: Soap and Water 1 x Per Week/30 Days Discharge Instructions: Gently cleanse wound with antibacterial soap, rinse and pat dry prior to dressing wounds Cleanser: Vashe 5.8 (oz) 1 x Per Week/30 Days Discharge Instructions: Use vashe 5.8 (oz) as directed Prim Dressing: Silvercel 4 1/4x 4 1/4 (in/in) 1 x Per Week/30 Days ary Discharge Instructions: Apply Silvercel 4 1/4x 4 1/4 (in/in) as instructed Secondary Dressing: Zetuvit Plus 4x8 (in/in) 1 x Per Week/30 Days Compression Wrap: Urgo K2, two layer compression system, large 1 x Per Week/30 Days Electronic Signature(s) Signed: 09/14/2023 1:33:06 PM By: Midge Aver MSN RN CNS WTA Signed: 09/14/2023 1:33:54 PM By: Allen Derry PA-C Entered By: Midge Aver on 09/14/2023 10:33:06 Cajuste, Loeta R ([id number]) [id number] Page 2 of  2 -------------------------------------------------------------------------------- SuperBill Details Patient Name: Date of ServiceSHAURICE, JOZWIAK 09/14/2023 Medical Record Number: [id number] Patient Account Number: [id number] Date of Birth/Sex: Treating RN: [birth year] (42 y.o. 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

## 2023-09-14 NOTE — Progress Notes (Signed)
[name] ([id number]) [id number] Page 1 of 3 Visit Report for 09/14/2023 Arrival Information Details Patient Name: Date of Service: [name], [name] 09/14/2023 12:00 PM Medical Record Number: [id number] Patient Account Number: [id number] Date of Birth/Sex: Treating RN: 05/08/1981 (42 y.o. [name] Primary Care [name]: PA [name], NO Other Clinician: Referring [name]: Treating [name]/Extender: [name] Self, Referral Weeks in Treatment: 11 Visit Information History Since Last Visit Added or deleted any medications: No Patient Arrived: Ambulatory Any new allergies or adverse reactions: No Arrival Time: 12:45 Has Dressing in Place as Prescribed: Yes Accompanied By: self Has Compression in Place as Prescribed: Yes Transfer Assistance: None Pain Present Now: No Patient Identification Verified: Yes Secondary Verification Process Completed: Yes Patient Requires Transmission-Based Precautions: No Patient Has Alerts: No Electronic Signature(s) Signed: 09/14/2023 1:31:53 PM By: Jessica Aver MSN RN CNS WTA Entered By: [name] on 09/14/2023 10:31:53 -------------------------------------------------------------------------------- Compression Therapy Details Patient Name: Date of Service: [name], [name] 09/14/2023 12:00 PM Medical Record Number: [id number] Patient Account Number: [id number] Date of Birth/Sex: Treating RN: [birth year] (42 y.o. [name] Primary Care [name]: PA [name], NO Other Clinician: Referring [name]: Treating [name]/Extender: [name] Self, Referral Weeks in Treatment: 11 Compression Therapy Performed for Wound Assessment: Wound #7 Right,Lateral Lower Leg Performed By: Clinician Jessica Aver, RN Compression Type: Four Layer Electronic Signature(s) Signed: 09/14/2023 1:32:36 PM By: Jessica Aver MSN RN CNS WTA Entered By: [name] on 09/14/2023 10:32:36 [name] ([id number])  [id number] Page 2 of 3 -------------------------------------------------------------------------------- Encounter Discharge Information Details Patient Name: Date of Service: [name], [name] 09/14/2023 12:00 PM Medical Record Number: [id number] Patient Account Number: [id number] Date of Birth/Sex: Treating RN: 01/16/1981 (42 y.o. [name] Primary Care Jaxsun Ciampi: PA [name], NO Other Clinician: Referring [name]: Treating [name]/Extender: [name] Self, Referral Weeks in Treatment: 11 Encounter Discharge Information Items Discharge Condition: Stable Ambulatory Status: Ambulatory Discharge Destination: Home Transportation: Private Auto Accompanied By: self Schedule Follow-up Appointment: Yes Clinical Summary of Care: Electronic Signature(s) Signed: 09/14/2023 1:33:39 PM By: Jessica Aver MSN RN CNS WTA Entered By: [name] on 09/14/2023 10:33:39 -------------------------------------------------------------------------------- Wound Assessment Details Patient Name: Date of Service: [name], [name] 09/14/2023 12:00 PM Medical Record Number: [id number] Patient Account Number: [id number] Date of Birth/Sex: Treating RN: [birth year] (42 y.o. [name] Primary Care [name]: PA [name], NO Other Clinician: Referring [name]: Treating [name]/Extender: [name] Self, Referral Weeks in Treatment: 11 Wound Status Wound Number: 7 Primary Etiology: Venous Leg Ulcer Wound Location: Right, Lateral Lower Leg Wound Status: Open Wounding Event: Gradually Appeared Comorbid History: Lymphedema, Hypertension Date Acquired: 06/06/2023 Weeks Of Treatment: 11 Clustered Wound: No Wound Measurements Length: (cm) 2.5 Width: (cm) 2.3 Depth: (cm) 0.8 Area: (cm) 4.516 Volume: (cm) 3.613 % Reduction in Area: -30.7% % Reduction in Volume: -248.4% Epithelialization: None Wound Description Classification: Full Thickness Without Exposed Support [name],  [name] ([id number]) Exudate Amount: Medium Exudate Type: Sanguinous Exudate Color: red Structures Foul Odor After Cleansing: No [id number] Page 3 of 3 Slough/Fibrino Yes Wound Bed Granulation Amount: Small (1-33%) Exposed Structure Granulation Quality: Red, Pink Fascia Exposed: No Necrotic Amount: Medium (34-66%) Fat Layer (Subcutaneous Tissue) Exposed: Yes Necrotic Quality: Adherent Slough Tendon Exposed: No Muscle Exposed: No Joint Exposed: No Bone Exposed: No Treatment Notes Wound #7 (Lower Leg) Wound Laterality: Right, Lateral Cleanser Soap and Water Discharge Instruction: Gently cleanse wound with antibacterial soap, rinse and pat dry prior to dressing wounds Vashe 5.8 (oz) Discharge Instruction: Use vashe 5.8 (oz) as  directed Peri-Wound Care Topical Primary Dressing Silvercel 4 1/4x 4 1/4 (in/in) Discharge Instruction: Apply Silvercel 4 1/4x 4 1/4 (in/in) as instructed Secondary Dressing Zetuvit Plus 4x8 (in/in) Secured With Compression Wrap Urgo K2, two layer compression system, large Compression Stockings Add-Ons Electronic Signature(s) Signed: 09/14/2023 1:32:18 PM By: Jessica Aver MSN RN CNS WTA Entered By: [name] on 09/14/2023 10:32:18

## 2023-09-17 NOTE — Progress Notes (Signed)
Jessica, [name] ([id number]) [id number] Page 1 of 9 Visit Report for 08/29/2023 Arrival Information Details Patient Name: Date of Service: [name], [name] 08/29/2023 9:30 A M Medical Record Number: [id number] Patient Account Number: [id number] Date of Birth/Sex: Treating RN: 1981-06-03 (42 y.o. Freddy Finner Primary Care Vayla Wilhelmi: PA Zenovia Jordan, NO Other Clinician: Referring Teagan Heidrick: Treating Gabe Glace/Extender: [name], [name], Referral Weeks in Treatment: 9 Visit Information History Since Last Visit Added or deleted any medications: No Patient Arrived: Ambulatory Any new allergies or adverse reactions: No Arrival Time: 10:04 Has Dressing in Place as Prescribed: Yes Accompanied By: self Has Compression in Place as Prescribed: Yes Transfer Assistance: None Pain Present Now: No Patient Identification Verified: Yes Secondary Verification Process Completed: Yes Patient Requires Transmission-Based Precautions: No Patient Has Alerts: No Electronic Signature(s) Signed: 09/17/2023 8:01:26 AM By: Yevonne Pax RN Entered By: Yevonne Pax on 08/29/2023 07:04:39 -------------------------------------------------------------------------------- Clinic Level of Care Assessment Details Patient Name: Date of ServiceRASHELL, [name] 08/29/2023 9:30 A M Medical Record Number: [id number] Patient Account Number: [id number] Date of Birth/Sex: Treating RN: 1981/01/03 (42 y.o. Freddy Finner Primary Care Marites Nath: PA Zenovia Jordan, NO Other Clinician: Referring Jhony Antrim: Treating Avanni Turnbaugh/Extender: [name], [name], Referral Weeks in Treatment: 9 Clinic Level of Care Assessment Items TOOL 1 Quantity Score []  - 0 Use when EandM and Procedure is performed on INITIAL visit ASSESSMENTS - Nursing Assessment / Reassessment []  - 0 General Physical Exam (combine w/ comprehensive assessment (listed just below) when performed on new pt. evals) []  - 0 Comprehensive  Assessment (HX, ROS, Risk Assessments, Wounds Hx, etc.) ASSESSMENTS - Wound and Skin Assessment / Reassessment []  - 0 Dermatologic / Skin Assessment (not related to wound area) ASSESSMENTS - Ostomy and/or Continence Assessment and Care Jessica, [name] ([id number]) [id number] Page 2 of 9 []  - 0 Incontinence Assessment and Management []  - 0 Ostomy Care Assessment and Management (repouching, etc.) PROCESS - Coordination of Care []  - 0 Simple Patient / Family Education for ongoing care []  - 0 Complex (extensive) Patient / Family Education for ongoing care []  - 0 Staff obtains Chiropractor, Records, T Results / Process Orders est []  - 0 Staff telephones HHA, Nursing Homes / Clarify orders / etc []  - 0 Routine Transfer to another Facility (non-emergent condition) []  - 0 Routine [name] Admission (non-emergent condition) []  - 0 New Admissions / Manufacturing engineer / Ordering NPWT Apligraf, etc. , []  - 0 Emergency [name] Admission (emergent condition) PROCESS - Special Needs []  - 0 Pediatric / Minor Patient Management []  - 0 Isolation Patient Management []  - 0 Hearing / Language / Visual special needs []  - 0 Assessment of Community assistance (transportation, D/C planning, etc.) []  - 0 Additional assistance / Altered mentation []  - 0 Support Surface(s) Assessment (bed, cushion, seat, etc.) INTERVENTIONS - Miscellaneous []  - 0 External ear exam []  - 0 Patient Transfer (multiple staff / Nurse, adult / Similar devices) []  - 0 Simple Staple / Suture removal (25 or less) []  - 0 Complex Staple / Suture removal (26 or more) []  - 0 Hypo/Hyperglycemic Management (do not check if billed separately) []  - 0 Ankle / Brachial Index (ABI) - do not check if billed separately Has the patient been seen at the [name] within the last three years: Yes Total Score: 0 Level Of Care: ____ Electronic Signature(s) Signed: 09/17/2023 8:01:26 AM By: Yevonne Pax  RN Entered By: Yevonne Pax on 08/29/2023 07:22:51 -------------------------------------------------------------------------------- Compression Therapy Details Patient Name:  Date of Service: [name], [name] 08/29/2023 9:30 A M Medical Record Number: [id number] Patient Account Number: [id number] Date of Birth/Sex: Treating RN: 08-05-1981 (42 y.o. Freddy Finner Primary Care Dasan Hardman: PA Zenovia Jordan, NO Other Clinician: Referring Cheston Coury: Treating Deletha Jaffee/Extender: [name], [name], Referral Weeks in Treatment: 9 Compression Therapy Performed for Wound Assessment: Wound #7 Right,Lateral Lower Leg Performed By: Little Ishikawa, RN Compression TypeLauna Flight Pleas Patricia ([id number]) [id number] Page 3 of 9 Post Procedure Diagnosis Same as Pre-procedure Electronic Signature(s) Signed: 09/17/2023 8:01:26 AM By: Yevonne Pax RN Entered By: Yevonne Pax on 08/29/2023 07:21:55 -------------------------------------------------------------------------------- Encounter Discharge Information Details Patient Name: Date of Service: [name], [name] 08/29/2023 9:30 A M Medical Record Number: [id number] Patient Account Number: [id number] Date of Birth/Sex: Treating RN: [birth year] (42 y.o. Freddy Finner Primary Care Tamarra Geiselman: PA Zenovia Jordan, NO Other Clinician: Referring Jager Koska: Treating Mirtha Jain/Extender: [name], [name], Referral Weeks in Treatment: 9 Encounter Discharge Information Items Post Procedure Vitals Discharge Condition: Stable Temperature (F): 97.8 Ambulatory Status: Ambulatory Pulse (bpm): 82 Discharge Destination: Home Respiratory Rate (breaths/min): 18 Transportation: Private Auto Blood Pressure (mmHg): 125/84 Accompanied By: self Schedule Follow-up Appointment: Yes Clinical Summary of Care: Electronic Signature(s) Signed: 09/17/2023 8:01:26 AM By: Yevonne Pax RN Entered By: Yevonne Pax on 08/29/2023  07:24:40 -------------------------------------------------------------------------------- Lower Extremity Assessment Details Patient Name: Date of Service: [name], [name] 08/29/2023 9:30 A M Medical Record Number: [id number] Patient Account Number: [id number] Date of Birth/Sex: Treating RN: 1981-08-31 (42 y.o. Freddy Finner Primary Care Salvatore Shear: PA Zenovia Jordan, NO Other Clinician: Referring Jerilee Space: Treating Melody Savidge/Extender: [name], [name], Referral Weeks in Treatment: 9 Edema Assessment Assessed: [Left: No] [Right: No] [Left: Edema] [Right: :] Calf Left: RightBETRICE, PASSOS [name] ([id number]) [id number] Page 4 of 9 Point of Measurement: 38 cm From Medial Instep 58 cm Ankle Left: Right: Point of Measurement: 13 cm From Medial Instep 40 cm Vascular Assessment Pulses: Dorsalis Pedis Palpable: [Right:Yes] Extremity colors, hair growth, and conditions: Extremity Color: [Right:Hyperpigmented] Hair Growth on Extremity: [Right:No] Temperature of Extremity: [Right:Warm] Dependent Rubor: [Right:No] Blanched when Elevated: [Right:No No] Toe Nail Assessment Left: Right: Thick: Yes Discolored: Yes Deformed: Yes Improper Length and Hygiene: Yes Electronic Signature(s) Signed: 09/17/2023 8:01:26 AM By: Yevonne Pax RN Entered By: Yevonne Pax on 08/29/2023 07:14:44 -------------------------------------------------------------------------------- Multi Wound Chart Details Patient Name: Date of Service: [name], [name] 08/29/2023 9:30 A M Medical Record Number: [id number] Patient Account Number: [id number] Date of Birth/Sex: Treating RN: 1981-02-05 (42 y.o. Freddy Finner Primary Care Hazelgrace Bonham: PA TIENT, NO Other Clinician: Referring Armonte Tortorella: Treating Sadira Standard/Extender: [name], [name], Referral Weeks in Treatment: 9 Vital Signs Height(in): 73 Pulse(bpm): 82 Weight(lbs): 436 Blood Pressure(mmHg): 125/84 Body Mass Index(BMI):  57.5 Temperature(F): 97.8 Respiratory Rate(breaths/min): 18 [7:Photos:] [N/A:N/A] Right, Lateral Lower Leg N/A N/A Wound Location: Gradually Appeared N/A N/A Wounding EventBYRLE, SUNDERMEYER [name] ([id number]) [id number] Page 5 of 9 Venous Leg Ulcer N/A N/A Primary Etiology: Lymphedema, Hypertension N/A N/A Comorbid History: 06/06/2023 N/A N/A Date Acquired: 9 N/A N/A Weeks of Treatment: Open N/A N/A Wound Status: No N/A N/A Wound Recurrence: 1.5x1.5x0.2 N/A N/A Measurements L x W x D (cm) 1.767 N/A N/A A (cm) : rea 0.353 N/A N/A Volume (cm) : 48.90% N/A N/A % Reduction in A rea: 66.00% N/A N/A % Reduction in Volume: Full Thickness Without Exposed N/A N/A Classification: Support Structures None Present N/A N/A Exudate Amount: Small (1-33%) N/A  N/A Granulation Amount: Red, Pink N/A N/A Granulation Quality: Medium (34-66%) [name]: Fat Layer (Subcutaneous Tissue): Yes N/A N/A Exposed Structures: Fascia: No Tendon: No Muscle: No Joint: No Bone: No None N/A N/A Epithelialization: Treatment Notes Electronic Signature(s) Signed: 09/17/2023 8:01:26 AM By: Yevonne Pax RN Entered By: Yevonne Pax on 08/29/2023 07:18:54 -------------------------------------------------------------------------------- Multi-Disciplinary Care Plan Details Patient Name: Date of Service: [name], [name] 08/29/2023 9:30 A M Medical Record Number: [id number] Patient Account Number: [id number] Date of Birth/Sex: Treating RN: 03/05/1981 (41 y.o. Freddy Finner Primary Care Campbell Kray: PA Zenovia Jordan, NO Other Clinician: Referring Leeanna Slaby: Treating Paulyne Mooty/Extender: [name], [name], Referral Weeks in Treatment: 9 Active Inactive Venous Leg Ulcer Nursing Diagnoses: Actual venous Insuffiency (use after diagnosis is confirmed) Knowledge deficit related to disease process and management Goals: Patient will maintain optimal edema control Date  Initiated: 06/27/2023 Date Inactivated: 08/01/2023 Target Resolution Date: 07/28/2023 Goal Status: Met Patient/caregiver will verbalize understanding of disease process and disease management Date Initiated: 06/27/2023 Date Inactivated: 08/01/2023 Target Resolution Date: 07/28/2023 Goal Status: Met Verify adequate tissue perfusion prior to therapeutic compression application Date Initiated: 06/27/2023 Target Resolution Date: 09/27/2023 Goal Status: Active Interventions: Assess peripheral edema status every visit. MELLISSIA, LONGHENRY [name] ([id number]) [id number] Page 6 of 9 Compression as ordered Provide education on venous insufficiency Treatment Activities: Non-invasive vascular studies : 06/27/2023 T ordered outside of clinic : 06/27/2023 est Therapeutic compression applied : 06/27/2023 Notes: Wound/Skin Impairment Nursing Diagnoses: Impaired tissue integrity Knowledge deficit related to ulceration/compromised skin integrity Goals: Patient/caregiver will verbalize understanding of skin care regimen Date Initiated: 06/27/2023 Date Inactivated: 08/01/2023 Target Resolution Date: 07/28/2023 Goal Status: Met Ulcer/skin breakdown will have a volume reduction of 30% by week 4 Date Initiated: 06/27/2023 Date Inactivated: 08/01/2023 Target Resolution Date: 07/28/2023 Goal Status: Met Ulcer/skin breakdown will have a volume reduction of 50% by week 8 Date Initiated: 06/27/2023 Date Inactivated: 08/22/2023 Target Resolution Date: 08/27/2023 Goal Status: Met Ulcer/skin breakdown will have a volume reduction of 80% by week 12 Date Initiated: 06/27/2023 Target Resolution Date: 09/27/2023 Goal Status: Active Interventions: Assess patient/caregiver ability to obtain necessary supplies Assess patient/caregiver ability to perform ulcer/skin care regimen upon admission and as needed Assess ulceration(s) every visit Provide education on ulcer and skin care Treatment Activities: Skin care regimen  initiated : 06/27/2023 Notes: Electronic Signature(s) Signed: 09/17/2023 8:01:26 AM By: Yevonne Pax RN Entered By: Yevonne Pax on 08/29/2023 07:23:17 -------------------------------------------------------------------------------- Pain Assessment Details Patient Name: Date of Service: [name], [name] 08/29/2023 9:30 A M Medical Record Number: [id number] Patient Account Number: [id number] Date of Birth/Sex: Treating RN: December 29, 1980 (42 y.o. Freddy Finner Primary Care Faelynn Wynder: PA Zenovia Jordan, NO Other Clinician: Referring Norvel Wenker: Treating Lanyah Spengler/Extender: [name], [name], Referral Weeks in Treatment: 9 Active Problems Location of Pain Severity and Description of Pain Patient Has Paino No Site Locations [name], [name] ([id number]) [id number] Page 7 of 9 Pain Management and Medication Current Pain Management: Electronic Signature(s) Signed: 09/17/2023 8:01:26 AM By: Yevonne Pax RN Entered By: Yevonne Pax on 08/29/2023 07:09:08 -------------------------------------------------------------------------------- Patient/Caregiver Education Details Patient Name: Date of Service: [name], Rileyann [name]. 10/9/2024andnbsp9:30 A M Medical Record Number: [id number] Patient Account Number: [id number] Date of Birth/Gender: Treating RN: 01-10-1981 (42 y.o. Freddy Finner Primary Care Physician: PA Zenovia Jordan, NO Other Clinician: Referring Physician: Treating Physician/Extender: [name], [name], Referral Weeks in Treatment: 9 Education Assessment Education Provided To: Patient Education Topics Provided Wound Debridement: Handouts: Wound Debridement Methods: Explain/Verbal  Responses: State content correctly Wound/Skin Impairment: Handouts: Caring for Your Ulcer Methods: Explain/Verbal Responses: State content correctly Electronic Signature(s) Signed: 09/17/2023 8:01:26 AM By: Yevonne Pax RN Entered By: Yevonne Pax on 08/29/2023 07:23:34 Lauture, Jenesys  [name] ([id number]) [id number] Page 8 of 9 -------------------------------------------------------------------------------- Wound Assessment Details Patient Name: Date of Service: [name], [name] 08/29/2023 9:30 A M Medical Record Number: [id number] Patient Account Number: [id number] Date of Birth/Sex: Treating RN: [birth year] (42 y.o. Freddy Finner Primary Care Kashius Dominic: PA TIENT, NO Other Clinician: Referring Sherlin Sonier: Treating Omarrion Carmer/Extender: [name], [name], Referral Weeks in Treatment: 9 Wound Status Wound Number: 7 Primary Etiology: Venous Leg Ulcer Wound Location: Right, Lateral Lower Leg Wound Status: Open Wounding Event: Gradually Appeared Comorbid History: Lymphedema, Hypertension Date Acquired: 06/06/2023 Weeks Of Treatment: 9 Clustered Wound: No Photos Wound Measurements Length: (cm) 1.5 Width: (cm) 1.5 Depth: (cm) 0.2 Area: (cm) 1.767 Volume: (cm) 0.353 % Reduction in Area: 48.9% % Reduction in Volume: 66% Epithelialization: None Wound Description Classification: Full Thickness Without Exposed Suppor Exudate Amount: None Present t Structures Foul Odor After Cleansing: No Slough/Fibrino Yes Wound Bed Granulation Amount: Small (1-33%) Exposed Structure Granulation Quality: Red, Pink Fascia Exposed: No Necrotic Amount: Medium (34-66%) Fat Layer (Subcutaneous Tissue) Exposed: Yes Necrotic Quality: Adherent Slough Tendon Exposed: No Muscle Exposed: No Joint Exposed: No Bone Exposed: No Electronic Signature(s) Signed: 09/17/2023 8:01:26 AM By: Yevonne Pax RN Entered By: Yevonne Pax on 08/29/2023 07:14:08 [name] ([id number]) [id number] Page 9 of 9 -------------------------------------------------------------------------------- Vitals Details Patient Name: Date of Service: [name], [name] 08/29/2023 9:30 A M Medical Record Number: [id number] Patient Account Number: [id number] Date of  Birth/Sex: Treating RN: 1981-03-10 (42 y.o. Freddy Finner Primary Care Ramiya Delahunty: PA TIENT, NO Other Clinician: Referring Tyric Rodeheaver: Treating Jimmi Sidener/Extender: [name], [name], Referral Weeks in Treatment: 9 Vital Signs Time Taken: 10:08 Temperature (F): 97.8 Height (in): 73 Pulse (bpm): 82 Weight (lbs): 436 Respiratory Rate (breaths/min): 18 Body Mass Index (BMI): 57.5 Blood Pressure (mmHg): 125/84 Reference Range: 80 - 120 mg / dl Electronic Signature(s) Signed: 09/17/2023 8:01:26 AM By: Yevonne Pax RN Entered By: Yevonne Pax on 08/29/2023 07:09:01

## 2023-09-17 NOTE — Progress Notes (Signed)
[name], [name] ([id number]) [id number] Page 1 of 10 Visit Report for 08/29/2023 Debridement Details Patient Name: Date of Service: [name], [name] 08/29/2023 9:30 A M Medical Record Number: [id number] Patient Account Number: [id number] Date of Birth/Sex: Treating RN: 1980/12/03 (42 y.o. Freddy Finner Primary Care Provider: PA Zenovia Jordan, NO Other Clinician: Referring Provider: Treating Provider/Extender: [name], [name], Referral Weeks in Treatment: 9 Debridement Performed for Assessment: Wound #7 Right,Lateral Lower Leg Performed By: Physician Maxwell Caul, MD Debridement Type: Debridement Severity of Tissue Pre Debridement: Fat layer exposed Level of Consciousness (Pre-procedure): Awake and Alert Pre-procedure Verification/Time Out Yes - 10:20 Taken: Start Time: 10:20 Pain Control: Lidocaine 4% T opical Solution Percent of Wound Bed Debrided: 100% T Area Debrided (cm): otal 1.77 Tissue and other material debrided: Viable, Non-Viable, Slough, Subcutaneous, Slough Level: Skin/Subcutaneous Tissue Debridement Description: Excisional Instrument: Curette Bleeding: Moderate Hemostasis Achieved: Pressure Procedural Pain: 0 Post Procedural Pain: 0 Response to Treatment: Procedure was tolerated well Level of Consciousness (Post- Awake and Alert procedure): Post Debridement Measurements of Total Wound Length: (cm) 1.5 Width: (cm) 1.5 Depth: (cm) 0.2 Volume: (cm) 0.353 Character of Wound/Ulcer Post Debridement: Stable Severity of Tissue Post Debridement: Fat layer exposed Post Procedure Diagnosis Same as Pre-procedure Electronic Signature(s) Signed: 08/29/2023 4:50:17 PM By: Baltazar Najjar MD Signed: 09/17/2023 8:01:26 AM By: Yevonne Pax RN Entered By: Yevonne Pax on 08/29/2023 10:21:34 [name] ([id number]) [id number] Page 2 of  10 -------------------------------------------------------------------------------- HPI Details Patient Name: Date [facility] Service[name] [name] 08/29/2023 9:30 A M Medical Record Number: [id number] Patient Account Number: [id number] Date of Birth/Sex: Treating RN: 22-Jan-1981 (42 y.o. [name] Primary Care Provider: PA TIENT, NO Other Clinician: Referring Provider: Treating Provider/Extender: [name], [name], Referral Weeks in Treatment: 9 History of Present Illness HPI Description: 42 year old patient well known to our [facility] wound care [name] where she has been seen since 2016 for bilateral lower extremity venous insufficiency disease with lymphedema and multiple ulcerations associated with morbid obesity. she had custom-made compression stockings and lymphedema pumps which were used in the past. most recently she was admitted to the hospital between October 11 and 09/02/2017 with sepsis, lower extremity wounds and lymphedema.she was initially treated in the outpatient with Keflex and Bactrim. she was initially treated in the hospital with vancomycin and Zosyn and changed over to Unasyn until her white count improved and her blood cultures were negative for 3 days. After her inpatient management she was discharged home on Augmentin to end on 09/13/2017 with a 14 day course. she has had outpatient vascular duplex scans completed in November 2017 and her right ABI was 1.1 and the left ABI is 1.3. she had normal toe brachial indices bilaterally.she had three-vessel runoff in the right lower extremity and two-vessel runoff in the left lower extremity. On questioning the patient she does have custom made compression stockings and also has a lymphedema pump but has not been using it appropriately and has not been taking good care of herself. 09/17/2017 -- she returns today with compression stockings on the left side and the right side has had significant amount of drainage and  has a very strong odor 09/24/2017 -- the drainage is increased significantly and she has more lymphedema and a very strong odor to her wound. Though she does not have systemic symptoms, or overt infection I believe she will benefit from some doxycycline given empirically. 10/01/2017 -- after starting the doxycycline and changing the  dressing twice a week her symptoms and signs have definitely improved overall. 10/08/2017 -- she has completed her course of doxycycline but continues to have a lot of drainage and needs twice a week dressing changes. 11/08/17-she is here in follow-up evaluation for right lower extremity ulcers. She admits to using her lymphedema pumps twice daily, one hour per session. she is voicing no complaints or concerns, no signs of infection will change to [facility] 12/14/17 on evaluation today patient appears to be doing very well in regard to her wounds. She has been tolerating the dressing changes she continues to develop some portly the adherent granular tissue on the surface of the wound with some Slough. Obviously we are trying to get too much better wound bed. With that being said the hyper granulation the Hydrofera Blue Dressing to have helped with which is excellent news. However I think it may be time to try something a little bit different at this point. 01/11/18 on evaluation today patient appears to be doing fairly well in regard to her right lateral lower extremity ulcers. This shows excellent signs of filling in which is great news. There does not appear to be any evidence of infection which is also good news. She does continue to work as well is good school. She is having no pain. 01/22/18 on evaluation today patient appears to be doing a little bit worse in my opinion in regard to the overall quality of that granulation on her right lower extremity. She was not here last week due to being sick with a stomach virus this may have something to do with the fact that her  wound appears to be a little bit worse. With that being said I'm also thinking that after switching from the [facility] Dressing to the silver collagen would really has not looked that's good in my opinion. We may want to swit 02/11/18 on evaluation today patient's right lower/lateral lower extremity ulcers appear to be doing very well at this point. Especially the more proximal ulcer has filled in much closer to surface which is good news. Nonetheless both show signs of improvement which is great news. There does not appear to be any evidence of infection which is also good news. In general patient has been doing well tolerating the wraps as well as the [employer]. 02/18/18 on evaluation today patient appears to be doing a little bit worse in regard to the periwound region the wounds themselves do not look much deteriorated to me. With that being said she has several small blisters/pustules noted in the periwound and there was a significant amount of drainage and maceration compared to previous. There has been a time that we had to bring her back for twice a week dressing changes as far as her wrap was concerned it has been a while since we've done that however. With that being said the patient has been having some burning and in general I'm concerned about the possibility of infection. She has previously taken doxycycline with good result. Fortunately there does not appear to be any evidence of overall worsening in regard to the size of the wound and in fact the upper wound actually appears to be showing signs of good epithelialization. 03/18/18 on evaluation today patient appears to be doing excellent in regard to her right lateral lower extremity ulcer. She has been tolerating the dressing changes without complication. Fortunately this seems to be making great progress. Overall I see no signs of infection and there is  dramatic improvement overall even compared to last week. 03/28/18 on  evaluation today patient appears to be doing very well in regard to her right lateral lower surety ulcer. She has been tolerating the dressing changes without complication at this point. She states currently that she's having no significant discomfort which is excellent as well. Overall I'm pleased with how things seem to be progressing. 04/01/18 on evaluation today patient appears to be doing excellent in regard to her right lateral lower extremity ulcers. She has been tolerating the dressing changes without complication which is good news. With that being said the wraps still continues to show signs of helping with her fluid she does have Juxta-Lite compression stockings for when we are done with the current treatment regimen once everything heals. Mainly she just has the one area still remaining the smaller of the two wings is pretty much closed at this point there's just a very slight opening noted. JONELL, BARGIEL [name] ([id number]) [id number] Page 3 of 10 04/08/18 on evaluation today patient appears to be doing better in regard to the original wound on the right lateral lower extremity that we have been managing. Unfortunately she has a new area of weeping more anterior on the right lateral lower extremity and on the left lower extremity she has two new ulcers there appears to be some cellulitis noted at this time. I am concerned about the fact that this may in fact be an infection that has caused the worsening and swelling in the past this has been the case when we previously attempted to determine what was going on when she had down slides like this. With that being said the patient is seeming to tolerate the wraps fairly well for the most part. 04/17/18; since last time the patient was seen in this [name] she was hospitalized from 04/11/18 through 04/13/18; she presented with bilateral lower extremity pain worse on the right and a fever of up to 104. Noteworthy that when she  was in the [name] last week she had new wounds on the left leg culture grew MRSA and she was prescribed Bactrim. She had 2 days of IV Vanco and Zosyn in the hospital. Her blood cultures were negative. She was discharged on Bactrim to cover the original MRSA on the right leg and Keflex to cover the possibility of strep. The hospitalist had a conversation with infectious disease. The patient arrives in [name] today for nurse check however given the recent hospitalization I was asked to look at her. The patient states she feels a lot better. No fever or chills. Still some pain in the right calf but a lot better. She arrived in the hospital with a white count of 15.6, the next day was 10.8. Comprehensive metabolic panel was normal. She is still taking Keflex and Bactrim It would appear that she had a surgical IandD at the bedside of the right calf felt to have a underlying abscess. According to our intake nurse the wound has expanded quite bit on the right lateral calf. She has no open area on the left anterior and left posterior calf as described last time 04/23/18 on evaluation today patient actually appears to be doing much better than when I last saw her. This obviously has been a couple weeks ago and in the interim she was admitted to the hospital for IV antibiotic therapy due to cellulitis, discharge, and fortunately the substance which hadn't sued has completely resolved. Her swelling seems to be better in regard to her lower  extremities as well and the wounds that opened up as results of the infection seems to be showing signs of improvement. There is some Slough noted on the left lateral wound otherwise a lot of weeping noted of the right lateral leg. 04/29/18 on evaluation today patient appears to be doing excellent in regard to her bilateral lower extremity ulcers. She's been tolerating the dressing changes without complication there does not appear to be any evidence of infection at this time.  Overall I think she is made great improvement and seems to be showing signs of coming back around where she was prior to the cellulitis and sepsis episode. 05/06/18 on evaluation today patient appears to be doing better in regard to her bilateral ocean the ulcers. She's been tolerating the dressing changes without complication. Fortunately there does not appear to be any evidence of infection at this time. Her swelling is significantly down overall seems to be showing signs of good improvement as well. Fortunately I do believe that she is progressing nicely back to where she was prior to the cellulitis/sepsis issue. 05/13/18 on evaluation today patient seems to be showing signs of great improvement she's been tolerating the dressing changes without complication and overall there does not appear to be any evidence of infection. All of which is good news. The only issue she really have today is that some of the [facility] Dressing actually was stuck to the periwound of the right lateral lower extremity however this was able to be removed during the debridement without complication. 05/21/18 on evaluation today patient appears to be doing very well in regard to her bilateral lower extremity ulcers. She has been tolerating the compression wraps without complication. There does not appear to be any evidence of infection at this point which is good news as well. Overall I'm very pleased with the progress that has been made. She likewise is very happy. 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 05/28/18 on evaluation today patient actually appears to be doing excellent in regard to her bilateral lower [place] ulcers. She's made great progress since last week and overall I'm very pleased in this regard. She states that she's not have any discomfort either which is also good news there's definitely  no evidence of infection. 06/03/18 on evaluation today patient actually appears to be doing excellent in regard to her ulcerations in fact these areas on both lower extremities were almost completely healed. Unfortunately she has a situation going on her life right now she actually found her husband deceased last October 25, 2023. She states that she has been quite a bit upset since that time unfortunately this is obviously a very big blow to her. Nonetheless she is concerned about the funeral and having to wear certain shoes for this that she states she cannot fit in with the wraps. She wonders if she could potentially come back on Oct 25, 2023 to have her wraps changed and to switch into her Juxta-Lite compression at that point. I think that would be an okay thing that we could do for her. Nonetheless she fortunately seems to be tolerating the wraps very well and again has made excellent progress with the [facility] Dressing 06/11/18 on evaluation today patient appears to be doing rather well in regard to her bilateral lower extremities in fact everything appears to be completely healed at this point which is excellent news. There does not appear to  be any evidence of infection at this time which is great. In fact overall I do believe she is completely resolved in regard to her bilateral lower extremity ulcers. She is extremely happy in this regard. 06/27/2023 Ms. Curtrina Suh is a 42 year old female with a past medical history of venous insufficiency and lymphedema that presents to the [name] for a 3-week history of nonhealing wound to the right lower extremity. She states this started spontaneously. She does not wear compression stockings. She has been keeping the area covered. She currently denies signs of infection. 8/14; patient readmitted to [name] last week. She has venous insufficiency and chronic secondary lymphedema. She has a nonhealing wound on the right lateral lower leg. She has been using gent  mupirocin and Hydrofera Blue using an Urgo K2 lite wrap. Her ABI on the right leg was 0.74 on admission to [name]. Formal arterial studies were ordered but we have not yet had any contact with the patient. Also there was some question about juxta lites for this patient we have not heard anything about that either. 8/28; patient presents for follow-up. She had ABIs completed on 8/23 that showed noncompressible ABIs on the right and left with TBI of 0.88 on the left and 0.96 on the right. She had triphasic waveforms throughout the arterial system. This is reassuring that she has adequate blood flow for healing. We have been using antibiotic ointment with Hydrofera Blue under Urgo K2 lite wrap. She has tolerated this well. Wound is smaller. 9/4; patient presents for follow-up. We have been using Urgo 2 regular4-layer equivalent as well as Hydrofera Blue and antibiotic ointment. Wound is slightly smaller. She has no issues or complaints. She tolerated the wrap well. 9/11; patient presents for follow-up. We have been using Hydrofera Blue with antibiotic ointment under Urgo regular compression. She states the wrap slid down on Sunday and she has been keeping the area covered for the past 2 to 3 days. 9/18; small wound on the right lateral lower leg this is in the setting of significant lymphedema. We have you been using gent and mupirocin with Hydrofera Blue and Urgo K2 regular compression. The wound is about the same size this week. At 1 point she got wraparound stockings/external compression stockings during her time in our [place] sister [name]. I am not sure she is actually been wearing these in fact I think not 9/25; right lateral leg in the setting of very significant stage III lymphedema. We have been using gent and mupirocin under Hydrofera Blue and using an Urgo K2 lite. She works in a daycare 10/2; right lateral leg in the setting of stage III lymphedema which is marginally controlled and  spite of 4-layer equivalent compression with Urgo K2 we have been using gent and mupirocin with Hydrofera Blue, not making much progress however in the small circular wound with a very fibrinous surface. 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 She tells [place] a pipe burst in her apartment she got the wrap wet while cleaning up took it off 10/9; right lateral lower leg in the setting of stage III lymphedema. Small punched-out area but with a continuously nonviable surface. I changed to her to Iodoflex last week to help with the surface although she still comes in with a lot of fibrinous debris Cardinal, Ethan [name] ([id number]) [id number] Page 4 of 10 Electronic Signature(s) Signed: 08/29/2023 4:50:17 PM By: Baltazar Najjar MD Entered By: Baltazar Najjar on 08/29/2023 10:48:33 -------------------------------------------------------------------------------- Physical Exam Details Patient Name: Date  of Service: [name], [name] 08/29/2023 9:30 A M Medical Record Number: [id number] Patient Account Number: [id number] Date of Birth/Sex: Treating RN: 09-May-1981 (42 y.o. [name] Primary Care Provider: PA Zenovia Jordan, NO Other Clinician: Referring Provider: Treating Provider/Extender: [name], [name], Referral Weeks in Treatment: 9 Constitutional Sitting or standing Blood Pressure is within target range for patient.. Pulse regular and within target range for patient.Marland Kitchen Respirations regular, non-labored and within target range.. Temperature is normal and within the target range for the patient.Marland Kitchen appears in no distress. Notes Wound exam; right lateral lower leg small punched-out wound. Completely nonviable gritty fibrinous surface. I used a #3 curette digging in the subcutaneous tissue to try and remove this although I am still going down millimeters in depth and not getting to his complete a viable granulation surface as I might like. There is no evidence of surrounding  infection. Her lymphedema control is modest at best Electronic Signature(s) Signed: 08/29/2023 4:50:17 PM By: Baltazar Najjar MD Entered By: Baltazar Najjar on 08/29/2023 10:51:36 -------------------------------------------------------------------------------- Physician Orders Details Patient Name: Date of Service: [name], [name] 08/29/2023 9:30 A M Medical Record Number: [id number] Patient Account Number: [id number] Date of Birth/Sex: Treating RN: 1981-07-15 (42 y.o. Freddy Finner Primary Care Provider: PA Zenovia Jordan, NO Other Clinician: Referring Provider: Treating Provider/Extender: [name], [name], Referral Weeks in Treatment: 9 Verbal / Phone Orders: No Diagnosis Coding Follow-up Appointments Return Appointment in 1 week. ppointment in: - nurse visit (one time only) 8/30 Return A Bathing/ Shower/ Hygiene May shower with wound dressing protected with water repellent cover or cast protector. No tub bath. PAMELIA, MCGURL [name] ([id number]) [id number] Page 5 of 10 Anesthetic (Use 'Patient Medications' Section for Anesthetic Order Entry) Lidocaine applied to wound bed Edema Control - Lymphedema / Segmental Compressive Device / Other Optional: One layer of unna paste to top of compression wrap (to act as an anchor). UrgoK2 - Large Wound Treatment Wound #7 - Lower Leg Wound Laterality: Right, Lateral Cleanser: Soap and Water 1 x Per Week/30 Days Discharge Instructions: Gently cleanse wound with antibacterial soap, rinse and pat dry prior to dressing wounds Cleanser: Vashe 5.8 (oz) 1 x Per Week/30 Days Discharge Instructions: Use vashe 5.8 (oz) as directed Prim Dressing: IODOFLEX 0.9% Cadexomer Iodine Pad 1 x Per Week/30 Days ary Discharge Instructions: Apply Iodoflex to wound bed only as directed. Secondary Dressing: ABD Pad 5x9 (in/in) 1 x Per Week/30 Days Discharge Instructions: Cover with ABD pad Compression Wrap: Urgo K2, two layer  compression system, large 1 x Per Week/30 Days Electronic Signature(s) Signed: 08/29/2023 4:50:17 PM By: Baltazar Najjar MD Signed: 09/17/2023 8:01:26 AM By: Yevonne Pax RN Entered By: Yevonne Pax on 08/29/2023 10:22:29 -------------------------------------------------------------------------------- Problem List Details Patient Name: Date of Service: [name], [name] 08/29/2023 9:30 A M Medical Record Number: [id number] Patient Account Number: [id number] Date of Birth/Sex: Treating RN: [birth year] (42 y.o. [name] Primary Care Provider: PA TIENT, NO Other Clinician: Referring Provider: Treating Provider/Extender: [name], [name], Referral Weeks in Treatment: 9 Active Problems ICD-10 Encounter Code Description Active Date MDM Diagnosis L97.812 Non-pressure chronic ulcer of other part of right lower leg with fat layer 06/27/2023 No Yes exposed I87.311 Chronic venous hypertension (idiopathic) with ulcer of right lower extremity 06/27/2023 No Yes I89.0 Lymphedema, not elsewhere classified 06/27/2023 No Yes Inactive Problems Resolved Problems [name], [name] ([id number]) [id number] Page 6 of 10 Electronic Signature(s) Signed: 08/29/2023 4:50:17 PM By:  Baltazar Najjar MD Entered By: Baltazar Najjar on 08/29/2023 10:47:13 -------------------------------------------------------------------------------- Progress Note Details Patient Name: Date of Service: Jessica [facility]. 08/29/2023 9:30 A M Medical Record Number: [id number] Patient Account Number: [id number] Date of Birth/Sex: Treating RN: [birth year] (42 y.o. [name] Primary Care Provider: PA TIENT, NO Other Clinician: Referring Provider: Treating Provider/Extender: [name], [name], Referral Weeks in Treatment: 9 Subjective History of Present Illness (HPI) 42 year old patient well known to our [facility] wound care [name] where she has been seen since 2016 for bilateral lower  extremity venous insufficiency disease with lymphedema and multiple ulcerations associated with morbid obesity. she had custom-made compression stockings and lymphedema pumps which were used in the past. most recently she was admitted to the hospital between October 11 and 09/02/2017 with sepsis, lower extremity wounds and lymphedema.she was initially treated in the outpatient with Keflex and Bactrim. she was initially treated in the hospital with vancomycin and Zosyn and changed over to Unasyn until her white count improved and her blood cultures were negative for 3 days. After her inpatient management she was discharged home on Augmentin to end on 09/13/2017 with a 14 day course. she has had outpatient vascular duplex scans completed in November 2017 and her right ABI was 1.1 and the left ABI is 1.3. she had normal toe brachial indices bilaterally.she had three-vessel runoff in the right lower extremity and two-vessel runoff in the left lower extremity. On questioning the patient she does have custom made compression stockings and also has a lymphedema pump but has not been using it appropriately and has not been taking good care of herself. 09/17/2017 -- she returns today with compression stockings on the left side and the right side has had significant amount of drainage and has a very strong odor 09/24/2017 -- the drainage is increased significantly and she has more lymphedema and a very strong odor to her wound. Though she does not have systemic symptoms, or overt infection I believe she will benefit from some doxycycline given empirically. 10/01/2017 -- after starting the doxycycline and changing the dressing twice a week her symptoms and signs have definitely improved overall. 10/08/2017 -- she has completed her course of doxycycline but continues to have a lot of drainage and needs twice a week dressing changes. 11/08/17-she is here in follow-up evaluation for right lower extremity ulcers.  She admits to using her lymphedema pumps twice daily, one hour per session. she is voicing no complaints or concerns, no signs of infection will change to [facility] 12/14/17 on evaluation today patient appears to be doing very well in regard to her wounds. She has been tolerating the dressing changes she continues to develop some portly the adherent granular tissue on the surface of the wound with some Slough. Obviously we are trying to get too much better wound bed. With that being said the hyper granulation the Hydrofera Blue Dressing to have helped with which is excellent news. However I think it may be time to try something a little bit different at this point. 01/11/18 on evaluation today patient appears to be doing fairly well in regard to her right lateral lower extremity ulcers. This shows excellent signs of filling in which is great news. There does not appear to be any evidence of infection which is also good news. She does continue to work as well is good school. She is having no pain. 01/22/18 on evaluation today patient appears to be doing a little bit worse in my  opinion in regard to the overall quality of that granulation on her right lower extremity. She was not here last week due to being sick with a stomach virus this may have something to do with the fact that her wound appears to be a little bit worse. With that being said I'm also thinking that after switching from the [facility] Dressing to the silver collagen would really has not looked that's good in my opinion. We may want to swit 02/11/18 on evaluation today patient's right lower/lateral lower extremity ulcers appear to be doing very well at this point. Especially the more proximal ulcer has filled in much closer to surface which is good news. Nonetheless both show signs of improvement which is great news. There does not appear to be any evidence of infection which is also good news. In general patient has been doing well  tolerating the wraps as well as the [employer]. 02/18/18 on evaluation today patient appears to be doing a little bit worse in regard to the periwound region the wounds themselves do not look much deteriorated to me. With that being said she has several small blisters/pustules noted in the periwound and there was a significant amount of drainage and maceration compared to previous. There has been a time that we had to bring her back for twice a week dressing changes as far as her wrap was concerned it has been a while since we've done that however. With that being said the patient has been having some burning and in general I'm concerned about the possibility of infection. She has previously taken doxycycline with good result. Fortunately there does not appear to be any evidence of overall worsening in regard to the size of the wound and in fact the upper wound actually appears to be showing signs of good epithelialization. 03/18/18 on evaluation today patient appears to be doing excellent in regard to her right lateral lower extremity ulcer. She has been tolerating the dressing changes without complication. Fortunately this seems to be making great progress. Overall I see no signs of infection and there is dramatic improvement overall even compared to last week. [name], [name] ([id number]) [id number] Page 7 of 10 03/28/18 on evaluation today patient appears to be doing very well in regard to her right lateral lower surety ulcer. She has been tolerating the dressing changes without complication at this point. She states currently that she's having no significant discomfort which is excellent as well. Overall I'm pleased with how things seem to be progressing. 04/01/18 on evaluation today patient appears to be doing excellent in regard to her right lateral lower extremity ulcers. She has been tolerating the dressing changes without complication which is good news.  With that being said the wraps still continues to show signs of helping with her fluid she does have Juxta-Lite compression stockings for when we are done with the current treatment regimen once everything heals. Mainly she just has the one area still remaining the smaller of the two wings is pretty much closed at this point there's just a very slight opening noted. 04/08/18 on evaluation today patient appears to be doing better in regard to the original wound on the right lateral lower extremity that we have been managing. Unfortunately she has a new area of weeping more anterior on the right lateral lower extremity and on the left lower extremity she has two new ulcers there appears to be some cellulitis noted at this time. I am concerned about the fact  that this may in fact be an infection that has caused the worsening and swelling in the past this has been the case when we previously attempted to determine what was going on when she had down slides like this. With that being said the patient is seeming to tolerate the wraps fairly well for the most part. 04/17/18; since last time the patient was seen in this [name] she was hospitalized from 04/11/18 through 04/13/18; she presented with bilateral lower extremity pain worse on the right and a fever of up to 104. Noteworthy that when she was in the [name] last week she had new wounds on the left leg culture grew MRSA and she was prescribed Bactrim. She had 2 days of IV Vanco and Zosyn in the hospital. Her blood cultures were negative. She was discharged on Bactrim to cover the original MRSA on the right leg and Keflex to cover the possibility of strep. The hospitalist had a conversation with infectious disease. The patient arrives in [name] today for nurse check however given the recent hospitalization I was asked to look at her. The patient states she feels a lot better. No fever or chills. Still some pain in the right calf but a lot better. She arrived  in the hospital with a white count of 15.6, the next day was 10.8. Comprehensive metabolic panel was normal. She is still taking Keflex and Bactrim It would appear that she had a surgical IandD at the bedside of the right calf felt to have a underlying abscess. According to our intake nurse the wound has expanded quite bit on the right lateral calf. She has no open area on the left anterior and left posterior calf as described last time 04/23/18 on evaluation today patient actually appears to be doing much better than when I last saw her. This obviously has been a couple weeks ago and in the interim she was admitted to the hospital for IV antibiotic therapy due to cellulitis, discharge, and fortunately the substance which hadn't sued has completely resolved. Her swelling seems to be better in regard to her lower extremities as well and the wounds that opened up as results of the infection seems to be showing signs of improvement. There is some Slough noted on the left lateral wound otherwise a lot of weeping noted of the right lateral leg. 04/29/18 on evaluation today patient appears to be doing excellent in regard to her bilateral lower extremity ulcers. She's been tolerating the dressing changes without complication there does not appear to be any evidence of infection at this time. Overall I think she is made great improvement and seems to be showing signs of coming back around where she was prior to the cellulitis and sepsis episode. 05/06/18 on evaluation today patient appears to be doing better in regard to her bilateral ocean the ulcers. She's been tolerating the dressing changes without complication. Fortunately there does not appear to be any evidence of infection at this time. Her swelling is significantly down overall seems to be showing signs of good improvement as well. Fortunately I do believe that she is progressing nicely back to where she was prior to the cellulitis/sepsis issue. 05/13/18  on evaluation today patient seems to be showing signs of great improvement she's been tolerating the dressing changes without complication and overall there does not appear to be any evidence of infection. All of which is good news. The only issue she really have today is that some of the [facility] Dressing actually  was stuck to the periwound of the right lateral lower extremity however this was able to be removed during the debridement without complication. 05/21/18 on evaluation today patient appears to be doing very well in regard to her bilateral lower extremity ulcers. She has been tolerating the compression wraps without complication. There does not appear to be any evidence of infection at this point which is good news as well. Overall I'm very pleased with the progress that has been made. She likewise is very happy. 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 05/28/18 on evaluation today patient actually appears to be doing excellent in regard to her bilateral lower [place] ulcers. She's made great progress since last week and overall I'm very pleased in this regard. She states that she's not have any discomfort either which is also good news there's definitely no evidence of infection. 06/03/18 on evaluation today patient actually appears to be doing excellent in regard to her ulcerations in fact these areas on both lower extremities were almost completely healed. Unfortunately she has a situation going on her life right now she actually found her husband deceased last 10-25-23. She states that she has been quite a bit upset since that time unfortunately this is obviously a very big blow to her. Nonetheless she is concerned about the funeral and having to wear certain shoes for this that she states she cannot fit in with the wraps. She wonders if she could potentially come back on  2023-10-25 to have her wraps changed and to switch into her Juxta-Lite compression at that point. I think that would be an okay thing that we could do for her. Nonetheless she fortunately seems to be tolerating the wraps very well and again has made excellent progress with the [facility] Dressing 06/11/18 on evaluation today patient appears to be doing rather well in regard to her bilateral lower extremities in fact everything appears to be completely healed at this point which is excellent news. There does not appear to be any evidence of infection at this time which is great. In fact overall I do believe she is completely resolved in regard to her bilateral lower extremity ulcers. She is extremely happy in this regard. 06/27/2023 Ms. Lekisha Nobbs is a 42 year old female with a past medical history of venous insufficiency and lymphedema that presents to the [name] for a 3-week history of nonhealing wound to the right lower extremity. She states this started spontaneously. She does not wear compression stockings. She has been keeping the area covered. She currently denies signs of infection. 8/14; patient readmitted to [name] last week. She has venous insufficiency and chronic secondary lymphedema. She has a nonhealing wound on the right lateral lower leg. She has been using gent mupirocin and Hydrofera Blue using an Urgo K2 lite wrap. Her ABI on the right leg was 0.74 on admission to [name]. Formal arterial studies were ordered but we have not yet had any contact with the patient. Also there was some question about juxta lites for this patient we have not heard anything about that either. 8/28; patient presents for follow-up. She had ABIs completed on 8/23 that showed noncompressible ABIs on the right and left with TBI of 0.88 on the left and 0.96 on the right. She had triphasic waveforms throughout the arterial system. This is reassuring that she has adequate blood flow for healing. We have been using  antibiotic  ointment with Hydrofera Blue under Urgo K2 lite wrap. She has tolerated this well. Wound is smaller. 9/4; patient presents for follow-up. We have been using Urgo 2 regular4-layer equivalent as well as Hydrofera Blue and antibiotic ointment. Wound is slightly smaller. She has no issues or complaints. She tolerated the wrap well. 9/11; patient presents for follow-up. We have been using Hydrofera Blue with antibiotic ointment under Urgo regular compression. She states the wrap slid down on Sunday and she has been keeping the area covered for the past 2 to 3 days. 9/18; small wound on the right lateral lower leg this is in the setting of significant lymphedema. We have you been using gent and mupirocin with Hydrofera Blue and Urgo K2 regular compression. The wound is about the same size this week. At 1 point she got wraparound stockings/external compression stockings during her time in our [place] sister [name]. I am not sure she is actually been wearing these in fact I think not [name], [name] ([id number]) [phone].pdf Page 8 of 10 9/25; right lateral leg in the setting of very significant stage III lymphedema. We have been using gent and mupirocin under Hydrofera Blue and using an Urgo K2 lite. 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 She tells [place] a pipe burst in her apartment she got the wrap wet while cleaning up took it off 10/9; right lateral lower leg in the setting of stage III lymphedema. Small punched-out area but with a continuously nonviable surface. I changed to her to Iodoflex last week to help with the surface although she still  comes in with a lot of fibrinous debris Objective Constitutional Sitting or standing Blood Pressure is within target range for patient.. Pulse regular and within target range for patient.Marland Kitchen Respirations regular, non-labored and within target range.. Temperature is normal and within the target range for the patient.Marland Kitchen appears in no distress. Vitals Time Taken: 10:08 AM, Height: 73 in, Weight: 436 lbs, BMI: 57.5, Temperature: 97.8 F, Pulse: 82 bpm, Respiratory Rate: 18 breaths/min, Blood Pressure: 125/84 mmHg. General Notes: Wound exam; right lateral lower leg small punched-out wound. Completely nonviable gritty fibrinous surface. I used a #3 curette digging in the subcutaneous tissue to try and remove this although I am still going down millimeters in depth and not getting to his complete a viable granulation surface as I might like. There is no evidence of surrounding infection. Her lymphedema control is modest at best Integumentary (Hair, Skin) Wound #7 status is Open. Original cause of wound was Gradually Appeared. The date acquired was: 06/06/2023. The wound has been in treatment 9 weeks. The wound is located on the Right,Lateral Lower Leg. The wound measures 1.5cm length x 1.5cm width x 0.2cm depth; 1.767cm^2 area and 0.353cm^3 volume. There is Fat Layer (Subcutaneous Tissue) exposed. There is a none present amount of drainage noted. There is small (1-33%) red, pink granulation within the wound bed. There is a medium (34-66%) amount of necrotic tissue within the wound bed including Adherent Slough. Assessment Active Problems ICD-10 Non-pressure chronic ulcer of other part of right lower leg with fat layer exposed Chronic venous hypertension (idiopathic) with ulcer of right lower extremity Lymphedema, not  elsewhere classified Procedures Wound #7 Pre-procedure diagnosis of Wound #7 is a Venous Leg Ulcer located on the Right,Lateral Lower Leg .Severity of Tissue Pre Debridement is: Fat layer  exposed. There was a Excisional Skin/Subcutaneous Tissue Debridement with a total area of 1.77 sq cm performed by Maxwell Caul, MD. With the following instrument(s): Curette to remove Viable and Non-Viable tissue/material. Material removed includes Subcutaneous Tissue and Slough and after achieving pain control using Lidocaine 4% T opical Solution. No specimens were taken. A time out was conducted at 10:20, prior to the start of the procedure. A Moderate amount of bleeding was controlled with Pressure. The procedure was tolerated well with a pain level of 0 throughout and a pain level of 0 following the procedure. Post Debridement Measurements: 1.5cm length x 1.5cm width x 0.2cm depth; 0.353cm^3 volume. Character of Wound/Ulcer Post Debridement is stable. Severity of Tissue Post Debridement is: Fat layer exposed. Post procedure Diagnosis Wound #7: Same as Pre-Procedure Pre-procedure diagnosis of Wound #7 is a Venous Leg Ulcer located on the Right,Lateral Lower Leg . There was a Four Layer Compression Therapy Procedure by Yevonne Pax, RN. Post procedure Diagnosis Wound #7: Same as Pre-Procedure Plan Follow-up Appointments: Return Appointment in 1 week. Return Appointment in: - nurse visit (one time only) 8/30 Bathing/ Shower/ Hygiene: May shower with wound dressing protected with water repellent cover or cast protector. No tub bath. Anesthetic (Use 'Patient Medications' Section for Anesthetic Order Entry): [name], [name] ([id number]) [id number] Page 9 of 10 Lidocaine applied to wound bed Edema Control - Lymphedema / Segmental Compressive Device / Other: Optional: One layer of unna paste to top of compression wrap (to act as an anchor). UrgoK2 - Large WOUND #7: - Lower Leg Wound Laterality: Right, Lateral Cleanser: Soap and Water 1 x Per Week/30 Days Discharge Instructions: Gently cleanse wound with antibacterial soap, rinse and pat dry prior to  dressing wounds Cleanser: Vashe 5.8 (oz) 1 x Per Week/30 Days Discharge Instructions: Use vashe 5.8 (oz) as directed Prim Dressing: IODOFLEX 0.9% Cadexomer Iodine Pad 1 x Per Week/30 Days ary Discharge Instructions: Apply Iodoflex to wound bed only as directed. Secondary Dressing: ABD Pad 5x9 (in/in) 1 x Per Week/30 Days Discharge Instructions: Cover with ABD pad Com pression Wrap: Urgo K2, two layer compression system, large 1 x Per Week/30 Days 1. Fairly vigorous debridement today with a #3 curette. Hemostasis with direct pressure 2. Continuing Iodoflex to help with the wound surface. 3. We are wrapping her in a full Urgo K2 compression however she had now has some form of insurance possibly Medicaid. Although they will not help with wound care dressings I wonder about compression pumps Electronic Signature(s) Signed: 08/29/2023 4:50:17 PM By: Baltazar Najjar MD Entered By: Baltazar Najjar on 08/29/2023 10:53:09 -------------------------------------------------------------------------------- SuperBill Details Patient Name: Date of Service: [name], Sherryn [name]. 08/29/2023 Medical Record Number: [id number] Patient Account Number: [id number] Date of Birth/Sex: Treating RN: May 04, 1981 (42 y.o. [name] Primary Care Provider: PA TIENT, NO Other Clinician: Referring Provider: Treating Provider/Extender: [name], [name], Referral Weeks in Treatment: 9 Diagnosis Coding ICD-10 Codes Code Description [phone] Non-pressure chronic ulcer of other part of right lower leg with fat layer exposed I87.311 Chronic venous hypertension (idiopathic) with ulcer of right lower extremity I89.0 Lymphedema, not elsewhere classified Facility Procedures : CPT4 Code: [id number] Description: 11042 - DEB SUBQ TISSUE 20 SQ CM/< ICD-10 Diagnosis Description L97.812 Non-pressure chronic ulcer of other part of right lower leg with fat layer  exp I89.0 Lymphedema, not elsewhere classified I87.311 Chronic  venous hypertension  (idiopathic) with ulcer of right lower extremity Modifier: osed Quantity: 1 Physician Procedures : CPT4 Code Description Modifier [id number] 11042 - WC PHYS SUBQ TISS 20 SQ CM ICD-10 Diagnosis Description L97.812 Non-pressure chronic ulcer of other part of right lower leg with fat layer exposed I89.0 Lymphedema, not elsewhere classified I87.311 Chronic  venous hypertension (idiopathic) with ulcer of right lower extremity Boulos, Jamilah [name] ([id number]) [id number] P Quantity: 1 [age] Electronic Signature(s) Signed: 08/29/2023 4:50:17 PM By: Baltazar Najjar MD Entered By: Baltazar Najjar on 08/29/2023 10:53:46

## 2023-09-19 ENCOUNTER — Encounter: Payer: Self-pay | Admitting: Internal Medicine

## 2023-09-20 NOTE — Progress Notes (Signed)
RIMSHA, KLINK [name] ([id number]) [id number] Page 1 of 9 Visit Report for 09/19/2023 HPI Details Patient Name: Date of Service: [name], [name] 09/19/2023 8:30 A M Medical Record Number: [id number] Patient Account Number: [id number] Date of Birth/Sex: Treating RN: 07-07-1981 (42 y.o. [name] Primary Care Provider: PA TIENT, NO Other Clinician: Referring Provider: Treating Provider/Extender: [name], [name], Referral Weeks in Treatment: 12 History of Present Illness HPI Description: 42 year old patient well known to our [facility] wound care clinic where she has been seen since 2016 for bilateral lower extremity venous insufficiency disease with lymphedema and multiple ulcerations associated with morbid obesity. she had custom-made compression stockings and lymphedema pumps which were used in the past. most recently she was admitted to the hospital between October 11 and 09/02/2017 with sepsis, lower extremity wounds and lymphedema.she was initially treated in the outpatient with Keflex and Bactrim. she was initially treated in the hospital with vancomycin and Zosyn and changed over to Unasyn until her white count improved and her blood cultures were negative for 3 days. After her inpatient management she was discharged home on Augmentin to end on 09/13/2017 with a 14 day course. she has had outpatient vascular duplex scans completed in November 2017 and her right ABI was 1.1 and the left ABI is 1.3. she had normal toe brachial indices bilaterally.she had three-vessel runoff in the right lower extremity and two-vessel runoff in the left lower extremity. On questioning the patient she does have custom made compression stockings and also has a lymphedema pump but has not been using it appropriately and has not been taking good care of herself. 09/17/2017 -- she returns today with compression stockings on the left side and the right side has had significant  amount of drainage and has a very strong odor 09/24/2017 -- the drainage is increased significantly and she has more lymphedema and a very strong odor to her wound. Though she does not have systemic symptoms, or overt infection I believe she will benefit from some doxycycline given empirically. 10/01/2017 -- after starting the doxycycline and changing the dressing twice a week her symptoms and signs have definitely improved overall. 10/08/2017 -- she has completed her course of doxycycline but continues to have a lot of drainage and needs twice a week dressing changes. 11/08/17-she is here in follow-up evaluation for right lower extremity ulcers. She admits to using her lymphedema pumps twice daily, one hour per session. she is voicing no complaints or concerns, no signs of infection will change to [facility] 12/14/17 on evaluation today patient appears to be doing very well in regard to her wounds. She has been tolerating the dressing changes she continues to develop some portly the adherent granular tissue on the surface of the wound with some Slough. Obviously we are trying to get too much better wound bed. With that being said the hyper granulation the Hydrofera Blue Dressing to have helped with which is excellent news. However I think it may be time to try something a little bit different at this point. 01/11/18 on evaluation today patient appears to be doing fairly well in regard to her right lateral lower extremity ulcers. This shows excellent signs of filling in which is great news. There does not appear to be any evidence of infection which is also good news. She does continue to work as well is good school. She is having no pain. 01/22/18 on evaluation today patient appears to be doing a little bit worse in my  opinion in regard to the overall quality of that granulation on her right lower extremity. She was not here last week due to being sick with a stomach virus this may have something to do with  the fact that her wound appears to be a little bit worse. With that being said I'm also thinking that after switching from the [facility] Dressing to the silver collagen would really has not looked that's good in my opinion. We may want to swit 02/11/18 on evaluation today patient's right lower/lateral lower extremity ulcers appear to be doing very well at this point. Especially the more proximal ulcer has filled in much closer to surface which is good news. Nonetheless both show signs of improvement which is great news. There does not appear to be any evidence of infection which is also good news. In general patient has been doing well tolerating the wraps as well as the [employer]. 02/18/18 on evaluation today patient appears to be doing a little bit worse in regard to the periwound region the wounds themselves do not look much deteriorated to me. With that being said she has several small blisters/pustules noted in the periwound and there was a significant amount of drainage and maceration compared to previous. There has been a time that we had to bring her back for twice a week dressing changes as far as her wrap was concerned it has been a while since we've done that however. With that being said the patient has been having some burning and in general I'm concerned about the possibility of infection. She has previously taken doxycycline with good result. Fortunately there does not appear to be any evidence of overall worsening in regard to the size of the wound and in fact the upper wound actually appears to be showing signs of good epithelialization. 03/18/18 on evaluation today patient appears to be doing excellent in regard to her right lateral lower extremity ulcer. She has been tolerating the dressing changes without complication. Fortunately this seems to be making great progress. Overall I see no signs of infection and there is dramatic improvement overall even compared to last  week. 03/28/18 on evaluation today patient appears to be doing very well in regard to her right lateral lower surety ulcer. She has been tolerating the dressing changes without complication at this point. She states currently that she's having no significant discomfort which is excellent as well. Overall I'm pleased with how things seem to be progressing. [name], [name] ([id number]) [id number] Page 2 of 9 04/01/18 on evaluation today patient appears to be doing excellent in regard to her right lateral lower extremity ulcers. She has been tolerating the dressing changes without complication which is good news. With that being said the wraps still continues to show signs of helping with her fluid she does have Juxta-Lite compression stockings for when we are done with the current treatment regimen once everything heals. Mainly she just has the one area still remaining the smaller of the two wings is pretty much closed at this point there's just a very slight opening noted. 04/08/18 on evaluation today patient appears to be doing better in regard to the original wound on the right lateral lower extremity that we have been managing. Unfortunately she has a new area of weeping more anterior on the right lateral lower extremity and on the left lower extremity she has two new ulcers there appears to be some cellulitis noted at this time. I am concerned about the fact  that this may in fact be an infection that has caused the worsening and swelling in the past this has been the case when we previously attempted to determine what was going on when she had down slides like this. With that being said the patient is seeming to tolerate the wraps fairly well for the most part. 04/17/18; since last time the patient was seen in this clinic she was hospitalized from 04/11/18 through 04/13/18; she presented with bilateral lower extremity pain worse on the right and a fever of up to 104. Noteworthy  that when she was in the clinic last week she had new wounds on the left leg culture grew MRSA and she was prescribed Bactrim. She had 2 days of IV Vanco and Zosyn in the hospital. Her blood cultures were negative. She was discharged on Bactrim to cover the original MRSA on the right leg and Keflex to cover the possibility of strep. The hospitalist had a conversation with infectious disease. The patient arrives in clinic today for nurse check however given the recent hospitalization I was asked to look at her. The patient states she feels a lot better. No fever or chills. Still some pain in the right calf but a lot better. She arrived in the hospital with a white count of 15.6, the next day was 10.8. Comprehensive metabolic panel was normal. She is still taking Keflex and Bactrim It would appear that she had a surgical IandD at the bedside of the right calf felt to have a underlying abscess. According to our intake nurse the wound has expanded quite bit on the right lateral calf. She has no open area on the left anterior and left posterior calf as described last time 04/23/18 on evaluation today patient actually appears to be doing much better than when I last saw her. This obviously has been a couple weeks ago and in the interim she was admitted to the hospital for IV antibiotic therapy due to cellulitis, discharge, and fortunately the substance which hadn't sued has completely resolved. Her swelling seems to be better in regard to her lower extremities as well and the wounds that opened up as results of the infection seems to be showing signs of improvement. There is some Slough noted on the left lateral wound otherwise a lot of weeping noted of the right lateral leg. 04/29/18 on evaluation today patient appears to be doing excellent in regard to her bilateral lower extremity ulcers. She's been tolerating the dressing changes without complication there does not appear to be any evidence of infection at  this time. Overall I think she is made great improvement and seems to be showing signs of coming back around where she was prior to the cellulitis and sepsis episode. 05/06/18 on evaluation today patient appears to be doing better in regard to her bilateral ocean the ulcers. She's been tolerating the dressing changes without complication. Fortunately there does not appear to be any evidence of infection at this time. Her swelling is significantly down overall seems to be showing signs of good improvement as well. Fortunately I do believe that she is progressing nicely back to where she was prior to the cellulitis/sepsis issue. 05/13/18 on evaluation today patient seems to be showing signs of great improvement she's been tolerating the dressing changes without complication and overall there does not appear to be any evidence of infection. All of which is good news. The only issue she really have today is that some of the [facility] Dressing actually  was stuck to the periwound of the right lateral lower extremity however this was able to be removed during the debridement without complication. 05/21/18 on evaluation today patient appears to be doing very well in regard to her bilateral lower extremity ulcers. She has been tolerating the compression wraps without complication. There does not appear to be any evidence of infection at this point which is good news as well. Overall I'm very pleased with the progress that has been made. She likewise is very happy. 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 05/28/18 on evaluation today patient actually appears to be doing excellent in regard to her bilateral lower [place] ulcers. She's made great progress since last week and overall I'm very pleased in this regard. She states that she's not have any discomfort either which is also good news there's  definitely no evidence of infection. 06/03/18 on evaluation today patient actually appears to be doing excellent in regard to her ulcerations in fact these areas on both lower extremities were almost completely healed. Unfortunately she has a situation going on her life right now she actually found her husband deceased last Oct 27, 2023. She states that she has been quite a bit upset since that time unfortunately this is obviously a very big blow to her. Nonetheless she is concerned about the funeral and having to wear certain shoes for this that she states she cannot fit in with the wraps. She wonders if she could potentially come back on October 27, 2023 to have her wraps changed and to switch into her Juxta-Lite compression at that point. I think that would be an okay thing that we could do for her. Nonetheless she fortunately seems to be tolerating the wraps very well and again has made excellent progress with the [facility] Dressing 06/11/18 on evaluation today patient appears to be doing rather well in regard to her bilateral lower extremities in fact everything appears to be completely healed at this point which is excellent news. There does not appear to be any evidence of infection at this time which is great. In fact overall I do believe she is completely resolved in regard to her bilateral lower extremity ulcers. She is extremely happy in this regard. 06/27/2023 Ms. Reeda Hollers is a 42 year old female with a past medical history of venous insufficiency and lymphedema that presents to the clinic for a 3-week history of nonhealing wound to the right lower extremity. She states this started spontaneously. She does not wear compression stockings. She has been keeping the area covered. She currently denies signs of infection. 8/14; patient readmitted to clinic last week. She has venous insufficiency and chronic secondary lymphedema. She has a nonhealing wound on the right lateral lower leg. She has been using  gent mupirocin and Hydrofera Blue using an Urgo K2 lite wrap. Her ABI on the right leg was 0.74 on admission to clinic. Formal arterial studies were ordered but we have not yet had any contact with the patient. Also there was some question about juxta lites for this patient we have not heard anything about that either. 8/28; patient presents for follow-up. She had ABIs completed on 8/23 that showed noncompressible ABIs on the right and left with TBI of 0.88 on the left and 0.96 on the right. She had triphasic waveforms throughout the arterial system. This is reassuring that she has adequate blood flow for healing. We have been using antibiotic  ointment with Hydrofera Blue under Urgo K2 lite wrap. She has tolerated this well. Wound is smaller. 9/4; patient presents for follow-up. We have been using Urgo 2 regular4-layer equivalent as well as Hydrofera Blue and antibiotic ointment. Wound is slightly smaller. She has no issues or complaints. She tolerated the wrap well. 9/11; patient presents for follow-up. We have been using Hydrofera Blue with antibiotic ointment under Urgo regular compression. She states the wrap slid down on Sunday and she has been keeping the area covered for the past 2 to 3 days. 9/18; small wound on the right lateral lower leg this is in the setting of significant lymphedema. We have you been using gent and mupirocin with Hydrofera Blue and Urgo K2 regular compression. The wound is about the same size this week. At 1 point she got wraparound stockings/external compression stockings during her time in our [place] [facility]. I am not sure she is actually been wearing these in fact I think not 9/25; right lateral leg in the setting of very significant stage III lymphedema. We have been using gent and mupirocin under Hydrofera Blue and using an Urgo K2 lite. She works in a daycare 10/2; right lateral leg in the setting of stage III lymphedema which is marginally controlled and  spite of 4-layer equivalent compression with [name] we have [name], [name] ([id number]) [phone].pdf Page 3 of 9 been using gent and mupirocin with Hydrofera Blue, not making much progress however in the small circular wound with a very fibrinous surface. 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 She tells [place] a pipe burst in her apartment she got the wrap wet while cleaning up took it off 10/9; right lateral lower leg in the setting of stage III lymphedema. Small punched-out area but with a continuously nonviable surface. I changed to her to Iodoflex last week to help with the surface although she still comes in with a lot of fibrinous debris 10/16; right lateral lower leg wound in the setting of stage III lymphedema significant hemosiderin deposition but no obvious erythema. She arrives in clinic today with a much better looking wound surface. I started her on Iodoflex 2 weeks ago and did an aggressive debridement last week. The dimensions are slightly slightly larger today probably because of the debridement, however I think the surface of the wound is quite a bit better today. 10/23; comes in today with a large amount of sanguinous drainage. This is different for this patient. She had no debridement last week. She says that there is some more pain than she is used to. She has not been systemically unwell. We have been using Iodoflex 10/30; patient had a wound infection last week which was new for her. Culture identified to have a growth of Citrobacter Koseri. It was pansensitive including to the doxycycline I gave her but still probably not the correct choice of antibiotic. She is still having drainage. The wound has improved but still does not look like what we had a few weeks ago. Electronic Signature(s) Signed: 09/19/2023 4:47:46 PM By: Baltazar Najjar MD Entered By: Baltazar Najjar on 09/19/2023  06:16:40 -------------------------------------------------------------------------------- Physical Exam Details Patient Name: Date of Service: [name], [name] 09/19/2023 8:30 A M Medical Record Number: [id number] Patient Account Number: [id number] Date of Birth/Sex: Treating RN: July 07, 1981 (42 y.o. [name] Primary Care Provider: PA Zenovia Jordan, NO Other Clinician: Referring Provider: Treating Provider/Extender: [name], [name], Referral Weeks in Treatment: 12 Constitutional Patient is  hypertensive.. Pulse regular and within target range for patient.Marland Kitchen Respirations regular, non-labored and within target range.. Temperature is normal and within the target range for the patient.Marland Kitchen appears in no distress. Notes Wound exam; right lateral lower leg. The tenderness around the wound is resolved. The wound surface does not look that healthy but I elected to forego a debridement until next week. A lot less swelling and erythema and tenderness than last week which is an improvement Electronic Signature(s) Signed: 09/19/2023 4:47:46 PM By: Baltazar Najjar MD Entered By: Baltazar Najjar on 09/19/2023 06:17:29 -------------------------------------------------------------------------------- Physician Orders Details Patient Name: Date of Service: [name], [name] 09/19/2023 8:30 A M Medical Record Number: [id number] Patient Account Number: [id number] Date of Birth/Sex: Treating RN: 07-21-1981 (42 y.o. [name] Primary Care Provider: PA Zenovia Jordan, NO Other Clinician: Referring Provider: Treating Provider/Extender: Samara Snide, Referral Jeanise, Memon Ronda [name] ([id number]) [id number] Page 4 of 9 Weeks in Treatment: 12 The following information was scribed by: Midge Aver The information was scribed for: Maxwell Caul Verbal / Phone Orders: No Diagnosis Coding Follow-up Appointments Return Appointment in 1 week. Nurse Visit as needed Bathing/  Shower/ Hygiene May shower with wound dressing protected with water repellent cover or cast protector. No tub bath. Anesthetic (Use 'Patient Medications' Section for Anesthetic Order Entry) Lidocaine applied to wound bed Wound Treatment Wound #7 - Lower Leg Wound Laterality: Right, Lateral Cleanser: Soap and Water 1 x Per Week/30 Days Discharge Instructions: Gently cleanse wound with antibacterial soap, rinse and pat dry prior to dressing wounds Cleanser: Vashe 5.8 (oz) 1 x Per Week/30 Days Discharge Instructions: Use vashe 5.8 (oz) as directed Prim Dressing: Silvercel 4 1/4x 4 1/4 (in/in) 1 x Per Week/30 Days ary Discharge Instructions: Apply Silvercel 4 1/4x 4 1/4 (in/in) as instructed Secondary Dressing: Zetuvit Plus 4x8 (in/in) 1 x Per Week/30 Days Compression Wrap: Urgo K2, two layer compression system, large 1 x Per Week/30 Days Patient Medications llergies: Bactrim, sulfa A Notifications Medication Indication Start End gm-ve wound infection 09/19/2023 Cipro DOSE oral 500 mg tablet - 1 tablet oral twice a day for 7 days (may d/c doxy) Electronic Signature(s) Signed: 09/19/2023 9:25:22 AM By: Baltazar Najjar MD Entered By: Baltazar Najjar on 09/19/2023 06:25:22 -------------------------------------------------------------------------------- Problem List Details Patient Name: Date of Service: [name], Jasline [name]. 09/19/2023 8:30 A M Medical Record Number: [id number] Patient Account Number: [id number] Date of Birth/Sex: Treating RN: [birth year] (42 y.o. [name] Primary Care Provider: PA Zenovia Jordan, NO Other Clinician: Referring Provider: Treating Provider/Extender: [name], [name], Referral Weeks in Treatment: 12 Active Problems ICD-10 Encounter Code Description Active Date MDM KATERI, FONTAN ([id number]) [id number] Page 5 of 9 Code Description Active Date MDM Diagnosis L97.812 Non-pressure chronic ulcer of other part of right lower  leg with fat layer 06/27/2023 No Yes exposed I87.311 Chronic venous hypertension (idiopathic) with ulcer of right lower extremity 06/27/2023 No Yes I89.0 Lymphedema, not elsewhere classified 06/27/2023 No Yes L03.115 Cellulitis of right lower limb 09/12/2023 No Yes Inactive Problems Resolved Problems Electronic Signature(s) Signed: 09/19/2023 4:47:46 PM By: Baltazar Najjar MD Entered By: Baltazar Najjar on 09/19/2023 06:15:07 -------------------------------------------------------------------------------- Progress Note Details Patient Name: Date of Service: [name], [name] 09/19/2023 8:30 A M Medical Record Number: [id number] Patient Account Number: [id number] Date of Birth/Sex: Treating RN: July 02, 1981 (42 y.o. [name] Primary Care Provider: PA Zenovia Jordan, NO Other Clinician: Referring Provider: Treating Provider/Extender: [name], [name], Referral Weeks in Treatment:  12 Subjective History of Present Illness (HPI) 42 year old patient well known to our [facility] wound care clinic where she has been seen since 2016 for bilateral lower extremity venous insufficiency disease with lymphedema and multiple ulcerations associated with morbid obesity. she had custom-made compression stockings and lymphedema pumps which were used in the past. most recently she was admitted to the hospital between October 11 and 09/02/2017 with sepsis, lower extremity wounds and lymphedema.she was initially treated in the outpatient with Keflex and Bactrim. she was initially treated in the hospital with vancomycin and Zosyn and changed over to Unasyn until her white count improved and her blood cultures were negative for 3 days. After her inpatient management she was discharged home on Augmentin to end on 09/13/2017 with a 14 day course. she has had outpatient vascular duplex scans completed in November 2017 and her right ABI was 1.1 and the left ABI is 1.3. she had normal toe brachial indices  bilaterally.she had three-vessel runoff in the right lower extremity and two-vessel runoff in the left lower extremity. On questioning the patient she does have custom made compression stockings and also has a lymphedema pump but has not been using it appropriately and has not been taking good care of herself. 09/17/2017 -- she returns today with compression stockings on the left side and the right side has had significant amount of drainage and has a very strong odor 09/24/2017 -- the drainage is increased significantly and she has more lymphedema and a very strong odor to her wound. Though she does not have systemic symptoms, or overt infection I believe she will benefit from some doxycycline given empirically. 10/01/2017 -- after starting the doxycycline and changing the dressing twice a week her symptoms and signs have definitely improved overall. 10/08/2017 -- she has completed her course of doxycycline but continues to have a lot of drainage and needs twice a week dressing changes. 11/08/17-she is here in follow-up evaluation for right lower extremity ulcers. She admits to using her lymphedema pumps twice daily, one hour per session. she is voicing no complaints or concerns, no signs of infection will change to [facility] 12/14/17 on evaluation today patient appears to be doing very well in regard to her wounds. She has been tolerating the dressing changes she continues to develop some portly the adherent granular tissue on the surface of the wound with some Slough. Obviously we are trying to get too much better wound bed. [name], [name] ([id number]) [id number] Page 6 of 9 With that being said the hyper granulation the Hydrofera Blue Dressing to have helped with which is excellent news. However I think it may be time to try something a little bit different at this point. 01/11/18 on evaluation today patient appears to be doing fairly well in regard to her right lateral  lower extremity ulcers. This shows excellent signs of filling in which is great news. There does not appear to be any evidence of infection which is also good news. She does continue to work as well is good school. She is having no pain. 01/22/18 on evaluation today patient appears to be doing a little bit worse in my opinion in regard to the overall quality of that granulation on her right lower extremity. She was not here last week due to being sick with a stomach virus this may have something to do with the fact that her wound appears to be a little bit worse. With that being said I'm also thinking that after switching from the  Hydrofera Blue Dressing to the silver collagen would really has not looked that's good in my opinion. We may want to swit 02/11/18 on evaluation today patient's right lower/lateral lower extremity ulcers appear to be doing very well at this point. Especially the more proximal ulcer has filled in much closer to surface which is good news. Nonetheless both show signs of improvement which is great news. There does not appear to be any evidence of infection which is also good news. In general patient has been doing well tolerating the wraps as well as the [employer]. 02/18/18 on evaluation today patient appears to be doing a little bit worse in regard to the periwound region the wounds themselves do not look much deteriorated to me. With that being said she has several small blisters/pustules noted in the periwound and there was a significant amount of drainage and maceration compared to previous. There has been a time that we had to bring her back for twice a week dressing changes as far as her wrap was concerned it has been a while since we've done that however. With that being said the patient has been having some burning and in general I'm concerned about the possibility of infection. She has previously taken doxycycline with good result. Fortunately there does not  appear to be any evidence of overall worsening in regard to the size of the wound and in fact the upper wound actually appears to be showing signs of good epithelialization. 03/18/18 on evaluation today patient appears to be doing excellent in regard to her right lateral lower extremity ulcer. She has been tolerating the dressing changes without complication. Fortunately this seems to be making great progress. Overall I see no signs of infection and there is dramatic improvement overall even compared to last week. 03/28/18 on evaluation today patient appears to be doing very well in regard to her right lateral lower surety ulcer. She has been tolerating the dressing changes without complication at this point. She states currently that she's having no significant discomfort which is excellent as well. Overall I'm pleased with how things seem to be progressing. 04/01/18 on evaluation today patient appears to be doing excellent in regard to her right lateral lower extremity ulcers. She has been tolerating the dressing changes without complication which is good news. With that being said the wraps still continues to show signs of helping with her fluid she does have Juxta-Lite compression stockings for when we are done with the current treatment regimen once everything heals. Mainly she just has the one area still remaining the smaller of the two wings is pretty much closed at this point there's just a very slight opening noted. 04/08/18 on evaluation today patient appears to be doing better in regard to the original wound on the right lateral lower extremity that we have been managing. Unfortunately she has a new area of weeping more anterior on the right lateral lower extremity and on the left lower extremity she has two new ulcers there appears to be some cellulitis noted at this time. I am concerned about the fact that this may in fact be an infection that has caused the worsening and swelling in the past  this has been the case when we previously attempted to determine what was going on when she had down slides like this. With that being said the patient is seeming to tolerate the wraps fairly well for the most part. 04/17/18; since last time the patient was seen in this clinic  she was hospitalized from 04/11/18 through 04/13/18; she presented with bilateral lower extremity pain worse on the right and a fever of up to 104. Noteworthy that when she was in the clinic last week she had new wounds on the left leg culture grew MRSA and she was prescribed Bactrim. She had 2 days of IV Vanco and Zosyn in the hospital. Her blood cultures were negative. She was discharged on Bactrim to cover the original MRSA on the right leg and Keflex to cover the possibility of strep. The hospitalist had a conversation with infectious disease. The patient arrives in clinic today for nurse check however given the recent hospitalization I was asked to look at her. The patient states she feels a lot better. No fever or chills. Still some pain in the right calf but a lot better. She arrived in the hospital with a white count of 15.6, the next day was 10.8. Comprehensive metabolic panel was normal. She is still taking Keflex and Bactrim It would appear that she had a surgical IandD at the bedside of the right calf felt to have a underlying abscess. According to our intake nurse the wound has expanded quite bit on the right lateral calf. She has no open area on the left anterior and left posterior calf as described last time 04/23/18 on evaluation today patient actually appears to be doing much better than when I last saw her. This obviously has been a couple weeks ago and in the interim she was admitted to the hospital for IV antibiotic therapy due to cellulitis, discharge, and fortunately the substance which hadn't sued has completely resolved. Her swelling seems to be better in regard to her lower extremities as well and the wounds  that opened up as results of the infection seems to be showing signs of improvement. There is some Slough noted on the left lateral wound otherwise a lot of weeping noted of the right lateral leg. 04/29/18 on evaluation today patient appears to be doing excellent in regard to her bilateral lower extremity ulcers. She's been tolerating the dressing changes without complication there does not appear to be any evidence of infection at this time. Overall I think she is made great improvement and seems to be showing signs of coming back around where she was prior to the cellulitis and sepsis episode. 05/06/18 on evaluation today patient appears to be doing better in regard to her bilateral ocean the ulcers. She's been tolerating the dressing changes without complication. Fortunately there does not appear to be any evidence of infection at this time. Her swelling is significantly down overall seems to be showing signs of good improvement as well. Fortunately I do believe that she is progressing nicely back to where she was prior to the cellulitis/sepsis issue. 05/13/18 on evaluation today patient seems to be showing signs of great improvement she's been tolerating the dressing changes without complication and overall there does not appear to be any evidence of infection. All of which is good news. The only issue she really have today is that some of the [facility] Dressing actually was stuck to the periwound of the right lateral lower extremity however this was able to be removed during the debridement without complication. 05/21/18 on evaluation today patient appears to be doing very well in regard to her bilateral lower extremity ulcers. She has been tolerating the compression wraps without complication. There does not appear to be any evidence of infection at this point which is good news as well.  Overall I'm very pleased with the progress that has been made. She likewise is very happy. 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 05/28/18 on evaluation today patient actually appears to be doing excellent in regard to her bilateral lower [place] ulcers. She's made great progress since last week and overall I'm very pleased in this regard. She states that she's not have any discomfort either which is also good news there's definitely no evidence of infection. 06/03/18 on evaluation today patient actually appears to be doing excellent in regard to her ulcerations in fact these areas on both lower extremities were almost completely healed. Unfortunately she has a situation going on her life right now she actually found her husband deceased last 10/20/23. She states that she has been quite a bit upset since that time unfortunately this is obviously a very big blow to her. Nonetheless she is concerned about the funeral and having to wear certain shoes for this that she states she cannot fit in with the wraps. She wonders if she could potentially come back on 20-Oct-2023 to have her wraps changed and to switch into her Juxta-Lite compression at that point. I think that would be an okay thing that we could do for her. Nonetheless she fortunately seems to be tolerating the wraps very well and again has made excellent progress with the [facility] Dressing 06/11/18 on evaluation today patient appears to be doing rather well in regard to her bilateral lower extremities in fact everything appears to be completely healed at this point which is excellent news. There does not appear to be any evidence of infection at this time which is great. In fact overall I do believe she is completely resolved in regard to her bilateral lower extremity ulcers. She is extremely happy in this regard. [name], [name] ([id number]) [id number] Page 7 of 9 06/27/2023 Ms. [name] is a 42 year old  female with a past medical history of venous insufficiency and lymphedema that presents to the clinic for a 3-week history of nonhealing wound to the right lower extremity. She states this started spontaneously. She does not wear compression stockings. She has been keeping the area covered. She currently denies signs of infection. 8/14; patient readmitted to clinic last week. She has venous insufficiency and chronic secondary lymphedema. She has a nonhealing wound on the right lateral lower leg. She has been using gent mupirocin and Hydrofera Blue using an Urgo K2 lite wrap. Her ABI on the right leg was 0.74 on admission to clinic. Formal arterial studies were ordered but we have not yet had any contact with the patient. Also there was some question about juxta lites for this patient we have not heard anything about that either. 8/28; patient presents for follow-up. She had ABIs completed on 8/23 that showed noncompressible ABIs on the right and left with TBI of 0.88 on the left and 0.96 on the right. She had triphasic waveforms throughout the arterial system. This is reassuring that she has adequate blood flow for healing. We have been using antibiotic ointment with Hydrofera Blue under Urgo K2 lite wrap. She has tolerated this well. Wound is smaller. 9/4; patient presents for follow-up. We have been using Urgo 2 regular4-layer equivalent as well as Hydrofera Blue and antibiotic ointment. Wound is slightly smaller. She has no issues or complaints. She tolerated the wrap well. 9/11; patient presents for follow-up. We have been  using Hydrofera Blue with antibiotic ointment under Urgo regular compression. She states the wrap slid down on Sunday and she has been keeping the area covered for the past 2 to 3 days. 9/18; small wound on the right lateral lower leg this is in the setting of significant lymphedema. We have you been using gent and mupirocin with Hydrofera Blue and Urgo K2 regular compression.  The wound is about the same size this week. At 1 point she got wraparound stockings/external compression stockings during her time in our [facility]. I am not sure she is actually been wearing these in fact I think not 9/25; right lateral leg in the setting of very significant stage III lymphedema. We have been using gent and mupirocin under Hydrofera Blue and using an Urgo K2 lite. 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 She tells [place] a pipe burst in her apartment she got the wrap wet while cleaning up took it off 10/9; right lateral lower leg in the setting of stage III lymphedema. Small punched-out area but with a continuously nonviable surface. I changed to her to Iodoflex last week to help with the surface although she still comes in with a lot of fibrinous debris 10/16; right lateral lower leg wound in the setting of stage III lymphedema significant hemosiderin deposition but no obvious erythema. She arrives in clinic today with a much better looking wound surface. I started her on Iodoflex 2 weeks ago and did an aggressive debridement last week. The dimensions are slightly slightly larger today probably because of the debridement, however I think the surface of the wound is quite a bit better today. 10/23; comes in today with a large amount of sanguinous drainage. This is different for this patient. She had no debridement last week. She says that there is some more pain than she is used to. She has not been systemically unwell. We have been using Iodoflex 10/30; patient had a wound infection last week which was new for her. Culture identified to have a growth  of Citrobacter Koseri. It was pansensitive including to the doxycycline I gave her but still probably not the correct choice of antibiotic. She is still having drainage. The wound has improved but still does not look like what we had a few weeks ago. Objective Constitutional Patient is hypertensive.. Pulse regular and within target range for patient.Marland Kitchen Respirations regular, non-labored and within target range.. Temperature is normal and within the target range for the patient.Marland Kitchen appears in no distress. Vitals Time Taken: 8:53 AM, Height: 73 in, Weight: 436 lbs, BMI: 57.5, Temperature: 98.2 F, Pulse: 81 bpm, Respiratory Rate: 18 breaths/min, Blood Pressure: 135/95 mmHg. General Notes: Wound exam; right lateral lower leg. The tenderness around the wound is resolved. The wound surface does not look that healthy but I elected to forego a debridement until next week. A lot less swelling and erythema and tenderness than last week which is an improvement Integumentary (Hair, Skin) Wound #7 status is Open. Original cause of wound was Gradually Appeared. The date acquired was: 06/06/2023. The wound has been in treatment 12 weeks. The wound is located on the Right,Lateral Lower Leg. The wound measures  3.7cm length x 3.5cm width x 0.7cm depth; 10.171cm^2 area and 7.12cm^3 volume. There is Fat Layer (Subcutaneous Tissue) exposed. There is a medium amount of serosanguineous drainage noted. There is small (1-33%) red, pink granulation within the wound bed. There is a medium (34-66%) amount of necrotic tissue within the wound bed including Adherent Slough. Assessment Active Problems ICD-10 Non-pressure chronic ulcer of other part of right lower leg with fat layer exposed Chronic venous hypertension (idiopathic) with ulcer of right lower extremity Lymphedema, not elsewhere classified Cellulitis of right lower limb [name], [name] ([id number]) [id number] Page 8 of 9 Procedures Wound  #7 Pre-procedure diagnosis of Wound #7 is a Venous Leg Ulcer located on the Right,Lateral Lower Leg . There was a Four Layer Compression Therapy Procedure by Midge Aver, RN. Post procedure Diagnosis Wound #7: Same as Pre-Procedure Plan Follow-up Appointments: Return Appointment in 1 week. Nurse Visit as needed Bathing/ Shower/ Hygiene: May shower with wound dressing protected with water repellent cover or cast protector. No tub bath. Anesthetic (Use 'Patient Medications' Section for Anesthetic Order Entry): Lidocaine applied to wound bed WOUND #7: - Lower Leg Wound Laterality: Right, Lateral Cleanser: Soap and Water 1 x Per Week/30 Days Discharge Instructions: Gently cleanse wound with antibacterial soap, rinse and pat dry prior to dressing wounds Cleanser: Vashe 5.8 (oz) 1 x Per Week/30 Days Discharge Instructions: Use vashe 5.8 (oz) as directed Prim Dressing: Silvercel 4 1/4x 4 1/4 (in/in) 1 x Per Week/30 Days ary Discharge Instructions: Apply Silvercel 4 1/4x 4 1/4 (in/in) as instructed Secondary Dressing: Zetuvit Plus 4x8 (in/in) 1 x Per Week/30 Days Com pression Wrap: Urgo K2, two layer compression system, large 1 x Per Week/30 Days 1. I am going to change her from doxycycline to a week worth of ciprofloxacin. That should be sufficient. I simply was not happy about the surface of the wound although the periwound infection is resolved 2. Still using silver alginate under compression I do not think she needs a nurse visit this week 3. Likely to require debridement next week 4. Infection is cause some deterioration in the wound she is likely going to require debridement next week Electronic Signature(s) Signed: 09/19/2023 4:47:46 PM By: Baltazar Najjar MD Entered By: Baltazar Najjar on 09/19/2023 06:19:14 -------------------------------------------------------------------------------- SuperBill Details Patient Name: Date of Service: [name], Evvie [name]. 09/19/2023 Medical Record  Number: [id number] Patient Account Number: [id number] Date of Birth/Sex: Treating RN: 06-08-1981 (42 y.o. [name] Primary Care Provider: PA TIENT, NO Other Clinician: Referring Provider: Treating Provider/Extender: [name], [name], Referral Weeks in Treatment: 12 Diagnosis Coding ICD-10 Codes Code Description [phone] Non-pressure chronic ulcer of other part of right lower leg with fat layer exposed I87.311 Chronic venous hypertension (idiopathic) with ulcer of right lower extremity [name], [name] ([id number]) [id number] Page 9 of 9 I89.0 Lymphedema, not elsewhere classified L03.115 Cellulitis of right lower limb Facility Procedures : CPT4 Code: [id number] Description: (Facility Use Only) [phone] - APPLY MULTLAY COMPRS LWR RT LEG Modifier: Quantity: 1 Physician Procedures : CPT4 Code Description Modifier [id number] - WC PHYS LEVEL 4 - EST PT ICD-10 Diagnosis Description L97.812 Non-pressure chronic ulcer of other part of right lower leg with fat layer exposed I87.311 Chronic venous hypertension (idiopathic) with ulcer  of right lower extremity I89.0 Lymphedema, not elsewhere classified L03.115 Cellulitis of right lower limb Quantity: 1 Electronic Signature(s) Signed: 09/19/2023 4:47:46 PM By: Baltazar Najjar MD Entered By: Baltazar Najjar on 09/19/2023 06:19:44

## 2023-09-20 NOTE — Progress Notes (Signed)
[name] ([id number]) [id number] Page 1 of 9 Visit Report for 09/19/2023 Arrival Information Details Patient Name: Date of Service: [name], [name] 09/19/2023 8:30 A M Medical Record Number: [id number] Patient Account Number: [id number] Date of Birth/Sex: Treating RN: 1981-05-05 (42 y.o. [name] Primary Care Graves Nipp: PA Zenovia Jordan, NO Other Clinician: Referring Quintasia Theroux: Treating Carnita Golob/Extender: [name], [name], Referral Weeks in Treatment: 12 Visit Information History Since Last Visit Added or deleted any medications: No Patient Arrived: Ambulatory Any new allergies or adverse reactions: No Arrival Time: 08:46 Has Dressing in Place as Prescribed: Yes Accompanied By: self Has Compression in Place as Prescribed: Yes Transfer Assistance: None Pain Present Now: No Patient Identification Verified: Yes Secondary Verification Process Completed: Yes Patient Requires Transmission-Based Precautions: No Patient Has Alerts: No Electronic Signature(s) Signed: 09/19/2023 5:05:03 PM By: Midge Aver MSN RN CNS WTA Entered By: Midge Aver on 09/19/2023 08:53:43 -------------------------------------------------------------------------------- Clinic Level of Care Assessment Details Patient Name: Date of Service: [name] 09/19/2023 8:30 A M Medical Record Number: [id number] Patient Account Number: [id number] Date of Birth/Sex: Treating RN: 10/24/1981 (42 y.o. [name] Primary Care Dax Murguia: PA Zenovia Jordan, NO Other Clinician: Referring Traxton Kolenda: Treating Haydon Kalmar/Extender: [name], [name], Referral Weeks in Treatment: 12 Clinic Level of Care Assessment Items TOOL 1 Quantity Score []  - 0 Use when EandM and Procedure is performed on INITIAL visit ASSESSMENTS - Nursing Assessment / Reassessment []  - 0 General Physical Exam (combine w/ comprehensive assessment (listed just below) when performed on new pt. evals) []  -  0 Comprehensive Assessment (HX, ROS, Risk Assessments, Wounds Hx, etc.) ASSESSMENTS - Wound and Skin Assessment / Reassessment []  - 0 Dermatologic / Skin Assessment (not related to wound area) ASSESSMENTS - Ostomy and/or Continence Assessment and Care [name] ([id number]) [id number] Page 2 of 9 []  - 0 Incontinence Assessment and Management []  - 0 Ostomy Care Assessment and Management (repouching, etc.) PROCESS - Coordination of Care []  - 0 Simple Patient / Family Education for ongoing care []  - 0 Complex (extensive) Patient / Family Education for ongoing care []  - 0 Staff obtains Chiropractor, Records, T Results / Process Orders est []  - 0 Staff telephones HHA, Nursing Homes / Clarify orders / etc []  - 0 Routine Transfer to another Facility (non-emergent condition) []  - 0 Routine [name] Admission (non-emergent condition) []  - 0 New Admissions / Manufacturing engineer / Ordering NPWT Apligraf, etc. , []  - 0 Emergency [name] Admission (emergent condition) PROCESS - Special Needs []  - 0 Pediatric / Minor Patient Management []  - 0 Isolation Patient Management []  - 0 Hearing / Language / Visual special needs []  - 0 Assessment of Community assistance (transportation, D/C planning, etc.) []  - 0 Additional assistance / Altered mentation []  - 0 Support Surface(s) Assessment (bed, cushion, seat, etc.) INTERVENTIONS - Miscellaneous []  - 0 External ear exam []  - 0 Patient Transfer (multiple staff / Nurse, adult / Similar devices) []  - 0 Simple Staple / Suture removal (25 or less) []  - 0 Complex Staple / Suture removal (26 or more) []  - 0 Hypo/Hyperglycemic Management (do not check if billed separately) []  - 0 Ankle / Brachial Index (ABI) - do not check if billed separately Has the patient been seen at the [name] within the last three years: Yes Total Score: 0 Level Of Care: ____ Electronic Signature(s) Signed: 09/19/2023 5:05:03 PM  By: Midge Aver MSN RN CNS WTA Entered By: Midge Aver on 09/19/2023 09:14:44 --------------------------------------------------------------------------------  Compression Therapy Details Patient Name: Date of ServiceALIANNA, [name] 09/19/2023 8:30 A M Medical Record Number: [id number] Patient Account Number: [id number] Date of Birth/Sex: Treating RN: 1980-12-10 (42 y.o. [name] Primary Care Berle Fitz: PA Zenovia Jordan, NO Other Clinician: Referring Sumayah Bearse: Treating Alivia Cimino/Extender: [name], [name], Referral Weeks in Treatment: 12 Compression Therapy Performed for Wound Assessment: Wound #7 Right,Lateral Lower Leg Performed By: [name], RN Compression Type: [place] Patricia ([id number]) [id number] Page 3 of 9 Post Procedure Diagnosis Same as Pre-procedure Electronic Signature(s) Signed: 09/19/2023 5:05:03 PM By: Midge Aver MSN RN CNS WTA Entered By: Midge Aver on 09/19/2023 09:14:16 -------------------------------------------------------------------------------- Encounter Discharge Information Details Patient Name: Date of Service: [name] 09/19/2023 8:30 A M Medical Record Number: [id number] Patient Account Number: [id number] Date of Birth/Sex: Treating RN: Nov 14, 1981 (42 y.o. [name] Primary Care Tattianna Schnarr: PA Zenovia Jordan, NO Other Clinician: Referring Oryon Gary: Treating Dimitri Shakespeare/Extender: [name], [name], Referral Weeks in Treatment: 12 Encounter Discharge Information Items Discharge Condition: Stable Ambulatory Status: Ambulatory Discharge Destination: Home Transportation: Private Auto Accompanied By: self Schedule Follow-up Appointment: Yes Clinical Summary of Care: Electronic Signature(s) Signed: 09/19/2023 5:05:03 PM By: Midge Aver MSN RN CNS WTA Entered By: Midge Aver on 09/19/2023  09:15:50 -------------------------------------------------------------------------------- Lower Extremity Assessment Details Patient Name: Date of Service: [name], [name] 09/19/2023 8:30 A M Medical Record Number: [id number] Patient Account Number: [id number] Date of Birth/Sex: Treating RN: May 10, 1981 (42 y.o. [name] Primary Care Shasha Buchbinder: PA Zenovia Jordan, NO Other Clinician: Referring Yuka Lallier: Treating Issachar Broady/Extender: [name], [name], Referral Weeks in Treatment: 12 Edema Assessment Assessed: [Left: No] [Right: No] Edema: [Left: Ye] [Right: s] Calf Left: RightJENICE, GENTHNER [name] ([id number]) [id number] Page 4 of 9 Point of Measurement: 38 cm From Medial Instep 57 cm Ankle Left: Right: Point of Measurement: 13 cm From Medial Instep 34 cm Vascular Assessment Pulses: Dorsalis Pedis Palpable: [Right:Yes] Extremity colors, hair growth, and conditions: Extremity Color: [Right:Hyperpigmented] Hair Growth on Extremity: [Right:No] Temperature of Extremity: [Right:Warm] Capillary Refill: [Right:< 3 seconds] Dependent Rubor: [Right:No] Blanched when Elevated: [Right:No No] Toe Nail Assessment Left: Right: Thick: Yes Discolored: Yes Deformed: Yes Improper Length and Hygiene: Yes Electronic Signature(s) Signed: 09/19/2023 5:05:03 PM By: Midge Aver MSN RN CNS WTA Entered By: Midge Aver on 09/19/2023 09:07:33 -------------------------------------------------------------------------------- Multi Wound Chart Details Patient Name: Date of Service: [name], [name] 09/19/2023 8:30 A M Medical Record Number: [id number] Patient Account Number: [id number] Date of Birth/Sex: Treating RN: 28-May-1981 (42 y.o. [name] Primary Care Inetha Maret: PA TIENT, NO Other Clinician: Referring Itay Mella: Treating Marion Seese/Extender: [name], [name], Referral Weeks in Treatment: 12 Vital Signs Height(in): 73 Pulse(bpm): 81 Weight(lbs):  436 Blood Pressure(mmHg): 135/95 Body Mass Index(BMI): 57.5 Temperature(F): 98.2 Respiratory Rate(breaths/min): 18 [7:Photos:] [N/A:N/A] Right, Lateral Lower Leg N/A N/A Wound Location: RONATA, VANSANT [name] ([id number]) [id number] Page 5 of 9 Gradually Appeared N/A N/A Wounding Event: Venous Leg Ulcer N/A N/A Primary Etiology: Lymphedema, Hypertension N/A N/A Comorbid History: 06/06/2023 N/A N/A Date Acquired: 12 N/A N/A Weeks of Treatment: Open N/A N/A Wound Status: No N/A N/A Wound Recurrence: 3.7x3.5x0.7 N/A N/A Measurements L x W x D (cm) 10.171 N/A N/A A (cm) : rea 7.12 N/A N/A Volume (cm) : -194.30% N/A N/A % Reduction in A rea: -586.60% N/A N/A % Reduction in Volume: Full Thickness Without Exposed N/A N/A Classification: Support Structures Medium N/A N/A Exudate Amount: Serosanguineous  N/A N/A Exudate Type: red, brown N/A N/A Exudate Color: Small (1-33%) N/A N/A Granulation Amount: Red, Pink N/A N/A Granulation Quality: Medium (34-66%) [name]: Fat Layer (Subcutaneous Tissue): Yes N/A N/A Exposed Structures: Fascia: No Tendon: No Muscle: No Joint: No Bone: No None N/A N/A Epithelialization: Treatment Notes Electronic Signature(s) Signed: 09/19/2023 5:05:03 PM By: Midge Aver MSN RN CNS WTA Entered By: Midge Aver on 09/19/2023 09:11:58 -------------------------------------------------------------------------------- Multi-Disciplinary Care Plan Details Patient Name: Date of Service: [name], [name] 09/19/2023 8:30 A M Medical Record Number: [id number] Patient Account Number: [id number] Date of Birth/Sex: Treating RN: February 13, 1981 (42 y.o. [name] Primary Care Layken Beg: PA Zenovia Jordan, NO Other Clinician: Referring Subrina Vecchiarelli: Treating Jashon Ishida/Extender: [name], [name], Referral Weeks in Treatment: 12 Active Inactive Venous Leg Ulcer Nursing Diagnoses: Actual venous Insuffiency (use after  diagnosis is confirmed) Knowledge deficit related to disease process and management Goals: Patient will maintain optimal edema control Date Initiated: 06/27/2023 Date Inactivated: 08/01/2023 Target Resolution Date: 07/28/2023 Goal Status: Met Patient/caregiver will verbalize understanding of disease process and disease management Date Initiated: 06/27/2023 Date Inactivated: 08/01/2023 Target Resolution Date: 07/28/2023 Goal Status: Met Verify adequate tissue perfusion prior to therapeutic compression application Date Initiated: 06/27/2023 Target Resolution Date: 09/27/2023 Goal Status: Active MATTHEW, DAGGETT ([id number]) [id number] Page 6 of 9 Interventions: Assess peripheral edema status every visit. Compression as ordered Provide education on venous insufficiency Treatment Activities: Non-invasive vascular studies : 06/27/2023 T ordered outside of clinic : 06/27/2023 est Therapeutic compression applied : 06/27/2023 Notes: Wound/Skin Impairment Nursing Diagnoses: Impaired tissue integrity Knowledge deficit related to ulceration/compromised skin integrity Goals: Patient/caregiver will verbalize understanding of skin care regimen Date Initiated: 06/27/2023 Date Inactivated: 08/01/2023 Target Resolution Date: 07/28/2023 Goal Status: Met Ulcer/skin breakdown will have a volume reduction of 30% by week 4 Date Initiated: 06/27/2023 Date Inactivated: 08/01/2023 Target Resolution Date: 07/28/2023 Goal Status: Met Ulcer/skin breakdown will have a volume reduction of 50% by week 8 Date Initiated: 06/27/2023 Date Inactivated: 08/22/2023 Target Resolution Date: 08/27/2023 Goal Status: Met Ulcer/skin breakdown will have a volume reduction of 80% by week 12 Date Initiated: 06/27/2023 Target Resolution Date: 09/27/2023 Goal Status: Active Interventions: Assess patient/caregiver ability to obtain necessary supplies Assess patient/caregiver ability to perform ulcer/skin care regimen upon  admission and as needed Assess ulceration(s) every visit Provide education on ulcer and skin care Treatment Activities: Skin care regimen initiated : 06/27/2023 Notes: Electronic Signature(s) Signed: 09/19/2023 5:05:03 PM By: Midge Aver MSN RN CNS WTA Entered By: Midge Aver on 09/19/2023 09:15:05 -------------------------------------------------------------------------------- Pain Assessment Details Patient Name: Date of Service: [name], [name] 09/19/2023 8:30 A M Medical Record Number: [id number] Patient Account Number: [id number] Date of Birth/Sex: Treating RN: 11/12/1981 (42 y.o. [name] Primary Care Johneric Mcfadden: PA Zenovia Jordan, NO Other Clinician: Referring Byrne Capek: Treating Evangelina Delancey/Extender: [name], [name], Referral Weeks in Treatment: 12 Active Problems Location of Pain Severity and Description of Pain Patient Has Paino No Site Locations Luna, Viann [name] ([id number]) [id number] Page 7 of 9 Pain Management and Medication Current Pain Management: Electronic Signature(s) Signed: 09/19/2023 5:05:03 PM By: Midge Aver MSN RN CNS WTA Entered By: Midge Aver on 09/19/2023 08:55:42 -------------------------------------------------------------------------------- Patient/Caregiver Education Details Patient Name: Date of Service: [name], [name] 10/30/2024andnbsp8:30 A M Medical Record Number: [id number] Patient Account Number: [id number] Date of Birth/Gender: Treating RN: 05/27/1981 (42 y.o. [name] Primary Care Physician: PA Zenovia Jordan, NO Other Clinician: Referring Physician: Treating Physician/Extender: [name], MICHA EL G  Self, Referral Weeks in Treatment: 12 Education Assessment Education Provided To: Patient Education Topics Provided Wound/Skin Impairment: Handouts: Caring for Your Ulcer Methods: Explain/Verbal Responses: State content correctly Electronic Signature(s) Signed: 09/19/2023 5:05:03 PM By: Midge Aver MSN  RN CNS WTA Entered By: Midge Aver on 09/19/2023 09:15:16 [name], [name] ([id number]) [id number] Page 8 of 9 -------------------------------------------------------------------------------- Wound Assessment Details Patient Name: Date of Service: [name], MATTHAI. 09/19/2023 8:30 A M Medical Record Number: [id number] Patient Account Number: [id number] Date of Birth/Sex: Treating RN: Mar 11, 1981 (42 y.o. [name] Primary Care Dawan Farney: PA TIENT, NO Other Clinician: Referring Amorita Vanrossum: Treating Shayona Hibbitts/Extender: [name], [name], Referral Weeks in Treatment: 12 Wound Status Wound Number: 7 Primary Etiology: Venous Leg Ulcer Wound Location: Right, Lateral Lower Leg Wound Status: Open Wounding Event: Gradually Appeared Comorbid History: Lymphedema, Hypertension Date Acquired: 06/06/2023 Weeks Of Treatment: 12 Clustered Wound: No Photos Wound Measurements Length: (cm) 3.7 Width: (cm) 3.5 Depth: (cm) 0.7 Area: (cm) 10.171 Volume: (cm) 7.12 % Reduction in Area: -194.3% % Reduction in Volume: -586.6% Epithelialization: None Wound Description Classification: Full Thickness Without Exposed Suppor Exudate Amount: Medium Exudate Type: Serosanguineous Exudate Color: red, brown t Structures Foul Odor After Cleansing: No Slough/Fibrino Yes Wound Bed Granulation Amount: Small (1-33%) Exposed Structure Granulation Quality: Red, Pink Fascia Exposed: No Necrotic Amount: Medium (34-66%) Fat Layer (Subcutaneous Tissue) Exposed: Yes Necrotic Quality: Adherent Slough Tendon Exposed: No Muscle Exposed: No Joint Exposed: No Bone Exposed: No Treatment Notes Wound #7 (Lower Leg) Wound Laterality: Right, Lateral Cleanser Soap and Water Discharge Instruction: Gently cleanse wound with antibacterial soap, rinse and pat dry prior to dressing wounds Vashe 5.8 (oz) Rump, Amayia [name] ([id number]) [id number] Page 9 of 9 Discharge  Instruction: Use vashe 5.8 (oz) as directed Peri-Wound Care Topical Primary Dressing Silvercel 4 1/4x 4 1/4 (in/in) Discharge Instruction: Apply Silvercel 4 1/4x 4 1/4 (in/in) as instructed Secondary Dressing Zetuvit Plus 4x8 (in/in) Secured With Compression Wrap Urgo K2, two layer compression system, large Compression Stockings Add-Ons Electronic Signature(s) Signed: 09/19/2023 5:05:03 PM By: Midge Aver MSN RN CNS WTA Entered By: Midge Aver on 09/19/2023 09:06:47 -------------------------------------------------------------------------------- Vitals Details Patient Name: Date of Service: LEA TH, Deloras [name]. 09/19/2023 8:30 A M Medical Record Number: [id number] Patient Account Number: [id number] Date of Birth/Sex: Treating RN: May 13, 1981 (42 y.o. [name] Primary Care Nakeeta Sebastiani: PA TIENT, NO Other Clinician: Referring Jocelyne Reinertsen: Treating Eulas Schweitzer/Extender: [name], [name], Referral Weeks in Treatment: 12 Vital Signs Time Taken: 08:53 Temperature (F): 98.2 Height (in): 73 Pulse (bpm): 81 Weight (lbs): 436 Respiratory Rate (breaths/min): 18 Body Mass Index (BMI): 57.5 Blood Pressure (mmHg): 135/95 Reference Range: 80 - 120 mg / dl Electronic Signature(s) Signed: 09/19/2023 5:05:03 PM By: Midge Aver MSN RN CNS WTA Entered By: Midge Aver on 09/19/2023 08:55:34

## 2023-09-26 ENCOUNTER — Encounter: Payer: Self-pay | Attending: Physician Assistant | Admitting: Physician Assistant

## 2023-09-26 DIAGNOSIS — I872 Venous insufficiency (chronic) (peripheral): Secondary | ICD-10-CM | POA: Diagnosis not present

## 2023-09-26 DIAGNOSIS — I890 Lymphedema, not elsewhere classified: Secondary | ICD-10-CM | POA: Insufficient documentation

## 2023-09-26 DIAGNOSIS — L03115 Cellulitis of right lower limb: Secondary | ICD-10-CM | POA: Insufficient documentation

## 2023-09-26 DIAGNOSIS — L97812 Non-pressure chronic ulcer of other part of right lower leg with fat layer exposed: Secondary | ICD-10-CM | POA: Insufficient documentation

## 2023-09-26 DIAGNOSIS — I10 Essential (primary) hypertension: Secondary | ICD-10-CM | POA: Diagnosis not present

## 2023-09-26 DIAGNOSIS — I87311 Chronic venous hypertension (idiopathic) with ulcer of right lower extremity: Secondary | ICD-10-CM | POA: Diagnosis not present

## 2023-09-26 NOTE — Progress Notes (Addendum)
RICCI, PAFF R ([id number]) [id number] Page 1 of 9 Visit Report for 09/26/2023 Arrival Information Details Patient Name: Date of Service: [name], [name] 09/26/2023 8:30 [name] M Medical Record Number: [id number] Patient Account Number: [id number] Date of Birth/Sex: Treating RN: 08-Oct-1981 (42 y.Jessica. [name] Primary Care [name]: PA [name], NO Other Clinician: Referring [name]: Treating [name]/Extender: [name] Self, Referral Weeks in Treatment: 13 Visit Information History Since Last Visit Added or deleted any medications: No Patient Arrived: Ambulatory Any new allergies or adverse reactions: No Arrival Time: 08:51 Has Dressing in Place as Prescribed: Yes Accompanied By: self Has Compression in Place as Prescribed: Yes Transfer Assistance: None Pain Present Now: No Patient Identification Verified: Yes Secondary Verification Process Completed: Yes Patient Requires Transmission-Based Precautions: No Patient Has Alerts: No Electronic Signature(s) Signed: 09/27/2023 4:24:45 PM By: Jessica Aver MSN RN CNS WTA Entered By: [name] on 09/26/2023 08:57:07 -------------------------------------------------------------------------------- Clinic Level of Care Assessment Details Patient Name: Date of Service: [name], [name] 09/26/2023 8:30 [name] M Medical Record Number: [id number] Patient Account Number: [id number] Date of Birth/Sex: Treating RN: 1981/03/16 (69 y.Jessica. [name] Primary Care Ziyon Soltau: PA [name], NO Other Clinician: Referring Mariadel Mruk: Treating Arnesha Schiraldi/Extender: [name] Self, Referral Weeks in Treatment: 13 Clinic Level of Care Assessment Items TOOL 1 Quantity Score []  - 0 Use when EandM and Procedure is performed on INITIAL visit ASSESSMENTS - Nursing Assessment / Reassessment []  - 0 General Physical Exam (combine w/ comprehensive assessment (listed just below) when performed on new pt. evals) []  - 0 Comprehensive Assessment  (HX, ROS, Risk Assessments, Wounds Hx, etc.) ASSESSMENTS - Wound and Skin Assessment / Reassessment []  - 0 Dermatologic / Skin Assessment (not related to wound area) ASSESSMENTS - Ostomy and/or Continence Assessment and Care [name], [name] [id number] Page 2 of 9 []  - 0 Incontinence Assessment and Management []  - 0 Ostomy Care Assessment and Management (repouching, etc.) PROCESS - Coordination of Care []  - 0 Simple Patient / Family Education for ongoing care []  - 0 Complex (extensive) Patient / Family Education for ongoing care []  - 0 Staff obtains Chiropractor, Records, T Results / Process Orders est []  - 0 Staff telephones HHA, Nursing Homes / Clarify orders / etc []  - 0 Routine Transfer to another Facility (non-emergent condition) []  - 0 Routine Hospital Admission (non-emergent condition) []  - 0 New Admissions / Manufacturing engineer / Ordering NPWT Apligraf, etc. , []  - 0 Emergency Hospital Admission (emergent condition) PROCESS - Special Needs []  - 0 Pediatric / Minor Patient Management []  - 0 Isolation Patient Management []  - 0 Hearing / Language / Visual special needs []  - 0 Assessment of Community assistance (transportation, D/C planning, etc.) []  - 0 Additional assistance / Altered mentation []  - 0 Support Surface(s) Assessment (bed, cushion, seat, etc.) INTERVENTIONS - Miscellaneous []  - 0 External ear exam []  - 0 Patient Transfer (multiple staff / Nurse, adult / Similar devices) []  - 0 Simple Staple / Suture removal (25 or less) []  - 0 Complex Staple / Suture removal (26 or more) []  - 0 Hypo/Hyperglycemic Management (do not check if billed separately) []  - 0 Ankle / Brachial Index (ABI) - do not check if billed separately Has the patient been seen at the hospital within the last three years: Yes Total Score: 0 Level Of Care: ____ Electronic Signature(s) Signed: 09/27/2023 4:24:45 PM By: Jessica Aver MSN RN CNS  WTA Entered By: [name] on 09/26/2023 09:40:10 -------------------------------------------------------------------------------- Compression Therapy Details Patient Name: Date of  Service: [name], [name] 09/26/2023 8:30 [name] M Medical Record Number: [id number] Patient Account Number: [id number] Date of Birth/Sex: Treating RN: 1981/02/01 (78 y.Jessica. [name] Primary Care Van Ehlert: PA [name], NO Other Clinician: Referring Devaughn Savant: Treating Devonn Giampietro/Extender: [name] Self, Referral Weeks in Treatment: 13 Compression Therapy Performed for Wound Assessment: Wound #7 Right,Lateral Lower Leg Performed By: Jessica Janus, RN Compression Type: Four Pleas Patricia ([id number]) [id number] Page 3 of 9 Post Procedure Diagnosis Same as Pre-procedure Electronic Signature(s) Signed: 09/27/2023 4:24:45 PM By: Jessica Aver MSN RN CNS WTA Entered By: [name] on 09/26/2023 09:38:30 -------------------------------------------------------------------------------- Encounter Discharge Information Details Patient Name: Date of Service: [name], [name] 09/26/2023 8:30 [name] M Medical Record Number: [id number] Patient Account Number: [id number] Date of Birth/Sex: Treating RN: 04-Feb-1981 (37 y.Jessica. [name] Primary Care Aashrith Eves: PA [name], NO Other Clinician: Referring Greenlee Ancheta: Treating Ardra Kuznicki/Extender: [name] Self, Referral Weeks in Treatment: 13 Encounter Discharge Information Items Post Procedure Vitals Discharge Condition: Stable Temperature (F): 98.3 Ambulatory Status: Ambulatory Pulse (bpm): 79 Discharge Destination: Home Respiratory Rate (breaths/min): 18 Transportation: Private Auto Blood Pressure (mmHg): 136/84 Accompanied By: self Schedule Follow-up Appointment: Yes Clinical Summary of Care: Electronic Signature(s) Signed: 09/27/2023 4:24:45 PM By: Jessica Aver MSN RN CNS WTA Entered By: [name] on 09/26/2023  09:41:42 -------------------------------------------------------------------------------- Lower Extremity Assessment Details Patient Name: Date of Service: [name], [name] 09/26/2023 8:30 [name] M Medical Record Number: [id number] Patient Account Number: [id number] Date of Birth/Sex: Treating RN: 1981/05/15 (54 y.Jessica. [name] Primary Care Luara Faye: PA [name], NO Other Clinician: Referring Latia Mataya: Treating Itzel Lowrimore/Extender: [name] Self, Referral Weeks in Treatment: 13 Edema Assessment Assessed: [Left: No] [Right: No] [Left: Edema] [Right: :] Calf Left: RightLILLIANAH, SWARTZENTRUBER R ([id number]) [id number] Page 4 of 9 Point of Measurement: 38 cm From Medial Instep 57 cm Ankle Left: Right: Point of Measurement: 13 cm From Medial Instep 34 cm Vascular Assessment Pulses: Dorsalis Pedis Palpable: [Right:Yes] Extremity colors, hair growth, and conditions: Extremity Color: [Right:Hyperpigmented] Hair Growth on Extremity: [Right:No] Temperature of Extremity: [Right:Warm] Capillary Refill: [Right:< 3 seconds] Dependent Rubor: [Right:No] Blanched when Elevated: [Right:No No] Toe Nail Assessment Left: Right: Thick: Yes Discolored: Yes Deformed: Yes Improper Length and Hygiene: Yes Electronic Signature(s) Signed: 09/27/2023 4:24:45 PM By: Jessica Aver MSN RN CNS WTA Entered By: [name] on 09/26/2023 09:13:46 -------------------------------------------------------------------------------- Multi Wound Chart Details Patient Name: Date of Service: [name], [name] 09/26/2023 8:30 [name] M Medical Record Number: [id number] Patient Account Number: [id number] Date of Birth/Sex: Treating RN: 09/06/1981 (76 y.Jessica. [name] Primary Care Maicol Bowland: PA [name], NO Other Clinician: Referring Ariq Khamis: Treating Ezmeralda Stefanick/Extender: [name] Self, Referral Weeks in Treatment: 13 Vital Signs Height(in): 73 Pulse(bpm): 79 Weight(lbs): 436 Blood Pressure(mmHg):  136/84 Body Mass Index(BMI): 57.5 Temperature(F): 98.3 Respiratory Rate(breaths/min): 18 [7:Photos:] [N/[name]:N/[name]] Right, Lateral Lower Leg N/[name] N/[name] Wound Location: HARNEET, NOBLETT R ([id number]) [id number] Page 5 of 9 Gradually Appeared N/[name] N/[name] Wounding Event: Venous Leg Ulcer N/[name] N/[name] Primary Etiology: Lymphedema, Hypertension N/[name] N/[name] Comorbid History: 06/06/2023 N/[name] N/[name] Date Acquired: 63 N/[name] N/[name] Weeks of Treatment: Open N/[name] N/[name] Wound Status: No N/[name] N/[name] Wound Recurrence: 3.3x3.7x0.4 N/[name] N/[name] Measurements L x W x D (cm) 9.59 N/[name] N/[name] [name] (cm) : rea 3.836 N/[name] N/[name] Volume (cm) : -177.50% N/[name] N/[name] % Reduction in [name] rea: -269.90% N/[name] N/[name] % Reduction in Volume: Full Thickness Without Exposed N/[name] N/[name] Classification: Support Structures Medium N/[name] N/[name] Exudate Amount: Serosanguineous N/[name] N/[name] Exudate Type: red, brown N/[name]  N/[name] Exudate Color: Small (1-33%) N/[name] N/[name] Granulation Amount: Red, Pink N/[name] N/[name] Granulation Quality: Medium (34-66%) N/[name] N/[name] Necrotic Amount: Fat Layer (Subcutaneous Tissue): Yes N/[name] N/[name] Exposed Structures: Fascia: No Tendon: No Muscle: No Joint: No Bone: No None N/[name] N/[name] Epithelialization: Treatment Notes Electronic Signature(s) Signed: 09/27/2023 4:24:45 PM By: Jessica Aver MSN RN CNS WTA Entered By: [name] on 09/26/2023 09:14:07 -------------------------------------------------------------------------------- Multi-Disciplinary Care Plan Details Patient Name: Date of Service: [name], [name] 09/26/2023 8:30 [name] M Medical Record Number: [id number] Patient Account Number: [id number] Date of Birth/Sex: Treating RN: 1981-08-26 (89 y.Jessica. [name] Primary Care Buena Boehm: PA [name], NO Other Clinician: Referring Armandina Iman: Treating Carroll Lingelbach/Extender: [name] Self, Referral Weeks in Treatment: 13 Active Inactive Venous Leg Ulcer Nursing Diagnoses: Actual venous Insuffiency (use after diagnosis is confirmed) Knowledge  deficit related to disease process and management Goals: Patient will maintain optimal edema control Date Initiated: 06/27/2023 Date Inactivated: 08/01/2023 Target Resolution Date: 07/28/2023 Goal Status: Met Patient/caregiver will verbalize understanding of disease process and disease management Date Initiated: 06/27/2023 Date Inactivated: 08/01/2023 Target Resolution Date: 07/28/2023 Goal Status: Met Verify adequate tissue perfusion prior to therapeutic compression application Date Initiated: 06/27/2023 Target Resolution Date: 10/27/2023 Goal Status: Active TATTIANNA, SCHNARR ([id number]) [id number] Page 6 of 9 Interventions: Assess peripheral edema status every visit. Compression as ordered Provide education on venous insufficiency Treatment Activities: Non-invasive vascular studies : 06/27/2023 T ordered outside of clinic : 06/27/2023 est Therapeutic compression applied : 06/27/2023 Notes: Wound/Skin Impairment Nursing Diagnoses: Impaired tissue integrity Knowledge deficit related to ulceration/compromised skin integrity Goals: Patient/caregiver will verbalize understanding of skin care regimen Date Initiated: 06/27/2023 Date Inactivated: 08/01/2023 Target Resolution Date: 07/28/2023 Goal Status: Met Ulcer/skin breakdown will have [name] volume reduction of 30% by week 4 Date Initiated: 06/27/2023 Date Inactivated: 08/01/2023 Target Resolution Date: 07/28/2023 Goal Status: Met Ulcer/skin breakdown will have [name] volume reduction of 50% by week 8 Date Initiated: 06/27/2023 Date Inactivated: 08/22/2023 Target Resolution Date: 08/27/2023 Goal Status: Met Ulcer/skin breakdown will have [name] volume reduction of 80% by week 12 Date Initiated: 06/27/2023 Target Resolution Date: 10/27/2023 Goal Status: Active Interventions: Assess patient/caregiver ability to obtain necessary supplies Assess patient/caregiver ability to perform ulcer/skin care regimen upon admission and as needed Assess  ulceration(s) every visit Provide education on ulcer and skin care Treatment Activities: Skin care regimen initiated : 06/27/2023 Notes: Electronic Signature(s) Signed: 09/27/2023 4:24:45 PM By: Jessica Aver MSN RN CNS WTA Entered By: [name] on 09/26/2023 09:40:38 -------------------------------------------------------------------------------- Pain Assessment Details Patient Name: Date of Service: [name], [name] 09/26/2023 8:30 [name] M Medical Record Number: [id number] Patient Account Number: [id number] Date of Birth/Sex: Treating RN: [birth year] (12 y.Jessica. [name] Primary Care Wallace Gappa: PA [name], NO Other Clinician: Referring Zacarias Krauter: Treating Miral Hoopes/Extender: [name] Self, Referral Weeks in Treatment: 13 Active Problems Location of Pain Severity and Description of Pain Patient Has Paino No Site Locations [place], [place] R ([id number]) [id number] Page 7 of 9 Pain Management and Medication Current Pain Management: Electronic Signature(s) Signed: 09/27/2023 4:24:45 PM By: Jessica Aver MSN RN CNS WTA Entered By: [name] on 09/26/2023 09:02:00 -------------------------------------------------------------------------------- Patient/Caregiver Education Details Patient Name: Date of Service: [name], [name] 11/6/2024andnbsp8:30 [name] M Medical Record Number: [id number] Patient Account Number: [id number] Date of Birth/Gender: Treating RN: 01/01/1981 (25 y.Jessica. [name] Primary Care Physician: PA [name], NO Other Clinician: Referring Physician: Treating Physician/Extender: [name] Self, Referral Weeks in Treatment: 13 Education Assessment Education Provided To: Patient Education Topics Provided Wound/Skin Impairment: Handouts: Caring  for Your Ulcer Methods: Explain/Verbal Responses: State content correctly Electronic Signature(s) Signed: 09/27/2023 4:24:45 PM By: Jessica Aver MSN RN CNS WTA Entered By: [name] on 09/26/2023  09:40:50 Boakye, Ameah R ([id number]) [id number] Page 8 of 9 -------------------------------------------------------------------------------- Wound Assessment Details Patient Name: Date of Service: [name], [name] 09/26/2023 8:30 [name] M Medical Record Number: [id number] Patient Account Number: [id number] Date of Birth/Sex: Treating RN: 04/01/1981 (25 y.Jessica. [name] Primary Care Fount Bahe: PA [name], NO Other Clinician: Referring Rosbel Buckner: Treating Altus Zaino/Extender: [name] Self, Referral Weeks in Treatment: 13 Wound Status Wound Number: 7 Primary Etiology: Venous Leg Ulcer Wound Location: Right, Lateral Lower Leg Wound Status: Open Wounding Event: Gradually Appeared Comorbid History: Lymphedema, Hypertension Date Acquired: 06/06/2023 Weeks Of Treatment: 13 Clustered Wound: No Photos Wound Measurements Length: (cm) 3.3 Width: (cm) 3.7 Depth: (cm) 0.4 Area: (cm) 9.59 Volume: (cm) 3.836 % Reduction in Area: -177.5% % Reduction in Volume: -269.9% Epithelialization: None Wound Description Classification: Full Thickness Without Exposed Suppor Exudate Amount: Medium Exudate Type: Serosanguineous Exudate Color: red, brown t Structures Foul Odor After Cleansing: No Slough/Fibrino Yes Wound Bed Granulation Amount: Small (1-33%) Exposed Structure Granulation Quality: Red, Pink Fascia Exposed: No Necrotic Amount: Medium (34-66%) Fat Layer (Subcutaneous Tissue) Exposed: Yes Necrotic Quality: Adherent Slough Tendon Exposed: No Muscle Exposed: No Joint Exposed: No Bone Exposed: No Treatment Notes Wound #7 (Lower Leg) Wound Laterality: Right, Lateral Cleanser Soap and Water Discharge Instruction: Gently cleanse wound with antibacterial soap, rinse and pat dry prior to dressing wounds Vashe 5.8 (oz) Posey, Candita R ([id number]) [id number] Page 9 of 9 Discharge Instruction: Use vashe 5.8 (oz) as directed Peri-Wound  Care Topical Primary Dressing Hydrofera Blue Ready Transfer Foam, 2.5x2.5 (in/in) Discharge Instruction: Apply Hydrofera Blue Ready to wound bed as directed Secondary Dressing Zetuvit Plus 4x8 (in/in) Secured With Compression Wrap Urgo K2, two layer compression system, large Compression Stockings Add-Ons Electronic Signature(s) Signed: 09/27/2023 4:24:45 PM By: Jessica Aver MSN RN CNS WTA Entered By: [name] on 09/26/2023 09:10:02 -------------------------------------------------------------------------------- Vitals Details Patient Name: Date of Service: [name], Simmone R. 09/26/2023 8:30 [name] M Medical Record Number: [id number] Patient Account Number: [id number] Date of Birth/Sex: Treating RN: [birth year] (24 y.Jessica. [name] Primary Care Corydon Schweiss: PA [name], NO Other Clinician: Referring Savino Whisenant: Treating Kelliann Pendergraph/Extender: [name] Self, Referral Weeks in Treatment: 13 Vital Signs Time Taken: 08:57 Temperature (F): 98.3 Height (in): 73 Pulse (bpm): 79 Weight (lbs): 436 Respiratory Rate (breaths/min): 18 Body Mass Index (BMI): 57.5 Blood Pressure (mmHg): 136/84 Reference Range: 80 - 120 mg / dl Electronic Signature(s) Signed: 09/27/2023 4:24:45 PM By: Jessica Aver MSN RN CNS WTA Entered By: [name] on 09/26/2023 09:01:50

## 2023-09-26 NOTE — Progress Notes (Signed)
NELVA, HAUK R ([id number]) [id number] Page 1 of 2 Visit Report for 09/26/2023 Chief Complaint Document Details Patient Name: Date of Service: [name], [name] 09/26/2023 8:30 A M Medical Record Number: [id number] Patient Account Number: [id number] Date of Birth/Sex: Treating RN: May 08, 1981 (42 y.o. [name] Primary Care Provider: PA Zenovia Jordan, [place] Other Clinician: Referring Provider: Treating Provider/Extender: Allen Derry Self, Referral Weeks in Treatment: 13 Information Obtained from: Patient Chief Complaint 06/27/2023; right lower extremity wound Electronic Signature(s) Signed: 09/26/2023 8:40:29 AM By: Allen Derry PA-C Entered By: Allen Derry on 09/26/2023 08:40:29 -------------------------------------------------------------------------------- Problem List Details Patient Name: Date of Service: LEA TH, Orianna R. 09/26/2023 8:30 A M Medical Record Number: [id number] Patient Account Number: [id number] Date of Birth/Sex: Treating RN: 1981-04-28 (42 y.o. 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

## 2023-10-03 ENCOUNTER — Encounter: Payer: Self-pay | Admitting: Physician Assistant

## 2023-10-03 DIAGNOSIS — I890 Lymphedema, not elsewhere classified: Secondary | ICD-10-CM | POA: Diagnosis not present

## 2023-10-03 DIAGNOSIS — I10 Essential (primary) hypertension: Secondary | ICD-10-CM | POA: Diagnosis not present

## 2023-10-03 DIAGNOSIS — L03115 Cellulitis of right lower limb: Secondary | ICD-10-CM | POA: Diagnosis not present

## 2023-10-03 DIAGNOSIS — I872 Venous insufficiency (chronic) (peripheral): Secondary | ICD-10-CM | POA: Diagnosis not present

## 2023-10-03 DIAGNOSIS — L97812 Non-pressure chronic ulcer of other part of right lower leg with fat layer exposed: Secondary | ICD-10-CM | POA: Diagnosis not present

## 2023-10-03 DIAGNOSIS — I87311 Chronic venous hypertension (idiopathic) with ulcer of right lower extremity: Secondary | ICD-10-CM | POA: Diagnosis not present

## 2023-10-03 NOTE — Progress Notes (Addendum)
JACKLIN, WAREING [name] ([id number]) [id number] Page 1 of 11 Visit Report for 10/03/2023 Chief Complaint Document Details Patient Name: Date of Service: [name], [name] 10/03/2023 9:30 A M Medical Record Number: [id number] Patient Account Number: [id number] Date of Birth/Sex: Treating RN: 09-17-1981 (43 y.o. [name] Primary Care Provider: PA Zenovia Jordan, [place] Other Clinician: Referring Provider: Treating Provider/Extender: Allen Derry Self, Referral Weeks in Treatment: 14 Information Obtained from: Patient Chief Complaint 06/27/2023; right lower extremity wound Electronic Signature(s) Signed: 10/03/2023 9:52:58 AM By: Allen Derry PA-C Entered By: Allen Derry on 10/03/2023 09:52:58 -------------------------------------------------------------------------------- Debridement Details Patient Name: Date of Service: [name], [name] 10/03/2023 9:30 A M Medical Record Number: [id number] Patient Account Number: [id number] Date of Birth/Sex: Treating RN: 13-Apr-1981 (42 y.o. [name] Primary Care Provider: PA Zenovia Jordan, [place] Other Clinician: Referring Provider: Treating Provider/Extender: Allen Derry Self, Referral Weeks in Treatment: 14 Debridement Performed for Assessment: Wound #7 Right,Lateral Lower Leg Performed By: Physician Allen Derry, PA-C Debridement Type: Debridement Severity of Tissue Pre Debridement: Fat layer exposed Level of Consciousness (Pre-procedure): Awake and Alert Pre-procedure Verification/Time Out Yes - 10:21 Taken: Start Time: 10:21 Pain Control: Lidocaine 4% T opical Solution Percent of Wound Bed Debrided: 100% T Area Debrided (cm): otal 6.59 Tissue and other material debrided: Viable, Non-Viable, Slough, Subcutaneous, Biofilm, Slough Level: Skin/Subcutaneous Tissue Debridement Description: Excisional Instrument: Curette Bleeding: Minimum Hemostasis Achieved: Pressure Procedural Pain: 0 Post Procedural Pain: 0 [name], [name]  ([id number]) [id number] Page 2 of 11 Response to Treatment: Procedure was tolerated well Level of Consciousness (Post- Awake and Alert procedure): Post Debridement Measurements of Total Wound Length: (cm) 3 Width: (cm) 2.8 Depth: (cm) 0.2 Volume: (cm) 1.319 Character of Wound/Ulcer Post Debridement: Stable Severity of Tissue Post Debridement: Fat layer exposed Post Procedure Diagnosis Same as Pre-procedure Electronic Signature(s) Signed: 10/04/2023 7:57:50 AM By: Allen Derry PA-C Signed: 10/04/2023 5:33:20 PM By: Midge Aver MSN RN CNS WTA Entered By: Midge Aver on 10/03/2023 10:22:05 -------------------------------------------------------------------------------- HPI Details Patient Name: Date of Service: [name], [name] 10/03/2023 9:30 A M Medical Record Number: [id number] Patient Account Number: [id number] Date of Birth/Sex: Treating RN: [birth year] (42 y.o. [name] Primary Care Provider: PA Zenovia Jordan, [place] Other Clinician: Referring Provider: Treating Provider/Extender: Allen Derry Self, Referral Weeks in Treatment: 14 History of Present Illness HPI Description: 42 year old patient well known to our [facility] wound care clinic where she has been seen since 2016 for bilateral lower extremity venous insufficiency disease with lymphedema and multiple ulcerations associated with morbid obesity. she had custom-made compression stockings and lymphedema pumps which were used in the past. most recently she was admitted to the hospital between October 11 and 09/02/2017 with sepsis, lower extremity wounds and lymphedema.she was initially treated in the outpatient with Keflex and Bactrim. she was initially treated in the hospital with vancomycin and Zosyn and changed over to Unasyn until her white count improved and her blood cultures were negative for 3 days. After her inpatient management she was discharged home on Augmentin to end on 09/13/2017 with a  14 day course. she has had outpatient vascular duplex scans completed in November 2017 and her right ABI was 1.1 and the left ABI is 1.3. she had normal toe brachial indices bilaterally.she had three-vessel runoff in the right lower extremity and two-vessel runoff in the left lower extremity. On questioning the patient she does have custom made compression stockings and also has a lymphedema pump but has not been using it appropriately and has not been taking good  care of herself. 09/17/2017 -- she returns today with compression stockings on the left side and the right side has had significant amount of drainage and has a very strong odor 09/24/2017 -- the drainage is increased significantly and she has more lymphedema and a very strong odor to her wound. Though she does not have systemic symptoms, or overt infection I believe she will benefit from some doxycycline given empirically. 10/01/2017 -- after starting the doxycycline and changing the dressing twice a week her symptoms and signs have definitely improved overall. 10/08/2017 -- she has completed her course of doxycycline but continues to have a lot of drainage and needs twice a week dressing changes. 11/08/17-she is here in follow-up evaluation for right lower extremity ulcers. She admits to using her lymphedema pumps twice daily, one hour per session. she is voicing no complaints or concerns, no signs of infection will change to [facility] 12/14/17 on evaluation today patient appears to be doing very well in regard to her wounds. She has been tolerating the dressing changes she continues to develop some portly the adherent granular tissue on the surface of the wound with some Slough. Obviously we are trying to get too much better wound bed. With that being said the hyper granulation the Hydrofera Blue Dressing to have helped with which is excellent news. However I think it may be time to try something a little bit different at this  point. 01/11/18 on evaluation today patient appears to be doing fairly well in regard to her right lateral lower extremity ulcers. This shows excellent signs of filling in which is great news. There does not appear to be any evidence of infection which is also good news. She does continue to work as well is good school. She is having no pain. [name], [name] ([id number]) [id number] Page 3 of 11 01/22/18 on evaluation today patient appears to be doing a little bit worse in my opinion in regard to the overall quality of that granulation on her right lower extremity. She was not here last week due to being sick with a stomach virus this may have something to do with the fact that her wound appears to be a little bit worse. With that being said I'm also thinking that after switching from the [facility] Dressing to the silver collagen would really has not looked that's good in my opinion. We may want to swit 02/11/18 on evaluation today patient's right lower/lateral lower extremity ulcers appear to be doing very well at this point. Especially the more proximal ulcer has filled in much closer to surface which is good news. Nonetheless both show signs of improvement which is great news. There does not appear to be any evidence of infection which is also good news. In general patient has been doing well tolerating the wraps as well as the [employer]. 02/18/18 on evaluation today patient appears to be doing a little bit worse in regard to the periwound region the wounds themselves do not look much deteriorated to me. With that being said she has several small blisters/pustules noted in the periwound and there was a significant amount of drainage and maceration compared to previous. There has been a time that we had to bring her back for twice a week dressing changes as far as her wrap was concerned it has been a while since we've done that however. With that being said the  patient has been having some burning and in general I'm concerned about the possibility of  infection. She has previously taken doxycycline with good result. Fortunately there does not appear to be any evidence of overall worsening in regard to the size of the wound and in fact the upper wound actually appears to be showing signs of good epithelialization. 03/18/18 on evaluation today patient appears to be doing excellent in regard to her right lateral lower extremity ulcer. She has been tolerating the dressing changes without complication. Fortunately this seems to be making great progress. Overall I see no signs of infection and there is dramatic improvement overall even compared to last week. 03/28/18 on evaluation today patient appears to be doing very well in regard to her right lateral lower surety ulcer. She has been tolerating the dressing changes without complication at this point. She states currently that she's having no significant discomfort which is excellent as well. Overall I'm pleased with how things seem to be progressing. 04/01/18 on evaluation today patient appears to be doing excellent in regard to her right lateral lower extremity ulcers. She has been tolerating the dressing changes without complication which is good news. With that being said the wraps still continues to show signs of helping with her fluid she does have Juxta-Lite compression stockings for when we are done with the current treatment regimen once everything heals. Mainly she just has the one area still remaining the smaller of the two wings is pretty much closed at this point there's just a very slight opening noted. 04/08/18 on evaluation today patient appears to be doing better in regard to the original wound on the right lateral lower extremity that we have been managing. Unfortunately she has a new area of weeping more anterior on the right lateral lower extremity and on the left lower extremity she has two new  ulcers there appears to be some cellulitis noted at this time. I am concerned about the fact that this may in fact be an infection that has caused the worsening and swelling in the past this has been the case when we previously attempted to determine what was going on when she had down slides like this. With that being said the patient is seeming to tolerate the wraps fairly well for the most part. 04/17/18; since last time the patient was seen in this clinic she was hospitalized from 04/11/18 through 04/13/18; she presented with bilateral lower extremity pain worse on the right and a fever of up to 104. Noteworthy that when she was in the clinic last week she had new wounds on the left leg culture grew MRSA and she was prescribed Bactrim. She had 2 days of IV Vanco and Zosyn in the hospital. Her blood cultures were negative. She was discharged on Bactrim to cover the original MRSA on the right leg and Keflex to cover the possibility of strep. The hospitalist had a conversation with infectious disease. The patient arrives in clinic today for nurse check however given the recent hospitalization I was asked to look at her. The patient states she feels a lot better. No fever or chills. Still some pain in the right calf but a lot better. She arrived in the hospital with a white count of 15.6, the next day was 10.8. Comprehensive metabolic panel was normal. She is still taking Keflex and Bactrim It would appear that she had a surgical IandD at the bedside of the right calf felt to have a underlying abscess. According to our intake nurse the wound has expanded quite bit on the right lateral calf. She has  no open area on the left anterior and left posterior calf as described last time 04/23/18 on evaluation today patient actually appears to be doing much better than when I last saw her. This obviously has been a couple weeks ago and in the interim she was admitted to the hospital for IV antibiotic therapy due to  cellulitis, discharge, and fortunately the substance which hadn't sued has completely resolved. Her swelling seems to be better in regard to her lower extremities as well and the wounds that opened up as results of the infection seems to be showing signs of improvement. There is some Slough noted on the left lateral wound otherwise a lot of weeping noted of the right lateral leg. 04/29/18 on evaluation today patient appears to be doing excellent in regard to her bilateral lower extremity ulcers. She's been tolerating the dressing changes without complication there does not appear to be any evidence of infection at this time. Overall I think she is made great improvement and seems to be showing signs of coming back around where she was prior to the cellulitis and sepsis episode. 05/06/18 on evaluation today patient appears to be doing better in regard to her bilateral ocean the ulcers. She's been tolerating the dressing changes without complication. Fortunately there does not appear to be any evidence of infection at this time. Her swelling is significantly down overall seems to be showing signs of good improvement as well. Fortunately I do believe that she is progressing nicely back to where she was prior to the cellulitis/sepsis issue. 05/13/18 on evaluation today patient seems to be showing signs of great improvement she's been tolerating the dressing changes without complication and overall there does not appear to be any evidence of infection. All of which is good news. The only issue she really have today is that some of the [facility] Dressing actually was stuck to the periwound of the right lateral lower extremity however this was able to be removed during the debridement without complication. 05/21/18 on evaluation today patient appears to be doing very well in regard to her bilateral lower extremity ulcers. She has been tolerating the compression wraps without complication. There does not  appear to be any evidence of infection at this point which is good news as well. Overall I'm very pleased with the progress that has been made. She likewise is very happy. 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 05/28/18 on evaluation today patient actually appears to be doing excellent in regard to her bilateral lower [place] ulcers. She's made great progress since last week and overall I'm very pleased in this regard. She states that she's not have any discomfort either which is also good news there's definitely no evidence of infection. 06/03/18 on evaluation today patient actually appears to be doing excellent in regard to her ulcerations in fact these areas on both lower extremities were almost completely healed. Unfortunately she has a situation going on her life right now she actually found her husband deceased last 10/29/2023. She states that she has been quite a bit upset since that time unfortunately this is obviously a very big blow to her. Nonetheless she is concerned about the funeral and having to wear certain shoes for this that she states she cannot fit in with the wraps. She wonders if she could potentially come back on Oct 29, 2023 to have her wraps changed and to switch into  her Juxta-Lite compression at that point. I think that would be an okay thing that we could do for her. Nonetheless she fortunately seems to be tolerating the wraps very well and again has made excellent progress with the [facility] Dressing 06/11/18 on evaluation today patient appears to be doing rather well in regard to her bilateral lower extremities in fact everything appears to be completely healed at this point which is excellent news. There does not appear to be any evidence of infection at this time which is great. In fact overall I do believe she is completely resolved in regard to her bilateral  lower extremity ulcers. She is extremely happy in this regard. 06/27/2023 Ms. Taliana Kremin is a 42 year old female with a past medical history of venous insufficiency and lymphedema that presents to the clinic for a 3-week history of nonhealing wound to the right lower extremity. She states this started spontaneously. She does not wear compression stockings. She has been keeping the area covered. She currently denies signs of infection. 8/14; patient readmitted to clinic last week. She has venous insufficiency and chronic secondary lymphedema. She has a nonhealing wound on the right lateral lower leg. She has been using gent mupirocin and Hydrofera Blue using an Urgo K2 lite wrap. [name], [name] ([id number]) [id number] Page 4 of 11 Her ABI on the right leg was 0.74 on admission to clinic. Formal arterial studies were ordered but we have not yet had any contact with the patient. Also there was some question about juxta lites for this patient we have not heard anything about that either. 8/28; patient presents for follow-up. She had ABIs completed on 8/23 that showed noncompressible ABIs on the right and left with TBI of 0.88 on the left and 0.96 on the right. She had triphasic waveforms throughout the arterial system. This is reassuring that she has adequate blood flow for healing. We have been using antibiotic ointment with Hydrofera Blue under Urgo K2 lite wrap. She has tolerated this well. Wound is smaller. 9/4; patient presents for follow-up. We have been using Urgo 2 regular4-layer equivalent as well as Hydrofera Blue and antibiotic ointment. Wound is slightly smaller. She has no issues or complaints. She tolerated the wrap well. 9/11; patient presents for follow-up. We have been using Hydrofera Blue with antibiotic ointment under Urgo regular compression. She states the wrap slid down on Sunday and she has been keeping the area covered for the past 2 to 3 days. 9/18;  small wound on the right lateral lower leg this is in the setting of significant lymphedema. We have you been using gent and mupirocin with Hydrofera Blue and Urgo K2 regular compression. The wound is about the same size this week. At 1 point she got wraparound stockings/external compression stockings during her time in our [place] [facility]. I am not sure she is actually been wearing these in fact I think not 9/25; right lateral leg in the setting of very significant stage III lymphedema. We have been using gent and mupirocin under Hydrofera Blue and using an Urgo K2 lite. 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 She tells [place] a pipe burst in her apartment  she got the wrap wet while cleaning up took it off 10/9; right lateral lower leg in the setting of stage III lymphedema. Small punched-out area but with a continuously nonviable surface. I changed to her to Iodoflex last week to help with the surface although she still comes in with a lot of fibrinous debris 10/16; right lateral lower leg wound in the setting of stage III lymphedema significant hemosiderin deposition but no obvious erythema. She arrives in clinic today with a much better looking wound surface. I started her on Iodoflex 2 weeks ago and did an aggressive debridement last week. The dimensions are slightly slightly larger today probably because of the debridement, however I think the surface of the wound is quite a bit better today. 10/23; comes in today with a large amount of sanguinous drainage. This is different for this patient. She had no debridement last week. She says that there is some more pain than  she is used to. She has not been systemically unwell. We have been using Iodoflex 10/30; patient had a wound infection last week which was new for her. Culture identified to have a growth of Citrobacter Koseri. It was pansensitive including to the doxycycline I gave her but still probably not the correct choice of antibiotic. She is still having drainage. The wound has improved but still does not look like what we had a few weeks ago. 09-26-2023 upon evaluation today patient appears to be doing well currently in regard to her wound. She has been tolerating the dressing changes without complication. Fortunately there does not appear to be any signs of infection and she is making excellent progress towards complete closure. With that being said there is some need for sharp debridement today and also believe that she probably needs to have a continuation of therapy with regard to her infection. I would actually put her on Levaquin for 2 weeks based on what I am seeing from a drainage standpoint. 10-03-2023 upon evaluation today patient appears to be doing well currently in regard to her wound. She has been tolerating the dressing changes without complication. Fortunately there does not appear to be any signs of infection at this time. Electronic Signature(s) Signed: 10/03/2023 10:30:29 AM By: Allen Derry PA-C Entered By: Allen Derry on 10/03/2023 10:30:29 -------------------------------------------------------------------------------- Physical Exam Details Patient Name: Date of Service: [name], Simranjit [name]. 10/03/2023 9:30 A M Medical Record Number: [id number] Patient Account Number: [id number] Date of Birth/Sex: Treating RN: 02-23-1981 (42 y.o. [name] Primary Care Provider: PA Zenovia Jordan, [place] Other Clinician: Referring Provider: Treating Provider/Extender: Allen Derry Self, Referral Weeks in Treatment: 14 Constitutional Obese and well-hydrated in no acute distress. Respiratory normal breathing  without difficulty. Psychiatric SAMYA, ANGELL [name] ([id number]) [id number] Page 5 of 11 this patient is able to make decisions and demonstrates good insight into disease process. Alert and Oriented x 3. pleasant and cooperative. Notes Upon inspection patient's wound bed actually showed signs of improvement this is actually measuring smaller and looking much better. Fortunately I do not see any evidence of active infection or worsening overall and I believe that the patient is making good headway here towards closure. Electronic Signature(s) Signed: 10/03/2023 10:31:47 AM By: Allen Derry PA-C Entered By: Allen Derry on 10/03/2023 10:31:47 -------------------------------------------------------------------------------- Physician Orders Details Patient Name: Date of Service: [name], Emylee [name]. 10/03/2023 9:30 A M Medical Record Number: [id number] Patient Account Number: [id number] Date of Birth/Sex: Treating RN: Apr 09, 1981 (42 y.o. [name] Primary Care Provider: PA Fidela Juneau Other Clinician:  Referring Provider: Treating Provider/Extender: Allen Derry Self, Referral Weeks in Treatment: 38 Verbal / Phone Orders: No Diagnosis Coding ICD-10 Coding Code Description [phone] Non-pressure chronic ulcer of other part of right lower leg with fat layer exposed I87.311 Chronic venous hypertension (idiopathic) with ulcer of right lower extremity I89.0 Lymphedema, not elsewhere classified L03.115 Cellulitis of right lower limb Follow-up Appointments Return Appointment in 1 week. Nurse Visit as needed Bathing/ Shower/ Hygiene May shower with wound dressing protected with water repellent cover or cast protector. No tub bath. Anesthetic (Use 'Patient Medications' Section for Anesthetic Order Entry) Lidocaine applied to wound bed Wound Treatment Wound #7 - Lower Leg Wound Laterality: Right, Lateral Cleanser: Soap and Water 1 x Per Week/30 Days Discharge Instructions:  Gently cleanse wound with antibacterial soap, rinse and pat dry prior to dressing wounds Cleanser: Vashe 5.8 (oz) 1 x Per Week/30 Days Discharge Instructions: Use vashe 5.8 (oz) as directed Prim Dressing: Hydrofera Blue Ready Transfer Foam, 2.5x2.5 (in/in) 1 x Per Week/30 Days ary Discharge Instructions: Apply Hydrofera Blue Ready to wound bed as directed Secondary Dressing: Zetuvit Plus 4x8 (in/in) 1 x Per Week/30 Days Compression Wrap: Urgo K2, two layer compression system, large 1 x Per Week/30 Days Electronic Signature(s) Signed: 10/04/2023 7:57:50 AM By: Allen Derry PA-C Signed: 10/04/2023 5:33:20 PM By: Midge Aver MSN RN CNS [name], [name] ([id number]) [id number] Page 6 of 11 Entered By: Midge Aver on 10/03/2023 10:24:22 -------------------------------------------------------------------------------- Problem List Details Patient Name: Date of Service: [name], BOO. 10/03/2023 9:30 A M Medical Record Number: [id number] Patient Account Number: [id number] Date of Birth/Sex: Treating RN: 02-13-1981 (42 y.o. [name] Primary Care Provider: PA TIENT, NO Other Clinician: Referring Provider: Treating Provider/Extender: Allen Derry Self, Referral Weeks in Treatment: 14 Active Problems ICD-10 Encounter Code Description Active Date MDM Diagnosis L97.812 Non-pressure chronic ulcer of other part of right lower leg with fat layer 06/27/2023 No Yes exposed I87.311 Chronic venous hypertension (idiopathic) with ulcer of right lower extremity 06/27/2023 No Yes I89.0 Lymphedema, not elsewhere classified 06/27/2023 No Yes L03.115 Cellulitis of right lower limb 09/12/2023 No Yes Inactive Problems Resolved Problems Electronic Signature(s) Signed: 10/03/2023 9:52:55 AM By: Allen Derry PA-C Entered By: Allen Derry on 10/03/2023 09:52:55 -------------------------------------------------------------------------------- Progress Note Details Patient Name:  Date of Service: [name], Ajah [name]. 10/03/2023 9:30 A M Medical Record Number: [id number] Patient Account Number: [id number] Date of Birth/Sex: Treating RN: June 16, 1981 (42 y.o. [name], [name], [name] ([id number]) [id number] Page 7 of 11 Primary Care Provider: PA TIENT, NO Other Clinician: Referring Provider: Treating Provider/Extender: Allen Derry Self, Referral Weeks in Treatment: 14 Subjective Chief Complaint Information obtained from Patient 06/27/2023; right lower extremity wound History of Present Illness (HPI) 42 year old patient well known to our [facility] wound care clinic where she has been seen since 2016 for bilateral lower extremity venous insufficiency disease with lymphedema and multiple ulcerations associated with morbid obesity. she had custom-made compression stockings and lymphedema pumps which were used in the past. most recently she was admitted to the hospital between October 11 and 09/02/2017 with sepsis, lower extremity wounds and lymphedema.she was initially treated in the outpatient with Keflex and Bactrim. she was initially treated in the hospital with vancomycin and Zosyn and changed over to Unasyn until her white count improved and her blood cultures were negative for 3 days. After her inpatient management she was discharged home on Augmentin to end on 09/13/2017 with a 14 day course. she has had outpatient vascular duplex scans completed in  November 2017 and her right ABI was 1.1 and the left ABI is 1.3. she had normal toe brachial indices bilaterally.she had three-vessel runoff in the right lower extremity and two-vessel runoff in the left lower extremity. On questioning the patient she does have custom made compression stockings and also has a lymphedema pump but has not been using it appropriately and has not been taking good care of herself. 09/17/2017 -- she returns today with compression stockings on the left side and the  right side has had significant amount of drainage and has a very strong odor 09/24/2017 -- the drainage is increased significantly and she has more lymphedema and a very strong odor to her wound. Though she does not have systemic symptoms, or overt infection I believe she will benefit from some doxycycline given empirically. 10/01/2017 -- after starting the doxycycline and changing the dressing twice a week her symptoms and signs have definitely improved overall. 10/08/2017 -- she has completed her course of doxycycline but continues to have a lot of drainage and needs twice a week dressing changes. 11/08/17-she is here in follow-up evaluation for right lower extremity ulcers. She admits to using her lymphedema pumps twice daily, one hour per session. she is voicing no complaints or concerns, no signs of infection will change to [facility] 12/14/17 on evaluation today patient appears to be doing very well in regard to her wounds. She has been tolerating the dressing changes she continues to develop some portly the adherent granular tissue on the surface of the wound with some Slough. Obviously we are trying to get too much better wound bed. With that being said the hyper granulation the Hydrofera Blue Dressing to have helped with which is excellent news. However I think it may be time to try something a little bit different at this point. 01/11/18 on evaluation today patient appears to be doing fairly well in regard to her right lateral lower extremity ulcers. This shows excellent signs of filling in which is great news. There does not appear to be any evidence of infection which is also good news. She does continue to work as well is good school. She is having no pain. 01/22/18 on evaluation today patient appears to be doing a little bit worse in my opinion in regard to the overall quality of that granulation on her right lower extremity. She was not here last week due to being sick with a stomach virus  this may have something to do with the fact that her wound appears to be a little bit worse. With that being said I'm also thinking that after switching from the [name] Dressing to the silver collagen would really has not looked that's good in my opinion. We may want to swit 02/11/18 on evaluation today patient's right lower/lateral lower extremity ulcers appear to be doing very well at this point. Especially the more proximal ulcer has filled in much closer to surface which is good news. Nonetheless both show signs of improvement which is great news. There does not appear to be any evidence of infection which is also good news. In general patient has been doing well tolerating the wraps as well as the [employer]. 02/18/18 on evaluation today patient appears to be doing a little bit worse in regard to the periwound region the wounds themselves do not look much deteriorated to me. With that being said she has several small blisters/pustules noted in the periwound and there was a significant amount of drainage and maceration  compared to previous. There has been a time that we had to bring her back for twice a week dressing changes as far as her wrap was concerned it has been a while since we've done that however. With that being said the patient has been having some burning and in general I'm concerned about the possibility of infection. She has previously taken doxycycline with good result. Fortunately there does not appear to be any evidence of overall worsening in regard to the size of the wound and in fact the upper wound actually appears to be showing signs of good epithelialization. 03/18/18 on evaluation today patient appears to be doing excellent in regard to her right lateral lower extremity ulcer. She has been tolerating the dressing changes without complication. Fortunately this seems to be making great progress. Overall I see no signs of infection and there is dramatic  improvement overall even compared to last week. 03/28/18 on evaluation today patient appears to be doing very well in regard to her right lateral lower surety ulcer. She has been tolerating the dressing changes without complication at this point. She states currently that she's having no significant discomfort which is excellent as well. Overall I'm pleased with how things seem to be progressing. 04/01/18 on evaluation today patient appears to be doing excellent in regard to her right lateral lower extremity ulcers. She has been tolerating the dressing changes without complication which is good news. With that being said the wraps still continues to show signs of helping with her fluid she does have Juxta-Lite compression stockings for when we are done with the current treatment regimen once everything heals. Mainly she just has the one area still remaining the smaller of the two wings is pretty much closed at this point there's just a very slight opening noted. 04/08/18 on evaluation today patient appears to be doing better in regard to the original wound on the right lateral lower extremity that we have been managing. Unfortunately she has a new area of weeping more anterior on the right lateral lower extremity and on the left lower extremity she has two new ulcers there appears to be some cellulitis noted at this time. I am concerned about the fact that this may in fact be an infection that has caused the worsening and swelling in the past this has been the case when we previously attempted to determine what was going on when she had down slides like this. With that being said the patient is seeming to tolerate the wraps fairly well for the most part. 04/17/18; since last time the patient was seen in this clinic she was hospitalized from 04/11/18 through 04/13/18; she presented with bilateral lower extremity pain worse on the right and a fever of up to 104. Noteworthy that when she was in the clinic last  week she had new wounds on the left leg culture grew MRSA and she was prescribed Bactrim. She had 2 days of IV Vanco and Zosyn in the hospital. Her blood cultures were negative. She was discharged on Bactrim to cover the original MRSA on the right leg and Keflex to cover the possibility of strep. The hospitalist had a conversation with infectious disease. The patient arrives in clinic today for nurse check however given the recent hospitalization I was asked to look at her. The patient states she feels a lot better. No fever or chills. Still some pain in the right calf but a lot better. She arrived in the hospital with a white count  of 15.6, the next day was 10.8. Comprehensive metabolic panel was normal. She is still taking Keflex and Bactrim Youtsey, Juliet [name] ([id number]) [id number] Page 8 of 11 It would appear that she had a surgical IandD at the bedside of the right calf felt to have a underlying abscess. According to our intake nurse the wound has expanded quite bit on the right lateral calf. She has no open area on the left anterior and left posterior calf as described last time 04/23/18 on evaluation today patient actually appears to be doing much better than when I last saw her. This obviously has been a couple weeks ago and in the interim she was admitted to the hospital for IV antibiotic therapy due to cellulitis, discharge, and fortunately the substance which hadn't sued has completely resolved. Her swelling seems to be better in regard to her lower extremities as well and the wounds that opened up as results of the infection seems to be showing signs of improvement. There is some Slough noted on the left lateral wound otherwise a lot of weeping noted of the right lateral leg. 04/29/18 on evaluation today patient appears to be doing excellent in regard to her bilateral lower extremity ulcers. She's been tolerating the dressing changes without complication there does not  appear to be any evidence of infection at this time. Overall I think she is made great improvement and seems to be showing signs of coming back around where she was prior to the cellulitis and sepsis episode. 05/06/18 on evaluation today patient appears to be doing better in regard to her bilateral ocean the ulcers. She's been tolerating the dressing changes without complication. Fortunately there does not appear to be any evidence of infection at this time. Her swelling is significantly down overall seems to be showing signs of good improvement as well. Fortunately I do believe that she is progressing nicely back to where she was prior to the cellulitis/sepsis issue. 05/13/18 on evaluation today patient seems to be showing signs of great improvement she's been tolerating the dressing changes without complication and overall there does not appear to be any evidence of infection. All of which is good news. The only issue she really have today is that some of the [facility] Dressing actually was stuck to the periwound of the right lateral lower extremity however this was able to be removed during the debridement without complication. 05/21/18 on evaluation today patient appears to be doing very well in regard to her bilateral lower extremity ulcers. She has been tolerating the compression wraps without complication. There does not appear to be any evidence of infection at this point which is good news as well. Overall I'm very pleased with the progress that has been made. She likewise is very happy. 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 05/28/18 on evaluation today patient actually appears to be doing excellent in regard to her bilateral lower [place] ulcers. She's made great progress since last week and overall I'm very pleased in this regard. She states that she's not have any  discomfort either which is also good news there's definitely no evidence of infection. 06/03/18 on evaluation today patient actually appears to be doing excellent in regard to her ulcerations in fact these areas on both lower extremities were almost completely healed. Unfortunately she has a situation going on her life right now she actually found her husband deceased last  Friday. She states that she has been quite a bit upset since that time unfortunately this is obviously a very big blow to her. Nonetheless she is concerned about the funeral and having to wear certain shoes for this that she states she cannot fit in with the wraps. She wonders if she could potentially come back on Friday to have her wraps changed and to switch into her Juxta-Lite compression at that point. I think that would be an okay thing that we could do for her. Nonetheless she fortunately seems to be tolerating the wraps very well and again has made excellent progress with the [facility] Dressing 06/11/18 on evaluation today patient appears to be doing rather well in regard to her bilateral lower extremities in fact everything appears to be completely healed at this point which is excellent news. There does not appear to be any evidence of infection at this time which is great. In fact overall I do believe she is completely resolved in regard to her bilateral lower extremity ulcers. She is extremely happy in this regard. 06/27/2023 Ms. [name] Dross is a 42 year old female with a past medical history of venous insufficiency and lymphedema that presents to the clinic for a 3-week history of nonhealing wound to the right lower extremity. She states this started spontaneously. She does not wear compression stockings. She has been keeping the area covered. She currently denies signs of infection. 8/14; patient readmitted to clinic last week. She has venous insufficiency and chronic secondary lymphedema. She has a nonhealing wound on  the right lateral lower leg. She has been using gent mupirocin and Hydrofera Blue using an Urgo K2 lite wrap. Her ABI on the right leg was 0.74 on admission to clinic. Formal arterial studies were ordered but we have not yet had any contact with the patient. Also there was some question about juxta lites for this patient we have not heard anything about that either. 8/28; patient presents for follow-up. She had ABIs completed on 8/23 that showed noncompressible ABIs on the right and left with TBI of 0.88 on the left and 0.96 on the right. She had triphasic waveforms throughout the arterial system. This is reassuring that she has adequate blood flow for healing. We have been using antibiotic ointment with Hydrofera Blue under Urgo K2 lite wrap. She has tolerated this well. Wound is smaller. 9/4; patient presents for follow-up. We have been using Urgo 2 regular4-layer equivalent as well as Hydrofera Blue and antibiotic ointment. Wound is slightly smaller. She has no issues or complaints. She tolerated the wrap well. 9/11; patient presents for follow-up. We have been using Hydrofera Blue with antibiotic ointment under Urgo regular compression. She states the wrap slid down on Sunday and she has been keeping the area covered for the past 2 to 3 days. 9/18; small wound on the right lateral lower leg this is in the setting of significant lymphedema. We have you been using gent and mupirocin with Hydrofera Blue and Urgo K2 regular compression. The wound is about the same size this week. At 1 point she got wraparound stockings/external compression stockings during her time in our [place] sister clinic. I am not sure she is actually been wearing these in fact I think not 9/25; right lateral leg in the setting of very significant stage III lymphedema. We have been using gent and mupirocin under Hydrofera Blue and using an Urgo K2 lite. She works in a daycare 10/2; right lateral leg in the setting of stage  III lymphedema which is marginally controlled and spite of 4-layer equivalent compression with Urgo K2 we have been using gent and mupirocin with Hydrofera Blue, not making much progress however in the small circular wound with a very fibrinous surface. 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 She tells [place] a pipe burst in her apartment she got the wrap wet while cleaning up took it off 10/9; right lateral lower leg in the setting of stage III lymphedema. Small punched-out area but with a continuously nonviable surface. I changed to her to Iodoflex last week to help with the surface although she still comes in with a lot of fibrinous debris 10/16; right lateral lower leg wound in the setting of stage III lymphedema significant hemosiderin deposition but no obvious erythema. She arrives in clinic today with a much better looking wound surface. I started her on Iodoflex 2 weeks ago and did an aggressive debridement last week. The dimensions are slightly slightly larger today probably because of the debridement, however I think the surface of the wound is quite a bit better today. 10/23; comes in today with a large amount of sanguinous drainage. This is different for this patient. She had no debridement last week. She says that there is some more pain than she is used to. She has not been systemically unwell. We have been using Iodoflex 10/30; patient had a wound infection last week which was new for her. Culture identified to have a growth of Citrobacter Koseri. It was pansensitive including to the doxycycline I gave her but still probably not the correct choice of antibiotic. She is still having drainage. The wound has improved but still does not look like what we had a few weeks ago. 09-26-2023 upon evaluation today patient appears to be doing well currently in regard to her wound. She has been tolerating the dressing changes without complication. Fortunately there does not appear to be any signs of  infection and she is making excellent progress towards complete closure. With that being said there is some need for sharp debridement today and also believe that she probably needs to have a continuation of therapy with regard to her infection. [name], [name] ([id number]) [id number] Page 9 of 11 would actually put her on Levaquin for 2 weeks based on what I am seeing from a drainage standpoint. 10-03-2023 upon evaluation today patient appears to be doing well currently in regard to her wound. She has been tolerating the dressing changes without complication. Fortunately there does not appear to be any signs of infection at this time. Objective Constitutional Obese and well-hydrated in no acute distress. Vitals Time Taken: 9:47 AM, Height: 73 in, Weight: 436 lbs, BMI: 57.5, Temperature: 97.8 F, Pulse: 73 bpm, Respiratory Rate: 18 breaths/min, Blood Pressure: 126/84 mmHg. Respiratory normal breathing without difficulty. Psychiatric this patient is able to make decisions and demonstrates good insight into disease process. Alert and Oriented x 3. pleasant and cooperative. General Notes: Upon inspection patient's wound bed actually showed signs of improvement this is actually measuring smaller and looking much better. Fortunately I do not see any evidence of active infection or worsening overall and I believe that the patient is making good headway here towards closure. Integumentary (Hair, Skin) Wound #7 status is Open. Original cause of wound was Gradually Appeared. The date acquired was: 06/06/2023. The wound has been in treatment 14 weeks. The wound is located on the Right,Lateral Lower Leg. The wound measures 3cm length x 2.8cm width x 0.2cm  depth; 6.597cm^2 area and 1.319cm^3 volume. There is Fat Layer (Subcutaneous Tissue) exposed. There is a medium amount of serosanguineous drainage noted. There is small (1-33%) red, pink granulation within the wound bed. There  is a medium (34-66%) amount of necrotic tissue within the wound bed including Adherent Slough. 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 There was a Excisional Skin/Subcutaneous Tissue Debridement with a total area of 6.59 sq cm performed by Allen Derry, PA-C. With the following instrument(s): Curette to remove Viable and Non-Viable tissue/material. Material removed includes Subcutaneous Tissue, Slough, and Biofilm after achieving pain control using Lidocaine 4% Topical Solution. No specimens were taken. A time out was conducted at 10:21, prior to the start of the procedure. A Minimum amount of bleeding was controlled with Pressure. The procedure was tolerated well with a pain level of 0 throughout and a pain level of 0 following the procedure. Post Debridement Measurements: 3cm length x 2.8cm width x 0.2cm depth; 1.319cm^3 volume. Character of Wound/Ulcer Post Debridement is stable. Severity of Tissue Post Debridement is: Fat layer exposed. Post procedure Diagnosis Wound #7: Same as Pre-Procedure Pre-procedure diagnosis of Wound #7 is a Venous Leg Ulcer located on the Right,Lateral Lower Leg . There was a Four Layer Compression Therapy Procedure by Midge Aver, RN. Post procedure Diagnosis Wound #7: Same as Pre-Procedure Plan Follow-up Appointments: Return Appointment in 1 week. Nurse Visit as needed Bathing/ Shower/ Hygiene: May shower with wound dressing protected with water repellent cover or cast protector. No tub bath. Anesthetic (Use 'Patient Medications' Section for Anesthetic Order  Entry): AVNEET, BARBATI [name] ([id number]) [id number] Page 10 of 11 Lidocaine applied to wound bed WOUND #7: - Lower Leg Wound Laterality: Right, Lateral Cleanser: Soap and Water 1 x Per Week/30 Days Discharge Instructions: Gently cleanse wound with antibacterial soap, rinse and pat dry prior to dressing wounds Cleanser: Vashe 5.8 (oz) 1 x Per Week/30 Days Discharge Instructions: Use vashe 5.8 (oz) as directed Prim Dressing: Hydrofera Blue Ready Transfer Foam, 2.5x2.5 (in/in) 1 x Per Week/30 Days ary Discharge Instructions: Apply Hydrofera Blue Ready to wound bed as directed Secondary Dressing: Zetuvit Plus 4x8 (in/in) 1 x Per Week/30 Days Com pression Wrap: Urgo K2, two layer compression system, large 1 x Per Week/30 Days 1. Based on what I am seeing I do believe that the patient is making excellent headway here towards getting her wound is closed I think the Hydrofera Blue is doing a good job and I think that the debridements along with the antibiotics have done extremely well for her. Fortunately I do not see any signs of systemic infection which is great news locally looks good as well. 2. I am going to recommend as well that the patient should continue with the [facility] along with the Zetuvit to cover and the Urgo K2 compression wrap. We will see patient back for reevaluation in 1 week here in the clinic. If anything worsens or changes patient will contact our office for additional recommendations. Electronic Signature(s) Signed: 10/03/2023 10:33:40 AM By: Allen Derry PA-C Entered By: Allen Derry on 10/03/2023 10:33:39 -------------------------------------------------------------------------------- SuperBill Details Patient Name: Date of Service: [name], Jashay [name]. 10/03/2023 Medical Record  Number: [id number] Patient Account Number: [id number] Date of Birth/Sex: Treating RN: 02-10-1981 (42 y.o. [name] Primary Care Provider: PA Zenovia Jordan, NO Other  Clinician: Referring Provider: Treating Provider/Extender: Allen Derry Self, Referral Weeks in Treatment: 14 Diagnosis Coding ICD-10 Codes Code Description [phone] Non-pressure chronic ulcer of other part of right lower leg with fat layer exposed I87.311 Chronic venous hypertension (idiopathic) with ulcer of right lower extremity I89.0 Lymphedema, not elsewhere classified L03.115 Cellulitis of right lower limb Facility Procedures : CPT4 Code: [id number] Description: 11042 - DEB SUBQ TISSUE 20 SQ CM/< ICD-10 Diagnosis Description L97.812 Non-pressure chronic ulcer of other part of right lower leg with fat layer expo Modifier: sed Quantity: 1 Physician Procedures : CPT4 Code Description Modifier [id number] 11042 - WC PHYS SUBQ TISS 20 SQ CM ICD-10 Diagnosis Description L97.812 Non-pressure chronic ulcer of other part of right lower leg with fat layer exposed Quantity: 1 Electronic Signature(s) Signed: 10/03/2023 10:34:30 AM By: Wendi Maya, Loryn [name] 10/03/2023 10:34:30 AM By: Allen Derry PA-C Signed: ([id number]) [id number] Page 11 of 11 Entered By: Allen Derry on 10/03/2023 10:34:30

## 2023-10-05 NOTE — Progress Notes (Signed)
[name] ([id number]) [id number] Page 1 of 9 Visit Report for 10/03/2023 Arrival Information Details Patient Name: Date of Service: [name] 10/03/2023 9:30 A M Medical Record Number: [id number] Patient Account Number: [id number] Date of Birth/Sex: Treating RN: 04/21/1981 (42 y.o. [name] Primary Care Lam Bjorklund: PA Zenovia Jordan, NO Other Clinician: Referring Torrie Namba: Treating Mckinlee Dunk/Extender: Allen Derry Self, Referral Weeks in Treatment: 14 Visit Information History Since Last Visit Added or deleted any medications: No Patient Arrived: Ambulatory Any new allergies or adverse reactions: No Arrival Time: 09:26 Has Dressing in Place as Prescribed: Yes Accompanied By: self Has Compression in Place as Prescribed: Yes Transfer Assistance: None Pain Present Now: No Patient Identification Verified: Yes Secondary Verification Process Completed: Yes Patient Requires Transmission-Based Precautions: No Patient Has Alerts: No Electronic Signature(s) Signed: 10/04/2023 5:33:20 PM By: Midge Aver MSN RN CNS WTA Entered By: Midge Aver on 10/03/2023 09:47:47 -------------------------------------------------------------------------------- Clinic Level of Care Assessment Details Patient Name: Date of Service: [name], [name] 10/03/2023 9:30 A M Medical Record Number: [id number] Patient Account Number: [id number] Date of Birth/Sex: Treating RN: 10/23/1981 (42 y.o. [name] Primary Care Rylan Bernard: PA Zenovia Jordan, NO Other Clinician: Referring Jamella Grayer: Treating Dawnetta Copenhaver/Extender: Allen Derry Self, Referral Weeks in Treatment: 14 Clinic Level of Care Assessment Items TOOL 1 Quantity Score []  - 0 Use when EandM and Procedure is performed on INITIAL visit ASSESSMENTS - Nursing Assessment / Reassessment []  - 0 General Physical Exam (combine w/ comprehensive assessment (listed just below) when performed on new pt. evals) []  - 0 Comprehensive  Assessment (HX, ROS, Risk Assessments, Wounds Hx, etc.) ASSESSMENTS - Wound and Skin Assessment / Reassessment []  - 0 Dermatologic / Skin Assessment (not related to wound area) ASSESSMENTS - Ostomy and/or Continence Assessment and Care [name] ([id number]) [id number] Page 2 of 9 []  - 0 Incontinence Assessment and Management []  - 0 Ostomy Care Assessment and Management (repouching, etc.) PROCESS - Coordination of Care []  - 0 Simple Patient / Family Education for ongoing care []  - 0 Complex (extensive) Patient / Family Education for ongoing care []  - 0 Staff obtains Chiropractor, Records, T Results / Process Orders est []  - 0 Staff telephones HHA, Nursing Homes / Clarify orders / etc []  - 0 Routine Transfer to another Facility (non-emergent condition) []  - 0 Routine [name] Admission (non-emergent condition) []  - 0 New Admissions / Manufacturing engineer / Ordering NPWT Apligraf, etc. , []  - 0 Emergency [name] Admission (emergent condition) PROCESS - Special Needs []  - 0 Pediatric / Minor Patient Management []  - 0 Isolation Patient Management []  - 0 Hearing / Language / Visual special needs []  - 0 Assessment of Community assistance (transportation, D/C planning, etc.) []  - 0 Additional assistance / Altered mentation []  - 0 Support Surface(s) Assessment (bed, cushion, seat, etc.) INTERVENTIONS - Miscellaneous []  - 0 External ear exam []  - 0 Patient Transfer (multiple staff / Nurse, adult / Similar devices) []  - 0 Simple Staple / Suture removal (25 or less) []  - 0 Complex Staple / Suture removal (26 or more) []  - 0 Hypo/Hyperglycemic Management (do not check if billed separately) []  - 0 Ankle / Brachial Index (ABI) - do not check if billed separately Has the patient been seen at the [name] within the last three years: Yes Total Score: 0 Level Of Care: ____ Electronic Signature(s) Signed: 10/04/2023 5:33:20 PM By: Midge Aver  MSN RN CNS WTA Entered By: Midge Aver on 10/03/2023 10:24:53 -------------------------------------------------------------------------------- Compression Therapy Details Patient Name: Date of  Service: [name], [name] [name]. 10/03/2023 9:30 A M Medical Record Number: [id number] Patient Account Number: [id number] Date of Birth/Sex: Treating RN: 1980-12-23 (42 y.o. [name] Primary Care Zhyon Antenucci: PA Zenovia Jordan, NO Other Clinician: Referring Nuriya Stuck: Treating Menno Vanbergen/Extender: Allen Derry Self, Referral Weeks in Treatment: 14 Compression Therapy Performed for Wound Assessment: Wound #7 Right,Lateral Lower Leg Performed By: [name], RN Compression Type: [place] Patricia ([id number]) [id number] Page 3 of 9 Post Procedure Diagnosis Same as Pre-procedure Electronic Signature(s) Signed: 10/04/2023 5:33:20 PM By: Midge Aver MSN RN CNS WTA Entered By: Midge Aver on 10/03/2023 10:22:30 -------------------------------------------------------------------------------- Encounter Discharge Information Details Patient Name: Date of Service: [name], [name] 10/03/2023 9:30 A M Medical Record Number: [id number] Patient Account Number: [id number] Date of Birth/Sex: Treating RN: [birth year] (42 y.o. [name] Primary Care Anaiyah Anglemyer: PA Zenovia Jordan, NO Other Clinician: Referring Roshun Klingensmith: Treating Kent Riendeau/Extender: Allen Derry Self, Referral Weeks in Treatment: 15 Encounter Discharge Information Items Post Procedure Vitals Discharge Condition: Stable Temperature (F): 97.8 Ambulatory Status: Ambulatory Pulse (bpm): 73 Discharge Destination: Home Respiratory Rate (breaths/min): 18 Transportation: Private Auto Blood Pressure (mmHg): 126/84 Accompanied By: self Schedule Follow-up Appointment: Yes Clinical Summary of Care: Electronic Signature(s) Signed: 10/04/2023 5:33:20 PM By: Midge Aver MSN RN CNS WTA Entered By: Midge Aver on 10/03/2023  10:40:34 -------------------------------------------------------------------------------- Lower Extremity Assessment Details Patient Name: Date of Service: [name], [name] 10/03/2023 9:30 A M Medical Record Number: [id number] Patient Account Number: [id number] Date of Birth/Sex: Treating RN: 06-01-1981 (42 y.o. [name] Primary Care Dustin Burrill: PA Zenovia Jordan, NO Other Clinician: Referring Stanislawa Gaffin: Treating Nyara Capell/Extender: Allen Derry Self, Referral Weeks in Treatment: 14 Edema Assessment Assessed: [Left: No] [Right: Yes] Edema: [Left: Ye] [Right: s] Calf Left: RightTAWNIE, BATTERSON [name] ([id number]) [id number] Page 4 of 9 Point of Measurement: 38 cm From Medial Instep 57 cm Ankle Left: Right: Point of Measurement: 13 cm From Medial Instep 34 cm Vascular Assessment Pulses: Dorsalis Pedis Palpable: [Right:Yes] Extremity colors, hair growth, and conditions: Extremity Color: [Right:Hyperpigmented] Hair Growth on Extremity: [Right:Yes] Temperature of Extremity: [Right:Warm] Capillary Refill: [Right:< 3 seconds] Dependent Rubor: [Right:No] Blanched when Elevated: [Right:No No] Toe Nail Assessment Left: Right: Thick: Yes Discolored: Yes Deformed: Yes Improper Length and Hygiene: Yes Electronic Signature(s) Signed: 10/04/2023 5:33:20 PM By: Midge Aver MSN RN CNS WTA Entered By: Midge Aver on 10/03/2023 09:57:49 -------------------------------------------------------------------------------- Multi Wound Chart Details Patient Name: Date of Service: [name], [name] 10/03/2023 9:30 A M Medical Record Number: [id number] Patient Account Number: [id number] Date of Birth/Sex: Treating RN: 1981-04-24 (42 y.o. [name] Primary Care Ailene Royal: PA Zenovia Jordan, NO Other Clinician: Referring Keenen Roessner: Treating Emileo Semel/Extender: Allen Derry Self, Referral Weeks in Treatment: 14 Vital Signs Height(in): 73 Pulse(bpm): 73 Weight(lbs): 436 Blood  Pressure(mmHg): 126/84 Body Mass Index(BMI): 57.5 Temperature(F): 97.8 Respiratory Rate(breaths/min): 18 [7:Photos:] [N/A:N/A] Right, Lateral Lower Leg N/A N/A Wound Location: LATARA, SHELDEN [name] ([id number]) [id number] Page 5 of 9 Gradually Appeared N/A N/A Wounding Event: Venous Leg Ulcer N/A N/A Primary Etiology: Lymphedema, Hypertension N/A N/A Comorbid History: 06/06/2023 N/A N/A Date Acquired: 14 N/A N/A Weeks of Treatment: Open N/A N/A Wound Status: No N/A N/A Wound Recurrence: 3x2.8x0.2 N/A N/A Measurements L x W x D (cm) 6.597 N/A N/A A (cm) : rea 1.319 N/A N/A Volume (cm) : -90.90% N/A N/A % Reduction in A rea: -27.20% N/A N/A % Reduction in Volume: Full Thickness Without Exposed N/A N/A Classification: Support Structures Medium N/A N/A Exudate Amount: Serosanguineous N/A N/A Exudate Type: red, brown  N/A N/A Exudate Color: Small (1-33%) N/A N/A Granulation Amount: Red, Pink N/A N/A Granulation Quality: Medium (34-66%) [name]: Fat Layer (Subcutaneous Tissue): Yes N/A N/A Exposed Structures: Fascia: No Tendon: No Muscle: No Joint: No Bone: No None N/A N/A Epithelialization: Treatment Notes Electronic Signature(s) Signed: 10/04/2023 5:33:20 PM By: Midge Aver MSN RN CNS WTA Entered By: Midge Aver on 10/03/2023 10:20:33 -------------------------------------------------------------------------------- Multi-Disciplinary Care Plan Details Patient Name: Date of Service: [name], [name] 10/03/2023 9:30 A M Medical Record Number: [id number] Patient Account Number: [id number] Date of Birth/Sex: Treating RN: 14-Mar-1981 (42 y.o. [name] Primary Care Sherri Mcarthy: PA Zenovia Jordan, NO Other Clinician: Referring Kelicia Youtz: Treating Shloimy Michalski/Extender: Allen Derry Self, Referral Weeks in Treatment: 14 Active Inactive Venous Leg Ulcer Nursing Diagnoses: Actual venous Insuffiency (use after diagnosis is  confirmed) Knowledge deficit related to disease process and management Goals: Patient will maintain optimal edema control Date Initiated: 06/27/2023 Date Inactivated: 08/01/2023 Target Resolution Date: 07/28/2023 Goal Status: Met Patient/caregiver will verbalize understanding of disease process and disease management Date Initiated: 06/27/2023 Date Inactivated: 08/01/2023 Target Resolution Date: 07/28/2023 Goal Status: Met Verify adequate tissue perfusion prior to therapeutic compression application Date Initiated: 06/27/2023 Target Resolution Date: 10/27/2023 Goal Status: Active LORRIANN, GASCHLER ([id number]) [id number] Page 6 of 9 Interventions: Assess peripheral edema status every visit. Compression as ordered Provide education on venous insufficiency Treatment Activities: Non-invasive vascular studies : 06/27/2023 T ordered outside of clinic : 06/27/2023 est Therapeutic compression applied : 06/27/2023 Notes: Wound/Skin Impairment Nursing Diagnoses: Impaired tissue integrity Knowledge deficit related to ulceration/compromised skin integrity Goals: Patient/caregiver will verbalize understanding of skin care regimen Date Initiated: 06/27/2023 Date Inactivated: 08/01/2023 Target Resolution Date: 07/28/2023 Goal Status: Met Ulcer/skin breakdown will have a volume reduction of 30% by week 4 Date Initiated: 06/27/2023 Date Inactivated: 08/01/2023 Target Resolution Date: 07/28/2023 Goal Status: Met Ulcer/skin breakdown will have a volume reduction of 50% by week 8 Date Initiated: 06/27/2023 Date Inactivated: 08/22/2023 Target Resolution Date: 08/27/2023 Goal Status: Met Ulcer/skin breakdown will have a volume reduction of 80% by week 12 Date Initiated: 06/27/2023 Target Resolution Date: 10/27/2023 Goal Status: Active Interventions: Assess patient/caregiver ability to obtain necessary supplies Assess patient/caregiver ability to perform ulcer/skin care regimen upon admission and  as needed Assess ulceration(s) every visit Provide education on ulcer and skin care Treatment Activities: Skin care regimen initiated : 06/27/2023 Notes: Electronic Signature(s) Signed: 10/04/2023 5:33:20 PM By: Midge Aver MSN RN CNS WTA Entered By: Midge Aver on 10/03/2023 10:25:13 -------------------------------------------------------------------------------- Pain Assessment Details Patient Name: Date of Service: [name], [name] 10/03/2023 9:30 A M Medical Record Number: [id number] Patient Account Number: [id number] Date of Birth/Sex: Treating RN: [birth year] (42 y.o. [name] Primary Care Kamyrah Feeser: PA Zenovia Jordan, NO Other Clinician: Referring Shanece Cochrane: Treating Laverle Pillard/Extender: Allen Derry Self, Referral Weeks in Treatment: 14 Active Problems Location of Pain Severity and Description of Pain Patient Has Paino No Site Locations [place], Loraina [name] ([id number]) [id number] Page 7 of 9 Pain Management and Medication Current Pain Management: Electronic Signature(s) Signed: 10/04/2023 5:33:20 PM By: Midge Aver MSN RN CNS WTA Entered By: Midge Aver on 10/03/2023 09:48:34 -------------------------------------------------------------------------------- Patient/Caregiver Education Details Patient Name: Date of Service: [name], [name] 11/13/2024andnbsp9:30 A M Medical Record Number: [id number] Patient Account Number: [id number] Date of Birth/Gender: Treating RN: [birth year] (42 y.o. [name] Primary Care Physician: PA Zenovia Jordan, NO Other Clinician: Referring Physician: Treating Physician/Extender: Allen Derry Self, Referral Weeks in Treatment: 14 Education Assessment Education Provided To: Patient Education Topics Provided Wound/Skin Impairment: Handouts:  Caring for Your Ulcer Methods: Explain/Verbal Responses: State content correctly Electronic Signature(s) Signed: 10/04/2023 5:33:20 PM By: Midge Aver MSN RN CNS WTA Entered By: Midge Aver on 10/03/2023 10:39:32 [name] [name] ([id number]) [id number] Page 8 of 9 -------------------------------------------------------------------------------- Wound Assessment Details Patient Name: Date of Service: [name], [name] 10/03/2023 9:30 A M Medical Record Number: [id number] Patient Account Number: [id number] Date of Birth/Sex: Treating RN: [birth year] (42 y.o. [name] Primary Care Jeffie Widdowson: PA Zenovia Jordan, NO Other Clinician: Referring Niyonna Betsill: Treating Eoghan Belcher/Extender: Allen Derry Self, Referral Weeks in Treatment: 14 Wound Status Wound Number: 7 Primary Etiology: Venous Leg Ulcer Wound Location: Right, Lateral Lower Leg Wound Status: Open Wounding Event: Gradually Appeared Comorbid History: Lymphedema, Hypertension Date Acquired: 06/06/2023 Weeks Of Treatment: 14 Clustered Wound: No Photos Wound Measurements Length: (cm) 3 Width: (cm) 2.8 Depth: (cm) 0.2 Area: (cm) 6.597 Volume: (cm) 1.319 % Reduction in Area: -90.9% % Reduction in Volume: -27.2% Epithelialization: None Wound Description Classification: Full Thickness Without Exposed Support Structures Exudate Amount: Medium Exudate Type: Serosanguineous Exudate Color: red, brown Foul Odor After Cleansing: No Slough/Fibrino Yes Wound Bed Granulation Amount: Small (1-33%) Exposed Structure Granulation Quality: Red, Pink Fascia Exposed: No Necrotic Amount: Medium (34-66%) Fat Layer (Subcutaneous Tissue) Exposed: Yes Necrotic Quality: Adherent Slough Tendon Exposed: No Muscle Exposed: No Joint Exposed: No Bone Exposed: No Treatment Notes Wound #7 (Lower Leg) Wound Laterality: Right, Lateral Cleanser Soap and Water Discharge Instruction: Gently cleanse wound with antibacterial soap, rinse and pat dry prior to dressing wounds Vashe 5.8 (oz) Likins, Daysia [name] ([id number]) [id number] Page 9 of 9 Discharge Instruction: Use vashe 5.8 (oz) as  directed Peri-Wound Care Topical Primary Dressing Hydrofera Blue Ready Transfer Foam, 2.5x2.5 (in/in) Discharge Instruction: Apply Hydrofera Blue Ready to wound bed as directed Secondary Dressing Zetuvit Plus 4x8 (in/in) Secured With Compression Wrap Urgo K2, two layer compression system, large Compression Stockings Add-Ons Electronic Signature(s) Signed: 10/04/2023 5:33:20 PM By: Midge Aver MSN RN CNS WTA Entered By: Midge Aver on 10/03/2023 09:55:38 -------------------------------------------------------------------------------- Vitals Details Patient Name: Date of Service: [name], Ciara [name]. 10/03/2023 9:30 A M Medical Record Number: [id number] Patient Account Number: [id number] Date of Birth/Sex: Treating RN: March 20, 1981 (42 y.o. [name] Primary Care Avantika Shere: PA Zenovia Jordan, NO Other Clinician: Referring Lillith Mcneff: Treating Emerick Weatherly/Extender: Allen Derry Self, Referral Weeks in Treatment: 14 Vital Signs Time Taken: 09:47 Temperature (F): 97.8 Height (in): 73 Pulse (bpm): 73 Weight (lbs): 436 Respiratory Rate (breaths/min): 18 Body Mass Index (BMI): 57.5 Blood Pressure (mmHg): 126/84 Reference Range: 80 - 120 mg / dl Electronic Signature(s) Signed: 10/04/2023 5:33:20 PM By: Midge Aver MSN RN CNS WTA Entered By: Midge Aver on 10/03/2023 09:48:14

## 2023-10-10 ENCOUNTER — Encounter: Payer: Self-pay | Admitting: Physician Assistant

## 2023-10-10 DIAGNOSIS — L97812 Non-pressure chronic ulcer of other part of right lower leg with fat layer exposed: Secondary | ICD-10-CM | POA: Diagnosis not present

## 2023-10-10 DIAGNOSIS — I10 Essential (primary) hypertension: Secondary | ICD-10-CM | POA: Diagnosis not present

## 2023-10-10 DIAGNOSIS — L03115 Cellulitis of right lower limb: Secondary | ICD-10-CM | POA: Diagnosis not present

## 2023-10-10 DIAGNOSIS — I872 Venous insufficiency (chronic) (peripheral): Secondary | ICD-10-CM | POA: Diagnosis not present

## 2023-10-10 DIAGNOSIS — I87311 Chronic venous hypertension (idiopathic) with ulcer of right lower extremity: Secondary | ICD-10-CM | POA: Diagnosis not present

## 2023-10-10 DIAGNOSIS — I890 Lymphedema, not elsewhere classified: Secondary | ICD-10-CM | POA: Diagnosis not present

## 2023-10-10 NOTE — Progress Notes (Addendum)
[name] [name] ([id number]) [id number] Page 1 of 11 Visit Report for 10/10/2023 Chief Complaint Document Details Patient Name: Date of Service: [name], [name] 10/10/2023 8:30 A M Medical Record Number: [id number] Patient Account Number: [id number] Date of Birth/Sex: Treating RN: Dec 20, 1980 (42 y.o. [name] Primary Care Provider: PA Zenovia Jordan, [place] Other Clinician: Referring Provider: Treating Provider/Extender: Allen Derry Self, Referral Weeks in Treatment: 15 Information Obtained from: Patient Chief Complaint 06/27/2023; right lower extremity wound Electronic Signature(s) Signed: 10/10/2023 9:01:15 AM By: Allen Derry PA-C Entered By: Allen Derry on 10/10/2023 09:01:15 -------------------------------------------------------------------------------- Debridement Details Patient Name: Date of Service: [name], [name] 10/10/2023 8:30 A M Medical Record Number: [id number] Patient Account Number: [id number] Date of Birth/Sex: Treating RN: 04/24/1981 (42 y.o. [name] Primary Care Provider: PA Zenovia Jordan, NO Other Clinician: Referring Provider: Treating Provider/Extender: Allen Derry Self, Referral Weeks in Treatment: 15 Debridement Performed for Assessment: Wound #7 Right,Lateral Lower Leg Performed By: Physician Allen Derry, PA-C The following information was scribed by: Midge Aver The information was scribed for: Allen Derry Debridement Type: Debridement Severity of Tissue Pre Debridement: Fat layer exposed Level of Consciousness (Pre-procedure): Awake and Alert Pre-procedure Verification/Time Out Yes - 09:04 Taken: Start Time: 09:04 Pain Control: Lidocaine 4% T opical Solution Percent of Wound Bed Debrided: 100% T Area Debrided (cm): otal 4.51 Tissue and other material debrided: Viable, Non-Viable, Slough, Subcutaneous, Biofilm, Slough Level: Skin/Subcutaneous Tissue Debridement Description: Excisional Instrument: Curette Bleeding:  Minimum [name], [name] ([id number]) [id number] Page 2 of 11 Hemostasis Achieved: Pressure Procedural Pain: 0 Post Procedural Pain: 0 Response to Treatment: Procedure was tolerated well Level of Consciousness (Post- Awake and Alert procedure): Post Debridement Measurements of Total Wound Length: (cm) 2.5 Width: (cm) 2.3 Depth: (cm) 0.2 Volume: (cm) 0.903 Character of Wound/Ulcer Post Debridement: Stable Severity of Tissue Post Debridement: Fat layer exposed Post Procedure Diagnosis Same as Pre-procedure Electronic Signature(s) Signed: 10/11/2023 1:29:03 PM By: Allen Derry PA-C Signed: 10/12/2023 12:46:12 PM By: Midge Aver MSN RN CNS WTA Entered By: Midge Aver on 10/10/2023 09:05:07 -------------------------------------------------------------------------------- HPI Details Patient Name: Date of Service: [name], [name] 10/10/2023 8:30 A M Medical Record Number: [id number] Patient Account Number: [id number] Date of Birth/Sex: Treating RN: 07-11-1981 (42 y.o. [name] Primary Care Provider: PA Zenovia Jordan, NO Other Clinician: Referring Provider: Treating Provider/Extender: Allen Derry Self, Referral Weeks in Treatment: 15 History of Present Illness HPI Description: 42 year old patient well known to our [facility] wound care clinic where she has been seen since 2016 for bilateral lower extremity venous insufficiency disease with lymphedema and multiple ulcerations associated with morbid obesity. she had custom-made compression stockings and lymphedema pumps which were used in the past. most recently she was admitted to the hospital between October 11 and 09/02/2017 with sepsis, lower extremity wounds and lymphedema.she was initially treated in the outpatient with Keflex and Bactrim. she was initially treated in the hospital with vancomycin and Zosyn and changed over to Unasyn until her white count improved and her blood cultures were negative for 3  days. After her inpatient management she was discharged home on Augmentin to end on 09/13/2017 with a 14 day course. she has had outpatient vascular duplex scans completed in November 2017 and her right ABI was 1.1 and the left ABI is 1.3. she had normal toe brachial indices bilaterally.she had three-vessel runoff in the right lower extremity and two-vessel runoff in the left lower extremity. On questioning the patient she does have custom made compression stockings and also has a  lymphedema pump but has not been using it appropriately and has not been taking good care of herself. 09/17/2017 -- she returns today with compression stockings on the left side and the right side has had significant amount of drainage and has a very strong odor 09/24/2017 -- the drainage is increased significantly and she has more lymphedema and a very strong odor to her wound. Though she does not have systemic symptoms, or overt infection I believe she will benefit from some doxycycline given empirically. 10/01/2017 -- after starting the doxycycline and changing the dressing twice a week her symptoms and signs have definitely improved overall. 10/08/2017 -- she has completed her course of doxycycline but continues to have a lot of drainage and needs twice a week dressing changes. 11/08/17-she is here in follow-up evaluation for right lower extremity ulcers. She admits to using her lymphedema pumps twice daily, one hour per session. she is voicing no complaints or concerns, no signs of infection will change to [facility] 12/14/17 on evaluation today patient appears to be doing very well in regard to her wounds. She has been tolerating the dressing changes she continues to develop some portly the adherent granular tissue on the surface of the wound with some Slough. Obviously we are trying to get too much better wound bed. With that being said the hyper granulation the Hydrofera Blue Dressing to have helped with which is  excellent news. However I think it may be time to try something a little bit different at this point. 01/11/18 on evaluation today patient appears to be doing fairly well in regard to her right lateral lower extremity ulcers. This shows excellent signs of filling in [place], [name] ([id number]) [id number] Page 3 of 11 which is great news. There does not appear to be any evidence of infection which is also good news. She does continue to work as well is good school. She is having no pain. 01/22/18 on evaluation today patient appears to be doing a little bit worse in my opinion in regard to the overall quality of that granulation on her right lower extremity. She was not here last week due to being sick with a stomach virus this may have something to do with the fact that her wound appears to be a little bit worse. With that being said I'm also thinking that after switching from the [facility] Dressing to the silver collagen would really has not looked that's good in my opinion. We may want to swit 02/11/18 on evaluation today patient's right lower/lateral lower extremity ulcers appear to be doing very well at this point. Especially the more proximal ulcer has filled in much closer to surface which is good news. Nonetheless both show signs of improvement which is great news. There does not appear to be any evidence of infection which is also good news. In general patient has been doing well tolerating the wraps as well as the [employer]. 02/18/18 on evaluation today patient appears to be doing a little bit worse in regard to the periwound region the wounds themselves do not look much deteriorated to me. With that being said she has several small blisters/pustules noted in the periwound and there was a significant amount of drainage and maceration compared to previous. There has been a time that we had to bring her back for twice a week dressing changes as far as her  wrap was concerned it has been a while since we've done that however. With that being said the  patient has been having some burning and in general I'm concerned about the possibility of infection. She has previously taken doxycycline with good result. Fortunately there does not appear to be any evidence of overall worsening in regard to the size of the wound and in fact the upper wound actually appears to be showing signs of good epithelialization. 03/18/18 on evaluation today patient appears to be doing excellent in regard to her right lateral lower extremity ulcer. She has been tolerating the dressing changes without complication. Fortunately this seems to be making great progress. Overall I see no signs of infection and there is dramatic improvement overall even compared to last week. 03/28/18 on evaluation today patient appears to be doing very well in regard to her right lateral lower surety ulcer. She has been tolerating the dressing changes without complication at this point. She states currently that she's having no significant discomfort which is excellent as well. Overall I'm pleased with how things seem to be progressing. 04/01/18 on evaluation today patient appears to be doing excellent in regard to her right lateral lower extremity ulcers. She has been tolerating the dressing changes without complication which is good news. With that being said the wraps still continues to show signs of helping with her fluid she does have Juxta-Lite compression stockings for when we are done with the current treatment regimen once everything heals. Mainly she just has the one area still remaining the smaller of the two wings is pretty much closed at this point there's just a very slight opening noted. 04/08/18 on evaluation today patient appears to be doing better in regard to the original wound on the right lateral lower extremity that we have been managing. Unfortunately she has a new area of weeping more  anterior on the right lateral lower extremity and on the left lower extremity she has two new ulcers there appears to be some cellulitis noted at this time. I am concerned about the fact that this may in fact be an infection that has caused the worsening and swelling in the past this has been the case when we previously attempted to determine what was going on when she had down slides like this. With that being said the patient is seeming to tolerate the wraps fairly well for the most part. 04/17/18; since last time the patient was seen in this clinic she was hospitalized from 04/11/18 through 04/13/18; she presented with bilateral lower extremity pain worse on the right and a fever of up to 104. Noteworthy that when she was in the clinic last week she had new wounds on the left leg culture grew MRSA and she was prescribed Bactrim. She had 2 days of IV Vanco and Zosyn in the hospital. Her blood cultures were negative. She was discharged on Bactrim to cover the original MRSA on the right leg and Keflex to cover the possibility of strep. The hospitalist had a conversation with infectious disease. The patient arrives in clinic today for nurse check however given the recent hospitalization I was asked to look at her. The patient states she feels a lot better. No fever or chills. Still some pain in the right calf but a lot better. She arrived in the hospital with a white count of 15.6, the next day was 10.8. Comprehensive metabolic panel was normal. She is still taking Keflex and Bactrim It would appear that she had a surgical IandD at the bedside of the right calf felt to have a underlying abscess. According to our  intake nurse the wound has expanded quite bit on the right lateral calf. She has no open area on the left anterior and left posterior calf as described last time 04/23/18 on evaluation today patient actually appears to be doing much better than when I last saw her. This obviously has been a couple  weeks ago and in the interim she was admitted to the hospital for IV antibiotic therapy due to cellulitis, discharge, and fortunately the substance which hadn't sued has completely resolved. Her swelling seems to be better in regard to her lower extremities as well and the wounds that opened up as results of the infection seems to be showing signs of improvement. There is some Slough noted on the left lateral wound otherwise a lot of weeping noted of the right lateral leg. 04/29/18 on evaluation today patient appears to be doing excellent in regard to her bilateral lower extremity ulcers. She's been tolerating the dressing changes without complication there does not appear to be any evidence of infection at this time. Overall I think she is made great improvement and seems to be showing signs of coming back around where she was prior to the cellulitis and sepsis episode. 05/06/18 on evaluation today patient appears to be doing better in regard to her bilateral ocean the ulcers. She's been tolerating the dressing changes without complication. Fortunately there does not appear to be any evidence of infection at this time. Her swelling is significantly down overall seems to be showing signs of good improvement as well. Fortunately I do believe that she is progressing nicely back to where she was prior to the cellulitis/sepsis issue. 05/13/18 on evaluation today patient seems to be showing signs of great improvement she's been tolerating the dressing changes without complication and overall there does not appear to be any evidence of infection. All of which is good news. The only issue she really have today is that some of the [facility] Dressing actually was stuck to the periwound of the right lateral lower extremity however this was able to be removed during the debridement without complication. 05/21/18 on evaluation today patient appears to be doing very well in regard to her bilateral lower extremity  ulcers. She has been tolerating the compression wraps without complication. There does not appear to be any evidence of infection at this point which is good news as well. Overall I'm very pleased with the progress that has been made. She likewise is very happy. 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 05/28/18 on evaluation today patient actually appears to be doing excellent in regard to her bilateral lower [place] ulcers. She's made great progress since last week and overall I'm very pleased in this regard. She states that she's not have any discomfort either which is also good news there's definitely no evidence of infection. 06/03/18 on evaluation today patient actually appears to be doing excellent in regard to her ulcerations in fact these areas on both lower extremities were almost completely healed. Unfortunately she has a situation going on her life right now she actually found her husband deceased last Oct 29, 2023. She states that she has been quite a bit upset since that time unfortunately this is obviously a very big blow to her. Nonetheless she is concerned about the funeral and having to wear certain shoes for this that she states she cannot fit in with the wraps. She wonders if she  could potentially come back on Friday to have her wraps changed and to switch into her Juxta-Lite compression at that point. I think that would be an okay thing that we could do for her. Nonetheless she fortunately seems to be tolerating the wraps very well and again has made excellent progress with the [facility] Dressing 06/11/18 on evaluation today patient appears to be doing rather well in regard to her bilateral lower extremities in fact everything appears to be completely healed at this point which is excellent news. There does not appear to be any evidence of infection at this time which is  great. In fact overall I do believe she is completely resolved in regard to her bilateral lower extremity ulcers. She is extremely happy in this regard. 06/27/2023 Ms. Joei Rathmann is a 42 year old female with a past medical history of venous insufficiency and lymphedema that presents to the clinic for a 3-week history of nonhealing wound to the right lower extremity. She states this started spontaneously. She does not wear compression stockings. She has been keeping the area covered. She currently denies signs of infection. MEGGAN, ONEEL [name] ([id number]) [id number] Page 4 of 11 8/14; patient readmitted to clinic last week. She has venous insufficiency and chronic secondary lymphedema. She has a nonhealing wound on the right lateral lower leg. She has been using gent mupirocin and Hydrofera Blue using an Urgo K2 lite wrap. Her ABI on the right leg was 0.74 on admission to clinic. Formal arterial studies were ordered but we have not yet had any contact with the patient. Also there was some question about juxta lites for this patient we have not heard anything about that either. 8/28; patient presents for follow-up. She had ABIs completed on 8/23 that showed noncompressible ABIs on the right and left with TBI of 0.88 on the left and 0.96 on the right. She had triphasic waveforms throughout the arterial system. This is reassuring that she has adequate blood flow for healing. We have been using antibiotic ointment with Hydrofera Blue under Urgo K2 lite wrap. She has tolerated this well. Wound is smaller. 9/4; patient presents for follow-up. We have been using Urgo 2 regular4-layer equivalent as well as Hydrofera Blue and antibiotic ointment. Wound is slightly smaller. She has no issues or complaints. She tolerated the wrap well. 9/11; patient presents for follow-up. We have been using Hydrofera Blue with antibiotic ointment under Urgo regular compression. She states the wrap slid  down on Sunday and she has been keeping the area covered for the past 2 to 3 days. 9/18; small wound on the right lateral lower leg this is in the setting of significant lymphedema. We have you been using gent and mupirocin with Hydrofera Blue and Urgo K2 regular compression. The wound is about the same size this week. At 1 point she got wraparound stockings/external compression stockings during her time in our [facility]. I am not sure she is actually been wearing these in fact I think not 9/25; right lateral leg in the setting of very significant stage III lymphedema. We have been using gent and mupirocin under Hydrofera Blue and using an Urgo K2 lite. 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 She tells [place] a pipe burst in her apartment she got the wrap wet while cleaning up took it off 10/9; right lateral lower leg in the setting of stage III lymphedema. Small punched-out area but with a continuously nonviable surface. I changed to her to Iodoflex last week to help with the surface although she still comes in with a lot of fibrinous debris 10/16; right lateral lower leg wound in the setting of stage III lymphedema significant hemosiderin deposition but no obvious erythema. She arrives in clinic today with a much better looking wound surface. I started her on Iodoflex 2 weeks ago and did an aggressive debridement last week. The dimensions are slightly slightly larger today probably because of the debridement, however I think the surface of the wound is quite a bit better today. 10/23; comes in today with a large amount of sanguinous drainage. This is different for  this patient. She had no debridement last week. She says that there is some more pain than she is used to. She has not been systemically unwell. We have been using Iodoflex 10/30; patient had a wound infection last week which was new for her. Culture identified to have a growth of Citrobacter Koseri. It was pansensitive including to the doxycycline I gave her but still probably not the correct choice of antibiotic. She is still having drainage. The wound has improved but still does not look like what we had a few weeks ago. 09-26-2023 upon evaluation today patient appears to be doing well currently in regard to her wound. She has been tolerating the dressing changes without complication. Fortunately there does not appear to be any signs of infection and she is making excellent progress towards complete closure. With that being said there is some need for sharp debridement today and also believe that she probably needs to have a continuation of therapy with regard to her infection. I would actually put her on Levaquin for 2 weeks based on what I am seeing from a drainage standpoint. 10-03-2023 upon evaluation today patient appears to be doing well currently in regard to her wound. She has been tolerating the dressing changes without complication. Fortunately there does not appear to be any signs of infection at this time. 10-10-2023 upon evaluation today patient appears to be doing well currently in regard to her wound. She is actually tolerating the dressing changes without complication she is making excellent progress towards healing. Fortunately I am extremely pleased with where we stand today. Electronic Signature(s) Signed: 10/10/2023 9:08:42 AM By: Allen Derry PA-C Entered By: Allen Derry on 10/10/2023 09:08:42 -------------------------------------------------------------------------------- Physical Exam Details Patient Name: Date of Service: [name], Bee [name]. 10/10/2023 8:30 A M Medical  Record Number: [id number] Patient Account Number: [id number] Date of Birth/Sex: Treating RN: [birth year] (42 y.o. [name] Primary Care Provider: PA Zenovia Jordan, [place] Other Clinician: Referring Provider: Treating Provider/Extender: Allen Derry Self, Referral Weeks in Treatment: 15 Constitutional Well-nourished and well-hydrated in no acute distress. [name], [name] ([id number]) [id number] Page 5 of 11 Respiratory normal breathing without difficulty. Psychiatric this patient is able to make decisions and demonstrates good insight into disease process. Alert and Oriented x 3. pleasant and cooperative. Notes Upon inspection patient's wound bed actually showed signs of good granulation and epithelization at this point. Fortunately I do not see any evidence of worsening overall and I believe that the patient is making headway here towards closure. No fevers, chills, nausea, vomiting, or diarrhea. Electronic Signature(s) Signed: 10/10/2023  9:08:58 AM By: Allen Derry PA-C Entered By: Allen Derry on 10/10/2023 09:08:58 -------------------------------------------------------------------------------- Physician Orders Details Patient Name: Date of Service: [name], Nicki [name]. 10/10/2023 8:30 A M Medical Record Number: [id number] Patient Account Number: [id number] Date of Birth/Sex: Treating RN: 08-Jun-1981 (42 y.o. [name] Primary Care Provider: PA TIENT, NO Other Clinician: Referring Provider: Treating Provider/Extender: Allen Derry Self, Referral Weeks in Treatment: 15 The following information was scribed by: Midge Aver The information was scribed for: Allen Derry Verbal / Phone Orders: No Diagnosis Coding ICD-10 Coding Code Description [phone] Non-pressure chronic ulcer of other part of right lower leg with fat layer exposed I87.311 Chronic venous hypertension (idiopathic) with ulcer of right lower extremity I89.0 Lymphedema, not elsewhere  classified L03.115 Cellulitis of right lower limb Follow-up Appointments Return Appointment in 1 week. Nurse Visit as needed Bathing/ Shower/ Hygiene May shower with wound dressing protected with water repellent cover or cast protector. No tub bath. Anesthetic (Use 'Patient Medications' Section for Anesthetic Order Entry) Lidocaine applied to wound bed Edema Control - Orders / Instructions Optional: One layer of unna paste to top of compression wrap (to act as an anchor). UrgoK2 - large Wound Treatment Wound #7 - Lower Leg Wound Laterality: Right, Lateral Cleanser: Soap and Water 1 x Per Week/30 Days Discharge Instructions: Gently cleanse wound with antibacterial soap, rinse and pat dry prior to dressing wounds Cleanser: Vashe 5.8 (oz) 1 x Per Week/30 Days Discharge Instructions: Use vashe 5.8 (oz) as directed Prim Dressing: Hydrofera Blue Ready Transfer Foam, 2.5x2.5 (in/in) 1 x Per Week/30 Days ary Discharge Instructions: Apply Hydrofera Blue Ready to wound bed as directed [name], [name] ([id number]) [id number] Page 6 of 11 Secondary Dressing: Zetuvit Plus 4x8 (in/in) 1 x Per Week/30 Days Compression Wrap: Urgo K2, two layer compression system, large 1 x Per Week/30 Days Electronic Signature(s) Signed: 10/11/2023 1:29:03 PM By: Allen Derry PA-C Signed: 10/12/2023 12:46:12 PM By: Midge Aver MSN RN CNS WTA Entered By: Midge Aver on 10/10/2023 09:07:19 -------------------------------------------------------------------------------- Problem List Details Patient Name: Date of Service: [name], Kina [name]. 10/10/2023 8:30 A M Medical Record Number: [id number] Patient Account Number: [id number] Date of Birth/Sex: Treating RN: 29-Aug-1981 (42 y.o. [name] Primary Care Provider: PA TIENT, NO Other Clinician: Referring Provider: Treating Provider/Extender: Allen Derry Self, Referral Weeks in Treatment: 15 Active  Problems ICD-10 Encounter Code Description Active Date MDM Diagnosis L97.812 Non-pressure chronic ulcer of other part of right lower leg with fat layer 06/27/2023 No Yes exposed I87.311 Chronic venous hypertension (idiopathic) with ulcer of right lower extremity 06/27/2023 No Yes I89.0 Lymphedema, not elsewhere classified 06/27/2023 No Yes L03.115 Cellulitis of right lower limb 09/12/2023 No Yes Inactive Problems Resolved Problems Electronic Signature(s) Signed: 10/10/2023 9:01:01 AM By: Allen Derry PA-C Entered By: Allen Derry on 10/10/2023 09:01:01 Settlemyre, Collene [name] ([id number]) [id number] Page 7 of 11 -------------------------------------------------------------------------------- Progress Note Details Patient Name: Date of ServiceClint [facility]s [facility] [employer], Kielyn [name]. 10/10/2023 8:30 A M Medical Record Number: [id number] Patient Account Number: [id number] Date of Birth/Sex: Treating RN: [birth year] (42 y.o. [name] Primary Care Provider: PA TIENT, NO Other Clinician: Referring Provider: Treating Provider/Extender: Allen Derry Self, Referral Weeks in Treatment: 15 Subjective Chief Complaint Information obtained from Patient 06/27/2023; right lower extremity wound History of Present Illness (HPI) 42 year old patient well known to our [facility] wound care clinic where she has been seen since 2016 for bilateral lower extremity venous insufficiency disease with lymphedema and multiple ulcerations associated with morbid obesity. she had custom-made compression  stockings and lymphedema pumps which were used in the past. most recently she was admitted to the hospital between October 11 and 09/02/2017 with sepsis, lower extremity wounds and lymphedema.she was initially treated in the outpatient with Keflex and Bactrim. she was initially treated in the hospital with vancomycin and Zosyn and changed over to Unasyn until her white count improved and her blood cultures were negative  for 3 days. After her inpatient management she was discharged home on Augmentin to end on 09/13/2017 with a 14 day course. she has had outpatient vascular duplex scans completed in November 2017 and her right ABI was 1.1 and the left ABI is 1.3. she had normal toe brachial indices bilaterally.she had three-vessel runoff in the right lower extremity and two-vessel runoff in the left lower extremity. On questioning the patient she does have custom made compression stockings and also has a lymphedema pump but has not been using it appropriately and has not been taking good care of herself. 09/17/2017 -- she returns today with compression stockings on the left side and the right side has had significant amount of drainage and has a very strong odor 09/24/2017 -- the drainage is increased significantly and she has more lymphedema and a very strong odor to her wound. Though she does not have systemic symptoms, or overt infection I believe she will benefit from some doxycycline given empirically. 10/01/2017 -- after starting the doxycycline and changing the dressing twice a week her symptoms and signs have definitely improved overall. 10/08/2017 -- she has completed her course of doxycycline but continues to have a lot of drainage and needs twice a week dressing changes. 11/08/17-she is here in follow-up evaluation for right lower extremity ulcers. She admits to using her lymphedema pumps twice daily, one hour per session. she is voicing no complaints or concerns, no signs of infection will change to [facility] 12/14/17 on evaluation today patient appears to be doing very well in regard to her wounds. She has been tolerating the dressing changes she continues to develop some portly the adherent granular tissue on the surface of the wound with some Slough. Obviously we are trying to get too much better wound bed. With that being said the hyper granulation the Hydrofera Blue Dressing to have helped with which is  excellent news. However I think it may be time to try something a little bit different at this point. 01/11/18 on evaluation today patient appears to be doing fairly well in regard to her right lateral lower extremity ulcers. This shows excellent signs of filling in which is great news. There does not appear to be any evidence of infection which is also good news. She does continue to work as well is good school. She is having no pain. 01/22/18 on evaluation today patient appears to be doing a little bit worse in my opinion in regard to the overall quality of that granulation on her right lower extremity. She was not here last week due to being sick with a stomach virus this may have something to do with the fact that her wound appears to be a little bit worse. With that being said I'm also thinking that after switching from the [facility] Dressing to the silver collagen would really has not looked that's good in my opinion. We may want to swit 02/11/18 on evaluation today patient's right lower/lateral lower extremity ulcers appear to be doing very well at this point. Especially the more proximal ulcer has filled in much closer to surface  which is good news. Nonetheless both show signs of improvement which is great news. There does not appear to be any evidence of infection which is also good news. In general patient has been doing well tolerating the wraps as well as the [employer]. 02/18/18 on evaluation today patient appears to be doing a little bit worse in regard to the periwound region the wounds themselves do not look much deteriorated to me. With that being said she has several small blisters/pustules noted in the periwound and there was a significant amount of drainage and maceration compared to previous. There has been a time that we had to bring her back for twice a week dressing changes as far as her wrap was concerned it has been a while since we've done that however. With that  being said the patient has been having some burning and in general I'm concerned about the possibility of infection. She has previously taken doxycycline with good result. Fortunately there does not appear to be any evidence of overall worsening in regard to the size of the wound and in fact the upper wound actually appears to be showing signs of good epithelialization. 03/18/18 on evaluation today patient appears to be doing excellent in regard to her right lateral lower extremity ulcer. She has been tolerating the dressing changes without complication. Fortunately this seems to be making great progress. Overall I see no signs of infection and there is dramatic improvement overall even compared to last week. 03/28/18 on evaluation today patient appears to be doing very well in regard to her right lateral lower surety ulcer. She has been tolerating the dressing changes without complication at this point. She states currently that she's having no significant discomfort which is excellent as well. Overall I'm pleased with how things seem to be progressing. [name], [name] ([id number]) [id number] Page 8 of 11 04/01/18 on evaluation today patient appears to be doing excellent in regard to her right lateral lower extremity ulcers. She has been tolerating the dressing changes without complication which is good news. With that being said the wraps still continues to show signs of helping with her fluid she does have Juxta-Lite compression stockings for when we are done with the current treatment regimen once everything heals. Mainly she just has the one area still remaining the smaller of the two wings is pretty much closed at this point there's just a very slight opening noted. 04/08/18 on evaluation today patient appears to be doing better in regard to the original wound on the right lateral lower extremity that we have been managing. Unfortunately she has a new area of weeping more  anterior on the right lateral lower extremity and on the left lower extremity she has two new ulcers there appears to be some cellulitis noted at this time. I am concerned about the fact that this may in fact be an infection that has caused the worsening and swelling in the past this has been the case when we previously attempted to determine what was going on when she had down slides like this. With that being said the patient is seeming to tolerate the wraps fairly well for the most part. 04/17/18; since last time the patient was seen in this clinic she was hospitalized from 04/11/18 through 04/13/18; she presented with bilateral lower extremity pain worse on the right and a fever of up to 104. Noteworthy that when she was in the clinic last week she had new wounds on the left leg culture  grew MRSA and she was prescribed Bactrim. She had 2 days of IV Vanco and Zosyn in the hospital. Her blood cultures were negative. She was discharged on Bactrim to cover the original MRSA on the right leg and Keflex to cover the possibility of strep. The hospitalist had a conversation with infectious disease. The patient arrives in clinic today for nurse check however given the recent hospitalization I was asked to look at her. The patient states she feels a lot better. No fever or chills. Still some pain in the right calf but a lot better. She arrived in the hospital with a white count of 15.6, the next day was 10.8. Comprehensive metabolic panel was normal. She is still taking Keflex and Bactrim It would appear that she had a surgical IandD at the bedside of the right calf felt to have a underlying abscess. According to our intake nurse the wound has expanded quite bit on the right lateral calf. She has no open area on the left anterior and left posterior calf as described last time 04/23/18 on evaluation today patient actually appears to be doing much better than when I last saw her. This obviously has been a couple  weeks ago and in the interim she was admitted to the hospital for IV antibiotic therapy due to cellulitis, discharge, and fortunately the substance which hadn't sued has completely resolved. Her swelling seems to be better in regard to her lower extremities as well and the wounds that opened up as results of the infection seems to be showing signs of improvement. There is some Slough noted on the left lateral wound otherwise a lot of weeping noted of the right lateral leg. 04/29/18 on evaluation today patient appears to be doing excellent in regard to her bilateral lower extremity ulcers. She's been tolerating the dressing changes without complication there does not appear to be any evidence of infection at this time. Overall I think she is made great improvement and seems to be showing signs of coming back around where she was prior to the cellulitis and sepsis episode. 05/06/18 on evaluation today patient appears to be doing better in regard to her bilateral ocean the ulcers. She's been tolerating the dressing changes without complication. Fortunately there does not appear to be any evidence of infection at this time. Her swelling is significantly down overall seems to be showing signs of good improvement as well. Fortunately I do believe that she is progressing nicely back to where she was prior to the cellulitis/sepsis issue. 05/13/18 on evaluation today patient seems to be showing signs of great improvement she's been tolerating the dressing changes without complication and overall there does not appear to be any evidence of infection. All of which is good news. The only issue she really have today is that some of the [facility] Dressing actually was stuck to the periwound of the right lateral lower extremity however this was able to be removed during the debridement without complication. 05/21/18 on evaluation today patient appears to be doing very well in regard to her bilateral lower extremity  ulcers. She has been tolerating the compression wraps without complication. There does not appear to be any evidence of infection at this point which is good news as well. Overall I'm very pleased with the progress that has been made. She likewise is very happy. 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 05/28/18 on  evaluation today patient actually appears to be doing excellent in regard to her bilateral lower [place] ulcers. She's made great progress since last week and overall I'm very pleased in this regard. She states that she's not have any discomfort either which is also good news there's definitely no evidence of infection. 06/03/18 on evaluation today patient actually appears to be doing excellent in regard to her ulcerations in fact these areas on both lower extremities were almost completely healed. Unfortunately she has a situation going on her life right now she actually found her husband deceased last 11/20/2023. She states that she has been quite a bit upset since that time unfortunately this is obviously a very big blow to her. Nonetheless she is concerned about the funeral and having to wear certain shoes for this that she states she cannot fit in with the wraps. She wonders if she could potentially come back on 20-Nov-2023 to have her wraps changed and to switch into her Juxta-Lite compression at that point. I think that would be an okay thing that we could do for her. Nonetheless she fortunately seems to be tolerating the wraps very well and again has made excellent progress with the [facility] Dressing 06/11/18 on evaluation today patient appears to be doing rather well in regard to her bilateral lower extremities in fact everything appears to be completely healed at this point which is excellent news. There does not appear to be any evidence of infection at this time which is  great. In fact overall I do believe she is completely resolved in regard to her bilateral lower extremity ulcers. She is extremely happy in this regard. 06/27/2023 Ms. [name] is a 42 year old female with a past medical history of venous insufficiency and lymphedema that presents to the clinic for a 3-week history of nonhealing wound to the right lower extremity. She states this started spontaneously. She does not wear compression stockings. She has been keeping the area covered. She currently denies signs of infection. 8/14; patient readmitted to clinic last week. She has venous insufficiency and chronic secondary lymphedema. She has a nonhealing wound on the right lateral lower leg. She has been using gent mupirocin and Hydrofera Blue using an Urgo K2 lite wrap. Her ABI on the right leg was 0.74 on admission to clinic. Formal arterial studies were ordered but we have not yet had any contact with the patient. Also there was some question about juxta lites for this patient we have not heard anything about that either. 8/28; patient presents for follow-up. She had ABIs completed on 8/23 that showed noncompressible ABIs on the right and left with TBI of 0.88 on the left and 0.96 on the right. She had triphasic waveforms throughout the arterial system. This is reassuring that she has adequate blood flow for healing. We have been using antibiotic ointment with Hydrofera Blue under Urgo K2 lite wrap. She has tolerated this well. Wound is smaller. 9/4; patient presents for follow-up. We have been using Urgo 2 regular4-layer equivalent as well as Hydrofera Blue and antibiotic ointment. Wound is slightly smaller. She has no issues or complaints. She tolerated the wrap well. 9/11; patient presents for follow-up. We have been using Hydrofera Blue with antibiotic ointment under Urgo regular compression. She states the wrap slid down on Sunday and she has been keeping the area covered for the past 2 to 3  days. 9/18; small wound on the right lateral lower leg this is in the setting of significant lymphedema. We have  you been using gent and mupirocin with Hydrofera Blue and Urgo K2 regular compression. The wound is about the same size this week. At 1 point she got wraparound stockings/external compression stockings during her time in our [place] sister clinic. I am not sure she is actually been wearing these in fact I think not 9/25; right lateral leg in the setting of very significant stage III lymphedema. We have been using gent and mupirocin under Hydrofera Blue and using an Urgo K2 lite. She works in a daycare 10/2; right lateral leg in the setting of stage III lymphedema which is marginally controlled and spite of 4-layer equivalent compression with Harlene Salts we have [name], [name] ([id number]) [id number] Page 9 of 11 been using gent and mupirocin with Hydrofera Blue, not making much progress however in the small circular wound with a very fibrinous surface. 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 She tells [place] a pipe burst in her apartment she got the wrap wet while cleaning up took it off 10/9; right lateral lower leg in the setting of stage III lymphedema. Small punched-out area but with a continuously nonviable surface. I changed to her to Iodoflex last week to help with the surface although she still comes in with a lot of fibrinous debris 10/16; right lateral lower leg wound in the setting of stage III lymphedema significant hemosiderin deposition but no obvious erythema. She arrives in clinic today with a much better looking wound surface. I started her on Iodoflex 2 weeks ago and did an aggressive debridement last week. The dimensions are slightly slightly larger today probably because of the debridement, however I think the surface of the wound is quite a bit better today. 10/23; comes in today with a large amount of sanguinous drainage. This is different  for this patient. She had no debridement last week. She says that there is some more pain than she is used to. She has not been systemically unwell. We have been using Iodoflex 10/30; patient had a wound infection last week which was new for her. Culture identified to have a growth of Citrobacter Koseri. It was pansensitive including to the doxycycline I gave her but still probably not the correct choice of antibiotic. She is still having drainage. The wound has improved but still does not look like what we had a few weeks ago. 09-26-2023 upon evaluation today patient appears to be doing well currently in regard to her wound. She has been tolerating the dressing changes without complication. Fortunately there does not appear to be any signs of infection and she is making excellent progress towards complete closure. With that being said there is some need for sharp debridement today and also believe that she probably needs to have a continuation of therapy with regard to her infection. I would actually put her on Levaquin for 2 weeks based on what I am seeing from a drainage standpoint. 10-03-2023 upon evaluation today patient appears to be doing well currently in regard to her wound. She has been tolerating the dressing changes without complication. Fortunately there does not appear to be any signs of infection at this time. 10-10-2023 upon evaluation today patient appears to be doing well currently in regard to her wound. She is actually tolerating the dressing changes without complication she is making excellent progress towards healing. Fortunately I am extremely pleased with where we stand today. Objective Constitutional Well-nourished and well-hydrated in no acute distress. Vitals Time Taken: 8:47 AM, Height: 73 in, Weight:  436 lbs, BMI: 57.5, Temperature: 97.8 F, Pulse: 87 bpm, Respiratory Rate: 18 breaths/min, Blood Pressure: 144/83 mmHg. Respiratory normal breathing without  difficulty. Psychiatric this patient is able to make decisions and demonstrates good insight into disease process. Alert and Oriented x 3. pleasant and cooperative. General Notes: Upon inspection patient's wound bed actually showed signs of good granulation and epithelization at this point. Fortunately I do not see any evidence of worsening overall and I believe that the patient is making headway here towards closure. No fevers, chills, nausea, vomiting, or diarrhea. Integumentary (Hair, Skin) Wound #7 status is Open. Original cause of wound was Gradually Appeared. The date acquired was: 06/06/2023. The wound has been in treatment 15 weeks. The wound is located on the Right,Lateral Lower Leg. The wound measures 2.5cm length x 2.3cm width x 0.2cm depth; 4.516cm^2 area and 0.903cm^3 volume. There is Fat Layer (Subcutaneous Tissue) exposed. There is a medium amount of serosanguineous drainage noted. There is small (1-33%) red, pink granulation within the wound bed. There is a medium (34-66%) amount of necrotic tissue within the wound bed including Adherent Slough. 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 There was a Excisional Skin/Subcutaneous Tissue Debridement with a total area of 4.51 sq cm performed by Allen Derry, PA-C. With the following instrument(s): Curette to remove Viable and Non-Viable tissue/material. Material removed includes Subcutaneous Tissue, Slough, and Biofilm after achieving pain control using Lidocaine 4% Topical Solution. No specimens were taken. A time out was conducted at 09:04,  prior to the start of the procedure. A Minimum amount of bleeding was controlled with Pressure. The procedure was tolerated well with a pain level of 0 throughout and a pain level of 0 following the procedure. Post Debridement Measurements: 2.5cm length x 2.3cm width x 0.2cm depth; 0.903cm^3 volume. [name], [name] ([id number]) [id number] Page 10 of 11 Character of Wound/Ulcer Post Debridement is stable. Severity of Tissue Post Debridement is: Fat layer exposed. Post procedure Diagnosis Wound #7: Same as Pre-Procedure Pre-procedure diagnosis of Wound #7 is a Venous Leg Ulcer located on the Right,Lateral Lower Leg . There was a Four Layer Compression Therapy Procedure by Midge Aver, RN. Post procedure Diagnosis Wound #7: Same as Pre-Procedure Plan Follow-up Appointments: Return Appointment in 1 week. Nurse Visit as needed Bathing/ Shower/ Hygiene: May shower with wound dressing protected with water repellent cover or cast protector. No tub bath. Anesthetic (Use 'Patient Medications' Section for Anesthetic Order Entry): Lidocaine applied to wound bed Edema Control - Orders / Instructions: Optional: One layer of unna paste to top of compression wrap (to act as an anchor). UrgoK2 - large WOUND #7: - Lower Leg Wound Laterality: Right, Lateral Cleanser: Soap and Water 1 x Per Week/30 Days Discharge Instructions: Gently cleanse wound with antibacterial soap, rinse and pat dry prior to dressing wounds Cleanser: Vashe 5.8 (oz) 1 x Per Week/30 Days Discharge Instructions: Use vashe 5.8 (oz) as directed Prim Dressing: Hydrofera Blue Ready Transfer Foam, 2.5x2.5 (in/in) 1 x Per Week/30 Days ary Discharge Instructions: Apply Hydrofera Blue Ready to wound bed as directed Secondary Dressing: Zetuvit Plus 4x8 (in/in) 1 x Per Week/30 Days Com pression Wrap: Urgo K2, two  layer compression system, large 1 x Per Week/30 Days 1. I would recommend that we have the patient  going continue to monitor for any signs of infection or worsening. Based on what I see I do believe that the patient is making headway here towards closure which is great news I would recommend we continue with the [facility] which I think is doing very well. 2. I am also can recommend that we have the patient continue to utilize the Zetuvit to cover followed by the Urgo K2 compression wrap which is doing a good job here. 3. I am going to recommend the patient should continue to elevate her legs is much as possible to help with edema control the more of this she can do the better in my opinion. We will see patient back for reevaluation in 1 week here in the clinic. If anything worsens or changes patient will contact our office for additional recommendations. Electronic Signature(s) Signed: 10/10/2023 9:09:40 AM By: Allen Derry PA-C Entered By: Allen Derry on 10/10/2023 09:09:40 -------------------------------------------------------------------------------- SuperBill Details Patient Name: Date of Service: [name], Bellamy [name]. 10/10/2023 Medical Record Number: [id number] Patient Account Number: [id number] Date of Birth/Sex: Treating RN: August 25, 1981 (42 y.o. 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 venous hypertension (idiopathic) with ulcer of right lower extremity I89.0 Lymphedema, not elsewhere classified L03.115 Cellulitis of right lower limb Facility Procedures : CPT4 Code: 23557322 Description: 11042 - DEB SUBQ TISSUE 20 SQ CM/< ICD-10 Diagnosis Description L97.812 Non-pressure chronic ulcer of other part of right lower leg  with fat layer exp Modifier: osed Quantity: 1 Physician Procedures : CPT4 Code Description Modifier 11042 11042 - WC PHYS SUBQ TISS 20 SQ CM ICD-10 Diagnosis Description L97.812 Non-pressure chronic ulcer of other part of right lower leg with fat layer exposed Quantity: 1 Electronic Signature(s) Signed: 10/10/2023 9:09:48 AM By: Allen Derry PA-C Entered By: Allen Derry on 10/10/2023 09:09:48

## 2023-10-12 NOTE — Progress Notes (Signed)
MERI, WILHELMI R ([id number]) [id number] Page 1 of 9 Visit Report for 10/10/2023 Arrival Information Details Patient Name: Date of Service: [name], [name] 10/10/2023 8:30 A M Medical Record Number: [id number] Patient Account Number: [id number] Date of Birth/Sex: Treating RN: [birth year] (42 y.o. [name] Primary Care [name]: PA Zenovia Jordan, NO Other Clinician: Referring [name]: Treating [name]/Extender: [name], Referral Weeks in Treatment: 15 Visit Information History Since Last Visit Added or deleted any medications: No Patient Arrived: Ambulatory Any new allergies or adverse reactions: No Arrival Time: 08:44 Has Dressing in Place as Prescribed: Yes Accompanied By: [name] Has Compression in Place as Prescribed: Yes Transfer Assistance: None Pain Present Now: No Patient Identification Verified: Yes Secondary Verification Process Completed: Yes Patient Requires Transmission-Based Precautions: No Patient Has Alerts: No Electronic Signature(s) Signed: 10/12/2023 12:46:12 PM By: Jessica Aver MSN RN CNS WTA Entered By: Jessica Aver on 10/10/2023 08:46:55 -------------------------------------------------------------------------------- Clinic Level of Care Assessment Details Patient Name: Date of Service: [name] - [name] [place], [name] 10/10/2023 8:30 A M Medical Record Number: [id number] Patient Account Number: [id number] Date of Birth/Sex: Treating RN: December 20, 1980 (42 y.o. [name] Primary Care [name]: PA Zenovia Jordan, NO Other Clinician: Referring [name]: Treating [name]/Extender: [name], Referral Weeks in Treatment: 15 Clinic Level of Care Assessment Items TOOL 1 Quantity Score []  - 0 Use when EandM and Procedure is performed on INITIAL visit ASSESSMENTS - Nursing Assessment / Reassessment []  - 0 General Physical Exam (combine w/ comprehensive assessment (listed just below) when performed on new pt. evals) []  - 0 Comprehensive  Assessment (HX, ROS, Risk Assessments, Wounds Hx, etc.) ASSESSMENTS - Wound and Skin Assessment / Reassessment []  - 0 Dermatologic / Skin Assessment (not related to wound area) ASSESSMENTS - Ostomy and/or Continence Assessment and Care CHANDNI, DARIO R ([id number]) [id number] Page 2 of 9 []  - 0 Incontinence Assessment and Management []  - 0 Ostomy Care Assessment and Management (repouching, etc.) PROCESS - Coordination of Care []  - 0 Simple Patient / Family Education for ongoing care []  - 0 Complex (extensive) Patient / Family Education for ongoing care []  - 0 Staff obtains Chiropractor, Records, T Results / Process Orders est []  - 0 Staff telephones HHA, Nursing Homes / Clarify orders / etc []  - 0 Routine Transfer to another Facility (non-emergent condition) []  - 0 Routine [name] Admission (non-emergent condition) []  - 0 New Admissions / Manufacturing engineer / Ordering NPWT Apligraf, etc. , []  - 0 Emergency [name] Admission (emergent condition) PROCESS - Special Needs []  - 0 Pediatric / Minor Patient Management []  - 0 Isolation Patient Management []  - 0 Hearing / Language / Visual special needs []  - 0 Assessment of Community assistance (transportation, D/C planning, etc.) []  - 0 Additional assistance / Altered mentation []  - 0 Support Surface(s) Assessment (bed, cushion, seat, etc.) INTERVENTIONS - Miscellaneous []  - 0 External ear exam []  - 0 Patient Transfer (multiple staff / Nurse, adult / Similar devices) []  - 0 Simple Staple / Suture removal (25 or less) []  - 0 Complex Staple / Suture removal (26 or more) []  - 0 Hypo/Hyperglycemic Management (do not check if billed separately) []  - 0 Ankle / Brachial Index (ABI) - do not check if billed separately Has the patient been seen at the [name] within the last three years: Yes Total Score: 0 Level Of Care: ____ Electronic Signature(s) Signed: 10/12/2023 12:46:12 PM By: Jessica Aver  MSN RN CNS WTA Entered By: Jessica Aver on 10/10/2023 09:08:05 -------------------------------------------------------------------------------- Compression Therapy Details Patient Name: Date of  Service: [name], [name] 10/10/2023 8:30 A M Medical Record Number: [id number] Patient Account Number: [id number] Date of Birth/Sex: Treating RN: [birth year] (42 y.o. [name] Primary Care [name]: PA Zenovia Jordan, NO Other Clinician: Referring [name]: Treating [name]/Extender: [name], Referral Weeks in Treatment: 15 Compression Therapy Performed for Wound Assessment: Wound #7 Right,Lateral Lower Leg Performed By: Jessica Janus, RN Compression Type: Four Pleas [name] ([id number]) [id number] Page 3 of 9 Post Procedure Diagnosis Same as Pre-procedure Electronic Signature(s) Signed: 10/12/2023 12:46:12 PM By: Jessica Aver MSN RN CNS WTA Entered By: Jessica Aver on 10/10/2023 09:05:21 -------------------------------------------------------------------------------- Encounter Discharge Information Details Patient Name: Date of Service: [name], [name] 10/10/2023 8:30 A M Medical Record Number: [id number] Patient Account Number: [id number] Date of Birth/Sex: Treating RN: [birth year] (42 y.o. [name] Primary Care [name]: PA Zenovia Jordan, NO Other Clinician: Referring [name]: Treating [name]/Extender: [name], Referral Weeks in Treatment: 15 Encounter Discharge Information Items Post Procedure Vitals Discharge Condition: Stable Temperature (F): 97.8 Ambulatory Status: Ambulatory Pulse (bpm): 87 Discharge Destination: Home Respiratory Rate (breaths/min): 18 Transportation: Private Auto Blood Pressure (mmHg): 144/83 Accompanied By: [name] Schedule Follow-up Appointment: Yes Clinical Summary of Care: Electronic Signature(s) Signed: 10/12/2023 12:46:12 PM By: Jessica Aver MSN RN CNS WTA Entered By: Jessica Aver on  10/10/2023 09:19:22 -------------------------------------------------------------------------------- Lower Extremity Assessment Details Patient Name: Date of Service: [name], [name] 10/10/2023 8:30 A M Medical Record Number: [id number] Patient Account Number: [id number] Date of Birth/Sex: Treating RN: Sep 13, 1981 (42 y.o. [name] Primary Care Tellis Spivak: PA Zenovia Jordan, NO Other Clinician: Referring Buzz Axel: Treating Ariani Seier/Extender: [name], Referral Weeks in Treatment: 15 Edema Assessment Assessed: [Left: No] [Right: No] [Left: Edema] [Right: :] Calf Left: RightGUELDA, STANKUS R ([id number]) [id number] Page 4 of 9 Point of Measurement: 38 cm From Medial Instep 57 cm Ankle Left: Right: Point of Measurement: 13 cm From Medial Instep 34 cm Vascular Assessment Pulses: Dorsalis Pedis Palpable: [Right:Yes] Extremity colors, hair growth, and conditions: Extremity Color: [Right:Hyperpigmented] Hair Growth on Extremity: [Right:No] Temperature of Extremity: [Right:Warm] Capillary Refill: [Right:< 3 seconds] Dependent Rubor: [Right:No] Blanched when Elevated: [Right:No No] Toe Nail Assessment Left: Right: Thick: Yes Discolored: Yes Deformed: Yes Improper Length and Hygiene: Yes Electronic Signature(s) Signed: 10/12/2023 12:46:12 PM By: Jessica Aver MSN RN CNS WTA Entered By: Jessica Aver on 10/10/2023 08:59:57 -------------------------------------------------------------------------------- Multi Wound Chart Details Patient Name: Date of Service: [name], [name] 10/10/2023 8:30 A M Medical Record Number: [id number] Patient Account Number: [id number] Date of Birth/Sex: Treating RN: [birth year] (42 y.o. [name] Primary Care Velita Quirk: PA Zenovia Jordan, NO Other Clinician: Referring Florene Brill: Treating Garnet Chatmon/Extender: [name], Referral Weeks in Treatment: 15 Vital Signs Height(in): 73 Pulse(bpm): 87 Weight(lbs): 436 Blood  Pressure(mmHg): 144/83 Body Mass Index(BMI): 57.5 Temperature(F): 97.8 Respiratory Rate(breaths/min): 18 [7:Photos:] [N/A:N/A] Right, Lateral Lower Leg N/A N/A Wound Location: NADYIA, HANNEKEN R ([id number]) [id number] Page 5 of 9 Gradually Appeared N/A N/A Wounding Event: Venous Leg Ulcer N/A N/A Primary Etiology: Lymphedema, Hypertension N/A N/A Comorbid History: 06/06/2023 N/A N/A Date Acquired: 15 N/A N/A Weeks of Treatment: Open N/A N/A Wound Status: No N/A N/A Wound Recurrence: 2.5x2.3x0.2 N/A N/A Measurements L x W x D (cm) 4.516 N/A N/A A (cm) : rea 0.903 N/A N/A Volume (cm) : -30.70% N/A N/A % Reduction in A rea: 12.90% N/A N/A % Reduction in Volume: Full Thickness Without Exposed N/A N/A Classification: Support Structures Medium N/A N/A Exudate Amount: Serosanguineous N/A N/A Exudate Type: red, brown N/A  N/A Exudate Color: Small (1-33%) N/A N/A Granulation Amount: Red, Pink N/A N/A Granulation Quality: Medium (34-66%) [name]: Fat Layer (Subcutaneous Tissue): Yes N/A N/A Exposed Structures: Fascia: No Tendon: No Muscle: No Joint: No Bone: No None N/A N/A Epithelialization: Treatment Notes Electronic Signature(s) Signed: 10/12/2023 12:46:12 PM By: Jessica Aver MSN RN CNS WTA Entered By: Jessica Aver on 10/10/2023 09:03:00 -------------------------------------------------------------------------------- Multi-Disciplinary Care Plan Details Patient Name: Date of Service: [name], [name] 10/10/2023 8:30 A M Medical Record Number: [id number] Patient Account Number: [id number] Date of Birth/Sex: Treating RN: 05-Jul-1981 (42 y.o. [name] Primary Care Darian Cansler: PA Zenovia Jordan, NO Other Clinician: Referring Sharie Amorin: Treating Mckena Chern/Extender: [name], Referral Weeks in Treatment: 15 Active Inactive Wound/Skin Impairment Nursing Diagnoses: Impaired tissue integrity Knowledge deficit related to  ulceration/compromised skin integrity Goals: Patient/caregiver will verbalize understanding of skin care regimen Date Initiated: 06/27/2023 Date Inactivated: 08/01/2023 Target Resolution Date: 07/28/2023 Goal Status: Met Ulcer/skin breakdown will have a volume reduction of 30% by week 4 Date Initiated: 06/27/2023 Date Inactivated: 08/01/2023 Target Resolution Date: 07/28/2023 Goal Status: Met Ulcer/skin breakdown will have a volume reduction of 50% by week 8 Date Initiated: 06/27/2023 Date Inactivated: 08/22/2023 Target Resolution Date: 08/27/2023 Goal Status: CRAIG, WAHLER R ([id number]) [id number] Page 6 of 9 Ulcer/skin breakdown will have a volume reduction of 80% by week 12 Date Initiated: 06/27/2023 Target Resolution Date: 11/27/2023 Goal Status: Active Interventions: Assess patient/caregiver ability to obtain necessary supplies Assess patient/caregiver ability to perform ulcer/skin care regimen upon admission and as needed Assess ulceration(s) every visit Provide education on ulcer and skin care Treatment Activities: Skin care regimen initiated : 06/27/2023 Notes: Electronic Signature(s) Signed: 10/12/2023 12:46:12 PM By: Jessica Aver MSN RN CNS WTA Entered By: Jessica Aver on 10/10/2023 09:08:33 -------------------------------------------------------------------------------- Pain Assessment Details Patient Name: Date of Service: [name], [name] 10/10/2023 8:30 A M Medical Record Number: [id number] Patient Account Number: [id number] Date of Birth/Sex: Treating RN: 24-Feb-1981 (42 y.o. [name] Primary Care Jomaira Darr: PA Zenovia Jordan, NO Other Clinician: Referring Atira Borello: Treating Mauricio [name]/Extender: [name], Referral Weeks in Treatment: 15 Active Problems Location of Pain Severity and Description of Pain Patient Has Paino No Site Locations Pain Management and Medication Current Pain Management: Electronic Signature(s) Signed: 10/12/2023  12:46:12 PM By: Jessica Aver MSN RN CNS WTA Entered By: Jessica Aver on 10/10/2023 08:49:42 Neault, Cataleyah R ([id number]) [id number] Page 7 of 9 -------------------------------------------------------------------------------- Patient/Caregiver Education Details Patient Name: Date of Service: [name], CZEKAJ. 11/20/2024andnbsp8:30 A M Medical Record Number: [id number] Patient Account Number: [id number] Date of Birth/Gender: Treating RN: 1981-09-25 (42 y.o. [name] Primary Care Physician: PA Zenovia Jordan, NO Other Clinician: Referring Physician: Treating Physician/Extender: [name], Referral Weeks in Treatment: 15 Education Assessment Education Provided To: Patient Education Topics Provided Wound/Skin Impairment: Handouts: Caring for Your Ulcer Methods: Explain/Verbal Responses: State content correctly Electronic Signature(s) Signed: 10/12/2023 12:46:12 PM By: Jessica Aver MSN RN CNS WTA Entered By: Jessica Aver on 10/10/2023 09:08:45 -------------------------------------------------------------------------------- Wound Assessment Details Patient Name: Date of Service: [name], [name] 10/10/2023 8:30 A M Medical Record Number: [id number] Patient Account Number: [id number] Date of Birth/Sex: Treating RN: [birth year] (42 y.o. [name] Primary Care Tymere Depuy: PA Zenovia Jordan, NO Other Clinician: Referring Marvens Hollars: Treating Porfiria Heinrich/Extender: [name], Referral Weeks in Treatment: 15 Wound Status Wound Number: 7 Primary Etiology: Venous Leg Ulcer Wound Location: Right, Lateral Lower Leg Wound Status: Open Wounding Event: Gradually Appeared Comorbid History: Lymphedema, Hypertension Date Acquired: 06/06/2023 Weeks Of Treatment: 15  Clustered Wound: No Photos [name], Jessica R ([id number]) [id number] Page 8 of 9 Wound Measurements Length: (cm) 2.5 Width: (cm) 2.3 Depth: (cm) 0.2 Area: (cm) 4.516 Volume: (cm)  0.903 % Reduction in Area: -30.7% % Reduction in Volume: 12.9% Epithelialization: None Wound Description Classification: Full Thickness Without Exposed Support Structures Exudate Amount: Medium Exudate Type: Serosanguineous Exudate Color: red, brown Foul Odor After Cleansing: No Slough/Fibrino Yes Wound Bed Granulation Amount: Small (1-33%) Exposed Structure Granulation Quality: Red, Pink Fascia Exposed: No Necrotic Amount: Medium (34-66%) Fat Layer (Subcutaneous Tissue) Exposed: Yes Necrotic Quality: Adherent Slough Tendon Exposed: No Muscle Exposed: No Joint Exposed: No Bone Exposed: No Treatment Notes Wound #7 (Lower Leg) Wound Laterality: Right, Lateral Cleanser Soap and Water Discharge Instruction: Gently cleanse wound with antibacterial soap, rinse and pat dry prior to dressing wounds Vashe 5.8 (oz) Discharge Instruction: Use vashe 5.8 (oz) as directed Peri-Wound Care Topical Primary Dressing Hydrofera Blue Ready Transfer Foam, 2.5x2.5 (in/in) Discharge Instruction: Apply Hydrofera Blue Ready to wound bed as directed Secondary Dressing Zetuvit Plus 4x8 (in/in) Secured With Compression Wrap Urgo K2, two layer compression system, large Compression Stockings Add-Ons Electronic Signature(s) Signed: 10/12/2023 12:46:12 PM By: Jessica Aver MSN RN CNS WTA Entered By: Jessica Aver on 10/10/2023 08:55:24 [name], Nyoka R ([id number]) [id number] Page 9 of 9 -------------------------------------------------------------------------------- Vitals Details Patient Name: Date of Service: [name], [name] 10/10/2023 8:30 A M Medical Record Number: [id number] Patient Account Number: [id number] Date of Birth/Sex: Treating RN: 13-Jan-1981 (42 y.o. [name] Primary Care Yanilen Adamik: PA Zenovia Jordan, NO Other Clinician: Referring Rylynn Schoneman: Treating Ladarian Bonczek/Extender: [name], Referral Weeks in Treatment: 15 Vital Signs Time Taken:  08:47 Temperature (F): 97.8 Height (in): 73 Pulse (bpm): 87 Weight (lbs): 436 Respiratory Rate (breaths/min): 18 Body Mass Index (BMI): 57.5 Blood Pressure (mmHg): 144/83 Reference Range: 80 - 120 mg / dl Electronic Signature(s) Signed: 10/12/2023 12:46:12 PM By: Jessica Aver MSN RN CNS WTA Entered By: Jessica Aver on 10/10/2023 08:49:35

## 2023-10-17 ENCOUNTER — Encounter: Payer: Self-pay | Admitting: Physician Assistant

## 2023-10-17 DIAGNOSIS — I87311 Chronic venous hypertension (idiopathic) with ulcer of right lower extremity: Secondary | ICD-10-CM | POA: Diagnosis not present

## 2023-10-17 DIAGNOSIS — I872 Venous insufficiency (chronic) (peripheral): Secondary | ICD-10-CM | POA: Diagnosis not present

## 2023-10-17 DIAGNOSIS — L97812 Non-pressure chronic ulcer of other part of right lower leg with fat layer exposed: Secondary | ICD-10-CM | POA: Diagnosis not present

## 2023-10-17 DIAGNOSIS — L03115 Cellulitis of right lower limb: Secondary | ICD-10-CM | POA: Diagnosis not present

## 2023-10-17 DIAGNOSIS — I10 Essential (primary) hypertension: Secondary | ICD-10-CM | POA: Diagnosis not present

## 2023-10-17 DIAGNOSIS — I890 Lymphedema, not elsewhere classified: Secondary | ICD-10-CM | POA: Diagnosis not present

## 2023-10-17 NOTE — Progress Notes (Signed)
VERLA, BALINGIT [name] ([id number]) [id number] Page 1 of 10 Visit Report for 10/17/2023 Chief Complaint Document Details Patient Name: Date of Service: [name], [name] 10/17/2023 12:30 PM Medical Record Number: [id number] Patient Account Number: [id number] Date of Birth/Sex: Treating RN: Jul 28, 1981 (42 y.o. [name] Primary Care Provider: PA [name], [name] Other Clinician: Referring Provider: Treating Provider/Extender: [name] Self, Referral Weeks in Treatment: 16 Information Obtained from: Patient Chief Complaint 06/27/2023; right lower extremity wound Electronic Signature(s) Signed: 10/17/2023 12:57:59 PM By: Jessica Derry PA-C Entered By: [name] on 10/17/2023 12:57:59 -------------------------------------------------------------------------------- HPI Details Patient Name: Date of Service: [name], [name] 10/17/2023 12:30 PM Medical Record Number: [id number] Patient Account Number: [id number] Date of Birth/Sex: Treating RN: [birth year] (42 y.o. [name] Primary Care Provider: PA [name], NO Other Clinician: Referring Provider: Treating Provider/Extender: [name] Self, Referral Weeks in Treatment: 16 History of Present Illness HPI Description: 42 year old patient well known to our [name] wound care clinic where she has been seen since 2016 for bilateral lower extremity venous insufficiency disease with lymphedema and multiple ulcerations associated with morbid obesity. she had custom-made compression stockings and lymphedema pumps which were used in the past. most recently she was admitted to the hospital between October 11 and 09/02/2017 with sepsis, lower extremity wounds and lymphedema.she was initially treated in the outpatient with Keflex and Bactrim. she was initially treated in the hospital with vancomycin and Zosyn and changed over to Unasyn until her white count improved and her blood cultures were negative for 3 days. After her  inpatient management she was discharged home on Augmentin to end on 09/13/2017 with a 14 day course. she has had outpatient vascular duplex scans completed in November 2017 and her right ABI was 1.1 and the left ABI is 1.3. she had normal toe brachial indices bilaterally.she had three-vessel runoff in the right lower extremity and two-vessel runoff in the left lower extremity. On questioning the patient she does have custom made compression stockings and also has a lymphedema pump but has not been using it appropriately and has not been taking good care of herself. 09/17/2017 -- she returns today with compression stockings on the left side and the right side has had significant amount of drainage and has a very strong odor 09/24/2017 -- the drainage is increased significantly and she has more lymphedema and a very strong odor to her wound. Though she does not have systemic [name], [name] ([id number]) [phone].pdf Page 2 of 10 symptoms, or overt infection I believe she will benefit from some doxycycline given empirically. 10/01/2017 -- after starting the doxycycline and changing the dressing twice a week her symptoms and signs have definitely improved overall. 10/08/2017 -- she has completed her course of doxycycline but continues to have a lot of drainage and needs twice a week dressing changes. 11/08/17-she is here in follow-up evaluation for right lower extremity ulcers. She admits to using her lymphedema pumps twice daily, one hour per session. she is voicing no complaints or concerns, no signs of infection will change to [name] 12/14/17 on evaluation today patient appears to be doing very well in regard to her wounds. She has been tolerating the dressing changes she continues to develop some portly the adherent granular tissue on the surface of the wound with some Slough. Obviously we are trying to get too much better wound bed. With that being said the hyper granulation  the Hydrofera Blue Dressing to have helped with which is excellent news. However I think it  may be time to try something a little bit different at this point. 01/11/18 on evaluation today patient appears to be doing fairly well in regard to her right lateral lower extremity ulcers. This shows excellent signs of filling in which is great news. There does not appear to be any evidence of infection which is also good news. She does continue to work as well is good school. She is having no pain. 01/22/18 on evaluation today patient appears to be doing a little bit worse in my opinion in regard to the overall quality of that granulation on her right lower extremity. She was not here last week due to being sick with a stomach virus this may have something to do with the fact that her wound appears to be a little bit worse. With that being said I'm also thinking that after switching from the [name] Dressing to the silver collagen would really has not looked that's good in my opinion. We may want to swit 02/11/18 on evaluation today patient's right lower/lateral lower extremity ulcers appear to be doing very well at this point. Especially the more proximal ulcer has filled in much closer to surface which is good news. Nonetheless both show signs of improvement which is great news. There does not appear to be any evidence of infection which is also good news. In general patient has been doing well tolerating the wraps as well as the [name] 02/18/18 on evaluation today patient appears to be doing a little bit worse in regard to the periwound region the wounds themselves do not look much deteriorated to me. With that being said she has several small blisters/pustules noted in the periwound and there was a significant amount of drainage and maceration compared to previous. There has been a time that we had to bring her back for twice a week dressing changes as far as her wrap was concerned it  has been a while since we've done that however. With that being said the patient has been having some burning and in general I'm concerned about the possibility of infection. She has previously taken doxycycline with good result. Fortunately there does not appear to be any evidence of overall worsening in regard to the size of the wound and in fact the upper wound actually appears to be showing signs of good epithelialization. 03/18/18 on evaluation today patient appears to be doing excellent in regard to her right lateral lower extremity ulcer. She has been tolerating the dressing changes without complication. Fortunately this seems to be making great progress. Overall I see no signs of infection and there is dramatic improvement overall even compared to last week. 03/28/18 on evaluation today patient appears to be doing very well in regard to her right lateral lower surety ulcer. She has been tolerating the dressing changes without complication at this point. She states currently that she's having no significant discomfort which is excellent as well. Overall I'm pleased with how things seem to be progressing. 04/01/18 on evaluation today patient appears to be doing excellent in regard to her right lateral lower extremity ulcers. She has been tolerating the dressing changes without complication which is good news. With that being said the wraps still continues to show signs of helping with her fluid she does have Juxta-Lite compression stockings for when we are done with the current treatment regimen once everything heals. Mainly she just has the one area still remaining the smaller of the two wings is pretty much closed at  this point there's just a very slight opening noted. 04/08/18 on evaluation today patient appears to be doing better in regard to the original wound on the right lateral lower extremity that we have been managing. Unfortunately she has a new area of weeping more anterior on the right  lateral lower extremity and on the left lower extremity she has two new ulcers there appears to be some cellulitis noted at this time. I am concerned about the fact that this may in fact be an infection that has caused the worsening and swelling in the past this has been the case when we previously attempted to determine what was going on when she had down slides like this. With that being said the patient is seeming to tolerate the wraps fairly well for the most part. 04/17/18; since last time the patient was seen in this clinic she was hospitalized from 04/11/18 through 04/13/18; she presented with bilateral lower extremity pain worse on the right and a fever of up to 104. Noteworthy that when she was in the clinic last week she had new wounds on the left leg culture grew MRSA and she was prescribed Bactrim. She had 2 days of IV Vanco and Zosyn in the hospital. Her blood cultures were negative. She was discharged on Bactrim to cover the original MRSA on the right leg and Keflex to cover the possibility of strep. The hospitalist had a conversation with infectious disease. The patient arrives in clinic today for nurse check however given the recent hospitalization I was asked to look at her. The patient states she feels a lot better. No fever or chills. Still some pain in the right calf but a lot better. She arrived in the hospital with a white count of 15.6, the next day was 10.8. Comprehensive metabolic panel was normal. She is still taking Keflex and Bactrim It would appear that she had a surgical IandD at the bedside of the right calf felt to have a underlying abscess. According to our intake nurse the wound has expanded quite bit on the right lateral calf. She has no open area on the left anterior and left posterior calf as described last time 04/23/18 on evaluation today patient actually appears to be doing much better than when I last saw her. This obviously has been a couple weeks ago and in  the interim she was admitted to the hospital for IV antibiotic therapy due to cellulitis, discharge, and fortunately the substance which hadn't sued has completely resolved. Her swelling seems to be better in regard to her lower extremities as well and the wounds that opened up as results of the infection seems to be showing signs of improvement. There is some Slough noted on the left lateral wound otherwise a lot of weeping noted of the right lateral leg. 04/29/18 on evaluation today patient appears to be doing excellent in regard to her bilateral lower extremity ulcers. She's been tolerating the dressing changes without complication there does not appear to be any evidence of infection at this time. Overall I think she is made great improvement and seems to be showing signs of coming back around where she was prior to the cellulitis and sepsis episode. 05/06/18 on evaluation today patient appears to be doing better in regard to her bilateral ocean the ulcers. She's been tolerating the dressing changes without complication. Fortunately there does not appear to be any evidence of infection at this time. Her swelling is significantly down overall seems to be showing signs  of good improvement as well. Fortunately I do believe that she is progressing nicely back to where she was prior to the cellulitis/sepsis issue. 05/13/18 on evaluation today patient seems to be showing signs of great improvement she's been tolerating the dressing changes without complication and overall there does not appear to be any evidence of infection. All of which is good news. The only issue she really have today is that some of the [facility] Dressing actually was stuck to the periwound of the right lateral lower extremity however this was able to be removed during the debridement without complication. 05/21/18 on evaluation today patient appears to be doing very well in regard to her bilateral lower extremity ulcers. She has  been tolerating the compression wraps without complication. There does not appear to be any evidence of infection at this point which is good news as well. Overall I'm very pleased with the progress that has been made. She likewise is very happy. 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 05/28/18 on evaluation today patient actually appears to be doing excellent in regard to her bilateral lower [place] ulcers. She's made great progress since last week and overall I'm very pleased in this regard. She states that she's not have any discomfort either which is also good news there's definitely no evidence of infection. 06/03/18 on evaluation today patient actually appears to be doing excellent in regard to her ulcerations in fact these areas on both lower extremities were almost Rosenwald, Annleigh [name] ([id number]) [phone].pdf Page 3 of 10 completely healed. Unfortunately she has a situation going on her life right now she actually found her husband deceased last November 22, 2023. She states that she has been quite a bit upset since that time unfortunately this is obviously a very big blow to her. Nonetheless she is concerned about the funeral and having to wear certain shoes for this that she states she cannot fit in with the wraps. She wonders if she could potentially come back on 2023-11-22 to have her wraps changed and to switch into her Juxta-Lite compression at that point. I think that would be an okay thing that we could do for her. Nonetheless she fortunately seems to be tolerating the wraps very well and again has made excellent progress with the [name] Dressing 06/11/18 on evaluation today patient appears to be doing rather well in regard to her bilateral lower extremities in fact everything appears to be completely healed at this point which is excellent news. There does  not appear to be any evidence of infection at this time which is great. In fact overall I do believe she is completely resolved in regard to her bilateral lower extremity ulcers. She is extremely happy in this regard. 06/27/2023 Ms. Scarlotte Graessle is a 42 year old female with a past medical history of venous insufficiency and lymphedema that presents to the clinic for a 3-week history of nonhealing wound to the right lower extremity. She states this started spontaneously. She does not wear compression stockings. She has been keeping the area covered. She currently denies signs of infection. 8/14; patient readmitted to clinic last week. She has venous insufficiency and chronic secondary lymphedema. She has a nonhealing wound on the right lateral lower leg. She has been using gent mupirocin and Hydrofera Blue using an Urgo K2 lite wrap. Her ABI on the right leg was 0.74 on admission to clinic. Formal arterial studies  were ordered but we have not yet had any contact with the patient. Also there was some question about juxta lites for this patient we have not heard anything about that either. 8/28; patient presents for follow-up. She had ABIs completed on 8/23 that showed noncompressible ABIs on the right and left with TBI of 0.88 on the left and 0.96 on the right. She had triphasic waveforms throughout the arterial system. This is reassuring that she has adequate blood flow for healing. We have been using antibiotic ointment with Hydrofera Blue under Urgo K2 lite wrap. She has tolerated this well. Wound is smaller. 9/4; patient presents for follow-up. We have been using Urgo 2 regular4-layer equivalent as well as Hydrofera Blue and antibiotic ointment. Wound is slightly smaller. She has no issues or complaints. She tolerated the wrap well. 9/11; patient presents for follow-up. We have been using Hydrofera Blue with antibiotic ointment under Urgo regular compression. She states the wrap slid down on Sunday  and she has been keeping the area covered for the past 2 to 3 days. 9/18; small wound on the right lateral lower leg this is in the setting of significant lymphedema. We have you been using gent and mupirocin with Hydrofera Blue and Urgo K2 regular compression. The wound is about the same size this week. At 1 point she got wraparound stockings/external compression stockings during her time in our [place] sister clinic. I am not sure she is actually been wearing these in fact I think not 9/25; right lateral leg in the setting of very significant stage III lymphedema. We have been using gent and mupirocin under Hydrofera Blue and using an Urgo K2 lite. 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 She tells [place] a pipe burst in her apartment she got the wrap wet while cleaning up took it off 10/9; right lateral lower leg in the setting of stage III lymphedema. Small punched-out area but with a continuously nonviable surface. I changed to her to Iodoflex last week to help with the surface although she still comes in with a lot of fibrinous debris 10/16; right lateral lower leg wound in the setting of stage III lymphedema significant hemosiderin deposition but no obvious erythema. She arrives in clinic today with a much better looking wound surface. I started her on Iodoflex 2 weeks ago and did an aggressive debridement last week. The dimensions are slightly slightly larger today probably because of the debridement, however I think the surface of the wound is quite a bit better today. 10/23; comes in today with a large amount of sanguinous drainage. This is different for this patient. She  had no debridement last week. She says that there is some more pain than she is used to. She has not been systemically unwell. We have been using Iodoflex 10/30; patient had a wound infection last week which was new for her. Culture identified to have a growth of Citrobacter Koseri. It was pansensitive including to the doxycycline I gave her but still probably not the correct choice of antibiotic. She is still having drainage. The wound has improved but still does not look like what we had a few weeks ago. 09-26-2023  upon evaluation today patient appears to be doing well currently in regard to her wound. She has been tolerating the dressing changes without complication. Fortunately there does not appear to be any signs of infection and she is making excellent progress towards complete closure. With that being said there is some need for sharp debridement today and also believe that she probably needs to have a continuation of therapy with regard to her infection. I would actually put her on Levaquin for 2 weeks based on what I am seeing from a drainage standpoint. 10-03-2023 upon evaluation today patient appears to be doing well currently in regard to her wound. She has been tolerating the dressing changes without complication. Fortunately there does not appear to be any signs of infection at this time. 10-10-2023 upon evaluation today patient appears to be doing well currently in regard to her wound. She is actually tolerating the dressing changes without complication she is making excellent progress towards healing. Fortunately I am extremely pleased with where we stand today. 10-17-2023 upon evaluation today patient appears to be doing well currently in regard to her wound which is actually showing signs of excellent improvement. I do believe that she is making really good headway here and I am very pleased with where we stand I am hopeful that she will continue to show signs of good improvement  going forward. Electronic Signature(s) Signed: 10/17/2023 1:02:46 PM By: Jessica Derry PA-C Entered By: [name] on 10/17/2023 13:02:46 [name], [name] ([id number]) [id number] Page 4 of 10 -------------------------------------------------------------------------------- Physical Exam Details Patient Name: Date of ServiceClint [facility]s [name], [name] 10/17/2023 12:30 PM Medical Record Number: [id number] Patient Account Number: [id number] Date of Birth/Sex: Treating RN: 03-09-1981 (42 y.o. [name] Primary Care Provider: PA [name], NO Other Clinician: Referring Provider: Treating Provider/Extender: [name] Self, Referral Weeks in Treatment: 16 Constitutional Obese and well-hydrated in no acute distress. Respiratory normal breathing without difficulty. Psychiatric this patient is able to make decisions and demonstrates good insight into disease process. Alert and Oriented x 3. pleasant and cooperative. Notes Upon inspection patient's wound bed actually showed signs of good granulation epithelization at this point. Fortunately I do not see any signs of infection at this time which is excellent news and in general I do believe that we are making good headway here towards closure which is also excellent news. Electronic Signature(s) Signed: 10/17/2023 1:03:15 PM By: Jessica Derry PA-C Entered By: [name] on 10/17/2023 13:03:15 -------------------------------------------------------------------------------- Physician Orders Details Patient Name: Date of Service: [name], Rotunda [name]. 10/17/2023 12:30 PM Medical Record Number: [id number] Patient Account Number: [id number] Date of Birth/Sex: Treating RN: Jan 30, 1981 (42 y.o. [name] Primary Care Provider: PA TIENT, NO Other Clinician: Referring Provider: Treating Provider/Extender: [name] Self, Referral Weeks in Treatment: 16 The following information was scribed by: Midge Aver The information was  scribed for: [name] Verbal / Phone Orders: No Diagnosis Coding ICD-10 Coding Code Description [phone] Non-pressure chronic ulcer of other part of right lower leg with fat layer exposed I87.311 Chronic venous hypertension (idiopathic) with ulcer of right lower extremity I89.0 Lymphedema, not elsewhere classified L03.115 Cellulitis of right lower limb [name], [name] ([id number]) [id number] Page 5 of 10 Follow-up Appointments Return Appointment in 1 week. Nurse Visit as needed Bathing/ Shower/ Hygiene May shower with wound dressing protected with water repellent cover or cast protector. No tub bath. Anesthetic (Use 'Patient Medications' Section for Anesthetic Order Entry) Lidocaine applied to wound bed Edema Control - Orders / Instructions Optional:  One layer of unna paste to top of compression wrap (to act as an anchor). UrgoK2 - large Wound Treatment Wound #7 - Lower Leg Wound Laterality: Right, Lateral Cleanser: Soap and Water 1 x Per Week/30 Days Discharge Instructions: Gently cleanse wound with antibacterial soap, rinse and pat dry prior to dressing wounds Cleanser: Vashe 5.8 (oz) 1 x Per Week/30 Days Discharge Instructions: Use vashe 5.8 (oz) as directed Prim Dressing: Hydrofera Blue Ready Transfer Foam, 2.5x2.5 (in/in) 1 x Per Week/30 Days ary Discharge Instructions: Apply Hydrofera Blue Ready to wound bed as directed Secondary Dressing: Zetuvit Plus 4x8 (in/in) 1 x Per Week/30 Days Compression Wrap: Urgo K2, two layer compression system, large 1 x Per Week/30 Days Electronic Signature(s) Unsigned Entered By: Midge Aver on 10/17/2023 13:01:21 -------------------------------------------------------------------------------- Problem List Details Patient Name: Date of ServiceTRUMAN, [name] 10/17/2023 12:30 PM Medical Record Number: [id number] Patient Account Number: [id number] Date of Birth/Sex: Treating RN: 1981/05/28 (42 y.o. [name] Primary Care Provider: PA TIENT, NO Other Clinician: Referring Provider: Treating Provider/Extender: [name] Self, Referral Weeks in Treatment: 16 Active Problems ICD-10 Encounter Code Description Active Date MDM Diagnosis L97.812 Non-pressure chronic ulcer of other part of right lower leg with fat layer 06/27/2023 No Yes exposed I87.311 Chronic venous hypertension (idiopathic) with ulcer of right lower extremity 06/27/2023 No Yes I89.0 Lymphedema, not elsewhere classified 06/27/2023 No Yes [name], [name] ([id number]) [id number] Page 6 of 10 L03.115 Cellulitis of right lower limb 09/12/2023 No Yes Inactive Problems Resolved Problems Electronic Signature(s) Signed: 10/17/2023 12:57:56 PM By: Jessica Derry PA-C Entered By: [name] on 10/17/2023 12:57:56 -------------------------------------------------------------------------------- Progress Note Details Patient Name: Date of Service: [name], Madgeline [name]. 10/17/2023 12:30 PM Medical Record Number: [id number] Patient Account Number: [id number] Date of Birth/Sex: Treating RN: January 07, 1981 (42 y.o. [name] Primary Care Provider: PA TIENT, NO Other Clinician: Referring Provider: Treating Provider/Extender: [name] Self, Referral Weeks in Treatment: 16 Subjective Chief Complaint Information obtained from Patient 06/27/2023; right lower extremity wound History of Present Illness (HPI) 42 year old patient well known to our [facility] wound care clinic where she has been seen since 2016 for bilateral lower extremity venous insufficiency disease with lymphedema and multiple ulcerations associated with morbid obesity. she had custom-made compression stockings and lymphedema pumps which were used in the past. most recently she was admitted to the hospital between October 11 and 09/02/2017 with sepsis, lower extremity wounds and lymphedema.she was initially treated in the outpatient with Keflex  and Bactrim. she was initially treated in the hospital with vancomycin and Zosyn and changed over to Unasyn until her white count improved and her blood cultures were negative for 3 days. After her inpatient management she was discharged home on Augmentin to end on 09/13/2017 with a 14 day course. she has had outpatient vascular duplex scans completed in November 2017 and her right ABI was 1.1 and the left ABI is 1.3. she had normal toe brachial indices bilaterally.she had three-vessel runoff in the right lower extremity and two-vessel runoff in the left lower extremity. On questioning the patient she does have custom made compression stockings and also has a lymphedema pump but has not been using it appropriately and has not been taking good care of herself. 09/17/2017 -- she returns today with compression stockings on the left side and the right side has had significant amount of drainage and has a very strong odor 09/24/2017 -- the drainage is increased significantly and she has more lymphedema and a very strong  odor to her wound. Though she does not have systemic symptoms, or overt infection I believe she will benefit from some doxycycline given empirically. 10/01/2017 -- after starting the doxycycline and changing the dressing twice a week her symptoms and signs have definitely improved overall. 10/08/2017 -- she has completed her course of doxycycline but continues to have a lot of drainage and needs twice a week dressing changes. 11/08/17-she is here in follow-up evaluation for right lower extremity ulcers. She admits to using her lymphedema pumps twice daily, one hour per session. she is voicing no complaints or concerns, no signs of infection will change to [name] 12/14/17 on evaluation today patient appears to be doing very well in regard to her wounds. She has been tolerating the dressing changes she continues to develop some portly the adherent granular tissue on the surface of the wound  with some Slough. Obviously we are trying to get too much better wound bed. With that being said the hyper granulation the Hydrofera Blue Dressing to have helped with which is excellent news. However I think it may be time to try something a little bit different at this point. 01/11/18 on evaluation today patient appears to be doing fairly well in regard to her right lateral lower extremity ulcers. This shows excellent signs of filling in which is great news. There does not appear to be any evidence of infection which is also good news. She does continue to work as well is good school. She is having no pain. 01/22/18 on evaluation today patient appears to be doing a little bit worse in my opinion in regard to the overall quality of that granulation on her right lower [name], [name] ([id number]) [phone].pdf Page 7 of 10 extremity. She was not here last week due to being sick with a stomach virus this may have something to do with the fact that her wound appears to be a little bit worse. With that being said I'm also thinking that after switching from the [facility] Dressing to the silver collagen would really has not looked that's good in my opinion. We may want to swit 02/11/18 on evaluation today patient's right lower/lateral lower extremity ulcers appear to be doing very well at this point. Especially the more proximal ulcer has filled in much closer to surface which is good news. Nonetheless both show signs of improvement which is great news. There does not appear to be any evidence of infection which is also good news. In general patient has been doing well tolerating the wraps as well as the [name] 02/18/18 on evaluation today patient appears to be doing a little bit worse in regard to the periwound region the wounds themselves do not look much deteriorated to me. With that being said she has several small blisters/pustules noted in the periwound and  there was a significant amount of drainage and maceration compared to previous. There has been a time that we had to bring her back for twice a week dressing changes as far as her wrap was concerned it has been a while since we've done that however. With that being said the patient has been having some burning and in general I'm concerned about the possibility of infection. She has previously taken doxycycline with good result. Fortunately there does not appear to be any evidence of overall worsening in regard to the size of the wound and in fact the upper wound actually appears to be showing signs of good epithelialization. 03/18/18 on evaluation  today patient appears to be doing excellent in regard to her right lateral lower extremity ulcer. She has been tolerating the dressing changes without complication. Fortunately this seems to be making great progress. Overall I see no signs of infection and there is dramatic improvement overall even compared to last week. 03/28/18 on evaluation today patient appears to be doing very well in regard to her right lateral lower surety ulcer. She has been tolerating the dressing changes without complication at this point. She states currently that she's having no significant discomfort which is excellent as well. Overall I'm pleased with how things seem to be progressing. 04/01/18 on evaluation today patient appears to be doing excellent in regard to her right lateral lower extremity ulcers. She has been tolerating the dressing changes without complication which is good news. With that being said the wraps still continues to show signs of helping with her fluid she does have Juxta-Lite compression stockings for when we are done with the current treatment regimen once everything heals. Mainly she just has the one area still remaining the smaller of the two wings is pretty much closed at this point there's just a very slight opening noted. 04/08/18 on evaluation today  patient appears to be doing better in regard to the original wound on the right lateral lower extremity that we have been managing. Unfortunately she has a new area of weeping more anterior on the right lateral lower extremity and on the left lower extremity she has two new ulcers there appears to be some cellulitis noted at this time. I am concerned about the fact that this may in fact be an infection that has caused the worsening and swelling in the past this has been the case when we previously attempted to determine what was going on when she had down slides like this. With that being said the patient is seeming to tolerate the wraps fairly well for the most part. 04/17/18; since last time the patient was seen in this clinic she was hospitalized from 04/11/18 through 04/13/18; she presented with bilateral lower extremity pain worse on the right and a fever of up to 104. Noteworthy that when she was in the clinic last week she had new wounds on the left leg culture grew MRSA and she was prescribed Bactrim. She had 2 days of IV Vanco and Zosyn in the hospital. Her blood cultures were negative. She was discharged on Bactrim to cover the original MRSA on the right leg and Keflex to cover the possibility of strep. The hospitalist had a conversation with infectious disease. The patient arrives in clinic today for nurse check however given the recent hospitalization I was asked to look at her. The patient states she feels a lot better. No fever or chills. Still some pain in the right calf but a lot better. She arrived in the hospital with a white count of 15.6, the next day was 10.8. Comprehensive metabolic panel was normal. She is still taking Keflex and Bactrim It would appear that she had a surgical IandD at the bedside of the right calf felt to have a underlying abscess. According to our intake nurse the wound has expanded quite bit on the right lateral calf. She has no open area on the left anterior and  left posterior calf as described last time 04/23/18 on evaluation today patient actually appears to be doing much better than when I last saw her. This obviously has been a couple weeks ago and in the interim she  was admitted to the hospital for IV antibiotic therapy due to cellulitis, discharge, and fortunately the substance which hadn't sued has completely resolved. Her swelling seems to be better in regard to her lower extremities as well and the wounds that opened up as results of the infection seems to be showing signs of improvement. There is some Slough noted on the left lateral wound otherwise a lot of weeping noted of the right lateral leg. 04/29/18 on evaluation today patient appears to be doing excellent in regard to her bilateral lower extremity ulcers. She's been tolerating the dressing changes without complication there does not appear to be any evidence of infection at this time. Overall I think she is made great improvement and seems to be showing signs of coming back around where she was prior to the cellulitis and sepsis episode. 05/06/18 on evaluation today patient appears to be doing better in regard to her bilateral ocean the ulcers. She's been tolerating the dressing changes without complication. Fortunately there does not appear to be any evidence of infection at this time. Her swelling is significantly down overall seems to be showing signs of good improvement as well. Fortunately I do believe that she is progressing nicely back to where she was prior to the cellulitis/sepsis issue. 05/13/18 on evaluation today patient seems to be showing signs of great improvement she's been tolerating the dressing changes without complication and overall there does not appear to be any evidence of infection. All of which is good news. The only issue she really have today is that some of the [employer] [name] Dressing actually was stuck to the periwound of the right lateral lower extremity however this  was able to be removed during the debridement without complication. 05/21/18 on evaluation today patient appears to be doing very well in regard to her bilateral lower extremity ulcers. She has been tolerating the compression wraps without complication. There does not appear to be any evidence of infection at this point which is good news as well. Overall I'm very pleased with the progress that has been made. She likewise is very happy. 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 05/28/18 on evaluation today patient actually appears to be doing excellent in regard to her bilateral lower [place] ulcers. She's made great progress since last week and overall I'm very pleased in this regard. She states that she's not have any discomfort either which is also good news there's definitely no evidence of infection. 06/03/18 on evaluation today patient actually appears to be doing excellent in regard to her ulcerations in fact these areas on both lower extremities were almost completely healed. Unfortunately she has a situation going on her life right now she actually found her husband deceased last 11-17-2023. She states that she has been quite a bit upset since that time unfortunately this is obviously a very big blow to her. Nonetheless she is concerned about the funeral and having to wear certain shoes for this that she states she cannot fit in with the wraps. She wonders if she could potentially come back on 17-Nov-2023 to have her wraps changed and to switch into her Juxta-Lite compression at that point. I think that would be an okay thing that we could do for her. Nonetheless she fortunately seems to be tolerating the wraps very well and again has made excellent progress with the Hydrofera Blue Dressing 06/11/18 on evaluation today patient  appears to be doing rather well in regard to her bilateral lower  extremities in fact everything appears to be completely healed at this point which is excellent news. There does not appear to be any evidence of infection at this time which is great. In fact overall I do believe she is completely resolved in regard to her bilateral lower extremity ulcers. She is extremely happy in this regard. 06/27/2023 [name] is a 42 year old female with a past medical history of venous insufficiency and lymphedema that presents to the clinic for a 3-week history of nonhealing wound to the right lower extremity. She states this started spontaneously. She does not wear compression stockings. She has been keeping the area covered. She currently denies signs of infection. 8/14; patient readmitted to clinic last week. She has venous insufficiency and chronic secondary lymphedema. She has a nonhealing wound on the right lateral lower leg. She has been using gent mupirocin and Hydrofera Blue using an Urgo K2 lite wrap. [name], [name] ([id number]) [id number] Page 8 of 10 Her ABI on the right leg was 0.74 on admission to clinic. Formal arterial studies were ordered but we have not yet had any contact with the patient. Also there was some question about juxta lites for this patient we have not heard anything about that either. 8/28; patient presents for follow-up. She had ABIs completed on 8/23 that showed noncompressible ABIs on the right and left with TBI of 0.88 on the left and 0.96 on the right. She had triphasic waveforms throughout the arterial system. This is reassuring that she has adequate blood flow for healing. We have been using antibiotic ointment with Hydrofera Blue under Urgo K2 lite wrap. She has tolerated this well. Wound is smaller. 9/4; patient presents for follow-up. We have been using Urgo 2 regular4-layer equivalent as well as Hydrofera Blue and antibiotic ointment. Wound is slightly smaller. She has no issues or complaints. She  tolerated the wrap well. 9/11; patient presents for follow-up. We have been using Hydrofera Blue with antibiotic ointment under Urgo regular compression. She states the wrap slid down on Sunday and she has been keeping the area covered for the past 2 to 3 days. 9/18; small wound on the right lateral lower leg this is in the setting of significant lymphedema. We have you been using gent and mupirocin with Hydrofera Blue and Urgo K2 regular compression. The wound is about the same size this week. At 1 point she got wraparound stockings/external compression stockings during her time in our [facility]. I am not sure she is actually been wearing these in fact I think not 9/25; right lateral leg in the setting of very significant stage III lymphedema. We have been using gent and mupirocin under Hydrofera Blue and using an Urgo K2 lite. 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 She tells [place] a pipe burst in her apartment she got the wrap wet while cleaning up took it off 10/9; right lateral lower leg in the setting of stage III lymphedema. Small punched-out area but with a continuously nonviable surface. I changed to her to Iodoflex last week to help with the surface although she  still comes in with a lot of fibrinous debris 10/16; right lateral lower leg wound in the setting of stage III lymphedema significant hemosiderin deposition but no obvious erythema. She arrives in clinic today with a much better looking wound surface. I started her on Iodoflex 2 weeks ago and did an aggressive debridement last week. The dimensions are slightly slightly larger today probably because  of the debridement, however I think the surface of the wound is quite a bit better today. 10/23; comes in today with a large amount of sanguinous drainage. This is different for this patient. She had no debridement last week. She says that there is some more pain than she is used to. She has not been systemically unwell. We have been using Iodoflex 10/30; patient had a wound infection last week which was new for her. Culture identified to have a growth of Citrobacter Koseri. It was pansensitive including to the doxycycline I gave her but still probably not the correct choice of antibiotic. She is still having drainage. The wound has improved but still does not look like what we had a few weeks ago. 09-26-2023 upon evaluation today patient appears to be doing well currently in regard to her wound. She has been tolerating the dressing changes without complication. Fortunately there does not appear to be any signs of infection and she is making excellent progress towards complete closure. With that being said there is some need for sharp debridement today and also believe that she probably needs to have a continuation of therapy with regard to her infection. I would actually put her on Levaquin for 2 weeks based on what I am seeing from a drainage standpoint. 10-03-2023 upon evaluation today patient appears to be doing well currently in regard to her wound. She has been tolerating the dressing changes without complication. Fortunately there does not appear to be any signs of infection at this time. 10-10-2023 upon evaluation today patient appears to be doing well currently in regard to her wound. She is actually tolerating the dressing changes without complication she is making excellent progress towards healing. Fortunately I am extremely pleased with where we stand today. 10-17-2023 upon evaluation today patient appears to be doing well currently in regard to her wound which is actually showing signs of  excellent improvement. I do believe that she is making really good headway here and I am very pleased with where we stand I am hopeful that she will continue to show signs of good improvement going forward. Objective Constitutional Obese and well-hydrated in no acute distress. Vitals Time Taken: 12:40 PM, Height: 73 in, Weight: 436 lbs, BMI: 57.5, Temperature: 97.8 F, Pulse: 91 bpm, Respiratory Rate: 18 breaths/min, Blood Pressure: 134/72 mmHg. Respiratory normal breathing without difficulty. Psychiatric this patient is able to make decisions and demonstrates good insight into disease process. Alert and Oriented x 3. pleasant and cooperative. General Notes: Upon inspection patient's wound bed actually showed signs of good granulation epithelization at this point. Fortunately I do not see any signs of infection at this time which is excellent news and in general I do believe that we are making good headway here towards closure which is also excellent news. Integumentary (Hair, Skin) Wound #7 status is Open. Original cause of wound was Gradually Appeared. The date acquired was: 06/06/2023. The wound has been in treatment 16 weeks. The wound is located on the Right,Lateral Lower Leg. The wound measures 1.7cm length x 1.3cm width x 0.2cm depth; 1.736cm^2 area and 0.347cm^3 volume. There  is Fat Layer (Subcutaneous Tissue) exposed. There is a medium amount of serosanguineous drainage noted. There is small (1-33%) red, pink granulation within the wound bed. There is a medium (34-66%) amount of necrotic tissue within the wound bed including Adherent Slough. [name], [name] ([id number]) [id number] Page 9 of 10 Assessment Active Problems ICD-10 Non-pressure chronic ulcer of other part of right lower leg with fat layer exposed Chronic venous hypertension (idiopathic) with ulcer of right lower extremity Lymphedema, not elsewhere classified Cellulitis of right lower  limb Procedures Wound #7 Pre-procedure diagnosis of Wound #7 is a Venous Leg Ulcer located on the Right,Lateral Lower Leg . There was a Four Layer Compression Therapy Procedure by Midge Aver, RN. Post procedure Diagnosis Wound #7: Same as Pre-Procedure Plan Follow-up Appointments: Return Appointment in 1 week. Nurse Visit as needed Bathing/ Shower/ Hygiene: May shower with wound dressing protected with water repellent cover or cast protector. No tub bath. Anesthetic (Use 'Patient Medications' Section for Anesthetic Order Entry): Lidocaine applied to wound bed Edema Control - Orders / Instructions: Optional: One layer of unna paste to top of compression wrap (to act as an anchor). UrgoK2 - large WOUND #7: - Lower Leg Wound Laterality: Right, Lateral Cleanser: Soap and Water 1 x Per Week/30 Days Discharge Instructions: Gently cleanse wound with antibacterial soap, rinse and pat dry prior to dressing wounds Cleanser: Vashe 5.8 (oz) 1 x Per Week/30 Days Discharge Instructions: Use vashe 5.8 (oz) as directed Prim Dressing: Hydrofera Blue Ready Transfer Foam, 2.5x2.5 (in/in) 1 x Per Week/30 Days ary Discharge Instructions: Apply Hydrofera Blue Ready to wound bed as directed Secondary Dressing: Zetuvit Plus 4x8 (in/in) 1 x Per Week/30 Days Com pression Wrap: Urgo K2, two layer compression system, large 1 x Per Week/30 Days 1. I would recommend that we have the patient going continue to monitor for any evidence of infection or worsening. I do believe that we are making excellent headway towards closure and I am very pleased with where things stand I did not even have to perform any sharp debridement today which is great news. 2. I am also can recommend the patient should continue with the [facility] followed by the Zetuvit. 3. I am also going to recommend we continue with Urgo K2 compression wrap. We will see patient back for reevaluation in 1 week here in the clinic. If anything  worsens or changes patient will contact our office for additional recommendations. Electronic Signature(s) Signed: 10/17/2023 1:03:41 PM By: Jessica Derry PA-C Entered By: [name] on 10/17/2023 13:03:41 -------------------------------------------------------------------------------- SuperBill Details Patient Name: Date of Service: [name], Haniah [name]. 10/17/2023 Medical Record Number: [id number] Patient Account Number: [id number] OKEMA, DESJARDIN ([id number]) [phone].pdf Page 10 of 10 Date of Birth/Sex: Treating RN: Jun 30, 1981 (42 y.o. [name] Primary Care Provider: PA [name], NO Other Clinician: Referring Provider: Treating Provider/Extender: [name] Self, Referral Weeks in Treatment: 16 Diagnosis Coding ICD-10 Codes Code Description [phone] Non-pressure chronic ulcer of other part of right lower leg with fat layer exposed I87.311 Chronic venous hypertension (idiopathic) with ulcer of right lower extremity I89.0 Lymphedema, not elsewhere classified L03.115 Cellulitis of right lower limb Facility Procedures : CPT4 Code: [id number] Description: (Facility Use Only) 06237SE - APPLY MULTLAY COMPRS LWR RT LEG Modifier: Quantity: 1 Physician Procedures : CPT4 Code Description Modifier [id number] - WC PHYS LEVEL 3 - EST PT ICD-10 Diagnosis Description L97.812 Non-pressure chronic ulcer of other part of right lower leg with fat layer exposed 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

## 2023-10-18 NOTE — Progress Notes (Signed)
Jessica, PASEK [name] ([id number]) [id number] Page 1 of 10 Visit Report for 10/17/2023 Arrival Information Details Patient Name: Date of Service: [name], [name] 10/17/2023 12:30 PM Medical Record Number: [id number] Patient Account Number: [id number] Date of Birth/Sex: Treating RN: Apr 04, 1981 (42 y.o. [name] Primary Care Jailyn Leeson: PA Zenovia Jordan, NO Other Clinician: Referring Athelene Hursey: Treating Ordell Prichett/Extender: Allen Derry Self, Referral Weeks in Treatment: 16 Visit Information History Since Last Visit Added or deleted any medications: No Patient Arrived: Ambulatory Any new allergies or adverse reactions: No Arrival Time: 12:37 Had a fall or experienced change in No Accompanied By: self activities of daily living that may affect Transfer Assistance: None risk of falls: Patient Identification Verified: Yes Hospitalized since last visit: No Secondary Verification Process Completed: Yes Has Dressing in Place as Prescribed: Yes Patient Requires Transmission-Based Precautions: No Has Compression in Place as Prescribed: Yes Patient Has Alerts: No Pain Present Now: No Electronic Signature(s) Signed: 10/17/2023 4:53:00 PM By: Midge Aver MSN RN CNS WTA Entered By: Midge Aver on 10/17/2023 12:37:56 -------------------------------------------------------------------------------- Clinic Level of Care Assessment Details Patient Name: Date of Service: [facility], [name] 10/17/2023 12:30 PM Medical Record Number: [id number] Patient Account Number: [id number] Date of Birth/Sex: Treating RN: 15-Dec-1980 (42 y.o. [name] Primary Care Cyrilla Durkin: PA Zenovia Jordan, NO Other Clinician: Referring Christien Frankl: Treating Tynesha Free/Extender: Allen Derry Self, Referral Weeks in Treatment: 16 Clinic Level of Care Assessment Items TOOL 4 Quantity Score []  - 0 Use when only an EandM is performed on FOLLOW-UP visit ASSESSMENTS - Nursing Assessment / Reassessment []  -  0 Reassessment of Co-morbidities (includes updates in patient status) []  - 0 Reassessment of Adherence to Treatment Plan ASSESSMENTS - Wound and Skin A ssessment / Reassessment []  - 0 Simple Wound Assessment / Reassessment - one wound [place], [name] ([id number]) [id number] Page 2 of 10 []  - 0 Complex Wound Assessment / Reassessment - multiple wounds []  - 0 Dermatologic / Skin Assessment (not related to wound area) ASSESSMENTS - Focused Assessment []  - 0 Circumferential Edema Measurements - multi extremities []  - 0 Nutritional Assessment / Counseling / Intervention []  - 0 Lower Extremity Assessment (monofilament, tuning fork, pulses) []  - 0 Peripheral Arterial Disease Assessment (using hand held doppler) ASSESSMENTS - Ostomy and/or Continence Assessment and Care []  - 0 Incontinence Assessment and Management []  - 0 Ostomy Care Assessment and Management (repouching, etc.) PROCESS - Coordination of Care []  - 0 Simple Patient / Family Education for ongoing care []  - 0 Complex (extensive) Patient / Family Education for ongoing care []  - 0 Staff obtains Chiropractor, Records, T Results / Process Orders est []  - 0 Staff telephones HHA, Nursing Homes / Clarify orders / etc []  - 0 Routine Transfer to another Facility (non-emergent condition) []  - 0 Routine Hospital Admission (non-emergent condition) []  - 0 New Admissions / Manufacturing engineer / Ordering NPWT Apligraf, etc. , []  - 0 Emergency Hospital Admission (emergent condition) []  - 0 Simple Discharge Coordination []  - 0 Complex (extensive) Discharge Coordination PROCESS - Special Needs []  - 0 Pediatric / Minor Patient Management []  - 0 Isolation Patient Management []  - 0 Hearing / Language / Visual special needs []  - 0 Assessment of Community assistance (transportation, D/C planning, etc.) []  - 0 Additional assistance / Altered mentation []  - 0 Support Surface(s) Assessment (bed,  cushion, seat, etc.) INTERVENTIONS - Wound Cleansing / Measurement []  - 0 Simple Wound Cleansing - one wound []  - 0 Complex Wound Cleansing - multiple wounds []  - 0 Wound Imaging (photographs -  any number of wounds) []  - 0 Wound Tracing (instead of photographs) []  - 0 Simple Wound Measurement - one wound []  - 0 Complex Wound Measurement - multiple wounds INTERVENTIONS - Wound Dressings []  - 0 Small Wound Dressing one or multiple wounds []  - 0 Medium Wound Dressing one or multiple wounds []  - 0 Large Wound Dressing one or multiple wounds []  - 0 Application of Medications - topical []  - 0 Application of Medications - injection INTERVENTIONS - Miscellaneous []  - 0 External ear exam []  - 0 Specimen Collection (cultures, biopsies, blood, body fluids, etc.) []  - 0 Specimen(s) / Culture(s) sent or taken to Lab for analysis TESLYN, DELAHOZ [name] ([id number]) [phone].pdf Page 3 of 10 []  - 0 Patient Transfer (multiple staff / Nurse, adult / Similar devices) []  - 0 Simple Staple / Suture removal (25 or less) []  - 0 Complex Staple / Suture removal (26 or more) []  - 0 Hypo / Hyperglycemic Management (close monitor of Blood Glucose) []  - 0 Ankle / Brachial Index (ABI) - do not check if billed separately []  - 0 Vital Signs Has the patient been seen at the hospital within the last three years: Yes Total Score: 0 Level Of Care: ____ Electronic Signature(s) Signed: 10/17/2023 4:53:00 PM By: Midge Aver MSN RN CNS WTA Entered By: Midge Aver on 10/17/2023 13:01:43 -------------------------------------------------------------------------------- Compression Therapy Details Patient Name: Date of Service: [name], [name] 10/17/2023 12:30 PM Medical Record Number: [id number] Patient Account Number: [id number] Date of Birth/Sex: Treating RN: 05-24-1981 (42 y.o. [name] Primary Care Anastasha Ortez: PA Zenovia Jordan, NO Other Clinician: Referring Bryton Waight: Treating  Nathalie Cavendish/Extender: Allen Derry Self, Referral Weeks in Treatment: 16 Compression Therapy Performed for Wound Assessment: Wound #7 Right,Lateral Lower Leg Performed By: Clinician Midge Aver, RN Compression Type: Four Layer Post Procedure Diagnosis Same as Pre-procedure Electronic Signature(s) Signed: 10/17/2023 4:53:00 PM By: Midge Aver MSN RN CNS WTA Entered By: Midge Aver on 10/17/2023 13:00:40 -------------------------------------------------------------------------------- Encounter Discharge Information Details Patient Name: Date of Service: [facility], Albertia [name]. 10/17/2023 12:30 PM Medical Record Number: [id number] Patient Account Number: [id number] Date of Birth/Sex: Treating RN: July 13, 1981 (42 y.o. [name] Primary Care Diavian Furgason: PA Zenovia Jordan, [place] Other Clinician: Referring Shamia Uppal: Treating Breindel Collier/Extender: Allen Derry Self, Referral Weeks in Treatment: [address] Damariz [name] ([id number]) [id number] Page 4 of 10 Encounter Discharge Information Items Discharge Condition: Stable Ambulatory Status: Ambulatory Discharge Destination: Home Transportation: Private Auto Accompanied By: sister Schedule Follow-up Appointment: Yes Clinical Summary of Care: Electronic Signature(s) Signed: 10/17/2023 4:53:00 PM By: Midge Aver MSN RN CNS WTA Entered By: Midge Aver on 10/17/2023 13:03:53 -------------------------------------------------------------------------------- Lower Extremity Assessment Details Patient Name: Date of Service: [facility], Zaide [name]. 10/17/2023 12:30 PM Medical Record Number: [id number] Patient Account Number: [id number] Date of Birth/Sex: Treating RN: Aug 18, 1981 (42 y.o. [name] Primary Care Stacee Earp: PA Zenovia Jordan, NO Other Clinician: Referring Geni Skorupski: Treating Debanhi Blaker/Extender: Allen Derry Self, Referral Weeks in Treatment: 16 Edema Assessment Assessed: [Left: No] [Right: No] [Left: Edema] [Right: :] Calf Left:  Right: Point of Measurement: 38 cm From Medial Instep 57 cm Ankle Left: Right: Point of Measurement: 13 cm From Medial Instep 34 cm Vascular Assessment Pulses: Dorsalis Pedis Palpable: [Right:Yes] Extremity colors, hair growth, and conditions: Extremity Color: [Right:Hyperpigmented] Hair Growth on Extremity: [Right:No] Temperature of Extremity: [Right:Warm] Capillary Refill: [Right:< 3 seconds] Dependent Rubor: [Right:No] Blanched when Elevated: [Right:No No] Toe Nail Assessment Left: Right: Thick: Yes Discolored: Yes Deformed: Yes Improper Length and Hygiene: Yes Electronic Signature(s) Signed: 10/17/2023 4:53:00 PM By:  Midge Aver MSN RN CNS Darene Lamer, Nicola [name] 10/17/2023 4:53:00 PM By: Midge Aver MSN RN CNS WTA Signed: ([id number]) [id number] Page 5 of 10 Entered By: Midge Aver on 10/17/2023 12:50:50 -------------------------------------------------------------------------------- Multi Wound Chart Details Patient Name: Date of Service: SHARRA, ZERANGUE. 10/17/2023 12:30 PM Medical Record Number: [id number] Patient Account Number: [id number] Date of Birth/Sex: Treating RN: 1981-08-06 (42 y.o. [name] Primary Care Wania Longstreth: PA Zenovia Jordan, NO Other Clinician: Referring Salome Cozby: Treating Jasiya Markie/Extender: Allen Derry Self, Referral Weeks in Treatment: 16 Vital Signs Height(in): 73 Pulse(bpm): 91 Weight(lbs): 436 Blood Pressure(mmHg): 134/72 Body Mass Index(BMI): 57.5 Temperature(F): 97.8 Respiratory Rate(breaths/min): 18 [7:Photos:] [N/A:N/A] Right, Lateral Lower Leg N/A N/A Wound Location: Gradually Appeared N/A N/A Wounding Event: Venous Leg Ulcer N/A N/A Primary Etiology: Lymphedema, Hypertension N/A N/A Comorbid History: 06/06/2023 N/A N/A Date Acquired: 16 N/A N/A Weeks of Treatment: Open N/A N/A Wound Status: No N/A N/A Wound Recurrence: 1.7x1.3x0.2 N/A N/A Measurements L x W x D (cm) 1.736 N/A N/A A (cm)  : rea 0.347 N/A N/A Volume (cm) : 49.80% N/A N/A % Reduction in A rea: 66.50% N/A N/A % Reduction in Volume: Full Thickness Without Exposed N/A N/A Classification: Support Structures Medium N/A N/A Exudate Amount: Serosanguineous N/A N/A Exudate Type: red, brown N/A N/A Exudate Color: Small (1-33%) N/A N/A Granulation Amount: Red, Pink N/A N/A Granulation Quality: Medium (34-66%) [name]: Fat Layer (Subcutaneous Tissue): Yes N/A N/A Exposed Structures: Fascia: No Tendon: No Muscle: No Joint: No Bone: No None N/A N/A Epithelialization: Treatment Notes Electronic Signature(s) Signed: 10/17/2023 4:53:00 PM By: Midge Aver MSN RN CNS Darene Lamer, Makenzie [name] ([id number]) [phone].pdf Page 6 of 10 Entered By: Midge Aver on 10/17/2023 12:59:42 -------------------------------------------------------------------------------- Multi-Disciplinary Care Plan Details Patient Name: Date of Service: JANELI, PLUMER 10/17/2023 12:30 PM Medical Record Number: [id number] Patient Account Number: [id number] Date of Birth/Sex: Treating RN: 10-Jun-1981 (42 y.o. [name] Primary Care Quindon Denker: PA Zenovia Jordan, NO Other Clinician: Referring Tondalaya Perren: Treating Omarius Grantham/Extender: Allen Derry Self, Referral Weeks in Treatment: 16 Active Inactive Wound/Skin Impairment Nursing Diagnoses: Impaired tissue integrity Knowledge deficit related to ulceration/compromised skin integrity Goals: Patient/caregiver will verbalize understanding of skin care regimen Date Initiated: 06/27/2023 Date Inactivated: 08/01/2023 Target Resolution Date: 07/28/2023 Goal Status: Met Ulcer/skin breakdown will have a volume reduction of 30% by week 4 Date Initiated: 06/27/2023 Date Inactivated: 08/01/2023 Target Resolution Date: 07/28/2023 Goal Status: Met Ulcer/skin breakdown will have a volume reduction of 50% by week 8 Date Initiated: 06/27/2023 Date Inactivated: 08/22/2023 Target  Resolution Date: 08/27/2023 Goal Status: Met Ulcer/skin breakdown will have a volume reduction of 80% by week 12 Date Initiated: 06/27/2023 Target Resolution Date: 11/27/2023 Goal Status: Active Interventions: Assess patient/caregiver ability to obtain necessary supplies Assess patient/caregiver ability to perform ulcer/skin care regimen upon admission and as needed Assess ulceration(s) every visit Provide education on ulcer and skin care Treatment Activities: Skin care regimen initiated : 06/27/2023 Notes: Electronic Signature(s) Signed: 10/17/2023 4:53:00 PM By: Midge Aver MSN RN CNS WTA Entered By: Midge Aver on 10/17/2023 13:02:39 Mccullers, Zilda [name] ([id number]) [id number] Page 7 of 10 -------------------------------------------------------------------------------- Pain Assessment Details Patient Name: Date of ServiceDONIS, AMADO [name]. 10/17/2023 12:30 PM Medical Record Number: [id number] Patient Account Number: [id number] Date of Birth/Sex: Treating RN: 08-19-1981 (42 y.o. [name] Primary Care Nohelia Valenza: PA Zenovia Jordan, NO Other Clinician: Referring Theodore Rahrig: Treating Nadir Vasques/Extender: Allen Derry Self, Referral Weeks in Treatment: 16 Active Problems Location of Pain Severity and Description of Pain Patient Has Paino  No Site Locations Pain Management and Medication Current Pain Management: Electronic Signature(s) Signed: 10/17/2023 4:53:00 PM By: Midge Aver MSN RN CNS WTA Entered By: Midge Aver on 10/17/2023 12:44:58 -------------------------------------------------------------------------------- Patient/Caregiver Education Details Patient Name: Date of Service: [name], Elisabeth [name]. 11/27/2024andnbsp12:30 PM Medical Record Number: [id number] Patient Account Number: [id number] Date of Birth/Gender: Treating RN: 06-Apr-1981 (42 y.o. [name] Primary Care Physician: PA Zenovia Jordan, [place] Other Clinician: Referring Physician: Treating Physician/Extender:  Allen Derry Self, Referral Weeks in Treatment: [address] Sharne [name] ([id number]) [id number] Page 8 of 10 Education Assessment Education Provided To: Patient Education Topics Provided Wound/Skin Impairment: Handouts: Caring for Your Ulcer Methods: Explain/Verbal Responses: State content correctly Electronic Signature(s) Signed: 10/17/2023 4:53:00 PM By: Midge Aver MSN RN CNS WTA Entered By: Midge Aver on 10/17/2023 13:02:55 -------------------------------------------------------------------------------- Wound Assessment Details Patient Name: Date of Service: [name], Neilani [name]. 10/17/2023 12:30 PM Medical Record Number: [id number] Patient Account Number: [id number] Date of Birth/Sex: Treating RN: Oct 31, 1981 (42 y.o. [name] Primary Care Ermalinda Joubert: PA Zenovia Jordan, NO Other Clinician: Referring Millee Denise: Treating Pepper Wyndham/Extender: Allen Derry Self, Referral Weeks in Treatment: 16 Wound Status Wound Number: 7 Primary Etiology: Venous Leg Ulcer Wound Location: Right, Lateral Lower Leg Wound Status: Open Wounding Event: Gradually Appeared Comorbid History: Lymphedema, Hypertension Date Acquired: 06/06/2023 Weeks Of Treatment: 16 Clustered Wound: No Photos Wound Measurements Length: (cm) 1.7 Width: (cm) 1.3 Depth: (cm) 0.2 Area: (cm) 1.736 Volume: (cm) 0.347 % Reduction in Area: 49.8% % Reduction in Volume: 66.5% Epithelialization: None Wound Description Classification: Full Thickness Without Exposed Support Structures Exudate Amount: Medium Exudate Type: Serosanguineous Kirsh, Arthea [name] ([id number]) Exudate Color: red, brown Foul Odor After Cleansing: No Slough/Fibrino Yes [id number] Page 9 of 10 Wound Bed Granulation Amount: Small (1-33%) Exposed Structure Granulation Quality: Red, Pink Fascia Exposed: No Necrotic Amount: Medium (34-66%) Fat Layer (Subcutaneous Tissue) Exposed: Yes Necrotic Quality: Adherent  Slough Tendon Exposed: No Muscle Exposed: No Joint Exposed: No Bone Exposed: No Treatment Notes Wound #7 (Lower Leg) Wound Laterality: Right, Lateral Cleanser Soap and Water Discharge Instruction: Gently cleanse wound with antibacterial soap, rinse and pat dry prior to dressing wounds Vashe 5.8 (oz) Discharge Instruction: Use vashe 5.8 (oz) as directed Peri-Wound Care Topical Primary Dressing Hydrofera Blue Ready Transfer Foam, 2.5x2.5 (in/in) Discharge Instruction: Apply Hydrofera Blue Ready to wound bed as directed Secondary Dressing Zetuvit Plus 4x8 (in/in) Secured With Compression Wrap Urgo K2, two layer compression system, large Compression Stockings Add-Ons Electronic Signature(s) Signed: 10/17/2023 4:53:00 PM By: Midge Aver MSN RN CNS WTA Entered By: Midge Aver on 10/17/2023 12:49:32 -------------------------------------------------------------------------------- Vitals Details Patient Name: Date of Service: [facility], Julanne [name]. 10/17/2023 12:30 PM Medical Record Number: [id number] Patient Account Number: [id number] Date of Birth/Sex: Treating RN: [birth year] (42 y.o. [name] Primary Care Erionna Strum: PA Zenovia Jordan, [place] Other Clinician: Referring Cyrena Kuchenbecker: Treating Larie Mathes/Extender: Allen Derry Self, Referral Weeks in Treatment: 16 Vital Signs Time Taken: 12:40 Temperature (F): 97.8 Height (in): 73 Pulse (bpm): 91 Weight (lbs): 436 Respiratory Rate (breaths/min): 18 Body Mass Index (BMI): 57.5 Blood Pressure (mmHg): 134/72 Reference Range: 80 - 120 mg / dl Ehle, Shenell [name] ([id number]) [id number] Page 10 of 10 Electronic Signature(s) Signed: 10/17/2023 4:53:00 PM By: Midge Aver MSN RN CNS WTA Entered By: Midge Aver on 10/17/2023 12:44:47

## 2023-10-24 ENCOUNTER — Encounter: Payer: Self-pay | Attending: Physician Assistant

## 2023-10-24 DIAGNOSIS — I87311 Chronic venous hypertension (idiopathic) with ulcer of right lower extremity: Secondary | ICD-10-CM | POA: Insufficient documentation

## 2023-10-24 DIAGNOSIS — L97812 Non-pressure chronic ulcer of other part of right lower leg with fat layer exposed: Secondary | ICD-10-CM | POA: Insufficient documentation

## 2023-10-24 DIAGNOSIS — I872 Venous insufficiency (chronic) (peripheral): Secondary | ICD-10-CM | POA: Insufficient documentation

## 2023-10-24 DIAGNOSIS — I890 Lymphedema, not elsewhere classified: Secondary | ICD-10-CM | POA: Insufficient documentation

## 2023-10-24 NOTE — Progress Notes (Signed)
[name], [name] ([id number]) [id number] Page 1 of 3 Visit Report for 10/24/2023 Arrival Information Details Patient Name: Date of Service: [name], [name] 10/24/2023 8:15 A M Medical Record Number: [id number] Patient Account Number: [id number] Date of Birth/Sex: Treating RN: [birth year] (42 y.o. [name] Primary Care Harper Vandervoort: PA Zenovia Jordan, NO Other Clinician: Referring Lucky Trotta: Treating Kamari Bilek/Extender: Allen Derry Self, Referral Weeks in Treatment: 17 Visit Information History Since Last Visit Added or deleted any medications: No Patient Arrived: Ambulatory Any new allergies or adverse reactions: No Arrival Time: 08:20 Had a fall or experienced change in No Accompanied By: self activities of daily living that may affect Transfer Assistance: None risk of falls: Patient Identification Verified: Yes Hospitalized since last visit: No Secondary Verification Process Completed: Yes Has Dressing in Place as Prescribed: Yes Patient Requires Transmission-Based Precautions: No Has Compression in Place as Prescribed: Yes Patient Has Alerts: No Pain Present Now: No Electronic Signature(s) Signed: 10/24/2023 9:42:34 AM By: Midge Aver MSN RN CNS WTA Entered By: Midge Aver on 10/24/2023 06:42:34 -------------------------------------------------------------------------------- Compression Therapy Details Patient Name: Date of Service: [name], [name] 10/24/2023 8:15 A M Medical Record Number: [id number] Patient Account Number: [id number] Date of Birth/Sex: Treating RN: May 16, 1981 (42 y.o. [name] Primary Care Madell Heino: PA Zenovia Jordan, NO Other Clinician: Referring Kim Oki: Treating Karyl Sharrar/Extender: Allen Derry Self, Referral Weeks in Treatment: 17 Compression Therapy Performed for Wound Assessment: Wound #7 Right,Lateral Lower Leg Performed By: Clinician Midge Aver, RN Compression Type: Four Layer Electronic Signature(s) Signed: 10/24/2023 9:43:20  AM By: Midge Aver MSN RN CNS WTA Entered By: Midge Aver on 10/24/2023 06:43:20 [name], [name] ([id number]) [id number] Page 2 of 3 -------------------------------------------------------------------------------- Encounter Discharge Information Details Patient Name: Date of Service: [name], [name] 10/24/2023 8:15 A M Medical Record Number: [id number] Patient Account Number: [id number] Date of Birth/Sex: Treating RN: 1981/11/13 (42 y.o. [name] Primary Care Rynn Markiewicz: PA Zenovia Jordan, NO Other Clinician: Referring Rourke Mcquitty: Treating Goebel Hellums/Extender: Allen Derry Self, Referral Weeks in Treatment: 17 Encounter Discharge Information Items Discharge Condition: Stable Ambulatory Status: Ambulatory Discharge Destination: Home Transportation: Private Auto Accompanied By: self Schedule Follow-up Appointment: Yes Clinical Summary of Care: Electronic Signature(s) Signed: 10/24/2023 9:44:17 AM By: Midge Aver MSN RN CNS WTA Entered By: Midge Aver on 10/24/2023 06:44:17 -------------------------------------------------------------------------------- Wound Assessment Details Patient Name: Date of Service: [name], [name] 10/24/2023 8:15 A M Medical Record Number: [id number] Patient Account Number: [id number] Date of Birth/Sex: Treating RN: Mar 21, 1981 (42 y.o. [name] Primary Care Kezia Benevides: PA Zenovia Jordan, NO Other Clinician: Referring Marvella Jenning: Treating Linzey Ramser/Extender: Allen Derry Self, Referral Weeks in Treatment: 17 Wound Status Wound Number: 7 Primary Etiology: Venous Leg Ulcer Wound Location: Right, Lateral Lower Leg Wound Status: Open Wounding Event: Gradually Appeared Comorbid History: Lymphedema, Hypertension Date Acquired: 06/06/2023 Weeks Of Treatment: 17 Clustered Wound: No Wound Measurements Length: (cm) 1.7 Width: (cm) 1.3 Depth: (cm) 0.2 Area: (cm) 1.736 Volume: (cm) 0.347 % Reduction in Area: 49.8% % Reduction in Volume:  66.5% Epithelialization: None Wound Description Classification: Full Thickness Without Exposed Support [name], [name] ([id number]) Exudate Amount: Medium Exudate Type: Serosanguineous Exudate Color: red, brown Structures Foul Odor After Cleansing: No [id number] Page 3 of 3 Slough/Fibrino Yes Wound Bed Granulation Amount: Small (1-33%) Exposed Structure Granulation Quality: Red, Pink Fascia Exposed: No Necrotic Amount: Medium (34-66%) Fat Layer (Subcutaneous Tissue) Exposed: Yes Necrotic Quality: Adherent Slough Tendon Exposed: No Muscle Exposed: No Joint Exposed: No Bone Exposed: No Treatment Notes Wound #7 (Lower Leg) Wound Laterality: Right, Lateral Cleanser Soap  and Water Discharge Instruction: Gently cleanse wound with antibacterial soap, rinse and pat dry prior to dressing wounds Vashe 5.8 (oz) Discharge Instruction: Use vashe 5.8 (oz) as directed Peri-Wound Care Topical Primary Dressing Hydrofera Blue Ready Transfer Foam, 2.5x2.5 (in/in) Discharge Instruction: Apply Hydrofera Blue Ready to wound bed as directed Secondary Dressing Zetuvit Plus 4x8 (in/in) Secured With Compression Wrap Urgo K2, two layer compression system, large Compression Stockings Add-Ons Electronic Signature(s) Signed: 10/24/2023 9:42:51 AM By: Midge Aver MSN RN CNS WTA Entered By: Midge Aver on 10/24/2023 06:42:51

## 2023-10-25 NOTE — Progress Notes (Signed)
[name], [name] ([id number]) [id number] Page 1 of 2 Visit Report for 10/24/2023 Physician Orders Details Patient Name: Date of Service: [name], [name] 10/24/2023 8:15 A M Medical Record Number: [id number] Patient Account Number: [id number] Date of Birth/Sex: Treating RN: 01-04-1981 (42 y.o. [name] Primary Care Provider: PA Zenovia Jordan, NO Other Clinician: Referring Provider: Treating Provider/Extender: Allen Derry Self, Referral Weeks in Treatment: 17 The following information was scribed by: Midge Aver The information was scribed for: Allen Derry Verbal / Phone Orders: No Diagnosis Coding Follow-up Appointments Return Appointment in 1 week. Nurse Visit as needed Bathing/ Shower/ Hygiene May shower with wound dressing protected with water repellent cover or cast protector. No tub bath. Anesthetic (Use 'Patient Medications' Section for Anesthetic Order Entry) Lidocaine applied to wound bed Edema Control - Orders / Instructions Optional: One layer of unna paste to top of compression wrap (to act as an anchor). UrgoK2 - large Wound Treatment Wound #7 - Lower Leg Wound Laterality: Right, Lateral Cleanser: Soap and Water 1 x Per Week/30 Days Discharge Instructions: Gently cleanse wound with antibacterial soap, rinse and pat dry prior to dressing wounds Cleanser: Vashe 5.8 (oz) 1 x Per Week/30 Days Discharge Instructions: Use vashe 5.8 (oz) as directed Prim Dressing: Hydrofera Blue Ready Transfer Foam, 2.5x2.5 (in/in) 1 x Per Week/30 Days ary Discharge Instructions: Apply Hydrofera Blue Ready to wound bed as directed Secondary Dressing: Zetuvit Plus 4x8 (in/in) 1 x Per Week/30 Days Compression Wrap: Urgo K2, two layer compression system, large 1 x Per Week/30 Days Electronic Signature(s) Signed: 10/24/2023 9:43:55 AM By: Midge Aver MSN RN CNS WTA Signed: 10/25/2023 7:39:18 AM By: Allen Derry PA-C Entered By: Midge Aver on 10/24/2023  06:43:54 Gildner, Anis [name] ([id number]) [id number] Page 2 of 2 -------------------------------------------------------------------------------- SuperBill Details Patient Name: Date of ServiceVENYA, [name] 10/24/2023 Medical Record Number: [id number] Patient Account Number: [id number] Date of Birth/Sex: Treating RN: [birth year] (42 y.o. 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

## 2023-11-02 ENCOUNTER — Encounter: Payer: Self-pay | Admitting: Physician Assistant

## 2023-11-02 NOTE — Progress Notes (Signed)
[name], [name] ([id number]) [id number] Page 1 of 9 Visit Report for 11/02/2023 Arrival Information Details Patient Name: Date of Service: [name] 11/02/2023 8:45 A M Medical Record Number: [id number] Patient Account Number: [id number] Date of Birth/Sex: Treating RN: May 13, 1981 (42 y.o. [name] Primary Care Joelynn Dust: PA Zenovia Jordan, NO Other Clinician: Referring Kazue Cerro: Treating Vanellope Passmore/Extender: Allen Derry Self, Referral Weeks in Treatment: 18 Visit Information History Since Last Visit Added or deleted any medications: No Patient Arrived: Ambulatory Any new allergies or adverse reactions: No Arrival Time: 09:04 Had a fall or experienced change in No Accompanied By: self activities of daily living that may affect Transfer Assistance: None risk of falls: Patient Identification Verified: Yes Hospitalized since last visit: No Secondary Verification Process Completed: Yes Has Dressing in Place as Prescribed: Yes Patient Requires Transmission-Based Precautions: No Has Compression in Place as Prescribed: Yes Patient Has Alerts: No Pain Present Now: No Electronic Signature(s) Signed: 11/02/2023 12:20:17 PM By: Midge Aver MSN RN CNS WTA Entered By: Midge Aver on 11/02/2023 06:04:38 -------------------------------------------------------------------------------- Clinic Level of Care Assessment Details Patient Name: Date of Service: [name], [name] 11/02/2023 8:45 A M Medical Record Number: [id number] Patient Account Number: [id number] Date of Birth/Sex: Treating RN: 09/02/1981 (42 y.o. [name] Primary Care Meko Bellanger: PA Zenovia Jordan, NO Other Clinician: Referring Doshia Dalia: Treating Lashia Niese/Extender: Allen Derry Self, Referral Weeks in Treatment: 18 Clinic Level of Care Assessment Items TOOL 1 Quantity Score []  - 0 Use when EandM and Procedure is performed on INITIAL visit ASSESSMENTS - Nursing Assessment / Reassessment []  -  0 General Physical Exam (combine w/ comprehensive assessment (listed just below) when performed on new pt. evals) []  - 0 Comprehensive Assessment (HX, ROS, Risk Assessments, Wounds Hx, etc.) ASSESSMENTS - Wound and Skin Assessment / Reassessment []  - 0 Dermatologic / Skin Assessment (not related to wound area) [name] ([id number]) [id number] Page 2 of 9 ASSESSMENTS - Ostomy and/or Continence Assessment and Care []  - 0 Incontinence Assessment and Management []  - 0 Ostomy Care Assessment and Management (repouching, etc.) PROCESS - Coordination of Care []  - 0 Simple Patient / Family Education for ongoing care []  - 0 Complex (extensive) Patient / Family Education for ongoing care []  - 0 Staff obtains Chiropractor, Records, T Results / Process Orders est []  - 0 Staff telephones HHA, Nursing Homes / Clarify orders / etc []  - 0 Routine Transfer to another Facility (non-emergent condition) []  - 0 Routine [name] Admission (non-emergent condition) []  - 0 New Admissions / Manufacturing engineer / Ordering NPWT Apligraf, etc. , []  - 0 Emergency [name] Admission (emergent condition) PROCESS - Special Needs []  - 0 Pediatric / Minor Patient Management []  - 0 Isolation Patient Management []  - 0 Hearing / Language / Visual special needs []  - 0 Assessment of Community assistance (transportation, D/C planning, etc.) []  - 0 Additional assistance / Altered mentation []  - 0 Support Surface(s) Assessment (bed, cushion, seat, etc.) INTERVENTIONS - Miscellaneous []  - 0 External ear exam []  - 0 Patient Transfer (multiple staff / Nurse, adult / Similar devices) []  - 0 Simple Staple / Suture removal (25 or less) []  - 0 Complex Staple / Suture removal (26 or more) []  - 0 Hypo/Hyperglycemic Management (do not check if billed separately) []  - 0 Ankle / Brachial Index (ABI) - do not check if billed separately Has the patient been seen at the [name] within  the last three years: Yes Total Score: 0 Level Of Care: ____ Electronic Signature(s) Signed: 11/02/2023 12:20:17  PM By: Midge Aver MSN RN CNS WTA Entered By: Midge Aver on 11/02/2023 06:37:36 -------------------------------------------------------------------------------- Compression Therapy Details Patient Name: Date of Service: [name], [name] 11/02/2023 8:45 A M Medical Record Number: [id number] Patient Account Number: [id number] Date of Birth/Sex: Treating RN: 1981-01-22 (42 y.o. [name] Primary Care Milo Schreier: PA Zenovia Jordan, NO Other Clinician: Referring Rheagan Nayak: Treating Argenis Kumari/Extender: Allen Derry Self, Referral Weeks in Treatment: 18 Compression Therapy Performed for Wound Assessment: Wound #7 Right,Lateral Lower Leg Performed By: [name], RN Compression Type: Four Pleas [name] [id number] Page 3 of 9 Post Procedure Diagnosis Same as Pre-procedure Electronic Signature(s) Signed: 11/02/2023 12:20:17 PM By: Midge Aver MSN RN CNS WTA Entered By: Midge Aver on 11/02/2023 06:25:33 -------------------------------------------------------------------------------- Encounter Discharge Information Details Patient Name: Date of Service: [name], [name] 11/02/2023 8:45 A M Medical Record Number: [id number] Patient Account Number: [id number] Date of Birth/Sex: Treating RN: 29-Jun-1981 (42 y.o. [name] Primary Care Garcia Dalzell: PA Zenovia Jordan, NO Other Clinician: Referring Nallely Yost: Treating Elchanan Bob/Extender: Allen Derry Self, Referral Weeks in Treatment: 31 Encounter Discharge Information Items Post Procedure Vitals Discharge Condition: Stable Temperature (F): 98.5 Ambulatory Status: Ambulatory Pulse (bpm): 82 Discharge Destination: Home Respiratory Rate (breaths/min): 18 Transportation: Private Auto Blood Pressure (mmHg): 117/95 Accompanied By: self Schedule Follow-up Appointment: Yes Clinical Summary  of Care: Electronic Signature(s) Signed: 11/02/2023 12:20:17 PM By: Midge Aver MSN RN CNS WTA Entered By: Midge Aver on 11/02/2023 06:41:35 -------------------------------------------------------------------------------- Lower Extremity Assessment Details Patient Name: Date of Service: [name], [name] 11/02/2023 8:45 A M Medical Record Number: [id number] Patient Account Number: [id number] Date of Birth/Sex: Treating RN: [birth year] (42 y.o. [name] Primary Care Micole Delehanty: PA Zenovia Jordan, NO Other Clinician: Referring Forest Pruden: Treating Krystal Teachey/Extender: Allen Derry Self, Referral Weeks in Treatment: 18 Edema Assessment Assessed: [Left: No] [Right: Yes] Edema: [Left: N] [Right: o] Calf Chriswell, Jae [name] ([id number]) [id number] Page 4 of 9 Left: Right: Point of Measurement: 38 cm From Medial Instep 57 cm Ankle Left: Right: Point of Measurement: 13 cm From Medial Instep 34 cm Vascular Assessment Pulses: Dorsalis Pedis Palpable: [Right:Yes] Extremity colors, hair growth, and conditions: Extremity Color: [Right:Hyperpigmented] Hair Growth on Extremity: [Right:No] Temperature of Extremity: [Right:Warm] Capillary Refill: [Right:< 3 seconds] Dependent Rubor: [Right:No] Blanched when Elevated: [Right:No No] Toe Nail Assessment Left: Right: Thick: Yes Discolored: Yes Deformed: Yes Improper Length and Hygiene: Yes Electronic Signature(s) Signed: 11/02/2023 12:20:17 PM By: Midge Aver MSN RN CNS WTA Entered By: Midge Aver on 11/02/2023 06:18:39 -------------------------------------------------------------------------------- Multi Wound Chart Details Patient Name: Date of Service: [name], [name] 11/02/2023 8:45 A M Medical Record Number: [id number] Patient Account Number: [id number] Date of Birth/Sex: Treating RN: 1981/05/14 (42 y.o. [name] Primary Care Liseth Wann: PA Zenovia Jordan, NO Other Clinician: Referring Lanore Renderos: Treating  Kisha Messman/Extender: Allen Derry Self, Referral Weeks in Treatment: 18 Vital Signs Height(in): 73 Pulse(bpm): 82 Weight(lbs): 436 Blood Pressure(mmHg): 117/95 Body Mass Index(BMI): 57.5 Temperature(F): 98.5 Respiratory Rate(breaths/min): 18 [7:Photos:] [N/A:N/A] Dineen, Duchess [name] ([id number]) [7:Right, Lateral Lower Leg Wound Location: Gradually Appeared Wounding Event: Venous Leg Ulcer Primary Etiology: Lymphedema, Hypertension Comorbid History: 06/06/2023 Date Acquired: 18 Weeks of Treatment: Open Wound Status: No Wound Recurrence:  0.2x0.3x0.1 Measurements L x W x D (cm) 0.047 A (cm) : rea 0.005 Volume (cm) : 98.60% % Reduction in A rea: 99.50% % Reduction in Volume: Full Thickness Without Exposed Classification: Support Structures Medium Exudate Amount: Serosanguineous Exudate  Type: red, brown Exudate Color: Small (1-33%) Granulation Amount: Red, Pink Granulation Quality: Medium (34-66%)  Necrotic Amount: Fat Layer (Subcutaneous Tissue): Yes N/A Exposed Structures: Fascia: No Tendon: No Muscle: No Joint: No Bone: No None  Epithelialization:] [N/A:N/A N/A N/A N/A N/A N/A N/A N/A N/A N/A N/A N/A N/A N/A N/A N/A N/A N/A N/A N/A N/A] Treatment Notes Electronic Signature(s) Signed: 11/02/2023 12:20:17 PM By: Midge Aver MSN RN CNS WTA Entered By: Midge Aver on 11/02/2023 06:22:31 -------------------------------------------------------------------------------- Multi-Disciplinary Care Plan Details Patient Name: Date of Service: [name], [name] 11/02/2023 8:45 A M Medical Record Number: [id number] Patient Account Number: [id number] Date of Birth/Sex: Treating RN: [birth year] (42 y.o. [name] Primary Care Zellie Jenning: PA Zenovia Jordan, NO Other Clinician: Referring Mora Pedraza: Treating Makeila Yamaguchi/Extender: Allen Derry Self, Referral Weeks in Treatment: 68 Active Inactive Wound/Skin Impairment Nursing Diagnoses: Impaired tissue integrity Knowledge deficit related to ulceration/compromised skin  integrity Goals: Patient/caregiver will verbalize understanding of skin care regimen Date Initiated: 06/27/2023 Date Inactivated: 08/01/2023 Target Resolution Date: 07/28/2023 Goal Status: Met Ulcer/skin breakdown will have a volume reduction of 30% by week 4 Date Initiated: 06/27/2023 Date Inactivated: 08/01/2023 Target Resolution Date: 07/28/2023 Goal Status: Met Ulcer/skin breakdown will have a volume reduction of 50% by week 8 Date Initiated: 06/27/2023 Date Inactivated: 08/22/2023 Target Resolution Date: 08/27/2023 Goal Status: LETONIA, NICODEMUS [name] ([id number]) [id number] Page 6 of 9 Ulcer/skin breakdown will have a volume reduction of 80% by week 12 Date Initiated: 06/27/2023 Target Resolution Date: 11/27/2023 Goal Status: Active Interventions: Assess patient/caregiver ability to obtain necessary supplies Assess patient/caregiver ability to perform ulcer/skin care regimen upon admission and as needed Assess ulceration(s) every visit Provide education on ulcer and skin care Treatment Activities: Skin care regimen initiated : 06/27/2023 Notes: Electronic Signature(s) Signed: 11/02/2023 12:20:17 PM By: Midge Aver MSN RN CNS WTA Entered By: Midge Aver on 11/02/2023 06:37:56 -------------------------------------------------------------------------------- Pain Assessment Details Patient Name: Date of Service: [name], Magie [name]. 11/02/2023 8:45 A M Medical Record Number: [id number] Patient Account Number: [id number] Date of Birth/Sex: Treating RN: [birth year] (42 y.o. [name] Primary Care Oluwadamilare Tobler: PA Zenovia Jordan, NO Other Clinician: Referring Micaella Gitto: Treating Aubry Rankin/Extender: Allen Derry Self, Referral Weeks in Treatment: 18 Active Problems Location of Pain Severity and Description of Pain Patient Has Paino No Site Locations Pain Management and Medication Current Pain Management: Electronic Signature(s) Signed: 11/02/2023 12:20:17 PM By: Midge Aver MSN  RN CNS WTA Entered By: Midge Aver on 11/02/2023 06:08:37 Rebuck, [name] ([id number]) [id number] Page 7 of 9 -------------------------------------------------------------------------------- Patient/Caregiver Education Details Patient Name: Date of Service: [name], [name] 12/13/2024andnbsp8:45 A M Medical Record Number: [id number] Patient Account Number: [id number] Date of Birth/Gender: Treating RN: 1981/06/22 (42 y.o. [name] Primary Care Physician: PA Zenovia Jordan, NO Other Clinician: Referring Physician: Treating Physician/Extender: Allen Derry Self, Referral Weeks in Treatment: 15 Education Assessment Education Provided To: Patient Education Topics Provided Wound/Skin Impairment: Handouts: Caring for Your Ulcer Methods: Explain/Verbal Responses: State content correctly Electronic Signature(s) Signed: 11/02/2023 12:20:17 PM By: Midge Aver MSN RN CNS WTA Entered By: Midge Aver on 11/02/2023 06:39:51 -------------------------------------------------------------------------------- Wound Assessment Details Patient Name: Date of Service: [name], [name] 11/02/2023 8:45 A M Medical Record Number: [id number] Patient Account Number: [id number] Date of Birth/Sex: Treating RN: [birth year] (42 y.o. [name] Primary Care Marlisa Caridi: PA Zenovia Jordan, NO Other Clinician: Referring Burna Atlas: Treating Rainn Zupko/Extender: Allen Derry Self, Referral Weeks in Treatment: 18 Wound Status Wound Number: 7 Primary Etiology: Venous Leg Ulcer Wound Location: Right, Lateral Lower Leg Wound Status: Open Wounding Event: Gradually Appeared Comorbid History: Lymphedema, Hypertension Date Acquired: 06/06/2023 Weeks Of Treatment: 18  Clustered Wound: No Photos MIKE, ROYALTY [name] ([id number]) [id number] Page 8 of 9 Wound Measurements Length: (cm) 0.2 Width: (cm) 0.3 Depth: (cm) 0.1 Area: (cm) 0.047 Volume: (cm) 0.005 % Reduction in Area: 98.6% %  Reduction in Volume: 99.5% Epithelialization: None Wound Description Classification: Full Thickness Without Exposed Suppor Exudate Amount: Medium Exudate Type: Serosanguineous Exudate Color: red, brown t Structures Foul Odor After Cleansing: No Slough/Fibrino Yes Wound Bed Granulation Amount: Small (1-33%) Exposed Structure Granulation Quality: Red, Pink Fascia Exposed: No Necrotic Amount: Medium (34-66%) Fat Layer (Subcutaneous Tissue) Exposed: Yes Necrotic Quality: Adherent Slough Tendon Exposed: No Muscle Exposed: No Joint Exposed: No Bone Exposed: No Treatment Notes Wound #7 (Lower Leg) Wound Laterality: Right, Lateral Cleanser Soap and Water Discharge Instruction: Gently cleanse wound with antibacterial soap, rinse and pat dry prior to dressing wounds Vashe 5.8 (oz) Discharge Instruction: Use vashe 5.8 (oz) as directed Peri-Wound Care Topical Primary Dressing Hydrofera Blue Ready Transfer Foam, 2.5x2.5 (in/in) Discharge Instruction: Apply Hydrofera Blue Ready to wound bed as directed Secondary Dressing Zetuvit Plus 4x4 (in/in) Secured With Compression Wrap Urgo K2, two layer compression system, large Compression Stockings Add-Ons Electronic Signature(s) Signed: 11/02/2023 12:20:17 PM By: Midge Aver MSN RN CNS WTA Entered By: Midge Aver on 11/02/2023 06:17:19 Garman, Joyce [name] ([id number]) [id number] Page 9 of 9 -------------------------------------------------------------------------------- Vitals Details Patient Name: Date of Service: [name], POWLES. 11/02/2023 8:45 A M Medical Record Number: [id number] Patient Account Number: [id number] Date of Birth/Sex: Treating RN: October 17, 1981 (42 y.o. [name] Primary Care Tavia Stave: PA Zenovia Jordan, NO Other Clinician: Referring Arrianna Catala: Treating Kassidee Narciso/Extender: Allen Derry Self, Referral Weeks in Treatment: 18 Vital Signs Time Taken: 09:06 Temperature (F): 98.5 Height (in): 73 Pulse  (bpm): 82 Weight (lbs): 436 Respiratory Rate (breaths/min): 18 Body Mass Index (BMI): 57.5 Blood Pressure (mmHg): 117/95 Reference Range: 80 - 120 mg / dl Electronic Signature(s) Signed: 11/02/2023 12:20:17 PM By: Midge Aver MSN RN CNS WTA Entered By: Midge Aver on 11/02/2023 06:08:30

## 2023-11-02 NOTE — Progress Notes (Addendum)
[name], [name] ([id number]) [id number] Page 1 of 11 Visit Report for 11/02/2023 Chief Complaint Document Details Patient Name: Date of Service: KHAIA, SEATON. 11/02/2023 8:45 A M Medical Record Number: [id number] Patient Account Number: [id number] Date of Birth/Sex: Treating RN: June 12, 1981 (42 y.o. [name] Primary Care Provider: PA Zenovia Jordan, [place] Other Clinician: Referring Provider: Treating Provider/Extender: Allen Derry Self, Referral Weeks in Treatment: 18 Information Obtained from: Patient Chief Complaint 06/27/2023; right lower extremity wound Electronic Signature(s) Signed: 11/02/2023 9:14:18 AM By: Allen Derry PA-C Entered By: Allen Derry on 11/02/2023 06:14:18 -------------------------------------------------------------------------------- Debridement Details Patient Name: Date of Service: [name], [name] 11/02/2023 8:45 A M Medical Record Number: [id number] Patient Account Number: [id number] Date of Birth/Sex: Treating RN: 03-10-1981 (42 y.o. [name] Primary Care Provider: PA Zenovia Jordan, [place] Other Clinician: Referring Provider: Treating Provider/Extender: Allen Derry Self, Referral Weeks in Treatment: 18 Debridement Performed for Assessment: Wound #7 Right,Lateral Lower Leg Performed By: Physician Allen Derry, PA-C The following information was scribed by: Midge Aver The information was scribed for: Allen Derry Debridement Type: Debridement Severity of Tissue Pre Debridement: Fat layer exposed Level of Consciousness (Pre-procedure): Awake and Alert Pre-procedure Verification/Time Out Yes - 09:23 Taken: Start Time: 09:23 Pain Control: Lidocaine 4% Topical Solution Percent of Wound Bed Debrided: 100% T Area Debrided (cm): otal 0.05 Tissue and other material debrided: Non-Viable, Skin: Dermis , Skin: Epidermis Level: Skin/Epidermis Debridement Description: Selective/Open Wound Instrument: Curette Bleeding: Minimum [name], [name]  ([id number]) [id number] Page 2 of 11 Hemostasis Achieved: Pressure Procedural Pain: 0 Post Procedural Pain: 0 Response to Treatment: Procedure was tolerated well Level of Consciousness (Post- Awake and Alert procedure): Post Debridement Measurements of Total Wound Length: (cm) 0.2 Width: (cm) 0.3 Depth: (cm) 0.1 Volume: (cm) 0.005 Character of Wound/Ulcer Post Debridement: Stable Severity of Tissue Post Debridement: Fat layer exposed Post Procedure Diagnosis Same as Pre-procedure Electronic Signature(s) Signed: 11/02/2023 12:20:17 PM By: Midge Aver MSN RN CNS WTA Signed: 11/02/2023 12:55:12 PM By: Allen Derry PA-C Entered By: Midge Aver on 11/02/2023 06:24:53 -------------------------------------------------------------------------------- HPI Details Patient Name: Date of Service: [name], [name] 11/02/2023 8:45 A M Medical Record Number: [id number] Patient Account Number: [id number] Date of Birth/Sex: Treating RN: 19-Feb-1981 (42 y.o. [name] Primary Care Provider: PA Zenovia Jordan, [place] Other Clinician: Referring Provider: Treating Provider/Extender: Allen Derry Self, Referral Weeks in Treatment: 18 History of Present Illness HPI Description: 42 year old patient well known to our [facility] wound care clinic where she has been seen since 2016 for bilateral lower extremity venous insufficiency disease with lymphedema and multiple ulcerations associated with morbid obesity. she had custom-made compression stockings and lymphedema pumps which were used in the past. most recently she was admitted to the hospital between October 11 and 09/02/2017 with sepsis, lower extremity wounds and lymphedema.she was initially treated in the outpatient with Keflex and Bactrim. she was initially treated in the hospital with vancomycin and Zosyn and changed over to Unasyn until her white count improved and her blood cultures were negative for 3 days. After her  inpatient management she was discharged home on Augmentin to end on 09/13/2017 with a 14 day course. she has had outpatient vascular duplex scans completed in November 2017 and her right ABI was 1.1 and the left ABI is 1.3. she had normal toe brachial indices bilaterally.she had three-vessel runoff in the right lower extremity and two-vessel runoff in the left lower extremity. On questioning the patient she does have custom made compression stockings and also has a lymphedema  pump but has not been using it appropriately and has not been taking good care of herself. 09/17/2017 -- she returns today with compression stockings on the left side and the right side has had significant amount of drainage and has a very strong odor 09/24/2017 -- the drainage is increased significantly and she has more lymphedema and a very strong odor to her wound. Though she does not have systemic symptoms, or overt infection I believe she will benefit from some doxycycline given empirically. 10/01/2017 -- after starting the doxycycline and changing the dressing twice a week her symptoms and signs have definitely improved overall. 10/08/2017 -- she has completed her course of doxycycline but continues to have a lot of drainage and needs twice a week dressing changes. 11/08/17-she is here in follow-up evaluation for right lower extremity ulcers. She admits to using her lymphedema pumps twice daily, one hour per session. she is voicing no complaints or concerns, no signs of infection will change to [facility] 12/14/17 on evaluation today patient appears to be doing very well in regard to her wounds. She has been tolerating the dressing changes she continues to develop some portly the adherent granular tissue on the surface of the wound with some Slough. Obviously we are trying to get too much better wound bed. With that being said the hyper granulation the Hydrofera Blue Dressing to have helped with which is excellent news. However  I think it may be time to try something a little bit different at this point. 01/11/18 on evaluation today patient appears to be doing fairly well in regard to her right lateral lower extremity ulcers. This shows excellent signs of filling in [place], Donda [name] ([id number]) [id number] Page 3 of 11 which is great news. There does not appear to be any evidence of infection which is also good news. She does continue to work as well is good school. She is having no pain. 01/22/18 on evaluation today patient appears to be doing a little bit worse in my opinion in regard to the overall quality of that granulation on her right lower extremity. She was not here last week due to being sick with a stomach virus this may have something to do with the fact that her wound appears to be a little bit worse. With that being said I'm also thinking that after switching from the [facility] Dressing to the silver collagen would really has not looked that's good in my opinion. We may want to swit 02/11/18 on evaluation today patient's right lower/lateral lower extremity ulcers appear to be doing very well at this point. Especially the more proximal ulcer has filled in much closer to surface which is good news. Nonetheless both show signs of improvement which is great news. There does not appear to be any evidence of infection which is also good news. In general patient has been doing well tolerating the wraps as well as the [employer]. 02/18/18 on evaluation today patient appears to be doing a little bit worse in regard to the periwound region the wounds themselves do not look much deteriorated to me. With that being said she has several small blisters/pustules noted in the periwound and there was a significant amount of drainage and maceration compared to previous. There has been a time that we had to bring her back for twice a week dressing changes as far as her wrap was concerned it  has been a while since we've done that however. With that being said the patient  has been having some burning and in general I'm concerned about the possibility of infection. She has previously taken doxycycline with good result. Fortunately there does not appear to be any evidence of overall worsening in regard to the size of the wound and in fact the upper wound actually appears to be showing signs of good epithelialization. 03/18/18 on evaluation today patient appears to be doing excellent in regard to her right lateral lower extremity ulcer. She has been tolerating the dressing changes without complication. Fortunately this seems to be making great progress. Overall I see no signs of infection and there is dramatic improvement overall even compared to last week. 03/28/18 on evaluation today patient appears to be doing very well in regard to her right lateral lower surety ulcer. She has been tolerating the dressing changes without complication at this point. She states currently that she's having no significant discomfort which is excellent as well. Overall I'm pleased with how things seem to be progressing. 04/01/18 on evaluation today patient appears to be doing excellent in regard to her right lateral lower extremity ulcers. She has been tolerating the dressing changes without complication which is good news. With that being said the wraps still continues to show signs of helping with her fluid she does have Juxta-Lite compression stockings for when we are done with the current treatment regimen once everything heals. Mainly she just has the one area still remaining the smaller of the two wings is pretty much closed at this point there's just a very slight opening noted. 04/08/18 on evaluation today patient appears to be doing better in regard to the original wound on the right lateral lower extremity that we have been managing. Unfortunately she has a new area of weeping more anterior on the right  lateral lower extremity and on the left lower extremity she has two new ulcers there appears to be some cellulitis noted at this time. I am concerned about the fact that this may in fact be an infection that has caused the worsening and swelling in the past this has been the case when we previously attempted to determine what was going on when she had down slides like this. With that being said the patient is seeming to tolerate the wraps fairly well for the most part. 04/17/18; since last time the patient was seen in this clinic she was hospitalized from 04/11/18 through 04/13/18; she presented with bilateral lower extremity pain worse on the right and a fever of up to 104. Noteworthy that when she was in the clinic last week she had new wounds on the left leg culture grew MRSA and she was prescribed Bactrim. She had 2 days of IV Vanco and Zosyn in the hospital. Her blood cultures were negative. She was discharged on Bactrim to cover the original MRSA on the right leg and Keflex to cover the possibility of strep. The hospitalist had a conversation with infectious disease. The patient arrives in clinic today for nurse check however given the recent hospitalization I was asked to look at her. The patient states she feels a lot better. No fever or chills. Still some pain in the right calf but a lot better. She arrived in the hospital with a white count of 15.6, the next day was 10.8. Comprehensive metabolic panel was normal. She is still taking Keflex and Bactrim It would appear that she had a surgical IandD at the bedside of the right calf felt to have a underlying abscess. According to our intake  nurse the wound has expanded quite bit on the right lateral calf. She has no open area on the left anterior and left posterior calf as described last time 04/23/18 on evaluation today patient actually appears to be doing much better than when I last saw her. This obviously has been a couple weeks ago and in  the interim she was admitted to the hospital for IV antibiotic therapy due to cellulitis, discharge, and fortunately the substance which hadn't sued has completely resolved. Her swelling seems to be better in regard to her lower extremities as well and the wounds that opened up as results of the infection seems to be showing signs of improvement. There is some Slough noted on the left lateral wound otherwise a lot of weeping noted of the right lateral leg. 04/29/18 on evaluation today patient appears to be doing excellent in regard to her bilateral lower extremity ulcers. She's been tolerating the dressing changes without complication there does not appear to be any evidence of infection at this time. Overall I think she is made great improvement and seems to be showing signs of coming back around where she was prior to the cellulitis and sepsis episode. 05/06/18 on evaluation today patient appears to be doing better in regard to her bilateral ocean the ulcers. She's been tolerating the dressing changes without complication. Fortunately there does not appear to be any evidence of infection at this time. Her swelling is significantly down overall seems to be showing signs of good improvement as well. Fortunately I do believe that she is progressing nicely back to where she was prior to the cellulitis/sepsis issue. 05/13/18 on evaluation today patient seems to be showing signs of great improvement she's been tolerating the dressing changes without complication and overall there does not appear to be any evidence of infection. All of which is good news. The only issue she really have today is that some of the [facility] Dressing actually was stuck to the periwound of the right lateral lower extremity however this was able to be removed during the debridement without complication. 05/21/18 on evaluation today patient appears to be doing very well in regard to her bilateral lower extremity ulcers. She has  been tolerating the compression wraps without complication. There does not appear to be any evidence of infection at this point which is good news as well. Overall I'm very pleased with the progress that has been made. She likewise is very happy. 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 05/28/18 on evaluation today patient actually appears to be doing excellent in regard to her bilateral lower [place] ulcers. She's made great progress since last week and overall I'm very pleased in this regard. She states that she's not have any discomfort either which is also good news there's definitely no evidence of infection. 06/03/18 on evaluation today patient actually appears to be doing excellent in regard to her ulcerations in fact these areas on both lower extremities were almost completely healed. Unfortunately she has a situation going on her life right now she actually found her husband deceased last December 01, 2023. She states that she has been quite a bit upset since that time unfortunately this is obviously a very big blow to her. Nonetheless she is concerned about the funeral and having to wear certain shoes for this that she states she cannot fit in with the wraps. She wonders if she could  potentially come back on Friday to have her wraps changed and to switch into her Juxta-Lite compression at that point. I think that would be an okay thing that we could do for her. Nonetheless she fortunately seems to be tolerating the wraps very well and again has made excellent progress with the [facility] Dressing 06/11/18 on evaluation today patient appears to be doing rather well in regard to her bilateral lower extremities in fact everything appears to be completely healed at this point which is excellent news. There does not appear to be any evidence of infection at this time which is great. In fact  overall I do believe she is completely resolved in regard to her bilateral lower extremity ulcers. She is extremely happy in this regard. 06/27/2023 Ms. Jali Cockfield is a 42 year old female with a past medical history of venous insufficiency and lymphedema that presents to the clinic for a 3-week history of nonhealing wound to the right lower extremity. She states this started spontaneously. She does not wear compression stockings. She has been keeping the area covered. She currently denies signs of infection. ADHVIKA, HERSEY [name] ([id number]) [id number] Page 4 of 11 8/14; patient readmitted to clinic last week. She has venous insufficiency and chronic secondary lymphedema. She has a nonhealing wound on the right lateral lower leg. She has been using gent mupirocin and Hydrofera Blue using an Urgo K2 lite wrap. Her ABI on the right leg was 0.74 on admission to clinic. Formal arterial studies were ordered but we have not yet had any contact with the patient. Also there was some question about juxta lites for this patient we have not heard anything about that either. 8/28; patient presents for follow-up. She had ABIs completed on 8/23 that showed noncompressible ABIs on the right and left with TBI of 0.88 on the left and 0.96 on the right. She had triphasic waveforms throughout the arterial system. This is reassuring that she has adequate blood flow for healing. We have been using antibiotic ointment with Hydrofera Blue under Urgo K2 lite wrap. She has tolerated this well. Wound is smaller. 9/4; patient presents for follow-up. We have been using Urgo 2 regular4-layer equivalent as well as Hydrofera Blue and antibiotic ointment. Wound is slightly smaller. She has no issues or complaints. She tolerated the wrap well. 9/11; patient presents for follow-up. We have been using Hydrofera Blue with antibiotic ointment under Urgo regular compression. She states the wrap slid down on Sunday  and she has been keeping the area covered for the past 2 to 3 days. 9/18; small wound on the right lateral lower leg this is in the setting of significant lymphedema. We have you been using gent and mupirocin with Hydrofera Blue and Urgo K2 regular compression. The wound is about the same size this week. At 1 point she got wraparound stockings/external compression stockings during her time in our [place] sister clinic. I am not sure she is actually been wearing these in fact I think not 9/25; right lateral leg in the setting of very significant stage III lymphedema. We have been using gent and mupirocin under Hydrofera Blue and using an Urgo K2 lite. 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 She works in daycare is  on her feet a lot. She tells [place] a pipe burst in her apartment she got the wrap wet while cleaning up took it off 10/9; right lateral lower leg in the setting of stage III lymphedema. Small punched-out area but with a continuously nonviable surface. I changed to her to Iodoflex last week to help with the surface although she still comes in with a lot of fibrinous debris 10/16; right lateral lower leg wound in the setting of stage III lymphedema significant hemosiderin deposition but no obvious erythema. She arrives in clinic today with a much better looking wound surface. I started her on Iodoflex 2 weeks ago and did an aggressive debridement last week. The dimensions are slightly slightly larger today probably because of the debridement, however I think the surface of the wound is quite a bit better today. 10/23; comes in today with a large amount of sanguinous drainage. This is different for this patient. She  had no debridement last week. She says that there is some more pain than she is used to. She has not been systemically unwell. We have been using Iodoflex 10/30; patient had a wound infection last week which was new for her. Culture identified to have a growth of Citrobacter Koseri. It was pansensitive including to the doxycycline I gave her but still probably not the correct choice of antibiotic. She is still having drainage. The wound has improved but still does not look like what we had a few weeks ago. 09-26-2023 upon evaluation today patient appears to be doing well currently in regard to her wound. She has been tolerating the dressing changes without complication. Fortunately there does not appear to be any signs of infection and she is making excellent progress towards complete closure. With that being said there is some need for sharp debridement today and also believe that she probably needs to have a continuation of therapy with regard to her infection. I would actually put her on Levaquin for 2 weeks based on what I am seeing from a drainage standpoint. 10-03-2023 upon evaluation today patient appears to be doing well currently in regard to her wound. She has been tolerating the dressing changes without complication. Fortunately there does not appear to be any signs of infection at this time. 10-10-2023 upon evaluation today patient appears to be doing well currently in regard to her wound. She is actually tolerating the dressing changes without complication she is making excellent progress towards healing. Fortunately I am extremely pleased with where we stand today. 10-17-2023 upon evaluation today patient appears to be doing well currently in regard to her wound which is actually showing signs of excellent improvement. I do believe that she is making really good headway here and I am very pleased with where we stand I am hopeful that she will continue to show signs of good improvement  going forward. 11-02-2023 upon evaluation today patient appears to be doing well currently in regard to her wound. She is tolerating the dressing changes without complication. Fortunately there does not appear to be any signs of active infection at this time which is excellent news. Electronic Signature(s) Signed: 11/02/2023 9:27:33 AM By: Allen Derry PA-C Entered By: Allen Derry on 11/02/2023 06:27:33 -------------------------------------------------------------------------------- Physical Exam Details Patient Name: Date of Service: [name], Artemis [name]. 11/02/2023 8:45 A M Medical Record Number: [id number] Patient Account Number: [id number] Date of Birth/Sex: Treating RN: December 08, 1980 (42 y.o. [name] Primary Care Provider: PA [address] [place] Other Clinician: LEGACIE, DETORE ([id number]) [id number] Page  5 of 11 Referring Provider: Treating Provider/Extender: Allen Derry Self, Referral Weeks in Treatment: 39 Constitutional Well-nourished and well-hydrated in no acute distress. Respiratory normal breathing without difficulty. Psychiatric this patient is able to make decisions and demonstrates good insight into disease process. Alert and Oriented x 3. pleasant and cooperative. Notes Patient's wound did require some sharp debridement clearway necrotic debris. She tolerated debridement today without complication and postdebridement wound bed appears to be doing much better which is great news. There is just a very superficial stop the [employer] may appear in general. Electronic Signature(s) Signed: 11/02/2023 9:28:22 AM By: Allen Derry PA-C Entered By: Allen Derry on 11/02/2023 16:10:96 -------------------------------------------------------------------------------- Physician Orders Details Patient Name: Date of Service: [name], Shuntel [name]. 11/02/2023 8:45 A M Medical Record Number: [id number] Patient Account Number: [id number] Date of Birth/Sex: Treating  RN: [birth year] (42 y.o. [name] Primary Care Provider: PA Zenovia Jordan, [place] Other Clinician: Referring Provider: Treating Provider/Extender: Allen Derry Self, Referral Weeks in Treatment: 16 Verbal / Phone Orders: No Diagnosis Coding ICD-10 Coding Code Description [phone] Non-pressure chronic ulcer of other part of right lower leg with fat layer exposed I87.311 Chronic venous hypertension (idiopathic) with ulcer of right lower extremity I89.0 Lymphedema, not elsewhere classified L03.115 Cellulitis of right lower limb Follow-up Appointments Return Appointment in 1 week. Nurse Visit as needed Bathing/ Shower/ Hygiene May shower with wound dressing protected with water repellent cover or cast protector. No tub bath. Anesthetic (Use 'Patient Medications' Section for Anesthetic Order Entry) Lidocaine applied to wound bed Edema Control - Orders / Instructions Optional: One layer of unna paste to top of compression wrap (to act as an anchor). UrgoK2 - large Wound Treatment Wound #7 - Lower Leg Wound Laterality: Right, Lateral Cleanser: Soap and Water 1 x Per Week/30 Days Discharge Instructions: Gently cleanse wound with antibacterial soap, rinse and pat dry prior to dressing wounds Cleanser: Vashe 5.8 (oz) 1 x Per Week/30 Days [name], [name] ([id number]) [id number] Page 6 of 11 Discharge Instructions: Use vashe 5.8 (oz) as directed Prim Dressing: Hydrofera Blue Ready Transfer Foam, 2.5x2.5 (in/in) 1 x Per Week/30 Days ary Discharge Instructions: Apply Hydrofera Blue Ready to wound bed as directed Secondary Dressing: Zetuvit Plus 4x4 (in/in) 1 x Per Week/30 Days Compression Wrap: Urgo K2, two layer compression system, large 1 x Per Week/30 Days Electronic Signature(s) Signed: 11/02/2023 12:20:17 PM By: Midge Aver MSN RN CNS WTA Signed: 11/02/2023 12:55:12 PM By: Allen Derry PA-C Entered By: Midge Aver on 11/02/2023  06:29:00 -------------------------------------------------------------------------------- Problem List Details Patient Name: Date of Service: [name], [name] 11/02/2023 8:45 A M Medical Record Number: [id number] Patient Account Number: [id number] Date of Birth/Sex: Treating RN: 1981-06-04 (42 y.o. [name] Primary Care Provider: PA TIENT, NO Other Clinician: Referring Provider: Treating Provider/Extender: Allen Derry Self, Referral Weeks in Treatment: 18 Active Problems ICD-10 Encounter Code Description Active Date MDM Diagnosis L97.812 Non-pressure chronic ulcer of other part of right lower leg with fat layer 06/27/2023 No Yes exposed I87.311 Chronic venous hypertension (idiopathic) with ulcer of right lower extremity 06/27/2023 No Yes I89.0 Lymphedema, not elsewhere classified 06/27/2023 No Yes L03.115 Cellulitis of right lower limb 09/12/2023 No Yes Inactive Problems Resolved Problems Electronic Signature(s) Signed: 11/02/2023 12:20:17 PM By: Midge Aver MSN RN CNS WTA Signed: 11/02/2023 12:55:12 PM By: Allen Derry PA-C Previous Signature: 11/02/2023 9:14:13 AM Version By: Allen Derry PA-C Entered By: Midge Aver on 11/02/2023 06:40:53 [name], [name] ([id number]) [id number] Page 7 of 11 -------------------------------------------------------------------------------- Progress Note Details Patient  Name: Date of Service: [name], [name] 11/02/2023 8:45 A M Medical Record Number: [id number] Patient Account Number: [id number] Date of Birth/Sex: Treating RN: 09/05/1981 (42 y.o. [name] Primary Care Provider: PA TIENT, NO Other Clinician: Referring Provider: Treating Provider/Extender: Allen Derry Self, Referral Weeks in Treatment: 18 Subjective Chief Complaint Information obtained from Patient 06/27/2023; right lower extremity wound History of Present Illness (HPI) 42 year old patient well known to our [facility] wound care clinic where  she has been seen since 2016 for bilateral lower extremity venous insufficiency disease with lymphedema and multiple ulcerations associated with morbid obesity. she had custom-made compression stockings and lymphedema pumps which were used in the past. most recently she was admitted to the hospital between October 11 and 09/02/2017 with sepsis, lower extremity wounds and lymphedema.she was initially treated in the outpatient with Keflex and Bactrim. she was initially treated in the hospital with vancomycin and Zosyn and changed over to Unasyn until her white count improved and her blood cultures were negative for 3 days. After her inpatient management she was discharged home on Augmentin to end on 09/13/2017 with a 14 day course. she has had outpatient vascular duplex scans completed in November 2017 and her right ABI was 1.1 and the left ABI is 1.3. she had normal toe brachial indices bilaterally.she had three-vessel runoff in the right lower extremity and two-vessel runoff in the left lower extremity. On questioning the patient she does have custom made compression stockings and also has a lymphedema pump but has not been using it appropriately and has not been taking good care of herself. 09/17/2017 -- she returns today with compression stockings on the left side and the right side has had significant amount of drainage and has a very strong odor 09/24/2017 -- the drainage is increased significantly and she has more lymphedema and a very strong odor to her wound. Though she does not have systemic symptoms, or overt infection I believe she will benefit from some doxycycline given empirically. 10/01/2017 -- after starting the doxycycline and changing the dressing twice a week her symptoms and signs have definitely improved overall. 10/08/2017 -- she has completed her course of doxycycline but continues to have a lot of drainage and needs twice a week dressing changes. 11/08/17-she is here in  follow-up evaluation for right lower extremity ulcers. She admits to using her lymphedema pumps twice daily, one hour per session. she is voicing no complaints or concerns, no signs of infection will change to [facility] 12/14/17 on evaluation today patient appears to be doing very well in regard to her wounds. She has been tolerating the dressing changes she continues to develop some portly the adherent granular tissue on the surface of the wound with some Slough. Obviously we are trying to get too much better wound bed. With that being said the hyper granulation the Hydrofera Blue Dressing to have helped with which is excellent news. However I think it may be time to try something a little bit different at this point. 01/11/18 on evaluation today patient appears to be doing fairly well in regard to her right lateral lower extremity ulcers. This shows excellent signs of filling in which is great news. There does not appear to be any evidence of infection which is also good news. She does continue to work as well is good school. She is having no pain. 01/22/18 on evaluation today patient appears to be doing a little bit worse in my opinion in regard to the overall quality of  that granulation on her right lower extremity. She was not here last week due to being sick with a stomach virus this may have something to do with the fact that her wound appears to be a little bit worse. With that being said I'm also thinking that after switching from the [facility] Dressing to the silver collagen would really has not looked that's good in my opinion. We may want to swit 02/11/18 on evaluation today patient's right lower/lateral lower extremity ulcers appear to be doing very well at this point. Especially the more proximal ulcer has filled in much closer to surface which is good news. Nonetheless both show signs of improvement which is great news. There does not appear to be any evidence of infection which is also  good news. In general patient has been doing well tolerating the wraps as well as the [employer]. 02/18/18 on evaluation today patient appears to be doing a little bit worse in regard to the periwound region the wounds themselves do not look much deteriorated to me. With that being said she has several small blisters/pustules noted in the periwound and there was a significant amount of drainage and maceration compared to previous. There has been a time that we had to bring her back for twice a week dressing changes as far as her wrap was concerned it has been a while since we've done that however. With that being said the patient has been having some burning and in general I'm concerned about the possibility of infection. She has previously taken doxycycline with good result. Fortunately there does not appear to be any evidence of overall worsening in regard to the size of the wound and in fact the upper wound actually appears to be showing signs of good epithelialization. 03/18/18 on evaluation today patient appears to be doing excellent in regard to her right lateral lower extremity ulcer. She has been tolerating the dressing changes without complication. Fortunately this seems to be making great progress. Overall I see no signs of infection and there is dramatic improvement overall even compared to last week. 03/28/18 on evaluation today patient appears to be doing very well in regard to her right lateral lower surety ulcer. She has been tolerating the dressing changes without complication at this point. She states currently that she's having no significant discomfort which is excellent as well. Overall I'm pleased with how things seem to be progressing. [name], [name] ([id number]) [id number] Page 8 of 11 04/01/18 on evaluation today patient appears to be doing excellent in regard to her right lateral lower extremity ulcers. She has been tolerating the  dressing changes without complication which is good news. With that being said the wraps still continues to show signs of helping with her fluid she does have Juxta-Lite compression stockings for when we are done with the current treatment regimen once everything heals. Mainly she just has the one area still remaining the smaller of the two wings is pretty much closed at this point there's just a very slight opening noted. 04/08/18 on evaluation today patient appears to be doing better in regard to the original wound on the right lateral lower extremity that we have been managing. Unfortunately she has a new area of weeping more anterior on the right lateral lower extremity and on the left lower extremity she has two new ulcers there appears to be some cellulitis noted at this time. I am concerned about the fact that this may in fact be an infection  that has caused the worsening and swelling in the past this has been the case when we previously attempted to determine what was going on when she had down slides like this. With that being said the patient is seeming to tolerate the wraps fairly well for the most part. 04/17/18; since last time the patient was seen in this clinic she was hospitalized from 04/11/18 through 04/13/18; she presented with bilateral lower extremity pain worse on the right and a fever of up to 104. Noteworthy that when she was in the clinic last week she had new wounds on the left leg culture grew MRSA and she was prescribed Bactrim. She had 2 days of IV Vanco and Zosyn in the hospital. Her blood cultures were negative. She was discharged on Bactrim to cover the original MRSA on the right leg and Keflex to cover the possibility of strep. The hospitalist had a conversation with infectious disease. The patient arrives in clinic today for nurse check however given the recent hospitalization I was asked to look at her. The patient states she feels a lot better. No fever or chills. Still  some pain in the right calf but a lot better. She arrived in the hospital with a white count of 15.6, the next day was 10.8. Comprehensive metabolic panel was normal. She is still taking Keflex and Bactrim It would appear that she had a surgical IandD at the bedside of the right calf felt to have a underlying abscess. According to our intake nurse the wound has expanded quite bit on the right lateral calf. She has no open area on the left anterior and left posterior calf as described last time 04/23/18 on evaluation today patient actually appears to be doing much better than when I last saw her. This obviously has been a couple weeks ago and in the interim she was admitted to the hospital for IV antibiotic therapy due to cellulitis, discharge, and fortunately the substance which hadn't sued has completely resolved. Her swelling seems to be better in regard to her lower extremities as well and the wounds that opened up as results of the infection seems to be showing signs of improvement. There is some Slough noted on the left lateral wound otherwise a lot of weeping noted of the right lateral leg. 04/29/18 on evaluation today patient appears to be doing excellent in regard to her bilateral lower extremity ulcers. She's been tolerating the dressing changes without complication there does not appear to be any evidence of infection at this time. Overall I think she is made great improvement and seems to be showing signs of coming back around where she was prior to the cellulitis and sepsis episode. 05/06/18 on evaluation today patient appears to be doing better in regard to her bilateral ocean the ulcers. She's been tolerating the dressing changes without complication. Fortunately there does not appear to be any evidence of infection at this time. Her swelling is significantly down overall seems to be showing signs of good improvement as well. Fortunately I do believe that she is progressing nicely back to  where she was prior to the cellulitis/sepsis issue. 05/13/18 on evaluation today patient seems to be showing signs of great improvement she's been tolerating the dressing changes without complication and overall there does not appear to be any evidence of infection. All of which is good news. The only issue she really have today is that some of the [facility] Dressing actually was stuck to the periwound of the right  lateral lower extremity however this was able to be removed during the debridement without complication. 05/21/18 on evaluation today patient appears to be doing very well in regard to her bilateral lower extremity ulcers. She has been tolerating the compression wraps without complication. There does not appear to be any evidence of infection at this point which is good news as well. Overall I'm very pleased with the progress that has been made. She likewise is very happy. 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 05/28/18 on evaluation today patient actually appears to be doing excellent in regard to her bilateral lower [place] ulcers. She's made great progress since last week and overall I'm very pleased in this regard. She states that she's not have any discomfort either which is also good news there's definitely no evidence of infection. 06/03/18 on evaluation today patient actually appears to be doing excellent in regard to her ulcerations in fact these areas on both lower extremities were almost completely healed. Unfortunately she has a situation going on her life right now she actually found her husband deceased last 11-Dec-2023. She states that she has been quite a bit upset since that time unfortunately this is obviously a very big blow to her. Nonetheless she is concerned about the funeral and having to wear certain shoes for this that she states she cannot fit in with  the wraps. She wonders if she could potentially come back on 12/11/23 to have her wraps changed and to switch into her Juxta-Lite compression at that point. I think that would be an okay thing that we could do for her. Nonetheless she fortunately seems to be tolerating the wraps very well and again has made excellent progress with the [facility] Dressing 06/11/18 on evaluation today patient appears to be doing rather well in regard to her bilateral lower extremities in fact everything appears to be completely healed at this point which is excellent news. There does not appear to be any evidence of infection at this time which is great. In fact overall I do believe she is completely resolved in regard to her bilateral lower extremity ulcers. She is extremely happy in this regard. 06/27/2023 Ms. [name] is a 42 year old female with a past medical history of venous insufficiency and lymphedema that presents to the clinic for a 3-week history of nonhealing wound to the right lower extremity. She states this started spontaneously. She does not wear compression stockings. She has been keeping the area covered. She currently denies signs of infection. 8/14; patient readmitted to clinic last week. She has venous insufficiency and chronic secondary lymphedema. She has a nonhealing wound on the right lateral lower leg. She has been using gent mupirocin and Hydrofera Blue using an Urgo K2 lite wrap. Her ABI on the right leg was 0.74 on admission to clinic. Formal arterial studies were ordered but we have not yet had any contact with the patient. Also there was some question about juxta lites for this patient we have not heard anything about that either. 8/28; patient presents for follow-up. She had ABIs completed on 8/23 that showed noncompressible ABIs on the right and left with TBI of 0.88 on the left and 0.96 on the right. She had triphasic waveforms throughout the arterial system. This is reassuring that  she has adequate blood flow for healing. We have been using antibiotic ointment with Hydrofera Blue under Urgo K2 lite  wrap. She has tolerated this well. Wound is smaller. 9/4; patient presents for follow-up. We have been using Urgo 2 regular4-layer equivalent as well as Hydrofera Blue and antibiotic ointment. Wound is slightly smaller. She has no issues or complaints. She tolerated the wrap well. 9/11; patient presents for follow-up. We have been using Hydrofera Blue with antibiotic ointment under Urgo regular compression. She states the wrap slid down on Sunday and she has been keeping the area covered for the past 2 to 3 days. 9/18; small wound on the right lateral lower leg this is in the setting of significant lymphedema. We have you been using gent and mupirocin with Hydrofera Blue and Urgo K2 regular compression. The wound is about the same size this week. At 1 point she got wraparound stockings/external compression stockings during her time in our [place] [facility] clinic. I am not sure she is actually been wearing these in fact I think not 9/25; right lateral leg in the setting of very significant stage III lymphedema. We have been using gent and mupirocin under Hydrofera Blue and using an Urgo K2 lite. She works in a daycare 10/2; right lateral leg in the setting of stage III lymphedema which is marginally controlled and spite of 4-layer equivalent compression with Harlene Salts we have [name], [name] ([id number]) [id number] Page 9 of 11 been using gent and mupirocin with Hydrofera Blue, not making much progress however in the small circular wound with a very fibrinous surface. 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 She tells [place] a pipe burst in her apartment she got the wrap wet while cleaning up took it off 10/9; right lateral lower leg in the setting of stage III lymphedema. Small punched-out area but with a continuously nonviable surface. I changed to her  to Iodoflex last week to help with the surface although she still comes in with a lot of fibrinous debris 10/16; right lateral lower leg wound in the setting of stage III lymphedema significant hemosiderin deposition but no obvious erythema. She arrives in clinic today with a much better looking wound surface. I started her on Iodoflex 2 weeks ago and did an aggressive debridement last week. The dimensions are slightly slightly larger today probably because of the debridement, however I think the surface of the wound is quite a bit better today. 10/23; comes in today with a large amount of sanguinous drainage. This is different for this patient. She had no debridement last week. She says that there is some more pain than she is used to. She has not been systemically unwell. We have been using Iodoflex 10/30; patient had a wound infection last week which was new for her. Culture identified to have a growth of Citrobacter Koseri. It was pansensitive including to the doxycycline I gave her but still probably not the correct choice of antibiotic. She is still having drainage. The wound has improved but still does not look like what we had a few weeks ago. 09-26-2023 upon evaluation today patient appears to be doing well currently in regard to her wound. She has been tolerating the dressing changes without complication. Fortunately there does not appear to be any signs of infection and she is making excellent progress towards complete closure. With that being said there is some need for sharp debridement today and also believe that she probably needs to have a continuation of therapy with regard to her infection. I would actually put her on Levaquin for 2 weeks based on what I  am seeing from a drainage standpoint. 10-03-2023 upon evaluation today patient appears to be doing well currently in regard to her wound. She has been tolerating the dressing changes without complication. Fortunately there does not  appear to be any signs of infection at this time. 10-10-2023 upon evaluation today patient appears to be doing well currently in regard to her wound. She is actually tolerating the dressing changes without complication she is making excellent progress towards healing. Fortunately I am extremely pleased with where we stand today. 10-17-2023 upon evaluation today patient appears to be doing well currently in regard to her wound which is actually showing signs of excellent improvement. I do believe that she is making really good headway here and I am very pleased with where we stand I am hopeful that she will continue to show signs of good improvement going forward. 11-02-2023 upon evaluation today patient appears to be doing well currently in regard to her wound. She is tolerating the dressing changes without complication. Fortunately there does not appear to be any signs of active infection at this time which is excellent news. Objective Constitutional Well-nourished and well-hydrated in no acute distress. Vitals Time Taken: 9:06 AM, Height: 73 in, Weight: 436 lbs, BMI: 57.5, Temperature: 98.5 F, Pulse: 82 bpm, Respiratory Rate: 18 breaths/min, Blood Pressure: 117/95 mmHg. Respiratory normal breathing without difficulty. Psychiatric this patient is able to make decisions and demonstrates good insight into disease process. Alert and Oriented x 3. pleasant and cooperative. General Notes: Patient's wound did require some sharp debridement clearway necrotic debris. She tolerated debridement today without complication and postdebridement wound bed appears to be doing much better which is great news. There is just a very superficial stop the HydraWear may appear in general. Integumentary (Hair, Skin) Wound #7 status is Open. Original cause of wound was Gradually Appeared. The date acquired was: 06/06/2023. The wound has been in treatment 18 weeks. The wound is located on the Right,Lateral Lower Leg. The  wound measures 0.2cm length x 0.3cm width x 0.1cm depth; 0.047cm^2 area and 0.005cm^3 volume. There is Fat Layer (Subcutaneous Tissue) exposed. There is a medium amount of serosanguineous drainage noted. There is small (1-33%) red, pink granulation within the wound bed. There is a medium (34-66%) amount of necrotic tissue within the wound bed including Adherent Slough. Assessment Active Problems ICD-10 Non-pressure chronic ulcer of other part of right lower leg with fat layer exposed Chronic venous hypertension (idiopathic) with ulcer of right lower extremity Lymphedema, not elsewhere classified Cellulitis of right lower limb Procedures Chamblin, [name] ([id number]) [id number] Page 10 of 11 Wound #7 Pre-procedure diagnosis of Wound #7 is a Venous Leg Ulcer located on the Right,Lateral Lower Leg .Severity of Tissue Pre Debridement is: Fat layer exposed. There was a Selective/Open Wound Skin/Epidermis Debridement with a total area of 0.05 sq cm performed by Allen Derry, PA-C. With the following instrument(s): Curette to remove Non-Viable tissue/material. Material removed includes Skin: Dermis and Skin: Epidermis and after achieving pain control using Lidocaine 4% T opical Solution. No specimens were taken. A time out was conducted at 09:23, prior to the start of the procedure. A Minimum amount of bleeding was controlled with Pressure. The procedure was tolerated well with a pain level of 0 throughout and a pain level of 0 following the procedure. Post Debridement Measurements: 0.2cm length x 0.3cm width x 0.1cm depth; 0.005cm^3 volume. Character of Wound/Ulcer Post Debridement is stable. Severity of Tissue Post Debridement is: Fat layer exposed. 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 There was a Four Layer Compression Therapy Procedure by Midge Aver,  RN. Post procedure Diagnosis Wound #7: Same as Pre-Procedure Plan 1. Based on what I am seeing I do believe that the patient is making good headway here towards closure there was just a very superficial debridement performed today postdebridement this looks to be doing much better. 2. I am going to recommend we continue with the [employer] and the compression wrap currently. 3. And also can recommend the patient should continue to elevate her legs is much as possible. We will see patient back for reevaluation in 1 week here in the clinic. If anything worsens or changes patient will contact our office for additional recommendations. Electronic Signature(s) Signed: 11/02/2023 9:28:52 AM By: Allen Derry PA-C Entered By: Allen Derry on 11/02/2023 54:09:81 -------------------------------------------------------------------------------- SuperBill Details Patient Name: Date of Service: [name], Gera [name]. 11/02/2023 Medical Record Number: [id number] Patient Account Number: [id number] Date of Birth/Sex: Treating RN: 06-Nov-1981 (42 y.o. [name] Primary Care Provider: PA Zenovia Jordan, [place] Other Clinician: Referring Provider: Treating Provider/Extender: Allen Derry Self, Referral Weeks in Treatment: 18 Diagnosis Coding ICD-10 Codes Code Description [phone] Non-pressure chronic ulcer of other part of right lower leg with fat layer exposed I87.311 Chronic venous hypertension (idiopathic) with ulcer of right lower extremity I89.0 Lymphedema, not elsewhere classified L03.115 Cellulitis of right lower limb Facility Procedures : CPT4 Code: [id number] Description: 97597 - DEBRIDE WOUND 1ST 20 SQ CM OR < ICD-10 Diagnosis Description L97.812 Non-pressure chronic ulcer of other part of right lower leg with fat layer exposed Modifier: Quantity: 1 Physician Procedures : CPT4 Code Description ROBIE, ECCLESTON [name] ([id number]) [id number] Page [id number] 97597 - WC PHYS DEBR WO  ANESTH 20 SQ CM 1 ICD-10 Diagnosis Description L97.812 Non-pressure chronic ulcer of other part of right lower leg  with fat layer exposed Quantity: 11 of 11 Electronic Signature(s) Signed: 11/02/2023 12:20:17 PM By: Midge Aver MSN RN CNS WTA Signed: 11/02/2023 12:55:12 PM By: Allen Derry PA-C Previous Signature: 11/02/2023 9:29:01 AM Version By: Allen Derry PA-C Entered By: Midge Aver on 11/02/2023 06:37:44

## 2023-11-09 ENCOUNTER — Encounter: Payer: Self-pay | Admitting: Physician Assistant

## 2023-11-10 NOTE — Progress Notes (Signed)
Jessica, DREESSEN [name] ([id number]) [id number] Page 1 of 8 Visit Report for 11/09/2023 Arrival Information Details Patient Name: Date of Service: [name], [name] 11/09/2023 8:45 A M Medical Record Number: [id number] Patient Account Number: [id number] Date of Birth/Sex: Treating RN: 1981-11-11 (42 y.o. [name]: PA [name], NO Other Clinician: Referring [name]: Treating [name]/Extender: [name] Self, Referral Weeks in Treatment: 70 Visit Information History Since Last Visit Added or deleted any medications: No Patient Arrived: Ambulatory Any new allergies or adverse reactions: No Arrival Time: 08:56 Had a fall or experienced change in No Accompanied By: self activities of daily living that may affect Transfer Assistance: None risk of falls: Patient Identification Verified: Yes Hospitalized since last visit: No Secondary Verification Process Completed: Yes Has Dressing in Place as Prescribed: Yes Patient Requires Transmission-Based Precautions: No Has Compression in Place as Prescribed: Yes Patient Has Alerts: No Pain Present Now: No Electronic Signature(s) Signed: 11/09/2023 12:01:12 PM By: Midge Aver MSN RN CNS WTA Entered By: Midge Aver on 11/09/2023 05:57:32 -------------------------------------------------------------------------------- Clinic Level of Care Assessment Details Patient Name: Date of Service: [name] 11/09/2023 8:45 A M Medical Record Number: [id number] Patient Account Number: [id number] Date of Birth/Sex: Treating RN: 09-May-1981 (42 y.o. [name] Primary Care [name]: PA [name], NO Other Clinician: Referring [name]: Treating [name]/Extender: [name] Self, Referral Weeks in Treatment: 19 Clinic Level of Care Assessment Items TOOL 4 Quantity Score X- 1 0 Use when only an EandM is performed on FOLLOW-UP visit ASSESSMENTS - Nursing Assessment / Reassessment X- 1  10 Reassessment of Co-morbidities (includes updates in patient status) X- 1 5 Reassessment of Adherence to Treatment Plan ASSESSMENTS - Wound and Skin A ssessment / Reassessment X - Simple Wound Assessment / Reassessment - one wound 1 5 [name] ([id number]) [id number] Page 2 of 8 []  - 0 Complex Wound Assessment / Reassessment - multiple wounds []  - 0 Dermatologic / Skin Assessment (not related to wound area) ASSESSMENTS - Focused Assessment []  - 0 Circumferential Edema Measurements - multi extremities []  - 0 Nutritional Assessment / Counseling / Intervention []  - 0 Lower Extremity Assessment (monofilament, tuning fork, pulses) []  - 0 Peripheral Arterial Disease Assessment (using hand held doppler) ASSESSMENTS - Ostomy and/or Continence Assessment and Care []  - 0 Incontinence Assessment and Management []  - 0 Ostomy Care Assessment and Management (repouching, etc.) PROCESS - Coordination of Care X - Simple Patient / Family Education for ongoing care 1 15 []  - 0 Complex (extensive) Patient / Family Education for ongoing care X- 1 10 Staff obtains Chiropractor, Records, T Results / Process Orders est []  - 0 Staff telephones HHA, Nursing Homes / Clarify orders / etc []  - 0 Routine Transfer to another Facility (non-emergent condition) []  - 0 Routine Hospital Admission (non-emergent condition) []  - 0 New Admissions / Manufacturing engineer / Ordering NPWT Apligraf, etc. , []  - 0 Emergency Hospital Admission (emergent condition) X- 1 10 Simple Discharge Coordination []  - 0 Complex (extensive) Discharge Coordination PROCESS - Special Needs []  - 0 Pediatric / Minor Patient Management []  - 0 Isolation Patient Management []  - 0 Hearing / Language / Visual special needs []  - 0 Assessment of Community assistance (transportation, D/C planning, etc.) []  - 0 Additional assistance / Altered mentation []  - 0 Support Surface(s) Assessment (bed,  cushion, seat, etc.) INTERVENTIONS - Wound Cleansing / Measurement X - Simple Wound Cleansing - one wound 1 5 []  - 0 Complex Wound Cleansing - multiple wounds X-  1 5 Wound Imaging (photographs - any number of wounds) []  - 0 Wound Tracing (instead of photographs) X- 1 5 Simple Wound Measurement - one wound []  - 0 Complex Wound Measurement - multiple wounds INTERVENTIONS - Wound Dressings X - Small Wound Dressing one or multiple wounds 1 10 []  - 0 Medium Wound Dressing one or multiple wounds []  - 0 Large Wound Dressing one or multiple wounds []  - 0 Application of Medications - topical []  - 0 Application of Medications - injection INTERVENTIONS - Miscellaneous []  - 0 External ear exam []  - 0 Specimen Collection (cultures, biopsies, blood, body fluids, etc.) []  - 0 Specimen(s) / Culture(s) sent or taken to Lab for analysis [name] ([id number]) [id number] Page 3 of 8 []  - 0 Patient Transfer (multiple staff / Nurse, adult / Similar devices) []  - 0 Simple Staple / Suture removal (25 or less) []  - 0 Complex Staple / Suture removal (26 or more) []  - 0 Hypo / Hyperglycemic Management (close monitor of Blood Glucose) []  - 0 Ankle / Brachial Index (ABI) - do not check if billed separately X- 1 5 Vital Signs Has the patient been seen at the hospital within the last three years: Yes Total Score: 85 Level Of Care: New/Established - Level 3 Electronic Signature(s) Signed: 11/09/2023 12:01:12 PM By: Midge Aver MSN RN CNS WTA Entered By: Midge Aver on 11/09/2023 06:36:19 -------------------------------------------------------------------------------- Encounter Discharge Information Details Patient Name: Date of Service: [name], [name] 11/09/2023 8:45 A M Medical Record Number: [id number] Patient Account Number: [id number] Date of Birth/Sex: Treating RN: 1981-01-13 (42 y.o. [name] Primary Care [name]: PA [name], NO Other  Clinician: Referring [name]: Treating [name]/Extender: [name] Self, Referral Weeks in Treatment: 65 Encounter Discharge Information Items Discharge Condition: Stable Ambulatory Status: Cane Discharge Destination: Home Transportation: Private Auto Accompanied By: self Schedule Follow-up Appointment: Yes Clinical Summary of Care: Electronic Signature(s) Signed: 11/09/2023 9:43:25 AM By: Midge Aver MSN RN CNS WTA Entered By: Midge Aver on 11/09/2023 06:43:25 -------------------------------------------------------------------------------- Lower Extremity Assessment Details Patient Name: Date of Service: [name], [name] 11/09/2023 8:45 A M Medical Record Number: [id number] Patient Account Number: [id number] Date of Birth/Sex: Treating RN: 1981/07/05 (42 y.o. [name] Primary Care Leevi Cullars: PA [address], [place] Other Clinician: RINDI, MENESES ([id number]) [id number] Page 4 of 8 Referring Camron Monday: Treating Cambre Matson/Extender: [name] Self, Referral Weeks in Treatment: 19 Edema Assessment Assessed: [Left: No] [Right: Yes] [Left: Edema] [Right: :] Calf Left: Right: Point of Measurement: 38 cm From Medial Instep 57 cm Ankle Left: Right: Point of Measurement: 13 cm From Medial Instep 34 cm Vascular Assessment Pulses: Dorsalis Pedis Palpable: [Right:Yes] Extremity colors, hair growth, and conditions: Extremity Color: [Right:Hyperpigmented] Hair Growth on Extremity: [Right:No] Temperature of Extremity: [Right:Warm] Capillary Refill: [Right:< 3 seconds] Dependent Rubor: [Right:No] Blanched when Elevated: [Right:No No] Toe Nail Assessment Left: Right: Thick: Yes Discolored: Yes Deformed: Yes Improper Length and Hygiene: Yes Electronic Signature(s) Signed: 11/09/2023 12:01:12 PM By: Midge Aver MSN RN CNS WTA Entered By: Midge Aver on 11/09/2023  06:25:53 -------------------------------------------------------------------------------- Multi Wound Chart Details Patient Name: Date of Service: [name], [name] 11/09/2023 8:45 A M Medical Record Number: [id number] Patient Account Number: [id number] Date of Birth/Sex: Treating RN: 21-Apr-1981 (42 y.o. [name] Primary Care Arabelle Bollig: PA [name], NO Other Clinician: Referring Samaria Anes: Treating Allyanna Appleman/Extender: [name] Self, Referral Weeks in Treatment: 19 Vital Signs Height(in): 73 Pulse(bpm): 72 Weight(lbs): 436 Blood Pressure(mmHg): 133/95 Body Mass Index(BMI): 57.5 Temperature(F): 98.0 Respiratory Rate(breaths/min): 18  REXANN, MARCHESANI [name] ([id number]) [7:Photos:] [N/A:N/A] Right, Lateral Lower Leg N/A N/A Wound Location: Gradually Appeared N/A N/A Wounding Event: Venous Leg Ulcer N/A N/A Primary Etiology: Lymphedema, Hypertension N/A N/A Comorbid History: 06/06/2023 N/A N/A Date Acquired: 70 N/A N/A Weeks of Treatment: Healed - Epithelialized N/A N/A Wound Status: No N/A N/A Wound Recurrence: 0x0x0 N/A N/A Measurements L x W x D (cm) 0 N/A N/A A (cm) : rea 0 N/A N/A Volume (cm) : 100.00% N/A N/A % Reduction in A rea: 100.00% N/A N/A % Reduction in Volume: Full Thickness Without Exposed N/A N/A Classification: Support Structures Medium N/A N/A Exudate Amount: Serosanguineous N/A N/A Exudate Type: red, brown N/A N/A Exudate Color: Small (1-33%) N/A N/A Granulation Amount: Red, Pink N/A N/A Granulation Quality: Medium (34-66%) [name]: Fat Layer (Subcutaneous Tissue): Yes N/A N/A Exposed Structures: Fascia: No Tendon: No Muscle: No Joint: No Bone: No None N/A N/A Epithelialization: Treatment Notes Electronic Signature(s) Signed: 11/09/2023 9:34:41 AM By: Midge Aver MSN RN CNS WTA Entered By: Midge Aver on 11/09/2023  06:34:41 -------------------------------------------------------------------------------- Multi-Disciplinary Care Plan Details Patient Name: Date of Service: [facility], Thressa [name]. 11/09/2023 8:45 A M Medical Record Number: [id number] Patient Account Number: [id number] Date of Birth/Sex: Treating RN: 1981/10/22 (42 y.o. [name] Primary Care Nayef [employer]: PA [name], [place] Other Clinician: Referring Mazey Mantell: Treating Lana Flaim/Extender: [name] Self, Referral Weeks in Treatment: 41 Active Inactive Electronic Signature(s) Signed: 11/09/2023 9:39:01 AM By: Midge Aver MSN RN CNS WTA Entered By: Midge Aver on 11/09/2023 06:39:01 [name] [name] ([id number]) [id number] Page 6 of 8 -------------------------------------------------------------------------------- Pain Assessment Details Patient Name: Date of Service: EMMALYN, COWDIN. 11/09/2023 8:45 A M Medical Record Number: [id number] Patient Account Number: [id number] Date of Birth/Sex: Treating RN: 1981/02/02 (42 y.o. [name] Primary Care Denetra Formoso: PA [name], [place] Other Clinician: Referring Itali Mckendry: Treating Davyn Elsasser/Extender: [name] Self, Referral Weeks in Treatment: 19 Active Problems Location of Pain Severity and Description of Pain Patient Has Paino No Site Locations Pain Management and Medication Current Pain Management: Electronic Signature(s) Signed: 11/09/2023 12:01:12 PM By: Midge Aver MSN RN CNS WTA Entered By: Midge Aver on 11/09/2023 05:58:25 -------------------------------------------------------------------------------- Patient/Caregiver Education Details Patient Name: Date of Service: [name], [name] 12/20/2024andnbsp8:45 A M Medical Record Number: [id number] Patient Account Number: [id number] Date of Birth/Gender: Treating RN: 03/03/1981 (42 y.o. [name] Primary Care Physician: PA [name], [place] Other Clinician: Referring Physician: Treating Physician/Extender: [name] Self, Referral Weeks in Treatment: [address], Audry [name] ([id number]) [id number] Page 7 of 8 Education Assessment Education Provided To: Patient Education Topics Provided Wound/Skin Impairment: Handouts: Caring for Your Ulcer Methods: Explain/Verbal Responses: State content correctly Electronic Signature(s) Signed: 11/09/2023 12:01:12 PM By: Midge Aver MSN RN CNS WTA Entered By: Midge Aver on 11/09/2023 06:40:05 -------------------------------------------------------------------------------- Wound Assessment Details Patient Name: Date of Service: [name], [name] 11/09/2023 8:45 A M Medical Record Number: [id number] Patient Account Number: [id number] Date of Birth/Sex: Treating RN: [birth year] (42 y.o. [name] Primary Care Anael Rosch: PA [name], [place] Other Clinician: Referring Munir Victorian: Treating Lyall Faciane/Extender: [name] Self, Referral Weeks in Treatment: 19 Wound Status Wound Number: 7 Primary Etiology: Venous Leg Ulcer Wound Location: Right, Lateral Lower Leg Wound Status: Healed - Epithelialized Wounding Event: Gradually Appeared Comorbid History: Lymphedema, Hypertension Date Acquired: 06/06/2023 Weeks Of Treatment: 19 Clustered Wound: No Photos Wound Measurements Length: (cm) Width: (cm) Depth: (cm) Area: (cm) Volume: (cm) 0 % Reduction in Area: 100% 0 % Reduction in Volume: 100% 0 Epithelialization: None  0 0 Wound Description Classification: Full Thickness Without Exposed Support Structures Exudate Amount: Medium Exudate Type: Serosanguineous Gow, Laquan [name] ([id number]) Exudate Color: red, brown Foul Odor After Cleansing: No Slough/Fibrino Yes [id number] Page 8 of 8 Wound Bed Granulation Amount: Small (1-33%) Exposed Structure Granulation Quality: Red, Pink Fascia Exposed: No Necrotic Amount: Medium (34-66%) Fat Layer (Subcutaneous Tissue) Exposed: Yes Tendon Exposed: No Muscle Exposed:  No Joint Exposed: No Bone Exposed: No Treatment Notes Wound #7 (Lower Leg) Wound Laterality: Right, Lateral Cleanser Peri-Wound Care Topical Primary Dressing Secondary Dressing Secured With Compression Wrap Compression Stockings Add-Ons Electronic Signature(s) Signed: 11/09/2023 12:01:12 PM By: Midge Aver MSN RN CNS WTA Entered By: Midge Aver on 11/09/2023 06:25:48 -------------------------------------------------------------------------------- Vitals Details Patient Name: Date of Service: [name], [name] 11/09/2023 8:45 A M Medical Record Number: [id number] Patient Account Number: [id number] Date of Birth/Sex: Treating RN: 08/29/1981 (42 y.o. [name] Primary Care Debria Broecker: PA [name], [place] Other Clinician: Referring Jaxston Chohan: Treating Melaysia Streed/Extender: [name] Self, Referral Weeks in Treatment: 19 Vital Signs Time Taken: 08:57 Temperature (F): 98.0 Height (in): 73 Pulse (bpm): 72 Weight (lbs): 436 Respiratory Rate (breaths/min): 18 Body Mass Index (BMI): 57.5 Blood Pressure (mmHg): 133/95 Reference Range: 80 - 120 mg / dl Electronic Signature(s) Signed: 11/09/2023 12:01:12 PM By: Midge Aver MSN RN CNS WTA Entered By: Midge Aver on 11/09/2023 05:58:19

## 2023-11-10 NOTE — Progress Notes (Addendum)
[name], [name] ([id number]) [id number] Page 1 of 9 Visit Report for 11/09/2023 Chief Complaint Document Details Patient Name: Date of Service: ADRIELLA, KINSLER. 11/09/2023 8:45 A M Medical Record Number: [id number] Patient Account Number: [id number] Date of Birth/Sex: Treating RN: 1981-07-03 (42 y.o. [name] Primary Care Provider: PA Zenovia Jordan, [place] Other Clinician: Referring Provider: Treating Provider/Extender: Allen Derry Self, Referral Weeks in Treatment: 19 Information Obtained from: Patient Chief Complaint 06/27/2023; right lower extremity wound Electronic Signature(s) Signed: 11/09/2023 9:02:13 AM By: Allen Derry PA-C Entered By: Allen Derry on 11/09/2023 06:02:13 -------------------------------------------------------------------------------- HPI Details Patient Name: Date of Service: [name], [name] 11/09/2023 8:45 A M Medical Record Number: [id number] Patient Account Number: [id number] Date of Birth/Sex: Treating RN: [birth year] (43 y.o. [name] Primary Care Provider: PA Zenovia Jordan, NO Other Clinician: Referring Provider: Treating Provider/Extender: Allen Derry Self, Referral Weeks in Treatment: 58 History of Present Illness HPI Description: 42 year old patient well known to our [facility] wound care clinic where she has been seen since 2016 for bilateral lower extremity venous insufficiency disease with lymphedema and multiple ulcerations associated with morbid obesity. she had custom-made compression stockings and lymphedema pumps which were used in the past. most recently she was admitted to the hospital between October 11 and 09/02/2017 with sepsis, lower extremity wounds and lymphedema.she was initially treated in the outpatient with Keflex and Bactrim. she was initially treated in the hospital with vancomycin and Zosyn and changed over to Unasyn until her white count improved and her blood cultures were negative for 3 days. After her  inpatient management she was discharged home on Augmentin to end on 09/13/2017 with a 14 day course. she has had outpatient vascular duplex scans completed in November 2017 and her right ABI was 1.1 and the left ABI is 1.3. she had normal toe brachial indices bilaterally.she had three-vessel runoff in the right lower extremity and two-vessel runoff in the left lower extremity. On questioning the patient she does have custom made compression stockings and also has a lymphedema pump but has not been using it appropriately and has not been taking good care of herself. 09/17/2017 -- she returns today with compression stockings on the left side and the right side has had significant amount of drainage and has a very strong odor 09/24/2017 -- the drainage is increased significantly and she has more lymphedema and a very strong odor to her wound. Though she does not have systemic Salmon, Neelam [name] ([id number]) [id number] Page 2 of 9 symptoms, or overt infection I believe she will benefit from some doxycycline given empirically. 10/01/2017 -- after starting the doxycycline and changing the dressing twice a week her symptoms and signs have definitely improved overall. 10/08/2017 -- she has completed her course of doxycycline but continues to have a lot of drainage and needs twice a week dressing changes. 11/08/17-she is here in follow-up evaluation for right lower extremity ulcers. She admits to using her lymphedema pumps twice daily, one hour per session. she is voicing no complaints or concerns, no signs of infection will change to [facility] 12/14/17 on evaluation today patient appears to be doing very well in regard to her wounds. She has been tolerating the dressing changes she continues to develop some portly the adherent granular tissue on the surface of the wound with some Slough. Obviously we are trying to get too much better wound bed. With that being said the hyper granulation  the Hydrofera Blue Dressing to have helped with which is excellent news. However I  think it may be time to try something a little bit different at this point. 01/11/18 on evaluation today patient appears to be doing fairly well in regard to her right lateral lower extremity ulcers. This shows excellent signs of filling in which is great news. There does not appear to be any evidence of infection which is also good news. She does continue to work as well is good school. She is having no pain. 01/22/18 on evaluation today patient appears to be doing a little bit worse in my opinion in regard to the overall quality of that granulation on her right lower extremity. She was not here last week due to being sick with a stomach virus this may have something to do with the fact that her wound appears to be a little bit worse. With that being said I'm also thinking that after switching from the [place] Dressing to the silver collagen would really has not looked that's good in my opinion. We may want to swit 02/11/18 on evaluation today patient's right lower/lateral lower extremity ulcers appear to be doing very well at this point. Especially the more proximal ulcer has filled in much closer to surface which is good news. Nonetheless both show signs of improvement which is great news. There does not appear to be any evidence of infection which is also good news. In general patient has been doing well tolerating the wraps as well as the [employer]. 02/18/18 on evaluation today patient appears to be doing a little bit worse in regard to the periwound region the wounds themselves do not look much deteriorated to me. With that being said she has several small blisters/pustules noted in the periwound and there was a significant amount of drainage and maceration compared to previous. There has been a time that we had to bring her back for twice a week dressing changes as far as her wrap was concerned it  has been a while since we've done that however. With that being said the patient has been having some burning and in general I'm concerned about the possibility of infection. She has previously taken doxycycline with good result. Fortunately there does not appear to be any evidence of overall worsening in regard to the size of the wound and in fact the upper wound actually appears to be showing signs of good epithelialization. 03/18/18 on evaluation today patient appears to be doing excellent in regard to her right lateral lower extremity ulcer. She has been tolerating the dressing changes without complication. Fortunately this seems to be making great progress. Overall I see no signs of infection and there is dramatic improvement overall even compared to last week. 03/28/18 on evaluation today patient appears to be doing very well in regard to her right lateral lower surety ulcer. She has been tolerating the dressing changes without complication at this point. She states currently that she's having no significant discomfort which is excellent as well. Overall I'm pleased with how things seem to be progressing. 04/01/18 on evaluation today patient appears to be doing excellent in regard to her right lateral lower extremity ulcers. She has been tolerating the dressing changes without complication which is good news. With that being said the wraps still continues to show signs of helping with her fluid she does have Juxta-Lite compression stockings for when we are done with the current treatment regimen once everything heals. Mainly she just has the one area still remaining the smaller of the two wings is pretty much  closed at this point there's just a very slight opening noted. 04/08/18 on evaluation today patient appears to be doing better in regard to the original wound on the right lateral lower extremity that we have been managing. Unfortunately she has a new area of weeping more anterior on the right  lateral lower extremity and on the left lower extremity she has two new ulcers there appears to be some cellulitis noted at this time. I am concerned about the fact that this may in fact be an infection that has caused the worsening and swelling in the past this has been the case when we previously attempted to determine what was going on when she had down slides like this. With that being said the patient is seeming to tolerate the wraps fairly well for the most part. 04/17/18; since last time the patient was seen in this clinic she was hospitalized from 04/11/18 through 04/13/18; she presented with bilateral lower extremity pain worse on the right and a fever of up to 104. Noteworthy that when she was in the clinic last week she had new wounds on the left leg culture grew MRSA and she was prescribed Bactrim. She had 2 days of IV Vanco and Zosyn in the hospital. Her blood cultures were negative. She was discharged on Bactrim to cover the original MRSA on the right leg and Keflex to cover the possibility of strep. The hospitalist had a conversation with infectious disease. The patient arrives in clinic today for nurse check however given the recent hospitalization I was asked to look at her. The patient states she feels a lot better. No fever or chills. Still some pain in the right calf but a lot better. She arrived in the hospital with a white count of 15.6, the next day was 10.8. Comprehensive metabolic panel was normal. She is still taking Keflex and Bactrim It would appear that she had a surgical IandD at the bedside of the right calf felt to have a underlying abscess. According to our intake nurse the wound has expanded quite bit on the right lateral calf. She has no open area on the left anterior and left posterior calf as described last time 04/23/18 on evaluation today patient actually appears to be doing much better than when I last saw her. This obviously has been a couple weeks ago and in  the interim she was admitted to the hospital for IV antibiotic therapy due to cellulitis, discharge, and fortunately the substance which hadn't sued has completely resolved. Her swelling seems to be better in regard to her lower extremities as well and the wounds that opened up as results of the infection seems to be showing signs of improvement. There is some Slough noted on the left lateral wound otherwise a lot of weeping noted of the right lateral leg. 04/29/18 on evaluation today patient appears to be doing excellent in regard to her bilateral lower extremity ulcers. She's been tolerating the dressing changes without complication there does not appear to be any evidence of infection at this time. Overall I think she is made great improvement and seems to be showing signs of coming back around where she was prior to the cellulitis and sepsis episode. 05/06/18 on evaluation today patient appears to be doing better in regard to her bilateral ocean the ulcers. She's been tolerating the dressing changes without complication. Fortunately there does not appear to be any evidence of infection at this time. Her swelling is significantly down overall seems to be  showing signs of good improvement as well. Fortunately I do believe that she is progressing nicely back to where she was prior to the cellulitis/sepsis issue. 05/13/18 on evaluation today patient seems to be showing signs of great improvement she's been tolerating the dressing changes without complication and overall there does not appear to be any evidence of infection. All of which is good news. The only issue she really have today is that some of the [facility] Dressing actually was stuck to the periwound of the right lateral lower extremity however this was able to be removed during the debridement without complication. 05/21/18 on evaluation today patient appears to be doing very well in regard to her bilateral lower extremity ulcers. She has  been tolerating the compression wraps without complication. There does not appear to be any evidence of infection at this point which is good news as well. Overall I'm very pleased with the progress that has been made. She likewise is very happy. 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 05/28/18 on evaluation today patient actually appears to be doing excellent in regard to her bilateral lower [place] ulcers. She's made great progress since last week and overall I'm very pleased in this regard. She states that she's not have any discomfort either which is also good news there's definitely no evidence of infection. 06/03/18 on evaluation today patient actually appears to be doing excellent in regard to her ulcerations in fact these areas on both lower extremities were almost Oquin, Albirta [name] ([id number]) [phone].pdf Page 3 of 9 completely healed. Unfortunately she has a situation going on her life right now she actually found her husband deceased last December 03, 2023. She states that she has been quite a bit upset since that time unfortunately this is obviously a very big blow to her. Nonetheless she is concerned about the funeral and having to wear certain shoes for this that she states she cannot fit in with the wraps. She wonders if she could potentially come back on 12/03/23 to have her wraps changed and to switch into her Juxta-Lite compression at that point. I think that would be an okay thing that we could do for her. Nonetheless she fortunately seems to be tolerating the wraps very well and again has made excellent progress with the [facility] Dressing 06/11/18 on evaluation today patient appears to be doing rather well in regard to her bilateral lower extremities in fact everything appears to be completely healed at this point which is excellent news. There does  not appear to be any evidence of infection at this time which is great. In fact overall I do believe she is completely resolved in regard to her bilateral lower extremity ulcers. She is extremely happy in this regard. 06/27/2023 Ms. Sung Gavaghan is a 42 year old female with a past medical history of venous insufficiency and lymphedema that presents to the clinic for a 3-week history of nonhealing wound to the right lower extremity. She states this started spontaneously. She does not wear compression stockings. She has been keeping the area covered. She currently denies signs of infection. 8/14; patient readmitted to clinic last week. She has venous insufficiency and chronic secondary lymphedema. She has a nonhealing wound on the right lateral lower leg. She has been using gent mupirocin and Hydrofera Blue using an Urgo K2 lite wrap. Her ABI on the right leg was 0.74 on admission to clinic. Formal  arterial studies were ordered but we have not yet had any contact with the patient. Also there was some question about juxta lites for this patient we have not heard anything about that either. 8/28; patient presents for follow-up. She had ABIs completed on 8/23 that showed noncompressible ABIs on the right and left with TBI of 0.88 on the left and 0.96 on the right. She had triphasic waveforms throughout the arterial system. This is reassuring that she has adequate blood flow for healing. We have been using antibiotic ointment with Hydrofera Blue under Urgo K2 lite wrap. She has tolerated this well. Wound is smaller. 9/4; patient presents for follow-up. We have been using Urgo 2 regular4-layer equivalent as well as Hydrofera Blue and antibiotic ointment. Wound is slightly smaller. She has no issues or complaints. She tolerated the wrap well. 9/11; patient presents for follow-up. We have been using Hydrofera Blue with antibiotic ointment under Urgo regular compression. She states the wrap slid down on Sunday  and she has been keeping the area covered for the past 2 to 3 days. 9/18; small wound on the right lateral lower leg this is in the setting of significant lymphedema. We have you been using gent and mupirocin with Hydrofera Blue and Urgo K2 regular compression. The wound is about the same size this week. At 1 point she got wraparound stockings/external compression stockings during her time in our [place] [facility]. I am not sure she is actually been wearing these in fact I think not 9/25; right lateral leg in the setting of very significant stage III lymphedema. We have been using gent and mupirocin under Hydrofera Blue and using an Urgo K2 lite. 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 She tells [place] a pipe burst in her apartment she got the wrap wet while cleaning up took it off 10/9; right lateral lower leg in the setting of stage III lymphedema. Small punched-out area but with a continuously nonviable surface. I changed to her to Iodoflex last week to help with the surface although she still comes in with a lot of fibrinous debris 10/16; right lateral lower leg wound in the setting of stage III lymphedema significant hemosiderin deposition but no obvious erythema. She arrives in clinic today with a much better looking wound surface. I started her on Iodoflex 2 weeks ago and did an aggressive debridement last week. The dimensions are slightly slightly larger today probably because of the debridement, however I think the surface of the wound is quite a bit better today. 10/23; comes in today with a large amount of sanguinous drainage. This is different for this patient. She  had no debridement last week. She says that there is some more pain than she is used to. She has not been systemically unwell. We have been using Iodoflex 10/30; patient had a wound infection last week which was new for her. Culture identified to have a growth of Citrobacter Koseri. It was pansensitive including to the doxycycline I gave her but still probably not the correct choice of antibiotic. She is still having drainage. The wound has improved but still does not look like what we had a few weeks  ago. 09-26-2023 upon evaluation today patient appears to be doing well currently in regard to her wound. She has been tolerating the dressing changes without complication. Fortunately there does not appear to be any signs of infection and she is making excellent progress towards complete closure. With that being said there is some need for sharp debridement today and also believe that she probably needs to have a continuation of therapy with regard to her infection. I would actually put her on Levaquin for 2 weeks based on what I am seeing from a drainage standpoint. 10-03-2023 upon evaluation today patient appears to be doing well currently in regard to her wound. She has been tolerating the dressing changes without complication. Fortunately there does not appear to be any signs of infection at this time. 10-10-2023 upon evaluation today patient appears to be doing well currently in regard to her wound. She is actually tolerating the dressing changes without complication she is making excellent progress towards healing. Fortunately I am extremely pleased with where we stand today. 10-17-2023 upon evaluation today patient appears to be doing well currently in regard to her wound which is actually showing signs of excellent improvement. I do believe that she is making really good headway here and I am very pleased with where we stand I am hopeful that she will continue to show signs of good improvement  going forward. 11-02-2023 upon evaluation today patient appears to be doing well currently in regard to her wound. She is tolerating the dressing changes without complication. Fortunately there does not appear to be any signs of active infection at this time which is excellent news. 11-09-2023 upon evaluation today patient appears to be doing well currently in regard to her wound at this point. Fortunately this appears to be completely healed based on what I am seeing. Fortunately I do not see any signs of active infection at this time. Electronic Signature(s) Signed: 11/09/2023 9:21:17 AM By: Allen Derry PA-C Entered By: Allen Derry on 11/09/2023 06:21:17 [name] ([id number]) [id number] Page 4 of 9 -------------------------------------------------------------------------------- Physical Exam Details Patient Name: [facility] Deicy [name]. 11/09/2023 8:45 A M Medical Record Number: [id number] Patient Account Number: [id number] Date of Birth/Sex: Treating RN: [birth year] (42 y.o. [name] Primary Care Provider: PA Zenovia Jordan, [place] Other Clinician: Referring Provider: Treating Provider/Extender: Allen Derry Self, Referral Weeks in Treatment: 59 Constitutional Obese and well-hydrated in no acute distress. Respiratory normal breathing without difficulty. Psychiatric this patient is able to make decisions and demonstrates good insight into disease process. Alert and Oriented x 3. pleasant and cooperative. Notes Upon inspection patient's wound bed actually showed signs of good granulation epithelization at this point. She appears to be completely healed which is great news and overall very pleased with where things stand she is gena be going to getting compression socks now that she is out of the wrap. Electronic Signature(s) Signed: 11/09/2023 9:21:32 AM By: Allen Derry PA-C Entered By: Allen Derry on 11/09/2023  06:21:31 -------------------------------------------------------------------------------- Physician Orders Details Patient Name: Date of Service: [name], Cait [name]. 11/09/2023 8:45 A M Medical Record Number: [id number] Patient Account Number: [id number] Date of Birth/Sex: Treating RN: 04/23/1981 (42 y.o. [name] Primary Care Provider: PA Zenovia Jordan, [place] Other Clinician: Referring Provider: Treating Provider/Extender: Allen Derry Self, Referral Weeks in Treatment: 19 The following information was scribed by: Midge Aver The information was scribed for: Allen Derry Verbal / Phone Orders: No Diagnosis Coding ICD-10 Coding Code Description [phone] Non-pressure chronic ulcer of other  part of right lower leg with fat layer exposed I87.311 Chronic venous hypertension (idiopathic) with ulcer of right lower extremity I89.0 Lymphedema, not elsewhere classified L03.115 Cellulitis of right lower limb Ungaro, Sathvika [name] ([id number]) [id number] Page 5 of 9 Follow-up Appointments Return Appointment in 2 weeks. Edema Control - Orders / Instructions Tubigrip single layer applied. - F with JuxtaLite velcro wrap over. Electronic Signature(s) Signed: 11/09/2023 9:35:46 AM By: Midge Aver MSN RN CNS WTA Signed: 11/09/2023 12:16:54 PM By: Allen Derry PA-C Entered By: Midge Aver on 11/09/2023 06:35:45 -------------------------------------------------------------------------------- Problem List Details Patient Name: Date of Service: [name], Charidy [name]. 11/09/2023 8:45 A M Medical Record Number: [id number] Patient Account Number: [id number] Date of Birth/Sex: Treating RN: 06/25/1981 (42 y.o. [name] Primary Care Provider: PA TIENT, [place] Other Clinician: Referring Provider: Treating Provider/Extender: Allen Derry Self, Referral Weeks in Treatment: 19 Active Problems ICD-10 Encounter Code Description Active Date MDM Diagnosis L97.812 Non-pressure chronic ulcer of  other part of right lower leg with fat layer 06/27/2023 No Yes exposed I87.311 Chronic venous hypertension (idiopathic) with ulcer of right lower extremity 06/27/2023 No Yes I89.0 Lymphedema, not elsewhere classified 06/27/2023 No Yes L03.115 Cellulitis of right lower limb 09/12/2023 No Yes Inactive Problems Resolved Problems Electronic Signature(s) Signed: 11/09/2023 9:02:10 AM By: Allen Derry PA-C Entered By: Allen Derry on 11/09/2023 06:02:10 Ainley, Sibbie [name] ([id number]) [id number] Page 6 of 9 -------------------------------------------------------------------------------- Progress Note Details Patient Name: Date of Service: SHAUNYA, HASSLER [name]. 11/09/2023 8:45 A M Medical Record Number: [id number] Patient Account Number: [id number] Date of Birth/Sex: Treating RN: 03/16/1981 (42 y.o. [name] Primary Care Provider: PA TIENT, NO Other Clinician: Referring Provider: Treating Provider/Extender: Allen Derry Self, Referral Weeks in Treatment: 70 Subjective Chief Complaint Information obtained from Patient 06/27/2023; right lower extremity wound History of Present Illness (HPI) 42 year old patient well known to our [facility] wound care clinic where she has been seen since 2016 for bilateral lower extremity venous insufficiency disease with lymphedema and multiple ulcerations associated with morbid obesity. she had custom-made compression stockings and lymphedema pumps which were used in the past. most recently she was admitted to the hospital between October 11 and 09/02/2017 with sepsis, lower extremity wounds and lymphedema.she was initially treated in the outpatient with Keflex and Bactrim. she was initially treated in the hospital with vancomycin and Zosyn and changed over to Unasyn until her white count improved and her blood cultures were negative for 3 days. After her inpatient management she was discharged home on Augmentin to end on 09/13/2017 with a 14  day course. she has had outpatient vascular duplex scans completed in November 2017 and her right ABI was 1.1 and the left ABI is 1.3. she had normal toe brachial indices bilaterally.she had three-vessel runoff in the right lower extremity and two-vessel runoff in the left lower extremity. On questioning the patient she does have custom made compression stockings and also has a lymphedema pump but has not been using it appropriately and has not been taking good care of herself. 09/17/2017 -- she returns today with compression stockings on the left side and the right side has had significant amount of drainage and has a very strong odor 09/24/2017 -- the drainage is increased significantly and she has more lymphedema and a very strong odor to her wound. Though she does not have systemic symptoms, or overt infection I believe she will benefit from some doxycycline given empirically. 10/01/2017 -- after starting the doxycycline and changing the dressing twice a week her symptoms  and signs have definitely improved overall. 10/08/2017 -- she has completed her course of doxycycline but continues to have a lot of drainage and needs twice a week dressing changes. 11/08/17-she is here in follow-up evaluation for right lower extremity ulcers. She admits to using her lymphedema pumps twice daily, one hour per session. she is voicing no complaints or concerns, no signs of infection will change to [facility] 12/14/17 on evaluation today patient appears to be doing very well in regard to her wounds. She has been tolerating the dressing changes she continues to develop some portly the adherent granular tissue on the surface of the wound with some Slough. Obviously we are trying to get too much better wound bed. With that being said the hyper granulation the Hydrofera Blue Dressing to have helped with which is excellent news. However I think it may be time to try something a little bit different at this point. 01/11/18  on evaluation today patient appears to be doing fairly well in regard to her right lateral lower extremity ulcers. This shows excellent signs of filling in which is great news. There does not appear to be any evidence of infection which is also good news. She does continue to work as well is good school. She is having no pain. 01/22/18 on evaluation today patient appears to be doing a little bit worse in my opinion in regard to the overall quality of that granulation on her right lower extremity. She was not here last week due to being sick with a stomach virus this may have something to do with the fact that her wound appears to be a little bit worse. With that being said I'm also thinking that after switching from the [facility] Dressing to the silver collagen would really has not looked that's good in my opinion. We may want to swit 02/11/18 on evaluation today patient's right lower/lateral lower extremity ulcers appear to be doing very well at this point. Especially the more proximal ulcer has filled in much closer to surface which is good news. Nonetheless both show signs of improvement which is great news. There does not appear to be any evidence of infection which is also good news. In general patient has been doing well tolerating the wraps as well as the [employer]. 02/18/18 on evaluation today patient appears to be doing a little bit worse in regard to the periwound region the wounds themselves do not look much deteriorated to me. With that being said she has several small blisters/pustules noted in the periwound and there was a significant amount of drainage and maceration compared to previous. There has been a time that we had to bring her back for twice a week dressing changes as far as her wrap was concerned it has been a while since we've done that however. With that being said the patient has been having some burning and in general I'm concerned about the possibility  of infection. She has previously taken doxycycline with good result. Fortunately there does not appear to be any evidence of overall worsening in regard to the size of the wound and in fact the upper wound actually appears to be showing signs of good epithelialization. 03/18/18 on evaluation today patient appears to be doing excellent in regard to her right lateral lower extremity ulcer. She has been tolerating the dressing changes without complication. Fortunately this seems to be making great progress. Overall I see no signs of infection and there is dramatic improvement overall even compared  to last week. 03/28/18 on evaluation today patient appears to be doing very well in regard to her right lateral lower surety ulcer. She has been tolerating the dressing changes without complication at this point. She states currently that she's having no significant discomfort which is excellent as well. Overall I'm pleased with how things seem to be progressing. VERNON, GRENDA [name] ([id number]) [id number] Page 7 of 9 04/01/18 on evaluation today patient appears to be doing excellent in regard to her right lateral lower extremity ulcers. She has been tolerating the dressing changes without complication which is good news. With that being said the wraps still continues to show signs of helping with her fluid she does have Juxta-Lite compression stockings for when we are done with the current treatment regimen once everything heals. Mainly she just has the one area still remaining the smaller of the two wings is pretty much closed at this point there's just a very slight opening noted. 04/08/18 on evaluation today patient appears to be doing better in regard to the original wound on the right lateral lower extremity that we have been managing. Unfortunately she has a new area of weeping more anterior on the right lateral lower extremity and on the left lower extremity she has two new ulcers  there appears to be some cellulitis noted at this time. I am concerned about the fact that this may in fact be an infection that has caused the worsening and swelling in the past this has been the case when we previously attempted to determine what was going on when she had down slides like this. With that being said the patient is seeming to tolerate the wraps fairly well for the most part. 04/17/18; since last time the patient was seen in this clinic she was hospitalized from 04/11/18 through 04/13/18; she presented with bilateral lower extremity pain worse on the right and a fever of up to 104. Noteworthy that when she was in the clinic last week she had new wounds on the left leg culture grew MRSA and she was prescribed Bactrim. She had 2 days of IV Vanco and Zosyn in the hospital. Her blood cultures were negative. She was discharged on Bactrim to cover the original MRSA on the right leg and Keflex to cover the possibility of strep. The hospitalist had a conversation with infectious disease. The patient arrives in clinic today for nurse check however given the recent hospitalization I was asked to look at her. The patient states she feels a lot better. No fever or chills. Still some pain in the right calf but a lot better. She arrived in the hospital with a white count of 15.6, the next day was 10.8. Comprehensive metabolic panel was normal. She is still taking Keflex and Bactrim It would appear that she had a surgical IandD at the bedside of the right calf felt to have a underlying abscess. According to our intake nurse the wound has expanded quite bit on the right lateral calf. She has no open area on the left anterior and left posterior calf as described last time 04/23/18 on evaluation today patient actually appears to be doing much better than when I last saw her. This obviously has been a couple weeks ago and in the interim she was admitted to the hospital for IV antibiotic therapy due to  cellulitis, discharge, and fortunately the substance which hadn't sued has completely resolved. Her swelling seems to be better in regard to her lower extremities as well and the  wounds that opened up as results of the infection seems to be showing signs of improvement. There is some Slough noted on the left lateral wound otherwise a lot of weeping noted of the right lateral leg. 04/29/18 on evaluation today patient appears to be doing excellent in regard to her bilateral lower extremity ulcers. She's been tolerating the dressing changes without complication there does not appear to be any evidence of infection at this time. Overall I think she is made great improvement and seems to be showing signs of coming back around where she was prior to the cellulitis and sepsis episode. 05/06/18 on evaluation today patient appears to be doing better in regard to her bilateral ocean the ulcers. She's been tolerating the dressing changes without complication. Fortunately there does not appear to be any evidence of infection at this time. Her swelling is significantly down overall seems to be showing signs of good improvement as well. Fortunately I do believe that she is progressing nicely back to where she was prior to the cellulitis/sepsis issue. 05/13/18 on evaluation today patient seems to be showing signs of great improvement she's been tolerating the dressing changes without complication and overall there does not appear to be any evidence of infection. All of which is good news. The only issue she really have today is that some of the [facility] Dressing actually was stuck to the periwound of the right lateral lower extremity however this was able to be removed during the debridement without complication. 05/21/18 on evaluation today patient appears to be doing very well in regard to her bilateral lower extremity ulcers. She has been tolerating the compression wraps without complication. There does not  appear to be any evidence of infection at this point which is good news as well. Overall I'm very pleased with the progress that has been made. She likewise is very happy. 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 05/28/18 on evaluation today patient actually appears to be doing excellent in regard to her bilateral lower [place] ulcers. She's made great progress since last week and overall I'm very pleased in this regard. She states that she's not have any discomfort either which is also good news there's definitely no evidence of infection. 06/03/18 on evaluation today patient actually appears to be doing excellent in regard to her ulcerations in fact these areas on both lower extremities were almost completely healed. Unfortunately she has a situation going on her life right now she actually found her husband deceased last 12/10/2023. She states that she has been quite a bit upset since that time unfortunately this is obviously a very big blow to her. Nonetheless she is concerned about the funeral and having to wear certain shoes for this that she states she cannot fit in with the wraps. She wonders if she could potentially come back on 12-10-23 to have her wraps changed and to switch into her Juxta-Lite compression at that point. I think that would be an okay thing that we could do for her. Nonetheless she fortunately seems to be tolerating the wraps very well and again has made excellent progress with the [facility] Dressing 06/11/18 on evaluation today patient appears to be doing rather well in regard to her bilateral lower extremities in fact everything appears to be completely healed at this point which is excellent news. There does not appear to be any evidence of infection at  this time which is great. In fact overall I do believe she is completely resolved in regard to her bilateral  lower extremity ulcers. She is extremely happy in this regard. 06/27/2023 Ms. Shawnee Penick is a 42 year old female with a past medical history of venous insufficiency and lymphedema that presents to the clinic for a 3-week history of nonhealing wound to the right lower extremity. She states this started spontaneously. She does not wear compression stockings. She has been keeping the area covered. She currently denies signs of infection. 8/14; patient readmitted to clinic last week. She has venous insufficiency and chronic secondary lymphedema. She has a nonhealing wound on the right lateral lower leg. She has been using gent mupirocin and Hydrofera Blue using an Urgo K2 lite wrap. Her ABI on the right leg was 0.74 on admission to clinic. Formal arterial studies were ordered but we have not yet had any contact with the patient. Also there was some question about juxta lites for this patient we have not heard anything about that either. 8/28; patient presents for follow-up. She had ABIs completed on 8/23 that showed noncompressible ABIs on the right and left with TBI of 0.88 on the left and 0.96 on the right. She had triphasic waveforms throughout the arterial system. This is reassuring that she has adequate blood flow for healing. We have been using antibiotic ointment with Hydrofera Blue under Urgo K2 lite wrap. She has tolerated this well. Wound is smaller. 9/4; patient presents for follow-up. We have been using Urgo 2 regular4-layer equivalent as well as Hydrofera Blue and antibiotic ointment. Wound is slightly smaller. She has no issues or complaints. She tolerated the wrap well. 9/11; patient presents for follow-up. We have been using Hydrofera Blue with antibiotic ointment under Urgo regular compression. She states the wrap slid down on Sunday and she has been keeping the area covered for the past 2 to 3 days. 9/18; small wound on the right lateral lower leg this is in the setting of significant  lymphedema. We have you been using gent and mupirocin with Hydrofera Blue and Urgo K2 regular compression. The wound is about the same size this week. At 1 point she got wraparound stockings/external compression stockings during her time in our [place] [facility] clinic. I am not sure she is actually been wearing these in fact I think not 9/25; right lateral leg in the setting of very significant stage III lymphedema. We have been using gent and mupirocin under Hydrofera Blue and using an Urgo K2 lite. She works in a daycare 10/2; right lateral leg in the setting of stage III lymphedema which is marginally controlled and spite of 4-layer equivalent compression with Harlene Salts we have Bradwell, Shearon [name] ([id number]) [phone].pdf Page 8 of 9 been using gent and mupirocin with Hydrofera Blue, not making much progress however in the small circular wound with a very fibrinous surface. 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 She tells [place] a pipe burst in her apartment she got the wrap wet while cleaning up took it off 10/9; right lateral lower leg in the setting of stage III lymphedema. Small punched-out area but with a continuously nonviable surface. I changed to her to Iodoflex last week to help with the surface although she still comes in with a lot of fibrinous debris 10/16; right lateral lower leg wound in the setting of stage III lymphedema significant hemosiderin deposition but no obvious erythema. She arrives in clinic today with a much better looking  wound surface. I started her on Iodoflex 2 weeks ago and did an aggressive debridement last week. The dimensions are slightly slightly larger today probably because of the debridement, however I think the surface of the wound is quite a bit better today. 10/23; comes in today with a large amount of sanguinous drainage. This is different for this patient. She had no debridement last week. She says that there is some more pain than  she is used to. She has not been systemically unwell. We have been using Iodoflex 10/30; patient had a wound infection last week which was new for her. Culture identified to have a growth of Citrobacter Koseri. It was pansensitive including to the doxycycline I gave her but still probably not the correct choice of antibiotic. She is still having drainage. The wound has improved but still does not look like what we had a few weeks ago. 09-26-2023 upon evaluation today patient appears to be doing well currently in regard to her wound. She has been tolerating the dressing changes without complication. Fortunately there does not appear to be any signs of infection and she is making excellent progress towards complete closure. With that being said there is some need for sharp debridement today and also believe that she probably needs to have a continuation of therapy with regard to her infection. I would actually put her on Levaquin for 2 weeks based on what I am seeing from a drainage standpoint. 10-03-2023 upon evaluation today patient appears to be doing well currently in regard to her wound. She has been tolerating the dressing changes without complication. Fortunately there does not appear to be any signs of infection at this time. 10-10-2023 upon evaluation today patient appears to be doing well currently in regard to her wound. She is actually tolerating the dressing changes without complication she is making excellent progress towards healing. Fortunately I am extremely pleased with where we stand today. 10-17-2023 upon evaluation today patient appears to be doing well currently in regard to her wound which is actually showing signs of excellent improvement. I do believe that she is making really good headway here and I am very pleased with where we stand I am hopeful that she will continue to show signs of good improvement going forward. 11-02-2023 upon evaluation today patient appears to be doing  well currently in regard to her wound. She is tolerating the dressing changes without complication. Fortunately there does not appear to be any signs of active infection at this time which is excellent news. 11-09-2023 upon evaluation today patient appears to be doing well currently in regard to her wound at this point. Fortunately this appears to be completely healed based on what I am seeing. Fortunately I do not see any signs of active infection at this time. Objective Constitutional Obese and well-hydrated in no acute distress. Vitals Time Taken: 8:57 AM, Height: 73 in, Weight: 436 lbs, BMI: 57.5, Temperature: 98.0 F, Pulse: 72 bpm, Respiratory Rate: 18 breaths/min, Blood Pressure: 133/95 mmHg. Respiratory normal breathing without difficulty. Psychiatric this patient is able to make decisions and demonstrates good insight into disease process. Alert and Oriented x 3. pleasant and cooperative. General Notes: Upon inspection patient's wound bed actually showed signs of good granulation epithelization at this point. She appears to be completely healed which is great news and overall very pleased with where things stand she is gena be going to getting compression socks now that she is out of the wrap. Integumentary (Hair, Skin) Wound #7 status  is Open. Original cause of wound was Gradually Appeared. The date acquired was: 06/06/2023. The wound has been in treatment 19 weeks. The wound is located on the Right,Lateral Lower Leg. The wound measures 0.1cm length x 0.1cm width x 0.1cm depth; 0.008cm^2 area and 0.001cm^3 volume. There is Fat Layer (Subcutaneous Tissue) exposed. There is a medium amount of serosanguineous drainage noted. There is small (1-33%) red, pink granulation within the wound bed. There is a medium (34-66%) amount of necrotic tissue within the wound bed including Adherent Slough. Assessment Active Problems ICD-10 Non-pressure chronic ulcer of other part of right lower leg with  fat layer exposed Chronic venous hypertension (idiopathic) with ulcer of right lower extremity Lymphedema, not elsewhere classified Cellulitis of right lower limb Priestly, Kellyjo [name] ([id number]) [id number] Page 9 of 9 Plan 1. In the meantime patient is going be wearing her Velcro compression wrap along with Tubigrip to put on her today size F. 2. I am also can recommend that she should continue to monitor for any signs of infection or worsening. Obviously based on what I am seeing I feel like she is making good headway here towards closure. We will see patient back for reevaluation in 1 week here in the clinic. If anything worsens or changes patient will contact our office for additional recommendations. Electronic Signature(s) Signed: 11/09/2023 9:21:54 AM By: Allen Derry PA-C Entered By: Allen Derry on 11/09/2023 06:21:54 -------------------------------------------------------------------------------- SuperBill Details Patient Name: Date of Service: [name], [name] 11/09/2023 Medical Record Number: [id number] Patient Account Number: [id number] Date of Birth/Sex: Treating RN: 11/09/1981 (42 y.o. [name] Primary Care Provider: PA Zenovia Jordan, NO Other Clinician: Referring Provider: Treating Provider/Extender: Allen Derry Self, Referral Weeks in Treatment: 19 Diagnosis Coding ICD-10 Codes Code Description [phone] Non-pressure chronic ulcer of other part of right lower leg with fat layer exposed I87.311 Chronic venous hypertension (idiopathic) with ulcer of right lower extremity I89.0 Lymphedema, not elsewhere classified L03.115 Cellulitis of right lower limb Facility Procedures : CPT4 Code: [id number] Description: 99213 - WOUND CARE VISIT-LEV 3 EST PT Modifier: Quantity: 1 Physician Procedures : CPT4 Code Description Modifier [id number] 99213 - WC PHYS LEVEL 3 - EST PT ICD-10 Diagnosis Description L97.812 Non-pressure chronic ulcer of other part of right  lower leg with fat layer exposed I87.311 Chronic venous hypertension (idiopathic) with ulcer  of right lower extremity I89.0 Lymphedema, not elsewhere classified L03.115 Cellulitis of right lower limb Quantity: 1 Electronic Signature(s) Signed: 11/09/2023 9:38:53 AM By: Midge Aver MSN RN CNS WTA Signed: 11/09/2023 12:16:54 PM By: Allen Derry PA-C Previous Signature: 11/09/2023 9:22:05 AM Version By: Allen Derry PA-C Entered By: Midge Aver on 11/09/2023 06:38:53

## 2023-11-23 ENCOUNTER — Ambulatory Visit: Payer: Self-pay | Admitting: Physician Assistant

## 2024-01-19 DIAGNOSIS — Z419 Encounter for procedure for purposes other than remedying health state, unspecified: Secondary | ICD-10-CM | POA: Diagnosis not present

## 2024-03-01 DIAGNOSIS — Z419 Encounter for procedure for purposes other than remedying health state, unspecified: Secondary | ICD-10-CM | POA: Diagnosis not present

## 2024-03-31 DIAGNOSIS — Z419 Encounter for procedure for purposes other than remedying health state, unspecified: Secondary | ICD-10-CM | POA: Diagnosis not present

## 2024-04-08 ENCOUNTER — Encounter (INDEPENDENT_AMBULATORY_CARE_PROVIDER_SITE_OTHER): Payer: Self-pay

## 2024-05-01 DIAGNOSIS — Z419 Encounter for procedure for purposes other than remedying health state, unspecified: Secondary | ICD-10-CM | POA: Diagnosis not present

## 2024-05-15 ENCOUNTER — Ambulatory Visit (INDEPENDENT_AMBULATORY_CARE_PROVIDER_SITE_OTHER): Payer: Self-pay | Admitting: Nurse Practitioner

## 2024-05-15 ENCOUNTER — Ambulatory Visit (HOSPITAL_COMMUNITY)
Admission: RE | Admit: 2024-05-15 | Discharge: 2024-05-15 | Disposition: A | Discharge status: Home / Self Care | Source: Ambulatory Visit | Attending: Nurse Practitioner | Admitting: Nurse Practitioner

## 2024-05-15 ENCOUNTER — Ambulatory Visit: Payer: Self-pay | Admitting: Nurse Practitioner

## 2024-05-15 ENCOUNTER — Encounter: Payer: Self-pay | Admitting: Nurse Practitioner

## 2024-05-15 VITALS — BP 124/71 | HR 76 | Temp 98.0°F | Wt >= 6400 oz

## 2024-05-15 DIAGNOSIS — R600 Localized edema: Secondary | ICD-10-CM | POA: Diagnosis not present

## 2024-05-15 DIAGNOSIS — M79671 Pain in right foot: Secondary | ICD-10-CM | POA: Diagnosis not present

## 2024-05-15 DIAGNOSIS — Z139 Encounter for screening, unspecified: Secondary | ICD-10-CM

## 2024-05-15 DIAGNOSIS — S92514A Nondisplaced fracture of proximal phalanx of right lesser toe(s), initial encounter for closed fracture: Secondary | ICD-10-CM

## 2024-05-15 DIAGNOSIS — Z0000 Encounter for general adult medical examination without abnormal findings: Secondary | ICD-10-CM | POA: Diagnosis not present

## 2024-05-15 DIAGNOSIS — Z13220 Encounter for screening for lipoid disorders: Secondary | ICD-10-CM | POA: Diagnosis not present

## 2024-05-15 DIAGNOSIS — Z1329 Encounter for screening for other suspected endocrine disorder: Secondary | ICD-10-CM

## 2024-05-15 DIAGNOSIS — S99921A Unspecified injury of right foot, initial encounter: Secondary | ICD-10-CM

## 2024-05-15 NOTE — Patient Instructions (Signed)
 Health Maintenance, Female Adopting a healthy lifestyle and getting preventive care are important in promoting health and wellness. Ask your health care provider about: The right schedule for you to have regular tests and exams. Things you can do on your own to prevent diseases and keep yourself healthy. What should I know about diet, weight, and exercise? Eat a healthy diet  Eat a diet that includes plenty of vegetables, fruits, low-fat dairy products, and lean protein. Do not eat a lot of foods that are high in solid fats, added sugars, or sodium. Maintain a healthy weight Body mass index (BMI) is used to identify weight problems. It estimates body fat based on height and weight. Your health care provider can help determine your BMI and help you achieve or maintain a healthy weight. Get regular exercise Get regular exercise. This is one of the most important things you can do for your health. Most adults should: Exercise for at least 150 minutes each 45 week. The exercise should increase your heart rate and make you sweat (moderate-intensity exercise). Do strengthening exercises at least twice a week. This is in addition to the moderate-intensity exercise. Spend less time sitting. Even light physical activity can be beneficial. Watch cholesterol and blood lipids Have your blood tested for lipids and cholesterol at 45 years of age, then have this test every 5 years. Have your cholesterol levels checked more often if: Your lipid or cholesterol levels are high. You are older than 45 years of age. You are at high risk for heart disease. What should I know about cancer screening? Depending on your health history and family history, you may need to have cancer screening at various ages. This may include screening for: Breast cancer. Cervical cancer. Colorectal cancer. Skin cancer. Lung cancer. What should I know about heart disease, diabetes, and high blood pressure? Blood pressure and heart  disease High blood pressure causes heart disease and increases the risk of stroke. This is more likely to develop in people who have high blood pressure readings or are overweight. Have your blood pressure checked: Every 3-5 years if you are 45-45 years of age. Every year if you are 45 years old or older. Diabetes Have regular diabetes screenings. This checks your fasting blood sugar level. Have the screening done: Once every three years after age 45 if you are at a normal weight and have a low risk for diabetes. More often and at a younger age if you are overweight or have a high risk for diabetes. What should I know about preventing infection? Hepatitis B If you have a higher risk for hepatitis B, you should be screened for this virus. Talk with your health care provider to find out if you are at risk for hepatitis B infection. Hepatitis C Testing is recommended for: Everyone born from [birth year] through 1965. Anyone with known risk factors for hepatitis C. Sexually transmitted infections (STIs) Get screened for STIs, including gonorrhea and chlamydia, if: You are sexually active and are younger than 45 years of age. You are older than 45 years of age and your health care provider tells you that you are at risk for this type of infection. Your sexual activity has changed since you were last screened, and you are at increased risk for chlamydia or gonorrhea. Ask your health care provider if you are at risk. Ask your health care provider about whether you are at high risk for HIV. Your health care provider may recommend a prescription medicine to help prevent HIV  infection. If you choose to take medicine to prevent HIV, you should first get tested for HIV. You should then be tested every 3 months for as long as you are taking the medicine. Pregnancy If you are about to stop having your period (premenopausal) and you may become pregnant, seek counseling before you get pregnant. Take 400 to 800  micrograms (mcg) of folic acid every day if you become pregnant. Ask for birth control (contraception) if you want to prevent pregnancy. Osteoporosis and menopause Osteoporosis is a disease in which the bones lose minerals and strength with aging. This can result in bone fractures. If you are 45 years old or older, or if you are at risk for osteoporosis and fractures, ask your health care provider if you should: Be screened for bone loss. Take a calcium or vitamin D supplement to lower your risk of fractures. Be given hormone replacement therapy (HRT) to treat symptoms of menopause. Follow these instructions at home: Alcohol use Do not drink alcohol if: Your health care provider tells you not to drink. You are pregnant, may be pregnant, or are planning to become pregnant. If you drink alcohol: Limit how much you have to: 0-1 drink a day. Know how much alcohol is in your drink. In the [place] one drink equals one 12 oz bottle of beer (355 mL), one 5 oz glass of wine (148 mL), or one 1 oz glass of hard liquor (44 mL). Lifestyle Do not use any products that contain nicotine or tobacco. These products include cigarettes, chewing tobacco, and vaping devices, such as e-cigarettes. If you need help quitting, ask your health care provider. Do not use street drugs. Do not share needles. Ask your health care provider for help if you need support or information about quitting drugs. General instructions Schedule regular health, dental, and eye exams. Stay current with your vaccines. Tell your health care provider if: You often feel depressed. You have ever been abused or do not feel safe at home. Summary Adopting a healthy lifestyle and getting preventive care are important in promoting health and wellness. Follow your health care provider's instructions about healthy diet, exercising, and getting tested or screened for diseases. Follow your health care provider's instructions on monitoring your  cholesterol and blood pressure. This information is not intended to replace advice given to you by your health care provider. Make sure you discuss any questions you have with your health care provider. Document Revised: 03/28/2021 Document Reviewed: 03/28/2021 Elsevier Patient Education  2024 [employer].

## 2024-05-15 NOTE — Progress Notes (Signed)
 Subjective   Patient ID: [name], female    DOB: March 17, 1981, 43 y.o.   MRN: [id number]  Chief Complaint  Patient presents with   Establish Care    Patient stated that she feel like she broke her pinky toe    Referring provider: No ref. provider found  [name] is a 43 y.o. female with Past Medical History: No date: Hypertension     Comment:  just today (08/30/2017) No date: Lymphedema of both lower extremities No date: Morbid obesity (HCC)   HPI  Patient presents today to establish care.  She has not had a primary care in several years.  She would like labs today to check on baseline health.  She is also concerned because she injured her right fifth toe yesterday and thinks that it could be broke.  We will order x-ray today.  Patient states that she is currently homeless.  She is currently living with her aunt.  She does need food from the food pantry today.  We will place referral for social worker for S DOH needs.  Denies f/c/s, n/v/d, hemoptysis, PND, leg swelling Denies chest pain or edema    Allergies  Allergen Reactions   Bactrim  [Sulfamethoxazole -Trimethoprim ] Itching   Sulfa  Antibiotics Itching     There is no immunization history on file for this patient.  Tobacco History: Social History   Tobacco Use  Smoking Status Never  Smokeless Tobacco Never   Counseling given: Not Answered   Outpatient Encounter Medications as of 05/15/2024  Medication Sig   acetaminophen  (TYLENOL ) 500 MG tablet Take 500 mg by mouth every 6 (six) hours as needed.   albuterol (VENTOLIN HFA) 108 (90 Base) MCG/ACT inhaler Inhale 2 puffs into the lungs every 6 (six) hours as needed.   ibuprofen  (ADVIL ) 200 MG tablet Take 3 tablets (600 mg total) by mouth every 8 (eight) hours as needed for moderate pain. (Patient not taking: Reported on 09/14/2020)   methocarbamol  (ROBAXIN ) 500 MG tablet Take 1 tablet (500 mg total) by mouth 2 (two) times daily. (Patient not taking:  Reported on 05/15/2024)   naproxen  (NAPROSYN ) 500 MG tablet Take 1 tablet (500 mg total) by mouth 2 (two) times daily. (Patient not taking: Reported on 05/15/2024)   ofloxacin  (OCUFLOX ) 0.3 % ophthalmic solution Place 1 drop into the right eye 4 (four) times daily.   PRESCRIPTION MEDICATION Take 1 tablet by mouth daily. Birth control (Patient not taking: Reported on 12/06/2021)   No facility-administered encounter medications on file as of 05/15/2024.    Review of Systems  Review of Systems  Constitutional: Negative.   HENT: Negative.    Cardiovascular: Negative.   Gastrointestinal: Negative.   Allergic/Immunologic: Negative.   Neurological: Negative.   Psychiatric/Behavioral: Negative.       Objective:   BP 124/71   Pulse 76   Temp 98 F (36.7 C) (Oral)   Wt (!) 426 lb 9.6 oz (193.5 kg)   SpO2 96%   BMI 56.28 kg/m   Wt Readings from Last 5 Encounters:  05/15/24 (!) 426 lb 9.6 oz (193.5 kg)  07/25/23 (!) 420 lb (190.5 kg)  06/14/23 (!) 437 lb (198.2 kg)  05/20/23 (!) 478 lb 6.4 oz (217 kg)  12/05/21 (!) 480 lb (217.7 kg)     Physical Exam Vitals and nursing note reviewed.  Constitutional:      General: She is not in acute distress.    Appearance: She is well-developed.   Cardiovascular:  Rate and Rhythm: Normal rate and regular rhythm.  Pulmonary:     Effort: Pulmonary effort is normal.     Breath sounds: Normal breath sounds.   Neurological:     Mental Status: She is alert and oriented to person, place, and time.       Assessment & Plan:   Injury of toe on right foot, initial encounter -     DG Foot Complete Right  Routine adult health maintenance -     CBC -     Comprehensive metabolic panel with GFR  Lipid screening -     Lipid panel  Thyroid disorder screen -     TSH  Encounter for screening involving social determinants of health (SDoH) -     AMB Referral [facility]BCI Care Management     Return in about 3 months (around 08/15/2024).   Bascom GORMAN Borer, NP 05/15/2024

## 2024-05-16 ENCOUNTER — Telehealth: Payer: Self-pay

## 2024-05-16 LAB — COMPREHENSIVE METABOLIC PANEL WITH GFR
ALT: 8 IU/L (ref 0–32)
AST: 11 IU/L (ref 0–40)
Albumin: 4.1 g/dL (ref 3.9–4.9)
Alkaline Phosphatase: 69 IU/L (ref 44–121)
BUN/Creatinine Ratio: 15 (ref 9–23)
BUN: 11 mg/dL (ref 6–24)
Bilirubin Total: 0.4 mg/dL (ref 0.0–1.2)
CO2: 24 mmol/L (ref 20–29)
Calcium: 9.3 mg/dL (ref 8.7–10.2)
Chloride: 104 mmol/L (ref 96–106)
Creatinine, Ser: 0.74 mg/dL (ref 0.57–1.00)
Globulin, Total: 2.6 g/dL (ref 1.5–4.5)
Glucose: 93 mg/dL (ref 70–99)
Potassium: 4.3 mmol/L (ref 3.5–5.2)
Sodium: 143 mmol/L (ref 134–144)
Total Protein: 6.7 g/dL (ref 6.0–8.5)
eGFR: 102 mL/min/{1.73_m2} (ref >59)

## 2024-05-16 LAB — LIPID PANEL
Chol/HDL Ratio: 3.7 ratio (ref 0.0–4.4)
Cholesterol, Total: 178 mg/dL (ref 100–199)
HDL: 48 mg/dL (ref >39)
LDL Chol Calc (NIH): 107 mg/dL — ABNORMAL HIGH (ref 0–99)
Triglycerides: 127 mg/dL (ref 0–149)
VLDL Cholesterol Cal: 23 mg/dL (ref 5–40)

## 2024-05-16 LAB — CBC
Hematocrit: 40.6 % (ref 34.0–46.6)
Hemoglobin: 13.4 g/dL (ref 11.1–15.9)
MCH: 32.2 pg (ref 26.6–33.0)
MCHC: 33 g/dL (ref 31.5–35.7)
MCV: 98 fL — ABNORMAL HIGH (ref 79–97)
Platelets: 278 10*3/uL (ref 150–450)
RBC: 4.16 x10E6/uL (ref 3.77–5.28)
RDW: 12.8 % (ref 11.7–15.4)
WBC: 8.4 10*3/uL (ref 3.4–10.8)

## 2024-05-16 LAB — TSH: TSH: 1.46 u[IU]/mL (ref 0.450–4.500)

## 2024-05-16 NOTE — Progress Notes (Signed)
 Complex Care Management Note Care Guide Note  05/16/2024 Name: [name] MRN: [id number] DOB: 1981-04-30   Complex Care Management Outreach Attempts: An unsuccessful telephone outreach was attempted today to offer the patient information about available complex care management services.  Follow Up Plan:  Additional outreach attempts will be made to offer the patient complex care management information and services.   Encounter Outcome:  No Answer  Ashten Sarnowski [facility]  [facility] Guide Direct Dial: [phone]  Fax: [phone] Website: [email or web address]

## 2024-05-19 ENCOUNTER — Telehealth: Payer: Self-pay

## 2024-05-19 NOTE — Progress Notes (Signed)
 Complex Care Management Note Care Guide Note  05/19/2024 Name: [name] MRN: [id number] DOB: 11-Jun-1981  [name] is a 43 y.o. year old female who is a primary care patient of Oley Bascom RAMAN, NP . The community resource team was consulted for assistance with Food Insecurity and housing.  SDOH screenings and interventions completed:  Yes  Social Drivers of Health From This Encounter   Food Insecurity: Food Insecurity Present (05/19/2024)   Hunger Vital Sign    Worried About Running Out of Food in the Last Year: Sometimes true    Ran Out of Food in the Last Year: Sometimes true  Housing: High Risk (05/19/2024)   Housing Stability Vital Sign    Unable to Pay for Housing in the Last Year: Yes    Homeless in the Last Year: Yes  Financial Resource Strain: High Risk (05/19/2024)   Overall Financial Resource Strain (CARDIA)    Difficulty of Paying Living Expenses: Hard  Transportation Needs: No Transportation Needs (05/19/2024)   PRAPARE - Administrator, Civil Service (Medical): No    Lack of Transportation (Non-Medical): No  Utilities: Not At Risk (05/19/2024)   Utilities    Threatened with loss of utilities: No    SDOH Interventions Today    Flowsheet Row Most Recent Value  SDOH Interventions   Food Insecurity Interventions Other (Comment)  [Emailed food pantry list for [facility] and [employer] application assistance.]  Housing Interventions Other (Comment)  [Emailed housing resources for [employer]. [employer], [employer], Corporate treasurer and [employer] [facility] [employer].]     Care guide performed the following interventions: Patient provided with information about care guide support team and interviewed to confirm resource needs.  Follow Up Plan:  No further follow up planned at this time. The patient has been provided with needed resources.  Encounter Outcome:  Patient Visit  Completed  Marquon Alcala [facility]  [facility] Guide Direct Dial: [phone]  Fax: [phone] Website: [email or web address]

## 2024-05-21 ENCOUNTER — Other Ambulatory Visit: Payer: Self-pay | Admitting: Nurse Practitioner

## 2024-05-21 ENCOUNTER — Encounter: Payer: Self-pay | Admitting: Family

## 2024-05-21 ENCOUNTER — Ambulatory Visit: Admitting: Family

## 2024-05-21 DIAGNOSIS — M25571 Pain in right ankle and joints of right foot: Secondary | ICD-10-CM | POA: Insufficient documentation

## 2024-05-21 MED ORDER — IBUPROFEN 600 MG PO TABS
600.0000 mg | ORAL_TABLET | Freq: Three times a day (TID) | ORAL | 0 refills | Status: DC | PRN
Start: 2024-05-21 — End: 2024-10-14

## 2024-05-21 NOTE — Progress Notes (Signed)
 Office Visit Note   Patient: [name]           Date of Birth: 08-26-1981           MRN: [id number] Visit Date: 05/21/2024              Requested by: Oley Bascom RAMAN, NP [phone] [address] [place],  [place] [address] PCP: Oley Bascom RAMAN, NP  Chief Complaint  Patient presents with   Right Foot - Pain      HPI: The patient is a 43 year old woman who is seen today for evaluation of injury to the fifth toe right foot she stubbed it on a chair leg last Tuesday she did present to the emergency department radiographs were performed which showed fracture  Today she is in slide on sandals to full weightbearing  Assessment & Plan: Visit Diagnoses: No diagnosis found.  Plan: Recommended a stiff soled walking shoe provided a postop shoe today patient will follow-up in office in 2 weeks with repeat radiographs  Follow-Up Instructions: No follow-ups on file.   Ortho Exam  Patient is alert, oriented, no adenopathy, well-dressed, normal affect, normal respiratory effort. On examination right foot the toe is straight there is no edema no ecchymosis.    Does have tenderness to palpation.    Imaging: No results found. No images are attached to the encounter.  Labs: Lab Results  Component Value Date   REPTSTATUS 12/07/2021 FINAL 12/05/2021   GRAMSTAIN NO WBC SEEN NO ORGANISMS SEEN  04/11/2018   CULT (A) 12/05/2021    >=100,000 COLONIES/mL GROUP B STREP(S.AGALACTIAE)ISOLATED TESTING AGAINST S. AGALACTIAE NOT ROUTINELY PERFORMED DUE TO PREDICTABILITY OF AMP/PEN/VAN SUSCEPTIBILITY. Performed at [facility], [address] [place], [place] [address]    LABORGA METHICILLIN RESISTANT STAPHYLOCOCCUS AUREUS 04/08/2018     Lab Results  Component Value Date   ALBUMIN 4.1 05/15/2024   ALBUMIN 3.5 12/05/2021   ALBUMIN 3.9 09/14/2020    No results found for: MG No results found for: VD25OH  No results found for: PREALBUMIN    Latest Ref Rng & Units  05/15/2024   10:04 AM 07/25/2023   10:09 PM 12/05/2021    4:01 PM  CBC EXTENDED  WBC 3.4 - 10.8 x10E3/uL 8.4  9.4  22.4   RBC 3.77 - 5.28 x10E6/uL 4.16  3.89  3.67   Hemoglobin 11.1 - 15.9 g/dL 86.5  89.2  88.1   HCT 34.0 - 46.6 % 40.6  35.3  36.2   Platelets 150 - 450 x10E3/uL 278  293  317      There is no height or weight on file to calculate BMI.  Orders:  No orders of the defined types were placed in this encounter.  No orders of the defined types were placed in this encounter.    Procedures: No procedures performed  Clinical Data: No additional findings.  ROS:  All other systems negative, except as noted in the HPI. Review of Systems  Objective: Vital Signs: There were no vitals taken for this visit.  Specialty Comments:  No specialty comments available.  PMFS History: Patient Active Problem List   Diagnosis Date Noted   Arthralgia of toe of right foot 05/21/2024   HTN (hypertension) 04/11/2018   Lymphedema of lower extremity 04/11/2018   Morbid obesity with BMI of 50.0-59.9, adult (HCC) 04/11/2018   Cellulitis 04/11/2018   Multiple open wounds of left lower extremity 09/01/2017   Papular rash, localized 09/01/2017   Sepsis (  HCC) 09/01/2015   Lymphedema 09/01/2015   Cellulitis of right lower extremity 09/01/2015   Past Medical History:  Diagnosis Date   Hypertension    just today (08/30/2017)   Lymphedema of both lower extremities    Morbid obesity (HCC)     Family History  Problem Relation Age of Onset   Renal Disease Mother     Past Surgical History:  Procedure Laterality Date   NO PAST SURGERIES     Social History   Occupational History   Not on file  Tobacco Use   Smoking status: Never   Smokeless tobacco: Never  Vaping Use   Vaping status: Never Used  Substance and Sexual Activity   Alcohol use: No   Drug use: No   Sexual activity: Not Currently    Birth control/protection: None

## 2024-05-31 DIAGNOSIS — Z419 Encounter for procedure for purposes other than remedying health state, unspecified: Secondary | ICD-10-CM | POA: Diagnosis not present

## 2024-06-18 ENCOUNTER — Emergency Department (HOSPITAL_COMMUNITY)

## 2024-06-18 ENCOUNTER — Encounter (HOSPITAL_COMMUNITY): Payer: Self-pay

## 2024-06-18 ENCOUNTER — Other Ambulatory Visit: Payer: Self-pay

## 2024-06-18 ENCOUNTER — Emergency Department (HOSPITAL_COMMUNITY)
Admission: EM | Admit: 2024-06-18 | Discharge: 2024-06-19 | Disposition: A | Discharge status: Home / Self Care | Attending: Student | Admitting: Student

## 2024-06-18 DIAGNOSIS — N939 Abnormal uterine and vaginal bleeding, unspecified: Secondary | ICD-10-CM | POA: Diagnosis not present

## 2024-06-18 DIAGNOSIS — R102 Pelvic and perineal pain: Secondary | ICD-10-CM | POA: Diagnosis not present

## 2024-06-18 DIAGNOSIS — I10 Essential (primary) hypertension: Secondary | ICD-10-CM | POA: Diagnosis not present

## 2024-06-18 DIAGNOSIS — R58 Hemorrhage, not elsewhere classified: Secondary | ICD-10-CM | POA: Diagnosis not present

## 2024-06-18 DIAGNOSIS — N83291 Other ovarian cyst, right side: Secondary | ICD-10-CM | POA: Diagnosis not present

## 2024-06-18 DIAGNOSIS — R1084 Generalized abdominal pain: Secondary | ICD-10-CM | POA: Diagnosis not present

## 2024-06-18 LAB — COMPREHENSIVE METABOLIC PANEL WITH GFR
ALT: 10 U/L (ref 0–44)
AST: 17 U/L (ref 15–41)
Albumin: 3.6 g/dL (ref 3.5–5.0)
Alkaline Phosphatase: 60 U/L (ref 38–126)
Anion gap: 10 (ref 5–15)
BUN: 11 mg/dL (ref 6–20)
CO2: 23 mmol/L (ref 22–32)
Calcium: 9.2 mg/dL (ref 8.9–10.3)
Chloride: 108 mmol/L (ref 98–111)
Creatinine, Ser: 0.76 mg/dL (ref 0.44–1.00)
GFR, Estimated: >60 mL/min (ref >60)
Glucose, Bld: 115 mg/dL — ABNORMAL HIGH (ref 70–99)
Potassium: 3.4 mmol/L — ABNORMAL LOW (ref 3.5–5.1)
Sodium: 141 mmol/L (ref 135–145)
Total Bilirubin: 0.6 mg/dL (ref 0.0–1.2)
Total Protein: 7.1 g/dL (ref 6.5–8.1)

## 2024-06-18 LAB — WET PREP, GENITAL
Clue Cells Wet Prep HPF POC: NONE SEEN
Sperm: NONE SEEN
Trich, Wet Prep: NONE SEEN
WBC, Wet Prep HPF POC: <10 (ref <10)
Yeast Wet Prep HPF POC: NONE SEEN

## 2024-06-18 LAB — CBC WITH DIFFERENTIAL/PLATELET
Abs Immature Granulocytes: 0.03 K/uL (ref 0.00–0.07)
Basophils Absolute: 0 K/uL (ref 0.0–0.1)
Basophils Relative: 0 %
Eosinophils Absolute: 0.1 K/uL (ref 0.0–0.5)
Eosinophils Relative: 1 %
HCT: 38.5 % (ref 36.0–46.0)
Hemoglobin: 12.8 g/dL (ref 12.0–15.0)
Immature Granulocytes: 0 %
Lymphocytes Relative: 22 %
Lymphs Abs: 2.6 K/uL (ref 0.7–4.0)
MCH: 32 pg (ref 26.0–34.0)
MCHC: 33.2 g/dL (ref 30.0–36.0)
MCV: 96.3 fL (ref 80.0–100.0)
Monocytes Absolute: 0.6 K/uL (ref 0.1–1.0)
Monocytes Relative: 5 %
Neutro Abs: 8.3 K/uL — ABNORMAL HIGH (ref 1.7–7.7)
Neutrophils Relative %: 72 %
Platelets: 272 K/uL (ref 150–400)
RBC: 4 MIL/uL (ref 3.87–5.11)
RDW: 13 % (ref 11.5–15.5)
WBC: 11.7 K/uL — ABNORMAL HIGH (ref 4.0–10.5)
nRBC: 0 % (ref 0.0–0.2)

## 2024-06-18 LAB — HCG, SERUM, QUALITATIVE: Preg, Serum: NEGATIVE

## 2024-06-18 NOTE — ED Notes (Signed)
PA @ bedside for pelvic exam

## 2024-06-18 NOTE — Discharge Instructions (Addendum)
 Thank you for letting us  evaluate you today.  Your ultrasound did not show any torsion of ovaries nor gynecological abnormalities.  Will place you on medicine to make you stop bleeding and have you follow-up with GYN  Return to emergency department if you experience lightheadedness, dizziness, worsening symptoms however bleeding should stop within the next 24-48 hours

## 2024-06-18 NOTE — ED Triage Notes (Signed)
 PT BIB [facility] c/o of vaginal bleeding x20 minutes, passed 10 large blood clots per EMS in the shower. Not currently on cycle, lower abdominal pain.  BP 136 palp  HR110 20RR 98% RA

## 2024-06-18 NOTE — ED Provider Notes (Signed)
 [facility] EMERGENCY DEPARTMENT AT [facility] Provider Note   CSN: [id number] Arrival date & time: 06/18/24  2040     Patient presents with: Vaginal Bleeding   [name] is a 43 y.o. female with past medical history of sepsis, HTN, bilateral lymphedema, BMI 55 presents to Emergency Department via EMS for evaluation of vaginal bleeding for the past 20 minutes.  Reports that her cycle is very irregular and she may go 2 months without having a cycle and have a cycle up to 1 month.  Her last period was 2 months ago but is unsure when her next period should be.  Reports that she had an episode of right-sided lower abdominal pain described as cramping but this has subsided.  Sought ED evaluation as she normally does not have blood clots with her period. Is not on ocp. Denies urinary symptoms, back symptoms, N/V, fevers, dizziness, lightheadedness  {Add pertinent medical, surgical, social history, OB history to YEP:67052}  Vaginal Bleeding      Prior to Admission medications   Medication Sig Start Date End Date Taking? Authorizing Provider  acetaminophen  (TYLENOL ) 500 MG tablet Take 500 mg by mouth every 6 (six) hours as needed.    [provider]  albuterol (VENTOLIN HFA) 108 (90 Base) MCG/ACT inhaler Inhale 2 puffs into the lungs every 6 (six) hours as needed. 06/11/21   [provider]  ibuprofen  (ADVIL ) 600 MG tablet Take 1 tablet (600 mg total) by mouth every 8 (eight) hours as needed. 05/21/24   Paseda, Folashade R, FNP  methocarbamol  (ROBAXIN ) 500 MG tablet Take 1 tablet (500 mg total) by mouth 2 (two) times daily. Patient not taking: Reported on 05/15/2024 07/26/23   Odell Balls, PA-C  naproxen  (NAPROSYN ) 500 MG tablet Take 1 tablet (500 mg total) by mouth 2 (two) times daily. Patient not taking: Reported on 05/15/2024 07/26/23   Odell Balls, PA-C  ofloxacin  (OCUFLOX ) 0.3 % ophthalmic solution Place 1 drop into the right eye 4 (four) times daily. 09/14/22    Raspet, Erin K, PA-C  PRESCRIPTION MEDICATION Take 1 tablet by mouth daily. Birth control Patient not taking: Reported on 12/06/2021    [provider]    Allergies: Bactrim  [sulfamethoxazole -trimethoprim ] and Sulfa  antibiotics    Review of Systems  Genitourinary:  Positive for vaginal bleeding.    Updated Vital Signs BP (!) 159/95 (BP Location: Right Arm)   Pulse (!) 110   Temp 98.5 F (36.9 C) (Oral)   Resp 18   Ht 6' 1 (1.854 m)   Wt (!) 190.5 kg   SpO2 98%   BMI 55.41 kg/m   Physical Exam Vitals and nursing note reviewed. Exam conducted with a chaperone present.  Constitutional:      General: She is not in acute distress.    Appearance: Normal appearance.  HENT:     Head: Normocephalic and atraumatic.  Eyes:     Conjunctiva/sclera: Conjunctivae normal.  Cardiovascular:     Rate and Rhythm: Normal rate.  Pulmonary:     Effort: Pulmonary effort is normal. No respiratory distress.  Genitourinary:    General: Normal vulva.     Exam position: Knee-chest position.     Vagina: Bleeding present.     Comments: Bleeding in vaginal vault with large clots. Unable to visualize cervix 2/2 positioning, body habitus Skin:    Coloration: Skin is not jaundiced or pale.  Neurological:     Mental Status: She is alert. Mental status is at baseline.  GU exam performed with Recardo Glaser RN  (all labs ordered are listed, but only abnormal results are displayed) Labs Reviewed - No data to display  EKG: None  Radiology: No results found.   Medications Ordered in the ED - No data to display    {Click here for ABCD2, HEART and other calculators REFRESH Note before signing:1}                              Medical Decision Making Amount and/or Complexity of Data Reviewed Labs: ordered. Radiology: ordered.   Patient presents to the ED for concern of vaginal bleeding, this involves an extensive number of treatment options, and is a complaint that carries with it a  high risk of complications and morbidity.  The differential diagnosis includes ectopic pregnancy, miscarriage, ovarian cyst rupture,  menses, torsion   Co morbidities that complicate the patient evaluation  BMI 55 Completely irregular menses   Additional history obtained:  Additional history obtained from Nursing   External records from outside source obtained and reviewed including triage RN note   Lab Tests:  I Ordered, and personally interpreted labs.  The pertinent results include:  ***   Imaging Studies ordered:  I ordered imaging studies including ***  I independently visualized and interpreted imaging which showed *** I agree with the radiologist interpretation   Cardiac Monitoring:  The patient was maintained on a cardiac monitor.  I personally viewed and interpreted the cardiac monitored which showed an underlying rhythm of: ***   Medicines ordered and prescription drug management:  I ordered medication including ***  for ***  Reevaluation of the patient after these medicines showed that the patient {resolved/improved/worsened:23923::improved} I have reviewed the patients home medicines and have made adjustments as needed    Consultations Obtained:  I requested consultation with the ***,  and discussed lab and imaging findings as well as pertinent plan - they recommend: ***   Problem List / ED Course:  Vaginal bleeding   Reevaluation:  After the interventions noted above, I reevaluated the patient and found that they have :{resolved/improved/worsened:23923::improved}   Social Determinants of Health:  Has upcoming pcp appointment No GYN   Dispostion:  After consideration of the diagnostic results and the patients response to treatment, I feel that the patent would benefit from ***.    {Document critical care time when appropriate  Document review of labs and clinical decision tools ie CHADS2VASC2, etc  Document your independent review  of radiology images and any outside records  Document your discussion with family members, caretakers and with consultants  Document social determinants of health affecting pt's care  Document your decision making why or why not admission, treatments were needed:32947:::1}   Final diagnoses:  None    ED Discharge Orders     None

## 2024-06-18 NOTE — ED Notes (Signed)
 Pt ambulated to bed from bedroom door. Tolerated well.

## 2024-06-19 LAB — GC/CHLAMYDIA PROBE AMP (~~LOC~~) NOT AT ARMC
Chlamydia: NEGATIVE
Comment: NEGATIVE
Comment: NORMAL
Neisseria Gonorrhea: NEGATIVE

## 2024-06-19 MED ORDER — TRANEXAMIC ACID 650 MG PO TABS: 1300.0000 mg | ORAL_TABLET | Freq: Three times a day (TID) | ORAL | 0 refills | Status: AC

## 2024-06-19 MED ORDER — MEGESTROL ACETATE 40 MG PO TABS
40.0000 mg | ORAL_TABLET | Freq: Once | ORAL | Status: AC
  Administered 2024-06-19: 40 mg via ORAL
  Filled 2024-06-19: qty 1

## 2024-06-19 MED ORDER — STERILE WATER FOR INJECTION IJ SOLN
INTRAMUSCULAR | Status: AC
Start: 2024-06-19 — End: 2024-06-19
  Administered 2024-06-19: 10 mL
  Filled 2024-06-19: qty 10

## 2024-06-19 MED ORDER — ESTROGENS CONJUGATED 25 MG IJ SOLR
25.0000 mg | Freq: Once | INTRAMUSCULAR | Status: AC
  Administered 2024-06-19: 25 mg via INTRAVENOUS
  Filled 2024-06-19: qty 25

## 2024-07-01 DIAGNOSIS — Z419 Encounter for procedure for purposes other than remedying health state, unspecified: Secondary | ICD-10-CM | POA: Diagnosis not present

## 2024-07-08 ENCOUNTER — Other Ambulatory Visit (HOSPITAL_COMMUNITY)
Admission: RE | Admit: 2024-07-08 | Discharge: 2024-07-08 | Disposition: A | Discharge status: Home / Self Care | Source: Ambulatory Visit | Attending: Obstetrics | Admitting: Obstetrics

## 2024-07-08 ENCOUNTER — Ambulatory Visit (INDEPENDENT_AMBULATORY_CARE_PROVIDER_SITE_OTHER): Admitting: Obstetrics

## 2024-07-08 ENCOUNTER — Encounter: Payer: Self-pay | Admitting: Obstetrics

## 2024-07-08 VITALS — BP 132/89 | HR 88 | Ht 74.0 in | Wt >= 6400 oz

## 2024-07-08 DIAGNOSIS — Z6841 Body Mass Index (BMI) 40.0 and over, adult: Secondary | ICD-10-CM

## 2024-07-08 DIAGNOSIS — Z1239 Encounter for other screening for malignant neoplasm of breast: Secondary | ICD-10-CM

## 2024-07-08 DIAGNOSIS — E876 Hypokalemia: Secondary | ICD-10-CM

## 2024-07-08 DIAGNOSIS — N939 Abnormal uterine and vaginal bleeding, unspecified: Secondary | ICD-10-CM

## 2024-07-08 DIAGNOSIS — E66813 Obesity, class 3: Secondary | ICD-10-CM

## 2024-07-08 DIAGNOSIS — Z01419 Encounter for gynecological examination (general) (routine) without abnormal findings: Secondary | ICD-10-CM | POA: Insufficient documentation

## 2024-07-08 MED ORDER — POTASSIUM CHLORIDE CRYS ER 20 MEQ PO TBCR: 20.0000 meq | EXTENDED_RELEASE_TABLET | Freq: Every day | ORAL | 0 refills | Status: DC

## 2024-07-08 MED ORDER — MEGESTROL ACETATE 40 MG PO TABS
80.0000 mg | ORAL_TABLET | Freq: Two times a day (BID) | ORAL | 0 refills | Status: DC
Start: 2024-07-08 — End: 2024-07-28

## 2024-07-08 NOTE — Progress Notes (Signed)
 Patient ID: [name], female   DOB: 01-22-1981, 43 y.o.   MRN: [id number]  Chief Complaint  Patient presents with   Vaginal Bleeding    HPI [name] is a 43 y.o. female.  43 year old G0, LMP 06/17/2024.  Presents in follow up from ER for AUB.  Past gyn history is significant for heavy, irregular and 2-4 week cycles with clots when she does have a period.  She experienced right sided lower abdominal pain with her last cycle, which is what brought her to the ER.  Last pap smear:  unknown.  Last mammogram:  none.  PMH significant for HTN, lymphedema of both legs and morbid obesity. HPI  Past Medical History:  Diagnosis Date   Hypertension    just today (08/30/2017)   Lymphedema of both lower extremities    Morbid obesity (HCC)     Past Surgical History:  Procedure Laterality Date   NO PAST SURGERIES      Family History  Problem Relation Age of Onset   Renal Disease Mother     Social History Social History   Tobacco Use   Smoking status: Never   Smokeless tobacco: Never  Vaping Use   Vaping status: Never Used  Substance Use Topics   Alcohol use: No   Drug use: No    Allergies  Allergen Reactions   Bactrim  [Sulfamethoxazole -Trimethoprim ] Itching   Sulfa  Antibiotics Itching    Current Outpatient Medications  Medication Sig Dispense Refill   acetaminophen  (TYLENOL ) 500 MG tablet Take 500 mg by mouth every 6 (six) hours as needed.     albuterol (VENTOLIN HFA) 108 (90 Base) MCG/ACT inhaler Inhale 2 puffs into the lungs every 6 (six) hours as needed.     ibuprofen  (ADVIL ) 600 MG tablet Take 1 tablet (600 mg total) by mouth every 8 (eight) hours as needed. 30 tablet 0   megestrol  (MEGACE ) 40 MG tablet Take 2 tablets (80 mg total) by mouth 2 (two) times daily. 60 tablet 0   potassium chloride  SA (KLOR-CON  M) 20 MEQ tablet Take 1 tablet (20 mEq total) by mouth daily. 30 tablet 0   methocarbamol  (ROBAXIN ) 500 MG tablet Take 1 tablet (500 mg total) by mouth 2 (two)  times daily. (Patient not taking: Reported on 07/08/2024) 20 tablet 0   naproxen  (NAPROSYN ) 500 MG tablet Take 1 tablet (500 mg total) by mouth 2 (two) times daily. (Patient not taking: Reported on 07/08/2024) 30 tablet 0   ofloxacin  (OCUFLOX ) 0.3 % ophthalmic solution Place 1 drop into the right eye 4 (four) times daily. (Patient not taking: Reported on 07/08/2024) 5 mL 0   PRESCRIPTION MEDICATION Take 1 tablet by mouth daily. Birth control (Patient not taking: Reported on 12/06/2021)     No current facility-administered medications for this visit.    Review of Systems Review of Systems Constitutional: negative for fatigue and weight loss Respiratory: negative for cough and wheezing Cardiovascular: negative for chest pain, fatigue and palpitations Gastrointestinal: negative for abdominal pain and change in bowel habits Genitourinary: positive for irregular, heavy and prolonged periods Integument/breast: negative for nipple discharge Musculoskeletal:negative for myalgias Neurological: negative for gait problems and tremors Behavioral/Psych: negative for abusive relationship, depression Endocrine: negative for temperature intolerance      Blood pressure 132/89, pulse 88, height 6' 2 (1.88 m), weight (!) 434 lb 1.6 oz (196.9 kg), last menstrual period 06/17/2024.  Physical Exam Physical Exam General:   Alert and no distress  Skin:   no  rash or abnormalities  Lungs:   clear to auscultation bilaterally  Heart:   regular rate and rhythm, S1, S2 normal, no murmur, click, rub or gallop  Breasts:   normal without suspicious masses, skin or nipple changes or axillary nodes  Abdomen:  normal findings: no organomegaly, soft, non-tender and no hernia  Pelvis:  External genitalia: normal general appearance Urinary system: urethral meatus normal and bladder without fullness, nontender Vaginal: normal without tenderness, induration or masses Cervix: normal appearance Adnexa: could not appreciate due  to obesity Uterus: could not appreciate due to obesity    I have spent a total of 25 minutes of face-to-face time, excluding clinical staff time, reviewing notes and preparing to see patient, ordering tests and/or medications, and counseling the patient.   Data Reviewed Labs Wet Prep and Cultures Ultrasound  EKG    US  Transvaginal Non-OB (Accession [id number]) (Order [id number]) Imaging Date: 06/18/2024 Department: [facility] Released By/Authorizing: Minnie Tinnie BRAVO, PA (auto-released)   Exam Status  Status  Final [99]   PACS Intelerad Image Link   Show images for US  Transvaginal Non-OB Related Results          US  Pelvis Complete Final result 06/18/2024 11:13 PM      CLINICAL DATA:  Pelvic pain and vaginal bleeding, initial encounter  EXAM: TRANSABDOMINAL AND TRANSVAGINAL ULTRASOUND OF PELVIS  DOPPLER ULTRASOUND OF OVARIES  TECHNIQUE: Both transabdominal and transvaginal ultrasound examinations of the pelvis were performed. Transabdominal technique was performed for ...     US  Art/Ven Flow Abd Pelv Doppler Final result 06/18/2024 11:13 PM      CLINICAL DATA:  Pelvic pain and vaginal bleeding, initial encounter  EXAM: TRANSABDOMINAL AND TRANSVAGINAL ULTRASOUND OF PELVIS  DOPPLER ULTRASOUND OF OVARIES  TECHNIQUE: Both transabdominal and transvaginal ultrasound examinations of the pelvis were performed. Transabdominal technique was performed for ...   Study Result  Narrative & Impression  CLINICAL DATA:  Pelvic pain and vaginal bleeding, initial encounter   EXAM: TRANSABDOMINAL AND TRANSVAGINAL ULTRASOUND OF PELVIS   DOPPLER ULTRASOUND OF OVARIES   TECHNIQUE: Both transabdominal and transvaginal ultrasound examinations of the pelvis were performed. Transabdominal technique was performed for global imaging of the pelvis including uterus, ovaries, adnexal regions, and pelvic cul-de-sac.   It was necessary to  proceed with endovaginal exam following the transabdominal exam to visualize the ovaries. Color and duplex Doppler ultrasound was utilized to evaluate blood flow to the ovaries.   COMPARISON:  CT from 07/26/2023   FINDINGS: Uterus   Measurements: 14.0 x 8.6 x 8.9 cm. = volume: 564 mL. No fibroids or other mass visualized.   Endometrium   Thickness: 14 mm.  No focal abnormality visualized.   Right ovary   Measurements: 3.4 x 3.5 x 4.4 cm. = volume: 27 mL. 3.5 cm simple appearing cyst is noted within the right ovary.   Left ovary   Measurements: 3.2 x 2.1 x 1.9 cm. = volume: 6.6 mL. Normal appearance/no adnexal mass.   Pulsed Doppler evaluation of both ovaries demonstrates normal low-resistance arterial and venous waveforms on the right. Doppler evaluation of the left ovary was limited due to poor acoustical window.   Other findings   No abnormal free fluid.   IMPRESSION: Right ovarian benign functional cyst measuring 3.5 cm. No follow-up imaging is recommended. Reference: Radiology 2019 Nov;293(2):359-371   Normal vascularity within the right ovary. Doppler evaluation of the left ovary was limited due the patient's body habitus. The left ovary otherwise  appears within normal limits. Correlate with physical exam.     Electronically Signed   By: Oneil Devonshire M.D.   On: 06/18/2024 23:25       Assessment     1. Encounter for gynecological examination with Papanicolaou smear of cervix (Primary) Rx: - Cytology - PAP( [place])  2. Abnormal uterine bleeding (AUB) Rx: - Ferritin - megestrol  (MEGACE ) 40 MG tablet; Take 2 tablets (80 mg total) by mouth 2 (two) times daily.  Dispense: 60 tablet; Refill: 0  3. Screening breast examination Rx: - MM Digital Screening; Future  4. Class 3 severe obesity due to excess calories with serious comorbidity and body mass index (BMI) of 50.0 to 59.9 in adult Rx: - TSH - Cholesterol, total - Triglycerides - HDL  cholesterol - Hemoglobin A1c - Direct LDL  5. Hypokalemia Rx: - potassium chloride  SA (KLOR-CON  M) 20 MEQ tablet; Take 1 tablet (20 mEq total) by mouth daily.  Dispense: 30 tablet; Refill: 0     Plan   Follow up in 2 weeks  Orders Placed This Encounter  Procedures   MM Digital Screening    Standing Status:   Future    Expiration Date:   07/08/2025    Reason for Exam (SYMPTOM  OR DIAGNOSIS REQUIRED):   Screening    Is the patient pregnant?:   No    Preferred imaging location?:   [facility]   TSH   Ferritin   Cholesterol, total   Triglycerides   HDL cholesterol   Hemoglobin A1c   Direct LDL   Meds ordered this encounter  Medications   megestrol  (MEGACE ) 40 MG tablet    Sig: Take 2 tablets (80 mg total) by mouth 2 (two) times daily.    Dispense:  60 tablet    Refill:  0   potassium chloride  SA (KLOR-CON  M) 20 MEQ tablet    Sig: Take 1 tablet (20 mEq total) by mouth daily.    Dispense:  30 tablet    Refill:  0     CARLIN RONAL CENTERS, MD, FACOG Attending Obstetrician & Gynecologist, [facility] for [facility], [facility], [place] 07/08/2024

## 2024-07-08 NOTE — Progress Notes (Signed)
 Pt presents for vaginal bleeding. Pt started bleeding on 7/29. Pt states that she started passing large clots, so went to the ed. Pt states that she has been bleeding off and on since. Ed put her on Tranexamic acid  for 5 days.

## 2024-07-09 LAB — LDL CHOLESTEROL, DIRECT: LDL Direct: 101 mg/dL — ABNORMAL HIGH (ref 0–99)

## 2024-07-09 LAB — HDL CHOLESTEROL: HDL: 50 mg/dL (ref >39)

## 2024-07-09 LAB — CHOLESTEROL, TOTAL: Cholesterol, Total: 164 mg/dL (ref 100–199)

## 2024-07-09 LAB — TRIGLYCERIDES: Triglycerides: 79 mg/dL (ref 0–149)

## 2024-07-09 LAB — HEMOGLOBIN A1C
Est. average glucose Bld gHb Est-mCnc: 91 mg/dL
Hgb A1c MFr Bld: 4.8 % (ref 4.8–5.6)

## 2024-07-09 LAB — FERRITIN: Ferritin: 32 ng/mL (ref 15–150)

## 2024-07-09 LAB — TSH: TSH: 1.47 u[IU]/mL (ref 0.450–4.500)

## 2024-07-10 LAB — CYTOLOGY - PAP
Comment: NEGATIVE
Comment: NEGATIVE
Comment: NEGATIVE
Diagnosis: UNDETERMINED — AB
HPV 16: NEGATIVE
HPV 18 / 45: NEGATIVE
High risk HPV: POSITIVE — AB

## 2024-07-11 ENCOUNTER — Other Ambulatory Visit: Payer: Self-pay

## 2024-07-11 ENCOUNTER — Telehealth: Payer: Self-pay | Admitting: Obstetrics

## 2024-07-11 ENCOUNTER — Ambulatory Visit: Payer: Self-pay | Admitting: Obstetrics

## 2024-07-11 MED ORDER — METRONIDAZOLE 500 MG PO TABS: 500.0000 mg | ORAL_TABLET | Freq: Two times a day (BID) | ORAL | 0 refills | Status: DC

## 2024-07-11 NOTE — Telephone Encounter (Signed)
 Contacted patient to schedule colpo procedure.

## 2024-07-17 ENCOUNTER — Emergency Department (HOSPITAL_BASED_OUTPATIENT_CLINIC_OR_DEPARTMENT_OTHER)

## 2024-07-17 ENCOUNTER — Other Ambulatory Visit: Payer: Self-pay

## 2024-07-17 ENCOUNTER — Other Ambulatory Visit (HOSPITAL_BASED_OUTPATIENT_CLINIC_OR_DEPARTMENT_OTHER): Payer: Self-pay

## 2024-07-17 ENCOUNTER — Emergency Department (HOSPITAL_BASED_OUTPATIENT_CLINIC_OR_DEPARTMENT_OTHER)
Admission: EM | Admit: 2024-07-17 | Discharge: 2024-07-17 | Disposition: A | Discharge status: Home / Self Care | Attending: Emergency Medicine | Admitting: Emergency Medicine

## 2024-07-17 ENCOUNTER — Encounter (HOSPITAL_BASED_OUTPATIENT_CLINIC_OR_DEPARTMENT_OTHER): Payer: Self-pay | Admitting: Emergency Medicine

## 2024-07-17 DIAGNOSIS — R2241 Localized swelling, mass and lump, right lower limb: Secondary | ICD-10-CM | POA: Diagnosis not present

## 2024-07-17 DIAGNOSIS — M7989 Other specified soft tissue disorders: Secondary | ICD-10-CM | POA: Insufficient documentation

## 2024-07-17 DIAGNOSIS — M25571 Pain in right ankle and joints of right foot: Secondary | ICD-10-CM | POA: Diagnosis not present

## 2024-07-17 DIAGNOSIS — M79604 Pain in right leg: Secondary | ICD-10-CM | POA: Diagnosis not present

## 2024-07-17 MED ORDER — CEPHALEXIN 500 MG PO CAPS
500.0000 mg | ORAL_CAPSULE | Freq: Four times a day (QID) | ORAL | 0 refills | Status: DC
  Filled 2024-07-17: qty 20, 5d supply, fill #0

## 2024-07-17 MED ORDER — CEPHALEXIN 500 MG PO CAPS: 500.0000 mg | ORAL_CAPSULE | Freq: Four times a day (QID) | ORAL | 0 refills | Status: DC

## 2024-07-17 NOTE — ED Triage Notes (Signed)
 Pt via pov from home with right leg pain upon awakening this morning. Pt has lymphedema, but states it is more swollen that usual. Pt has a hard, red spot on right calf. Denies SOB. Pt a&o x 4; nad noted.

## 2024-07-17 NOTE — Discharge Instructions (Signed)
 As we discussed, imaging of your legs are negative for any DVT.  I will give you Keflex  for any overlying cellulitis as the leg does appear warm.  Would like you to follow-up with your primary care doctor for further evaluation.  You may return to the emergency department for any worsening symptoms.

## 2024-07-17 NOTE — ED Provider Notes (Signed)
 [place] EMERGENCY DEPARTMENT AT [facility] Provider Note   CSN: [id number] Arrival date & time: 07/17/24  1257     Patient presents with: Leg Pain   [name] is a 43 y.o. female patient who presents to the Emergency Department today for further evaluation of right lower extremity swelling, pain, and warmth.  Patient has lymphedema and does have swollen legs at baseline.  She states that she woke up and she had some right posterior leg pain, swelling, and warmth to the area.  No systemic fever, chills, chest pain, shortness of breath.    Leg Pain      Prior to Admission medications   Medication Sig Start Date End Date Taking? Authorizing Provider  cephALEXin  (KEFLEX ) 500 MG capsule Take 1 capsule (500 mg total) by mouth 4 (four) times daily. 07/17/24  Yes Theotis, Gavinn Collard M, PA-C  acetaminophen  (TYLENOL ) 500 MG tablet Take 500 mg by mouth every 6 (six) hours as needed.    [provider]  albuterol (VENTOLIN HFA) 108 (90 Base) MCG/ACT inhaler Inhale 2 puffs into the lungs every 6 (six) hours as needed. 06/11/21   [provider]  ibuprofen  (ADVIL ) 600 MG tablet Take 1 tablet (600 mg total) by mouth every 8 (eight) hours as needed. 05/21/24   Paseda, Folashade R, FNP  megestrol  (MEGACE ) 40 MG tablet Take 2 tablets (80 mg total) by mouth 2 (two) times daily. 07/08/24   Rudy Carlin LABOR, MD  methocarbamol  (ROBAXIN ) 500 MG tablet Take 1 tablet (500 mg total) by mouth 2 (two) times daily. Patient not taking: Reported on 07/08/2024 07/26/23   Odell Balls, PA-C  metroNIDAZOLE  (FLAGYL ) 500 MG tablet Take 1 tablet (500 mg total) by mouth 2 (two) times daily. 07/11/24   Ajewole, Christana, MD  naproxen  (NAPROSYN ) 500 MG tablet Take 1 tablet (500 mg total) by mouth 2 (two) times daily. Patient not taking: Reported on 07/08/2024 07/26/23   Odell Balls, PA-C  ofloxacin  (OCUFLOX ) 0.3 % ophthalmic solution Place 1 drop into the right eye 4 (four) times daily. Patient  not taking: Reported on 07/08/2024 09/14/22   Raspet, Erin K, PA-C  potassium chloride  SA (KLOR-CON  M) 20 MEQ tablet Take 1 tablet (20 mEq total) by mouth daily. 07/08/24   Rudy Carlin LABOR, MD    Allergies: Bactrim  [sulfamethoxazole -trimethoprim ] and Sulfa  antibiotics    Review of Systems  All other systems reviewed and are negative.   Updated Vital Signs BP 114/63 (BP Location: Right Arm)   Pulse 80   Temp 98.4 F (36.9 C) (Oral)   Resp 18   Ht 6' 2 (1.88 m)   Wt (!) 190.5 kg   LMP 06/17/2024 (Approximate)   SpO2 97%   BMI 53.92 kg/m   Physical Exam Vitals and nursing note reviewed.  Constitutional:      Appearance: Normal appearance.  HENT:     Head: Normocephalic and atraumatic.  Eyes:     General:        Right eye: No discharge.        Left eye: No discharge.     Conjunctiva/sclera: Conjunctivae normal.  Pulmonary:     Effort: Pulmonary effort is normal.  Musculoskeletal:     Comments: Lymphadema to bilateral lower extremities. There is some worsening swelling, warmth, and tenderness inferior to the right lower calf.   Skin:    General: Skin is warm and dry.     Findings: No rash.  Neurological:     General: No focal  deficit present.     Mental Status: She is alert.  Psychiatric:        Mood and Affect: Mood normal.        Behavior: Behavior normal.     (all labs ordered are listed, but only abnormal results are displayed) Labs Reviewed - No data to display  EKG: None  Radiology: US  RT LOWER EXTREM LTD SOFT TISSUE NON VASCULAR Result Date: 07/17/2024 CLINICAL DATA:  Right posterior ankle pain. EXAM: ULTRASOUND right LOWER EXTREMITY LIMITED TECHNIQUE: Ultrasound examination of the lower extremity soft tissues was performed in the area of clinical concern. COMPARISON:  None Available. FINDINGS: Targeted sonographic images of the posterior right ankle in the area of clinical concern was performed. Dilated varicose veins noted. No drainable fluid collection  or abscess. IMPRESSION: Varicose veins. Electronically Signed   By: Vanetta Chou M.D.   On: 07/17/2024 15:29   US  Venous Img Lower Right (DVT Study) Result Date: 07/17/2024 CLINICAL DATA:  Right lower extremity pain EXAM: RIGHT LOWER EXTREMITY VENOUS DOPPLER ULTRASOUND TECHNIQUE: Gray-scale sonography with compression, as well as color and duplex ultrasound, were performed to evaluate the deep venous system(s) from the level of the common femoral vein through the popliteal and proximal calf veins. COMPARISON:  None Available. FINDINGS: VENOUS Normal compressibility of the common femoral, superficial femoral, and popliteal veins, as well as the visualized calf veins. Visualized portions of profunda femoral vein and great saphenous vein unremarkable. No filling defects to suggest DVT on grayscale or color Doppler imaging. Doppler waveforms show normal direction of venous flow, normal respiratory plasticity and response to augmentation. Limited views of the contralateral common femoral vein are unremarkable. OTHER None. Limitations: Quite poor visualization of the calf veins due to patient body habitus. No compelling evidence of DVT. IMPRESSION: No evidence of acute DVT in the right lower extremity. Electronically Signed   By: Wilkie Lent M.D.   On: 07/17/2024 15:12     Procedures   Medications Ordered in the ED - No data to display   Medical Decision Making [name] is a 43 y.o. female patient who presents to the emerged apartment today for further evaluation of right lower leg swelling.  Will get an ultrasound to rule out any DVT in addition to soft tissue look for any underlying cellulitis.  Given the patient's extensive lymphedema is difficult to assess obvious fluctuation but no obvious fluctuation seen.  Leg is not actively draining.  Ultrasound was negative for DVT and soft tissue does reveal some varicosities.  The swelling could be just a swollen varicose vein.  However, given  that it is swollen and warm to palpation, I will plan to prescribe her Keflex  for the next 5 days for possible developing cellulitis.  Will have her follow-up with her primary care doctor.  Strict turn precautions were discussed.  She is safe for discharge.   Amount and/or Complexity of Data Reviewed Radiology: ordered.  Risk Prescription drug management.    Final diagnoses:  Leg swelling    ED Discharge Orders          Ordered    cephALEXin  (KEFLEX ) 500 MG capsule  4 times daily        07/17/24 1551               Theotis Peers [place], [place] 07/17/24 1555    Ruthe Cornet, DO 07/18/24 0700

## 2024-07-17 NOTE — ED Notes (Signed)
 DC paperwork given and verbally understood.

## 2024-07-22 ENCOUNTER — Ambulatory Visit

## 2024-07-28 ENCOUNTER — Other Ambulatory Visit (HOSPITAL_BASED_OUTPATIENT_CLINIC_OR_DEPARTMENT_OTHER): Payer: Self-pay

## 2024-07-28 ENCOUNTER — Other Ambulatory Visit (HOSPITAL_COMMUNITY)
Admission: RE | Admit: 2024-07-28 | Discharge: 2024-07-28 | Disposition: A | Discharge status: Home / Self Care | Source: Ambulatory Visit | Attending: Obstetrics and Gynecology | Admitting: Obstetrics and Gynecology

## 2024-07-28 ENCOUNTER — Ambulatory Visit (INDEPENDENT_AMBULATORY_CARE_PROVIDER_SITE_OTHER): Admitting: Obstetrics and Gynecology

## 2024-07-28 VITALS — BP 140/89 | HR 94 | Wt >= 6400 oz

## 2024-07-28 DIAGNOSIS — R87620 Atypical squamous cells of undetermined significance on cytologic smear of vagina (ASC-US): Secondary | ICD-10-CM | POA: Insufficient documentation

## 2024-07-28 DIAGNOSIS — R87810 Cervical high risk human papillomavirus (HPV) DNA test positive: Secondary | ICD-10-CM | POA: Diagnosis not present

## 2024-07-28 DIAGNOSIS — R87811 Vaginal high risk human papillomavirus (HPV) DNA test positive: Secondary | ICD-10-CM | POA: Diagnosis not present

## 2024-07-28 DIAGNOSIS — N939 Abnormal uterine and vaginal bleeding, unspecified: Secondary | ICD-10-CM | POA: Diagnosis not present

## 2024-07-28 DIAGNOSIS — R87610 Atypical squamous cells of undetermined significance on cytologic smear of cervix (ASC-US): Secondary | ICD-10-CM | POA: Diagnosis not present

## 2024-07-28 MED ORDER — MEGESTROL ACETATE 40 MG PO TABS: 80.0000 mg | ORAL_TABLET | Freq: Two times a day (BID) | ORAL | 3 refills | Status: DC

## 2024-07-28 NOTE — Progress Notes (Unsigned)
 Pt is in office for colposcopy for recent abnormal pap smear, ASCUS w/ HRHPV.  Pt states LMP 7/29 - has been spotting since, pt is taking Megace .

## 2024-07-28 NOTE — Progress Notes (Unsigned)
    GYNECOLOGY OFFICE COLPOSCOPY PROCEDURE NOTE  43 y.o. G0P0 here for colposcopy for ASCUS with POSITIVE high risk HPV pap smear on 07/08/24. Discussed role for HPV in cervical dysplasia, need for surveillance.  Patient gave informed written consent, time out was performed.  Placed in lithotomy position. Cervix viewed with speculum and colposcope after application of acetic acid.   Colposcopy adequate? No   Small, nulliparous cervix without any masses Cervix only briefly/intermittently visualized due to anterior position and continuous bleeding  Not able to visualize long enough to see any obvious acetowhite changes in addition to continuous slow ooze of blood from ox  no visible lesions; due to limited visualization, biopsies not obtained Attempted ECC specimen obtained. Significant bleeding after ECC from cervix and vaginal side wall, monsels applied All specimens were labeled and sent to pathology.  Chaperone was present during entire procedure.  Patient was given post procedure instructions.  Will follow up pathology and manage accordingly; patient will be contacted with results and recommendations.  Routine preventative health maintenance measures emphasized.  Refills for megace  sent to pharmacy. Noted that if insufficient sample obtained, would recommend repeat colposcopy with better visualization.    Carter Quarry, MD, FACOG Minimally Invasive Gynecologic Surgery  Obstetrics and Gynecology, [facility] for [facility], [facility] 07/28/2024

## 2024-07-30 ENCOUNTER — Ambulatory Visit: Payer: Self-pay | Admitting: Obstetrics and Gynecology

## 2024-07-30 ENCOUNTER — Ambulatory Visit
Admission: RE | Admit: 2024-07-30 | Discharge: 2024-07-30 | Disposition: A | Discharge status: Home / Self Care | Source: Ambulatory Visit | Attending: Obstetrics | Admitting: Obstetrics

## 2024-07-30 DIAGNOSIS — Z1239 Encounter for other screening for malignant neoplasm of breast: Secondary | ICD-10-CM

## 2024-07-30 LAB — SURGICAL PATHOLOGY

## 2024-08-01 ENCOUNTER — Ambulatory Visit (INDEPENDENT_AMBULATORY_CARE_PROVIDER_SITE_OTHER): Payer: Self-pay | Admitting: Nurse Practitioner

## 2024-08-01 ENCOUNTER — Encounter: Payer: Self-pay | Admitting: Nurse Practitioner

## 2024-08-01 VITALS — BP 132/81 | HR 90 | Ht 74.0 in | Wt >= 6400 oz

## 2024-08-01 DIAGNOSIS — I890 Lymphedema, not elsewhere classified: Secondary | ICD-10-CM | POA: Diagnosis not present

## 2024-08-01 DIAGNOSIS — Z419 Encounter for procedure for purposes other than remedying health state, unspecified: Secondary | ICD-10-CM | POA: Diagnosis not present

## 2024-08-01 NOTE — Progress Notes (Signed)
 Subjective   Patient ID: [name], female    DOB: [birth year], 43 y.o.   MRN: [id number]  Chief Complaint  Patient presents with   Hospitalization Follow-up    Referring provider: Oley Bascom RAMAN, NP  [name] is a 43 y.o. female with Past Medical History: No date: Hypertension     Comment:  just today (08/30/2017) No date: Lymphedema of both lower extremities No date: Morbid obesity (HCC)   HPI  Denies f/c/s, n/v/d, hemoptysis, PND, leg swelling Denies chest pain or edema  Patient presents today for an ED follow-up.  She was seen in the emergency room on 07/17/2024 for cellulitis.  She has completed antibiotics and states that she is doing better now.  She does have a history of lymphedema and we will place a referral to vascular for her today.  Overall she is doing well.    Allergies  Allergen Reactions   Bactrim  [Sulfamethoxazole -Trimethoprim ] Itching   Sulfa  Antibiotics Itching     There is no immunization history on file for this patient.  Tobacco History: Social History   Tobacco Use  Smoking Status Never  Smokeless Tobacco Never   Counseling given: Not Answered   Outpatient Encounter Medications as of 08/01/2024  Medication Sig   acetaminophen  (TYLENOL ) 500 MG tablet Take 500 mg by mouth every 6 (six) hours as needed.   albuterol (VENTOLIN HFA) 108 (90 Base) MCG/ACT inhaler Inhale 2 puffs into the lungs every 6 (six) hours as needed.   ibuprofen  (ADVIL ) 600 MG tablet Take 1 tablet (600 mg total) by mouth every 8 (eight) hours as needed.   megestrol  (MEGACE ) 40 MG tablet Take 2 tablets (80 mg total) by mouth 2 (two) times daily.   metroNIDAZOLE  (FLAGYL ) 500 MG tablet Take 1 tablet (500 mg total) by mouth 2 (two) times daily.   potassium chloride  SA (KLOR-CON  M) 20 MEQ tablet Take 1 tablet (20 mEq total) by mouth daily.   [DISCONTINUED] cephALEXin  (KEFLEX ) 500 MG capsule Take 1 capsule (500 mg total) by mouth 4 (four) times daily.    [DISCONTINUED] methocarbamol  (ROBAXIN ) 500 MG tablet Take 1 tablet (500 mg total) by mouth 2 (two) times daily. (Patient not taking: Reported on 07/08/2024)   [DISCONTINUED] naproxen  (NAPROSYN ) 500 MG tablet Take 1 tablet (500 mg total) by mouth 2 (two) times daily. (Patient not taking: Reported on 07/08/2024)   [DISCONTINUED] ofloxacin  (OCUFLOX ) 0.3 % ophthalmic solution Place 1 drop into the right eye 4 (four) times daily. (Patient not taking: Reported on 07/08/2024)   No facility-administered encounter medications on file as of 08/01/2024.    Review of Systems  Review of Systems  Constitutional: Negative.   HENT: Negative.    Cardiovascular: Negative.   Gastrointestinal: Negative.   Allergic/Immunologic: Negative.   Neurological: Negative.   Psychiatric/Behavioral: Negative.       Objective:   BP 132/81   Pulse 90   Ht 6' 2 (1.88 m)   Wt (!) 427 lb (193.7 kg)   LMP 07/29/2024 (Approximate)   BMI 54.82 kg/m   Wt Readings from Last 5 Encounters:  08/01/24 (!) 427 lb (193.7 kg)  07/28/24 (!) 424 lb (192.3 kg)  07/17/24 (!) 420 lb (190.5 kg)  07/08/24 (!) 434 lb 1.6 oz (196.9 kg)  06/18/24 (!) 420 lb (190.5 kg)     Physical Exam Vitals and nursing note reviewed.  Constitutional:      General: She is not in acute distress.    Appearance: She is well-developed.  Cardiovascular:     Rate and Rhythm: Normal rate and regular rhythm.  Pulmonary:     Effort: Pulmonary effort is normal.     Breath sounds: Normal breath sounds.  Neurological:     Mental Status: She is alert and oriented to person, place, and time.       Assessment & Plan:   Lymphedema -     Ambulatory referral to Vascular Surgery     Return in about 6 months (around 01/29/2025).   Bascom GORMAN Borer, NP 08/01/2024

## 2024-08-04 ENCOUNTER — Other Ambulatory Visit: Payer: Self-pay | Admitting: Obstetrics

## 2024-08-04 DIAGNOSIS — E876 Hypokalemia: Secondary | ICD-10-CM

## 2024-08-05 ENCOUNTER — Other Ambulatory Visit: Payer: Self-pay | Admitting: Medical Genetics

## 2024-08-13 ENCOUNTER — Ambulatory Visit: Payer: Self-pay | Admitting: Nurse Practitioner

## 2024-08-14 ENCOUNTER — Other Ambulatory Visit

## 2024-08-15 ENCOUNTER — Ambulatory Visit: Payer: Self-pay | Admitting: Nurse Practitioner

## 2024-09-02 ENCOUNTER — Ambulatory Visit: Admitting: Obstetrics and Gynecology

## 2024-09-02 ENCOUNTER — Encounter: Payer: Self-pay | Admitting: Obstetrics and Gynecology

## 2024-09-02 VITALS — BP 111/74 | HR 84 | Ht 74.0 in | Wt >= 6400 oz

## 2024-09-02 DIAGNOSIS — N939 Abnormal uterine and vaginal bleeding, unspecified: Secondary | ICD-10-CM

## 2024-09-02 DIAGNOSIS — R87811 Vaginal high risk human papillomavirus (HPV) DNA test positive: Secondary | ICD-10-CM

## 2024-09-02 DIAGNOSIS — R87620 Atypical squamous cells of undetermined significance on cytologic smear of vagina (ASC-US): Secondary | ICD-10-CM | POA: Diagnosis not present

## 2024-09-02 NOTE — Progress Notes (Signed)
 GYNECOLOGY VISIT  Patient name: [name] MRN [id number]  Date of birth: Mar 10, 1981 Chief Complaint:   Follow-up  History:  Megace  helped to stop the bleeding. Has always had irregular bleeding Will sometimes skip 2-3 months at a time. This is the first time it had been that heavy, not typically heavy when it finally resumes. Having some spotting today but otherwise has not had any heavy bleeding. No prior treatment for bleeding. No prior discussion/explanation for the irregular menses. No diagnosis of diabetes.   The following portions of the patient's history were reviewed and updated as appropriate: allergies, current medications, past family history, past medical history, past social history, past surgical history and problem list.   Health Maintenance:   Last pap     Component Value Date/Time   DIAGPAP (A) 07/08/2024 0845    - Atypical squamous cells of undetermined significance (ASC-US )   HPVHIGH Positive (A) 07/08/2024 0845   ADEQPAP  07/08/2024 0845    Satisfactory but limited for evaluation with partially obscuring blood;   ADEQPAP transformation zone component present. 07/08/2024 0845    Health Maintenance  Topic Date Due   COVID-19 Vaccine (1) Never done   Hepatitis C Screening  Never done   Hepatitis B Vaccine (1 of 3 - 19+ 3-dose series) Never done   HPV Vaccine (1 - 3-dose SCDM series) Never done   Colon Cancer Screening  Never done   Flu Shot  Never done   DTaP/Tdap/Td vaccine (1 - Tdap) 05/15/2025*   Breast Cancer Screening  07/30/2026   Pap with HPV screening  07/08/2029   HIV Screening  Completed   Pneumococcal Vaccine  Aged Out   Meningitis B Vaccine  Aged Out  *Topic was postponed. The date shown is not the original due date.      Review of Systems:  Pertinent items are noted in HPI. Comprehensive review of systems was otherwise negative.   Objective:  Physical Exam BP 111/74   Pulse 84   Ht 6' 2 (1.88 m)   Wt (!) 434 lb (196.9 kg)   LMP  09/01/2024 (Exact Date)   BMI 55.72 kg/m    Physical Exam Vitals and nursing note reviewed.  Constitutional:      Appearance: Normal appearance.  HENT:     Head: Normocephalic and atraumatic.  Pulmonary:     Effort: Pulmonary effort is normal.  Skin:    General: Skin is warm and dry.  Neurological:     General: No focal deficit present.     Mental Status: She is alert.  Psychiatric:        Mood and Affect: Mood normal.        Behavior: Behavior normal.        Thought Content: Thought content normal.        Judgment: Judgment normal.      Labs and Imaging  FINAL MICROSCOPIC DIAGNOSIS:   A. ENDOCERVIX, CURETTAGE:  Benign endocervical mucosa.      Assessment & Plan:   1. ASCUS with positive high risk human papillomavirus of vagina (Primary) Benign ECC, recommend repeat pap in 1 year  2. Abnormal uterine bleeding (AUB) Megace  controlling bleeding, continue for now for endometrial protection. Reviewed suspect/concern for anovulation. Normal A1c on file - has upcoming PCP in January, if A1c not repeated at that visit, will order due to higher dose of megace .    Carter Quarry, MD Minimally Invasive Gynecologic Surgery Center for [facility], [facility]

## 2024-09-02 NOTE — Progress Notes (Signed)
 43 y.o. GYN presents for AUB Follow Up.  The medication stopped the bleeding.

## 2024-09-22 ENCOUNTER — Encounter: Payer: Self-pay | Admitting: Radiology

## 2024-10-14 ENCOUNTER — Ambulatory Visit (HOSPITAL_COMMUNITY)
Admission: RE | Admit: 2024-10-14 | Discharge: 2024-10-14 | Disposition: A | Discharge status: Home / Self Care | Source: Ambulatory Visit | Attending: Family Medicine | Admitting: Family Medicine

## 2024-10-14 ENCOUNTER — Encounter (HOSPITAL_COMMUNITY): Payer: Self-pay

## 2024-10-14 VITALS — BP 147/79 | HR 96 | Temp 98.9°F | Resp 18

## 2024-10-14 DIAGNOSIS — K0889 Other specified disorders of teeth and supporting structures: Secondary | ICD-10-CM | POA: Diagnosis not present

## 2024-10-14 MED ORDER — AMOXICILLIN-POT CLAVULANATE 875-125 MG PO TABS
1.0000 | ORAL_TABLET | Freq: Two times a day (BID) | ORAL | 0 refills | Status: AC
Start: 2024-10-14 — End: ?

## 2024-10-14 MED ORDER — IBUPROFEN 800 MG PO TABS: 800.0000 mg | ORAL_TABLET | Freq: Three times a day (TID) | ORAL | 0 refills | Status: AC

## 2024-10-14 MED ORDER — HYDROCODONE-ACETAMINOPHEN 5-325 MG PO TABS
1.0000 | ORAL_TABLET | Freq: Four times a day (QID) | ORAL | 0 refills | Status: AC | PRN
Start: 2024-10-14 — End: ?

## 2024-10-14 NOTE — Discharge Instructions (Signed)
 Be aware, you have been prescribed pain medications that may cause drowsiness. While taking this medication, do not take any other medications containing acetaminophen (Tylenol). Do not combine with alcohol or recreational drugs. Please do not drive, operate heavy machinery, or take part in activities that require making important decisions while on this medication as your judgement may be clouded.

## 2024-10-14 NOTE — ED Triage Notes (Signed)
 Pt c/o lt upper toothache x2-3 days. Took tylenol  with no relief.

## 2024-10-14 NOTE — ED Provider Notes (Signed)
 [facility]   [phone] 10/14/24 Arrival Time: 1551  ASSESSMENT & PLAN:  1. Pain, dental    No sign of abscess requiring I&D at this time. Discussed.  Meds ordered this encounter  Medications   amoxicillin -clavulanate (AUGMENTIN ) 875-125 MG tablet    Sig: Take 1 tablet by mouth every 12 (twelve) hours.    Dispense:  14 tablet    Refill:  0   ibuprofen  (ADVIL ) 800 MG tablet    Sig: Take 1 tablet (800 mg total) by mouth 3 (three) times daily with meals.    Dispense:  21 tablet    Refill:  0   HYDROcodone -acetaminophen  (NORCO/VICODIN) 5-325 MG tablet    Sig: Take 1 tablet by mouth every 6 (six) hours as needed for moderate pain (pain score 4-6) or severe pain (pain score 7-10).    Dispense:  8 tablet    Refill:  0   [place] Controlled Substances Registry consulted for this patient. I feel the risk/benefit ratio today is favorable for proceeding with this prescription for a controlled substance. Medication sedation precautions given.   Follow-up Information     [facility].   Specialty: Urgent Care Why: If worsening or failing to improve as anticipated. Contact information: [address] [place]  [id number] [phone]                 Reviewed expectations re: course of current medical issues. Questions answered. Outlined signs and symptoms indicating need for more acute intervention. Patient verbalized understanding. After Visit Summary given.   SUBJECTIVE:  [name] is a 43 y.o. female who reports gradual onset of left upper dental pain described as aching. Present for 2-3 d days. Fever: absent. Tolerating PO intake but reports pain with chewing. Normal swallowing. She does not see a dentist regularly. No neck swelling or pain. OTC analgesics without relief.   OBJECTIVE: Vitals:   10/14/24 1625  BP: (!) 147/79  Pulse: 96  Resp: 18  Temp: 98.9 F (37.2 C)  TempSrc: Oral  SpO2: 96%    General  appearance: alert; no distress HENT: normocephalic; atraumatic; dentition: poor; left upper gumline without areas of fluctuance, drainage, or bleeding and with tenderness to palpation; normal jaw movement without difficulty Neck: supple without LAD; FROM; trachea midline Lungs: normal respirations; unlabored; speaks full sentences without difficulty Skin: warm and dry Psychological: alert and cooperative; normal mood and affect  Allergies  Allergen Reactions   Bactrim  [Sulfamethoxazole -Trimethoprim ] Itching   Sulfa  Antibiotics Itching    Past Medical History:  Diagnosis Date   Hypertension    just today (08/30/2017)   Lymphedema of both lower extremities    Morbid obesity (HCC)    Social History   Socioeconomic History   Marital status: Widowed    Spouse name: Not on file   Number of children: Not on file   Years of education: Not on file   Highest education level: Not on file  Occupational History   Not on file  Tobacco Use   Smoking status: Never   Smokeless tobacco: Never  Vaping Use   Vaping status: Never Used  Substance and Sexual Activity   Alcohol use: No   Drug use: No   Sexual activity: Not Currently    Birth control/protection: None  Other Topics Concern   Not on file  Social History Narrative   Not on file   Social Drivers of Health   Financial Resource Strain: High Risk (05/19/2024)  Overall Financial Resource Strain (CARDIA)    Difficulty of Paying Living Expenses: Hard  Food Insecurity: Food Insecurity Present (05/19/2024)   Hunger Vital Sign    Worried About Running Out of Food in the Last Year: Sometimes true    Ran Out of Food in the Last Year: Sometimes true  Transportation Needs: No Transportation Needs (05/19/2024)   PRAPARE - Administrator, Civil Service (Medical): No    Lack of Transportation (Non-Medical): No  Physical Activity: Not on file  Stress: Not on file  Social Connections: Not on file  Intimate Partner Violence:  Not on file   Family History  Problem Relation Age of Onset   Renal Disease Mother    Breast cancer Maternal Grandmother    Past Surgical History:  Procedure Laterality Date   NO PAST SURGERIES        Rolinda Rogue, MD 10/14/24 1752

## 2024-10-27 ENCOUNTER — Encounter: Payer: Self-pay | Admitting: Obstetrics and Gynecology

## 2024-10-27 ENCOUNTER — Other Ambulatory Visit: Payer: Self-pay

## 2024-10-27 DIAGNOSIS — N939 Abnormal uterine and vaginal bleeding, unspecified: Secondary | ICD-10-CM

## 2024-10-28 ENCOUNTER — Other Ambulatory Visit: Payer: Self-pay

## 2024-10-28 DIAGNOSIS — I890 Lymphedema, not elsewhere classified: Secondary | ICD-10-CM

## 2024-10-28 MED ORDER — NORETHINDRONE 0.35 MG PO TABS: 1.0000 | ORAL_TABLET | Freq: Every day | ORAL | 11 refills | Status: AC

## 2024-10-31 ENCOUNTER — Ambulatory Visit: Payer: Self-pay | Admitting: Nurse Practitioner

## 2024-12-01 ENCOUNTER — Ambulatory Visit: Payer: Self-pay | Admitting: Nurse Practitioner

## 2024-12-02 ENCOUNTER — Other Ambulatory Visit: Payer: Self-pay | Admitting: Medical Genetics

## 2024-12-02 ENCOUNTER — Ambulatory Visit (HOSPITAL_COMMUNITY)

## 2024-12-02 DIAGNOSIS — Z006 Encounter for examination for normal comparison and control in clinical research program: Secondary | ICD-10-CM

## 2024-12-02 NOTE — Progress Notes (Unsigned)
 "        [facility]           OF [place]  History and Physical   [name] is a 44 y.o. female who presents with ***  ***  The pt does *** have hx of previous venous procedures. The patient has *** history of DVT. Pt does *** history of varicose vein.   Pt does *** history of skin changes in lower legs.   There is *** family history of venous disorders.   The patient has *** used compression stockings in the past.    The pt is not on a statin for cholesterol management.  The pt is not on a daily aspirin.   Other AC:  none The pt is not on medication for hypertension.   The pt is not on medication for diabetes.   Tobacco hx:  never  Pt does *** have family hx of AAA.  Past Medical History:  Diagnosis Date   Hypertension    just today (08/30/2017)   Lymphedema of both lower extremities    Morbid obesity (HCC)     Past Surgical History:  Procedure Laterality Date   NO PAST SURGERIES      Social History   Socioeconomic History   Marital status: Widowed    Spouse name: Not on file   Number of children: Not on file   Years of education: Not on file   Highest education level: Not on file  Occupational History   Not on file  Tobacco Use   Smoking status: Never   Smokeless tobacco: Never  Vaping Use   Vaping status: Never Used  Substance and Sexual Activity   Alcohol use: No   Drug use: No   Sexual activity: Not Currently    Birth control/protection: None  Other Topics Concern   Not on file  Social History Narrative   Not on file   Social Drivers of Health   Tobacco Use: Low Risk (10/14/2024)   Patient History    Smoking Tobacco Use: Never    Smokeless Tobacco Use: Never    Passive Exposure: Not on file  Financial Resource Strain: High Risk (05/19/2024)   Overall Financial Resource Strain (CARDIA)    Difficulty of Paying Living Expenses: Hard  Food Insecurity: Food Insecurity Present (05/19/2024)   Epic    Worried About  Programme Researcher, Broadcasting/film/video in the Last Year: Sometimes true    Ran Out of Food in the Last Year: Sometimes true  Transportation Needs: No Transportation Needs (05/19/2024)   Epic    Lack of Transportation (Medical): No    Lack of Transportation (Non-Medical): No  Physical Activity: Not on file  Stress: Not on file  Social Connections: Not on file  Intimate Partner Violence: Not on file  Depression (PHQ2-9): Medium Risk (07/08/2024)   Depression (PHQ2-9)    PHQ-2 Score: 6  Alcohol Screen: Not on file  Housing: High Risk (05/19/2024)   Epic    Unable to Pay for Housing in the Last Year: Yes    Number of Times Moved in the Last Year: Not on file    Homeless in the Last Year: Yes  Utilities: Not At Risk (05/19/2024)   Epic    Threatened with loss of utilities: No  Health Literacy: Not on file    *** Family History  Problem Relation Age of Onset   Renal Disease Mother    Breast cancer Maternal Grandmother  Current Outpatient Medications  Medication Sig Dispense Refill   albuterol (VENTOLIN HFA) 108 (90 Base) MCG/ACT inhaler Inhale 2 puffs into the lungs every 6 (six) hours as needed.     amoxicillin -clavulanate (AUGMENTIN ) 875-125 MG tablet Take 1 tablet by mouth every 12 (twelve) hours. 14 tablet 0   HYDROcodone -acetaminophen  (NORCO/VICODIN) 5-325 MG tablet Take 1 tablet by mouth every 6 (six) hours as needed for moderate pain (pain score 4-6) or severe pain (pain score 7-10). 8 tablet 0   ibuprofen  (ADVIL ) 800 MG tablet Take 1 tablet (800 mg total) by mouth 3 (three) times daily with meals. 21 tablet 0   norethindrone  (NORLYDA ) 0.35 MG tablet Take 1 tablet (0.35 mg total) by mouth daily. 28 tablet 11   No current facility-administered medications for this visit.    Allergies[1]  REVIEW OF SYSTEMS:  *** [X]  denotes positive finding, [ ]  denotes negative finding Cardiac  Comments:  Chest pain or chest pressure:    Shortness of breath upon exertion:    Short of breath when lying  flat:    Irregular heart rhythm:        Vascular    Pain in calf, thigh, or hip brought on by ambulation:    Pain in feet at night that wakes you up from your sleep:     Blood clot in your veins:    Leg swelling:  x       Pulmonary    Oxygen at home:    Productive cough:     Wheezing:         Neurologic    Sudden weakness in arms or legs:     Sudden numbness in arms or legs:     Sudden onset of difficulty speaking or slurred speech:    Temporary loss of vision in one eye:     Problems with dizziness:         Gastrointestinal    Blood in stool:     Vomited blood:         Genitourinary    Burning when urinating:     Blood in urine:        Psychiatric    Major depression:         Hematologic    Bleeding problems:    Problems with blood clotting too easily:        Skin    Rashes or ulcers:        Constitutional    Fever or chills:      PHYSICAL EXAMINATION:  ***  General:  WDWN in NAD; vital signs documented above Gait: Not observed HENT: WNL, normocephalic Pulmonary: normal non-labored breathing without wheezing Cardiac: {Desc; regular/irreg:14544} HR; {With/Without:20273} carotid bruit*** Abdomen: soft, NT, aortic pulse is *** palpable Skin: {With/Without:20273} rashes Vascular Exam/Pulses:  Right Left  Radial {Exam; arterial pulse strength 0-4:30167} {Exam; arterial pulse strength 0-4:30167}  DP {Exam; arterial pulse strength 0-4:30167} {Exam; arterial pulse strength 0-4:30167}  PT {Exam; arterial pulse strength 0-4:30167} {Exam; arterial pulse strength 0-4:30167}   Extremities: ***  Neurologic: A&O X 3;  moving all extremities equally Psychiatric:  The pt has {Desc; normal/abnormal:11317::Normal} affect.   Non-Invasive Vascular Imaging:   Venous duplex on 12/02/2024: ***    [name] is a 44 y.o. female who presents with: ***    -pt has *** pedal pulses -in the *** lower extremity, the pt does *** have evidence of DVT.  Pt does ***have  venous reflux *** -discussed with pt about wearing ***  high *** mmHg compression stockings and pt was measured for these today.   *** -discussed the importance of leg elevation and how to elevate properly - pt is advised to elevate their legs and a diagram is given to them to demonstrate for pt to lay flat on their back with knees elevated and slightly bent with their feet higher than their knees, which puts their feet higher than their heart for 15 minutes per day.  If pt cannot lay flat, advised to lay as flat as possible.  -pt is advised to continue as much walking as possible and avoid sitting or standing for long periods of time.  -discussed importance of maintaining a healthy weight and exercise and that water  aerobics would also be beneficial.  -handout with recommendations given -pt will f/u ***   Lucie Apt, [facility] [phone]  Clinic MD:  Gretta    [1]  Allergies Allergen Reactions   Bactrim  [Sulfamethoxazole -Trimethoprim ] Itching   Sulfa  Antibiotics Itching   "

## 2024-12-15 ENCOUNTER — Telehealth: Payer: Self-pay | Admitting: Obstetrics and Gynecology

## 2024-12-15 DIAGNOSIS — N938 Other specified abnormal uterine and vaginal bleeding: Secondary | ICD-10-CM | POA: Diagnosis not present

## 2024-12-15 DIAGNOSIS — N939 Abnormal uterine and vaginal bleeding, unspecified: Secondary | ICD-10-CM

## 2024-12-17 NOTE — Progress Notes (Signed)
 CALLED PATIENT AND CONFIRMED ID [phone]  Called patient to review bleeding management with norethindrone  0.35mg . New medication is going well. Spottingfor a few days. Random occurrence of bleeding. No pain either type of bleeding that occurs. No concerns at this time.  Will be due for pap 07/2025.   AUB well controlled with norethindrone , and will continue with this management  Carter Quarry, MD, FACOG Minimally Invasive Gynecologic Surgery  Obstetrics and Gynecology, [facility] for [facility], [facility] 12/22/2024

## 2024-12-21 LAB — GENECONNECT MOLECULAR SCREEN: Genetic Analysis Overall Interpretation: NEGATIVE

## 2025-01-05 ENCOUNTER — Ambulatory Visit: Admitting: Nurse Practitioner

## 2025-04-21 ENCOUNTER — Encounter: Admitting: Vascular Surgery

## 2025-04-21 ENCOUNTER — Ambulatory Visit (HOSPITAL_COMMUNITY)
